# Patient Record
Sex: Female | Born: 1960
Health system: Southern US, Community
[De-identification: ages and names within clinical notes are randomized; demographics above are authoritative.]

## PROBLEM LIST (undated history)

## (undated) DIAGNOSIS — J45909 Unspecified asthma, uncomplicated: Secondary | ICD-10-CM

## (undated) DIAGNOSIS — D696 Thrombocytopenia, unspecified: Secondary | ICD-10-CM

## (undated) DIAGNOSIS — K922 Gastrointestinal hemorrhage, unspecified: Secondary | ICD-10-CM

## (undated) DIAGNOSIS — I2699 Other pulmonary embolism without acute cor pulmonale: Secondary | ICD-10-CM

## (undated) DIAGNOSIS — K219 Gastro-esophageal reflux disease without esophagitis: Secondary | ICD-10-CM

## (undated) DIAGNOSIS — F419 Anxiety disorder, unspecified: Secondary | ICD-10-CM

## (undated) DIAGNOSIS — E785 Hyperlipidemia, unspecified: Secondary | ICD-10-CM

## (undated) DIAGNOSIS — I82409 Acute embolism and thrombosis of unspecified deep veins of unspecified lower extremity: Secondary | ICD-10-CM

## (undated) DIAGNOSIS — A0472 Enterocolitis due to Clostridium difficile, not specified as recurrent: Secondary | ICD-10-CM

## (undated) DIAGNOSIS — M1990 Unspecified osteoarthritis, unspecified site: Secondary | ICD-10-CM

## (undated) DIAGNOSIS — M545 Low back pain, unspecified: Secondary | ICD-10-CM

## (undated) DIAGNOSIS — G629 Polyneuropathy, unspecified: Secondary | ICD-10-CM

## (undated) DIAGNOSIS — K5090 Crohn's disease, unspecified, without complications: Secondary | ICD-10-CM

## (undated) DIAGNOSIS — H409 Unspecified glaucoma: Secondary | ICD-10-CM

## (undated) DIAGNOSIS — Z720 Tobacco use: Secondary | ICD-10-CM

## (undated) DIAGNOSIS — E039 Hypothyroidism, unspecified: Secondary | ICD-10-CM

## (undated) DIAGNOSIS — I10 Essential (primary) hypertension: Secondary | ICD-10-CM

## (undated) DIAGNOSIS — E119 Type 2 diabetes mellitus without complications: Secondary | ICD-10-CM

## (undated) DIAGNOSIS — R7989 Other specified abnormal findings of blood chemistry: Secondary | ICD-10-CM

## (undated) DIAGNOSIS — G8929 Other chronic pain: Secondary | ICD-10-CM

## (undated) DIAGNOSIS — K859 Acute pancreatitis without necrosis or infection, unspecified: Secondary | ICD-10-CM

---

## 2000-10-10 ENCOUNTER — Encounter: Payer: Self-pay | Admitting: Family Medicine

## 2000-10-10 ENCOUNTER — Ambulatory Visit (HOSPITAL_COMMUNITY): Admission: RE | Admit: 2000-10-10 | Discharge: 2000-10-10 | Payer: Self-pay | Admitting: Family Medicine

## 2000-10-11 ENCOUNTER — Inpatient Hospital Stay (HOSPITAL_COMMUNITY): Admission: EM | Admit: 2000-10-11 | Discharge: 2000-10-12 | Payer: Self-pay | Admitting: Emergency Medicine

## 2000-10-12 ENCOUNTER — Encounter: Payer: Self-pay | Admitting: *Deleted

## 2002-01-15 ENCOUNTER — Ambulatory Visit (HOSPITAL_COMMUNITY): Admission: RE | Admit: 2002-01-15 | Discharge: 2002-01-15 | Payer: Self-pay | Admitting: Family Medicine

## 2002-09-01 ENCOUNTER — Encounter: Payer: Self-pay | Admitting: *Deleted

## 2002-09-01 ENCOUNTER — Emergency Department (HOSPITAL_COMMUNITY): Admission: EM | Admit: 2002-09-01 | Discharge: 2002-09-01 | Payer: Self-pay | Admitting: *Deleted

## 2002-11-09 ENCOUNTER — Encounter: Payer: Self-pay | Admitting: Emergency Medicine

## 2002-11-10 ENCOUNTER — Encounter: Payer: Self-pay | Admitting: Emergency Medicine

## 2002-11-10 ENCOUNTER — Inpatient Hospital Stay (HOSPITAL_COMMUNITY): Admission: EM | Admit: 2002-11-10 | Discharge: 2002-11-14 | Payer: Self-pay | Admitting: Emergency Medicine

## 2002-11-12 ENCOUNTER — Encounter: Payer: Self-pay | Admitting: *Deleted

## 2002-11-12 ENCOUNTER — Encounter (INDEPENDENT_AMBULATORY_CARE_PROVIDER_SITE_OTHER): Payer: Self-pay | Admitting: Specialist

## 2002-12-21 ENCOUNTER — Encounter: Payer: Self-pay | Admitting: Emergency Medicine

## 2002-12-22 ENCOUNTER — Encounter: Payer: Self-pay | Admitting: *Deleted

## 2002-12-22 ENCOUNTER — Inpatient Hospital Stay (HOSPITAL_COMMUNITY): Admission: EM | Admit: 2002-12-22 | Discharge: 2003-01-02 | Payer: Self-pay | Admitting: Emergency Medicine

## 2002-12-24 ENCOUNTER — Encounter: Payer: Self-pay | Admitting: *Deleted

## 2003-01-18 ENCOUNTER — Ambulatory Visit (HOSPITAL_COMMUNITY): Admission: RE | Admit: 2003-01-18 | Discharge: 2003-01-18 | Payer: Self-pay | Admitting: Family Medicine

## 2003-02-03 ENCOUNTER — Encounter: Admission: RE | Admit: 2003-02-03 | Discharge: 2003-02-03 | Payer: Self-pay | Admitting: Family Medicine

## 2003-02-03 ENCOUNTER — Encounter: Payer: Self-pay | Admitting: Family Medicine

## 2003-03-26 ENCOUNTER — Encounter: Payer: Self-pay | Admitting: Cardiology

## 2003-03-26 ENCOUNTER — Ambulatory Visit (HOSPITAL_COMMUNITY): Admission: RE | Admit: 2003-03-26 | Discharge: 2003-03-26 | Payer: Self-pay | Admitting: Internal Medicine

## 2003-08-18 ENCOUNTER — Other Ambulatory Visit: Admission: RE | Admit: 2003-08-18 | Discharge: 2003-08-18 | Payer: Self-pay | Admitting: Family Medicine

## 2003-09-10 ENCOUNTER — Encounter: Admission: RE | Admit: 2003-09-10 | Discharge: 2003-09-10 | Payer: Self-pay | Admitting: Family Medicine

## 2003-09-30 ENCOUNTER — Inpatient Hospital Stay (HOSPITAL_COMMUNITY): Admission: EM | Admit: 2003-09-30 | Discharge: 2003-10-05 | Payer: Self-pay | Admitting: Emergency Medicine

## 2003-10-26 ENCOUNTER — Inpatient Hospital Stay (HOSPITAL_COMMUNITY): Admission: EM | Admit: 2003-10-26 | Discharge: 2003-11-02 | Payer: Self-pay | Admitting: *Deleted

## 2003-11-22 ENCOUNTER — Emergency Department (HOSPITAL_COMMUNITY): Admission: EM | Admit: 2003-11-22 | Discharge: 2003-11-23 | Payer: Self-pay | Admitting: Emergency Medicine

## 2003-12-21 ENCOUNTER — Encounter: Admission: RE | Admit: 2003-12-21 | Discharge: 2004-03-20 | Payer: Self-pay | Admitting: *Deleted

## 2004-05-24 ENCOUNTER — Inpatient Hospital Stay (HOSPITAL_COMMUNITY): Admission: EM | Admit: 2004-05-24 | Discharge: 2004-06-07 | Payer: Self-pay | Admitting: Emergency Medicine

## 2004-05-29 ENCOUNTER — Encounter (INDEPENDENT_AMBULATORY_CARE_PROVIDER_SITE_OTHER): Payer: Self-pay | Admitting: Specialist

## 2004-06-16 ENCOUNTER — Ambulatory Visit: Payer: Self-pay | Admitting: Nurse Practitioner

## 2004-10-24 ENCOUNTER — Ambulatory Visit: Payer: Self-pay | Admitting: Nurse Practitioner

## 2005-01-24 ENCOUNTER — Ambulatory Visit: Payer: Self-pay | Admitting: Nurse Practitioner

## 2005-01-29 ENCOUNTER — Other Ambulatory Visit: Admission: RE | Admit: 2005-01-29 | Discharge: 2005-01-29 | Payer: Self-pay | Admitting: Family Medicine

## 2005-01-29 ENCOUNTER — Ambulatory Visit: Payer: Self-pay | Admitting: Nurse Practitioner

## 2005-02-19 ENCOUNTER — Ambulatory Visit (HOSPITAL_COMMUNITY): Admission: RE | Admit: 2005-02-19 | Discharge: 2005-02-19 | Payer: Self-pay | Admitting: Family Medicine

## 2005-03-28 ENCOUNTER — Ambulatory Visit: Payer: Self-pay | Admitting: Nurse Practitioner

## 2005-07-04 ENCOUNTER — Encounter: Admission: RE | Admit: 2005-07-04 | Discharge: 2005-07-04 | Payer: Self-pay | Admitting: Gastroenterology

## 2005-07-26 ENCOUNTER — Encounter: Admission: RE | Admit: 2005-07-26 | Discharge: 2005-07-26 | Payer: Self-pay | Admitting: Gastroenterology

## 2005-10-31 ENCOUNTER — Ambulatory Visit: Payer: Self-pay | Admitting: Nurse Practitioner

## 2005-12-14 ENCOUNTER — Ambulatory Visit: Payer: Self-pay | Admitting: Nurse Practitioner

## 2005-12-17 ENCOUNTER — Ambulatory Visit: Payer: Self-pay | Admitting: Nurse Practitioner

## 2005-12-21 ENCOUNTER — Ambulatory Visit: Payer: Self-pay | Admitting: Nurse Practitioner

## 2006-02-27 ENCOUNTER — Ambulatory Visit: Payer: Self-pay | Admitting: Internal Medicine

## 2006-03-05 ENCOUNTER — Ambulatory Visit: Payer: Self-pay | Admitting: Nurse Practitioner

## 2006-03-07 ENCOUNTER — Ambulatory Visit: Payer: Self-pay | Admitting: Nurse Practitioner

## 2006-04-16 ENCOUNTER — Ambulatory Visit: Payer: Self-pay | Admitting: Nurse Practitioner

## 2006-04-16 ENCOUNTER — Emergency Department (HOSPITAL_COMMUNITY): Admission: EM | Admit: 2006-04-16 | Discharge: 2006-04-17 | Payer: Self-pay | Admitting: Emergency Medicine

## 2006-11-25 ENCOUNTER — Ambulatory Visit: Payer: Self-pay | Admitting: Nurse Practitioner

## 2007-02-21 ENCOUNTER — Encounter (INDEPENDENT_AMBULATORY_CARE_PROVIDER_SITE_OTHER): Payer: Self-pay | Admitting: Nurse Practitioner

## 2007-02-21 ENCOUNTER — Ambulatory Visit: Payer: Self-pay | Admitting: Internal Medicine

## 2007-02-21 ENCOUNTER — Other Ambulatory Visit: Admission: RE | Admit: 2007-02-21 | Discharge: 2007-02-21 | Payer: Self-pay | Admitting: Internal Medicine

## 2007-04-12 ENCOUNTER — Inpatient Hospital Stay (HOSPITAL_COMMUNITY): Admission: EM | Admit: 2007-04-12 | Discharge: 2007-04-16 | Payer: Self-pay | Admitting: Emergency Medicine

## 2007-04-17 ENCOUNTER — Ambulatory Visit: Payer: Self-pay | Admitting: Family Medicine

## 2007-04-18 ENCOUNTER — Ambulatory Visit: Payer: Self-pay | Admitting: Internal Medicine

## 2007-04-18 ENCOUNTER — Ambulatory Visit (HOSPITAL_COMMUNITY): Admission: RE | Admit: 2007-04-18 | Discharge: 2007-04-18 | Payer: Self-pay | Admitting: Internal Medicine

## 2007-05-12 DIAGNOSIS — F32A Depression, unspecified: Secondary | ICD-10-CM | POA: Insufficient documentation

## 2007-05-12 DIAGNOSIS — K7460 Unspecified cirrhosis of liver: Secondary | ICD-10-CM | POA: Insufficient documentation

## 2007-05-12 DIAGNOSIS — J309 Allergic rhinitis, unspecified: Secondary | ICD-10-CM | POA: Insufficient documentation

## 2007-05-12 DIAGNOSIS — F329 Major depressive disorder, single episode, unspecified: Secondary | ICD-10-CM | POA: Insufficient documentation

## 2007-05-12 DIAGNOSIS — D509 Iron deficiency anemia, unspecified: Secondary | ICD-10-CM

## 2007-05-12 DIAGNOSIS — J45909 Unspecified asthma, uncomplicated: Secondary | ICD-10-CM | POA: Insufficient documentation

## 2007-05-12 DIAGNOSIS — G629 Polyneuropathy, unspecified: Secondary | ICD-10-CM

## 2007-05-12 DIAGNOSIS — I10 Essential (primary) hypertension: Secondary | ICD-10-CM | POA: Insufficient documentation

## 2007-05-12 DIAGNOSIS — Z8719 Personal history of other diseases of the digestive system: Secondary | ICD-10-CM | POA: Insufficient documentation

## 2007-05-12 DIAGNOSIS — E118 Type 2 diabetes mellitus with unspecified complications: Secondary | ICD-10-CM | POA: Insufficient documentation

## 2007-05-12 DIAGNOSIS — F411 Generalized anxiety disorder: Secondary | ICD-10-CM | POA: Insufficient documentation

## 2007-06-17 ENCOUNTER — Encounter (INDEPENDENT_AMBULATORY_CARE_PROVIDER_SITE_OTHER): Payer: Self-pay | Admitting: Nurse Practitioner

## 2007-06-17 ENCOUNTER — Ambulatory Visit: Payer: Self-pay | Admitting: Internal Medicine

## 2007-06-19 ENCOUNTER — Ambulatory Visit: Payer: Self-pay | Admitting: Internal Medicine

## 2008-01-26 ENCOUNTER — Encounter (INDEPENDENT_AMBULATORY_CARE_PROVIDER_SITE_OTHER): Payer: Self-pay | Admitting: Nurse Practitioner

## 2008-01-26 ENCOUNTER — Ambulatory Visit: Payer: Self-pay | Admitting: Internal Medicine

## 2008-01-26 LAB — CONVERTED CEMR LAB
AST: 18 units/L (ref 0–37)
BUN: 16 mg/dL (ref 6–23)
Basophils Absolute: 0 10*3/uL (ref 0.0–0.1)
Calcium: 9.6 mg/dL (ref 8.4–10.5)
Creatinine, Ser: 0.82 mg/dL (ref 0.40–1.20)
Eosinophils Absolute: 0.4 10*3/uL (ref 0.0–0.7)
HCT: 38.3 % (ref 36.0–46.0)
HDL: 51 mg/dL (ref >39)
Hemoglobin: 12.4 g/dL (ref 12.0–15.0)
Lymphocytes Relative: 31 % (ref 12–46)
MCHC: 32.4 g/dL (ref 30.0–36.0)
MCV: 92.3 fL (ref 78.0–100.0)
Monocytes Absolute: 0.7 10*3/uL (ref 0.1–1.0)
Neutrophils Relative %: 61 % (ref 43–77)
Platelets: 332 10*3/uL (ref 150–400)
RBC: 4.15 M/uL (ref 3.87–5.11)
RDW: 14.5 % (ref 11.5–15.5)
Sodium: 142 meq/L (ref 135–145)
Total Bilirubin: 0.4 mg/dL (ref 0.3–1.2)
Total CHOL/HDL Ratio: 3.1
Triglycerides: 115 mg/dL (ref <150)
VLDL: 23 mg/dL (ref 0–40)

## 2008-08-11 ENCOUNTER — Ambulatory Visit (HOSPITAL_COMMUNITY): Admission: RE | Admit: 2008-08-11 | Discharge: 2008-08-11 | Payer: Self-pay | Admitting: Interventional Cardiology

## 2008-12-17 ENCOUNTER — Inpatient Hospital Stay (HOSPITAL_COMMUNITY): Admission: AD | Admit: 2008-12-17 | Discharge: 2008-12-22 | Payer: Self-pay | Admitting: Gastroenterology

## 2009-03-26 ENCOUNTER — Inpatient Hospital Stay (HOSPITAL_COMMUNITY): Admission: EM | Admit: 2009-03-26 | Discharge: 2009-03-31 | Payer: Self-pay | Admitting: Emergency Medicine

## 2009-05-02 ENCOUNTER — Encounter (INDEPENDENT_AMBULATORY_CARE_PROVIDER_SITE_OTHER): Payer: Self-pay | Admitting: Internal Medicine

## 2009-05-02 ENCOUNTER — Ambulatory Visit: Payer: Self-pay | Admitting: Family Medicine

## 2009-05-02 LAB — CONVERTED CEMR LAB
Albumin: 3.9 g/dL (ref 3.5–5.2)
Alkaline Phosphatase: 92 units/L (ref 39–117)
BUN: 10 mg/dL (ref 6–23)
CO2: 24 meq/L (ref 19–32)
Calcium: 9.2 mg/dL (ref 8.4–10.5)
Sodium: 139 meq/L (ref 135–145)
Total Bilirubin: 0.4 mg/dL (ref 0.3–1.2)

## 2009-10-13 ENCOUNTER — Ambulatory Visit: Payer: Self-pay | Admitting: Internal Medicine

## 2009-10-18 ENCOUNTER — Emergency Department (HOSPITAL_COMMUNITY): Admission: EM | Admit: 2009-10-18 | Discharge: 2009-10-18 | Payer: Self-pay | Admitting: Emergency Medicine

## 2010-01-23 ENCOUNTER — Ambulatory Visit: Payer: Self-pay | Admitting: Internal Medicine

## 2010-08-02 ENCOUNTER — Encounter (INDEPENDENT_AMBULATORY_CARE_PROVIDER_SITE_OTHER): Payer: Self-pay | Admitting: Internal Medicine

## 2010-08-02 LAB — CONVERTED CEMR LAB
Basophils Absolute: 0.1 10*3/uL (ref 0.0–0.1)
Basophils Relative: 0 % (ref 0–1)
Eosinophils Absolute: 0.2 10*3/uL (ref 0.0–0.7)
Eosinophils Relative: 2 % (ref 0–5)
Lymphs Abs: 4.1 10*3/uL — ABNORMAL HIGH (ref 0.7–4.0)
MCHC: 31.5 g/dL (ref 30.0–36.0)
Monocytes Absolute: 0.7 10*3/uL (ref 0.1–1.0)
Neutro Abs: 8.7 10*3/uL — ABNORMAL HIGH (ref 1.7–7.7)
Platelets: 384 10*3/uL (ref 150–400)
RDW: 16 % — ABNORMAL HIGH (ref 11.5–15.5)
Sed Rate: 14 mm/hr (ref 0–22)
WBC: 13.7 10*3/uL — ABNORMAL HIGH (ref 4.0–10.5)

## 2010-10-22 ENCOUNTER — Encounter: Payer: Self-pay | Admitting: Otolaryngology

## 2010-10-22 ENCOUNTER — Encounter: Payer: Self-pay | Admitting: Internal Medicine

## 2010-10-22 ENCOUNTER — Encounter: Payer: Self-pay | Admitting: Orthopedic Surgery

## 2010-11-04 ENCOUNTER — Encounter: Payer: Self-pay | Admitting: Gastroenterology

## 2010-11-11 ENCOUNTER — Emergency Department (HOSPITAL_COMMUNITY)
Admission: EM | Admit: 2010-11-11 | Discharge: 2010-11-12 | Disposition: A | Discharge status: Home / Self Care | Payer: Medicaid Other | Attending: Emergency Medicine | Admitting: Emergency Medicine

## 2010-11-11 ENCOUNTER — Emergency Department (HOSPITAL_COMMUNITY): Payer: Medicaid Other

## 2010-11-11 DIAGNOSIS — G8929 Other chronic pain: Secondary | ICD-10-CM | POA: Insufficient documentation

## 2010-11-11 DIAGNOSIS — Y92009 Unspecified place in unspecified non-institutional (private) residence as the place of occurrence of the external cause: Secondary | ICD-10-CM | POA: Insufficient documentation

## 2010-11-11 DIAGNOSIS — W19XXXA Unspecified fall, initial encounter: Secondary | ICD-10-CM | POA: Insufficient documentation

## 2010-11-11 DIAGNOSIS — S20229A Contusion of unspecified back wall of thorax, initial encounter: Secondary | ICD-10-CM | POA: Insufficient documentation

## 2010-11-11 DIAGNOSIS — I10 Essential (primary) hypertension: Secondary | ICD-10-CM | POA: Insufficient documentation

## 2010-11-11 DIAGNOSIS — E119 Type 2 diabetes mellitus without complications: Secondary | ICD-10-CM | POA: Insufficient documentation

## 2010-11-11 DIAGNOSIS — Z79899 Other long term (current) drug therapy: Secondary | ICD-10-CM | POA: Insufficient documentation

## 2010-11-11 DIAGNOSIS — F17200 Nicotine dependence, unspecified, uncomplicated: Secondary | ICD-10-CM | POA: Insufficient documentation

## 2010-11-11 LAB — CBC
HCT: 45.6 % (ref 36.0–46.0)
Hemoglobin: 15.8 g/dL — ABNORMAL HIGH (ref 12.0–15.0)
MCV: 87.9 fL (ref 78.0–100.0)
RBC: 5.19 MIL/uL — ABNORMAL HIGH (ref 3.87–5.11)
WBC: 14.8 10*3/uL — ABNORMAL HIGH (ref 4.0–10.5)

## 2010-11-11 LAB — DIFFERENTIAL
Basophils Absolute: 0.1 10*3/uL (ref 0.0–0.1)
Lymphocytes Relative: 23 % (ref 12–46)
Lymphs Abs: 3.4 10*3/uL (ref 0.7–4.0)
Neutro Abs: 9.8 10*3/uL — ABNORMAL HIGH (ref 1.7–7.7)
Neutrophils Relative %: 66 % (ref 43–77)

## 2010-11-11 LAB — BASIC METABOLIC PANEL
CO2: 23 mEq/L (ref 19–32)
GFR calc Af Amer: 34 mL/min — ABNORMAL LOW (ref >60)
Glucose, Bld: 162 mg/dL — ABNORMAL HIGH (ref 70–99)
Potassium: 3.7 mEq/L (ref 3.5–5.1)
Sodium: 137 mEq/L (ref 135–145)

## 2010-12-17 LAB — DIFFERENTIAL
Basophils Absolute: 0 10*3/uL (ref 0.0–0.1)
Eosinophils Relative: 0 % (ref 0–5)
Lymphocytes Relative: 10 % — ABNORMAL LOW (ref 12–46)
Lymphs Abs: 1.4 10*3/uL (ref 0.7–4.0)
Monocytes Absolute: 0.3 10*3/uL (ref 0.1–1.0)
Monocytes Relative: 2 % — ABNORMAL LOW (ref 3–12)
Neutro Abs: 12.5 10*3/uL — ABNORMAL HIGH (ref 1.7–7.7)

## 2010-12-17 LAB — URINALYSIS, ROUTINE W REFLEX MICROSCOPIC
Hgb urine dipstick: NEGATIVE
Nitrite: NEGATIVE
Specific Gravity, Urine: 1.021 (ref 1.005–1.030)
Urobilinogen, UA: 0.2 mg/dL (ref 0.0–1.0)
pH: 5.5 (ref 5.0–8.0)

## 2010-12-17 LAB — POCT CARDIAC MARKERS
CKMB, poc: 4 ng/mL (ref 1.0–8.0)
CKMB, poc: 5.6 ng/mL (ref 1.0–8.0)
Myoglobin, poc: 319 ng/mL (ref 12–200)
Myoglobin, poc: 432 ng/mL (ref 12–200)
Troponin i, poc: <0.05 ng/mL (ref 0.00–0.09)

## 2010-12-17 LAB — CBC
HCT: 42.8 % (ref 36.0–46.0)
MCV: 91 fL (ref 78.0–100.0)
Platelets: 295 10*3/uL (ref 150–400)
RDW: 14.4 % (ref 11.5–15.5)
WBC: 14.2 10*3/uL — ABNORMAL HIGH (ref 4.0–10.5)

## 2010-12-17 LAB — URINE MICROSCOPIC-ADD ON

## 2010-12-17 LAB — COMPREHENSIVE METABOLIC PANEL
AST: 27 U/L (ref 0–37)
Albumin: 4.7 g/dL (ref 3.5–5.2)
BUN: 24 mg/dL — ABNORMAL HIGH (ref 6–23)
Calcium: 9.6 mg/dL (ref 8.4–10.5)
Chloride: 97 mEq/L (ref 96–112)
Creatinine, Ser: 1.11 mg/dL (ref 0.4–1.2)
GFR calc Af Amer: >60 mL/min (ref >60)
Total Protein: 8.9 g/dL — ABNORMAL HIGH (ref 6.0–8.3)

## 2011-01-07 LAB — GLUCOSE, CAPILLARY: Glucose-Capillary: 108 mg/dL — ABNORMAL HIGH (ref 70–99)

## 2011-01-08 LAB — CBC
HCT: 30.5 % — ABNORMAL LOW (ref 36.0–46.0)
HCT: 33.2 % — ABNORMAL LOW (ref 36.0–46.0)
HCT: 33.3 % — ABNORMAL LOW (ref 36.0–46.0)
HCT: 40.2 % (ref 36.0–46.0)
Hemoglobin: 10.9 g/dL — ABNORMAL LOW (ref 12.0–15.0)
Hemoglobin: 11.1 g/dL — ABNORMAL LOW (ref 12.0–15.0)
MCHC: 33.2 g/dL (ref 30.0–36.0)
MCV: 90.8 fL (ref 78.0–100.0)
MCV: 91.7 fL (ref 78.0–100.0)
Platelets: 269 10*3/uL (ref 150–400)
Platelets: 334 10*3/uL (ref 150–400)
RBC: 3.33 MIL/uL — ABNORMAL LOW (ref 3.87–5.11)
RBC: 3.64 MIL/uL — ABNORMAL LOW (ref 3.87–5.11)
RBC: 3.66 MIL/uL — ABNORMAL LOW (ref 3.87–5.11)
RBC: 4.38 MIL/uL (ref 3.87–5.11)
WBC: 12.4 10*3/uL — ABNORMAL HIGH (ref 4.0–10.5)
WBC: 7.7 10*3/uL (ref 4.0–10.5)
WBC: 9.3 10*3/uL (ref 4.0–10.5)

## 2011-01-08 LAB — URINALYSIS, ROUTINE W REFLEX MICROSCOPIC
Glucose, UA: NEGATIVE mg/dL
Ketones, ur: 15 mg/dL — AB
pH: 5 (ref 5.0–8.0)

## 2011-01-08 LAB — GLUCOSE, CAPILLARY
Glucose-Capillary: 111 mg/dL — ABNORMAL HIGH (ref 70–99)
Glucose-Capillary: 115 mg/dL — ABNORMAL HIGH (ref 70–99)
Glucose-Capillary: 125 mg/dL — ABNORMAL HIGH (ref 70–99)
Glucose-Capillary: 157 mg/dL — ABNORMAL HIGH (ref 70–99)
Glucose-Capillary: 210 mg/dL — ABNORMAL HIGH (ref 70–99)
Glucose-Capillary: 69 mg/dL — ABNORMAL LOW (ref 70–99)
Glucose-Capillary: 76 mg/dL (ref 70–99)
Glucose-Capillary: 83 mg/dL (ref 70–99)
Glucose-Capillary: 96 mg/dL (ref 70–99)

## 2011-01-08 LAB — DIFFERENTIAL
Basophils Relative: 0 % (ref 0–1)
Eosinophils Absolute: 0.2 10*3/uL (ref 0.0–0.7)
Eosinophils Relative: 1 % (ref 0–5)
Lymphocytes Relative: 24 % (ref 12–46)
Lymphs Abs: 1.7 10*3/uL (ref 0.7–4.0)
Lymphs Abs: 2.9 10*3/uL (ref 0.7–4.0)
Monocytes Relative: 3 % (ref 3–12)
Neutrophils Relative %: 69 % (ref 43–77)

## 2011-01-08 LAB — BASIC METABOLIC PANEL
BUN: 16 mg/dL (ref 6–23)
Chloride: 116 mEq/L — ABNORMAL HIGH (ref 96–112)
Chloride: 120 mEq/L — ABNORMAL HIGH (ref 96–112)
GFR calc Af Amer: 51 mL/min — ABNORMAL LOW (ref >60)
Potassium: 4.4 mEq/L (ref 3.5–5.1)
Potassium: 4.8 mEq/L (ref 3.5–5.1)
Sodium: 143 mEq/L (ref 135–145)

## 2011-01-08 LAB — HEMOGLOBIN A1C
Hgb A1c MFr Bld: 5.5 % (ref 4.6–6.1)
Mean Plasma Glucose: 111 mg/dL

## 2011-01-08 LAB — URINE CULTURE

## 2011-01-08 LAB — POCT I-STAT, CHEM 8
BUN: 45 mg/dL — ABNORMAL HIGH (ref 6–23)
Chloride: 110 mEq/L (ref 96–112)
Creatinine, Ser: 2.9 mg/dL — ABNORMAL HIGH (ref 0.4–1.2)
Potassium: 5 mEq/L (ref 3.5–5.1)
Sodium: 138 mEq/L (ref 135–145)

## 2011-01-08 LAB — TSH: TSH: 0.518 u[IU]/mL (ref 0.350–4.500)

## 2011-01-08 LAB — LIPID PANEL
Cholesterol: 180 mg/dL (ref 0–200)
Total CHOL/HDL Ratio: 6 RATIO

## 2011-01-08 LAB — COMPREHENSIVE METABOLIC PANEL
ALT: 35 U/L (ref 0–35)
Alkaline Phosphatase: 76 U/L (ref 39–117)
BUN: 29 mg/dL — ABNORMAL HIGH (ref 6–23)
CO2: 17 mEq/L — ABNORMAL LOW (ref 19–32)
Calcium: 8.5 mg/dL (ref 8.4–10.5)
GFR calc non Af Amer: 26 mL/min — ABNORMAL LOW (ref >60)
Glucose, Bld: 92 mg/dL (ref 70–99)
Sodium: 144 mEq/L (ref 135–145)

## 2011-01-08 LAB — URINE MICROSCOPIC-ADD ON

## 2011-01-11 LAB — DIFFERENTIAL
Basophils Relative: 1 % (ref 0–1)
Lymphs Abs: 4.6 10*3/uL — ABNORMAL HIGH (ref 0.7–4.0)
Monocytes Absolute: 0.5 10*3/uL (ref 0.1–1.0)
Monocytes Relative: 4 % (ref 3–12)
Neutro Abs: 6.6 10*3/uL (ref 1.7–7.7)

## 2011-01-11 LAB — GLUCOSE, CAPILLARY
Glucose-Capillary: 100 mg/dL — ABNORMAL HIGH (ref 70–99)
Glucose-Capillary: 106 mg/dL — ABNORMAL HIGH (ref 70–99)
Glucose-Capillary: 107 mg/dL — ABNORMAL HIGH (ref 70–99)
Glucose-Capillary: 107 mg/dL — ABNORMAL HIGH (ref 70–99)
Glucose-Capillary: 108 mg/dL — ABNORMAL HIGH (ref 70–99)
Glucose-Capillary: 112 mg/dL — ABNORMAL HIGH (ref 70–99)
Glucose-Capillary: 115 mg/dL — ABNORMAL HIGH (ref 70–99)
Glucose-Capillary: 115 mg/dL — ABNORMAL HIGH (ref 70–99)
Glucose-Capillary: 117 mg/dL — ABNORMAL HIGH (ref 70–99)
Glucose-Capillary: 120 mg/dL — ABNORMAL HIGH (ref 70–99)
Glucose-Capillary: 127 mg/dL — ABNORMAL HIGH (ref 70–99)
Glucose-Capillary: 141 mg/dL — ABNORMAL HIGH (ref 70–99)
Glucose-Capillary: 185 mg/dL — ABNORMAL HIGH (ref 70–99)
Glucose-Capillary: 66 mg/dL — ABNORMAL LOW (ref 70–99)
Glucose-Capillary: 78 mg/dL (ref 70–99)

## 2011-01-11 LAB — COMPREHENSIVE METABOLIC PANEL
AST: 36 U/L (ref 0–37)
BUN: 6 mg/dL (ref 6–23)
CO2: 24 mEq/L (ref 19–32)
Calcium: 7.8 mg/dL — ABNORMAL LOW (ref 8.4–10.5)
Chloride: 110 mEq/L (ref 96–112)
Creatinine, Ser: 0.85 mg/dL (ref 0.4–1.2)
GFR calc Af Amer: >60 mL/min (ref >60)
GFR calc non Af Amer: >60 mL/min (ref >60)
Glucose, Bld: 57 mg/dL — ABNORMAL LOW (ref 70–99)
Total Bilirubin: 0.4 mg/dL (ref 0.3–1.2)

## 2011-01-11 LAB — CBC
HCT: 36.5 % (ref 36.0–46.0)
Hemoglobin: 13.9 g/dL (ref 12.0–15.0)
MCHC: 33.1 g/dL (ref 30.0–36.0)
MCHC: 33.9 g/dL (ref 30.0–36.0)
MCV: 88.4 fL (ref 78.0–100.0)
MCV: 90.5 fL (ref 78.0–100.0)
Platelets: 201 10*3/uL (ref 150–400)
RBC: 4.03 MIL/uL (ref 3.87–5.11)
RBC: 4.62 MIL/uL (ref 3.87–5.11)
WBC: 11.9 10*3/uL — ABNORMAL HIGH (ref 4.0–10.5)
WBC: 12.3 10*3/uL — ABNORMAL HIGH (ref 4.0–10.5)

## 2011-01-11 LAB — HEMOGLOBIN A1C: Hgb A1c MFr Bld: 5.6 % (ref 4.6–6.1)

## 2011-01-11 LAB — BASIC METABOLIC PANEL
BUN: 10 mg/dL (ref 6–23)
CO2: 24 mEq/L (ref 19–32)
Chloride: 114 mEq/L — ABNORMAL HIGH (ref 96–112)
Creatinine, Ser: 0.74 mg/dL (ref 0.4–1.2)
Potassium: 4.1 mEq/L (ref 3.5–5.1)

## 2011-02-13 NOTE — Op Note (Signed)
NAME:  [name], [name]                ACCOUNT NO.:  [id number]   MEDICAL RECORD NO.:  [id number]          PATIENT TYPE:  INP   LOCATION:  5529                         FACILITY:  [facility]   PHYSICIAN:  [name], M.D.   DATE OF BIRTH:  Sep 13, 1961   DATE OF PROCEDURE:  04/15/2007  DATE OF DISCHARGE:                               OPERATIVE REPORT   PROBLEM:  1. Resolved hematochezia.  2. Normocytic anemia.  3. 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   HISTORY:  Ms. [name] is a 50 year old female born November 03, 1960.  Ms. [name] was admitted to the hospital to evaluate and treat  hematochezia abdominal cramps, chills, and symptomatic normocytic anemia  (hemoglobin 5.7 grams).   In February 2004 proctocolonoscopy to the cecum revealed universal  proctocolitis with probable Crohn's disease.  January 2005 rectal  abscess was drained surgically.   Ms. [name] hemoglobin rose to 11 grams after 4 units of packed red blood  cell transfusions.  Ms. [name] was placed on Cipro and Flagyl in addition  to her chronic Azulfidine.  Admission CT scan of the abdomen and pelvis  without contrast was unremarkable.   Ms. [name] has gastroparesis by August 2005 gastric emptying study (59%  retention at 2 hours) associated with diabetes mellitus.   MEDICATION ALLERGIES:  PENICILLIN and IV CONTRAST.   PAST MEDICAL HISTORY:  1. Alcoholic pancreatitis and hepatitis.  2. Hypertension.  3. Cesarean section.  4. Surgery to remove uterine fibroid.  5. Allergic rhinitis.  6. November 10, 2002 normal esophagogastroduodenoscopy.  7. November 12, 2002 proctocolonoscopy to the cecum reveals universal      proctocolitis; probable Crohn's disease.  8. December 28, 2002 flexible proctosigmoidoscopy to 70 cm reveals      resolving proctocolitis, peripheral neuropathy depression.  9. January 2005 surgical drainage of rectal abscess.  10.Diabetes mellitus diagnosed January 2005.  11.Gastroparesis by August 2005 gastric emptying study  12.May 29, 2004 flexible proctosigmoidoscopy revealed perirectal      fistulas; ulcers and pseudopolyps were present in the sigmoid colon      and descending colon.  13.MRCP January 2005 normal.   OUTPATIENT MEDICATIONS:  Vicodin, Xanax, Protonix, amitriptyline,  Amaryl, Phenergan, Desyrel, Zestril, Neurontin, Azulfidine, potassium  chloride, MiraLax, metformin, Crestor, aspirin, and multivitamin.   FAMILY HISTORY:  Positive for diabetes mellitus.   HABITS:  Ms. [name] quit consuming alcohol in 2005.  She smokes 1/2 pack  of cigarettes per day.   ENDOSCOPIST:  [name], M.D.   PREMEDICATION:  Fentanyl 100 mcg.  Versed 10 mg.   PROCEDURE:  After obtaining informed consent Ms. [name] was placed in the  left lateral decubitus position.  I administered intravenous fentanyl  and intravenous Versed to achieve conscious sedation for the procedure.  The patient's blood pressure, oxygen saturation, and cardiac rhythm were  monitored throughout the procedure and documented in the medical record.   Anal inspection and digital rectal exam were normal.  The Pentax  pediatric colonoscope was introduced into the rectum; and with some  degree of difficulty due to colonic loop formation eventually advanced  to the cecum.  A normal-appearing appendiceal orifice was identified.  Colonic preparation for the exam today was quite poor.   Endoscopic appearance of the rectum and colon with the endoscope  advanced to the cecum reveals inactive Crohn's proctocolitis.  There are  a few scattered aphthous type ulcers.  There are many pseudopolyps  throughout the colon.  The mucosa is intact; and is nonfriable.   ASSESSMENT:  Inactive Crohn's proctocolitis.   RECOMMENDATIONS:  1. Ms. [name] should discontinue smoking and stop aspirin due to its      deleterious effects on exacerbating Crohn's disease.  2. Discontinue Cipro and Flagyl.  3.  Start regular diet.           ______________________________  [name], M.D.     MJ/MEDQ  D:  04/15/2007  T:  04/16/2007  Job:  [id number]

## 2011-02-13 NOTE — H&P (Signed)
NAME:  [name], [name]                ACCOUNT NO.:  [id number]   MEDICAL RECORD NO.:  [id number]          PATIENT TYPE:  INP   LOCATION:  6732                         FACILITY:  [facility]   PHYSICIAN:  Oswald Hillock, MD        DATE OF BIRTH:  1961-04-22   DATE OF ADMISSION:  03/26/2009  DATE OF DISCHARGE:                              HISTORY & PHYSICAL   CHIEF COMPLAINT:  Generalized weakness/decreased appetite.   HISTORY OF PRESENT ILLNESS:  The patient is a 50 year old African  American female with known history of diabetes mellitus, depression,  hypertension, and Crohn proctocolitis who presents to the emergency room  with a 2-day history of decreased p.o. intake and nausea but no  vomiting.  She denies any abdominal pain.  No chest pain or shortness of  breath, palpitations, dizziness, loss of consciousness, or any focal  weakness of any part of the body, no bleeding PR, no black tarry stools.  The patient was admitted last in March 2010 with persistent nausea,  vomiting lasting 5 days at that time.   EMERGENCY ROOM COURSE:  Initial eval in the ER revealed the patient was  in acute renal failure with a BUN of 45 and creatinine of 2.9.  Significantly, her potassium was 5 and EKG did not show any changes.   Past medical history is significant for:  1. Diabetes mellitus.  2. Gastroparesis.  3. Universal Crohn proctocolitis, diagnosed by colonoscopy on November 12, 2002.  4. Inactive Crohn colitis by colonoscopy on April 15, 2007.  5. Depression.  6. Allergic rhinitis.  7. Peripheral neuropathy.   PAST SURGICAL HISTORY:  1. History of upper as well as lower GI endoscopies.  2. History of MRI/MR cholangiopancreatograph in 2005.  3. Cesarean section.  4. Uterine fibroid removal.   CURRENT MEDICATIONS:  Based on the discharge summary dated December 21, 2008, she is currently on:  1. Neurontin 800 mg 3 times a day.  2. Crestor 10 mg daily.  3. Protonix 40 mg daily.  4. Trazodone  25/250 at bedtime.  5. Xanax 1 mg q.8 h. P.r.n.  6. Multivitamin daily.  7. Vicodin 10/650 one tablet q.6 h. P.r.n.  8. Flexeril 10 mg 3 times a day.  9. Loratadine 10 mg p.r.n.  10.Oxycodone with Tylenol q.6 p.r.n.   ALLERGIES:  PENICILLIN and IV CONTRAST.   SOCIAL HISTORY:  The patient denies any history of recent alcohol use.  Has had history of smoking.  No history of IV drug use.  Lives with  family.   FAMILY HISTORY:  No history of hypertension, diabetes mellitus, or Crohn  disease in the family.   REVIEW OF SYSTEMS:  An extensive review of systems was done.  All  systems are negative except for the positives as mentioned in the  history of present illness.  The patient denies any history of recent  weight loss.   PHYSICAL EXAMINATION:  VITAL SIGNS:  On admission, pulse 104, blood  pressure 91/68, respiratory rate of 20, temperature 96.6, O2 sats are  98% on room  air.  GENERAL:  The patient is frail, pale looking, in no acute distress,  alert and oriented to time, place, and person.  She has significant  proptosis, however, no lid lag noted.  HEENT:  No scleral icterus.  No pallor.  Ears negative.  Poor dental  hygiene.  NECK:  Supple.  No lymphadenopathy.  No JVD.  No carotid bruits.  CHEST:  Breath sounds heard bilaterally.  Good air entry.  No added  sounds.  CVS:  S1 and S2 plus regular, tachycardia.  No gallop, rub, or murmur.  ABDOMEN:  Soft, nontender, nondistended.  Bowel sounds are present.  No  abdominal bruit.  No hepatosplenomegaly.  EXTREMITIES:  No cyanosis, clubbing, or edema.  NEUROLOGIC:  Cranial nerves II through XII are grossly intact.  No focal  motor or sensory deficits noted on gross examination.   LABORATORY DATA:  WBC count 12.4, hemoglobin 12.9, hematocrit 40.2,  platelet count of 334.  Sodium 138, potassium 5.0, chloride is 110, BUN  45, creatinine 2.9, calcium of 1.23.   EKG showed normal sinus rhythm, normal PR interval, normal QRS  duration.  Poor R-wave progression in the anterior leads. Narrowing of the QRS  complex.  No tall T-wave is noted.   IMPRESSION AND PLAN:  This is a case of a 50 year old [place] American  female with a history of multiple comorbidities who presents with acute  renal failure.  1. Acute renal failure, likely prerenal.  The patient has been started      on intravenous fluids.  We will check a spot urinary sodium,      monitor her labs closely and her potassium is on the high end of      normal.  The patient does have a history of diabetes as well as      hypertension, and if there is no improvement in her renal function      or any worsening, we will need to consult Nephrology for further      workup.  2. History of gastroparesis.  The patient has not had any significant      vomiting per history.  She continues to be nauseous.  We will use      Zofran on a p.r.n. basis for now.  3. History of neuropathy.  The patient is on 800 mg of Neurontin 3      times a day.  We will decrease the dose based on her renal      function.  4. Diabetes mellitus.  We will discontinue her oral hypoglycemic, put      her on sliding scale insulin coverage with regular insulin sliding      scale.  5. Check hemoglobin A1c.  6. Hyperlipidemia.  Hold Crestor for now.  Check fasting lipid panel      and follow up.  7. History of depression.  We will continue her current medications.  8. Deep venous thrombosis/gastrointestinal prophylaxis.  Protonix and      subcu heparin.      Oswald Hillock, MD  Electronically Signed     BA/MEDQ  D:  03/26/2009  T:  03/26/2009  Job:  [id number]   cc:   Gracelyn Nurse, M.D.
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
## 2011-02-13 NOTE — Discharge Summary (Signed)
NAME:  [name], [name]                ACCOUNT NO.:  [id number]   MEDICAL RECORD NO.:  [id number]          PATIENT TYPE:  INP   LOCATION:  6732                         FACILITY:  [facility]   PHYSICIAN:  Ruthy Dick, MD    DATE OF BIRTH:  Apr 20, 1961   DATE OF ADMISSION:  03/26/2009  DATE OF DISCHARGE:  03/31/2009                               DISCHARGE SUMMARY   REASON FOR ADMISSION:  Generalized weakness, decreased appetite, nausea  and some vomiting.  Also acute renal failure.   FINAL DISCHARGE DIAGNOSES:  1. Acute renal failure, resolved.  2. Nausea and vomiting of unknown etiology, but gastroparesis      suspected.  3. Depression.  The patient has no suicidal tendencies or homicidal      tendencies.  Attempt to have her psych consult was not successful,      but then again the patient since then did not warrant psychiatry      input necessarily.  She can certainly follow with her primary care      physician since there is no suicidal or major depression symptoms      at this time.  4. Diabetes mellitus type 2.  5. Constipation.  6. History of Crohn's proctocolitis, which was noted to be in active,      April 15, 2007.  7. Allergic rhinitis.  8. Peripheral neuropathy.  9. Dyslipidemia.  10.Hypertension.  11.Glaucoma.   BRIEF HISTORY OF PRESENT ILLNESS AND HOSPITAL COURSE:  This is a 50-year-  old African American lady with history of diabetes, depression,  hypertension, and Crohn's proctocolitis came to the emergency with a 2D  history of decreased p.o. intake, nausea, and has little vomiting.  She  was evaluated in the emergency room and a BUN and creatinine came out to  be 45 and 2.9, respectively.  Because of this, she was admitted and IV  dehydration was applied.  She was started on IV hydration.  She did well  on this regimen and her renal function returned back to normal.  As  noted, the patient said that she was depressed, but no suicidal or  homicidal tendencies  elicited.  Today, she talked well in a very good  mood, says that she is very happy that she is about to go home,  otherwise we would recommend a primary care physician to reevaluate her  as far as her depression is concerned during her visit to him.   PRIMARY CARE DOCTOR:  [name], MD, we recommend that the patient  call Dr. Henriette Combs office and make an appointment in 1 week or less.   REVIEW OF SYSTEMS:  VITAL SIGNS:  Today, her vitals are temperature  98.2, pulse 103, respirations 20, blood pressure 157/100, and saturating  100% on room air.  CHEST:  Clear to auscultation bilaterally.  ABDOMEN:  Soft and nontender.  EXTREMITIES:  No clubbing.  No cyanosis.  No edema.  CARDIOVASCULAR:  First and second heart sounds only.  CENTRAL NERVOUS  SYSTEM:  Nonfocal.   Time used for discharge planning is greater than 30 minutes.   DISCHARGE MEDICATIONS:  She is to go home on the following medications:  1. Neurontin 800 mg p.o. t.i.d.  2. Amaryl 2 mg p.o. daily.  3. Flexeril 10 mg t.i.d. p.r.n. for spasms.  4. Xanax 1 mg p.o. t.i.d.  5. Loratadine 10 mg p.o. daily.  6. Trazodone 50 mg p.o. daily.  7. Protonix 40 mg p.o. b.i.d.  8. Crestor 10 mg p.o. daily.  9. Lisinopril 5 mg p.o. daily.  10.Phenergan 25 mg b.i.d. p.r.n. for nausea.  11.Alphagan 0.1% one drop in each eyes t.i.d.   The patient is going to go home with her brother, who she is calling at  this point to come and get her.  The patient says she is going to spent  some time with him prior to locating another accommodation for herself.  This patient would be discharged after she has been given 0.1 mg of  clonidine and her blood pressure rechecked.  She will then continue  taking lisinopril at home as previously prescribed.      Ruthy Dick, MD  Electronically Signed     Ruthy Dick, MD  Electronically Signed    GU/MEDQ  D:  03/31/2009  T:  04/01/2009  Job:  [id number]   cc:   [name], M.D.

## 2011-02-13 NOTE — Discharge Summary (Signed)
NAME:  [name], [name]                ACCOUNT NO.:  [id number]   MEDICAL RECORD NO.:  [id number]          PATIENT TYPE:  INP   LOCATION:  5529                         FACILITY:  [facility]   PHYSICIAN:  Hillery Aldo, M.D.   DATE OF BIRTH:  August 09, 1961   DATE OF ADMISSION:  04/12/2007  DATE OF DISCHARGE:  04/16/2007                               DISCHARGE SUMMARY   PRIMARY CARE PHYSICIAN:  Dr. Emeline Darling with [facility].   GASTROENTEROLOGIST:  Dr. Laural Benes.   DISCHARGE DIAGNOSES:  1. Self-limited hematochezia.  2. History of Crohn disease/proctocolitis, inactive currently.  3. Chronic anemia.  4. Diabetes mellitus.  5. Pyrexia, resolved.  6. Tobacco abuse.  7. Neuropathy with chronic pain.  8. Allergic rhinitis.  9. Hypertension.  10.History of alcoholic gastritis/pancreatitis.  11.History of depression.  12.Gastroparesis.   DISCHARGE MEDICATIONS:  1. Protonix 40 mg b.i.d.  2. Amaryl 2 mg daily.  3. Metformin 1000 mg q.a.m., 500 mg q.p.m.  4. Trazodone 50 mg q.h.s.  5. Xanax 1 mg up to four times daily p.r.n.  6. Amitriptyline 50 mg q.h.s.  7. Phenergan 25 mg q.4 h p.r.n. nausea.  8. Sulfasalazine 1000 mg b.i.d.  9. Neurontin 800 mg q.a.m., 300 mg q. 2 p.m., 800 mg q.h.s.  10.MiraLax 17 grams daily.  11.Crestor or 10 mg daily.  12.Multivitamin, iron daily.  13.Zestril 5 mg daily.  14.Vicodin 5/325 one tablet up to three times daily p.r.n.   CONSULTATIONS:  Dr. Laural Benes of Gastroenterology.   BRIEF ADMISSION HISTORY OF PRESENT ILLNESS:  The patient is a 50-year-  old female with past medical history of proctocolitis, thought to be  Crohn's disease.  She developed some lower abdominal pain and  hematochezia and presented to the emergency department for evaluation.  For full details of the HPI, please see the dictated report done by [facility].   PROCEDURES AND DIAGNOSTIC STUDIES:  1. Chest x-ray on April 12, 2007 showed no active disease.  2. CT scan of the abdomen and pelvis on April 13, 2007, showed no      active or acute findings in the abdomen or pelvis.  3. Chest x-ray on April 13, 2007, status post placement of peripherally      inserted central catheter showed suboptimal positioning with the      PICC-line extending up into the right jugular vein.  There was also      mild left base atelectasis.  Repeat chest x-ray on April 13, 2007,      after repositioning of the PICC, showed the PICC-line tip at the      SVC/right atrial junction.  4. Colonoscopy on April 15, 2007, showed inactive Crohn's colitis with      no bleeding.   DISCHARGE LABORATORY VALUES:  White blood cell count 13.1, hemoglobin  10.3, hematocrit 30.9, platelets 219.  Sodium was 141, potassium 3.5,  chloride 112, bicarb 23, BUN 3, creatinine 0.66, glucose 87.   HOSPITAL COURSE:  #1 - ACUTE LOWER GI BLEED: The patient's  hemoglobin/hematocrit remained stable throughout the course of her  hospitalization.  She did not require  any transfusion of packed red  blood cells.  It was felt that this could represent a flare of her  Crohn's colitis and gastroenterologist was consulted and evaluated the  patient with colonoscopy with findings as noted above.  The patient's  proctocolitis was essentially inactive.  It is likely that she had an  episode of hematochezia, possibly due to hemorrhoids stemming from  constipation.  The patient has a history of underlying chronic anemia.   #2 - ACUTE ON CHRONIC ANEMIA: The patient likely has chronic blood loss  from her Crohn's disease.  She did not require any transfusion while in  the hospital.  She is instructed to take a multivitamin with iron on  discharge.   #3 - PYREXIA IN A PATIENT WITH A HISTORY OF CROHN'S PROCTOCOLITIS:  The  patient was put empirically on Cipro and Flagyl.  She defervesced.  Sulfasalazine was restarted under the direction of the  gastroenterologist.  Given the relative inactivity of proctocolitis on  colonoscopy, Cipro and Flagyl were  discontinued.  The patient remains  afebrile off antibiotics.   #4 - DIABETES: The patient was maintained on Lantus sliding scale  insulin in house with sugars that were well controlled.  She will be  discharged on her usual outpatient regimen.   #5 - TOBACCO ABUSE: The patient was counseled on the importance of  cessation.  A nicotine patch was provided for her while in the hospital.   #6 - CHRONIC PAIN/NEUROPATHY: The patient's pain was managed with a  combination of p.r.n. narcotics, Toradol/Motrin and her usual dose of  Neurontin.   DISPOSITION:  The patient is medically stable for discharge home and  should follow up with her primary care physician and with Dr. Laural Benes in  1-2 weeks.      Hillery Aldo, M.D.  Electronically Signed     CR/MEDQ  D:  04/16/2007  T:  04/17/2007  Job:  [id number]   cc:   [facility] Dr. Caesar Bookman, M.D.

## 2011-02-13 NOTE — H&P (Signed)
NAME:  [name], [name]                ACCOUNT NO.:  [id number]   MEDICAL RECORD NO.:  [id number]          PATIENT TYPE:  INP   LOCATION:                               FACILITY:  [facility]   PHYSICIAN:  [name], M.D.   DATE OF BIRTH:  May 12, 1961   DATE OF ADMISSION:  12/17/2008  DATE OF DISCHARGE:                              HISTORY & PHYSICAL   ADMISSION DIAGNOSES:  1. Persistent nausea and vomiting for 5 days.  2. Volume contraction with hypotension.  3. 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   HISTORY:  Ms. [name] is a 50 year old female born on November 03, 1960.  Ms. [name] presents to my office with a 5-day history of nausea  and vomiting with any solid food or liquid intake which has been going  on for the past 5 days.  She is experiencing some mild epigastric  discomfort.  She reports no gastrointestinal bleeding or diarrhea.   The patient was diagnosed with universal Crohn proctocolitis by  colonoscopy on November 12, 2002.  On April 15, 2007, colonoscopy  revealed inactive Crohn colitis.   The patient has chronic type 2 diabetes mellitus and takes metformin.  She has mild gastroparesis by nuclear medicine gastric emptying study  (59% gastric retention at 2 hours).   CHRONIC MEDICATIONS:  1. Neurontin 800 mg 3 times a day.  2. Vicodin 5/500 mg 1 tablet every 6 hours as needed.  3. Metformin XR 500 mg twice a day with meals.  4. Silvadene 500 mg 4 times daily.  5. Crestor 10 mg daily.  6. Lisinopril 2.5 mg daily.  7. Claritin 10 mg daily.  8. Protonix 40 mg daily.  9. Trazodone 25 mg at bedtime.  10.Xanax 1 mg as directed.  11.Multivitamins as directed.   PAST MEDICAL HISTORY:  1. 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  2. Inactive Crohn colitis by colonoscopy on April 15, 2007.  3. Type 2 diabetes mellitus.  4. Mild gastroparesis.  5. Depression.  6. History of alcoholic pancreatitis with hepatitis.  7. Hypertension.  8. Allergic rhinitis.  9. Normal esophagogastroduodenoscopy on November 12, 2002.  10.Peripheral neuropathy.  11.Normal MRCP in 2005.  12.Cesarean section.  13.Uterine fibroid removal.   FAMILY HISTORY:  Positive for diabetes mellitus.   SOCIAL HISTORY:  The patient continues to smoke cigarettes, but quit  consuming alcohol in 2005.   MEDICATION ALLERGIES:  PENICILLIN and INTRAVENOUS CONTRAST.   Weight 110 pounds, temperature 97.2, blood pressure 80/60 with rapid  regular pulse.  Nonicteric sclera.  Oropharynx normal.  Lungs clear to  auscultation.  Cardiac exam reveals a regular tachycardia at  approximately 110 beats per minute, no audible murmurs.  Abdomen is soft  and flat.  Bowel sounds are active.   ASSESSMENT:  1. Nausea, vomiting, and volume contraction.  2. Chronic Crohn proctocolitis.  3. Type 2 diabetes mellitus.  4. Mild gastroparesis.           ______________________________  [name], M.D.     MJ/MEDQ  D:  12/17/2008  T:  12/18/2008  Job:  [id number]   cc:   Georgann Housekeeper, MD

## 2011-02-13 NOTE — Consult Note (Signed)
NAME:  [name], [name]                ACCOUNT NO.:  [id number]   MEDICAL RECORD NO.:  [id number]          PATIENT TYPE:  INP   LOCATION:  5529                         FACILITY:  [facility]   PHYSICIAN:  [name], M.D.    DATE OF BIRTH:  1961-07-11   DATE OF CONSULTATION:  04/12/2007  DATE OF DISCHARGE:                                 CONSULTATION   HISTORY:  The patient is familiar to my partner, [name], who he  has followed for non-specific versus Crohn's colitis, who had been doing  fairly well.  Supposedly had blood work in May or June at [facility].  No mention of anemia, but had significant GI bleeding yesterday.  Called  me earlier today and was instructed to go to the emergency room.  She  has had some loose bowels today, but no signs of bleeding, but just felt  weak and dizzy.  Here I the emergency room she was found to have a  hemoglobin of 5, and was admitted based on her multiple other medical  problems by the [facility].  We are following in consultation.  She has had multiple colonoscopies and flex sigs by Dr. Laural [name] in the  past which showed multiple colonic ulcers including in the rectum.  She  has had prior rectal abscess drained by Dr. Carolynne [name], as well as a negative  endoscopy a few years ago.  She said she was fine until yesterday when  the bleeding started, but has had some increased constipation, been  using heavier laxatives, and does take an aspirin a day, but does not  use any extra nonsteroidals.  She has taken some Tylenol for her pain.   PAST MEDICAL HISTORY:  Pertinent for the colitis as mentioned above, as  well as type 2 diabetes with neuropathy and gastroparesis.  As well as  chronic back pain, history of pancreatitis, probably from alcohol which  she quit 3 years ago.  She continues to smoke.  She also has  hypertension, increased cholesterol, hiatal hernia, and GERD.   MEDICINES AT HOME:  Include Vicodin, Protonix, Amaryl, metformin,  Transderm, Xanax, amitriptyline, Phenergan, Zestril, multivitamins,  aspirin, Crestor, MiraLax, Neurontin, and sulfasalazine which she has  been on roughly 1000 four times a day.   ALLERGIES:  PENICILLIN AND IV DYE.   FAMILY HISTORY:  Negative for any obvious colitis or GI problems.   REVIEW OF SYSTEMS:  Pertinent for feeling better with the blood she has  gotten.  She denies any other complaints.   PHYSICAL EXAMINATION:  GENERAL:  No acute distress.  Laying comfortable  in the bed.  ABDOMEN:  Soft, nontender.  VITAL SIGNS:  She did have a temperature of 101.  Blood pressure with  hydration has come up nicely.   LABORATORY DATA:  Pertinent for a white count of 15.3, with a hemoglobin  of 5.7, normal MCV and platelet count.  BUN of 20, creatinine of 0.9.  Liver test normal.   ASSESSMENT:  1. Multiple medical problems.  2. History of Crohn's colitis versus non-specific colitis.  3. Yesterday a lower gastrointestinal bleed,  with downward hemoglobin      of 5.  Currently stable.   PLAN:  Agree with a CT secondary to the fever and the increased white  count, as well as Cipro and Flagyl, clear liquids only, and Protonix.  Holding aspirin.  Dr. Laural [name] knows her well and hopefully will see her  on Monday, but I will check on her tomorrow.  Would consider endoscopy  or colonoscopy if signs of further bleeding.  Otherwise, will leave  further workup to Dr. Laural [name].  She has some records on E-chart, but  will find out from him whether he has scoped her in our endoscopy center  across the street, and could obtain those records if need be.  She is  not sure if she has ever been over there.  Will follow with you.           ______________________________  [name], M.D.     MEM/MEDQ  D:  04/12/2007  T:  04/13/2007  Job:  [id number]   cc:   [facility]  [name], M.D.  [name]

## 2011-02-13 NOTE — Discharge Summary (Signed)
NAME:  [name], [name]                ACCOUNT NO.:  [id number]   MEDICAL RECORD NO.:  [id number]          PATIENT TYPE:  INP   LOCATION:  5020                         FACILITY:  [facility]   PHYSICIAN:  [name], M.D.   DATE OF BIRTH:  [birth year]   DATE OF ADMISSION:  12/17/2008  DATE OF DISCHARGE:  12/22/2008                               DISCHARGE SUMMARY   DISCHARGE DIAGNOSES:  1. Resolved nausea and vomiting of undetermined etiology.  2. Universal Crohn's proctocolitis diagnosed by colonoscopy on      November 12, 2002.  3. Inactive Crohn's colitis by colonoscopy on April 15, 2007.  4. Type 2 diabetes mellitus.  5. Mild gastroparesis.  6. Depression.  7. History of alcohol-induced pancreatitis and hepatitis.  8. Hypertension.  9. Allergic rhinitis.  10.Normal esophagogastroduodenoscopy on November 12, 2002.  11.Diabetic peripheral neuropathy.  12.Normal magnetic resonance cholangiopancreatography in 2005.  13.Cesarean section.  14.Uterine fibroid removed.  15.Allergies to PENICILLIN and INTRAVENOUS CONTRAST.   DISCHARGE MEDICATIONS:  1. Neurontin 800 mg 3 times daily.  2. Crestor 10 mg daily.  3. Protonix 40 mg daily.  4. Trazodone 25-50 mg at bedtime.  5. Xanax 1 mg q.8 h. p.r.n.  6. Multivitamin daily.  7. Vicodin 10/660 mg 1 tablet every 6 hours p.r.n.  8. Flexeril 10 mg 3 times daily.  9. Loratadine 10 mg daily p.r.n.  10.Oxycodone with Tylenol 10/650 mg 1 tablet q.6 h. p.r.n.   DISCONTINUED MEDICATIONS:  Azulfidine, metformin, lisinopril.   OFFICE FOLLOWUP:  I have instructed [name] to check her fasting blood  sugar each morning at home over the next 2 weeks.  I will see her in the  office in approximately 2 weeks to go over her fasting blood sugar  values and also to recheck her blood pressure off antihypertensive  medication.   HOSPITAL COURSE:  [name] is a 50 year old female born on  07-May-1961.  The patient was admitted to [facility] on  December 17, 2008, after a 5-day history of persistent nausea and vomiting,  unassociated with gastrointestinal bleeding or diarrhea.  Her admitting  blood sugar was 57, and her admitting blood pressure was 80/60.   The patient's admitting acute abdominal x-ray series, CBC, sedimentation  rate, complete metabolic profile, and lipase were normal.  On admission,  her antihypertensive therapy, metformin, Amaryl, and Azulfidine were  discontinued.  With intravenous saline volume expansion, her nausea and  vomiting resolved to the point she was consuming a ADA diet.  Prior to  discharge, her upper GI x-ray series was unremarkable.   The patient is discharged in stable medical condition.  She will follow  up with me in the office in approximately 2 weeks.           ______________________________  [name], M.D.     MJ/MEDQ  D:  12/21/2008  T:  12/22/2008  Job:  [id number]

## 2011-02-16 NOTE — Discharge Summary (Signed)
[place]  Patient:    [name], [name]                       MRN: [id number] Adm. Date:  [id number] Disc. Date: [id number] Attending:  Myles Rosenthal Dictator:   Janyce Llanos, M.D.                           Discharge Summary  DISCHARGE DIAGNOSES: 1. Acute hepatitis likely alcoholic in nature. 2. Nausea and vomiting. 3. Hypertension. 4. Tobacco abuse. 5. Normocytic anemia.  DISCHARGE MEDICATIONS: 1. Phenergan 25 mg p.o. q.6h. p.r.n. nausea. 2. Elavil 50 mg p.o. q.h.s. p.r.n. insomnia. 3. Lotensin as previously prescribed. 4. Zyrtec 10 mg p.o. q.d.  PROCEDURES:  None.  CONSULTATIONS:  None.  HISTORY OF PRESENT ILLNESS:  Patient is a 50 year old African-American female with no significant past medical history who presented to [facility] on the day of admission with 10 days of nausea and vomiting.  She has no history of abdominal pain or diarrhea, although she did have one loose bowel movement on the day prior to admission which she attributes to her potassium replacement by [facility].  She is currently unable to keep food or liquids down and very graphically describes how solids and liquids feel like they get stuck in her epigastrium.  Patient thinks that the vomiting started secondary to her sinus congestion which has since resolved and now she just is continuing with her vomiting.  PAST MEDICAL HISTORY:  Hypertension and allergic rhinitis and also status post C-section in 1996.  MEDICATIONS:  Lotensin and Zyrtec, and she has a history of Depo-Provera which she stopped in September 2001.  ALLERGIES:  PENICILLIN and IV CONTRAST which gives her a reaction of burning inside.  FAMILY HISTORY:  Diabetes in her mother, father, brothers, and sisters. Mother passed away in her 70s.  SOCIAL HISTORY:  She lives with her ailing father and 58-year-old son.  She works as a Research officer, trade union.  She admits to one-half pack per day  of tobacco x 15 years and a six pack of alcohol per weekend.  No illicit drug use.  REVIEW OF SYSTEMS:  Noncontributory.  PHYSICAL EXAMINATION:  VITAL SIGNS:  Her temperature is 98.6.  Her heart rate while supine is 64 with a BP of 108/78 and upon standing heart rate is 96 with a BP of 108/84. Respirations are 20.  She is 100% saturated on room air.  GENERAL:  She is a well-developed, well-nourished, African-American female in no acute distress.  HEENT:  Mucous membranes are slightly dry.  Oropharynx is clear without exudate.  Sclerae are anicteric bilaterally.  CARDIOVASCULAR:  Regular rate and rhythm without murmurs, rubs, or gallops.  NECK:  There is no JVD.  LUNGS:  Respirations are clear to auscultation bilaterally.  GASTROINTESTINAL:  Positive bowel sounds, soft, nontender, nondistended, no hepatosplenomegaly, no mass.  RECTAL:  Brown heme-negative stool.  EXTREMITIES:  Without edema; however, there is slight bilateral finger clubbing.  NEUROLOGIC:  She is awake and oriented x 3, nonfocal.  ADMISSION LABORATORIES:  Her sodium was 141, potassium is 2.7, chloride is 107, bicarb 23, BUN 3, creatinine 0.6, glucose 89, calcium 7.7.  White count was 10.7 with an ANC of 5.8, hemoglobin was 11.1, and platelets were 297, MCV was 95.2.  She had abdominal ultrasound on admission revealed moderate increase in liver size with sludge in the gallbladder and an 8.2  mm common bile duct which is slightly enlarged.  Chest x-ray is negative for any active disease.  HOSPITAL COURSE: #1 - NAUSEA AND VOMITING WITH HEPATITIS:  Patient was admitted for IV hydration and potassium replacement.  She did well in the hospital with quick resolution of her nausea and vomiting.  She was quickly able to be advanced to clear liquids which then was advanced to a regular diet.  On admission, her liver function tests were significant for an AST of 149 and an ALT of 96 with a Total bilirubin of  1.4, alkaline phosphatase of 101, Total protein of 5.6. We obtained her outpatient records from [facility] and she had a negative viral hepatitis panel on October 07, 2000 which included negative hepatitis A, B, C.  Since her symptoms resolved so quickly in-house, we have a tentative diagnosis of possibly acute alcoholic hepatitis since her symptoms started on New Years Day versus passed biliary stone with a residual slightly increased common bile duct diameter.  We offered her counseling on her alcohol use and she continues to claim that she only drinks about one six pack on the weekends and does not feel like this is a significant problem for her.  Regardless, upon discharge, we instructed her to avoid alcohol for several weeks as this could exacerbate her current condition.  #2 - HISTORY OF DYSPHAGIA:  We did check a barium swallow prior to discharge and this was completely negative for any kind of mass or obstruction.  We will just have to follow these symptoms as an outpatient.  DISPOSITION:  To home with follow up at [facility].DD:  10/17/00 TD:  10/18/00 Job: [id number]

## 2011-02-16 NOTE — Op Note (Signed)
   NAME:  [name], DUCLOS                          ACCOUNT NO.:  [id number]   MEDICAL RECORD NO.:  [id number]                   PATIENT TYPE:  INP   LOCATION:  5014                                 FACILITY:  [facility]   PHYSICIAN:  [name], M.D.                DATE OF BIRTH:  [birth year]   DATE OF PROCEDURE:  11/12/2002  DATE OF DISCHARGE:                                 OPERATIVE REPORT   PROCEDURE PERFORMED:  Colonoscopy.   ENDOSCOPIST:  Charolett Bumpers, M.D.   INDICATIONS FOR PROCEDURE:  The patient is a 50 year old female admitted to  the hospital to evaluate generalized weakness, macrocytic anemia and guaiac  positive stool.  Her esophagogastroduodenoscopy performed November 10, 2002  was normal.   PREMEDICATION:  Demerol 50 mg, Versed 7.5 mg.   DESCRIPTION OF PROCEDURE:  After obtaining informed consent, the patient was  placed in the left lateral decubitus position.  I administered intravenous  Demerol and intravenous Versed to achieve conscious sedation for the  procedure.  The patient's blood pressure, oxygen saturations and cardiac  rhythm were monitored throughout the procedure and documented in the medical  record.   The pediatric Olympus video colonoscope was introduced into the rectum and  advanced to the cecum.  Colonic preparation for the exam today was  excellent.  Ms. Gharibian has universal proctocolitis consistent with Crohn's  colitis.  She has friable red colonic mucosa associated with deep ulcers  adjacent to normal-appearing mucosa.  There are no neoplastic appearing  polyps.  I was unable to examine the distal ileum with the colonoscope.  Multiple biopsies were taken along the length of her colon.   ASSESSMENT:  Universal proctocolitis consistent with Crohn's proctocolitis.  Colonic biopsies pending.   RECOMMENDATIONS:  Start oral Cipro and Flagyl.  Schedule a small bowel  follow-through x-ray series to rule out ileitis.  Began  multi-vitamin.                                                  [name], M.D.    MJ/MEDQ  D:  11/12/2002  T:  11/12/2002  Job:  [id number]   cc:   Ermalene Searing. Leander Rams, M.D.  [address] [place] [address]  Fax: [phone]

## 2011-02-16 NOTE — Discharge Summary (Signed)
NAME:  [name], [name]                          ACCOUNT NO.:  [id number]   MEDICAL RECORD NO.:  [id number]                   PATIENT TYPE:  INP   LOCATION:  5511                                 FACILITY:  [facility]   PHYSICIAN:  Isidor Holts, M.D.               DATE OF BIRTH:  Jul 21, 1961   DATE OF ADMISSION:  05/23/2004  DATE OF DISCHARGE:  06/07/2004                                 DISCHARGE SUMMARY   GASTROENTEROLOGIST:  Dr. [name].   PRIMARY CARE PHYSICIAN:  Dr. Duke Salvia at [facility].   PRIMARY DIAGNOSES:  1.  Acute pancreatitis.  2.  Exacerbation of Crohn's colitis.   SECONDARY DIAGNOSES:  1.  Type 2 diabetes mellitus.  2.  Diabetic gastroparesis.  3.  Diabetic neuropathy.  4.  Electrolyte disorder, i.e., hypokalemia, hypomagnesemia and      hypocalcemia.  5.  Anemia.  6.  Malnutrition.  7.  Deconditioning.   DISCHARGE MEDICATIONS:  1.  Neurontin 800 mg p.o. t.i.d.  2.  Protonix 40 mg p.o. b.i.d.  3.  Mesalamine (Asacol) 750 mg p.o. t.i.d.  4.  Magnesium oxide 400 mg p.o. t.i.d.; recommend that reduced to b.i.d.      should diarrhea supervene.  5.  Lantus insulin 10 units subcutaneously nightly.  6.  Flagyl 250 mg p.o. b.i.d. for 5 weeks, then 250 mg p.o. daily for 6      weeks.   PROCEDURES:  1.  Chest x-ray, May 23, 2004:  No acute disease.  2.  Abdominal ultrasound scan, May 23, 2004:  This showed intra-      /extrahepatic bile duct dilatation, query granulomatous changes in the      spleen.  3.  Gastric emptying study, May 24, 2004, demonstrated severe gastric      emptying consistent with gastroparesis, i.e., original activity 65% at 1      hour and 69% at 2 hours.  4.  Abdominal CT scan, May 25, 2004:  This showed diffuse colonic edema      consistent with colitis.  5.  Flexible sigmoidoscopy, May 29, 2004:  This showed internal and      external hemorrhoids, anal inflammation, fistula and perirectal disease;      also,  pseudopolyps, shallow ulcers in proximal colon and descending      colon.   CONSULTANTS:  1.  James L. Randa Evens, M.D., GI.  2.  [name], M.D., GI.   ADMITTING HISTORY:  Admitting history as per H&P, however, briefly, this is  a 50 year old African American female who was seen in the emergency  department on May 23, 2004 with __________  nausea, abdominal pain,  vomiting, poor appetite and weight loss of at least 16 pounds.  She has a  known history of diabetes mellitus with peripheral neuropathy and  gastroparesis, and a prior history of pancreatitis.  Initial laboratory  investigations showed negative urinalysis; sodium was 131, potassium  3.5,  chloride 103, CO2 14, BUN 36, creatinine 1.4, glucose 176, AST 11, ALT less  than 8, albumin 2.8, alkaline phosphatase 159, calcium 9.9, lipase 117; WBC  25, hemoglobin 13, platelets 665,000, MCV 83.  She was admitted for further  evaluation, workup and treatment.   CLINICAL COURSE:  PROBLEM #1 - ACUTE PANCREATITIS AS EVIDENCED BY ABDOMINAL  PAIN, __________ NAUSEA AGAINST A BACKGROUND OF DIABETES MELLITUS AND  POSSIBLE GASTROPARESIS:  Amylase on admission was 117.  She was kept n.p.o.,  treated with IV fluids, IV Reglan and proton pump inhibitors.  Symptoms  subsided.  On May 25, 2004, lipase was 51.  The patient was again able to  tolerate oral intake and that was gradually broadened.   PROBLEM #2 - GASTROPARESIS:  See #1 above, confirmed by gastric transit  study, responded to IV Reglan, which was subsequently discontinued on  June 02, 2004 without relapse of symptoms.   PROBLEM #3 - INFLAMMATORY BOWEL DISEASE:  Flare-up was clinically evidenced  by elevated WBC, diffuse abdominal pain, blood in the stools, intestinal  hurry.  This responded to IV fluids, intravenous ciprofloxacin, Flagyl and  also Asacol.  The patient was seen by gastroenterology; management was  continued per gastroenterology's recommendations.   Ciprofloxacin was  subsequently discontinued by GI on June 01, 2004; Flagyl, however, was  continued and per GI recommendations, at least a 12-week course of treatment  is recommended.   PROBLEM #4 - ANEMIA LIKELY SECONDARY TO INFLAMMATORY BOWEL DISEASE:  Hemoglobin was 7.6 on May 25, 2004.  Patient was managed with transfusion  of a total of 3 units PRBCs and as of June 05, 2004, hemoglobin was  stable at 11.3.   PROBLEM #5 - ELECTROLYTE DISORDER:  Electrolyte disorder, i.e., hypokalemia  which responded gradually to IV supplementation; also, hypomagnesemia  thought secondary to diarrhea, vomiting, malnutrition.  We repleted  initially with intravenous magnesium and then subsequently, the patient was  commenced on magnesium oxide p.o.  She was discharged on magnesium  supplementation.  Serum calcium hovered around 7 mmol/L, however, albumin  was found to be low at 17, indicating that corrected calcium was within  normal limits.   PROBLEM #6 - DIABETES MELLITUS:  Controlled with scheduled Lantus injections  and also sliding-scale insulin during hospital stay.   PROBLEM #7 - MALNUTRITION:  Patient had nutritional consultation while on  admission and appropriate recommendations made, which she will continue to  follow on an outpatient basis.   PROBLEM #8 - DECONDITIONING:  Evaluated by PT/OT as inpatient, physical  therapy initiated, however, patient's is to continue.   DISPOSITION:  The patient was discharged home on June 07, 2004.  She is  to continue with outpatient physical therapy for occupational therapy.  She  will also require a home health aide.  She is to follow up with her PCP, Dr.  Duke Salvia, at [facility] in 1 week and with Dr. [name] in  gastroenterology in 2 weeks.                                                Isidor Holts, M.D.   CO/MEDQ  D:  06/11/2004  T:  06/12/2004  Job:  [id number]   cc:   [name], M.D.  [address]  [place]  [place] [address]  Fax: [phone]  [facility] Duke Salvia M.D.

## 2011-02-16 NOTE — Consult Note (Signed)
NAME:  [name], [name]                          ACCOUNT NO.:  [id number]   MEDICAL RECORD NO.:  [id number]                   PATIENT TYPE:  INP   LOCATION:  0465                                 FACILITY:  [facility]   PHYSICIAN:  James L. Malon Kindle., M.D.          DATE OF BIRTH:  1961/06/07   DATE OF CONSULTATION:  12/24/2002  DATE OF DISCHARGE:                                   CONSULTATION   REPORT TITLE:  GASTROENTEROLOGY CONSULTATION   REASON FOR CONSULTATION:  Failure to thrive.   HISTORY OF PRESENT ILLNESS:  This is a 50 year old black female who was  admitted to [facility] for generalized weakness with macrocytic  anemia, guaiac-positive stools, no nausea, vomiting, or abdominal pain.  She  was evaluated by Dr. [name].  This evaluation included a colonoscopy  done due to her diarrhea and rectal bleeding.  The colonoscopy showed  pancolitis from the rectum to the cecum.  The terminal ileum was not  entered.  Biopsy showed colitis with chronic feature plasma cells, and  lymphocytes could be either Crohn's versus self-limited infectious colitis.  The patient was treated with Cipro and Flagyl and discharged.   Other procedures done while she was here include an  esophagogastroduodenoscopy which was normal, and a small bowel series that  was normal, no evidence of Crohn's, lymphoma, etc.  The patient was  discharged home on Cipro and Flagyl with the feeling this might have been  either Crohn's or infectious colitis with plans to follow up with Dr.  Laural Benes as an outpatient.  She apparently did not do that.  She took the  Cipro and Flagyl for a while and she is not clear if she finished it.  She  did not see Dr. Laural Benes, but subsequently came back to [facility] and was  seen there, and she was readmitted with failure to thrive.   The patient notes that she is unable to eat, she is anorexic, the smell of  food makes her sick.  She has about three bowel movements a  day and does not  see any gross blood.  She was examined in the emergency room by the ER  doctor on admission and this revealed heme-negative stools.  In addition to  the above studies of the endoscopy, colonoscopy, and small bowel series, the  patient also had a CT scan of the abdomen and pelvis showing pancolitis.  She was admitted here with the diagnosis of failure to thrive, and currently  denies any abdominal pain or nausea.  She simply does not feel like eating.   LABORATORY DATA:  Her labs here have been remarkable for hemoglobin 7.5 on  admission despite her heme-negative stool, MCV 103.8, white count slightly  elevated at 11, with 87% neutrophils.  There were Pappenheimer bodies,  polychromatic cells, Howell-Jolly bodies, target cells on the smear.  Other  pertinent labs revealed an AST slightly elevated as  well as a slightly  elevated alkaline phosphatase but, otherwise, normal liver function tests.  Albumin was low at 2.1.  HIV was obtained and was nonreactive.  Urinalysis  was negative.   CURRENT MEDICATIONS:  1. Phenergan.  2. Lisinopril.  3. Vioxx.  4. Amitriptyline.  5. Excedrin.   ALLERGIES:  IVP DYE.    PAST MEDICAL HISTORY:  1. 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  2. History of acute hepatitis, felt to be alcoholic.  Workup, otherwise,     negative in 2002.  This included a negative serology for hepatitis A, B,     and C.  At that time, she was noted to be a very heavy drinker.  3. History of tobacco abuse.  4. Hypertension.  5. History of cesarean section and removal of uterine fibroid.   FAMILY HISTORY:  Negative for cancer.   SOCIAL HISTORY:  The patient has a history of alcohol use, but denies  drinking at the current time.  She lives with her father and son.   PHYSICAL EXAMINATION:  GENERAL:  A thin cachetic appearing black female.  HEENT:  Eyes are nonicteric.  HEART:   Regular rate and rhythm without murmurs or gallops.  LUNGS:  Clear.  ABDOMEN:  Soft and totally nontender with normal bowel sounds.  The liver is  palpable at the costal margin.  RECTAL:  Not repeated.  Stool was heme negative.   ASSESSMENT:  1. Failure to thrive, questionable cause.  It appears this is more due to     anorexia then primary gastrointestinal disease.  She simply does not want     to eat.  At this point, she has had a very extensive workup with an     endoscopy, colonoscopy, small bowel series, as well as a CT scan, all of     which failed to show anything other then colitis which probably was     infectious.  In addition, the human immunodeficiency virus is negative.     Maybe this is all related to alcohol use.  2. History of pancolitis.  It appears to have resolved at the current time,     although the patient does have 2-3 loose stools a day.  3. History of alcoholic hepatitis.   PLAN:  Would do a calorie count, try the patient on a regular diet.  We will  go ahead and obtain a stool sample to  look for white cells and enteric  pathogens.                                               James L. Malon Kindle., M.D.    Waldron Session  D:  12/24/2002  T:  12/24/2002  Job:  [id number]   cc:   Raeford Razor, M.D.  [address]  [place]  [place] [address]  Fax: [phone]

## 2011-02-16 NOTE — Consult Note (Signed)
NAME:  [name], [name]                          ACCOUNT NO.:  [id number]   MEDICAL RECORD NO.:  [id number]                   PATIENT TYPE:  INP   LOCATION:  3741                                 FACILITY:  [facility]   PHYSICIAN:  Ollen Gross. Vernell Morgans, M.D.              DATE OF BIRTH:  [birth year]   DATE OF CONSULTATION:  10/27/2003  DATE OF DISCHARGE:                                   CONSULTATION   REFERRING PHYSICIAN:  Dr. Hettie Holstein.   REASON FOR CONSULTATION:  [name] is a 50 year old black female who was  admitted yesterday to the medical service with apparent episode of diabetic  ketoacidosis.  She was also complaining of some diffuse abdominal pain/leg  pain.  She noted a fever of 102 and elevated white count.  As part of her  workup, a CT scan was done that showed a small cystic fluid collection near  her rectum.  The patient denies any discharge from her rectum at all.  She  has not had any rectal pain and she is starting to feel better as her sugar  is becoming more under control.  She otherwise denies chest pain, shortness  of breath, dysuria; she has had some diarrhea the past couple of days.  The  rest of her review of systems is unremarkable.  She is really not  forthcoming with any information and does not stay on the subject of her  interview.   PAST MEDICAL HISTORY:  Her past medical history is significant for:  1. Diabetes.  2. Hypertension.  3. Peripheral neuropathy.  4. Alcohol abuse.  5. Crohn's disease.  6. Hypertension.   PAST SURGICAL HISTORY:  Past surgical history is unknown.   MEDICATIONS:  Medications include trazodone, iron, amitriptyline, Neurontin,  lisinopril, Lasix, Prevacid, Ultram, Zyrtec, prednisone, Imodium and Asacol.   ALLERGIES:  Allergies are to IV DYE and PENICILLIN.   SOCIAL HISTORY:  She has a history of alcohol abuse, denies any tobacco use.   FAMILY HISTORY:  Family history is significant for diabetes in her parents.   PHYSICAL  EXAMINATION:  VITAL SIGNS:  On physical exam, her temperature is  99.6, blood pressure is 120/90, pulse of 85, O2 saturation 99%.  GENERAL:  In general, she is a well-developed, well-nourished black female  in no acute distress.  SKIN:  Her skin is warm and dry with no jaundice.  EYES:  Her extraocular muscles are intact.  Pupils equal, round and reactive  to light.  Sclerae nonicteric.  LUNGS:  Lungs are clear bilaterally with no use of accessory respiratory  muscles.  HEART:  Heart has a regular rate and rhythm with an impulse in the left  chest.  ABDOMEN:  Abdomen is soft with only minimal tenderness, no guarding or  peritoneal signs, no palpable mass or hepatosplenomegaly.  EXTREMITIES:  No cyanosis, clubbing or edema.  PSYCHOLOGICAL:  She is alert and  oriented x3 with no state of anxiety or  depression, although she is not forthcoming with any information.  RECTAL:  She deferred any rectal exam except for just visual inspection and  on visual inspection, she has an external hemorrhoid.   LABORATORY DATA:  On review of her white count, her white count today is 16,  down from 27 yesterday.  Her blood sugar is now down to 200, down from 989  yesterday.   Her CT scan was reviewed with the radiologist and showed a very small fluid  pocket next to her rectum.   ASSESSMENT AND PLAN:  This is a 50 year old black female with diabetic  ketoacidosis, an external hemorrhoid and possibly a rectal abscess.  Since  she completely refused rectal exam, there is really not much we have to  offer her.  She understands the consequences of her actions.  We will  continue to follow her along with you and would recommend continued  treatment of her diabetes and coverage with broad-spectrum antibiotics.                                               Ollen Gross. Vernell Morgans, M.D.    PST/MEDQ  D:  10/27/2003  T:  10/28/2003  Job:  [id number]

## 2011-02-16 NOTE — Op Note (Signed)
NAME:  [name], [name]                          ACCOUNT NO.:  [id number]   MEDICAL RECORD NO.:  [id number]                   PATIENT TYPE:  INP   LOCATION:  5735                                 FACILITY:  [facility]   PHYSICIAN:  Ollen Gross. Vernell Morgans, M.D.              DATE OF BIRTH:  1961-07-25   DATE OF PROCEDURE:  10/29/2003  DATE OF DISCHARGE:  11/02/2003                                 OPERATIVE REPORT   PREOPERATIVE DIAGNOSIS:  Rectal abscess.   POSTOPERATIVE DIAGNOSIS:  Rectal abscess.   PROCEDURE:  Incision and drainage of rectal abscess.   SURGEON:  Ollen Gross. Carolynne Edouard, M.D.   ANESTHESIA:  General endotracheal anesthesia.   PROCEDURE:  After informed consent was obtained, the patient was brought to  the operating room and placed in the supine position on the operating table.  After induction of general anesthesia, the patient was placed in the  lithotomy position.  On digital exam, a palpable, fluctuant area posteriorly  could be felt.  There was no external signs of infection or palpable  fluctuance of induration.  An anoscope was placed within the rectum.  A 19  gauge needle and syringe were used to aspirate the area that could be  palpated and pus was able to be identified from this area.  The fluid was  sent for culture.  A hemostat was placed through the opening left by the 18  gauge needle and the cavity was opened bluntly with the hemostat.  The  cavity was small and contained and did not seem to track anywhere.  The  cavity was completely opened and drained bluntly with the hemostat.  Some  1/4 inch Nugauze was then placed in the cavity and a long string left coming  out of the rectum.  Sterile dressings were applied with plans to remove the  Nugauze the following day.  The patient tolerated the procedure well.  At  the end of the case, all needle, sponge, and instrument counts were correct.  The patient was awakened and taken to the recovery room in stable condition.                                          Ollen Gross. Vernell Morgans, M.D.    PST/MEDQ  D:  11/12/2003  T:  11/12/2003  Job:  [id number]

## 2011-02-16 NOTE — H&P (Signed)
NAME:  [name], [name]                          ACCOUNT NO.:  [id number]   MEDICAL RECORD NO.:  [id number]                   PATIENT TYPE:  INP   LOCATION:  1823                                 FACILITY:  [facility]   PHYSICIAN:  Vania Rea, M.D.              DATE OF BIRTH:  08-03-1961   DATE OF ADMISSION:  09/29/2003  DATE OF DISCHARGE:                                HISTORY & PHYSICAL   CHIEF COMPLAINT:  Diarrhea for six weeks, getting worse.   HISTORY OF PRESENT ILLNESS:  This is a 50 year old African-American lady  with a history of pancolitis, discharged from [facility] in April of this  year after evaluation and treatment.  Has been in good health since then.  The patient has a baseline stool frequency of three times per day, but has  been having increased episodes of watery loose stools since mid-November.  Stools are darkened, she thinks, because of supplemental iron, but there is  no blood.  Stool frequency is too numerous to count, but the patient has  been getting progressively weaker and dizzy, and has lost 8 pounds over the  past month.  She denies fever, nausea, or vomiting.  She denies cough,  shortness of breath, or chest pain.  The patient does suffer with painful  peripheral neuropathy, for which she takes Neurontin and Ultracet, but  denies the use of nonsteroidal anti-inflammatory drugs.   On previous admissions, the patient has benign repeatedly described as  having a history of alcohol abuse, and also a history of alcoholic  hepatitis, but the patient vigorously denies alcohol abuse.  It is unclear  exactly how much alcohol she uses.  She says she last took two drinks about  three months ago, and before that maybe six months ago at some unspecified  time.  Her heaviest use, she says, was four beers per weekend.  No recent  history of ETOH use.  She smokes half a pack of cigarettes per day.  Although her records indicate a history of Crohn's colitis, the patient  says  Crohn's colitis has been ruled out.   PAST MEDICAL HISTORY:  1. History of pancolitis, Crohn's colitis versus resolving Crohn's colitis     versus nonspecific pancolitis.  2. History of alcohol abuse.  No signs of withdrawal on admission.  3. Recurrent episodes of anemia secondary to GI loss for history of     alcoholic hepatitis.  4. Depression.  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  6. Tobacco abuse.  7. History of medical noncompliance.  8. History of hypertension.  9. History of neuropathy of undiagnosed etiology, presumably alcohol.   MEDICATIONS:  1. Elavil 25 mg at bedtime.  2. Prevacid 30 mg daily.  3. Lasix 20 mg daily when she has leg edema.  4. Neurontin 200 mg daily.  5. Lisinopril 10 mg daily.  6. Zyrtec 10 mg daily p.r.n.  7. Trazodone 50 mg daily.  8. Phenergan 25 mg p.r.n.  9. Tramadol 50 mg q.4-6h. p.r.n.  10.      K-Dur 20 mEq daily.  11.      Iron sulfate daily.   ALLERGIES:  1. PENICILLIN causes hives.  2. IV DYES cause heat throughout her whole body which lasts for months.   SOCIAL AND FAMILY HISTORY:  She lives with her father and her son.  She  smokes half a pack of tobacco per day.  She describes her alcohol use as  only occasional.  Last use of at a birthday party three months ago.  She  denies illicit drug use.  She has a strong family history of heart disease.  Her father had gallstones removed in 1965.  Her father is 16 years old, and  has high blood pressure, diabetes, hypertension, and had a four vessel CABG.  Her mother died at age 58.  She also had high blood pressure, diabetes, and  four vessel CABG.  She has one brother and sister.  They both have high  blood pressure, diabetes, and hypertension.   REVIEW OF SYSTEMS:  Significant only for severe painful neuropathy, and  hemorrhoids since the onset of this episode of diarrhea.   PHYSICAL EXAMINATION:  GENERAL:  This is a middle-aged African-American lady  lying on a stretcher.   She looks weak and dehydrated.  I am told that she  looks much better since she has received a liter of fluids.  Her history  sometimes get confused when discussing her alcohol intake.  VITAL SIGNS:  Temperature 98.1, currently 100.6 when admitted.  Her pulse  144 when first seen in the ER; now 123.  Her blood pressure was between  67/52 earlier, and now 103/48.  Respirations were 17 earlier, now 22.  HEENT:  She is pale, she is dehydrated, she is anicteric.  There is no  lymphadenopathy.  There is no jugular venous distention.  CHEST:  Clear to auscultation bilaterally.  CARDIOVASCULAR:  She is tachycardic.  There is no murmur.  ABDOMEN:  Soft and nontender.  EXTREMITIES:  No edema, and she is very tender to touch in her legs.  Unable  to assess her pulses.  RECTAL:  A couple of skin tags.  No visible hemorrhoids.  Stool - watery  dark stool.   LABORATORY DATA:  Hemoglobin 11.9, hematocrit 35.7, white count 30.4, MCV  87, and RDW 13.5.  There are toxic granulations in her white cells.  Sodium  is 133, potassium 2.8, chloride 103, CO2 16, BUN 8, creatinine 1.1.  Her  calcium is 9.2, and her glucose is 91.  No radiological studies have been  done as yet.   ASSESSMENT:  Chronic diarrhea in a middle-aged lady with a history of  pancreatitis who is hypokalemic and dehydrated from the chronic  gastrointestinal loss.  Leukocytosis cannot be associated with steroids, and  she has no history of recent steroid use.   PLAN:  Will rehydrate.  Will replace her potassium.  Will check a magnesium.  Will start on Flagyl, do stool for white blood cells, but still will also  start Cipro at the same time.  She will most likely need GI reassessment in  the morning.  Will keep her on a liquid diet after midnight.  Vania Rea, M.D.   LC/MEDQ  D:  09/30/2003  T:  09/30/2003  Job:  [id number]   cc:   Dr. Emeline Darling, [facility]   [name], M.D.   [address]  [place]  [place] [address]  Fax: [phone]

## 2011-02-16 NOTE — H&P (Signed)
NAME:  CHRISTEEN, LAI                          ACCOUNT NO.:  [id number]   MEDICAL RECORD NO.:  [id number]                   PATIENT TYPE:  OBV   LOCATION:  1827                                 FACILITY:  [facility]   PHYSICIAN:  Hettie Holstein, D.O.                 DATE OF BIRTH:  1961/05/22   DATE OF ADMISSION:  05/23/2004  DATE OF DISCHARGE:                                HISTORY & PHYSICAL   PHYSICIANS:  Primary care physician:  [facility].  Neurologist:  Pramod P. Pearlean Brownie, MD  Gastroenterologist:  Graylin Shiver, M.D.   CHIEF COMPLAINT:  Weight loss, nausea, abdominal and back pain.   HISTORY OF PRESENT ILLNESS:  This is a 50 year old African-American female,  poor historian, who is diabetic and has a prior history of pancreatitis and  alcohol abuse.  She states that she stopped earlier this year drinking  alcohol.  She was at her neurologist's office in evaluation of painful lower  extremity peripheral neuropathy, thought to be secondary to diabetes, who  was suggested to be evaluated in the emergency department after she reported  approximately 15-pound weight loss to her neurologist.   In the emergency department, she did report intractable nausea with a 15-  pound weight loss, poor appetite, and some stomach burning and discomfort.  She states that she has mid epigastric discomfort radiating to her back;  therefore, she has not been eating.  Most of her complaints are related to  her intense pain in her leg.   She does have a history of alcohol abuse, though she states she quit in  January 2005, and she states that she has been compliant with her  medications.  She states her blood sugars have been well controlled, mainly  because her diet has been quite poor.   She has a history of perirectal abscess that was managed by Dr. Carolynne Edouard, and  this has been followed up in the clinic approximately three weeks ago  without problems.  She does see Dr. Laural Benes, of gastroenterology, as  well.  She had a prior diagnosis of Crohn's colitis.  It was mentioned to her that  he felt that this may be a more nonspecific colitis.   PAST MEDICAL HISTORY:  1. Hospitalization earlier this year for perirectal abscess and     hyperglycemic event. She was on Lantus; however, this has been     discontinued.  2. History of pancreatitis.  3. Tobacco abuse.  4. History of alcohol abuse and stopped.  5. History of peripheral neuropathy.  6. IV DYE allergy.  7. She retains her gallbladder.  8. She is menopausal since this past year.   FAMILY HISTORY:  Noncontributory.   SOCIAL HISTORY:  The patient currently lives with her father.  She is not  working now.  She is quite weak.  She remains mostly quite sedentary.  She  has about one-half pack  per day smoking; she has been smoking for many  years.  She quit alcohol in January 2005 follow hospitalization at that  time.  She states that her appetite has been quite poor.   MEDICATIONS:  The patient is a quite poor historian, especially at this  time.  She has received some medications in the emergency department.  She  reports that she takes Lotensin, she believes 10 mg daily. Neurontin 300 mg  t.i.d.  Metformin, she said she quit taking.  1. Lantus, she quit as well as Amaryl, iron sulfate 325 mg p.o. t.i.d.  She     takes Prevacid 30 mg b.i.d.  She was on Ultram and Asacol for Crohn's and     predinsone; however, she quit at the direction of her primary     gastroenterologist.   ALLERGIES:  IV DYE as well as PENICILLIN.   PHYSICAL EXAMINATION:  VITAL SIGNS:  She was tachycardic with heart rate in  120s to 130s, respirations 18, blood pressure 111/82, O2 saturation on room  air 91%, temperature 97.2.  GENERAL:  She is a quite wasted-appearing, 50 year old African-American  female with a flat affect, quite full response and I am not certain if this  is her baseline.  I have seen her in the past, and she was a bit more  conversant and  aware.  HEENT:  She does reveal some bilateral temporal wasting.  NECK:  Supple and nontender.  CHEST:  Clear to auscultation bilaterally.  HEART:  Tachycardic, no appreciable murmur.  ABDOMEN:  Soft.  There is no rigidity, no rebound.  GENITOURINARY:  No costovertebral angle tenderness or suprapubic tenderness.  EXTREMITIES:  No edema.  There are quite exquisitely tender.  SKIN:  Not edematous, warm and dry.   LABORATORY AND X-RAY DATA:  UA was negative.  Sodium 131, potassium 3.5,  chloride 103, CO2 14, BUN 36, creatinine 1.4, glucose 176.  AST 11, ALT less  than 8, albumin 2.8, alkaline phosphatase 159, calcium 9.9, lipase 117.  WBC  25, hemoglobin 13, platelet count 665, MCV 83.   IMPRESSION:  1. Weight loss.  2. Intractable nausea.  3. Metabolic acidosis.  4. Leukocytosis.  5. Elevated lipase and suspect possible pancreatitis.  6. Hyponatremia.  7. Renal insufficiency.  8. Tachycardia, suspect reaction to volume depletion.  9. Peripheral neuropathy.  10.      Blunted affect.  11.      Granulomas noted in spleen.  12.      Hiatal hernia and gastroesophageal reflux disease.   PLAN:  1. Will go ahead and admit Mrs. Eichorn for IV hydration.  2. Will follow her course clinically.  3. She may have a component of gastroparesis.  I will check her gastric     emptying evaluation.  Will place her on antiemetics, place her on slowly     advanced diet, and consult nutrition.  4. In regard to leukocytosis, will check a chest x-ray to check for     infectious cause of her leukocytosis and perhaps may see suggestion of a     sarcoidosis, and may pursue further evaluation in that regard.  5. She does have underlying splenic granuloma.  This is differential.  Will     place a PPD.  6. We will follow her lipase and follow her CBGs while she is in hospital     and check her hemoglobin A1C.  If these studies are unrevealing, she may    need to undergo repeat  gastroenterology evaluation for  weight loss if     this is not explained by gastroparesis.  7. In any event, we will continue her other medications as well except for     Lotensin as she is probably volume depleted at this time.  8. Will repeat her electrolytes.                                                Hettie Holstein, D.O.    ESS/MEDQ  D:  05/23/2004  T:  05/23/2004  Job:  [id number]   cc:   [facility]   Pramod P. Pearlean Brownie, MD  Fax: [phone]   Graylin Shiver, M.D.  [address]  [address]  [place], [place] [address]  Fax: [phone]   [name], M.D.  [address]  [place]  [place] [address]  Fax: [phone]

## 2011-02-16 NOTE — Op Note (Signed)
NAME:  [name], [name]                          ACCOUNT NO.:  [id number]   MEDICAL RECORD NO.:  [id number]                   PATIENT TYPE:  INP   LOCATION:  5014                                 FACILITY:  [facility]   PHYSICIAN:  [name], M.D.                DATE OF BIRTH:  29-Nov-1960   DATE OF PROCEDURE:  11/10/2002  DATE OF DISCHARGE:                                 OPERATIVE REPORT   INDICATIONS FOR PROCEDURE:  The patient is a 50 year old female born 07/19/1961.  The patient was admitted to [facility] yesterday to evaluate  generalized weakness associated with a macrocytic anemia and guaiac-  positive, (malonic-appearing), stool.  The patient denies vomiting,  abdominal pain, hematemesis or hematochezia.  She does take nonsteroidal  anti-inflammatory medication.   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  Her screen for viral hepatitis A, B,  and C was negative.  Her abdominal ultrasound and abdominal CT scan was  consistent with fatty liver infiltration.   ALLERGIES:  Penicillin and intravenous contrast dye.   HOSPITAL MEDICATIONS:  1. Elavil.  2. Lotensin.  3. Protonix.  4. K-Dur.  5. Percocet.  6. Deseryl.   PAST MEDICAL HISTORY:  In January of 2002, [place] to  treat alcohol-induced hepatitis associated with guaiac-negative stool.  January of 2002, abdominal ultrasound revealed an enlarged liver,  gallbladder sludge, 8.2 mm common bile duct and mildly atrophic pancreas.  January of 2002, barium esophagram normal.  December of 2003, abdominal  ultrasound revealed mild hepatomegaly with a 4.7 mm common bile duct.  December of 2003, CT scan of the abdomen and pelvis was consistent with  fatty infiltration of the liver and probable uterine fibroid.  Positive  alcohol and tobacco use.  History of hypertension, uterine fibroid removed  surgically, cesarean section, allergic  rhinitis.   FAMILY HISTORY:  Positive for diabetes mellitus.   LABORATORY DATA:  CBC revealed a macrocytic anemia, 8.4 g.  Prothrombin time  and INR normal.  Complete metabolic profile normal except for a serum  potassium of 2.9, alkaline phosphatase of 256 and serum albumin 2.1.   PHYSICAL EXAMINATION:  GENERAL: The patient is alert, lying comfortably in  her bed, receiving a packed red blood cell transfusion.  HEENT: ? Proptosis.  Nonicteric sclerae.  LUNGS: Clear to auscultation.  CARDIAC: A regular rhythm without audible clips or murmurs.  ABDOMEN: Soft, flat and nontender.  No detectible ascites.  SKIN: Warm and dry.   ENDOSCOPIST:  [name], M.D.   PREMEDICATION:  Versed 5 mg, Demerol 50 mg.   ENDOSCOPE:  Olympus gastroscope.   DESCRIPTION OF PROCEDURE:  After obtaining informed consent, the patient was  placed in the left lateral decubitus position.  I administered intravenous  Demerol and intravenous Versed to achieve conscious sedation for the  procedure.  The patient's blood pressure,oxygen  saturation and cardiac  rhythm were monitored throughout the procedure and documented in the medical  record.   The Olympus gastroscope was passed, was introduced into the oropharynx and  advanced into the esophagus without difficulty.  The hypopharynx, larynx and  vocal cords appeared normal.   Esophagoscopy:  The proximal, mid and lower segments of the esophagus appear  completely normal.  I do not detect any bleeding from the esophagus or  inflammation of the esophagus by endoscopic exam.  There are no visible  esophageal varices.   Gastroscopy:  Retroflexed view of the gastric cardia and fundus was normal.  I do not detect any gastric varices.  The gastric body, antrum and pylorus  appeared completely normal.  There is no bleeding from the stomach and there  are no signs of peptic ulcer disease or portal hypertensive gastropathy.   Duodenoscopy:  The duodenal bulb,  mid duodenum and distal duodenum appeared  normal.   ASSESSMENT:  Normal esophagogastroduodenoscopy.   RECOMMENDATIONS:  Regular diet today.  Clear liquids and colonic lavage prep  11/11/02.  Colonoscopy 11/12/02.   Her thyroid function testing are pending.  I would also check her serum B12  and red blood cell and folate level given her macrocytic anemia.                                                [name], M.D.    MJ/MEDQ  D:  11/10/2002  T:  11/10/2002  Job:  [id number]   cc:   Ermalene Searing. Leander Rams, M.D.  [address] [place] [address]  Fax: [phone]

## 2011-02-16 NOTE — Consult Note (Signed)
NAME:  [name], [name]                          ACCOUNT NO.:  [id number]   MEDICAL RECORD NO.:  [id number]                   PATIENT TYPE:  INP   LOCATION:  5511                                 FACILITY:  [facility]   PHYSICIAN:  James L. Malon Kindle., M.D.          DATE OF BIRTH:  Dec 09, 1960   DATE OF CONSULTATION:  05/27/2004  DATE OF DISCHARGE:                                   CONSULTATION   REASON FOR CONSULTATION:  Nausea, vomiting, abdominal, and back pain.   HISTORY OF PRESENT ILLNESS:  A 50 year old [place] American female who is  quite a poor historian.  She is a diabetic.  Has a history of alcohol abuse  and pancreatitis.  She is seeing Dr. [name] for questionable history  of Crohn's disease.  Dr. [name] performed a colonoscopy on her back  in 2004 showing focal active colitis which was not clearly Crohn's disease  and could have been infectious.  At this point, I could not find any study  in the computer then because he has had documented Crohn's disease.  He  apparently has seen her in the office for colitis since then but she is  unable to tell me exactly what was done.  She also has marked gastroparesis  with gastric emptying showing 59% of activity in the stomach in two hours  with normal being less than 50%.  This is presumably due to her diabetes.  She has had intractable nausea with weight loss, poor appetite, abdominal  pain, epigastric pain going to the back, etc.  She reports that she quit  drinking in January and this has been compliant.   The patient on this admission has had a CT scan of the abdomen performed  showing thickened colon, possibly colitis in the prominent bowel duct going  to the ampulla which has been present before.  No gallstones were noted.  The patient's liver tests have been elevated in the past but currently her  liver tests are all normal other than albumin of 1.6.  Bilirubin, alkaline  phosphate, etc. are all normal.  Amylase  was slightly up at 137.  The  pancreas on CT scan was noted to be atrophic.  The patient denies any  diarrhea.  She says she has had normal bowel movements prior to this  admission.  She is being treated for leukocytosis with Cipro and Flagyl and  needs IV fluids.  She does have a temperature of 101 and down now to have a  PICC line in.  The patient is complaining of epigastric pain.   LABORATORY DATA:  Other pertinent labs remarkable for white count of 15 (it  was down from 25.3 on admission).  We were asked to see her.   CURRENT MEDICATIONS:  Reglan, Neurontin, insulin, Protonix, Lopressor,  Cipro, Flagyl, Zofran, oxycodone, Ultram, and Tylenol.   ALLERGIES:  The patient is allergic to IV DYE and PENICILLIN according  to  her records.   PAST MEDICAL HISTORY:  1. She has had a perirectal abscess earlier this year.  2. She has had a history of pancreatitis related to drinking but reportedly     has stopped as of this year.  3. She also has a history of peripheral neuropathy apparently related to     drinking.  4. She has a history of colitis.  At this point and time, it does not appear     to be classic Crohn's disease.   SOCIAL HISTORY:  She lives with her father.  Unable to work.  Lives a  sedentary life.  Continues to smoke.  Reported to have quit drinking in  January.   PHYSICAL EXAMINATION:  VITAL SIGNS:  Temperature maximum 101.  Vital signs  are unremarkable.  GENERAL:  The patient is an alert African American female.  Gives a very  poor history.  Sitting in a chair outside of radiology.  Has rambling  speech.  HEENT:  Sclerae nonicteric.  HEART:  Normal.  LUNGS:  Normal.  ABDOMEN:  Soft with mild epigastric tenderness.  Good bowel sounds.   ASSESSMENT:  1. Nausea, vomiting, epigastric pain questionably due to pancreatitis:     There is no other obvious cause on CT.  I think by treating this     empirically with several days of bowel rest would be appropriate.  2.  History of colitis.  3. Slightly thickened colon on CT.  4. History of alcohol abuse currently not drinking.   RECOMMENDATIONS:  Would continue to treat conservatively as you are with IV  fluids via the PICC line and antibiotics.  Follow her enzymes.  Depending on  her clinical course, we may ended up needing a colonoscopy.  Dr. [name] will decide that on Monday.                                               James L. Malon Kindle., M.D.    Waldron Session  D:  05/27/2004  T:  05/28/2004  Job:  [id number]   cc:   [name], M.D.  [address]  [place]  [place] [address]  Fax: [phone]   [facility]

## 2011-02-16 NOTE — Op Note (Signed)
NAME:  [name], [name]                          ACCOUNT NO.:  [id number]   MEDICAL RECORD NO.:  [id number]                   PATIENT TYPE:  INP   LOCATION:  5511                                 FACILITY:  [facility]   PHYSICIAN:  Petra Kuba, M.D.                 DATE OF BIRTH:  02-19-1961   DATE OF PROCEDURE:  05/29/2004  DATE OF DISCHARGE:                                 OPERATIVE REPORT   PROCEDURE:  Flexible sigmoidoscopy with biopsy.   INDICATIONS:  The patient with a history of Crohn's disease, abnormal CT  scan want to re-evaluate.  Consent was signed after the risks and benefits,  methods and options were discussed multiple times in the past and with me  prior to any premedicines given.   MEDICINES USED:  Demerol 80, Versed 9.   PROCEDURE:  Rectal inspection pertinent for external hemorrhoids.  Digital  exam was negative.  Video pediatric adjustable colonoscope was inserted,  easily advanced to 60 cm.  At that point, the scope began to loop.  The  patient experienced pain.  We elected to withdraw.  This was an unprepped  exam but there was liquid stool that could easily be washed and suctioned.  In the parts of the descending and proximal sigmoid seen, there were obvious  anthus ulcers, regular ulcerations and some pseudopolyps where multiple cold  biopsies were obtained.  The distal sigmoid and rectum was normal.  Anal  rectal pull through and retroflexion confirmed some obvious perirectal  disease and a small fistula was seen as well as some hemorrhoids.  We did  not biopsy this area.  The scope was straightened and re-advanced a short  ways up the sigmoid.  The air was suctioned, the scope removed.  The patient  tolerated the procedure well.  There was no obvious immediate complication.   ENDOSCOPIC DIAGNOSES:  1. Internal and external hemorrhoids with anal inflammation, fistulas and     perirectal probable Crohn's disease.  2. Normal rectum otherwise and distal sigmoid.  3. Multiple shallow ulcerations in the pseudopolyps in the proximal sigmoid     and descending, status post biopsy.  4. Otherwise exam unprepped with 60 cm.   PLAN:  Await pathology, restart Asacol, slowly advance diet and Dr. Randa Evens  to see tomorrow.                                               Petra Kuba, M.D.    MEM/MEDQ  D:  05/29/2004  T:  05/29/2004  Job:  [id number]   cc:   Fayrene Fearing L. Malon Kindle., M.D.  [address]  [place]  [place] [address]  Fax: [phone]   [name], M.D.  [address]  [address]  [place]  [place] [address]  Fax: [phone]   Hettie Holstein, D.O.  Fax: [phone]   [facility] Carleene Mains, M.D.

## 2011-02-16 NOTE — Op Note (Signed)
   NAME:  [name], [name]                          ACCOUNT NO.:  [id number]   MEDICAL RECORD NO.:  [id number]                   PATIENT TYPE:  INP   LOCATION:  0465                                 FACILITY:  [facility]   PHYSICIAN:  [name], M.D.                DATE OF BIRTH:  25-Mar-1961   DATE OF PROCEDURE:  12/28/2002  DATE OF DISCHARGE:                                 OPERATIVE REPORT   PROCEDURE:  Flexible proctosigmoidoscopy.   INDICATIONS:  The patient is a 50 year old female born 06/23/1961.  The patient  was hospitalized February 2004 with Hemoccult-positive stool.  November 10, 2002, esophagogastroduodenoscopy was normal.  November 11, 2002,  proctocolonoscopy to the cecum revealed generalized proctocolitis  endoscopically consistent with Crohn's disease.  The patient was treated  with Cipro and Flagyl.   The patient is readmitted with anemia and Hemoccult-positive stool.   DESCRIPTION OF PROCEDURE:  The patient was placed in the left lateral  decubitus position.  I administered intravenous Demerol 50 mg and  intravenous Versed 5 mg to achieve conscious sedation for the procedure.  Anal inspection was normal.  Digital rectal exam was normal.  The Olympus  pediatric colonoscope was introduced into the rectum and advanced to  approximately 70 cm from the anal verge.   Rectal mucosa was normal.  There are scattered aphthous-type ulcers with  patchy mucosal erythema throughout the left colon but endoscopically the  colitis appears much less intense compared with her colonoscopy 11/11/02.   ASSESSMENT:  Question resolving Crohn's colitis.   RECOMMENDATIONS:  Prednisone 40 mg each morning for two weeks, 35 mg each  morning for a week, 30 mg each morning for a week, 25 mg each morning for a  week, 20 mg each morning for a week, 15 mg each morning for a week, and  finally 10 mg each morning for a week, then discontinue prednisone.     [name], M.D.    MJ/MEDQ  D:  12/28/2002  T:  12/28/2002  Job:  [id number]   cc:   Ermalene Searing. Leander Rams, M.D.  [address], [place] [address]  Fax: [phone]

## 2011-02-16 NOTE — Consult Note (Signed)
NAME:  [name], [name]                          ACCOUNT NO.:  [id number]   MEDICAL RECORD NO.:  [id number]                   PATIENT TYPE:  INP   LOCATION:  5041                                 FACILITY:  [facility]   PHYSICIAN:  Graylin Shiver, M.D.                DATE OF BIRTH:  Jan 17, 1961   DATE OF CONSULTATION:  09/30/2003  DATE OF DISCHARGE:                                   CONSULTATION   REASON FOR CONSULTATION:  The patient is a 50 year old black female with a  history of pancolitis diagnosed by Dr. [name] in February 2004.  The  patient was admitted to the hospital now with diarrhea which had been  worsening over the past six weeks.  She had seen a little blood in the stool  early on when the diarrhea started but no recently.  The patient states that  she was not taking any specific medications for her colitis.  A prior  colonoscopy done in February 2004 by Dr. [name] showed aphthous  ulcers and it was suggestive of a pan universal colitis.  It was felt  endoscopically that this was Crohn's colitis.  Biopsies did not show  granulomas.  A small bowel series was done back in February 2004 which did  not show any evidence of ileitis.  A repeat flexible sigmoidoscopy done by  Dr. Laural Benes in March 2004 showed that there were aphthous ulcers but the  appearance was improved.   PAST MEDICAL HISTORY:  1. Colitis.  2. History of alcohol abuse.  3. 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  4. Depression.  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  6. Tobacco abuse.  7. Past medical noncompliance.  8. History of hypertension.  9. History of neuropathy.   MEDICATIONS PRIOR TO ADMISSION:  Elavil, Prevacid, Lasix, Neurontin,  lisinopril, Zyrtec, trazodone, Phenergan, Tramadol, K-Dur, iron sulfate.   ALLERGIES:  1. PENICILLIN.  2. IV DYES.   SOCIAL HISTORY:  Smokes half a pack of cigarettes a day.  States that she  only drinks occasional alcohol.   REVIEW OF SYSTEMS:   No complaints of chest pain, shortness of breath, cough,  or sputum production.   PHYSICAL EXAMINATION:  VITAL SIGNS:  Temperature 97.8, pulse 119,  respirations 20, blood pressure 115/62.  She does not appear in any acute  distress.  HEENT:  She is nonicteric.  NECK:  Supple.  HEART:  Regular rhythm.  No murmurs.  LUNGS:  Clear.  ABDOMEN:  Bowel sounds normal.  Soft, nontender, no hepatosplenomegaly.  EXTREMITIES:  No edema.   White blood cell count is elevated at 30.8, hemoglobin 11, hematocrit 33.1.   IMPRESSION:  Probable Crohn's colitis with reactivation of symptoms.  The  patient was not on any therapy prior to admission for her colitis.   PLAN:  The patient was started on Solu-Medrol.  This will be continued.  I  will begin Asacol  two pills t.i.d.  Stool will be checked for enteric  pathogens and Clostridium difficile toxin.  She will be followed clinically.  We will consider a repeat flexible sigmoidoscopy.                                               Graylin Shiver, M.D.    Germain Osgood  D:  09/30/2003  T:  09/30/2003  Job:  [id number]   cc:   [name], M.D.  [address]  [place]  [place] [address]  Fax: [phone]   Renato Battles, M.D.  Fax: [phone]

## 2011-02-16 NOTE — Discharge Summary (Signed)
NAME:  [name], [name]                          ACCOUNT NO.:  [id number]   MEDICAL RECORD NO.:  [id number]                   PATIENT TYPE:  INP   LOCATION:  5014                                 FACILITY:  [facility]   PHYSICIAN:  Deirdre Peer. Polite, M.D.              DATE OF BIRTH:  Jan 01, 1961   DATE OF ADMISSION:  11/09/2002  DATE OF DISCHARGE:  11/14/2002                                 DISCHARGE SUMMARY   DISCHARGE DIAGNOSES:  1. Crohn's colitis.  2. Anemia secondary to #1.  3. Malnourishment.  4. Hypertension.  5. History of alcohol abuse.   DISCHARGE MEDICATIONS:  1. Flagyl 250 three times a day for 10 days.  2. Ciprofloxacin 500 mg twice a day for 10 days.  3. Multivitamin daily.  4. Lotensin 20 mg daily.  5. Elavil 25 mg at bedtime.  6. Percocet 5/325 one to two tablets every six hours as needed for pain.   CONSULTATIONS:  Dr. [name], gastroenterology.   PROCEDURES:  1. A colonoscopy November 12, 2002 revealed universal proctocolitis with     red, friable mucosa with deep ulcers.  Biopsies revealed focal active     mucosal colitis.  2. Esophagogastroduodenoscopy November 10, 2002:  Normal.  3. A small bowel study November 12, 2002:  No obstruction, no definite     evidence of small bowel Crohn's.  4. Abdominal flat plain November 12, 2002:  Appears to be some persistent     dilation of distal small bowel loops, definite evidence of inflammatory     bowel disease.  5. A chest x-ray November 09, 2002:  No active cardiopulmonary disease.  6. CT of the abdomen revealed pancolitis, markedly fatty liver and diffuse     subcutaneous edema.  7. CT of the pelvis November 10, 2002 revealed pancolitis.   LABORATORY DATA:  Sodium 143, potassium 4, chloride 112, CO2 19, BUN 7,  creatinine 1.1.  Total bilirubin 1.5, alkaline phosphatase 280, ALT 36, AST  29, total protein 5.3, albumin 2.1, calcium 7.5.  TSH 3.559.  Urinalysis was  negative.  WBC 12, RBC 4.06, hemoglobin 13.7,  hematocrit 40.4, MCV 99.4,  platelets 229.   DISPOSITION:  The patient will be discharged home.   CONDITION ON DISCHARGE:  Stable.   HISTORY OF PRESENT ILLNESS:  A 50 year old female who presented to [facility] with complaints of weakness for several weeks.  The patient stated that  she was in her usual state of health until a few weeks ago when she started  having weakness.  She also noticed black tarry stools for a few days.  She  reported occasional nausea and vomiting, no abdominal pain.  Admission  examination revealed heme-positive stools with hemoglobin of 8.4.  Potassium  was low at 2.9, alkaline phosphatase was elevated at 256.  The patient was  admitted for further evaluation and treatment.   HOSPITAL COURSE:  Problem  1:  CROHN'S COLITIS WITH ANEMIA:  The patient was  admitted, typed and crossed, and transfused with 2 units of packed RBC's.  A  GI consult was obtained.  The patient was seen by Dr. [name].  CT of  the abdomen revealed pancolitis.  CT of the abdomen and pelvis revealed  pancolitis.  The patient underwent an EGD on November 10, 2002 which was  normal.  He proceeded to do a colonoscopy on November 12, 2002 with  universal proctocolitis noted.  Biopsies were sent with results noted as  above.  Small bowel study was done on February 12 with no small bowel  Crohn's noted.  The patient was placed on Cipro and Flagyl.  Her hemoglobin  remained stable.  Discharge hemoglobin is 13.7.  The patient had no nausea,  vomiting, or diarrhea or constipation.  She is able to tolerate a regular  diet, although her appetite is decreased.  She is being discharged on an  additional 10 days of Flagyl and Cipro and is to follow up with Dr. [name] on an outpatient basis.   Problem 2:  MALNOURISHMENT:  The patient has been encouraged to increase her  p.o. intake, instructed to take a multivitamin daily.   Problem 3:  HYPERTENSION:  Controlled throughout her  hospitalization.  The  patient is being discharged on her previous outpatient medications.   Problem 4:  ALCOHOL ABUSE:  The patient has been advised to abstain from  alcohol, at least while on Flagyl.  She has been educated on a possible  disulfiram reaction between alcohol and Flagyl.   DISCHARGE INSTRUCTIONS AND FOLLOWUP:  The patient is to follow up with Dr.  [name] in approximately 1-2 weeks and to follow up with her primary  care physician, Dr. Duke Salvia at [facility], in 1-2 weeks.     [name] Swazil[place], NP                      Deirdre Peer. Polite, M.D.    SJ/MEDQ  D:  11/14/2002  T:  11/14/2002  Job:  [id number]   cc:   Duke Salvia, M.D.  [facility]

## 2011-02-16 NOTE — Discharge Summary (Signed)
NAME:  [name], [name]                          ACCOUNT NO.:  [id number]   MEDICAL RECORD NO.:  [id number]                   PATIENT TYPE:  INP   LOCATION:  5041                                 FACILITY:  [facility]   PHYSICIAN:  Ara [name]. Tammi Klippel, M.[name].                DATE OF BIRTH:  12-27-1960   DATE OF ADMISSION:  09/29/2003  DATE OF DISCHARGE:  10/03/2003                                 DISCHARGE SUMMARY   PRIMARY CARE PHYSICIAN:  Dr. Emeline Darling at [facility].   CONSULTING PHYSICIAN:  Drs. Santogade and [facility].   FINAL DIAGNOSES:  1. Crohn's colitis.  2. Hypokalemia now resolved.  3. History of alcohol abuse, no signs of withdrawal.  4. Anemia.  5. Depression.  6. Tobacco abuse.  7. Hypertension.  8. 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  10.      Status post cesarean section in 1996.  11.      History of uterine fibroid removal.   FINAL PROCEDURES:  1. Abdomen ultrasound performed October 04, 2003 showing stable chronic     problems with extrahepatic biliary system.  Gallbladder is contracted and     suboptimally visualized, interval improvement in diffusely increased     echogenicity compatible with improving fatty infiltration; no acute     findings demonstrated.  2. Acute abdominal series performed September 20, 2003 showing questionable     left colon wall thickening, no evidence of bowel obstruction or     pneumoperitoneum, no evidence of acute cardiopulmonary disease.   PERTINENT LABS AND OTHER TEST RESULTS:  On the day of discharge sodium is  141, potassium 4.7, chloride 10, CO2 24, BUN 4, creatinine 0.8, glucose 324,  calcium 8.1, albumin 2.3.  Hemoglobin A1C 6.6.  TSH 0.949, lipase 15.  White  blood cell count is 21,100, H&H 10.3/31 with a platelet count of 660,000.  Stool fungus culture is showing no yeast or fungal element seen.  Fecal  white blood cells showing a few predominantly polymorphic nuclear  leukocytes.   SUMMARY OF HOSPITAL COURSE BY SYSTEMS:  The patient is a pleasant 50-year-  old African-American female with past medical history as listed above who  was admitted on 09/29/2003 for six weeks of diarrhea, dehydration and  hypokalemia.  1. Neuro-psyche:  There were no episodes of psychosis or mania or withdrawal     during admission though she does have listed on her medical history of     having a questionable personality disorder, there were no difficulties     during admission.   1. Pulmonary:  No active issues.   1. Cardiovascular:  The patient has a history of hypertension and had been     on Lisinopril, however, due to her low blood pressures this was held and     it can be restarted as an outpatient.  2. Renal:  There  were no active issues.  3. GI:  The patient was admitted with fluid rehydrated and started on Solu-     Medrol for control of her Crohn's colitis.  She was seen by Dr. Luther Parody     and by Dr. Laural Benes who had some difficulty in relating to the patient     that she did have Crohn's disease.  The patient was kept on Solu-Medrol     and then switched over to p.o. Prednisone and will be sent home with a     long taper.  She was also started on Asacol which she tolerated quite     well.  The patient had instructions to follow up with GI on a p.r.n.     basis and at the discretion of her primary care physician.   1. Fluid, electrolytes, nutrition:  The patient was admitted and volume     resuscitated.  Her potassium was replaced by IV and p.o. routes and her     magnesium was 6 on the final day of admission.  Her diet was slowly     advanced to a regular diet and there were no other active issues during     this admission.   1. Infectious disease.  The patient was initially started on ciprofloxacin     for her acute Crohn's, however she will be sent home on an ultimately 12-     week course of Metronidazole.  During admission she did have one or two      low-grade temperatures, however this was felt to be secondary to her     inflammatory bowel disease as she did not exhibit any other signs of     infection.   1. Hematology/oncology:  The patient had a marked leukocytosis which is felt     to be secondary to steroids.  She does have anemia of chronic disease     which has been chronic and presumed to be secondary to alcohol use.  She     also had a reactive thrombocytosis.   1. Endocrine:  The patient does have a mild steroid induced hyperglycemia.     Once the patient has been weaned off her steroids this can be addressed     further as an outpatient.  Her TSH was normal and her A1C was mildly elevated.   1. Prophylaxis:  The patient was kept on a PPI for GI prophylaxis and also     was also  full p.o.  She was also completely ambulatory for deep venous thrombosis  prophylaxis.   DISPOSITION:  The patient is being discharged home in good condition.   DISCHARGE MEDICATIONS:  1. Amitriptyline 25 mg p.o. q.h.s.  2. Gabapentin 300 mg p.o. q.h.s.  3. Asacol 400 mg three tablets p.o. q.a.m., three tablets p.o. q.p.m.  4. Metronidazole 250 mg one tablet p.o. b.i.[name]. x6 weeks then one tablet p.o.     daily x6 weeks.  5. Prednisone 10 mg two tablets p.o. t.i.[name]. times one week then 4 tablets     p.o. q.a.m. x2 weeks, then 3-1/2 tablets p.o. q.a.m. times one week, then     3 tablets p.o. q.a.m. times one week, then 2-1/2 tablets p.o. q.a.m.     times one week, then 2 tablets p.o. q.a.m. times one week then 1-1/2     tablets p.o. q.a.m. times one week, then one tablet p.o. q.a.m. times one     week and then stop.  6. Lisinopril 10  mg p.o. daily.  7. Diphenoxylate/Atropine two tabs p.o. q.i.[name]. p.r.n. diarrhea.   DISCHARGE INSTRUCTIONS:  1. The patient is to take her medications as prescribed and completely.  2. She is to return if she feels worse.  3. She is to follow up with Dr. with Dr. Emeline Darling at [facility] in     approximately two  weeks. 4. She is to refrain from tobacco and alcohol.  5. She is to follow up with Dr. Laural Benes at the discretion of Dr. Emeline Darling.                                                Ara [name]. Tammi Klippel, M.[name].    ADM/MEDQ  [name]:  10/05/2003  T:  10/05/2003  Job:  [id number]   cc:   Emeline Darling, M.[name].  [facility]

## 2011-02-16 NOTE — Discharge Summary (Signed)
NAME:  [name], [name]                          ACCOUNT NO.:  [id number]   MEDICAL RECORD NO.:  [id number]                   PATIENT TYPE:  INP   LOCATION:  0465                                 FACILITY:  [facility]   PHYSICIAN:  Deirdre Peer. Polite, M.D.              DATE OF BIRTH:  25-Oct-1960   DATE OF ADMISSION:  12/21/2002  DATE OF DISCHARGE:  01/02/2003                                 DISCHARGE SUMMARY   DISCHARGE DIAGNOSES:  1. Pancolitis improved at time of discharge for outpatient followup with     [name], M.D.  2. History of alcohol abuse, no signs of withdrawal.  3. Critical anemia secondary to gastrointestinal loss.  Discharge hemoglobin     of 10.6.  4. Alcoholic hepatitis resolved.  5. Malnutrition improved, status post total nutrient admixture from March     26th to April 2nd.  6. Depression/questionable personality disorder, outpatient treatment     followup.  The patient seen and evaluated by Dr. Jeanie [name] and deemed     competent to make appropriate decisions.  7. Tobacco abuse, recommend abstinence.  8. Medical noncompliance.   DISCHARGE MEDICATIONS:  1. Prednisone 10 mg two tabs daily for one week then one tab daily for one     week.  2. Protonix 40 mg one daily.  3. Multivitamin one daily.  4. Ferrous sulfate 325 mg one every 8 hours.  5. Neurontin 300 mg one daily.  6. Megace 10 mL daily.   DISPOSITION:  The patient is discharged in fair condition.  She is  discharged to home.  She refused nursing home placement.  Also refused home  health followup.  The patient is asked to follow up with [name],  M.D. of [facility] on an outpatient basis and is also asked to follow up with  [facility] for further medical treatment.   CONSULTANTS:  1. Antonietta Breach, M.D. from psychiatry.  2. [name], M.D., [facility].   STUDIES:  The patient had several CBCs with a nadir of 7, discharge  hemoglobin of 10.3 status post transfusion.  BMET within normal  limits.  HIV  negative.   She had an EGD on March 29th which revealed scattered aphthosis-type ulcers,  patchy erythema, normal rectum.   HISTORY OF PRESENT ILLNESS:  A 50 year old black female, history of  pancolitis, hypertension, alcohol abuse presented to the ED for complaints  of weakness, shortness of breath, thought to have critical anemia of 7.5.  Of note, the patient recently had been admitted through [facility]  where she was admitted for critical anemia at that time and found to have  pancolitis.  Since discharge the patient denied a loss of blood per rectum  but did admit to occasional NSAID use.  Admission was deemed necessary for  further evaluation and treatment.   PAST MEDICAL HISTORY:  As stated above.   MEDICATIONS ON ADMISSION:  From prior discharge summary include Phenergan,  lisinopril, Vioxx, amitriptyline.   SOCIAL HISTORY:  Positive for alcohol, tobacco.  No drugs.   PAST SURGICAL HISTORY:  Negative.  The patient does have history of C-  section approximately 7 years ago, fibroids removed in 1985, and describes  oophorectomy in 1985 as well.   ALLERGIES:  The patient describes an ALLERGY to IV DYE.   PHYSICAL EXAMINATION ON ADMISSION:  GENERAL:  The patient was cachetic in  appearance.  VITAL SIGNS:  Afebrile, hemodynamically stable, saturation of 96%.  HEENT:  The patient had pale sclerae, dry oral mucosa.  NECK:  No thyromegaly.  CHEST:  Clear.  CARDIOVASCULAR:  Regular.  ABDOMEN:  Scaphoid.  EXTREMITIES:  No edema.  BREASTS:  Fibrocystic changes.  RECTAL:  Deferred, per ED heme-negative.  LYMPHATICS:  Right axillary there was a submucosal soft mass, appears to be  mobile at 1.5 x 1.5 cm which the patient states has been there greater than  20 years.   HOSPITAL COURSE:  Problem 1.  CRITICAL ANEMIA:  The patient was admitted to  a medicine floor bed for evaluation and treatment of critical anemia.  She  received packed red blood cells, GI  consult was obtained.  Of note, the  patient has had a GI consultation in the past in which she has had abnormal  EGD, pancolitis via colo, and a normal small-bowel follow-through.  During  this hospitalization, the patient did have a repeat colonoscopy as stated in  the study section.  Treatment recommended for the patient's colitis was  prednisone by [name], M.D. and course to taper over six weeks.  However, with the patient's significant noncompliance did not feel it safe  to give the patient this much prednisone to be discharged on as she will  have a curtailed dosing of this medicine.  She has been in the hospital for  approximately two weeks, discharge to home will be 20 mg daily for a week  and then 10 mg daily for a week.  During this hospitalization her course has  been one of slow progress with essential __________ .  The patient was  started on TNA which was ultimately discontinued as she tolerated p.o.  intake.  Because of her high risk and history of noncompliance psychiatry  was asked to see the patient as well and deemed the patient competent to  make decisions despite not making the correct decisions.  It has also been  offered to the patient to pursue followup in a nursing home setting which  she refused.  After that [facility] were offered and the patient  refused.  There were no major complications during her hospitalization other  than the malnutrition.  The patient appears for noncompliance for leaving  the floor to go smoke.  The patient has mild leukocytosis at this time and  it is not felt to be an infectious etiology but to be secondary to steroids  and expected to resolve.  This will be followed up on an outpatient basis at  [facility] if the patient follows up.  At this time, the patient is  medically stable for discharge to home.  She is expected to follow up at  [facility].  She has refused home health services as of April 2nd.   If she desires, this will be provided at her request.   Problem 2.  NEUROPATHY:  The patient throughout this hospitalization  describes sensation of numbness  in her lower extremities.  B12, folate were  within normal limits.  On further questioning the patient states that it has  been a prolonged problem and as to her extended alcoholism it is presumed  that her neuropathy is secondary to alcohol use.  The patient has been  started on Neurontin and going to be asked to continue this.  She is going  to be followed up on an outpatient basis.   Problem 3.  LEUKOCYTOSIS:  As stated, during this hospitalization the  patient had mild elevation in WBC, white count of approximately 15,000.  There are no signs of infection and as stated this is presumed to be  secondary to steroids.  This is going to be followed up on an outpatient  basis.   Problem 4.  MALNUTRITION:  The patient essentially does not describe any  symptoms of nausea, vomiting, or diarrhea or abdominal pain and it is not  felt that her symptoms are related to malabsorption.  Felt her symptoms are  primarily a component of failure to thrive, depression, and continued  alcoholism.  The patient will be discharged with Megace and this is expected  to help stimulate her appetite.  During this hospitalization, appetite  increased as TNA was decreased.  The patient has several chronic medical  problems which are stable.  The patient was continued on her current  outpatient medications.                                               Deirdre Peer. Polite, M.D.    RDP/MEDQ  D:  01/02/2003  T:  01/03/2003  Job:  [id number]   cc:   [name], M.D.  [address]  [place]  [place] [address]  Fax: [phone]   Dr. Emeline Darling, [facility]

## 2011-02-16 NOTE — Discharge Summary (Signed)
NAME:  [name], [name]                          ACCOUNT NO.:  [id number]   MEDICAL RECORD NO.:  [id number]                   PATIENT TYPE:  INP   LOCATION:  5735                                 FACILITY:  [facility]   PHYSICIAN:  Hettie Holstein, D.O.                 DATE OF BIRTH:  02-Mar-1961   DATE OF ADMISSION:  10/26/2003  DATE OF DISCHARGE:  11/02/2003                                 DISCHARGE SUMMARY   PRIMARY CARE PHYSICIAN:  [facility].   ADMISSION DIAGNOSES:  1. Hyperglycemia.  2. Leukocytosis.  3. New-onset diabetes mellitus.  4. Abdominal pain.   DISCHARGE DIAGNOSES:  1. Hyperglycemia.  2. Leukocytosis.  3. New-onset diabetes mellitus.  4. Abdominal pain.  5. Rectal abscess, status post incision and drainage per Dr. Ollen Gross. [name].   MEDICATIONS ON DISCHARGE:  As per Dr. Danae Chen:  1. One week of Flagyl and Cipro p.o.  2. Lantus 15 units subcu nightly.  3. Elavil 125 mg nightly.  4. Amaryl 15 mg twice daily.  5. Metformin 2 tablets in the morning and 1 tablet in the evening with     meals.  6. Neurontin 300 mg b.i.d.  7. Aspirin 81 mg daily.   DIET:  Diet as tolerated, ADA, 1600 calorie recommended.   FOLLOWUP:  The patient was instructed to follow up with Dr. Carolynne [name] in 2 weeks  and she was provided with a phone number.   HISTORY OF PRESENTING ILLNESS:  This is a 50 year old female with a past  medical history of Crohn's colitis, history of alcohol abuse and history of  medical noncompliance, also peripheral neuropathy, who presented with some  vague abdominal discomfort radiating to her back.  She was quite a difficult  historian upon admission.  She was noted to be hyperglycemic in the  emergency department without prior history of diabetes.  She had an elevated  white count and was hyperglycemic with a blood glucose of 989.   HOSPITAL COURSE:  She was admitted for IV hydration and glycemic control, as  well as further evaluation of her abdominal pain.   She underwent CT  evaluation and it was noted that she had a possible rectal abscess and a  cystic-type structure; this was not seen on prior scans in February 2004.  Blood glucoses were better controlled and she was taken off her Glucomander.  She was evaluated by Dr. Carolynne [name].  Her affect became improved and the patient  was much more communicative.  She was then placed on IV antibiotics with  resolution of her leukocytosis, better glycemic control as well as decreased  temperatures.  Her hospital course was otherwise uneventful with the  exception of some joint pains without radiographic evidence of acute  findings.  Her course was one of improvement and discharge home was  recommended from a surgical standpoint and she was discharged by Dr.  Ulyess Mort with followup  instructions.                                                Hettie Holstein, D.O.    ESS/MEDQ  D:  11/29/2003  T:  11/30/2003  Job:  [id number]   cc:   Dala Dock

## 2011-07-16 ENCOUNTER — Other Ambulatory Visit: Payer: Self-pay | Admitting: Internal Medicine

## 2011-07-16 LAB — CBC
Hemoglobin: 10.3 — ABNORMAL LOW
MCHC: 33.2
MCHC: 33.5
Platelets: 198
RBC: 3.41 — ABNORMAL LOW
RDW: 15 — ABNORMAL HIGH
WBC: 13.1 — ABNORMAL HIGH

## 2011-07-16 LAB — BASIC METABOLIC PANEL
BUN: 3 — ABNORMAL LOW
CO2: 23
Calcium: 7.2 — ABNORMAL LOW
Creatinine, Ser: 0.66
GFR calc non Af Amer: >60
Glucose, Bld: 87

## 2011-07-17 LAB — URINE MICROSCOPIC-ADD ON

## 2011-07-17 LAB — URINALYSIS, ROUTINE W REFLEX MICROSCOPIC
Bilirubin Urine: NEGATIVE
Glucose, UA: NEGATIVE
Ketones, ur: NEGATIVE
Protein, ur: NEGATIVE

## 2011-07-17 LAB — CROSSMATCH: Antibody Screen: NEGATIVE

## 2011-07-17 LAB — BASIC METABOLIC PANEL
BUN: 15
CO2: 22
Calcium: 7.4 — ABNORMAL LOW
Chloride: 112
Creatinine, Ser: 0.72
GFR calc Af Amer: >60
Glucose, Bld: 88
Potassium: 5.1

## 2011-07-17 LAB — I-STAT 8, (EC8 V) (CONVERTED LAB)
BUN: 20
Chloride: 112
Glucose, Bld: 104 — ABNORMAL HIGH
HCT: 17 — ABNORMAL LOW
Hemoglobin: 5.8 — CL
Operator id: 196461
Potassium: 5
Sodium: 140

## 2011-07-17 LAB — HEMOGLOBIN AND HEMATOCRIT, BLOOD
HCT: 32.8 — ABNORMAL LOW
HCT: 34.2 — ABNORMAL LOW
Hemoglobin: 11 — ABNORMAL LOW

## 2011-07-17 LAB — COMPREHENSIVE METABOLIC PANEL
ALT: 9
Albumin: 2.4 — ABNORMAL LOW
Alkaline Phosphatase: 53
Potassium: 4.9
Sodium: 138
Total Protein: 4.7 — ABNORMAL LOW

## 2011-07-17 LAB — CBC
Hemoglobin: 5.7 — CL
MCHC: 34.2
MCV: 89.9
Platelets: 188
RBC: 1.87 — ABNORMAL LOW
RBC: 3.69 — ABNORMAL LOW
WBC: 15.3 — ABNORMAL HIGH

## 2011-07-17 LAB — DIFFERENTIAL
Eosinophils Absolute: 0.2
Lymphs Abs: 5.3 — ABNORMAL HIGH
Monocytes Absolute: 0.5
Monocytes Relative: 3
Neutro Abs: 9.2 — ABNORMAL HIGH
Neutrophils Relative %: 60

## 2011-07-17 LAB — CULTURE, BLOOD (ROUTINE X 2)

## 2011-07-17 LAB — APTT: aPTT: 27

## 2011-07-17 LAB — PROTIME-INR: INR: 1

## 2011-07-17 LAB — OCCULT BLOOD X 1 CARD TO LAB, STOOL: Fecal Occult Bld: POSITIVE

## 2011-07-29 ENCOUNTER — Inpatient Hospital Stay (HOSPITAL_COMMUNITY)
Admission: EM | Admit: 2011-07-29 | Discharge: 2011-08-03 | DRG: 438 | Disposition: A | Discharge status: Home Health | Payer: Medicare Other | Attending: Internal Medicine | Admitting: Internal Medicine

## 2011-07-29 ENCOUNTER — Emergency Department (HOSPITAL_COMMUNITY): Payer: Medicare Other

## 2011-07-29 DIAGNOSIS — K5010 Crohn's disease of large intestine without complications: Secondary | ICD-10-CM | POA: Diagnosis present

## 2011-07-29 DIAGNOSIS — F17200 Nicotine dependence, unspecified, uncomplicated: Secondary | ICD-10-CM | POA: Diagnosis present

## 2011-07-29 DIAGNOSIS — E876 Hypokalemia: Secondary | ICD-10-CM | POA: Diagnosis present

## 2011-07-29 DIAGNOSIS — Z79899 Other long term (current) drug therapy: Secondary | ICD-10-CM

## 2011-07-29 DIAGNOSIS — I959 Hypotension, unspecified: Secondary | ICD-10-CM | POA: Diagnosis present

## 2011-07-29 DIAGNOSIS — E43 Unspecified severe protein-calorie malnutrition: Secondary | ICD-10-CM | POA: Diagnosis present

## 2011-07-29 DIAGNOSIS — R197 Diarrhea, unspecified: Secondary | ICD-10-CM | POA: Diagnosis present

## 2011-07-29 DIAGNOSIS — Z681 Body mass index (BMI) 19 or less, adult: Secondary | ICD-10-CM

## 2011-07-29 DIAGNOSIS — E872 Acidosis, unspecified: Secondary | ICD-10-CM | POA: Diagnosis present

## 2011-07-29 DIAGNOSIS — H409 Unspecified glaucoma: Secondary | ICD-10-CM | POA: Diagnosis present

## 2011-07-29 DIAGNOSIS — Z880 Allergy status to penicillin: Secondary | ICD-10-CM

## 2011-07-29 DIAGNOSIS — K859 Acute pancreatitis without necrosis or infection, unspecified: Principal | ICD-10-CM | POA: Diagnosis present

## 2011-07-29 DIAGNOSIS — I10 Essential (primary) hypertension: Secondary | ICD-10-CM | POA: Diagnosis present

## 2011-07-29 DIAGNOSIS — E785 Hyperlipidemia, unspecified: Secondary | ICD-10-CM | POA: Diagnosis present

## 2011-07-29 DIAGNOSIS — D72829 Elevated white blood cell count, unspecified: Secondary | ICD-10-CM | POA: Diagnosis present

## 2011-07-29 DIAGNOSIS — R64 Cachexia: Secondary | ICD-10-CM | POA: Diagnosis present

## 2011-07-29 DIAGNOSIS — E1142 Type 2 diabetes mellitus with diabetic polyneuropathy: Secondary | ICD-10-CM | POA: Diagnosis present

## 2011-07-29 DIAGNOSIS — E1149 Type 2 diabetes mellitus with other diabetic neurological complication: Secondary | ICD-10-CM | POA: Diagnosis present

## 2011-07-29 DIAGNOSIS — D638 Anemia in other chronic diseases classified elsewhere: Secondary | ICD-10-CM | POA: Diagnosis present

## 2011-07-29 DIAGNOSIS — F341 Dysthymic disorder: Secondary | ICD-10-CM | POA: Diagnosis present

## 2011-07-29 DIAGNOSIS — F1010 Alcohol abuse, uncomplicated: Secondary | ICD-10-CM | POA: Diagnosis present

## 2011-07-29 LAB — URINALYSIS, ROUTINE W REFLEX MICROSCOPIC
Nitrite: NEGATIVE
Specific Gravity, Urine: 1.024 (ref 1.005–1.030)
Urobilinogen, UA: 0.2 mg/dL (ref 0.0–1.0)
pH: 6 (ref 5.0–8.0)

## 2011-07-29 LAB — CBC
Hemoglobin: 12.5 g/dL (ref 12.0–15.0)
Platelets: 301 10*3/uL (ref 150–400)
RBC: 4.05 MIL/uL (ref 3.87–5.11)
WBC: 15.8 10*3/uL — ABNORMAL HIGH (ref 4.0–10.5)

## 2011-07-29 LAB — APTT: aPTT: 32 seconds (ref 24–37)

## 2011-07-29 LAB — COMPREHENSIVE METABOLIC PANEL
AST: 21 U/L (ref 0–37)
BUN: 9 mg/dL (ref 6–23)
CO2: 11 mEq/L — ABNORMAL LOW (ref 19–32)
Calcium: 8.8 mg/dL (ref 8.4–10.5)
Creatinine, Ser: 0.91 mg/dL (ref 0.50–1.10)
GFR calc Af Amer: 83 mL/min — ABNORMAL LOW (ref >90)
GFR calc non Af Amer: 71 mL/min — ABNORMAL LOW (ref >90)
Glucose, Bld: 81 mg/dL (ref 70–99)

## 2011-07-29 LAB — LIPASE, BLOOD: Lipase: 200 U/L — ABNORMAL HIGH (ref 11–59)

## 2011-07-29 LAB — DIFFERENTIAL
Basophils Absolute: 0 10*3/uL (ref 0.0–0.1)
Basophils Relative: 0 % (ref 0–1)
Eosinophils Absolute: 0 10*3/uL (ref 0.0–0.7)
Neutro Abs: 12.2 10*3/uL — ABNORMAL HIGH (ref 1.7–7.7)
Neutrophils Relative %: 77 % (ref 43–77)

## 2011-07-29 LAB — URINE MICROSCOPIC-ADD ON

## 2011-07-29 LAB — POCT I-STAT TROPONIN I: Troponin i, poc: 0.03 ng/mL (ref 0.00–0.08)

## 2011-07-29 LAB — LACTIC ACID, PLASMA: Lactic Acid, Venous: 1 mmol/L (ref 0.5–2.2)

## 2011-07-30 ENCOUNTER — Emergency Department (HOSPITAL_COMMUNITY): Payer: Medicare Other

## 2011-07-30 ENCOUNTER — Inpatient Hospital Stay (HOSPITAL_COMMUNITY): Payer: Medicare Other

## 2011-07-30 DIAGNOSIS — E872 Acidosis: Secondary | ICD-10-CM

## 2011-07-30 DIAGNOSIS — A419 Sepsis, unspecified organism: Secondary | ICD-10-CM

## 2011-07-30 LAB — COMPREHENSIVE METABOLIC PANEL
ALT: 14 U/L (ref 0–35)
CO2: 15 mEq/L — ABNORMAL LOW (ref 19–32)
Calcium: 7.4 mg/dL — ABNORMAL LOW (ref 8.4–10.5)
Chloride: 110 mEq/L (ref 96–112)
Creatinine, Ser: 0.71 mg/dL (ref 0.50–1.10)
GFR calc Af Amer: >90 mL/min (ref >90)
GFR calc non Af Amer: >90 mL/min (ref >90)
Glucose, Bld: 115 mg/dL — ABNORMAL HIGH (ref 70–99)
Total Bilirubin: 0.3 mg/dL (ref 0.3–1.2)

## 2011-07-30 LAB — PROTIME-INR
INR: 1.02 (ref 0.00–1.49)
Prothrombin Time: 13.6 seconds (ref 11.6–15.2)

## 2011-07-30 LAB — CARDIAC PANEL(CRET KIN+CKTOT+MB+TROPI)
CK, MB: 7 ng/mL (ref 0.3–4.0)
Relative Index: 4 — ABNORMAL HIGH (ref 0.0–2.5)
Total CK: 168 U/L (ref 7–177)
Troponin I: <0.3 ng/mL (ref <0.30)

## 2011-07-30 LAB — URINALYSIS, MICROSCOPIC ONLY
Ketones, ur: 15 mg/dL — AB
Nitrite: NEGATIVE
Urobilinogen, UA: 0.2 mg/dL (ref 0.0–1.0)
pH: 6 (ref 5.0–8.0)

## 2011-07-30 LAB — CBC
HCT: 33 % — ABNORMAL LOW (ref 36.0–46.0)
Hemoglobin: 11.9 g/dL — ABNORMAL LOW (ref 12.0–15.0)
MCH: 31.2 pg (ref 26.0–34.0)
MCV: 86.6 fL (ref 78.0–100.0)
RBC: 3.81 MIL/uL — ABNORMAL LOW (ref 3.87–5.11)

## 2011-07-30 LAB — VITAMIN B12: Vitamin B-12: 1940 pg/mL — ABNORMAL HIGH (ref 211–911)

## 2011-07-30 LAB — PHOSPHORUS
Phosphorus: 1.9 mg/dL — ABNORMAL LOW (ref 2.3–4.6)
Phosphorus: 1.7 mg/dL — ABNORMAL LOW (ref 2.3–4.6)

## 2011-07-30 LAB — BASIC METABOLIC PANEL
BUN: 5 mg/dL — ABNORMAL LOW (ref 6–23)
CO2: 14 mEq/L — ABNORMAL LOW (ref 19–32)
Calcium: 7.4 mg/dL — ABNORMAL LOW (ref 8.4–10.5)
Chloride: 112 mEq/L (ref 96–112)
GFR calc non Af Amer: >90 mL/min (ref >90)
Glucose, Bld: 102 mg/dL — ABNORMAL HIGH (ref 70–99)
Potassium: 3.1 mEq/L — ABNORMAL LOW (ref 3.5–5.1)
Potassium: 3.7 mEq/L (ref 3.5–5.1)
Sodium: 140 mEq/L (ref 135–145)

## 2011-07-30 LAB — DIFFERENTIAL
Eosinophils Absolute: 0.1 10*3/uL (ref 0.0–0.7)
Lymphocytes Relative: 20 % (ref 12–46)
Lymphs Abs: 3.4 10*3/uL (ref 0.7–4.0)
Monocytes Relative: 6 % (ref 3–12)
Neutro Abs: 12.7 10*3/uL — ABNORMAL HIGH (ref 1.7–7.7)
Neutrophils Relative %: 73 % (ref 43–77)

## 2011-07-30 LAB — POCT I-STAT 3, ART BLOOD GAS (G3+)
Bicarbonate: 10.1 mEq/L — ABNORMAL LOW (ref 20.0–24.0)
O2 Saturation: 98 %
TCO2: 11 mmol/L (ref 0–100)
pCO2 arterial: 22.2 mmHg — ABNORMAL LOW (ref 35.0–45.0)
pH, Arterial: 7.267 — ABNORMAL LOW (ref 7.350–7.400)

## 2011-07-30 LAB — GLUCOSE, CAPILLARY: Glucose-Capillary: 103 mg/dL — ABNORMAL HIGH (ref 70–99)

## 2011-07-30 LAB — MAGNESIUM
Magnesium: 2 mg/dL (ref 1.5–2.5)
Magnesium: 2 mg/dL (ref 1.5–2.5)

## 2011-07-30 LAB — HEPATIC FUNCTION PANEL
ALT: 14 U/L (ref 0–35)
AST: 17 U/L (ref 0–37)
Albumin: 2.5 g/dL — ABNORMAL LOW (ref 3.5–5.2)
Alkaline Phosphatase: 91 U/L (ref 39–117)
Total Bilirubin: 0.3 mg/dL (ref 0.3–1.2)
Total Protein: 5.2 g/dL — ABNORMAL LOW (ref 6.0–8.3)

## 2011-07-30 LAB — ETHANOL: Alcohol, Ethyl (B): <11 mg/dL (ref 0–11)

## 2011-07-30 LAB — OSMOLALITY: Osmolality: 288 mOsm/kg (ref 275–300)

## 2011-07-30 LAB — NA AND K (SODIUM & POTASSIUM), RAND UR: Potassium Urine: 9 mEq/L

## 2011-07-30 LAB — FERRITIN: Ferritin: 179 ng/mL (ref 10–291)

## 2011-07-30 LAB — FOLATE: Folate: 10.4 ng/mL

## 2011-07-31 ENCOUNTER — Other Ambulatory Visit: Payer: Self-pay | Admitting: Internal Medicine

## 2011-07-31 ENCOUNTER — Inpatient Hospital Stay (HOSPITAL_COMMUNITY): Payer: Medicare Other

## 2011-07-31 DIAGNOSIS — E872 Acidosis: Secondary | ICD-10-CM

## 2011-07-31 DIAGNOSIS — A419 Sepsis, unspecified organism: Secondary | ICD-10-CM

## 2011-07-31 DIAGNOSIS — R4182 Altered mental status, unspecified: Secondary | ICD-10-CM

## 2011-07-31 LAB — COMPREHENSIVE METABOLIC PANEL
ALT: 14 U/L (ref 0–35)
AST: 18 U/L (ref 0–37)
Calcium: 7 mg/dL — ABNORMAL LOW (ref 8.4–10.5)
Sodium: 140 mEq/L (ref 135–145)
Total Protein: 5 g/dL — ABNORMAL LOW (ref 6.0–8.3)

## 2011-07-31 LAB — URINE DRUGS OF ABUSE SCREEN W ALC, ROUTINE (REF LAB)
Amphetamine Screen, Ur: NEGATIVE
Barbiturate Quant, Ur: NEGATIVE
Cocaine Metabolites: NEGATIVE
Creatinine,U: 45.6 mg/dL
Ethyl Alcohol: <10 mg/dL (ref <10)
Methadone: NEGATIVE

## 2011-07-31 LAB — DIFFERENTIAL
Basophils Absolute: 0 10*3/uL (ref 0.0–0.1)
Basophils Relative: 0 % (ref 0–1)
Neutro Abs: 10.8 10*3/uL — ABNORMAL HIGH (ref 1.7–7.7)
Neutrophils Relative %: 78 % — ABNORMAL HIGH (ref 43–77)

## 2011-07-31 LAB — GLUCOSE, CAPILLARY
Glucose-Capillary: 187 mg/dL — ABNORMAL HIGH (ref 70–99)
Glucose-Capillary: 126 mg/dL — ABNORMAL HIGH (ref 70–99)
Glucose-Capillary: 103 mg/dL — ABNORMAL HIGH (ref 70–99)
Glucose-Capillary: 163 mg/dL — ABNORMAL HIGH (ref 70–99)

## 2011-07-31 LAB — CHLORIDE, URINE, RANDOM: Chloride Urine: 33 mEq/L

## 2011-07-31 LAB — AMYLASE: Amylase: 141 U/L — ABNORMAL HIGH (ref 0–105)

## 2011-07-31 LAB — TSH: TSH: 1.134 u[IU]/mL (ref 0.350–4.500)

## 2011-07-31 LAB — CARDIAC PANEL(CRET KIN+CKTOT+MB+TROPI)
CK, MB: 7 ng/mL (ref 0.3–4.0)
Relative Index: 3.9 — ABNORMAL HIGH (ref 0.0–2.5)
Troponin I: <0.3 ng/mL (ref <0.30)

## 2011-07-31 LAB — TRANSFERRIN: Transferrin: 103 mg/dL — ABNORMAL LOW (ref 200–360)

## 2011-07-31 LAB — CBC
Hemoglobin: 11.5 g/dL — ABNORMAL LOW (ref 12.0–15.0)
MCHC: 35.1 g/dL (ref 30.0–36.0)
RBC: 3.78 MIL/uL — ABNORMAL LOW (ref 3.87–5.11)

## 2011-07-31 LAB — T4, FREE: Free T4: 0.86 ng/dL (ref 0.80–1.80)

## 2011-07-31 LAB — PHOSPHORUS: Phosphorus: 2.3 mg/dL (ref 2.3–4.6)

## 2011-08-01 LAB — DIFFERENTIAL
Basophils Absolute: 0.1 10*3/uL (ref 0.0–0.1)
Basophils Relative: 0 % (ref 0–1)
Eosinophils Absolute: 0.2 10*3/uL (ref 0.0–0.7)
Eosinophils Relative: 2 % (ref 0–5)
Monocytes Absolute: 0.7 10*3/uL (ref 0.1–1.0)
Monocytes Relative: 6 % (ref 3–12)
Neutro Abs: 7.3 10*3/uL (ref 1.7–7.7)

## 2011-08-01 LAB — COMPREHENSIVE METABOLIC PANEL
ALT: 17 U/L (ref 0–35)
AST: 59 U/L — ABNORMAL HIGH (ref 0–37)
Alkaline Phosphatase: 86 U/L (ref 39–117)
CO2: 16 mEq/L — ABNORMAL LOW (ref 19–32)
Calcium: 7.1 mg/dL — ABNORMAL LOW (ref 8.4–10.5)
Potassium: 5 mEq/L (ref 3.5–5.1)
Sodium: 139 mEq/L (ref 135–145)
Total Protein: 4.5 g/dL — ABNORMAL LOW (ref 6.0–8.3)

## 2011-08-01 LAB — GLUCOSE, CAPILLARY
Glucose-Capillary: 114 mg/dL — ABNORMAL HIGH (ref 70–99)
Glucose-Capillary: 124 mg/dL — ABNORMAL HIGH (ref 70–99)

## 2011-08-01 LAB — CBC
MCH: 30.2 pg (ref 26.0–34.0)
MCHC: 34 g/dL (ref 30.0–36.0)
Platelets: 187 10*3/uL (ref 150–400)
RDW: 15.6 % — ABNORMAL HIGH (ref 11.5–15.5)

## 2011-08-01 LAB — PHOSPHORUS: Phosphorus: 1.2 mg/dL — ABNORMAL LOW (ref 2.3–4.6)

## 2011-08-02 LAB — GLUCOSE, CAPILLARY: Glucose-Capillary: 137 mg/dL — ABNORMAL HIGH (ref 70–99)

## 2011-08-02 LAB — COMPREHENSIVE METABOLIC PANEL
ALT: 33 U/L (ref 0–35)
BUN: 3 mg/dL — ABNORMAL LOW (ref 6–23)
CO2: 18 mEq/L — ABNORMAL LOW (ref 19–32)
Calcium: 7.6 mg/dL — ABNORMAL LOW (ref 8.4–10.5)
Creatinine, Ser: 0.55 mg/dL (ref 0.50–1.10)
GFR calc Af Amer: >90 mL/min (ref >90)
GFR calc non Af Amer: >90 mL/min (ref >90)
Glucose, Bld: 118 mg/dL — ABNORMAL HIGH (ref 70–99)
Sodium: 137 mEq/L (ref 135–145)

## 2011-08-02 LAB — CBC
HCT: 29.4 % — ABNORMAL LOW (ref 36.0–46.0)
Hemoglobin: 10.4 g/dL — ABNORMAL LOW (ref 12.0–15.0)
MCH: 30.3 pg (ref 26.0–34.0)
MCV: 85.7 fL (ref 78.0–100.0)
RBC: 3.43 MIL/uL — ABNORMAL LOW (ref 3.87–5.11)

## 2011-08-02 LAB — DIFFERENTIAL
Lymphocytes Relative: 31 % (ref 12–46)
Lymphs Abs: 3.9 10*3/uL (ref 0.7–4.0)
Monocytes Relative: 5 % (ref 3–12)
Neutro Abs: 7.8 10*3/uL — ABNORMAL HIGH (ref 1.7–7.7)
Neutrophils Relative %: 62 % (ref 43–77)

## 2011-08-02 LAB — VANCOMYCIN, TROUGH: Vancomycin Tr: 21.9 ug/mL — ABNORMAL HIGH (ref 10.0–20.0)

## 2011-08-02 NOTE — Consult Note (Signed)
NAMEGAILENE, [name]                ACCOUNT NO.:  [id number]  MEDICAL RECORD NO.:  [id number]  LOCATION:  3303                         FACILITY:  [facility]  PHYSICIAN:  Willis Modena, MD     DATE OF BIRTH:  1961/07/29  DATE OF CONSULTATION:  07/30/2011 DATE OF DISCHARGE:                                CONSULTATION   REASON FOR CONSULTATION:  Abdominal pain, biliary dilatation, elevated lipase.  CONSULTING PHYSICIAN:  Ripudeep Rai, MD  CHIEF COMPLAINT:  Abdominal pain.  HISTORY OF PRESENT ILLNESS:  [name] is a 50 year old female with history of diabetes, Crohn's disease, and prior alcohol related pancreatitis.  The patient presented yesterday with epigastric pain, nausea, and vomiting.  The patient is quite somnolent and history is difficult to obtain.  She tells me she has been having some epigastric pain for a while associated with some nausea, vomiting.  She tells me the pain can radiate to her back.  Further history is fairly challenging.  PAST MEDICAL HISTORY/PAST SURGICAL HISTORY/HOME MEDICATIONS/ALLERGIES/FAMILY HISTORY/SOCIAL HISTORY/REVIEW OF SYSTEMS: All from dictated note from Dr. Kaylyn Layer dated July 30, 2011.  I have reviewed and I agree.  PHYSICAL EXAMINATION:  VITAL SIGNS:  Blood pressure is 134/98, heart rate 107, temperature 98.1. GENERAL:  [name] is somnolent, but arousable.  History is difficult to obtain. NEURO:  She is alert and oriented but again unable to give good history. Diffusely nonfocal without lateralizing signs. HEENT:  Dry mucous membranes.  No oropharyngeal lesions.  Eyes:  Sclerae anicteric.  Conjunctivae are pink. LUNGS:  Clear. HEART:  Tachycardic but regular. ABDOMEN:  Soft.  She does have some epigastric and right upper quadrant tenderness.  Bowel sounds normoactive.  No liver or splenic enlargement. No bulging flanks to suggest ascites. LYMPHATICS:  No palpable axillary, submandibular, supraclavicular adenopathy. SKIN:  No rash or  ecchymoses. EXTREMITIES:  No peripheral cyanosis, clubbing, edema.  LABORATORY STUDIES:  Hemoglobin is 11.9, white count 17.3, platelet count is 274,000, INR 1.02.  Sodium 140, potassium 2.5, chloride 110, bicarb 15, BUN 6, creatinine 0.7.  Liver tests show a low total protein and albumin, normal bilirubin, alk phos, AST, and ALT.  Lactic acid normal.  Lipase modestly elevated at 200.  Acetaminophen and salicylate was normal.  CT abdomen and pelvis, I personally reviewed, dated July 30, 2011 without contrast shows bile duct prominence of 17 mm, some mild periportal edema as well, nonobstructing left kidney stone, mildly distended stomach.  IMPRESSION:  [name] is a 50 year old female presenting with abdominal pain, nausea, vomiting.  History is quite challenging.  I am not convinced she has pancreatitis based on her modestly elevated lipase and nonspecific CT scan.  Her biliary dilatation has been chronic and I doubt this represents an acute obstructive source as her liver tests are normal.  PLAN: 1. Agree with supportive management with IV fluids. 2. Given her pain at this point, I will keep her n.p.o. today and     consider advancing her to clears tomorrow. 3. She had an MRI and MRCP in 2005 for abdominal pain and biliary     dilatation, as she presently has.  Unless her LFTs become abnormal  or her symptoms persist, I would not repeat another one at this     time. 4. We will obtain a right upper quadrant ultrasound to reassess her     gallbladder. 5. If symptoms progress or persist and all of the above studies are     negative, we would consider an upper GI series to rule out ulcer     disease, gastroparesis, or gastric outlet obstruction which all     seemed very unlikely.  Thanks again for allowing me to participate in [name] care.     Willis Modena, MD   ______________________________ Willis Modena, MD    WO/MEDQ  D:  07/30/2011  T:  07/31/2011  Job:   [id number]

## 2011-08-03 LAB — CBC
MCH: 30.4 pg (ref 26.0–34.0)
MCV: 86.4 fL (ref 78.0–100.0)
Platelets: 192 10*3/uL (ref 150–400)
RDW: 15.3 % (ref 11.5–15.5)

## 2011-08-03 LAB — BENZODIAZEPINE, QUANTITATIVE, URINE
Alprazolam (GC/LC/MS), ur confirm: NEGATIVE NG/ML
Flurazepam GC/MS Conf: NEGATIVE NG/ML
Lorazepam UR QT: NEGATIVE NG/ML
Nordiazepam GC/MS Conf: NEGATIVE NG/ML
Oxazepam GC/MS Conf: NEGATIVE NG/ML
Temazepam GC/MS Conf: NEGATIVE NG/ML

## 2011-08-03 LAB — COMPREHENSIVE METABOLIC PANEL
ALT: 35 U/L (ref 0–35)
AST: 98 U/L — ABNORMAL HIGH (ref 0–37)
CO2: 18 mEq/L — ABNORMAL LOW (ref 19–32)
Chloride: 112 mEq/L (ref 96–112)
Creatinine, Ser: 0.55 mg/dL (ref 0.50–1.10)
GFR calc non Af Amer: >90 mL/min (ref >90)
Sodium: 138 mEq/L (ref 135–145)
Total Bilirubin: 0.2 mg/dL — ABNORMAL LOW (ref 0.3–1.2)

## 2011-08-03 LAB — OPIATE, QUANTITATIVE, URINE: Morphine, Confirm: 640 NG/ML — ABNORMAL HIGH

## 2011-08-03 LAB — DIFFERENTIAL
Eosinophils Absolute: 0.3 10*3/uL (ref 0.0–0.7)
Eosinophils Relative: 2 % (ref 0–5)
Lymphs Abs: 2.6 10*3/uL (ref 0.7–4.0)
Monocytes Absolute: 0.5 10*3/uL (ref 0.1–1.0)
Monocytes Relative: 5 % (ref 3–12)

## 2011-08-03 NOTE — Discharge Summary (Signed)
[name], [name]                ACCOUNT NO.:  [id number]  MEDICAL RECORD NO.:  [id number]  LOCATION:  5504                         FACILITY:  [facility]  PHYSICIAN:  Marcellus Scott, MD     DATE OF BIRTH:  1961-05-22  DATE OF ADMISSION:  07/29/2011 DATE OF DISCHARGE:  08/03/2011                        DISCHARGE SUMMARY - REFERRING   PRIMARY CARE PHYSICIAN:  At [facility].  GASTROENTEROLOGIST:  [name], MD, [facility].  DISCHARGE DIAGNOSES: 1. Acute pancreatitis of unclear etiology. 2. Metabolic acidosis, anion gap and non-anion gap. Improved. 3. Hypokalemia, repleted. 4. Hypophosphatemia, repleted. 5. Diarrhea, improved. 6. Severe protein-calorie malnutrition. 7. Type 2 diabetes mellitus with peripheral neuropathy. 8. Hypotension, resolved. 9. History of gastroparesis and Crohn's proctocolitis. 10.History of alcohol abuse. 11.History of depression and anxiety. 12.History of dyslipidemia. 13.Glaucoma.  DISCHARGE MEDICATIONS:1. Nutritional supplement liquid 240 mL p.o. t.i.d. 2. Sodium bicarbonate 650 mg p.o. t.i.d. 3. Amitriptyline 50 mg p.o. at bedtime. 4. Promethazine 25 mg p.o. b.i.d. p.r.n. nausea. 5. Trazodone 50 mg p.o. at bedtime. 6. Azopt 1%, 1 drop in both eyes daily. 7. Gabapentin 300 mg p.o. t.i.d. 8. Hydrocodone/acetaminophen, 10/660 mg, 1 tablet p.o. q.6 hourly     p.r.n. pain. 9. Indomethacin SR 75 mg p.o. b.i.d. p.r.n. pain. 10.Meclizine 25 mg p.o. t.i.d. p.r.n. vertigo. 11.Methocarbamol 750 mg p.o. b.i.d. p.r.n. muscle spasms. 12.Omeprazole 20 mg p.o. b.i.d. 13.Restasis 1 drop in both eyes daily. 14.Simvastatin 40 mg p.o. daily. 15.Sulfasalazine 1000 mg p.o. b.i.d.  DISCONTINUED MEDICATIONS: 1. Amaryl. 2. Lisinopril.  PROCEDURES:  PICC line placement and removal.  CONSULTATIONS: 1. Nephrology, Dr. Zetta Bills. 2. Pulmonary Critical Care, Dr. Lavella Hammock. 3. Gastroenterology, Dr. Willis Modena.  IMAGING: 1. Ultrasound of the  abdomen, July 31, 2011.  Impression:     a.     Increased caliber of the common bile duct with mild      intrahepatic biliary dilatation.  Similar to MRCP from today.     b.     Mild atrophy and echogenic kidney suggesting chronic medical      renal disease. 2. MRCP, July 31, 2011.  Impression:  Evaluation is constrained by     the patient's inability to complete the exam.  Mild pancreatic     inflammatory changes, compatible with known pancreatitis.     Extrahepatic biliary dilatation measuring up to 13 mm.  Common bile     duct appears to taper normally at the level of the ampulla.  No     filling defect is seen.  ERCP was suggested for further evaluation. 3. CT of the abdomen without contrast, July 30, 2011, impression:     a.     Abnormal dilatation of the common bile duct, potentially      with mild periportal edema.  Distal common bile duct obstruction      is not excluded.  Ultrasound, MRCP, or ERCP was suggested.     b.     Nonobstructive left nephrolithiasis.     c.     Mildly distended stomach.     d.     Reduced size of the hypodense lesion in the dome of the  liver compared to 2008. 4. Chest x-ray, July 29, 2011:  No active cardiopulmonary disease.  LABORATORY DATA:  CBC today hemoglobin 9.6, hematocrit 27.3, white blood cell 11.2, platelets 192.  Phosphorus 4.1, magnesium 1.8.  Comprehensive metabolic panel significant for AST 98, total protein 4.7, albumin 2.2, bicarbonate 18, glucose 143.  The anion gap is 8.  Blood cultures from one of two from July 30, 2011 was no growth to date.  Lipase on August 01, 2011 was 48.  Blood cultures x2 from July 29, 2011 are no growth to date x2.  Urine drug screen is positive for opiates and benzodiazepines.  Cardiac enzymes only significant for CK 181, CK-MB 7, troponin negative.  Urine culture negative.  Free T4 0.86.  TSH 1.134. Ferritin 179, serum folate 10.4, B12 1940, iron 56.  Methanol level was negative.   Ethylene glycol negative.  Urine osmolarity 257.  Blood alcohol level on July 30, 2011 was less than 11.  Venous lactate 0.8, serum osmolarity 288.  Procalcitonin 22.3.  Peripheral smear showed target cells, Howell-Jolly bodies and Pappenheimer bodies.  Haptoglobin is 78.  Absolute reticulocyte 46, LDH 210, serum acetaminophen level less than 15.  Admitting lipase 200.  Bicarb of 11.  DIET:  Regular diet.  ACTIVITY:  Increase activity gradually and with assistance.  TODAY'S COMPLAINT:  The patient denies complaints and is eager to go home.  She has complained about pain to the nurses but not to the MD. She had one episode of loose stools this morning.  PHYSICAL EXAMINATION:  GENERAL:  The patient is in no obvious distress. VITAL SIGNS:  Temperature 98.2 degrees Fahrenheit, pulse 108 per minute, respirations 20 per minute, blood pressure 123/82 mmHg, saturating at 99% on room air.  CBGs range mostly in the 90s to low 100 with an occasional number in the low 200s. RESPIRATORY SYSTEM:  Clear.  No increased work of breathing. CARDIOVASCULAR SYSTEM:  First and second heart sounds heard and regular. No JVD or murmurs. ABDOMEN:  Nondistended, soft, and normal bowel sounds heard. CENTRAL NERVOUS SYSTEM:  The patient is awake, alert, oriented x3 with no focal neurological deficits. EXTREMITIES:  With grade 5/5 power.  HOSPITAL COURSE:  Ms.  [name] is a 50 year old African American female patient with history of type 2 diabetes mellitus with peripheral neuropathy, Crohn's disease, prior history of alcohol-induced pancreatitis and hepatitis, hypertension, gastroparesis, depression and anxiety, glaucoma who presented to the emergency room with dizziness, epigastric abdominal pain, nausea and vomiting.  She was found to have an elevated lipase and was admitted with a presumptive diagnosis of pancreatitis. 1. Acute pancreatitis.  The patient was admitted to the hospital.  The     etiology of  the pancreatitis was not entirely clear.  The patient     indicates that she quit alcohol consumption years ago.     Gastroenterology evaluated her given her abnormal common bile duct     dilatation on imaging.  Dr. Dulce Sellar opined that her biliary     dilatation has been chronic and doubt this represents an acute     obstructive source as her liver tests were normal.  With     conservative measures, she did well.  Her diet was advanced which     she has tolerated.  There is some question of this pancreatitis     still being related to alcohol abuse.  Gastroenterology recommends     outpatient followup for endoscopic ultrasound to evaluate     pancreatitis and  the common bile duct dilatation.  The patient has     been provided with contact details for the gastroenterologist's     office to call for an appointment. 2. Metabolic acidosis which had both anion gap and non-anion gap     components.  Screening for ethylene glycol and methanol and ethanol     were negative.  This could have been due to starvation plus or     minus alcohol abuse.  This improved and this dictator discussed     with Dr. Zetta Bills who recommended changing her to oral sodium     bicarbonate.  Her basic metabolic panel can be followed up as an     outpatient. 3. Hypokalemia, repleted and corrected. 4. Hypophosphatemia, corrected and repleted. 5. Altered mental status, possibly secondary to acute illness,     resolved. 6. Leukocytosis.  Possibly a stress reaction.  Sepsis workup was     negative.  Briefly, she was on broad-spectrum antibiotics which     have been discontinued and she continues to be afebrile. 7. Type 2 diabetes mellitus.  The patient's blood sugars have been     fairly controlled without any hypoglycemic agents.  We will     discontinue her oral hypoglycemics for risk of becoming     hypoglycemic.  The patient is advised to monitor her blood sugars     closely and may consider restarting medications  at lower dose if     blood sugars start trending upwards. 8. Hypotension.  On low-dose lisinopril 5 mg, the patient had blood     pressures in the 80s and 90s and hence her lisinopril was     discontinued and her blood pressures have improved. 9. Severe protein-calorie malnutrition.  Dietary supplements and     encourage p.o. intake and avoid alcohol if she is doing that. 10.Anemia, possibly of chronic disease and phlebotomies.  Recommend     outpatient followup with repeat CBCs.  DISPOSITION:  Physical Therapy and Occupational Therapy saw her. Occupational Therapy thought that she was unsteady and recommended a skilled nursing facility.  The patient, however, vehemently refuses or declines a skilled nursing facility placement and indicates that she wants to go home.  Her teenage son lives with her and she says that between an aide and him, she will be able to manage at home.  The patient is discharged home in stable condition.  FOLLOWUP RECOMMENDATIONS: 1. With Dr. Willis Modena.  The patient is to call for an     appointment. 2. With Dr. Philipp Deputy at [facility].  The patient has a     followup appointment for September 04, 2011 at 10:30 a.m.  The case manager is trying to get an earlier appointment, and the patient needs to be seen with blood tests including CBC & CMET.]  Case Manager has arranged for home health services to draw CBC & CMET on Nov 16th and the results will be sent to Dr Philipp Deputy.  TIME SPENT:  Time taken in coordinating this discharge was 60 minutes.     Marcellus Scott, MD     AH/MEDQ  D:  08/03/2011  T:  08/03/2011  Job:  [id number]  cc:   Tresa Endo L. Philipp Deputy, M.D.  Electronically Signed by Marcellus Scott MD on 08/03/2011 05:43:07 PM

## 2011-08-04 NOTE — H&P (Signed)
NAME:  [name], [name]                ACCOUNT NO.:  [id number]  MEDICAL RECORD NO.:  [id number]  LOCATION:  [facility]                         FACILITY:  [facility]  PHYSICIAN:  Carlota Raspberry, MD         DATE OF BIRTH:  November 14, 1960  DATE OF ADMISSION:  07/29/2011 DATE OF DISCHARGE:                             HISTORY & PHYSICAL   PRIMARY CARE PHYSICIAN:  Unclear, but believed to be [facility].  [name], M.D. in Gastroenterology has also previously seen this patient.  CHIEF COMPLAINT:  Dizziness, burning, epigastric pain.  HISTORY OF PRESENT ILLNESS:  This is a 50 year old female with a history of diabetes and Crohn disease, prior alcohol-induced pancreatitis and hepatitis who presents with abdominal pain and nausea and vomiting of unclear etiology and is found to have an elevated lipase and a significant metabolic acidosis, and new anemia.  The patient is an extremely poor and vague historian, and it is unclear to me at present whether this is due to her personality and her being difficult or due to her being acutely ill.  Nevertheless per discussion with the ED and per my conversation with the patient, it appears that she has been having some dizziness when she leans over and "head on fire," also with some neck pain and then burning epigastric pain.  She does state that the burning epigastric pain has been going on for few years and that the back and neck pain have also been bothering her for quite a while.  When asked what brought her to the hospital today, she stares off and closes her eyes and ignores me.  She does, however, endorse that she has been having some vomiting and some diarrhea and extremely poor p.o. intake and possibly a small amount of fever. However, as above her history is extremely difficult to obtain as she basically kind of ignores me.  In the emergency room per discussion with the ED, she may have been a bit hypotensive to the 80s and 90s per EMS, but by the  time she got here she was 108/75 with a pulse of 112, respirations 18, and a temp of 97.8. Her workup in the ED has revealed a lipase of 200, which is up from prior.  She is also hypokalemic at 2.6, and found to have a bicarb of 11.  Her white blood cell count is elevated, but she appears to have leukocytosis going back many years at least in the values we have recorded.  She is also newly anemic.  She was given Zofran, morphine, 1 g of magnesium, 10 mEq of KCl x2, and 2 L of normal saline and is currently getting her third.  On review of her chemistry panel, I requested that they get an ABG which was done and shows a pH of 7.26, pCO2 of 22, and bicarbonate of 10 with an O2 of 122.  Therefore, she is being admitted to triad hospitalist for evaluation of new anemia, pancreatitis, leukocytosis and metabolic acidosis and hypokalemia.  PAST MEDICAL HISTORY: 1. Type 2 diabetes, complicated by peripheral neuropathy. 2. History of gastroparesis. 3. 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 4. Constipation. 5. History of alcohol-induced pancreatitis and hepatitis. 6. Depression. 7. Allergic rhinitis. 8. Dyslipidemia. 9. Hypertension. 10.Glaucoma.  PAST SURGICAL HISTORY: 1. History of upper as well as lower GI endoscopies. 2. History of MRI, MRCP in 2005. 3. Caesarian section. 4. Uterine fibroid removal.  MEDICATION LIST:  Provided by the patient in a hand written piece of paper and that includes; 1. Indomethacin ER 75 mg twice a day. 2. Promethazine 25 mg q.8 h. 3. Meclizine 25 mg q.8 h. 4. Gabapentin 300 mg t.i.d. 5. Loratadine 10 mg daily. 6. Amitriptyline 50 mg HS. 7. Sulfasalazine 500 mg b.i.d. 8. Lisinopril 5 mg once a day. 9. Simvastatin 40 mg daily. 10.Omeprazole DR 20 mg twice a day. 11.Methocarbamol 750 mg 1 twice a day. 12.Alprazolam 1 mg t.i.d.  ALLERGIES:  Listed are to  PENICILLIN and IV CONTRAST.  SOCIAL HISTORY:  Really unable to be fully obtained.  She is able to state she lives at home with her son and denies that she has been drinking alcohol.  She does per prior E-chart have a history of smoking and no history of IV drug use.  FAMILY HISTORY:  Per E-chart, no history of hypertension, diabetes or Crohn disease in her family.  PHYSICAL EXAMINATION:  VITAL SIGNS:  Blood pressure is 117/85; pulse is 119; respirations 13 and 100%. GENERAL:  She is extremely thin and almost cachectic and frail appearing despite her age in the 110s.  She appears quite lethargic and is quite unpleasant to talk to and I am not sure because she is quite ill or because she is just that lethargic, but she is argumentative and does not answer my questions.  Nevertheless, her speech is clear and her thoughts are linear.  She does not appear in cardiopulmonary distress, but does appear moderately ill. HEENT:  Her pupils are equal, round, reactive to light.  Extraocular muscles are intact.  Her sclerae are clear.  Her mouth is a bit dry appearing, but not overly so.  There are no oropharyngeal lesions noted. LUNGS:  She is able to sit forward for me and her lungs are clear to auscultation bilaterally.  No wheezes, crackles, rales, or rhonchi are appreciated. HEART:  Tachycardic, but it is regular with no murmurs or gallops appreciated or other adventitious lung sounds. ABDOMEN:  Scaphoid, it is not obese, it is soft, nonperitoneal, but she does have facial grimacing when I palpate basically all of her quadrants.  She does have bowel sounds. EXTREMITIES:  Bilateral coolness of her bilateral hands.  There is no cyanosis or any clubbing, but her hands are kind of a bit cold as are her feet.  Her bilateral radial pulses are a bit difficult to palpate. She does not have any bilateral lower extremity edema and in fact her extremities appear very cachectic. NEUROLOGIC:  She is  lethargic, but answering questions appropriately, but not very directed.  She is moving her extremities spontaneously. Neuro exam is a bit limited though.  LABORATORY WORK:  White blood cell count is 15.8 with a normal differential.  She does have leukocytosis noted in E-chart going back many years during her admissions, hematocrit is 35.4 with an MCV of 87, this is lower than prior of 45.6, last noted in February 2012, platelets are 301.  INR is 0.91.  Chemistry panel; sodium is 142, potassium 2.6, chloride 104, bicarb 11, renal function is 9 and 0.91, glucose is 81. Renal function is within  her prior baseline.  LFTs are normal and her albumin is 3.2, calcium is 8.8, lactate 1.0.  Lipase is 200, it was last 13 on 10/20/2009.  Troponin 0.03.  UA shows moderate bilirubin, 15 ketones, 30 protein, small leuk esterase, negative nitrite, and 3-6 WBCs.  Urine culture and blood culture x2 are pending.  DIAGNOSTIC DATA:  Chest x-ray shows no active cardiopulmonary disease.  EKG initially done in the emergency room is almost uninterpretable and repeat one is better tracing.  It shows sinus tachycardia to 111 beats per minute with a normal axis, normal P-waves, and normal QRS duration with an early R-wave progression.  There are no Q-waves.  There are no frank ST-segment deviations, but she does have T-wave inversions in all inferior leads and anterolateral from V2 to V6.  Overall compared to prior, the T-wave inversions appear more prominent.  IMPRESSION:  This is a 50 year old female with a history of diabetes, Crohn disease, gastroparesis, prior alcohol-induced pancreatitis and hepatitis who now presents with unclear symptomatology, but possibly including vomiting and diarrhea, and epigastric pain, and is found to have severe metabolic acidosis, pancreatitis, hypokalemia, new anemia, and EKG changes. 1. Metabolic acidosis.  Her bicarbonate is 10 with an appropriate     respiratory  compensation by winters formula.  She does appear to     have a delta gap by my calculations with an anion gap of 27 and a     bicarbonate of 13, which suggests the possible concurrent metabolic     alkalosis.  Regarding the etiology, she is not in diabetic     ketoacidosis.  Her lactic acid is normal and she is not in renal     failure.  I have spoken with Critical Care who have agreed to see     the patient and help think this acidosis through.  Initial thoughts     are that she may be producing a D-lactic acidosis from some     bacterial translocation in her gut, or having an RTA, or have     severe bicarbonate loss from diarrhea. 2. Therefore, overnight we will treat her with broad-spectrum     antibiotics, trend her lactate, possibly try to get a D-lactic acid     if we can, get a procalcitonin and continue IV fluids.  We will get     a serum os and Tylenol and salsalate levels.  We will get cardiac     enzymes although I highly doubt she is in cardiogenic shock.  I     would also consider renal consult to help see if she is on RTA.  To     this effort, we will get urine sodium, potassium, creatinine and     urine osmolality.  We will also get urine toxicology.  Finally, we     will get CT of her abdomen and pelvis with no contrast given her     allergy to this to see if she has any active abdominal process,     which would be causing a septic type picture. 3. Pancreatitis.  We will keep her n.p.o., give her IV fluids and do     pain and nausea control.  We will scan her abdomen as above.  She     denies being a drinker and I am not quite sure the etiology of     this.  We originally considered a right upper quadrant ultrasound,     but this is not available  at this hour. 4. Hypokalemia.  We will continue to aggressively replete her and will     get a magnesium level as well. 5. Anemia, this happens to be a new finding through this year.  We     will get iron studies, folate, B12,  retic count, LDH, haptoglobin     and peripheral smear.  We will Hemoccult her stools. 6. Leukocytosis.  She appears to have elevated white blood cell     counts, going back for numerous years, however, does have values     that have normalized.  At this point, we will get a peripheral     smear and follow up blood cultures and urine cultures.  We will     cover her empirically for now given her acute illness.  Her chest x-     ray is clear and her UA does not show signs of infection. 7. EKG changes.  She has diffuse inferior and anterolateral T-wave     changes, but her troponin is negative x1.  I suspect this is likely     due to her profound metabolic abnormalities and we will address the     underlying causes for now and trend her EKGs. 8. Fluid, electrolytes and nutrition.  We will keep her n.p.o. for now     and give her continuous 125 mL/h of normal saline with 20 mEq's of     potassium chloride. 9. Prophylaxis, subcutaneous heparin unless she is ambulatory t.i.d.,     Zofran, Colace, senna, Tylenol and oxycodone for pain. 10.IV access.  This has been an issue in the emergency room and she at     present has a small bore peripheral IV that was difficult to     obtain.  She has also been noted to be a difficult venous stick.     Therefore, I have ordered for her to get a PICC in the morning as I     expect she will be in the hospital for a while. 11.Code status.  This was not even able to be addressed.  I will     presume full at this point. 12.The patient will be admitted to the step-down unit under triad team     7.          ______________________________ Carlota Raspberry, MD     EB/MEDQ  D:  07/30/2011  T:  07/30/2011  Job:  [id number]  Electronically Signed by Carlota Raspberry MD on 08/04/2011 04:18:45 AM

## 2011-08-05 LAB — CULTURE, BLOOD (ROUTINE X 2)
Culture  Setup Time: 201210290254
Culture: NO GROWTH

## 2011-08-06 LAB — CULTURE, BLOOD (SINGLE)
Culture  Setup Time: 201210300227
Culture: NO GROWTH

## 2011-08-13 NOTE — Consult Note (Signed)
NAMESRUTI, [name]                ACCOUNT NO.:  [id number]  MEDICAL RECORD NO.:  [id number]  LOCATION:  3303                         FACILITY:  [facility]  PHYSICIAN:  Zetta Bills, MD          DATE OF BIRTH:  [birth year]  DATE OF CONSULTATION:  07/30/2011 DATE OF DISCHARGE:                                CONSULTATION   Nephrology is consulted by Dr. Thad Ranger of the triad hospitalist service for the evaluation and management of a profound metabolic acidosis with hypokalemia.  HISTORY OF PRESENTING ILLNESS:  [name] is a 50 year old, African American woman with past medical history significant for diabetes for the last 7 or so years, complicated by peripheral neuropathy, no retinopathy, and Crohn'[name] disease.  She also has a prior history of alcohol-induced pancreatitis and hepatitis.  She presented to the emergency room yesterday with dizziness and burning epigastric pain with an elevated lipase of 200.  She was deemed to clinically be in pancreatitis and started on an NPO status with intravenous fluids.  Of significant note, was severe hypokalemia of 2.6, as well as metabolic acidosis with a pH of 7.26 and a bicarbonate level of 11. [name] denies any preceding nausea vomiting or diarrhea, however, obtaining history from her is difficult due to waning and waxing mental status.  She denies any over-the-counter medicine use except for Tylenol that she takes about 4 g a day.  Denies any unusual aspirin use or any traditional/herbal medications.  She denies any alcohol use and reports that she is still an active smoker.  Denies any illicit drug abuse, including inhalational abuse.  She denies any history of SLE or Sjogren syndrome.  Denies history of nephrolithiasis and does not have a history of kidney failure.  She reports that it has been about 48 hours since she had a decent meal and basically been trying to stay on water/clear liquid.  Several labs have been drawn since her  arrival here, and this include a urinalysis with a pH of 6.0, trace ketones.  Serum ketones are pending. Serum osmolality of 288.  Salicylate level less than 2.0.  Tylenol level undetectable (less than 15) and lactic acid level within normal limits of 1.0.  PAST MEDICAL HISTORY: 1. Hypertension. 2. Type 2 diabetes mellitus for the last 7-8 years. 3. History of gastroparesis .  Crohn proctocolitis, diagnosed by     colonoscopy in 2004, and clinical followup in 2008 indicates     inactive disease. 4. History of alcohol associated pancreatitis/hepatitis. 5. Depression/anxiety. 6. Dyslipidemia. 7. Glaucoma. 8. Allergic rhinitis.  MEDICATIONS:  Prior to admission to the hospital: 1. Indomethacin ER 75 mg p.o. b.i.d. 2. Meclizine 25 mg p.o. t.i.d. 3. Gabapentin 300 mg p.o. t.i.d. 4. Amitriptyline 50 mg at bedtime. 5. Sulfasalazine 500 mg p.o. b.i.d. 6. Lisinopril 5 mg p.o. daily. 7. Simvastatin 40 mg daily. 8. Omeprazole 20 mg b.i.d. 9. Robaxin 750 mg b.i.d. 10.Xanax 1 mg t.i.d. 11.Promethazine 25 mg q.8 hours p.r.n. 12.Loratadine 10 mg daily. ALLERGIES:  PENICILLINS gives a skin rash to IV contrast unknown reaction.  SOCIAL HISTORY:  Unable to clearly obtain due to the patient'[name] waxing and waning mental  status.  History states that she lives at home with her son and denies any recent alcohol use.  Ongoing smoker.  FAMILY HISTORY:  Denies any history of lupus, kidney disease, or any family members with early death.  REVIEW OF SYSTEMS:  Attempted, however, very difficult to obtain due to the patient'[name] waxing and waning mental status.  She responds to close questioning, however, not to open ended questions.  PHYSICAL EXAM:  VITAL SIGNS:  Temperature 97.7 degrees Fahrenheit, pulse ranging 104-116 , blood pressure 122 to 134 over 91 to 98, respiratory rate of 17, oxygen saturation 98% on room air. GENERAL EVALUATION:  Thin, near cachectic frail appearing middle-aged woman.  She  appears much older than her stated age. HEAD, NECK, and ENT SYSTEMS:  The head is normocephalic and atraumatic. Bitemporal wasting is noted.  Pupils are equal and reactive to light. Extraocular muscle movements are normal.  Funduscopy not done. NECK:  Supple without any obvious JVD or goiter.  No lymphadenopathy. No bruit.  No goiter is palpable. CARDIOVASCULAR SYSTEM:  Pulse is regular, tachycardic.  Heart sounds S1 and S2 are normal without any obvious murmurs, rubs, or gallops. RESPIRATORY EVALUATION:  Both lung fields are clear to auscultation.  No rales, retractions, or rhonchi. ABDOMEN:  Soft, obese, tender over the epigastric as well as left upper quadrant areas. EXTREMITIES:  No edema is palpable over either lower extremity. NEUROLOGICAL:  Nonfocal exam.  She is oriented to time, person, and place when she stays long enough to answer questions.  LABS:  Sodium 140, potassium 2.5 bicarbonate 15, BUN 6, creatinine 0.7, glucose 115, calcium 7.4, magnesium 2.0, phosphorus 1.9, albumin 2.5, AST 16, ALT 14, LDH 210, total bilirubin 0.3, alkaline phosphatase 91. Hemoglobin 11.9, hematocrit 33.2, white cell count 17,300, platelet count 274,000.  Tylenol level less than 15.  Salicylate less than 2. Serum osmolality 288.  RADIOLOGICAL IMAGING:  CT scan of the abdomen was done without any contrast, reveals abnormal dilatation of the CBD with potential mild periportal edema.  Distal CBD obstruction could not be ruled out.  She has discrepant renal size to the left kidney measuring 10.4 cm and the right kidney measuring 8.9 cm without any hydronephrosis or proximal hydroureter.  No pathological retroperitoneal adenopathy was seen and a 2-mm left nonobstructive mid renal calculus was noted.  ASSESSMENT AND PLAN: 1. Metabolic acidosis.  This has both a anion gap and a non-anion gap     component.  She is currently ongoing comprehensive evaluation of an     anion gap metabolic acidosis and I  agree with checking for     pyroglutamic acidosis (Oxoproline) as it is high on the     differential list with her relatively malnourished status as well     as ongoing Tylenol use.  Her lactic acid level, salicylate level     are so far negative.  I also agree with screening for ethylene     glycol, ethanol, and methanol although the latter are less likely     given the overall clinical picture.  Serum ketone levels are     pending and possibly attributable to starvation plus or minus     ethanol.  Unifying diagnosis may be organic inhalant abuse which     she very adamantly denies at this time.  No recent exposure noted     to toxic fumes accidentally either.  With her non-anion gap     component, it appears that she has a proximal renal  tubular     acidosis given hypophosphatemia, hypokalemia, and hypoalbuminemia.     This might be a presentation of an coronary syndrome from ongoing     sulfasalazine use.  I will check a urine anion gap to corroborate     with this.  I agree with intravenous fluids at this time in the     form of normal saline as we await labs.  Agree with discontinuation     of NSAIDs. 2. Pancreatitis.  Appears idiopathic at this time, although previously     secondary to ethanol.  She is currently NPO and undergoing    intravenous fluid resuscitation.  Await GI evaluation of this     abnormal CBD dilatation that was noted.  No pancreatic cyst or     lesion was noted on CT scan of the abdomen and pelvis.  She is     currently on ciprofloxacin, Flagyl, and vancomycin. 3. Hypokalemia.  This is in spite of ACE inhibitor use.  She denies     any gastrointestinal losses and we will check a urine potassium and     creatinine ratio.  This will be to assess the renal response to     hypokalemia and to ensure that she does not have a renal wasting     syndrome.     Zetta Bills, MD     JP/MEDQ  D:  07/30/2011  T:  07/30/2011  Job:  [id number]  cc:   [facility]  Electronically Signed by Zetta Bills MD on 08/13/2011 02:57:01 PM

## 2011-11-20 DIAGNOSIS — H4011X0 Primary open-angle glaucoma, stage unspecified: Secondary | ICD-10-CM | POA: Diagnosis not present

## 2011-11-20 DIAGNOSIS — H2510 Age-related nuclear cataract, unspecified eye: Secondary | ICD-10-CM | POA: Diagnosis not present

## 2011-11-20 DIAGNOSIS — H409 Unspecified glaucoma: Secondary | ICD-10-CM | POA: Diagnosis not present

## 2011-11-20 DIAGNOSIS — H02409 Unspecified ptosis of unspecified eyelid: Secondary | ICD-10-CM | POA: Diagnosis not present

## 2011-12-31 DIAGNOSIS — H409 Unspecified glaucoma: Secondary | ICD-10-CM | POA: Diagnosis not present

## 2011-12-31 DIAGNOSIS — H4011X0 Primary open-angle glaucoma, stage unspecified: Secondary | ICD-10-CM | POA: Diagnosis not present

## 2012-01-03 DIAGNOSIS — M1710 Unilateral primary osteoarthritis, unspecified knee: Secondary | ICD-10-CM | POA: Diagnosis not present

## 2012-01-03 DIAGNOSIS — M25559 Pain in unspecified hip: Secondary | ICD-10-CM | POA: Diagnosis not present

## 2012-01-29 DIAGNOSIS — H02409 Unspecified ptosis of unspecified eyelid: Secondary | ICD-10-CM | POA: Diagnosis not present

## 2012-01-29 DIAGNOSIS — H4011X0 Primary open-angle glaucoma, stage unspecified: Secondary | ICD-10-CM | POA: Diagnosis not present

## 2012-01-29 DIAGNOSIS — H2510 Age-related nuclear cataract, unspecified eye: Secondary | ICD-10-CM | POA: Diagnosis not present

## 2012-01-29 DIAGNOSIS — H409 Unspecified glaucoma: Secondary | ICD-10-CM | POA: Diagnosis not present

## 2012-03-05 DIAGNOSIS — R69 Illness, unspecified: Secondary | ICD-10-CM | POA: Diagnosis not present

## 2012-03-05 DIAGNOSIS — E119 Type 2 diabetes mellitus without complications: Secondary | ICD-10-CM | POA: Diagnosis not present

## 2012-03-05 DIAGNOSIS — F411 Generalized anxiety disorder: Secondary | ICD-10-CM | POA: Diagnosis not present

## 2012-03-05 DIAGNOSIS — E538 Deficiency of other specified B group vitamins: Secondary | ICD-10-CM | POA: Diagnosis not present

## 2012-05-29 DIAGNOSIS — M1710 Unilateral primary osteoarthritis, unspecified knee: Secondary | ICD-10-CM | POA: Diagnosis not present

## 2012-05-29 DIAGNOSIS — M25559 Pain in unspecified hip: Secondary | ICD-10-CM | POA: Diagnosis not present

## 2012-07-17 DIAGNOSIS — M1710 Unilateral primary osteoarthritis, unspecified knee: Secondary | ICD-10-CM | POA: Diagnosis not present

## 2012-08-08 DIAGNOSIS — M545 Low back pain: Secondary | ICD-10-CM | POA: Diagnosis not present

## 2012-08-08 DIAGNOSIS — R52 Pain, unspecified: Secondary | ICD-10-CM | POA: Diagnosis not present

## 2012-10-11 ENCOUNTER — Inpatient Hospital Stay (HOSPITAL_COMMUNITY)
Admission: EM | Admit: 2012-10-11 | Discharge: 2012-10-24 | DRG: 870 | Disposition: A | Discharge status: Skilled Nursing Facility | Payer: Medicare Other | Attending: Internal Medicine | Admitting: Internal Medicine

## 2012-10-11 ENCOUNTER — Encounter (HOSPITAL_COMMUNITY): Payer: Self-pay | Admitting: Emergency Medicine

## 2012-10-11 ENCOUNTER — Emergency Department (HOSPITAL_COMMUNITY): Payer: Medicare Other

## 2012-10-11 DIAGNOSIS — H409 Unspecified glaucoma: Secondary | ICD-10-CM | POA: Diagnosis present

## 2012-10-11 DIAGNOSIS — E876 Hypokalemia: Secondary | ICD-10-CM

## 2012-10-11 DIAGNOSIS — R6520 Severe sepsis without septic shock: Secondary | ICD-10-CM | POA: Diagnosis not present

## 2012-10-11 DIAGNOSIS — E119 Type 2 diabetes mellitus without complications: Secondary | ICD-10-CM | POA: Diagnosis not present

## 2012-10-11 DIAGNOSIS — G609 Hereditary and idiopathic neuropathy, unspecified: Secondary | ICD-10-CM

## 2012-10-11 DIAGNOSIS — D72829 Elevated white blood cell count, unspecified: Secondary | ICD-10-CM | POA: Diagnosis present

## 2012-10-11 DIAGNOSIS — D509 Iron deficiency anemia, unspecified: Secondary | ICD-10-CM | POA: Diagnosis present

## 2012-10-11 DIAGNOSIS — E872 Acidosis, unspecified: Secondary | ICD-10-CM

## 2012-10-11 DIAGNOSIS — D649 Anemia, unspecified: Secondary | ICD-10-CM | POA: Diagnosis not present

## 2012-10-11 DIAGNOSIS — Z880 Allergy status to penicillin: Secondary | ICD-10-CM | POA: Diagnosis not present

## 2012-10-11 DIAGNOSIS — J9600 Acute respiratory failure, unspecified whether with hypoxia or hypercapnia: Secondary | ICD-10-CM

## 2012-10-11 DIAGNOSIS — I10 Essential (primary) hypertension: Secondary | ICD-10-CM | POA: Diagnosis present

## 2012-10-11 DIAGNOSIS — J101 Influenza due to other identified influenza virus with other respiratory manifestations: Secondary | ICD-10-CM | POA: Diagnosis not present

## 2012-10-11 DIAGNOSIS — E8809 Other disorders of plasma-protein metabolism, not elsewhere classified: Secondary | ICD-10-CM | POA: Diagnosis present

## 2012-10-11 DIAGNOSIS — R791 Abnormal coagulation profile: Secondary | ICD-10-CM

## 2012-10-11 DIAGNOSIS — J9690 Respiratory failure, unspecified, unspecified whether with hypoxia or hypercapnia: Secondary | ICD-10-CM

## 2012-10-11 DIAGNOSIS — F329 Major depressive disorder, single episode, unspecified: Secondary | ICD-10-CM | POA: Diagnosis present

## 2012-10-11 DIAGNOSIS — K861 Other chronic pancreatitis: Secondary | ICD-10-CM | POA: Diagnosis present

## 2012-10-11 DIAGNOSIS — F411 Generalized anxiety disorder: Secondary | ICD-10-CM | POA: Diagnosis not present

## 2012-10-11 DIAGNOSIS — J80 Acute respiratory distress syndrome: Secondary | ICD-10-CM

## 2012-10-11 DIAGNOSIS — R1031 Right lower quadrant pain: Secondary | ICD-10-CM

## 2012-10-11 DIAGNOSIS — E870 Hyperosmolality and hypernatremia: Secondary | ICD-10-CM

## 2012-10-11 DIAGNOSIS — R5381 Other malaise: Secondary | ICD-10-CM | POA: Diagnosis not present

## 2012-10-11 DIAGNOSIS — J309 Allergic rhinitis, unspecified: Secondary | ICD-10-CM

## 2012-10-11 DIAGNOSIS — G9340 Encephalopathy, unspecified: Secondary | ICD-10-CM

## 2012-10-11 DIAGNOSIS — J1100 Influenza due to unidentified influenza virus with unspecified type of pneumonia: Secondary | ICD-10-CM | POA: Diagnosis not present

## 2012-10-11 DIAGNOSIS — J1000 Influenza due to other identified influenza virus with unspecified type of pneumonia: Secondary | ICD-10-CM | POA: Diagnosis present

## 2012-10-11 DIAGNOSIS — J45909 Unspecified asthma, uncomplicated: Secondary | ICD-10-CM

## 2012-10-11 DIAGNOSIS — Z9911 Dependence on respirator [ventilator] status: Secondary | ICD-10-CM | POA: Diagnosis not present

## 2012-10-11 DIAGNOSIS — E1149 Type 2 diabetes mellitus with other diabetic neurological complication: Secondary | ICD-10-CM | POA: Diagnosis present

## 2012-10-11 DIAGNOSIS — R197 Diarrhea, unspecified: Secondary | ICD-10-CM | POA: Diagnosis not present

## 2012-10-11 DIAGNOSIS — R110 Nausea: Secondary | ICD-10-CM | POA: Diagnosis not present

## 2012-10-11 DIAGNOSIS — B1920 Unspecified viral hepatitis C without hepatic coma: Secondary | ICD-10-CM | POA: Diagnosis present

## 2012-10-11 DIAGNOSIS — Z452 Encounter for adjustment and management of vascular access device: Secondary | ICD-10-CM | POA: Diagnosis not present

## 2012-10-11 DIAGNOSIS — K7460 Unspecified cirrhosis of liver: Secondary | ICD-10-CM | POA: Diagnosis not present

## 2012-10-11 DIAGNOSIS — G9341 Metabolic encephalopathy: Secondary | ICD-10-CM | POA: Diagnosis present

## 2012-10-11 DIAGNOSIS — R079 Chest pain, unspecified: Secondary | ICD-10-CM | POA: Diagnosis not present

## 2012-10-11 DIAGNOSIS — M6281 Muscle weakness (generalized): Secondary | ICD-10-CM | POA: Diagnosis not present

## 2012-10-11 DIAGNOSIS — Z87891 Personal history of nicotine dependence: Secondary | ICD-10-CM | POA: Diagnosis not present

## 2012-10-11 DIAGNOSIS — J189 Pneumonia, unspecified organism: Secondary | ICD-10-CM

## 2012-10-11 DIAGNOSIS — Z8719 Personal history of other diseases of the digestive system: Secondary | ICD-10-CM

## 2012-10-11 DIAGNOSIS — F3289 Other specified depressive episodes: Secondary | ICD-10-CM | POA: Diagnosis present

## 2012-10-11 DIAGNOSIS — E1142 Type 2 diabetes mellitus with diabetic polyneuropathy: Secondary | ICD-10-CM | POA: Diagnosis present

## 2012-10-11 DIAGNOSIS — R404 Transient alteration of awareness: Secondary | ICD-10-CM | POA: Diagnosis not present

## 2012-10-11 DIAGNOSIS — A4159 Other Gram-negative sepsis: Secondary | ICD-10-CM | POA: Diagnosis not present

## 2012-10-11 DIAGNOSIS — J129 Viral pneumonia, unspecified: Secondary | ICD-10-CM | POA: Diagnosis not present

## 2012-10-11 DIAGNOSIS — N390 Urinary tract infection, site not specified: Secondary | ICD-10-CM | POA: Diagnosis present

## 2012-10-11 DIAGNOSIS — E874 Mixed disorder of acid-base balance: Secondary | ICD-10-CM | POA: Diagnosis not present

## 2012-10-11 DIAGNOSIS — A419 Sepsis, unspecified organism: Secondary | ICD-10-CM | POA: Diagnosis not present

## 2012-10-11 DIAGNOSIS — G894 Chronic pain syndrome: Secondary | ICD-10-CM | POA: Diagnosis present

## 2012-10-11 DIAGNOSIS — R141 Gas pain: Secondary | ICD-10-CM | POA: Diagnosis not present

## 2012-10-11 DIAGNOSIS — D696 Thrombocytopenia, unspecified: Secondary | ICD-10-CM

## 2012-10-11 DIAGNOSIS — M25559 Pain in unspecified hip: Secondary | ICD-10-CM | POA: Diagnosis not present

## 2012-10-11 DIAGNOSIS — J9589 Other postprocedural complications and disorders of respiratory system, not elsewhere classified: Secondary | ICD-10-CM | POA: Diagnosis not present

## 2012-10-11 DIAGNOSIS — J984 Other disorders of lung: Secondary | ICD-10-CM | POA: Diagnosis not present

## 2012-10-11 DIAGNOSIS — R4182 Altered mental status, unspecified: Secondary | ICD-10-CM | POA: Diagnosis not present

## 2012-10-11 DIAGNOSIS — R0602 Shortness of breath: Secondary | ICD-10-CM | POA: Diagnosis not present

## 2012-10-11 DIAGNOSIS — N179 Acute kidney failure, unspecified: Secondary | ICD-10-CM | POA: Diagnosis not present

## 2012-10-11 DIAGNOSIS — R1032 Left lower quadrant pain: Secondary | ICD-10-CM | POA: Diagnosis present

## 2012-10-11 DIAGNOSIS — R489 Unspecified symbolic dysfunctions: Secondary | ICD-10-CM | POA: Diagnosis not present

## 2012-10-11 DIAGNOSIS — E118 Type 2 diabetes mellitus with unspecified complications: Secondary | ICD-10-CM | POA: Diagnosis present

## 2012-10-11 DIAGNOSIS — K5090 Crohn's disease, unspecified, without complications: Secondary | ICD-10-CM

## 2012-10-11 DIAGNOSIS — Z79899 Other long term (current) drug therapy: Secondary | ICD-10-CM

## 2012-10-11 DIAGNOSIS — J9819 Other pulmonary collapse: Secondary | ICD-10-CM | POA: Diagnosis not present

## 2012-10-11 DIAGNOSIS — R279 Unspecified lack of coordination: Secondary | ICD-10-CM | POA: Diagnosis not present

## 2012-10-11 DIAGNOSIS — R142 Eructation: Secondary | ICD-10-CM | POA: Diagnosis not present

## 2012-10-11 DIAGNOSIS — Z4682 Encounter for fitting and adjustment of non-vascular catheter: Secondary | ICD-10-CM | POA: Diagnosis not present

## 2012-10-11 DIAGNOSIS — E1169 Type 2 diabetes mellitus with other specified complication: Secondary | ICD-10-CM | POA: Diagnosis present

## 2012-10-11 DIAGNOSIS — R262 Difficulty in walking, not elsewhere classified: Secondary | ICD-10-CM | POA: Diagnosis not present

## 2012-10-11 HISTORY — DX: Polyneuropathy, unspecified: G62.9

## 2012-10-11 HISTORY — DX: Essential (primary) hypertension: I10

## 2012-10-11 HISTORY — DX: Type 2 diabetes mellitus without complications: E11.9

## 2012-10-11 HISTORY — DX: Acute pancreatitis without necrosis or infection, unspecified: K85.90

## 2012-10-11 HISTORY — DX: Unspecified glaucoma: H40.9

## 2012-10-11 LAB — CBC
HCT: 39.7 % (ref 36.0–46.0)
Hemoglobin: 13.8 g/dL (ref 12.0–15.0)
MCH: 29.9 pg (ref 26.0–34.0)
MCHC: 34.8 g/dL (ref 30.0–36.0)
MCV: 86.1 fL (ref 78.0–100.0)
Platelets: 312 10*3/uL (ref 150–400)
RBC: 4.61 MIL/uL (ref 3.87–5.11)
RDW: 14.7 % (ref 11.5–15.5)
WBC: 16.7 10*3/uL — ABNORMAL HIGH (ref 4.0–10.5)

## 2012-10-11 LAB — RAPID URINE DRUG SCREEN, HOSP PERFORMED
Amphetamines: NOT DETECTED
Barbiturates: NOT DETECTED
Benzodiazepines: NOT DETECTED
Cocaine: NOT DETECTED
Opiates: POSITIVE — AB
Tetrahydrocannabinol: NOT DETECTED

## 2012-10-11 LAB — COMPREHENSIVE METABOLIC PANEL
ALT: 35 U/L (ref 0–35)
AST: 76 U/L — ABNORMAL HIGH (ref 0–37)
Albumin: 2.8 g/dL — ABNORMAL LOW (ref 3.5–5.2)
Alkaline Phosphatase: 167 U/L — ABNORMAL HIGH (ref 39–117)
BUN: 27 mg/dL — ABNORMAL HIGH (ref 6–23)
CO2: 10 mEq/L — CL (ref 19–32)
Calcium: 8.3 mg/dL — ABNORMAL LOW (ref 8.4–10.5)
Chloride: 104 mEq/L (ref 96–112)
Creatinine, Ser: 2.16 mg/dL — ABNORMAL HIGH (ref 0.50–1.10)
GFR calc Af Amer: 29 mL/min — ABNORMAL LOW (ref >90)
GFR calc non Af Amer: 25 mL/min — ABNORMAL LOW (ref >90)
Glucose, Bld: 61 mg/dL — ABNORMAL LOW (ref 70–99)
Potassium: 3 mEq/L — ABNORMAL LOW (ref 3.5–5.1)
Sodium: 136 mEq/L (ref 135–145)
Total Bilirubin: 0.2 mg/dL — ABNORMAL LOW (ref 0.3–1.2)
Total Protein: 7.2 g/dL (ref 6.0–8.3)

## 2012-10-11 LAB — URINALYSIS, ROUTINE W REFLEX MICROSCOPIC
Glucose, UA: NEGATIVE mg/dL
Ketones, ur: NEGATIVE mg/dL
Nitrite: NEGATIVE
Protein, ur: 30 mg/dL — AB
Specific Gravity, Urine: 1.022 (ref 1.005–1.030)
Urobilinogen, UA: 0.2 mg/dL (ref 0.0–1.0)
pH: 5 (ref 5.0–8.0)

## 2012-10-11 LAB — URINE MICROSCOPIC-ADD ON

## 2012-10-11 LAB — ETHANOL: Alcohol, Ethyl (B): <11 mg/dL (ref 0–11)

## 2012-10-11 LAB — LACTIC ACID, PLASMA: Lactic Acid, Venous: 1.5 mmol/L (ref 0.5–2.2)

## 2012-10-11 LAB — LIPASE, BLOOD: Lipase: 11 U/L (ref 11–59)

## 2012-10-11 MED ORDER — ALBUTEROL SULFATE (5 MG/ML) 0.5% IN NEBU: 2.5000 mg | INHALATION_SOLUTION | RESPIRATORY_TRACT | Status: AC | PRN

## 2012-10-11 MED ORDER — OSELTAMIVIR PHOSPHATE 75 MG PO CAPS
75.0000 mg | ORAL_CAPSULE | Freq: Two times a day (BID) | ORAL | Status: AC
  Administered 2012-10-12 – 2012-10-16 (×10): 75 mg via ORAL
  Filled 2012-10-11 (×10): qty 1

## 2012-10-11 MED ORDER — SODIUM CHLORIDE 0.9 % IV SOLN
INTRAVENOUS | Status: AC
  Administered 2012-10-12: via INTRAVENOUS

## 2012-10-11 MED ORDER — ACETAMINOPHEN 500 MG PO TABS
1000.0000 mg | ORAL_TABLET | Freq: Once | ORAL | Status: AC
  Administered 2012-10-11: 1000 mg via ORAL
  Filled 2012-10-11: qty 2

## 2012-10-11 MED ORDER — HYDROMORPHONE HCL PF 1 MG/ML IJ SOLN
1.0000 mg | Freq: Once | INTRAMUSCULAR | Status: AC
  Administered 2012-10-11: 1 mg via INTRAVENOUS
  Filled 2012-10-11: qty 1

## 2012-10-11 MED ORDER — ONDANSETRON HCL 4 MG/2ML IJ SOLN
4.0000 mg | Freq: Once | INTRAMUSCULAR | Status: AC
  Administered 2012-10-11: 4 mg via INTRAVENOUS
  Filled 2012-10-11: qty 2

## 2012-10-11 MED ORDER — DEXTROSE 5 % IV SOLN
1.0000 g | Freq: Three times a day (TID) | INTRAVENOUS | Status: DC
  Administered 2012-10-11 – 2012-10-13 (×5): 1 g via INTRAVENOUS
  Filled 2012-10-11 (×7): qty 1

## 2012-10-11 MED ORDER — ONDANSETRON HCL 4 MG/2ML IJ SOLN: 4.0000 mg | Freq: Four times a day (QID) | INTRAMUSCULAR | Status: DC | PRN

## 2012-10-11 MED ORDER — ENOXAPARIN SODIUM 40 MG/0.4ML ~~LOC~~ SOLN
40.0000 mg | Freq: Every day | SUBCUTANEOUS | Status: DC
  Filled 2012-10-11 (×2): qty 0.4

## 2012-10-11 MED ORDER — HYDROMORPHONE HCL PF 1 MG/ML IJ SOLN
1.0000 mg | INTRAMUSCULAR | Status: DC | PRN
  Administered 2012-10-12 (×4): 1 mg via INTRAVENOUS
  Filled 2012-10-11 (×4): qty 1

## 2012-10-11 MED ORDER — SODIUM CHLORIDE 0.9 % IV BOLUS (SEPSIS)
1000.0000 mL | Freq: Once | INTRAVENOUS | Status: AC
  Administered 2012-10-11: 1000 mL via INTRAVENOUS

## 2012-10-11 MED ORDER — LEVOFLOXACIN IN D5W 750 MG/150ML IV SOLN
750.0000 mg | Freq: Once | INTRAVENOUS | Status: AC
  Administered 2012-10-11: 750 mg via INTRAVENOUS
  Filled 2012-10-11: qty 150

## 2012-10-11 MED ORDER — MORPHINE SULFATE 4 MG/ML IJ SOLN: 4.0000 mg | Freq: Once | INTRAMUSCULAR | Status: DC

## 2012-10-11 MED ORDER — SULFASALAZINE 500 MG PO TABS
1000.0000 mg | ORAL_TABLET | Freq: Two times a day (BID) | ORAL | Status: DC
  Administered 2012-10-12 – 2012-10-18 (×12): 1000 mg via ORAL
  Filled 2012-10-11 (×15): qty 2

## 2012-10-11 MED ORDER — VANCOMYCIN HCL IN DEXTROSE 1-5 GM/200ML-% IV SOLN
1000.0000 mg | Freq: Once | INTRAVENOUS | Status: AC
  Administered 2012-10-11: 1000 mg via INTRAVENOUS
  Filled 2012-10-11: qty 200

## 2012-10-11 MED ORDER — ALPRAZOLAM 1 MG PO TABS
1.0000 mg | ORAL_TABLET | Freq: Three times a day (TID) | ORAL | Status: DC
  Administered 2012-10-12 – 2012-10-13 (×6): 1 mg via ORAL
  Filled 2012-10-11 (×7): qty 1

## 2012-10-11 MED ORDER — SODIUM CHLORIDE 0.9 % IV SOLN
INTRAVENOUS | Status: DC
  Administered 2012-10-12 – 2012-10-16 (×4): via INTRAVENOUS

## 2012-10-11 MED ORDER — DEXTROSE 5 % IV SOLN
500.0000 mg | INTRAVENOUS | Status: DC
  Administered 2012-10-12 – 2012-10-17 (×7): 500 mg via INTRAVENOUS
  Filled 2012-10-11 (×8): qty 500

## 2012-10-11 MED ORDER — SODIUM CHLORIDE 0.9 % IV BOLUS (SEPSIS)
2000.0000 mL | Freq: Once | INTRAVENOUS | Status: AC
  Administered 2012-10-11: 2000 mL via INTRAVENOUS

## 2012-10-11 MED ORDER — ONDANSETRON HCL 4 MG/2ML IJ SOLN: 4.0000 mg | Freq: Three times a day (TID) | INTRAMUSCULAR | Status: AC | PRN

## 2012-10-11 MED ORDER — BRINZOLAMIDE 1 % OP SUSP
1.0000 [drp] | Freq: Every day | OPHTHALMIC | Status: DC
  Administered 2012-10-12 – 2012-10-24 (×13): 1 [drp] via OPHTHALMIC
  Filled 2012-10-11 (×2): qty 10

## 2012-10-11 MED ORDER — CYCLOSPORINE 0.05 % OP EMUL
1.0000 [drp] | Freq: Two times a day (BID) | OPHTHALMIC | Status: DC
  Administered 2012-10-12 – 2012-10-24 (×23): 1 [drp] via OPHTHALMIC
  Filled 2012-10-11 (×27): qty 1

## 2012-10-11 MED ORDER — POTASSIUM CHLORIDE CRYS ER 20 MEQ PO TBCR
60.0000 meq | EXTENDED_RELEASE_TABLET | Freq: Once | ORAL | Status: AC
  Administered 2012-10-11: 60 meq via ORAL
  Filled 2012-10-11: qty 3

## 2012-10-11 MED ORDER — ONDANSETRON HCL 4 MG PO TABS: 4.0000 mg | ORAL_TABLET | Freq: Four times a day (QID) | ORAL | Status: DC | PRN

## 2012-10-11 NOTE — H&P (Signed)
[facility] AD[facility] History and Physical  [name] ZOX:[id number] DOB: 1961-08-12 DOA: 10/11/2012    Chief Complaint: Cough and shortness of breath   HPI: [name] is a 52 y.o. female with past medical history most significant for diabetes, hypertension, hepatitis C, chronic pain and history of pancreatitis who presented to [facility] long ER with chief complaints of shortness of breath patient said that she has been sick for last 3 weeks, started coughing about 3 weeks ago which was productive and she was bringing up white to yellow sputum. Patient also has been having fevers on and off since last 3 weeks. Fevers has been associated with chills. Symptoms have progressively been getting worse to a point where she could not breathe and decided to come to the ER. Patient was seen by her primary care physician who as part the patient did not give her any treatment. Patient was found to be tachycardic, tachypneic, fevers of 101 in ER. Patient has a white count up to 16,000 and chest x-ray which is suggestive of right upper lobe pneumonia. Patient will be admitted to step down unit.   Review of Systems: The patient denies anorexia, fever, weight loss,, vision loss, decreased hearing, hoarseness, syncope, dyspnea on exertion, peripheral edema, balance deficits, hemoptysis, abdominal pain, melena, hematochezia, severe indigestion/heartburn, hematuria, incontinence, genital sores, muscle weakness, suspicious skin lesions, transient blindness, difficulty walking, depression, unusual weight change, abnormal bleeding, enlarged lymph nodes, angioedema, and breast masses.  + chest pain,  + SOB  Past Medical History  Diagnosis Date  . Glaucoma   . Diabetes mellitus without complication   . Hypertension   . Hepatitis C   . Pancreatitis   . Neuropathy    History reviewed. No pertinent past surgical history. Social History:  reports that she has quit smoking. She does  not have any smokeless tobacco history on file. She reports that she drinks alcohol. She reports that she does not use illicit drugs.   Allergies  Allergen Reactions  . Iohexol Other (See Comments)    "severe burning"  . Penicillins Hives    History reviewed. No pertinent family history.   Prior to Admission medications   Medication Sig Start Date End Date Taking? Authorizing Provider  ALPRAZolam Prudy Feeler) 1 MG tablet Take 1 mg by mouth 3 (three) times daily.   Yes Historical Provider, MD  amitriptyline (ELAVIL) 50 MG tablet Take 50 mg by mouth daily.     Yes Historical Provider, MD  brinzolamide (AZOPT) 1 % ophthalmic suspension 1 drop daily.     Yes Historical Provider, MD  cycloSPORINE (RESTASIS) 0.05 % ophthalmic emulsion 1 drop 2 (two) times daily.     Yes Historical Provider, MD  gabapentin (NEURONTIN) 300 MG capsule Take 300 mg by mouth 3 (three) times daily.     Yes Historical Provider, MD  glimepiride (AMARYL) 2 MG tablet Take 2 mg by mouth daily.     Yes Historical Provider, MD  HYDROcodone-acetaminophen (LORCET) 10-650 MG per tablet Take 1 tablet by mouth every 6 (six) hours as needed.     Yes Historical Provider, MD  HYDROmorphone (DILAUDID) 4 MG tablet Take 4-8 mg by mouth every 4 (four) hours as needed. pain   Yes Historical Provider, MD  indomethacin (INDOCIN SR) 75 MG CR capsule Take 75 mg by mouth 2 (two) times daily.     Yes Historical Provider, MD  lisinopril (PRINIVIL,ZESTRIL) 5 MG tablet Take 5 mg by mouth daily.  Yes Historical Provider, MD  Loratadine (CLARITIN) 10 MG CAPS Take 10 mg by mouth daily.   Yes Historical Provider, MD  meclizine (ANTIVERT) 25 MG tablet Take 25 mg by mouth 3 (three) times daily as needed.     Yes Historical Provider, MD  methocarbamol (ROBAXIN) 750 MG tablet Take 750 mg by mouth 2 (two) times daily as needed. For muscle spasms     Yes Historical Provider, MD  omeprazole (PRILOSEC) 20 MG capsule Take 20 mg by mouth daily.     Yes  Historical Provider, MD  polyethylene glycol (MIRALAX / GLYCOLAX) packet Take 17 g by mouth daily.   Yes Historical Provider, MD  promethazine (PHENERGAN) 25 MG tablet Take 50 mg by mouth 2 (two) times daily as needed. For nausea    Yes Historical Provider, MD  simvastatin (ZOCOR) 40 MG tablet Take 40 mg by mouth daily.     Yes Historical Provider, MD  sulfaSALAzine (AZULFIDINE) 500 MG tablet Take 1,000 mg by mouth 2 (two) times daily.     Yes Historical Provider, MD  traZODone (DESYREL) 50 MG tablet Take 50 mg by mouth daily.     Yes Historical Provider, MD   Physical Exam: Filed Vitals:   10/11/12 1700 10/11/12 1800 10/11/12 1912 10/11/12 2012  BP: 107/80 105/75  97/65  Pulse: 74 131  128  Temp:  99.2 F (37.3 C) 101.1 F (38.4 C)   TempSrc:  Oral Rectal   Resp: 18 21  18   Height: 5\' 6"  (1.676 m)     Weight: 100 lb (45.36 kg)     SpO2: 96% 93%  90%     General:  Alert, lethargic, no acute distress, warm to touch   Eyes: PERRLA, EOMI  ENT: dry mucous membrane  Neck: supple, no lymphadenopathy  Cardiovascular: Tachycardic, regular rhythm, no murmur, rubs or gallop  Respiratory: tachypneic, Clear to auscultation bilaterally.   Abdomen: soft,  no organomegaly, bowel sounds normal, no tenderness and nondistended   Skin: warm and dry  Musculoskeletal: Moving all four extremities.   Psychiatric: Normal mood and affect.   Neurologic: AXOx3 , grossly normal.   Labs on Admission:  Basic Metabolic Panel:  Lab 10/11/12 1610  NA 136  K 3.0*  CL 104  CO2 10*  GLUCOSE 61*  BUN 27*  CREATININE 2.16*  CALCIUM 8.3*  MG --  PHOS --   Liver Function Tests:  Lab 10/11/12 1752  AST 76*  ALT 35  ALKPHOS 167*  BILITOT 0.2*  PROT 7.2  ALBUMIN 2.8*    Lab 10/11/12 1752  LIPASE 11  AMYLASE --   CBC:  Lab 10/11/12 1752  WBC 16.7*  NEUTROABS --  HGB 13.8  HCT 39.7  MCV 86.1  PLT 312    Radiological Exams on Admission: Dg Chest Port 1 View  10/11/2012   *RADIOLOGY REPORT*  Clinical Data: 52 year old female with shortness of breath cough and chest pain.  PORTABLE CHEST - 1 VIEW  Comparison: 07/29/2011 and earlier.  Findings: Portable semi upright AP view 1901 hours.  No asymmetric right upper lobe opacity is faint and nodular.  Appears to abut the right minor fissure.  Stable lung volumes.  Cardiac size and mediastinal contours are within normal limits.  Visualized tracheal air column is within normal limits.  No pneumothorax or effusion.  IMPRESSION: No asymmetric right upper lobe opacity, nonspecific but favor developing right upper lobe pneumonia.   Original Report Authenticated By: Erskine Speed, M.D.  EKG: sinus tachycardia, HR- 130 bpm, no acute ST- T wave changes.   Assessment/Plan Principal Problem:  *Sepsis Active Problems:  DIABETES MELLITUS, TYPE II  ANEMIA-IRON DEFICIENCY  ANXIETY  DEPRESSION  ASTHMA  Community acquired pneumonia   Sepsis: likely 2/2 CAP .Patient presents with weakness and fatigue for last couple of weeks. He reports right sided pleuritic chest pain and SOB. He was noted to be febrile with Tmax of 101.1 in the ED.  tachycardic and tachypneic. His WBC is elevated to 16.7. CXR is suspicious for right upper lobe pneumonia. His UA is positive for leucocytes and patient is asymptomatic for urinary complaints. The patient is allergic to penicillin and hence, start her on aztreonam for coverage of Pseudomonas, although patient is more likely fits into the picture of community-acquired pneumonia am covering her for MRSA and Pseudomonas at this time given that she is sepsis and crumble easily. We can switch antibiotics in the morning once patient is stable. - Admit to step down unit -Check strep, legionella urinary antigen -F/U blood, sputum and urine culture -IVF @ 125 an hour -Continue Aztreonam. Add azithromycin. Vancomycin -Flu panel -Start Tamiflu -Consult to [facility] for monitoring (talked to Vanetta Mulders MD)  Anion  gap metabolic acidosis: 2/2 sepsis. See #1. Her lactate is normal.   Hypokalemia: likely 2/2 vomiting. Check Mg. Replete  AKI;  Likely pre- renal in etiology 2/2 dehydration from emesis. Hold lisinopril. IVF Check BMET in AM  DM: Last AIC is 5.5 from 06/10. Recheck Hb AIC.  SSI  Depression/Anxiety: Stable. Denies SI/ thoughts.  Continue home meds.   DVT: Lovenox  Code Status: Full (must indicate code status--if unknown or must be presumed, indicate so) Family Communication: Patient and family at the bedside were updated on the plan of care. Disposition Plan: pending until further improvement of his symptoms. Anticipated d/c likely in 3-4 days  Time spent: 70 minutes  Airen Dales [facility]  If 7PM-7AM, please contact night-coverage [email or web address] Password [facility] 10/11/2012, 10:08 PM

## 2012-10-11 NOTE — ED Notes (Signed)
ZOX:[id number]<AV> Expected date:10/11/12<BR> Expected time: 4:54 PM<BR> Means of arrival:Ambulance<BR> Comments:<BR> Gen weakness x 3 weeks

## 2012-10-11 NOTE — ED Notes (Signed)
Pt placed on 2L O2 [place]  For osygen sats of 91%

## 2012-10-11 NOTE — ED Provider Notes (Signed)
History    52yf with multiple complaints. Poor historian. Vague and somewhat tangential. Unclear if may have psychiatric illness/personality disorder? Pt says feels generally tired. Generally worsening over past couple weeks. Occasional cough and SOB. Epigastric and R sided CP. Worse when coughing. NBNB emesis. Not sure if abdominal pain or vomiting started first. No sick contacts. Chills.  No intervention prior to arrival.   CSN: [id number]  Arrival date & time 10/11/12  1659   First MD Initiated Contact with Patient 10/11/12 1724      No chief complaint on file.   (Consider location/radiation/quality/duration/timing/severity/associated sxs/prior treatment) HPI  History reviewed. No pertinent past medical history.  History reviewed. No pertinent past surgical history.  History reviewed. No pertinent family history.  History  Substance Use Topics  . Smoking status: Not on file  . Smokeless tobacco: Not on file  . Alcohol Use: Not on file    OB History    Grav Para Term Preterm Abortions TAB SAB Ect Mult Living                  Review of Systems  All systems reviewed and negative, other than as noted in HPI.   Allergies  Iohexol and Penicillins  Home Medications   Current Outpatient Rx  Name  Route  Sig  Dispense  Refill  . ALPRAZOLAM 1 MG PO TABS   Oral   Take 1 mg by mouth 3 (three) times daily.         Marland Kitchen AMITRIPTYLINE HCL 50 MG PO TABS   Oral   Take 50 mg by mouth daily.           Marland Kitchen BRINZOLAMIDE 1 % OP SUSP      1 drop daily.           . CYCLOSPORINE 0.05 % OP EMUL      1 drop 2 (two) times daily.           Marland Kitchen GABAPENTIN 300 MG PO CAPS   Oral   Take 300 mg by mouth 3 (three) times daily.           Marland Kitchen GLIMEPIRIDE 2 MG PO TABS   Oral   Take 2 mg by mouth daily.           Marland Kitchen HYDROCODONE-ACETAMINOPHEN 10-650 MG PO TABS   Oral   Take 1 tablet by mouth every 6 (six) hours as needed.           Marland Kitchen HYDROMORPHONE HCL 4 MG PO TABS   Oral   Take 4-8 mg by mouth every 4 (four) hours as needed. pain         . INDOMETHACIN ER 75 MG PO CPCR   Oral   Take 75 mg by mouth 2 (two) times daily.           Marland Kitchen LISINOPRIL 5 MG PO TABS   Oral   Take 5 mg by mouth daily.           Marland Kitchen LORATADINE 10 MG PO CAPS   Oral   Take 10 mg by mouth daily.         Marland Kitchen MECLIZINE HCL 25 MG PO TABS   Oral   Take 25 mg by mouth 3 (three) times daily as needed.           . METHOCARBAMOL 750 MG PO TABS   Oral   Take 750 mg by mouth 2 (two) times daily as needed. For muscle spasms           .  OMEPRAZOLE 20 MG PO CPDR   Oral   Take 20 mg by mouth daily.           Marland Kitchen POLYETHYLENE GLYCOL 3350 PO PACK   Oral   Take 17 g by mouth daily.         Marland Kitchen PROMETHAZINE HCL 25 MG PO TABS   Oral   Take 50 mg by mouth 2 (two) times daily as needed. For nausea          . SIMVASTATIN 40 MG PO TABS   Oral   Take 40 mg by mouth daily.           . SULFASALAZINE 500 MG PO TABS   Oral   Take 1,000 mg by mouth 2 (two) times daily.           . TRAZODONE HCL 50 MG PO TABS   Oral   Take 50 mg by mouth daily.             BP 107/80  Pulse 74  Resp 18  Ht 5\' 6"  (1.676 m)  Wt 100 lb (45.36 kg)  BMI 16.14 kg/m2  SpO2 96%  Physical Exam  Nursing note and vitals reviewed. Constitutional: She appears well-developed and well-nourished. No distress.  HENT:  Head: Normocephalic and atraumatic.  Mouth/Throat: Oropharynx is clear and moist.  Eyes: Conjunctivae normal are normal. Right eye exhibits no discharge. Left eye exhibits no discharge.  Neck: Neck supple.  Cardiovascular: Regular rhythm and normal heart sounds.  Exam reveals no gallop and no friction rub.   No murmur heard.      Tachycardic with regular rhythm  Pulmonary/Chest: Effort normal and breath sounds normal.       Mild tachypnea  Abdominal: Soft. She exhibits no distension. There is no tenderness.  Musculoskeletal: She exhibits no edema and no tenderness.       Lower  extremities symmetric as compared to each other. No calf tenderness. Negative Homan's. No palpable cords.    Neurological: She is alert.  Skin: Skin is warm and dry. She is not diaphoretic.  Psychiatric: Her behavior is normal. Thought content normal.    ED Course  Procedures (including critical care time)  CRITICAL CARE Performed by: Raeford Razor   Total critical care time: 30 minutes  Critical care time was exclusive of separately billable procedures and treating other patients.  Critical care was necessary to treat or prevent imminent or life-threatening deterioration.  Critical care was time spent personally by me on the following activities: development of treatment plan with patient and/or surrogate as well as nursing, discussions with consultants, evaluation of patient's response to treatment, examination of patient, obtaining history from patient or surrogate, ordering and performing treatments and interventions, ordering and review of laboratory studies, ordering and review of radiographic studies, pulse oximetry and re-evaluation of patient's condition.   Labs Reviewed  URINE RAPID DRUG SCREEN (HOSP PERFORMED) - Abnormal; Notable for the following:    Opiates POSITIVE (*)     All other components within normal limits  COMPREHENSIVE METABOLIC PANEL - Abnormal; Notable for the following:    Potassium 3.0 (*)     CO2 10 (*)     Glucose, Bld 61 (*)     BUN 27 (*)     Creatinine, Ser 2.16 (*)     Calcium 8.3 (*)     Albumin 2.8 (*)     AST 76 (*)     Alkaline Phosphatase 167 (*)  Total Bilirubin 0.2 (*)     GFR calc non Af Amer 25 (*)     GFR calc Af Amer 29 (*)     All other components within normal limits  CBC - Abnormal; Notable for the following:    WBC 16.7 (*)     All other components within normal limits  URINALYSIS, ROUTINE W REFLEX MICROSCOPIC - Abnormal; Notable for the following:    APPearance CLOUDY (*)     Hgb urine dipstick SMALL (*)      Bilirubin Urine SMALL (*)     Protein, ur 30 (*)     Leukocytes, UA MODERATE (*)     All other components within normal limits  URINE MICROSCOPIC-ADD ON - Abnormal; Notable for the following:    Squamous Epithelial / LPF FEW (*)     Casts GRANULAR CAST (*)     All other components within normal limits  ETHANOL  LIPASE, BLOOD  LACTIC ACID, PLASMA  CULTURE, BLOOD (ROUTINE X 2)  CULTURE, BLOOD (ROUTINE X 2)  URINE CULTURE   Dg Chest Port 1 View  10/11/2012  *RADIOLOGY REPORT*  Clinical Data: 52 year old female with shortness of breath cough and chest pain.  PORTABLE CHEST - 1 VIEW  Comparison: 07/29/2011 and earlier.  Findings: Portable semi upright AP view 1901 hours.  No asymmetric right upper lobe opacity is faint and nodular.  Appears to abut the right minor fissure.  Stable lung volumes.  Cardiac size and mediastinal contours are within normal limits.  Visualized tracheal air column is within normal limits.  No pneumothorax or effusion.  IMPRESSION: No asymmetric right upper lobe opacity, nonspecific but favor developing right upper lobe pneumonia.   Original Report Authenticated By: Erskine Speed, M.D.     EKG:  Rhythm: Vent. rate 135 BPM PR interval 152 ms QRS duration 68 ms QT/QTc 248/372 ms ST segments: NS ST changes   1. Severe sepsis   2. CAP (community acquired pneumonia)   3. Metabolic acidosis   4. ARF (acute renal failure)   5. Hypokalemia    MDM  52yf with multiple complaints. Difficult historian, but legitimately ill. Febirle and sinus tachycardia. Severe sepsis with metabolic acidosis and ARF. CXR with R sided opacity which likely pneumonia given R sided CP and increased cough. Levaquin initially ordered for CAP, but given severity of illness broadened tx with vanc and aztreonam given PCN allergy. No recent hospitalizations.         Raeford Razor, MD 10/11/12 2055

## 2012-10-11 NOTE — ED Notes (Signed)
To return call for report from stepdown, when nurse arrives

## 2012-10-11 NOTE — ED Notes (Signed)
Portable to X-ray

## 2012-10-12 ENCOUNTER — Inpatient Hospital Stay (HOSPITAL_COMMUNITY): Payer: Medicare Other

## 2012-10-12 DIAGNOSIS — J101 Influenza due to other identified influenza virus with other respiratory manifestations: Secondary | ICD-10-CM | POA: Diagnosis present

## 2012-10-12 DIAGNOSIS — J45909 Unspecified asthma, uncomplicated: Secondary | ICD-10-CM

## 2012-10-12 DIAGNOSIS — A419 Sepsis, unspecified organism: Secondary | ICD-10-CM

## 2012-10-12 DIAGNOSIS — R1032 Left lower quadrant pain: Secondary | ICD-10-CM

## 2012-10-12 DIAGNOSIS — R1031 Right lower quadrant pain: Secondary | ICD-10-CM | POA: Diagnosis present

## 2012-10-12 DIAGNOSIS — F411 Generalized anxiety disorder: Secondary | ICD-10-CM

## 2012-10-12 DIAGNOSIS — E119 Type 2 diabetes mellitus without complications: Secondary | ICD-10-CM

## 2012-10-12 LAB — CBC
MCV: 87.4 fL (ref 78.0–100.0)
Platelets: 222 10*3/uL (ref 150–400)
RBC: 4.22 MIL/uL (ref 3.87–5.11)
RDW: 14.9 % (ref 11.5–15.5)
WBC: 14.4 10*3/uL — ABNORMAL HIGH (ref 4.0–10.5)

## 2012-10-12 LAB — COMPREHENSIVE METABOLIC PANEL
Albumin: 2.1 g/dL — ABNORMAL LOW (ref 3.5–5.2)
BUN: 22 mg/dL (ref 6–23)
Chloride: 113 mEq/L — ABNORMAL HIGH (ref 96–112)
Creatinine, Ser: 1.77 mg/dL — ABNORMAL HIGH (ref 0.50–1.10)
GFR calc Af Amer: 37 mL/min — ABNORMAL LOW (ref >90)
Glucose, Bld: 88 mg/dL (ref 70–99)
Total Bilirubin: 0.1 mg/dL — ABNORMAL LOW (ref 0.3–1.2)

## 2012-10-12 LAB — MRSA PCR SCREENING: MRSA by PCR: NEGATIVE

## 2012-10-12 LAB — URINE CULTURE: Colony Count: 95000

## 2012-10-12 LAB — LEGIONELLA ANTIGEN, URINE: Legionella Antigen, Urine: NEGATIVE

## 2012-10-12 LAB — EXPECTORATED SPUTUM ASSESSMENT W GRAM STAIN, RFLX TO RESP C: Special Requests: NORMAL

## 2012-10-12 LAB — HEMOGLOBIN A1C
Hgb A1c MFr Bld: 5.5 % (ref <5.7)
Mean Plasma Glucose: 111 mg/dL (ref <117)

## 2012-10-12 LAB — LACTIC ACID, PLASMA: Lactic Acid, Venous: 1 mmol/L (ref 0.5–2.2)

## 2012-10-12 LAB — GLUCOSE, CAPILLARY
Glucose-Capillary: 75 mg/dL (ref 70–99)
Glucose-Capillary: 71 mg/dL (ref 70–99)

## 2012-10-12 LAB — INFLUENZA PANEL BY PCR (TYPE A & B): Influenza B By PCR: NEGATIVE

## 2012-10-12 MED ORDER — METOPROLOL TARTRATE 1 MG/ML IV SOLN
5.0000 mg | Freq: Once | INTRAVENOUS | Status: AC
  Administered 2012-10-12: 5 mg via INTRAVENOUS
  Filled 2012-10-12: qty 5

## 2012-10-12 MED ORDER — SODIUM CHLORIDE 0.9 % IV BOLUS (SEPSIS)
500.0000 mL | Freq: Once | INTRAVENOUS | Status: AC
  Administered 2012-10-12: 500 mL via INTRAVENOUS

## 2012-10-12 MED ORDER — ENOXAPARIN SODIUM 30 MG/0.3ML ~~LOC~~ SOLN
30.0000 mg | Freq: Every day | SUBCUTANEOUS | Status: DC
  Administered 2012-10-12 – 2012-10-15 (×4): 30 mg via SUBCUTANEOUS
  Filled 2012-10-12 (×6): qty 0.3

## 2012-10-12 MED ORDER — VANCOMYCIN HCL 500 MG IV SOLR
500.0000 mg | INTRAVENOUS | Status: DC
  Administered 2012-10-13: 500 mg via INTRAVENOUS
  Filled 2012-10-12: qty 500

## 2012-10-12 MED ORDER — SODIUM CHLORIDE 0.9 % IV BOLUS (SEPSIS): 1000.0000 mL | Freq: Once | INTRAVENOUS | Status: DC

## 2012-10-12 MED ORDER — BIOTENE DRY MOUTH MT LIQD
15.0000 mL | Freq: Two times a day (BID) | OROMUCOSAL | Status: DC
  Administered 2012-10-12 – 2012-10-15 (×8): 15 mL via OROMUCOSAL

## 2012-10-12 MED ORDER — ACETAMINOPHEN 325 MG PO TABS
650.0000 mg | ORAL_TABLET | Freq: Four times a day (QID) | ORAL | Status: DC | PRN
  Administered 2012-10-12 – 2012-10-13 (×3): 650 mg via ORAL
  Filled 2012-10-12 (×4): qty 2

## 2012-10-12 MED ORDER — INSULIN ASPART 100 UNIT/ML ~~LOC~~ SOLN
0.0000 [IU] | Freq: Three times a day (TID) | SUBCUTANEOUS | Status: DC
  Administered 2012-10-14: 3 [IU] via SUBCUTANEOUS
  Administered 2012-10-14: 2 [IU] via SUBCUTANEOUS

## 2012-10-12 NOTE — Progress Notes (Signed)
eLink Physician-Brief Progress Note Patient Name: [name] DOB: Oct 02, 1960 MRN: [id number]  Date of Service  10/12/2012   HPI/Events of Note  Although MAP is reasonable at 72 with BP of 89/68 patient remains tachycardia with no fever currently.  HR if 126. Has received a total of 3.5 liters of fluid in both ED and ICU.  Is sleeping currently and had received one dose of dilaudid 1 mg at 12 MN.  Sats are 91% with RR of 21    eICU Interventions  Plan: Additional 500 cc bolus of NS for BP/HR support.   Intervention Category Intermediate Interventions: Hypotension - evaluation and management;Arrhythmia - evaluation and management  DETERDING,ELIZABETH 10/12/2012, 2:52 AM

## 2012-10-12 NOTE — Progress Notes (Signed)
ANTIBIOTIC CONSULT NOTE - INITIAL  Pharmacy Consult for vancomycin/aztreonam Indication: pneumonia  Allergies  Allergen Reactions  . Iohexol Other (See Comments)    "severe burning"  . Penicillins Hives    Patient Measurements: Height: 5\' 6"  (167.6 cm) Weight: 92 lb 6 oz (41.9 kg) IBW/kg (Calculated) : 59.3  Adjusted Body Weight:   Vital Signs: Temp: 98.5 F (36.9 C) (01/12 0000) Temp src: Oral (01/12 0000) BP: 97/65 mmHg (01/11 2012) Pulse Rate: 128  (01/11 2012) Intake/Output from previous day: 01/11 0701 - 01/12 0700 In: 120 [P.O.:120] Out: -  Intake/Output from this shift:    Labs:  Basename 10/11/12 1752  WBC 16.7*  HGB 13.8  PLT 312  LABCREA --  CREATININE 2.16*   Estimated Creatinine Clearance: 19.9 ml/min (by C-G formula based on Cr of 2.16). No results found for this basename: VANCOTROUGH:2,VANCOPEAK:2,VANCORANDOM:2,GENTTROUGH:2,GENTPEAK:2,GENTRANDOM:2,TOBRATROUGH:2,TOBRAPEAK:2,TOBRARND:2,AMIKACINPEAK:2,AMIKACINTROU:2,AMIKACIN:2, in the last 72 hours   Microbiology: Recent Results (from the past 720 hour(s))  MRSA PCR SCREENING     Status: Normal   Collection Time   10/11/12 11:27 PM      Component Value Range Status Comment   MRSA by PCR NEGATIVE  NEGATIVE Final     Medical History: Past Medical History  Diagnosis Date  . Glaucoma   . Diabetes mellitus without complication   . Hypertension   . Hepatitis C   . Pancreatitis   . Neuropathy     Medications:  Anti-infectives     Start     Dose/Rate Route Frequency Ordered Stop   10/13/12 2000   vancomycin (VANCOCIN) 500 mg in sodium chloride 0.9 % 100 mL IVPB        500 mg 100 mL/hr over 60 Minutes Intravenous Every 48 hours 10/12/12 0128     10/11/12 2300   azithromycin (ZITHROMAX) 500 mg in dextrose 5 % 250 mL IVPB        500 mg 250 mL/hr over 60 Minutes Intravenous Every 24 hours 10/11/12 2257     10/11/12 2300   oseltamivir (TAMIFLU) capsule 75 mg        75 mg Oral 2 times daily  10/11/12 2257 10/16/12 2159   10/11/12 2200   aztreonam (AZACTAM) 1 g in dextrose 5 % 50 mL IVPB        1 g 100 mL/hr over 30 Minutes Intravenous 3 times per day 10/11/12 1959     10/11/12 2000   vancomycin (VANCOCIN) IVPB 1000 mg/200 mL premix        1,000 mg 200 mL/hr over 60 Minutes Intravenous  Once 10/11/12 1959 10/11/12 2117   10/11/12 1945   levofloxacin (LEVAQUIN) IVPB 750 mg        750 mg 100 mL/hr over 90 Minutes Intravenous  Once 10/11/12 1935 10/11/12 2207         Assessment: Patient with PNA.  First dose of antibiotics already given.  Patient with poor renal function.  Goal of Therapy:  Vancomycin trough level 15-20 mcg/ml  Plan:  Measure antibiotic drug levels at steady state Follow up culture results Vancomycin 500mg  iv q48hr,   Darlina Guys, Lynkin Saini Crowford 10/12/2012,1:31 AM

## 2012-10-12 NOTE — Consult Note (Signed)
PULMONARY  / CRITICAL CARE MEDICINE  Name: [name] MRN: [id number] DOB: Oct 24, 1960    LOS: 1  REFERRING MD :  Cena Benton  CHIEF COMPLAINT:  Dyspnea, tachycardia  BRIEF PATIENT DESCRIPTION: 52 yo HTN, DM, Hep C. Admitted 1/11 with dyspnea, cough, fever. CXR with RUL PNA. Progressive dyspnea, tachycardia, pulmonary infiltrates. CCM consulted 1/12.   LINES / TUBES:  CULTURES: Influenza A 1/11 >> positive H1N1 1/11 >> positive Blood 1/11 >>  resp 1/11 >>  Urine 1/11 >>   ANTIBIOTICS: Aztreonam 1/11 >>  azithro 1/11 >>  vanco 1/11 >>   SIGNIFICANT EVENTS:    LEVEL OF CARE:  SDU PRIMARY SERVICE:  [facility]:  PCCM CODE STATUS: Full  DIET:  Full diet DVT Px:  Enoxaparin (refuses), SCD's GI Px:  N/a   HISTORY OF PRESENT ILLNESS:  52 yo HTN, DM, Hep C. Admitted 1/11 with dyspnea, cough, fever. CXR with RUL PNA. Progressive dyspnea, tachycardia, pulmonary infiltrates. CCM consulted 1/12.   PAST MEDICAL HISTORY :  Past Medical History  Diagnosis Date  . Glaucoma   . Diabetes mellitus without complication   . Hypertension   . Hepatitis C   . Pancreatitis   . Neuropathy    History reviewed. No pertinent past surgical history. Prior to Admission medications   Medication Sig Start Date End Date Taking? Authorizing Provider  ALPRAZolam Prudy Feeler) 1 MG tablet Take 1 mg by mouth 3 (three) times daily.   Yes Historical Provider, MD  amitriptyline (ELAVIL) 50 MG tablet Take 50 mg by mouth daily.     Yes Historical Provider, MD  brinzolamide (AZOPT) 1 % ophthalmic suspension 1 drop daily.     Yes Historical Provider, MD  cycloSPORINE (RESTASIS) 0.05 % ophthalmic emulsion 1 drop 2 (two) times daily.     Yes Historical Provider, MD  gabapentin (NEURONTIN) 300 MG capsule Take 300 mg by mouth 3 (three) times daily.     Yes Historical Provider, MD  glimepiride (AMARYL) 2 MG tablet Take 2 mg by mouth daily.     Yes Historical Provider, MD  HYDROcodone-acetaminophen (LORCET) 10-650  MG per tablet Take 1 tablet by mouth every 6 (six) hours as needed.     Yes Historical Provider, MD  HYDROmorphone (DILAUDID) 4 MG tablet Take 4-8 mg by mouth every 4 (four) hours as needed. pain   Yes Historical Provider, MD  indomethacin (INDOCIN SR) 75 MG CR capsule Take 75 mg by mouth 2 (two) times daily.     Yes Historical Provider, MD  lisinopril (PRINIVIL,ZESTRIL) 5 MG tablet Take 5 mg by mouth daily.     Yes Historical Provider, MD  Loratadine (CLARITIN) 10 MG CAPS Take 10 mg by mouth daily.   Yes Historical Provider, MD  meclizine (ANTIVERT) 25 MG tablet Take 25 mg by mouth 3 (three) times daily as needed.     Yes Historical Provider, MD  methocarbamol (ROBAXIN) 750 MG tablet Take 750 mg by mouth 2 (two) times daily as needed. For muscle spasms     Yes Historical Provider, MD  omeprazole (PRILOSEC) 20 MG capsule Take 20 mg by mouth daily.     Yes Historical Provider, MD  polyethylene glycol (MIRALAX / GLYCOLAX) packet Take 17 g by mouth daily.   Yes Historical Provider, MD  promethazine (PHENERGAN) 25 MG tablet Take 50 mg by mouth 2 (two) times daily as needed. For nausea    Yes Historical Provider, MD  simvastatin (ZOCOR) 40 MG tablet Take 40 mg by mouth  daily.     Yes Historical Provider, MD  sulfaSALAzine (AZULFIDINE) 500 MG tablet Take 1,000 mg by mouth 2 (two) times daily.     Yes Historical Provider, MD  traZODone (DESYREL) 50 MG tablet Take 50 mg by mouth daily.     Yes Historical Provider, MD   Allergies  Allergen Reactions  . Iohexol Other (See Comments)    "severe burning"  . Penicillins Hives    FAMILY HISTORY:  History reviewed. No pertinent family history. SOCIAL HISTORY:  reports that she has quit smoking. She does not have any smokeless tobacco history on file. She reports that she drinks alcohol. She reports that she does not use illicit drugs.  REVIEW OF SYSTEMS:  Dyspnea, cough, nausea, anxiety. Chronic hip and low back pain  INTERVAL HISTORY:   VITAL  SIGNS: Temp:  [97.4 F (36.3 C)-101.1 F (38.4 C)] 99.2 F (37.3 C) (01/12 0800) Pulse Rate:  [50-144] 127  (01/12 1200) Resp:  [17-32] 32  (01/12 1200) BP: (78-111)/(55-82) 89/59 mmHg (01/12 1200) SpO2:  [89 %-97 %] 94 % (01/12 1200) Weight:  [41.9 kg (92 lb 6 oz)-45.36 kg (100 lb)] 41.9 kg (92 lb 6 oz) (01/12 0000) HEMODYNAMICS:   VENTILATOR SETTINGS:   INTAKE / OUTPUT: Intake/Output      01/11 0701 - 01/12 0700 01/12 0701 - 01/13 0700   P.O. 120    I.V. (mL/kg) 650 (15.5) 275 (6.6)   IV Piggyback 1350 500   Total Intake(mL/kg) 2120 (50.6) 775 (18.5)   Urine (mL/kg/hr) 775 (0.8) 475 (2)   Total Output 775 475   Net +1345 +300        Urine Occurrence 2 x 1 x   Stool Occurrence 1 x      PHYSICAL EXAMINATION: General:  Ill appearing, NAD,   Neuro:  Slow to respond but appropriate, moves all ext,  HEENT:  Proptosis B, OP moist Cardiovascular: Tachy, regular, no M Lungs:  Coarse rhonchi B, diffuse crackles Abdomen:  Tender to palp in lower quadrants  Musculoskeletal:  No edema Skin:  No rash   LABS: Cbc  Lab 10/12/12 0352 10/11/12 1752  WBC 14.4* --  HGB 12.6 13.8  HCT 36.9 39.7  PLT 222 312    Chemistry   Lab 10/12/12 0352 10/11/12 1752  NA 138 136  K 4.0 3.0*  CL 113* 104  CO2 11* 10*  BUN 22 27*  CREATININE 1.77* 2.16*  CALCIUM 6.7* 8.3*  MG -- --  PHOS -- --  GLUCOSE 88 61*    Liver fxn  Lab 10/12/12 0352 10/11/12 1752  AST 64* 76*  ALT 27 35  ALKPHOS 124* 167*  BILITOT 0.1* 0.2*  PROT 5.4* 7.2  ALBUMIN 2.1* 2.8*   coags No results found for this basename: APTT:3,INR:3 in the last 168 hours Sepsis markers  Lab 10/11/12 1934  LATICACIDVEN 1.5  PROCALCITON --   Cardiac markers No results found for this basename: CKTOTAL:3,CKMB:3,TROPONINI:3 in the last 168 hours BNP No results found for this basename: PROBNP:3 in the last 168 hours ABG No results found for this basename: PHART:3,PCO2ART:3,PO2ART:3,HCO3:3,TCO2:3 in the last 168  hours  CBG trend  Lab 10/12/12 0834  GLUCAP 75    IMAGING:  ECG:  DIAGNOSES: Principal Problem:  *Sepsis Active Problems:  DIABETES MELLITUS, TYPE II  ANXIETY  DEPRESSION  ASTHMA  Community acquired pneumonia  Bilateral lower abdominal discomfort   ASSESSMENT / PLAN:  PULMONARY  ASSESSMENT: Acute resp failure due to H1N1 pneumonitis vs  ARDS RUL CAP  PLAN:   - agree w abx, tamiflu - discussed severity of disease with the patient. She wants to defer ETT/MV as long as possible. Will use BiPAP if she can tolerate but suspect she will require intubation - BD's as ordered - no real room to initiate diuresis given hemodynamics  CARDIOVASCULAR  ASSESSMENT:  Severe sepsis, at risk for septic shock, currently hemodynamically stable.  Sinus tachycardia PLAN:  - if she drops BP will place CVC and initiate EGDT; she has already received some volume resuscitation earlier in the hospitalization - consider gentle volume given sinus tachycardia; could contribute to pulm edema  RENAL  ASSESSMENT:   Acute renal failure, due to sepsis Metabolic acidosis, lactate reassuring 1/11 PLAN:   - volume  - follow S Cr and UOP - recheck lactate  GASTROINTESTINAL  ASSESSMENT:  abd pain, nausea, likely due to viral process PLAN:   - antiemetics - consider KUB or CT scan sbd if she decompensates  HEMATOLOGIC  ASSESSMENT:   PLAN:  - DVT prophylaxis  INFECTIOUS  ASSESSMENT:   RUL CAP on presentation CXR Influenza H1N1 positive Severe Sepsis  PLAN:   - abx coverage for severe CAP, see flowsheet - tamiflu x 10 days+  - follow hemodynamics for evolving septic shock  ENDOCRINE  ASSESSMENT:  DM    PLAN:   - SSI protocol, convert to ICU protocol if she is intubated  NEUROLOGIC  ASSESSMENT:   Mild delirium; very subtle word-finding difficulty, ? Meds, ? Due to critical illness PLAN:   - careful with sedating meds, narcotics  CLINICAL SUMMARY: 52 yo HTN, DM, Hep C.  Admitted 1/11 with dyspnea, cough, fever. CXR with RUL PNA. Progressive dyspnea, tachycardia, pulmonary infiltrates. CCM consulted 1/12. Will likely require ETT/MV. Patient requests a trial of BiPAP to try to avoid.   I have personally obtained a history, examined the patient, evaluated laboratory and imaging results, formulated the assessment and plan and placed orders. CRITICAL CARE: The patient is critically ill with multiple organ systems failure and requires high complexity decision making for assessment and support, frequent evaluation and titration of therapies, application of advanced monitoring technologies and extensive interpretation of multiple databases. Critical Care Time devoted to patient care services described in this note is 60 minutes.    Levy Pupa, MD, PhD 10/12/2012, 2:04 PM [facility] [phone] or if no answer [phone]

## 2012-10-12 NOTE — Progress Notes (Addendum)
[facility] PROGRESS NOTE  [name]:[id number] DOB: 06/21/1961 DOA: 10/11/2012 PCP: No primary provider on file.  Assessment/Plan: 1. Sepsis:  - Continue current antibiotic regimen - agree with covering with tamiflu while influenza panel is pending - Administer 500cc normal saline fluid bolus given tachycardia and elevated BUN/Creatinine ratio, then will plan on continuing normal saline infusion at Addendum at 75 cc/hr - Patient still acute and as such given tachycardia and soft blood pressures would continue to monitor in stepdown.  Should condition worsen would consider transferring to ICU for further evaluation and recommendations.  2. CAP - Patient is currently on IV antibiotic broad spectrum coverage with azithromycin, aztreonam, and vancomycin.  Will continue this regimen at this point - follow temperature and WBC levels.  WBC trending down  3. Asthma - continue supplemental oxygen as needed - Nebulizers  4. Anxiety - continue with alprazolam at home dose as we would want to avoid withdrawal symtpoms  5. DM - currently on sliding scale insuline  - continue to monitor CBG's  6. BL abdominal discomfort - Likely from UTI at this juncture - patient with supra pubic discomfort and cloudy urine with moderate leukocytes and rare bacteria   Code Status: presumed full code at this juncture Family Communication: No family at bedside Disposition Plan: Pending further improvement in clinical condition. Pt will remain in step down at this juncture.   Consultants:  Elink consult reportedly placed by admitting physician to Vanetta Mulders MD  Procedures:  none  Antibiotics:  Please see above  HPI/Subjective: Patient mentions that she has had some abdominal discomfort but otherwise has no new complaints.  Has had coughing but feels better.   Objective: Filed Vitals:   10/12/12 0430 10/12/12 0530 10/12/12 0600 10/12/12 0700  BP: 92/67 91/71 95/64    Pulse: 131 133  136 140  Temp:      TempSrc:      Resp: 20 23 27  32  Height:      Weight:      SpO2: 95% 97% 92% 92%    Intake/Output Summary (Last 24 hours) at 10/12/12 0806 Last data filed at 10/12/12 0700  Gross per 24 hour  Intake   2120 ml  Output    775 ml  Net   1345 ml   Filed Weights   10/11/12 1700 10/12/12 0000  Weight: 45.36 kg (100 lb) 41.9 kg (92 lb 6 oz)    Exam:   General:  Pt laying supine in bed, awake and alert  Cardiovascular: S1 and S2 wnl, no murmurs  Respiratory: diffuse coarse breath sounds, breath sounds BL, mild expiratory wheeze  Abdomen: soft, + suprapubic tenderness, ND  Data Reviewed: Basic Metabolic Panel:  Lab 10/12/12 8119 10/11/12 1752  NA 138 136  K 4.0 3.0*  CL 113* 104  CO2 11* 10*  GLUCOSE 88 61*  BUN 22 27*  CREATININE 1.77* 2.16*  CALCIUM 6.7* 8.3*  MG -- --  PHOS -- --   Liver Function Tests:  Lab 10/12/12 0352 10/11/12 1752  AST 64* 76*  ALT 27 35  ALKPHOS 124* 167*  BILITOT 0.1* 0.2*  PROT 5.4* 7.2  ALBUMIN 2.1* 2.8*    Lab 10/11/12 1752  LIPASE 11  AMYLASE --   No results found for this basename: AMMONIA:5 in the last 168 hours CBC:  Lab 10/12/12 0352 10/11/12 1752  WBC 14.4* 16.7*  NEUTROABS -- --  HGB 12.6 13.8  HCT 36.9 39.7  MCV 87.4 86.1  PLT  222 312   Cardiac Enzymes: No results found for this basename: CKTOTAL:5,CKMB:5,CKMBINDEX:5,TROPONINI:5 in the last 168 hours BNP (last 3 results) No results found for this basename: PROBNP:3 in the last 8760 hours CBG: No results found for this basename: GLUCAP:5 in the last 168 hours  Recent Results (from the past 240 hour(s))  MRSA PCR SCREENING     Status: Normal   Collection Time   10/11/12 11:27 PM      Component Value Range Status Comment   MRSA by PCR NEGATIVE  NEGATIVE Final      Studies: Dg Chest Port 1 View  10/11/2012  *RADIOLOGY REPORT*  Clinical Data: 52 year old female with shortness of breath cough and chest pain.  PORTABLE CHEST - 1 VIEW   Comparison: 07/29/2011 and earlier.  Findings: Portable semi upright AP view 1901 hours.  No asymmetric right upper lobe opacity is faint and nodular.  Appears to abut the right minor fissure.  Stable lung volumes.  Cardiac size and mediastinal contours are within normal limits.  Visualized tracheal air column is within normal limits.  No pneumothorax or effusion.  IMPRESSION: No asymmetric right upper lobe opacity, nonspecific but favor developing right upper lobe pneumonia.   Original Report Authenticated By: Erskine Speed, M.D.     Scheduled Meds:   . sodium chloride   Intravenous STAT  . ALPRAZolam  1 mg Oral TID  . antiseptic oral rinse  15 mL Mouth Rinse BID  . azithromycin (ZITHROMAX) 500 MG IVPB  500 mg Intravenous Q24H  . aztreonam  1 g Intravenous Q8H  . brinzolamide  1 drop Both Eyes Daily  . cycloSPORINE  1 drop Both Eyes BID  . enoxaparin (LOVENOX) injection  40 mg Subcutaneous QHS  . insulin aspart  0-9 Units Subcutaneous TID WC  . oseltamivir  75 mg Oral BID  . sodium chloride  1,000 mL Intravenous Once  . sulfaSALAzine  1,000 mg Oral BID  . vancomycin  500 mg Intravenous Q48H   Continuous Infusions:   . sodium chloride 125 mL/hr at 10/12/12 0119    Principal Problem:  *Sepsis Active Problems:  DIABETES MELLITUS, TYPE II  ANEMIA-IRON DEFICIENCY  ANXIETY  DEPRESSION  ASTHMA  Community acquired pneumonia    Time spent: > 35 minutes    [name]  [facility] Pager [phone]. If 8PM-8AM, please contact night-coverage at [email or web address], password [facility] 10/12/2012, 8:06 AM  LOS: 1 day

## 2012-10-13 ENCOUNTER — Inpatient Hospital Stay (HOSPITAL_COMMUNITY): Payer: Medicare Other

## 2012-10-13 DIAGNOSIS — I10 Essential (primary) hypertension: Secondary | ICD-10-CM

## 2012-10-13 DIAGNOSIS — J80 Acute respiratory distress syndrome: Secondary | ICD-10-CM | POA: Diagnosis present

## 2012-10-13 DIAGNOSIS — K5090 Crohn's disease, unspecified, without complications: Secondary | ICD-10-CM

## 2012-10-13 DIAGNOSIS — J9600 Acute respiratory failure, unspecified whether with hypoxia or hypercapnia: Secondary | ICD-10-CM | POA: Diagnosis present

## 2012-10-13 DIAGNOSIS — J309 Allergic rhinitis, unspecified: Secondary | ICD-10-CM

## 2012-10-13 LAB — COMPREHENSIVE METABOLIC PANEL
ALT: 26 U/L (ref 0–35)
Alkaline Phosphatase: 169 U/L — ABNORMAL HIGH (ref 39–117)
BUN: 22 mg/dL (ref 6–23)
CO2: 10 mEq/L — CL (ref 19–32)
Chloride: 117 mEq/L — ABNORMAL HIGH (ref 96–112)
GFR calc Af Amer: 41 mL/min — ABNORMAL LOW (ref >90)
Glucose, Bld: 100 mg/dL — ABNORMAL HIGH (ref 70–99)
Potassium: 2.9 mEq/L — ABNORMAL LOW (ref 3.5–5.1)
Sodium: 146 mEq/L — ABNORMAL HIGH (ref 135–145)
Total Bilirubin: 0.4 mg/dL (ref 0.3–1.2)
Total Protein: 6.1 g/dL (ref 6.0–8.3)

## 2012-10-13 LAB — CBC
HCT: 38.4 % (ref 36.0–46.0)
Hemoglobin: 14.2 g/dL (ref 12.0–15.0)
MCHC: 37 g/dL — ABNORMAL HIGH (ref 30.0–36.0)
RBC: 4.69 MIL/uL (ref 3.87–5.11)
WBC: 24.8 10*3/uL — ABNORMAL HIGH (ref 4.0–10.5)

## 2012-10-13 LAB — GLUCOSE, CAPILLARY
Glucose-Capillary: 101 mg/dL — ABNORMAL HIGH (ref 70–99)
Glucose-Capillary: 107 mg/dL — ABNORMAL HIGH (ref 70–99)
Glucose-Capillary: 86 mg/dL (ref 70–99)
Glucose-Capillary: 60 mg/dL — ABNORMAL LOW (ref 70–99)

## 2012-10-13 LAB — MAGNESIUM: Magnesium: 1.1 mg/dL — ABNORMAL LOW (ref 1.5–2.5)

## 2012-10-13 MED ORDER — SODIUM BICARBONATE 8.4 % IV SOLN
INTRAVENOUS | Status: DC
  Administered 2012-10-14: via INTRAVENOUS
  Filled 2012-10-13 (×2): qty 100

## 2012-10-13 MED ORDER — SODIUM CHLORIDE 0.9 % IV BOLUS (SEPSIS)
500.0000 mL | Freq: Once | INTRAVENOUS | Status: AC
  Administered 2012-10-13: 500 mL via INTRAVENOUS

## 2012-10-13 MED ORDER — POTASSIUM CHLORIDE CRYS ER 20 MEQ PO TBCR
40.0000 meq | EXTENDED_RELEASE_TABLET | Freq: Once | ORAL | Status: AC
  Administered 2012-10-13: 40 meq via ORAL
  Filled 2012-10-13: qty 2

## 2012-10-13 MED ORDER — POTASSIUM CHLORIDE 10 MEQ/100ML IV SOLN
10.0000 meq | INTRAVENOUS | Status: AC
  Administered 2012-10-13: 10 meq via INTRAVENOUS

## 2012-10-13 MED ORDER — TRAMADOL HCL 50 MG PO TABS
25.0000 mg | ORAL_TABLET | Freq: Four times a day (QID) | ORAL | Status: DC | PRN
  Administered 2012-10-13 (×2): 25 mg via ORAL
  Filled 2012-10-13 (×3): qty 1

## 2012-10-13 MED ORDER — MAGNESIUM SULFATE 40 MG/ML IJ SOLN
2.0000 g | Freq: Once | INTRAMUSCULAR | Status: AC
  Administered 2012-10-14: 2 g via INTRAVENOUS
  Filled 2012-10-13: qty 50

## 2012-10-13 NOTE — Progress Notes (Signed)
[facility] PROGRESS NOTE  [name]:[id number] DOB: [birth year] DOA: 10/11/2012 PCP: No primary provider on file.  Assessment/Plan: 1. Sepsis:  - Continue current antibiotic regimen - agree with covering with tamiflu while influenza panel is pending - Patient required Normal saline fluid boluses overnight.  Blood pressure improved today.  Will d/c dilaudid as this can further decrease patient's blood pressure. - Patient still acute and as such given tachycardia and soft blood pressures would continue to monitor in stepdown.   - Given hypotension and current clinical condition case discussed with PCCM and would like to thank them for their involvement in this case.   2. CAP - Patient is currently on IV antibiotic broad spectrum coverage with azithromycin and vancomycin.  Have discontinued aztreonam and likely d/c vancomycin and azithromycin soon as patient most likely has respiratory distress due to influenza A. - follow temperature and WBC levels.  WBC trending down - Patient is influenza A positive and currently on tamiflu as indicated in pulmonology's notes would continue for 10 days total. Started 10/11/12 - Strep and Legionella urine antigen negative.    3. Asthma - continue supplemental oxygen as needed - Nebulizers  4. Anxiety - continue with alprazolam at home dose as we would want to avoid withdrawal symtpoms  5. DM - currently on sliding scale insuline  - continue to monitor CBG's  6. BL abdominal discomfort - Likely from UTI at this juncture - patient with supra pubic discomfort and cloudy urine with moderate leukocytes and rare bacteria and urine culture reporting multiple morphotypes none predominant.  Therefore will continue to monitor and continue current antibiotic regimen.   Code Status: presumed full code at this juncture Family Communication: No family at bedside Disposition Plan: Pending further improvement in clinical condition. Pt will remain in  step down at this juncture.   Consultants:  Elink consult reportedly placed by admitting physician to Vanetta Mulders MD  Procedures:  none  Antibiotics:  Please see above  HPI/Subjective: Patient has no new complaints states that she feels better today.  No acute issues reported overnight.  Objective: Filed Vitals:   10/13/12 0200 10/13/12 0400 10/13/12 0600 10/13/12 0700  BP: 102/76 120/90 117/79 125/89  Pulse: 118 128 130 129  Temp: 98.1 F (36.7 C) 98.6 F (37 C) 99.3 F (37.4 C) 99.5 F (37.5 C)  TempSrc:      Resp: 26 35 32 30  Height:      Weight:      SpO2: 97% 96% 99% 97%    Intake/Output Summary (Last 24 hours) at 10/13/12 0801 Last data filed at 10/13/12 0600  Gross per 24 hour  Intake   2650 ml  Output   2830 ml  Net   -180 ml   Filed Weights   10/11/12 1700 10/12/12 0000  Weight: 45.36 kg (100 lb) 41.9 kg (92 lb 6 oz)    Exam:   General:  Pt laying supine in bed, awake and alert  Cardiovascular: S1 and S2 wnl, no murmurs  Respiratory: diffuse coarse breath sounds, breath sounds BL  Abdomen: soft, + suprapubic tenderness, ND  Data Reviewed: Basic Metabolic Panel:  Lab 10/12/12 9562 10/11/12 1752  NA 138 136  K 4.0 3.0*  CL 113* 104  CO2 11* 10*  GLUCOSE 88 61*  BUN 22 27*  CREATININE 1.77* 2.16*  CALCIUM 6.7* 8.3*  MG -- --  PHOS -- --   Liver Function Tests:  Lab 10/12/12 0352 10/11/12 1752  AST  64* 76*  ALT 27 35  ALKPHOS 124* 167*  BILITOT 0.1* 0.2*  PROT 5.4* 7.2  ALBUMIN 2.1* 2.8*    Lab 10/11/12 1752  LIPASE 11  AMYLASE --   No results found for this basename: AMMONIA:5 in the last 168 hours CBC:  Lab 10/12/12 0352 10/11/12 1752  WBC 14.4* 16.7*  NEUTROABS -- --  HGB 12.6 13.8  HCT 36.9 39.7  MCV 87.4 86.1  PLT 222 312   Cardiac Enzymes: No results found for this basename: CKTOTAL:5,CKMB:5,CKMBINDEX:5,TROPONINI:5 in the last 168 hours BNP (last 3 results) No results found for this basename: PROBNP:3 in  the last 8760 hours CBG:  Lab 10/12/12 2155 10/12/12 1635 10/12/12 1248 10/12/12 0834  GLUCAP 101* 82 71 75    Recent Results (from the past 240 hour(s))  CULTURE, BLOOD (ROUTINE X 2)     Status: Normal (Preliminary result)   Collection Time   10/11/12  7:34 PM      Component Value Range Status Comment   Specimen Description BLOOD LEFT ARM  5 ML IN [facility] BOTTLE   Final    Special Requests NONE   Final    Culture  Setup Time 10/11/2012 23:00   Final    Culture     Final    Value:        BLOOD CULTURE RECEIVED NO GROWTH TO DATE CULTURE WILL BE HELD FOR 5 DAYS BEFORE ISSUING A FINAL NEGATIVE REPORT   Report Status PENDING   Incomplete   CULTURE, BLOOD (ROUTINE X 2)     Status: Normal (Preliminary result)   Collection Time   10/11/12  7:45 PM      Component Value Range Status Comment   Specimen Description BLOOD LEFT HAND  3 ML IN AEROBIC ONLY   Final    Special Requests NONE   Final    Culture  Setup Time 10/11/2012 22:59   Final    Culture     Final    Value:        BLOOD CULTURE RECEIVED NO GROWTH TO DATE CULTURE WILL BE HELD FOR 5 DAYS BEFORE ISSUING A FINAL NEGATIVE REPORT   Report Status PENDING   Incomplete   URINE CULTURE     Status: Normal   Collection Time   10/11/12  8:01 PM      Component Value Range Status Comment   Specimen Description URINE, CLEAN CATCH   Final    Special Requests NONE   Final    Culture  Setup Time 10/11/2012 23:03   Final    Colony Count 95,000 COLONIES/ML   Final    Culture     Final    Value: Multiple bacterial morphotypes present, none predominant. Suggest appropriate recollection if clinically indicated.   Report Status 10/12/2012 FINAL   Final   MRSA PCR SCREENING     Status: Normal   Collection Time   10/11/12 11:27 PM      Component Value Range Status Comment   MRSA by PCR NEGATIVE  NEGATIVE Final   CULTURE, EXPECTORATED SPUTUM-ASSESSMENT     Status: Normal   Collection Time   10/12/12  4:04 AM      Component Value Range Status Comment    Specimen Description SPUTUM   Final    Special Requests Normal   Final    Sputum evaluation     Final    Value: THIS SPECIMEN IS ACCEPTABLE. RESPIRATORY CULTURE REPORT TO FOLLOW.   Report Status 10/12/2012 FINAL  Final      Studies: Dg Chest 2 View  10/12/2012  *RADIOLOGY REPORT*  Clinical Data: Shortness of breath, weakness, diabetes, pneumonia  CHEST - 2 VIEW  Comparison: 10/11/2012  Findings: Extensive new bilateral airspace process versus edema. Stable heart size.  No effusion or pneumothorax.  Trachea is midline.  Airspace disease is more pronounced in the upper lobes.  IMPRESSION: Worsening diffuse bilateral airspace process compared to yesterday could represent acute edema versus rapid progression of pneumonia.   Original Report Authenticated By: Judie Petit. Miles Costain, M.D.    Dg Chest Port 1 View  10/11/2012  *RADIOLOGY REPORT*  Clinical Data: 52 year old female with shortness of breath cough and chest pain.  PORTABLE CHEST - 1 VIEW  Comparison: 07/29/2011 and earlier.  Findings: Portable semi upright AP view 1901 hours.  No asymmetric right upper lobe opacity is faint and nodular.  Appears to abut the right minor fissure.  Stable lung volumes.  Cardiac size and mediastinal contours are within normal limits.  Visualized tracheal air column is within normal limits.  No pneumothorax or effusion.  IMPRESSION: No asymmetric right upper lobe opacity, nonspecific but favor developing right upper lobe pneumonia.   Original Report Authenticated By: Erskine Speed, M.D.     Scheduled Meds:    . ALPRAZolam  1 mg Oral TID  . antiseptic oral rinse  15 mL Mouth Rinse BID  . azithromycin (ZITHROMAX) 500 MG IVPB  500 mg Intravenous Q24H  . aztreonam  1 g Intravenous Q8H  . brinzolamide  1 drop Both Eyes Daily  . cycloSPORINE  1 drop Both Eyes BID  . enoxaparin (LOVENOX) injection  30 mg Subcutaneous QHS  . insulin aspart  0-9 Units Subcutaneous TID WC  . oseltamivir  75 mg Oral BID  . sulfaSALAzine  1,000 mg  Oral BID  . vancomycin  500 mg Intravenous Q48H   Continuous Infusions:    . sodium chloride 75 mL/hr at 10/12/12 1630    Principal Problem:  *Sepsis Active Problems:  DIABETES MELLITUS, TYPE II  ANXIETY  DEPRESSION  ASTHMA  Community acquired pneumonia  Bilateral lower abdominal discomfort  H1N1 influenza    Time spent: > 35 minutes    Penny Pia  [facility] Pager [phone]. If 8PM-8AM, please contact night-coverage at [email or web address], password [facility] 10/13/2012, 8:01 AM  LOS: 2 days

## 2012-10-13 NOTE — Progress Notes (Signed)
PULMONARY  / CRITICAL CARE MEDICINE  Name: [name] MRN: [id number] DOB: June 02, 1961    LOS: 2  REFERRING MD :  Cena Benton  CHIEF COMPLAINT:  Dyspnea, tachycardia  BRIEF PATIENT DESCRIPTION: 52 yo HTN, DM, Hep C. Admitted 1/11 with dyspnea, cough, fever. CXR with RUL PNA. Progressive dyspnea, tachycardia, pulmonary infiltrates. CCM consulted 1/12.   LINES / TUBES:  CULTURES: Influenza A 1/11 >> positive H1N1 1/11 >> positive Blood 1/11 >>  resp 1/11 >>  Urine 1/11 >>   ANTIBIOTICS: Aztreonam 1/11 >>  azithro 1/11 >>  vanco 1/11 >>  SIGNIFICANT EVENTS:   LEVEL OF CARE:  SDU PRIMARY SERVICE:  [facility]:  PCCM CODE STATUS: Full  DIET:  Full diet DVT Px:  Enoxaparin (refuses), SCD's GI Px:  N/a   HISTORY OF PRESENT ILLNESS:  53 yo HTN, DM, Hep C. Admitted 1/11 with dyspnea, cough, fever. CXR with RUL PNA. Progressive dyspnea, tachycardia, pulmonary infiltrates. CCM consulted 1/12.   INTERVAL HISTORY:  Pt states her breathing may be better today 1/13, BP has stabilized but she continues to have severe sinus tachycardia to 140's  VITAL SIGNS: Temp:  [98.1 F (36.7 C)-100.6 F (38.1 C)] 99.9 F (37.7 C) (01/13 1000) Pulse Rate:  [32-148] 140  (01/13 1000) Resp:  [24-43] 43  (01/13 1000) BP: (78-135)/(55-91) 135/88 mmHg (01/13 1000) SpO2:  [91 %-99 %] 95 % (01/13 1000) HEMODYNAMICS:   VENTILATOR SETTINGS:   INTAKE / OUTPUT: Intake/Output      01/12 0701 - 01/13 0700 01/13 0701 - 01/14 0700   P.O. 100    I.V. (mL/kg) 1950 (46.5) 225 (5.4)   IV Piggyback 900    Total Intake(mL/kg) 2950 (70.4) 225 (5.4)   Urine (mL/kg/hr) 2830 (2.8) 135   Total Output 2830 135   Net +120 +90        Urine Occurrence 6 x 1 x   Stool Occurrence 2 x      PHYSICAL EXAMINATION: General:  Ill appearing, NAD,   Neuro:  moves all ext, more responsive 1/13 HEENT:  Proptosis B, OP moist Cardiovascular: Tachy, regular, no M Lungs:  Coarse rhonchi B, diffuse crackles Abdomen:   Tender to palp in lower quadrants  Musculoskeletal:  No edema Skin:  No rash   LABS: Cbc  Lab 10/12/12 0352 10/11/12 1752  WBC 14.4* --  HGB 12.6 13.8  HCT 36.9 39.7  PLT 222 312    Chemistry   Lab 10/12/12 0352 10/11/12 1752  NA 138 136  K 4.0 3.0*  CL 113* 104  CO2 11* 10*  BUN 22 27*  CREATININE 1.77* 2.16*  CALCIUM 6.7* 8.3*  MG -- --  PHOS -- --  GLUCOSE 88 61*    Liver fxn  Lab 10/12/12 0352 10/11/12 1752  AST 64* 76*  ALT 27 35  ALKPHOS 124* 167*  BILITOT 0.1* 0.2*  PROT 5.4* 7.2  ALBUMIN 2.1* 2.8*   coags No results found for this basename: APTT:3,INR:3 in the last 168 hours Sepsis markers  Lab 10/13/12 0345 10/12/12 1439 10/12/12 1437 10/11/12 1934  LATICACIDVEN -- 1.0 -- 1.5  PROCALCITON 4.83 -- 5.67 --   Cardiac markers No results found for this basename: CKTOTAL:3,CKMB:3,TROPONINI:3 in the last 168 hours BNP No results found for this basename: PROBNP:3 in the last 168 hours ABG No results found for this basename: PHART:3,PCO2ART:3,PO2ART:3,HCO3:3,TCO2:3 in the last 168 hours  CBG trend  Lab 10/13/12 0811 10/12/12 2155 10/12/12 1635 10/12/12 1248 10/12/12 0834  GLUCAP  107* 101* 82 71 75    IMAGING:  ECG:  DIAGNOSES: Principal Problem:  *Sepsis Active Problems:  DIABETES MELLITUS, TYPE II  ANXIETY  DEPRESSION  ASTHMA  Community acquired pneumonia  Bilateral lower abdominal discomfort  H1N1 influenza   ASSESSMENT / PLAN:  PULMONARY  ASSESSMENT: Acute resp failure due to H1N1 pneumonitis vs ARDS RUL CAP  PLAN:   - agree w  Tamiflu, for at least 10 days - consider d/c vanco, continue aztreonam + azithro - discussed severity of disease with the patient on 1/12. She wants to defer ETT/MV as long as possible. Will use BiPAP if she can tolerate. She has rebounded clinically on 1/13, may be able to avoid ETT - BD's as ordered - no real room to initiate diuresis given hemodynamics  CARDIOVASCULAR  ASSESSMENT:  Severe  sepsis, at risk for septic shock, currently hemodynamically stable except for sinus tachy Sinus tachycardia > likely due to her overall critical illness, but she also has hx narcotics use so consider withdrawal  PLAN:  - if she drops BP will place CVC and initiate EGDT; she has already received some volume resuscitation earlier in the hospitalization - hold off on volume for now given pulmonary infiltrates and probable ARDS  RENAL  ASSESSMENT:   Acute renal failure, due to sepsis, improving Metabolic acidosis, lactate reassuring 1/11 and 1/12 PLAN:   - follow S Cr and UOP  GASTROINTESTINAL  ASSESSMENT:  abd pain, nausea, likely due to viral process PLAN:   - antiemetics - consider KUB or CT scan abd if she decompensates  HEMATOLOGIC  ASSESSMENT:   PLAN:  - DVT prophylaxis  INFECTIOUS  ASSESSMENT:   RUL CAP on presentation CXR Influenza H1N1 positive Severe Sepsis  PLAN:   - abx coverage for severe CAP, see flowsheet; as above would consider d/c vanco in absence evidence for community acquired MRSA - tamiflu x 10 days+  - follow hemodynamics for evolving septic shock  ENDOCRINE  ASSESSMENT:  DM    PLAN:   - SSI protocol, convert to ICU protocol if she is intubated  NEUROLOGIC  ASSESSMENT:   Mild delirium; improved 1/13, suspect due to critical illness + narcotics PLAN:   - careful with sedating meds, narcotics  CLINICAL SUMMARY: 52 yo HTN, DM, Hep C. Admitted 1/11 with dyspnea, cough, fever. CXR with RUL PNA. Progressive dyspnea, tachycardia, pulmonary infiltrates > ARDS + Flu pneumonitis. CCM consulted 1/12. Still at high risk for ETT/MV. Patient requests a trial of BiPAP to try to avoid.   I have personally obtained a history, examined the patient, evaluated laboratory and imaging results, formulated the assessment and plan and placed orders.  CRITICAL CARE: The patient is critically ill with multiple organ systems failure and requires high complexity decision  making for assessment and support, frequent evaluation and titration of therapies, application of advanced monitoring technologies and extensive interpretation of multiple databases. Critical Care Time devoted to patient care services described in this note is 30 minutes.    Levy Pupa, MD, PhD 10/13/2012, 10:27 AM [facility] [phone] or if no answer [phone]

## 2012-10-13 NOTE — Progress Notes (Signed)
INITIAL NUTRITION ASSESSMENT  DOCUMENTATION CODES Per approved criteria  -Underweight   INTERVENTION: 1.  Modify diet; recommend diet liberalization to Regular.   2.  Supplements; Ensure Complete once daily.  NUTRITION DIAGNOSIS: Inadequate oral intake related to shortness of breath, weakness as evidenced by pt refusing meals.   Monitor:  1.  Food/Beverage; intake sufficient to meet >/=90% estimated needs.  Reason for Assessment: BMI  52 y.o. female  Admitting Dx: Sepsis  ASSESSMENT: Pt with h/o HTN, DM, and Hep C admitted with dyspnea, tachycardia.  Pt found to have H1N1 pneumonitis vs ARDS.  Currently on nasal cannual/bipap, but remains high risk for intubation. Note probable ARDS. Pt resting at time of visit, does not awaken to voice.  RD notes pt refused last meal. Wt appears overall stable and consistent with usual wt of 94 lbs.  Pt may be chronically underweight.  Nutrition assessment ongoing.    CBGs controlled. Lab Results  Component Value Date   HGBA1C 5.5 10/11/2012     Height: Ht Readings from Last 1 Encounters:  10/12/12 5\' 6"  (1.676 m)    Weight: Wt Readings from Last 1 Encounters:  10/12/12 92 lb 6 oz (41.9 kg)    Ideal Body Weight: 130 lbs  % Ideal Body Weight: 70%  Wt Readings from Last 10 Encounters:  10/12/12 92 lb 6 oz (41.9 kg)  07/30/11 94 lb 15.9 oz (43.09 kg)    Usual Body Weight: 94 lbs  % Usual Body Weight: 97%  BMI:  Body mass index is 14.91 kg/(m^2).  Estimated Nutritional Needs: Kcal: 1180-1350 Protein: 50-58g Fluid: >1.2 L/day  Skin: intact  Diet Order: Cardiac  EDUCATION NEEDS: -Education not appropriate at this time   Intake/Output Summary (Last 24 hours) at 10/13/12 1052 Last data filed at 10/13/12 1000  Gross per 24 hour  Intake   2150 ml  Output   2615 ml  Net   -465 ml    Last BM: 1/11  Labs:   Lab 10/12/12 0352 10/11/12 1752  NA 138 136  K 4.0 3.0*  CL 113* 104  CO2 11* 10*  BUN 22 27*    CREATININE 1.77* 2.16*  CALCIUM 6.7* 8.3*  MG -- --  PHOS -- --  GLUCOSE 88 61*    CBG (last 3)   Basename 10/13/12 0811 10/12/12 2155 10/12/12 1635  GLUCAP 107* 101* 82    Scheduled Meds:   . ALPRAZolam  1 mg Oral TID  . antiseptic oral rinse  15 mL Mouth Rinse BID  . azithromycin (ZITHROMAX) 500 MG IVPB  500 mg Intravenous Q24H  . brinzolamide  1 drop Both Eyes Daily  . cycloSPORINE  1 drop Both Eyes BID  . enoxaparin (LOVENOX) injection  30 mg Subcutaneous QHS  . insulin aspart  0-9 Units Subcutaneous TID WC  . oseltamivir  75 mg Oral BID  . sulfaSALAzine  1,000 mg Oral BID  . vancomycin  500 mg Intravenous Q48H    Continuous Infusions:   . sodium chloride 75 mL/hr at 10/12/12 1630    Past Medical History  Diagnosis Date  . Glaucoma   . Diabetes mellitus without complication   . Hypertension   . Hepatitis C   . Pancreatitis   . Neuropathy     History reviewed. No pertinent past surgical history.  Loyce Dys, MS RD LDN Clinical Inpatient Dietitian Pager: [phone] Weekend/After hours pager: [phone]

## 2012-10-13 NOTE — Progress Notes (Signed)
Hypokalemia, Hypomagnesemia and acidosis; the patient with Crohn's disease, diarrhea   K, Mg replaced  Started low dose HCO3

## 2012-10-13 NOTE — Progress Notes (Signed)
[id number]/Cathaleen Korol, RN, BSN, CCM: CHART REVIEWED AND UPDATED.  Next chart review due on [id number]. NO DISCHARGE NEEDS PRESENT AT THIS TIME. CASE MANAGEMENT [phone] 

## 2012-10-14 ENCOUNTER — Inpatient Hospital Stay (HOSPITAL_COMMUNITY): Payer: Medicare Other

## 2012-10-14 DIAGNOSIS — E872 Acidosis, unspecified: Secondary | ICD-10-CM

## 2012-10-14 LAB — BLOOD GAS, ARTERIAL
Acid-base deficit: 4.2 mmol/L — ABNORMAL HIGH (ref 0.0–2.0)
Bicarbonate: 20.2 mEq/L (ref 20.0–24.0)
Drawn by: 244901
Drawn by: 331471
Drawn by: 331471
Drawn by: 331471
FIO2: 0.5 %
MECHVT: 470 mL
O2 Saturation: 94.7 %
PEEP: 5 cmH2O
PEEP: 0.8 cmH2O
RATE: 14 resp/min
RATE: 24 resp/min
TCO2: 12.9 mmol/L (ref 0–100)
TCO2: 17.9 mmol/L (ref 0–100)
pCO2 arterial: 32.4 mmHg — ABNORMAL LOW (ref 35.0–45.0)
pCO2 arterial: 35.8 mmHg (ref 35.0–45.0)
pCO2 arterial: 33.3 mmHg — ABNORMAL LOW (ref 35.0–45.0)
pH, Arterial: 7.252 — ABNORMAL LOW (ref 7.350–7.450)
pH, Arterial: 7.254 — ABNORMAL LOW (ref 7.350–7.450)
pH, Arterial: 7.344 — ABNORMAL LOW (ref 7.350–7.450)
pO2, Arterial: 81.2 mmHg (ref 80.0–100.0)
pO2, Arterial: 99.3 mmHg (ref 80.0–100.0)

## 2012-10-14 LAB — CBC
HCT: 28.8 % — ABNORMAL LOW (ref 36.0–46.0)
Hemoglobin: 12.2 g/dL (ref 12.0–15.0)
MCH: 30 pg (ref 26.0–34.0)
MCV: 81.1 fL (ref 78.0–100.0)
Platelets: 167 10*3/uL (ref 150–400)
Platelets: 126 10*3/uL — ABNORMAL LOW (ref 150–400)
RBC: 4.06 MIL/uL (ref 3.87–5.11)
RBC: 3.55 MIL/uL — ABNORMAL LOW (ref 3.87–5.11)
WBC: 26.4 10*3/uL — ABNORMAL HIGH (ref 4.0–10.5)
WBC: 22.2 10*3/uL — ABNORMAL HIGH (ref 4.0–10.5)

## 2012-10-14 LAB — COMPREHENSIVE METABOLIC PANEL
ALT: 22 U/L (ref 0–35)
AST: 68 U/L — ABNORMAL HIGH (ref 0–37)
AST: 46 U/L — ABNORMAL HIGH (ref 0–37)
Alkaline Phosphatase: 112 U/L (ref 39–117)
BUN: 18 mg/dL (ref 6–23)
CO2: 14 mEq/L — ABNORMAL LOW (ref 19–32)
CO2: 22 mEq/L (ref 19–32)
Calcium: 7.2 mg/dL — ABNORMAL LOW (ref 8.4–10.5)
Chloride: 116 mEq/L — ABNORMAL HIGH (ref 96–112)
Chloride: 118 mEq/L — ABNORMAL HIGH (ref 96–112)
Creatinine, Ser: 1.42 mg/dL — ABNORMAL HIGH (ref 0.50–1.10)
Creatinine, Ser: 1.2 mg/dL — ABNORMAL HIGH (ref 0.50–1.10)
GFR calc Af Amer: 48 mL/min — ABNORMAL LOW (ref >90)
GFR calc non Af Amer: 41 mL/min — ABNORMAL LOW (ref >90)
GFR calc non Af Amer: 51 mL/min — ABNORMAL LOW (ref >90)
Potassium: 2.8 mEq/L — ABNORMAL LOW (ref 3.5–5.1)
Total Bilirubin: 0.3 mg/dL (ref 0.3–1.2)
Total Bilirubin: 0.4 mg/dL (ref 0.3–1.2)

## 2012-10-14 LAB — BASIC METABOLIC PANEL
BUN: 14 mg/dL (ref 6–23)
Calcium: 6.3 mg/dL — CL (ref 8.4–10.5)
Creatinine, Ser: 1.18 mg/dL — ABNORMAL HIGH (ref 0.50–1.10)
GFR calc Af Amer: 60 mL/min — ABNORMAL LOW (ref >90)
GFR calc non Af Amer: 52 mL/min — ABNORMAL LOW (ref >90)
Glucose, Bld: 179 mg/dL — ABNORMAL HIGH (ref 70–99)

## 2012-10-14 LAB — GLUCOSE, CAPILLARY
Glucose-Capillary: 134 mg/dL — ABNORMAL HIGH (ref 70–99)
Glucose-Capillary: 224 mg/dL — ABNORMAL HIGH (ref 70–99)
Glucose-Capillary: 22 mg/dL — CL (ref 70–99)
Glucose-Capillary: 70 mg/dL (ref 70–99)

## 2012-10-14 LAB — PROCALCITONIN: Procalcitonin: 3.6 ng/mL

## 2012-10-14 LAB — CULTURE, RESPIRATORY W GRAM STAIN: Culture: NORMAL

## 2012-10-14 LAB — CLOSTRIDIUM DIFFICILE BY PCR: Toxigenic C. Difficile by PCR: NEGATIVE

## 2012-10-14 LAB — TROPONIN I: Troponin I: <0.3 ng/mL (ref <0.30)

## 2012-10-14 MED ORDER — POTASSIUM CHLORIDE 10 MEQ/50ML IV SOLN
10.0000 meq | INTRAVENOUS | Status: AC
  Administered 2012-10-14 (×4): 10 meq via INTRAVENOUS
  Filled 2012-10-14 (×2): qty 50
  Filled 2012-10-14: qty 100

## 2012-10-14 MED ORDER — SODIUM CHLORIDE 0.9 % IV BOLUS (SEPSIS)
1000.0000 mL | Freq: Once | INTRAVENOUS | Status: AC
  Administered 2012-10-14: 1000 mL via INTRAVENOUS

## 2012-10-14 MED ORDER — SODIUM BICARBONATE 8.4 % IV SOLN
INTRAVENOUS | Status: DC
  Administered 2012-10-14 (×2): via INTRAVENOUS
  Filled 2012-10-14 (×5): qty 150

## 2012-10-14 MED ORDER — DEXTROSE 50 % IV SOLN
1.0000 | Freq: Once | INTRAVENOUS | Status: AC
  Administered 2012-10-14: 50 mL via INTRAVENOUS

## 2012-10-14 MED ORDER — FENTANYL BOLUS VIA INFUSION
25.0000 ug | Freq: Four times a day (QID) | INTRAVENOUS | Status: DC | PRN
  Filled 2012-10-14: qty 100

## 2012-10-14 MED ORDER — SODIUM BICARBONATE 8.4 % IV SOLN
INTRAVENOUS | Status: AC
  Filled 2012-10-14: qty 50

## 2012-10-14 MED ORDER — MIDAZOLAM HCL 5 MG/ML IJ SOLN
2.0000 mg | INTRAMUSCULAR | Status: DC | PRN
  Administered 2012-10-14: 1 mg via INTRAVENOUS

## 2012-10-14 MED ORDER — DEXTROSE 50 % IV SOLN
INTRAVENOUS | Status: AC
  Administered 2012-10-14: 50 mL via INTRAVENOUS
  Filled 2012-10-14: qty 100

## 2012-10-14 MED ORDER — ETOMIDATE 2 MG/ML IV SOLN
INTRAVENOUS | Status: AC
  Administered 2012-10-14: 20 mg
  Filled 2012-10-14: qty 20

## 2012-10-14 MED ORDER — DEXTROSE 50 % IV SOLN: 50.0000 mL | Freq: Once | INTRAVENOUS | Status: AC | PRN

## 2012-10-14 MED ORDER — MIDAZOLAM BOLUS VIA INFUSION
1.0000 mg | INTRAVENOUS | Status: DC | PRN
  Administered 2012-10-16: 2 mg via INTRAVENOUS
  Filled 2012-10-14: qty 2

## 2012-10-14 MED ORDER — PANTOPRAZOLE SODIUM 40 MG IV SOLR
40.0000 mg | INTRAVENOUS | Status: DC
  Administered 2012-10-14 – 2012-10-19 (×6): 40 mg via INTRAVENOUS
  Filled 2012-10-14 (×8): qty 40

## 2012-10-14 MED ORDER — OXEPA PO LIQD
1000.0000 mL | ORAL | Status: DC
  Administered 2012-10-14 – 2012-10-16 (×3): 1000 mL
  Filled 2012-10-14 (×6): qty 1000

## 2012-10-14 MED ORDER — JEVITY 1.2 CAL PO LIQD: 1000.0000 mL | ORAL | Status: DC

## 2012-10-14 MED ORDER — MAGNESIUM SULFATE 40 MG/ML IJ SOLN
2.0000 g | Freq: Once | INTRAMUSCULAR | Status: AC
  Administered 2012-10-14: 2 g via INTRAVENOUS
  Filled 2012-10-14: qty 50

## 2012-10-14 MED ORDER — FENTANYL CITRATE 0.05 MG/ML IJ SOLN
50.0000 ug | INTRAMUSCULAR | Status: DC | PRN
  Administered 2012-10-14: 100 ug via INTRAVENOUS

## 2012-10-14 MED ORDER — FENTANYL CITRATE 0.05 MG/ML IJ SOLN
INTRAMUSCULAR | Status: AC
  Administered 2012-10-14: 100 ug via INTRAVENOUS
  Filled 2012-10-14: qty 4

## 2012-10-14 MED ORDER — SUCCINYLCHOLINE CHLORIDE 20 MG/ML IJ SOLN
INTRAMUSCULAR | Status: AC
  Filled 2012-10-14: qty 1

## 2012-10-14 MED ORDER — DEXMEDETOMIDINE HCL IN NACL 200 MCG/50ML IV SOLN
0.2000 ug/kg/h | INTRAVENOUS | Status: DC
  Administered 2012-10-14: 0.7 ug/kg/h via INTRAVENOUS
  Administered 2012-10-14: 0.2 ug/kg/h via INTRAVENOUS
  Filled 2012-10-14 (×2): qty 50

## 2012-10-14 MED ORDER — METRONIDAZOLE IN NACL 5-0.79 MG/ML-% IV SOLN
500.0000 mg | Freq: Three times a day (TID) | INTRAVENOUS | Status: DC
  Filled 2012-10-14: qty 100

## 2012-10-14 MED ORDER — SODIUM CHLORIDE 0.9 % IV SOLN
1.0000 mg/h | INTRAVENOUS | Status: DC
  Administered 2012-10-14 – 2012-10-15 (×2): 2 mg/h via INTRAVENOUS
  Administered 2012-10-17 (×2): 3 mg/h via INTRAVENOUS
  Filled 2012-10-14 (×5): qty 10

## 2012-10-14 MED ORDER — ROCURONIUM BROMIDE 50 MG/5ML IV SOLN
INTRAVENOUS | Status: AC
  Administered 2012-10-14: 50 mg
  Filled 2012-10-14: qty 2

## 2012-10-14 MED ORDER — LIDOCAINE HCL (CARDIAC) 20 MG/ML IV SOLN
INTRAVENOUS | Status: AC
  Filled 2012-10-14: qty 5

## 2012-10-14 MED ORDER — MIDAZOLAM HCL 5 MG/ML IJ SOLN
INTRAMUSCULAR | Status: AC
  Administered 2012-10-14: 1 mg via INTRAVENOUS
  Filled 2012-10-14: qty 2

## 2012-10-14 MED ORDER — POTASSIUM CHLORIDE 10 MEQ/100ML IV SOLN
INTRAVENOUS | Status: AC
  Administered 2012-10-14: 10 meq
  Filled 2012-10-14: qty 100

## 2012-10-14 MED ORDER — SODIUM CHLORIDE 0.9 % IV SOLN
25.0000 ug/h | INTRAVENOUS | Status: DC
  Administered 2012-10-14: 50 ug/h via INTRAVENOUS
  Administered 2012-10-17: 125 ug/h via INTRAVENOUS
  Filled 2012-10-14 (×4): qty 50

## 2012-10-14 MED ORDER — DEXTROSE 5 % IV SOLN
1.0000 g | Freq: Three times a day (TID) | INTRAVENOUS | Status: DC
  Administered 2012-10-14 – 2012-10-18 (×12): 1 g via INTRAVENOUS
  Filled 2012-10-14 (×14): qty 1

## 2012-10-14 MED ORDER — SODIUM BICARBONATE 8.4 % IV SOLN
50.0000 meq | Freq: Once | INTRAVENOUS | Status: AC
  Administered 2012-10-14: 50 meq via INTRAVENOUS
  Filled 2012-10-14: qty 50

## 2012-10-14 MED ORDER — CHLORHEXIDINE GLUCONATE 0.12 % MT SOLN
OROMUCOSAL | Status: AC
  Filled 2012-10-14: qty 15

## 2012-10-14 NOTE — Progress Notes (Signed)
PULMONARY  / CRITICAL CARE MEDICINE  Name: [name] MRN: [id number] DOB: 1961-02-08    LOS: 3  REFERRING MD :  Cena Benton  CHIEF COMPLAINT:  Dyspnea, tachycardia  BRIEF PATIENT DESCRIPTION: 52 yo HTN, DM, Hep C. Admitted 1/11 with dyspnea, cough, fever. CXR with RUL PNA. Progressive dyspnea, tachycardia, pulmonary infiltrates. CCM consulted 1/12. Intubated for ARDS 1/14  LINES / TUBES: 1-14 et>> 1-14 cvl>> CULTURES: Influenza A 1/11 >> positive H1N1 1/11 >> positive Blood 1/11 >>  resp 1/11 >> normal flora Urine 1/11 >> negative  ANTIBIOTICS: Aztreonam 1/11 >> 1/13. Restarted 1/14>> azithro 1/11 >>  vanco 1/11 >>1/14  SIGNIFICANT EVENTS:  1-14 intubated LEVEL OF CARE:  SDU PRIMARY SERVICE:  [facility]:  PCCM CODE STATUS: Full  DIET:  Full diet DVT Px:  Enoxaparin (refuses), SCD's GI Px:  N/a   HISTORY OF PRESENT ILLNESS:  52 yo HTN, DM, Hep C. Admitted 1/11 with dyspnea, cough, fever. CXR with RUL PNA. Progressive dyspnea, tachycardia, pulmonary infiltrates. CCM consulted 1/12.   INTERVAL HISTORY:  1/14 in distress, poorly responsive. Required bicarb for progressive mixed acidosis overnight  VITAL SIGNS: Temp:  [93.2 F (34 C)-100 F (37.8 C)] 93.2 F (34 C) (01/14 0700) Pulse Rate:  [42-144] 135  (01/14 0700) Resp:  [27-43] 35  (01/14 0700) BP: (107-136)/(77-97) 117/86 mmHg (01/14 0700) SpO2:  [68 %-100 %] 90 % (01/14 0700) HEMODYNAMICS:   VENTILATOR SETTINGS:   INTAKE / OUTPUT: Intake/Output      01/13 0701 - 01/14 0700 01/14 0701 - 01/15 0700   P.O.     I.V. (mL/kg) 1341.5 (32)    IV Piggyback 750    Total Intake(mL/kg) 2091.5 (49.9)    Urine (mL/kg/hr) 2700 (2.7)    Stool 6    Total Output 2706    Net -614.5         Urine Occurrence 1 x      PHYSICAL EXAMINATION: General:  Ill appearing, in resp distress  Neuro:  moves all ext, arouses to stimuli HEENT:  , OP moist Cardiovascular: Tachy 140's, regular, no M Lungs:  Coarse rhonchi B,  diffuse crackles Abdomen:  Tender to palp in lower quadrants  Musculoskeletal:  No edema Skin:  No rash   LABS: Cbc  Lab 10/14/12 0305 10/13/12 2040 10/12/12 0352  WBC 26.4* -- --  HGB 12.2 14.2 12.6  HCT 33.7* 38.4 36.9  PLT 167 169 222    Chemistry   Lab 10/14/12 0305 10/13/12 2040 10/12/12 0352  NA 145 146* 138  K 3.6 2.9* 4.0  CL 116* 117* 113*  CO2 14* 10* 11*  BUN 18 22 22   CREATININE 1.42* 1.62* 1.77*  CALCIUM 7.2* 8.2* 6.7*  MG -- 1.1* --  PHOS -- -- --  GLUCOSE 228* 100* 88    Liver fxn  Lab 10/14/12 0305 10/13/12 2040 10/12/12 0352  AST 68* 70* 64*  ALT 24 26 27   ALKPHOS 123* 169* 124*  BILITOT 0.3 0.4 0.1*  PROT 5.4* 6.1 5.4*  ALBUMIN 1.7* 1.9* 2.1*   coags No results found for this basename: APTT:3,INR:3 in the last 168 hours Sepsis markers  Lab 10/14/12 0305 10/13/12 0345 10/12/12 1439 10/12/12 1437 10/11/12 1934  LATICACIDVEN -- -- 1.0 -- 1.5  PROCALCITON 3.60 4.83 -- 5.67 --   Cardiac markers  Lab 10/14/12 0305 10/13/12 2040  CKTOTAL -- --  CKMB -- --  TROPONINI <0.30 <0.30   BNP No results found for this basename: PROBNP:3 in  the last 168 hours ABG  Lab 10/14/12 0056  PHART 7.252*  PCO2ART 32.4*  PO2ART 53.6*  HCO3 13.7*  TCO2 12.9    CBG trend  Lab 10/14/12 0749 10/13/12 2229 10/13/12 2007 10/13/12 1755 10/13/12 1141  GLUCAP 224* 134* 84 60* 86    IMAGING: Dg Chest 2 View  10/12/2012  *RADIOLOGY REPORT*  Clinical Data: Shortness of breath, weakness, diabetes, pneumonia  CHEST - 2 VIEW  Comparison: 10/11/2012  Findings: Extensive new bilateral airspace process versus edema. Stable heart size.  No effusion or pneumothorax.  Trachea is midline.  Airspace disease is more pronounced in the upper lobes.  IMPRESSION: Worsening diffuse bilateral airspace process compared to yesterday could represent acute edema versus rapid progression of pneumonia.   Original Report Authenticated By: Judie Petit. Miles Costain, M.D.    Dg Chest Port 1  View  10/14/2012  *RADIOLOGY REPORT*  Clinical Data: Shortness of breath, confusion.  PORTABLE CHEST - 1 VIEW  Comparison:   the previous day's study  Findings: Moderate diffuse alveolar opacities throughout both lungs, largely stable.  Heart size upper limits normal.  No definite effusion.  Regional bones unremarkable.  IMPRESSION:  Little change in bilateral infiltrates or edema.   Original Report Authenticated By: D. Andria Rhein, MD    Dg Chest Port 1 View  10/13/2012  *RADIOLOGY REPORT*  Clinical Data: Follow up infiltrates  PORTABLE CHEST - 1 VIEW  Comparison: 10/12/2012  Findings: Severe diffuse bilateral airspace disease is present. This shows mild progression bilaterally.  No effusion.  Heart size is normal.  IMPRESSION: Mild progression of diffuse bilateral airspace disease .   Original Report Authenticated By: Janeece Riggers, M.D.     ECG:  DIAGNOSES: Principal Problem:  *Sepsis Active Problems:  DIABETES MELLITUS, TYPE II  ANXIETY  DEPRESSION  ASTHMA  Community acquired pneumonia  Bilateral lower abdominal discomfort  H1N1 influenza  Acute respiratory failure  ARDS (adult respiratory distress syndrome)  Crohn's disease   ASSESSMENT / PLAN:  PULMONARY  ASSESSMENT: Acute resp failure due to H1N1 pneumonitis vs ARDS RUL CAP  PLAN:   - agree w  Tamiflu, for at least 10 days - d/c vanco, continue aztreonam + azithro - discussed severity of disease with the patient on 1/12. Wanted to defer ETT/MV as long as possible but clearly will require 1/14. Will initiate ARDS protocol 1/14 - BD's as ordered - no real room to initiate diuresis given hemodynamics, will attempt to push CVP down if BP and renal fxn will allow  CARDIOVASCULAR  ASSESSMENT:  Severe sepsis, at risk for septic shock, currently hemodynamically stable except for sinus tachy Sinus tachycardia > likely due to her overall critical illness, but she also has hx narcotics use so consider withdrawal  PLAN:  -  place CVC 1/14 - hold off on volume for now given pulmonary infiltrates and probable ARDS  RENAL  ASSESSMENT:   Acute renal failure, due to sepsis, improving slowly Metabolic acidosis, lactate reassuring 1/11 and 1/12, likely due to renal failure Lab Results  Component Value Date   CREATININE 1.42* 10/14/2012   CREATININE 1.62* 10/13/2012   CREATININE 1.77* 10/12/2012    Intake/Output Summary (Last 24 hours) at 10/14/12 0812 Last data filed at 10/14/12 0700  Gross per 24 hour  Intake 2016.48 ml  Output   2631 ml  Net -614.52 ml   PLAN:   - follow BMP q12h for now, and UOP - d/c bicarb 1/14 and follow ABG  GASTROINTESTINAL  ASSESSMENT:  abd pain,  nausea, likely due to viral process PLAN:   - antiemetics - consider KUB or CT scan abd if she decompensates  HEMATOLOGIC  ASSESSMENT:   PLAN:  - DVT prophylaxis  INFECTIOUS  ASSESSMENT:   RUL CAP on presentation CXR Influenza H1N1 positive Severe Sepsis  PLAN:   - abx coverage for severe CAP, see flowsheet; as above, d/c vanco in absence evidence for community acquired MRSA - tamiflu x 10 days+  - follow hemodynamics for possible evolving septic shock  ENDOCRINE  ASSESSMENT:  DM    PLAN:   - SSI protocol, convert to ICU protocol if she is intubated  NEUROLOGIC  ASSESSMENT:   Mild delirium; improved 1/13, suspect due to critical illness + narcotics. Worse on 1/14. Agitated and confused or obtunded due to overall medical decline PLAN:   - careful with sedating meds, narcotics  CLINICAL SUMMARY: 52 yo HTN, DM, Hep C. Admitted 1/11 with dyspnea, cough, fever. CXR with RUL PNA. Progressive dyspnea, tachycardia, pulmonary infiltrates > ARDS + Flu pneumonitis. CCM consulted 1/12. 1/14 in overt resp distress requiring intubation.  Brett Canales Minor ACNP [facility] P[facility] Pager [phone] till 3 pm If no answer page [phone] 10/14/2012, 8:10 AM  CC time 60 minutes  Levy Pupa, MD, PhD 10/14/2012, 10:13 AM  [facility]ulmonary  [facility] Critical Care [phone] or if no answer [phone]

## 2012-10-14 NOTE — Progress Notes (Signed)
Blood tests reviewed   Cont current therapy. Repeat BMP in am

## 2012-10-14 NOTE — Progress Notes (Signed)
0740: First contact with patient, pt sat at 83 on Non-rebreather mask, appeared lethargic, and obtunded. Docia Barrier and Dr. Delton Coombes made awared. Will intubate this patient.   1610: Health care providers at bedside preparing for intubation [phone]: pt successfully intubated 0853: Central Line placed.

## 2012-10-14 NOTE — Progress Notes (Signed)
Son called, no answer, message left, no return call so far.

## 2012-10-14 NOTE — Progress Notes (Signed)
[facility] PROGRESS NOTE  [name] ZOX:[id number] DOB: 09/03/1961 DOA: 10/11/2012 PCP: No primary provider on file.  Assessment/Plan: 1. Sepsis:  - Pt on IV antibiotics Vanc, azithromycin, and aztreonam.  Given pulm's recommendations will change antibiotic regimen to aztreonam and azithromycin. - Patient on tamiflu given that she was influenza A/H1N1 positive - Patient required Normal saline fluid boluses overnight.  Blood pressure improved today.   - Patient still acute and as such given tachycardia and soft blood pressures would continue to monitor in stepdown.   - Patient with respiratory distress ?Etiology ARDs vs Fluid overload. Given hemodynamics (tachycardia and soft blood pressures) unable to diurese patient.  Agree with intubation given  - Reportedly patient has been having diarrhea.  C diff pcr negative reportedly. - Pulmonology on board and following.  2. Metabolic Acidosis - Patient has metabolic acidosis with respiratory compensation.  Normal Anion Gap.   - Given recent history of diarrhea this is most likely cause of bicarb - C diff pending - agree with intubation given current clinical scenario. - Appreciate Pulmonology's input at this juncture.  3. CAP - Patient is currently on IV antibiotic broad spectrum coverage with azithromycin and aztreonam.  Have discontinued vancomycin.  influenza A/ H1N1+. - follow temperature and WBC levels.  - Patient is influenza A positive and currently on tamiflu as indicated in pulmonology's notes would continue for atleast 10 days total. Started 10/11/12 - Strep and Legionella urine antigen negative.   - Currently in respiratory distress ?ARDs vs fluid overload, agree with intubation.  Respiratory therapy in room getting patient prepped for intubation.  4. Asthma - continue supplemental oxygen as needed - Nebulizers  5. Anxiety - While patient unable to take po medication due to intubation we may have to switch patient to IV  benzodiazepine.  6. DM - currently on sliding scale insuline  - continue to monitor CBG's  7. BL abdominal discomfort - Likely from UTI at this juncture - patient with supra pubic discomfort and cloudy urine with moderate leukocytes and rare bacteria and urine culture reporting multiple morphotypes none predominant.  Therefore will continue to monitor and continue current antibiotic regimen. - Patient reportedly having bouts of diarrhea. C diff results just called in and negative.  Code Status: presumed full code at this juncture Family Communication: No family at bedside Disposition Plan: Pending further improvement in clinical condition. Pt will remain in step down at this juncture.   Consultants:  Elink consult reportedly placed by admitting physician to Vanetta Mulders MD  Procedures:  none  Antibiotics:  Please see above  HPI/Subjective: Patient with increased work of breathing.  Does not respond to questions but opens eye and tries to gaze in my direction.  Objective: Filed Vitals:   10/14/12 0300 10/14/12 0500 10/14/12 0600 10/14/12 0700  BP: 123/97 121/82 121/83 117/86  Pulse: 141 136 141 135  Temp: 98.8 F (37.1 C) 99.7 F (37.6 C) 99.7 F (37.6 C) 93.2 F (34 C)  TempSrc:      Resp: 31 29 31  35  Height:      Weight:      SpO2: 91% 91% 87% 90%    Intake/Output Summary (Last 24 hours) at 10/14/12 0818 Last data filed at 10/14/12 0700  Gross per 24 hour  Intake 2016.48 ml  Output   2631 ml  Net -614.52 ml   Filed Weights   10/11/12 1700 10/12/12 0000  Weight: 45.36 kg (100 lb) 41.9 kg (92 lb 6 oz)  Exam:   General:  Pt laying supine in bed, does not interact with examiner.  Cardiovascular: S1 and S2 wnl, no murmurs  Respiratory: diffuse coarse breath sounds, breath sounds BL, increased work of breathing  Abdomen: soft, + suprapubic tenderness, ND  Data Reviewed: Basic Metabolic Panel:  Lab 10/14/12 4782 10/13/12 2040 10/12/12 0352 10/11/12  1752  NA 145 146* 138 136  K 3.6 2.9* 4.0 3.0*  CL 116* 117* 113* 104  CO2 14* 10* 11* 10*  GLUCOSE 228* 100* 88 61*  BUN 18 22 22  27*  CREATININE 1.42* 1.62* 1.77* 2.16*  CALCIUM 7.2* 8.2* 6.7* 8.3*  MG -- 1.1* -- --  PHOS -- -- -- --   Liver Function Tests:  Lab 10/14/12 0305 10/13/12 2040 10/12/12 0352 10/11/12 1752  AST 68* 70* 64* 76*  ALT 24 26 27  35  ALKPHOS 123* 169* 124* 167*  BILITOT 0.3 0.4 0.1* 0.2*  PROT 5.4* 6.1 5.4* 7.2  ALBUMIN 1.7* 1.9* 2.1* 2.8*    Lab 10/11/12 1752  LIPASE 11  AMYLASE --   No results found for this basename: AMMONIA:5 in the last 168 hours CBC:  Lab 10/14/12 0305 10/13/12 2040 10/12/12 0352 10/11/12 1752  WBC 26.4* 24.8* 14.4* 16.7*  NEUTROABS -- -- -- --  HGB 12.2 14.2 12.6 13.8  HCT 33.7* 38.4 36.9 39.7  MCV 83.0 81.9 87.4 86.1  PLT 167 169 222 312   Cardiac Enzymes:  Lab 10/14/12 0305 10/13/12 2040  CKTOTAL -- --  CKMB -- --  CKMBINDEX -- --  TROPONINI <0.30 <0.30   BNP (last 3 results) No results found for this basename: PROBNP:3 in the last 8760 hours CBG:  Lab 10/14/12 0749 10/13/12 2229 10/13/12 2007 10/13/12 1755 10/13/12 1141  GLUCAP 224* 134* 84 60* 86    Recent Results (from the past 240 hour(s))  CULTURE, BLOOD (ROUTINE X 2)     Status: Normal (Preliminary result)   Collection Time   10/11/12  7:34 PM      Component Value Range Status Comment   Specimen Description BLOOD LEFT ARM  5 ML IN [facility] BOTTLE   Final    Special Requests NONE   Final    Culture  Setup Time 10/11/2012 23:00   Final    Culture     Final    Value:        BLOOD CULTURE RECEIVED NO GROWTH TO DATE CULTURE WILL BE HELD FOR 5 DAYS BEFORE ISSUING A FINAL NEGATIVE REPORT   Report Status PENDING   Incomplete   CULTURE, BLOOD (ROUTINE X 2)     Status: Normal (Preliminary result)   Collection Time   10/11/12  7:45 PM      Component Value Range Status Comment   Specimen Description BLOOD LEFT HAND  3 ML IN AEROBIC ONLY   Final    Special  Requests NONE   Final    Culture  Setup Time 10/11/2012 22:59   Final    Culture     Final    Value:        BLOOD CULTURE RECEIVED NO GROWTH TO DATE CULTURE WILL BE HELD FOR 5 DAYS BEFORE ISSUING A FINAL NEGATIVE REPORT   Report Status PENDING   Incomplete   URINE CULTURE     Status: Normal   Collection Time   10/11/12  8:01 PM      Component Value Range Status Comment   Specimen Description URINE, CLEAN CATCH   Final  Special Requests NONE   Final    Culture  Setup Time 10/11/2012 23:03   Final    Colony Count 95,000 COLONIES/ML   Final    Culture     Final    Value: Multiple bacterial morphotypes present, none predominant. Suggest appropriate recollection if clinically indicated.   Report Status 10/12/2012 FINAL   Final   MRSA PCR SCREENING     Status: Normal   Collection Time   10/11/12 11:27 PM      Component Value Range Status Comment   MRSA by PCR NEGATIVE  NEGATIVE Final   CULTURE, EXPECTORATED SPUTUM-ASSESSMENT     Status: Normal   Collection Time   10/12/12  4:04 AM      Component Value Range Status Comment   Specimen Description SPUTUM   Final    Special Requests Normal   Final    Sputum evaluation     Final    Value: THIS SPECIMEN IS ACCEPTABLE. RESPIRATORY CULTURE REPORT TO FOLLOW.   Report Status 10/12/2012 FINAL   Final   CULTURE, RESPIRATORY     Status: Normal   Collection Time   10/12/12  4:04 AM      Component Value Range Status Comment   Specimen Description SPUTUM   Final    Special Requests NONE   Final    Gram Stain     Final    Value: FEW WBC PRESENT,BOTH PMN AND MONONUCLEAR     FEW SQUAMOUS EPITHELIAL CELLS PRESENT     RARE GRAM POSITIVE COCCI     IN PAIRS   Culture NORMAL OROPHARYNGEAL FLORA   Final    Report Status 10/14/2012 FINAL   Final      Studies: Dg Chest 2 View  10/12/2012  *RADIOLOGY REPORT*  Clinical Data: Shortness of breath, weakness, diabetes, pneumonia  CHEST - 2 VIEW  Comparison: 10/11/2012  Findings: Extensive new bilateral  airspace process versus edema. Stable heart size.  No effusion or pneumothorax.  Trachea is midline.  Airspace disease is more pronounced in the upper lobes.  IMPRESSION: Worsening diffuse bilateral airspace process compared to yesterday could represent acute edema versus rapid progression of pneumonia.   Original Report Authenticated By: Judie Petit. Miles Costain, M.D.    Dg Chest Port 1 View  10/14/2012  *RADIOLOGY REPORT*  Clinical Data: Shortness of breath, confusion.  PORTABLE CHEST - 1 VIEW  Comparison:   the previous day's study  Findings: Moderate diffuse alveolar opacities throughout both lungs, largely stable.  Heart size upper limits normal.  No definite effusion.  Regional bones unremarkable.  IMPRESSION:  Little change in bilateral infiltrates or edema.   Original Report Authenticated By: D. Andria Rhein, MD    Dg Chest Port 1 View  10/13/2012  *RADIOLOGY REPORT*  Clinical Data: Follow up infiltrates  PORTABLE CHEST - 1 VIEW  Comparison: 10/12/2012  Findings: Severe diffuse bilateral airspace disease is present. This shows mild progression bilaterally.  No effusion.  Heart size is normal.  IMPRESSION: Mild progression of diffuse bilateral airspace disease .   Original Report Authenticated By: Janeece Riggers, M.D.     Scheduled Meds:    . ALPRAZolam  1 mg Oral TID  . antiseptic oral rinse  15 mL Mouth Rinse BID  . azithromycin (ZITHROMAX) 500 MG IVPB  500 mg Intravenous Q24H  . brinzolamide  1 drop Both Eyes Daily  . chlorhexidine      . cycloSPORINE  1 drop Both Eyes BID  . enoxaparin (LOVENOX) injection  30 mg Subcutaneous QHS  . etomidate      . fentaNYL      . insulin aspart  0-9 Units Subcutaneous TID WC  . lidocaine (cardiac) 100 mg/11ml      . midazolam      . oseltamivir  75 mg Oral BID  . rocuronium      . succinylcholine      . sulfaSALAzine  1,000 mg Oral BID   Continuous Infusions:    . sodium chloride 20 mL/hr at 10/13/12 1058  . dexmedetomidine 0.7 mcg/kg/hr (10/14/12 4098)  .   sodium bicarbonate infusion 1000 mL 200 mL/hr at 10/14/12 1191    Principal Problem:  *Sepsis Active Problems:  DIABETES MELLITUS, TYPE II  ANXIETY  DEPRESSION  ASTHMA  Community acquired pneumonia  Bilateral lower abdominal discomfort  H1N1 influenza  Acute respiratory failure  ARDS (adult respiratory distress syndrome)  Crohn's disease    Time spent: > 35 minutes    Penny Pia  [facility] Pager [phone]. If 8PM-8AM, please contact night-coverage at [email or web address], password [facility] 10/14/2012, 8:18 AM  LOS: 3 days

## 2012-10-14 NOTE — Progress Notes (Addendum)
eLink Physician-Brief Progress Note Patient Name: [name] DOB: [birth year] MRN: [id number]  Date of Service  10/14/2012   HPI/Events of Note   RN calling due to agitation and confusion. On exam RASs +2 to +3. Not paradoxical breathing. RN reports continued diarrhea. Labs show nongap metabolic acidosis  HR 140. BP MAP 91  eICU Interventions  Stat abgI ncrease bicarb infusion rate 2amp at 50 -> 100/h Bicarb 1amp push 1L bolus precedex  Intubate if above fails   Addendum 2:05 AM Had bedside MD evaluate patient. He feels patient improved with above interventions. ADvised continued camera care   Intervention Category Intermediate Interventions: Deliriumn - evaluation and management;Arrhythmia  And Acid base- evaluation and management  [name] 10/14/2012, 12:56 AM

## 2012-10-14 NOTE — Progress Notes (Signed)
Two RT'S tried to obtain aline. RT'S unsuccessful so Brett Canales Minor NP placed a femoral aline. Pt had no adverse reactions to therapy.

## 2012-10-14 NOTE — Procedures (Signed)
Arterial Catheter Insertion Procedure Note MARGARUITE TOP [id number] 1960-10-13  Procedure: Insertion of Arterial Catheter  Indications: Blood pressure monitoring and Frequent blood sampling  Procedure Details Consent: Risks of procedure as well as the alternatives and risks of each were explained to the (patient/caregiver).  Consent for procedure obtained. Time Out: Verified patient identification, verified procedure, site/side was marked, verified correct patient position, special equipment/implants available, medications/allergies/relevent history reviewed, required imaging and test results available.  Performed  Maximum sterile technique was used including antiseptics, cap, gloves, gown, hand hygiene, mask and sheet. Skin prep: Chlorhexidine; local anesthetic administered 20 gauge catheter was inserted into right femoral artery using the Seldinger technique.  Evaluation Blood flow good; BP tracing good. Complications: No apparent complications.   Brett Canales Minor ACNP [facility] Pager [phone] till 3 pm If no answer page [phone] 10/14/2012, 11:01 AM   Levy Pupa, MD, PhD 10/14/2012, 11:04 AM [facility] [phone] or if no answer [phone]

## 2012-10-14 NOTE — Progress Notes (Addendum)
ANTIBIOTIC CONSULT NOTE  Pharmacy Consult for vancomycin/aztreonam Indication: pneumonia  Allergies  Allergen Reactions  . Iohexol Other (See Comments)    "severe burning"  . Penicillins Hives    Patient Measurements: Height: 5\' 6"  (167.6 cm) Weight: 92 lb 6 oz (41.9 kg) IBW/kg (Calculated) : 59.3  Adjusted Body Weight:   Vital Signs: Temp: 93.2 F (34 C) (01/14 0700) BP: 117/86 mmHg (01/14 0700) Pulse Rate: 135  (01/14 0700) Intake/Output from previous day: 01/13 0701 - 01/14 0700 In: 2091.5 [I.V.:1341.5; IV Piggyback:750] Out: 2706 [Urine:2700; Stool:6] Intake/Output from this shift:    Labs:  [facility] 10/14/12 0305 10/13/12 2040 10/12/12 0352  WBC 26.4* 24.8* 14.4*  HGB 12.2 14.2 12.6  PLT 167 169 222  LABCREA -- -- --  CREATININE 1.42* 1.62* 1.77*   Estimated Creatinine Clearance: 30.3 ml/min (by C-G formula based on Cr of 1.42). No results found for this basename: VANCOTROUGH:2,VANCOPEAK:2,VANCORANDOM:2,GENTTROUGH:2,GENTPEAK:2,GENTRANDOM:2,TOBRATROUGH:2,TOBRAPEAK:2,TOBRARND:2,AMIKACINPEAK:2,AMIKACINTROU:2,AMIKACIN:2, in the last 72 hours   Microbiology: Recent Results (from the past 720 hour(s))  CULTURE, BLOOD (ROUTINE X 2)     Status: Normal (Preliminary result)   Collection Time   10/11/12  7:34 PM      Component Value Range Status Comment   Specimen Description BLOOD LEFT ARM  5 ML IN [facility] BOTTLE   Final    Special Requests NONE   Final    Culture  Setup Time 10/11/2012 23:00   Final    Culture     Final    Value:        BLOOD CULTURE RECEIVED NO GROWTH TO DATE CULTURE WILL BE HELD FOR 5 DAYS BEFORE ISSUING A FINAL NEGATIVE REPORT   Report Status PENDING   Incomplete   CULTURE, BLOOD (ROUTINE X 2)     Status: Normal (Preliminary result)   Collection Time   10/11/12  7:45 PM      Component Value Range Status Comment   Specimen Description BLOOD LEFT HAND  3 ML IN AEROBIC ONLY   Final    Special Requests NONE   Final    Culture  Setup Time 10/11/2012  22:59   Final    Culture     Final    Value:        BLOOD CULTURE RECEIVED NO GROWTH TO DATE CULTURE WILL BE HELD FOR 5 DAYS BEFORE ISSUING A FINAL NEGATIVE REPORT   Report Status PENDING   Incomplete   URINE CULTURE     Status: Normal   Collection Time   10/11/12  8:01 PM      Component Value Range Status Comment   Specimen Description URINE, CLEAN CATCH   Final    Special Requests NONE   Final    Culture  Setup Time 10/11/2012 23:03   Final    Colony Count 95,000 COLONIES/ML   Final    Culture     Final    Value: Multiple bacterial morphotypes present, none predominant. Suggest appropriate recollection if clinically indicated.   Report Status 10/12/2012 FINAL   Final   MRSA PCR SCREENING     Status: Normal   Collection Time   10/11/12 11:27 PM      Component Value Range Status Comment   MRSA by PCR NEGATIVE  NEGATIVE Final   CULTURE, EXPECTORATED SPUTUM-ASSESSMENT     Status: Normal   Collection Time   10/12/12  4:04 AM      Component Value Range Status Comment   Specimen Description SPUTUM   Final    Special  Requests Normal   Final    Sputum evaluation     Final    Value: THIS SPECIMEN IS ACCEPTABLE. RESPIRATORY CULTURE REPORT TO FOLLOW.   Report Status 10/12/2012 FINAL   Final   CULTURE, RESPIRATORY     Status: Normal   Collection Time   10/12/12  4:04 AM      Component Value Range Status Comment   Specimen Description SPUTUM   Final    Special Requests NONE   Final    Gram Stain     Final    Value: FEW WBC PRESENT,BOTH PMN AND MONONUCLEAR     FEW SQUAMOUS EPITHELIAL CELLS PRESENT     RARE GRAM POSITIVE COCCI     IN PAIRS   Culture NORMAL OROPHARYNGEAL FLORA   Final    Report Status 10/14/2012 FINAL   Final   CLOSTRIDIUM DIFFICILE BY PCR     Status: Normal   Collection Time   10/14/12  2:41 AM      Component Value Range Status Comment   C difficile by pcr NEGATIVE  NEGATIVE Final     Medical History: Past Medical History  Diagnosis Date  . Glaucoma   . Diabetes  mellitus without complication   . Hypertension   . Hepatitis C   . Pancreatitis   . Neuropathy     Medications:  Anti-infectives     Start     Dose/Rate Route Frequency Ordered Stop   10/14/12 1000   aztreonam (AZACTAM) 1 g in dextrose 5 % 50 mL IVPB        1 g 100 mL/hr over 30 Minutes Intravenous 3 times per day 10/14/12 0823     10/13/12 2000   vancomycin (VANCOCIN) 500 mg in sodium chloride 0.9 % 100 mL IVPB  Status:  Discontinued        500 mg 100 mL/hr over 60 Minutes Intravenous Every 48 hours 10/12/12 0128 10/14/12 0758   10/11/12 2300   azithromycin (ZITHROMAX) 500 mg in dextrose 5 % 250 mL IVPB        500 mg 250 mL/hr over 60 Minutes Intravenous Every 24 hours 10/11/12 2257     10/11/12 2300   oseltamivir (TAMIFLU) capsule 75 mg        75 mg Oral 2 times daily 10/11/12 2257 10/16/12 2159   10/11/12 2200   aztreonam (AZACTAM) 1 g in dextrose 5 % 50 mL IVPB  Status:  Discontinued        1 g 100 mL/hr over 30 Minutes Intravenous 3 times per day 10/11/12 1959 10/13/12 0806   10/11/12 2000   vancomycin (VANCOCIN) IVPB 1000 mg/200 mL premix        1,000 mg 200 mL/hr over 60 Minutes Intravenous  Once 10/11/12 1959 10/11/12 2117   10/11/12 1945   levofloxacin (LEVAQUIN) IVPB 750 mg        750 mg 100 mL/hr over 90 Minutes Intravenous  Once 10/11/12 1935 10/11/12 2207         Assessment: 51 YOF PNA, 3 wk hx of productive cough, intermittent fever. Hx of DM, Crohns, HTN, HepC, pancreatitis.  1/11 >> vanc >> 1/14 1/11 >> aztreo >> 1/13, resumed 1/14 >> 1/11>> Azithromycin >> 1/11>>Tamiflu (x 5days)>> 1/14 >> metronidazole  Tmax: 99.7 WBCs:16.7-> 26.4 Renal: SCr 1.42, Normalized CrCl= 52 PCT: 5.67 -> 4.83  1/11 blood: NGTD 1/11 urine: 95K multiple morph 1/12 sputum: Normal flora 1/11 influenza panel: positive  Goal of Therapy:  Vancomycin trough level  15-20 mcg/ml  Plan:   Orders to stop vancomycin, start flagyl, resume aztreonam 1gm IV q8h and continue  azithromycin/tamiflu.    Alternatively, could use levofloxacin in place of aztreonam and azithromycin to cover atypical and gram negatives (cont flagyl). Dannielle Huh 10/14/2012,8:23 AM

## 2012-10-14 NOTE — Progress Notes (Signed)
Rt retracted airway tube from 23@lip  to 21@lip  per MD order. Chest x-ray was obtained.

## 2012-10-14 NOTE — Progress Notes (Signed)
Hypokalemia and hypomagnesemia   K and Mg replaced  

## 2012-10-14 NOTE — Procedures (Signed)
Intubation Procedure Note [name] [id number] 12-22-1960  Procedure: Intubation Indications: Respiratory insufficiency  Procedure Details Consent: Risks of procedure as well as the alternatives and risks of each were explained to the (patient/caregiver).  Consent for procedure obtained. Time Out: Verified patient identification, verified procedure, site/side was marked, verified correct patient position, special equipment/implants available, medications/allergies/relevent history reviewed, required imaging and test results available.  Performed  3 Medications:  Fentanyl 100 mcg Etomidate 20 mg Versed 1 mg NMB 50 mg   Evaluation Hemodynamic Status: BP stable throughout; O2 sats: stable throughout Patient's Current Condition: stable Complications: No apparent complications Patient did tolerate procedure well. Chest X-ray ordered to verify placement.  CXR: pending.   Brett Canales Minor ACNP [facility] Pager [phone] till 3 pm If no answer page [phone] 10/14/2012, 9:01 AM   Levy Pupa, MD, PhD 10/14/2012, 10:19 AM [facility] [phone] or if no answer [phone]

## 2012-10-14 NOTE — Progress Notes (Signed)
NUTRITION FOLLOW UP  Intervention:   1.  Enteral nutrition; initiate Oxepa @ 10 mL/hr continuous.  Advance by 10 mL q 6 hrs to 40 mL/hr goal to provide 1440 kcal, 60g protein, and 753 mL free water.  Nutrition Dx:   Inadequate oral intake, ongoing  Monitor:  1. Food/Beverage; intake sufficient to meet >/=90% estimated needs.  No longer appropriate, pt intubated 2.  Enteral nutrition; initiation with tolerance.  Pt meeting >/=90% estimated needs. 3.  Wt/wt change; monitor trends.   Assessment:   Pt with h/o HTN, DM, and Hep C admitted with dyspnea, tachycardia. Pt found to have H1N1 pneumonitis vs ARDS. Patient is currently intubated on ventilator support.  MV: 11.6 L/min Temp:Temp (24hrs), Avg:98.8 F (37.1 C), Min:93.2 F (34 C), Max:100 F (37.8 C)  Discussed with RN, NP has ordered TFs for pt.   Height: Ht Readings from Last 1 Encounters:  10/12/12 5\' 6"  (1.676 m)    Weight Status:   Wt Readings from Last 1 Encounters:  10/12/12 92 lb 6 oz (41.9 kg)    Re-estimated needs:  Kcal: 1445 Protein: 54-62 Fluid: ~1.5 L/day  Skin: Stage 2  Diet Order:     Intake/Output Summary (Last 24 hours) at 10/14/12 1128 Last data filed at 10/14/12 1000  Gross per 24 hour  Intake 1906.48 ml  Output   3071 ml  Net -1164.52 ml    Last BM: 1/14  Labs:   Lab 10/14/12 0305 10/13/12 2040 10/12/12 0352  NA 145 146* 138  K 3.6 2.9* 4.0  CL 116* 117* 113*  CO2 14* 10* 11*  BUN 18 22 22   CREATININE 1.42* 1.62* 1.77*  CALCIUM 7.2* 8.2* 6.7*  MG -- 1.1* --  PHOS -- -- --  GLUCOSE 228* 100* 88    CBG (last 3)   Basename 10/14/12 0749 10/13/12 2229 10/13/12 2007  GLUCAP 224* 134* 84    Scheduled Meds:   . antiseptic oral rinse  15 mL Mouth Rinse BID  . azithromycin (ZITHROMAX) 500 MG IVPB  500 mg Intravenous Q24H  . aztreonam  1 g Intravenous Q8H  . brinzolamide  1 drop Both Eyes Daily  . chlorhexidine      . cycloSPORINE  1 drop Both Eyes BID  . enoxaparin  (LOVENOX) injection  30 mg Subcutaneous QHS  . feeding supplement (JEVITY 1.2 CAL)  1,000 mL Per Tube Q24H  . insulin aspart  0-9 Units Subcutaneous TID WC  . lidocaine (cardiac) 100 mg/43ml      . oseltamivir  75 mg Oral BID  . succinylcholine      . sulfaSALAzine  1,000 mg Oral BID    Continuous Infusions:   . sodium chloride 20 mL/hr at 10/13/12 1058  . fentaNYL infusion INTRAVENOUS    . midazolam (VERSED) infusion      Loyce Dys, MS RD LDN Clinical Inpatient Dietitian Pager: [phone] Weekend/After hours pager: [phone]

## 2012-10-14 NOTE — Progress Notes (Signed)
eLink Physician-Brief Progress Note Patient Name: [name] DOB: [birth year] MRN: [id number]  Date of Service  10/14/2012   HPI/Events of Note  HR 145 sinus  eICU Interventions  1L fluid bolus   Intervention Category Intermediate Interventions: Other:  Arwin Bisceglia 10/14/2012, 4:05 AM

## 2012-10-14 NOTE — Procedures (Signed)
Central Venous Catheter Insertion Procedure Note [name] [id number] [birth year]  Procedure: Insertion of Central Venous Catheter Indications: Assessment of intravascular volume, Drug and/or fluid administration and Frequent blood sampling  Procedure Details Consent: Risks of procedure as well as the alternatives and risks of each were explained to the (patient/caregiver).  Consent for procedure obtained. Time Out: Verified patient identification, verified procedure, site/side was marked, verified correct patient position, special equipment/implants available, medications/allergies/relevent history reviewed, required imaging and test results available.  Performed  Maximum sterile technique was used including antiseptics, cap, gloves, gown, hand hygiene, mask and sheet. Skin prep: Chlorhexidine; local anesthetic administered A antimicrobial bonded/coated triple lumen catheter was placed in the left internal jugular vein using the Seldinger technique. Ultrasound guidance used.yes Catheter placed to 20 cm. Blood aspirated via all 3 ports and then flushed x 3. Line sutured x 2 and dressing applied.  Evaluation Blood flow good Complications: No apparent complications Patient did tolerate procedure well. Chest X-ray ordered to verify placement.  CXR: pending.  Brett Canales Minor ACNP [facility] Pager [phone] till 3 pm If no answer page [phone] 10/14/2012, 8:59 AM   Levy Pupa, MD, PhD 10/14/2012, 10:19 AM [facility] [phone] or if no answer [phone]

## 2012-10-14 NOTE — Progress Notes (Signed)
SUP ordered  

## 2012-10-15 ENCOUNTER — Inpatient Hospital Stay (HOSPITAL_COMMUNITY): Payer: Medicare Other

## 2012-10-15 DIAGNOSIS — E876 Hypokalemia: Secondary | ICD-10-CM

## 2012-10-15 DIAGNOSIS — J9600 Acute respiratory failure, unspecified whether with hypoxia or hypercapnia: Secondary | ICD-10-CM

## 2012-10-15 DIAGNOSIS — J101 Influenza due to other identified influenza virus with other respiratory manifestations: Secondary | ICD-10-CM

## 2012-10-15 DIAGNOSIS — J189 Pneumonia, unspecified organism: Secondary | ICD-10-CM

## 2012-10-15 DIAGNOSIS — J9589 Other postprocedural complications and disorders of respiratory system, not elsewhere classified: Secondary | ICD-10-CM

## 2012-10-15 DIAGNOSIS — D649 Anemia, unspecified: Secondary | ICD-10-CM

## 2012-10-15 DIAGNOSIS — R791 Abnormal coagulation profile: Secondary | ICD-10-CM

## 2012-10-15 DIAGNOSIS — R6520 Severe sepsis without septic shock: Secondary | ICD-10-CM

## 2012-10-15 DIAGNOSIS — E870 Hyperosmolality and hypernatremia: Secondary | ICD-10-CM

## 2012-10-15 DIAGNOSIS — D696 Thrombocytopenia, unspecified: Secondary | ICD-10-CM

## 2012-10-15 DIAGNOSIS — K7460 Unspecified cirrhosis of liver: Secondary | ICD-10-CM

## 2012-10-15 DIAGNOSIS — A419 Sepsis, unspecified organism: Principal | ICD-10-CM

## 2012-10-15 DIAGNOSIS — D72829 Elevated white blood cell count, unspecified: Secondary | ICD-10-CM

## 2012-10-15 DIAGNOSIS — J9690 Respiratory failure, unspecified, unspecified whether with hypoxia or hypercapnia: Secondary | ICD-10-CM

## 2012-10-15 LAB — CBC
HCT: 24.5 % — ABNORMAL LOW (ref 36.0–46.0)
MCH: 29.8 pg (ref 26.0–34.0)
MCHC: 36.7 g/dL — ABNORMAL HIGH (ref 30.0–36.0)
MCV: 81.1 fL (ref 78.0–100.0)
Platelets: 101 10*3/uL — ABNORMAL LOW (ref 150–400)
RDW: 14.9 % (ref 11.5–15.5)
WBC: 21.6 10*3/uL — ABNORMAL HIGH (ref 4.0–10.5)

## 2012-10-15 LAB — CALCIUM, IONIZED: Calcium, Ion: 1.03 mmol/L — ABNORMAL LOW (ref 1.12–1.32)

## 2012-10-15 LAB — BASIC METABOLIC PANEL
BUN: 12 mg/dL (ref 6–23)
Calcium: 6.4 mg/dL — CL (ref 8.4–10.5)
Creatinine, Ser: 1.15 mg/dL — ABNORMAL HIGH (ref 0.50–1.10)
GFR calc Af Amer: 62 mL/min — ABNORMAL LOW (ref >90)
GFR calc non Af Amer: 53 mL/min — ABNORMAL LOW (ref >90)

## 2012-10-15 LAB — GLUCOSE, CAPILLARY
Glucose-Capillary: 107 mg/dL — ABNORMAL HIGH (ref 70–99)
Glucose-Capillary: 147 mg/dL — ABNORMAL HIGH (ref 70–99)
Glucose-Capillary: 153 mg/dL — ABNORMAL HIGH (ref 70–99)
Glucose-Capillary: 160 mg/dL — ABNORMAL HIGH (ref 70–99)

## 2012-10-15 LAB — RENAL FUNCTION PANEL
CO2: 16 mEq/L — ABNORMAL LOW (ref 19–32)
Chloride: 120 mEq/L — ABNORMAL HIGH (ref 96–112)
GFR calc Af Amer: 58 mL/min — ABNORMAL LOW (ref >90)
GFR calc non Af Amer: 50 mL/min — ABNORMAL LOW (ref >90)
Glucose, Bld: 195 mg/dL — ABNORMAL HIGH (ref 70–99)
Potassium: 4.8 mEq/L (ref 3.5–5.1)
Sodium: 147 mEq/L — ABNORMAL HIGH (ref 135–145)

## 2012-10-15 LAB — DIC (DISSEMINATED INTRAVASCULAR COAGULATION)PANEL
INR: 1.62 — ABNORMAL HIGH (ref 0.00–1.49)
Prothrombin Time: 18.7 seconds — ABNORMAL HIGH (ref 11.6–15.2)
aPTT: 50 seconds — ABNORMAL HIGH (ref 24–37)

## 2012-10-15 LAB — MAGNESIUM: Magnesium: 2.1 mg/dL (ref 1.5–2.5)

## 2012-10-15 LAB — PHOSPHORUS: Phosphorus: 1.1 mg/dL — ABNORMAL LOW (ref 2.3–4.6)

## 2012-10-15 MED ORDER — POTASSIUM PHOSPHATE DIBASIC 3 MMOLE/ML IV SOLN
24.0000 mmol | Freq: Once | INTRAVENOUS | Status: AC
  Administered 2012-10-15: 24 mmol via INTRAVENOUS
  Filled 2012-10-15: qty 8

## 2012-10-15 MED ORDER — POTASSIUM CHLORIDE 20 MEQ/15ML (10%) PO LIQD
40.0000 meq | Freq: Once | ORAL | Status: AC
  Administered 2012-10-15: 40 meq
  Filled 2012-10-15: qty 30

## 2012-10-15 MED ORDER — SODIUM CHLORIDE 0.9 % IV BOLUS (SEPSIS)
2000.0000 mL | Freq: Once | INTRAVENOUS | Status: AC
  Administered 2012-10-15: 2000 mL via INTRAVENOUS

## 2012-10-15 MED ORDER — MAGNESIUM SULFATE 40 MG/ML IJ SOLN
2.0000 g | Freq: Once | INTRAMUSCULAR | Status: AC
  Administered 2012-10-15: 2 g via INTRAVENOUS
  Filled 2012-10-15: qty 50

## 2012-10-15 MED ORDER — CHLORHEXIDINE GLUCONATE 0.12 % MT SOLN
15.0000 mL | Freq: Two times a day (BID) | OROMUCOSAL | Status: DC
  Administered 2012-10-15 – 2012-10-19 (×8): 15 mL via OROMUCOSAL
  Filled 2012-10-15 (×7): qty 15

## 2012-10-15 MED ORDER — BIOTENE DRY MOUTH MT LIQD
15.0000 mL | Freq: Four times a day (QID) | OROMUCOSAL | Status: DC
  Administered 2012-10-16 – 2012-10-22 (×19): 15 mL via OROMUCOSAL

## 2012-10-15 MED ORDER — INSULIN ASPART 100 UNIT/ML ~~LOC~~ SOLN: 2.0000 [IU] | SUBCUTANEOUS | Status: DC

## 2012-10-15 MED ORDER — MAGNESIUM SULFATE 50 % IJ SOLN: 2.0000 g | Freq: Once | INTRAVENOUS | Status: DC

## 2012-10-15 MED ORDER — NOREPINEPHRINE BITARTRATE 1 MG/ML IJ SOLN
2.0000 ug/min | INTRAMUSCULAR | Status: DC
  Administered 2012-10-15: 10 ug/min via INTRAVENOUS
  Filled 2012-10-15 (×2): qty 4

## 2012-10-15 MED ORDER — INSULIN ASPART 100 UNIT/ML ~~LOC~~ SOLN
0.0000 [IU] | SUBCUTANEOUS | Status: DC
  Administered 2012-10-15: 1 [IU] via SUBCUTANEOUS
  Administered 2012-10-15: 2 [IU] via SUBCUTANEOUS
  Administered 2012-10-15: 1 [IU] via SUBCUTANEOUS
  Administered 2012-10-16: 2 [IU] via SUBCUTANEOUS

## 2012-10-15 NOTE — Progress Notes (Signed)
[facility] PROGRESS NOTE  LURLEAN KERNEN ZOX:[id number] DOB: 06-09-1961 DOA: 10/11/2012 PCP: No primary provider on file.  Assessment/Plan: 1. Septic shock, currently fluid responsive.  CVP was 6 overnight prior to boluses, then 12 after 2L bolus.  MAPS rose slightly.  Likely due to flu and possible ARDS -  Continue azithromycin, and aztreonam and tamiflu   -  Patient still acute and as such given tachycardia and soft blood pressures would continue to monitor in stepdown.   -  Appreciate PCCM assistance  CAP/flu and asthma with acute hypoxic respiratory failure, intubated 1/14.  ETT in good position on AM CXR.   -  Ventilator and sedation management per PCCM -  Continue azithromycin and aztreonam and tamiflu.   -  Strep and Legionella urine antigen negative.    Hypernatremia, likely due to poor oral intake of free water. -  Recheck BMP post electrolyte repletion and consider slow infusion of D5W vs. OGT boluses of free water Hypokalemia, likely due to acute illness and inadequate maintenance potassium in IVF -  Repleted per PCCM this AM Hypocalcemia, corrects to near normal with low albumin levels - will add ionized calcium to next set of labs Hypomagnesemia, likely due to acute illness and inadequate maintenance supplementation -  IV replacement per PCCM Hypophosphotemia, likely due to acute illness and inadequate maintenance supplementatino -  IV and PO replacement [place] metabolic Acidosis, likely due to diarrhea vs. RTA in the setting of hypotension.  Lactic acid only mildly elevated yesterday - C diff neg  Leukocytosis, due to pneumonia and sepsis -  Stable vs. Trending down slightly Normocytic anemia, trending down slightly.  May be acute marrow suppression from sepsis.  No evidence of bleeding -  Transfuse for hgb < 7 Thrombocytopenia, trending down slowly.  Most likely due to marrow suppression and acute illness/pneumonia -  DIC panel  Elevated INR -  DIC panel -   Consider vitamin K  Anxiety, sedation per PCCM - fentanyl and versed gtt  6. DM, fingersticks stable to low.   - decrease sliding scale - continue to monitor CBG's  7. BL abdominal discomfort, resolved.  Patient comfortable during exam -   May have been due to diarrhea  Diarrhea:   - C diff negative. -  Stool culture pending  DIET:  Feeding supplement at 106ml/h continuous ACCESS:  Left IJ TLC, right femoral a-line IVF:  None PROPH:  lovenox  Code Status: presumed full code at this juncture Family Communication: No family at bedside Disposition Plan: Pending further improvement in clinical condition.  Requires ICU level of care.   Consultants: PCCM  Procedures:  Intubation 1/14  Antibiotics:  Vanc  Aztreonam  Azithromycin  Tamiflu 10/11/12  HPI/Subjective: Patient comfortable on ventilator.  Moves extremities to voice.   Objective: Filed Vitals:   10/15/12 0400 10/15/12 0426 10/15/12 0500 10/15/12 0600  BP: 97/71  89/60 87/62  Pulse: 131 136 132 132  Temp: 100.2 F (37.9 C) 100.4 F (38 C) 100.8 F (38.2 C) 100.8 F (38.2 C)  TempSrc:      Resp: 31 34 32 32  Height:      Weight:      SpO2: 90% 91% 94% 95%    Intake/Output Summary (Last 24 hours) at 10/15/12 0710 Last data filed at 10/15/12 0525  Gross per 24 hour  Intake 3432.87 ml  Output   2261 ml  Net 1171.87 ml   Filed Weights   10/11/12 1700 10/12/12 0000 10/15/12 0000  Weight: 45.36 kg (100 lb) 41.9 kg (92 lb 6 oz) 42 kg (92 lb 9.5 oz)    Exam:   General:  Intubated and sedated.    HEENT:  ETT and OGT in place  Neck:  Left IJ TLC   Cardiovascular:  Tachycardic, regular rhythm, S1 and S2 wnl, no murmurs  Respiratory: diffuse coarse breath sounds, comfortable on ventilator with no increased WOB  Abdomen: soft, ND, NT, no organomegaly  MSK:  Trace extremity edema diffusely  Data Reviewed: Basic Metabolic Panel:  Lab 10/15/12 0981 10/14/12 1805 10/14/12 1630 10/14/12 0305  10/13/12 2040  NA 149* 148* 151* 145 146*  K 2.8* 2.4* 2.8* 3.6 2.9*  CL 122* 117* 118* 116* 117*  CO2 19 21 22  14* 10*  GLUCOSE 104* 179* 22* 228* 100*  BUN 12 14 15 18 22   CREATININE 1.15* 1.18* 1.20* 1.42* 1.62*  CALCIUM 6.4* 6.3* 6.8* 7.2* 8.2*  MG 1.6 -- 1.2* -- 1.1*  PHOS 1.1* -- -- -- --   Liver Function Tests:  Lab 10/14/12 1630 10/14/12 0305 10/13/12 2040 10/12/12 0352 10/11/12 1752  AST 46* 68* 70* 64* 76*  ALT 22 24 26 27  35  ALKPHOS 112 123* 169* 124* 167*  BILITOT 0.4 0.3 0.4 0.1* 0.2*  PROT 4.7* 5.4* 6.1 5.4* 7.2  ALBUMIN 1.5* 1.7* 1.9* 2.1* 2.8*    Lab 10/11/12 1752  LIPASE 11  AMYLASE --   No results found for this basename: AMMONIA:5 in the last 168 hours CBC:  Lab 10/15/12 0500 10/14/12 1630 10/14/12 0305 10/13/12 2040 10/12/12 0352  WBC 21.6* 22.2* 26.4* 24.8* 14.4*  NEUTROABS -- -- -- -- --  HGB 9.0* 10.4* 12.2 14.2 12.6  HCT 24.5* 28.8* 33.7* 38.4 36.9  MCV 81.1 81.1 83.0 81.9 87.4  PLT 101* 126* 167 169 222   Cardiac Enzymes:  Lab 10/14/12 0914 10/14/12 0305 10/13/12 2040  CKTOTAL -- -- --  CKMB -- -- --  CKMBINDEX -- -- --  TROPONINI <0.30 <0.30 <0.30   BNP (last 3 results) No results found for this basename: PROBNP:3 in the last 8760 hours CBG:  Lab 10/14/12 2138 10/14/12 1809 10/14/12 1739 10/14/12 1738 10/14/12 1211  GLUCAP 70 139* 22* 19* 159*    Recent Results (from the past 240 hour(s))  CULTURE, BLOOD (ROUTINE X 2)     Status: Normal (Preliminary result)   Collection Time   10/11/12  7:34 PM      Component Value Range Status Comment   Specimen Description BLOOD LEFT ARM  5 ML IN [facility] BOTTLE   Final    Special Requests NONE   Final    Culture  Setup Time 10/11/2012 23:00   Final    Culture     Final    Value:        BLOOD CULTURE RECEIVED NO GROWTH TO DATE CULTURE WILL BE HELD FOR 5 DAYS BEFORE ISSUING A FINAL NEGATIVE REPORT   Report Status PENDING   Incomplete   CULTURE, BLOOD (ROUTINE X 2)     Status: Normal (Preliminary  result)   Collection Time   10/11/12  7:45 PM      Component Value Range Status Comment   Specimen Description BLOOD LEFT HAND  3 ML IN AEROBIC ONLY   Final    Special Requests NONE   Final    Culture  Setup Time 10/11/2012 22:59   Final    Culture     Final    Value:  BLOOD CULTURE RECEIVED NO GROWTH TO DATE CULTURE WILL BE HELD FOR 5 DAYS BEFORE ISSUING A FINAL NEGATIVE REPORT   Report Status PENDING   Incomplete   URINE CULTURE     Status: Normal   Collection Time   10/11/12  8:01 PM      Component Value Range Status Comment   Specimen Description URINE, CLEAN CATCH   Final    Special Requests NONE   Final    Culture  Setup Time 10/11/2012 23:03   Final    Colony Count 95,000 COLONIES/ML   Final    Culture     Final    Value: Multiple bacterial morphotypes present, none predominant. Suggest appropriate recollection if clinically indicated.   Report Status 10/12/2012 FINAL   Final   MRSA PCR SCREENING     Status: Normal   Collection Time   10/11/12 11:27 PM      Component Value Range Status Comment   MRSA by PCR NEGATIVE  NEGATIVE Final   CULTURE, EXPECTORATED SPUTUM-ASSESSMENT     Status: Normal   Collection Time   10/12/12  4:04 AM      Component Value Range Status Comment   Specimen Description SPUTUM   Final    Special Requests Normal   Final    Sputum evaluation     Final    Value: THIS SPECIMEN IS ACCEPTABLE. RESPIRATORY CULTURE REPORT TO FOLLOW.   Report Status 10/12/2012 FINAL   Final   CULTURE, RESPIRATORY     Status: Normal   Collection Time   10/12/12  4:04 AM      Component Value Range Status Comment   Specimen Description SPUTUM   Final    Special Requests NONE   Final    Gram Stain     Final    Value: FEW WBC PRESENT,BOTH PMN AND MONONUCLEAR     FEW SQUAMOUS EPITHELIAL CELLS PRESENT     RARE GRAM POSITIVE COCCI     IN PAIRS   Culture NORMAL OROPHARYNGEAL FLORA   Final    Report Status 10/14/2012 FINAL   Final   CLOSTRIDIUM DIFFICILE BY PCR     Status:  Normal   Collection Time   10/14/12  2:41 AM      Component Value Range Status Comment   C difficile by pcr NEGATIVE  NEGATIVE Final      Studies: Dg Chest 1 View  10/14/2012  *RADIOLOGY REPORT*  Clinical Data: ETT, central line  CHEST - 1 VIEW  Comparison: 10/14/2012 at 0118 hours  Findings: Endotracheal tube terminates 5 mm above the carina and preferentially to dates the right mainstem bronchus.  Withdrawal approximately 2-3 cm is suggested.  Left IJ venous catheter terminates at the cavoatrial junction. Enteric tube courses into the stomach.  Stable diffuse coarse interstitial/airspace opacities. No pleural effusion or pneumothorax.  The heart is normal in size.  IMPRESSION: Endotracheal tube terminates 5 mm above the carina.  Withdrawal approximately 2-3 cm is suggested.  Left IJ venous catheter terminates at the cavoatrial junction. No pneumothorax.  Stable diffuse coarse interstitial/airspace opacities, possibly reflecting multifocal pneumonia or interstitial edema.   Original Report Authenticated By: Charline Bills, M.D.    Dg Chest Port 1 View  10/14/2012  *RADIOLOGY REPORT*  Clinical Data: Intubation  PORTABLE CHEST - 1 VIEW  Comparison: 10/14/2012  Findings: Endotracheal tube 3.7 cm above the carina.  Left IJ central line in the SVC RA junction.  NG tube in the stomach. Interval improvement in  the diffuse mixed interstitial and airspace process.  The cardiac borders and hemidiaphragms are better visualize.  No enlarging effusion or pneumothorax.  Monitor leads overlie the chest.  IMPRESSION: Endotracheal tube 3.7 cm above the carina.  Improving diffuse airspace process versus edema.   Original Report Authenticated By: Judie Petit. Miles Costain, M.D.    Dg Chest Port 1 View  10/14/2012  *RADIOLOGY REPORT*  Clinical Data: Shortness of breath, confusion.  PORTABLE CHEST - 1 VIEW  Comparison:   the previous day's study  Findings: Moderate diffuse alveolar opacities throughout both lungs, largely stable.  Heart  size upper limits normal.  No definite effusion.  Regional bones unremarkable.  IMPRESSION:  Little change in bilateral infiltrates or edema.   Original Report Authenticated By: D. Andria Rhein, MD    Dg Chest Port 1 View  10/13/2012  *RADIOLOGY REPORT*  Clinical Data: Follow up infiltrates  PORTABLE CHEST - 1 VIEW  Comparison: 10/12/2012  Findings: Severe diffuse bilateral airspace disease is present. This shows mild progression bilaterally.  No effusion.  Heart size is normal.  IMPRESSION: Mild progression of diffuse bilateral airspace disease .   Original Report Authenticated By: Janeece Riggers, M.D.    Dg Abd Portable 2v  10/14/2012  *RADIOLOGY REPORT*  Clinical Data: Abdominal distention  PORTABLE ABDOMEN - 2 VIEW  Comparison: 07/30/2011  Findings: Scattered air and stool throughout the bowel.  NG tube in the stomach.  Monitor leads overlie the chest.  No definite free air on the decubitus view.  IMPRESSION: Negative for obstruction or free air.   Original Report Authenticated By: Judie Petit. Shick, M.D.     Scheduled Meds:    . antiseptic oral rinse  15 mL Mouth Rinse BID  . azithromycin (ZITHROMAX) 500 MG IVPB  500 mg Intravenous Q24H  . aztreonam  1 g Intravenous Q8H  . brinzolamide  1 drop Both Eyes Daily  . cycloSPORINE  1 drop Both Eyes BID  . enoxaparin (LOVENOX) injection  30 mg Subcutaneous QHS  . insulin aspart  2-6 Units Subcutaneous Q4H  . magnesium sulfate 1 - 4 g bolus IVPB  2 g Intravenous Once  . oseltamivir  75 mg Oral BID  . pantoprazole (PROTONIX) IV  40 mg Intravenous Q24H  . potassium chloride  40 mEq Per Tube Once  . potassium phosphate IVPB (mmol)  24 mmol Intravenous Once  . sulfaSALAzine  1,000 mg Oral BID   Continuous Infusions:    . sodium chloride 20 mL/hr at 10/13/12 1058  . feeding supplement (OXEPA) 1,000 mL (10/15/12 0100)  . fentaNYL infusion INTRAVENOUS 50 mcg/hr (10/14/12 1626)  . midazolam (VERSED) infusion 2 mg/hr (10/14/12 1626)    Principal Problem:   *Sepsis Active Problems:  DIABETES MELLITUS, TYPE II  ANXIETY  DEPRESSION  ASTHMA  Community acquired pneumonia  Bilateral lower abdominal discomfort  H1N1 influenza  Acute respiratory failure  ARDS (adult respiratory distress syndrome)  Crohn's disease  Metabolic acidosis    Time spent: > 35 minutes    Lakaisha Danish  [facility] Pager [phone]. If 7PM-7AM, please contact night-coverage at [email or web address], password [facility], [place] 10/15/2012, 7:10 AM  LOS: 4 days

## 2012-10-15 NOTE — Progress Notes (Signed)
PULMONARY  / CRITICAL CARE MEDICINE  Name: [name] MRN: [id number] DOB: 09-Aug-1961    LOS: 4  REFERRING MD :  Cena Benton  CHIEF COMPLAINT:  Dyspnea, tachycardia  LINES / TUBES: 1-14 et>> 1-14 cvl>> 1-14  Fem a line>>  CULTURES: Influenza A 1/11 >> positive H1N1 1/11 >> positive Blood 1/11 >>  resp 1/11 >> normal flora Urine 1/11 >> negative 1-15 bc x 2>> 1-15 uc>> 1-15 sputum>> ANTIBIOTICS: Aztreonam 1/11 >> 1/13. Restarted 1/14>> azithro 1/11 >>  vanco 1/11 >>1/14  SIGNIFICANT EVENTS:  1-14 intubated LEVEL OF CARE:  SDU PRIMARY SERVICE:  [facility]:  PCCM CODE STATUS: Full  DIET:  Full diet DVT Px:  SCD's GI Px:  N/a   HISTORY OF PRESENT ILLNESS:  52 yo HTN, DM, Hep C. Admitted 1/11 with dyspnea, cough, fever. CXR with RUL PNA. Progressive dyspnea, tachycardia, pulmonary infiltrates. CCM consulted 1/12. ARDS  INTERVAL HISTORY:  1/14 intubated Remains sinus tachy but better w sedation FiO2 to 0.40, PEEP to 8 on ARDS protocol  VITAL SIGNS: Temp:  [99.7 F (37.6 C)-101.5 F (38.6 C)] 101.5 F (38.6 C) (01/15 0900) Pulse Rate:  [48-151] 135  (01/15 0900) Resp:  [14-37] 37  (01/15 0900) BP: (75-133)/(52-98) 75/55 mmHg (01/15 0900) SpO2:  [85 %-100 %] 94 % (01/15 0900) Arterial Line BP: (88-158)/(52-106) 93/58 mmHg (01/15 0900) FiO2 (%):  [50 %] 50 % (01/15 0900) Weight:  [42 kg (92 lb 9.5 oz)] 42 kg (92 lb 9.5 oz) (01/15 0000) HEMODYNAMICS: CVP:  [6 mmHg-23 mmHg] 14 mmHg VENTILATOR SETTINGS: Vent Mode:  [-] PRVC FiO2 (%):  [50 %] 50 % Set Rate:  [24 bmp-31 bmp] 31 bmp Vt Set:  [360 mL-470 mL] 360 mL PEEP:  [8 cmH20] 8 cmH20 Plateau Pressure:  [22 cmH20-27 cmH20] 22 cmH20 INTAKE / OUTPUT: Intake/Output      01/14 0701 - 01/15 0700 01/15 0701 - 01/16 0700   I.V. (mL/kg) 786.9 (18.7) 62 (1.5)   NG/GT 310 70   IV Piggyback 2535 85   Total Intake(mL/kg) 3631.9 (86.5) 217 (5.2)   Urine (mL/kg/hr) 2260 (2.2) 350   Stool 1    Total Output 2261  350   Net +1370.9 -133        Stool Occurrence 2 x      PHYSICAL EXAMINATION: General:  Ill appearing, on vent  Neuro:  moves all ext, arouses to stimuli HEENT:  , OP moist Cardiovascular: Tachy 120-140's, regular, no M Lungs:  Coarse rhonchi B, diffuse crackles Abdomen:  +bs , on low flow tube feeds  Musculoskeletal:  No edema Skin:  No rash   LABS: Cbc  Lab 10/15/12 0812 10/15/12 0500 10/14/12 1630 10/14/12 0305  WBC -- 21.6* -- --  HGB -- 9.0* 10.4* 12.2  HCT -- 24.5* 28.8* 33.7*  PLT 107* 101* 126* --    Chemistry   Lab 10/15/12 0500 10/14/12 1805 10/14/12 1630 10/13/12 2040  NA 149* 148* 151* --  K 2.8* 2.4* 2.8* --  CL 122* 117* 118* --  CO2 19 21 22  --  BUN 12 14 15  --  CREATININE 1.15* 1.18* 1.20* --  CALCIUM 6.4* 6.3* 6.8* --  MG 1.6 -- 1.2* 1.1*  PHOS 1.1* -- -- --  GLUCOSE 104* 179* 22* --    Liver fxn  Lab 10/14/12 1630 10/14/12 0305 10/13/12 2040  AST 46* 68* 70*  ALT 22 24 26   ALKPHOS 112 123* 169*  BILITOT 0.4 0.3 0.4  PROT 4.7*  5.4* 6.1  ALBUMIN 1.5* 1.7* 1.9*   coags  Lab 10/15/12 0812 10/15/12 0500  APTT 50* --  INR 1.62* 1.55*   Sepsis markers  Lab 10/14/12 1805 10/14/12 0305 10/13/12 0345 10/12/12 1439 10/12/12 1437 10/11/12 1934  LATICACIDVEN 2.5* -- -- 1.0 -- 1.5  PROCALCITON -- 3.60 4.83 -- 5.67 --   Cardiac markers  Lab 10/14/12 0914 10/14/12 0305 10/13/12 2040  CKTOTAL -- -- --  CKMB -- -- --  TROPONINI <0.30 <0.30 <0.30   BNP No results found for this basename: PROBNP:3 in the last 168 hours ABG  Lab 10/14/12 1352 10/14/12 1225 10/14/12 1100  PHART 7.386 7.344* 7.254*  PCO2ART 33.3* 35.8 47.2*  PO2ART 99.3 68.9* 81.2  HCO3 19.6* 18.9* 20.2  TCO2 17.9 17.5 18.9    CBG trend  Lab 10/15/12 0746 10/14/12 2138 10/14/12 1809 10/14/12 1739 10/14/12 1738  GLUCAP 147* 70 139* 22* 19*    IMAGING: Dg Chest 1 View  10/14/2012  *RADIOLOGY REPORT*  Clinical Data: ETT, central line  CHEST - 1 VIEW  Comparison:  10/14/2012 at 0118 hours  Findings: Endotracheal tube terminates 5 mm above the carina and preferentially to dates the right mainstem bronchus.  Withdrawal approximately 2-3 cm is suggested.  Left IJ venous catheter terminates at the cavoatrial junction. Enteric tube courses into the stomach.  Stable diffuse coarse interstitial/airspace opacities. No pleural effusion or pneumothorax.  The heart is normal in size.  IMPRESSION: Endotracheal tube terminates 5 mm above the carina.  Withdrawal approximately 2-3 cm is suggested.  Left IJ venous catheter terminates at the cavoatrial junction. No pneumothorax.  Stable diffuse coarse interstitial/airspace opacities, possibly reflecting multifocal pneumonia or interstitial edema.   Original Report Authenticated By: Charline Bills, M.D.    Dg Chest Port 1 View  10/15/2012  *RADIOLOGY REPORT*  Clinical Data: Endotracheal tube  PORTABLE CHEST - 1 VIEW  Comparison: Chest radiograph 10/14/2012  Findings: Endotracheal tube, NG tube, and left central venous line are unchanged.  Stable cardiac silhouette.  There is diffuse fine air space disease which is stable compared to prior with no improvement.  No pneumothorax  IMPRESSION:  1.  Stable support apparatus. 2.  Extensive diffuse bilateral air space disease is not improved.   Original Report Authenticated By: Genevive Bi, M.D.    Dg Chest Port 1 View  10/14/2012  *RADIOLOGY REPORT*  Clinical Data: Intubation  PORTABLE CHEST - 1 VIEW  Comparison: 10/14/2012  Findings: Endotracheal tube 3.7 cm above the carina.  Left IJ central line in the SVC RA junction.  NG tube in the stomach. Interval improvement in the diffuse mixed interstitial and airspace process.  The cardiac borders and hemidiaphragms are better visualize.  No enlarging effusion or pneumothorax.  Monitor leads overlie the chest.  IMPRESSION: Endotracheal tube 3.7 cm above the carina.  Improving diffuse airspace process versus edema.   Original Report  Authenticated By: Judie Petit. Miles Costain, M.D.    Dg Chest Port 1 View  10/14/2012  *RADIOLOGY REPORT*  Clinical Data: Shortness of breath, confusion.  PORTABLE CHEST - 1 VIEW  Comparison:   the previous day's study  Findings: Moderate diffuse alveolar opacities throughout both lungs, largely stable.  Heart size upper limits normal.  No definite effusion.  Regional bones unremarkable.  IMPRESSION:  Little change in bilateral infiltrates or edema.   Original Report Authenticated By: D. Andria Rhein, MD    Dg Chest Port 1 View  10/13/2012  *RADIOLOGY REPORT*  Clinical Data: Follow up infiltrates  PORTABLE CHEST - 1 VIEW  Comparison: 10/12/2012  Findings: Severe diffuse bilateral airspace disease is present. This shows mild progression bilaterally.  No effusion.  Heart size is normal.  IMPRESSION: Mild progression of diffuse bilateral airspace disease .   Original Report Authenticated By: Janeece Riggers, M.D.    Dg Abd Portable 2v  10/14/2012  *RADIOLOGY REPORT*  Clinical Data: Abdominal distention  PORTABLE ABDOMEN - 2 VIEW  Comparison: 07/30/2011  Findings: Scattered air and stool throughout the bowel.  NG tube in the stomach.  Monitor leads overlie the chest.  No definite free air on the decubitus view.  IMPRESSION: Negative for obstruction or free air.   Original Report Authenticated By: Judie Petit. Shick, M.D.     ECG:  DIAGNOSES: Principal Problem:  *Sepsis Active Problems:  DIABETES MELLITUS, TYPE II  ANXIETY  DEPRESSION  ASTHMA  Community acquired pneumonia  Bilateral lower abdominal discomfort  H1N1 influenza  Acute respiratory failure  ARDS (adult respiratory distress syndrome)  Crohn's disease  Metabolic acidosis  Hypernatremia  Hypokalemia  Hypomagnesemia  Hypophosphatemia  Leukocytosis  Normocytic anemia  Thrombocytopenia  Elevated INR  Respiratory failure   ASSESSMENT / PLAN:  PULMONARY  ASSESSMENT: Acute resp failure due to H1N1 pneumonitis vs ARDS RUL CAP  PLAN:   - Tamiflu, for at  least 10 days - d/c'd vanco 1/14, continue aztreonam + azithro - ARDS protocol 1/14 - BD's as ordered  CARDIOVASCULAR  ASSESSMENT:  Severe sepsis, at risk for septic shock, currently hemodynamically stable except for sinus tachy Sinus tachycardia > likely due to her overall critical illness, but she also has hx narcotics use so consider withdrawal  PLAN:  - try to minimize volume resuscitation for now given ARDS, goal CVP 5-7  RENAL  ASSESSMENT:   Acute renal failure, due to sepsis, improving slowly Metabolic acidosis Lab Results  Component Value Date   CREATININE 1.15* 10/15/2012   CREATININE 1.18* 10/14/2012   CREATININE 1.20* 10/14/2012    Intake/Output Summary (Last 24 hours) at 10/15/12 1012 Last data filed at 10/15/12 0900  Gross per 24 hour  Intake 3788.87 ml  Output   2111 ml  Net 1677.87 ml   PLAN:   - follow BMP q12h for now, and UOP - d/c'd bicarb 1/14 and follow ABG  GASTROINTESTINAL  ASSESSMENT:  abd pain, nausea, likely due to viral process PLAN:   - KUB neg 1/14  HEMATOLOGIC  ASSESSMENT:   PLAN:  - DVT prophylaxis  INFECTIOUS  ASSESSMENT:   RUL CAP on presentation CXR Influenza H1N1 positive Severe Sepsis PLAN:   - abx coverage for severe CAP, see flowsheet; as above, d/c'd vanco in absence evidence for community acquired MRSA - tamiflu x 10 days+  - follow hemodynamics for possible evolving septic shock (remains sinus tachy) - 1/15 reculture for fever spike 101  ENDOCRINE  ASSESSMENT:  DM    PLAN:   - SSI protocol  NEUROLOGIC  ASSESSMENT:   Mild delirium; improved 1/13, suspect due to critical illness + narcotics. Worse on 1/14. Agitated and confused or obtunded due to overall medical decline. Currently intubated and sedated. PLAN:   - continuous sedation protocol  CLINICAL SUMMARY: 52 yo HTN, DM, Hep C. Admitted 1/11 with dyspnea, cough, fever. CXR with RUL PNA. Progressive dyspnea, tachycardia, pulmonary infiltrates > ARDS + Flu  pneumonitis. CCM consulted 1/12. 1/14 ETT for ARDS.   Brett Canales Minor ACNP [facility] Pager [phone] till 3 pm If no answer page [phone] 10/15/2012, 10:12 AM  45  minutes CC time  Levy Pupa, MD, PhD 10/15/2012, 11:53 AM [facility] [phone] or if no answer [phone]

## 2012-10-15 NOTE — Progress Notes (Signed)
eLink Physician-Brief Progress Note Patient Name: [name] DOB: 06/15/1961 MRN: [id number]  Date of Service  10/15/2012   HPI/Events of Note  HR 128, MAP 54, sbp 79, CVP 6  - sugars in range but patient on vent   eICU Interventions  Plan  - cvp q4h check ; goal > 10, give 2L bolus now  - change to ICU hyperglycemia protoco   Intervention Category Major Interventions: Hypovolemia - evaluation and treatment with fluids;Hypotension - evaluation and management Intermediate Interventions: Communication with other healthcare providers and/or family  [name] 10/15/2012, 12:21 AM

## 2012-10-15 NOTE — Plan of Care (Signed)
Problem: Phase I Progression Outcomes Goal: Hemodynamically stable Outcome: Not Progressing Levophed drip started for MAP  less than 65.

## 2012-10-15 NOTE — Progress Notes (Signed)
eLink Physician-Brief Progress Note Patient Name: [name] DOB: 04-Feb-1961 MRN: [id number]  Date of Service  10/15/2012   HPI/Events of Note   Lab 10/15/12 0500 10/14/12 1805 10/14/12 1630 10/14/12 0305 10/13/12 2040  NA 149* 148* 151* 145 146*  K 2.8* 2.4* -- -- --  CL 122* 117* 118* 116* 117*  CO2 19 21 22  14* 10*  GLUCOSE 104* 179* 22* 228* 100*  BUN 12 14 15 18 22   CREATININE 1.15* 1.18* 1.20* 1.42* 1.62*  CALCIUM 6.4* 6.3* 6.8* 7.2* 8.2*  MG 1.6 -- 1.2* -- 1.1*  PHOS 1.1* -- -- -- --   Low K Low Phos Low MAg Low calcium but albumin ver low   eICU Interventions  kcl 40 po + 4o po repeat K phos 24 mmol Mag 2gm   Intervention Category Major Interventions: Electrolyte abnormality - evaluation and management  Eeva Schlosser 10/15/2012, 5:50 AM

## 2012-10-16 ENCOUNTER — Inpatient Hospital Stay (HOSPITAL_COMMUNITY): Payer: Medicare Other

## 2012-10-16 DIAGNOSIS — N179 Acute kidney failure, unspecified: Secondary | ICD-10-CM

## 2012-10-16 LAB — PROTIME-INR
INR: 1.42 (ref 0.00–1.49)
Prothrombin Time: 17 seconds — ABNORMAL HIGH (ref 11.6–15.2)

## 2012-10-16 LAB — GLUCOSE, CAPILLARY
Glucose-Capillary: 116 mg/dL — ABNORMAL HIGH (ref 70–99)
Glucose-Capillary: 139 mg/dL — ABNORMAL HIGH (ref 70–99)
Glucose-Capillary: 141 mg/dL — ABNORMAL HIGH (ref 70–99)
Glucose-Capillary: 220 mg/dL — ABNORMAL HIGH (ref 70–99)
Glucose-Capillary: 88 mg/dL (ref 70–99)

## 2012-10-16 LAB — CBC
HCT: 24.9 % — ABNORMAL LOW (ref 36.0–46.0)
Hemoglobin: 9 g/dL — ABNORMAL LOW (ref 12.0–15.0)
MCH: 29.1 pg (ref 26.0–34.0)
MCHC: 36.1 g/dL — ABNORMAL HIGH (ref 30.0–36.0)
RDW: 15.1 % (ref 11.5–15.5)

## 2012-10-16 LAB — URINE CULTURE
Colony Count: NO GROWTH
Culture: NO GROWTH

## 2012-10-16 LAB — RENAL FUNCTION PANEL
Albumin: 1.4 g/dL — ABNORMAL LOW (ref 3.5–5.2)
BUN: 13 mg/dL (ref 6–23)
Chloride: 119 mEq/L — ABNORMAL HIGH (ref 96–112)
GFR calc non Af Amer: 48 mL/min — ABNORMAL LOW (ref >90)
Phosphorus: 1.8 mg/dL — ABNORMAL LOW (ref 2.3–4.6)
Potassium: 3.6 mEq/L (ref 3.5–5.1)
Sodium: 147 mEq/L — ABNORMAL HIGH (ref 135–145)

## 2012-10-16 LAB — MAGNESIUM: Magnesium: 1.8 mg/dL (ref 1.5–2.5)

## 2012-10-16 MED ORDER — ACETAMINOPHEN 160 MG/5ML PO SOLN
650.0000 mg | Freq: Four times a day (QID) | ORAL | Status: DC | PRN
  Administered 2012-10-16: 650 mg
  Filled 2012-10-16: qty 20.3

## 2012-10-16 MED ORDER — ADULT MULTIVITAMIN LIQUID CH
5.0000 mL | Freq: Every day | ORAL | Status: DC
  Administered 2012-10-16 – 2012-10-17 (×2): 5 mL via ORAL
  Filled 2012-10-16 (×3): qty 5

## 2012-10-16 MED ORDER — SODIUM CHLORIDE 0.9 % IV BOLUS (SEPSIS)
500.0000 mL | Freq: Once | INTRAVENOUS | Status: AC
Start: 2012-10-16 — End: 2012-10-16
  Administered 2012-10-16: 500 mL via INTRAVENOUS

## 2012-10-16 MED ORDER — OSELTAMIVIR PHOSPHATE 75 MG PO CAPS
75.0000 mg | ORAL_CAPSULE | Freq: Two times a day (BID) | ORAL | Status: AC
  Administered 2012-10-16 – 2012-10-21 (×10): 75 mg via ORAL
  Filled 2012-10-16 (×11): qty 1

## 2012-10-16 MED ORDER — PHENYLEPHRINE HCL 10 MG/ML IJ SOLN
30.0000 ug/min | INTRAVENOUS | Status: DC
  Administered 2012-10-16 (×2): 30 ug/min via INTRAVENOUS
  Administered 2012-10-17 – 2012-10-18 (×2): 10 ug/min via INTRAVENOUS
  Filled 2012-10-16 (×5): qty 1

## 2012-10-16 NOTE — Clinical Social Work Note (Signed)
CSW attempted to contact family for Pt without success. Pt's son's number went to vm as did home, work and her nephew's numbers. CSW left messages for family to contact [facility] and left ICU number. If family visits or calls, please attempt to get correct numbers.   Doreen Salvage, LCSW ICU/Stepdown Clinical Social Worker [facility] Cell [phone] Hours 8am-1200pm M-F

## 2012-10-16 NOTE — Progress Notes (Signed)
NUTRITION FOLLOW UP  Intervention:   1. Enteral nutrition; Continue Oxepa at 40 mL/hr to provide 1440 kcal, 60g protein, 753 mL free water. 2.  Nutrition-related medication; add liquid MVI daily.  Nutrition Dx:   Inadequate oral intake, ongoing  Monitor:   1. Enteral nutrition; initiation with tolerance. Pt meeting >/=90% estimated needs. Met, ongoing. 2. Wt/wt change; monitor trends. Ongoing. 3.  Gastrointestinal; monitor stool output.   Assessment:   Pt admitted with dyspnea and tachycardia, found to have H1N1 penumonititis vs. ARDS.  Pt remains intubated. Pt recently started TFs of Oxepa and has reached goal of 40 mL/hr. Residuals: 30 mL at last check per RN. RN reports diarrhea overnight.  Pt with diarrhea prior to starting TFs- 6 episodes (1/13). Patient is currently intubated on ventilator support.  MV: 13.7 L/min Temp:Temp (24hrs), Avg:99 F (37.2 C), Min:93.2 F (34 C), Max:100.8 F (38.2 C)   Height: Ht Readings from Last 1 Encounters:  10/12/12 5\' 6"  (1.676 m)    Weight Status:   Wt Readings from Last 1 Encounters:  10/15/12 92 lb 9.5 oz (42 kg)    Re-estimated needs:  Kcal: 1590 Protein: 54-62g Fluid: ~1.5 L/day  Skin: Stage 2 to buttocks  Diet Order:     Intake/Output Summary (Last 24 hours) at 10/16/12 1108 Last data filed at 10/16/12 1000  Gross per 24 hour  Intake 2639.84 ml  Output   1995 ml  Net 644.84 ml    Last BM: 1/16   Labs:   Lab 10/16/12 0500 10/15/12 1245 10/15/12 0500  NA 147* 147* 149*  K 3.6 4.8 2.8*  CL 119* 120* 122*  CO2 18* 16* 19  BUN 13 12 12   CREATININE 1.26* 1.22* 1.15*  CALCIUM 7.4* 6.7* 6.4*  MG 1.8 2.1 1.6  PHOS 1.8* 2.7 1.1*  GLUCOSE 90 195* 104*    CBG (last 3)   Basename 10/16/12 0749 10/16/12 0510 10/15/12 2016  GLUCAP 103* 88 113*    Scheduled Meds:   . antiseptic oral rinse  15 mL Mouth Rinse QID  . azithromycin (ZITHROMAX) 500 MG IVPB  500 mg Intravenous Q24H  . aztreonam  1 g  Intravenous Q8H  . brinzolamide  1 drop Both Eyes Daily  . chlorhexidine  15 mL Mouth Rinse BID  . cycloSPORINE  1 drop Both Eyes BID  . enoxaparin (LOVENOX) injection  30 mg Subcutaneous QHS  . insulin aspart  0-5 Units Subcutaneous Q4H  . oseltamivir  75 mg Oral BID  . oseltamivir  75 mg Oral BID  . pantoprazole (PROTONIX) IV  40 mg Intravenous Q24H  . sulfaSALAzine  1,000 mg Oral BID    Continuous Infusions:   . sodium chloride 20 mL/hr at 10/16/12 0846  . feeding supplement (OXEPA) 1,000 mL (10/15/12 0848)  . fentaNYL infusion INTRAVENOUS 12.5 mcg/hr (10/16/12 1000)  . midazolam (VERSED) infusion 0.5 mg/hr (10/16/12 1000)  . norepinephrine (LEVOPHED) Adult infusion 2 mcg/min (10/15/12 2300)    Loyce Dys, MS RD LDN Clinical Inpatient Dietitian Pager: [phone] Weekend/After hours pager: [phone]

## 2012-10-16 NOTE — Progress Notes (Signed)
PULMONARY  / CRITICAL CARE MEDICINE  Name: [name] MRN: [id number] DOB: 08-Aug-1961    LOS: 5  REFERRING MD :  Cena Benton  CHIEF COMPLAINT:  Dyspnea, tachycardia  LINES / TUBES: 1-14 et>> 1-14 cvl>> 1-14  Fem a line>>  CULTURES: Influenza A 1/11 >> positive H1N1 1/11 >> positive Blood 1/11 >>  resp 1/11 >> normal flora Urine 1/11 >> negative 1-15 bc x 2>> 1-15 uc>> 1-15 sputum>> ANTIBIOTICS: Aztreonam 1/11 >> 1/13. Restarted 1/14>> azithro 1/11 >>  vanco 1/11 >>1/14  SIGNIFICANT EVENTS:  1-14 intubated LEVEL OF CARE:  SDU PRIMARY SERVICE:  [facility] CONSULTANTS:  PCCM CODE STATUS: Full  DIET:  Full diet DVT Px:  SCD's GI Px:  N/a   HISTORY OF PRESENT ILLNESS:  52 yo HTN, DM, Hep C. Admitted 1/11 with dyspnea, cough, fever. CXR with RUL PNA. Progressive dyspnea, tachycardia, pulmonary infiltrates. CCM consulted 1/12. ARDS  INTERVAL HISTORY:  Remains sinus tachy but better w sedation FiO2 to 0.40, PEEP to 5 on ARDS protocol Low dose norepi started 1/15-16  VITAL SIGNS: Temp:  [93.2 F (34 C)-101.5 F (38.6 C)] 100.6 F (38.1 C) (01/16 0800) Pulse Rate:  [103-135] 112  (01/16 0800) Resp:  [27-38] 29  (01/16 0800) BP: (69-126)/(48-85) 76/54 mmHg (01/16 0600) SpO2:  [84 %-97 %] 97 % (01/16 0800) Arterial Line BP: (79-145)/(48-87) 102/61 mmHg (01/16 0800) FiO2 (%):  [40 %-50 %] 40 % (01/16 0715) HEMODYNAMICS: CVP:  [9 mmHg-12 mmHg] 9 mmHg VENTILATOR SETTINGS: Vent Mode:  [-] PRVC FiO2 (%):  [40 %-50 %] 40 % Set Rate:  [31 bmp] 31 bmp Vt Set:  [360 mL] 360 mL PEEP:  [5 cmH20] 5 cmH20 Plateau Pressure:  [21 cmH20-23 cmH20] 23 cmH20 INTAKE / OUTPUT: Intake/Output      01/15 0701 - 01/16 0700 01/16 0701 - 01/17 0700   I.V. (mL/kg) 1271.5 (30.3) 51 (1.2)   NG/GT 910 40   IV Piggyback 825    Total Intake(mL/kg) 3006.5 (71.6) 91 (2.2)   Urine (mL/kg/hr) 2295 (2.3) 225   Stool 0    Total Output 2295 225   Net +711.5 -134        Stool Occurrence 2 x       PHYSICAL EXAMINATION: General:  Ill appearing, on vent  Neuro:  moves all ext, RASS -3 to -2 HEENT:  OP moist Cardiovascular: Tachy 120-140's, regular, no M Lungs:  Coarse rhonchi B, diffuse crackles Abdomen:  +bs , on low flow tube feeds  Musculoskeletal:  No edema Skin:  No rash  LABS: Cbc  Lab 10/16/12 0500 10/15/12 0812 10/15/12 0500 10/14/12 1630  WBC 15.2* -- -- --  HGB 9.0* -- 9.0* 10.4*  HCT 24.9* -- 24.5* 28.8*  PLT 76* 107* 101* --    Chemistry   Lab 10/16/12 0500 10/15/12 1245 10/15/12 0500  NA 147* 147* 149*  K 3.6 4.8 2.8*  CL 119* 120* 122*  CO2 18* 16* 19  BUN 13 12 12   CREATININE 1.26* 1.22* 1.15*  CALCIUM 7.4* 6.7* 6.4*  MG 1.8 2.1 1.6  PHOS 1.8* 2.7 1.1*  GLUCOSE 90 195* 104*    Liver fxn  Lab 10/16/12 0500 10/15/12 1245 10/14/12 1630 10/14/12 0305 10/13/12 2040  AST -- -- 46* 68* 70*  ALT -- -- 22 24 26   ALKPHOS -- -- 112 123* 169*  BILITOT -- -- 0.4 0.3 0.4  PROT -- -- 4.7* 5.4* 6.1  ALBUMIN 1.4* 1.3* 1.5* -- --   coags  Lab 10/16/12 0500 10/15/12 0812 10/15/12 0500  APTT -- 50* --  INR 1.42 1.62* 1.55*   Sepsis markers  Lab 10/14/12 1805 10/14/12 0305 10/13/12 0345 10/12/12 1439 10/12/12 1437 10/11/12 1934  LATICACIDVEN 2.5* -- -- 1.0 -- 1.5  PROCALCITON -- 3.60 4.83 -- 5.67 --   Cardiac markers  Lab 10/14/12 0914 10/14/12 0305 10/13/12 2040  CKTOTAL -- -- --  CKMB -- -- --  TROPONINI <0.30 <0.30 <0.30   BNP No results found for this basename: PROBNP:3 in the last 168 hours ABG  Lab 10/14/12 1352 10/14/12 1225 10/14/12 1100  PHART 7.386 7.344* 7.254*  PCO2ART 33.3* 35.8 47.2*  PO2ART 99.3 68.9* 81.2  HCO3 19.6* 18.9* 20.2  TCO2 17.9 17.5 18.9    CBG trend  Lab 10/16/12 0749 10/16/12 0510 10/15/12 2016 10/15/12 1633 10/15/12 1134  GLUCAP 103* 88 113* 160* 153*    IMAGING: Dg Chest 1 View  10/14/2012  *RADIOLOGY REPORT*  Clinical Data: ETT, central line  CHEST - 1 VIEW  Comparison: 10/14/2012 at 0118 hours   Findings: Endotracheal tube terminates 5 mm above the carina and preferentially to dates the right mainstem bronchus.  Withdrawal approximately 2-3 cm is suggested.  Left IJ venous catheter terminates at the cavoatrial junction. Enteric tube courses into the stomach.  Stable diffuse coarse interstitial/airspace opacities. No pleural effusion or pneumothorax.  The heart is normal in size.  IMPRESSION: Endotracheal tube terminates 5 mm above the carina.  Withdrawal approximately 2-3 cm is suggested.  Left IJ venous catheter terminates at the cavoatrial junction. No pneumothorax.  Stable diffuse coarse interstitial/airspace opacities, possibly reflecting multifocal pneumonia or interstitial edema.   Original Report Authenticated By: Charline Bills, M.D.    Dg Chest Port 1 View  10/16/2012  *RADIOLOGY REPORT*  Clinical Data: Endotracheal tube.  PORTABLE CHEST - 1 VIEW  Comparison: 10/15/2012.  Findings: The endotracheal tube tip is 27 mm from the carina.  Left IJ central line and enteric tube remain present.  Diffuse bilateral airspace disease is present, without interval change compared yesterday.  Cardiopericardial silhouette and mediastinal contours are also unchanged.  IMPRESSION: No interval change.  Stable support apparatus.   Original Report Authenticated By: Andreas Newport, M.D.    Dg Chest Port 1 View  10/15/2012  *RADIOLOGY REPORT*  Clinical Data: Endotracheal tube  PORTABLE CHEST - 1 VIEW  Comparison: Chest radiograph 10/14/2012  Findings: Endotracheal tube, NG tube, and left central venous line are unchanged.  Stable cardiac silhouette.  There is diffuse fine air space disease which is stable compared to prior with no improvement.  No pneumothorax  IMPRESSION:  1.  Stable support apparatus. 2.  Extensive diffuse bilateral air space disease is not improved.   Original Report Authenticated By: Genevive Bi, M.D.    Dg Chest Port 1 View  10/14/2012  *RADIOLOGY REPORT*  Clinical Data: Intubation   PORTABLE CHEST - 1 VIEW  Comparison: 10/14/2012  Findings: Endotracheal tube 3.7 cm above the carina.  Left IJ central line in the SVC RA junction.  NG tube in the stomach. Interval improvement in the diffuse mixed interstitial and airspace process.  The cardiac borders and hemidiaphragms are better visualize.  No enlarging effusion or pneumothorax.  Monitor leads overlie the chest.  IMPRESSION: Endotracheal tube 3.7 cm above the carina.  Improving diffuse airspace process versus edema.   Original Report Authenticated By: Judie Petit. Miles Costain, M.D.    Dg Abd Portable 2v  10/14/2012  *RADIOLOGY REPORT*  Clinical Data: Abdominal distention  PORTABLE ABDOMEN - 2 VIEW  Comparison: 07/30/2011  Findings: Scattered air and stool throughout the bowel.  NG tube in the stomach.  Monitor leads overlie the chest.  No definite free air on the decubitus view.  IMPRESSION: Negative for obstruction or free air.   Original Report Authenticated By: Judie Petit. Shick, M.D.     ECG:  DIAGNOSES: Principal Problem:  *Sepsis Active Problems:  DIABETES MELLITUS, TYPE II  ANXIETY  DEPRESSION  ASTHMA  Community acquired pneumonia  Bilateral lower abdominal discomfort  H1N1 influenza  Acute respiratory failure  ARDS (adult respiratory distress syndrome)  Crohn's disease  Metabolic acidosis  Hypernatremia  Hypokalemia  Hypomagnesemia  Hypophosphatemia  Leukocytosis  Normocytic anemia  Thrombocytopenia  Elevated INR  Respiratory failure   ASSESSMENT / PLAN:  PULMONARY  ASSESSMENT: Acute resp failure due to H1N1 pneumonitis vs ARDS RUL CAP  PLAN:   - Tamiflu, for at least 10 days - d/c'd vanco 1/14, continue aztreonam + azithro for 8-10 days - ARDS protocol 1/14 - BD's as ordered  CARDIOVASCULAR  ASSESSMENT:  Severe sepsis,septic shock, on low dose norepi Sinus tachycardia > likely due to her overall critical illness, but she also has hx narcotics use so consider withdrawal  PLAN:  - try to minimize volume  resuscitation for now given ARDS -pressors as needed -push cvp to 10  RENAL  ASSESSMENT:   Acute renal failure, due to sepsis, stable Metabolic acidosis Lab Results  Component Value Date   CREATININE 1.26* 10/16/2012   CREATININE 1.22* 10/15/2012   CREATININE 1.15* 10/15/2012    Intake/Output Summary (Last 24 hours) at 10/16/12 0859 Last data filed at 10/16/12 0800  Gross per 24 hour  Intake 2952.04 ml  Output   2170 ml  Net 782.04 ml   PLAN:   - follow BMP and UOP - d/c'd bicarb 1/14 and follow ABG  GASTROINTESTINAL  ASSESSMENT:  abd pain, nausea, likely due to viral process PLAN:   - KUB neg 1/14  HEMATOLOGIC  ASSESSMENT:   PLAN:  - DVT prophylaxis  INFECTIOUS  ASSESSMENT:   RUL CAP on presentation CXR Influenza H1N1 positive Severe Sepsis PLAN:   - abx coverage for severe CAP, see flowsheet; as above, d/c'd vanco in absence evidence for community acquired MRSA - tamiflu x 10 days+  - 1/15 recultured for fever spike 101  ENDOCRINE  ASSESSMENT:  DM    PLAN:   - SSI protocol  NEUROLOGIC  ASSESSMENT:   Mild delirium; improved 1/13, suspect due to critical illness + narcotics. Worse on 1/14. Agitated and confused or obtunded due to overall medical decline. Currently intubated and sedated. PLAN:   - continuous sedation protocol  CLINICAL SUMMARY: 52 yo HTN, DM, Hep C. Admitted 1/11 with dyspnea, cough, fever. CXR with RUL PNA. Progressive dyspnea, tachycardia, pulmonary infiltrates > ARDS + Flu pneumonitis. CCM consulted 1/12. 1/14 ETT for ARDS.   Brett Canales Minor ACNP [facility] Pager [phone] till 3 pm If no answer page [phone] 10/16/2012, 8:59 AM  30 minutes CC time  Levy Pupa, MD, PhD 10/16/2012, 11:35 AM [facility] [phone] or if no answer [phone]

## 2012-10-16 NOTE — Progress Notes (Signed)
Pt's HR increased as high as 150 bpm around 2315, RR in 40s, over set vent rate of 30. Pt had temp of 102.2 at 2100, that was treated with 650mg  of tylenol. Temp still elevated. Sedation increased. Pola Corn MD aware. Will continue to monitor.

## 2012-10-17 ENCOUNTER — Inpatient Hospital Stay (HOSPITAL_COMMUNITY): Payer: Medicare Other

## 2012-10-17 LAB — CULTURE, BLOOD (ROUTINE X 2)
Culture: NO GROWTH
Culture: NO GROWTH

## 2012-10-17 LAB — CBC
HCT: 24.4 % — ABNORMAL LOW (ref 36.0–46.0)
Hemoglobin: 8.8 g/dL — ABNORMAL LOW (ref 12.0–15.0)
MCV: 81.9 fL (ref 78.0–100.0)
WBC: 11.8 10*3/uL — ABNORMAL HIGH (ref 4.0–10.5)

## 2012-10-17 LAB — GLUCOSE, CAPILLARY
Glucose-Capillary: 113 mg/dL — ABNORMAL HIGH (ref 70–99)
Glucose-Capillary: 144 mg/dL — ABNORMAL HIGH (ref 70–99)
Glucose-Capillary: 81 mg/dL (ref 70–99)
Glucose-Capillary: 73 mg/dL (ref 70–99)

## 2012-10-17 LAB — RENAL FUNCTION PANEL
Albumin: 1.2 g/dL — ABNORMAL LOW (ref 3.5–5.2)
BUN: 14 mg/dL (ref 6–23)
CO2: 19 mEq/L (ref 19–32)
Calcium: 7.4 mg/dL — ABNORMAL LOW (ref 8.4–10.5)
Chloride: 117 mEq/L — ABNORMAL HIGH (ref 96–112)
Creatinine, Ser: 1.28 mg/dL — ABNORMAL HIGH (ref 0.50–1.10)

## 2012-10-17 LAB — BASIC METABOLIC PANEL
BUN: 14 mg/dL (ref 6–23)
Chloride: 117 mEq/L — ABNORMAL HIGH (ref 96–112)
Creatinine, Ser: 1.3 mg/dL — ABNORMAL HIGH (ref 0.50–1.10)
Glucose, Bld: 113 mg/dL — ABNORMAL HIGH (ref 70–99)
Potassium: 3.4 mEq/L — ABNORMAL LOW (ref 3.5–5.1)

## 2012-10-17 LAB — PROTIME-INR: INR: 1.48 (ref 0.00–1.49)

## 2012-10-17 LAB — CULTURE, RESPIRATORY W GRAM STAIN

## 2012-10-17 MED ORDER — VITAMIN B-1 100 MG PO TABS
100.0000 mg | ORAL_TABLET | Freq: Every day | ORAL | Status: DC
  Administered 2012-10-17 – 2012-10-19 (×3): 100 mg via ORAL
  Filled 2012-10-17 (×3): qty 1

## 2012-10-17 MED ORDER — FOLIC ACID 1 MG PO TABS
1.0000 mg | ORAL_TABLET | Freq: Every day | ORAL | Status: DC
  Administered 2012-10-17 – 2012-10-19 (×3): 1 mg via ORAL
  Filled 2012-10-17 (×3): qty 1

## 2012-10-17 MED ORDER — POTASSIUM PHOSPHATE DIBASIC 3 MMOLE/ML IV SOLN
30.0000 mmol | Freq: Once | INTRAVENOUS | Status: AC
  Administered 2012-10-17: 30 mmol via INTRAVENOUS
  Filled 2012-10-17: qty 10

## 2012-10-17 MED ORDER — CLONAZEPAM 0.5 MG PO TABS
0.5000 mg | ORAL_TABLET | Freq: Two times a day (BID) | ORAL | Status: DC
  Filled 2012-10-17: qty 1

## 2012-10-17 MED ORDER — SODIUM CHLORIDE 0.9 % IV BOLUS (SEPSIS)
750.0000 mL | Freq: Once | INTRAVENOUS | Status: AC
  Administered 2012-10-17: 750 mL via INTRAVENOUS

## 2012-10-17 MED ORDER — QUETIAPINE FUMARATE 25 MG PO TABS
25.0000 mg | ORAL_TABLET | Freq: Two times a day (BID) | ORAL | Status: DC
  Administered 2012-10-17 – 2012-10-18 (×2): 25 mg via ORAL
  Filled 2012-10-17 (×4): qty 1

## 2012-10-17 NOTE — Progress Notes (Signed)
The staff called, unable to identify the OG tube position based on auscultation over the gastric area. Also noticed bubbles at the nose;   KUB for placement verification.

## 2012-10-17 NOTE — Progress Notes (Signed)
Patient MAP <65 after bolus and pressors turned off.  Second bolus of NS started per order.  Dr. Delford Field notified at bedside.  Will finish 2nd bolus and restart pressors for MAP <65.  Additionally, patient dripping substance from nostrils that appears to be tube feeding.  Placement confirmed by 2 RN and morning xray.  Instructed to hold tube feeds at this time.  Will continue to monitor.

## 2012-10-17 NOTE — Progress Notes (Signed)
eLink Physician-Brief Progress Note Patient Name: [name] DOB: 1961/01/26 MRN: [id number]  Date of Service  10/17/2012   HPI/Events of Note  Hypokalemia and hypophosphatemia  eICU Interventions  Potassium and Phos replaced   Intervention Category Intermediate Interventions: Electrolyte abnormality - evaluation and management  DETERDING,ELIZABETH 10/17/2012, 5:46 AM

## 2012-10-17 NOTE — Progress Notes (Signed)
[id number]/Rhonda Davis, RN, BSN, CCM:  CHART REVIEWED AND UPDATED.  Next chart review due on [id number]. NO DISCHARGE NEEDS PRESENT AT THIS TIME. CASE MANAGEMENT [phone] 

## 2012-10-17 NOTE — Progress Notes (Addendum)
ANTIBIOTIC CONSULT NOTE - FOLLOW UP  Pharmacy Consult for Aztreonam Indication: pneumonia  Allergies  Allergen Reactions  . Iohexol Other (See Comments)    "severe burning"  . Penicillins Hives   Labs:  Basename 10/17/12 0335 10/16/12 0500 10/15/12 1245 10/15/12 0812 10/15/12 0500  WBC 11.8* 15.2* -- -- 21.6*  HGB 8.8* 9.0* -- -- 9.0*  PLT 74* 76* -- 107* --  LABCREA -- -- -- -- --  CREATININE 1.28*1.30* 1.26* 1.22* -- --    Assessment:  51 yof admitted 1/11 with dyspnea, cough, fever. CXR with RUL PNA.  Patient received Vanc from 1/11 to 1/14.  Today patient is on Day #7 Aztreonam (missed 1/13 doses), Azithromycin for pneumonia and Day # 7 Tamiflu for +influenza/H1N1.  Patient with fever 1/15 and was re-cultured.  Cultures thus far are as below   1/11 blood x 2 >> NGTD 1/11 urine >> 95K multiple organisms 1/11 mrsa pcr >> negative 1/12 sputum >> normal flora 1/11 influenza panel >> positive, +H1N1 1/14 c.diff pcr >> neg 1/15 blood x 2 >> NGTD  1/15 urine >> NGF 1/15 sputum >> few candida albicans   Tmax 102.5 yesterday, afebrile since this AM.  WBC improving to 11.8K, Scr 1.3 for CrCl 47 ml/min. Procalcitonin improving to 3.6.  Spoke with Dr. Delton Coombes regarding candida in sputum - will not add additional coverage for now - will monitor   Plan:   Continue Aztreonam 1gm IV q8h as ordered  Pharmacy will f/u  Thanks  Geoffry Paradise, PharmD, BCPS Pager: [phone] 11:34 AM Pharmacy #: [id number]

## 2012-10-17 NOTE — Progress Notes (Signed)
PULMONARY  / CRITICAL CARE MEDICINE  Name: [name] MRN: [id number] DOB: 1961-01-28    LOS: 6  REFERRING MD :  Cena Benton  CHIEF COMPLAINT:  Dyspnea, tachycardia  LINES / TUBES: 1-14 et>> 1-14 cvl>> 1-14  Fem a line>>  CULTURES: Influenza A 1/11 >> positive H1N1 1/11 >> positive Blood 1/11 >>  resp 1/11 >> normal flora Urine 1/11 >> negative 1-15 bc x 2>> 1-15 uc>>neg 1-15 sputum>> ANTIBIOTICS: Aztreonam 1/11 >> 1/13. Restarted 1/14>> azithro 1/11 >>  vanco 1/11 >>1/14  SIGNIFICANT EVENTS:  1-14 intubated LEVEL OF CARE:  SDU PRIMARY SERVICE:  [facility]:  PCCM CODE STATUS: Full  DIET:  Full diet DVT Px:  SCD's GI Px:  N/a   HISTORY OF PRESENT ILLNESS:  52 yo HTN, DM, Hep C. Admitted 1/11 with dyspnea, cough, fever. CXR with RUL PNA. Progressive dyspnea, tachycardia, pulmonary infiltrates. CCM consulted 1/12. ARDS, septic shock  INTERVAL HISTORY:  Remains sinus tachy but better w sedation Pressors being weaned  VITAL SIGNS: Temp:  [97.2 F (36.2 C)-102.6 F (39.2 C)] 97.3 F (36.3 C) (01/17 0900) Pulse Rate:  [95-160] 104  (01/17 0900) Resp:  [28-46] 30  (01/17 0900) BP: (79-122)/(56-95) 88/63 mmHg (01/17 0900) SpO2:  [91 %-97 %] 93 % (01/17 0900) Arterial Line BP: (101-135)/(57-86) 108/66 mmHg (01/17 0900) FiO2 (%):  [30 %-40 %] 30 % (01/17 0800) Weight:  [62 kg (136 lb 11 oz)] 62 kg (136 lb 11 oz) (01/17 0500) HEMODYNAMICS: CVP:  [6 mmHg-12 mmHg] 11 mmHg VENTILATOR SETTINGS: Vent Mode:  [-] PRVC FiO2 (%):  [30 %-40 %] 30 % Set Rate:  [31 bmp] 31 bmp Vt Set:  [360 mL] 360 mL PEEP:  [5 cmH20] 5 cmH20 Plateau Pressure:  [20 cmH20-27 cmH20] 27 cmH20 INTAKE / OUTPUT: Intake/Output      01/16 0701 - 01/17 0700 01/17 0701 - 01/18 0700   I.V. (mL/kg) 1904.2 (30.7) 135 (2.2)   NG/GT 1050 180   IV Piggyback 435 170   Total Intake(mL/kg) 3389.2 (54.7) 485 (7.8)   Urine (mL/kg/hr) 1935 (1.3) 175   Total Output 1935 175   Net +1454.2 +310        Stool Occurrence 1 x      PHYSICAL EXAMINATION: General:  Ill appearing, on vent  Neuro:  moves all ext, RASS -3 to -2 HEENT:  OP moist Cardiovascular: Tachy 120-140's, regular, no M Lungs:  Coarse rhonchi B, diffuse crackles Abdomen:  +bs , on low flow tube feeds  Musculoskeletal:  No edema Skin:  No rash  LABS: Cbc  Lab 10/17/12 0335 10/16/12 0500 10/15/12 0812 10/15/12 0500  WBC 11.8* -- -- --  HGB 8.8* 9.0* -- 9.0*  HCT 24.4* 24.9* -- 24.5*  PLT 74* 76* 107* --    Chemistry   Lab 10/17/12 0335 10/16/12 0500 10/15/12 1245  NA 145145 147* 147*  K 3.2*3.4* 3.6 4.8  CL 117*117* 119* 120*  CO2 1919 18* 16*  BUN 1414 13 12  CREATININE 1.28*1.30* 1.26* 1.22*  CALCIUM 7.4*7.5* 7.4* 6.7*  MG 1.6 1.8 2.1  PHOS 2.0*1.9* 1.8* 2.7  GLUCOSE 109*113* 90 195*    Liver fxn  Lab 10/17/12 0335 10/16/12 0500 10/15/12 1245 10/14/12 1630 10/14/12 0305 10/13/12 2040  AST -- -- -- 46* 68* 70*  ALT -- -- -- 22 24 26   ALKPHOS -- -- -- 112 123* 169*  BILITOT -- -- -- 0.4 0.3 0.4  PROT -- -- -- 4.7* 5.4* 6.1  ALBUMIN  1.2* 1.4* 1.3* -- -- --   coags  Lab 10/17/12 0335 10/16/12 0500 10/15/12 0812  APTT -- -- 50*  INR 1.48 1.42 1.62*   Sepsis markers  Lab 10/14/12 1805 10/14/12 0305 10/13/12 0345 10/12/12 1439 10/12/12 1437 10/11/12 1934  LATICACIDVEN 2.5* -- -- 1.0 -- 1.5  PROCALCITON -- 3.60 4.83 -- 5.67 --   Cardiac markers  Lab 10/14/12 0914 10/14/12 0305 10/13/12 2040  CKTOTAL -- -- --  CKMB -- -- --  TROPONINI <0.30 <0.30 <0.30   BNP No results found for this basename: PROBNP:3 in the last 168 hours ABG  Lab 10/14/12 1352 10/14/12 1225 10/14/12 1100  PHART 7.386 7.344* 7.254*  PCO2ART 33.3* 35.8 47.2*  PO2ART 99.3 68.9* 81.2  HCO3 19.6* 18.9* 20.2  TCO2 17.9 17.5 18.9    CBG trend  Lab 10/17/12 0750 10/17/12 0312 10/16/12 2333 10/16/12 1945 10/16/12 1509  GLUCAP 144* 113* 220* 141* 139*    IMAGING: Dg Chest Port 1 View  10/17/2012   *RADIOLOGY REPORT*  Clinical Data: Airspace disease.  Ventilator patient.  PORTABLE CHEST - 1 VIEW  Comparison: 10/16/2012  Findings: Endotracheal tube tip measures 441 cm above the carina. Enteric tube tip is not visualized but is below left hemidiaphragm consistent with location in the distal stomach.  Left central venous catheter with tip overlying the mid SVC region.  Shallow inspiration.  Normal heart size.  A suggestion of pulmonary vascular congestion.  Diffuse interstitial pattern in the lungs could represent edema, ARDS, or preexisting fibrosis.  No pneumothorax.  Overall stable appearance since previous study.  IMPRESSION: No significant change since yesterday.  Appliances remain in satisfactory apparent location.  Diffuse interstitial disease with possible pulmonary vascular congestion.  Normal heart size.   Original Report Authenticated By: Burman Nieves, M.D.    Dg Chest Port 1 View  10/16/2012  *RADIOLOGY REPORT*  Clinical Data: Endotracheal tube.  PORTABLE CHEST - 1 VIEW  Comparison: 10/15/2012.  Findings: The endotracheal tube tip is 27 mm from the carina.  Left IJ central line and enteric tube remain present.  Diffuse bilateral airspace disease is present, without interval change compared yesterday.  Cardiopericardial silhouette and mediastinal contours are also unchanged.  IMPRESSION: No interval change.  Stable support apparatus.   Original Report Authenticated By: Andreas Newport, M.D.     ECG:  DIAGNOSES: Principal Problem:  *Sepsis Active Problems:  DIABETES MELLITUS, TYPE II  ANXIETY  DEPRESSION  ASTHMA  Community acquired pneumonia  Bilateral lower abdominal discomfort  H1N1 influenza  Acute respiratory failure  ARDS (adult respiratory distress syndrome)  Crohn's disease  Metabolic acidosis  Hypernatremia  Hypokalemia  Hypomagnesemia  Hypophosphatemia  Leukocytosis  Normocytic anemia  Thrombocytopenia  Elevated INR  Respiratory failure   ASSESSMENT /  PLAN:  PULMONARY  ASSESSMENT: Acute resp failure due to H1N1 pneumonitis vs ARDS RUL CAP  PLAN:   - Tamiflu, for at least 10 days - d/c'd vanco 1/14, continue aztreonam + azithro for 8-10 days - ARDS protocol 1/14 - BD's as ordered  CARDIOVASCULAR  ASSESSMENT:  Severe sepsis,septic shock, on low dose norepi Sinus tachycardia > likely due to her overall critical illness, but she also has hx narcotics use so consider withdrawal  PLAN:  - try to minimize volume resuscitation for now given ARDS -pressors as needed -push cvp to 10  RENAL  ASSESSMENT:   Acute renal failure, due to sepsis, stable Metabolic acidosis Lab Results  Component Value Date   CREATININE 1.28* 10/17/2012  CREATININE 1.30* 10/17/2012   CREATININE 1.26* 10/16/2012   CREATININE 1.22* 10/15/2012    Intake/Output Summary (Last 24 hours) at 10/17/12 0958 Last data filed at 10/17/12 0900  Gross per 24 hour  Intake 3699.74 ml  Output   1885 ml  Net 1814.74 ml   PLAN:   - follow BMP and UOP - d/c'd bicarb 1/14 and follow ABG - replete lytes as needed  GASTROINTESTINAL  ASSESSMENT:  abd pain, nausea, likely due to viral process PLAN:   - KUB neg 1/14  HEMATOLOGIC  ASSESSMENT:   PLAN:  - DVT prophylaxis  INFECTIOUS  ASSESSMENT:   RUL CAP on presentation CXR Influenza H1N1 positive Severe Sepsis PLAN:   - abx coverage for severe CAP, see flowsheet; as above, d/c'd vanco in absence evidence for community acquired MRSA - tamiflu x 10 days+  - 1/15 recultured for fever spike 101  ENDOCRINE  ASSESSMENT:  DM    PLAN:   - SSI protocol  NEUROLOGIC  ASSESSMENT:   Delirium / confusion on presentation, ? Toxic metabolic + hepatic Sedation currently for MV PLAN:   - continuous sedation protocol, decrease as resp status will allow -1/17 add low dose Seroquel per tube  CLINICAL SUMMARY: 52 yo HTN, DM, Hep C. Admitted 1/11 with dyspnea, cough, fever. CXR with RUL PNA. Progressive dyspnea,  tachycardia, pulmonary infiltrates > ARDS + Flu pneumonitis. CCM consulted 1/12. 1/14 ETT for ARDS. Septic shock.   Brett Canales Minor ACNP [facility] Pager [phone] till 3 pm If no answer page [phone] 10/17/2012, 9:58 AM  CC time 30 minutes  Levy Pupa, MD, PhD 10/17/2012, 11:05 AM [facility] [phone] or if no answer [phone]

## 2012-10-18 ENCOUNTER — Inpatient Hospital Stay (HOSPITAL_COMMUNITY): Payer: Medicare Other

## 2012-10-18 LAB — PROTIME-INR
INR: 1.33 (ref 0.00–1.49)
Prothrombin Time: 16.2 seconds — ABNORMAL HIGH (ref 11.6–15.2)

## 2012-10-18 LAB — CBC
HCT: 23 % — ABNORMAL LOW (ref 36.0–46.0)
Hemoglobin: 8 g/dL — ABNORMAL LOW (ref 12.0–15.0)
MCHC: 34.8 g/dL (ref 30.0–36.0)
WBC: 7.8 10*3/uL (ref 4.0–10.5)

## 2012-10-18 LAB — RENAL FUNCTION PANEL
Albumin: 1.2 g/dL — ABNORMAL LOW (ref 3.5–5.2)
BUN: 13 mg/dL (ref 6–23)
CO2: 19 mEq/L (ref 19–32)
Chloride: 111 mEq/L (ref 96–112)
Creatinine, Ser: 1.05 mg/dL (ref 0.50–1.10)
Glucose, Bld: 71 mg/dL (ref 70–99)
Potassium: 4.1 mEq/L (ref 3.5–5.1)

## 2012-10-18 LAB — STOOL CULTURE: Special Requests: NORMAL

## 2012-10-18 LAB — GLUCOSE, CAPILLARY
Glucose-Capillary: 121 mg/dL — ABNORMAL HIGH (ref 70–99)
Glucose-Capillary: 89 mg/dL (ref 70–99)

## 2012-10-18 LAB — MAGNESIUM: Magnesium: 1.3 mg/dL — ABNORMAL LOW (ref 1.5–2.5)

## 2012-10-18 LAB — PHOSPHORUS: Phosphorus: 3.4 mg/dL (ref 2.3–4.6)

## 2012-10-18 MED ORDER — FENTANYL CITRATE 0.05 MG/ML IJ SOLN
25.0000 ug | INTRAMUSCULAR | Status: DC | PRN
  Administered 2012-10-18: 100 ug via INTRAVENOUS
  Filled 2012-10-18: qty 2

## 2012-10-18 MED ORDER — CLONAZEPAM 0.1 MG/ML ORAL SUSPENSION
1.0000 mg | Freq: Two times a day (BID) | ORAL | Status: DC
  Filled 2012-10-18 (×2): qty 10

## 2012-10-18 MED ORDER — DEXTROSE 50 % IV SOLN: 25.0000 mL | Freq: Once | INTRAVENOUS | Status: AC | PRN

## 2012-10-18 MED ORDER — MIDAZOLAM HCL 5 MG/ML IJ SOLN
2.0000 mg | INTRAMUSCULAR | Status: DC | PRN
  Administered 2012-10-18 – 2012-10-19 (×3): 2 mg via INTRAVENOUS
  Filled 2012-10-18 (×3): qty 1

## 2012-10-18 MED ORDER — SODIUM CHLORIDE 0.9 % IV SOLN
25.0000 ug/h | INTRAVENOUS | Status: DC
  Administered 2012-10-18: 25 ug/h via INTRAVENOUS
  Filled 2012-10-18: qty 50

## 2012-10-18 MED ORDER — FENTANYL BOLUS VIA INFUSION
25.0000 ug | Freq: Four times a day (QID) | INTRAVENOUS | Status: DC | PRN
  Administered 2012-10-18: 25 ug via INTRAVENOUS
  Filled 2012-10-18: qty 100

## 2012-10-18 MED ORDER — DEXTROSE-NACL 5-0.45 % IV SOLN
INTRAVENOUS | Status: DC
  Administered 2012-10-18 – 2012-10-19 (×2): via INTRAVENOUS

## 2012-10-18 MED ORDER — DEXTROSE 50 % IV SOLN
1.0000 | Freq: Once | INTRAVENOUS | Status: AC
  Administered 2012-10-18: 50 mL via INTRAVENOUS
  Filled 2012-10-18: qty 50

## 2012-10-18 MED ORDER — CLONAZEPAM 1 MG PO TABS
1.0000 mg | ORAL_TABLET | Freq: Two times a day (BID) | ORAL | Status: DC
  Administered 2012-10-18 – 2012-10-19 (×3): 1 mg via ORAL
  Filled 2012-10-18 (×3): qty 1

## 2012-10-18 NOTE — Progress Notes (Signed)
Patient agitated, pulling at vent circuit with mittens on, attempting to get OOB and roll over on right side.  HR 150-160s, RR 40s.  Next Fentanyl dose not due for 1 hour.  Patient had large BM, cleaned up and linens changed.  Will continue to monitor.

## 2012-10-18 NOTE — Progress Notes (Signed)
Hypoglycemia;   Initiate D5.

## 2012-10-18 NOTE — Progress Notes (Signed)
Patient continuously attempting to get OOB.  Unable to follow commands at this time.  HR sustaining 150-160s.  Most recent BP 133/90.  ELink MD notified.  Will continue to monitor.

## 2012-10-18 NOTE — Progress Notes (Signed)
Cannot tolerate intermittent sedation; climbing out of bed and agitated; Has history of chronic back pain  Initiate Fentanyl drip and Versed intermitent

## 2012-10-18 NOTE — Progress Notes (Signed)
PULMONARY  / CRITICAL CARE MEDICINE  Name: [name] MRN: [id number] DOB: Oct 02, 1960    LOS: 7  REFERRING MD :  Cena Benton  CHIEF COMPLAINT:  Dyspnea, tachycardia  LINES / TUBES: 1-14 et >>  1-14 cvl >>  1-14  Fem a line >>   CULTURES: MRSA PCR 1/11 >> NEG H1N1 1/11 >> positive resp 1/11 >> normal flora Urine 1/11 >> negative C diff 1/14 >> NEG Stool cx 1/14 >> abundant yeast, reduced normal flora uc 1/15 >> neg Resp 1/15 >> Few candida bc x 2 1/15 >>  ANTIBIOTICS: Aztreonam 1/11 >> 1/19 azithro 1/11 >> 1/19 vanco 1/11 >>1/14 Oseltamivir 1/16 >>   INTERVAL HISTORY:  Intermittent agitation. Tried off continuous sedation 1/19 but didn't tolerate  VITAL SIGNS: Temp:  [98.2 F (36.8 C)-99.5 F (37.5 C)] 99.3 F (37.4 C) (01/18 1600) Pulse Rate:  [91-159] 108  (01/18 1600) Resp:  [21-34] 21  (01/18 1600) BP: (83-133)/(54-90) 91/67 mmHg (01/18 1600) SpO2:  [96 %-99 %] 99 % (01/18 1600) Arterial Line BP: (93-116)/(50-68) 99/56 mmHg (01/18 0000) FiO2 (%):  [30 %] 30 % (01/18 1600) Weight:  [52.1 kg (114 lb 13.8 oz)] 52.1 kg (114 lb 13.8 oz) (01/18 0500) HEMODYNAMICS: CVP:  [12 mmHg] 12 mmHg VENTILATOR SETTINGS: Vent Mode:  [-] PRVC FiO2 (%):  [30 %] 30 % Set Rate:  [14 bmp-31 bmp] 14 bmp Vt Set:  [360 mL-400 mL] 400 mL PEEP:  [5 cmH20] 5 cmH20 Plateau Pressure:  [7 cmH20-21 cmH20] 12 cmH20 INTAKE / OUTPUT: Intake/Output      01/17 0701 - 01/18 0700 01/18 0701 - 01/19 0700   I.V. (mL/kg) 1592.8 (30.6) 631 (12.1)   NG/GT 480    IV Piggyback 750 60   Total Intake(mL/kg) 2822.8 (54.2) 691 (13.3)   Urine (mL/kg/hr) 1875 (1.5) 650 (1.2)   Emesis/NG output 0 250   Total Output 1875 900   Net +947.8 -209          PHYSICAL EXAMINATION: General:  Ill appearing, on vent  Neuro:  moves all ext, RASS -3 to -2 HEENT: WNL Cardiovascular: Tachy, regular, no M Lungs:  Coarse rhonchi B, diffuse crackles Abdomen:  +bs, soft  Musculoskeletal:  No  edema   LABS: Cbc  Lab 10/18/12 0500 10/17/12 0335 10/16/12 0500  WBC 7.8 -- --  HGB 8.0* 8.8* 9.0*  HCT 23.0* 24.4* 24.9*  PLT 71* 74* 76*    Chemistry   Lab 10/18/12 0500 10/17/12 0335 10/16/12 0500  NA 139 145145 147*  K 4.1 3.2*3.4* 3.6  CL 111 117*117* 119*  CO2 19 1919 18*  BUN 13 1414 13  CREATININE 1.05 1.28*1.30* 1.26*  CALCIUM 7.5* 7.4*7.5* 7.4*  MG 1.3* 1.6 1.8  PHOS 3.43.4 2.0*1.9* 1.8*  GLUCOSE 71 109*113* 90    Liver fxn  Lab 10/18/12 0500 10/17/12 0335 10/16/12 0500 10/14/12 1630 10/14/12 0305 10/13/12 2040  AST -- -- -- 46* 68* 70*  ALT -- -- -- 22 24 26   ALKPHOS -- -- -- 112 123* 169*  BILITOT -- -- -- 0.4 0.3 0.4  PROT -- -- -- 4.7* 5.4* 6.1  ALBUMIN 1.2* 1.2* 1.4* -- -- --   coags  Lab 10/18/12 0500 10/17/12 0335 10/16/12 0500 10/15/12 0812  APTT -- -- -- 50*  INR 1.33 1.48 1.42 --   Sepsis markers  Lab 10/14/12 1805 10/14/12 0305 10/13/12 0345 10/12/12 1439 10/12/12 1437 10/11/12 1934  LATICACIDVEN 2.5* -- -- 1.0 -- 1.5  PROCALCITON --  3.60 4.83 -- 5.67 --   Cardiac markers  Lab 10/14/12 0914 10/14/12 0305 10/13/12 2040  CKTOTAL -- -- --  CKMB -- -- --  TROPONINI <0.30 <0.30 <0.30   BNP No results found for this basename: PROBNP:3 in the last 168 hours ABG  Lab 10/14/12 1352 10/14/12 1225 10/14/12 1100  PHART 7.386 7.344* 7.254*  PCO2ART 33.3* 35.8 47.2*  PO2ART 99.3 68.9* 81.2  HCO3 19.6* 18.9* 20.2  TCO2 17.9 17.5 18.9    CBG trend  Lab 10/18/12 1604 10/18/12 0812 10/18/12 0417 10/18/12 0012 10/17/12 2006  GLUCAP 63* 72 89 121* 73    CXR: interstitial prominence bilaterally  ECG:  DIAGNOSES: Principal Problem:  *Sepsis Active Problems:  DIABETES MELLITUS, TYPE II  ANXIETY  DEPRESSION  ASTHMA  Community acquired pneumonia  Bilateral lower abdominal discomfort  H1N1 influenza  Acute respiratory failure  ARDS (adult respiratory distress syndrome)  Crohn's disease  Metabolic acidosis   Hypernatremia  Hypokalemia  Hypomagnesemia  Hypophosphatemia  Leukocytosis  Normocytic anemia  Thrombocytopenia  Elevated INR  Respiratory failure   ASSESSMENT / PLAN:  PULMONARY  ASSESSMENT: Acute resp failure due to H1N1 pneumonitis vs ARDS RUL CAP  PLAN:   - Tamiflu, for at least 10 days - D/C antibacterials - Vent changes made - Cont BD's as ordered  CARDIOVASCULAR  ASSESSMENT:  Severe sepsis,septic shock, on low dose norepi Sinus tachycardia > likely due to her overall critical illness, but she also has hx narcotics use so consider withdrawal  PLAN:  - Wean pressors as tolerated for MAP goal 65 mmHg  RENAL  ASSESSMENT:   Acute renal failure, due to sepsis, stable Metabolic acidosis, resolved PLAN:   - follow BMP and UOP - replete lytes as needed  GASTROINTESTINAL  ASSESSMENT: Cirrhosis  Hypoalbuminemia PLAN:   - Cont TFs   HEMATOLOGIC  ASSESSMENT:  ICU acquired anemia  No evidence of acute blood loss PLAN:  - Monitor CBC intermittently and transfuse RBCs for Hgb < 7.0 or acute blood loss   INFECTIOUS  ASSESSMENT:   RUL CAP on presentation CXR Influenza H1N1 positive Severe Sepsis PLAN:   - D/C antibacterials - Cont oseltamivir for 10 days   ENDOCRINE  ASSESSMENT:  DM     Episodic hypoglycemia PLAN:   - D/C SSI, resume for CBG > 180 - Cont CBGs   NEUROLOGIC  ASSESSMENT:   ICU acquired Delirium PLAN:   - Cont fentanyl gtt and PRN midaz  CCM X 40 mins  Billy Fischer, MD ; [facility] service Mobile [phone].  After 5:30 PM or weekends, call [phone]

## 2012-10-19 ENCOUNTER — Inpatient Hospital Stay (HOSPITAL_COMMUNITY): Payer: Medicare Other

## 2012-10-19 LAB — BASIC METABOLIC PANEL
BUN: 11 mg/dL (ref 6–23)
CO2: 21 mEq/L (ref 19–32)
Chloride: 110 mEq/L (ref 96–112)
Creatinine, Ser: 0.89 mg/dL (ref 0.50–1.10)
GFR calc Af Amer: 84 mL/min — ABNORMAL LOW (ref >90)
Potassium: 3.3 mEq/L — ABNORMAL LOW (ref 3.5–5.1)

## 2012-10-19 LAB — GLUCOSE, CAPILLARY
Glucose-Capillary: 103 mg/dL — ABNORMAL HIGH (ref 70–99)
Glucose-Capillary: 164 mg/dL — ABNORMAL HIGH (ref 70–99)
Glucose-Capillary: 92 mg/dL (ref 70–99)

## 2012-10-19 LAB — CBC
HCT: 23.9 % — ABNORMAL LOW (ref 36.0–46.0)
Hemoglobin: 7.9 g/dL — ABNORMAL LOW (ref 12.0–15.0)
MCV: 87.9 fL (ref 78.0–100.0)
RDW: 15.7 % — ABNORMAL HIGH (ref 11.5–15.5)
WBC: 8.7 10*3/uL (ref 4.0–10.5)

## 2012-10-19 MED ORDER — THIAMINE HCL 100 MG/ML IJ SOLN
100.0000 mg | Freq: Every day | INTRAMUSCULAR | Status: DC
  Administered 2012-10-20 – 2012-10-21 (×2): 100 mg via INTRAVENOUS
  Filled 2012-10-19 (×2): qty 1

## 2012-10-19 MED ORDER — HALOPERIDOL LACTATE 5 MG/ML IJ SOLN: 1.0000 mg | Freq: Four times a day (QID) | INTRAMUSCULAR | Status: DC | PRN

## 2012-10-19 MED ORDER — FOLIC ACID 5 MG/ML IJ SOLN
1.0000 mg | Freq: Every day | INTRAMUSCULAR | Status: DC
  Administered 2012-10-20 – 2012-10-21 (×2): 1 mg via INTRAVENOUS
  Filled 2012-10-19 (×3): qty 0.2

## 2012-10-19 MED ORDER — KCL IN DEXTROSE-NACL 40-5-0.45 MEQ/L-%-% IV SOLN
INTRAVENOUS | Status: DC
  Administered 2012-10-19: 1000 mL via INTRAVENOUS
  Filled 2012-10-19 (×3): qty 1000

## 2012-10-19 MED ORDER — FENTANYL CITRATE 0.05 MG/ML IJ SOLN: 25.0000 ug | INTRAMUSCULAR | Status: DC | PRN

## 2012-10-19 MED ORDER — LORAZEPAM 2 MG/ML IJ SOLN
0.5000 mg | INTRAMUSCULAR | Status: DC | PRN
  Administered 2012-10-19: 1 mg via INTRAVENOUS
  Filled 2012-10-19: qty 1

## 2012-10-19 NOTE — Progress Notes (Signed)
PULMONARY  / CRITICAL CARE MEDICINE  Name: [name] MRN: [id number] DOB: 1961/04/25    LOS: 8  REFERRING MD :  Cena Benton  CHIEF COMPLAINT:  Dyspnea, tachycardia  LINES / TUBES: 1-14 et >> 1/19 1-14 cvl >>  1-14  Fem a line >> out  CULTURES: MRSA PCR 1/11 >> NEG H1N1 1/11 >> positive resp 1/11 >> normal flora Urine 1/11 >> negative C diff 1/14 >> NEG Stool cx 1/14 >> abundant yeast, reduced normal flora uc 1/15 >> neg Resp 1/15 >> Few candida bc x 2 1/15 >>   ANTIBIOTICS: Aztreonam 1/11 >> 1/19 azithro 1/11 >> 1/19 vanco 1/11 >>1/14 Oseltamivir 1/16 >> 1/19  INTERVAL HISTORY:  RASS -1 to -2. Passed SBT. Not F/C. Extubated and tolerating but remains lethargic. Spontaneous cough  VITAL SIGNS: Temp:  [98.4 F (36.9 C)-99.3 F (37.4 C)] 98.4 F (36.9 C) (01/19 1000) Pulse Rate:  [97-122] 109  (01/19 1419) Resp:  [16-25] 25  (01/19 1419) BP: (91-137)/(60-87) 120/79 mmHg (01/19 1419) SpO2:  [96 %-100 %] 97 % (01/19 1419) FiO2 (%):  [30 %] 30 % (01/19 0907) Weight:  [48.2 kg (106 lb 4.2 oz)] 48.2 kg (106 lb 4.2 oz) (01/19 0500) HEMODYNAMICS: CVP:  [6 mmHg-11 mmHg] 8 mmHg VENTILATOR SETTINGS: Vent Mode:  [-] CPAP;PSV FiO2 (%):  [30 %] 30 % Set Rate:  [14 bmp] 14 bmp Vt Set:  [400 mL] 400 mL PEEP:  [5 cmH20] 5 cmH20 Pressure Support:  [10 cmH20] 10 cmH20 Plateau Pressure:  [12 cmH20-18 cmH20] 18 cmH20 INTAKE / OUTPUT: Intake/Output      01/18 0701 - 01/19 0700 01/19 0701 - 01/20 0700   I.V. (mL/kg) 2103.5 (43.6) 410 (8.5)   NG/GT     IV Piggyback 60    Total Intake(mL/kg) 2163.5 (44.9) 410 (8.5)   Urine (mL/kg/hr) 2025 (1.8) 950 (2.3)   Emesis/NG output 450    Total Output 2475 950   Net -311.5 -540        Stool Occurrence 1 x      PHYSICAL EXAMINATION: General: RASS -2. Not F/C. No distress  Neuro:  moves all ext HEENT: WNL Cardiovascular: Tachy, regular, no M Lungs: few rales Abdomen:  +bs, soft  Musculoskeletal:  No edema   LABS: Cbc  Lab  10/19/12 0630 10/18/12 0500 10/17/12 0335  WBC 8.7 -- --  HGB 7.9* 8.0* 8.8*  HCT 23.9* 23.0* 24.4*  PLT 107* 71* 74*    Chemistry   Lab 10/19/12 0630 10/18/12 0500 10/17/12 0335 10/16/12 0500  NA 141 139 145145 --  K 3.3* 4.1 3.2*3.4* --  CL 110 111 117*117* --  CO2 21 19 1919 --  BUN 11 13 1414 --  CREATININE 0.89 1.05 1.28*1.30* --  CALCIUM 7.6* 7.5* 7.4*7.5* --  MG -- 1.3* 1.6 1.8  PHOS -- 3.43.4 2.0*1.9* 1.8*  GLUCOSE 94 71 109*113* --    Liver fxn  Lab 10/18/12 0500 10/17/12 0335 10/16/12 0500 10/14/12 1630 10/14/12 0305 10/13/12 2040  AST -- -- -- 46* 68* 70*  ALT -- -- -- 22 24 26   ALKPHOS -- -- -- 112 123* 169*  BILITOT -- -- -- 0.4 0.3 0.4  PROT -- -- -- 4.7* 5.4* 6.1  ALBUMIN 1.2* 1.2* 1.4* -- -- --   coags  Lab 10/18/12 0500 10/17/12 0335 10/16/12 0500 10/15/12 0812  APTT -- -- -- 50*  INR 1.33 1.48 1.42 --   Sepsis markers  Lab 10/19/12 0630 10/14/12 1805 10/14/12 0305  10/13/12 0345  LATICACIDVEN -- 2.5* -- --  PROCALCITON 1.45 -- 3.60 4.83   Cardiac markers  Lab 10/14/12 0914 10/14/12 0305 10/13/12 2040  CKTOTAL -- -- --  CKMB -- -- --  TROPONINI <0.30 <0.30 <0.30   BNP No results found for this basename: PROBNP:3 in the last 168 hours ABG  Lab 10/14/12 1352 10/14/12 1225 10/14/12 1100  PHART 7.386 7.344* 7.254*  PCO2ART 33.3* 35.8 47.2*  PO2ART 99.3 68.9* 81.2  HCO3 19.6* 18.9* 20.2  TCO2 17.9 17.5 18.9    CBG trend  Lab 10/19/12 0511 10/19/12 0018 10/18/12 2016 10/18/12 1736 10/18/12 1604  GLUCAP 108* 103* 92 164* 63*    CXR: NSC interstitial prominence bilaterally  ECG:  DIAGNOSES: Principal Problem:  *Sepsis Active Problems:  DIABETES MELLITUS, TYPE II  ANXIETY  DEPRESSION  ASTHMA  Community acquired pneumonia  Bilateral lower abdominal discomfort  H1N1 influenza  Acute respiratory failure  ARDS (adult respiratory distress syndrome)  Crohn's disease  Metabolic acidosis  Hypernatremia  Hypokalemia   Hypomagnesemia  Hypophosphatemia  Leukocytosis  Normocytic anemia  Thrombocytopenia  Elevated INR  Respiratory failure   ASSESSMENT / PLAN:  PULMONARY  ASSESSMENT: Acute resp failure due to H1N1 pneumonitis vs ARDS RUL CAP  PLAN:   - D/C Tamiflu - Monitor off all abx - post extubation care in ICU  CARDIOVASCULAR  ASSESSMENT:  Severe sepsis,septic shock - resolved Sinus tachycardia, resolved Off pressors 10/18/12 PLAN:  - Monitor  RENAL  ASSESSMENT:   Acute renal failure, resolved Metabolic acidosis, resolved PLAN:   - follow BMP and UOP - replete lytes as needed  GASTROINTESTINAL  ASSESSMENT: Cirrhosis  Hypoalbuminemia PLAN:   - NPO after extubation  HEMATOLOGIC  ASSESSMENT:  ICU acquired anemia  No evidence of acute blood loss PLAN:  - Monitor CBC intermittently and transfuse RBCs for Hgb < 7.0 or acute blood loss   INFECTIOUS  ASSESSMENT:   RUL CAP on presentation CXR Influenza H1N1 positive Severe Sepsis PLAN:   - monitor of antimicrobials   ENDOCRINE  ASSESSMENT:  DM     Episodic hypoglycemia PLAN:   - D/C SSI, resume for CBG > 180 - Cont CBGs   NEUROLOGIC  ASSESSMENT:   ICU acquired Delirium PLAN:   - D/C all sedatives  CCM X 30 mins  Billy Fischer, MD ; [facility] service Mobile [phone].  After 5:30 PM or weekends, call [phone]

## 2012-10-19 NOTE — Progress Notes (Signed)
Patient nods head yes and not.  Encouraged to speak but has not spoken.  Patient clamped lips together and would not allow mouth care at 1600. Now resting quietly.  Dr. Sung Amabile notified by am RN, that patient has not yet spoken.  Will continue to monitor.

## 2012-10-19 NOTE — Progress Notes (Signed)
Pt extubated to 2L at 1025.  Rhonchus BBS, hr 127, sats 97%, rr 20, bp 118/85.  No Stridor, no distress, RT will cont to monitor.

## 2012-10-20 LAB — BASIC METABOLIC PANEL
CO2: 23 mEq/L (ref 19–32)
Chloride: 112 mEq/L (ref 96–112)
Creatinine, Ser: 0.67 mg/dL (ref 0.50–1.10)
Sodium: 145 mEq/L (ref 135–145)

## 2012-10-20 LAB — GLUCOSE, CAPILLARY: Glucose-Capillary: 86 mg/dL (ref 70–99)

## 2012-10-20 LAB — CBC
HCT: 23.9 % — ABNORMAL LOW (ref 36.0–46.0)
MCV: 89.2 fL (ref 78.0–100.0)
RBC: 2.68 MIL/uL — ABNORMAL LOW (ref 3.87–5.11)
WBC: 11.1 10*3/uL — ABNORMAL HIGH (ref 4.0–10.5)

## 2012-10-20 LAB — PROCALCITONIN: Procalcitonin: 0.48 ng/mL

## 2012-10-20 MED ORDER — HYDROMORPHONE HCL 2 MG PO TABS
1.0000 mg | ORAL_TABLET | ORAL | Status: DC | PRN
  Administered 2012-10-20 – 2012-10-24 (×17): 1 mg via ORAL
  Filled 2012-10-20 (×17): qty 1

## 2012-10-20 MED ORDER — POTASSIUM CHLORIDE 10 MEQ/50ML IV SOLN
10.0000 meq | INTRAVENOUS | Status: AC
  Administered 2012-10-20 (×4): 10 meq via INTRAVENOUS
  Filled 2012-10-20 (×4): qty 50

## 2012-10-20 NOTE — Progress Notes (Signed)
[id number]/Rhonda Davis, RN, BSN, CCM:  CHART REVIEWED AND UPDATED.  Next chart review due on [id number]. NO DISCHARGE NEEDS PRESENT AT THIS TIME. CASE MANAGEMENT [phone] 

## 2012-10-20 NOTE — Progress Notes (Signed)
Nursing bedside swallow evaluation, pt had no problems with ice chips, small sips of water, apple sauce or ice cream. Was also able to take PO medication as well.   Verbalized that she wanted more ice cream then ask for dilaudid.

## 2012-10-20 NOTE — Progress Notes (Signed)
Severe diarrhea. C diff neg 1/14. Also passed RN bedside swallow eval and is asking for chocolate ice cream  Rectal tube Rounding team to decide whether repeat C diff is indicated Advance diet as tolerated

## 2012-10-20 NOTE — Progress Notes (Signed)
PULMONARY  / CRITICAL CARE MEDICINE  Name: [name] MRN: [id number] DOB: Jul 02, 1961    LOS: 9  REFERRING MD :  Cena Benton  CHIEF COMPLAINT:  Dyspnea, tachycardia  LINES / TUBES: 1-14 et >> 1/19 1-14 cvl >>  1-14  Fem a line >> out  CULTURES: MRSA PCR 1/11 >> NEG H1N1 1/11 >> positive resp 1/11 >> normal flora Urine 1/11 >> negative C diff 1/14 >> NEG Stool cx 1/14 >> abundant yeast, reduced normal flora uc 1/15 >> neg Resp 1/15 >> Few candida bc x 2 1/15 >>   ANTIBIOTICS: Aztreonam 1/11 >> 1/19 azithro 1/11 >> 1/19 vanco 1/11 >>1/14 Oseltamivir 1/16 >> 1/20  INTERVAL HISTORY:  RASS -1 to -2. Passed SBT. Not F/C. Extubated and tolerating but remains lethargic. Spontaneous cough  VITAL SIGNS: Temp:  [98.4 F (36.9 C)-100.9 F (38.3 C)] 100.8 F (38.2 C) (01/20 0700) Pulse Rate:  [31-122] 107  (01/20 0700) Resp:  [17-29] 24  (01/20 0700) BP: (105-140)/(69-98) 140/94 mmHg (01/20 0800) SpO2:  [95 %-100 %] 95 % (01/20 0700) FiO2 (%):  [30 %] 30 % (01/19 0907) Room air  HEMODYNAMICS: CVP:  [8 mmHg] 8 mmHg VENTILATOR SETTINGS: Vent Mode:  [-] CPAP;PSV FiO2 (%):  [30 %] 30 % PEEP:  [5 cmH20] 5 cmH20 Pressure Support:  [10 cmH20] 10 cmH20 INTAKE / OUTPUT: Intake/Output      01/19 0701 - 01/20 0700 01/20 0701 - 01/21 0700   I.V. (mL/kg) 1590 (33)    IV Piggyback 60    Total Intake(mL/kg) 1650 (34.2)    Urine (mL/kg/hr) 2925 (2.5)    Emesis/NG output     Stool 4    Total Output 2929    Net -1279           PHYSICAL EXAMINATION: General: RASS -2. Not F/C. No distress  Neuro:  moves all ext HEENT: WNL Cardiovascular: Tachy, regular, no M Lungs: fine crackles in bases.  Abdomen:  +bs, soft  Musculoskeletal:  No edema   LABS: Cbc  Lab 10/20/12 0400 10/19/12 0630 10/18/12 0500  WBC 11.1* -- --  HGB 8.1* 7.9* 8.0*  HCT 23.9* 23.9* 23.0*  PLT 168 107* 71*    Chemistry   Lab 10/20/12 0400 10/19/12 0630 10/18/12 0500 10/17/12 0335 10/16/12 0500  NA  145 141 139 -- --  K 3.1* 3.3* 4.1 -- --  CL 112 110 111 -- --  CO2 23 21 19  -- --  BUN 7 11 13  -- --  CREATININE 0.67 0.89 1.05 -- --  CALCIUM 7.2* 7.6* 7.5* -- --  MG -- -- 1.3* 1.6 1.8  PHOS -- -- 3.43.4 2.0*1.9* 1.8*  GLUCOSE 107* 94 71 -- --    Sepsis markers  Lab 10/20/12 0400 10/19/12 0630 10/14/12 1805 10/14/12 0305  LATICACIDVEN -- -- 2.5* --  PROCALCITON 0.48 1.45 -- 3.60    Lab 10/20/12 0751 10/20/12 0002 10/19/12 1628 10/19/12 0511 10/19/12 0018  GLUCAP 86 111* 97 108* 103*    CXR: NSC interstitial prominence bilaterally  ECG:  DIAGNOSES: Principal Problem:  *Sepsis Active Problems:  DIABETES MELLITUS, TYPE II  ANXIETY  DEPRESSION  ASTHMA  Community acquired pneumonia  Bilateral lower abdominal discomfort  H1N1 influenza  Acute respiratory failure  ARDS (adult respiratory distress syndrome)  Crohn's disease  Metabolic acidosis  Hypernatremia  Hypokalemia  Hypomagnesemia  Hypophosphatemia  Leukocytosis  Normocytic anemia  Thrombocytopenia  Elevated INR  Respiratory failure   ASSESSMENT / PLAN:   H1N1 pneumonitis  vs ARDS, Clinically and  radiographically much improved.  RUL CAP s/p rx  PLAN:   - Monitor off all abx -complete 5d tamiflu  - post extubation care in ICU  Cirrhosis Hypoalbuminemia Chronic pancreatitis  PLAN:   -advance diet as tol -resume conservative analgesia regimen   acquired anemia No evidence of acute blood loss PLAN:  - Monitor CBC intermittently and transfuse RBCs for Hgb < 7.0 or acute blood loss   DM    Episodic hypoglycemia PLAN:   - D/C'd SSI, resume for CBG > 180 - Cont CBGs  ICU acquired Delirium She is still not at baseline but slowly improving Deconditioning s/p critical illness.  PLAN:   -supportive care -PT/OT consult.   Resolved problem list Acute resp failure  Severe sepsis,septic shock - resolved Sinus tachycardia, resolved Acute renal failure, resolved Metabolic acidosis,  resolved    STAFF NOTE: I, Dr Lavinia Sharps have personally reviewed patient's available data, including medical history, events of note, physical examination and test results as part of my evaluation. I have discussed with resident/NP and other care providers such as pharmacist, RN and RRT.  In addition,  I personally evaluated patient and elicited key findings of acute reso failure and sepsis resolved. She is deconditioned and has pain needs. Will move to floor under [facility]  [facility] per NP/medical resident whose note is outlined above and that I agree with     Dr. Kalman Shan, M.D., [facility].C.P Pulmonary and Critical Care Medicine Staff Physician [facility]  Pulmonary and Critical Care Pager: [phone], If no answer or between  15:00h - 7:00h: call [phone]  10/20/2012 5:26 PM

## 2012-10-21 ENCOUNTER — Inpatient Hospital Stay (HOSPITAL_COMMUNITY): Payer: Medicare Other

## 2012-10-21 DIAGNOSIS — G9340 Encephalopathy, unspecified: Secondary | ICD-10-CM | POA: Diagnosis present

## 2012-10-21 LAB — CULTURE, BLOOD (ROUTINE X 2): Culture: NO GROWTH

## 2012-10-21 LAB — GLUCOSE, CAPILLARY
Glucose-Capillary: 101 mg/dL — ABNORMAL HIGH (ref 70–99)
Glucose-Capillary: 85 mg/dL (ref 70–99)

## 2012-10-21 MED ORDER — VITAMIN B-1 100 MG PO TABS
100.0000 mg | ORAL_TABLET | Freq: Every day | ORAL | Status: DC
  Administered 2012-10-22 – 2012-10-24 (×3): 100 mg via ORAL
  Filled 2012-10-21 (×3): qty 1

## 2012-10-21 MED ORDER — POTASSIUM CHLORIDE CRYS ER 20 MEQ PO TBCR
20.0000 meq | EXTENDED_RELEASE_TABLET | Freq: Two times a day (BID) | ORAL | Status: DC
  Administered 2012-10-21 – 2012-10-23 (×6): 20 meq via ORAL
  Filled 2012-10-21 (×8): qty 1

## 2012-10-21 MED ORDER — FOLIC ACID 1 MG PO TABS
1.0000 mg | ORAL_TABLET | Freq: Every day | ORAL | Status: DC
  Administered 2012-10-22 – 2012-10-24 (×3): 1 mg via ORAL
  Filled 2012-10-21 (×3): qty 1

## 2012-10-21 MED ORDER — ENSURE COMPLETE PO LIQD
237.0000 mL | Freq: Two times a day (BID) | ORAL | Status: DC
  Administered 2012-10-22: 237 mL via ORAL

## 2012-10-21 NOTE — Progress Notes (Signed)
[facility] PROGRESS NOTE  [name] WUJ:[id number] DOB: 1961-03-10 DOA: 10/11/2012 PCP: Jeri Cos, MD  Brief narrative: [name] is a 52 year old woman with past medical history of diabetes, hypertension, hepatitis C, chronic pain syndrome and pancreatitis who was admitted on 10/11/2012 with severe sepsis secondary to H1N1 pneumonitis versus ARDS, ventilatory dependent respiratory failure (intubated 10/14/2012---> 10/19/2012), under the care of the critical care team since 10/12/2012, transferred back to the hospitalist service on 10/21/2012.  Assessment/Plan: Principal Problem:  *Severe sepsis with septic shock, status post ventilatory dependent respiratory failure  Respiratory cultures grew normal flora. Influenza studies for H1N1 were positive (status post 5 days of therapy with Tamiflu). She has also completed a course of aztreonam, and azithromycin, and vancomycin for healthcare associated pneumonia. Blood cultures have been negative to date.  Intubated 10/14/2012-10/19/2012 for acute respiratory failure secondary to influenza associated ARDS. Active Problems: Encephalopathy  The patient's mental status appears to be impaired. She is very slow to respond. She has a history of chronic opiate dependency. We'll get a CT scan of the patient's head.  Diarrhea  Has rectal tube in place. C. difficile was negative 10/14/2012. Would repeat given recent broad-spectrum antibiotic use.  DIABETES MELLITUS, TYPE II  CBGs 86-111. Not currently on any therapy. Hemoglobin A1c 5.5 on 10/11/2012, so unclear if the patient truly has a diagnosis of diabetes. Hemoglobin A1c range 5.4-5.6 consistently since 04/2007.  Home dose of Amaryl remains on hold.  ANXIETY / DEPRESSION  On Elavil, and Xanax at home. These medications are currently on hold.  ASTHMA  Monitor for bronchospasm.  Community acquired versus healthcare associated pneumonia  Completed a course of therapy with aztreonam, 8  azithromycin, and vancomycin.  Bilateral lower abdominal discomfort / history of Crohn's disease  Monitor. Treat symptomatically.  H1N1 influenza  Status post Tamiflu x5 days. To be completed today.  Acute respiratory failure / ARDS (adult respiratory distress syndrome)  Intubated 10/14/2012 until 10/19/2012.  Metabolic acidosis  Secondary to sepsis. Resolved.  Hypernatremia  Likely from dehydration. Resolved.  Hypokalemia  Monitor and replace as needed.  Hypomagnesemia  Monitor and replace as needed.  Hypophosphatemia  Resolved.  Leukocytosis  Secondary to infection. White blood cell count trending down over time.  Normocytic anemia  Likely anemia of acute illness. No current indication for transfusion.  Thrombocytopenia  Likely secondary to sepsis. No evidence of acute bleeding problems.  Elevated INR  Likely secondary to sepsis. No evidence of acute bleeding problems  Code Status: Full. Family Communication: No family at bedside. Disposition Plan: Suspect will need SNF.   Medical Consultants:  PCCM  Other Consultants:  Physical therapy  Occupational therapy  Anti-infectives:  Aztreonam 1/11 >> 1/19   Azithro 1/11 >> 1/19   Vanco 1/11 >>1/14   Oseltamivir 1/16 >> 1/20  HPI/Subjective: [name] is extremely slow to respond to my questions. She has been drug-seeking according to nursing staff, often asking for Dilaudid-HP by name. She stares off into space. Her baseline level of functioning is unclear. Denies dyspnea. Has a rectal tube inserted for diarrhea. Appetite is fair.  Objective: Filed Vitals:   10/20/12 1400 10/20/12 1619 10/20/12 2239 10/21/12 0500  BP:  131/96 121/81 139/94  Pulse: 109 125 111 117  Temp: 100.8 F (38.2 C) 98.5 F (36.9 C) 99.2 F (37.3 C) 98.1 F (36.7 C)  TempSrc:  Oral Oral Oral  Resp: 28 20 18 18   Height:  5\' 3"  (1.6 m)    Weight:  46.7 kg (102  lb 15.3 oz)    SpO2: 100% 100% 98% 100%    Intake/Output  Summary (Last 24 hours) at 10/21/12 1204 Last data filed at 10/21/12 0500  Gross per 24 hour  Intake    130 ml  Output   3200 ml  Net  -3070 ml    Exam: Gen:  Slow to respond. Cardiovascular:  Tachycardic Respiratory:  Lungs diminished Gastrointestinal:  Abdomen soft, NT/ND, + BS Extremities:  No C/E/C  Data Reviewed: Basic Metabolic Panel:  Lab 10/20/12 1610 10/19/12 0630 10/18/12 0500 10/17/12 0335 10/16/12 0500 10/15/12 1245 10/15/12 0500  NA 145 141 139 145145 147* -- --  K 3.1* 3.3* -- -- -- -- --  CL 112 110 111 117*117* 119* -- --  CO2 23 21 19  1919 18* -- --  GLUCOSE 107* 94 71 109*113* 90 -- --  BUN 7 11 13  1414 13 -- --  CREATININE 0.67 0.89 1.05 1.28*1.30* 1.26* -- --  CALCIUM 7.2* 7.6* 7.5* 7.4*7.5* 7.4* -- --  MG -- -- 1.3* 1.6 1.8 2.1 1.6  PHOS -- -- 3.43.4 2.0*1.9* 1.8* 2.7 1.1*   GFR Estimated Creatinine Clearance: 60 ml/min (by C-G formula based on Cr of 0.67). Liver Function Tests:  Lab 10/18/12 0500 10/17/12 0335 10/16/12 0500 10/15/12 1245 10/14/12 1630  AST -- -- -- -- 46*  ALT -- -- -- -- 22  ALKPHOS -- -- -- -- 112  BILITOT -- -- -- -- 0.4  PROT -- -- -- -- 4.7*  ALBUMIN 1.2* 1.2* 1.4* 1.3* 1.5*   Coagulation profile  Lab 10/18/12 0500 10/17/12 0335 10/16/12 0500 10/15/12 0812 10/15/12 0500  INR 1.33 1.48 1.42 1.62* 1.55*  PROTIME -- -- -- -- --    CBC:  Lab 10/20/12 0400 10/19/12 0630 10/18/12 0500 10/17/12 0335 10/16/12 0500  WBC 11.1* 8.7 7.8 11.8* 15.2*  NEUTROABS -- -- -- -- --  HGB 8.1* 7.9* 8.0* 8.8* 9.0*  HCT 23.9* 23.9* 23.0* 24.4* 24.9*  MCV 89.2 87.9 82.7 81.9 80.6  PLT 168 107* 71* 74* 76*   CBG:  Lab 10/21/12 0815 10/20/12 2342 10/20/12 0751 10/20/12 0002 10/19/12 1628  GLUCAP 101* 98 86 111* 97   Microbiology Recent Results (from the past 240 hour(s))  CULTURE, BLOOD (ROUTINE X 2)     Status: Normal   Collection Time   10/11/12  7:34 PM      Component Value Range Status Comment   Specimen Description  BLOOD LEFT ARM  5 ML IN [facility] BOTTLE   Final    Special Requests NONE   Final    Culture  Setup Time 10/11/2012 23:00   Final    Culture NO GROWTH 5 DAYS   Final    Report Status 10/17/2012 FINAL   Final   CULTURE, BLOOD (ROUTINE X 2)     Status: Normal   Collection Time   10/11/12  7:45 PM      Component Value Range Status Comment   Specimen Description BLOOD LEFT HAND  3 ML IN AEROBIC ONLY   Final    Special Requests NONE   Final    Culture  Setup Time 10/11/2012 22:59   Final    Culture NO GROWTH 5 DAYS   Final    Report Status 10/17/2012 FINAL   Final   URINE CULTURE     Status: Normal   Collection Time   10/11/12  8:01 PM      Component Value Range Status Comment   Specimen Description  URINE, CLEAN CATCH   Final    Special Requests NONE   Final    Culture  Setup Time 10/11/2012 23:03   Final    Colony Count 95,000 COLONIES/ML   Final    Culture     Final    Value: Multiple bacterial morphotypes present, none predominant. Suggest appropriate recollection if clinically indicated.   Report Status 10/12/2012 FINAL   Final   MRSA PCR SCREENING     Status: Normal   Collection Time   10/11/12 11:27 PM      Component Value Range Status Comment   MRSA by PCR NEGATIVE  NEGATIVE Final   CULTURE, EXPECTORATED SPUTUM-ASSESSMENT     Status: Normal   Collection Time   10/12/12  4:04 AM      Component Value Range Status Comment   Specimen Description SPUTUM   Final    Special Requests Normal   Final    Sputum evaluation     Final    Value: THIS SPECIMEN IS ACCEPTABLE. RESPIRATORY CULTURE REPORT TO FOLLOW.   Report Status 10/12/2012 FINAL   Final   CULTURE, RESPIRATORY     Status: Normal   Collection Time   10/12/12  4:04 AM      Component Value Range Status Comment   Specimen Description SPUTUM   Final    Special Requests NONE   Final    Gram Stain     Final    Value: FEW WBC PRESENT,BOTH PMN AND MONONUCLEAR     FEW SQUAMOUS EPITHELIAL CELLS PRESENT     RARE GRAM POSITIVE COCCI     IN  PAIRS   Culture NORMAL OROPHARYNGEAL FLORA   Final    Report Status 10/14/2012 FINAL   Final   CLOSTRIDIUM DIFFICILE BY PCR     Status: Normal   Collection Time   10/14/12  2:41 AM      Component Value Range Status Comment   C difficile by pcr NEGATIVE  NEGATIVE Final   STOOL CULTURE     Status: Normal   Collection Time   10/14/12  6:31 PM      Component Value Range Status Comment   Specimen Description STOOL   Final    Special Requests Normal   Final    Culture     Final    Value: ABUNDANT YEAST     NO SALMONELLA, SHIGELLA, CAMPYLOBACTER, YERSINIA, OR E.COLI 0157:H7 ISOLATED     Note: REDUCED NORMAL FLORA PRESENT   Report Status 10/18/2012 FINAL   Final   URINE CULTURE     Status: Normal   Collection Time   10/15/12  9:05 AM      Component Value Range Status Comment   Specimen Description URINE, CATHETERIZED   Final    Special Requests NONE   Final    Culture  Setup Time 10/15/2012 17:34   Final    Colony Count NO GROWTH   Final    Culture NO GROWTH   Final    Report Status 10/16/2012 FINAL   Final   CULTURE, BLOOD (ROUTINE X 2)     Status: Normal   Collection Time   10/15/12  9:55 AM      Component Value Range Status Comment   Specimen Description BLOOD LEFT HAND   Final    Special Requests BOTTLES DRAWN AEROBIC AND ANAEROBIC 10CC   Final    Culture  Setup Time 10/15/2012 16:06   Final    Culture NO GROWTH 5  DAYS   Final    Report Status 10/21/2012 FINAL   Final   CULTURE, BLOOD (ROUTINE X 2)     Status: Normal   Collection Time   10/15/12 10:05 AM      Component Value Range Status Comment   Specimen Description BLOOD RIGHT HAND   Final    Special Requests BOTTLES DRAWN AEROBIC AND ANAEROBIC [facility]   Final    Culture  Setup Time 10/15/2012 16:06   Final    Culture NO GROWTH 5 DAYS   Final    Report Status 10/21/2012 FINAL   Final   CULTURE, RESPIRATORY     Status: Normal   Collection Time   10/15/12 11:25 AM      Component Value Range Status Comment   Specimen Description  TRACHEAL ASPIRATE   Final    Special Requests NONE   Final    Gram Stain     Final    Value: RARE WBC PRESENT,BOTH PMN AND MONONUCLEAR     RARE SQUAMOUS EPITHELIAL CELLS PRESENT     NO ORGANISMS SEEN   Culture FEW CANDIDA ALBICANS   Final    Report Status 10/17/2012 FINAL   Final   CLOSTRIDIUM DIFFICILE BY PCR     Status: Normal   Collection Time   10/21/12  8:50 AM      Component Value Range Status Comment   C difficile by pcr NEGATIVE  NEGATIVE Final      Procedures and Diagnostic Studies: Dg Chest 1 View  10/14/2012  *RADIOLOGY REPORT*  Clinical Data: ETT, central line  CHEST - 1 VIEW  Comparison: 10/14/2012 at 0118 hours  Findings: Endotracheal tube terminates 5 mm above the carina and preferentially to dates the right mainstem bronchus.  Withdrawal approximately 2-3 cm is suggested.  Left IJ venous catheter terminates at the cavoatrial junction. Enteric tube courses into the stomach.  Stable diffuse coarse interstitial/airspace opacities. No pleural effusion or pneumothorax.  The heart is normal in size.  IMPRESSION: Endotracheal tube terminates 5 mm above the carina.  Withdrawal approximately 2-3 cm is suggested.  Left IJ venous catheter terminates at the cavoatrial junction. No pneumothorax.  Stable diffuse coarse interstitial/airspace opacities, possibly reflecting multifocal pneumonia or interstitial edema.   Original Report Authenticated By: Charline Bills, M.D.    Dg Chest 2 View  10/12/2012  *RADIOLOGY REPORT*  Clinical Data: Shortness of breath, weakness, diabetes, pneumonia  CHEST - 2 VIEW  Comparison: 10/11/2012  Findings: Extensive new bilateral airspace process versus edema. Stable heart size.  No effusion or pneumothorax.  Trachea is midline.  Airspace disease is more pronounced in the upper lobes.  IMPRESSION: Worsening diffuse bilateral airspace process compared to yesterday could represent acute edema versus rapid progression of pneumonia.   Original Report Authenticated  By: Judie Petit. Miles Costain, M.D.    Dg Chest Port 1 View  10/19/2012  *RADIOLOGY REPORT*  Clinical Data: Follow-up respiratory failure.  Endotracheal tube placement.  PORTABLE CHEST - 1 VIEW  Comparison: 10/18/2012  Findings: Endotracheal tube placed with tip about 4 cm above the carina.  Left central venous catheter with tip over the mid SVC region.  Shallow inspiration. Normal heart size and pulmonary vascularity.  Mild perihilar interstitial changes which could represent fibrosis or edema.  Peribronchial thickening. Basilar atelectasis is improving since the previous study.  IMPRESSION: Appliances are unchanged in position.  Diffuse interstitial changes in the lungs may represent fibrosis or edema.  Improving basilar atelectasis since previously.   Original Report Authenticated  By: Burman Nieves, M.D.    Dg Chest Port 1 View  10/18/2012  *RADIOLOGY REPORT*  Clinical Data: Evaluate endotracheal tube, central line, and chest disease.  PORTABLE CHEST - 1 VIEW  Comparison: 10/17/2012  Findings: Endotracheal tube tip measures 4.3 cm above the carina. Left central venous catheter tip over the mid SVC region.  Normal heart size and pulmonary vascularity. Diffuse interstitial changes and bilateral basilar infiltration appears stable.  No pneumothorax.  No significant change since yesterday.  IMPRESSION: Stable appearance of the chest since yesterday.   Original Report Authenticated By: Burman Nieves, M.D.    Dg Chest Port 1 View  10/17/2012  *RADIOLOGY REPORT*  Clinical Data: Airspace disease.  Ventilator patient.  PORTABLE CHEST - 1 VIEW  Comparison: 10/16/2012  Findings: Endotracheal tube tip measures 441 cm above the carina. Enteric tube tip is not visualized but is below left hemidiaphragm consistent with location in the distal stomach.  Left central venous catheter with tip overlying the mid SVC region.  Shallow inspiration.  Normal heart size.  A suggestion of pulmonary vascular congestion.  Diffuse interstitial  pattern in the lungs could represent edema, ARDS, or preexisting fibrosis.  No pneumothorax.  Overall stable appearance since previous study.  IMPRESSION: No significant change since yesterday.  Appliances remain in satisfactory apparent location.  Diffuse interstitial disease with possible pulmonary vascular congestion.  Normal heart size.   Original Report Authenticated By: Burman Nieves, M.D.    Dg Chest Port 1 View  10/16/2012  *RADIOLOGY REPORT*  Clinical Data: Endotracheal tube.  PORTABLE CHEST - 1 VIEW  Comparison: 10/15/2012.  Findings: The endotracheal tube tip is 27 mm from the carina.  Left IJ central line and enteric tube remain present.  Diffuse bilateral airspace disease is present, without interval change compared yesterday.  Cardiopericardial silhouette and mediastinal contours are also unchanged.  IMPRESSION: No interval change.  Stable support apparatus.   Original Report Authenticated By: Andreas Newport, M.D.    Dg Chest Port 1 View  10/15/2012  *RADIOLOGY REPORT*  Clinical Data: Endotracheal tube  PORTABLE CHEST - 1 VIEW  Comparison: Chest radiograph 10/14/2012  Findings: Endotracheal tube, NG tube, and left central venous line are unchanged.  Stable cardiac silhouette.  There is diffuse fine air space disease which is stable compared to prior with no improvement.  No pneumothorax  IMPRESSION:  1.  Stable support apparatus. 2.  Extensive diffuse bilateral air space disease is not improved.   Original Report Authenticated By: Genevive Bi, M.D.    Dg Chest Port 1 View  10/14/2012  *RADIOLOGY REPORT*  Clinical Data: Intubation  PORTABLE CHEST - 1 VIEW  Comparison: 10/14/2012  Findings: Endotracheal tube 3.7 cm above the carina.  Left IJ central line in the SVC RA junction.  NG tube in the stomach. Interval improvement in the diffuse mixed interstitial and airspace process.  The cardiac borders and hemidiaphragms are better visualize.  No enlarging effusion or pneumothorax.  Monitor  leads overlie the chest.  IMPRESSION: Endotracheal tube 3.7 cm above the carina.  Improving diffuse airspace process versus edema.   Original Report Authenticated By: Judie Petit. Miles Costain, M.D.    Dg Chest Port 1 View  10/14/2012  *RADIOLOGY REPORT*  Clinical Data: Shortness of breath, confusion.  PORTABLE CHEST - 1 VIEW  Comparison:   the previous day's study  Findings: Moderate diffuse alveolar opacities throughout both lungs, largely stable.  Heart size upper limits normal.  No definite effusion.  Regional bones unremarkable.  IMPRESSION:  Little  change in bilateral infiltrates or edema.   Original Report Authenticated By: D. Andria Rhein, MD    Dg Chest Port 1 View  10/13/2012  *RADIOLOGY REPORT*  Clinical Data: Follow up infiltrates  PORTABLE CHEST - 1 VIEW  Comparison: 10/12/2012  Findings: Severe diffuse bilateral airspace disease is present. This shows mild progression bilaterally.  No effusion.  Heart size is normal.  IMPRESSION: Mild progression of diffuse bilateral airspace disease .   Original Report Authenticated By: Janeece Riggers, M.D.    Dg Chest Port 1 View  10/11/2012  *RADIOLOGY REPORT*  Clinical Data: 52 year old female with shortness of breath cough and chest pain.  PORTABLE CHEST - 1 VIEW  Comparison: 07/29/2011 and earlier.  Findings: Portable semi upright AP view 1901 hours.  No asymmetric right upper lobe opacity is faint and nodular.  Appears to abut the right minor fissure.  Stable lung volumes.  Cardiac size and mediastinal contours are within normal limits.  Visualized tracheal air column is within normal limits.  No pneumothorax or effusion.  IMPRESSION: No asymmetric right upper lobe opacity, nonspecific but favor developing right upper lobe pneumonia.   Original Report Authenticated By: Erskine Speed, M.D.    Dg Abd Portable 1v  10/18/2012  *RADIOLOGY REPORT*  Clinical Data: 52 year old female - OG tube placement.  PORTABLE ABDOMEN - 1 VIEW  Comparison: 10/14/2012 radiograph  Findings: An  OG tube is identified with tip overlying the mid stomach. Mild gaseous distention of the colon is noted. No distended small bowel loops are present.  IMPRESSION: OG tube with tip overlying the mid stomach.   Original Report Authenticated By: Harmon Pier, M.D.    Dg Abd Portable 2v  10/14/2012  *RADIOLOGY REPORT*  Clinical Data: Abdominal distention  PORTABLE ABDOMEN - 2 VIEW  Comparison: 07/30/2011  Findings: Scattered air and stool throughout the bowel.  NG tube in the stomach.  Monitor leads overlie the chest.  No definite free air on the decubitus view.  IMPRESSION: Negative for obstruction or free air.   Original Report Authenticated By: Judie Petit. Shick, M.D.     Scheduled Meds:    . antiseptic oral rinse  15 mL Mouth Rinse QID  . brinzolamide  1 drop Both Eyes Daily  . cycloSPORINE  1 drop Both Eyes BID  . folic acid  1 mg Intravenous Daily  . potassium chloride  20 mEq Oral BID  . thiamine  100 mg Intravenous Daily   Continuous Infusions:   Time spent: 35 minutes.   LOS: 10 days   Karely Hurtado  [facility] Pager [phone].  If 8PM-8AM, please contact night-coverage at [email or web address], password [facility] 10/21/2012, 12:04 PM

## 2012-10-21 NOTE — Progress Notes (Signed)
NUTRITION FOLLOW UP  Intervention:   - Recommend Florastor to help with diarrhea - Ensure Complete BID - Will continue to monitor   Nutrition Dx:   Inadequate oral intake - ongoing  Goal:   1. Enteral nutrition; initiation with tolerance. Pt meeting >/=90% estimated needs - not met, pt not on TF 2. Wt/wt change; monitor trends. - met, pt's weight up 2 pounds since admission, however this may be fluid weight 3. Gastrointestinal; monitor stool output - rectal tube output total yesterday.   New goal: Pt to consume >50% of meals/supplements  Monitor:   Weights, labs, intake, stool output  Assessment:   Pt extubated 1/19, TF discontinued with removal of OGT. Pt slow to answer questions, unable to tell me how she's been eating. No meal intake charted, nursing unsure of intake. Only 1 episode of diarrhea charted so far today, improved from 2 episodes charted yesterday. Pt with rectal tube.   Height: Ht Readings from Last 1 Encounters:  10/20/12 5\' 3"  (1.6 m)    Weight Status:   Wt Readings from Last 1 Encounters:  10/20/12 102 lb 15.3 oz (46.7 kg)    Re-estimated needs:  Kcal: 1250-1500 Protein: 60-75g Fluid: 1.2-1.5L/day  Skin: Stage 2 medial pressure ulcer, moderate pitting generalized and facial edema per RN charting  Diet Order: Carb Control   Intake/Output Summary (Last 24 hours) at 10/21/12 1741 Last data filed at 10/21/12 0500  Gross per 24 hour  Intake      0 ml  Output   1600 ml  Net  -1600 ml    Last BM: 1/21, diarrhea   Labs:   Lab 10/20/12 0400 10/19/12 0630 10/18/12 0500 10/17/12 0335 10/16/12 0500  NA 145 141 139 -- --  K 3.1* 3.3* 4.1 -- --  CL 112 110 111 -- --  CO2 23 21 19  -- --  BUN 7 11 13  -- --  CREATININE 0.67 0.89 1.05 -- --  CALCIUM 7.2* 7.6* 7.5* -- --  MG -- -- 1.3* 1.6 1.8  PHOS -- -- 3.43.4 2.0*1.9* 1.8*  GLUCOSE 107* 94 71 -- --    CBG (last 3)   Basename 10/21/12 1622 10/21/12 0815 10/20/12 2342  GLUCAP 85 101*  98    Scheduled Meds:   . antiseptic oral rinse  15 mL Mouth Rinse QID  . brinzolamide  1 drop Both Eyes Daily  . cycloSPORINE  1 drop Both Eyes BID  . folic acid  1 mg Oral Daily  . potassium chloride  20 mEq Oral BID  . thiamine  100 mg Oral Daily    Continuous Infusions:   Levon Hedger MS, RD, LDN [phone] Pager [phone] After Hours Pager

## 2012-10-21 NOTE — Evaluation (Signed)
Physical Therapy Evaluation Patient Details Name: [name] MRN: [id number] DOB: [birth year] Today's Date: 10/21/2012 Time: 0865-7846 PT Time Calculation (min): 26 min  PT Assessment / Plan / Recommendation Clinical Impression  52 yo female with multiple medical conditions admitted on 10/11/12 with pna.  Pt on vent support until 10/19/12. Pt is deconditioned with multiple tubes and is having some cognitive impairment as well.  She will need continued PT at d/c likely at [facility]      PT Assessment  Patient needs continued PT services    Follow Up Recommendations  SNF    Does the patient have the potential to tolerate intense rehabilitation      Barriers to Discharge        Equipment Recommendations  Rolling walker with 5" wheels    Recommendations for Other Services OT consult   Frequency Min 3X/week    Precautions / Restrictions Precautions Precautions: Fall   Pertinent Vitals/Pain Pt c/o back pain      Mobility  Bed Mobility Bed Mobility: Supine to Sit Supine to Sit: 3: Mod assist Details for Bed Mobility Assistance: pt unable to follow directions for rolling Transfers Transfers: Sit to Stand;Stand to Sit Sit to Stand: 3: Mod assist Stand to Sit: 3: Mod assist Details for Transfer Assistance: pt needs assist to lift hips off bed Ambulation/Gait Ambulation/Gait Assistance: 3: Mod assist Ambulation Distance (Feet): 2 Feet Assistive device: Rolling walker Ambulation/Gait Assistance Details: pt is able to take a few steps around to chair Gait Pattern: Step-to pattern Gait velocity: decreased General Gait Details: pt pushin up on arms for stability Stairs: No Wheelchair Mobility Wheelchair Mobility: No    Shoulder Instructions     Exercises     PT Diagnosis: Difficulty walking;Generalized weakness;Altered mental status  PT Problem List: Decreased strength;Decreased activity tolerance;Decreased balance;Decreased cognition;Decreased safety awareness PT Treatment  Interventions: DME instruction;Gait training;Functional mobility training;Therapeutic activities;Therapeutic exercise;Balance training;Cognitive remediation;Patient/family education   PT Goals Acute Rehab PT Goals PT Goal Formulation: Patient unable to participate in goal setting Time For Goal Achievement: 11/04/12 Potential to Achieve Goals: Good Pt will Roll Supine to Right Side: with supervision PT Goal: Rolling Supine to Right Side - Progress: Goal set today Pt will Roll Supine to Left Side: with supervision PT Goal: Rolling Supine to Left Side - Progress: Goal set today Pt will go Supine/Side to Sit: with supervision PT Goal: Supine/Side to Sit - Progress: Goal set today Pt will go Sit to Supine/Side: with supervision PT Goal: Sit to Supine/Side - Progress: Goal set today Pt will go Sit to Stand: with supervision PT Goal: Sit to Stand - Progress: Goal set today Pt will go Stand to Sit: with supervision PT Goal: Stand to Sit - Progress: Goal set today Pt will Ambulate: >150 feet;with supervision;with least restrictive assistive device PT Goal: Ambulate - Progress: Goal set today  Visit Information  Last PT Received On: 10/21/12    Subjective Data  Subjective: "[name]" Patient Stated Goal: none stated   Prior Functioning  Home Living Additional Comments: pt unable to answer questions re: prior function Communication Communication: No difficulties    Cognition  Overall Cognitive Status: Impaired Area of Impairment: Attention Arousal/Alertness: Awake/alert Orientation Level: Disoriented to;Person;Time;Situation Current Attention Level: Focused Attention - Other Comments: pt is unable to sustain attention.  Pt can only follow one level commands    Extremity/Trunk Assessment Right Lower Extremity Assessment RLE ROM/Strength/Tone: Deficits RLE ROM/Strength/Tone Deficits: pt very thin with diffuse muscle atrophy Left Lower Extremity Assessment  LLE ROM/Strength/Tone:  Deficits LLE ROM/Strength/Tone Deficits: pt very thin with diffuse muscle atrophy Trunk Assessment Trunk Assessment: Other exceptions Trunk Exceptions: pt with foley catheter and rectal tube   Balance Balance Balance Assessed: Yes Static Sitting Balance Static Sitting - Balance Support: No upper extremity supported;Feet supported Static Sitting - Level of Assistance: 5: Stand by assistance Static Standing Balance Static Standing - Balance Support: During functional activity;Bilateral upper extremity supported Static Standing - Level of Assistance: 5: Stand by assistance  End of Session    GP     Teresa K. [employer], [place] [phone] 10/21/2012, 12:30 PM

## 2012-10-21 NOTE — Clinical Social Work Note (Signed)
CSW had been following in ICU as medical staff had some issues locating family while Pt on the vent. We were able to locate Pt's nephew and her 54yo son. ICU CSW signing off. Please re-consult CSW if Pt has other needs.   Doreen Salvage, LCSW ICU/Stepdown Clinical Social Worker [facility] Cell [phone] Hours 8am-1200pm M-F

## 2012-10-22 LAB — MAGNESIUM: Magnesium: 1 mg/dL — ABNORMAL LOW (ref 1.5–2.5)

## 2012-10-22 LAB — CBC
Hemoglobin: 8.2 g/dL — ABNORMAL LOW (ref 12.0–15.0)
MCH: 29.5 pg (ref 26.0–34.0)
MCHC: 32.3 g/dL (ref 30.0–36.0)
MCV: 91.4 fL (ref 78.0–100.0)
RBC: 2.78 MIL/uL — ABNORMAL LOW (ref 3.87–5.11)

## 2012-10-22 LAB — BASIC METABOLIC PANEL
BUN: 6 mg/dL (ref 6–23)
CO2: 23 mEq/L (ref 19–32)
GFR calc non Af Amer: >90 mL/min (ref >90)
Glucose, Bld: 94 mg/dL (ref 70–99)

## 2012-10-22 LAB — GLUCOSE, CAPILLARY: Glucose-Capillary: 101 mg/dL — ABNORMAL HIGH (ref 70–99)

## 2012-10-22 MED ORDER — SACCHAROMYCES BOULARDII 250 MG PO CAPS
250.0000 mg | ORAL_CAPSULE | Freq: Two times a day (BID) | ORAL | Status: DC
  Administered 2012-10-22 – 2012-10-24 (×5): 250 mg via ORAL
  Filled 2012-10-22 (×6): qty 1

## 2012-10-22 MED ORDER — MAGNESIUM SULFATE 40 MG/ML IJ SOLN
4.0000 g | Freq: Once | INTRAMUSCULAR | Status: AC
  Administered 2012-10-22: 4 g via INTRAVENOUS
  Filled 2012-10-22: qty 100

## 2012-10-22 NOTE — Progress Notes (Signed)
[facility] PROGRESS NOTE  LOVELL ROE ZOX:[id number] DOB: 1961-01-08 DOA: 10/11/2012 PCP: Jeri Cos, MD  Brief narrative: [name] is a 52 year old woman with past medical history of diabetes, hypertension, hepatitis C, chronic pain syndrome and pancreatitis who was admitted on 10/11/2012 with severe sepsis secondary to H1N1 pneumonitis versus ARDS, ventilatory dependent respiratory failure (intubated 10/14/2012---> 10/19/2012), under the care of the critical care team since 10/12/2012, transferred back to the hospitalist service on 10/21/2012.  Assessment/Plan: Principal Problem:  *Severe sepsis with septic shock, status post ventilatory dependent respiratory failure  Respiratory cultures grew normal flora. Influenza studies for H1N1 were positive (status post 5 days of therapy with Tamiflu). She has also completed a course of aztreonam, and azithromycin, and vancomycin for healthcare associated pneumonia. Blood cultures have been negative to date.  Intubated 10/14/2012-10/19/2012 for acute respiratory failure secondary to influenza associated ARDS. Active Problems: Encephalopathy  The patient's mental status appears to be impaired. She is very slow to respond. She has a history of chronic opiate dependency. CT scan of the head done 10/21/2012 which showed global atrophy but no evidence of anoxic brain injury or stroke. Mental status appears to be improved today.  Diarrhea  Has rectal tube in place. C. difficile was negative 10/14/2012 and again 10/21/2012.   Start Florastor.  DIABETES MELLITUS, TYPE II  CBGs 81-101. Not currently on any therapy. Hemoglobin A1c 5.5 on 10/11/2012, so unclear if the patient truly has a diagnosis of diabetes. Hemoglobin A1c range 5.4-5.6 consistently since 04/2007.  Home dose of Amaryl remains on hold.  ANXIETY / DEPRESSION  On Elavil, and Xanax at home. These medications are currently on hold.  ASTHMA  Monitor for bronchospasm.  Community  acquired versus healthcare associated pneumonia  Completed a course of therapy with aztreonam, azithromycin, and vancomycin.  Bilateral lower abdominal discomfort / history of Crohn's disease  Monitor. Treat symptomatically.  H1N1 influenza  Status post Tamiflu x5 days.   Acute respiratory failure / ARDS (adult respiratory distress syndrome)  Intubated 10/14/2012 until 10/19/2012.  Metabolic acidosis  Secondary to sepsis. Resolved.  Hypernatremia  Likely from dehydration. Resolved.  Hypokalemia  Monitor and replace as needed.  Hypomagnesemia  Monitor and replace as needed.  Hypophosphatemia  Resolved.  Leukocytosis  Secondary to infection. White blood cell count trending down over time.  Normocytic anemia  Likely anemia of acute illness. No current indication for transfusion.  Thrombocytopenia  Likely secondary to sepsis. No evidence of acute bleeding problems.  Elevated INR  Likely secondary to sepsis. No evidence of acute bleeding problems  Code Status: Full. Family Communication: No family at bedside. Disposition Plan: Suspect will need SNF.   Medical Consultants:  PCCM  Other Consultants:  Physical therapy: Skilled nursing home recommended.  Occupational therapy: Skilled nursing home recommended.  Dietitian: Ensure supplements..  Anti-infectives:  Aztreonam 1/11 >> 1/19   Azithro 1/11 >> 1/19   Vanco 1/11 >>1/14   Oseltamivir 1/16 >> 1/20  HPI/Subjective: [name] is a bit quicker to respond to my questions today, although she still appears to have some impairment in her concentration. Has a rectal tube inserted for diarrhea. Appetite is fair. She does complain of back pain and continues to request for Dilaudid-HP for pain control.  Objective: Filed Vitals:   10/21/12 0500 10/21/12 2105 10/22/12 0608 10/22/12 1320  BP: 139/94 120/85 121/88 107/77  Pulse: 117 107 109 120  Temp: 98.1 F (36.7 C) 99.2 F (37.3 C) 99.2 F (37.3 C) 98.7 F  (37.1 C)  TempSrc: Oral Oral Oral Oral  Resp: 18 20 20 20   Height:      Weight:      SpO2: 100% 99% 98% 98%    Intake/Output Summary (Last 24 hours) at 10/22/12 1335 Last data filed at 10/22/12 1321  Gross per 24 hour  Intake    480 ml  Output    725 ml  Net   -245 ml    Exam: Gen:  Slow to respond. Cardiovascular:  Tachycardic Respiratory:  Lungs diminished Gastrointestinal:  Abdomen soft, NT/ND, + BS Extremities:  No C/E/C  Data Reviewed: Basic Metabolic Panel:  Lab 10/22/12 1610 10/20/12 0400 10/19/12 0630 10/18/12 0500 10/17/12 0335 10/16/12 0500  NA 139 145 141 139 145145 --  K 3.8 3.1* -- -- -- --  CL 104 112 110 111 117*117* --  CO2 23 23 21 19  1919 --  GLUCOSE 94 107* 94 71 109*113* --  BUN 6 7 11 13  1414 --  CREATININE 0.71 0.67 0.89 1.05 1.28*1.30* --  CALCIUM 7.5* 7.2* 7.6* 7.5* 7.4*7.5* --  MG 1.0* -- -- 1.3* 1.6 1.8  PHOS -- -- -- 3.43.4 2.0*1.9* 1.8*   GFR Estimated Creatinine Clearance: 60 ml/min (by C-G formula based on Cr of 0.71). Liver Function Tests:  Lab 10/18/12 0500 10/17/12 0335 10/16/12 0500  AST -- -- --  ALT -- -- --  ALKPHOS -- -- --  BILITOT -- -- --  PROT -- -- --  ALBUMIN 1.2* 1.2* 1.4*   Coagulation profile  Lab 10/18/12 0500 10/17/12 0335 10/16/12 0500  INR 1.33 1.48 1.42  PROTIME -- -- --    CBC:  Lab 10/22/12 0437 10/20/12 0400 10/19/12 0630 10/18/12 0500 10/17/12 0335  WBC 15.6* 11.1* 8.7 7.8 11.8*  NEUTROABS -- -- -- -- --  HGB 8.2* 8.1* 7.9* 8.0* 8.8*  HCT 25.4* 23.9* 23.9* 23.0* 24.4*  MCV 91.4 89.2 87.9 82.7 81.9  PLT 339 168 107* 71* 74*   CBG:  Lab 10/22/12 0745 10/22/12 0146 10/21/12 1622 10/21/12 0815 10/20/12 2342  GLUCAP 81 101* 85 101* 98   Microbiology Recent Results (from the past 240 hour(s))  CLOSTRIDIUM DIFFICILE BY PCR     Status: Normal   Collection Time   10/14/12  2:41 AM      Component Value Range Status Comment   C difficile by pcr NEGATIVE  NEGATIVE Final   STOOL CULTURE      Status: Normal   Collection Time   10/14/12  6:31 PM      Component Value Range Status Comment   Specimen Description STOOL   Final    Special Requests Normal   Final    Culture     Final    Value: ABUNDANT YEAST     NO SALMONELLA, SHIGELLA, CAMPYLOBACTER, YERSINIA, OR E.COLI 0157:H7 ISOLATED     Note: REDUCED NORMAL FLORA PRESENT   Report Status 10/18/2012 FINAL   Final   URINE CULTURE     Status: Normal   Collection Time   10/15/12  9:05 AM      Component Value Range Status Comment   Specimen Description URINE, CATHETERIZED   Final    Special Requests NONE   Final    Culture  Setup Time 10/15/2012 17:34   Final    Colony Count NO GROWTH   Final    Culture NO GROWTH   Final    Report Status 10/16/2012 FINAL   Final   CULTURE, BLOOD (ROUTINE X 2)  Status: Normal   Collection Time   10/15/12  9:55 AM      Component Value Range Status Comment   Specimen Description BLOOD LEFT HAND   Final    Special Requests BOTTLES DRAWN AEROBIC AND ANAEROBIC 10CC   Final    Culture  Setup Time 10/15/2012 16:06   Final    Culture NO GROWTH 5 DAYS   Final    Report Status 10/21/2012 FINAL   Final   CULTURE, BLOOD (ROUTINE X 2)     Status: Normal   Collection Time   10/15/12 10:05 AM      Component Value Range Status Comment   Specimen Description BLOOD RIGHT HAND   Final    Special Requests BOTTLES DRAWN AEROBIC AND ANAEROBIC [facility]   Final    Culture  Setup Time 10/15/2012 16:06   Final    Culture NO GROWTH 5 DAYS   Final    Report Status 10/21/2012 FINAL   Final   CULTURE, RESPIRATORY     Status: Normal   Collection Time   10/15/12 11:25 AM      Component Value Range Status Comment   Specimen Description TRACHEAL ASPIRATE   Final    Special Requests NONE   Final    Gram Stain     Final    Value: RARE WBC PRESENT,BOTH PMN AND MONONUCLEAR     RARE SQUAMOUS EPITHELIAL CELLS PRESENT     NO ORGANISMS SEEN   Culture FEW CANDIDA ALBICANS   Final    Report Status 10/17/2012 FINAL   Final     CLOSTRIDIUM DIFFICILE BY PCR     Status: Normal   Collection Time   10/21/12  8:50 AM      Component Value Range Status Comment   C difficile by pcr NEGATIVE  NEGATIVE Final      Procedures and Diagnostic Studies: Dg Chest 1 View  10/14/2012  *RADIOLOGY REPORT*  Clinical Data: ETT, central line  CHEST - 1 VIEW  Comparison: 10/14/2012 at 0118 hours  Findings: Endotracheal tube terminates 5 mm above the carina and preferentially to dates the right mainstem bronchus.  Withdrawal approximately 2-3 cm is suggested.  Left IJ venous catheter terminates at the cavoatrial junction. Enteric tube courses into the stomach.  Stable diffuse coarse interstitial/airspace opacities. No pleural effusion or pneumothorax.  The heart is normal in size.  IMPRESSION: Endotracheal tube terminates 5 mm above the carina.  Withdrawal approximately 2-3 cm is suggested.  Left IJ venous catheter terminates at the cavoatrial junction. No pneumothorax.  Stable diffuse coarse interstitial/airspace opacities, possibly reflecting multifocal pneumonia or interstitial edema.   Original Report Authenticated By: Charline Bills, M.D.    Dg Chest 2 View  10/12/2012  *RADIOLOGY REPORT*  Clinical Data: Shortness of breath, weakness, diabetes, pneumonia  CHEST - 2 VIEW  Comparison: 10/11/2012  Findings: Extensive new bilateral airspace process versus edema. Stable heart size.  No effusion or pneumothorax.  Trachea is midline.  Airspace disease is more pronounced in the upper lobes.  IMPRESSION: Worsening diffuse bilateral airspace process compared to yesterday could represent acute edema versus rapid progression of pneumonia.   Original Report Authenticated By: Judie Petit. Miles Costain, M.D.    Dg Chest Port 1 View  10/19/2012  *RADIOLOGY REPORT*  Clinical Data: Follow-up respiratory failure.  Endotracheal tube placement.  PORTABLE CHEST - 1 VIEW  Comparison: 10/18/2012  Findings: Endotracheal tube placed with tip about 4 cm above the carina.  Left central  venous catheter with tip  over the mid SVC region.  Shallow inspiration. Normal heart size and pulmonary vascularity.  Mild perihilar interstitial changes which could represent fibrosis or edema.  Peribronchial thickening. Basilar atelectasis is improving since the previous study.  IMPRESSION: Appliances are unchanged in position.  Diffuse interstitial changes in the lungs may represent fibrosis or edema.  Improving basilar atelectasis since previously.   Original Report Authenticated By: Burman Nieves, M.D.    Dg Chest Port 1 View  10/18/2012  *RADIOLOGY REPORT*  Clinical Data: Evaluate endotracheal tube, central line, and chest disease.  PORTABLE CHEST - 1 VIEW  Comparison: 10/17/2012  Findings: Endotracheal tube tip measures 4.3 cm above the carina. Left central venous catheter tip over the mid SVC region.  Normal heart size and pulmonary vascularity. Diffuse interstitial changes and bilateral basilar infiltration appears stable.  No pneumothorax.  No significant change since yesterday.  IMPRESSION: Stable appearance of the chest since yesterday.   Original Report Authenticated By: Burman Nieves, M.D.    Dg Chest Port 1 View  10/17/2012  *RADIOLOGY REPORT*  Clinical Data: Airspace disease.  Ventilator patient.  PORTABLE CHEST - 1 VIEW  Comparison: 10/16/2012  Findings: Endotracheal tube tip measures 441 cm above the carina. Enteric tube tip is not visualized but is below left hemidiaphragm consistent with location in the distal stomach.  Left central venous catheter with tip overlying the mid SVC region.  Shallow inspiration.  Normal heart size.  A suggestion of pulmonary vascular congestion.  Diffuse interstitial pattern in the lungs could represent edema, ARDS, or preexisting fibrosis.  No pneumothorax.  Overall stable appearance since previous study.  IMPRESSION: No significant change since yesterday.  Appliances remain in satisfactory apparent location.  Diffuse interstitial disease with possible  pulmonary vascular congestion.  Normal heart size.   Original Report Authenticated By: Burman Nieves, M.D.    Dg Chest Port 1 View  10/16/2012  *RADIOLOGY REPORT*  Clinical Data: Endotracheal tube.  PORTABLE CHEST - 1 VIEW  Comparison: 10/15/2012.  Findings: The endotracheal tube tip is 27 mm from the carina.  Left IJ central line and enteric tube remain present.  Diffuse bilateral airspace disease is present, without interval change compared yesterday.  Cardiopericardial silhouette and mediastinal contours are also unchanged.  IMPRESSION: No interval change.  Stable support apparatus.   Original Report Authenticated By: Andreas Newport, M.D.    Dg Chest Port 1 View  10/15/2012  *RADIOLOGY REPORT*  Clinical Data: Endotracheal tube  PORTABLE CHEST - 1 VIEW  Comparison: Chest radiograph 10/14/2012  Findings: Endotracheal tube, NG tube, and left central venous line are unchanged.  Stable cardiac silhouette.  There is diffuse fine air space disease which is stable compared to prior with no improvement.  No pneumothorax  IMPRESSION:  1.  Stable support apparatus. 2.  Extensive diffuse bilateral air space disease is not improved.   Original Report Authenticated By: Genevive Bi, M.D.    Dg Chest Port 1 View  10/14/2012  *RADIOLOGY REPORT*  Clinical Data: Intubation  PORTABLE CHEST - 1 VIEW  Comparison: 10/14/2012  Findings: Endotracheal tube 3.7 cm above the carina.  Left IJ central line in the SVC RA junction.  NG tube in the stomach. Interval improvement in the diffuse mixed interstitial and airspace process.  The cardiac borders and hemidiaphragms are better visualize.  No enlarging effusion or pneumothorax.  Monitor leads overlie the chest.  IMPRESSION: Endotracheal tube 3.7 cm above the carina.  Improving diffuse airspace process versus edema.   Original Report Authenticated By: Judie Petit.  Miles Costain, M.D.    Dg Chest Port 1 View  10/14/2012  *RADIOLOGY REPORT*  Clinical Data: Shortness of breath, confusion.   PORTABLE CHEST - 1 VIEW  Comparison:   the previous day's study  Findings: Moderate diffuse alveolar opacities throughout both lungs, largely stable.  Heart size upper limits normal.  No definite effusion.  Regional bones unremarkable.  IMPRESSION:  Little change in bilateral infiltrates or edema.   Original Report Authenticated By: D. Andria Rhein, MD    Dg Chest Port 1 View  10/13/2012  *RADIOLOGY REPORT*  Clinical Data: Follow up infiltrates  PORTABLE CHEST - 1 VIEW  Comparison: 10/12/2012  Findings: Severe diffuse bilateral airspace disease is present. This shows mild progression bilaterally.  No effusion.  Heart size is normal.  IMPRESSION: Mild progression of diffuse bilateral airspace disease .   Original Report Authenticated By: Janeece Riggers, M.D.    Dg Chest Port 1 View  10/11/2012  *RADIOLOGY REPORT*  Clinical Data: 52 year old female with shortness of breath cough and chest pain.  PORTABLE CHEST - 1 VIEW  Comparison: 07/29/2011 and earlier.  Findings: Portable semi upright AP view 1901 hours.  No asymmetric right upper lobe opacity is faint and nodular.  Appears to abut the right minor fissure.  Stable lung volumes.  Cardiac size and mediastinal contours are within normal limits.  Visualized tracheal air column is within normal limits.  No pneumothorax or effusion.  IMPRESSION: No asymmetric right upper lobe opacity, nonspecific but favor developing right upper lobe pneumonia.   Original Report Authenticated By: Erskine Speed, M.D.    Dg Abd Portable 1v  10/18/2012  *RADIOLOGY REPORT*  Clinical Data: 52 year old female - OG tube placement.  PORTABLE ABDOMEN - 1 VIEW  Comparison: 10/14/2012 radiograph  Findings: An OG tube is identified with tip overlying the mid stomach. Mild gaseous distention of the colon is noted. No distended small bowel loops are present.  IMPRESSION: OG tube with tip overlying the mid stomach.   Original Report Authenticated By: Harmon Pier, M.D.    Dg Abd Portable  2v  10/14/2012  *RADIOLOGY REPORT*  Clinical Data: Abdominal distention  PORTABLE ABDOMEN - 2 VIEW  Comparison: 07/30/2011  Findings: Scattered air and stool throughout the bowel.  NG tube in the stomach.  Monitor leads overlie the chest.  No definite free air on the decubitus view.  IMPRESSION: Negative for obstruction or free air.   Original Report Authenticated By: Judie Petit. Miles Costain, M.D.    Ct Head Wo Contrast  10/21/2012  *RADIOLOGY REPORT*  Clinical Data: Weakness and altered mental status.  Rule out anoxic brain injury.  History of intubation for respiratory failure.  CT HEAD WITHOUT CONTRAST  Technique:  Contiguous axial images were obtained from the base of the skull through the vertex without contrast.  Comparison: None.  Findings: No intracranial hemorrhage.  Global atrophy advance for patient's age as are the presence of vascular calcifications.  No hydrocephalus.  Mild white matter type changes may reflect changes of small vessel disease without CT evidence of large acute infarct or significant anoxic injury.  No intracranial mass lesion detected on this unenhanced exam.  IMPRESSION: Global atrophy advance for patient's age as are the presence of vascular calcifications.  Mild white matter type changes may reflect changes of small vessel disease without CT evidence of large acute infarct or significant anoxic injury.   Original Report Authenticated By: Lacy Duverney, M.D.    Scheduled Meds:    . antiseptic oral rinse  15  mL Mouth Rinse QID  . brinzolamide  1 drop Both Eyes Daily  . cycloSPORINE  1 drop Both Eyes BID  . feeding supplement  237 mL Oral BID BM  . folic acid  1 mg Oral Daily  . magnesium sulfate 1 - 4 g bolus IVPB  4 g Intravenous Once  . potassium chloride  20 mEq Oral BID  . saccharomyces boulardii  250 mg Oral BID  . thiamine  100 mg Oral Daily   Continuous Infusions:   Time spent: 25 minutes.   LOS: 11 days   Jeovany Huitron  [facility] Pager [phone].  If  8PM-8AM, please contact night-coverage at [email or web address], password [facility] 10/22/2012, 1:35 PM

## 2012-10-22 NOTE — Progress Notes (Signed)
Clinical Social Work Department BRIEF PSYCHOSOCIAL ASSESSMENT 10/22/2012  Patient:  [name], [name]     Account Number:  [id number]     Admit date:  10/11/2012  Clinical Social Worker:  Jacelyn Grip  Date/Time:  10/22/2012 02:00 PM  Referred by:  Physician  Date Referred:  10/22/2012 Referred for  SNF Placement   Other Referral:   Interview type:  Family Other interview type:    PSYCHOSOCIAL DATA Living Status:  FAMILY Admitted from facility:   Level of care:   Primary support name:  [name]/nephew Primary support relationship to patient:  FAMILY Degree of support available:   moderate    CURRENT CONCERNS Current Concerns  Post-Acute Placement   Other Concerns:    SOCIAL WORK ASSESSMENT / PLAN CSW received referral for New SNF placement.    Per MD, patient's mental status appears to be impaired. She is very slow to respond. CSW contacted pt nephew, [name] Fellers via telephone re: disposition.    Pt nephew reports that he, pt, and pt son lived in an apartment together prior to admission. CSW discussed recommendation for SNF placement and pt nephew agreeable to SNF search in [place].    CSW completed FL2 and initiated SNF search to [facility]. CSW to follow up with pt nephew in regard to bed offers. CSW to facilitate pt discharge needs when pt medically ready for discharge.   Assessment/plan status:  Psychosocial Support/Ongoing Assessment of Needs Other assessment/ plan:   discharge planning   Information/referral to community resources:   [facility] list left at bedside for when pt nephew visits    PATIENT'S/FAMILY'S RESPONSE TO PLAN OF CARE: Pt oriented to person only. Pt nephew agreeable to SNF search.         Jacklynn Lewis, MSW, LCSWA  Clinical Social Work [phone]

## 2012-10-22 NOTE — Progress Notes (Addendum)
Clinical Social Work Department CLINICAL SOCIAL WORK PLACEMENT NOTE 10/22/2012  Patient:  ASHA, GRUMBINE  Account Number:  [id number] Admit date:  10/11/2012  Clinical Social Worker:  Jacelyn Grip  Date/time:  10/22/2012 03:00 PM  Clinical Social Work is seeking post-discharge placement for this patient at the following level of care:   SKILLED NURSING   (*CSW will update this form in Epic as items are completed)   10/22/2012  Patient/family provided with [facility] Department of Clinical Social Work's list of facilities offering this level of care within the geographic area requested by the patient (or if unable, by the patient's family).  10/22/2012  Patient/family informed of their freedom to choose among providers that offer the needed level of care, that participate in Medicare, Medicaid or managed care program needed by the patient, have an available bed and are willing to accept the patient.  10/22/2012  Patient/family informed of [facility]' ownership interest in [facility], as well as of the fact that they are under no obligation to receive care at this facility.  PASARR submitted to EDS on 10/22/2012 PASARR number received from EDS on   FL2 transmitted to all facilities in geographic area requested by pt/family on  10/22/2012 FL2 transmitted to all facilities within larger geographic area on   Patient informed that his/her managed care company has contracts with or will negotiate with  certain facilities, including the following:     Patient/family informed of bed offers received: 10/23/2012   Patient chooses bed at [facility] Physician recommends and patient chooses bed at    Patient to be transferred to  on [facility] on 10/24/2012  Patient to be transferred to facility by ambulance Sharin Mons)  The following physician request were entered in Epic:   Additional Comments:  Pt deemed by psychiatry to lack capacity. Pt  son and pt nephew chose [facility] for rehab.   Jacklynn Lewis, MSW, LCSWA  Clinical Social Work [phone]

## 2012-10-22 NOTE — Evaluation (Signed)
Occupational Therapy Evaluation Patient Details Name: [name] MRN: [id number] DOB: 1961-08-25 Today's Date: 10/22/2012 Time: 7829-5621 OT Time Calculation (min): 24 min  OT Assessment / Plan / Recommendation Clinical Impression  This 52 year old female was admitted with sepsis.  CT scan showed global atrophy.  She has PMH of dm, htn, hep c, chronic pain and pancreatitis.  Pt is on droplet precautions and has a cathether and rectal pouch. Pt is currently overall mod A for adls with mod to max  vcs needed.  She is appropriate for skilled OT with min A level goals in acute    OT Assessment  Patient needs continued OT Services    Follow Up Recommendations  SNF    Barriers to Discharge      Equipment Recommendations  3 in 1 bedside comode    Recommendations for Other Services    Frequency  Min 2X/week    Precautions / Restrictions Precautions Precautions: Fall Restrictions Weight Bearing Restrictions: No   Pertinent Vitals/Pain C/o back pain:  Not rated.  Repositioned and requested pain meds.  Dizzy when first sitting and first standing.  Seated BP after transfer 122/81    ADL  Grooming: Simulated;Minimal assistance Where Assessed - Grooming: Unsupported sitting Upper Body Bathing: Simulated;Minimal assistance Where Assessed - Upper Body Bathing: Unsupported sitting Lower Body Bathing: Simulated;Moderate assistance Where Assessed - Lower Body Bathing: Supported sit to stand Upper Body Dressing: Performed;Moderate assistance Where Assessed - Upper Body Dressing: Supine, head of bed up Lower Body Dressing: Simulated;Moderate assistance Where Assessed - Lower Body Dressing: Supported sit to stand Toilet Transfer: Mining engineer Method: Surveyor, minerals:  (bed to chair) Toileting - Clothing Manipulation and Hygiene: Simulated;Moderate assistance Where Assessed - Toileting Clothing Manipulation and Hygiene: Sit to stand from  3-in-1 or toilet Equipment Used: Rolling walker Transfers/Ambulation Related to ADLs: Pt performed spt to chair.  She reported dizziness when first sitting up and when first standing.  Lost balance posteriorly in standing.  BP after sitting in chair was 122/81 ADL Comments: Pt able to doff socks and blow nose. Needs mod to max vcs to continue tasks as she gets very distracted.    OT Diagnosis: Cognitive deficits;Generalized weakness  OT Problem List: Decreased strength;Impaired balance (sitting and/or standing);Decreased cognition;Decreased safety awareness;Pain OT Treatment Interventions: Self-care/ADL training;DME and/or AE instruction;Therapeutic activities;Cognitive remediation/compensation;Patient/family education;Balance training   OT Goals Acute Rehab OT Goals OT Goal Formulation: Patient unable to participate in goal setting Time For Goal Achievement: 11/05/12 Potential to Achieve Goals: Fair ADL Goals Pt Will Perform Grooming: Sitting, edge of bed;Unsupported;with set-up;with supervision (with no more than 1 vc per task:  all tasks quiet environmen) ADL Goal: Grooming - Progress: Goal set today Pt Will Perform Upper Body Bathing: with set-up;with supervision;Sitting, edge of bed (no more than 2 vcs) ADL Goal: Upper Body Bathing - Progress: Goal set today Pt Will Perform Lower Body Bathing: with min assist;Sit to stand from chair (min cues) ADL Goal: Lower Body Bathing - Progress: Goal set today Pt Will Perform Upper Body Dressing: with set-up;with supervision;Sitting, bed;Unsupported (no more than 1 vc) ADL Goal: Upper Body Dressing - Progress: Goal set today Pt Will Perform Lower Body Dressing: with min assist;Sit to stand from chair (min cues) ADL Goal: Lower Body Dressing - Progress: Goal set today Pt Will Transfer to Toilet: with supervision;Ambulation;3-in-1 (min cues) ADL Goal: Toilet Transfer - Progress: Goal set today Pt Will Perform Toileting - Hygiene: with  supervision;Sit to stand  from 3-in-1/toilet (min cues) ADL Goal: Toileting - Hygiene - Progress: Goal set today  Visit Information  Last OT Received On: 10/22/12 Assistance Needed: +1    Subjective Data  Subjective: I need to call my baby Patient Stated Goal: none stated   Prior Functioning     Home Living Additional Comments: states she has a 69 year old son.  Unable to verify with nursing Prior Function Comments: unknown Communication Communication: No difficulties         Vision/Perception     Cognition  Overall Cognitive Status: Impaired Area of Impairment: Attention Arousal/Alertness: Awake/alert Orientation Level: Disoriented to;Time;Situation Behavior During Session:  (variable affect:  labile--redirected when crying) Current Attention Level: Focused Attention - Other Comments: pt is unable to sustain attention.  Pt can only follow one step commands with mod to max cues   Extremity/Trunk Assessment Right Upper Extremity Assessment RUE ROM/Strength/Tone: [facility] for tasks assessed (lifted bil to 90; would not lift beyond this. Did not pursue as everything takes additional time.   Left Upper Extremity Assessment LUE ROM/Strength/Tone: WFL for tasks assessed     Mobility Bed Mobility Left Sidelying to Sit: 3: Mod assist Details for Bed Mobility Assistance: mod for rolling to R & L Transfers Sit to Stand: 4: Min assist;From elevated surface;From bed;With upper extremity assist     Shoulder Instructions     Exercise     Balance Static Sitting Balance Static Sitting - Balance Support: No upper extremity supported;Feet supported Static Sitting - Level of Assistance: 5: Stand by assistance Static Standing Balance Static Standing - Balance Support: Bilateral upper extremity supported Static Standing - Level of Assistance: 4: Min assist   End of Session OT - End of Session Activity Tolerance: Patient tolerated treatment well Patient left: in chair;with call  bell/phone within reach Nurse Communication:  (up in chair)  GO     Gentle Hoge 10/22/2012, 10:55 AM Marica Otter, OTR/L [phone] 10/22/2012

## 2012-10-23 DIAGNOSIS — F329 Major depressive disorder, single episode, unspecified: Secondary | ICD-10-CM

## 2012-10-23 LAB — GLUCOSE, CAPILLARY
Glucose-Capillary: 103 mg/dL — ABNORMAL HIGH (ref 70–99)
Glucose-Capillary: 88 mg/dL (ref 70–99)
Glucose-Capillary: 116 mg/dL — ABNORMAL HIGH (ref 70–99)
Glucose-Capillary: 117 mg/dL — ABNORMAL HIGH (ref 70–99)

## 2012-10-23 MED ORDER — HYDROMORPHONE HCL PF 1 MG/ML IJ SOLN: 1.0000 mg | Freq: Once | INTRAMUSCULAR | Status: DC

## 2012-10-23 MED ORDER — HYDROMORPHONE HCL 2 MG PO TABS
1.0000 mg | ORAL_TABLET | Freq: Once | ORAL | Status: AC
  Administered 2012-10-23: 1 mg via ORAL
  Filled 2012-10-23: qty 1

## 2012-10-23 NOTE — Progress Notes (Signed)
Physical Therapy Treatment Patient Details Name: [name] MRN: [id number] DOB: [birth year] Today's Date: 10/23/2012 Time: 8295-6213 PT Time Calculation (min): 30 min  PT Assessment / Plan / Recommendation Comments on Treatment Session  Pt demonstrated cognitive limitation to participation with PT.  She also demonstrated good sitting balance, transfers, and gait with +1 assist.  Feel she will benefit from continued PT at home for strengthening as she may cooperate better in her own environment.    Follow Up Recommendations  Home health PT     Does the patient have the potential to tolerate intense rehabilitation     Barriers to Discharge        Equipment Recommendations  None recommended by PT;Other (comment) (son reports pt has a RW at home)    Recommendations for Other Services    Frequency Min 3X/week   Plan Discharge plan needs to be updated;Frequency remains appropriate    Precautions / Restrictions Precautions Precautions: Fall Precaution Comments: due to decreased activity with hospitalization Restrictions Weight Bearing Restrictions: No   Pertinent Vitals/Pain No c/o pain    Mobility  Bed Mobility Details for Bed Mobility Assistance: pt sitting on EOB independently upon arrival. She would not lie down. Transfers Transfers: Stand to Sit;Sit to Stand Sit to Stand: 4: Min assist;Other (comment) (son held patient's hand) Stand to Sit: 5: Supervision Ambulation/Gait Ambulation/Gait Assistance: 4: Min assist Ambulation Distance (Feet): 30 Feet Assistive device: 1 person hand held assist;Rolling walker Ambulation/Gait Assistance Details: pt holding on to son's hand and pushing RW that has foley and rectal tube collection bags attached.  Gait Pattern: Within Functional Limits Gait velocity: decreased General Gait Details: gait slow and limited by deconditioning, but pt did not show evidence of balance loss.  She walked from bed to stand at sink and back to bed with  son Stairs: No Wheelchair Mobility Wheelchair Mobility: No    Exercises     PT Diagnosis:    PT Problem List:   PT Treatment Interventions:     PT Goals Acute Rehab PT Goals PT Goal Formulation: Patient unable to participate in goal setting Time For Goal Achievement: 11/04/12 Potential to Achieve Goals: Good Pt will Roll Supine to Right Side: with supervision Pt will Roll Supine to Left Side: with supervision Pt will go Supine/Side to Sit: with supervision Pt will go Sit to Supine/Side: with supervision Pt will go Sit to Stand: with supervision PT Goal: Sit to Stand - Progress: Progressing toward goal Pt will go Stand to Sit: with supervision PT Goal: Stand to Sit - Progress: Progressing toward goal Pt will Ambulate: >150 feet;with supervision;with least restrictive assistive device PT Goal: Ambulate - Progress: Progressing toward goal  Visit Information  Last PT Received On: 10/23/12 Assistance Needed: +1    Subjective Data  Subjective: "I'm going home---- TODAY!" Patient Stated Goal: pt wants to go home. Family in room and state that they will be there 24/7 between them   Cognition  Overall Cognitive Status: Impaired Area of Impairment: Following commands;Memory;Awareness of deficits;Problem solving;Safety/judgement Arousal/Alertness: Awake/alert Orientation Level: Disoriented to;Place;Time;Situation;Other (comment) (pt happy that her family is visiting) Behavior During Session: Other (comment) (pt would not follow commands or cooperate with PT ) Attention - Other Comments: prolonged time with patient and family to work on d/c planning and moblity.  Pt would not follow commands from PT but did respond positively to her son  Cognition - Other Comments: son reports pt had delayed responses previously, but not like this.  Balance  Balance Balance Assessed: Yes Static Sitting Balance Static Sitting - Balance Support: No upper extremity supported Static Sitting -  Level of Assistance: 7: Independent Static Sitting - Comment/# of Minutes: > 30 minutes with no balance loss Static Standing Balance Static Standing - Balance Support: Bilateral upper extremity supported Static Standing - Level of Assistance: 6: Modified independent (Device/Increase time)  End of Session PT - End of Session Activity Tolerance: Other (comment) (pt did not want to cooperate with PT today) Patient left: in bed;Other (comment);with family/visitor present (sitting on EOB) Nurse Communication: Other (comment) (pt behavior and d/c plan)   GP  Bayard Hugger. Manson Passey, [place] [phone] 10/23/2012, 10:27 AM

## 2012-10-23 NOTE — Consult Note (Signed)
Patient Identification:  SAMAIA IWATA Date of Evaluation:  10/23/2012 Reason for Consult: Capacity   Referring Provider:  Dr. Darnelle Catalan  History of Present Illness:Brentley Whipkey is re-admitted for respiratory symptoms of productive cough, malaise for  2 weeks.  She is diagnosed with H1N1 and Influeza A.  She  Is admitted.  Son and nephew state preference for SNF; patient declines stating she wants to return home.    Past Psychiatric History:Pt has received benzodiazepine and tricyclic antidepressant  She has hepatitis C with can contribute to altered mental status  Past Medical History:     Past Medical History  Diagnosis Date  . Glaucoma   . Diabetes mellitus without complication   . Hypertension   . Hepatitis C   . Pancreatitis   . Neuropathy       History reviewed. No pertinent past surgical history.  Allergies:  Allergies  Allergen Reactions  . Iohexol Other (See Comments)    "severe burning"  . Penicillins Hives    Current Medications:  Prior to Admission medications   Medication Sig Start Date End Date Taking? Authorizing Provider  ALPRAZolam Prudy Feeler) 1 MG tablet Take 1 mg by mouth 3 (three) times daily.   Yes Historical Provider, MD  amitriptyline (ELAVIL) 50 MG tablet Take 50 mg by mouth daily.     Yes Historical Provider, MD  brinzolamide (AZOPT) 1 % ophthalmic suspension 1 drop daily.     Yes Historical Provider, MD  cycloSPORINE (RESTASIS) 0.05 % ophthalmic emulsion 1 drop 2 (two) times daily.     Yes Historical Provider, MD  gabapentin (NEURONTIN) 300 MG capsule Take 300 mg by mouth 3 (three) times daily.     Yes Historical Provider, MD  glimepiride (AMARYL) 2 MG tablet Take 2 mg by mouth daily.     Yes Historical Provider, MD  HYDROcodone-acetaminophen (LORCET) 10-650 MG per tablet Take 1 tablet by mouth every 6 (six) hours as needed.     Yes Historical Provider, MD  HYDROmorphone (DILAUDID) 4 MG tablet Take 4-8 mg by mouth every 4 (four) hours as needed. pain   Yes  Historical Provider, MD  indomethacin (INDOCIN SR) 75 MG CR capsule Take 75 mg by mouth 2 (two) times daily.     Yes Historical Provider, MD  lisinopril (PRINIVIL,ZESTRIL) 5 MG tablet Take 5 mg by mouth daily.     Yes Historical Provider, MD  Loratadine (CLARITIN) 10 MG CAPS Take 10 mg by mouth daily.   Yes Historical Provider, MD  meclizine (ANTIVERT) 25 MG tablet Take 25 mg by mouth 3 (three) times daily as needed.     Yes Historical Provider, MD  methocarbamol (ROBAXIN) 750 MG tablet Take 750 mg by mouth 2 (two) times daily as needed. For muscle spasms     Yes Historical Provider, MD  omeprazole (PRILOSEC) 20 MG capsule Take 20 mg by mouth daily.     Yes Historical Provider, MD  polyethylene glycol (MIRALAX / GLYCOLAX) packet Take 17 g by mouth daily.   Yes Historical Provider, MD  promethazine (PHENERGAN) 25 MG tablet Take 50 mg by mouth 2 (two) times daily as needed. For nausea    Yes Historical Provider, MD  simvastatin (ZOCOR) 40 MG tablet Take 40 mg by mouth daily.     Yes Historical Provider, MD  sulfaSALAzine (AZULFIDINE) 500 MG tablet Take 1,000 mg by mouth 2 (two) times daily.     Yes Historical Provider, MD  traZODone (DESYREL) 50 MG tablet Take 50 mg by  mouth daily.     Yes Historical Provider, MD    Social History:    reports that she has quit smoking. She does not have any smokeless tobacco history on file. She reports that she drinks alcohol. She reports that she does not use illicit drugs.   Family History:    History reviewed. No pertinent family history.  Mental Status Examination/Evaluation: Objective:  Appearance: Guarded and extremely thin, undenourished  Eye Contact::  turns away from MD  Speech:  mumbles and incomplete setence responses; if at all  Volume:  Decreased; at times mute  Mood:  withdrawn  Affect:  Depressed, Flat and Inappropriate  Thought Process:  unable to oberve; does not chose to express thoughts or responses  Orientation:  Other:  Pt Date is  '23rd', no other commen; mo answer to series of questions.   Thought Content:  withdrawn  Suicidal Thoughts:  No  Homicidal Thoughts:  No  Judgement:  Impaired  Insight:  Lacking   DIAGNOSIS:   AXIS I   Depression related to other medical conditions  AXIS II  Deferred  AXIS III See medical notes.  AXIS IV economic problems, other psychosocial or environmental problems, problems related to social environment, problems with access to health care services and complex health problems Tox screen + for opiates  AXIS V 41-50 serious symptoms   Assessment/Plan: To Discussed with Dr. Darnelle Catalan Pt is lying in bed; acknowledges visit and reason for evaluation.  She turns away from MD and shifts position several times.  She does not state her birthday.  She only says date is '[birth year]rd' She does not answer any other questions.  She occasionally makes tangential remarks, relevant to her own thoughts or situations.  She does not say anything that sounds psychotic or delusional.  She simply declines to engage in conversation.     She says she has not been sleeping..  She admits she has 'pneumonia'. Evaluation is discontinued due to lack of patient participation.  Pt is either unwilling or lacks capacity to understand significance of participating in mental status exam.  She is encouraged several times but posture and lack of response does not change.  RECOMMENDATION:  1.  Unable to evaluate capacity unless, by son's corroboration, this is pt's baseline - in which case, she does lack capacity.  2.  No suggested change in medications at this time.  3.  Will follow pt.  Mickeal Skinner MD 10/23/2012 8:35 PM

## 2012-10-23 NOTE — Progress Notes (Signed)
Clinical Social Worker followed up with pt and pt nephew and pt son at bedside in regard to rehab at [facility]. Pt more alert today than yesterday and stating that she wants to return home and does not want SNF. Pt nephew and pt son report that between the two of them someone will be able to provide 24 hour assist. Clinical Social Worker notified MD who ordered psych evaluation for capacity. Clinical Social Worker to continue to follow and assist as appropriate.  Jacklynn Lewis, MSW, LCSWA  Clinical Social Work [phone]

## 2012-10-23 NOTE — Progress Notes (Signed)
[facility] PROGRESS NOTE  [name] ZOX:[id number] DOB: Jul 11, 1961 DOA: 10/11/2012 PCP: Jeri Cos, MD  Brief narrative: [name] is a 52 year old woman with past medical history of diabetes, hypertension, hepatitis C, chronic pain syndrome and pancreatitis who was admitted on 10/11/2012 with severe sepsis secondary to H1N1 pneumonitis versus ARDS, ventilatory dependent respiratory failure (intubated 10/14/2012---> 10/19/2012), under the care of the critical care team since 10/12/2012, transferred back to the hospitalist service on 10/21/2012.  Assessment/Plan: Principal Problem:  *Severe sepsis with septic shock, status post ventilatory dependent respiratory failure  Respiratory cultures grew normal flora. Influenza studies for H1N1 were positive (status post 5 days of therapy with Tamiflu). She has also completed a course of aztreonam, and azithromycin, and vancomycin for healthcare associated pneumonia. Blood cultures have been negative to date.  Intubated 10/14/2012-10/19/2012 for acute respiratory failure secondary to influenza associated ARDS. Respiratory status stable post extubation. Active Problems: Encephalopathy  The patient's mental status appears to be impaired. She is very slow to respond. She has a history of chronic opiate dependency. CT scan of the head done 10/21/2012 which showed global atrophy but no evidence of anoxic brain injury or stroke. Her family reports that her mental status is worse than she normally is at baseline, although she does have some underlying chronic mental health issues.  Refusing skilled nursing home placement. Threatening to leave AGAINST MEDICAL ADVICE. We have requested a psychiatry evaluation for evaluation of underlying psychiatric disease and for capacity to determine her discharge environment. This has been fully discussed with her family.  Diarrhea  Has rectal tube in place, which we will attempt to discontinue today. C. difficile  was negative 10/14/2012 and again 10/21/2012.   Continue Florastor.  DIABETES MELLITUS, TYPE II  CBGs 81-101. Not currently on any therapy. Hemoglobin A1c 5.5 on 10/11/2012, so unclear if the patient truly has a diagnosis of diabetes. Hemoglobin A1c range 5.4-5.6 consistently since 04/2007.  Home dose of Amaryl remains on hold.  ANXIETY / DEPRESSION  On Elavil, and Xanax at home. These medications are currently on hold.  ASTHMA  Monitor for bronchospasm.  Community acquired versus healthcare associated pneumonia  Completed a course of therapy with aztreonam, azithromycin, and vancomycin.  Bilateral lower abdominal discomfort / history of Crohn's disease  Monitor. Treat symptomatically.  H1N1 influenza  Status post Tamiflu x5 days.   Acute respiratory failure / ARDS (adult respiratory distress syndrome)  Intubated 10/14/2012 until 10/19/2012.  Metabolic acidosis  Secondary to sepsis. Resolved.  Hypernatremia  Likely from dehydration. Resolved.  Hypokalemia  Monitor and replace as needed.  Hypomagnesemia  Monitor and replace as needed.  Hypophosphatemia  Resolved.  Leukocytosis  Secondary to infection. White blood cell count trending down over time.  Normocytic anemia  Likely anemia of acute illness. No current indication for transfusion.  Thrombocytopenia  Likely secondary to sepsis. No evidence of acute bleeding problems.  Elevated INR  Likely secondary to sepsis. No evidence of acute bleeding problems  Code Status: Full. Family Communication: Diffuse updated at bedside. Disposition Plan: Suspect will need SNF.   Medical Consultants:  PCCM  Psychiatry  Other Consultants:  Physical therapy: Skilled nursing home recommended.  Occupational therapy: Skilled nursing home recommended.  Dietitian: Ensure supplements..  Anti-infectives:  Aztreonam 1/11 >> 1/19   Azithro 1/11 >> 1/19   Vanco 1/11 >>1/14   Oseltamivir 1/16 >>  1/20  HPI/Subjective: [name] remains belligerent, refusing to follow commands at times. She is insistent that she is going to leave the  hospital today despite being told that she is not medically stable enough to leave the hospital and despite skilled nursing home placement for rehabilitation recommendations.  Objective: Filed Vitals:   10/22/12 0608 10/22/12 1320 10/22/12 2219 10/23/12 0533  BP: 121/88 107/77 131/97 109/78  Pulse: 109 120 110 103  Temp: 99.2 F (37.3 C) 98.7 F (37.1 C) 98.9 F (37.2 C) 99.9 F (37.7 C)  TempSrc: Oral Oral Oral Oral  Resp: 20 20 16 20   Height:      Weight:      SpO2: 98% 98% 100% 95%    Intake/Output Summary (Last 24 hours) at 10/23/12 0955 Last data filed at 10/23/12 0512  Gross per 24 hour  Intake    360 ml  Output   2050 ml  Net  -1690 ml    Exam: Gen:  Slow to respond. Cardiovascular:  Tachycardic Respiratory:  Lungs diminished Gastrointestinal:  Abdomen soft, NT/ND, + BS Extremities:  No C/E/C  Data Reviewed: Basic Metabolic Panel:  Lab 10/22/12 1610 10/20/12 0400 10/19/12 0630 10/18/12 0500 10/17/12 0335  NA 139 145 141 139 145145  K 3.8 3.1* -- -- --  CL 104 112 110 111 117*117*  CO2 23 23 21 19  1919  GLUCOSE 94 107* 94 71 109*113*  BUN 6 7 11 13  1414  CREATININE 0.71 0.67 0.89 1.05 1.28*1.30*  CALCIUM 7.5* 7.2* 7.6* 7.5* 7.4*7.5*  MG 1.0* -- -- 1.3* 1.6  PHOS -- -- -- 3.43.4 2.0*1.9*   GFR Estimated Creatinine Clearance: 60 ml/min (by C-G formula based on Cr of 0.71). Liver Function Tests:  Lab 10/18/12 0500 10/17/12 0335  AST -- --  ALT -- --  ALKPHOS -- --  BILITOT -- --  PROT -- --  ALBUMIN 1.2* 1.2*   Coagulation profile  Lab 10/18/12 0500 10/17/12 0335  INR 1.33 1.48  PROTIME -- --    CBC:  Lab 10/22/12 0437 10/20/12 0400 10/19/12 0630 10/18/12 0500 10/17/12 0335  WBC 15.6* 11.1* 8.7 7.8 11.8*  NEUTROABS -- -- -- -- --  HGB 8.2* 8.1* 7.9* 8.0* 8.8*  HCT 25.4* 23.9* 23.9* 23.0*  24.4*  MCV 91.4 89.2 87.9 82.7 81.9  PLT 339 168 107* 71* 74*   CBG:  Lab 10/23/12 0714 10/22/12 2335 10/22/12 1601 10/22/12 0745 10/22/12 0146  GLUCAP 88 103* 91 81 101*   Microbiology Recent Results (from the past 240 hour(s))  CLOSTRIDIUM DIFFICILE BY PCR     Status: Normal   Collection Time   10/14/12  2:41 AM      Component Value Range Status Comment   C difficile by pcr NEGATIVE  NEGATIVE Final   STOOL CULTURE     Status: Normal   Collection Time   10/14/12  6:31 PM      Component Value Range Status Comment   Specimen Description STOOL   Final    Special Requests Normal   Final    Culture     Final    Value: ABUNDANT YEAST     NO SALMONELLA, SHIGELLA, CAMPYLOBACTER, YERSINIA, OR E.COLI 0157:H7 ISOLATED     Note: REDUCED NORMAL FLORA PRESENT   Report Status 10/18/2012 FINAL   Final   URINE CULTURE     Status: Normal   Collection Time   10/15/12  9:05 AM      Component Value Range Status Comment   Specimen Description URINE, CATHETERIZED   Final    Special Requests NONE   Final    Culture  Setup  Time 10/15/2012 17:34   Final    Colony Count NO GROWTH   Final    Culture NO GROWTH   Final    Report Status 10/16/2012 FINAL   Final   CULTURE, BLOOD (ROUTINE X 2)     Status: Normal   Collection Time   10/15/12  9:55 AM      Component Value Range Status Comment   Specimen Description BLOOD LEFT HAND   Final    Special Requests BOTTLES DRAWN AEROBIC AND ANAEROBIC 10CC   Final    Culture  Setup Time 10/15/2012 16:06   Final    Culture NO GROWTH 5 DAYS   Final    Report Status 10/21/2012 FINAL   Final   CULTURE, BLOOD (ROUTINE X 2)     Status: Normal   Collection Time   10/15/12 10:05 AM      Component Value Range Status Comment   Specimen Description BLOOD RIGHT HAND   Final    Special Requests BOTTLES DRAWN AEROBIC AND ANAEROBIC [facility]   Final    Culture  Setup Time 10/15/2012 16:06   Final    Culture NO GROWTH 5 DAYS   Final    Report Status 10/21/2012 FINAL   Final    CULTURE, RESPIRATORY     Status: Normal   Collection Time   10/15/12 11:25 AM      Component Value Range Status Comment   Specimen Description TRACHEAL ASPIRATE   Final    Special Requests NONE   Final    Gram Stain     Final    Value: RARE WBC PRESENT,BOTH PMN AND MONONUCLEAR     RARE SQUAMOUS EPITHELIAL CELLS PRESENT     NO ORGANISMS SEEN   Culture FEW CANDIDA ALBICANS   Final    Report Status 10/17/2012 FINAL   Final   CLOSTRIDIUM DIFFICILE BY PCR     Status: Normal   Collection Time   10/21/12  8:50 AM      Component Value Range Status Comment   C difficile by pcr NEGATIVE  NEGATIVE Final      Procedures and Diagnostic Studies: Dg Chest 1 View  10/14/2012  *RADIOLOGY REPORT*  Clinical Data: ETT, central line  CHEST - 1 VIEW  Comparison: 10/14/2012 at 0118 hours  Findings: Endotracheal tube terminates 5 mm above the carina and preferentially to dates the right mainstem bronchus.  Withdrawal approximately 2-3 cm is suggested.  Left IJ venous catheter terminates at the cavoatrial junction. Enteric tube courses into the stomach.  Stable diffuse coarse interstitial/airspace opacities. No pleural effusion or pneumothorax.  The heart is normal in size.  IMPRESSION: Endotracheal tube terminates 5 mm above the carina.  Withdrawal approximately 2-3 cm is suggested.  Left IJ venous catheter terminates at the cavoatrial junction. No pneumothorax.  Stable diffuse coarse interstitial/airspace opacities, possibly reflecting multifocal pneumonia or interstitial edema.   Original Report Authenticated By: Charline Bills, M.D.    Dg Chest 2 View  10/12/2012  *RADIOLOGY REPORT*  Clinical Data: Shortness of breath, weakness, diabetes, pneumonia  CHEST - 2 VIEW  Comparison: 10/11/2012  Findings: Extensive new bilateral airspace process versus edema. Stable heart size.  No effusion or pneumothorax.  Trachea is midline.  Airspace disease is more pronounced in the upper lobes.  IMPRESSION: Worsening diffuse  bilateral airspace process compared to yesterday could represent acute edema versus rapid progression of pneumonia.   Original Report Authenticated By: [name] Petit. Miles Costain, M.D.    Dg Chest Port 1  View  10/19/2012  *RADIOLOGY REPORT*  Clinical Data: Follow-up respiratory failure.  Endotracheal tube placement.  PORTABLE CHEST - 1 VIEW  Comparison: 10/18/2012  Findings: Endotracheal tube placed with tip about 4 cm above the carina.  Left central venous catheter with tip over the mid SVC region.  Shallow inspiration. Normal heart size and pulmonary vascularity.  Mild perihilar interstitial changes which could represent fibrosis or edema.  Peribronchial thickening. Basilar atelectasis is improving since the previous study.  IMPRESSION: Appliances are unchanged in position.  Diffuse interstitial changes in the lungs may represent fibrosis or edema.  Improving basilar atelectasis since previously.   Original Report Authenticated By: Burman Nieves, M.D.    Dg Chest Port 1 View  10/18/2012  *RADIOLOGY REPORT*  Clinical Data: Evaluate endotracheal tube, central line, and chest disease.  PORTABLE CHEST - 1 VIEW  Comparison: 10/17/2012  Findings: Endotracheal tube tip measures 4.3 cm above the carina. Left central venous catheter tip over the mid SVC region.  Normal heart size and pulmonary vascularity. Diffuse interstitial changes and bilateral basilar infiltration appears stable.  No pneumothorax.  No significant change since yesterday.  IMPRESSION: Stable appearance of the chest since yesterday.   Original Report Authenticated By: Burman Nieves, M.D.    Dg Chest Port 1 View  10/17/2012  *RADIOLOGY REPORT*  Clinical Data: Airspace disease.  Ventilator patient.  PORTABLE CHEST - 1 VIEW  Comparison: 10/16/2012  Findings: Endotracheal tube tip measures 441 cm above the carina. Enteric tube tip is not visualized but is below left hemidiaphragm consistent with location in the distal stomach.  Left central venous catheter with  tip overlying the mid SVC region.  Shallow inspiration.  Normal heart size.  A suggestion of pulmonary vascular congestion.  Diffuse interstitial pattern in the lungs could represent edema, ARDS, or preexisting fibrosis.  No pneumothorax.  Overall stable appearance since previous study.  IMPRESSION: No significant change since yesterday.  Appliances remain in satisfactory apparent location.  Diffuse interstitial disease with possible pulmonary vascular congestion.  Normal heart size.   Original Report Authenticated By: Burman Nieves, M.D.    Dg Chest Port 1 View  10/16/2012  *RADIOLOGY REPORT*  Clinical Data: Endotracheal tube.  PORTABLE CHEST - 1 VIEW  Comparison: 10/15/2012.  Findings: The endotracheal tube tip is 27 mm from the carina.  Left IJ central line and enteric tube remain present.  Diffuse bilateral airspace disease is present, without interval change compared yesterday.  Cardiopericardial silhouette and mediastinal contours are also unchanged.  IMPRESSION: No interval change.  Stable support apparatus.   Original Report Authenticated By: Andreas Newport, M.D.    Dg Chest Port 1 View  10/15/2012  *RADIOLOGY REPORT*  Clinical Data: Endotracheal tube  PORTABLE CHEST - 1 VIEW  Comparison: Chest radiograph 10/14/2012  Findings: Endotracheal tube, NG tube, and left central venous line are unchanged.  Stable cardiac silhouette.  There is diffuse fine air space disease which is stable compared to prior with no improvement.  No pneumothorax  IMPRESSION:  1.  Stable support apparatus. 2.  Extensive diffuse bilateral air space disease is not improved.   Original Report Authenticated By: Genevive Bi, M.D.    Dg Chest Port 1 View  10/14/2012  *RADIOLOGY REPORT*  Clinical Data: Intubation  PORTABLE CHEST - 1 VIEW  Comparison: 10/14/2012  Findings: Endotracheal tube 3.7 cm above the carina.  Left IJ central line in the SVC RA junction.  NG tube in the stomach. Interval improvement in the diffuse mixed  interstitial and  airspace process.  The cardiac borders and hemidiaphragms are better visualize.  No enlarging effusion or pneumothorax.  Monitor leads overlie the chest.  IMPRESSION: Endotracheal tube 3.7 cm above the carina.  Improving diffuse airspace process versus edema.   Original Report Authenticated By: [name] Petit. Miles Costain, M.D.    Dg Chest Port 1 View  10/14/2012  *RADIOLOGY REPORT*  Clinical Data: Shortness of breath, confusion.  PORTABLE CHEST - 1 VIEW  Comparison:   the previous day's study  Findings: Moderate diffuse alveolar opacities throughout both lungs, largely stable.  Heart size upper limits normal.  No definite effusion.  Regional bones unremarkable.  IMPRESSION:  Little change in bilateral infiltrates or edema.   Original Report Authenticated By: D. Andria Rhein, MD    Dg Chest Port 1 View  10/13/2012  *RADIOLOGY REPORT*  Clinical Data: Follow up infiltrates  PORTABLE CHEST - 1 VIEW  Comparison: 10/12/2012  Findings: Severe diffuse bilateral airspace disease is present. This shows mild progression bilaterally.  No effusion.  Heart size is normal.  IMPRESSION: Mild progression of diffuse bilateral airspace disease .   Original Report Authenticated By: Janeece Riggers, M.D.    Dg Chest Port 1 View  10/11/2012  *RADIOLOGY REPORT*  Clinical Data: 52 year old female with shortness of breath cough and chest pain.  PORTABLE CHEST - 1 VIEW  Comparison: 07/29/2011 and earlier.  Findings: Portable semi upright AP view 1901 hours.  No asymmetric right upper lobe opacity is faint and nodular.  Appears to abut the right minor fissure.  Stable lung volumes.  Cardiac size and mediastinal contours are within normal limits.  Visualized tracheal air column is within normal limits.  No pneumothorax or effusion.  IMPRESSION: No asymmetric right upper lobe opacity, nonspecific but favor developing right upper lobe pneumonia.   Original Report Authenticated By: Erskine Speed, M.D.    Dg Abd Portable 1v  10/18/2012   *RADIOLOGY REPORT*  Clinical Data: 52 year old female - OG tube placement.  PORTABLE ABDOMEN - 1 VIEW  Comparison: 10/14/2012 radiograph  Findings: An OG tube is identified with tip overlying the mid stomach. Mild gaseous distention of the colon is noted. No distended small bowel loops are present.  IMPRESSION: OG tube with tip overlying the mid stomach.   Original Report Authenticated By: Harmon Pier, M.D.    Dg Abd Portable 2v  10/14/2012  *RADIOLOGY REPORT*  Clinical Data: Abdominal distention  PORTABLE ABDOMEN - 2 VIEW  Comparison: 07/30/2011  Findings: Scattered air and stool throughout the bowel.  NG tube in the stomach.  Monitor leads overlie the chest.  No definite free air on the decubitus view.  IMPRESSION: Negative for obstruction or free air.   Original Report Authenticated By: [name] Petit. Miles Costain, M.D.    Ct Head Wo Contrast  10/21/2012  *RADIOLOGY REPORT*  Clinical Data: Weakness and altered mental status.  Rule out anoxic brain injury.  History of intubation for respiratory failure.  CT HEAD WITHOUT CONTRAST  Technique:  Contiguous axial images were obtained from the base of the skull through the vertex without contrast.  Comparison: None.  Findings: No intracranial hemorrhage.  Global atrophy advance for patient's age as are the presence of vascular calcifications.  No hydrocephalus.  Mild white matter type changes may reflect changes of small vessel disease without CT evidence of large acute infarct or significant anoxic injury.  No intracranial mass lesion detected on this unenhanced exam.  IMPRESSION: Global atrophy advance for patient's age as are the presence of vascular calcifications.  Mild  white matter type changes may reflect changes of small vessel disease without CT evidence of large acute infarct or significant anoxic injury.   Original Report Authenticated By: Lacy Duverney, M.D.    Scheduled Meds:    . antiseptic oral rinse  15 mL Mouth Rinse QID  . brinzolamide  1 drop Both Eyes Daily   . cycloSPORINE  1 drop Both Eyes BID  . feeding supplement  237 mL Oral BID BM  . folic acid  1 mg Oral Daily  . potassium chloride  20 mEq Oral BID  . saccharomyces boulardii  250 mg Oral BID  . thiamine  100 mg Oral Daily   Continuous Infusions:   Time spent: 25 minutes.   LOS: 12 days   Puja Caffey  [facility] Pager [phone].  If 8PM-8AM, please contact night-coverage at [email or web address], password [facility] 10/23/2012, 9:55 AM

## 2012-10-24 DIAGNOSIS — R489 Unspecified symbolic dysfunctions: Secondary | ICD-10-CM | POA: Diagnosis not present

## 2012-10-24 DIAGNOSIS — R6520 Severe sepsis without septic shock: Secondary | ICD-10-CM | POA: Diagnosis not present

## 2012-10-24 DIAGNOSIS — J984 Other disorders of lung: Secondary | ICD-10-CM | POA: Diagnosis not present

## 2012-10-24 DIAGNOSIS — R279 Unspecified lack of coordination: Secondary | ICD-10-CM | POA: Diagnosis not present

## 2012-10-24 DIAGNOSIS — E119 Type 2 diabetes mellitus without complications: Secondary | ICD-10-CM | POA: Diagnosis not present

## 2012-10-24 DIAGNOSIS — A4159 Other Gram-negative sepsis: Secondary | ICD-10-CM | POA: Diagnosis not present

## 2012-10-24 DIAGNOSIS — M6281 Muscle weakness (generalized): Secondary | ICD-10-CM | POA: Diagnosis not present

## 2012-10-24 DIAGNOSIS — R262 Difficulty in walking, not elsewhere classified: Secondary | ICD-10-CM | POA: Diagnosis not present

## 2012-10-24 DIAGNOSIS — J1100 Influenza due to unidentified influenza virus with unspecified type of pneumonia: Secondary | ICD-10-CM | POA: Diagnosis not present

## 2012-10-24 DIAGNOSIS — J189 Pneumonia, unspecified organism: Secondary | ICD-10-CM | POA: Diagnosis not present

## 2012-10-24 DIAGNOSIS — J9600 Acute respiratory failure, unspecified whether with hypoxia or hypercapnia: Secondary | ICD-10-CM | POA: Diagnosis not present

## 2012-10-24 DIAGNOSIS — E874 Mixed disorder of acid-base balance: Secondary | ICD-10-CM | POA: Diagnosis not present

## 2012-10-24 DIAGNOSIS — D649 Anemia, unspecified: Secondary | ICD-10-CM | POA: Diagnosis not present

## 2012-10-24 DIAGNOSIS — A419 Sepsis, unspecified organism: Secondary | ICD-10-CM | POA: Diagnosis not present

## 2012-10-24 DIAGNOSIS — N179 Acute kidney failure, unspecified: Secondary | ICD-10-CM | POA: Diagnosis not present

## 2012-10-24 DIAGNOSIS — J129 Viral pneumonia, unspecified: Secondary | ICD-10-CM | POA: Diagnosis not present

## 2012-10-24 DIAGNOSIS — J9589 Other postprocedural complications and disorders of respiratory system, not elsewhere classified: Secondary | ICD-10-CM | POA: Diagnosis not present

## 2012-10-24 DIAGNOSIS — G9340 Encephalopathy, unspecified: Secondary | ICD-10-CM | POA: Diagnosis not present

## 2012-10-24 LAB — BASIC METABOLIC PANEL
Calcium: 8.2 mg/dL — ABNORMAL LOW (ref 8.4–10.5)
Creatinine, Ser: 0.75 mg/dL (ref 0.50–1.10)
GFR calc Af Amer: >90 mL/min (ref >90)
GFR calc non Af Amer: >90 mL/min (ref >90)
Sodium: 137 mEq/L (ref 135–145)

## 2012-10-24 LAB — MAGNESIUM: Magnesium: 1.9 mg/dL (ref 1.5–2.5)

## 2012-10-24 LAB — CBC
MCV: 93 fL (ref 78.0–100.0)
Platelets: 580 10*3/uL — ABNORMAL HIGH (ref 150–400)
RBC: 3.15 MIL/uL — ABNORMAL LOW (ref 3.87–5.11)
RDW: 17.9 % — ABNORMAL HIGH (ref 11.5–15.5)
WBC: 11.1 10*3/uL — ABNORMAL HIGH (ref 4.0–10.5)

## 2012-10-24 LAB — GLUCOSE, CAPILLARY
Glucose-Capillary: 71 mg/dL (ref 70–99)
Glucose-Capillary: 87 mg/dL (ref 70–99)
Glucose-Capillary: 93 mg/dL (ref 70–99)

## 2012-10-24 MED ORDER — METHOCARBAMOL 750 MG PO TABS: 750.0000 mg | ORAL_TABLET | Freq: Two times a day (BID) | ORAL | Status: DC | PRN

## 2012-10-24 MED ORDER — AMITRIPTYLINE HCL 50 MG PO TABS: 50.0000 mg | ORAL_TABLET | Freq: Every day | ORAL | Status: DC

## 2012-10-24 MED ORDER — SODIUM POLYSTYRENE SULFONATE 15 GM/60ML PO SUSP
15.0000 g | Freq: Once | ORAL | Status: AC
  Administered 2012-10-24: 15 g via ORAL
  Filled 2012-10-24: qty 60

## 2012-10-24 MED ORDER — SODIUM POLYSTYRENE SULFONATE 15 GM/60ML PO SUSP
45.0000 g | ORAL | Status: AC
  Administered 2012-10-24: 45 g via ORAL
  Filled 2012-10-24: qty 180

## 2012-10-24 MED ORDER — FOLIC ACID 1 MG PO TABS: 1.0000 mg | ORAL_TABLET | Freq: Every day | ORAL | Status: DC

## 2012-10-24 MED ORDER — GABAPENTIN 300 MG PO CAPS: 300.0000 mg | ORAL_CAPSULE | Freq: Three times a day (TID) | ORAL | Status: DC

## 2012-10-24 MED ORDER — HYDROMORPHONE HCL 4 MG PO TABS: 4.0000 mg | ORAL_TABLET | ORAL | Status: DC | PRN

## 2012-10-24 MED ORDER — THIAMINE HCL 100 MG PO TABS: 100.0000 mg | ORAL_TABLET | Freq: Every day | ORAL | Status: DC

## 2012-10-24 MED ORDER — HYDROCODONE-ACETAMINOPHEN 10-650 MG PO TABS: 1.0000 | ORAL_TABLET | Freq: Four times a day (QID) | ORAL | Status: DC | PRN

## 2012-10-24 MED ORDER — ALPRAZOLAM 1 MG PO TABS: 1.0000 mg | ORAL_TABLET | Freq: Three times a day (TID) | ORAL | Status: DC

## 2012-10-24 MED ORDER — FUROSEMIDE 40 MG PO TABS
40.0000 mg | ORAL_TABLET | Freq: Once | ORAL | Status: AC
  Administered 2012-10-24: 40 mg via ORAL
  Filled 2012-10-24: qty 1

## 2012-10-24 NOTE — Progress Notes (Signed)
Occupational Therapy Treatment Patient Details Name: KAYLENN CIVIL MRN: [id number] DOB: [birth year] Today's Date: 10/24/2012 Time: 8469-6295 OT Time Calculation (min): 32 min  OT Assessment / Plan / Recommendation Comments on Treatment Session Pt a little clearer cognitively but still with deficits in most areas.  She was aggravated with family when I got there but was agreeable to working with OT with max encouragement.  I could not engage her in functional activities however.      Follow Up Recommendations  SNF    Barriers to Discharge       Equipment Recommendations  3 in 1 bedside comode    Recommendations for Other Services    Frequency Min 1X/week   Plan Discharge plan remains appropriate    Precautions / Restrictions Precautions Precautions: Fall Restrictions Weight Bearing Restrictions: No   Pertinent Vitals/Pain No c/o pain    ADL  Transfers/Ambulation Related to ADLs: Pt still with distractability.  Family in room and pt arguing with them.  Sat up at eob with supervision, bed rails and extra time.   ADL Comments: Sat eob x 10 minutes but would not engage in functional activities:  did not like our toothpaste, did not want to do other adls.  Pt was able to tell me that she used biotene earlier.      OT Diagnosis:    OT Problem List:   OT Treatment Interventions:     OT Goals ADL Goals Pt Will Perform Grooming: Sitting, edge of bed;Unsupported;with set-up;with supervision ADL Goal: Grooming - Progress: Not progressing (due to decreased engagement)  Visit Information  Last OT Received On: 10/24/12 Assistance Needed: +1    Subjective Data      Prior Functioning       Cognition  Overall Cognitive Status: Impaired Area of Impairment: Memory;Safety/judgement;Problem solving;Attention (decreased initiation:  some of it her will) Arousal/Alertness: Awake/alert Orientation Level: Disoriented to;Time;Situation Behavior During Session: Agitated (upset with family  ) Current Attention Level: Focused    Mobility  Shoulder Instructions Bed Mobility Supine to Sit: 5: Supervision;With rails Sit to Supine: 5: Supervision;With rail       Exercises      Balance Static Sitting Balance Static Sitting - Balance Support: No upper extremity supported Static Sitting - Level of Assistance: 7: Independent   End of Session OT - End of Session Patient left: in bed;with call bell/phone within reach;with family/visitor present  GO     Ladarien Beeks 10/24/2012, 2:57 PM Marica Otter, OTR/L [phone] 10/24/2012

## 2012-10-24 NOTE — Progress Notes (Signed)
Updated Sherry at [facility] of potassium results, and asked that they recheck potassium in 2-3 days.  Patient will be transported via ambulance.  Philomena Doheny RN

## 2012-10-24 NOTE — Discharge Summary (Addendum)
Physician Discharge Summary  [name]:[id number] DOB: 1961/05/21 DOA: 10/11/2012  PCP: Jeri Cos, MD  Admit date: 10/11/2012 Discharge date: 10/24/2012  Recommendations for Outpatient Follow-up:  1. Followup with PCP in one to 2 weeks or sooner if symptoms worsen 2. Please note that we have recheck potassium after patient was given Kayexalate prior to discharge with follow up potassium 5.5.  Please recheck potassium tomorrow and if still high give additional kayexalate 45 gm.  Discharge Diagnoses:  Principal Problem:  *Sepsis Active Problems:  DIABETES MELLITUS, TYPE II  ANXIETY  DEPRESSION  ASTHMA  Community acquired pneumonia  Bilateral lower abdominal discomfort  H1N1 influenza  Acute respiratory failure  ARDS (adult respiratory distress syndrome)  Crohn's disease  Metabolic acidosis  Hypernatremia  Hypokalemia  Hypomagnesemia  Hypophosphatemia  Leukocytosis  Normocytic anemia  Thrombocytopenia  Elevated INR  Respiratory failure  Encephalopathy   Discharge Condition: Medically stable for discharge to skilled nursing facility today  Diet recommendation: As tolerated  History of present illness:  [name] is a 52 year old woman with past medical history of diabetes, hypertension, hepatitis C, chronic pain syndrome and pancreatitis who was admitted on 10/11/2012 with severe sepsis secondary to H1N1 pneumonitis versus ARDS, ventilatory dependent respiratory failure (intubated 10/14/2012---> 10/19/2012), under the care of the critical care team since 10/12/2012, transferred back to the hospitalist service on 10/21/2012.   Assessment/Plan:   Principal Problem:  *Severe sepsis with septic shock, status post ventilatory dependent respiratory failure  Respiratory cultures grew normal flora. Influenza studies for H1N1 were positive (status post 5 days of therapy with Tamiflu). She has also completed a course of aztreonam, and azithromycin, and vancomycin for  healthcare associated pneumonia. Blood cultures have been negative to date.  Intubated 10/14/2012-10/19/2012 for acute respiratory failure secondary to influenza associated ARDS. Respiratory status stable post extubation.  Active Problems:  Encephalopathy  The patient's mental status appears to be impaired. She is very slow to respond. She has a history of chronic opiate dependency. CT scan of the head done 10/21/2012 which showed global atrophy but no evidence of anoxic brain injury or stroke. Her family reports that her mental status is worse than she normally is at baseline, although she does have some underlying chronic mental health issues.  Psychiatry consult requested for capacity however is his patient does not participate in conversation this is hard to determine however at this time it is safe to say that patient has no capacity to make decisions regarding her medical treatment. Diarrhea  Rectal tube discontinued. C. difficile was negative 10/14/2012 and again 10/21/2012.  Continue Florastor. DIABETES MELLITUS, TYPE II  Hemoglobin A1c 5.5 10/11/2012. We will continue Amaryl for now. ANXIETY / DEPRESSION  Will continue medications per home care regimen since patient will go to skilled nursing facility where she will have 24-hour supervision ASTHMA  Monitor for bronchospasm. Community acquired versus healthcare associated pneumonia  Completed a course of therapy with aztreonam, azithromycin, and vancomycin. Bilateral lower abdominal discomfort / history of Crohn's disease  Monitor. Treat symptomatically. H1N1 influenza  Status post Tamiflu x 5 days.  Acute respiratory failure / ARDS (adult respiratory distress syndrome)  Intubated 10/14/2012 until 10/19/2012. Metabolic acidosis  Secondary to sepsis. Resolved. Hypernatremia  Likely from dehydration. Resolved. Hypokalemia  Monitor and replace as needed. That developed hyperkalemia with potassium of 6. Patient given Kayexalate with  followup potassium which did not significantly go down, 5.9. However i was notified that the patient refused to take Kayexalate for which reason her potassium  status same. We have finally manage to give her Kayexalate in addition to Lasix 40 mg by mouth. Potassium level noted above. Hypomagnesemia  Monitor and replace as needed. Resolved Hypophosphatemia  Resolved. Leukocytosis  Secondary to infection. White blood cell count trending down, 11.4 today Normocytic anemia  Likely anemia of acute illness. No current indication for transfusion. Hemoglobin stable at 9.4 Thrombocytopenia  Likely secondary to sepsis. No evidence of acute bleeding problems. Elevated INR  Likely secondary to sepsis. No evidence of acute bleeding problems   Code Status: Full.  Family Communication: Diffuse updated at bedside.  Disposition Plan: SNF today   Medical Consultants:  PCCM  Psychiatry Other Consultants:  Physical therapy: Skilled nursing home recommended.  Occupational therapy: Skilled nursing home recommended.  Dietitian: Ensure supplements.. Anti-infectives:  Aztreonam 1/11 >> 1/19  Azithro 1/11 >> 1/19  Vanco 1/11 >>1/14  Oseltamivir 1/16 >> 1/20  Discharge Exam: Filed Vitals:   10/24/12 0421  BP: 93/57  Pulse: 108  Temp: 99 F (37.2 C)  Resp: 16   Filed Vitals:   10/23/12 0533 10/23/12 1319 10/23/12 2108 10/24/12 0421  BP: 109/78 110/77 112/83 93/57  Pulse: 103 104 94 108  Temp: 99.9 F (37.7 C) 98.2 F (36.8 C) 98.2 F (36.8 C) 99 F (37.2 C)  TempSrc: Oral Oral Oral Oral  Resp: 20 20 16 16   Height:      Weight:      SpO2: 95% 100% 99% 99%    General: Pt is not in acute distress Cardiovascular: Regular rate and rhythm, S1/S2 +, no murmurs, no rubs, no gallops Respiratory: Clear to auscultation bilaterally, no wheezing, no crackles, no rhonchi Abdominal: Soft, non tender, non distended, bowel sounds +, no guarding Extremities: no edema, no cyanosis, pulses palpable  bilaterally DP and PT Neuro: Grossly nonfocal  Discharge Instructions  Discharge Orders    Future Orders Please Complete By Expires   Diet - low sodium heart healthy      Increase activity slowly      Call MD for:  persistant nausea and vomiting      Call MD for:  severe uncontrolled pain      Call MD for:  difficulty breathing, headache or visual disturbances      Call MD for:  persistant dizziness or light-headedness          Medication List     As of 10/24/2012 12:37 PM    TAKE these medications         ALPRAZolam 1 MG tablet   Commonly known as: XANAX   Take 1 tablet (1 mg total) by mouth 3 (three) times daily.      amitriptyline 50 MG tablet   Commonly known as: ELAVIL   Take 1 tablet (50 mg total) by mouth daily.      brinzolamide 1 % ophthalmic suspension   Commonly known as: AZOPT   1 drop daily.      CLARITIN 10 MG Caps   Generic drug: Loratadine   Take 10 mg by mouth daily.      cycloSPORINE 0.05 % ophthalmic emulsion   Commonly known as: RESTASIS   1 drop 2 (two) times daily.      folic acid 1 MG tablet   Commonly known as: FOLVITE   Take 1 tablet (1 mg total) by mouth daily.      gabapentin 300 MG capsule   Commonly known as: NEURONTIN   Take 1 capsule (300 mg total) by mouth 3 (three)  times daily.      glimepiride 2 MG tablet   Commonly known as: AMARYL   Take 2 mg by mouth daily.      HYDROcodone-acetaminophen 10-650 MG per tablet   Commonly known as: LORCET   Take 1 tablet by mouth every 6 (six) hours as needed.      HYDROmorphone 4 MG tablet   Commonly known as: DILAUDID   Take 1-2 tablets (4-8 mg total) by mouth every 4 (four) hours as needed. pain      indomethacin 75 MG CR capsule   Commonly known as: INDOCIN SR   Take 75 mg by mouth 2 (two) times daily.      lisinopril 5 MG tablet   Commonly known as: PRINIVIL,ZESTRIL   Take 5 mg by mouth daily.      meclizine 25 MG tablet   Commonly known as: ANTIVERT   Take 25 mg by mouth 3  (three) times daily as needed.      methocarbamol 750 MG tablet   Commonly known as: ROBAXIN   Take 1 tablet (750 mg total) by mouth 2 (two) times daily as needed. For muscle spasms      omeprazole 20 MG capsule   Commonly known as: PRILOSEC   Take 20 mg by mouth daily.      polyethylene glycol packet   Commonly known as: MIRALAX / GLYCOLAX   Take 17 g by mouth daily.      promethazine 25 MG tablet   Commonly known as: PHENERGAN   Take 50 mg by mouth 2 (two) times daily as needed. For nausea      simvastatin 40 MG tablet   Commonly known as: ZOCOR   Take 40 mg by mouth daily.      sulfaSALAzine 500 MG tablet   Commonly known as: AZULFIDINE   Take 1,000 mg by mouth 2 (two) times daily.      thiamine 100 MG tablet   Take 1 tablet (100 mg total) by mouth daily.      traZODone 50 MG tablet   Commonly known as: DESYREL   Take 50 mg by mouth daily.           Follow-up Information    Follow up with Jeri Cos, MD. In 2 weeks.   Contact information:   [address] [place] [address] [phone]           The results of significant diagnostics from this hospitalization (including imaging, microbiology, ancillary and laboratory) are listed below for reference.    Significant Diagnostic Studies: Dg Chest 1 View  10/14/2012  *RADIOLOGY REPORT*  Clinical Data: ETT, central line  CHEST - 1 VIEW  Comparison: 10/14/2012 at 0118 hours  Findings: Endotracheal tube terminates 5 mm above the carina and preferentially to dates the right mainstem bronchus.  Withdrawal approximately 2-3 cm is suggested.  Left IJ venous catheter terminates at the cavoatrial junction. Enteric tube courses into the stomach.  Stable diffuse coarse interstitial/airspace opacities. No pleural effusion or pneumothorax.  The heart is normal in size.  IMPRESSION: Endotracheal tube terminates 5 mm above the carina.  Withdrawal approximately 2-3 cm is suggested.  Left IJ  venous catheter terminates at the cavoatrial junction. No pneumothorax.  Stable diffuse coarse interstitial/airspace opacities, possibly reflecting multifocal pneumonia or interstitial edema.   Original Report Authenticated By: Charline Bills, M.D.    Dg Chest 2 View  10/12/2012  *RADIOLOGY REPORT*  Clinical Data: Shortness  of breath, weakness, diabetes, pneumonia  CHEST - 2 VIEW  Comparison: 10/11/2012  Findings: Extensive new bilateral airspace process versus edema. Stable heart size.  No effusion or pneumothorax.  Trachea is midline.  Airspace disease is more pronounced in the upper lobes.  IMPRESSION: Worsening diffuse bilateral airspace process compared to yesterday could represent acute edema versus rapid progression of pneumonia.   Original Report Authenticated By: Judie Petit. Miles Costain, M.D.    Ct Head Wo Contrast  10/21/2012  *RADIOLOGY REPORT*  Clinical Data: Weakness and altered mental status.  Rule out anoxic brain injury.  History of intubation for respiratory failure.  CT HEAD WITHOUT CONTRAST  Technique:  Contiguous axial images were obtained from the base of the skull through the vertex without contrast.  Comparison: None.  Findings: No intracranial hemorrhage.  Global atrophy advance for patient's age as are the presence of vascular calcifications.  No hydrocephalus.  Mild white matter type changes may reflect changes of small vessel disease without CT evidence of large acute infarct or significant anoxic injury.  No intracranial mass lesion detected on this unenhanced exam.  IMPRESSION: Global atrophy advance for patient's age as are the presence of vascular calcifications.  Mild white matter type changes may reflect changes of small vessel disease without CT evidence of large acute infarct or significant anoxic injury.   Original Report Authenticated By: Lacy Duverney, M.D.    Dg Chest Port 1 View  10/19/2012  *RADIOLOGY REPORT*  Clinical Data: Follow-up respiratory failure.  Endotracheal tube  placement.  PORTABLE CHEST - 1 VIEW  Comparison: 10/18/2012  Findings: Endotracheal tube placed with tip about 4 cm above the carina.  Left central venous catheter with tip over the mid SVC region.  Shallow inspiration. Normal heart size and pulmonary vascularity.  Mild perihilar interstitial changes which could represent fibrosis or edema.  Peribronchial thickening. Basilar atelectasis is improving since the previous study.  IMPRESSION: Appliances are unchanged in position.  Diffuse interstitial changes in the lungs may represent fibrosis or edema.  Improving basilar atelectasis since previously.   Original Report Authenticated By: Burman Nieves, M.D.    Dg Chest Port 1 View  10/18/2012  *RADIOLOGY REPORT*  Clinical Data: Evaluate endotracheal tube, central line, and chest disease.  PORTABLE CHEST - 1 VIEW  Comparison: 10/17/2012  Findings: Endotracheal tube tip measures 4.3 cm above the carina. Left central venous catheter tip over the mid SVC region.  Normal heart size and pulmonary vascularity. Diffuse interstitial changes and bilateral basilar infiltration appears stable.  No pneumothorax.  No significant change since yesterday.  IMPRESSION: Stable appearance of the chest since yesterday.   Original Report Authenticated By: Burman Nieves, M.D.    Dg Chest Port 1 View  10/17/2012  *RADIOLOGY REPORT*  Clinical Data: Airspace disease.  Ventilator patient.  PORTABLE CHEST - 1 VIEW  Comparison: 10/16/2012  Findings: Endotracheal tube tip measures 441 cm above the carina. Enteric tube tip is not visualized but is below left hemidiaphragm consistent with location in the distal stomach.  Left central venous catheter with tip overlying the mid SVC region.  Shallow inspiration.  Normal heart size.  A suggestion of pulmonary vascular congestion.  Diffuse interstitial pattern in the lungs could represent edema, ARDS, or preexisting fibrosis.  No pneumothorax.  Overall stable appearance since previous study.   IMPRESSION: No significant change since yesterday.  Appliances remain in satisfactory apparent location.  Diffuse interstitial disease with possible pulmonary vascular congestion.  Normal heart size.   Original Report Authenticated By: Chrissie Noa  Andria Meuse, M.D.    Dg Chest Port 1 View  10/16/2012  *RADIOLOGY REPORT*  Clinical Data: Endotracheal tube.  PORTABLE CHEST - 1 VIEW  Comparison: 10/15/2012.  Findings: The endotracheal tube tip is 27 mm from the carina.  Left IJ central line and enteric tube remain present.  Diffuse bilateral airspace disease is present, without interval change compared yesterday.  Cardiopericardial silhouette and mediastinal contours are also unchanged.  IMPRESSION: No interval change.  Stable support apparatus.   Original Report Authenticated By: Andreas Newport, M.D.    Dg Chest Port 1 View  10/15/2012  *RADIOLOGY REPORT*  Clinical Data: Endotracheal tube  PORTABLE CHEST - 1 VIEW  Comparison: Chest radiograph 10/14/2012  Findings: Endotracheal tube, NG tube, and left central venous line are unchanged.  Stable cardiac silhouette.  There is diffuse fine air space disease which is stable compared to prior with no improvement.  No pneumothorax  IMPRESSION:  1.  Stable support apparatus. 2.  Extensive diffuse bilateral air space disease is not improved.   Original Report Authenticated By: Genevive Bi, M.D.    Dg Chest Port 1 View  10/14/2012  *RADIOLOGY REPORT*  Clinical Data: Intubation  PORTABLE CHEST - 1 VIEW  Comparison: 10/14/2012  Findings: Endotracheal tube 3.7 cm above the carina.  Left IJ central line in the SVC RA junction.  NG tube in the stomach. Interval improvement in the diffuse mixed interstitial and airspace process.  The cardiac borders and hemidiaphragms are better visualize.  No enlarging effusion or pneumothorax.  Monitor leads overlie the chest.  IMPRESSION: Endotracheal tube 3.7 cm above the carina.  Improving diffuse airspace process versus edema.   Original  Report Authenticated By: Judie Petit. Miles Costain, M.D.    Dg Chest Port 1 View  10/14/2012  *RADIOLOGY REPORT*  Clinical Data: Shortness of breath, confusion.  PORTABLE CHEST - 1 VIEW  Comparison:   the previous day's study  Findings: Moderate diffuse alveolar opacities throughout both lungs, largely stable.  Heart size upper limits normal.  No definite effusion.  Regional bones unremarkable.  IMPRESSION:  Little change in bilateral infiltrates or edema.   Original Report Authenticated By: D. Andria Rhein, MD    Dg Chest Port 1 View  10/13/2012  *RADIOLOGY REPORT*  Clinical Data: Follow up infiltrates  PORTABLE CHEST - 1 VIEW  Comparison: 10/12/2012  Findings: Severe diffuse bilateral airspace disease is present. This shows mild progression bilaterally.  No effusion.  Heart size is normal.  IMPRESSION: Mild progression of diffuse bilateral airspace disease .   Original Report Authenticated By: Janeece Riggers, M.D.    Dg Chest Port 1 View  10/11/2012  *RADIOLOGY REPORT*  Clinical Data: 52 year old female with shortness of breath cough and chest pain.  PORTABLE CHEST - 1 VIEW  Comparison: 07/29/2011 and earlier.  Findings: Portable semi upright AP view 1901 hours.  No asymmetric right upper lobe opacity is faint and nodular.  Appears to abut the right minor fissure.  Stable lung volumes.  Cardiac size and mediastinal contours are within normal limits.  Visualized tracheal air column is within normal limits.  No pneumothorax or effusion.  IMPRESSION: No asymmetric right upper lobe opacity, nonspecific but favor developing right upper lobe pneumonia.   Original Report Authenticated By: Erskine Speed, M.D.    Dg Abd Portable 1v  10/18/2012  *RADIOLOGY REPORT*  Clinical Data: 52 year old female - OG tube placement.  PORTABLE ABDOMEN - 1 VIEW  Comparison: 10/14/2012 radiograph  Findings: An OG tube is identified with tip overlying  the mid stomach. Mild gaseous distention of the colon is noted. No distended small bowel loops are  present.  IMPRESSION: OG tube with tip overlying the mid stomach.   Original Report Authenticated By: Harmon Pier, M.D.    Dg Abd Portable 2v  10/14/2012  *RADIOLOGY REPORT*  Clinical Data: Abdominal distention  PORTABLE ABDOMEN - 2 VIEW  Comparison: 07/30/2011  Findings: Scattered air and stool throughout the bowel.  NG tube in the stomach.  Monitor leads overlie the chest.  No definite free air on the decubitus view.  IMPRESSION: Negative for obstruction or free air.   Original Report Authenticated By: Judie Petit. Miles Costain, M.D.     Microbiology: Recent Results (from the past 240 hour(s))  STOOL CULTURE     Status: Normal   Collection Time   10/14/12  6:31 PM      Component Value Range Status Comment   Specimen Description STOOL   Final    Special Requests Normal   Final    Culture     Final    Value: ABUNDANT YEAST     NO SALMONELLA, SHIGELLA, CAMPYLOBACTER, YERSINIA, OR E.COLI 0157:H7 ISOLATED     Note: REDUCED NORMAL FLORA PRESENT   Report Status 10/18/2012 FINAL   Final   URINE CULTURE     Status: Normal   Collection Time   10/15/12  9:05 AM      Component Value Range Status Comment   Specimen Description URINE, CATHETERIZED   Final    Special Requests NONE   Final    Culture  Setup Time 10/15/2012 17:34   Final    Colony Count NO GROWTH   Final    Culture NO GROWTH   Final    Report Status 10/16/2012 FINAL   Final   CULTURE, BLOOD (ROUTINE X 2)     Status: Normal   Collection Time   10/15/12  9:55 AM      Component Value Range Status Comment   Specimen Description BLOOD LEFT HAND   Final    Special Requests BOTTLES DRAWN AEROBIC AND ANAEROBIC 10CC   Final    Culture  Setup Time 10/15/2012 16:06   Final    Culture NO GROWTH 5 DAYS   Final    Report Status 10/21/2012 FINAL   Final   CULTURE, BLOOD (ROUTINE X 2)     Status: Normal   Collection Time   10/15/12 10:05 AM      Component Value Range Status Comment   Specimen Description BLOOD RIGHT HAND   Final    Special Requests BOTTLES  DRAWN AEROBIC AND ANAEROBIC [facility]   Final    Culture  Setup Time 10/15/2012 16:06   Final    Culture NO GROWTH 5 DAYS   Final    Report Status 10/21/2012 FINAL   Final   CULTURE, RESPIRATORY     Status: Normal   Collection Time   10/15/12 11:25 AM      Component Value Range Status Comment   Specimen Description TRACHEAL ASPIRATE   Final    Special Requests NONE   Final    Gram Stain     Final    Value: RARE WBC PRESENT,BOTH PMN AND MONONUCLEAR     RARE SQUAMOUS EPITHELIAL CELLS PRESENT     NO ORGANISMS SEEN   Culture FEW CANDIDA ALBICANS   Final    Report Status 10/17/2012 FINAL   Final   CLOSTRIDIUM DIFFICILE BY PCR     Status: Normal  Collection Time   10/21/12  8:50 AM      Component Value Range Status Comment   C difficile by pcr NEGATIVE  NEGATIVE Final      Labs: Basic Metabolic Panel:  Lab 10/24/12 1610 10/22/12 0437 10/20/12 0400 10/19/12 0630 10/18/12 0500  NA 137 139 145 141 139  K 6.0* 3.8 3.1* 3.3* 4.1  CL 106 104 112 110 111  CO2 22 23 23 21 19   GLUCOSE 91 94 107* 94 71  BUN 7 6 7 11 13   CREATININE 0.75 0.71 0.67 0.89 1.05  CALCIUM 8.2* 7.5* 7.2* 7.6* 7.5*  MG 1.9 1.0* -- -- 1.3*   Liver Function Tests:  Lab 10/18/12 0500  AST --  ALT --  ALKPHOS --  BILITOT --  PROT --  ALBUMIN 1.2*   CBC:  Lab 10/24/12 0347 10/22/12 0437 10/20/12 0400 10/19/12 0630 10/18/12 0500  WBC 11.1* 15.6* 11.1* 8.7 7.8  HGB 9.4* 8.2* 8.1* 7.9* 8.0*  HCT 29.3* 25.4* 23.9* 23.9* 23.0*  MCV 93.0 91.4 89.2 87.9 82.7  PLT 580* 339 168 107* 71*   CBG:  Lab 10/24/12 0732 10/24/12 0050 10/23/12 1644 10/23/12 1238 10/23/12 0714  GLUCAP 71 87 117* 116* 88    Time coordinating discharge: Over 30 minutes  Signed:  Manson Passey, MD  [facility] 10/24/2012, 12:37 PM  Pager #: [phone]

## 2012-10-24 NOTE — Progress Notes (Signed)
Report called to [place] at [facility].  Will call results of potassium and update [facility] with the results.  Philomena Doheny RN

## 2012-10-24 NOTE — Progress Notes (Signed)
Follow up potassium 5.5. Patient can be discharged to SNF with changes made to discharge summary to recheck potassium and give kayexalate as needed it to correct potassium level further.  Manson Passey [facility] [phone]

## 2012-10-24 NOTE — Progress Notes (Signed)
Per psychiatry note, pt lacks capacity to make her own decisions right now. Clinical Social Worker contacted pt nephew, [name] via telephone to discuss. Discussed that due to pt lacking capacity, decision would be pt son and pt nephew about discharge plan, home vs SNF. Pt nephew plans to discuss with pt son and call this Clinical Social Worker back with decision. Clinical Social Worker to facilitate pt discharge needs today as pt is medically ready for discharge.  Jacklynn Lewis, MSW, LCSWA  Clinical Social Work [phone]

## 2012-10-24 NOTE — Progress Notes (Signed)
Clinical Social Worker spoke with pt son and pt nephew in hallway outside of pt room in regard to disposition plan. Pt nephew and pt son prefer SNF and interested in [facility] and [place]. Clinical Social Worker contacted facility and confirmed bed availability for today. Pt nephew and pt son plan to go to facility to tour and sign admission paperwork and notify this Clinical Social Worker with confirmation that this is complete. Clinical Social Worker to facilitate pt discharge needs this afternoon.  Jacklynn Lewis, MSW, LCSWA  Clinical Social Work [phone]

## 2012-10-24 NOTE — Progress Notes (Signed)
Clinical Social Worker received notification from pt family that pt family toured facility and agreeable to pt discharge to [facility]. Per MD, pt potassium has to be rechecked after patient given Kayexalate prior to discharge. Clinical Social Worker notified [place]. Clinical Social Worker to continue to follow and await word from RN about pt potassium.  Jacklynn Lewis, MSW, LCSWA  Clinical Social Work [phone]

## 2012-10-24 NOTE — Progress Notes (Signed)
Clinical Social Worker received notification from MD that pt potassium rechecked and pt stable for discharge to [facility]. Clinical Social Worker notified facility who confirmed that they could accept pt. Clinical Social Worker notified pt family. Clinical Social Worker arranged non-emergency ambulance transport. No further social work needs identified at this time. Clinical Social Worker signing off.   Jacklynn Lewis, MSW, LCSWA  Clinical Social Work [phone]

## 2012-10-28 DIAGNOSIS — J189 Pneumonia, unspecified organism: Secondary | ICD-10-CM | POA: Diagnosis not present

## 2012-10-28 DIAGNOSIS — J9600 Acute respiratory failure, unspecified whether with hypoxia or hypercapnia: Secondary | ICD-10-CM | POA: Diagnosis not present

## 2012-11-20 ENCOUNTER — Ambulatory Visit
Admission: RE | Admit: 2012-11-20 | Discharge: 2012-11-20 | Disposition: A | Discharge status: Home / Self Care | Payer: Medicare Other | Source: Ambulatory Visit | Attending: Nephrology | Admitting: Nephrology

## 2012-11-20 ENCOUNTER — Other Ambulatory Visit: Payer: Self-pay | Admitting: Nephrology

## 2012-11-20 DIAGNOSIS — J189 Pneumonia, unspecified organism: Secondary | ICD-10-CM | POA: Diagnosis not present

## 2012-11-20 DIAGNOSIS — Z09 Encounter for follow-up examination after completed treatment for conditions other than malignant neoplasm: Secondary | ICD-10-CM

## 2012-12-29 DIAGNOSIS — E119 Type 2 diabetes mellitus without complications: Secondary | ICD-10-CM | POA: Diagnosis not present

## 2012-12-29 DIAGNOSIS — G894 Chronic pain syndrome: Secondary | ICD-10-CM | POA: Diagnosis not present

## 2012-12-29 DIAGNOSIS — F411 Generalized anxiety disorder: Secondary | ICD-10-CM | POA: Diagnosis not present

## 2012-12-29 DIAGNOSIS — F17200 Nicotine dependence, unspecified, uncomplicated: Secondary | ICD-10-CM | POA: Diagnosis not present

## 2012-12-29 DIAGNOSIS — I10 Essential (primary) hypertension: Secondary | ICD-10-CM | POA: Diagnosis not present

## 2012-12-29 DIAGNOSIS — R51 Headache: Secondary | ICD-10-CM | POA: Diagnosis not present

## 2012-12-29 DIAGNOSIS — Z79899 Other long term (current) drug therapy: Secondary | ICD-10-CM | POA: Diagnosis not present

## 2012-12-29 DIAGNOSIS — E559 Vitamin D deficiency, unspecified: Secondary | ICD-10-CM | POA: Diagnosis not present

## 2012-12-29 DIAGNOSIS — T7840XA Allergy, unspecified, initial encounter: Secondary | ICD-10-CM | POA: Diagnosis not present

## 2012-12-29 DIAGNOSIS — R000 Tachycardia, unspecified: Secondary | ICD-10-CM | POA: Diagnosis not present

## 2013-01-13 DIAGNOSIS — G894 Chronic pain syndrome: Secondary | ICD-10-CM | POA: Diagnosis not present

## 2013-01-13 DIAGNOSIS — I10 Essential (primary) hypertension: Secondary | ICD-10-CM | POA: Diagnosis not present

## 2013-01-13 DIAGNOSIS — R51 Headache: Secondary | ICD-10-CM | POA: Diagnosis not present

## 2013-01-13 DIAGNOSIS — F411 Generalized anxiety disorder: Secondary | ICD-10-CM | POA: Diagnosis not present

## 2013-01-13 DIAGNOSIS — E119 Type 2 diabetes mellitus without complications: Secondary | ICD-10-CM | POA: Diagnosis not present

## 2013-01-13 DIAGNOSIS — E559 Vitamin D deficiency, unspecified: Secondary | ICD-10-CM | POA: Diagnosis not present

## 2013-01-13 DIAGNOSIS — F17200 Nicotine dependence, unspecified, uncomplicated: Secondary | ICD-10-CM | POA: Diagnosis not present

## 2013-01-13 DIAGNOSIS — T7840XA Allergy, unspecified, initial encounter: Secondary | ICD-10-CM | POA: Diagnosis not present

## 2013-01-28 DIAGNOSIS — M1710 Unilateral primary osteoarthritis, unspecified knee: Secondary | ICD-10-CM | POA: Diagnosis not present

## 2013-03-17 DIAGNOSIS — G894 Chronic pain syndrome: Secondary | ICD-10-CM | POA: Diagnosis not present

## 2013-03-17 DIAGNOSIS — E559 Vitamin D deficiency, unspecified: Secondary | ICD-10-CM | POA: Diagnosis not present

## 2013-03-17 DIAGNOSIS — I10 Essential (primary) hypertension: Secondary | ICD-10-CM | POA: Diagnosis not present

## 2013-03-17 DIAGNOSIS — R51 Headache: Secondary | ICD-10-CM | POA: Diagnosis not present

## 2013-03-17 DIAGNOSIS — T7840XA Allergy, unspecified, initial encounter: Secondary | ICD-10-CM | POA: Diagnosis not present

## 2013-03-17 DIAGNOSIS — R42 Dizziness and giddiness: Secondary | ICD-10-CM | POA: Diagnosis not present

## 2013-03-17 DIAGNOSIS — F17200 Nicotine dependence, unspecified, uncomplicated: Secondary | ICD-10-CM | POA: Diagnosis not present

## 2013-03-17 DIAGNOSIS — E119 Type 2 diabetes mellitus without complications: Secondary | ICD-10-CM | POA: Diagnosis not present

## 2013-03-17 DIAGNOSIS — F411 Generalized anxiety disorder: Secondary | ICD-10-CM | POA: Diagnosis not present

## 2013-03-17 DIAGNOSIS — K5090 Crohn's disease, unspecified, without complications: Secondary | ICD-10-CM | POA: Diagnosis not present

## 2013-04-01 DIAGNOSIS — M545 Low back pain: Secondary | ICD-10-CM | POA: Diagnosis not present

## 2013-06-25 DIAGNOSIS — R05 Cough: Secondary | ICD-10-CM | POA: Diagnosis not present

## 2013-06-25 DIAGNOSIS — E559 Vitamin D deficiency, unspecified: Secondary | ICD-10-CM | POA: Diagnosis not present

## 2013-06-25 DIAGNOSIS — K5090 Crohn's disease, unspecified, without complications: Secondary | ICD-10-CM | POA: Diagnosis not present

## 2013-06-25 DIAGNOSIS — R42 Dizziness and giddiness: Secondary | ICD-10-CM | POA: Diagnosis not present

## 2013-06-25 DIAGNOSIS — I10 Essential (primary) hypertension: Secondary | ICD-10-CM | POA: Diagnosis not present

## 2013-06-25 DIAGNOSIS — F17200 Nicotine dependence, unspecified, uncomplicated: Secondary | ICD-10-CM | POA: Diagnosis not present

## 2013-06-25 DIAGNOSIS — E119 Type 2 diabetes mellitus without complications: Secondary | ICD-10-CM | POA: Diagnosis not present

## 2013-06-25 DIAGNOSIS — J40 Bronchitis, not specified as acute or chronic: Secondary | ICD-10-CM | POA: Diagnosis not present

## 2013-07-02 DIAGNOSIS — M25519 Pain in unspecified shoulder: Secondary | ICD-10-CM | POA: Diagnosis not present

## 2013-07-02 DIAGNOSIS — M47812 Spondylosis without myelopathy or radiculopathy, cervical region: Secondary | ICD-10-CM | POA: Diagnosis not present

## 2013-07-09 DIAGNOSIS — J40 Bronchitis, not specified as acute or chronic: Secondary | ICD-10-CM | POA: Diagnosis not present

## 2013-07-09 DIAGNOSIS — R000 Tachycardia, unspecified: Secondary | ICD-10-CM | POA: Diagnosis not present

## 2013-07-09 DIAGNOSIS — F411 Generalized anxiety disorder: Secondary | ICD-10-CM | POA: Diagnosis not present

## 2013-07-09 DIAGNOSIS — G894 Chronic pain syndrome: Secondary | ICD-10-CM | POA: Diagnosis not present

## 2013-07-09 DIAGNOSIS — B029 Zoster without complications: Secondary | ICD-10-CM | POA: Diagnosis not present

## 2013-07-09 DIAGNOSIS — E875 Hyperkalemia: Secondary | ICD-10-CM | POA: Diagnosis not present

## 2013-07-09 DIAGNOSIS — I10 Essential (primary) hypertension: Secondary | ICD-10-CM | POA: Diagnosis not present

## 2013-07-09 DIAGNOSIS — E1142 Type 2 diabetes mellitus with diabetic polyneuropathy: Secondary | ICD-10-CM | POA: Diagnosis not present

## 2013-07-09 DIAGNOSIS — E119 Type 2 diabetes mellitus without complications: Secondary | ICD-10-CM | POA: Diagnosis not present

## 2013-08-17 DIAGNOSIS — M545 Low back pain: Secondary | ICD-10-CM | POA: Diagnosis not present

## 2013-08-24 DIAGNOSIS — J40 Bronchitis, not specified as acute or chronic: Secondary | ICD-10-CM | POA: Diagnosis not present

## 2013-08-24 DIAGNOSIS — B029 Zoster without complications: Secondary | ICD-10-CM | POA: Diagnosis not present

## 2013-08-24 DIAGNOSIS — F411 Generalized anxiety disorder: Secondary | ICD-10-CM | POA: Diagnosis not present

## 2013-08-24 DIAGNOSIS — R634 Abnormal weight loss: Secondary | ICD-10-CM | POA: Diagnosis not present

## 2013-08-24 DIAGNOSIS — I10 Essential (primary) hypertension: Secondary | ICD-10-CM | POA: Diagnosis not present

## 2013-08-24 DIAGNOSIS — G894 Chronic pain syndrome: Secondary | ICD-10-CM | POA: Diagnosis not present

## 2013-08-24 DIAGNOSIS — E119 Type 2 diabetes mellitus without complications: Secondary | ICD-10-CM | POA: Diagnosis not present

## 2013-08-24 DIAGNOSIS — R000 Tachycardia, unspecified: Secondary | ICD-10-CM | POA: Diagnosis not present

## 2013-09-21 DIAGNOSIS — R634 Abnormal weight loss: Secondary | ICD-10-CM | POA: Diagnosis not present

## 2013-09-21 DIAGNOSIS — J40 Bronchitis, not specified as acute or chronic: Secondary | ICD-10-CM | POA: Diagnosis not present

## 2013-09-21 DIAGNOSIS — F411 Generalized anxiety disorder: Secondary | ICD-10-CM | POA: Diagnosis not present

## 2013-09-21 DIAGNOSIS — E559 Vitamin D deficiency, unspecified: Secondary | ICD-10-CM | POA: Diagnosis not present

## 2013-09-21 DIAGNOSIS — F17200 Nicotine dependence, unspecified, uncomplicated: Secondary | ICD-10-CM | POA: Diagnosis not present

## 2013-09-21 DIAGNOSIS — I10 Essential (primary) hypertension: Secondary | ICD-10-CM | POA: Diagnosis not present

## 2013-09-21 DIAGNOSIS — R000 Tachycardia, unspecified: Secondary | ICD-10-CM | POA: Diagnosis not present

## 2013-09-21 DIAGNOSIS — E1142 Type 2 diabetes mellitus with diabetic polyneuropathy: Secondary | ICD-10-CM | POA: Diagnosis not present

## 2013-09-21 DIAGNOSIS — B029 Zoster without complications: Secondary | ICD-10-CM | POA: Diagnosis not present

## 2013-09-21 DIAGNOSIS — E119 Type 2 diabetes mellitus without complications: Secondary | ICD-10-CM | POA: Diagnosis not present

## 2013-09-21 DIAGNOSIS — K5090 Crohn's disease, unspecified, without complications: Secondary | ICD-10-CM | POA: Diagnosis not present

## 2013-09-21 DIAGNOSIS — M545 Low back pain: Secondary | ICD-10-CM | POA: Diagnosis not present

## 2013-09-21 DIAGNOSIS — G894 Chronic pain syndrome: Secondary | ICD-10-CM | POA: Diagnosis not present

## 2013-10-05 ENCOUNTER — Emergency Department (HOSPITAL_COMMUNITY)
Admission: EM | Admit: 2013-10-05 | Discharge: 2013-10-05 | Disposition: A | Discharge status: Home / Self Care | Payer: Medicare Other | Attending: Emergency Medicine | Admitting: Emergency Medicine

## 2013-10-05 ENCOUNTER — Encounter (HOSPITAL_COMMUNITY): Payer: Self-pay | Admitting: Emergency Medicine

## 2013-10-05 ENCOUNTER — Emergency Department (HOSPITAL_COMMUNITY): Payer: Medicare Other

## 2013-10-05 DIAGNOSIS — R110 Nausea: Secondary | ICD-10-CM

## 2013-10-05 DIAGNOSIS — Z87891 Personal history of nicotine dependence: Secondary | ICD-10-CM | POA: Insufficient documentation

## 2013-10-05 DIAGNOSIS — R197 Diarrhea, unspecified: Secondary | ICD-10-CM | POA: Diagnosis not present

## 2013-10-05 DIAGNOSIS — E119 Type 2 diabetes mellitus without complications: Secondary | ICD-10-CM | POA: Diagnosis not present

## 2013-10-05 DIAGNOSIS — R112 Nausea with vomiting, unspecified: Secondary | ICD-10-CM | POA: Insufficient documentation

## 2013-10-05 DIAGNOSIS — H409 Unspecified glaucoma: Secondary | ICD-10-CM | POA: Diagnosis not present

## 2013-10-05 DIAGNOSIS — Z79899 Other long term (current) drug therapy: Secondary | ICD-10-CM | POA: Insufficient documentation

## 2013-10-05 DIAGNOSIS — Z8719 Personal history of other diseases of the digestive system: Secondary | ICD-10-CM | POA: Insufficient documentation

## 2013-10-05 DIAGNOSIS — Z880 Allergy status to penicillin: Secondary | ICD-10-CM | POA: Insufficient documentation

## 2013-10-05 DIAGNOSIS — K824 Cholesterolosis of gallbladder: Secondary | ICD-10-CM | POA: Diagnosis not present

## 2013-10-05 DIAGNOSIS — I10 Essential (primary) hypertension: Secondary | ICD-10-CM | POA: Insufficient documentation

## 2013-10-05 DIAGNOSIS — Z8619 Personal history of other infectious and parasitic diseases: Secondary | ICD-10-CM | POA: Diagnosis not present

## 2013-10-05 DIAGNOSIS — R1013 Epigastric pain: Secondary | ICD-10-CM | POA: Insufficient documentation

## 2013-10-05 DIAGNOSIS — R1110 Vomiting, unspecified: Secondary | ICD-10-CM

## 2013-10-05 DIAGNOSIS — G589 Mononeuropathy, unspecified: Secondary | ICD-10-CM | POA: Diagnosis not present

## 2013-10-05 LAB — CBC WITH DIFFERENTIAL/PLATELET
Basophils Absolute: 0.1 10*3/uL (ref 0.0–0.1)
Basophils Relative: 0 % (ref 0–1)
EOS ABS: 0.1 10*3/uL (ref 0.0–0.7)
EOS PCT: 1 % (ref 0–5)
HEMATOCRIT: 47.9 % — AB (ref 36.0–46.0)
HEMOGLOBIN: 16.1 g/dL — AB (ref 12.0–15.0)
LYMPHS ABS: 3 10*3/uL (ref 0.7–4.0)
LYMPHS PCT: 23 % (ref 12–46)
MCH: 30.6 pg (ref 26.0–34.0)
MCHC: 33.6 g/dL (ref 30.0–36.0)
MCV: 91.1 fL (ref 78.0–100.0)
MONO ABS: 0.8 10*3/uL (ref 0.1–1.0)
MONOS PCT: 6 % (ref 3–12)
Neutro Abs: 8.7 10*3/uL — ABNORMAL HIGH (ref 1.7–7.7)
Neutrophils Relative %: 69 % (ref 43–77)
PLATELETS: 349 10*3/uL (ref 150–400)
RBC: 5.26 MIL/uL — AB (ref 3.87–5.11)
RDW: 16.5 % — ABNORMAL HIGH (ref 11.5–15.5)
WBC: 12.7 10*3/uL — AB (ref 4.0–10.5)

## 2013-10-05 LAB — URINALYSIS, ROUTINE W REFLEX MICROSCOPIC
Glucose, UA: NEGATIVE mg/dL
HGB URINE DIPSTICK: NEGATIVE
Ketones, ur: 15 mg/dL — AB
Leukocytes, UA: NEGATIVE
NITRITE: NEGATIVE
PROTEIN: 100 mg/dL — AB
Specific Gravity, Urine: 1.029 (ref 1.005–1.030)
UROBILINOGEN UA: 0.2 mg/dL (ref 0.0–1.0)
pH: 5.5 (ref 5.0–8.0)

## 2013-10-05 LAB — COMPREHENSIVE METABOLIC PANEL
ALT: 7 U/L (ref 0–35)
AST: 12 U/L (ref 0–37)
Albumin: 4 g/dL (ref 3.5–5.2)
Alkaline Phosphatase: 97 U/L (ref 39–117)
BUN: 13 mg/dL (ref 6–23)
CALCIUM: 10.1 mg/dL (ref 8.4–10.5)
CO2: 14 meq/L — AB (ref 19–32)
Chloride: 101 mEq/L (ref 96–112)
Creatinine, Ser: 0.92 mg/dL (ref 0.50–1.10)
GFR, EST AFRICAN AMERICAN: 80 mL/min — AB (ref >90)
GFR, EST NON AFRICAN AMERICAN: 69 mL/min — AB (ref >90)
GLUCOSE: 105 mg/dL — AB (ref 70–99)
Potassium: 4 mEq/L (ref 3.7–5.3)
SODIUM: 138 meq/L (ref 137–147)
TOTAL PROTEIN: 8.5 g/dL — AB (ref 6.0–8.3)
Total Bilirubin: <0.2 mg/dL — ABNORMAL LOW (ref 0.3–1.2)

## 2013-10-05 LAB — URINE MICROSCOPIC-ADD ON

## 2013-10-05 LAB — LIPASE, BLOOD: LIPASE: 27 U/L (ref 11–59)

## 2013-10-05 LAB — CG4 I-STAT (LACTIC ACID): LACTIC ACID, VENOUS: 0.91 mmol/L (ref 0.5–2.2)

## 2013-10-05 LAB — LACTIC ACID, PLASMA: LACTIC ACID, VENOUS: 0.9 mmol/L (ref 0.5–2.2)

## 2013-10-05 MED ORDER — HYDROMORPHONE HCL PF 1 MG/ML IJ SOLN: 1.0000 mg | Freq: Once | INTRAMUSCULAR | Status: DC

## 2013-10-05 MED ORDER — HYDROMORPHONE HCL PF 1 MG/ML IJ SOLN
1.0000 mg | Freq: Once | INTRAMUSCULAR | Status: AC
Start: 2013-10-05 — End: 2013-10-05
  Administered 2013-10-05: 1 mg via INTRAVENOUS
  Filled 2013-10-05: qty 1

## 2013-10-05 MED ORDER — ONDANSETRON HCL 4 MG/2ML IJ SOLN
4.0000 mg | Freq: Once | INTRAMUSCULAR | Status: AC
  Administered 2013-10-05: 4 mg via INTRAVENOUS
  Filled 2013-10-05: qty 2

## 2013-10-05 MED ORDER — SODIUM CHLORIDE 0.9 % IV BOLUS (SEPSIS)
1000.0000 mL | Freq: Once | INTRAVENOUS | Status: AC
  Administered 2013-10-05: 1000 mL via INTRAVENOUS

## 2013-10-05 MED ORDER — ONDANSETRON HCL 4 MG PO TABS: 4.0000 mg | ORAL_TABLET | Freq: Four times a day (QID) | ORAL | Status: DC

## 2013-10-05 MED ORDER — MORPHINE SULFATE 4 MG/ML IJ SOLN
4.0000 mg | Freq: Once | INTRAMUSCULAR | Status: AC
Start: 2013-10-05 — End: 2013-10-05
  Administered 2013-10-05: 4 mg via INTRAVENOUS
  Filled 2013-10-05: qty 1

## 2013-10-05 NOTE — ED Provider Notes (Signed)
CSN: [id number]     Arrival date & time 10/05/13  1037 History   First MD Initiated Contact with Patient 10/05/13 1049     Chief Complaint  Patient presents with  . Emesis  . Nausea  . Abdominal Pain   (Consider location/radiation/quality/duration/timing/severity/associated sxs/prior Treatment) HPI Comments: Patient presents emergency department with chief complaint of nausea and vomiting and abdominal pain x4 days. She states that she has been unable to be anything. She denies any fevers, or chills. Denies any bloody or bilious vomit. She denies any associated diarrhea or constipation. Also denies any hematuria or dysuria. She denies any sick contacts. She reports that she has epigastric abdominal pain. She states the pain is approximately a 9/10. Are no aggravating or alleviating factors. She states that she does not drink alcohol. Other health problems or diabetes, hypertension, hep C, and pancreatitis.  The history is provided by the patient. No language interpreter was used.    Past Medical History  Diagnosis Date  . Glaucoma   . Diabetes mellitus without complication   . Hypertension   . Hepatitis C   . Pancreatitis   . Neuropathy    History reviewed. No pertinent past surgical history. History reviewed. No pertinent family history. History  Substance Use Topics  . Smoking status: Former Games developermoker  . Smokeless tobacco: Not on file  . Alcohol Use: Yes   OB History   Grav Para Term Preterm Abortions TAB SAB Ect Mult Living                 Review of Systems  All other systems reviewed and are negative.    Allergies  Iohexol and Penicillins  Home Medications   Current Outpatient Rx  Name  Route  Sig  Dispense  Refill  . alprazolam (XANAX) 2 MG tablet   Oral   Take 2 mg by mouth 3 (three) times daily as needed for anxiety.         Marland Kitchen. amitriptyline (ELAVIL) 50 MG tablet   Oral   Take 1 tablet (50 mg total) by mouth daily.   30 tablet   0   . brinzolamide  (AZOPT) 1 % ophthalmic suspension   Both Eyes   Place 1 drop into both eyes 3 (three) times daily.          . carvedilol (COREG) 12.5 MG tablet   Oral   Take 12.5 mg by mouth 2 (two) times daily with a meal.         . cetirizine (ZYRTEC) 10 MG tablet   Oral   Take 10 mg by mouth every morning.         . cycloSPORINE (RESTASIS) 0.05 % ophthalmic emulsion   Both Eyes   Place 1 drop into both eyes 2 (two) times daily.          . folic acid (FOLVITE) 1 MG tablet   Oral   Take 1 tablet (1 mg total) by mouth daily.   30 tablet   0   . gabapentin (NEURONTIN) 300 MG capsule   Oral   Take 1,200-1,500 mg by mouth See admin instructions. 5 capsules (1500mg ) every morning and night, 4 capsules (1200mg ) every mid-day         . glimepiride (AMARYL) 2 MG tablet   Oral   Take 2 mg by mouth daily as needed (diet).          Marland Kitchen. HYDROcodone-acetaminophen (NORCO) 10-325 MG per tablet   Oral   Take  1 tablet by mouth every 6 (six) hours as needed for moderate pain.         Marland Kitchen HYDROmorphone (DILAUDID) 8 MG tablet   Oral   Take 8 mg by mouth 2 (two) times daily as needed for severe pain.         Marland Kitchen lisinopril (PRINIVIL,ZESTRIL) 5 MG tablet   Oral   Take 5 mg by mouth at bedtime.          . meclizine (ANTIVERT) 25 MG tablet   Oral   Take 25 mg by mouth 3 (three) times daily as needed.           . methocarbamol (ROBAXIN) 750 MG tablet   Oral   Take 1 tablet (750 mg total) by mouth 2 (two) times daily as needed. For muscle spasms   30 tablet   0   . OVER THE COUNTER MEDICATION   Oral   Take 1 tablet by mouth 3 (three) times daily. Sinus med         . pantoprazole (PROTONIX) 20 MG tablet   Oral   Take 40 mg by mouth 2 (two) times daily.         . polyethylene glycol (MIRALAX / GLYCOLAX) packet   Oral   Take 17 g by mouth daily.         . promethazine (PHENERGAN) 25 MG tablet   Oral   Take 25 mg by mouth 2 (two) times daily as needed. For nausea          . simvastatin (ZOCOR) 40 MG tablet   Oral   Take 40 mg by mouth daily.           Marland Kitchen sulfaSALAzine (AZULFIDINE) 500 MG tablet   Oral   Take 1,000 mg by mouth 2 (two) times daily.           Marland Kitchen thiamine 100 MG tablet   Oral   Take 1 tablet (100 mg total) by mouth daily.   30 tablet   0   . tiotropium (SPIRIVA) 18 MCG inhalation capsule   Inhalation   Place 18 mcg into inhaler and inhale daily.         . traZODone (DESYREL) 50 MG tablet   Oral   Take 50 mg by mouth daily.            BP 108/70  Pulse 119  Temp(Src) 98.6 F (37 C) (Oral)  Resp 20  Ht 5' 3.5" (1.613 m)  Wt 92 lb (41.731 kg)  BMI 16.04 kg/m2  SpO2 97% Physical Exam  Nursing note and vitals reviewed. Constitutional: She is oriented to person, place, and time. She appears well-developed and well-nourished.  HENT:  Head: Normocephalic and atraumatic.  Eyes: Conjunctivae and EOM are normal. Pupils are equal, round, and reactive to light.  Neck: Normal range of motion. Neck supple.  Cardiovascular: Normal rate and regular rhythm.  Exam reveals no gallop and no friction rub.   No murmur heard. Pulmonary/Chest: Effort normal and breath sounds normal. No respiratory distress. She has no wheezes. She has no rales. She exhibits no tenderness.  Abdominal: Soft. Bowel sounds are normal. She exhibits no distension and no mass. There is tenderness. There is no rebound and no guarding.  Abdomen is very tender in the epigastrium, but no Murphy sign, no pain at McBurney's point, no evidence of acute abdomen  Musculoskeletal: Normal range of motion. She exhibits no edema and no tenderness.  Neurological: She is alert  and oriented to person, place, and time.  Skin: Skin is warm and dry.  Psychiatric: She has a normal mood and affect. Her behavior is normal. Judgment and thought content normal.    ED Course  Procedures (including critical care time) Results for orders placed during the hospital encounter of 10/05/13   CBC WITH DIFFERENTIAL      Result Value Range   WBC 12.7 (*) 4.0 - 10.5 K/uL   RBC 5.26 (*) 3.87 - 5.11 MIL/uL   Hemoglobin 16.1 (*) 12.0 - 15.0 g/dL   HCT 65.7 (*) 84.6 - 96.2 %   MCV 91.1  78.0 - 100.0 fL   MCH 30.6  26.0 - 34.0 pg   MCHC 33.6  30.0 - 36.0 g/dL   RDW 95.2 (*) 84.1 - 32.4 %   Platelets 349  150 - 400 K/uL   Neutrophils Relative % 69  43 - 77 %   Neutro Abs 8.7 (*) 1.7 - 7.7 K/uL   Lymphocytes Relative 23  12 - 46 %   Lymphs Abs 3.0  0.7 - 4.0 K/uL   Monocytes Relative 6  3 - 12 %   Monocytes Absolute 0.8  0.1 - 1.0 K/uL   Eosinophils Relative 1  0 - 5 %   Eosinophils Absolute 0.1  0.0 - 0.7 K/uL   Basophils Relative 0  0 - 1 %   Basophils Absolute 0.1  0.0 - 0.1 K/uL  COMPREHENSIVE METABOLIC PANEL      Result Value Range   Sodium 138  137 - 147 mEq/L   Potassium 4.0  3.7 - 5.3 mEq/L   Chloride 101  96 - 112 mEq/L   CO2 14 (*) 19 - 32 mEq/L   Glucose, Bld 105 (*) 70 - 99 mg/dL   BUN 13  6 - 23 mg/dL   Creatinine, Ser 4.01  0.50 - 1.10 mg/dL   Calcium 02.7  8.4 - 25.3 mg/dL   Total Protein 8.5 (*) 6.0 - 8.3 g/dL   Albumin 4.0  3.5 - 5.2 g/dL   AST 12  0 - 37 U/L   ALT 7  0 - 35 U/L   Alkaline Phosphatase 97  39 - 117 U/L   Total Bilirubin <0.2 (*) 0.3 - 1.2 mg/dL   GFR calc non Af Amer 69 (*) >90 mL/min   GFR calc Af Amer 80 (*) >90 mL/min  LIPASE, BLOOD      Result Value Range   Lipase 27  11 - 59 U/L  URINALYSIS, ROUTINE W REFLEX MICROSCOPIC      Result Value Range   Color, Urine YELLOW  YELLOW   APPearance CLOUDY (*) CLEAR   Specific Gravity, Urine 1.029  1.005 - 1.030   pH 5.5  5.0 - 8.0   Glucose, UA NEGATIVE  NEGATIVE mg/dL   Hgb urine dipstick NEGATIVE  NEGATIVE   Bilirubin Urine MODERATE (*) NEGATIVE   Ketones, ur 15 (*) NEGATIVE mg/dL   Protein, ur 664 (*) NEGATIVE mg/dL   Urobilinogen, UA 0.2  0.0 - 1.0 mg/dL   Nitrite NEGATIVE  NEGATIVE   Leukocytes, UA NEGATIVE  NEGATIVE  URINE MICROSCOPIC-ADD ON      Result Value Range    Squamous Epithelial / LPF FEW (*) RARE   WBC, UA 0-2  <3 WBC/hpf   Bacteria, UA RARE  RARE   Casts HYALINE CASTS (*) NEGATIVE   Urine-Other RARE YEAST     US Abdomen Complete  10/05/2013   CLINICAL  DATA:  Abdomen pain  EXAM: ULTRASOUND ABDOMEN COMPLETE  COMPARISON:  07/31/2011  FINDINGS: Gallbladder:  No Murphy's sign or stone.  4 mm polyp identified.  Common bile duct:  Diameter: 9 mmas compared to 11mm previously  Liver:  No focal lesion identified. Within normal limits in parenchymal echogenicity.  IVC:  No abnormality visualized.  Pancreas:  Grossly normal but not seen well  Spleen:  Size and appearance within normal limits.  Right Kidney:  Length: 8.4 cm. No evidence of cyst or mass. Moderately increased echogenicity.  Left Kidney:  Length: 8.4 cm. Similar appearance to the right kidney with increased echogenicity. Echogenicity within normal limits. No mass or hydronephrosis visualized.  Abdominal aorta:  No aneurysm visualized.  Other findings:  None.  IMPRESSION: 1. 4 mm gallbladder polyp, which was not detected on the prior study. This is statistically likely benign. 2. Chronic common bile duct dilitation 3. Medical renal disease 1.   Electronically Signed   By: Esperanza Heir M.D.   On: 10/05/2013 13:35      EKG Interpretation   None       MDM   1. Nausea   2. Vomiting      Patient with abdominal pain with n/v for 4 days.  Anion gap is 23.  Glucose is 105.  Doubt DKA, will check lactate.  US abdomen showed a polyp on gallbladder.  Discussed the patient with Dr. Effie Shy.  Dr. Effie Shy tells me that the gap is likely dehydration related.  He recommends repeat blood work in 1 week.  Lactate is normal.   Patient states that she is feeling better.  She is tolerating PO.  DC to home with PCP follow-up.  Roxy Horseman, PA-C 10/05/13 1623

## 2013-10-05 NOTE — ED Notes (Addendum)
Pt to department via EMS from home- pt reports she has had n/v and abd pain for the past 4 days. Reports that she has been unable to eat anything. Denies any fever, or being around any others that have been sick. Bp-114/84 Hr-100 Cbg-115

## 2013-10-05 NOTE — ED Notes (Signed)
Ambulated pt in the hallway with no problems.

## 2013-10-05 NOTE — Discharge Instructions (Signed)
Please followup with your regular doctor, and have your blood work rechecked in one week. Nausea and Vomiting Nausea is a sick feeling that often comes before throwing up (vomiting). Vomiting is a reflex where stomach contents come out of your mouth. Vomiting can cause severe loss of body fluids (dehydration). Children and elderly adults can become dehydrated quickly, especially if they also have diarrhea. Nausea and vomiting are symptoms of a condition or disease. It is important to find the cause of your symptoms. CAUSES   Direct irritation of the stomach lining. This irritation can result from increased acid production (gastroesophageal reflux disease), infection, food poisoning, taking certain medicines (such as nonsteroidal anti-inflammatory drugs), alcohol use, or tobacco use.  Signals from the brain.These signals could be caused by a headache, heat exposure, an inner ear disturbance, increased pressure in the brain from injury, infection, a tumor, or a concussion, pain, emotional stimulus, or metabolic problems.  An obstruction in the gastrointestinal tract (bowel obstruction).  Illnesses such as diabetes, hepatitis, gallbladder problems, appendicitis, kidney problems, cancer, sepsis, atypical symptoms of a heart attack, or eating disorders.  Medical treatments such as chemotherapy and radiation.  Receiving medicine that makes you sleep (general anesthetic) during surgery. DIAGNOSIS Your caregiver may ask for tests to be done if the problems do not improve after a few days. Tests may also be done if symptoms are severe or if the reason for the nausea and vomiting is not clear. Tests may include:  Urine tests.  Blood tests.  Stool tests.  Cultures (to look for evidence of infection).  X-rays or other imaging studies. Test results can help your caregiver make decisions about treatment or the need for additional tests. TREATMENT You need to stay well hydrated. Drink frequently but  in small amounts.You may wish to drink water, sports drinks, clear broth, or eat frozen ice pops or gelatin dessert to help stay hydrated.When you eat, eating slowly may help prevent nausea.There are also some antinausea medicines that may help prevent nausea. HOME CARE INSTRUCTIONS   Take all medicine as directed by your caregiver.  If you do not have an appetite, do not force yourself to eat. However, you must continue to drink fluids.  If you have an appetite, eat a normal diet unless your caregiver tells you differently.  Eat a variety of complex carbohydrates (rice, wheat, potatoes, bread), lean meats, yogurt, fruits, and vegetables.  Avoid high-fat foods because they are more difficult to digest.  Drink enough water and fluids to keep your urine clear or pale yellow.  If you are dehydrated, ask your caregiver for specific rehydration instructions. Signs of dehydration may include:  Severe thirst.  Dry lips and mouth.  Dizziness.  Dark urine.  Decreasing urine frequency and amount.  Confusion.  Rapid breathing or pulse. SEEK IMMEDIATE MEDICAL CARE IF:   You have blood or brown flecks (like coffee grounds) in your vomit.  You have black or bloody stools.  You have a severe headache or stiff neck.  You are confused.  You have severe abdominal pain.  You have chest pain or trouble breathing.  You do not urinate at least once every 8 hours.  You develop cold or clammy skin.  You continue to vomit for longer than 24 to 48 hours.  You have a fever. MAKE SURE YOU:   Understand these instructions.  Will watch your condition.  Will get help right away if you are not doing well or get worse. Document Released: 09/17/2005 Document Revised:  12/10/2011 Document Reviewed: 02/14/2011 Exit[employer] Patient Information 2014 [place].

## 2013-10-05 NOTE — ED Provider Notes (Signed)
Medical screening examination/treatment/procedure(s) were performed by non-physician practitioner and as supervising physician I was immediately available for consultation/collaboration.  Flint MelterElliott L Tyrel Lex, MD 10/05/13 [phone]

## 2013-10-05 NOTE — ED Notes (Addendum)
Pt tolerated soda and ice well at this time. Asking for another soda. Updated on plan of care. Remains on cardiac monitor. Skin warm and dry.

## 2013-10-29 DIAGNOSIS — E1142 Type 2 diabetes mellitus with diabetic polyneuropathy: Secondary | ICD-10-CM | POA: Diagnosis not present

## 2013-10-29 DIAGNOSIS — F17200 Nicotine dependence, unspecified, uncomplicated: Secondary | ICD-10-CM | POA: Diagnosis not present

## 2013-10-29 DIAGNOSIS — R634 Abnormal weight loss: Secondary | ICD-10-CM | POA: Diagnosis not present

## 2013-10-29 DIAGNOSIS — G894 Chronic pain syndrome: Secondary | ICD-10-CM | POA: Diagnosis not present

## 2013-10-29 DIAGNOSIS — F411 Generalized anxiety disorder: Secondary | ICD-10-CM | POA: Diagnosis not present

## 2013-10-29 DIAGNOSIS — E119 Type 2 diabetes mellitus without complications: Secondary | ICD-10-CM | POA: Diagnosis not present

## 2013-10-29 DIAGNOSIS — K5090 Crohn's disease, unspecified, without complications: Secondary | ICD-10-CM | POA: Diagnosis not present

## 2013-10-29 DIAGNOSIS — I10 Essential (primary) hypertension: Secondary | ICD-10-CM | POA: Diagnosis not present

## 2013-11-19 DIAGNOSIS — M545 Low back pain, unspecified: Secondary | ICD-10-CM | POA: Diagnosis not present

## 2013-12-29 ENCOUNTER — Other Ambulatory Visit: Payer: Self-pay

## 2013-12-29 DIAGNOSIS — Z1231 Encounter for screening mammogram for malignant neoplasm of breast: Secondary | ICD-10-CM

## 2014-01-12 ENCOUNTER — Ambulatory Visit: Payer: Medicare Other

## 2014-02-23 DIAGNOSIS — G894 Chronic pain syndrome: Secondary | ICD-10-CM | POA: Diagnosis not present

## 2014-02-23 DIAGNOSIS — E1142 Type 2 diabetes mellitus with diabetic polyneuropathy: Secondary | ICD-10-CM | POA: Diagnosis not present

## 2014-02-23 DIAGNOSIS — I10 Essential (primary) hypertension: Secondary | ICD-10-CM | POA: Diagnosis not present

## 2014-02-23 DIAGNOSIS — E559 Vitamin D deficiency, unspecified: Secondary | ICD-10-CM | POA: Diagnosis not present

## 2014-02-23 DIAGNOSIS — F341 Dysthymic disorder: Secondary | ICD-10-CM | POA: Diagnosis not present

## 2014-02-23 DIAGNOSIS — F17200 Nicotine dependence, unspecified, uncomplicated: Secondary | ICD-10-CM | POA: Diagnosis not present

## 2014-02-23 DIAGNOSIS — K5090 Crohn's disease, unspecified, without complications: Secondary | ICD-10-CM | POA: Diagnosis not present

## 2014-02-23 DIAGNOSIS — E119 Type 2 diabetes mellitus without complications: Secondary | ICD-10-CM | POA: Diagnosis not present

## 2014-02-24 DIAGNOSIS — S7000XA Contusion of unspecified hip, initial encounter: Secondary | ICD-10-CM | POA: Diagnosis not present

## 2014-03-01 DIAGNOSIS — R5381 Other malaise: Secondary | ICD-10-CM | POA: Diagnosis not present

## 2014-03-01 DIAGNOSIS — F341 Dysthymic disorder: Secondary | ICD-10-CM | POA: Diagnosis not present

## 2014-03-01 DIAGNOSIS — IMO0001 Reserved for inherently not codable concepts without codable children: Secondary | ICD-10-CM | POA: Diagnosis not present

## 2014-03-01 DIAGNOSIS — I10 Essential (primary) hypertension: Secondary | ICD-10-CM | POA: Diagnosis not present

## 2014-03-01 DIAGNOSIS — W19XXXA Unspecified fall, initial encounter: Secondary | ICD-10-CM | POA: Diagnosis not present

## 2014-03-01 DIAGNOSIS — E119 Type 2 diabetes mellitus without complications: Secondary | ICD-10-CM | POA: Diagnosis not present

## 2014-03-02 DIAGNOSIS — R5381 Other malaise: Secondary | ICD-10-CM | POA: Diagnosis not present

## 2014-03-02 DIAGNOSIS — I10 Essential (primary) hypertension: Secondary | ICD-10-CM | POA: Diagnosis not present

## 2014-03-02 DIAGNOSIS — F341 Dysthymic disorder: Secondary | ICD-10-CM | POA: Diagnosis not present

## 2014-03-02 DIAGNOSIS — W19XXXA Unspecified fall, initial encounter: Secondary | ICD-10-CM | POA: Diagnosis not present

## 2014-03-02 DIAGNOSIS — IMO0001 Reserved for inherently not codable concepts without codable children: Secondary | ICD-10-CM | POA: Diagnosis not present

## 2014-03-02 DIAGNOSIS — E119 Type 2 diabetes mellitus without complications: Secondary | ICD-10-CM | POA: Diagnosis not present

## 2014-03-04 DIAGNOSIS — I10 Essential (primary) hypertension: Secondary | ICD-10-CM | POA: Diagnosis not present

## 2014-03-04 DIAGNOSIS — W19XXXA Unspecified fall, initial encounter: Secondary | ICD-10-CM | POA: Diagnosis not present

## 2014-03-04 DIAGNOSIS — IMO0001 Reserved for inherently not codable concepts without codable children: Secondary | ICD-10-CM | POA: Diagnosis not present

## 2014-03-04 DIAGNOSIS — F341 Dysthymic disorder: Secondary | ICD-10-CM | POA: Diagnosis not present

## 2014-03-04 DIAGNOSIS — R5381 Other malaise: Secondary | ICD-10-CM | POA: Diagnosis not present

## 2014-03-04 DIAGNOSIS — E119 Type 2 diabetes mellitus without complications: Secondary | ICD-10-CM | POA: Diagnosis not present

## 2014-03-08 DIAGNOSIS — IMO0001 Reserved for inherently not codable concepts without codable children: Secondary | ICD-10-CM | POA: Diagnosis not present

## 2014-03-08 DIAGNOSIS — E119 Type 2 diabetes mellitus without complications: Secondary | ICD-10-CM | POA: Diagnosis not present

## 2014-03-08 DIAGNOSIS — R5381 Other malaise: Secondary | ICD-10-CM | POA: Diagnosis not present

## 2014-03-08 DIAGNOSIS — W19XXXA Unspecified fall, initial encounter: Secondary | ICD-10-CM | POA: Diagnosis not present

## 2014-03-08 DIAGNOSIS — F341 Dysthymic disorder: Secondary | ICD-10-CM | POA: Diagnosis not present

## 2014-03-08 DIAGNOSIS — I10 Essential (primary) hypertension: Secondary | ICD-10-CM | POA: Diagnosis not present

## 2014-03-09 DIAGNOSIS — E119 Type 2 diabetes mellitus without complications: Secondary | ICD-10-CM | POA: Diagnosis not present

## 2014-03-09 DIAGNOSIS — W19XXXA Unspecified fall, initial encounter: Secondary | ICD-10-CM | POA: Diagnosis not present

## 2014-03-09 DIAGNOSIS — IMO0001 Reserved for inherently not codable concepts without codable children: Secondary | ICD-10-CM | POA: Diagnosis not present

## 2014-03-09 DIAGNOSIS — R5381 Other malaise: Secondary | ICD-10-CM | POA: Diagnosis not present

## 2014-03-09 DIAGNOSIS — F341 Dysthymic disorder: Secondary | ICD-10-CM | POA: Diagnosis not present

## 2014-03-09 DIAGNOSIS — I10 Essential (primary) hypertension: Secondary | ICD-10-CM | POA: Diagnosis not present

## 2014-03-10 DIAGNOSIS — IMO0001 Reserved for inherently not codable concepts without codable children: Secondary | ICD-10-CM | POA: Diagnosis not present

## 2014-03-10 DIAGNOSIS — E119 Type 2 diabetes mellitus without complications: Secondary | ICD-10-CM | POA: Diagnosis not present

## 2014-03-10 DIAGNOSIS — F341 Dysthymic disorder: Secondary | ICD-10-CM | POA: Diagnosis not present

## 2014-03-10 DIAGNOSIS — W19XXXA Unspecified fall, initial encounter: Secondary | ICD-10-CM | POA: Diagnosis not present

## 2014-03-10 DIAGNOSIS — I10 Essential (primary) hypertension: Secondary | ICD-10-CM | POA: Diagnosis not present

## 2014-03-10 DIAGNOSIS — R5381 Other malaise: Secondary | ICD-10-CM | POA: Diagnosis not present

## 2014-03-11 DIAGNOSIS — E119 Type 2 diabetes mellitus without complications: Secondary | ICD-10-CM | POA: Diagnosis not present

## 2014-03-11 DIAGNOSIS — W19XXXA Unspecified fall, initial encounter: Secondary | ICD-10-CM | POA: Diagnosis not present

## 2014-03-11 DIAGNOSIS — R5381 Other malaise: Secondary | ICD-10-CM | POA: Diagnosis not present

## 2014-03-11 DIAGNOSIS — IMO0001 Reserved for inherently not codable concepts without codable children: Secondary | ICD-10-CM | POA: Diagnosis not present

## 2014-03-11 DIAGNOSIS — I10 Essential (primary) hypertension: Secondary | ICD-10-CM | POA: Diagnosis not present

## 2014-03-11 DIAGNOSIS — F341 Dysthymic disorder: Secondary | ICD-10-CM | POA: Diagnosis not present

## 2014-03-16 DIAGNOSIS — F341 Dysthymic disorder: Secondary | ICD-10-CM | POA: Diagnosis not present

## 2014-03-16 DIAGNOSIS — IMO0001 Reserved for inherently not codable concepts without codable children: Secondary | ICD-10-CM | POA: Diagnosis not present

## 2014-03-16 DIAGNOSIS — W19XXXA Unspecified fall, initial encounter: Secondary | ICD-10-CM | POA: Diagnosis not present

## 2014-03-16 DIAGNOSIS — E119 Type 2 diabetes mellitus without complications: Secondary | ICD-10-CM | POA: Diagnosis not present

## 2014-03-16 DIAGNOSIS — I10 Essential (primary) hypertension: Secondary | ICD-10-CM | POA: Diagnosis not present

## 2014-03-16 DIAGNOSIS — R5381 Other malaise: Secondary | ICD-10-CM | POA: Diagnosis not present

## 2014-03-18 DIAGNOSIS — F341 Dysthymic disorder: Secondary | ICD-10-CM | POA: Diagnosis not present

## 2014-03-18 DIAGNOSIS — I10 Essential (primary) hypertension: Secondary | ICD-10-CM | POA: Diagnosis not present

## 2014-03-18 DIAGNOSIS — IMO0001 Reserved for inherently not codable concepts without codable children: Secondary | ICD-10-CM | POA: Diagnosis not present

## 2014-03-18 DIAGNOSIS — W19XXXA Unspecified fall, initial encounter: Secondary | ICD-10-CM | POA: Diagnosis not present

## 2014-03-18 DIAGNOSIS — E119 Type 2 diabetes mellitus without complications: Secondary | ICD-10-CM | POA: Diagnosis not present

## 2014-03-18 DIAGNOSIS — R5381 Other malaise: Secondary | ICD-10-CM | POA: Diagnosis not present

## 2014-03-23 DIAGNOSIS — F341 Dysthymic disorder: Secondary | ICD-10-CM | POA: Diagnosis not present

## 2014-03-23 DIAGNOSIS — E119 Type 2 diabetes mellitus without complications: Secondary | ICD-10-CM | POA: Diagnosis not present

## 2014-03-23 DIAGNOSIS — I10 Essential (primary) hypertension: Secondary | ICD-10-CM | POA: Diagnosis not present

## 2014-03-23 DIAGNOSIS — W19XXXA Unspecified fall, initial encounter: Secondary | ICD-10-CM | POA: Diagnosis not present

## 2014-03-23 DIAGNOSIS — IMO0001 Reserved for inherently not codable concepts without codable children: Secondary | ICD-10-CM | POA: Diagnosis not present

## 2014-03-23 DIAGNOSIS — R5381 Other malaise: Secondary | ICD-10-CM | POA: Diagnosis not present

## 2014-03-24 DIAGNOSIS — IMO0001 Reserved for inherently not codable concepts without codable children: Secondary | ICD-10-CM | POA: Diagnosis not present

## 2014-03-24 DIAGNOSIS — W19XXXA Unspecified fall, initial encounter: Secondary | ICD-10-CM | POA: Diagnosis not present

## 2014-03-24 DIAGNOSIS — I10 Essential (primary) hypertension: Secondary | ICD-10-CM | POA: Diagnosis not present

## 2014-03-24 DIAGNOSIS — E119 Type 2 diabetes mellitus without complications: Secondary | ICD-10-CM | POA: Diagnosis not present

## 2014-03-24 DIAGNOSIS — R5381 Other malaise: Secondary | ICD-10-CM | POA: Diagnosis not present

## 2014-03-24 DIAGNOSIS — F341 Dysthymic disorder: Secondary | ICD-10-CM | POA: Diagnosis not present

## 2014-03-25 DIAGNOSIS — IMO0001 Reserved for inherently not codable concepts without codable children: Secondary | ICD-10-CM | POA: Diagnosis not present

## 2014-03-25 DIAGNOSIS — F341 Dysthymic disorder: Secondary | ICD-10-CM | POA: Diagnosis not present

## 2014-03-25 DIAGNOSIS — W19XXXA Unspecified fall, initial encounter: Secondary | ICD-10-CM | POA: Diagnosis not present

## 2014-03-25 DIAGNOSIS — E119 Type 2 diabetes mellitus without complications: Secondary | ICD-10-CM | POA: Diagnosis not present

## 2014-03-25 DIAGNOSIS — I10 Essential (primary) hypertension: Secondary | ICD-10-CM | POA: Diagnosis not present

## 2014-03-25 DIAGNOSIS — R5381 Other malaise: Secondary | ICD-10-CM | POA: Diagnosis not present

## 2014-03-30 DIAGNOSIS — F341 Dysthymic disorder: Secondary | ICD-10-CM | POA: Diagnosis not present

## 2014-03-30 DIAGNOSIS — W19XXXA Unspecified fall, initial encounter: Secondary | ICD-10-CM | POA: Diagnosis not present

## 2014-03-30 DIAGNOSIS — IMO0001 Reserved for inherently not codable concepts without codable children: Secondary | ICD-10-CM | POA: Diagnosis not present

## 2014-03-30 DIAGNOSIS — I10 Essential (primary) hypertension: Secondary | ICD-10-CM | POA: Diagnosis not present

## 2014-03-30 DIAGNOSIS — E119 Type 2 diabetes mellitus without complications: Secondary | ICD-10-CM | POA: Diagnosis not present

## 2014-03-30 DIAGNOSIS — R5381 Other malaise: Secondary | ICD-10-CM | POA: Diagnosis not present

## 2014-04-01 DIAGNOSIS — W19XXXA Unspecified fall, initial encounter: Secondary | ICD-10-CM | POA: Diagnosis not present

## 2014-04-01 DIAGNOSIS — F341 Dysthymic disorder: Secondary | ICD-10-CM | POA: Diagnosis not present

## 2014-04-01 DIAGNOSIS — IMO0001 Reserved for inherently not codable concepts without codable children: Secondary | ICD-10-CM | POA: Diagnosis not present

## 2014-04-01 DIAGNOSIS — E119 Type 2 diabetes mellitus without complications: Secondary | ICD-10-CM | POA: Diagnosis not present

## 2014-04-01 DIAGNOSIS — I10 Essential (primary) hypertension: Secondary | ICD-10-CM | POA: Diagnosis not present

## 2014-04-01 DIAGNOSIS — R5381 Other malaise: Secondary | ICD-10-CM | POA: Diagnosis not present

## 2014-04-06 DIAGNOSIS — W19XXXA Unspecified fall, initial encounter: Secondary | ICD-10-CM | POA: Diagnosis not present

## 2014-04-06 DIAGNOSIS — R5381 Other malaise: Secondary | ICD-10-CM | POA: Diagnosis not present

## 2014-04-06 DIAGNOSIS — F341 Dysthymic disorder: Secondary | ICD-10-CM | POA: Diagnosis not present

## 2014-04-06 DIAGNOSIS — E119 Type 2 diabetes mellitus without complications: Secondary | ICD-10-CM | POA: Diagnosis not present

## 2014-04-06 DIAGNOSIS — I10 Essential (primary) hypertension: Secondary | ICD-10-CM | POA: Diagnosis not present

## 2014-04-06 DIAGNOSIS — IMO0001 Reserved for inherently not codable concepts without codable children: Secondary | ICD-10-CM | POA: Diagnosis not present

## 2014-04-08 DIAGNOSIS — IMO0001 Reserved for inherently not codable concepts without codable children: Secondary | ICD-10-CM | POA: Diagnosis not present

## 2014-04-08 DIAGNOSIS — I10 Essential (primary) hypertension: Secondary | ICD-10-CM | POA: Diagnosis not present

## 2014-04-08 DIAGNOSIS — W19XXXA Unspecified fall, initial encounter: Secondary | ICD-10-CM | POA: Diagnosis not present

## 2014-04-08 DIAGNOSIS — E119 Type 2 diabetes mellitus without complications: Secondary | ICD-10-CM | POA: Diagnosis not present

## 2014-04-08 DIAGNOSIS — R5381 Other malaise: Secondary | ICD-10-CM | POA: Diagnosis not present

## 2014-04-08 DIAGNOSIS — F341 Dysthymic disorder: Secondary | ICD-10-CM | POA: Diagnosis not present

## 2014-04-20 DIAGNOSIS — E559 Vitamin D deficiency, unspecified: Secondary | ICD-10-CM | POA: Diagnosis not present

## 2014-04-20 DIAGNOSIS — E1142 Type 2 diabetes mellitus with diabetic polyneuropathy: Secondary | ICD-10-CM | POA: Diagnosis not present

## 2014-04-20 DIAGNOSIS — E119 Type 2 diabetes mellitus without complications: Secondary | ICD-10-CM | POA: Diagnosis not present

## 2014-04-20 DIAGNOSIS — F341 Dysthymic disorder: Secondary | ICD-10-CM | POA: Diagnosis not present

## 2014-04-20 DIAGNOSIS — Z79899 Other long term (current) drug therapy: Secondary | ICD-10-CM | POA: Diagnosis not present

## 2014-04-20 DIAGNOSIS — K5090 Crohn's disease, unspecified, without complications: Secondary | ICD-10-CM | POA: Diagnosis not present

## 2014-04-20 DIAGNOSIS — F17200 Nicotine dependence, unspecified, uncomplicated: Secondary | ICD-10-CM | POA: Diagnosis not present

## 2014-04-20 DIAGNOSIS — I10 Essential (primary) hypertension: Secondary | ICD-10-CM | POA: Diagnosis not present

## 2014-04-20 DIAGNOSIS — G894 Chronic pain syndrome: Secondary | ICD-10-CM | POA: Diagnosis not present

## 2014-04-20 DIAGNOSIS — Z5181 Encounter for therapeutic drug level monitoring: Secondary | ICD-10-CM | POA: Diagnosis not present

## 2014-04-20 DIAGNOSIS — R634 Abnormal weight loss: Secondary | ICD-10-CM | POA: Diagnosis not present

## 2014-04-28 DIAGNOSIS — M5030 Other cervical disc degeneration, unspecified cervical region: Secondary | ICD-10-CM | POA: Diagnosis not present

## 2014-04-28 DIAGNOSIS — M47817 Spondylosis without myelopathy or radiculopathy, lumbosacral region: Secondary | ICD-10-CM | POA: Diagnosis not present

## 2014-05-07 ENCOUNTER — Emergency Department (HOSPITAL_COMMUNITY): Payer: Medicare Other

## 2014-05-07 ENCOUNTER — Encounter (HOSPITAL_COMMUNITY): Payer: Self-pay | Admitting: Emergency Medicine

## 2014-05-07 ENCOUNTER — Inpatient Hospital Stay (HOSPITAL_COMMUNITY)
Admission: EM | Admit: 2014-05-07 | Discharge: 2014-05-14 | DRG: 175 | Disposition: A | Discharge status: Rehabilitation Facility | Payer: Medicare Other | Attending: Internal Medicine | Admitting: Internal Medicine

## 2014-05-07 DIAGNOSIS — F19939 Other psychoactive substance use, unspecified with withdrawal, unspecified: Secondary | ICD-10-CM | POA: Diagnosis present

## 2014-05-07 DIAGNOSIS — E1149 Type 2 diabetes mellitus with other diabetic neurological complication: Secondary | ICD-10-CM | POA: Diagnosis present

## 2014-05-07 DIAGNOSIS — R5381 Other malaise: Secondary | ICD-10-CM | POA: Diagnosis not present

## 2014-05-07 DIAGNOSIS — F1920 Other psychoactive substance dependence, uncomplicated: Secondary | ICD-10-CM | POA: Diagnosis present

## 2014-05-07 DIAGNOSIS — Z880 Allergy status to penicillin: Secondary | ICD-10-CM | POA: Diagnosis not present

## 2014-05-07 DIAGNOSIS — R6510 Systemic inflammatory response syndrome (SIRS) of non-infectious origin without acute organ dysfunction: Secondary | ICD-10-CM | POA: Diagnosis not present

## 2014-05-07 DIAGNOSIS — K5090 Crohn's disease, unspecified, without complications: Secondary | ICD-10-CM | POA: Diagnosis present

## 2014-05-07 DIAGNOSIS — R6520 Severe sepsis without septic shock: Secondary | ICD-10-CM

## 2014-05-07 DIAGNOSIS — S0993XA Unspecified injury of face, initial encounter: Secondary | ICD-10-CM | POA: Diagnosis not present

## 2014-05-07 DIAGNOSIS — Z5189 Encounter for other specified aftercare: Secondary | ICD-10-CM | POA: Diagnosis not present

## 2014-05-07 DIAGNOSIS — H409 Unspecified glaucoma: Secondary | ICD-10-CM | POA: Diagnosis present

## 2014-05-07 DIAGNOSIS — J9600 Acute respiratory failure, unspecified whether with hypoxia or hypercapnia: Secondary | ICD-10-CM | POA: Diagnosis present

## 2014-05-07 DIAGNOSIS — Z681 Body mass index (BMI) 19 or less, adult: Secondary | ICD-10-CM | POA: Diagnosis not present

## 2014-05-07 DIAGNOSIS — A088 Other specified intestinal infections: Secondary | ICD-10-CM | POA: Diagnosis present

## 2014-05-07 DIAGNOSIS — N179 Acute kidney failure, unspecified: Secondary | ICD-10-CM | POA: Diagnosis present

## 2014-05-07 DIAGNOSIS — I10 Essential (primary) hypertension: Secondary | ICD-10-CM | POA: Diagnosis present

## 2014-05-07 DIAGNOSIS — I951 Orthostatic hypotension: Secondary | ICD-10-CM | POA: Diagnosis present

## 2014-05-07 DIAGNOSIS — J9601 Acute respiratory failure with hypoxia: Secondary | ICD-10-CM

## 2014-05-07 DIAGNOSIS — B1920 Unspecified viral hepatitis C without hepatic coma: Secondary | ICD-10-CM | POA: Diagnosis present

## 2014-05-07 DIAGNOSIS — F1010 Alcohol abuse, uncomplicated: Secondary | ICD-10-CM | POA: Diagnosis present

## 2014-05-07 DIAGNOSIS — F17200 Nicotine dependence, unspecified, uncomplicated: Secondary | ICD-10-CM | POA: Diagnosis present

## 2014-05-07 DIAGNOSIS — R627 Adult failure to thrive: Secondary | ICD-10-CM | POA: Diagnosis present

## 2014-05-07 DIAGNOSIS — D72829 Elevated white blood cell count, unspecified: Secondary | ICD-10-CM | POA: Diagnosis not present

## 2014-05-07 DIAGNOSIS — F411 Generalized anxiety disorder: Secondary | ICD-10-CM | POA: Diagnosis not present

## 2014-05-07 DIAGNOSIS — J45909 Unspecified asthma, uncomplicated: Secondary | ICD-10-CM | POA: Diagnosis present

## 2014-05-07 DIAGNOSIS — K7460 Unspecified cirrhosis of liver: Secondary | ICD-10-CM

## 2014-05-07 DIAGNOSIS — F329 Major depressive disorder, single episode, unspecified: Secondary | ICD-10-CM | POA: Diagnosis not present

## 2014-05-07 DIAGNOSIS — Z888 Allergy status to other drugs, medicaments and biological substances status: Secondary | ICD-10-CM

## 2014-05-07 DIAGNOSIS — K861 Other chronic pancreatitis: Secondary | ICD-10-CM | POA: Diagnosis present

## 2014-05-07 DIAGNOSIS — R112 Nausea with vomiting, unspecified: Secondary | ICD-10-CM

## 2014-05-07 DIAGNOSIS — R0602 Shortness of breath: Secondary | ICD-10-CM | POA: Diagnosis not present

## 2014-05-07 DIAGNOSIS — E43 Unspecified severe protein-calorie malnutrition: Secondary | ICD-10-CM | POA: Diagnosis present

## 2014-05-07 DIAGNOSIS — E860 Dehydration: Secondary | ICD-10-CM | POA: Diagnosis present

## 2014-05-07 DIAGNOSIS — E119 Type 2 diabetes mellitus without complications: Secondary | ICD-10-CM

## 2014-05-07 DIAGNOSIS — E872 Acidosis, unspecified: Secondary | ICD-10-CM | POA: Diagnosis present

## 2014-05-07 DIAGNOSIS — E1142 Type 2 diabetes mellitus with diabetic polyneuropathy: Secondary | ICD-10-CM | POA: Diagnosis present

## 2014-05-07 DIAGNOSIS — S298XXA Other specified injuries of thorax, initial encounter: Secondary | ICD-10-CM | POA: Diagnosis not present

## 2014-05-07 DIAGNOSIS — F3289 Other specified depressive episodes: Secondary | ICD-10-CM | POA: Diagnosis not present

## 2014-05-07 DIAGNOSIS — R197 Diarrhea, unspecified: Secondary | ICD-10-CM

## 2014-05-07 DIAGNOSIS — I2699 Other pulmonary embolism without acute cor pulmonale: Secondary | ICD-10-CM | POA: Diagnosis not present

## 2014-05-07 DIAGNOSIS — M542 Cervicalgia: Secondary | ICD-10-CM | POA: Diagnosis not present

## 2014-05-07 DIAGNOSIS — Z720 Tobacco use: Secondary | ICD-10-CM | POA: Diagnosis present

## 2014-05-07 DIAGNOSIS — E118 Type 2 diabetes mellitus with unspecified complications: Secondary | ICD-10-CM | POA: Diagnosis present

## 2014-05-07 DIAGNOSIS — R55 Syncope and collapse: Secondary | ICD-10-CM

## 2014-05-07 DIAGNOSIS — Z8719 Personal history of other diseases of the digestive system: Secondary | ICD-10-CM

## 2014-05-07 DIAGNOSIS — J9819 Other pulmonary collapse: Secondary | ICD-10-CM | POA: Diagnosis not present

## 2014-05-07 DIAGNOSIS — Z91041 Radiographic dye allergy status: Secondary | ICD-10-CM | POA: Diagnosis not present

## 2014-05-07 DIAGNOSIS — K8689 Other specified diseases of pancreas: Secondary | ICD-10-CM | POA: Diagnosis not present

## 2014-05-07 DIAGNOSIS — A419 Sepsis, unspecified organism: Secondary | ICD-10-CM | POA: Diagnosis not present

## 2014-05-07 DIAGNOSIS — K50919 Crohn's disease, unspecified, with unspecified complications: Secondary | ICD-10-CM

## 2014-05-07 DIAGNOSIS — R51 Headache: Secondary | ICD-10-CM | POA: Diagnosis not present

## 2014-05-07 HISTORY — DX: Crohn's disease, unspecified, without complications: K50.90

## 2014-05-07 HISTORY — DX: Unspecified asthma, uncomplicated: J45.909

## 2014-05-07 LAB — CBC WITH DIFFERENTIAL/PLATELET
Basophils Absolute: 0 10*3/uL (ref 0.0–0.1)
Basophils Relative: 0 % (ref 0–1)
Eosinophils Absolute: 0 10*3/uL (ref 0.0–0.7)
Eosinophils Relative: 0 % (ref 0–5)
HCT: 36.9 % (ref 36.0–46.0)
HEMOGLOBIN: 12.3 g/dL (ref 12.0–15.0)
LYMPHS ABS: 2.4 10*3/uL (ref 0.7–4.0)
Lymphocytes Relative: 14 % (ref 12–46)
MCH: 30.3 pg (ref 26.0–34.0)
MCHC: 33.3 g/dL (ref 30.0–36.0)
MCV: 90.9 fL (ref 78.0–100.0)
Monocytes Absolute: 1.1 10*3/uL — ABNORMAL HIGH (ref 0.1–1.0)
Monocytes Relative: 6 % (ref 3–12)
NEUTROS ABS: 13.9 10*3/uL — AB (ref 1.7–7.7)
NEUTROS PCT: 80 % — AB (ref 43–77)
Platelets: 348 10*3/uL (ref 150–400)
RBC: 4.06 MIL/uL (ref 3.87–5.11)
RDW: 16.9 % — ABNORMAL HIGH (ref 11.5–15.5)
WBC: 17.5 10*3/uL — ABNORMAL HIGH (ref 4.0–10.5)

## 2014-05-07 LAB — COMPREHENSIVE METABOLIC PANEL
ALBUMIN: 2.5 g/dL — AB (ref 3.5–5.2)
ALK PHOS: 219 U/L — AB (ref 39–117)
ALT: 31 U/L (ref 0–35)
AST: 32 U/L (ref 0–37)
Anion gap: 19 — ABNORMAL HIGH (ref 5–15)
BUN: 15 mg/dL (ref 6–23)
CHLORIDE: 96 meq/L (ref 96–112)
CO2: 21 mEq/L (ref 19–32)
Calcium: 8.1 mg/dL — ABNORMAL LOW (ref 8.4–10.5)
Creatinine, Ser: 1.34 mg/dL — ABNORMAL HIGH (ref 0.50–1.10)
GFR calc non Af Amer: 44 mL/min — ABNORMAL LOW (ref >90)
GFR, EST AFRICAN AMERICAN: 51 mL/min — AB (ref >90)
GLUCOSE: 135 mg/dL — AB (ref 70–99)
POTASSIUM: 4.5 meq/L (ref 3.7–5.3)
Sodium: 136 mEq/L — ABNORMAL LOW (ref 137–147)
Total Bilirubin: 0.4 mg/dL (ref 0.3–1.2)
Total Protein: 6 g/dL (ref 6.0–8.3)

## 2014-05-07 LAB — URINALYSIS, ROUTINE W REFLEX MICROSCOPIC
Glucose, UA: NEGATIVE mg/dL
Hgb urine dipstick: NEGATIVE
Ketones, ur: NEGATIVE mg/dL
Leukocytes, UA: NEGATIVE
Nitrite: NEGATIVE
Protein, ur: 30 mg/dL — AB
SPECIFIC GRAVITY, URINE: 1.022 (ref 1.005–1.030)
UROBILINOGEN UA: 0.2 mg/dL (ref 0.0–1.0)
pH: 5 (ref 5.0–8.0)

## 2014-05-07 LAB — URINE MICROSCOPIC-ADD ON

## 2014-05-07 LAB — LIPASE, BLOOD: Lipase: 11 U/L (ref 11–59)

## 2014-05-07 LAB — I-STAT CG4 LACTIC ACID, ED: LACTIC ACID, VENOUS: 4.51 mmol/L — AB (ref 0.5–2.2)

## 2014-05-07 LAB — I-STAT TROPONIN, ED: Troponin i, poc: 0.01 ng/mL (ref 0.00–0.08)

## 2014-05-07 LAB — D-DIMER, QUANTITATIVE (NOT AT ARMC): D DIMER QUANT: 1.93 ug{FEU}/mL — AB (ref 0.00–0.48)

## 2014-05-07 MED ORDER — DEXTROSE 5 % IV SOLN
1.0000 g | INTRAVENOUS | Status: DC
  Filled 2014-05-07 (×2): qty 1

## 2014-05-07 MED ORDER — ENOXAPARIN SODIUM 40 MG/0.4ML ~~LOC~~ SOLN
1.0000 mg/kg | SUBCUTANEOUS | Status: AC
  Administered 2014-05-08: 40 mg via SUBCUTANEOUS
  Filled 2014-05-07: qty 0.4

## 2014-05-07 MED ORDER — ONDANSETRON HCL 4 MG/2ML IJ SOLN
4.0000 mg | Freq: Once | INTRAMUSCULAR | Status: AC
  Administered 2014-05-07: 4 mg via INTRAVENOUS
  Filled 2014-05-07: qty 2

## 2014-05-07 MED ORDER — VANCOMYCIN HCL IN DEXTROSE 1-5 GM/200ML-% IV SOLN: 1000.0000 mg | Freq: Once | INTRAVENOUS | Status: DC

## 2014-05-07 MED ORDER — DIPHENHYDRAMINE HCL 50 MG/ML IJ SOLN
25.0000 mg | Freq: Once | INTRAMUSCULAR | Status: AC
  Administered 2014-05-07: 25 mg via INTRAVENOUS
  Filled 2014-05-07: qty 1

## 2014-05-07 MED ORDER — SODIUM CHLORIDE 0.9 % IV BOLUS (SEPSIS)
1000.0000 mL | Freq: Once | INTRAVENOUS | Status: AC
  Administered 2014-05-08: 1000 mL via INTRAVENOUS

## 2014-05-07 MED ORDER — SODIUM CHLORIDE 0.9 % IV BOLUS (SEPSIS)
1000.0000 mL | Freq: Once | INTRAVENOUS | Status: AC
  Administered 2014-05-07: 1000 mL via INTRAVENOUS

## 2014-05-07 MED ORDER — METOCLOPRAMIDE HCL 5 MG/ML IJ SOLN
10.0000 mg | Freq: Once | INTRAMUSCULAR | Status: AC
  Administered 2014-05-07: 10 mg via INTRAVENOUS
  Filled 2014-05-07: qty 2

## 2014-05-07 MED ORDER — HYDROMORPHONE HCL PF 1 MG/ML IJ SOLN
1.0000 mg | Freq: Once | INTRAMUSCULAR | Status: AC
  Administered 2014-05-07: 1 mg via INTRAVENOUS
  Filled 2014-05-07: qty 1

## 2014-05-07 MED ORDER — SODIUM CHLORIDE 0.9 % IV BOLUS (SEPSIS): 500.0000 mL | Freq: Once | INTRAVENOUS | Status: DC

## 2014-05-07 NOTE — Progress Notes (Addendum)
ANTIBIOTIC CONSULT NOTE - INITIAL  Pharmacy Consult for Cefepime Indication: Bacteremia, r/o  Allergies  Allergen Reactions  . Iohexol Other (See Comments)    "severe burning"  . Penicillins Hives    Patient Measurements: Height: 5\' 6"  (167.6 cm) Weight: 83 lb (37.649 kg) IBW/kg (Calculated) : 59.3  Vital Signs: Temp: 97.7 F (36.5 C) (08/07 2007) Temp src: Oral (08/07 2007) BP: 80/63 mmHg (08/07 2241) Pulse Rate: 73 (08/07 2241) Intake/Output from previous day:   Intake/Output from this shift:    Labs:  Recent Labs  05/07/14 2102  WBC 17.5*  HGB 12.3  PLT 348  CREATININE 1.34*   Estimated Creatinine Clearance: 28.2 ml/min (by C-G formula based on Cr of 1.34).  Medical History: Past Medical History  Diagnosis Date  . Glaucoma   . Diabetes mellitus without complication   . Hypertension   . Hepatitis C   . Pancreatitis   . Neuropathy    Assessment: 53 y/o F here with shortness of breath, leukocytosis present, elevated Scr, other labs as above.   Plan:  -Cefepime 1g IV q24h, increase dose if renal function improves -Trend WBC, temp, renal function   Abran DukeLedford, Kasidi Shanker 05/07/2014,11:09 PM  ===================== Changing from cefepime to Levaquin -Levaquin 750 mg IV Theresa Mulliganq48h JLedford, PharmD

## 2014-05-07 NOTE — ED Provider Notes (Addendum)
CSN: [id number]     Arrival date & time 05/07/14  1953 History   First MD Initiated Contact with Patient 05/07/14 2029     Chief Complaint  Patient presents with  . Shortness of Breath  . Neck Pain     (Consider location/radiation/quality/duration/timing/severity/associated sxs/prior Treatment) The history is provided by the patient.  [name] is a 53 y.o. female hx of DM, hepatitis C, pancreatitis here with abdominal pain, syncope, shortness of breath. Chronic abdominal pain, associated with vomiting and diarrhea. For the last several days, she has been having several episodes of syncope. Today, was walking to the bathroom, and suddenly felt dizzy and passed out and hit her head. She then felt short of breath as well. EMS was called, noticed that she was wheezing so gave her albuterol en route. She is a smoker.Has chronic neck pain.    Past Medical History  Diagnosis Date  . Glaucoma   . Diabetes mellitus without complication   . Hypertension   . Hepatitis C   . Pancreatitis   . Neuropathy    Past Surgical History  Procedure Laterality Date  . Cesarean section     No family history on file. History  Substance Use Topics  . Smoking status: Current Every Day Smoker  . Smokeless tobacco: Not on file  . Alcohol Use: Yes   OB History   Grav Para Term Preterm Abortions TAB SAB Ect Mult Living                 Review of Systems  Respiratory: Positive for shortness of breath.   Musculoskeletal: Positive for neck pain.  Neurological: Positive for syncope.  All other systems reviewed and are negative.     Allergies  Iohexol and Penicillins  Home Medications   Prior to Admission medications   Medication Sig Start Date End Date Taking? Authorizing Provider  alprazolam Prudy Feeler) 2 MG tablet Take 2 mg by mouth 3 (three) times daily as needed for anxiety.   Yes Historical Provider, MD  amitriptyline (ELAVIL) 50 MG tablet Take 1 tablet (50 mg total) by mouth daily. 10/24/12   Yes Alison Murray, MD  cetirizine (ZYRTEC) 10 MG tablet Take 10 mg by mouth every morning.   Yes Historical Provider, MD  folic acid (FOLVITE) 1 MG tablet Take 1 tablet (1 mg total) by mouth daily. 10/24/12  Yes Alison Murray, MD  gabapentin (NEURONTIN) 300 MG capsule Take 1,200-1,500 mg by mouth 3 (three) times daily.    Yes Historical Provider, MD  glimepiride (AMARYL) 2 MG tablet Take 2 mg by mouth daily as needed (diet).    Yes Historical Provider, MD  HYDROcodone-acetaminophen (NORCO) 10-325 MG per tablet Take 1 tablet by mouth every 6 (six) hours as needed for moderate pain.   Yes Historical Provider, MD  HYDROmorphone (DILAUDID) 8 MG tablet Take 8 mg by mouth 2 (two) times daily as needed for severe pain.   Yes Historical Provider, MD  lisinopril (PRINIVIL,ZESTRIL) 5 MG tablet Take 5 mg by mouth at bedtime.    Yes Historical Provider, MD  meclizine (ANTIVERT) 25 MG tablet Take 25 mg by mouth 3 (three) times daily as needed for dizziness.    Yes Historical Provider, MD  methocarbamol (ROBAXIN) 750 MG tablet Take 1 tablet (750 mg total) by mouth 2 (two) times daily as needed. For muscle spasms 10/24/12  Yes Alison Murray, MD  ondansetron (ZOFRAN) 4 MG tablet Take 4 mg by mouth every 8 (eight)  hours as needed for nausea. 10/05/13  Yes Roxy Horseman, PA-C  pantoprazole (PROTONIX) 20 MG tablet Take 40 mg by mouth 2 (two) times daily.   Yes Historical Provider, MD  polyethylene glycol (MIRALAX / GLYCOLAX) packet Take 17 g by mouth daily.   Yes Historical Provider, MD  promethazine (PHENERGAN) 25 MG tablet Take 25 mg by mouth 2 (two) times daily as needed. For nausea   Yes Historical Provider, MD  simvastatin (ZOCOR) 40 MG tablet Take 40 mg by mouth at bedtime.    Yes Historical Provider, MD  sulfaSALAzine (AZULFIDINE) 500 MG tablet Take 1,000 mg by mouth daily.    Yes Historical Provider, MD  thiamine 100 MG tablet Take 1 tablet (100 mg total) by mouth daily. 10/24/12  Yes Alison Murray, MD   tiotropium (SPIRIVA) 18 MCG inhalation capsule Place 18 mcg into inhaler and inhale daily.   Yes Historical Provider, MD  traZODone (DESYREL) 50 MG tablet Take 50 mg by mouth daily.     Yes Historical Provider, MD  venlafaxine (EFFEXOR) 25 MG tablet Take 25 mg by mouth 2 (two) times daily.   Yes Historical Provider, MD   BP 95/73  Pulse 110  Temp(Src) 97.7 F (36.5 C) (Oral)  Resp 18  Ht 5\' 6"  (1.676 m)  Wt 83 lb (37.649 kg)  BMI 13.40 kg/m2  SpO2 99% Physical Exam  Nursing note and vitals reviewed. Constitutional: She is oriented to person, place, and time.  Chronically ill, uncomfortable   HENT:  Head: Normocephalic and atraumatic.  Mouth/Throat: Oropharynx is clear and moist.  Eyes: Conjunctivae and EOM are normal. Pupils are equal, round, and reactive to light.  Neck: Normal range of motion. Neck supple.  Cardiovascular: Regular rhythm and normal heart sounds.   Slightly tachy   Pulmonary/Chest:  Dec air movement throughout. ? Crackles. Minimal wheezing.   Abdominal: Soft.  + diffuse lower abdominal tenderness, no rebound   Musculoskeletal: Normal range of motion. She exhibits no edema and no tenderness.  Neurological: She is alert and oriented to person, place, and time. No cranial nerve deficit. Coordination normal.  Skin: Skin is warm and dry.  Psychiatric: She has a normal mood and affect. Her behavior is normal. Judgment and thought content normal.    ED Course  Procedures (including critical care time)  CRITICAL CARE Performed by: Silverio Lay, DAVID   Total critical care time: 30 min   Critical care time was exclusive of separately billable procedures and treating other patients.  Critical care was necessary to treat or prevent imminent or life-threatening deterioration.  Critical care was time spent personally by me on the following activities: development of treatment plan with patient and/or surrogate as well as nursing, discussions with consultants, evaluation  of patient's response to treatment, examination of patient, obtaining history from patient or surrogate, ordering and performing treatments and interventions, ordering and review of laboratory studies, ordering and review of radiographic studies, pulse oximetry and re-evaluation of patient's condition.   Labs Review Labs Reviewed  CBC WITH DIFFERENTIAL - Abnormal; Notable for the following:    WBC 17.5 (*)    RDW 16.9 (*)    Neutrophils Relative % 80 (*)    Neutro Abs 13.9 (*)    Monocytes Absolute 1.1 (*)    All other components within normal limits  COMPREHENSIVE METABOLIC PANEL - Abnormal; Notable for the following:    Sodium 136 (*)    Glucose, Bld 135 (*)    Creatinine, Ser 1.34 (*)  Calcium 8.1 (*)    Albumin 2.5 (*)    Alkaline Phosphatase 219 (*)    GFR calc non Af Amer 44 (*)    GFR calc Af Amer 51 (*)    Anion gap 19 (*)    All other components within normal limits  D-DIMER, QUANTITATIVE - Abnormal; Notable for the following:    D-Dimer, Quant 1.93 (*)    All other components within normal limits  URINALYSIS, ROUTINE W REFLEX MICROSCOPIC - Abnormal; Notable for the following:    Color, Urine AMBER (*)    APPearance CLOUDY (*)    Bilirubin Urine MODERATE (*)    Protein, ur 30 (*)    All other components within normal limits  URINE MICROSCOPIC-ADD ON - Abnormal; Notable for the following:    Bacteria, UA FEW (*)    Casts GRANULAR CAST (*)    All other components within normal limits  I-STAT CG4 LACTIC ACID, ED - Abnormal; Notable for the following:    Lactic Acid, Venous 4.51 (*)    All other components within normal limits  CULTURE, BLOOD (ROUTINE X 2)  CULTURE, BLOOD (ROUTINE X 2)  URINE CULTURE  LIPASE, BLOOD  I-STAT TROPOININ, ED    Imaging Review Ct Abdomen Pelvis Wo Contrast  05/07/2014   CLINICAL DATA:  Diffuse abdominal pain with nausea vomiting and diarrhea  EXAM: CT ABDOMEN AND PELVIS WITHOUT CONTRAST  TECHNIQUE: Multidetector CT imaging of the  abdomen and pelvis was performed following the standard protocol without IV contrast.  COMPARISON:  07/30/2011 CT and 10/05/2013 ultrasound  FINDINGS: Visualized portions of the lung bases are clear.  Stable 2.5 cm low-attenuation lesion anterior right dome of the liver. 5 mm low-attenuation round lesion posterior right lobe of liver image number 22, not visible on the prior study. Gallbladder is mildly prominent, but stable in appearance. Common bile duct is significantly distended to about 1.5 cm, which is a stable finding.  Spleen is normal. Pancreatic duct is prominent. It measures up to about 6 mm, which is more prominent when compared to the prior study. The pancreas is otherwise normal except for moderate diffuse atrophy.  Adrenal glands are normal. There are a few punctate calcifications in both kidneys suggesting tiny nonobstructing calculi.  There is mild to moderate calcification of the abdominal aorta. There is no aortic dilatation identified. Bladder is relatively decompressed. Reproductive organs not clearly identified. No ascites.  There is a nonobstructive bowel gas pattern. Small and large bowel appear normal. Similar to prior study, there is some degree of gastric distention. Appendix is normal.  There are no acute musculoskeletal findings.  IMPRESSION: 1. Stable low-attenuation lesion in the dome of the liver, likely benign 2. Stable dilatation of the common bile duct. 3. Interval increase in the dilatation of the pancreatic duct. Patient with previous history of pancreatitis. Consider MRCP or ERCP to further evaluate, as obstructing lesion causing pancreatic ductal dilatation is not excluded.   Electronically Signed   By: Esperanza Heir M.D.   On: 05/07/2014 23:51   Dg Chest 2 View  05/07/2014   CLINICAL DATA:  Multiple falls.  Concern for chest injury.  EXAM: CHEST  2 VIEW  COMPARISON:  Chest radiograph from 11/20/2012  FINDINGS: The lungs are well-aerated and clear. There is no evidence of  focal opacification, pleural effusion or pneumothorax.  The heart is normal in size; the mediastinal contour is within normal limits. No acute osseous abnormalities are seen.  IMPRESSION: No acute cardiopulmonary process seen.  No displaced rib fractures identified.   Electronically Signed   By: Roanna RaiderJeffery  Chang M.D.   On: 05/07/2014 22:17   Dg Cervical Spine Complete  05/07/2014   CLINICAL DATA:  Multiple falls.  Neck pain.  EXAM: CERVICAL SPINE  4+ VIEWS  COMPARISON:  MRI of the cervical spine performed 04/29/2008  FINDINGS: There is no evidence of fracture or subluxation. Vertebral bodies demonstrate normal height and alignment. Intervertebral disc spaces are preserved. Prevertebral soft tissues are within normal limits. The provided odontoid view demonstrates no significant abnormality.  The visualized lung apices are clear.  IMPRESSION: No evidence of fracture or subluxation along the cervical spine.   Electronically Signed   By: Roanna RaiderJeffery  Chang M.D.   On: 05/07/2014 22:19   Ct Head Wo Contrast  05/07/2014   CLINICAL DATA:  Pain at the top of the head, radiating down the neck. Nausea, vomiting and diarrhea.  EXAM: CT HEAD WITHOUT CONTRAST  TECHNIQUE: Contiguous axial images were obtained from the base of the skull through the vertex without intravenous contrast.  COMPARISON:  CT of the head performed 10/21/2012  FINDINGS: There is no evidence of acute infarction, mass lesion, or intra- or extra-axial hemorrhage on CT.  Prominence of the sulci suggests mild cortical volume loss. Mild cerebellar atrophy is noted. Mild periventricular white matter change likely reflects small vessel ischemic microangiopathy.  The brainstem and fourth ventricle are within normal limits. The basal ganglia are unremarkable in appearance. The cerebral hemispheres demonstrate grossly normal gray-white differentiation. No mass effect or midline shift is seen.  There is no evidence of fracture; visualized osseous structures are  unremarkable in appearance. The orbits are within normal limits. The paranasal sinuses and mastoid air cells are well-aerated. No significant soft tissue abnormalities are seen.  IMPRESSION: 1. No evidence of traumatic intracranial injury or fracture. 2. Mild cortical volume loss and scattered small vessel ischemic microangiopathy.   Electronically Signed   By: Roanna RaiderJeffery  Chang M.D.   On: 05/07/2014 23:44     EKG Interpretation   Date/Time:  Friday May 07 2014 20:07:44 EDT Ventricular Rate:  119 PR Interval:  125 QRS Duration: 60 QT Interval:  298 QTC Calculation: 419 R Axis:   57 Text Interpretation:  Sinus tachycardia Right atrial enlargement  Nonspecific T abnormalities, lateral leads No significant change since  last tracing Confirmed by YAO  MD, DAVID ([id number]) on 05/07/2014 8:43:02 PM      MDM   Final diagnoses:  Severe sepsis  Syncope, unspecified syncope type    [name] is a 53 y.o. female here with syncope, SOB, ab pain. She is tachy and hypotensive. Consider ACS vs PE vs sepsis. Will also need to r/o intra abdominal process. Will get sepsis workup. She is allergic to IV dye so will get d-dimer. Will get CT ab/pel.   11:10 PM  D-dimer positive. But patient also septic with WBC 17, lactate 4.5. CXR clear. UA pending. Given 2 L NS, BP now mid 90s but has good mental status. Will give vanc/cefepime empirically. Elevated D-dimer likely from underlying sepsis. Will need to admit to stepdown.   11:56 PM Talked to hospitalist. Cultures ordered. CT showed no acute pathology. Will give lovenox empirically given d-dimer elevated. Will admit to stepdown.       Richardean Canalavid H Yao, MD 05/07/14 [id number]  Richardean Canalavid H Yao, MD 05/07/14 [id number]  Richardean Canalavid H Yao, MD 05/07/14 [phone]

## 2014-05-07 NOTE — ED Notes (Signed)
Dr. Yao at bedside. 

## 2014-05-07 NOTE — ED Notes (Signed)
Phlebotomy at bedside.

## 2014-05-07 NOTE — ED Notes (Signed)
i-stat CG4+ result shown to Dr. Silverio LayYao

## 2014-05-07 NOTE — ED Notes (Signed)
MD at bedside. 

## 2014-05-07 NOTE — H&P (Addendum)
[facility] History and Physical  [name] UEA:[id number] DOB: [birth year] DOA: 05/07/2014   PCP: Jackie Plum, MD   Chief Complaint: syncope  HPI: [name] is a 53 y.o. female with DM, HTN, Crohn's disease who smoke presents for and episode of syncope today and reports that she has been passing out frequently for 3 wks. She has been lightheaded while walking. She does not have seizure like activity when she passed out.   She is found to be hypotensive and tachycardic with Lactic acidosis and leukocytosis. She has been vomiting and having loose stools for months but her symptoms have been worse over the past couple of weeks. No blood noted in vomitus or in stool. She has lost a significant amount of weight but unable to quantify. She admits to a fevers as high as 102 - last fever was probably on Wednesday. She was short of breath today as well and admits to a dry cough. No dysuria or rash. She was admitted this past winter with sepsis suspected to be from influenza.   NOTE: PMH per EPIC notes Hep C and pancreatitis but patient denies this  General: + anorexia,  + weight loss, + fever of 102 a few days ago.  Cardiac: Denies chest pain, syncope, palpitations, pedal edema  Respiratory: Denies shortness of breath, wheezing, + dry cough  GI: Denies severe indigestion/heartburn- other GI history above GU: Denies hematuria, incontinence, dysuria  Musculoskeletal: Denies arthritis  Skin: Denies suspicious skin lesions Neurologic: Denies focal weakness or numbness, change in vision  Past Medical History  Diagnosis Date  . Glaucoma   . Diabetes mellitus without complication   . Hypertension   . Neuropathy   Crohn's disease  Past Surgical History  Procedure Laterality Date  . Cesarean section      Social History: smoker- started in her 19s- trying to quit- down to 5 cig/ a day, ETOH occasionally Lives at home with family Occasionally need help with ADLs  Allergies   Allergen Reactions  . Iohexol Other (See Comments)    "severe burning"  . Penicillins Hives    No family history on file.    Prior to Admission medications   Medication Sig Start Date End Date Taking? Authorizing Provider  alprazolam Prudy Feeler) 2 MG tablet Take 2 mg by mouth 3 (three) times daily as needed for anxiety.   Yes Historical Provider, MD  amitriptyline (ELAVIL) 50 MG tablet Take 1 tablet (50 mg total) by mouth daily. 10/24/12  Yes Alison Murray, MD  cetirizine (ZYRTEC) 10 MG tablet Take 10 mg by mouth every morning.   Yes Historical Provider, MD  folic acid (FOLVITE) 1 MG tablet Take 1 tablet (1 mg total) by mouth daily. 10/24/12  Yes Alison Murray, MD  gabapentin (NEURONTIN) 300 MG capsule Take 1,200-1,500 mg by mouth 3 (three) times daily.    Yes Historical Provider, MD  glimepiride (AMARYL) 2 MG tablet Take 2 mg by mouth daily as needed (diet).    Yes Historical Provider, MD  HYDROcodone-acetaminophen (NORCO) 10-325 MG per tablet Take 1 tablet by mouth every 6 (six) hours as needed for moderate pain.   Yes Historical Provider, MD  HYDROmorphone (DILAUDID) 8 MG tablet Take 8 mg by mouth 2 (two) times daily as needed for severe pain.   Yes Historical Provider, MD  lisinopril (PRINIVIL,ZESTRIL) 5 MG tablet Take 5 mg by mouth at bedtime.    Yes Historical Provider, MD  meclizine (ANTIVERT) 25 MG tablet Take 25 mg  by mouth 3 (three) times daily as needed for dizziness.    Yes Historical Provider, MD  methocarbamol (ROBAXIN) 750 MG tablet Take 1 tablet (750 mg total) by mouth 2 (two) times daily as needed. For muscle spasms 10/24/12  Yes Alison Murray, MD  ondansetron (ZOFRAN) 4 MG tablet Take 4 mg by mouth every 8 (eight) hours as needed for nausea. 10/05/13  Yes Roxy Horseman, PA-C  pantoprazole (PROTONIX) 20 MG tablet Take 40 mg by mouth 2 (two) times daily.   Yes Historical Provider, MD  polyethylene glycol (MIRALAX / GLYCOLAX) packet Take 17 g by mouth daily.   Yes Historical  Provider, MD  promethazine (PHENERGAN) 25 MG tablet Take 25 mg by mouth 2 (two) times daily as needed. For nausea   Yes Historical Provider, MD  simvastatin (ZOCOR) 40 MG tablet Take 40 mg by mouth at bedtime.    Yes Historical Provider, MD  sulfaSALAzine (AZULFIDINE) 500 MG tablet Take 1,000 mg by mouth daily.    Yes Historical Provider, MD  thiamine 100 MG tablet Take 1 tablet (100 mg total) by mouth daily. 10/24/12  Yes Alison Murray, MD  tiotropium (SPIRIVA) 18 MCG inhalation capsule Place 18 mcg into inhaler and inhale daily.   Yes Historical Provider, MD  traZODone (DESYREL) 50 MG tablet Take 50 mg by mouth daily.     Yes Historical Provider, MD  venlafaxine (EFFEXOR) 25 MG tablet Take 25 mg by mouth 2 (two) times daily.   Yes Historical Provider, MD     Physical Exam: Filed Vitals:   05/07/14 2241 05/07/14 2300 05/07/14 2306 05/07/14 2345  BP: 80/63 95/73  85/72  Pulse: 73  110   Temp:      TempSrc:      Resp: 18 24 18 26   Height:      Weight:      SpO2: 100%  99%      General: AAO x 3, thin, frail looking female with no acute distress HEENT: Normocephalic and Atraumatic, Mucous membranes pink                PERRLA; EOM intact; No scleral icterus,                 Nares: Patent, Oropharynx: Clear, Fair Dentition                 Neck: FROM, no cervical lymphadenopathy, thyromegaly, carotid bruit or JVD;  Breasts: deferred CHEST WALL: No tenderness  CHEST: very poor air entry with decreased breath sounds, no tachypnea -some upper airway wheeze HEART: Regular rate and rhythm- tachycardic with HR 130-140s; no murmurs rubs or gallops  BACK: No kyphosis or scoliosis; no CVA tenderness  ABDOMEN: Positive Bowel Sounds, soft, - diffusely tender; no masses, no organomegaly and no rebound tenderness Rectal Exam: deferred EXTREMITIES: No cyanosis, clubbing, or edema Genitalia: not examined  SKIN:  no rash or ulceration  CNS: Alert and Oriented x 4, Nonfocal exam, CN 2-12  intact  Labs on Admission:  Basic Metabolic Panel:  Recent Labs Lab 05/07/14 2102  NA 136*  K 4.5  CL 96  CO2 21  GLUCOSE 135*  BUN 15  CREATININE 1.34*  CALCIUM 8.1*   Liver Function Tests:  Recent Labs Lab 05/07/14 2102  AST 32  ALT 31  ALKPHOS 219*  BILITOT 0.4  PROT 6.0  ALBUMIN 2.5*    Recent Labs Lab 05/07/14 2102  LIPASE 11   No results found for this basename: AMMONIA,  in the last 168 hours CBC:  Recent Labs Lab 05/07/14 2102  WBC 17.5*  NEUTROABS 13.9*  HGB 12.3  HCT 36.9  MCV 90.9  PLT 348   Cardiac Enzymes: No results found for this basename: CKTOTAL, CKMB, CKMBINDEX, TROPONINI,  in the last 168 hours  BNP (last 3 results) No results found for this basename: PROBNP,  in the last 8760 hours CBG: No results found for this basename: GLUCAP,  in the last 168 hours  Radiological Exams on Admission: Ct Abdomen Pelvis Wo Contrast  05/07/2014   CLINICAL DATA:  Diffuse abdominal pain with nausea vomiting and diarrhea  EXAM: CT ABDOMEN AND PELVIS WITHOUT CONTRAST  TECHNIQUE: Multidetector CT imaging of the abdomen and pelvis was performed following the standard protocol without IV contrast.  COMPARISON:  07/30/2011 CT and 10/05/2013 ultrasound  FINDINGS: Visualized portions of the lung bases are clear.  Stable 2.5 cm low-attenuation lesion anterior right dome of the liver. 5 mm low-attenuation round lesion posterior right lobe of liver image number 22, not visible on the prior study. Gallbladder is mildly prominent, but stable in appearance. Common bile duct is significantly distended to about 1.5 cm, which is a stable finding.  Spleen is normal. Pancreatic duct is prominent. It measures up to about 6 mm, which is more prominent when compared to the prior study. The pancreas is otherwise normal except for moderate diffuse atrophy.  Adrenal glands are normal. There are a few punctate calcifications in both kidneys suggesting tiny nonobstructing calculi.   There is mild to moderate calcification of the abdominal aorta. There is no aortic dilatation identified. Bladder is relatively decompressed. Reproductive organs not clearly identified. No ascites.  There is a nonobstructive bowel gas pattern. Small and large bowel appear normal. Similar to prior study, there is some degree of gastric distention. Appendix is normal.  There are no acute musculoskeletal findings.  IMPRESSION: 1. Stable low-attenuation lesion in the dome of the liver, likely benign 2. Stable dilatation of the common bile duct. 3. Interval increase in the dilatation of the pancreatic duct. Patient with previous history of pancreatitis. Consider MRCP or ERCP to further evaluate, as obstructing lesion causing pancreatic ductal dilatation is not excluded.   Electronically Signed   By: Esperanza Heiraymond  Rubner M.D.   On: 05/07/2014 23:51   Dg Chest 2 View  05/07/2014   CLINICAL DATA:  Multiple falls.  Concern for chest injury.  EXAM: CHEST  2 VIEW  COMPARISON:  Chest radiograph from 11/20/2012  FINDINGS: The lungs are well-aerated and clear. There is no evidence of focal opacification, pleural effusion or pneumothorax.  The heart is normal in size; the mediastinal contour is within normal limits. No acute osseous abnormalities are seen.  IMPRESSION: No acute cardiopulmonary process seen. No displaced rib fractures identified.   Electronically Signed   By: Roanna RaiderJeffery  Chang M.D.   On: 05/07/2014 22:17   Dg Cervical Spine Complete  05/07/2014   CLINICAL DATA:  Multiple falls.  Neck pain.  EXAM: CERVICAL SPINE  4+ VIEWS  COMPARISON:  MRI of the cervical spine performed 04/29/2008  FINDINGS: There is no evidence of fracture or subluxation. Vertebral bodies demonstrate normal height and alignment. Intervertebral disc spaces are preserved. Prevertebral soft tissues are within normal limits. The provided odontoid view demonstrates no significant abnormality.  The visualized lung apices are clear.  IMPRESSION: No evidence  of fracture or subluxation along the cervical spine.   Electronically Signed   By: Roanna RaiderJeffery  Chang M.D.   On:  05/07/2014 22:19   Ct Head Wo Contrast  05/07/2014   CLINICAL DATA:  Pain at the top of the head, radiating down the neck. Nausea, vomiting and diarrhea.  EXAM: CT HEAD WITHOUT CONTRAST  TECHNIQUE: Contiguous axial images were obtained from the base of the skull through the vertex without intravenous contrast.  COMPARISON:  CT of the head performed 10/21/2012  FINDINGS: There is no evidence of acute infarction, mass lesion, or intra- or extra-axial hemorrhage on CT.  Prominence of the sulci suggests mild cortical volume loss. Mild cerebellar atrophy is noted. Mild periventricular white matter change likely reflects small vessel ischemic microangiopathy.  The brainstem and fourth ventricle are within normal limits. The basal ganglia are unremarkable in appearance. The cerebral hemispheres demonstrate grossly normal gray-white differentiation. No mass effect or midline shift is seen.  There is no evidence of fracture; visualized osseous structures are unremarkable in appearance. The orbits are within normal limits. The paranasal sinuses and mastoid air cells are well-aerated. No significant soft tissue abnormalities are seen.  IMPRESSION: 1. No evidence of traumatic intracranial injury or fracture. 2. Mild cortical volume loss and scattered small vessel ischemic microangiopathy.   Electronically Signed   By: Roanna Raider M.D.   On: 05/07/2014 23:44    EKG: Independently reviewed. Sinus tach at 119  Assessment/Plan Principal Problem:   SIRS (systemic inflammatory response syndrome) - admit to SDU - leukocytosis, hypotension, tachycardia- etiology undetermined - CXR clear but may have underlying pneumonia which is not visible due to extereme dehydration-  due to her vomiting, she may have aspiration pneumonia - f/u blood cultures- UA negative - CT abd does not reveal any abdominal source of  infection - check stool studies for c dif and other pathogens - ER has ordered Vanc and Cefepime- I will change this to Flagyl and Levaquin which will broadly cover GI pathogens, CAP and aspiration pneumonia -  I have no suspicion of MRSA as this point but may need to add Vanc if symptoms persist  Active Problems: Syncope - suspect orthostatic hypotension- CT head negative-     Acute respiratory failure - hypoxia- etiology undetermined- possibly pneumonia?  -have given a dose of full dose Lovenox now-  would obtain a CT chest in AM as hypoxia with hypotension, syncope and tachycardia may be related to a PE - will need 6 hr prep with steroids and benadryl  ARF - hydrating aggressively- will give a 3rd liter of NS as a bolus and then 125 cc/hr.  - follow renal function after CT with IV contrast  Vomiting and diarrhea - check A1c - need to r/o gastroparesis in relation to DM -Call GI in AM- may need endoscopic exam -has chronic CBD dilatation and new dilatation of pancreatic duct on CT- also noted to have an atrophic pancreas  - may need MRCP    DIABETES MELLITUS, TYPE II with neuropathy -low dose insulin sliding scale- A1c    ASTHMA - she is no longer taking Spiriva (she states it causes vomiting??) - PRN Albuterol nebs    Crohn's disease - cont Sulfasalazine    Consulted: none  Code Status: Full code Family Communication: none  DVT Prophylaxis:Heparin  Time spent: >45  Calvert Cantor, MD [facility]  If 7PM-7AM, please contact night-coverage [email or web address] 05/08/2014, 12:39 AM

## 2014-05-07 NOTE — ED Notes (Signed)
Dr. Silverio Lay wants blood cultures drawn prior to starting antibiotics. Phlebotomy at bedside now.

## 2014-05-07 NOTE — ED Notes (Signed)
Patient presents to ED via [facility] from home. Patient c/o feeling "short of breath." Upon EMS arrival patient had "wheezes" throughout all fields. EMS administered an albuterol treatment and patient stated that it made her "feel sick to her stomach." Patient also c/o increased neck pain-denies any new injury/trauma. VSS. Pt is A&Ox4. No acute distress noted at this time.

## 2014-05-08 ENCOUNTER — Encounter (HOSPITAL_COMMUNITY): Payer: Self-pay | Admitting: Internal Medicine

## 2014-05-08 ENCOUNTER — Other Ambulatory Visit (HOSPITAL_COMMUNITY): Payer: Medicare Other

## 2014-05-08 ENCOUNTER — Inpatient Hospital Stay (HOSPITAL_COMMUNITY): Payer: Medicare Other

## 2014-05-08 DIAGNOSIS — R6520 Severe sepsis without septic shock: Secondary | ICD-10-CM

## 2014-05-08 DIAGNOSIS — R6510 Systemic inflammatory response syndrome (SIRS) of non-infectious origin without acute organ dysfunction: Secondary | ICD-10-CM | POA: Diagnosis present

## 2014-05-08 DIAGNOSIS — F411 Generalized anxiety disorder: Secondary | ICD-10-CM

## 2014-05-08 DIAGNOSIS — I10 Essential (primary) hypertension: Secondary | ICD-10-CM

## 2014-05-08 DIAGNOSIS — Z8719 Personal history of other diseases of the digestive system: Secondary | ICD-10-CM

## 2014-05-08 DIAGNOSIS — A419 Sepsis, unspecified organism: Secondary | ICD-10-CM

## 2014-05-08 DIAGNOSIS — R112 Nausea with vomiting, unspecified: Secondary | ICD-10-CM

## 2014-05-08 DIAGNOSIS — E119 Type 2 diabetes mellitus without complications: Secondary | ICD-10-CM

## 2014-05-08 DIAGNOSIS — R197 Diarrhea, unspecified: Secondary | ICD-10-CM

## 2014-05-08 DIAGNOSIS — Z720 Tobacco use: Secondary | ICD-10-CM | POA: Diagnosis present

## 2014-05-08 DIAGNOSIS — K5090 Crohn's disease, unspecified, without complications: Secondary | ICD-10-CM

## 2014-05-08 LAB — CBC
HCT: 31.2 % — ABNORMAL LOW (ref 36.0–46.0)
Hemoglobin: 10.3 g/dL — ABNORMAL LOW (ref 12.0–15.0)
MCH: 29.8 pg (ref 26.0–34.0)
MCHC: 33 g/dL (ref 30.0–36.0)
MCV: 90.2 fL (ref 78.0–100.0)
Platelets: 292 10*3/uL (ref 150–400)
RBC: 3.46 MIL/uL — AB (ref 3.87–5.11)
RDW: 17.3 % — ABNORMAL HIGH (ref 11.5–15.5)
WBC: 14.5 10*3/uL — AB (ref 4.0–10.5)

## 2014-05-08 LAB — COMPREHENSIVE METABOLIC PANEL
ALK PHOS: 183 U/L — AB (ref 39–117)
ALT: 23 U/L (ref 0–35)
AST: 27 U/L (ref 0–37)
Albumin: 2 g/dL — ABNORMAL LOW (ref 3.5–5.2)
Anion gap: 18 — ABNORMAL HIGH (ref 5–15)
BILIRUBIN TOTAL: 0.3 mg/dL (ref 0.3–1.2)
BUN: 16 mg/dL (ref 6–23)
CHLORIDE: 102 meq/L (ref 96–112)
CO2: 18 meq/L — AB (ref 19–32)
Calcium: 6.7 mg/dL — ABNORMAL LOW (ref 8.4–10.5)
Creatinine, Ser: 1.25 mg/dL — ABNORMAL HIGH (ref 0.50–1.10)
GFR calc Af Amer: 55 mL/min — ABNORMAL LOW (ref >90)
GFR, EST NON AFRICAN AMERICAN: 48 mL/min — AB (ref >90)
Glucose, Bld: 133 mg/dL — ABNORMAL HIGH (ref 70–99)
Potassium: 4.2 mEq/L (ref 3.7–5.3)
SODIUM: 138 meq/L (ref 137–147)
Total Protein: 4.9 g/dL — ABNORMAL LOW (ref 6.0–8.3)

## 2014-05-08 LAB — GLUCOSE, CAPILLARY
Glucose-Capillary: 161 mg/dL — ABNORMAL HIGH (ref 70–99)
Glucose-Capillary: 138 mg/dL — ABNORMAL HIGH (ref 70–99)
Glucose-Capillary: 113 mg/dL — ABNORMAL HIGH (ref 70–99)
Glucose-Capillary: 88 mg/dL (ref 70–99)
Glucose-Capillary: 85 mg/dL (ref 70–99)

## 2014-05-08 LAB — CLOSTRIDIUM DIFFICILE BY PCR: CDIFFPCR: NEGATIVE

## 2014-05-08 LAB — MRSA PCR SCREENING: MRSA by PCR: NEGATIVE

## 2014-05-08 LAB — HEMOGLOBIN A1C
HEMOGLOBIN A1C: 4.9 % (ref <5.7)
MEAN PLASMA GLUCOSE: 94 mg/dL (ref <117)

## 2014-05-08 MED ORDER — ALBUTEROL SULFATE (2.5 MG/3ML) 0.083% IN NEBU: 2.5000 mg | INHALATION_SOLUTION | RESPIRATORY_TRACT | Status: DC | PRN

## 2014-05-08 MED ORDER — GABAPENTIN 400 MG PO CAPS: 1200.0000 mg | ORAL_CAPSULE | Freq: Three times a day (TID) | ORAL | Status: DC

## 2014-05-08 MED ORDER — ONDANSETRON HCL 4 MG/2ML IJ SOLN
4.0000 mg | Freq: Four times a day (QID) | INTRAMUSCULAR | Status: DC | PRN
  Administered 2014-05-08: 4 mg via INTRAVENOUS
  Filled 2014-05-08 (×4): qty 2

## 2014-05-08 MED ORDER — TRAZODONE HCL 50 MG PO TABS
50.0000 mg | ORAL_TABLET | Freq: Every day | ORAL | Status: DC
  Administered 2014-05-08 – 2014-05-13 (×7): 50 mg via ORAL
  Filled 2014-05-08 (×8): qty 1

## 2014-05-08 MED ORDER — ALUM & MAG HYDROXIDE-SIMETH 200-200-20 MG/5ML PO SUSP: 30.0000 mL | Freq: Four times a day (QID) | ORAL | Status: DC | PRN

## 2014-05-08 MED ORDER — SULFASALAZINE 500 MG PO TABS
1000.0000 mg | ORAL_TABLET | Freq: Every day | ORAL | Status: DC
  Administered 2014-05-08 – 2014-05-14 (×7): 1000 mg via ORAL
  Filled 2014-05-08 (×9): qty 2

## 2014-05-08 MED ORDER — DIPHENHYDRAMINE HCL 50 MG/ML IJ SOLN
50.0000 mg | Freq: Once | INTRAMUSCULAR | Status: DC
  Filled 2014-05-08: qty 1

## 2014-05-08 MED ORDER — GABAPENTIN 400 MG PO CAPS
1500.0000 mg | ORAL_CAPSULE | Freq: Two times a day (BID) | ORAL | Status: DC
  Administered 2014-05-08 – 2014-05-13 (×11): 1500 mg via ORAL
  Filled 2014-05-08 (×15): qty 1

## 2014-05-08 MED ORDER — LEVOFLOXACIN IN D5W 750 MG/150ML IV SOLN
750.0000 mg | INTRAVENOUS | Status: DC
  Administered 2014-05-08 – 2014-05-10 (×2): 750 mg via INTRAVENOUS
  Filled 2014-05-08 (×2): qty 150

## 2014-05-08 MED ORDER — ENOXAPARIN SODIUM 40 MG/0.4ML ~~LOC~~ SOLN
40.0000 mg | Freq: Two times a day (BID) | SUBCUTANEOUS | Status: DC
  Administered 2014-05-08 – 2014-05-11 (×7): 40 mg via SUBCUTANEOUS
  Filled 2014-05-08 (×12): qty 0.4

## 2014-05-08 MED ORDER — ACETAMINOPHEN 650 MG RE SUPP: 650.0000 mg | Freq: Four times a day (QID) | RECTAL | Status: DC | PRN

## 2014-05-08 MED ORDER — ONDANSETRON HCL 4 MG PO TABS
4.0000 mg | ORAL_TABLET | Freq: Four times a day (QID) | ORAL | Status: DC | PRN
  Administered 2014-05-08: 4 mg via ORAL
  Filled 2014-05-08: qty 1

## 2014-05-08 MED ORDER — METHYLPREDNISOLONE SODIUM SUCC 125 MG IJ SOLR
125.0000 mg | Freq: Once | INTRAMUSCULAR | Status: AC
  Administered 2014-05-08: 125 mg via INTRAVENOUS
  Filled 2014-05-08: qty 2

## 2014-05-08 MED ORDER — AMITRIPTYLINE HCL 50 MG PO TABS
50.0000 mg | ORAL_TABLET | Freq: Every evening | ORAL | Status: DC | PRN
  Filled 2014-05-08: qty 1

## 2014-05-08 MED ORDER — SODIUM CHLORIDE 0.9 % IV BOLUS (SEPSIS)
1000.0000 mL | Freq: Once | INTRAVENOUS | Status: AC
  Administered 2014-05-08: 1000 mL via INTRAVENOUS

## 2014-05-08 MED ORDER — DIPHENHYDRAMINE HCL 50 MG/ML IJ SOLN
50.0000 mg | Freq: Once | INTRAMUSCULAR | Status: AC
  Administered 2014-05-08: 50 mg via INTRAVENOUS
  Filled 2014-05-08: qty 1

## 2014-05-08 MED ORDER — ALPRAZOLAM 0.5 MG PO TABS
2.0000 mg | ORAL_TABLET | Freq: Three times a day (TID) | ORAL | Status: DC | PRN
  Administered 2014-05-09 – 2014-05-13 (×10): 2 mg via ORAL
  Filled 2014-05-08 (×11): qty 4

## 2014-05-08 MED ORDER — VENLAFAXINE HCL 25 MG PO TABS
25.0000 mg | ORAL_TABLET | Freq: Two times a day (BID) | ORAL | Status: DC
  Administered 2014-05-08 – 2014-05-14 (×12): 25 mg via ORAL
  Filled 2014-05-08 (×18): qty 1

## 2014-05-08 MED ORDER — SODIUM CHLORIDE 0.9 % IV BOLUS (SEPSIS): 1000.0000 mL | INTRAVENOUS | Status: DC | PRN

## 2014-05-08 MED ORDER — HEPARIN SODIUM (PORCINE) 5000 UNIT/ML IJ SOLN
5000.0000 [IU] | Freq: Three times a day (TID) | INTRAMUSCULAR | Status: DC
  Administered 2014-05-08: 5000 [IU] via SUBCUTANEOUS
  Filled 2014-05-08 (×3): qty 1

## 2014-05-08 MED ORDER — DIPHENHYDRAMINE HCL 50 MG PO CAPS
50.0000 mg | ORAL_CAPSULE | Freq: Once | ORAL | Status: AC
  Administered 2014-05-08: 50 mg via ORAL
  Filled 2014-05-08: qty 1

## 2014-05-08 MED ORDER — METRONIDAZOLE IN NACL 5-0.79 MG/ML-% IV SOLN
500.0000 mg | Freq: Three times a day (TID) | INTRAVENOUS | Status: DC
  Administered 2014-05-08 – 2014-05-09 (×4): 500 mg via INTRAVENOUS
  Filled 2014-05-08 (×6): qty 100

## 2014-05-08 MED ORDER — CETYLPYRIDINIUM CHLORIDE 0.05 % MT LIQD
7.0000 mL | Freq: Two times a day (BID) | OROMUCOSAL | Status: DC
  Administered 2014-05-08 – 2014-05-14 (×11): 7 mL via OROMUCOSAL

## 2014-05-08 MED ORDER — FOLIC ACID 1 MG PO TABS
1.0000 mg | ORAL_TABLET | Freq: Every day | ORAL | Status: DC
  Administered 2014-05-08 – 2014-05-14 (×7): 1 mg via ORAL
  Filled 2014-05-08 (×7): qty 1

## 2014-05-08 MED ORDER — SODIUM CHLORIDE 0.9 % IV SOLN: INTRAVENOUS | Status: DC

## 2014-05-08 MED ORDER — LORATADINE 10 MG PO TABS
10.0000 mg | ORAL_TABLET | Freq: Every day | ORAL | Status: DC
  Administered 2014-05-08 – 2014-05-14 (×7): 10 mg via ORAL
  Filled 2014-05-08 (×7): qty 1

## 2014-05-08 MED ORDER — IOHEXOL 350 MG/ML SOLN
80.0000 mL | Freq: Once | INTRAVENOUS | Status: AC | PRN
  Administered 2014-05-08: 80 mL via INTRAVENOUS

## 2014-05-08 MED ORDER — HYDROCODONE-ACETAMINOPHEN 10-325 MG PO TABS
1.0000 | ORAL_TABLET | Freq: Four times a day (QID) | ORAL | Status: DC | PRN
  Administered 2014-05-09 – 2014-05-14 (×9): 1 via ORAL
  Filled 2014-05-08 (×10): qty 1

## 2014-05-08 MED ORDER — HYDROMORPHONE HCL 2 MG PO TABS
8.0000 mg | ORAL_TABLET | Freq: Two times a day (BID) | ORAL | Status: DC | PRN
  Administered 2014-05-08 – 2014-05-14 (×11): 8 mg via ORAL
  Filled 2014-05-08 (×14): qty 4

## 2014-05-08 MED ORDER — SODIUM CHLORIDE 0.9 % IV SOLN
INTRAVENOUS | Status: DC
  Administered 2014-05-08 (×2): via INTRAVENOUS
  Administered 2014-05-09: 1000 mL via INTRAVENOUS
  Administered 2014-05-09 – 2014-05-10 (×3): via INTRAVENOUS
  Administered 2014-05-10 – 2014-05-11 (×2): 1000 mL via INTRAVENOUS
  Administered 2014-05-12: 21:00:00 via INTRAVENOUS
  Administered 2014-05-13: 1000 mL via INTRAVENOUS

## 2014-05-08 MED ORDER — PROMETHAZINE HCL 25 MG/ML IJ SOLN
12.5000 mg | Freq: Four times a day (QID) | INTRAMUSCULAR | Status: DC | PRN
  Filled 2014-05-08: qty 1

## 2014-05-08 MED ORDER — ACETAMINOPHEN 325 MG PO TABS: 650.0000 mg | ORAL_TABLET | Freq: Four times a day (QID) | ORAL | Status: DC | PRN

## 2014-05-08 MED ORDER — INSULIN ASPART 100 UNIT/ML ~~LOC~~ SOLN
0.0000 [IU] | Freq: Every day | SUBCUTANEOUS | Status: DC
  Administered 2014-05-08: 3 [IU] via SUBCUTANEOUS

## 2014-05-08 MED ORDER — PANTOPRAZOLE SODIUM 40 MG IV SOLR
40.0000 mg | Freq: Every day | INTRAVENOUS | Status: DC
  Administered 2014-05-08 – 2014-05-12 (×6): 40 mg via INTRAVENOUS
  Filled 2014-05-08 (×7): qty 40

## 2014-05-08 MED ORDER — GABAPENTIN 600 MG PO TABS
1200.0000 mg | ORAL_TABLET | ORAL | Status: DC
  Administered 2014-05-08 – 2014-05-13 (×7): 1200 mg via ORAL
  Filled 2014-05-08 (×6): qty 2

## 2014-05-08 MED ORDER — ONDANSETRON HCL 4 MG/2ML IJ SOLN
4.0000 mg | Freq: Four times a day (QID) | INTRAMUSCULAR | Status: DC
  Administered 2014-05-08 – 2014-05-14 (×23): 4 mg via INTRAVENOUS
  Filled 2014-05-08 (×20): qty 2

## 2014-05-08 MED ORDER — INSULIN ASPART 100 UNIT/ML ~~LOC~~ SOLN
0.0000 [IU] | Freq: Three times a day (TID) | SUBCUTANEOUS | Status: DC
  Administered 2014-05-09: 1 [IU] via SUBCUTANEOUS
  Administered 2014-05-10: 2 [IU] via SUBCUTANEOUS

## 2014-05-08 MED ORDER — SODIUM CHLORIDE 0.9 % IJ SOLN
3.0000 mL | Freq: Two times a day (BID) | INTRAMUSCULAR | Status: DC
  Administered 2014-05-08 – 2014-05-14 (×11): 3 mL via INTRAVENOUS

## 2014-05-08 NOTE — Progress Notes (Signed)
Dr Maryelizabeth Rowan, Radiologist, informed RN of pt CT results of acute bilateral PE. A text message was sent by RN  to M. Burnadette Peter, midlevel [facility] hospitalist to inform of results and further orders. Will monitor

## 2014-05-08 NOTE — Progress Notes (Signed)
ANTICOAGULATION CONSULT NOTE - Initial Consult  Pharmacy Consult for Lovenox Indication: pulmonary embolus  Allergies  Allergen Reactions  . Iohexol Other (See Comments)    "severe burning" Patient has received Contrast in 2005 with 13 hour pre-medication, and had no reaction at that time  . Penicillins Hives    Patient Measurements: Height: 5\' 6"  (167.6 cm) Weight: 81 lb 12.7 oz (37.1 kg) IBW/kg (Calculated) : 59.3  Vital Signs: Temp: 98.7 F (37.1 C) (08/08 2011) Temp src: Oral (08/08 2011) BP: 150/135 mmHg (08/08 2011) Pulse Rate: 107 (08/08 2011)  Labs:  Recent Labs  05/07/14 2102 05/08/14 0406  HGB 12.3 10.3*  HCT 36.9 31.2*  PLT 348 292  CREATININE 1.34* 1.25*    Estimated Creatinine Clearance: 29.8 ml/min (by C-G formula based on Cr of 1.25).   Medical History: Past Medical History  Diagnosis Date  . Glaucoma   . Diabetes mellitus without complication   . Hypertension   . Neuropathy   . Crohn disease   . Asthma     Assessment: 53 year old female wit a history of pancreatitis and chronic alcohol abuse admitted with vomiting and diarrhea.  Found to have a PE on chest CT.  Very small lady with some renal insufficiency.  Pharmacy asked to begin Lovenox.  Goal of Therapy:  Anti-Xa level 0.6-1 units/ml 4hrs after LMWH dose given Monitor platelets by anticoagulation protocol: Yes   Plan:  1) Lovenox 40 mg sq Q 12 hours (treatment dose for her) 2) Follow up CBC and long-term plans for anti-coag  Thank you. Okey Regal, PharmD [phone]  Elwin Sleight 05/08/2014,10:07 PM

## 2014-05-08 NOTE — Progress Notes (Signed)
0600- Notified Erin, patient's nurse this morning, that patient will need a new peripheral IV #20g or larger in the forearm(above the wrist) for CTA.  Patient received Solu-medrol 125 mg @ 0155, she will need 50 mg Benadryl @ 0655 and new IV access for 0800 CT scan. (Time sensitive due to premedication for allergy)

## 2014-05-08 NOTE — Consult Note (Signed)
Consult performed for Dr. Loreta Ave and Dr. Elnoria Howard with [facility] ([facility] covering for weekend only) Patient seen, examined, and I agree with the above documentation, including the assessment and plan. Wait stool studies Hx of pancreatitis and chronic ETOH abuse putting her at risk for chronic pancreatitis.  Would recommend MRI pancreas + MRCP at some point before discharge to exclude lesion which could lead to PD dilation Empiric abx for now, no evidence of active Crohn's disease

## 2014-05-08 NOTE — Progress Notes (Signed)
At 0400 pt. BP in the 80s/60s with MAP of 65. Paged MD received order for bolus. After bolus pt. BP still in the 80s/60s. Pt. A&Ox4 and arousable. Paged MD again no new orders given.

## 2014-05-08 NOTE — Progress Notes (Addendum)
[facility] PROGRESS NOTE  [name] SEG:[id number] DOB: 1961/04/23 DOA: 05/07/2014 PCP: Benito Mccreedy, MD  Assessment/Plan: Vomiting and diarrhea  -await stool studies for c dif and other pathogens  -crohns related vs other infection  -I have consulted GI for further recs  new dilatation of pancreatic duct/ Chronic dilatation/Elevated alk Phos  -has chronic CBD dilatation and new dilatation of pancreatic duct on CT- also noted to have an atrophic pancreas -  ?MRCP vs ERCP -GI consulted for further recs SIRS (systemic inflammatory response syndrome)  - leukocytosis, hypotension, tachycardia- etiology undetermined  - CXR clear , follow and repeat CXR in am - f/u blood cultures- UA negative  - await stool studies for c dif and other pathogens  - continue current flagyl and levaquin pending studies  -could possibly be due to Crohns flare though CT abd neg -I have consulted GI for further recs. Syncope  - suspect orthostatic hypotension- CT head negative -check orthostatic vitals Acute respiratory failure  -hypoxia- etiology undetermined- possibly pneumonia?  -on full dose Lovenox for now- await CT chest angio to eval for PE - will need 6 hr prep with steroids and benadryl  ARF  -Improving with hydration -follow renal function after CT with IV contrast  DIABETES MELLITUS, TYPE II with neuropathy  -low dose insulin sliding scale- A1c  ASTHMA  - she is no longer taking Spiriva (she states it causes vomiting??)  - continue PRN Albuterol nebs  Crohn's disease  -cont Sulfasalazine  -GI consulted   Code Status: Full Family Communication: None at the bedside Disposition Plan: To home when medically   Consultants:  GI-awaiting eval  Procedures:  None  Antibiotics:  Levaquin started 8/7   Plan she'll started 8/7  HPI/Subjective: Complaining of nausea but no further vomiting, no diarrhea reported today so far  Objective: Filed Vitals:   05/08/14 1349   BP: 99/67  Pulse: 111  Temp: 99.4 F (37.4 C)  Resp: 21    Intake/Output Summary (Last 24 hours) at 05/08/14 1457 Last data filed at 05/08/14 1300  Gross per 24 hour  Intake 2764.58 ml  Output    300 ml  Net 2464.58 ml   Filed Weights   05/07/14 2002 05/08/14 0115  Weight: 37.649 kg (83 lb) 37.1 kg (81 lb 12.7 oz)    Exam:  General: alert & oriented x 3 In NAD Cardiovascular: RRR, nl S1 s2 Respiratory: CTAB Abdomen: soft +BS mild diffuse tenderness/ND, no masses palpable Extremities: No cyanosis and no edema    Data Reviewed: Basic Metabolic Panel:  Recent Labs Lab 05/07/14 2102 05/08/14 0406  NA 136* 138  K 4.5 4.2  CL 96 102  CO2 21 18*  GLUCOSE 135* 133*  BUN 15 16  CREATININE 1.34* 1.25*  CALCIUM 8.1* 6.7*   Liver Function Tests:  Recent Labs Lab 05/07/14 2102 05/08/14 0406  AST 32 27  ALT 31 23  ALKPHOS 219* 183*  BILITOT 0.4 0.3  PROT 6.0 4.9*  ALBUMIN 2.5* 2.0*    Recent Labs Lab 05/07/14 2102  LIPASE 11   No results found for this basename: AMMONIA,  in the last 168 hours CBC:  Recent Labs Lab 05/07/14 2102 05/08/14 0406  WBC 17.5* 14.5*  NEUTROABS 13.9*  --   HGB 12.3 10.3*  HCT 36.9 31.2*  MCV 90.9 90.2  PLT 348 292   Cardiac Enzymes: No results found for this basename: CKTOTAL, CKMB, CKMBINDEX, TROPONINI,  in the last 168 hours BNP (last 3 results) No  results found for this basename: PROBNP,  in the last 8760 hours CBG:  Recent Labs Lab 05/08/14 0147 05/08/14 0800 05/08/14 1229  GLUCAP 161* 138* 113*    Recent Results (from the past 240 hour(s))  MRSA PCR SCREENING     Status: None   Collection Time    05/08/14  1:06 AM      Result Value Ref Range Status   MRSA by PCR NEGATIVE  NEGATIVE Final   Comment:            The GeneXpert MRSA Assay (FDA     approved for NASAL specimens     only), is one component of a     comprehensive MRSA colonization     surveillance program. It is not     intended to  diagnose MRSA     infection nor to guide or     monitor treatment for     MRSA infections.     Studies: Ct Abdomen Pelvis Wo Contrast  05/07/2014   CLINICAL DATA:  Diffuse abdominal pain with nausea vomiting and diarrhea  EXAM: CT ABDOMEN AND PELVIS WITHOUT CONTRAST  TECHNIQUE: Multidetector CT imaging of the abdomen and pelvis was performed following the standard protocol without IV contrast.  COMPARISON:  07/30/2011 CT and 10/05/2013 ultrasound  FINDINGS: Visualized portions of the lung bases are clear.  Stable 2.5 cm low-attenuation lesion anterior right dome of the liver. 5 mm low-attenuation round lesion posterior right lobe of liver image number 22, not visible on the prior study. Gallbladder is mildly prominent, but stable in appearance. Common bile duct is significantly distended to about 1.5 cm, which is a stable finding.  Spleen is normal. Pancreatic duct is prominent. It measures up to about 6 mm, which is more prominent when compared to the prior study. The pancreas is otherwise normal except for moderate diffuse atrophy.  Adrenal glands are normal. There are a few punctate calcifications in both kidneys suggesting tiny nonobstructing calculi.  There is mild to moderate calcification of the abdominal aorta. There is no aortic dilatation identified. Bladder is relatively decompressed. Reproductive organs not clearly identified. No ascites.  There is a nonobstructive bowel gas pattern. Small and large bowel appear normal. Similar to prior study, there is some degree of gastric distention. Appendix is normal.  There are no acute musculoskeletal findings.  IMPRESSION: 1. Stable low-attenuation lesion in the dome of the liver, likely benign 2. Stable dilatation of the common bile duct. 3. Interval increase in the dilatation of the pancreatic duct. Patient with previous history of pancreatitis. Consider MRCP or ERCP to further evaluate, as obstructing lesion causing pancreatic ductal dilatation is not  excluded.   Electronically Signed   By: Skipper Cliche M.D.   On: 05/07/2014 23:51   Dg Chest 2 View  05/07/2014   CLINICAL DATA:  Multiple falls.  Concern for chest injury.  EXAM: CHEST  2 VIEW  COMPARISON:  Chest radiograph from 11/20/2012  FINDINGS: The lungs are well-aerated and clear. There is no evidence of focal opacification, pleural effusion or pneumothorax.  The heart is normal in size; the mediastinal contour is within normal limits. No acute osseous abnormalities are seen.  IMPRESSION: No acute cardiopulmonary process seen. No displaced rib fractures identified.   Electronically Signed   By: Garald Balding M.D.   On: 05/07/2014 22:17   Dg Cervical Spine Complete  05/07/2014   CLINICAL DATA:  Multiple falls.  Neck pain.  EXAM: CERVICAL SPINE  4+ VIEWS  COMPARISON:  MRI of the cervical spine performed 04/29/2008  FINDINGS: There is no evidence of fracture or subluxation. Vertebral bodies demonstrate normal height and alignment. Intervertebral disc spaces are preserved. Prevertebral soft tissues are within normal limits. The provided odontoid view demonstrates no significant abnormality.  The visualized lung apices are clear.  IMPRESSION: No evidence of fracture or subluxation along the cervical spine.   Electronically Signed   By: Garald Balding M.D.   On: 05/07/2014 22:19   Ct Head Wo Contrast  05/07/2014   CLINICAL DATA:  Pain at the top of the head, radiating down the neck. Nausea, vomiting and diarrhea.  EXAM: CT HEAD WITHOUT CONTRAST  TECHNIQUE: Contiguous axial images were obtained from the base of the skull through the vertex without intravenous contrast.  COMPARISON:  CT of the head performed 10/21/2012  FINDINGS: There is no evidence of acute infarction, mass lesion, or intra- or extra-axial hemorrhage on CT.  Prominence of the sulci suggests mild cortical volume loss. Mild cerebellar atrophy is noted. Mild periventricular white matter change likely reflects small vessel ischemic  microangiopathy.  The brainstem and fourth ventricle are within normal limits. The basal ganglia are unremarkable in appearance. The cerebral hemispheres demonstrate grossly normal gray-white differentiation. No mass effect or midline shift is seen.  There is no evidence of fracture; visualized osseous structures are unremarkable in appearance. The orbits are within normal limits. The paranasal sinuses and mastoid air cells are well-aerated. No significant soft tissue abnormalities are seen.  IMPRESSION: 1. No evidence of traumatic intracranial injury or fracture. 2. Mild cortical volume loss and scattered small vessel ischemic microangiopathy.   Electronically Signed   By: Garald Balding M.D.   On: 05/07/2014 23:44    Scheduled Meds: . antiseptic oral rinse  7 mL Mouth Rinse BID  . folic acid  1 mg Oral Daily  . gabapentin  1,500 mg Oral BID   And  . gabapentin  1,200 mg Oral Q24H  . heparin  5,000 Units Subcutaneous 3 times per day  . insulin aspart  0-5 Units Subcutaneous QHS  . insulin aspart  0-9 Units Subcutaneous TID WC  . levofloxacin (LEVAQUIN) IV  750 mg Intravenous Q48H  . loratadine  10 mg Oral Daily  . metronidazole  500 mg Intravenous Q8H  . ondansetron (ZOFRAN) IV  4 mg Intravenous Q6H  . pantoprazole (PROTONIX) IV  40 mg Intravenous QHS  . sodium chloride  3 mL Intravenous Q12H  . sulfaSALAzine  1,000 mg Oral Q breakfast  . traZODone  50 mg Oral QHS  . venlafaxine  25 mg Oral BID WC   Continuous Infusions: . sodium chloride 125 mL/hr at 05/08/14 0153    Principal Problem:   SIRS (systemic inflammatory response syndrome) Active Problems:   DIABETES MELLITUS, TYPE II   ASTHMA   Acute respiratory failure   Crohn's disease   Nicotine abuse    Time spent:35    [facility] Pager [phone]. If 7PM-7AM, please contact night-coverage at [email or web address], password [facility] 05/08/2014, 2:57 PM  LOS: 1 day

## 2014-05-08 NOTE — Progress Notes (Signed)
Patient arrived to CT for CTA, Peripheral IV is infiltrated- Paged IV team- They will meet patient on the floor to attempt PIV access for CT

## 2014-05-08 NOTE — Consult Note (Signed)
Referring Provider: [facility] Physician:  Jackie Plum, MD Primary Gastroenterologist:  Dr.Johnson remote- none current  Reason for Consultation:  Nausea/vomiting, diarrhea, abnormal Ct with dilated pancreatic duct  HPI: [name] is a 53 y.o. female with multiple medical problems who was admitted through the emergency room last evening after a syncopal episode at home. According to the patient she had been sick at home over the past 5 days with nausea vomiting and diarrhea having about 5-6 loose bowel movements per day which were nonbloody. She was having difficulty keeping down by mouth this and complained of increased weakness. She also felt some shortness of breath yesterday. She says she has passed out a couple of times this week. She had was unaware of any fever or chills at home. She is a difficult and somewhat scattered historian today, she does have history of chronic pain which in part may be chronic abdominal pain she also has neuropathy. She generally has Vicodin to take at home but also has a lot of at home and says she has been taking that allotted over the past 2 weeks. She cannot recall when she last took an antibiotic. She has prior history of EtOH abuse but denies any current EtOH use. She continued to take her blood pressure medicines while she was ill when having vomiting. She has a diagnosis of Crohn's disease which was proctocolitis by Dr. Henriette Combs prior reports which was documented in 2004, she had reevaluation in 2008 and there was no evidence of active disease. She is maintained on sulfasalazine. Patient also has history of adult onset diabetes mellitus, hepatitis C, prior EtOH-induced pancreatitis, prior admission with sepsis secondary to pneumonia, smoker, and anxiety and depression for which she is on several medications. Patient felt to have surgery/sepsis on admission source unclear and was hypotensive on arrival with blood pressure in the 80s. Chest  x-ray is negative, CT of the head was negative, and CT of the abdomen and pelvis without IV contrast showed a common bile duct of 1.5 cm which is stable dilated pancreatic duct of 6 mm slightly more prominent in pancreatic atrophy there were no acute intra-abdominal findings. D-dimer was elevated and CT and GI angio. is pending. WBC was elevated at 17.5 down 14.5 today LFTs normal with the exception of a total bili of 0.3 and alkaline phosphatase of 183, lipase 11 troponin unremarkable. Interestingly patient has not had any nausea vomiting or diarrhea since admission but says that we would give her something to eat she will have diarrhea. She seems somewhat scattered and jittery which is concerning for withdrawal a and a and and and chew 80 and and and and will and and in her not sure 2 inches and ago sxs.   Past Medical History  Diagnosis Date  . Glaucoma   . Diabetes mellitus without complication   . Hypertension   . Neuropathy   . Crohn disease   . Asthma     Past Surgical History  Procedure Laterality Date  . Cesarean section      Prior to Admission medications   Medication Sig Start Date End Date Taking? Authorizing Provider  alprazolam Prudy Feeler) 2 MG tablet Take 2 mg by mouth 3 (three) times daily as needed for anxiety.   Yes Historical Provider, MD  amitriptyline (ELAVIL) 50 MG tablet Take 1 tablet (50 mg total) by mouth daily. 10/24/12  Yes Alison Murray, MD  cetirizine (ZYRTEC) 10 MG tablet Take 10 mg by mouth every morning.  Yes Historical Provider, MD  folic acid (FOLVITE) 1 MG tablet Take 1 tablet (1 mg total) by mouth daily. 10/24/12  Yes Alison Murray, MD  gabapentin (NEURONTIN) 300 MG capsule Take 1,200-1,500 mg by mouth 3 (three) times daily.    Yes Historical Provider, MD  glimepiride (AMARYL) 2 MG tablet Take 2 mg by mouth daily as needed (diet).    Yes Historical Provider, MD  HYDROcodone-acetaminophen (NORCO) 10-325 MG per tablet Take 1 tablet by mouth every 6 (six)  hours as needed for moderate pain.   Yes Historical Provider, MD  HYDROmorphone (DILAUDID) 8 MG tablet Take 8 mg by mouth 2 (two) times daily as needed for severe pain.   Yes Historical Provider, MD  lisinopril (PRINIVIL,ZESTRIL) 5 MG tablet Take 5 mg by mouth at bedtime.    Yes Historical Provider, MD  meclizine (ANTIVERT) 25 MG tablet Take 25 mg by mouth 3 (three) times daily as needed for dizziness.    Yes Historical Provider, MD  methocarbamol (ROBAXIN) 750 MG tablet Take 1 tablet (750 mg total) by mouth 2 (two) times daily as needed. For muscle spasms 10/24/12  Yes Alison Murray, MD  ondansetron (ZOFRAN) 4 MG tablet Take 4 mg by mouth every 8 (eight) hours as needed for nausea. 10/05/13  Yes Roxy Horseman, PA-C  pantoprazole (PROTONIX) 20 MG tablet Take 40 mg by mouth 2 (two) times daily.   Yes Historical Provider, MD  polyethylene glycol (MIRALAX / GLYCOLAX) packet Take 17 g by mouth daily.   Yes Historical Provider, MD  promethazine (PHENERGAN) 25 MG tablet Take 25 mg by mouth 2 (two) times daily as needed. For nausea   Yes Historical Provider, MD  simvastatin (ZOCOR) 40 MG tablet Take 40 mg by mouth at bedtime.    Yes Historical Provider, MD  sulfaSALAzine (AZULFIDINE) 500 MG tablet Take 1,000 mg by mouth daily.    Yes Historical Provider, MD  thiamine 100 MG tablet Take 1 tablet (100 mg total) by mouth daily. 10/24/12  Yes Alison Murray, MD  tiotropium (SPIRIVA) 18 MCG inhalation capsule Place 18 mcg into inhaler and inhale daily.   Yes Historical Provider, MD  traZODone (DESYREL) 50 MG tablet Take 50 mg by mouth daily.     Yes Historical Provider, MD  venlafaxine (EFFEXOR) 25 MG tablet Take 25 mg by mouth 2 (two) times daily.   Yes Historical Provider, MD    Current Facility-Administered Medications  Medication Dose Route Frequency Provider Last Rate Last Dose  . 0.9 %  sodium chloride infusion   Intravenous Continuous Calvert Cantor, MD 125 mL/hr at 05/08/14 0153    . acetaminophen  (TYLENOL) tablet 650 mg  650 mg Oral Q6H PRN Calvert Cantor, MD       Or  . acetaminophen (TYLENOL) suppository 650 mg  650 mg Rectal Q6H PRN Calvert Cantor, MD      . albuterol (PROVENTIL) (2.5 MG/3ML) 0.083% nebulizer solution 2.5 mg  2.5 mg Nebulization Q4H PRN Calvert Cantor, MD      . ALPRAZolam Prudy Feeler) tablet 2 mg  2 mg Oral TID PRN Calvert Cantor, MD      . alum & mag hydroxide-simeth (MAALOX/MYLANTA) 200-200-20 MG/5ML suspension 30 mL  30 mL Oral Q6H PRN Calvert Cantor, MD      . amitriptyline (ELAVIL) tablet 50 mg  50 mg Oral QHS PRN Calvert Cantor, MD      . antiseptic oral rinse (CPC / CETYLPYRIDINIUM CHLORIDE 0.05%) solution 7 mL  7 mL  Mouth Rinse BID Calvert Cantor, MD      . folic acid (FOLVITE) tablet 1 mg  1 mg Oral Daily Calvert Cantor, MD   1 mg at 05/08/14 1047  . gabapentin (NEURONTIN) capsule 1,500 mg  1,500 mg Oral BID Calvert Cantor, MD   1,500 mg at 05/08/14 1047   And  . gabapentin (NEURONTIN) tablet 1,200 mg  1,200 mg Oral Q24H Saima Rizwan, MD      . heparin injection 5,000 Units  5,000 Units Subcutaneous 3 times per day Calvert Cantor, MD      . HYDROcodone-acetaminophen (NORCO) 10-325 MG per tablet 1 tablet  1 tablet Oral Q6H PRN Calvert Cantor, MD      . HYDROmorphone (DILAUDID) tablet 8 mg  8 mg Oral BID PRN Calvert Cantor, MD      . insulin aspart (novoLOG) injection 0-5 Units  0-5 Units Subcutaneous QHS Saima Rizwan, MD      . insulin aspart (novoLOG) injection 0-9 Units  0-9 Units Subcutaneous TID WC Saima Rizwan, MD      . levofloxacin (LEVAQUIN) IVPB 750 mg  750 mg Intravenous Q48H Abran Duke, RPH   750 mg at 05/08/14 0330  . loratadine (CLARITIN) tablet 10 mg  10 mg Oral Daily Calvert Cantor, MD   10 mg at 05/08/14 1047  . metroNIDAZOLE (FLAGYL) IVPB 500 mg  500 mg Intravenous Q8H Calvert Cantor, MD   500 mg at 05/08/14 0153  . ondansetron (ZOFRAN) tablet 4 mg  4 mg Oral Q6H PRN Calvert Cantor, MD       Or  . ondansetron (ZOFRAN) injection 4 mg  4 mg Intravenous Q6H PRN Calvert Cantor,  MD   4 mg at 05/08/14 1100  . ondansetron (ZOFRAN) injection 4 mg  4 mg Intravenous Q6H Calvert Cantor, MD   4 mg at 05/08/14 0636  . pantoprazole (PROTONIX) injection 40 mg  40 mg Intravenous QHS Calvert Cantor, MD   40 mg at 05/08/14 0153  . promethazine (PHENERGAN) injection 12.5 mg  12.5 mg Intravenous Q6H PRN Saima Rizwan, MD      . sodium chloride 0.9 % bolus 1,000 mL  1,000 mL Intravenous PRN Calvert Cantor, MD      . sodium chloride 0.9 % injection 3 mL  3 mL Intravenous Q12H Saima Rizwan, MD      . sulfaSALAzine (AZULFIDINE) tablet 1,000 mg  1,000 mg Oral Q breakfast Calvert Cantor, MD   1,000 mg at 05/08/14 1047  . traZODone (DESYREL) tablet 50 mg  50 mg Oral QHS Calvert Cantor, MD   50 mg at 05/08/14 0154  . venlafaxine (EFFEXOR) tablet 25 mg  25 mg Oral BID WC Calvert Cantor, MD   25 mg at 05/08/14 1047    Allergies as of 05/07/2014 - Review Complete 05/07/2014  Allergen Reaction Noted  . Iohexol Other (See Comments) 11/01/2003  . Penicillins Hives 05/12/2007    No family history on file.  History   Social History  . Marital Status: Divorced    Spouse Name: N/A    Number of Children: N/A  . Years of Education: N/A   Occupational History  . Not on file.   Social History Main Topics  . Smoking status: Current Every Day Smoker  . Smokeless tobacco: Not on file  . Alcohol Use: Yes  . Drug Use: No  . Sexual Activity: Not on file   Other Topics Concern  . Not on file   Social History Narrative  . No narrative  on file    Review of Systems: Pertinent positive and negative review of systems were noted in the above HPI section.  All other review of systems was otherwise negative.n.  Physical Exam: Vital signs in last 24 hours: Temp:  [97.3 F (36.3 C)-99.4 F (37.4 C)] 99.4 F (37.4 C) (08/08 1349) Pulse Rate:  [33-119] 111 (08/08 1349) Resp:  [11-26] 21 (08/08 1349) BP: (80-144)/(60-101) 99/67 mmHg (08/08 1349) SpO2:  [92 %-100 %] 100 % (08/08 1349) Weight:  [81 lb  12.7 oz (37.1 kg)-83 lb (37.649 kg)] 81 lb 12.7 oz (37.1 kg) (08/08 0115)   General:   Alert,  Well-developed, thin chronically ill appearing AA female  pleasant and cooperative in NAD, jittery Head:  Normocephalic and atraumatic. Eyes:  Sclera clear, no icterus.   Conjunctiva pink. Ears:  Normal auditory acuity. Nose:  No deformity, discharge,  or lesions. Mouth:  No deformity or lesions.   Neck:  Supple; no masses or thyromegaly. Lungs:  Clear throughout to auscultation.   No wheezes, crackles, or rhonchi. Heart: tachy Regular rate and rhythm; no murmurs, clicks, rubs,  or gallops. Abdomen:  Soft,nontender, BS active,nonpalp mass or hsm.   Rectal:  Deferred  Msk:  Symmetrical without gross deformities. . Pulses:  Normal pulses noted. Extremities:  Without clubbing or edema. Neurologic:  Alert and  oriented x3  grossly normal neurologically.-jittery, twitching Skin:  Intact without significant lesions or rashes.. Psych:  Alert and cooperative. Anxious,difficulty with train of thought Intake/Output from previous day: 08/07 0701 - 08/08 0700 In: 2764.6 [I.V.:514.6; IV Piggyback:2250] Out: -  Intake/Output this shift: Total I/O In: -  Out: 300 [Urine:300]  Lab Results:  Recent Labs  05/07/14 2102 05/08/14 0406  WBC 17.5* 14.5*  HGB 12.3 10.3*  HCT 36.9 31.2*  PLT 348 292   BMET  Recent Labs  05/07/14 2102 05/08/14 0406  NA 136* 138  K 4.5 4.2  CL 96 102  CO2 21 18*  GLUCOSE 135* 133*  BUN 15 16  CREATININE 1.34* 1.25*  CALCIUM 8.1* 6.7*   LFT  Recent Labs  05/08/14 0406  PROT 4.9*  ALBUMIN 2.0*  AST 27  ALT 23  ALKPHOS 183*  BILITOT 0.3   PT/INR No results found for this basename: LABPROT, INR,  in the last 72 hours Hepatitis Panel No results found for this basename: HEPBSAG, HCVAB, HEPAIGM, HEPBIGM,  in the last 72 hours   IMPRESSION:  #[id number]  53 year old AA female admitted with weakness and syncope in setting of a five-day history of nausea vomiting  and diarrhea. He patient hypotensive on admission with leukocytosis and therefore concerns for underlying sepsis. She has history of Crohn's was is been in active for many years and no evidence of CT for active Crohn's. She may have an infectious gastroenteritis, rule out C. difficile etc. #2 polypharmacy with chronic narcotic use-concerned that she may be having withdrawal symptoms currently #3 atrophic pancreas and dilated common bile duct in patient with prior history of EtOH-induced pancreatitis. Suspect she has an underlying chronic pancreatitis. May eventually need MRCP/MRI #4 adult onset diabetes mellitus #5 asthma #6 chronic anxiety and depression    PLAN: #1 advance to full liquid diet #2 Obtain GI pathogen panel #3 continue Levaquin and metronidazole for now #4 narcotic withdrawal management per [facility] #5 MRI/MRCP does not need to be done today but would obtain prior to discharge.   Kelvyn Schunk  05/08/2014, 2:37 PM

## 2014-05-09 ENCOUNTER — Inpatient Hospital Stay (HOSPITAL_COMMUNITY): Payer: Medicare Other

## 2014-05-09 DIAGNOSIS — J9600 Acute respiratory failure, unspecified whether with hypoxia or hypercapnia: Secondary | ICD-10-CM

## 2014-05-09 DIAGNOSIS — I2699 Other pulmonary embolism without acute cor pulmonale: Principal | ICD-10-CM

## 2014-05-09 DIAGNOSIS — R55 Syncope and collapse: Secondary | ICD-10-CM

## 2014-05-09 LAB — URINE CULTURE
CULTURE: NO GROWTH
Colony Count: NO GROWTH

## 2014-05-09 LAB — BASIC METABOLIC PANEL
Anion gap: 13 (ref 5–15)
BUN: 9 mg/dL (ref 6–23)
CALCIUM: 5.2 mg/dL — AB (ref 8.4–10.5)
CHLORIDE: 109 meq/L (ref 96–112)
CO2: 16 meq/L — AB (ref 19–32)
Creatinine, Ser: 0.73 mg/dL (ref 0.50–1.10)
GFR calc Af Amer: >90 mL/min (ref >90)
GFR calc non Af Amer: >90 mL/min (ref >90)
GLUCOSE: 78 mg/dL (ref 70–99)
Potassium: 3.7 mEq/L (ref 3.7–5.3)
SODIUM: 138 meq/L (ref 137–147)

## 2014-05-09 LAB — CBC
HEMATOCRIT: 27.9 % — AB (ref 36.0–46.0)
Hemoglobin: 9 g/dL — ABNORMAL LOW (ref 12.0–15.0)
MCH: 30.1 pg (ref 26.0–34.0)
MCHC: 32.3 g/dL (ref 30.0–36.0)
MCV: 93.3 fL (ref 78.0–100.0)
Platelets: 306 10*3/uL (ref 150–400)
RBC: 2.99 MIL/uL — ABNORMAL LOW (ref 3.87–5.11)
RDW: 17.8 % — AB (ref 11.5–15.5)
WBC: 15.6 10*3/uL — ABNORMAL HIGH (ref 4.0–10.5)

## 2014-05-09 LAB — GLUCOSE, CAPILLARY
GLUCOSE-CAPILLARY: 57 mg/dL — AB (ref 70–99)
GLUCOSE-CAPILLARY: 131 mg/dL — AB (ref 70–99)
GLUCOSE-CAPILLARY: 114 mg/dL — AB (ref 70–99)
GLUCOSE-CAPILLARY: 151 mg/dL — AB (ref 70–99)
Glucose-Capillary: 118 mg/dL — ABNORMAL HIGH (ref 70–99)
Glucose-Capillary: 96 mg/dL (ref 70–99)

## 2014-05-09 MED ORDER — ENSURE PUDDING PO PUDG
1.0000 | Freq: Three times a day (TID) | ORAL | Status: DC
  Administered 2014-05-09 – 2014-05-14 (×10): 1 via ORAL

## 2014-05-09 MED ORDER — SODIUM CHLORIDE 0.9 % IV BOLUS (SEPSIS)
1000.0000 mL | Freq: Once | INTRAVENOUS | Status: AC
  Administered 2014-05-09: 1000 mL via INTRAVENOUS

## 2014-05-09 NOTE — Progress Notes (Signed)
Progress Note for Dr. Mann/Dr. Elnoria HowardHung   Subjective  Pt feels better today Hungry One loose BM, but diarrhea better No n/v Dx with b/l PEs on lovenox Diet advanced C diff negative   Objective  Vital signs in last 24 hours: Temp:  [97.8 F (36.6 C)-99.4 F (37.4 C)] 97.8 F (36.6 C) (08/09 1201) Pulse Rate:  [33-114] 114 (08/09 1201) Resp:  [12-21] 17 (08/09 1201) BP: (76-150)/(57-135) 124/84 mmHg (08/09 1201) SpO2:  [85 %-100 %] 90 % (08/09 1201) Weight:  [88 lb 1.6 oz (39.962 kg)] 88 lb 1.6 oz (39.962 kg) (08/09 0527)   Gen: awake, alert, NAD HEENT: anicteric, op clear CV: mild tachy, regular, no mrg Pulm: CTA b/l Abd: soft, thin NT/ND, +BS throughout Ext: no c/c/e Neuro: nonfocal   Intake/Output from previous day: 08/08 0701 - 08/09 0700 In: 125 [I.V.:125] Out: 600 [Urine:600] Intake/Output this shift: Total I/O In: 588 [P.O.:360; I.V.:128; IV Piggyback:100] Out: 500 [Urine:500]  Lab Results:  Recent Labs  05/07/14 2102 05/08/14 0406 05/09/14 0339  WBC 17.5* 14.5* 15.6*  HGB 12.3 10.3* 9.0*  HCT 36.9 31.2* 27.9*  PLT 348 292 306   BMET  Recent Labs  05/07/14 2102 05/08/14 0406 05/09/14 0700  NA 136* 138 138  K 4.5 4.2 3.7  CL 96 102 109  CO2 21 18* 16*  GLUCOSE 135* 133* 78  BUN 15 16 9   CREATININE 1.34* 1.25* 0.73  CALCIUM 8.1* 6.7* 5.2*   LFT  Recent Labs  05/08/14 0406  PROT 4.9*  ALBUMIN 2.0*  AST 27  ALT 23  ALKPHOS 183*  BILITOT 0.3    Studies/Results: Ct Abdomen Pelvis Wo Contrast  05/07/2014   CLINICAL DATA:  Diffuse abdominal pain with nausea vomiting and diarrhea  EXAM: CT ABDOMEN AND PELVIS WITHOUT CONTRAST  TECHNIQUE: Multidetector CT imaging of the abdomen and pelvis was performed following the standard protocol without IV contrast.  COMPARISON:  07/30/2011 CT and 10/05/2013 ultrasound  FINDINGS: Visualized portions of the lung bases are clear.  Stable 2.5 cm low-attenuation lesion anterior right dome of the liver. 5  mm low-attenuation round lesion posterior right lobe of liver image number 22, not visible on the prior study. Gallbladder is mildly prominent, but stable in appearance. Common bile duct is significantly distended to about 1.5 cm, which is a stable finding.  Spleen is normal. Pancreatic duct is prominent. It measures up to about 6 mm, which is more prominent when compared to the prior study. The pancreas is otherwise normal except for moderate diffuse atrophy.  Adrenal glands are normal. There are a few punctate calcifications in both kidneys suggesting tiny nonobstructing calculi.  There is mild to moderate calcification of the abdominal aorta. There is no aortic dilatation identified. Bladder is relatively decompressed. Reproductive organs not clearly identified. No ascites.  There is a nonobstructive bowel gas pattern. Small and large bowel appear normal. Similar to prior study, there is some degree of gastric distention. Appendix is normal.  There are no acute musculoskeletal findings.  IMPRESSION: 1. Stable low-attenuation lesion in the dome of the liver, likely benign 2. Stable dilatation of the common bile duct. 3. Interval increase in the dilatation of the pancreatic duct. Patient with previous history of pancreatitis. Consider MRCP or ERCP to further evaluate, as obstructing lesion causing pancreatic ductal dilatation is not excluded.   Electronically Signed   By: Esperanza Heiraymond  Rubner M.D.   On: 05/07/2014 23:51   Dg Chest 2 View  05/09/2014  CLINICAL DATA:  53 year old female with pulmonary emboli. Respiratory difficulty.  EXAM: CHEST  2 VIEW  COMPARISON:  05/07/2014 chest radiograph.  FINDINGS: This is a low volume film with mild bibasilar atelectasis.  Cardiomediastinal silhouette is unremarkable.  There is no evidence of focal airspace disease, pulmonary edema, suspicious pulmonary nodule/mass, pleural effusion, or pneumothorax. No acute bony abnormalities are identified.  IMPRESSION: Low volume film with  mild bibasilar atelectasis.   Electronically Signed   By: Laveda Abbe M.D.   On: 05/09/2014 08:29   Dg Chest 2 View  05/07/2014   CLINICAL DATA:  Multiple falls.  Concern for chest injury.  EXAM: CHEST  2 VIEW  COMPARISON:  Chest radiograph from 11/20/2012  FINDINGS: The lungs are well-aerated and clear. There is no evidence of focal opacification, pleural effusion or pneumothorax.  The heart is normal in size; the mediastinal contour is within normal limits. No acute osseous abnormalities are seen.  IMPRESSION: No acute cardiopulmonary process seen. No displaced rib fractures identified.   Electronically Signed   By: Roanna Raider M.D.   On: 05/07/2014 22:17   Dg Cervical Spine Complete  05/07/2014   CLINICAL DATA:  Multiple falls.  Neck pain.  EXAM: CERVICAL SPINE  4+ VIEWS  COMPARISON:  MRI of the cervical spine performed 04/29/2008  FINDINGS: There is no evidence of fracture or subluxation. Vertebral bodies demonstrate normal height and alignment. Intervertebral disc spaces are preserved. Prevertebral soft tissues are within normal limits. The provided odontoid view demonstrates no significant abnormality.  The visualized lung apices are clear.  IMPRESSION: No evidence of fracture or subluxation along the cervical spine.   Electronically Signed   By: Roanna Raider M.D.   On: 05/07/2014 22:19   Ct Head Wo Contrast  05/07/2014   CLINICAL DATA:  Pain at the top of the head, radiating down the neck. Nausea, vomiting and diarrhea.  EXAM: CT HEAD WITHOUT CONTRAST  TECHNIQUE: Contiguous axial images were obtained from the base of the skull through the vertex without intravenous contrast.  COMPARISON:  CT of the head performed 10/21/2012  FINDINGS: There is no evidence of acute infarction, mass lesion, or intra- or extra-axial hemorrhage on CT.  Prominence of the sulci suggests mild cortical volume loss. Mild cerebellar atrophy is noted. Mild periventricular white matter change likely reflects small vessel ischemic  microangiopathy.  The brainstem and fourth ventricle are within normal limits. The basal ganglia are unremarkable in appearance. The cerebral hemispheres demonstrate grossly normal gray-white differentiation. No mass effect or midline shift is seen.  There is no evidence of fracture; visualized osseous structures are unremarkable in appearance. The orbits are within normal limits. The paranasal sinuses and mastoid air cells are well-aerated. No significant soft tissue abnormalities are seen.  IMPRESSION: 1. No evidence of traumatic intracranial injury or fracture. 2. Mild cortical volume loss and scattered small vessel ischemic microangiopathy.   Electronically Signed   By: Roanna Raider M.D.   On: 05/07/2014 23:44   Ct Angio Chest Pe W/cm &/or Wo Cm  05/08/2014   CLINICAL DATA:  Shortness of breath  EXAM: CT ANGIOGRAPHY CHEST WITH CONTRAST  TECHNIQUE: Multidetector CT imaging of the chest was performed using the standard protocol during bolus administration of intravenous contrast. Multiplanar CT image reconstructions and MIPs were obtained to evaluate the vascular anatomy.  CONTRAST:  80mL OMNIPAQUE IOHEXOL 350 MG/ML SOLN  COMPARISON:  Chest radiograph 05/07/2014: CT scan abdomen pelvis 05/07/2014  FINDINGS: There are bilateral filling defects in the pulmonary  arterial system. There is a small volume of thrombus with in the left lower lobe pulmonary arterial system involving a few tertiary branches. Thrombosis is not occlusive. On the right, there are also several filling defects and lower lobe pulmonary arterial branches which are not occlusive.  There is mild bilateral lower lobe atelectasis. There is no significant pleural or pericardial effusion. There is no significant hilar or mediastinal adenopathy. There is calcification of the left anterior descending coronary artery.  Esophagus is distended and filled with fluid. No acute musculoskeletal findings involving the thorax. Low-attenuation lesion right dome  of the liver as described on 05/07/2014 CT abdomen.  Review of the MIP images confirms the above findings.  IMPRESSION: Acute nonobstructive bilateral pulmonary emboli. Critical Value/emergent results were called by telephone at the time of interpretation on 05/08/2014 at 10:00 pm Vinnie Langton, the patient's nurse, who verbally acknowledged these results and indicated she would relay this information to the patient's referring physician.   Electronically Signed   By: Esperanza Heir M.D.   On: 05/08/2014 22:00      Assessment & Plan  53 year old female admitted with weakness and syncope, 5 days of nausea vomiting or diarrhea, found to have bilateral pulmonary emboli now on therapeutic Lovenox  1. N/V/diarrhea -- largely improved. C. difficile negative. Followup GI pathogen panel. Patient is now hungry and wanting to eat regular diet. No endoscopic GI procedures planned  2.  History of Crohn's disease -- no evidence of active Crohn's disease by imaging. Not felt to be a current active issue.  3.  Bilateral PE -- on therapeutic Lovenox, further evaluation per primary team  4.  Atrophic pancreas/dilated PD -- recommend MRI/MRCP, which can be done before discharge or even as an outpatient, but I would recommend followup imaging to better define/explain this subtle abnormality  GI will sign off for now, please call GI (Dr. Lance Coon. [address]) with any further questions/issues  Principal Problem:   SIRS (systemic inflammatory response syndrome) Active Problems:   DIABETES MELLITUS, TYPE II   ASTHMA   Acute respiratory failure   Crohn's disease   Nicotine abuse     LOS: 2 days   Forever Arechiga M  05/09/2014, 1:09 PM

## 2014-05-09 NOTE — Progress Notes (Signed)
[facility] PROGRESS NOTE  [name] AYO:[id number] DOB: [birth year] DOA: 05/07/2014 PCP: Benito Mccreedy, MD  Assessment/Plan: Vomiting and diarrhea  -c dif neg, GI pathogen panel still pending  -per GI, no evidence of active crohns related - Clinically improved, advance diet as Tol to regular  new dilatation of pancreatic duct/ Chronic dilatation/Elevated alk Phos  -has chronic CBD dilatation and new dilatation of pancreatic duct on CT- also noted to have an atrophic pancreas - -GI recommending MRI/MRCP prior to d/c, follow  SIRS (systemic inflammatory response syndrome)  - leukocytosis, hypotension, tachycardia- etiology undetermined  - CXR clear , follow and repeat CXR in am - f/u blood cultures- UA negative  - c dif neg and per GI as above no evidence of active crohns -leukocytosis, likely d/t steroids( received prior to PE), follow PE -CT angio with bil PEs, continue therapeutic lovenox -discussed oral anticoagulation options and she specifically wants Eliquis -will keep on LOVENOX for now as pt still tachy and hypotensive, follow and transition when clinically appropriate   Syncope  - suspect orthostatic hypotension- CT head negative -hypotensive this am 8/9, will bolus and follow - likely d/t PE, treatment as above>>follow  Acute respiratory failure  -hypoxia- likely d/t PE -on full dose Lovenox for now- as above  ARF  -resolved with hydration -follow renal function after CT with IV contrast   DIABETES MELLITUS, TYPE II with neuropathy  -low dose insulin sliding scale- A1c nl at 4.9   ASTHMA  - she is no longer taking Spiriva (she states it causes vomiting??)  - continue PRN Albuterol nebs  Crohn's disease  -cont Sulfasalazine  -GI consulted   Code Status: Full Family Communication: None at the bedside Disposition Plan: To home when medically   Consultants:  GI-awaiting eval  Procedures:  None  Antibiotics:  Levaquin started 8/7    Plan she'll started 8/7  HPI/Subjective: States no further nausea or vomiting. Requesting a regular diet. No diarrhea reported today  Objective: Filed Vitals:   05/09/14 0900  BP: 95/68  Pulse: 33  Temp:   Resp: 12    Intake/Output Summary (Last 24 hours) at 05/09/14 1017 Last data filed at 05/09/14 0929  Gross per 24 hour  Intake    353 ml  Output    600 ml  Net   -247 ml   Filed Weights   05/07/14 2002 05/08/14 0115 05/09/14 0527  Weight: 37.649 kg (83 lb) 37.1 kg (81 lb 12.7 oz) 39.962 kg (88 lb 1.6 oz)    Exam:  General: alert & oriented x 3 In NAD Cardiovascular: RRR, nl S1 s2 Respiratory: CTAB Abdomen: soft +BS NT/ND, no masses palpable Extremities: No cyanosis and no edema    Data Reviewed: Basic Metabolic Panel:  Recent Labs Lab 05/07/14 2102 05/08/14 0406 05/09/14 0700  NA 136* 138 138  K 4.5 4.2 3.7  CL 96 102 109  CO2 21 18* 16*  GLUCOSE 135* 133* 78  BUN 15 16 9   CREATININE 1.34* 1.25* 0.73  CALCIUM 8.1* 6.7* 5.2*   Liver Function Tests:  Recent Labs Lab 05/07/14 2102 05/08/14 0406  AST 32 27  ALT 31 23  ALKPHOS 219* 183*  BILITOT 0.4 0.3  PROT 6.0 4.9*  ALBUMIN 2.5* 2.0*    Recent Labs Lab 05/07/14 2102  LIPASE 11   No results found for this basename: AMMONIA,  in the last 168 hours CBC:  Recent Labs Lab 05/07/14 2102 05/08/14 0406 05/09/14 0339  WBC 17.5* 14.5* 15.6*  NEUTROABS 13.9*  --   --   HGB 12.3 10.3* 9.0*  HCT 36.9 31.2* 27.9*  MCV 90.9 90.2 93.3  PLT 348 292 306   Cardiac Enzymes: No results found for this basename: CKTOTAL, CKMB, CKMBINDEX, TROPONINI,  in the last 168 hours BNP (last 3 results) No results found for this basename: PROBNP,  in the last 8760 hours CBG:  Recent Labs Lab 05/08/14 1710 05/08/14 2202 05/09/14 0027 05/09/14 0827 05/09/14 1008  GLUCAP 88 85 96 57* 118*    Recent Results (from the past 240 hour(s))  URINE CULTURE     Status: None   Collection Time    05/07/14  11:13 PM      Result Value Ref Range Status   Specimen Description URINE, CATHETERIZED   Final   Special Requests NONE   Final   Culture  Setup Time     Final   Value: 05/08/2014 00:46     Performed at [facility]     Final   Value: NO GROWTH     Performed at [facility]   Culture     Final   Value: NO GROWTH     Performed at [facility]   Report Status 05/09/2014 FINAL   Final  MRSA PCR SCREENING     Status: None   Collection Time    05/08/14  1:06 AM      Result Value Ref Range Status   MRSA by PCR NEGATIVE  NEGATIVE Final   Comment:            The GeneXpert MRSA Assay (FDA     approved for NASAL specimens     only), is one component of a     comprehensive MRSA colonization     surveillance program. It is not     intended to diagnose MRSA     infection nor to guide or     monitor treatment for     MRSA infections.  CLOSTRIDIUM DIFFICILE BY PCR     Status: None   Collection Time    05/08/14  4:19 PM      Result Value Ref Range Status   C difficile by pcr NEGATIVE  NEGATIVE Final     Studies: Ct Abdomen Pelvis Wo Contrast  05/07/2014   CLINICAL DATA:  Diffuse abdominal pain with nausea vomiting and diarrhea  EXAM: CT ABDOMEN AND PELVIS WITHOUT CONTRAST  TECHNIQUE: Multidetector CT imaging of the abdomen and pelvis was performed following the standard protocol without IV contrast.  COMPARISON:  07/30/2011 CT and 10/05/2013 ultrasound  FINDINGS: Visualized portions of the lung bases are clear.  Stable 2.5 cm low-attenuation lesion anterior right dome of the liver. 5 mm low-attenuation round lesion posterior right lobe of liver image number 22, not visible on the prior study. Gallbladder is mildly prominent, but stable in appearance. Common bile duct is significantly distended to about 1.5 cm, which is a stable finding.  Spleen is normal. Pancreatic duct is prominent. It measures up to about 6 mm, which is more prominent when compared to the  prior study. The pancreas is otherwise normal except for moderate diffuse atrophy.  Adrenal glands are normal. There are a few punctate calcifications in both kidneys suggesting tiny nonobstructing calculi.  There is mild to moderate calcification of the abdominal aorta. There is no aortic dilatation identified. Bladder is relatively decompressed. Reproductive organs not clearly identified. No ascites.  There is a nonobstructive bowel  gas pattern. Small and large bowel appear normal. Similar to prior study, there is some degree of gastric distention. Appendix is normal.  There are no acute musculoskeletal findings.  IMPRESSION: 1. Stable low-attenuation lesion in the dome of the liver, likely benign 2. Stable dilatation of the common bile duct. 3. Interval increase in the dilatation of the pancreatic duct. Patient with previous history of pancreatitis. Consider MRCP or ERCP to further evaluate, as obstructing lesion causing pancreatic ductal dilatation is not excluded.   Electronically Signed   By: Skipper Cliche M.D.   On: 05/07/2014 23:51   Dg Chest 2 View  05/09/2014   CLINICAL DATA:  53 year old female with pulmonary emboli. Respiratory difficulty.  EXAM: CHEST  2 VIEW  COMPARISON:  05/07/2014 chest radiograph.  FINDINGS: This is a low volume film with mild bibasilar atelectasis.  Cardiomediastinal silhouette is unremarkable.  There is no evidence of focal airspace disease, pulmonary edema, suspicious pulmonary nodule/mass, pleural effusion, or pneumothorax. No acute bony abnormalities are identified.  IMPRESSION: Low volume film with mild bibasilar atelectasis.   Electronically Signed   By: Hassan Rowan M.D.   On: 05/09/2014 08:29   Dg Chest 2 View  05/07/2014   CLINICAL DATA:  Multiple falls.  Concern for chest injury.  EXAM: CHEST  2 VIEW  COMPARISON:  Chest radiograph from 11/20/2012  FINDINGS: The lungs are well-aerated and clear. There is no evidence of focal opacification, pleural effusion or  pneumothorax.  The heart is normal in size; the mediastinal contour is within normal limits. No acute osseous abnormalities are seen.  IMPRESSION: No acute cardiopulmonary process seen. No displaced rib fractures identified.   Electronically Signed   By: Garald Balding M.D.   On: 05/07/2014 22:17   Dg Cervical Spine Complete  05/07/2014   CLINICAL DATA:  Multiple falls.  Neck pain.  EXAM: CERVICAL SPINE  4+ VIEWS  COMPARISON:  MRI of the cervical spine performed 04/29/2008  FINDINGS: There is no evidence of fracture or subluxation. Vertebral bodies demonstrate normal height and alignment. Intervertebral disc spaces are preserved. Prevertebral soft tissues are within normal limits. The provided odontoid view demonstrates no significant abnormality.  The visualized lung apices are clear.  IMPRESSION: No evidence of fracture or subluxation along the cervical spine.   Electronically Signed   By: Garald Balding M.D.   On: 05/07/2014 22:19   Ct Head Wo Contrast  05/07/2014   CLINICAL DATA:  Pain at the top of the head, radiating down the neck. Nausea, vomiting and diarrhea.  EXAM: CT HEAD WITHOUT CONTRAST  TECHNIQUE: Contiguous axial images were obtained from the base of the skull through the vertex without intravenous contrast.  COMPARISON:  CT of the head performed 10/21/2012  FINDINGS: There is no evidence of acute infarction, mass lesion, or intra- or extra-axial hemorrhage on CT.  Prominence of the sulci suggests mild cortical volume loss. Mild cerebellar atrophy is noted. Mild periventricular white matter change likely reflects small vessel ischemic microangiopathy.  The brainstem and fourth ventricle are within normal limits. The basal ganglia are unremarkable in appearance. The cerebral hemispheres demonstrate grossly normal gray-white differentiation. No mass effect or midline shift is seen.  There is no evidence of fracture; visualized osseous structures are unremarkable in appearance. The orbits are within  normal limits. The paranasal sinuses and mastoid air cells are well-aerated. No significant soft tissue abnormalities are seen.  IMPRESSION: 1. No evidence of traumatic intracranial injury or fracture. 2. Mild cortical volume loss and scattered  small vessel ischemic microangiopathy.   Electronically Signed   By: Garald Balding M.D.   On: 05/07/2014 23:44   Ct Angio Chest Pe W/cm &/or Wo Cm  05/08/2014   CLINICAL DATA:  Shortness of breath  EXAM: CT ANGIOGRAPHY CHEST WITH CONTRAST  TECHNIQUE: Multidetector CT imaging of the chest was performed using the standard protocol during bolus administration of intravenous contrast. Multiplanar CT image reconstructions and MIPs were obtained to evaluate the vascular anatomy.  CONTRAST:  65m OMNIPAQUE IOHEXOL 350 MG/ML SOLN  COMPARISON:  Chest radiograph 05/07/2014: CT scan abdomen pelvis 05/07/2014  FINDINGS: There are bilateral filling defects in the pulmonary arterial system. There is a small volume of thrombus with in the left lower lobe pulmonary arterial system involving a few tertiary branches. Thrombosis is not occlusive. On the right, there are also several filling defects and lower lobe pulmonary arterial branches which are not occlusive.  There is mild bilateral lower lobe atelectasis. There is no significant pleural or pericardial effusion. There is no significant hilar or mediastinal adenopathy. There is calcification of the left anterior descending coronary artery.  Esophagus is distended and filled with fluid. No acute musculoskeletal findings involving the thorax. Low-attenuation lesion right dome of the liver as described on 05/07/2014 CT abdomen.  Review of the MIP images confirms the above findings.  IMPRESSION: Acute nonobstructive bilateral pulmonary emboli. Critical Value/emergent results were called by telephone at the time of interpretation on 05/08/2014 at 10:00 pm GElzie Rings the patient's nurse, who verbally acknowledged these results and indicated she  would relay this information to the patient's referring physician.   Electronically Signed   By: RSkipper ClicheM.D.   On: 05/08/2014 22:00    Scheduled Meds: . antiseptic oral rinse  7 mL Mouth Rinse BID  . enoxaparin (LOVENOX) injection  40 mg Subcutaneous Q12H  . folic acid  1 mg Oral Daily  . gabapentin  1,500 mg Oral BID   And  . gabapentin  1,200 mg Oral Q24H  . insulin aspart  0-5 Units Subcutaneous QHS  . insulin aspart  0-9 Units Subcutaneous TID WC  . levofloxacin (LEVAQUIN) IV  750 mg Intravenous Q48H  . loratadine  10 mg Oral Daily  . metronidazole  500 mg Intravenous Q8H  . ondansetron (ZOFRAN) IV  4 mg Intravenous Q6H  . pantoprazole (PROTONIX) IV  40 mg Intravenous QHS  . sodium chloride  1,000 mL Intravenous Once  . sodium chloride  3 mL Intravenous Q12H  . sulfaSALAzine  1,000 mg Oral Q breakfast  . traZODone  50 mg Oral QHS  . venlafaxine  25 mg Oral BID WC   Continuous Infusions: . sodium chloride 125 mL/hr at 05/09/14 03009   Principal Problem:   SIRS (systemic inflammatory response syndrome) Active Problems:   DIABETES MELLITUS, TYPE II   ASTHMA   Acute respiratory failure   Crohn's disease   Nicotine abuse    Time spent:35    [facility] Pager [phone] If 7PM-7AM, please contact night-coverage at [email or web address], password [email or web address]8/06/2014, 10:17 AM  LOS: 2 days

## 2014-05-09 NOTE — Progress Notes (Signed)
Patient's CBG was 57 mg/dl.  Rechecked after breakfast 118 mg/dl

## 2014-05-09 NOTE — Progress Notes (Signed)
CRITICAL VALUE ALERT  Critical value received:  Calcium 5.2  Date of notification:  05/09/2014   Time of notification:  10:53 AM   Critical value read back:yes  Nurse who received alert: Gerald Dexterata Sahmya Arai, RN  MD notified (1st page):  Dr. Suanne MarkerViyuoh  Time of first page: 10:53 AM   MD notified (2nd page):  Time of second page:  Responding MD:    Time MD responded:

## 2014-05-09 NOTE — Progress Notes (Signed)
CRITICAL VALUE ALERT  Critical value received:  CBG 57 mg/dl  Date of notification:  05/09/2014   Time of notification:  10:55 AM   Critical value read back:yes  Nurse who received alert:  Gerald Dexter, RN  MD notified (1st page):  Dr. Suanne Marker  Time of first page:  10:56 AM   MD notified (2nd page):  Time of second page:  Responding MD:    Time MD responded:

## 2014-05-09 NOTE — Progress Notes (Signed)
INITIAL NUTRITION ASSESSMENT  DOCUMENTATION CODES Per approved criteria  -Severe malnutrition in the context of chronic illness   INTERVENTION: 1.  General healthful diet; encourage intake of foods and beverages as able.  RD to follow and assess for nutritional adequacy. MD in with patient to discuss diet advancement to solid foods.  2.  Supplements; Ensure Pudding po BID, each supplement provides 170 kcal and 4 grams of protein  NUTRITION DIAGNOSIS: Inadequate oral intake   Monitor:  1.  Food/Beverage; pt meeting >/=90% estimated needs with tolerance. 2.  Wt/wt change; monitor trends  Reason for Assessment: MST  53 y.o. female  Admitting Dx: SIRS (systemic inflammatory response syndrome)  ASSESSMENT: Pt admitted with episodes of syncope at home.  Pt stated she has been passing out frequently at home.  Pt also has PMHx significant for chronic pancreatitis, EtOH abuse, vomiting, and loose stools for months with recent worsening.  Pt found to have bilateral PEs. Pt with recent wt loss.  Review of chart shows weight hx of 95-100 lbs, now down to 88 lbs.  Pt lives at home with family.   Pt is requesting solid food.  Pt states that she has a very "finicky" stomach and appetite.  She can only eat when "my brain and stomach say it is ok."  Pt reports that she takes Phenegran at home to help with nausea and without it, she cannot tolerate food.  Pt's dietary recall is variable and she admits there are days when she cannot eat without vomiting.  When she does have "good days" pt states she will eat chips and dip (as one day's calorie intake) or have fried fish take-out small platter which lasts her 3 days.   RD will attempt to follow up with pt as interview was not completed due to MD needing to assess pt.   Pt meets criteria for severe MALNUTRITION in the context of chronic illness as evidenced by PO intake insufficient to meet 75% of estimated needs and recent wt loss of 10.5% in 3-6  months.  Height: Ht Readings from Last 1 Encounters:  05/08/14 5\' 6"  (1.676 m)    Weight: Wt Readings from Last 1 Encounters:  05/09/14 88 lb 1.6 oz (39.962 kg)    Ideal Body Weight: 130 lbs  % Ideal Body Weight: 67%  Wt Readings from Last 10 Encounters:  05/09/14 88 lb 1.6 oz (39.962 kg)  10/05/13 92 lb (41.731 kg)  10/20/12 102 lb 15.3 oz (46.7 kg)  07/30/11 94 lb 15.9 oz (43.09 kg)    Usual Body Weight: 100 lbs  % Usual Body Weight: 88%  BMI:  Body mass index is 14.23 kg/(m^2).  Estimated Nutritional Needs: Kcal: 1680-1800 Protein: 80-88g Fluid: >1.8 L/day  Skin: no issues noted  Diet Order: Full Liquid  EDUCATION NEEDS: -No education needs identified at this time   Intake/Output Summary (Last 24 hours) at 05/09/14 1000 Last data filed at 05/09/14 0929  Gross per 24 hour  Intake    353 ml  Output    600 ml  Net   -247 ml    Last BM: PTA   Labs:   Recent Labs Lab 05/07/14 2102 05/08/14 0406 05/09/14 0700  NA 136* 138 138  K 4.5 4.2 3.7  CL 96 102 109  CO2 21 18* 16*  BUN 15 16 9   CREATININE 1.34* 1.25* 0.73  CALCIUM 8.1* 6.7* 5.2*  GLUCOSE 135* 133* 78    CBG (last 3)   Recent Labs  05/08/14 2202 05/09/14 0027 05/09/14 0827  GLUCAP 85 96 57*    Scheduled Meds: . antiseptic oral rinse  7 mL Mouth Rinse BID  . enoxaparin (LOVENOX) injection  40 mg Subcutaneous Q12H  . folic acid  1 mg Oral Daily  . gabapentin  1,500 mg Oral BID   And  . gabapentin  1,200 mg Oral Q24H  . insulin aspart  0-5 Units Subcutaneous QHS  . insulin aspart  0-9 Units Subcutaneous TID WC  . levofloxacin (LEVAQUIN) IV  750 mg Intravenous Q48H  . loratadine  10 mg Oral Daily  . metronidazole  500 mg Intravenous Q8H  . ondansetron (ZOFRAN) IV  4 mg Intravenous Q6H  . pantoprazole (PROTONIX) IV  40 mg Intravenous QHS  . sodium chloride  3 mL Intravenous Q12H  . sulfaSALAzine  1,000 mg Oral Q breakfast  . traZODone  50 mg Oral QHS  . venlafaxine  25 mg  Oral BID WC    Continuous Infusions: . sodium chloride 125 mL/hr at 05/09/14 6606    Past Medical History  Diagnosis Date  . Glaucoma   . Diabetes mellitus without complication   . Hypertension   . Neuropathy   . Crohn disease   . Asthma     Past Surgical History  Procedure Laterality Date  . Cesarean section      Loyce Dys, MS RD LDN Clinical Inpatient Dietitian Weekend/After hours pager: [phone]

## 2014-05-10 DIAGNOSIS — F329 Major depressive disorder, single episode, unspecified: Secondary | ICD-10-CM

## 2014-05-10 DIAGNOSIS — F3289 Other specified depressive episodes: Secondary | ICD-10-CM

## 2014-05-10 LAB — GI PATHOGEN PANEL BY PCR, STOOL
C difficile toxin A/B: NEGATIVE
Campylobacter by PCR: NEGATIVE
Cryptosporidium by PCR: NEGATIVE
E coli (ETEC) LT/ST: NEGATIVE
E coli (STEC): NEGATIVE
E coli 0157 by PCR: NEGATIVE
G LAMBLIA BY PCR: NEGATIVE
Norovirus GI/GII: NEGATIVE
Rotavirus A by PCR: NEGATIVE
SALMONELLA BY PCR: NEGATIVE
SHIGELLA BY PCR: NEGATIVE

## 2014-05-10 LAB — GLUCOSE, CAPILLARY
GLUCOSE-CAPILLARY: 75 mg/dL (ref 70–99)
GLUCOSE-CAPILLARY: 60 mg/dL — AB (ref 70–99)
Glucose-Capillary: 76 mg/dL (ref 70–99)
Glucose-Capillary: 152 mg/dL — ABNORMAL HIGH (ref 70–99)

## 2014-05-10 LAB — BASIC METABOLIC PANEL
Anion gap: 12 (ref 5–15)
BUN: 5 mg/dL — ABNORMAL LOW (ref 6–23)
CO2: 15 meq/L — AB (ref 19–32)
CREATININE: 0.68 mg/dL (ref 0.50–1.10)
Calcium: 5.2 mg/dL — CL (ref 8.4–10.5)
Chloride: 117 mEq/L — ABNORMAL HIGH (ref 96–112)
GFR calc Af Amer: >90 mL/min (ref >90)
GFR calc non Af Amer: >90 mL/min (ref >90)
GLUCOSE: 72 mg/dL (ref 70–99)
Potassium: 3.7 mEq/L (ref 3.7–5.3)
SODIUM: 144 meq/L (ref 137–147)

## 2014-05-10 MED ORDER — SODIUM CHLORIDE 0.9 % IV BOLUS (SEPSIS)
1000.0000 mL | Freq: Once | INTRAVENOUS | Status: AC
  Administered 2014-05-10: 1000 mL via INTRAVENOUS

## 2014-05-10 MED ORDER — LOPERAMIDE HCL 2 MG PO CAPS
4.0000 mg | ORAL_CAPSULE | Freq: Once | ORAL | Status: AC
  Administered 2014-05-10: 4 mg via ORAL
  Filled 2014-05-10: qty 2

## 2014-05-10 NOTE — Clinical Documentation Improvement (Signed)
  Please review Register Dietician Initial Assessment dated 05/09/14 at 10:00 AM by Loyce Dys  INITIAL NUTRITION ASSESSMENT  DOCUMENTATION CODES  Per approved criteria   -Severe malnutrition in the context of chronic illness   INTERVENTION:  1. General healthful diet; encourage intake of foods and beverages as able. RD to follow and assess for nutritional adequacy. MD in with patient to discuss diet advancement to solid foods.  2. Supplements; Ensure Pudding po BID, each supplement provides 170 kcal and 4 grams of protein  NUTRITION DIAGNOSIS:  Inadequate oral intake  Monitor:  1. Food/Beverage; pt meeting >/=90% estimated needs with tolerance.  2. Wt/wt change; monitor trends Pt meets criteria for severe MALNUTRITION in the context of chronic illness as evidenced by PO intake insufficient to meet 75% of estimated needs and recent wt loss of 10.5% in 3-6 months.  BMI: Body mass index is 14.23 kg/(m^2).  Possible Clinical Conditions:   - Severe Protein Calorie Malnutrition   - Other Acuity and Type of Malnutrition   - Unable to Clinically Determine    Thank You, Jerral Ralph ,RN BSN CCDS Certified Clinical Documentation Specialist:  [phone] [facility]- Health Information Management

## 2014-05-10 NOTE — Progress Notes (Signed)
[facility] PROGRESS NOTE  [name] MRN:[id number] DOB: 07/08/1961 DOA: 05/07/2014 PCP: OSEI-BONSU,GEORGE, MD  Assessment/Plan: Vomiting and diarrhea  -c dif neg, GI pathogen panel still pending  -per GI, no evidence of active crohns  - Clinically improved, tolerating advanced diet  new dilatation of pancreatic duct/ Chronic dilatation/Elevated alk Phos  -has chronic CBD dilatation and new dilatation of pancreatic duct on CT- also noted to have an atrophic pancreas - -GI recommending MRI/MRCP prior to d/c, follow  SIRS (systemic inflammatory response syndrome)  - leukocytosis, hypotension, tachycardia- etiology undetermined  - CXR clear , follow and repeat CXR in am - f/u blood cultures- UA negative  - c dif neg and per GI as above no evidence of active crohns -leukocytosis, likely d/t steroids( received prior to PE), follow -Patient still with hypotension, likely due to PE see as discussed below PE -CT angio with bil PEs, continue therapeutic lovenox -discussed oral anticoagulation options and she specifically wants Eliquis -will keep on LOVENOX for now as pt still tachy and hypotensive, follow and transition when clinically appropriate   Syncope  - suspect orthostatic hypotension- CT head negative -Still hypotensive, will again bolus and follow - likely d/t PE, continue therapeutic dose Lovenox for now and follow  Acute respiratory failure  -hypoxia- likely d/t PE -on full dose Lovenox for now- as above  ARF  -resolved with hydration   History of DIABETES MELLITUS, TYPE II with neuropathy  -low dose insulin sliding scale- A1c nl at 4.9   ASTHMA  - she is no longer taking Spiriva (she states it causes vomiting??)  - continue PRN Albuterol nebs  Crohn's disease  -cont Sulfasalazine  -GI consulted   Code Status: Full Family Communication: None at the bedside Disposition Plan: To home when medically   Consultants:  GI-awaiting  eval  Procedures:  None  Antibiotics:  Levaquin started 8/7   Flagyl started 8/7>>8/9  HPI/Subjective: tolerating by mouth well, States breathing better. Reports BM x2-the one this a.m. was more formed. Objective: Filed Vitals:   05/10/14 0816  BP: 95/77  Pulse: 109  Temp: 97.6 F (36.4 C)  Resp: 23    Intake/Output Summary (Last 24 hours) at 05/10/14 0907 Last data filed at 05/10/14 0800  Gross per 24 hour  Intake   2588 ml  Output    800 ml  Net   1788 ml   Filed Weights   05/07/14 2002 05/08/14 0115 05/09/14 0527  Weight: 37.649 kg (83 lb) 37.1 kg (81 lb 12.7 oz) 39.962 kg (88 lb 1.6 oz)    Exam:  General: alert & oriented x 3 In NAD Cardiovascular: RRR, nl S1 s2 Respiratory: CTAB Abdomen: soft +BS NT/ND, no masses palpable Extremities: No cyanosis and no edema    Data Reviewed: Basic Metabolic Panel:  Recent Labs Lab 05/07/14 2102 05/08/14 0406 05/09/14 0700  NA 136* 138 138  K 4.5 4.2 3.7  CL 96 102 109  CO2 21 18* 16*  GLUCOSE 135* 133* 78  BUN 15 16 9  CREATININE 1.34* 1.25* 0.73  CALCIUM 8.1* 6.7* 5.2*   Liver Function Tests:  Recent Labs Lab 05/07/14 2102 05/08/14 0406  AST 32 27  ALT 31 23  ALKPHOS 219* 183*  BILITOT 0.4 0.3  PROT 6.0 4.9*  ALBUMIN 2.5* 2.0*    Recent Labs Lab 05/07/14 2102  LIPASE 11   No results found for this basename: AMMONIA,  in the last 168 hours CBC:  Recent Labs Lab 05/07/14 2102   05/08/14 0406 05/09/14 0339  WBC 17.5* 14.5* 15.6*  NEUTROABS 13.9*  --   --   HGB 12.3 10.3* 9.0*  HCT 36.9 31.2* 27.9*  MCV 90.9 90.2 93.3  PLT 348 292 306   Cardiac Enzymes: No results found for this basename: CKTOTAL, CKMB, CKMBINDEX, TROPONINI,  in the last 168 hours BNP (last 3 results) No results found for this basename: PROBNP,  in the last 8760 hours CBG:  Recent Labs Lab 05/09/14 1008 05/09/14 1207 05/09/14 1617 05/09/14 2215 05/10/14 0818  GLUCAP 118* 131* 114* 151* 76    Recent  Results (from the past 240 hour(s))  URINE CULTURE     Status: None   Collection Time    05/07/14 11:13 PM      Result Value Ref Range Status   Specimen Description URINE, CATHETERIZED   Final   Special Requests NONE   Final   Culture  Setup Time     Final   Value: 05/08/2014 00:46     Performed at [facility]   Colony Count     Final   Value: NO GROWTH     Performed at [facility]   Culture     Final   Value: NO GROWTH     Performed at [facility]   Report Status 05/09/2014 FINAL   Final  CULTURE, BLOOD (ROUTINE X 2)     Status: None   Collection Time    05/08/14 12:25 AM      Result Value Ref Range Status   Specimen Description BLOOD RIGHT FOREARM   Final   Special Requests BOTTLES DRAWN AEROBIC AND ANAEROBIC 5CC EACH   Final   Culture  Setup Time     Final   Value: 05/08/2014 11:23     Performed at [facility]   Culture     Final   Value:        BLOOD CULTURE RECEIVED NO GROWTH TO DATE CULTURE WILL BE HELD FOR 5 DAYS BEFORE ISSUING A FINAL NEGATIVE REPORT     Performed at [facility]   Report Status PENDING   Incomplete  CULTURE, BLOOD (ROUTINE X 2)     Status: None   Collection Time    05/08/14 12:35 AM      Result Value Ref Range Status   Specimen Description BLOOD LEFT FOREARM   Final   Special Requests BOTTLES DRAWN AEROBIC ONLY 3CC   Final   Culture  Setup Time     Final   Value: 05/08/2014 11:24     Performed at [facility]   Culture     Final   Value:        BLOOD CULTURE RECEIVED NO GROWTH TO DATE CULTURE WILL BE HELD FOR 5 DAYS BEFORE ISSUING A FINAL NEGATIVE REPORT     Performed at [facility]   Report Status PENDING   Incomplete  MRSA PCR SCREENING     Status: None   Collection Time    05/08/14  1:06 AM      Result Value Ref Range Status   MRSA by PCR NEGATIVE  NEGATIVE Final   Comment:            The GeneXpert MRSA Assay (FDA     approved for NASAL specimens     only), is one component of  a     comprehensive MRSA colonization     surveillance program. It is not       intended to diagnose MRSA     infection nor to guide or     monitor treatment for     MRSA infections.  CLOSTRIDIUM DIFFICILE BY PCR     Status: None   Collection Time    05/08/14  4:19 PM      Result Value Ref Range Status   C difficile by pcr NEGATIVE  NEGATIVE Final     Studies: Dg Chest 2 View  05/09/2014   CLINICAL DATA:  53 year old female with pulmonary emboli. Respiratory difficulty.  EXAM: CHEST  2 VIEW  COMPARISON:  05/07/2014 chest radiograph.  FINDINGS: This is a low volume film with mild bibasilar atelectasis.  Cardiomediastinal silhouette is unremarkable.  There is no evidence of focal airspace disease, pulmonary edema, suspicious pulmonary nodule/mass, pleural effusion, or pneumothorax. No acute bony abnormalities are identified.  IMPRESSION: Low volume film with mild bibasilar atelectasis.   Electronically Signed   By: Hassan Rowan M.D.   On: 05/09/2014 08:29   Ct Angio Chest Pe W/cm &/or Wo Cm  05/08/2014   CLINICAL DATA:  Shortness of breath  EXAM: CT ANGIOGRAPHY CHEST WITH CONTRAST  TECHNIQUE: Multidetector CT imaging of the chest was performed using the standard protocol during bolus administration of intravenous contrast. Multiplanar CT image reconstructions and MIPs were obtained to evaluate the vascular anatomy.  CONTRAST:  30m OMNIPAQUE IOHEXOL 350 MG/ML SOLN  COMPARISON:  Chest radiograph 05/07/2014: CT scan abdomen pelvis 05/07/2014  FINDINGS: There are bilateral filling defects in the pulmonary arterial system. There is a small volume of thrombus with in the left lower lobe pulmonary arterial system involving a few tertiary branches. Thrombosis is not occlusive. On the right, there are also several filling defects and lower lobe pulmonary arterial branches which are not occlusive.  There is mild bilateral lower lobe atelectasis. There is no significant pleural or pericardial effusion. There is no  significant hilar or mediastinal adenopathy. There is calcification of the left anterior descending coronary artery.  Esophagus is distended and filled with fluid. No acute musculoskeletal findings involving the thorax. Low-attenuation lesion right dome of the liver as described on 05/07/2014 CT abdomen.  Review of the MIP images confirms the above findings.  IMPRESSION: Acute nonobstructive bilateral pulmonary emboli. Critical Value/emergent results were called by telephone at the time of interpretation on 05/08/2014 at 10:00 pm GElzie Rings the patient's nurse, who verbally acknowledged these results and indicated she would relay this information to the patient's referring physician.   Electronically Signed   By: RSkipper ClicheM.D.   On: 05/08/2014 22:00    Scheduled Meds: . antiseptic oral rinse  7 mL Mouth Rinse BID  . enoxaparin (LOVENOX) injection  40 mg Subcutaneous Q12H  . feeding supplement (ENSURE)  1 Container Oral TID BM  . folic acid  1 mg Oral Daily  . gabapentin  1,500 mg Oral BID   And  . gabapentin  1,200 mg Oral Q24H  . insulin aspart  0-5 Units Subcutaneous QHS  . insulin aspart  0-9 Units Subcutaneous TID WC  . levofloxacin (LEVAQUIN) IV  750 mg Intravenous Q48H  . loratadine  10 mg Oral Daily  . ondansetron (ZOFRAN) IV  4 mg Intravenous Q6H  . pantoprazole (PROTONIX) IV  40 mg Intravenous QHS  . sodium chloride  1,000 mL Intravenous Once  . sodium chloride  3 mL Intravenous Q12H  . sulfaSALAzine  1,000 mg Oral Q breakfast  . traZODone  50 mg Oral QHS  . venlafaxine  25  mg Oral BID WC   Continuous Infusions: . sodium chloride 125 mL/hr at 05/10/14 0402    Principal Problem:   SIRS (systemic inflammatory response syndrome) Active Problems:   DIABETES MELLITUS, TYPE II   ASTHMA   Acute respiratory failure   Crohn's disease   Nicotine abuse    Time spent:25    [place] [facility] Pager [phone]. If 7PM-7AM, please contact night-coverage at  [email or web address], password [facility] 05/10/2014, 9:07 AM  LOS: 3 days

## 2014-05-10 NOTE — Progress Notes (Addendum)
ANTICOAGULATION & ANTIBIOTIC CONSULT NOTE - Follow Up Consult  Pharmacy Consult for Lovenox; Levaquin Indication: pulmonary embolus and r/o pneumonia/GI source  Allergies  Allergen Reactions  . Iohexol Other (See Comments)    "severe burning" Patient has received Contrast in 2005 with 13 hour pre-medication, and had no reaction at that time  . Penicillins Hives   Patient Measurements: Height: 5\' 6"  (167.6 cm) Weight: 88 lb 1.6 oz (39.962 kg) IBW/kg (Calculated) : 59.3 Vital Signs: Temp: 97.9 F (36.6 C) (08/10 0026) Temp src: Axillary (08/10 0026) BP: 107/79 mmHg (08/10 0449) Pulse Rate: 110 (08/10 0800) Labs:  Recent Labs  05/07/14 2102 05/08/14 0406 05/09/14 0339 05/09/14 0700  HGB 12.3 10.3* 9.0*  --   HCT 36.9 31.2* 27.9*  --   PLT 348 292 306  --   CREATININE 1.34* 1.25*  --  0.73    Estimated Creatinine Clearance: 50.2 ml/min (by C-G formula based on Cr of 0.73).  Medications:  Lovenox 40mg  SQ every 12 hours Levaquin 750mg  IV every 48 hours  Assessment: 53 YOF on day # 4 of antibiotics (currently Levaquin alone) for r/o aspiration pneumonia in the setting of nausea/vomiting/diarrhea. Patient's GI symptoms are now greatly improved and tolerating a regular diet. WBC trended up on 8/9 at 15.6- no new labs today. Cultures remain negative. CrCl ~ 50 based on labs from 8/9.  Patient is also on therapeutic Lovenox for pulmonary embolus. H/H were down some on 8/9 but platelets were within normal limits. No repeat CBC today. No signs or symptoms of bleeding.  Goal of Therapy:  Anti-Xa level 0.6-1 units/ml 4hrs after LMWH dose given Monitor platelets by anticoagulation protocol: Yes Clinical resolution of infection  Plan:  1. Continue Levaquin 750 mg IV every 48 hours. 2. Monitor GI symptoms and change to oral when tolerating well. 3. Follow-up duration of therapy. 4. Continue Lovenox 40mg  (1mg /kg) SQ every 12 hours. 5. Monitor CBC and for signs/symptoms of  bleeding.  6. Follow-up long term anticoagulation plan: Note patient interested in Eliquis (no dosage adjustment for PE however patients with SCr >2.5 or CrCl <25 were excluded from the trials).   Link Snuffer, PharmD, BCPS Clinical Pharmacist [phone] 05/10/2014,8:17 AM  Adjusting Levaquin for improved CrCl to q24h regimen.  11:35 AM. 05/11/2014

## 2014-05-10 NOTE — Progress Notes (Signed)
Lab called to inform primary RN, that the GI pathogen panel, was mixed in the wrong solution when previously sent and needed to be re-ordered and sent again for correct results. Ordered placed and will informed oncoming staff.

## 2014-05-11 ENCOUNTER — Inpatient Hospital Stay (HOSPITAL_COMMUNITY): Payer: Medicare Other

## 2014-05-11 DIAGNOSIS — E872 Acidosis, unspecified: Secondary | ICD-10-CM

## 2014-05-11 LAB — BASIC METABOLIC PANEL
ANION GAP: 14 (ref 5–15)
BUN: 5 mg/dL — ABNORMAL LOW (ref 6–23)
CALCIUM: 5.4 mg/dL — AB (ref 8.4–10.5)
CO2: 13 meq/L — AB (ref 19–32)
Chloride: 115 mEq/L — ABNORMAL HIGH (ref 96–112)
Creatinine, Ser: 0.62 mg/dL (ref 0.50–1.10)
GFR calc non Af Amer: >90 mL/min (ref >90)
Glucose, Bld: 68 mg/dL — ABNORMAL LOW (ref 70–99)
Potassium: 3.2 mEq/L — ABNORMAL LOW (ref 3.7–5.3)
SODIUM: 142 meq/L (ref 137–147)

## 2014-05-11 LAB — CBC
HEMATOCRIT: 30.4 % — AB (ref 36.0–46.0)
Hemoglobin: 9.4 g/dL — ABNORMAL LOW (ref 12.0–15.0)
MCH: 28.9 pg (ref 26.0–34.0)
MCHC: 30.9 g/dL (ref 30.0–36.0)
MCV: 93.5 fL (ref 78.0–100.0)
Platelets: 280 10*3/uL (ref 150–400)
RBC: 3.25 MIL/uL — ABNORMAL LOW (ref 3.87–5.11)
RDW: 17.1 % — AB (ref 11.5–15.5)
WBC: 11.9 10*3/uL — AB (ref 4.0–10.5)

## 2014-05-11 LAB — LACTIC ACID, PLASMA: Lactic Acid, Venous: 2.4 mmol/L — ABNORMAL HIGH (ref 0.5–2.2)

## 2014-05-11 LAB — GLUCOSE, CAPILLARY
GLUCOSE-CAPILLARY: 77 mg/dL (ref 70–99)
Glucose-Capillary: 92 mg/dL (ref 70–99)
Glucose-Capillary: 92 mg/dL (ref 70–99)
Glucose-Capillary: 93 mg/dL (ref 70–99)

## 2014-05-11 LAB — PHOSPHORUS: Phosphorus: 1.7 mg/dL — ABNORMAL LOW (ref 2.3–4.6)

## 2014-05-11 MED ORDER — LOPERAMIDE HCL 2 MG PO CAPS
2.0000 mg | ORAL_CAPSULE | ORAL | Status: DC | PRN
  Administered 2014-05-12: 2 mg via ORAL
  Filled 2014-05-11 (×2): qty 1

## 2014-05-11 MED ORDER — LEVOFLOXACIN IN D5W 750 MG/150ML IV SOLN
750.0000 mg | INTRAVENOUS | Status: DC
  Administered 2014-05-11: 750 mg via INTRAVENOUS
  Filled 2014-05-11 (×3): qty 150

## 2014-05-11 MED ORDER — LOPERAMIDE HCL 2 MG PO CAPS
4.0000 mg | ORAL_CAPSULE | Freq: Once | ORAL | Status: AC
  Administered 2014-05-11: 4 mg via ORAL
  Filled 2014-05-11: qty 2

## 2014-05-11 MED ORDER — SODIUM CHLORIDE 0.9 % IV SOLN
2.0000 g | Freq: Once | INTRAVENOUS | Status: AC
  Administered 2014-05-11: 2 g via INTRAVENOUS
  Filled 2014-05-11: qty 20

## 2014-05-11 NOTE — Progress Notes (Signed)
CRITICAL VALUE ALERT  Critical value received:   Date of notification:  05/11/2014     Time of notification:  9:17 AM   Critical value read back:Yes.    Nurse who received alert:  Cresenciano LickMikaela Daksha Koone  MD notified (1st page):  Viyuoh MD   Time of first page:  9:18 AM  Responding MD:  Donna BernardViyouh MD notified verbally   Time MD responded:  9:19 AM

## 2014-05-11 NOTE — Progress Notes (Signed)
Inpatient Diabetes Program Recommendations  AACE/ADA: New Consensus Statement on Inpatient Glycemic Control (2013)  Target Ranges:  Prepandial:   less than 140 mg/dL      Peak postprandial:   less than 180 mg/dL (1-2 hours)      Critically ill patients:  140 - 180 mg/dL   Reason for Assessment: Hypoglycemia  Diabetes history: Type 2 Outpatient Diabetes medications: Amaryl 2 mg qd Current orders for Inpatient glycemic control: Novolog sensitive correction tid and HS scale  Results for [name], [name] (MRN [id number]) as of 05/11/2014 10:20  Ref. Range 05/10/2014 08:18 05/10/2014 11:45 05/10/2014 16:47 05/10/2014 21:31 05/11/2014 08:09  Glucose-Capillary Latest Range: 70-99 mg/dL 76 75 291 (H) 60 (L) 77    Note:  A1C was 4.9.  Accuracy could be affected by low hgb/hct of 9.4/30.4.  Received 2 units Novolog yesterday evening at supper with CBG of 60 mg/dl by HS.  Request MD consider the following:  Stop Novolog correction for now but continue CBG's ac and HS  May want to consider reducing home dose of Amaryl for discharge with follow-up at PCP ASAP Thank you.  Golden Emile S. Elsie Lincoln, RN, CNS, CDE Inpatient Diabetes Program, team pager [phone]

## 2014-05-11 NOTE — Progress Notes (Addendum)
[facility] PROGRESS NOTE  [name] ZOX:[id number] DOB: [birth year] DOA: 05/07/2014 PCP: Benito Mccreedy, MD Brief narrative is a 52 y.o. female with DM, HTN, Crohn's disease who smoke presents for and episode of syncope today and reports that she has been passing out frequently for 3 wks. She has been lightheaded while walking. She does not have seizure like activity when she passed out.  She is found to be hypotensive and tachycardic with Lactic acidosis and leukocytosis. She has been vomiting and having loose stools for months but her symptoms have been worse over the past couple of weeks. No blood noted in vomitus or in stool. She has lost a significant amount of weight but unable to quantify. She admitted to fevers, shortness of breath. she was admitted for further evaluation and management.  Assessment/Plan: Vomiting and diarrhea  -c dif neg, GI pathogen panel negative -per GI, no evidence of active crohns  -She continues to have diarrhea and no further vomiting -Will start on when necessary Imodium -If persisting recommended reconsulting GI-Dr. Mann/Hung-she was seen by Dr. Hilarie Fredrickson on call for Dr Mann/Hung over the weekend  new dilatation of pancreatic duct/ Chronic dilatation/Elevated alk Phos  -has chronic CBD dilatation and new dilatation of pancreatic duct on CT- also noted to have an atrophic pancreas - -will order MRI/MRCP today 8/11>> followup results and further manage accordingly  SIRS (systemic inflammatory response syndrome)  - leukocytosis,- unclear source  - CXR clear , follow and repeat CXR in am - c dif and GI pathogen panel neg and per GI as above no evidence of active crohns -leukocytosis, likely worsened by steroids( received prior to PE), improving on empiric Levaquin - blood cultures to date with no growth, UA negative  -Patient still with hypotension, likely due to PE and continued diarrhea see as discussed  -Continue hydration with IV fluids and  treating PE as discussed below  PE -CT angio with bil PEs, continue therapeutic lovenox -discussed oral anticoagulation options and she specifically wants Eliquis -will keep on LOVENOX for now as pt still tachy and hypotensive, follow and transition to oral anticoagulant when clinically appropriate   Syncope  - suspect orthostatic hypotension- CT head negative -Still hypotensive, will again bolus and follow - likely d/t PE, continue therapeutic dose Lovenox for now and follow  Acute respiratory failure  -hypoxia- likely d/t PE -on full dose Lovenox for now- as above  ARF  -resolved with hydration   History of DIABETES MELLITUS, TYPE II with neuropathy  -low dose insulin sliding scale- A1c nl at 4.9   ASTHMA  - she is no longer taking Spiriva (she states causes vomiting??)  - continue PRN Albuterol nebs  Crohn's disease  -cont Sulfasalazine  -GI consulted>> no evidence of active crohns Severe Protein Calorie Malnutrition -nutritional supplements, follow  Hypocalcemia -will check phos, parathyroid hormone, vitamin D. 25-hydroxy -Her corrected calcium is 6.84, will replace follow recheck  Metabolic acidosis, anion gap of 14 -Worsening even with Creatinine normalized, actually hypoglycemic a.m. -Will recheck lactic acid level -MRI/MRCP of the abdomen of abdomen the and follow  Code Status: Full Family Communication: None at the bedside Disposition Plan: To home when medically   Consultants:  GI  Procedures:  None  Antibiotics:  Levaquin started 8/7   Flagyl started 8/7>>8/9  HPI/Subjective: Complaining of some abdominal discomfort and this a.m. and continues to have diarrhea. She denies nausea or vomiting and wants to continue on current diet Objective: Filed Vitals:   05/11/14 0802  BP:  109/78  Pulse: 98  Temp: 98.8 F (37.1 C)  Resp:     Intake/Output Summary (Last 24 hours) at 05/11/14 0907 Last data filed at 05/11/14 0902  Gross per 24 hour   Intake   1847 ml  Output   1350 ml  Net    497 ml   Filed Weights   05/08/14 0115 05/09/14 0527 05/11/14 0451  Weight: 37.1 kg (81 lb 12.7 oz) 39.962 kg (88 lb 1.6 oz) 39.8 kg (87 lb 11.9 oz)    Exam:  General: alert & oriented x 3 In NAD Cardiovascular: RRR, nl S1 s2 Respiratory: CTAB Abdomen: soft +BS NT/ND, no masses palpable Extremities: No cyanosis and no edema    Data Reviewed: Basic Metabolic Panel:  Recent Labs Lab 05/07/14 2102 05/08/14 0406 05/09/14 0700 05/10/14 0930 05/11/14 0331  NA 136* 138 138 144 142  K 4.5 4.2 3.7 3.7 3.2*  CL 96 102 109 117* 115*  CO2 21 18* 16* 15* 13*  GLUCOSE 135* 133* 78 72 68*  BUN _0 5* 5*  CREATININE 1.34* 1.25* 0.73 0.68 0.62  CALCIUM 8.1* 6.7* 5.2* 5.2* 5.4*   Liver Function Tests:  Recent Labs Lab 05/07/14 2102 05/08/14 0406  AST 32 27  ALT 31 23  ALKPHOS 219* 183*  BILITOT 0.4 0.3  PROT 6.0 4.9*  ALBUMIN 2.5* 2.0*    Recent Labs Lab 05/07/14 2102  LIPASE 11   No results found for this basename: AMMONIA,  in the last 168 hours CBC:  Recent Labs Lab 05/07/14 2102 05/08/14 0406 05/09/14 0339 05/11/14 0331  WBC 17.5* 14.5* 15.6* 11.9*  NEUTROABS 13.9*  --   --   --   HGB 12.3 10.3* 9.0* 9.4*  HCT 36.9 31.2* 27.9* 30.4*  MCV 90.9 90.2 93.3 93.5  PLT 348 292 306 280   Cardiac Enzymes: No results found for this basename: CKTOTAL, CKMB, CKMBINDEX, TROPONINI,  in the last 168 hours BNP (last 3 results) No results found for this basename: PROBNP,  in the last 8760 hours CBG:  Recent Labs Lab 05/10/14 0818 05/10/14 1145 05/10/14 1647 05/10/14 2131 05/11/14 0809  GLUCAP 76 75 152* 60* 77    Recent Results (from the past 240 hour(s))  URINE CULTURE     Status: None   Collection Time    05/07/14 11:13 PM      Result Value Ref Range Status   Specimen Description URINE, CATHETERIZED   Final   Special Requests NONE   Final   Culture  Setup Time     Final   Value: 05/08/2014 00:46      Performed at [facility]     Final   Value: NO GROWTH     Performed at [facility]   Culture     Final   Value: NO GROWTH     Performed at [facility]   Report Status 05/09/2014 FINAL   Final  CULTURE, BLOOD (ROUTINE X 2)     Status: None   Collection Time    05/08/14 12:25 AM      Result Value Ref Range Status   Specimen Description BLOOD RIGHT FOREARM   Final   Special Requests BOTTLES DRAWN AEROBIC AND ANAEROBIC [facility] EACH   Final   Culture  Setup Time     Final   Value: 05/08/2014 11:23     Performed at [facility]     Final  Value:        BLOOD CULTURE RECEIVED NO GROWTH TO DATE CULTURE WILL BE HELD FOR 5 DAYS BEFORE ISSUING A FINAL NEGATIVE REPORT     Performed at [facility]   Report Status PENDING   Incomplete  CULTURE, BLOOD (ROUTINE X 2)     Status: None   Collection Time    05/08/14 12:35 AM      Result Value Ref Range Status   Specimen Description BLOOD LEFT FOREARM   Final   Special Requests BOTTLES DRAWN AEROBIC ONLY 3CC   Final   Culture  Setup Time     Final   Value: 05/08/2014 11:24     Performed at [facility]   Culture     Final   Value:        BLOOD CULTURE RECEIVED NO GROWTH TO DATE CULTURE WILL BE HELD FOR 5 DAYS BEFORE ISSUING A FINAL NEGATIVE REPORT     Performed at [facility]   Report Status PENDING   Incomplete  MRSA PCR SCREENING     Status: None   Collection Time    05/08/14  1:06 AM      Result Value Ref Range Status   MRSA by PCR NEGATIVE  NEGATIVE Final   Comment:            The GeneXpert MRSA Assay (FDA     approved for NASAL specimens     only), is one component of a     comprehensive MRSA colonization     surveillance program. It is not     intended to diagnose MRSA     infection nor to guide or     monitor treatment for     MRSA infections.  CLOSTRIDIUM DIFFICILE BY PCR     Status: None   Collection Time    05/08/14  4:19 PM      Result Value  Ref Range Status   C difficile by pcr NEGATIVE  NEGATIVE Final  STOOL CULTURE     Status: None   Collection Time    05/08/14  4:19 PM      Result Value Ref Range Status   Specimen Description STOOL   Final   Special Requests NONE   Final   Culture     Final   Value: NO SUSPICIOUS COLONIES, CONTINUING TO HOLD     Note: REDUCED NORMAL FLORA PRESENT     Performed at [facility]   Report Status PENDING   Incomplete     Studies: No results found.  Scheduled Meds: . antiseptic oral rinse  7 mL Mouth Rinse BID  . enoxaparin (LOVENOX) injection  40 mg Subcutaneous Q12H  . feeding supplement (ENSURE)  1 Container Oral TID BM  . folic acid  1 mg Oral Daily  . gabapentin  1,500 mg Oral BID   And  . gabapentin  1,200 mg Oral Q24H  . insulin aspart  0-5 Units Subcutaneous QHS  . insulin aspart  0-9 Units Subcutaneous TID WC  . levofloxacin (LEVAQUIN) IV  750 mg Intravenous Q48H  . loratadine  10 mg Oral Daily  . ondansetron (ZOFRAN) IV  4 mg Intravenous Q6H  . pantoprazole (PROTONIX) IV  40 mg Intravenous QHS  . sodium chloride  3 mL Intravenous Q12H  . sulfaSALAzine  1,000 mg Oral Q breakfast  . traZODone  50 mg Oral QHS  . venlafaxine  25 mg Oral BID WC   Continuous Infusions: .  sodium chloride 1,000 mL (05/10/14 1720)    Principal Problem:   SIRS (systemic inflammatory response syndrome) Active Problems:   DIABETES MELLITUS, TYPE II   ASTHMA   Acute respiratory failure   Crohn's disease   Nicotine abuse    Time spent:35    [facility] Pager [phone]. If 7PM-7AM, please contact night-coverage at [email or web address], password [facility] 05/11/2014, 9:07 AM  LOS: 4 days

## 2014-05-11 NOTE — Progress Notes (Signed)
Pt was brought down to MRI for MRCP.  Pt saw that it was going to be going into a tunnel and she refused, saying she was very claustrophobic and if we didn't want to see her freak out we better not put her in there.  Floor nurse was called, said the only meds she had to give was PO ativan.  For an MRCP it is important not to have fluid in the upper digestive tract in order to see the bile ducts effectively.  RN was unable to call for any type of IV medication, so patient was taken back up to the floor.

## 2014-05-11 NOTE — Progress Notes (Signed)
MEDICATION RELATED CONSULT NOTE - INITIAL   Pharmacy Consult for Calcium replacement Indication: Hypocalcemia  Allergies  Allergen Reactions  . Iohexol Other (See Comments)    "severe burning" Patient has received Contrast in 2005 with 13 hour pre-medication, and had no reaction at that time  . Penicillins Hives    Patient Measurements: Height: 5\' 6"  (167.6 cm) Weight: 87 lb 11.9 oz (39.8 kg) IBW/kg (Calculated) : 59.3  Vital Signs: Temp: 98.2 F (36.8 C) (08/11 1657) Temp src: Oral (08/11 1657) BP: 99/69 mmHg (08/11 1824) Pulse Rate: 96 (08/11 1824) Intake/Output from previous day: 08/10 0701 - 08/11 0700 In: 2344 [P.O.:838; I.V.:1506] Out: 800 [Urine:800] Intake/Output from this shift:    Labs:  Recent Labs  05/09/14 0339 05/09/14 0700 05/10/14 0930 05/11/14 0331  WBC 15.6*  --   --  11.9*  HGB 9.0*  --   --  9.4*  HCT 27.9*  --   --  30.4*  PLT 306  --   --  280  CREATININE  --  0.73 0.68 0.62   Estimated Creatinine Clearance: 49.9 ml/min (by C-G formula based on Cr of 0.62).   Microbiology: Recent Results (from the past 720 hour(s))  URINE CULTURE     Status: None   Collection Time    05/07/14 11:13 PM      Result Value Ref Range Status   Specimen Description URINE, CATHETERIZED   Final   Special Requests NONE   Final   Culture  Setup Time     Final   Value: 05/08/2014 00:46     Performed at [facility]   Colony Count     Final   Value: NO GROWTH     Performed at [facility]   Culture     Final   Value: NO GROWTH     Performed at [facility]   Report Status 05/09/2014 FINAL   Final  CULTURE, BLOOD (ROUTINE X 2)     Status: None   Collection Time    05/08/14 12:25 AM      Result Value Ref Range Status   Specimen Description BLOOD RIGHT FOREARM   Final   Special Requests BOTTLES DRAWN AEROBIC AND ANAEROBIC 5CC EACH   Final   Culture  Setup Time     Final   Value: 05/08/2014 11:23     Performed at [facility]   Culture     Final   Value:        BLOOD CULTURE RECEIVED NO GROWTH TO DATE CULTURE WILL BE HELD FOR 5 DAYS BEFORE ISSUING A FINAL NEGATIVE REPORT     Performed at [facility]   Report Status PENDING   Incomplete  CULTURE, BLOOD (ROUTINE X 2)     Status: None   Collection Time    05/08/14 12:35 AM      Result Value Ref Range Status   Specimen Description BLOOD LEFT FOREARM   Final   Special Requests BOTTLES DRAWN AEROBIC ONLY 3CC   Final   Culture  Setup Time     Final   Value: 05/08/2014 11:24     Performed at [facility]   Culture     Final   Value:        BLOOD CULTURE RECEIVED NO GROWTH TO DATE CULTURE WILL BE HELD FOR 5 DAYS BEFORE ISSUING A FINAL NEGATIVE REPORT     Performed at [facility]   Report Status PENDING  Incomplete  MRSA PCR SCREENING     Status: None   Collection Time    05/08/14  1:06 AM      Result Value Ref Range Status   MRSA by PCR NEGATIVE  NEGATIVE Final   Comment:            The GeneXpert MRSA Assay (FDA     approved for NASAL specimens     only), is one component of a     comprehensive MRSA colonization     surveillance program. It is not     intended to diagnose MRSA     infection nor to guide or     monitor treatment for     MRSA infections.  CLOSTRIDIUM DIFFICILE BY PCR     Status: None   Collection Time    05/08/14  4:19 PM      Result Value Ref Range Status   C difficile by pcr NEGATIVE  NEGATIVE Final  STOOL CULTURE     Status: None   Collection Time    05/08/14  4:19 PM      Result Value Ref Range Status   Specimen Description STOOL   Final   Special Requests NONE   Final   Culture     Final   Value: NO SUSPICIOUS COLONIES, CONTINUING TO HOLD     Note: REDUCED NORMAL FLORA PRESENT     [facility]   Report Status PENDING   Incomplete    Medical History: Past Medical History  Diagnosis Date  . Glaucoma   . Diabetes mellitus without complication   . Hypertension   .  Neuropathy   . Crohn disease   . Asthma     Medications:  Prescriptions prior to admission  Medication Sig Dispense Refill  . alprazolam (XANAX) 2 MG tablet Take 2 mg by mouth 3 (three) times daily as needed for anxiety.      Marland Kitchen. amitriptyline (ELAVIL) 50 MG tablet Take 1 tablet (50 mg total) by mouth daily.  30 tablet  0  . cetirizine (ZYRTEC) 10 MG tablet Take 10 mg by mouth every morning.      . folic acid (FOLVITE) 1 MG tablet Take 1 tablet (1 mg total) by mouth daily.  30 tablet  0  . gabapentin (NEURONTIN) 300 MG capsule Take 1,200-1,500 mg by mouth 3 (three) times daily.       Marland Kitchen. glimepiride (AMARYL) 2 MG tablet Take 2 mg by mouth daily as needed (diet).       Marland Kitchen. HYDROcodone-acetaminophen (NORCO) 10-325 MG per tablet Take 1 tablet by mouth every 6 (six) hours as needed for moderate pain.      Marland Kitchen. HYDROmorphone (DILAUDID) 8 MG tablet Take 8 mg by mouth 2 (two) times daily as needed for severe pain.      Marland Kitchen. lisinopril (PRINIVIL,ZESTRIL) 5 MG tablet Take 5 mg by mouth at bedtime.       . meclizine (ANTIVERT) 25 MG tablet Take 25 mg by mouth 3 (three) times daily as needed for dizziness.       . methocarbamol (ROBAXIN) 750 MG tablet Take 1 tablet (750 mg total) by mouth 2 (two) times daily as needed. For muscle spasms  30 tablet  0  . ondansetron (ZOFRAN) 4 MG tablet Take 4 mg by mouth every 8 (eight) hours as needed for nausea.      . pantoprazole (PROTONIX) 20 MG tablet Take 40 mg by mouth 2 (two) times daily.      .Marland Kitchen  polyethylene glycol (MIRALAX / GLYCOLAX) packet Take 17 g by mouth daily.      . promethazine (PHENERGAN) 25 MG tablet Take 25 mg by mouth 2 (two) times daily as needed. For nausea      . simvastatin (ZOCOR) 40 MG tablet Take 40 mg by mouth at bedtime.       . sulfaSALAzine (AZULFIDINE) 500 MG tablet Take 1,000 mg by mouth daily.       Marland Kitchen thiamine 100 MG tablet Take 1 tablet (100 mg total) by mouth daily.  30 tablet  0  . tiotropium (SPIRIVA) 18 MCG inhalation capsule Place 18 mcg  into inhaler and inhale daily.      . traZODone (DESYREL) 50 MG tablet Take 50 mg by mouth daily.        Marland Kitchen venlafaxine (EFFEXOR) 25 MG tablet Take 25 mg by mouth 2 (two) times daily.        Assessment: 53 yo F admitted 05/07/2014 with frequent syncope, vomiting and loose stools.  Known to pharmacy for lovenox and antibiotic dosing.  Pharmacy consulted to replace calcium and follow.  Renal/FEN: SCr trending down 0.62 (no reported PMH of dysfunction); CoCa low at ~7 (assuming stable albumin). NS @ 125. K low at 3.2  Goal of Therapy:  Corrected Ca > 7.5  Plan:  Calcium Gluconate 2 g IV x 1  Follow up Ca, Albumin Phos, K & Mg in am with BMET.  Thank you for allowing pharmacy to be a part of this patients care team.  Lovenia Kim Pharm.D., BCPS, AQ-Cardiology Clinical Pharmacist 05/11/2014 7:23 PM Pager: [phone] Phone: [phone]

## 2014-05-12 DIAGNOSIS — K7460 Unspecified cirrhosis of liver: Secondary | ICD-10-CM

## 2014-05-12 LAB — COMPREHENSIVE METABOLIC PANEL
ALBUMIN: 1.5 g/dL — AB (ref 3.5–5.2)
ALK PHOS: 107 U/L (ref 39–117)
ALT: 19 U/L (ref 0–35)
ANION GAP: 13 (ref 5–15)
AST: 30 U/L (ref 0–37)
BUN: 5 mg/dL — AB (ref 6–23)
CHLORIDE: 118 meq/L — AB (ref 96–112)
CO2: 15 mEq/L — ABNORMAL LOW (ref 19–32)
Calcium: 6 mg/dL — CL (ref 8.4–10.5)
Creatinine, Ser: 0.6 mg/dL (ref 0.50–1.10)
GFR calc Af Amer: >90 mL/min (ref >90)
GFR calc non Af Amer: >90 mL/min (ref >90)
Glucose, Bld: 95 mg/dL (ref 70–99)
POTASSIUM: 3.1 meq/L — AB (ref 3.7–5.3)
SODIUM: 146 meq/L (ref 137–147)
Total Protein: 4.1 g/dL — ABNORMAL LOW (ref 6.0–8.3)

## 2014-05-12 LAB — GLUCOSE, CAPILLARY
GLUCOSE-CAPILLARY: 113 mg/dL — AB (ref 70–99)
GLUCOSE-CAPILLARY: 121 mg/dL — AB (ref 70–99)
Glucose-Capillary: 76 mg/dL (ref 70–99)
Glucose-Capillary: 137 mg/dL — ABNORMAL HIGH (ref 70–99)

## 2014-05-12 LAB — STOOL CULTURE

## 2014-05-12 LAB — VITAMIN D 25 HYDROXY (VIT D DEFICIENCY, FRACTURES): Vit D, 25-Hydroxy: 41 ng/mL (ref 30–89)

## 2014-05-12 MED ORDER — APIXABAN 5 MG PO TABS
5.0000 mg | ORAL_TABLET | Freq: Two times a day (BID) | ORAL | Status: DC
  Filled 2014-05-12 (×2): qty 1

## 2014-05-12 MED ORDER — POTASSIUM CHLORIDE CRYS ER 20 MEQ PO TBCR
40.0000 meq | EXTENDED_RELEASE_TABLET | Freq: Once | ORAL | Status: AC
  Administered 2014-05-12: 40 meq via ORAL
  Filled 2014-05-12: qty 2

## 2014-05-12 MED ORDER — LORAZEPAM 2 MG/ML IJ SOLN
1.0000 mg | Freq: Once | INTRAMUSCULAR | Status: AC
  Administered 2014-05-12: 1 mg via INTRAVENOUS
  Filled 2014-05-12: qty 1

## 2014-05-12 MED ORDER — APIXABAN 5 MG PO TABS
10.0000 mg | ORAL_TABLET | Freq: Two times a day (BID) | ORAL | Status: DC
  Administered 2014-05-12 – 2014-05-14 (×5): 10 mg via ORAL
  Filled 2014-05-12 (×6): qty 2

## 2014-05-12 MED ORDER — LEVOFLOXACIN IN D5W 750 MG/150ML IV SOLN: 750.0000 mg | INTRAVENOUS | Status: DC

## 2014-05-12 MED ORDER — APIXABAN 5 MG PO TABS: 5.0000 mg | ORAL_TABLET | Freq: Two times a day (BID) | ORAL | Status: DC

## 2014-05-12 NOTE — Progress Notes (Addendum)
MEDICATION RELATED CONSULT NOTE - Follow Up  Pharmacy Consult for Calcium replacement; Apixaban, Levofloxacin.  Indication: Hypocalcemia; PE, r/o PNA  Allergies  Allergen Reactions  . Iohexol Other (See Comments)    "severe burning" Patient has received Contrast in 2005 with 13 hour pre-medication, and had no reaction at that time  . Penicillins Hives    Patient Measurements: Height: 5\' 6"  (167.6 cm) Weight: 85 lb 15.7 oz (39 kg) IBW/kg (Calculated) : 59.3  Vital Signs: Temp: 98 F (36.7 C) (08/12 0430) Temp src: Oral (08/12 0430) BP: 99/69 mmHg (08/12 0600) Pulse Rate: 98 (08/12 0600) Intake/Output from previous day: 08/11 0701 - 08/12 0700 In: 1618 [P.O.:240; I.V.:1378] Out: 550 [Urine:550] Intake/Output from this shift:    Labs:  Recent Labs  05/10/14 0930 05/11/14 0331 05/11/14 2208 05/12/14 0354  WBC  --  11.9*  --   --   HGB  --  9.4*  --   --   HCT  --  30.4*  --   --   PLT  --  280  --   --   CREATININE 0.68 0.62  --  0.60  PHOS  --   --  1.7*  --   ALBUMIN  --   --   --  1.5*  PROT  --   --   --  4.1*  AST  --   --   --  30  ALT  --   --   --  19  ALKPHOS  --   --   --  107  BILITOT  --   --   --  <0.2*   Estimated Creatinine Clearance: 48.9 ml/min (by C-G formula based on Cr of 0.6).   Microbiology: Recent Results (from the past 720 hour(s))  URINE CULTURE     Status: None   Collection Time    05/07/14 11:13 PM      Result Value Ref Range Status   Specimen Description URINE, CATHETERIZED   Final   Special Requests NONE   Final   Culture  Setup Time     Final   Value: 05/08/2014 00:46     Performed at [facility]   Colony Count     Final   Value: NO GROWTH     Performed at [facility]   Culture     Final   Value: NO GROWTH     Performed at [facility]   Report Status 05/09/2014 FINAL   Final  CULTURE, BLOOD (ROUTINE X 2)     Status: None   Collection Time    05/08/14 12:25 AM      Result Value Ref Range  Status   Specimen Description BLOOD RIGHT FOREARM   Final   Special Requests BOTTLES DRAWN AEROBIC AND ANAEROBIC 5CC EACH   Final   Culture  Setup Time     Final   Value: 05/08/2014 11:23     Performed at [facility]   Culture     Final   Value:        BLOOD CULTURE RECEIVED NO GROWTH TO DATE CULTURE WILL BE HELD FOR 5 DAYS BEFORE ISSUING A FINAL NEGATIVE REPORT     Performed at [facility]   Report Status PENDING   Incomplete  CULTURE, BLOOD (ROUTINE X 2)     Status: None   Collection Time    05/08/14 12:35 AM      Result Value Ref Range Status  Specimen Description BLOOD LEFT FOREARM   Final   Special Requests BOTTLES DRAWN AEROBIC ONLY 3CC   Final   Culture  Setup Time     Final   Value: 05/08/2014 11:24     Performed at [facility]   Culture     Final   Value:        BLOOD CULTURE RECEIVED NO GROWTH TO DATE CULTURE WILL BE HELD FOR 5 DAYS BEFORE ISSUING A FINAL NEGATIVE REPORT     Performed at [facility]   Report Status PENDING   Incomplete  MRSA PCR SCREENING     Status: None   Collection Time    05/08/14  1:06 AM      Result Value Ref Range Status   MRSA by PCR NEGATIVE  NEGATIVE Final   Comment:            The GeneXpert MRSA Assay (FDA     approved for NASAL specimens     only), is one component of a     comprehensive MRSA colonization     surveillance program. It is not     intended to diagnose MRSA     infection nor to guide or     monitor treatment for     MRSA infections.  CLOSTRIDIUM DIFFICILE BY PCR     Status: None   Collection Time    05/08/14  4:19 PM      Result Value Ref Range Status   C difficile by pcr NEGATIVE  NEGATIVE Final  STOOL CULTURE     Status: None   Collection Time    05/08/14  4:19 PM      Result Value Ref Range Status   Specimen Description STOOL   Final   Special Requests NONE   Final   Culture     Final   Value: NO SUSPICIOUS COLONIES, CONTINUING TO HOLD     Note: REDUCED NORMAL FLORA PRESENT      Performed at [facility]   Report Status PENDING   Incomplete    Medical History: Past Medical History  Diagnosis Date  . Glaucoma   . Diabetes mellitus without complication   . Hypertension   . Neuropathy   . Crohn disease   . Asthma     Medications:  Prescriptions prior to admission  Medication Sig Dispense Refill  . alprazolam (XANAX) 2 MG tablet Take 2 mg by mouth 3 (three) times daily as needed for anxiety.      Marland Kitchen amitriptyline (ELAVIL) 50 MG tablet Take 1 tablet (50 mg total) by mouth daily.  30 tablet  0  . cetirizine (ZYRTEC) 10 MG tablet Take 10 mg by mouth every morning.      . folic acid (FOLVITE) 1 MG tablet Take 1 tablet (1 mg total) by mouth daily.  30 tablet  0  . gabapentin (NEURONTIN) 300 MG capsule Take 1,200-1,500 mg by mouth 3 (three) times daily.       Marland Kitchen glimepiride (AMARYL) 2 MG tablet Take 2 mg by mouth daily as needed (diet).       Marland Kitchen HYDROcodone-acetaminophen (NORCO) 10-325 MG per tablet Take 1 tablet by mouth every 6 (six) hours as needed for moderate pain.      Marland Kitchen HYDROmorphone (DILAUDID) 8 MG tablet Take 8 mg by mouth 2 (two) times daily as needed for severe pain.      Marland Kitchen lisinopril (PRINIVIL,ZESTRIL) 5 MG tablet Take 5 mg by mouth at bedtime.       Marland Kitchen  meclizine (ANTIVERT) 25 MG tablet Take 25 mg by mouth 3 (three) times daily as needed for dizziness.       . methocarbamol (ROBAXIN) 750 MG tablet Take 1 tablet (750 mg total) by mouth 2 (two) times daily as needed. For muscle spasms  30 tablet  0  . ondansetron (ZOFRAN) 4 MG tablet Take 4 mg by mouth every 8 (eight) hours as needed for nausea.      . pantoprazole (PROTONIX) 20 MG tablet Take 40 mg by mouth 2 (two) times daily.      . polyethylene glycol (MIRALAX / GLYCOLAX) packet Take 17 g by mouth daily.      . promethazine (PHENERGAN) 25 MG tablet Take 25 mg by mouth 2 (two) times daily as needed. For nausea      . simvastatin (ZOCOR) 40 MG tablet Take 40 mg by mouth at bedtime.       .  sulfaSALAzine (AZULFIDINE) 500 MG tablet Take 1,000 mg by mouth daily.       Marland Kitchen thiamine 100 MG tablet Take 1 tablet (100 mg total) by mouth daily.  30 tablet  0  . tiotropium (SPIRIVA) 18 MCG inhalation capsule Place 18 mcg into inhaler and inhale daily.      . traZODone (DESYREL) 50 MG tablet Take 50 mg by mouth daily.        Marland Kitchen venlafaxine (EFFEXOR) 25 MG tablet Take 25 mg by mouth 2 (two) times daily.        Assessment: 53 yo F admitted 05/07/2014 with frequent syncope, vomiting and loose stools found to have PE.  Pharmacy consulted to replace calcium and follow, as well as lovenox and levofloxacin  PMH: glaucoma, DMII, HTN, neuropathy, Crohn's disease, asthma  Renal/FEN: SCr trending down 0.60 (no reported PMH of dysfunction); CoCa now ~8 after 2g CaGluconate 8/11 pm, Albumin now 1.5. Vit D & PTH levels in process.  NS @ 125> dec to 75 ml/hr. K remains low> oral replacement ordered.  Phos = 1.7   AC: PE on chest CT -- started Lovenox on 8/8 (1mg /kg q12). Monitor weight as trending down.   ID: SIRS/?CAP/aspiration PNA with frequent vomiting at home Afeb, WBC trending down, day #6 abx for  CXR clear but may have underlying PNA not visible d/t extreme dehydration (last CXR 8/9)  Cefepime 8/7>>8/8 LVQ 8/8>> Flagyl 8/8>>8/9  8/7 UCx>>NEG 8/8 BCx2>> ngtd 8/8 Cdiff>>NEG 8/8 Stool >> ngtd  CV: BP soft, HR up to 133 (ST/NSR)-- no meds currently  Endo: CBG 60-77 low on SSI   GI/Nutrition: Crohns, Sulfasalazine, PPI, Carb modf. N/V/D improved- no active Crohn's seen. Atrophic pancreas- possible MIR/MRCP prior to discharge. GI pathogen panel negative.   Neuro: Gabapentin restarted (very high dose - MD sticky pending), trazodone, Effexor, prn xanax. Pain score = 3  Pulm: 100% 3L [place]  Heme/Onc: H/H up some (9.4), plt wnl and stable, no reported s/s bleeding  PTA Med Issues: amitriptyline, folic acid, lisinopril, glimepiride, thiamine  Best Practices: Lovenox treatment, SCDs on,  PPI  Goal of Therapy:  Corrected Ca > 7.5 4h Heparin level 0.6-1.1 Renal adjustment of antibiotics.   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 - Note High gabapentin dose (4200 mg/day) > max recommended, consider reducing dose    Thank you for allowing pharmacy to be a part of this patients care team.  Lovenia Kim Pharm.D., BCPS, AQ-Cardiology Clinical Pharmacist 05/12/2014 8:13 AM Pager: [phone] Phone: [phone]   Addendum:  CrCl ~49 ml/min due to low wt primarily. Will change Levaquin to 750mg  IV q48h.  Christoper Fabian, PharmD, BCPS Clinical pharmacist, pager [phone] 05/12/2014 2:39 PM

## 2014-05-12 NOTE — Care Management Note (Addendum)
    Page 1 of 1   05/14/2014     4:02:03 PM CARE MANAGEMENT NOTE 05/14/2014  Patient:  [name],[name]   Account Number:  [id number]  Date Initiated:  05/10/2014  Documentation initiated by:  MAYO,HENRIETTA  Subjective/Objective Assessment:   dx SIRS; lives with son    PCP  Amada KingfisherGeorge Osei Bonsu     Action/Plan:   Anticipated DC Date:  05/14/2014   Anticipated DC Plan:  IP REHAB FACILITY      DC Planning Services  CM consult  Medication Assistance      Choice offered to / List presented to:             Status of service:  Completed, signed off Medicare Important Message given?  YES (If response is "NO", the following Medicare IM given date fields will be blank) Date Medicare IM given:  05/10/2014 Medicare IM given by:  MAYO,HENRIETTA Date Additional Medicare IM given:   Additional Medicare IM given by:    Discharge Disposition:  IP REHAB FACILITY  Per UR Regulation:  Reviewed for med. necessity/level of care/duration of stay  If discussed at Long Length of Stay Meetings, dates discussed:    Comments:  ContactBonna Gains:  McIlton,[name] Son [phone]  05/14/14 1217 Letha Capeeborah Chrislynn Mosely RN BSN [phone] per physial therapy recs CIR vs SNF.  CIR MD recs CIR. Antonieta PertLiason is going to speak with patient , awaiting to hear back from liason.  Patient will most likely go to CIR today.  05/12/14 1350 Letha Capeeborah Khaleef Ruby RN, BSN [phone] previous NCM has given patient her 30 day trial eliquis savings card and this is a preferred med for medicaid.  05/12/14 1024 Henrietta Mayo RN MSN BSN CCM Received referral to determine copay for Eliquis.  Pt has MCD with $1.20 copay for meds, Eliquis is on MCD preferred list.  Provided pt with 30-day free trial card.

## 2014-05-12 NOTE — Progress Notes (Signed)
[facility] PROGRESS NOTE  [name] EGB:[id number] DOB: July 24, 1961 DOA: 05/07/2014 PCP: Benito Mccreedy, MD Brief narrative is a 53 y.o. female with DM, HTN, Crohn's disease who smoke presented following an episode of syncope today and reports that she has been passing out frequently for 3 wks. She has been lightheaded while walking. She does not have seizure like activity when she passed out.  She is found to be hypotensive and tachycardic with Lactic acidosis and leukocytosis. She has been vomiting and having loose stools for months but her symptoms have been worse over the past couple of weeks. No blood noted in vomitus or in stool. She has lost a significant amount of weight but unable to quantify. She admitted to fevers, shortness of breath. she was admitted for further evaluation and management.  Assessment/Plan: Vomiting and diarrhea  -c dif neg, GI pathogen panel negative -per GI, no evidence of active crohns  -improved, continue PRN Immodium -GI signed off, recommended FU with Dr.Hung   new dilatation of pancreatic duct/ Chronic dilatation/Elevated alk Phos  -has chronic CBD dilatation and new dilatation of pancreatic duct on CT- also noted to have an atrophic pancreas - -MRI/MRCP attempted and could not be done 8/11, will retry, if unsuccessful defer to Dr.Hung  SIRS (systemic inflammatory response syndrome)  - leukocytosis,- unclear source  - CXR clear , will DC levaquin since no clear indication for this - c dif and GI pathogen panel neg and per GI as above no evidence of active crohns -leukocytosis, likely worsened by steroids ( received prior to CTA to r/o PE)  improving  - blood cultures to date with no growth, UA negative  -cut down IVF  PE -CT angio with bil PEs, Day 3 of therapeutic lovenox -discussed oral anticoagulation options and she specifically wants Eliquis -transition to Eliquis    Syncope  - suspect orthostatic hypotension- CT head negative -BP  still soft, but per pt baseline BP low  ARF  -resolved with hydration  History of DIABETES MELLITUS, TYPE II with neuropathy  -Stop Insulin- A1c nl at 4.9   ASTHMA  - she is no longer taking Spiriva (she states causes vomiting??)  - continue PRN Albuterol nebs   Crohn's disease  -cont Sulfasalazine  -GI consulted>> no evidence of active crohns  Severe Protein Calorie Malnutrition -nutritional supplements, follow  Hypocalcemia -phos 1.7, parathyroid hormone, vitamin D. 25-hydroxy normal -Her corrected calcium is 6.84, will replace follow recheck  Metabolic acidosis, anion gap of 14 -Worsening even with Creatinine normalized,  -lactic 2.4  Pt/OT, ambulate  Code Status: Full Family Communication: None at the bedside Disposition Plan: To home when medically   Consultants:  GI  Procedures:  None  Antibiotics:  Levaquin started 8/7   Flagyl started 8/7>>8/9  HPI/Subjective: Feels better, no complaints  Objective: Filed Vitals:   05/12/14 1448  BP: 105/71  Pulse: 126  Temp: 98 F (36.7 C)  Resp: 18    Intake/Output Summary (Last 24 hours) at 05/12/14 1535 Last data filed at 05/12/14 0900  Gross per 24 hour  Intake 2218.33 ml  Output      0 ml  Net 2218.33 ml   Filed Weights   05/09/14 0527 05/11/14 0451 05/12/14 0430  Weight: 39.962 kg (88 lb 1.6 oz) 39.8 kg (87 lb 11.9 oz) 39 kg (85 lb 15.7 oz)    Exam:  General: alert & oriented x 3 In NAD Cardiovascular: RRR, nl S1 s2 Respiratory: CTAB Abdomen: soft +BS NT/ND, no masses  palpable Extremities: No cyanosis and no edema    Data Reviewed: Basic Metabolic Panel:  Recent Labs Lab 05/08/14 0406 05/09/14 0700 05/10/14 0930 05/11/14 0331 05/11/14 2208 05/12/14 0354  NA 138 138 144 142  --  146  K 4.2 3.7 3.7 3.2*  --  3.1*  CL 102 109 117* 115*  --  118*  CO2 18* 16* 15* 13*  --  15*  GLUCOSE 133* 78 72 68*  --  95  BUN 16 9 5* 5*  --  5*  CREATININE 1.25* 0.73 0.68 0.62  --  0.60   CALCIUM 6.7* 5.2* 5.2* 5.4*  --  6.0*  PHOS  --   --   --   --  1.7*  --    Liver Function Tests:  Recent Labs Lab 05/07/14 2102 05/08/14 0406 05/12/14 0354  AST 32 27 30  ALT 31 23 19   ALKPHOS 219* 183* 107  BILITOT 0.4 0.3 <0.2*  PROT 6.0 4.9* 4.1*  ALBUMIN 2.5* 2.0* 1.5*    Recent Labs Lab 05/07/14 2102  LIPASE 11   No results found for this basename: AMMONIA,  in the last 168 hours CBC:  Recent Labs Lab 05/07/14 2102 05/08/14 0406 05/09/14 0339 05/11/14 0331  WBC 17.5* 14.5* 15.6* 11.9*  NEUTROABS 13.9*  --   --   --   HGB 12.3 10.3* 9.0* 9.4*  HCT 36.9 31.2* 27.9* 30.4*  MCV 90.9 90.2 93.3 93.5  PLT 348 292 306 280   Cardiac Enzymes: No results found for this basename: CKTOTAL, CKMB, CKMBINDEX, TROPONINI,  in the last 168 hours BNP (last 3 results) No results found for this basename: PROBNP,  in the last 8760 hours CBG:  Recent Labs Lab 05/11/14 1242 05/11/14 1700 05/11/14 2256 05/12/14 0809 05/12/14 1245  GLUCAP 92 92 93 76 113*    Recent Results (from the past 240 hour(s))  URINE CULTURE     Status: None   Collection Time    05/07/14 11:13 PM      Result Value Ref Range Status   Specimen Description URINE, CATHETERIZED   Final   Special Requests NONE   Final   Culture  Setup Time     Final   Value: 05/08/2014 00:46     Performed at [facility] Count     Final   Value: NO GROWTH     Performed at [facility]   Culture     Final   Value: NO GROWTH     Performed at [facility]   Report Status 05/09/2014 FINAL   Final  CULTURE, BLOOD (ROUTINE X 2)     Status: None   Collection Time    05/08/14 12:25 AM      Result Value Ref Range Status   Specimen Description BLOOD RIGHT FOREARM   Final   Special Requests BOTTLES DRAWN AEROBIC AND ANAEROBIC 5CC EACH   Final   Culture  Setup Time     Final   Value: 05/08/2014 11:23     Performed at [facility]   Culture     Final   Value:        BLOOD  CULTURE RECEIVED NO GROWTH TO DATE CULTURE WILL BE HELD FOR 5 DAYS BEFORE ISSUING A FINAL NEGATIVE REPORT     Performed at [facility]   Report Status PENDING   Incomplete  CULTURE, BLOOD (ROUTINE X 2)     Status: None  Collection Time    05/08/14 12:35 AM      Result Value Ref Range Status   Specimen Description BLOOD LEFT FOREARM   Final   Special Requests BOTTLES DRAWN AEROBIC ONLY 3CC   Final   Culture  Setup Time     Final   Value: 05/08/2014 11:24     Performed at [facility]   Culture     Final   Value:        BLOOD CULTURE RECEIVED NO GROWTH TO DATE CULTURE WILL BE HELD FOR 5 DAYS BEFORE ISSUING A FINAL NEGATIVE REPORT     Performed at [facility]   Report Status PENDING   Incomplete  MRSA PCR SCREENING     Status: None   Collection Time    05/08/14  1:06 AM      Result Value Ref Range Status   MRSA by PCR NEGATIVE  NEGATIVE Final   Comment:            The GeneXpert MRSA Assay (FDA     approved for NASAL specimens     only), is one component of a     comprehensive MRSA colonization     surveillance program. It is not     intended to diagnose MRSA     infection nor to guide or     monitor treatment for     MRSA infections.  CLOSTRIDIUM DIFFICILE BY PCR     Status: None   Collection Time    05/08/14  4:19 PM      Result Value Ref Range Status   C difficile by pcr NEGATIVE  NEGATIVE Final  STOOL CULTURE     Status: None   Collection Time    05/08/14  4:19 PM      Result Value Ref Range Status   Specimen Description STOOL   Final   Special Requests NONE   Final   Culture     Final   Value: NO SALMONELLA, SHIGELLA, CAMPYLOBACTER, YERSINIA, OR E.COLI 0157:H7 ISOLATED     Note: REDUCED NORMAL FLORA PRESENT     Performed at [facility]   Report Status 05/12/2014 FINAL   Final     Studies: No results found.  Scheduled Meds: . antiseptic oral rinse  7 mL Mouth Rinse BID  . apixaban  10 mg Oral BID   Followed by  . [START ON  05/19/2014] apixaban  5 mg Oral BID  . feeding supplement (ENSURE)  1 Container Oral TID BM  . folic acid  1 mg Oral Daily  . gabapentin  1,500 mg Oral BID   And  . gabapentin  1,200 mg Oral Q24H  . [START ON 05/13/2014] levofloxacin (LEVAQUIN) IV  750 mg Intravenous Q48H  . loratadine  10 mg Oral Daily  . ondansetron (ZOFRAN) IV  4 mg Intravenous Q6H  . pantoprazole (PROTONIX) IV  40 mg Intravenous QHS  . sodium chloride  3 mL Intravenous Q12H  . sulfaSALAzine  1,000 mg Oral Q breakfast  . traZODone  50 mg Oral QHS  . venlafaxine  25 mg Oral BID WC   Continuous Infusions: . sodium chloride 75 mL/hr at 05/12/14 5945    Principal Problem:   SIRS (systemic inflammatory response syndrome) Active Problems:   DIABETES MELLITUS, TYPE II   ASTHMA   Acute respiratory failure   Crohn's disease   Nicotine abuse    Time spent:35    [facility]  [facility] Pager [phone].  If 7PM-7AM, please contact night-coverage at [email or web address], password [facility] 05/12/2014, 3:35 PM  LOS: 5 days

## 2014-05-13 ENCOUNTER — Inpatient Hospital Stay (HOSPITAL_COMMUNITY): Payer: Medicare Other

## 2014-05-13 LAB — BASIC METABOLIC PANEL
Anion gap: 14 (ref 5–15)
BUN: 3 mg/dL — AB (ref 6–23)
CALCIUM: 6 mg/dL — AB (ref 8.4–10.5)
CO2: 15 mEq/L — ABNORMAL LOW (ref 19–32)
Chloride: 117 mEq/L — ABNORMAL HIGH (ref 96–112)
Creatinine, Ser: 0.59 mg/dL (ref 0.50–1.10)
GFR calc Af Amer: >90 mL/min (ref >90)
Glucose, Bld: 96 mg/dL (ref 70–99)
POTASSIUM: 3.6 meq/L — AB (ref 3.7–5.3)
Sodium: 146 mEq/L (ref 137–147)

## 2014-05-13 LAB — GLUCOSE, CAPILLARY
GLUCOSE-CAPILLARY: 131 mg/dL — AB (ref 70–99)
Glucose-Capillary: 117 mg/dL — ABNORMAL HIGH (ref 70–99)
Glucose-Capillary: 180 mg/dL — ABNORMAL HIGH (ref 70–99)
Glucose-Capillary: 151 mg/dL — ABNORMAL HIGH (ref 70–99)

## 2014-05-13 LAB — CBC
HEMATOCRIT: 29 % — AB (ref 36.0–46.0)
Hemoglobin: 9.4 g/dL — ABNORMAL LOW (ref 12.0–15.0)
MCH: 30.2 pg (ref 26.0–34.0)
MCHC: 32.4 g/dL (ref 30.0–36.0)
MCV: 93.2 fL (ref 78.0–100.0)
Platelets: 322 10*3/uL (ref 150–400)
RBC: 3.11 MIL/uL — ABNORMAL LOW (ref 3.87–5.11)
RDW: 17.2 % — AB (ref 11.5–15.5)
WBC: 15.5 10*3/uL — ABNORMAL HIGH (ref 4.0–10.5)

## 2014-05-13 LAB — PARATHYROID HORMONE, INTACT (NO CA): PTH: 26.7 pg/mL (ref 14.0–72.0)

## 2014-05-13 LAB — TSH: TSH: 7.11 u[IU]/mL — AB (ref 0.350–4.500)

## 2014-05-13 MED ORDER — POTASSIUM PHOSPHATES 15 MMOLE/5ML IV SOLN
10.0000 mmol | Freq: Once | INTRAVENOUS | Status: AC
  Administered 2014-05-13: 10 mmol via INTRAVENOUS
  Filled 2014-05-13: qty 3.33

## 2014-05-13 MED ORDER — SODIUM CHLORIDE 0.9 % IV SOLN
2.0000 g | Freq: Once | INTRAVENOUS | Status: AC
  Administered 2014-05-13: 2 g via INTRAVENOUS
  Filled 2014-05-13: qty 20

## 2014-05-13 MED ORDER — GABAPENTIN 600 MG PO TABS
600.0000 mg | ORAL_TABLET | ORAL | Status: DC
  Administered 2014-05-14: 600 mg via ORAL
  Filled 2014-05-13: qty 1

## 2014-05-13 MED ORDER — GABAPENTIN 400 MG PO CAPS
400.0000 mg | ORAL_CAPSULE | Freq: Two times a day (BID) | ORAL | Status: DC
  Administered 2014-05-13 – 2014-05-14 (×2): 400 mg via ORAL
  Filled 2014-05-13 (×4): qty 1

## 2014-05-13 MED ORDER — LORAZEPAM 2 MG/ML IJ SOLN: 1.0000 mg | Freq: Once | INTRAMUSCULAR | Status: DC

## 2014-05-13 MED ORDER — PANTOPRAZOLE SODIUM 40 MG PO TBEC
40.0000 mg | DELAYED_RELEASE_TABLET | Freq: Every day | ORAL | Status: DC
  Administered 2014-05-13 – 2014-05-14 (×2): 40 mg via ORAL
  Filled 2014-05-13 (×2): qty 1

## 2014-05-13 MED ORDER — CALCIUM CARBONATE ANTACID 500 MG PO CHEW
2.0000 | CHEWABLE_TABLET | Freq: Three times a day (TID) | ORAL | Status: DC
  Administered 2014-05-13 – 2014-05-14 (×3): 400 mg via ORAL
  Filled 2014-05-13 (×5): qty 2

## 2014-05-13 NOTE — Progress Notes (Signed)
Rehab Admissions Coordinator Note:  Patient was screened by Clois Dupes for appropriateness for an Inpatient Acute Rehab Consult per Pt recommendation.  At this time, we are recommending Inpatient Rehab consult. I will contact Dr. Jomarie Longs for order.  Clois Dupes 05/13/2014, 5:40 PM  I can be reached at [phone].

## 2014-05-13 NOTE — Evaluation (Signed)
Physical Therapy Evaluation Patient Details Name: [name] MRN: [id number] DOB: Feb 20, 1961 Today's Date: 05/13/2014   History of Present Illness  Patient is a 53 y/o female admitted for syncope. Pt reports that she has been passing out frequently for 3 wks. Pt has been vomiting and having loose stools for months but her symptoms have been worse over the past couple of weeks. Diagnosed with SIRS and acute B PEs. Abnormal Ct with dilated pancreatic duct. PMH significant for HTN, DM, neuropathy, Crohn's disease and glaucoma.    Clinical Impression  Patient presents with functional limitations due to deficits listed in PT problem list (see below). Pt with generalized weakness and truncal/BUEs/BLEs ataxia/shaking noted in no particular pattern throughout evaluation, increased when sitting EOB and with standing. Pt requires assist with all mobility at this time due to safety concerns. Gait TBA next session as tolerated with 2 person assist. Pt very motivated to participate in therapy. Pt reports being ambulatory in June and part of July after working with HHPT however recently has been falling at home resulting in being bed bound. Pt not safe to be home alone as she requires assist with all care and mobility. Pt would benefit from CIR to improve safe mobility/strength, maximize independence and ease burden of care so pt can return to PLOF. If denied from CIR, recommend ST SNF.     Follow Up Recommendations CIR;SNF;Supervision/Assistance - 24 hour    Equipment Recommendations  None recommended by PT    Recommendations for Other Services OT consult     Precautions / Restrictions Precautions Precautions: Fall Precaution Comments: Back precautions secondary to severe back and neck pain with mobility. Restrictions Weight Bearing Restrictions: No      Mobility  Bed Mobility Overal bed mobility: Needs Assistance Bed Mobility: Rolling;Sidelying to Sit;Sit to Sidelying Rolling: Modified  independent (Device/Increase time) Sidelying to sit: Mod assist;HOB elevated     Sit to sidelying: Min assist;HOB elevated General bed mobility comments: Rolling to R/L x3 with rail for support; Sidelying to sit x2 with UEs manually mobilizing BLEs to EOB, assist with trunk and scooting bottom to EOB. Sit to sidelying x2 with pt manually lifting BLEs into bed. VC for technique. Assist with BLEs on second transfer to bed.   Transfers Overall transfer level: Needs assistance Equipment used: Rolling walker (2 wheeled) Transfers: Sit to/from [employer] Sit to Stand: Mod assist   Squat pivot transfers: Min assist     General transfer comment: Stood from EOB x1 with therapist stabilizing B feet from sliding forward, VC for hand placement and assist with anterior translation/hip extension. Increased tremoring BUEs/LEs and trunk upon standing Mod A for balance. SPT bed <->BSC with VC for hand placement/technique,  Ambulation/Gait             General Gait Details: TBA.  Stairs            Wheelchair Mobility    Modified Rankin (Stroke Patients Only)       Balance Overall balance assessment: Needs assistance Sitting-balance support: Bilateral upper extremity supported Sitting balance-Leahy Scale: Poor Sitting balance - Comments: Requires BUEs support sitting EOB to maintain balance, occasional LOB posteriorly and to the left with dynamic activities when not supported. Increased tremoring/shaking (ataxia?) noted EOB in trunk and BUEs/LEs. Able to reduce tremoring with approximation inconsistently. Min guard assist -Min A for support EOB.     Standing balance-Leahy Scale: Poor Standing balance comment: Able to stand with BUEs supported on RW and therapist stabilizing  patient with Mod A secondary to increased ataxia of trunk, BUEs, BLEs.                             Pertinent Vitals/Pain Pain Assessment: 0-10 Pain Score:  (Pain not rated on scale  ) Pain Location: back Pain Descriptors / Indicators: Sharp;Sore;Discomfort;Grimacing;Restless Pain Intervention(s): Limited activity within patient's tolerance;Patient requesting pain meds-RN notified;Monitored during session;Repositioned    Home Living Family/patient expects to be discharged to:: Private residence Living Arrangements: Children (53 y/o female.) Available Help at Discharge: Family;Available PRN/intermittently;Personal care attendant Type of Home: Apartment Home Access: Stairs to enter Entrance Stairs-Rails: Right;Left Entrance Stairs-Number of Steps: 2 flights of steps with landing. Home Layout: One level Home Equipment: Walker - 2 wheels;Cane - single point;Bedside commode Additional Comments: Has 74 y/o son who is not home much. Pt reports have "an assistant" who comes in for 4 hrs/day.    Prior Function Level of Independence: Needs assistance   Gait / Transfers Assistance Needed: Pt reports being nonambulatory for ~1 month. Finished HHPT in June and since then has had a recent decline in mobility. Increased falls when ambulating with RW.  ADL's / Homemaking Assistance Needed: Pt reports using diapers recently. Assistance helps with ADLs as requested. Pt does not like to ask for help. Pt reports she has not been eating recently.        Hand Dominance        Extremity/Trunk Assessment   Upper Extremity Assessment: RUE deficits/detail;LUE deficits/detail RUE Deficits / Details: Tremoring (ataxia?) noted throughout evaluation sporatically with no specific pattern - at rest and with intention or activity. Shoulder elevation decreased AROM - with pin rolling of digits noted upon elevation of UE. Grip functional.     LUE Deficits / Details: Tremoring (ataxia?) noted throughout evaluation sporatically with no specific pattern - at rest and with intention or activity. Shoulder elevation decreased AROM - with pin rolling of digits noted upon elevation of UE. Grip  functional. Able to feed self without tremoring noted in bed however increased tremoring noted LUE sitting EOB during drinking from a cup.   Lower Extremity Assessment: RLE deficits/detail;LLE deficits/detail;Generalized weakness RLE Deficits / Details: Sustained clonus of BLEs at rest with BLEs in PF position sporatically (LLE worse than RLE). Able to reduce shaking/tremoring (ataxia?) with approximation.        Communication   Communication: No difficulties  Cognition Arousal/Alertness: Awake/alert Behavior During Therapy: WFL for tasks assessed/performed Overall Cognitive Status: Within Functional Limits for tasks assessed                      General Comments General comments (skin integrity, edema, etc.): Multiple bruises present BUEs and scrapes/bruises along BLEs.    Exercises        Assessment/Plan    PT Assessment Patient needs continued PT services  PT Diagnosis Abnormality of gait;Generalized weakness   PT Problem List Decreased strength;Decreased coordination;Decreased range of motion;Decreased activity tolerance;Decreased balance;Decreased safety awareness;Decreased mobility;Decreased knowledge of precautions;Decreased skin integrity  PT Treatment Interventions DME instruction;Balance training;Gait training;Neuromuscular re-education;Patient/family education;Functional mobility training;Therapeutic activities;Therapeutic exercise;Stair training;Wheelchair mobility training   PT Goals (Current goals can be found in the Care Plan section) Acute Rehab PT Goals Patient Stated Goal: to be able to get up and walk and do for myself again like before PT Goal Formulation: With patient Time For Goal Achievement: 05/27/14 Potential to Achieve Goals: Fair    Frequency Min 4X/week  Barriers to discharge Decreased caregiver support Pt is usually home alone. 55 y/o son, "has his own life" per patient.    Co-evaluation               End of Session Equipment  Utilized During Treatment: Gait belt Activity Tolerance: Patient tolerated treatment well Patient left: in bed;with call bell/phone within reach;with bed alarm set;with nursing/sitter in room Nurse Communication: Mobility status;Precautions         Time: [id number] PT Time Calculation (min): 57 min   Charges:   PT Evaluation $Initial PT Evaluation Tier I: 1 Procedure PT Treatments $Therapeutic Activity: 23-37 mins $Neuromuscular Re-education: 8-22 mins   PT G CodesAlvie Heidelberg:          Folan, Najae Filsaime A 05/13/2014, 4:12 PM Alvie HeidelbergShauna Folan, PT, DPT [phone]

## 2014-05-13 NOTE — Progress Notes (Signed)
MEDICATION RELATED CONSULT NOTE - Follow Up  Pharmacy Consult for Calcium replacement Indication: Hypocalcemia  Allergies  Allergen Reactions  . Iohexol Other (See Comments)    "severe burning" Patient has received Contrast in 2005 with 13 hour pre-medication, and had no reaction at that time  . Penicillins Hives    Patient Measurements: Height: 5\' 6"  (167.6 cm) Weight: 85 lb 15.7 oz (39 kg) IBW/kg (Calculated) : 59.3  Vital Signs: Temp: 98 F (36.7 C) (08/13 0551) Temp src: Oral (08/13 0551) BP: 111/79 mmHg (08/13 0551) Pulse Rate: 102 (08/13 0551) Intake/Output from previous day: 08/12 0701 - 08/13 0700 In: 1843.3 [I.V.:1843.3] Out: 900 [Urine:900] Intake/Output from this shift:    Labs:  Recent Labs  05/11/14 0331 05/11/14 2208 05/12/14 0354 05/13/14 0900  WBC 11.9*  --   --  15.5*  HGB 9.4*  --   --  9.4*  HCT 30.4*  --   --  29.0*  PLT 280  --   --  322  CREATININE 0.62  --  0.60 0.59  PHOS  --  1.7*  --   --   ALBUMIN  --   --  1.5*  --   PROT  --   --  4.1*  --   AST  --   --  30  --   ALT  --   --  19  --   ALKPHOS  --   --  107  --   BILITOT  --   --  <0.2*  --    Estimated Creatinine Clearance: 48.9 ml/min (by C-G formula based on Cr of 0.59).   Assessment: 53 yo F admitted 05/07/2014 with frequent syncope, vomiting and loose stools found to have PE. Pt with consistently low calcium even after correction for low albumin. Pharmacy consulted to replace calcium. Ca 6 which corrects to ~8 for alb = 1.5. S/p 2gm Cagluc given 8/11 p.m. Noted vit D level wnl. PTH level in process. No Mg level checked yet.  Noted phos level low at 1.7 on 8/11. Not replaced yet.  Goal of Therapy:  Corrected Ca > 8.4  Plan: 1) Calcium gluconate 2gm IV today 2) Will f/u Ca and Mg level 3) Consider KPhos x1 for phosphorus replacement.  Thank you for allowing pharmacy to be a part of this patients care team.  Christoper Fabian, PharmD, BCPS Clinical pharmacist, pager  [phone] 05/13/2014 11:11 AM

## 2014-05-13 NOTE — Progress Notes (Signed)
CRITICAL VALUE ALERT  Critical value received:  Ca=6.0   Date of notification:  05/13/2014  Time of notification:  10:50  Critical value read back:Yes.    Nurse who received alert:  Lurena Nida  MD notified (1st page):  Mitchel Honour  Time of first page:  11:00  MD notified (2nd page):  Time of second page:  Responding MD:   New order  Time MD responded:  11:05

## 2014-05-13 NOTE — Progress Notes (Addendum)
[facility] PROGRESS NOTE  [name] JJK:[id number] DOB: 01/31/1961 DOA: 05/07/2014 PCP: Benito Mccreedy, MD Brief narrative is a 53 y.o. female with DM, HTN, Crohn's disease who smoke presented following an episode of syncope today and reports that she has been passing out frequently for 3 wks. She has been lightheaded while walking. She does not have seizure like activity when she passed out.  She is found to be hypotensive and tachycardic with Lactic acidosis and leukocytosis. She has been vomiting and having loose stools for months but her symptoms have been worse over the past couple of weeks. No blood noted in vomitus or in stool. She has lost a significant amount of weight but unable to quantify. She admitted to fevers, shortness of breath. she was admitted for further evaluation and management.  Assessment/Plan: Nausea / Vomiting and diarrhea  -likely viral gastroenteritis,  -suspect a component of some chronic diarrhea due to chronic pancreatitis -c dif neg, GI pathogen panel negative -per GI, no evidence of active crohns  -improved, continue PRN Imodium, tolerating diet -GI signed off, recommended FU with Dr.Hung   new dilatation of pancreatic duct/ Chronic dilatation/Elevated alk Phos  -has chronic CBD dilatation and new dilatation of pancreatic duct on CT- also noted to have an atrophic pancreas - -MRI/MRCP attempted and she declined this on 8/11, 8/13,will defer to Dr.Hung  SIRS (systemic inflammatory response syndrome)  - source likely 1, improved - CXR clear , stopped levaquin since no clear indication for this - c dif and GI pathogen panel neg and per GI as above no evidence of active crohns -leukocytosis, likely worsened by steroids ( received prior to CTA to r/o PE)  improving  - blood cultures to date with no growth, UA negative   Acute PE -CT angio with bil PEs, s/p 3 days of therapeutic lovenox -discussed oral anticoagulation options and she specifically  wants Eliquis -transitioned to Eliquis 8/12   Syncope  - suspect orthostatic hypotension- CT head negative -BP still soft, but stable per pt baseline BP low  ARF  -resolved with hydration  History of DIABETES MELLITUS, TYPE II with neuropathy  -Stop Insulin- A1c nl at 4.9   ASTHMA  - she is no longer taking Spiriva (she states causes vomiting??)  - continue PRN Albuterol nebs   Crohn's disease  -cont Sulfasalazine  -GI consulted>> no evidence of active crohns  Severe Protein Calorie Malnutrition -nutritional supplements, follow  Hypocalcemia -phos 1.7, parathyroid hormone, vitamin D. 25-hydroxy normal - corrected calcium is higher, replace Phos -Replace IV and PO  Metabolic acidosis,  -due to 1,3 -improving now  Pt/OT, ambulate  Code Status: Full Family Communication: None at the bedside Disposition Plan: CIR vs ST-rehab   Consultants:  GI  Procedures:  None  Antibiotics:  Levaquin started 8/7   Flagyl started 8/7>>8/9  HPI/Subjective: Feels better, no complaints  Objective: Filed Vitals:   05/13/14 0551  BP: 111/79  Pulse: 102  Temp: 98 F (36.7 C)  Resp: 16    Intake/Output Summary (Last 24 hours) at 05/13/14 1402 Last data filed at 05/13/14 0400  Gross per 24 hour  Intake      0 ml  Output    900 ml  Net   -900 ml   Filed Weights   05/09/14 0527 05/11/14 0451 05/12/14 0430  Weight: 39.962 kg (88 lb 1.6 oz) 39.8 kg (87 lb 11.9 oz) 39 kg (85 lb 15.7 oz)    Exam:  General: alert & oriented x 3,  no distress, chronically ill appearing Cardiovascular: RRR, nl S1 s2 Respiratory: CTAB Abdomen: soft +BS NT/ND, no masses palpable Extremities: No cyanosis and no edema    Data Reviewed: Basic Metabolic Panel:  Recent Labs Lab 05/09/14 0700 05/10/14 0930 05/11/14 0331 05/11/14 2208 05/12/14 0354 05/13/14 0900  NA 138 144 142  --  146 146  K 3.7 3.7 3.2*  --  3.1* 3.6*  CL 109 117* 115*  --  118* 117*  CO2 16* 15* 13*  --  15*  15*  GLUCOSE 78 72 68*  --  95 96  BUN 9 5* 5*  --  5* 3*  CREATININE 0.73 0.68 0.62  --  0.60 0.59  CALCIUM 5.2* 5.2* 5.4*  --  6.0* 6.0*  PHOS  --   --   --  1.7*  --   --    Liver Function Tests:  Recent Labs Lab 05/07/14 2102 05/08/14 0406 05/12/14 0354  AST 32 27 30  ALT _0 ALKPHOS 219* 183* 107  BILITOT 0.4 0.3 <0.2*  PROT 6.0 4.9* 4.1*  ALBUMIN 2.5* 2.0* 1.5*    Recent Labs Lab 05/07/14 2102  LIPASE 11   No results found for this basename: AMMONIA,  in the last 168 hours CBC:  Recent Labs Lab 05/07/14 2102 05/08/14 0406 05/09/14 0339 05/11/14 0331 05/13/14 0900  WBC 17.5* 14.5* 15.6* 11.9* 15.5*  NEUTROABS 13.9*  --   --   --   --   HGB 12.3 10.3* 9.0* 9.4* 9.4*  HCT 36.9 31.2* 27.9* 30.4* 29.0*  MCV 90.9 90.2 93.3 93.5 93.2  PLT 348 292 306 280 322   Cardiac Enzymes: No results found for this basename: CKTOTAL, CKMB, CKMBINDEX, TROPONINI,  in the last 168 hours BNP (last 3 results) No results found for this basename: PROBNP,  in the last 8760 hours CBG:  Recent Labs Lab 05/12/14 1245 05/12/14 1842 05/12/14 2109 05/13/14 0746 05/13/14 1131  GLUCAP 113* 137* 121* 117* 180*    Recent Results (from the past 240 hour(s))  URINE CULTURE     Status: None   Collection Time    05/07/14 11:13 PM      Result Value Ref Range Status   Specimen Description URINE, CATHETERIZED   Final   Special Requests NONE   Final   Culture  Setup Time     Final   Value: 05/08/2014 00:46     Performed at [facility]     Final   Value: NO GROWTH     Performed at [facility]   Culture     Final   Value: NO GROWTH     Performed at [facility]   Report Status 05/09/2014 FINAL   Final  CULTURE, BLOOD (ROUTINE X 2)     Status: None   Collection Time    05/08/14 12:25 AM      Result Value Ref Range Status   Specimen Description BLOOD RIGHT FOREARM   Final   Special Requests BOTTLES DRAWN AEROBIC AND ANAEROBIC 5CC  EACH   Final   Culture  Setup Time     Final   Value: 05/08/2014 11:23     Performed at [facility]   Culture     Final   Value:        BLOOD CULTURE RECEIVED NO GROWTH TO DATE CULTURE WILL BE HELD FOR 5 DAYS BEFORE ISSUING A FINAL NEGATIVE REPORT  Performed at [facility]   Report Status PENDING   Incomplete  CULTURE, BLOOD (ROUTINE X 2)     Status: None   Collection Time    05/08/14 12:35 AM      Result Value Ref Range Status   Specimen Description BLOOD LEFT FOREARM   Final   Special Requests BOTTLES DRAWN AEROBIC ONLY 3CC   Final   Culture  Setup Time     Final   Value: 05/08/2014 11:24     Performed at [facility]   Culture     Final   Value:        BLOOD CULTURE RECEIVED NO GROWTH TO DATE CULTURE WILL BE HELD FOR 5 DAYS BEFORE ISSUING A FINAL NEGATIVE REPORT     Performed at [facility]   Report Status PENDING   Incomplete  MRSA PCR SCREENING     Status: None   Collection Time    05/08/14  1:06 AM      Result Value Ref Range Status   MRSA by PCR NEGATIVE  NEGATIVE Final   Comment:            The GeneXpert MRSA Assay (FDA     approved for NASAL specimens     only), is one component of a     comprehensive MRSA colonization     surveillance program. It is not     intended to diagnose MRSA     infection nor to guide or     monitor treatment for     MRSA infections.  CLOSTRIDIUM DIFFICILE BY PCR     Status: None   Collection Time    05/08/14  4:19 PM      Result Value Ref Range Status   C difficile by pcr NEGATIVE  NEGATIVE Final  STOOL CULTURE     Status: None   Collection Time    05/08/14  4:19 PM      Result Value Ref Range Status   Specimen Description STOOL   Final   Special Requests NONE   Final   Culture     Final   Value: NO SALMONELLA, SHIGELLA, CAMPYLOBACTER, YERSINIA, OR E.COLI 0157:H7 ISOLATED     Note: REDUCED NORMAL FLORA PRESENT     Performed at [facility]   Report Status 05/12/2014 FINAL   Final      Studies: No results found.  Scheduled Meds: . antiseptic oral rinse  7 mL Mouth Rinse BID  . apixaban  10 mg Oral BID   Followed by  . [START ON 05/19/2014] apixaban  5 mg Oral BID  . feeding supplement (ENSURE)  1 Container Oral TID BM  . folic acid  1 mg Oral Daily  . gabapentin  1,500 mg Oral BID   And  . gabapentin  1,200 mg Oral Q24H  . loratadine  10 mg Oral Daily  . LORazepam  1 mg Intravenous Once  . ondansetron (ZOFRAN) IV  4 mg Intravenous Q6H  . pantoprazole  40 mg Oral Daily  . sodium chloride  3 mL Intravenous Q12H  . sulfaSALAzine  1,000 mg Oral Q breakfast  . traZODone  50 mg Oral QHS  . venlafaxine  25 mg Oral BID WC   Continuous Infusions: . sodium chloride 1,000 mL (05/13/14 1028)    Principal Problem:   SIRS (systemic inflammatory response syndrome) Active Problems:   DIABETES MELLITUS, TYPE II   ASTHMA   Acute respiratory failure   Crohn's  disease   Nicotine abuse    Time spent:71mn    Keelia Graybill  [facility] Pager [phone] 7PM-7AM, please contact night-coverage at [email or web address], password [email or web address] [facility]8/13/2015, 2:02 PM  LOS: 6 days

## 2014-05-13 NOTE — Progress Notes (Signed)
NUTRITION FOLLOW UP  Intervention:   1. General healthful diet; encourage intake of foods and beverages as able. RD to follow and assess for nutritional adequacy. MD in with patient to discuss diet advancement to solid foods.  2. Supplements; Ensure Pudding po BID, each supplement provides 170 kcal and 4 grams of protein  Nutrition Dx:   Inadequate oral intake; improving  Goal:   Pt to meet >/= 90% of their estimated nutrition needs; improving  Monitor:   1. Food/Beverage; pt meeting >/=90% estimated needs with tolerance.  2. Wt/wt change; monitor trends  Assessment:   53 y.o. female with DM, HTN, Crohn's disease who smoke presents for and episode of syncope today and reports that she has been passing out frequently for 3 wks. She has been lightheaded while walking. She does not have seizure like activity when she passed out.   Pt found to have bilateral PEs.  Pt with recent wt loss. Review of chart shows weight hx of 95-100 lbs. Pt lives at home with family.   8/9: Pt states that she has a very "finicky" stomach and appetite. She can only eat when "my brain and stomach say it is ok." Pt reports that she takes Phenegran at home to help with nausea and without it, she cannot tolerate food. Pt's dietary recall is variable and she admits there are days when she cannot eat without vomiting. When she does have "good days" pt states she will eat chips and dip (as one day's calorie intake) or have fried fish take-out small platter which lasts her 3 days.  Pt meets criteria for severe MALNUTRITION in the context of chronic illness as evidenced by PO intake insufficient to meet 75% of estimated needs and recent wt loss of 10.5% in 3-6 months.  8/13: - Pt reports that her appetite and po intake has improved over the past several days. She is excited that her weight is up from admission weight of 83 lbs. During RD visit pt was getting ready to eat lunch. She says that she does like Ensure Pudding,  and she is eating it.    Height: Ht Readings from Last 1 Encounters:  05/08/14 5\' 6"  (1.676 m)    Weight Status:   Wt Readings from Last 1 Encounters:  05/12/14 85 lb 15.7 oz (39 kg)    Re-estimated needs:  Kcal: 1650-1800 Protein: 80-90 g Fluid: 1.8 L/day  Skin: Intact  Diet Order: General   Intake/Output Summary (Last 24 hours) at 05/13/14 1333 Last data filed at 05/13/14 0400  Gross per 24 hour  Intake      0 ml  Output    900 ml  Net   -900 ml    Last BM: 8/13   Labs:   Recent Labs Lab 05/11/14 0331 05/11/14 2208 05/12/14 0354 05/13/14 0900  NA 142  --  146 146  K 3.2*  --  3.1* 3.6*  CL 115*  --  118* 117*  CO2 13*  --  15* 15*  BUN 5*  --  5* 3*  CREATININE 0.62  --  0.60 0.59  CALCIUM 5.4*  --  6.0* 6.0*  PHOS  --  1.7*  --   --   GLUCOSE 68*  --  95 96    CBG (last 3)   Recent Labs  05/12/14 2109 05/13/14 0746 05/13/14 1131  GLUCAP 121* 117* 180*    Scheduled Meds: . antiseptic oral rinse  7 mL Mouth Rinse BID  . apixaban  10 mg Oral BID   Followed by  . [START ON 05/19/2014] apixaban  5 mg Oral BID  . feeding supplement (ENSURE)  1 Container Oral TID BM  . folic acid  1 mg Oral Daily  . gabapentin  1,500 mg Oral BID   And  . gabapentin  1,200 mg Oral Q24H  . loratadine  10 mg Oral Daily  . LORazepam  1 mg Intravenous Once  . ondansetron (ZOFRAN) IV  4 mg Intravenous Q6H  . pantoprazole  40 mg Oral Daily  . sodium chloride  3 mL Intravenous Q12H  . sulfaSALAzine  1,000 mg Oral Q breakfast  . traZODone  50 mg Oral QHS  . venlafaxine  25 mg Oral BID WC    Continuous Infusions: . sodium chloride 1,000 mL (05/13/14 1028)    Ebbie LatusHaley Hawkins RD, LDN

## 2014-05-13 NOTE — Progress Notes (Signed)
PT Cancellation Note  Patient Details Name: [name] MRN: [id number] DOB: [birth year]   Cancelled Treatment:    Reason Eval/Treat Not Completed: Patient declined, no reason specified Pt declined participating in therapy this AM due to having a visitor from out of town. Will follow up later in PM if time or on 8/14.   Alvie Heidelberg A 05/13/2014, 12:40 PM Alvie Heidelberg, PT, DPT [phone]

## 2014-05-13 NOTE — Progress Notes (Signed)
Patient was encouraged to go to MRI for MRCP but she refused; she said she is claustrophobic and she will not go into that tunnel. MD informed.

## 2014-05-14 ENCOUNTER — Inpatient Hospital Stay (HOSPITAL_COMMUNITY)
Admission: RE | Admit: 2014-05-14 | Discharge: 2014-05-21 | DRG: 945 | Disposition: A | Discharge status: Home Health | Payer: Medicare Other | Source: Intra-hospital | Attending: Physical Medicine & Rehabilitation | Admitting: Physical Medicine & Rehabilitation

## 2014-05-14 DIAGNOSIS — R1032 Left lower quadrant pain: Secondary | ICD-10-CM

## 2014-05-14 DIAGNOSIS — E1169 Type 2 diabetes mellitus with other specified complication: Secondary | ICD-10-CM | POA: Diagnosis present

## 2014-05-14 DIAGNOSIS — D649 Anemia, unspecified: Secondary | ICD-10-CM | POA: Diagnosis present

## 2014-05-14 DIAGNOSIS — I10 Essential (primary) hypertension: Secondary | ICD-10-CM | POA: Diagnosis present

## 2014-05-14 DIAGNOSIS — R32 Unspecified urinary incontinence: Secondary | ICD-10-CM | POA: Diagnosis present

## 2014-05-14 DIAGNOSIS — R5381 Other malaise: Secondary | ICD-10-CM

## 2014-05-14 DIAGNOSIS — K319 Disease of stomach and duodenum, unspecified: Secondary | ICD-10-CM | POA: Diagnosis present

## 2014-05-14 DIAGNOSIS — G609 Hereditary and idiopathic neuropathy, unspecified: Secondary | ICD-10-CM | POA: Diagnosis present

## 2014-05-14 DIAGNOSIS — K7460 Unspecified cirrhosis of liver: Secondary | ICD-10-CM | POA: Diagnosis not present

## 2014-05-14 DIAGNOSIS — M545 Low back pain, unspecified: Secondary | ICD-10-CM | POA: Diagnosis present

## 2014-05-14 DIAGNOSIS — G9340 Encephalopathy, unspecified: Secondary | ICD-10-CM

## 2014-05-14 DIAGNOSIS — R195 Other fecal abnormalities: Secondary | ICD-10-CM | POA: Diagnosis present

## 2014-05-14 DIAGNOSIS — R1031 Right lower quadrant pain: Secondary | ICD-10-CM | POA: Diagnosis present

## 2014-05-14 DIAGNOSIS — K2990 Gastroduodenitis, unspecified, without bleeding: Secondary | ICD-10-CM | POA: Diagnosis present

## 2014-05-14 DIAGNOSIS — K5090 Crohn's disease, unspecified, without complications: Secondary | ICD-10-CM | POA: Diagnosis present

## 2014-05-14 DIAGNOSIS — E119 Type 2 diabetes mellitus without complications: Secondary | ICD-10-CM | POA: Diagnosis not present

## 2014-05-14 DIAGNOSIS — G8929 Other chronic pain: Secondary | ICD-10-CM | POA: Diagnosis present

## 2014-05-14 DIAGNOSIS — F411 Generalized anxiety disorder: Secondary | ICD-10-CM | POA: Diagnosis present

## 2014-05-14 DIAGNOSIS — R109 Unspecified abdominal pain: Secondary | ICD-10-CM | POA: Diagnosis present

## 2014-05-14 DIAGNOSIS — Z79899 Other long term (current) drug therapy: Secondary | ICD-10-CM | POA: Diagnosis not present

## 2014-05-14 DIAGNOSIS — G629 Polyneuropathy, unspecified: Secondary | ICD-10-CM | POA: Diagnosis present

## 2014-05-14 DIAGNOSIS — E860 Dehydration: Secondary | ICD-10-CM | POA: Diagnosis present

## 2014-05-14 DIAGNOSIS — Z8719 Personal history of other diseases of the digestive system: Secondary | ICD-10-CM

## 2014-05-14 DIAGNOSIS — E43 Unspecified severe protein-calorie malnutrition: Secondary | ICD-10-CM | POA: Diagnosis present

## 2014-05-14 DIAGNOSIS — Z880 Allergy status to penicillin: Secondary | ICD-10-CM

## 2014-05-14 DIAGNOSIS — G589 Mononeuropathy, unspecified: Secondary | ICD-10-CM | POA: Diagnosis present

## 2014-05-14 DIAGNOSIS — F17200 Nicotine dependence, unspecified, uncomplicated: Secondary | ICD-10-CM | POA: Diagnosis present

## 2014-05-14 DIAGNOSIS — B1920 Unspecified viral hepatitis C without hepatic coma: Secondary | ICD-10-CM | POA: Diagnosis present

## 2014-05-14 DIAGNOSIS — Z86711 Personal history of pulmonary embolism: Secondary | ICD-10-CM | POA: Diagnosis present

## 2014-05-14 DIAGNOSIS — I951 Orthostatic hypotension: Secondary | ICD-10-CM | POA: Diagnosis present

## 2014-05-14 DIAGNOSIS — K859 Acute pancreatitis without necrosis or infection, unspecified: Secondary | ICD-10-CM | POA: Diagnosis present

## 2014-05-14 DIAGNOSIS — E876 Hypokalemia: Secondary | ICD-10-CM | POA: Diagnosis present

## 2014-05-14 DIAGNOSIS — R6510 Systemic inflammatory response syndrome (SIRS) of non-infectious origin without acute organ dysfunction: Secondary | ICD-10-CM | POA: Diagnosis present

## 2014-05-14 DIAGNOSIS — D500 Iron deficiency anemia secondary to blood loss (chronic): Secondary | ICD-10-CM | POA: Diagnosis not present

## 2014-05-14 DIAGNOSIS — G4700 Insomnia, unspecified: Secondary | ICD-10-CM | POA: Diagnosis present

## 2014-05-14 DIAGNOSIS — I2699 Other pulmonary embolism without acute cor pulmonale: Secondary | ICD-10-CM | POA: Diagnosis present

## 2014-05-14 DIAGNOSIS — F3289 Other specified depressive episodes: Secondary | ICD-10-CM | POA: Diagnosis present

## 2014-05-14 DIAGNOSIS — Z5189 Encounter for other specified aftercare: Principal | ICD-10-CM

## 2014-05-14 DIAGNOSIS — K50918 Crohn's disease, unspecified, with other complication: Secondary | ICD-10-CM

## 2014-05-14 DIAGNOSIS — F329 Major depressive disorder, single episode, unspecified: Secondary | ICD-10-CM | POA: Diagnosis present

## 2014-05-14 DIAGNOSIS — K2970 Gastritis, unspecified, without bleeding: Secondary | ICD-10-CM | POA: Diagnosis present

## 2014-05-14 DIAGNOSIS — Z888 Allergy status to other drugs, medicaments and biological substances status: Secondary | ICD-10-CM | POA: Diagnosis not present

## 2014-05-14 DIAGNOSIS — R630 Anorexia: Secondary | ICD-10-CM | POA: Diagnosis present

## 2014-05-14 DIAGNOSIS — E46 Unspecified protein-calorie malnutrition: Secondary | ICD-10-CM | POA: Diagnosis present

## 2014-05-14 DIAGNOSIS — K861 Other chronic pancreatitis: Secondary | ICD-10-CM | POA: Diagnosis present

## 2014-05-14 DIAGNOSIS — K921 Melena: Secondary | ICD-10-CM | POA: Diagnosis not present

## 2014-05-14 DIAGNOSIS — D62 Acute posthemorrhagic anemia: Secondary | ICD-10-CM | POA: Diagnosis not present

## 2014-05-14 LAB — CULTURE, BLOOD (ROUTINE X 2)
Culture: NO GROWTH
Culture: NO GROWTH

## 2014-05-14 LAB — CBC
HEMATOCRIT: 27.4 % — AB (ref 36.0–46.0)
Hemoglobin: 8.8 g/dL — ABNORMAL LOW (ref 12.0–15.0)
MCH: 30.9 pg (ref 26.0–34.0)
MCHC: 32.1 g/dL (ref 30.0–36.0)
MCV: 96.1 fL (ref 78.0–100.0)
Platelets: 298 10*3/uL (ref 150–400)
RBC: 2.85 MIL/uL — ABNORMAL LOW (ref 3.87–5.11)
RDW: 17.6 % — AB (ref 11.5–15.5)
WBC: 15.4 10*3/uL — ABNORMAL HIGH (ref 4.0–10.5)

## 2014-05-14 LAB — COMPREHENSIVE METABOLIC PANEL
ALBUMIN: 1.5 g/dL — AB (ref 3.5–5.2)
ALT: 16 U/L (ref 0–35)
ANION GAP: 10 (ref 5–15)
AST: 21 U/L (ref 0–37)
Alkaline Phosphatase: 104 U/L (ref 39–117)
BUN: 4 mg/dL — AB (ref 6–23)
CO2: 18 mEq/L — ABNORMAL LOW (ref 19–32)
CREATININE: 0.6 mg/dL (ref 0.50–1.10)
Calcium: 6.8 mg/dL — ABNORMAL LOW (ref 8.4–10.5)
Chloride: 119 mEq/L — ABNORMAL HIGH (ref 96–112)
GFR calc Af Amer: >90 mL/min (ref >90)
GFR calc non Af Amer: >90 mL/min (ref >90)
Glucose, Bld: 75 mg/dL (ref 70–99)
Potassium: 3.6 mEq/L — ABNORMAL LOW (ref 3.7–5.3)
Sodium: 147 mEq/L (ref 137–147)
TOTAL PROTEIN: 4.2 g/dL — AB (ref 6.0–8.3)
Total Bilirubin: <0.2 mg/dL — ABNORMAL LOW (ref 0.3–1.2)

## 2014-05-14 LAB — GLUCOSE, CAPILLARY
Glucose-Capillary: 69 mg/dL — ABNORMAL LOW (ref 70–99)
Glucose-Capillary: 103 mg/dL — ABNORMAL HIGH (ref 70–99)
Glucose-Capillary: 132 mg/dL — ABNORMAL HIGH (ref 70–99)

## 2014-05-14 LAB — MAGNESIUM: MAGNESIUM: 0.6 mg/dL — AB (ref 1.5–2.5)

## 2014-05-14 MED ORDER — FOLIC ACID 1 MG PO TABS
1.0000 mg | ORAL_TABLET | Freq: Every day | ORAL | Status: DC
  Administered 2014-05-15 – 2014-05-20 (×6): 1 mg via ORAL
  Filled 2014-05-14 (×9): qty 1

## 2014-05-14 MED ORDER — APIXABAN 5 MG PO TABS
10.0000 mg | ORAL_TABLET | Freq: Two times a day (BID) | ORAL | Status: DC
Start: 2014-05-14 — End: 2014-07-26

## 2014-05-14 MED ORDER — LORATADINE 10 MG PO TABS
10.0000 mg | ORAL_TABLET | Freq: Every day | ORAL | Status: DC
  Administered 2014-05-15 – 2014-05-21 (×7): 10 mg via ORAL
  Filled 2014-05-14 (×9): qty 1

## 2014-05-14 MED ORDER — APIXABAN 5 MG PO TABS: 5.0000 mg | ORAL_TABLET | Freq: Two times a day (BID) | ORAL | Status: DC

## 2014-05-14 MED ORDER — ONDANSETRON HCL 4 MG/2ML IJ SOLN
4.0000 mg | Freq: Four times a day (QID) | INTRAMUSCULAR | Status: DC | PRN
  Administered 2014-05-14 – 2014-05-17 (×5): 4 mg via INTRAVENOUS
  Filled 2014-05-14 (×6): qty 2

## 2014-05-14 MED ORDER — APIXABAN 5 MG PO TABS
5.0000 mg | ORAL_TABLET | Freq: Two times a day (BID) | ORAL | Status: DC
  Administered 2014-05-19 – 2014-05-21 (×5): 5 mg via ORAL
  Filled 2014-05-14 (×8): qty 1

## 2014-05-14 MED ORDER — GABAPENTIN 600 MG PO TABS
600.0000 mg | ORAL_TABLET | ORAL | Status: DC
  Administered 2014-05-15 – 2014-05-21 (×7): 600 mg via ORAL
  Filled 2014-05-14 (×7): qty 1

## 2014-05-14 MED ORDER — APIXABAN 5 MG PO TABS: 10.0000 mg | ORAL_TABLET | Freq: Two times a day (BID) | ORAL | Status: DC

## 2014-05-14 MED ORDER — HYDROMORPHONE HCL 2 MG PO TABS: 8.0000 mg | ORAL_TABLET | Freq: Two times a day (BID) | ORAL | Status: DC | PRN

## 2014-05-14 MED ORDER — CALCIUM CARBONATE ANTACID 500 MG PO CHEW
2.0000 | CHEWABLE_TABLET | Freq: Three times a day (TID) | ORAL | Status: DC
  Administered 2014-05-14 – 2014-05-21 (×21): 400 mg via ORAL
  Filled 2014-05-14 (×24): qty 2

## 2014-05-14 MED ORDER — APIXABAN 5 MG PO TABS
10.0000 mg | ORAL_TABLET | Freq: Two times a day (BID) | ORAL | Status: AC
  Administered 2014-05-14 – 2014-05-18 (×9): 10 mg via ORAL
  Filled 2014-05-14 (×9): qty 2

## 2014-05-14 MED ORDER — ONDANSETRON HCL 4 MG PO TABS
4.0000 mg | ORAL_TABLET | Freq: Four times a day (QID) | ORAL | Status: DC | PRN
  Administered 2014-05-18 – 2014-05-19 (×2): 4 mg via ORAL
  Filled 2014-05-14 (×2): qty 1

## 2014-05-14 MED ORDER — ENSURE PUDDING PO PUDG
1.0000 | Freq: Three times a day (TID) | ORAL | Status: DC
  Administered 2014-05-14 – 2014-05-21 (×18): 1 via ORAL

## 2014-05-14 MED ORDER — ALPRAZOLAM 0.5 MG PO TABS
1.0000 mg | ORAL_TABLET | Freq: Three times a day (TID) | ORAL | Status: DC | PRN
  Administered 2014-05-14 – 2014-05-15 (×3): 2 mg via ORAL
  Administered 2014-05-15: 1 mg via ORAL
  Administered 2014-05-16 – 2014-05-17 (×3): 2 mg via ORAL
  Filled 2014-05-14 (×2): qty 4
  Filled 2014-05-14: qty 2
  Filled 2014-05-14 (×5): qty 4

## 2014-05-14 MED ORDER — BISACODYL 10 MG RE SUPP
10.0000 mg | Freq: Every day | RECTAL | Status: DC | PRN
Start: 2014-05-14 — End: 2014-05-21

## 2014-05-14 MED ORDER — LOPERAMIDE HCL 2 MG PO CAPS: 2.0000 mg | ORAL_CAPSULE | Freq: Three times a day (TID) | ORAL | Status: DC | PRN

## 2014-05-14 MED ORDER — SULFASALAZINE 500 MG PO TABS
1000.0000 mg | ORAL_TABLET | Freq: Every day | ORAL | Status: DC
  Administered 2014-05-15 – 2014-05-21 (×7): 1000 mg via ORAL
  Filled 2014-05-14 (×9): qty 2

## 2014-05-14 MED ORDER — ALUM & MAG HYDROXIDE-SIMETH 200-200-20 MG/5ML PO SUSP
30.0000 mL | Freq: Four times a day (QID) | ORAL | Status: DC | PRN
Start: 2014-05-14 — End: 2014-05-14

## 2014-05-14 MED ORDER — LOPERAMIDE HCL 2 MG PO CAPS
2.0000 mg | ORAL_CAPSULE | ORAL | Status: DC | PRN
  Filled 2014-05-14: qty 1

## 2014-05-14 MED ORDER — SENNOSIDES-DOCUSATE SODIUM 8.6-50 MG PO TABS
1.0000 | ORAL_TABLET | Freq: Every evening | ORAL | Status: DC | PRN
Start: 2014-05-14 — End: 2014-05-21
  Administered 2014-05-17: 1 via ORAL

## 2014-05-14 MED ORDER — GABAPENTIN 400 MG PO CAPS: 400.0000 mg | ORAL_CAPSULE | Freq: Three times a day (TID) | ORAL | Status: DC

## 2014-05-14 MED ORDER — MAGNESIUM SULFATE 40 MG/ML IJ SOLN
2.0000 g | Freq: Once | INTRAMUSCULAR | Status: AC
  Administered 2014-05-14: 2 g via INTRAVENOUS
  Filled 2014-05-14: qty 50

## 2014-05-14 MED ORDER — GABAPENTIN 400 MG PO CAPS
400.0000 mg | ORAL_CAPSULE | Freq: Two times a day (BID) | ORAL | Status: DC
  Administered 2014-05-14 – 2014-05-21 (×14): 400 mg via ORAL
  Filled 2014-05-14 (×17): qty 1

## 2014-05-14 MED ORDER — AMITRIPTYLINE HCL 50 MG PO TABS: 50.0000 mg | ORAL_TABLET | Freq: Every evening | ORAL | Status: DC | PRN

## 2014-05-14 MED ORDER — PANTOPRAZOLE SODIUM 40 MG PO TBEC
40.0000 mg | DELAYED_RELEASE_TABLET | Freq: Every day | ORAL | Status: DC
  Administered 2014-05-15 – 2014-05-21 (×7): 40 mg via ORAL
  Filled 2014-05-14 (×6): qty 1

## 2014-05-14 MED ORDER — HYDROCODONE-ACETAMINOPHEN 10-325 MG PO TABS
1.0000 | ORAL_TABLET | Freq: Four times a day (QID) | ORAL | Status: DC | PRN
  Administered 2014-05-14 – 2014-05-17 (×5): 1 via ORAL
  Filled 2014-05-14 (×5): qty 1

## 2014-05-14 MED ORDER — ALUM & MAG HYDROXIDE-SIMETH 200-200-20 MG/5ML PO SUSP: 30.0000 mL | ORAL | Status: DC | PRN

## 2014-05-14 MED ORDER — CETYLPYRIDINIUM CHLORIDE 0.05 % MT LIQD
7.0000 mL | Freq: Two times a day (BID) | OROMUCOSAL | Status: DC
  Administered 2014-05-14 – 2014-05-20 (×10): 7 mL via OROMUCOSAL

## 2014-05-14 MED ORDER — GUAIFENESIN-DM 100-10 MG/5ML PO SYRP: 5.0000 mL | ORAL_SOLUTION | Freq: Four times a day (QID) | ORAL | Status: DC | PRN

## 2014-05-14 MED ORDER — MAGNESIUM OXIDE 400 (241.3 MG) MG PO TABS
400.0000 mg | ORAL_TABLET | Freq: Two times a day (BID) | ORAL | Status: DC
  Administered 2014-05-15 – 2014-05-20 (×12): 400 mg via ORAL
  Filled 2014-05-14 (×16): qty 1

## 2014-05-14 MED ORDER — PANTOPRAZOLE SODIUM 20 MG PO TBEC: 40.0000 mg | DELAYED_RELEASE_TABLET | Freq: Every day | ORAL | Status: DC

## 2014-05-14 MED ORDER — TRAZODONE HCL 50 MG PO TABS
50.0000 mg | ORAL_TABLET | Freq: Every day | ORAL | Status: DC
  Administered 2014-05-14 – 2014-05-20 (×7): 50 mg via ORAL
  Filled 2014-05-14 (×6): qty 1

## 2014-05-14 MED ORDER — POTASSIUM CHLORIDE CRYS ER 10 MEQ PO TBCR
10.0000 meq | EXTENDED_RELEASE_TABLET | Freq: Two times a day (BID) | ORAL | Status: DC
  Administered 2014-05-14 – 2014-05-17 (×6): 10 meq via ORAL
  Filled 2014-05-14 (×8): qty 1

## 2014-05-14 MED ORDER — VENLAFAXINE HCL 25 MG PO TABS
25.0000 mg | ORAL_TABLET | Freq: Two times a day (BID) | ORAL | Status: DC
  Administered 2014-05-14 – 2014-05-21 (×15): 25 mg via ORAL
  Filled 2014-05-14 (×17): qty 1

## 2014-05-14 MED ORDER — FLEET ENEMA 7-19 GM/118ML RE ENEM: 1.0000 | ENEMA | Freq: Once | RECTAL | Status: AC | PRN

## 2014-05-14 NOTE — PMR Pre-admission (Signed)
PMR Admission Coordinator Pre-Admission Assessment  Patient: [name] is an 53 y.o., female MRN: [id number] DOB: 29-Apr-1961 Height: 5\' 6"  (167.6 cm) Weight: 39 kg (85 lb 15.7 oz)              Insurance Information HMO:     PPO:      PCP:      IPA:      80/20: yes     OTHER: no HMO PRIMARY: medicare a and b      Policy#: [id number]      Subscriber: pt Benefits:  Phone #: online [email or web address]     Name: 05/14/14 Eff. Date: 03/02/11     Deduct: $1260      Out of Pocket Max: none      Life Max: none CIR: 100%      SNF: 20 full days Outpatient: 80%     Co-Pay: 20% Home Health: 100%      Co-Pay: none DME: 80%     Co-Pay: 20% Providers: pt choice  SECONDARY: Medicaid [place] Access      Policy#: [id number]      Subscriber: pt  Medicaid Application Date:       Case Manager:  Disability Application Date:       Case Worker:   Emergency Contact Information Contact Information   Name Relation Home Work Mobile   [place] Son [phone]     Marquita Palms [phone]  [phone]     Current Medical History  Patient Admitting Diagnosis:Deconditioning secondary to systemic inflammatory response Syndrome, in a patient with chronic malnutrition  History of Present Illness: [name] is a 53 y.o. 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   She was admitted on 05/07/14 for work up and found to be hypotensive, tachycardic with elevated WBC and lactic acidosis. She was dehydrated and hypoxic and started on IV antibiotics for SIRS. BC X 2 and stools negative. CT head negative for acute changes. CT chest with acute bilateral PE and esophagus distended and fluid filled and lovenox added for treatment and patient wants eliquis for treatment. She was placed on liquid diet and Dr. Rhea Belton consulted  for input. He recommended MRI pancreas as well as MRCP due to h/o chronic pancreatitis. Patient refused MRI study X 2 despite sedation and GI has signed off.  She has had improvement of GI symptoms and tolerated slow advancement of diet. SIRS resolved--unclear source. Syncope likely due to orthostatic hypotension and dehydration. Electrolyte abnormality treated and insulin discontinued due to hypoglycemic episodes--Hgb A1c-4.9.  Patient with anorexia and significant amount of weight loss in the past few months. She reports that she has been getting sick past 2 months with nausea due to multiple medication changes and d/c of antiemetic.  Denies alcohol use since 2004, several urine alcohol toxicology performed over last several years have been negative. Urine toxicology has been positive for opiates in 2012 in 2014    Past Medical History  Past Medical History  Diagnosis Date  . Glaucoma   . Diabetes mellitus without complication   . Hypertension   . Neuropathy   . Crohn disease   . Asthma     Family History  family history is not on file.  Prior Rehab/Hospitalizations: went to [facility] 10/2012 for 3 days. Checked herself out.   Current Medications  Current facility-administered medications:acetaminophen (TYLENOL) suppository 650 mg, 650 mg, Rectal, Q6H PRN, Calvert Cantor, MD;  acetaminophen (TYLENOL) tablet 650 mg, 650 mg, Oral, Q6H PRN, Calvert Cantor, MD;  albuterol (PROVENTIL) (2.5 MG/3ML) 0.083% nebulizer solution 2.5 mg, 2.5 mg, Nebulization, Q4H PRN, Calvert Cantor, MD;  ALPRAZolam Prudy Feeler) tablet 2 mg, 2 mg, Oral, TID PRN, Calvert Cantor, MD, 2 mg at 05/13/14 2333 alum & mag hydroxide-simeth (MAALOX/MYLANTA) 200-200-20 MG/5ML suspension 30 mL, 30 mL, Oral, Q6H PRN, Calvert Cantor, MD;  amitriptyline (ELAVIL) tablet 50 mg, 50 mg, Oral, QHS PRN, Calvert Cantor, MD;  antiseptic oral rinse (CPC / CETYLPYRIDINIUM CHLORIDE 0.05%) solution 7 mL, 7 mL, Mouth Rinse, BID, Calvert Cantor, MD, 7 mL at  05/14/14 0957;  apixaban (ELIQUIS) tablet 10 mg, 10 mg, Oral, BID, Zannie Cove, MD, 10 mg at 05/14/14 0955 [START ON 05/19/2014] apixaban (ELIQUIS) tablet 5 mg, 5 mg, Oral, BID, Zannie Cove, MD;  calcium carbonate (TUMS - dosed in mg elemental calcium) chewable tablet 400 mg of elemental calcium, 2 tablet, Oral, TID, Zannie Cove, MD, 400 mg of elemental calcium at 05/14/14 0956;  feeding supplement (ENSURE) (ENSURE) pudding 1 Container, 1 Container, Oral, TID BM, Ashley Jacobs, RD, 1 Container at 05/14/14 0454 folic acid (FOLVITE) tablet 1 mg, 1 mg, Oral, Daily, Calvert Cantor, MD, 1 mg at 05/14/14 0956;  gabapentin (NEURONTIN) capsule 400 mg, 400 mg, Oral, BID, Zannie Cove, MD, 400 mg at 05/14/14 0981;  gabapentin (NEURONTIN) tablet 600 mg, 600 mg, Oral, Q24H, Zannie Cove, MD;  HYDROcodone-acetaminophen (NORCO) 10-325 MG per tablet 1 tablet, 1 tablet, Oral, Q6H PRN, Calvert Cantor, MD, 1 tablet at 05/13/14 2045 HYDROmorphone (DILAUDID) tablet 8 mg, 8 mg, Oral, BID PRN, Calvert Cantor, MD, 8 mg at 05/14/14 0849;  loperamide (IMODIUM) capsule 2 mg, 2 mg, Oral, PRN, Kela Millin, MD, 2 mg at 05/12/14 0434;  loratadine (CLARITIN) tablet 10 mg, 10 mg, Oral, Daily, Calvert Cantor, MD, 10 mg at 05/14/14 0956;  LORazepam (ATIVAN) injection 1 mg, 1 mg, Intravenous, Once, Zannie Cove, MD magnesium sulfate IVPB 2 g 50 mL, 2 g, Intravenous, Once, Zannie Cove, MD, 2 g at 05/14/14 1316;  ondansetron (ZOFRAN) injection 4 mg, 4 mg, Intravenous, Q6H PRN, Calvert Cantor, MD, 4 mg at 05/08/14 1100;  ondansetron (ZOFRAN) injection 4 mg, 4 mg, Intravenous, Q6H, Saima Rizwan, MD, 4 mg at 05/14/14 1213;  ondansetron (ZOFRAN) tablet 4 mg, 4 mg, Oral, Q6H PRN, Calvert Cantor, MD, 4 mg at 05/08/14 1726 pantoprazole (PROTONIX) EC tablet 40 mg, 40 mg, Oral, Daily, Hilario Quarry Amend, RPH, 40 mg at 05/14/14 0955;  promethazine (PHENERGAN) injection 12.5 mg, 12.5 mg, Intravenous, Q6H PRN, Calvert Cantor, MD;  sodium chloride  0.9 % bolus 1,000 mL, 1,000 mL, Intravenous, PRN, Calvert Cantor, MD;  sodium chloride 0.9 % injection 3 mL, 3 mL, Intravenous, Q12H, Saima Rizwan, MD, 3 mL at 05/14/14 0957 sulfaSALAzine (AZULFIDINE) tablet 1,000 mg, 1,000 mg, Oral, Q breakfast, Calvert Cantor, MD, 1,000 mg at 05/14/14 1914;  traZODone (DESYREL) tablet 50 mg, 50 mg, Oral, QHS, Saima Rizwan, MD, 50 mg at 05/13/14 2317;  venlafaxine [facility]) tablet 25 mg, 25 mg, Oral, BID WC, Calvert Cantor, MD, 25 mg at 05/14/14 7829  Patients Current Diet: General. Pt with significant weight loss over last few years. She minimizes it. Reportedly eats one meal a day.  Precautions / Restrictions Precautions Precautions: Fall Precaution Comments: Back precautions secondary to severe back and neck pain with mobility. Restrictions Weight Bearing Restrictions: No   Prior  Activity Level Household: very limited over last 3 months. Uses cane , walker or furniture walks up until 3 to 4 weeks ago. Son carries her up two flights of stairs into their apartment. Sponge bathes herself with PCS aide assist. Her bed is very high bed and she uses stool for entry into bed.   Home Assistive Devices / Equipment Home Assistive Devices/Equipment: Bedside commode/3-in-1;Cane (specify quad or straight);CBG Meter;Other (Comment) (cbg meter has been broken for one month) Home Equipment: Walker - 2 wheels;Cane - single point;Bedside commode CBG monitor is broken  Prior Functional Level Prior Function Level of Independence: Needs assistance Gait / Transfers Assistance Needed: Pt reports being nonambulatory for ~1 month. Finished HHPT in June and since then has had a recent decline in mobility. Increased falls when ambulating with RW. ADL's / Homemaking Assistance Needed: Pt reports using diapers recently. Assistance helps with ADLs as requested. Pt does not like to ask for help. Pt reports she has not been eating recently. Comments: bedbound for 3 to 4 weeks, high bed with  stool to get up . furniture walke to bathroom when able. Gets her PCS aide to put everything in reach   Current Functional Level Cognition  Overall Cognitive Status: Within Functional Limits for tasks assessed Orientation Level: Oriented X4    Extremity Assessment (includes Sensation/Coordination)          ADLs       Mobility  Overal bed mobility: Needs Assistance Bed Mobility: Rolling;Sidelying to Sit;Sit to Sidelying Rolling: Modified independent (Device/Increase time) Sidelying to sit: Mod assist;HOB elevated Sit to sidelying: Min assist;HOB elevated General bed mobility comments: Rolling to R/L x3 with rail for support; Sidelying to sit x2 with UEs manually mobilizing BLEs to EOB, assist with trunk and scooting bottom to EOB. Sit to sidelying x2 with pt manually lifting BLEs into bed. VC for technique. Assist with BLEs on second transfer to bed.     Transfers  Overall transfer level: Needs assistance Equipment used: Rolling walker (2 wheeled) Transfers: Sit to/from Visteon CorporationStand;Squat Pivot Transfers Sit to Stand: Mod assist Squat pivot transfers: Min assist General transfer comment: Stood from EOB x1 with therapist stabilizing B feet from sliding forward, VC for hand placement and assist with anterior translation/hip extension. Increased tremoring BUEs/LEs and trunk upon standing Mod A for balance. SPT bed <->BSC with VC for hand placement/technique,    Ambulation / Gait / Stairs / Wheelchair Mobility  Ambulation/Gait General Gait Details: TBA.    Posture / Balance Dynamic Sitting Balance Sitting balance - Comments: Requires BUEs support sitting EOB to maintain balance, occasional LOB posteriorly and to the left with dynamic activities when not supported. Increased tremoring/shaking (ataxia?) noted EOB in trunk and BUEs/LEs. Able to reduce tremoring with approximation inconsistently. Min guard assist -Min A for support EOB.    Special needs/care consideration Continuous Drip IV KVO  for receiving IV meds Bowel mgmt: using diapers pta Bladder mgmt:using diapers pta. Do not know if this is due to decrease in functional mobility or incontinent Diabetic mgmt yes. Meter broken for one month   Previous Home Environment Living Arrangements: Children;Other (Comment) 63(19 yo son live with pt)  Lives With: Son Available Help at Discharge: Other (Comment);Family (son and PCS aide, not 24/7 assist) Type of Home: Apartment Home Layout: One level Home Access: Stairs to enter Entrance Stairs-Rails: Right;Left Entrance Stairs-Number of Steps: 2 flights of steps with landing. Bathroom Shower/Tub: Associate ProfessorTub/shower unit Bathroom Toilet: Standard Bathroom Accessibility: Yes How Accessible: Accessible via walker Home  Care Services: Yes Type of Home Care Services: Homehealth aide Additional Comments:  (son 14 yo unemployed, but not home much.PCS aide 3 hrs per d)  Discharge Living Setting Plans for Discharge Living Setting: Apartment Type of Home at Discharge: Apartment Discharge Home Layout: One level Discharge Home Access: Stairs to enter Entrance Stairs-Rails: Right;Left;Can reach both Entrance Stairs-Number of Steps: 2 flights with a landing Discharge Bathroom Shower/Tub: Tub/shower unit Discharge Bathroom Toilet: Standard Discharge Bathroom Accessibility: Yes How Accessible: Accessible via walker Does the patient have any problems obtaining your medications?: No  Social/Family/Support Systems Patient Roles: Parent Contact Information: [employer], 40 yo son Anticipated Caregiver: son and PCS aide Anticipated Industrial/product designer Information: see above Ability/Limitations of Caregiver: son unemployed but in and out Caregiver Availability: Intermittent Discharge Plan Discussed with Primary Caregiver: Yes Is Caregiver In Agreement with Plan?: Yes Does Caregiver/Family have Issues with Lodging/Transportation while Pt is in Rehab?: No  Goals/Additional Needs Patient/Family  Goal for Rehab: MOd I short distances household, mod I to supervision with ADLS Expected length of stay: ELOS 7 to 10 days Equipment Needs: home CBG meter broken for one month Special Service Needs: Pt requesting assist to obtain new PCP. Pt has been on [facility] waiting list for four years with [facility] agency, [place].Bonita Quin PCS aide for 3 months. Before it was her nephew who lives next door, but they parted ways as her caregiver. Son does not have transportation, so d/c home transportation will be needed Pt/Family Agrees to Admission and willing to participate: Yes Program Orientation Provided & Reviewed with Pt/Caregiver Including Roles  & Responsibilities: Yes  Decrease burden of Care through IP rehab admission: n/a  Possible need for SNF placement upon discharge:no  Patient Condition: This patient's condition remains as documented in the consult dated 05/14/14, in which the Rehabilitation Physician determined and documented that the patient's condition is appropriate for intensive rehabilitative care in an inpatient rehabilitation facility. Will admit to inpatient rehab today.  Preadmission Screen Completed By:  Clois Dupes, 05/14/2014 2:00 PM ______________________________________________________________________   Discussed status with Dr. Wynn Banker on 05/14/14 at  1359 and received telephone approval for admission today.  Admission Coordinator:  Clois Dupes, time 0712 Date 05/14/14.

## 2014-05-14 NOTE — Progress Notes (Signed)
Physical Medicine and Rehabilitation Consult   Reason for Consult: Deconditioning in patient with SIRS and FTT Referring Physician: Dr. Jomarie Longs     HPI: [name] is a 53 y.o. 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 She was admitted on 05/07/14 for work up and found to be hypotensive, tachycardic with elevated WBC and lactic acidosis. She was dehydrated and hypoxic and started on IV antibiotics for SIRS. BC X 2 and stools negative. CT head negative for acute changes. CT chest with acute bilateral PE and esophagus distended and fluid filled and lovenox added for treatment and patient wants eliquis for treatment. She was placed on liquid diet and  Dr. Rhea Belton consulted for input. He recommended MRI pancreas as well as MRCP due to h/o chronic pancreatitis. Patient refused MRI study X 2  despite sedation and GI has signed off.      She has had improvement of GI symptoms and tolerated slow advancement of diet. SIRS resolved--unclear source. Syncope likely due to orthostatic hypotension and dehydration. Electrolyte abnormality treated and insulin discontinued due to hypoglycemic episodes--Hgb A1c-4.9. PT evaluation done yesterday and patient significantly deconditioned with tremors as well as ataxia with attempts at sitting at EOB.    Patient with anorexia and significant amount of weight loss in the past few months. She reports that she has been getting sick past 2 months with nausea due to multiple medication changes and d/c of antiemetic. Stays in the room most of the day and sponge bathes few days a week.  Has an aide 4 hrs/5 days and 45 mins on the weekend to help with housework,  Meal prep and ADLs. She could transfer independently and ambulate short distances to BR on good days.    Patient states  she has been told that she has diabetic neuropathy as well as arthritis. Denies alcohol use since 2004, several urine alcohol toxicology performed over last several years have been negative. Urine toxicology has been positive for opiates in 2012 in 2014 Review of Systems  Constitutional: Positive for weight loss and malaise/fatigue.  HENT: Negative for hearing loss.   Respiratory: Negative for shortness of breath.   Cardiovascular: Negative for chest pain and palpitations.  Gastrointestinal: Positive for abdominal pain. Negative for nausea and vomiting.  Musculoskeletal: Positive for back pain, myalgias and neck pain.  Neurological: Positive for weakness. Negative for headaches.  Psychiatric/Behavioral: The patient is nervous/anxious.      Past Medical History   Diagnosis  Date   .  Glaucoma     .  Diabetes mellitus without complication     .  Hypertension     .  Neuropathy     .  Crohn disease     .  Asthma      Past Surgical History   Procedure  Laterality  Date   .  Cesarean section        No family history on file.   Social History:  Lives with family. Disabled since 2004 due to back issues. Used to work as a Lawyer. Independent prior to current illness.  She reports that she has been smoking.  She does not have any smokeless tobacco history on file. She reports that she drinks alcohol. She reports that she does not use illicit drugs.      Allergies  Allergen  Reactions   .  Iohexol  Other (See Comments)       "severe burning" Patient has received Contrast in 2005 with 13 hour pre-medication, and had no reaction at that time   .  Penicillins  Hives       Medications Prior to Admission   Medication  Sig  Dispense  Refill   .  alprazolam (XANAX) 2 MG tablet  Take 2 mg by mouth 3 (three) times daily as needed for anxiety.         Marland Kitchen  amitriptyline (ELAVIL) 50 MG tablet  Take 1 tablet (50 mg total) by mouth daily.   30 tablet   0   .  cetirizine (ZYRTEC) 10 MG tablet  Take 10  mg by mouth every morning.         .  folic acid (FOLVITE) 1 MG tablet  Take 1 tablet (1 mg total) by mouth daily.   30 tablet   0   .  gabapentin (NEURONTIN) 300 MG capsule  Take 1,200-1,500 mg by mouth 3 (three) times daily.          Marland Kitchen  glimepiride (AMARYL) 2 MG tablet  Take 2 mg by mouth daily as needed (diet).          Marland Kitchen  HYDROcodone-acetaminophen (NORCO) 10-325 MG per tablet  Take 1 tablet by mouth every 6 (six) hours as needed for moderate pain.         Marland Kitchen  HYDROmorphone (DILAUDID) 8 MG tablet  Take 8 mg by mouth 2 (two) times daily as needed for severe pain.         Marland Kitchen  lisinopril (PRINIVIL,ZESTRIL) 5 MG tablet  Take 5 mg by mouth at bedtime.          .  meclizine (ANTIVERT) 25 MG tablet  Take 25 mg by mouth 3 (three) times daily as needed for dizziness.          .  methocarbamol (ROBAXIN) 750 MG tablet  Take 1 tablet (750 mg total) by mouth 2 (two) times daily as needed. For muscle spasms   30 tablet   0   .  ondansetron (ZOFRAN) 4 MG tablet  Take 4 mg by mouth every 8 (eight) hours as needed for nausea.         .  pantoprazole (PROTONIX) 20 MG tablet  Take 40 mg by mouth 2 (two) times daily.         .  polyethylene glycol (MIRALAX / GLYCOLAX) packet  Take 17 g by mouth daily.         .  promethazine (PHENERGAN) 25 MG tablet  Take 25 mg by mouth 2 (two) times daily as needed. For nausea         .  simvastatin (ZOCOR) 40 MG tablet  Take 40 mg by mouth at bedtime.          .  sulfaSALAzine (AZULFIDINE) 500 MG tablet  Take 1,000 mg by mouth daily.          Marland Kitchen  thiamine 100 MG tablet  Take 1 tablet (100 mg total) by mouth daily.   30 tablet   0   .  tiotropium (SPIRIVA) 18 MCG inhalation capsule  Place 18 mcg into inhaler and inhale daily.         .  traZODone (DESYREL) 50 MG tablet  Take 50 mg by mouth daily.           Marland Kitchen  venlafaxine (EFFEXOR) 25 MG tablet  Take 25 mg by mouth 2 (two) times daily.            Home: Home Living Family/patient expects to be discharged to:: Private  residence Living Arrangements: Children (55 y/o female.) Available Help at Discharge: Family;Available PRN/intermittently;Personal care attendant Type of Home: Apartment Home Access: Stairs to enter Entrance Stairs-Number of Steps: 2 flights of steps with landing. Entrance Stairs-Rails: Right;Left Home Layout: One level Home Equipment: Walker - 2 wheels;Cane - single point;Bedside commode Additional Comments: Has 53 y/o son who is not home much. Pt reports have "an assistant" who comes in for 4 hrs/day.   Functional History: Prior Function Level of Independence: Needs assistance Gait / Transfers Assistance Needed: Pt reports being nonambulatory for ~1 month. Finished HHPT in June and since then has had a recent decline in mobility. Increased falls when ambulating with RW. ADL's / Homemaking Assistance Needed: Pt reports using diapers recently. Assistance helps with ADLs as requested. Pt does not like to ask for help. Pt reports she has not been eating recently. Functional Status:   Mobility: Bed Mobility Overal bed mobility: Needs Assistance Bed Mobility: Rolling;Sidelying to Sit;Sit to Sidelying Rolling: Modified independent (Device/Increase time) Sidelying to sit: Mod assist;HOB elevated Sit to sidelying: Min assist;HOB elevated General bed mobility comments: Rolling to R/L x3 with rail for support; Sidelying to sit x2 with UEs manually mobilizing BLEs to EOB, assist with trunk and scooting bottom to EOB. Sit to sidelying x2 with pt manually lifting BLEs into bed. VC for technique. Assist with BLEs on second transfer to bed.  Transfers Overall transfer level: Needs assistance Equipment used: Rolling walker (2 wheeled) Transfers: Sit to/from [employer] Sit to Stand: Mod assist Squat pivot transfers: Min assist General transfer comment: Stood from EOB x1 with therapist stabilizing B feet from sliding forward, VC for hand placement and assist with anterior translation/hip  extension. Increased tremoring BUEs/LEs and trunk upon standing Mod A for balance. SPT bed <->BSC with VC for hand placement/technique, Ambulation/Gait General Gait Details: TBA.   ADL:   Cognition: Cognition Overall Cognitive Status: Within Functional Limits for tasks assessed Orientation Level: Oriented X4 Cognition Arousal/Alertness: Awake/alert Behavior During Therapy: WFL for tasks assessed/performed Overall Cognitive Status: Within Functional Limits for tasks assessed   Blood pressure 92/68, pulse 120, temperature 98 F (36.7 C), temperature source Oral, resp. rate 20, height 5\' 6"  (1.676 m), weight 39 kg (85 lb 15.7 oz), SpO2 96.00%. Physical Exam  Nursing note and vitals reviewed. Constitutional: She is oriented to person, place, and time. She appears well-developed.  HENT:   Head: Normocephalic and atraumatic.  Eyes: Conjunctivae are normal. Pupils are equal, round, and reactive to light.  Neck: Normal range of motion. Neck supple.  Cardiovascular: Normal rate and regular rhythm.    No murmur heard. Respiratory: Effort normal and breath sounds normal. No respiratory distress. She has no wheezes.  GI: Soft. Bowel sounds are normal. She exhibits no distension. There is no tenderness.  Musculoskeletal: She exhibits no edema.  Significant muscle wasting distally with diffuse weakness. Bilateral foot drop with tight heel cords.   Neurological: She is alert and oriented to person, place, and time.  Speech clear. Verbose and needs redirection due to perseveration. Able to follow commands without difficulty.   Skin: Skin is warm and dry.  Psychiatric: She has a normal mood and affect. Her speech is normal and behavior is normal.  motor strength is 4 minus/5 bilateral deltoid, bicep, tricep,  grip 3 minus bilateral hip flexor knee extensor ankle dorsiflexor plantar flexor Sensation reduced to the elbow right hand to the wrist left hand to pinprick Sensation reduced to pinprick  to the knees bilaterally    Results for orders placed during the hospital encounter of 05/07/14 (from the past 24 hour(s))   TSH     Status: Abnormal     Collection Time      05/13/14  9:00 AM       Result  Value  Ref Range     TSH  7.110 (*)  0.350 - 4.500 uIU/mL   BASIC METABOLIC PANEL     Status: Abnormal     Collection Time      05/13/14  9:00 AM       Result  Value  Ref Range     Sodium  146   137 - 147 mEq/L     Potassium  3.6 (*)  3.7 - 5.3 mEq/L     Chloride  117 (*)  96 - 112 mEq/L     CO2  15 (*)  19 - 32 mEq/L     Glucose, Bld  96   70 - 99 mg/dL     BUN  3 (*)  6 - 23 mg/dL     Creatinine, Ser  4.09   0.50 - 1.10 mg/dL     Calcium  6.0 (*)  8.4 - 10.5 mg/dL     GFR calc non Af Amer  >90   >90 mL/min     GFR calc Af Amer  >90   >90 mL/min     Anion gap  14   5 - 15   CBC     Status: Abnormal     Collection Time      05/13/14  9:00 AM       Result  Value  Ref Range     WBC  15.5 (*)  4.0 - 10.5 K/uL     RBC  3.11 (*)  3.87 - 5.11 MIL/uL     Hemoglobin  9.4 (*)  12.0 - 15.0 g/dL     HCT  81.1 (*)  91.4 - 46.0 %     MCV  93.2   78.0 - 100.0 fL     MCH  30.2   26.0 - 34.0 pg     MCHC  32.4   30.0 - 36.0 g/dL     RDW  78.2 (*)  95.6 - 15.5 %     Platelets  322   150 - 400 K/uL   GLUCOSE, CAPILLARY     Status: Abnormal     Collection Time      05/13/14 11:31 AM       Result  Value  Ref Range     Glucose-Capillary  180 (*)  70 - 99 mg/dL   GLUCOSE, CAPILLARY     Status: Abnormal     Collection Time      05/13/14  5:00 PM       Result  Value  Ref Range     Glucose-Capillary  131 (*)  70 - 99 mg/dL   GLUCOSE, CAPILLARY     Status: Abnormal     Collection Time      05/13/14  9:36 PM       Result  Value  Ref Range     Glucose-Capillary  151 (*)  70 - 99 mg/dL     Comment 1  Notify RN  Comment 2  Documented in Chart      MAGNESIUM     Status: Abnormal     Collection Time      05/14/14  5:21 AM       Result  Value  Ref Range     Magnesium  0.6 (*)  1.5 -  2.5 mg/dL   CBC     Status: Abnormal     Collection Time      05/14/14  5:21 AM       Result  Value  Ref Range     WBC  15.4 (*)  4.0 - 10.5 K/uL     RBC  2.85 (*)  3.87 - 5.11 MIL/uL     Hemoglobin  8.8 (*)  12.0 - 15.0 g/dL     HCT  36.6 (*)  44.0 - 46.0 %     MCV  96.1   78.0 - 100.0 fL     MCH  30.9   26.0 - 34.0 pg     MCHC  32.1   30.0 - 36.0 g/dL     RDW  34.7 (*)  42.5 - 15.5 %     Platelets  298   150 - 400 K/uL   COMPREHENSIVE METABOLIC PANEL     Status: Abnormal     Collection Time      05/14/14  5:21 AM       Result  Value  Ref Range     Sodium  147   137 - 147 mEq/L     Potassium  3.6 (*)  3.7 - 5.3 mEq/L     Chloride  119 (*)  96 - 112 mEq/L     CO2  18 (*)  19 - 32 mEq/L     Glucose, Bld  75   70 - 99 mg/dL     BUN  4 (*)  6 - 23 mg/dL     Creatinine, Ser  9.56   0.50 - 1.10 mg/dL     Calcium  6.8 (*)  8.4 - 10.5 mg/dL     Total Protein  4.2 (*)  6.0 - 8.3 g/dL     Albumin  1.5 (*)  3.5 - 5.2 g/dL     AST  21   0 - 37 U/L     ALT  16   0 - 35 U/L     Alkaline Phosphatase  104   39 - 117 U/L     Total Bilirubin  <0.2 (*)  0.3 - 1.2 mg/dL     GFR calc non Af Amer  >90   >90 mL/min     GFR calc Af Amer  >90   >90 mL/min     Anion gap  10   5 - 15   GLUCOSE, CAPILLARY     Status: Abnormal     Collection Time      05/14/14  7:55 AM       Result  Value  Ref Range     Glucose-Capillary  69 (*)  70 - 99 mg/dL    No results found.   Assessment/Plan: Diagnosis: Deconditioning secondary to systemic inflammatory response Syndrome, in a patient with chronic malnutrition Does the need for close, 24 hr/day medical supervision in concert with the patient's rehab needs make it unreasonable for this patient to be served in a less intensive setting? Yes Co-Morbidities requiring supervision/potential complications: Tachycardia, diabetes, peripheral neuropathy diabetic verses alcoholrelated, history of pancreatitis, history of cirrhosis  of the liver Due to bladder  management, bowel management, safety, skin/wound care, disease management, medication administration, pain management and patient education, does the patient require 24 hr/day rehab nursing? Yes Does the patient require coordinated care of a physician, rehab nurse, PT (1-2 hrs/day, 5 days/week) and OT (1-2 hrs/day, 5 days/week) to address physical and functional deficits in the context of the above medical diagnosis(es)? Yes Addressing deficits in the following areas: balance, endurance, locomotion, strength, transferring, bowel/bladder control, bathing, dressing, feeding, grooming and toileting Can the patient actively participate in an intensive therapy program of at least 3 hrs of therapy per day at least 5 days per week? Yes The potential for patient to make measurable gains while on inpatient rehab is good Anticipated functional outcomes upon discharge from inpatient rehab are modified independent and Short distances household  with PT, modified independent, supervision and ADLs with OT, n/a with SLP. Estimated rehab length of stay to reach the above functional goals is: 7-10 days Does the patient have adequate social supports to accommodate these discharge functional goals? Yes and Potentially Anticipated D/C setting: Home Anticipated post D/C treatments: HH therapy Overall Rehab/Functional Prognosis: good   RECOMMENDATIONS: This patient's condition is appropriate for continued rehabilitative care in the following setting: CIR Patient has agreed to participate in recommended program. Yes Note that insurance prior authorization may be required for reimbursement for recommended care.   Comment:  avoid narcotic analgesics as well as benzodiazepines given history of alcohol abuse       05/14/2014    Revision History...     Date/Time User Action   05/14/2014 10:58 AM Erick ColaceAndrew E Kirsteins, MD Sign   05/14/2014 10:54 AM Erick ColaceAndrew E Kirsteins, MD Share   05/14/2014 10:51 AM Erick ColaceAndrew E  Kirsteins, MD Share   05/14/2014 10:38 AM Jacquelynn CreePamela S Love, PA-C Share  View Details Report   Routing History...     Date/Time From To Method   05/14/2014 10:58 AM Erick ColaceAndrew E Kirsteins, MD Erick ColaceAndrew E Kirsteins, MD In Basket   05/14/2014 10:58 AM Erick ColaceAndrew E Kirsteins, MD Jackie PlumGeorge Osei-Bonsu, MD Fax

## 2014-05-14 NOTE — Progress Notes (Signed)
I met with pt and her PCS aide, Vaughan Basta, at bedside. We discussed a possible inpt rehab admission prior to her return home. She is in agreement to admit. I will contact Dr. Broadus John and arrange for admission today. RN CM is aware. [phone]

## 2014-05-14 NOTE — Progress Notes (Signed)
MEDICATION RELATED CONSULT NOTE - Follow Up  Pharmacy Consult for Calcium replacement Indication: Hypocalcemia  Allergies  Allergen Reactions  . Iohexol Other (See Comments)    "severe burning" Patient has received Contrast in 2005 with 13 hour pre-medication, and had no reaction at that time  . Penicillins Hives    Patient Measurements: Height: 5\' 6"  (167.6 cm) Weight: 85 lb 15.7 oz (39 kg) IBW/kg (Calculated) : 59.3  Vital Signs: Temp: 98 F (36.7 C) (08/14 0539) Temp src: Oral (08/14 0539) BP: 92/68 mmHg (08/14 0539) Pulse Rate: 120 (08/14 0539) Intake/Output from previous day: 08/13 0701 - 08/14 0700 In: 480 [P.O.:480] Out: -  Intake/Output from this shift:    Labs:  Recent Labs  05/11/14 2208 05/12/14 0354 05/13/14 0900 05/14/14 0521  WBC  --   --  15.5* 15.4*  HGB  --   --  9.4* 8.8*  HCT  --   --  29.0* 27.4*  PLT  --   --  322 298  CREATININE  --  0.60 0.59 0.60  MG  --   --   --  0.6*  PHOS 1.7*  --   --   --   ALBUMIN  --  1.5*  --  1.5*  PROT  --  4.1*  --  4.2*  AST  --  30  --  21  ALT  --  19  --  16  ALKPHOS  --  107  --  104  BILITOT  --  <0.2*  --  <0.2*   Estimated Creatinine Clearance: 48.9 ml/min (by C-G formula based on Cr of 0.6).   Assessment: 53 yo F admitted 05/07/2014 with frequent syncope, vomiting and loose stools found to have PE. Pt with consistently low calcium even after correction for low albumin. Pharmacy consulted to replace calcium. Ca 6 which corrects to 8.8 for low alb = 1.5. S/p 2gm Cagluc given 8/11 and 8/13. Noted vit D level and PTH wnl. Mg 0.6 - MD replaced with 2gm IV bolus.  Goal of Therapy:  Corrected Ca > 8.4  Plan: 1) Will f/u repeat Ca level in a few days after Mg repleted 2) Recheck Mg level in a.m. - likely will need more supplementation  Thank you for allowing pharmacy to be a part of this patients care team.  Christoper Fabianaron Niaya Hickok, PharmD, BCPS Clinical pharmacist, pager [phone] 05/14/2014 12:14  PM

## 2014-05-14 NOTE — H&P (Addendum)
Physical Medicine and Rehabilitation Admission H&P  Chief Complaint   Patient presents with   .  Deconditioning due to SIRS   .  Chronic   HPI: [name] is a 53 y.o. 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 She was admitted on 05/07/14 for work up and found to be hypotensive, tachycardic with elevated WBC and lactic acidosis. She was dehydrated and hypoxic and started on IV antibiotics for SIRS. BC X 2 and stools negative. CT head negative for acute changes. CT chest with acute bilateral PE and esophagus distended and fluid filled and lovenox added for treatment and patient wants eliquis for treatment. She was placed on liquid diet and Dr. Hilarie Fredrickson consulted for input. He recommended MRI pancreas as well as MRCP due to h/o chronic pancreatitis. Patient refused MRI study X 2 despite sedation and GI has signed off.  She has had improvement of GI symptoms and tolerated slow advancement of diet. SIRS resolved--unclear source. Syncope likely due to orthostatic hypotension and dehydration. Electrolyte abnormality treated and insulin discontinued due to hypoglycemic episodes--Hgb A1c-4.9. PT evaluation done yesterday and patient significantly deconditioned with tremors as well as ataxia with attempts at sitting at EOB. Patient has anorexia and significant amount of weight loss in the past few months due to d/c of nausea medication as well as multiple medication changes--d/c anxiolytic and addition of BP meds. Denies alcohol use since 2004, several urine alcohol toxicology performed over last several years have been negative. Urine toxicology has been positive for opiates in 2012 and in 2014 . Stays in the room most of the day and sponge bathes few days a week. Has an aide 4 hrs/5 days and 45 mins on the weekend to  help with housework, Meal prep and ADLs. She could transfer independently and ambulate short distances to BR on good days.    patient complaining of severe low back pain. She states her orthopedist Dr Lynann Bologna has been prescribed and pain medication. We called the office to confirm. Last prescription 120 tablets of hydrocodone 10/325 prescribed on 7/29. Patient admitted to the hospital 9 days later. Should have almost 3 weeks  Left Was also given a prescription for dialaudid at 8 mg tid prn #30 as a one time RX Last MRI lumbar was 2009   Review of Systems  Constitutional: Positive for weight loss and malaise/fatigue.  HENT: Negative for hearing loss.  Eyes: Negative for blurred vision and double vision.  Respiratory: Negative for cough and shortness of breath.  Cardiovascular: Negative for chest pain and leg swelling.  Gastrointestinal: Positive for abdominal pain. Negative for heartburn, nausea and vomiting.  Musculoskeletal: Positive for back pain, falls (recent falls due to weakenss), joint pain, myalgias (complaining of pain/soreness left hemibody) and neck pain.  Neurological: Positive for weakness. Negative for dizziness, speech change and headaches.  Psychiatric/Behavioral: The patient is nervous/anxious and has insomnia (chronic).   Past Medical History   Diagnosis  Date   .  Glaucoma    .  Diabetes mellitus without complication    .  Hypertension    .  Neuropathy    .  Crohn disease    .  Asthma     Past Surgical History   Procedure  Laterality  Date   .  Cesarean section      No family  history on file.  Social History: Lives with family. Disabled since 2004 due to back issues. Used to work as a [employer]arry manager. Independent prior to current illness. She reports that she has been smoking. She does not have any smokeless tobacco history on file. She reports that she drinks alcohol. She reports that she does not use illicit drugs  Allergies   Allergen  Reactions   .  Iohexol  Other (See  Comments)     "severe burning"  Patient has received Contrast in 2005 with 13 hour pre-medication, and had no reaction at that time   .  Penicillins  Hives    Medications Prior to Admission   Medication  Sig  Dispense  Refill   .  alprazolam (XANAX) 2 MG tablet  Take 2 mg by mouth 3 (three) times daily as needed for anxiety.     Marland Kitchen  amitriptyline (ELAVIL) 50 MG tablet  Take 1 tablet (50 mg total) by mouth daily.  30 tablet  0   .  cetirizine (ZYRTEC) 10 MG tablet  Take 10 mg by mouth every morning.     .  folic acid (FOLVITE) 1 MG tablet  Take 1 tablet (1 mg total) by mouth daily.  30 tablet  0   .  gabapentin (NEURONTIN) 300 MG capsule  Take 1,200-1,500 mg by mouth 3 (three) times daily.     Marland Kitchen  glimepiride (AMARYL) 2 MG tablet  Take 2 mg by mouth daily as needed (diet).     Marland Kitchen  HYDROcodone-acetaminophen (NORCO) 10-325 MG per tablet  Take 1 tablet by mouth every 6 (six) hours as needed for moderate pain.     Marland Kitchen  HYDROmorphone (DILAUDID) 8 MG tablet  Take 8 mg by mouth 2 (two) times daily as needed for severe pain.     Marland Kitchen  lisinopril (PRINIVIL,ZESTRIL) 5 MG tablet  Take 5 mg by mouth at bedtime.     .  meclizine (ANTIVERT) 25 MG tablet  Take 25 mg by mouth 3 (three) times daily as needed for dizziness.     .  methocarbamol (ROBAXIN) 750 MG tablet  Take 1 tablet (750 mg total) by mouth 2 (two) times daily as needed. For muscle spasms  30 tablet  0   .  ondansetron (ZOFRAN) 4 MG tablet  Take 4 mg by mouth every 8 (eight) hours as needed for nausea.     .  pantoprazole (PROTONIX) 20 MG tablet  Take 40 mg by mouth 2 (two) times daily.     .  polyethylene glycol (MIRALAX / GLYCOLAX) packet  Take 17 g by mouth daily.     .  promethazine (PHENERGAN) 25 MG tablet  Take 25 mg by mouth 2 (two) times daily as needed. For nausea     .  simvastatin (ZOCOR) 40 MG tablet  Take 40 mg by mouth at bedtime.     .  sulfaSALAzine (AZULFIDINE) 500 MG tablet  Take 1,000 mg by mouth daily.     Marland Kitchen  thiamine 100 MG tablet   Take 1 tablet (100 mg total) by mouth daily.  30 tablet  0   .  tiotropium (SPIRIVA) 18 MCG inhalation capsule  Place 18 mcg into inhaler and inhale daily.     .  traZODone (DESYREL) 50 MG tablet  Take 50 mg by mouth daily.     Marland Kitchen  venlafaxine (EFFEXOR) 25 MG tablet  Take 25 mg by mouth 2 (two) times daily.  Home:  Home Living  Family/patient expects to be discharged to:: Private residence  Living Arrangements: Children (53 y/o female.)  Available Help at Discharge: Family;Available PRN/intermittently;Personal care attendant  Type of Home: Apartment  Home Access: Stairs to enter  Entrance Stairs-Number of Steps: 2 flights of steps with landing.  Entrance Stairs-Rails: Right;Left  Home Layout: One level  Home Equipment: Walker - 2 wheels;Cane - single point;Bedside commode  Additional Comments: Has 48 y/o son who is not home much. Pt reports have "an assistant" who comes in for 4 hrs/day.  Functional History:  Prior Function  Level of Independence: Needs assistance  Gait / Transfers Assistance Needed: Pt reports being nonambulatory for ~1 month. Finished HHPT in June and since then has had a recent decline in mobility. Increased falls when ambulating with RW.  ADL's / Homemaking Assistance Needed: Pt reports using diapers recently. Assistance helps with ADLs as requested. Pt does not like to ask for help. Pt reports she has not been eating recently.  Functional Status:  Mobility:  Bed Mobility  Overal bed mobility: Needs Assistance  Bed Mobility: Rolling;Sidelying to Sit;Sit to Sidelying  Rolling: Modified independent (Device/Increase time)  Sidelying to sit: Mod assist;HOB elevated  Sit to sidelying: Min assist;HOB elevated  General bed mobility comments: Rolling to R/L x3 with rail for support; Sidelying to sit x2 with UEs manually mobilizing BLEs to EOB, assist with trunk and scooting bottom to EOB. Sit to sidelying x2 with pt manually lifting BLEs into bed. VC for technique. Assist  with BLEs on second transfer to bed.  Transfers  Overall transfer level: Needs assistance  Equipment used: Rolling walker (2 wheeled)  Transfers: Sit to/from [employer]  Sit to Stand: Mod assist  Squat pivot transfers: Min assist  General transfer comment: Stood from EOB x1 with therapist stabilizing B feet from sliding forward, VC for hand placement and assist with anterior translation/hip extension. Increased tremoring BUEs/LEs and trunk upon standing Mod A for balance. SPT bed <->BSC with VC for hand placement/technique,  Ambulation/Gait  General Gait Details: TBA.   ADL:   Cognition:  Cognition  Overall Cognitive Status: Within Functional Limits for tasks assessed  Orientation Level: Oriented X4  Cognition  Arousal/Alertness: Awake/alert  Behavior During Therapy: WFL for tasks assessed/performed  Overall Cognitive Status: Within Functional Limits for tasks assessed  Physical Exam:  Blood pressure 92/68, pulse 120, temperature 98 F (36.7 C), temperature source Oral, resp. rate 20, height 5' 6"  (1.676 m), weight 39 kg (85 lb 15.7 oz), SpO2 96.00%.  Physical Exam  Nursing note and vitals reviewed.  Constitutional: She is oriented to person, place, and time. She appears well-developed.  HENT:  Head: Normocephalic and atraumatic.  Eyes: Conjunctivae are normal. Pupils are equal, round, and reactive to light.  Neck: Normal range of motion.  Cardiovascular: Normal rate and regular rhythm.  Respiratory: Effort normal and breath sounds normal. No respiratory distress. She has no wheezes.  GI: Soft. Bowel sounds are normal. She exhibits no distension. There is tenderness (Nonspecific tenderness. ).  Musculoskeletal: She exhibits no edema.  Significant muscle wasting BLE/BUE distally with bilateral foot drop and tight heel cords.  Neurological: She is alert and oriented to person, place, and time.  Speech clear. Able to follow commands without difficulty. Perseverative  and needs redirection occasionally.  Motor strength is 4 minus/5 bilateral deltoid, bicep, tricep, grip 3 minus bilateral hip flexor knee extensor ankle dorsiflexor plantar flexor. Sensation reduced to the elbow right hand to the  wrist left hand to pinprick. Sensation reduced to pinprick to the knees bilaterally  Skin: Skin is warm and dry.  Psychiatric: She has a normal mood and affect. Her speech is normal and behavior is normal. Thought content normal.   Results for orders placed during the hospital encounter of 05/07/14 (from the past 48 hour(s))   GLUCOSE, CAPILLARY Status: Abnormal    Collection Time    05/12/14 6:42 PM   Result  Value  Ref Range    Glucose-Capillary  137 (*)  70 - 99 mg/dL   GLUCOSE, CAPILLARY Status: Abnormal    Collection Time    05/12/14 9:09 PM   Result  Value  Ref Range    Glucose-Capillary  121 (*)  70 - 99 mg/dL    Comment 1  Documented in Chart     Comment 2  Notify RN    GLUCOSE, CAPILLARY Status: Abnormal    Collection Time    05/13/14 7:46 AM   Result  Value  Ref Range    Glucose-Capillary  117 (*)  70 - 99 mg/dL   TSH Status: Abnormal    Collection Time    05/13/14 9:00 AM   Result  Value  Ref Range    TSH  7.110 (*)  0.350 - 4.500 uIU/mL   BASIC METABOLIC PANEL Status: Abnormal    Collection Time    05/13/14 9:00 AM   Result  Value  Ref Range    Sodium  146  137 - 147 mEq/L    Potassium  3.6 (*)  3.7 - 5.3 mEq/L    Chloride  117 (*)  96 - 112 mEq/L    CO2  15 (*)  19 - 32 mEq/L    Glucose, Bld  96  70 - 99 mg/dL    BUN  3 (*)  6 - 23 mg/dL    Creatinine, Ser  0.59  0.50 - 1.10 mg/dL    Calcium  6.0 (*)  8.4 - 10.5 mg/dL    Comment:  CRITICAL RESULT CALLED TO, READ BACK BY AND VERIFIED WITH:     GRETTA SCALCO,RN AT 1043 05/13/14 BY K BARR    GFR calc non Af Amer  >90  >90 mL/min    GFR calc Af Amer  >90  >90 mL/min    Comment:  (NOTE)     The eGFR has been calculated using the CKD EPI equation.     This calculation has not been  validated in all clinical situations.     eGFR's persistently <90 mL/min signify possible Chronic Kidney     Disease.    Anion gap  14  5 - 15   CBC Status: Abnormal    Collection Time    05/13/14 9:00 AM   Result  Value  Ref Range    WBC  15.5 (*)  4.0 - 10.5 K/uL    RBC  3.11 (*)  3.87 - 5.11 MIL/uL    Hemoglobin  9.4 (*)  12.0 - 15.0 g/dL    HCT  29.0 (*)  36.0 - 46.0 %    MCV  93.2  78.0 - 100.0 fL    MCH  30.2  26.0 - 34.0 pg    MCHC  32.4  30.0 - 36.0 g/dL    RDW  17.2 (*)  11.5 - 15.5 %    Platelets  322  150 - 400 K/uL   GLUCOSE, CAPILLARY Status: Abnormal    Collection Time  05/13/14 11:31 AM   Result  Value  Ref Range    Glucose-Capillary  180 (*)  70 - 99 mg/dL   GLUCOSE, CAPILLARY Status: Abnormal    Collection Time    05/13/14 5:00 PM   Result  Value  Ref Range    Glucose-Capillary  131 (*)  70 - 99 mg/dL   GLUCOSE, CAPILLARY Status: Abnormal    Collection Time    05/13/14 9:36 PM   Result  Value  Ref Range    Glucose-Capillary  151 (*)  70 - 99 mg/dL    Comment 1  Notify RN     Comment 2  Documented in Chart    MAGNESIUM Status: Abnormal    Collection Time    05/14/14 5:21 AM   Result  Value  Ref Range    Magnesium  0.6 (*)  1.5 - 2.5 mg/dL    Comment:  CRITICAL RESULT CALLED TO, READ BACK BY AND VERIFIED WITH:     WOOLEYHAN,J RN @ [phone] 05/14/14 LEONARD,A   CBC Status: Abnormal    Collection Time    05/14/14 5:21 AM   Result  Value  Ref Range    WBC  15.4 (*)  4.0 - 10.5 K/uL    RBC  2.85 (*)  3.87 - 5.11 MIL/uL    Hemoglobin  8.8 (*)  12.0 - 15.0 g/dL    HCT  27.4 (*)  36.0 - 46.0 %    MCV  96.1  78.0 - 100.0 fL    MCH  30.9  26.0 - 34.0 pg    MCHC  32.1  30.0 - 36.0 g/dL    RDW  17.6 (*)  11.5 - 15.5 %    Platelets  298  150 - 400 K/uL   COMPREHENSIVE METABOLIC PANEL Status: Abnormal    Collection Time    05/14/14 5:21 AM   Result  Value  Ref Range    Sodium  147  137 - 147 mEq/L    Potassium  3.6 (*)  3.7 - 5.3 mEq/L    Chloride  119 (*)   96 - 112 mEq/L    CO2  18 (*)  19 - 32 mEq/L    Glucose, Bld  75  70 - 99 mg/dL    BUN  4 (*)  6 - 23 mg/dL    Creatinine, Ser  0.60  0.50 - 1.10 mg/dL    Calcium  6.8 (*)  8.4 - 10.5 mg/dL    Total Protein  4.2 (*)  6.0 - 8.3 g/dL    Albumin  1.5 (*)  3.5 - 5.2 g/dL    AST  21  0 - 37 U/L    ALT  16  0 - 35 U/L    Alkaline Phosphatase  104  39 - 117 U/L    Total Bilirubin  <0.2 (*)  0.3 - 1.2 mg/dL    GFR calc non Af Amer  >90  >90 mL/min    GFR calc Af Amer  >90  >90 mL/min    Comment:  (NOTE)     The eGFR has been calculated using the CKD EPI equation.     This calculation has not been validated in all clinical situations.     eGFR's persistently <90 mL/min signify possible Chronic Kidney     Disease.    Anion gap  10  5 - 15   GLUCOSE, CAPILLARY Status: Abnormal    Collection Time  05/14/14 7:55 AM   Result  Value  Ref Range    Glucose-Capillary  69 (*)  70 - 99 mg/dL   GLUCOSE, CAPILLARY Status: Abnormal    Collection Time    05/14/14 8:30 AM   Result  Value  Ref Range    Glucose-Capillary  103 (*)  70 - 99 mg/dL   GLUCOSE, CAPILLARY Status: Abnormal    Collection Time    05/14/14 11:44 AM   Result  Value  Ref Range    Glucose-Capillary  132 (*)  70 - 99 mg/dL    No results found.  Medical Problem List and Plan:  1. Functional deficits secondary to Deconditioning due to SIRS and chronic malnutrition.  2. Bilateral PE /Anticoagulation: Pharmaceutical: Other (comment)-Eliquis (Has trial card/On medicaid preferred List)  3. Chronic back pain/Neuropathy/Pain Management: Reduce dilaudid to 53m every 8 hours prn with hydrocodone prn 10/325 Q6h (her usual Med). Given history of alcohol is some concerns for medication overuse. Given complaints of severe back pain, recent infection and last MRI greater than 5 years ago repeat lumbar MRI 4. Mood: Team to provide ego support. LCSW to follow for evaluation and support.  5. Neuropsych: This patient is capable of making decisions  on Her own behalf.  6. Skin/Wound Care: Maintain adequate hydration and nutritional status to prevent breakdown. Continue ensure pudding.  7. Malnutrition: with low magnesium level, low phosphorous, low protein, low albumin. Corrected calcium--8.4. treated with IV mag today--recheck levels in am. Will add oral supplement magnesium and recheck on Monday.  8. Leucocytosis: Likely reactive. Did get short course steroids. Will continue to follow for signs of infection.  9. Chronic pancreatitis with abdominal pain: Appetite improving and feels current medication regimen effective in controlling symptoms.  10 DM type 2: Well controlled--Hgb A1c-4.6. Off medications due to hypoglycemia and poor intake. Will monitor with ac/hs checks and resume medication if indicated.  11. H/o depression/anxiety: continue Effexor bid as well as trazodone at bedtime for chronic insomnia. Change xanax to klonopin. Recheck TSH Monday.  Post Admission Physician Evaluation:  1. Functional deficits secondary to deconditioning following SIRS/Bilateral PE. 2. Patient is admitted to receive collaborative, interdisciplinary care between the physiatrist, rehab nursing staff, and therapy team. 3. Patient's level of medical complexity and substantial therapy needs in context of that medical necessity cannot be provided at a lesser intensity of care such as a SNF. 4. Patient has experienced substantial functional loss from his/her baseline which was documented above under the "Functional History" and "Functional Status" headings. Judging by the patient's diagnosis, physical exam, and functional history, the patient has potential for functional progress which will result in measurable gains while on inpatient rehab. These gains will be of substantial and practical use upon discharge in facilitating mobility and self-care at the household level. 5. Physiatrist will provide 24 hour management of medical needs as well as oversight of the therapy  plan/treatment and provide guidance as appropriate regarding the interaction of the two. 6. 24 hour rehab nursing will assist with bladder management, bowel management, safety, skin/wound care, disease management, medication administration, pain management and patient education and help integrate therapy concepts, techniques,education, etc. 7. PT will assess and treat for/with: pre gait, gait training, endurance , safety, equipment, neuromuscular re education. Goals are: Mod I. 8. OT will assess and treat for/with: ADLs, Cognitive perceptual skills, Neuromuscular re education, safety, endurance, equipment. Goals are: Mod I. 9. SLP will assess and treat for/with: NA. Goals are: NA. 10. Case Management and Social  Worker will assess and treat for psychological issues and discharge planning. 11. Team conference will be held weekly to assess progress toward goals and to determine barriers to discharge. 12. Patient will receive at least 3 hours of therapy per day at least 5 days per week. 13. ELOS: 7-10days  14. Prognosis: good Charlett Blake M.D. [facility] FAAPM&R (Sports Med, Neuromuscular Med) Diplomate [place] Board of Electrodiagnostic Med   05/14/2014

## 2014-05-14 NOTE — Discharge Summary (Signed)
Physician Discharge Summary  [name] ZOX:[id number] DOB: 08/11/1961 DOA: 05/07/2014  PCP: [name] Mccreedy, MD  Admit date: 05/07/2014 Discharge date: 05/14/2014  Time spent: 45 minutes  Recommendations for Outpatient Follow-up:  1. PCP in 1 week 2. Transition from Apixaban 12m bid to 517mBID on 8/19 3. Dr.Patrick Hung in 2-3weeks, need MRCP to eval pancreatic ductal dilation  Discharge Diagnoses:  Principal Problem:  Nausea/Vomiting/Diarrhea  Gastroenteritis  Orthostatic hypotension  AKI   DIABETES MELLITUS, TYPE II   ASTHMA   Crohn's disease   Nicotine abuse   Severe malnutrition   Hypocalcemia   Hypomagnesemia  Discharge Condition: stable  Diet recommendation: regular  Filed Weights   05/09/14 0527 05/11/14 0451 05/12/14 0430  Weight: 39.962 kg (88 lb 1.6 oz) 39.8 kg (87 lb 11.9 oz) 39 kg (85 lb 15.7 oz)    History of present illness:  is a [age].o. female with DM, HTN, Crohn's disease who smoke presented following an episode of syncope today and reports that she has been passing out frequently for 3 wks. She has been lightheaded while walking. She does not have seizure like activity when she passed out.  She is found to be hypotensive and tachycardic with Lactic acidosis and leukocytosis. She has been vomiting and having loose stools for months but her symptoms have been worse over the past couple of weeks. No blood noted in vomitus or in stool. She has lost a significant amount of weight but unable to quantify   Hospital Course:   Nausea / Vomiting and diarrhea  -improved  -likely viral gastroenteritis,  -suspect a component of some chronic diarrhea due to chronic pancreatitis  -c dif neg, GI pathogen panel negative  -per GI, no evidence of active crohns  -improved, continue PRN Imodium, tolerating diet  -GI signed off, recommended FU with Dr.Hung   Interval increase dilatation of pancreatic duct/ Chronic dilatation/Elevated alk Phos  -has chronic CBD  dilatation and new dilatation of pancreatic duct on CT- also noted to have an atrophic pancreas -  -MRI/MRCP attempted and she declined this on 8/11, 8/13,will defer to Dr.Hung   SIRS (systemic inflammatory response syndrome)  - source likely 1, improved  - CXR clear , stopped levaquin since no clear indication for this  - c dif and GI pathogen panel neg and per GI as above no evidence of active crohns  -leukocytosis, likely worsened by steroids ( received prior to CTA to r/o PE) improving  - blood cultures to date with no growth, UA negative   Acute PE  -CT angio with bil PEs noted this admission, s/p 3 days of therapeutic lovenox  -discussed oral anticoagulation options and she specifically wants Eliquis  -transitioned to Eliquis 8/12   Syncope  - suspect orthostatic hypotension- CT head negative  -BP still soft, but stable per pt baseline BP low   ARF  -resolved with hydration   History of DIABETES MELLITUS, TYPE II with neuropathy  -Stop Insulin- A1c nl at 4.9, no need to check CBGs  ASTHMA  - she is no longer taking Spiriva (she states causes vomiting??)  - continue PRN Albuterol nebs   Crohn's disease  -cont Sulfasalazine  -GI consulted>> no evidence of active crohns   Severe Protein Calorie Malnutrition  -nutritional supplements  Hypocalcemia  -phos 1.7, parathyroid hormone, vitamin D. 25-hydroxy normal  - corrected calcium is higher, replace Phos  -Replaced IV and PO, improving now   Hypomagnesemia -given Mag sulphate 2gm IV x2  Metabolic acidosis,  -  due to 1,3  -improving now   Consultations:  GI  Discharge Exam: Filed Vitals:   05/14/14 0539  BP: 92/68  Pulse: 120  Temp: 98 F (36.7 C)  Resp: 20    General: AAOx3 Cardiovascular: S1S2/RRR Respiratory: CTAB  Discharge Instructions You were cared for by a hospitalist during your hospital stay. If you have any questions about your discharge medications or the care you received while you were  in the hospital after you are discharged, you can call the unit and asked to speak with the hospitalist on call if the hospitalist that took care of you is not available. Once you are discharged, your primary care physician will handle any further medical issues. Please note that NO REFILLS for any discharge medications will be authorized once you are discharged, as it is imperative that you return to your primary care physician (or establish a relationship with a primary care physician if you do not have one) for your aftercare needs so that they can reassess your need for medications and monitor your lab values.      Discharge Instructions   Diet - low sodium heart healthy    Complete by:  As directed      Increase activity slowly    Complete by:  As directed             Medication List    STOP taking these medications       glimepiride 2 MG tablet  Commonly known as:  AMARYL     lisinopril 5 MG tablet  Commonly known as:  PRINIVIL,ZESTRIL     meclizine 25 MG tablet  Commonly known as:  ANTIVERT     methocarbamol 750 MG tablet  Commonly known as:  ROBAXIN     polyethylene glycol packet  Commonly known as:  MIRALAX / GLYCOLAX     promethazine 25 MG tablet  Commonly known as:  PHENERGAN      TAKE these medications       alprazolam 2 MG tablet  Commonly known as:  XANAX  Take 2 mg by mouth 3 (three) times daily as needed for anxiety.     amitriptyline 50 MG tablet  Commonly known as:  ELAVIL  Take 1 tablet (50 mg total) by mouth at bedtime as needed for sleep.     apixaban 5 MG Tabs tablet  Commonly known as:  ELIQUIS  Take 2 tablets (10 mg total) by mouth 2 (two) times daily. From 8/14 till 8/18     apixaban 5 MG Tabs tablet  Commonly known as:  ELIQUIS  Take 1 tablet (5 mg total) by mouth 2 (two) times daily.  Start taking on:  05/19/2014     cetirizine 10 MG tablet  Commonly known as:  ZYRTEC  Take 10 mg by mouth every morning.     folic acid 1 MG tablet   Commonly known as:  FOLVITE  Take 1 tablet (1 mg total) by mouth daily.     gabapentin 400 MG capsule  Commonly known as:  NEURONTIN  Take 1 capsule (400 mg total) by mouth 3 (three) times daily.     HYDROcodone-acetaminophen 10-325 MG per tablet  Commonly known as:  NORCO  Take 1 tablet by mouth every 6 (six) hours as needed for moderate pain.     HYDROmorphone 8 MG tablet  Commonly known as:  DILAUDID  Take 8 mg by mouth 2 (two) times daily as needed for severe pain.  loperamide 2 MG capsule  Commonly known as:  IMODIUM  Take 1 capsule (2 mg total) by mouth 3 (three) times daily as needed for diarrhea or loose stools.     ondansetron 4 MG tablet  Commonly known as:  ZOFRAN  Take 4 mg by mouth every 8 (eight) hours as needed for nausea.     pantoprazole 20 MG tablet  Commonly known as:  PROTONIX  Take 2 tablets (40 mg total) by mouth daily.     simvastatin 40 MG tablet  Commonly known as:  ZOCOR  Take 40 mg by mouth at bedtime.     sulfaSALAzine 500 MG tablet  Commonly known as:  AZULFIDINE  Take 1,000 mg by mouth daily.     thiamine 100 MG tablet  Take 1 tablet (100 mg total) by mouth daily.     tiotropium 18 MCG inhalation capsule  Commonly known as:  SPIRIVA  Place 18 mcg into inhaler and inhale daily.     traZODone 50 MG tablet  Commonly known as:  DESYREL  Take 50 mg by mouth daily.     venlafaxine 25 MG tablet  Commonly known as:  EFFEXOR  Take 25 mg by mouth 2 (two) times daily.       Allergies  Allergen Reactions  . Iohexol Other (See Comments)    "severe burning" Patient has received Contrast in 2005 with 13 hour pre-medication, and had no reaction at that time  . Penicillins Hives      The results of significant diagnostics from this hospitalization (including imaging, microbiology, ancillary and laboratory) are listed below for reference.    Significant Diagnostic Studies: Ct Abdomen Pelvis Wo Contrast  05/07/2014   CLINICAL DATA:   Diffuse abdominal pain with nausea vomiting and diarrhea  EXAM: CT ABDOMEN AND PELVIS WITHOUT CONTRAST  TECHNIQUE: Multidetector CT imaging of the abdomen and pelvis was performed following the standard protocol without IV contrast.  COMPARISON:  07/30/2011 CT and 10/05/2013 ultrasound  FINDINGS: Visualized portions of the lung bases are clear.  Stable 2.5 cm low-attenuation lesion anterior right dome of the liver. 5 mm low-attenuation round lesion posterior right lobe of liver image number 22, not visible on the prior study. Gallbladder is mildly prominent, but stable in appearance. Common bile duct is significantly distended to about 1.5 cm, which is a stable finding.  Spleen is normal. Pancreatic duct is prominent. It measures up to about 6 mm, which is more prominent when compared to the prior study. The pancreas is otherwise normal except for moderate diffuse atrophy.  Adrenal glands are normal. There are a few punctate calcifications in both kidneys suggesting tiny nonobstructing calculi.  There is mild to moderate calcification of the abdominal aorta. There is no aortic dilatation identified. Bladder is relatively decompressed. Reproductive organs not clearly identified. No ascites.  There is a nonobstructive bowel gas pattern. Small and large bowel appear normal. Similar to prior study, there is some degree of gastric distention. Appendix is normal.  There are no acute musculoskeletal findings.  IMPRESSION: 1. Stable low-attenuation lesion in the dome of the liver, likely benign 2. Stable dilatation of the common bile duct. 3. Interval increase in the dilatation of the pancreatic duct. Patient with previous history of pancreatitis. Consider MRCP or ERCP to further evaluate, as obstructing lesion causing pancreatic ductal dilatation is not excluded.   Electronically Signed   By: Skipper Cliche M.D.   On: 05/07/2014 23:51   Dg Chest 2 View  05/09/2014  CLINICAL DATA:  53 year old female with pulmonary  emboli. Respiratory difficulty.  EXAM: CHEST  2 VIEW  COMPARISON:  05/07/2014 chest radiograph.  FINDINGS: This is a low volume film with mild bibasilar atelectasis.  Cardiomediastinal silhouette is unremarkable.  There is no evidence of focal airspace disease, pulmonary edema, suspicious pulmonary nodule/mass, pleural effusion, or pneumothorax. No acute bony abnormalities are identified.  IMPRESSION: Low volume film with mild bibasilar atelectasis.   Electronically Signed   By: Hassan Rowan M.D.   On: 05/09/2014 08:29   Dg Chest 2 View  05/07/2014   CLINICAL DATA:  Multiple falls.  Concern for chest injury.  EXAM: CHEST  2 VIEW  COMPARISON:  Chest radiograph from 11/20/2012  FINDINGS: The lungs are well-aerated and clear. There is no evidence of focal opacification, pleural effusion or pneumothorax.  The heart is normal in size; the mediastinal contour is within normal limits. No acute osseous abnormalities are seen.  IMPRESSION: No acute cardiopulmonary process seen. No displaced rib fractures identified.   Electronically Signed   By: Garald Balding M.D.   On: 05/07/2014 22:17   Dg Cervical Spine Complete  05/07/2014   CLINICAL DATA:  Multiple falls.  Neck pain.  EXAM: CERVICAL SPINE  4+ VIEWS  COMPARISON:  MRI of the cervical spine performed 04/29/2008  FINDINGS: There is no evidence of fracture or subluxation. Vertebral bodies demonstrate normal height and alignment. Intervertebral disc spaces are preserved. Prevertebral soft tissues are within normal limits. The provided odontoid view demonstrates no significant abnormality.  The visualized lung apices are clear.  IMPRESSION: No evidence of fracture or subluxation along the cervical spine.   Electronically Signed   By: Garald Balding M.D.   On: 05/07/2014 22:19   Ct Head Wo Contrast  05/07/2014   CLINICAL DATA:  Pain at the top of the head, radiating down the neck. Nausea, vomiting and diarrhea.  EXAM: CT HEAD WITHOUT CONTRAST  TECHNIQUE: Contiguous axial  images were obtained from the base of the skull through the vertex without intravenous contrast.  COMPARISON:  CT of the head performed 10/21/2012  FINDINGS: There is no evidence of acute infarction, mass lesion, or intra- or extra-axial hemorrhage on CT.  Prominence of the sulci suggests mild cortical volume loss. Mild cerebellar atrophy is noted. Mild periventricular white matter change likely reflects small vessel ischemic microangiopathy.  The brainstem and fourth ventricle are within normal limits. The basal ganglia are unremarkable in appearance. The cerebral hemispheres demonstrate grossly normal gray-white differentiation. No mass effect or midline shift is seen.  There is no evidence of fracture; visualized osseous structures are unremarkable in appearance. The orbits are within normal limits. The paranasal sinuses and mastoid air cells are well-aerated. No significant soft tissue abnormalities are seen.  IMPRESSION: 1. No evidence of traumatic intracranial injury or fracture. 2. Mild cortical volume loss and scattered small vessel ischemic microangiopathy.   Electronically Signed   By: Garald Balding M.D.   On: 05/07/2014 23:44   Ct Angio Chest Pe W/cm &/or Wo Cm  05/08/2014   CLINICAL DATA:  Shortness of breath  EXAM: CT ANGIOGRAPHY CHEST WITH CONTRAST  TECHNIQUE: Multidetector CT imaging of the chest was performed using the standard protocol during bolus administration of intravenous contrast. Multiplanar CT image reconstructions and MIPs were obtained to evaluate the vascular anatomy.  CONTRAST:  58m OMNIPAQUE IOHEXOL 350 MG/ML SOLN  COMPARISON:  Chest radiograph 05/07/2014: CT scan abdomen pelvis 05/07/2014  FINDINGS: There are bilateral filling defects in the pulmonary  arterial system. There is a small volume of thrombus with in the left lower lobe pulmonary arterial system involving a few tertiary branches. Thrombosis is not occlusive. On the right, there are also several filling defects and lower  lobe pulmonary arterial branches which are not occlusive.  There is mild bilateral lower lobe atelectasis. There is no significant pleural or pericardial effusion. There is no significant hilar or mediastinal adenopathy. There is calcification of the left anterior descending coronary artery.  Esophagus is distended and filled with fluid. No acute musculoskeletal findings involving the thorax. Low-attenuation lesion right dome of the liver as described on 05/07/2014 CT abdomen.  Review of the MIP images confirms the above findings.  IMPRESSION: Acute nonobstructive bilateral pulmonary emboli. Critical Value/emergent results were called by telephone at the time of interpretation on 05/08/2014 at 10:00 pm Elzie Rings, the patient's nurse, who verbally acknowledged these results and indicated she would relay this information to the patient's referring physician.   Electronically Signed   By: Skipper Cliche M.D.   On: 05/08/2014 22:00    Microbiology: Recent Results (from the past 240 hour(s))  URINE CULTURE     Status: None   Collection Time    05/07/14 11:13 PM      Result Value Ref Range Status   Specimen Description URINE, CATHETERIZED   Final   Special Requests NONE   Final   Culture  Setup Time     Final   Value: 05/08/2014 00:46     Performed at [facility]     Final   Value: NO GROWTH     Performed at [facility]   Culture     Final   Value: NO GROWTH     Performed at [facility]   Report Status 05/09/2014 FINAL   Final  CULTURE, BLOOD (ROUTINE X 2)     Status: None   Collection Time    05/08/14 12:25 AM      Result Value Ref Range Status   Specimen Description BLOOD RIGHT FOREARM   Final   Special Requests BOTTLES DRAWN AEROBIC AND ANAEROBIC [facility] EACH   Final   Culture  Setup Time     Final   Value: 05/08/2014 11:23     Performed at [facility]   Culture     Final   Value: NO GROWTH 5 DAYS     Performed at [facility]    Report Status 05/14/2014 FINAL   Final  CULTURE, BLOOD (ROUTINE X 2)     Status: None   Collection Time    05/08/14 12:35 AM      Result Value Ref Range Status   Specimen Description BLOOD LEFT FOREARM   Final   Special Requests BOTTLES DRAWN AEROBIC ONLY 3CC   Final   Culture  Setup Time     Final   Value: 05/08/2014 11:24     Performed at [facility]   Culture     Final   Value: NO GROWTH 5 DAYS     Performed at [facility]   Report Status 05/14/2014 FINAL   Final  MRSA PCR SCREENING     Status: None   Collection Time    05/08/14  1:06 AM      Result Value Ref Range Status   MRSA by PCR NEGATIVE  NEGATIVE Final   Comment:            The GeneXpert MRSA  Assay (FDA     approved for NASAL specimens     only), is one component of a     comprehensive MRSA colonization     surveillance program. It is not     intended to diagnose MRSA     infection nor to guide or     monitor treatment for     MRSA infections.  CLOSTRIDIUM DIFFICILE BY PCR     Status: None   Collection Time    05/08/14  4:19 PM      Result Value Ref Range Status   C difficile by pcr NEGATIVE  NEGATIVE Final  STOOL CULTURE     Status: None   Collection Time    05/08/14  4:19 PM      Result Value Ref Range Status   Specimen Description STOOL   Final   Special Requests NONE   Final   Culture     Final   Value: NO SALMONELLA, SHIGELLA, CAMPYLOBACTER, YERSINIA, OR E.COLI 0157:H7 ISOLATED     Note: REDUCED NORMAL FLORA PRESENT     Performed at [facility]   Report Status 05/12/2014 FINAL   Final     Labs: Basic Metabolic Panel:  Recent Labs Lab 05/10/14 0930 05/11/14 0331 05/11/14 2208 05/12/14 0354 05/13/14 0900 05/14/14 0521  NA 144 142  --  146 146 147  K 3.7 3.2*  --  3.1* 3.6* 3.6*  CL 117* 115*  --  118* 117* 119*  CO2 15* 13*  --  15* 15* 18*  GLUCOSE 72 68*  --  95 96 75  BUN 5* 5*  --  5* 3* 4*  CREATININE 0.68 0.62  --  0.60 0.59 0.60  CALCIUM 5.2* 5.4*  --   6.0* 6.0* 6.8*  MG  --   --   --   --   --  0.6*  PHOS  --   --  1.7*  --   --   --    Liver Function Tests:  Recent Labs Lab 05/07/14 2102 05/08/14 0406 05/12/14 0354 05/14/14 0521  AST 32 _0 ALT _1 ALKPHOS 219* 183* 107 104  BILITOT 0.4 0.3 <0.2* <0.2*  PROT 6.0 4.9* 4.1* 4.2*  ALBUMIN 2.5* 2.0* 1.5* 1.5*    Recent Labs Lab 05/07/14 2102  LIPASE 11   No results found for this basename: AMMONIA,  in the last 168 hours CBC:  Recent Labs Lab 05/07/14 2102 05/08/14 0406 05/09/14 0339 05/11/14 0331 05/13/14 0900 05/14/14 0521  WBC 17.5* 14.5* 15.6* 11.9* 15.5* 15.4*  NEUTROABS 13.9*  --   --   --   --   --   HGB 12.3 10.3* 9.0* 9.4* 9.4* 8.8*  HCT 36.9 31.2* 27.9* 30.4* 29.0* 27.4*  MCV 90.9 90.2 93.3 93.5 93.2 96.1  PLT 348 292 306 280 322 298   Cardiac Enzymes: No results found for this basename: CKTOTAL, CKMB, CKMBINDEX, TROPONINI,  in the last 168 hours BNP: BNP (last 3 results) No results found for this basename: PROBNP,  in the last 8760 hours CBG:  Recent Labs Lab 05/13/14 1700 05/13/14 2136 05/14/14 0755 05/14/14 0830 05/14/14 1144  GLUCAP 131* 151* 69* 103* 132*       Signed:  Uchenna Rappaport  [facility] 05/14/2014, 1:21 PM

## 2014-05-14 NOTE — Consult Note (Signed)
Physical Medicine and Rehabilitation Consult  Reason for Consult: Deconditioning in patient with SIRS and FTT Referring Physician: Dr. Jomarie LongsJoseph   HPI: [name] is a 53 y.o. 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 She was admitted on 05/07/14 for work up and found to be hypotensive, tachycardic with elevated WBC and lactic acidosis. She was dehydrated and hypoxic and started on IV antibiotics for SIRS. BC X 2 and stools negative. CT head negative for acute changes. CT chest with acute bilateral PE and esophagus distended and fluid filled and lovenox added for treatment and patient wants eliquis for treatment. She was placed on liquid diet and  Dr. Rhea BeltonPyrtle consulted for input. He recommended MRI pancreas as well as MRCP due to h/o chronic pancreatitis. Patient refused MRI study X 2  despite sedation and GI has signed off.     She has had improvement of GI symptoms and tolerated slow advancement of diet. SIRS resolved--unclear source. Syncope likely due to orthostatic hypotension and dehydration. Electrolyte abnormality treated and insulin discontinued due to hypoglycemic episodes--Hgb A1c-4.9. PT evaluation done yesterday and patient significantly deconditioned with tremors as well as ataxia with attempts at sitting at EOB.   Patient with anorexia and significant amount of weight loss in the past few months. She reports that she has been getting sick past 2 months with nausea due to multiple medication changes and d/c of antiemetic. Stays in the room most of the day and sponge bathes few days a week.  Has an aide 4 hrs/5 days and 45 mins on the weekend to help with housework,  Meal prep and ADLs. She could transfer independently and ambulate short distances to BR on good days.   Patient states  she has been told that she has diabetic neuropathy as well as arthritis. Denies alcohol use since 2004, several urine alcohol toxicology performed over last several years have been negative. Urine toxicology has been positive for opiates in 2012 in 2014 Review of Systems  Constitutional: Positive for weight loss and malaise/fatigue.  HENT: Negative for hearing loss.   Respiratory: Negative for shortness of breath.   Cardiovascular: Negative for chest pain and palpitations.  Gastrointestinal: Positive for abdominal pain. Negative for nausea and vomiting.  Musculoskeletal: Positive for back pain, myalgias and neck pain.  Neurological: Positive for weakness. Negative for headaches.  Psychiatric/Behavioral: The patient is nervous/anxious.     Past Medical History  Diagnosis Date  . Glaucoma   . Diabetes mellitus without complication   . Hypertension   . Neuropathy   . Crohn disease   . Asthma    Past Surgical History  Procedure Laterality Date  . Cesarean section     No family history on file.  Social History:  Lives with family. Disabled since 2004 due to back issues. Used to work as a Lawyer[employer]NA. Independent prior to current illness.  She reports that she has been smoking.  She does not have any smokeless tobacco history on file. She reports that she drinks alcohol. She reports that she does not use illicit drugs.   Allergies  Allergen Reactions  . Iohexol Other (See Comments)    "severe burning" Patient has received Contrast in 2005 with 13 hour pre-medication, and had no reaction at that time  . Penicillins Hives  Medications Prior to Admission  Medication Sig Dispense Refill  . alprazolam (XANAX) 2 MG tablet Take 2 mg by mouth 3 (three) times daily as needed for anxiety.      Marland Kitchen amitriptyline (ELAVIL) 50 MG tablet Take 1 tablet (50 mg total) by mouth daily.  30 tablet  0  . cetirizine (ZYRTEC) 10 MG tablet Take 10 mg by mouth every morning.      . folic acid (FOLVITE) 1 MG  tablet Take 1 tablet (1 mg total) by mouth daily.  30 tablet  0  . gabapentin (NEURONTIN) 300 MG capsule Take 1,200-1,500 mg by mouth 3 (three) times daily.       Marland Kitchen glimepiride (AMARYL) 2 MG tablet Take 2 mg by mouth daily as needed (diet).       Marland Kitchen HYDROcodone-acetaminophen (NORCO) 10-325 MG per tablet Take 1 tablet by mouth every 6 (six) hours as needed for moderate pain.      Marland Kitchen HYDROmorphone (DILAUDID) 8 MG tablet Take 8 mg by mouth 2 (two) times daily as needed for severe pain.      Marland Kitchen lisinopril (PRINIVIL,ZESTRIL) 5 MG tablet Take 5 mg by mouth at bedtime.       . meclizine (ANTIVERT) 25 MG tablet Take 25 mg by mouth 3 (three) times daily as needed for dizziness.       . methocarbamol (ROBAXIN) 750 MG tablet Take 1 tablet (750 mg total) by mouth 2 (two) times daily as needed. For muscle spasms  30 tablet  0  . ondansetron (ZOFRAN) 4 MG tablet Take 4 mg by mouth every 8 (eight) hours as needed for nausea.      . pantoprazole (PROTONIX) 20 MG tablet Take 40 mg by mouth 2 (two) times daily.      . polyethylene glycol (MIRALAX / GLYCOLAX) packet Take 17 g by mouth daily.      . promethazine (PHENERGAN) 25 MG tablet Take 25 mg by mouth 2 (two) times daily as needed. For nausea      . simvastatin (ZOCOR) 40 MG tablet Take 40 mg by mouth at bedtime.       . sulfaSALAzine (AZULFIDINE) 500 MG tablet Take 1,000 mg by mouth daily.       Marland Kitchen thiamine 100 MG tablet Take 1 tablet (100 mg total) by mouth daily.  30 tablet  0  . tiotropium (SPIRIVA) 18 MCG inhalation capsule Place 18 mcg into inhaler and inhale daily.      . traZODone (DESYREL) 50 MG tablet Take 50 mg by mouth daily.        Marland Kitchen venlafaxine (EFFEXOR) 25 MG tablet Take 25 mg by mouth 2 (two) times daily.        Home: Home Living Family/patient expects to be discharged to:: Private residence Living Arrangements: Children (53 y/o female.) Available Help at Discharge: Family;Available PRN/intermittently;Personal care attendant Type of Home:  Apartment Home Access: Stairs to enter Entrance Stairs-Number of Steps: 2 flights of steps with landing. Entrance Stairs-Rails: Right;Left Home Layout: One level Home Equipment: Walker - 2 wheels;Cane - single point;Bedside commode Additional Comments: Has 64 y/o son who is not home much. Pt reports have "an assistant" who comes in for 4 hrs/day.  Functional History: Prior Function Level of Independence: Needs assistance Gait / Transfers Assistance Needed: Pt reports being nonambulatory for ~1 month. Finished HHPT in June and since then has had a recent decline in mobility. Increased falls when ambulating with RW. ADL's / Homemaking Assistance Needed: Pt reports using  diapers recently. Assistance helps with ADLs as requested. Pt does not like to ask for help. Pt reports she has not been eating recently. Functional Status:  Mobility: Bed Mobility Overal bed mobility: Needs Assistance Bed Mobility: Rolling;Sidelying to Sit;Sit to Sidelying Rolling: Modified independent (Device/Increase time) Sidelying to sit: Mod assist;HOB elevated Sit to sidelying: Min assist;HOB elevated General bed mobility comments: Rolling to R/L x3 with rail for support; Sidelying to sit x2 with UEs manually mobilizing BLEs to EOB, assist with trunk and scooting bottom to EOB. Sit to sidelying x2 with pt manually lifting BLEs into bed. VC for technique. Assist with BLEs on second transfer to bed.  Transfers Overall transfer level: Needs assistance Equipment used: Rolling walker (2 wheeled) Transfers: Sit to/from [employer] Sit to Stand: Mod assist Squat pivot transfers: Min assist General transfer comment: Stood from EOB x1 with therapist stabilizing B feet from sliding forward, VC for hand placement and assist with anterior translation/hip extension. Increased tremoring BUEs/LEs and trunk upon standing Mod A for balance. SPT bed <->BSC with VC for hand placement/technique, Ambulation/Gait General  Gait Details: TBA.    ADL:    Cognition: Cognition Overall Cognitive Status: Within Functional Limits for tasks assessed Orientation Level: Oriented X4 Cognition Arousal/Alertness: Awake/alert Behavior During Therapy: WFL for tasks assessed/performed Overall Cognitive Status: Within Functional Limits for tasks assessed  Blood pressure 92/68, pulse 120, temperature 98 F (36.7 C), temperature source Oral, resp. rate 20, height 5\' 6"  (1.676 m), weight 39 kg (85 lb 15.7 oz), SpO2 96.00%. Physical Exam  Nursing note and vitals reviewed. Constitutional: She is oriented to person, place, and time. She appears well-developed.  HENT:  Head: Normocephalic and atraumatic.  Eyes: Conjunctivae are normal. Pupils are equal, round, and reactive to light.  Neck: Normal range of motion. Neck supple.  Cardiovascular: Normal rate and regular rhythm.   No murmur heard. Respiratory: Effort normal and breath sounds normal. No respiratory distress. She has no wheezes.  GI: Soft. Bowel sounds are normal. She exhibits no distension. There is no tenderness.  Musculoskeletal: She exhibits no edema.  Significant muscle wasting distally with diffuse weakness. Bilateral foot drop with tight heel cords.   Neurological: She is alert and oriented to person, place, and time.  Speech clear. Verbose and needs redirection due to perseveration. Able to follow commands without difficulty.   Skin: Skin is warm and dry.  Psychiatric: She has a normal mood and affect. Her speech is normal and behavior is normal.   motor strength is 4 minus/5 bilateral deltoid, bicep, tricep, grip 3 minus bilateral hip flexor knee extensor ankle dorsiflexor plantar flexor Sensation reduced to the elbow right hand to the wrist left hand to pinprick Sensation reduced to pinprick to the knees bilaterally  Results for orders placed during the hospital encounter of 05/07/14 (from the past 24 hour(s))  TSH     Status: Abnormal   Collection  Time    05/13/14  9:00 AM      Result Value Ref Range   TSH 7.110 (*) 0.350 - 4.500 uIU/mL  BASIC METABOLIC PANEL     Status: Abnormal   Collection Time    05/13/14  9:00 AM      Result Value Ref Range   Sodium 146  137 - 147 mEq/L   Potassium 3.6 (*) 3.7 - 5.3 mEq/L   Chloride 117 (*) 96 - 112 mEq/L   CO2 15 (*) 19 - 32 mEq/L   Glucose, Bld 96  70 -  99 mg/dL   BUN 3 (*) 6 - 23 mg/dL   Creatinine, Ser 9.44  0.50 - 1.10 mg/dL   Calcium 6.0 (*) 8.4 - 10.5 mg/dL   GFR calc non Af Amer >90  >90 mL/min   GFR calc Af Amer >90  >90 mL/min   Anion gap 14  5 - 15  CBC     Status: Abnormal   Collection Time    05/13/14  9:00 AM      Result Value Ref Range   WBC 15.5 (*) 4.0 - 10.5 K/uL   RBC 3.11 (*) 3.87 - 5.11 MIL/uL   Hemoglobin 9.4 (*) 12.0 - 15.0 g/dL   HCT 96.7 (*) 59.1 - 63.8 %   MCV 93.2  78.0 - 100.0 fL   MCH 30.2  26.0 - 34.0 pg   MCHC 32.4  30.0 - 36.0 g/dL   RDW 46.6 (*) 59.9 - 35.7 %   Platelets 322  150 - 400 K/uL  GLUCOSE, CAPILLARY     Status: Abnormal   Collection Time    05/13/14 11:31 AM      Result Value Ref Range   Glucose-Capillary 180 (*) 70 - 99 mg/dL  GLUCOSE, CAPILLARY     Status: Abnormal   Collection Time    05/13/14  5:00 PM      Result Value Ref Range   Glucose-Capillary 131 (*) 70 - 99 mg/dL  GLUCOSE, CAPILLARY     Status: Abnormal   Collection Time    05/13/14  9:36 PM      Result Value Ref Range   Glucose-Capillary 151 (*) 70 - 99 mg/dL   Comment 1 Notify RN     Comment 2 Documented in Chart    MAGNESIUM     Status: Abnormal   Collection Time    05/14/14  5:21 AM      Result Value Ref Range   Magnesium 0.6 (*) 1.5 - 2.5 mg/dL  CBC     Status: Abnormal   Collection Time    05/14/14  5:21 AM      Result Value Ref Range   WBC 15.4 (*) 4.0 - 10.5 K/uL   RBC 2.85 (*) 3.87 - 5.11 MIL/uL   Hemoglobin 8.8 (*) 12.0 - 15.0 g/dL   HCT 01.7 (*) 79.3 - 90.3 %   MCV 96.1  78.0 - 100.0 fL   MCH 30.9  26.0 - 34.0 pg   MCHC 32.1  30.0 - 36.0 g/dL     RDW 00.9 (*) 23.3 - 15.5 %   Platelets 298  150 - 400 K/uL  COMPREHENSIVE METABOLIC PANEL     Status: Abnormal   Collection Time    05/14/14  5:21 AM      Result Value Ref Range   Sodium 147  137 - 147 mEq/L   Potassium 3.6 (*) 3.7 - 5.3 mEq/L   Chloride 119 (*) 96 - 112 mEq/L   CO2 18 (*) 19 - 32 mEq/L   Glucose, Bld 75  70 - 99 mg/dL   BUN 4 (*) 6 - 23 mg/dL   Creatinine, Ser 0.07  0.50 - 1.10 mg/dL   Calcium 6.8 (*) 8.4 - 10.5 mg/dL   Total Protein 4.2 (*) 6.0 - 8.3 g/dL   Albumin 1.5 (*) 3.5 - 5.2 g/dL   AST 21  0 - 37 U/L   ALT 16  0 - 35 U/L   Alkaline Phosphatase 104  39 - 117 U/L  Total Bilirubin <0.2 (*) 0.3 - 1.2 mg/dL   GFR calc non Af Amer >90  >90 mL/min   GFR calc Af Amer >90  >90 mL/min   Anion gap 10  5 - 15  GLUCOSE, CAPILLARY     Status: Abnormal   Collection Time    05/14/14  7:55 AM      Result Value Ref Range   Glucose-Capillary 69 (*) 70 - 99 mg/dL   No results found.  Assessment/Plan: Diagnosis: Deconditioning secondary to systemic inflammatory response Syndrome, in a patient with chronic malnutrition 1. Does the need for close, 24 hr/day medical supervision in concert with the patient's rehab needs make it unreasonable for this patient to be served in a less intensive setting? Yes 2. Co-Morbidities requiring supervision/potential complications: Tachycardia, diabetes, peripheral neuropathy diabetic verses alcoholrelated, history of pancreatitis, history of cirrhosis of the liver 3. Due to bladder management, bowel management, safety, skin/wound care, disease management, medication administration, pain management and patient education, does the patient require 24 hr/day rehab nursing? Yes 4. Does the patient require coordinated care of a physician, rehab nurse, PT (1-2 hrs/day, 5 days/week) and OT (1-2 hrs/day, 5 days/week) to address physical and functional deficits in the context of the above medical diagnosis(es)? Yes Addressing deficits in the  following areas: balance, endurance, locomotion, strength, transferring, bowel/bladder control, bathing, dressing, feeding, grooming and toileting 5. Can the patient actively participate in an intensive therapy program of at least 3 hrs of therapy per day at least 5 days per week? Yes 6. The potential for patient to make measurable gains while on inpatient rehab is good 7. Anticipated functional outcomes upon discharge from inpatient rehab are modified independent and Short distances household  with PT, modified independent, supervision and ADLs with OT, n/a with SLP. 8. Estimated rehab length of stay to reach the above functional goals is: 7-10 days 9. Does the patient have adequate social supports to accommodate these discharge functional goals? Yes and Potentially 10. Anticipated D/C setting: Home 11. Anticipated post D/C treatments: HH therapy 12. Overall Rehab/Functional Prognosis: good  RECOMMENDATIONS: This patient's condition is appropriate for continued rehabilitative care in the following setting: CIR Patient has agreed to participate in recommended program. Yes Note that insurance prior authorization may be required for reimbursement for recommended care.  Comment:  avoid narcotic analgesics as well as benzodiazepines given history of alcohol abuse    05/14/2014

## 2014-05-14 NOTE — Progress Notes (Signed)
Dr. Eilleen Kempf office contacted for input on pain regimen--- On 04/28/14 was Rx Vicodin 10/325 mg one every 6 hours prn # 120 pills. Also given one time Rx--Dilaudid 8 mg # 30 pills as rescue to be used prn (last time 2014)

## 2014-05-14 NOTE — Progress Notes (Signed)
ANTICOAGULATION CONSULT NOTE - Initial Consult  Pharmacy Consult for Apixaban Indication: pulmonary embolus  Allergies  Allergen Reactions  . Iohexol Other (See Comments)    "severe burning" Patient has received Contrast in 2005 with 13 hour pre-medication, and had no reaction at that time  . Penicillins Hives    Patient Measurements: Height: 5\' 5"  (165.1 cm) Weight: 109 lb 9.1 oz (49.7 kg) IBW/kg (Calculated) : 57 Heparin Dosing Weight:   Vital Signs: Temp: 99.4 F (37.4 C) (08/14 1620) Temp src: Oral (08/14 1620) BP: 136/81 mmHg (08/14 1620) Pulse Rate: 113 (08/14 1620)  Labs:  Recent Labs  05/12/14 0354 05/13/14 0900 05/14/14 0521  HGB  --  9.4* 8.8*  HCT  --  29.0* 27.4*  PLT  --  322 298  CREATININE 0.60 0.59 0.60    Estimated Creatinine Clearance: 62.3 ml/min (by C-G formula based on Cr of 0.6).   Medical History: Past Medical History  Diagnosis Date  . Glaucoma   . Diabetes mellitus without complication   . Hypertension   . Neuropathy   . Crohn disease   . Asthma     Medications:  Scheduled:  . antiseptic oral rinse  7 mL Mouth Rinse BID  . apixaban  10 mg Oral BID   Followed by  . [START ON 05/19/2014] apixaban  5 mg Oral BID  . calcium carbonate  2 tablet Oral TID  . feeding supplement (ENSURE)  1 Container Oral TID BM  . [START ON 05/15/2014] folic acid  1 mg Oral Daily  . gabapentin  400 mg Oral BID   And  . [START ON 05/15/2014] gabapentin  600 mg Oral Q24H  . [START ON 05/15/2014] loratadine  10 mg Oral Daily  . [START ON 05/15/2014] magnesium oxide  400 mg Oral BID  . [START ON 05/15/2014] pantoprazole  40 mg Oral Daily  . potassium chloride  10 mEq Oral BID  . [START ON 05/15/2014] sulfaSALAzine  1,000 mg Oral Q breakfast  . traZODone  50 mg Oral QHS  . venlafaxine  25 mg Oral BID WC    Assessment: 53yo female with (+)PE, continuing Apixaban on transfer to Rehab unit. Hg 8.8 this AM, has been drifting down over last several days but  no bleeding noted.  Pltc wnl.  Pharmacy was also consulted to manage Calcium on floor; she had received Ca 2g on 8/11 & 8/13, and Magnesium was also replaced with a 2gm bolus today.  She is receiving MgO bid.  Goal of Therapy:  Treatment of PE and Corrected Ca > 8.4 Monitor platelets by anticoagulation protocol: Yes   Plan:  1-  Continue Apixaban 10mg  bid, then decrease to 5mg  bid on 8/19 2-  Monitor s/s of bleeding 3-  F/U Calcium and Mg levels in AM  Marisue Humble, PharmD Clinical Pharmacist [facility]

## 2014-05-14 NOTE — Progress Notes (Signed)
Patient was transferred to Rehab Unit [phone]  Report was given to nurse Elijah Birk, RN. Patient will be transported to the new unit via wheelchair.

## 2014-05-14 NOTE — Progress Notes (Signed)
[facility] PROGRESS NOTE  [name] SJG:[id number] DOB: 04/23/1961 DOA: 05/07/2014 PCP: Benito Mccreedy, MD Brief narrative is a 53 y.o. female with DM, HTN, Crohn's disease who smoke presented following an episode of syncope today and reports that she has been passing out frequently for 3 wks. She has been lightheaded while walking. She does not have seizure like activity when she passed out.  She is found to be hypotensive and tachycardic with Lactic acidosis and leukocytosis. She has been vomiting and having loose stools for months but her symptoms have been worse over the past couple of weeks. No blood noted in vomitus or in stool. She has lost a significant amount of weight but unable to quantify. She admitted to fevers, shortness of breath. she was admitted for further evaluation and management.  Assessment/Plan: Nausea / Vomiting and diarrhea  -improved -likely viral gastroenteritis,  -suspect a component of some chronic diarrhea due to chronic pancreatitis -c dif neg, GI pathogen panel negative -per GI, no evidence of active crohns  -improved, continue PRN Imodium, tolerating diet -GI signed off, recommended FU with Dr.Hung   new dilatation of pancreatic duct/ Chronic dilatation/Elevated alk Phos  -has chronic CBD dilatation and new dilatation of pancreatic duct on CT- also noted to have an atrophic pancreas - -MRI/MRCP attempted and she declined this on 8/11, 8/13,will defer to Dr.Hung  SIRS (systemic inflammatory response syndrome)  - source likely 1, improved - CXR clear , stopped levaquin since no clear indication for this - c dif and GI pathogen panel neg and per GI as above no evidence of active crohns -leukocytosis, likely worsened by steroids ( received prior to CTA to r/o PE)  improving  - blood cultures to date with no growth, UA negative   Acute PE -CT angio with bil PEs, s/p 3 days of therapeutic lovenox -discussed oral anticoagulation options and she  specifically wants Eliquis -transitioned to Eliquis 8/12   Syncope  - suspect orthostatic hypotension- CT head negative -BP still soft, but stable per pt baseline BP low  ARF  -resolved with hydration  History of DIABETES MELLITUS, TYPE II with neuropathy  -Stop Insulin- A1c nl at 4.9   ASTHMA  - she is no longer taking Spiriva (she states causes vomiting??)  - continue PRN Albuterol nebs   Crohn's disease  -cont Sulfasalazine  -GI consulted>> no evidence of active crohns  Severe Protein Calorie Malnutrition -nutritional supplements, follow  Hypocalcemia -phos 1.7, parathyroid hormone, vitamin D. 25-hydroxy normal - corrected calcium is higher, replace Phos -Replaced IV and PO, improving now  Metabolic acidosis,  -due to 1,3 -improving now  Pt/OT, ambulate  Code Status: Full Family Communication: None at the bedside Disposition Plan: CIR vs ST-rehab    Consultants:  GI  Procedures:  None  Antibiotics:  Levaquin started 8/7   Flagyl started 8/7>>8/9  HPI/Subjective: Feels better, no complaints, diarrhea improving with immodiurm, tolerating diet, wants to go to CIR  Objective: Filed Vitals:   05/14/14 0539  BP: 92/68  Pulse: 120  Temp: 98 F (36.7 C)  Resp: 20    Intake/Output Summary (Last 24 hours) at 05/14/14 1042 Last data filed at 05/14/14 0300  Gross per 24 hour  Intake    480 ml  Output      0 ml  Net    480 ml   Filed Weights   05/09/14 0527 05/11/14 0451 05/12/14 0430  Weight: 39.962 kg (88 lb 1.6 oz) 39.8 kg (87 lb 11.9 oz) 39  kg (85 lb 15.7 oz)    Exam:  General: alert & oriented x 3, no distress, chronically ill appearing Cardiovascular: RRR, nl S1 s2 Respiratory: CTAB Abdomen: soft +BS NT/ND, no masses palpable Extremities: No cyanosis and no edema    Data Reviewed: Basic Metabolic Panel:  Recent Labs Lab 05/10/14 0930 05/11/14 0331 05/11/14 2208 05/12/14 0354 05/13/14 0900 05/14/14 0521  NA 144 142  --  146  146 147  K 3.7 3.2*  --  3.1* 3.6* 3.6*  CL 117* 115*  --  118* 117* 119*  CO2 15* 13*  --  15* 15* 18*  GLUCOSE 72 68*  --  95 96 75  BUN 5* 5*  --  5* 3* 4*  CREATININE 0.68 0.62  --  0.60 0.59 0.60  CALCIUM 5.2* 5.4*  --  6.0* 6.0* 6.8*  MG  --   --   --   --   --  0.6*  PHOS  --   --  1.7*  --   --   --    Liver Function Tests:  Recent Labs Lab 05/07/14 2102 05/08/14 0406 05/12/14 0354 05/14/14 0521  AST 32 27 30 21   ALT 31 23 19 16   ALKPHOS 219* 183* 107 104  BILITOT 0.4 0.3 <0.2* <0.2*  PROT 6.0 4.9* 4.1* 4.2*  ALBUMIN 2.5* 2.0* 1.5* 1.5*    Recent Labs Lab 05/07/14 2102  LIPASE 11   No results found for this basename: AMMONIA,  in the last 168 hours CBC:  Recent Labs Lab 05/07/14 2102 05/08/14 0406 05/09/14 0339 05/11/14 0331 05/13/14 0900 05/14/14 0521  WBC 17.5* 14.5* 15.6* 11.9* 15.5* 15.4*  NEUTROABS 13.9*  --   --   --   --   --   HGB 12.3 10.3* 9.0* 9.4* 9.4* 8.8*  HCT 36.9 31.2* 27.9* 30.4* 29.0* 27.4*  MCV 90.9 90.2 93.3 93.5 93.2 96.1  PLT 348 292 306 280 322 298   Cardiac Enzymes: No results found for this basename: CKTOTAL, CKMB, CKMBINDEX, TROPONINI,  in the last 168 hours BNP (last 3 results) No results found for this basename: PROBNP,  in the last 8760 hours CBG:  Recent Labs Lab 05/13/14 1131 05/13/14 1700 05/13/14 2136 05/14/14 0755 05/14/14 0830  GLUCAP 180* 131* 151* 69* 103*    Recent Results (from the past 240 hour(s))  URINE CULTURE     Status: None   Collection Time    05/07/14 11:13 PM      Result Value Ref Range Status   Specimen Description URINE, CATHETERIZED   Final   Special Requests NONE   Final   Culture  Setup Time     Final   Value: 05/08/2014 00:46     Performed at [facility] Count     Final   Value: NO GROWTH     Performed at [facility]   Culture     Final   Value: NO GROWTH     Performed at [facility]   Report Status 05/09/2014 FINAL   Final  CULTURE,  BLOOD (ROUTINE X 2)     Status: None   Collection Time    05/08/14 12:25 AM      Result Value Ref Range Status   Specimen Description BLOOD RIGHT FOREARM   Final   Special Requests BOTTLES DRAWN AEROBIC AND ANAEROBIC [facility] EACH   Final   Culture  Setup Time     Final  Value: 05/08/2014 11:23     Performed at [facility]   Culture     Final   Value: NO GROWTH 5 DAYS     Performed at [facility]   Report Status 05/14/2014 FINAL   Final  CULTURE, BLOOD (ROUTINE X 2)     Status: None   Collection Time    05/08/14 12:35 AM      Result Value Ref Range Status   Specimen Description BLOOD LEFT FOREARM   Final   Special Requests BOTTLES DRAWN AEROBIC ONLY 3CC   Final   Culture  Setup Time     Final   Value: 05/08/2014 11:24     Performed at [facility]   Culture     Final   Value: NO GROWTH 5 DAYS     Performed at [facility]   Report Status 05/14/2014 FINAL   Final  MRSA PCR SCREENING     Status: None   Collection Time    05/08/14  1:06 AM      Result Value Ref Range Status   MRSA by PCR NEGATIVE  NEGATIVE Final   Comment:            The GeneXpert MRSA Assay (FDA     approved for NASAL specimens     only), is one component of a     comprehensive MRSA colonization     surveillance program. It is not     intended to diagnose MRSA     infection nor to guide or     monitor treatment for     MRSA infections.  CLOSTRIDIUM DIFFICILE BY PCR     Status: None   Collection Time    05/08/14  4:19 PM      Result Value Ref Range Status   C difficile by pcr NEGATIVE  NEGATIVE Final  STOOL CULTURE     Status: None   Collection Time    05/08/14  4:19 PM      Result Value Ref Range Status   Specimen Description STOOL   Final   Special Requests NONE   Final   Culture     Final   Value: NO SALMONELLA, SHIGELLA, CAMPYLOBACTER, YERSINIA, OR E.COLI 0157:H7 ISOLATED     Note: REDUCED NORMAL FLORA PRESENT     Performed at [facility]   Report Status  05/12/2014 FINAL   Final     Studies: No results found.  Scheduled Meds: . antiseptic oral rinse  7 mL Mouth Rinse BID  . apixaban  10 mg Oral BID   Followed by  . [START ON 05/19/2014] apixaban  5 mg Oral BID  . calcium carbonate  2 tablet Oral TID  . feeding supplement (ENSURE)  1 Container Oral TID BM  . folic acid  1 mg Oral Daily  . gabapentin  400 mg Oral BID   And  . gabapentin  600 mg Oral Q24H  . loratadine  10 mg Oral Daily  . LORazepam  1 mg Intravenous Once  . ondansetron (ZOFRAN) IV  4 mg Intravenous Q6H  . pantoprazole  40 mg Oral Daily  . sodium chloride  3 mL Intravenous Q12H  . sulfaSALAzine  1,000 mg Oral Q breakfast  . traZODone  50 mg Oral QHS  . venlafaxine  25 mg Oral BID WC   Continuous Infusions:    Principal Problem:   SIRS (systemic inflammatory response syndrome) Active Problems:   DIABETES MELLITUS, TYPE  II   ASTHMA   Acute respiratory failure   Crohn's disease   Nicotine abuse    Time spent:3mn    Sophiana Milanese  [facility] Pager [phone] 7PM-7AM, please contact night-coverage at [email or web address], password [email or web address]8/14/2015, 10:42 AM  LOS: 7 days

## 2014-05-14 NOTE — Progress Notes (Signed)
Patient ID: [name], female   DOB: December 12, 1960, 53 y.o.   MRN: [id number] Pt is admitted to room 4W07 from 5 west.  Admission vital are stable

## 2014-05-14 NOTE — Progress Notes (Signed)
PMR Admission Coordinator Pre-Admission Assessment   Patient: [name] is an 53 y.o., female MRN: [id number] DOB: [birth year] Height: 5\' 6"  (167.6 cm) Weight: 39 kg (85 lb 15.7 oz)                                                                                                                                                  Insurance Information HMO:     PPO:      PCP:      IPA:      80/20: yes     OTHER: no HMO PRIMARY: medicare a and b      Policy#: [id number]      Subscriber: pt Benefits:  Phone #: online palmetto     Name: 05/14/14 Eff. Date: 03/02/11     Deduct: $1260      Out of Pocket Max: none      Life Max: none CIR: 100%      SNF: 20 full days Outpatient: 80%     Co-Pay: 20% Home Health: 100%      Co-Pay: none DME: 80%     Co-Pay: 20% Providers: pt choice  SECONDARY: Medicaid [place] [facility]      Policy#: [id number]      Subscriber: pt   Medicaid Application Date:       Case Manager:  Disability Application Date:       Case Worker:    Emergency Contact Information Contact Information     Name  Relation  Home  Work  Mobile     [place]  Son  [phone]         Marquita Palms  [phone]    [phone]       Current Medical History  Patient Admitting Diagnosis:Deconditioning secondary to systemic inflammatory response Syndrome, in a patient with chronic malnutrition   History of Present Illness: [name] is a 53 y.o. 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    She was admitted on 05/07/14 for work up and found to be hypotensive, tachycardic with elevated WBC and lactic acidosis. She was dehydrated and hypoxic and started on IV antibiotics for SIRS. BC X 2 and stools negative. CT head negative for acute changes. CT chest with acute bilateral PE and  esophagus distended and fluid filled and lovenox added for treatment and patient wants eliquis for treatment. She was placed on liquid diet and Dr. Rhea Belton consulted for input. He recommended MRI pancreas as well as MRCP due to h/o chronic pancreatitis. Patient refused MRI study X 2 despite sedation and GI has signed off.   She has had improvement of GI symptoms and tolerated slow advancement of diet. SIRS resolved--unclear  source. Syncope likely due to orthostatic hypotension and dehydration. Electrolyte abnormality treated and insulin discontinued due to hypoglycemic episodes--Hgb A1c-4.9.   Patient with anorexia and significant amount of weight loss in the past few months. She reports that she has been getting sick past 2 months with nausea due to multiple medication changes and d/c of antiemetic.   Denies alcohol use since 2004, several urine alcohol toxicology performed over last several years have been negative. Urine toxicology has been positive for opiates in 2012 in 2014      Past Medical History  Past Medical History   Diagnosis  Date   .  Glaucoma     .  Diabetes mellitus without complication     .  Hypertension     .  Neuropathy     .  Crohn disease     .  Asthma        Family History  family history is not on file.   Prior Rehab/Hospitalizations: went to [facility] 10/2012 for 3 days. Checked herself out.         Current Medications  Current facility-administered medications:acetaminophen (TYLENOL) suppository 650 mg, 650 mg, Rectal, Q6H PRN, [name] Cantor, MD;  acetaminophen (TYLENOL) tablet 650 mg, 650 mg, Oral, Q6H PRN, [name] Cantor, MD;  albuterol (PROVENTIL) (2.5 MG/3ML) 0.083% nebulizer solution 2.5 mg, 2.5 mg, Nebulization, Q4H PRN, [name] Cantor, MD;  ALPRAZolam Prudy Feeler) tablet 2 mg, 2 mg, Oral, TID PRN, [name] Cantor, MD, 2 mg at 05/13/14 2333 alum & mag hydroxide-simeth (MAALOX/MYLANTA) 200-200-20 MG/5ML suspension 30 mL, 30 mL, Oral, Q6H PRN, [name] Cantor, MD;   amitriptyline (ELAVIL) tablet 50 mg, 50 mg, Oral, QHS PRN, [name] Cantor, MD;  antiseptic oral rinse (CPC / CETYLPYRIDINIUM CHLORIDE 0.05%) solution 7 mL, 7 mL, Mouth Rinse, BID, [name] Cantor, MD, 7 mL at 05/14/14 0957;  apixaban (ELIQUIS) tablet 10 mg, 10 mg, Oral, BID, [name] Cove, MD, 10 mg at 05/14/14 0955 [START ON 05/19/2014] apixaban (ELIQUIS) tablet 5 mg, 5 mg, Oral, BID, [name] Cove, MD;  calcium carbonate (TUMS - dosed in mg elemental calcium) chewable tablet 400 mg of elemental calcium, 2 tablet, Oral, TID, [name] Cove, MD, 400 mg of elemental calcium at 05/14/14 0956;  feeding supplement (ENSURE) (ENSURE) pudding 1 Container, 1 Container, Oral, TID BM, [name], RD, 1 Container at 05/14/14 1610 folic acid (FOLVITE) tablet 1 mg, 1 mg, Oral, Daily, [name] Cantor, MD, 1 mg at 05/14/14 0956;  gabapentin (NEURONTIN) capsule 400 mg, 400 mg, Oral, BID, [name] Cove, MD, 400 mg at 05/14/14 9604;  gabapentin (NEURONTIN) tablet 600 mg, 600 mg, Oral, Q24H, [name] Cove, MD;  HYDROcodone-acetaminophen (NORCO) 10-325 MG per tablet 1 tablet, 1 tablet, Oral, Q6H PRN, [name] Cantor, MD, 1 tablet at 05/13/14 2045 HYDROmorphone (DILAUDID) tablet 8 mg, 8 mg, Oral, BID PRN, [name] Cantor, MD, 8 mg at 05/14/14 0849;  loperamide (IMODIUM) capsule 2 mg, 2 mg, Oral, PRN, [name] Millin, MD, 2 mg at 05/12/14 0434;  loratadine (CLARITIN) tablet 10 mg, 10 mg, Oral, Daily, [name] Cantor, MD, 10 mg at 05/14/14 0956;  LORazepam (ATIVAN) injection 1 mg, 1 mg, Intravenous, Once, [name] Cove, MD magnesium sulfate IVPB 2 g 50 mL, 2 g, Intravenous, Once, [name] Cove, MD, 2 g at 05/14/14 1316;  ondansetron (ZOFRAN) injection 4 mg, 4 mg, Intravenous, Q6H PRN, [name] Cantor, MD, 4 mg at 05/08/14 1100;  ondansetron (ZOFRAN) injection 4 mg, 4 mg, Intravenous, Q6H, [name] Rizwan, MD, 4 mg at 05/14/14 1213;  ondansetron (ZOFRAN)  tablet 4 mg, 4 mg, Oral, Q6H PRN, [name] Cantor, MD, 4 mg at 05/08/14 1726 pantoprazole  (PROTONIX) EC tablet 40 mg, 40 mg, Oral, Daily, [name], RPH, 40 mg at 05/14/14 0955;  promethazine (PHENERGAN) injection 12.5 mg, 12.5 mg, Intravenous, Q6H PRN, [name] Cantor, MD;  sodium chloride 0.9 % bolus 1,000 mL, 1,000 mL, Intravenous, PRN, [name] Cantor, MD;  sodium chloride 0.9 % injection 3 mL, 3 mL, Intravenous, Q12H, [name] Rizwan, MD, 3 mL at 05/14/14 0957 sulfaSALAzine (AZULFIDINE) tablet 1,000 mg, 1,000 mg, Oral, Q breakfast, [name] Cantor, MD, 1,000 mg at 05/14/14 1610;  traZODone (DESYREL) tablet 50 mg, 50 mg, Oral, QHS, [name] Rizwan, MD, 50 mg at 05/13/14 2317;  venlafaxine [facility]) tablet 25 mg, 25 mg, Oral, BID WC, [name] Cantor, MD, 25 mg at 05/14/14 9604   Patients Current Diet: General. Pt with significant weight loss over last few years. She minimizes it. Reportedly eats one meal a day.   Precautions / Restrictions Precautions Precautions: Fall Precaution Comments: Back precautions secondary to severe back and neck pain with mobility. Restrictions Weight Bearing Restrictions: No    Prior Activity Level Household: very limited over last 3 months. Uses cane , walker or furniture walks up until 3 to 4 weeks ago. Son carries her up two flights of stairs into their apartment. Sponge bathes herself with PCS aide assist. Her bed is very high bed and she uses stool for entry into bed.    Home Assistive Devices / Equipment Home Assistive Devices/Equipment: Bedside commode/3-in-1;Cane (specify quad or straight);CBG Meter;Other (Comment) (cbg meter has been broken for one month) Home Equipment: Walker - 2 wheels;Cane - single point;Bedside commode CBG monitor is broken   Prior Functional Level Prior Function Level of Independence: Needs assistance Gait / Transfers Assistance Needed: Pt reports being nonambulatory for ~1 month. Finished HHPT in June and since then has had a recent decline in mobility. Increased falls when ambulating with RW. ADL's / Homemaking  Assistance Needed: Pt reports using diapers recently. Assistance helps with ADLs as requested. Pt does not like to ask for help. Pt reports she has not been eating recently. Comments: bedbound for 3 to 4 weeks, high bed with stool to get up . furniture walke to bathroom when able. Gets her PCS aide to put everything in reach    Current Functional Level Cognition   Overall Cognitive Status: Within Functional Limits for tasks assessed Orientation Level: Oriented X4     Extremity Assessment (includes Sensation/Coordination)          ADLs        Mobility   Overal bed mobility: Needs Assistance Bed Mobility: Rolling;Sidelying to Sit;Sit to Sidelying Rolling: Modified independent (Device/Increase time) Sidelying to sit: Mod assist;HOB elevated Sit to sidelying: Min assist;HOB elevated General bed mobility comments: Rolling to R/L x3 with rail for support; Sidelying to sit x2 with UEs manually mobilizing BLEs to EOB, assist with trunk and scooting bottom to EOB. Sit to sidelying x2 with pt manually lifting BLEs into bed. VC for technique. Assist with BLEs on second transfer to bed.      Transfers   Overall transfer level: Needs assistance Equipment used: Rolling walker (2 wheeled) Transfers: Sit to/from [employer] Sit to Stand: Mod assist Squat pivot transfers: Min assist General transfer comment: Stood from EOB x1 with therapist stabilizing B feet from sliding forward, VC for hand placement and assist with anterior translation/hip extension. Increased tremoring BUEs/LEs and trunk upon standing Mod A for balance.  SPT bed <->BSC with VC for hand placement/technique,      Ambulation / Gait / Stairs / Wheelchair Mobility   Ambulation/Gait General Gait Details: TBA.      Posture / Balance  Dynamic Sitting Balance Sitting balance - Comments: Requires BUEs support sitting EOB to maintain balance, occasional LOB posteriorly and to the left with dynamic activities when not  supported. Increased tremoring/shaking (ataxia?) noted EOB in trunk and BUEs/LEs. Able to reduce tremoring with approximation inconsistently. Min guard assist -Min A for support EOB.      Special needs/care consideration  Continuous Drip IV KVO for receiving IV meds Bowel mgmt: using diapers pta Bladder mgmt:using diapers pta. Do not know if this is due to decrease in functional mobility or incontinent Diabetic mgmt yes. Meter broken for one month      Previous Home Environment Living Arrangements: Children;Other (Comment) 32(19 yo son live with pt)  Lives With: Son Available Help at Discharge: Other (Comment);Family (son and PCS aide, not 24/7 assist) Type of Home: Apartment Home Layout: One level Home Access: Stairs to enter Entrance Stairs-Rails: Right;Left Entrance Stairs-Number of Steps: 2 flights of steps with landing. Bathroom Shower/Tub: Associate ProfessorTub/shower unit Bathroom Toilet: Standard Bathroom Accessibility: Yes How Accessible: Accessible via walker Home Care Services: Yes Type of Home Care Services: Homehealth aide Additional Comments:  (son 55 yo unemployed, but not home much.PCS aide 3 hrs per d)   Discharge Living Setting Plans for Discharge Living Setting: Apartment Type of Home at Discharge: Apartment Discharge Home Layout: One level Discharge Home Access: Stairs to enter Entrance Stairs-Rails: Right;Left;Can reach both Entrance Stairs-Number of Steps: 2 flights with a landing Discharge Bathroom Shower/Tub: Tub/shower unit Discharge Bathroom Toilet: Standard Discharge Bathroom Accessibility: Yes How Accessible: Accessible via walker Does the patient have any problems obtaining your medications?: No   Social/Family/Support Systems Patient Roles: Parent Contact Information: BoeingMylik Mcliton, 55 yo son Anticipated Caregiver: son and PCS aide Anticipated Industrial/product designerCaregiver's Contact Information: see above Ability/Limitations of Caregiver: son unemployed but in and out Caregiver  Availability: Intermittent Discharge Plan Discussed with Primary Caregiver: Yes Is Caregiver In Agreement with Plan?: Yes Does Caregiver/Family have Issues with Lodging/Transportation while Pt is in Rehab?: No   Goals/Additional Needs Patient/Family Goal for Rehab: MOd I short distances household, mod I to supervision with ADLS Expected length of stay: ELOS 7 to 10 days Equipment Needs: home CBG meter broken for one month Special Service Needs: Pt requesting assist to obtain new PCP. [facility]Bonita [facility]. Linda PCS aide for 3 months. Before it was her nephew who lives next door, but they parted ways as her caregiver. Son does not have transportation, so d/c home transportation will be needed Pt/Family Agrees to Admission and willing to participate: Yes Program Orientation Provided & Reviewed with Pt/Caregiver Including Roles  & Responsibilities: Yes   Decrease burden of Care through IP rehab admission: n/a   Possible need for SNF placement upon discharge:no   Patient Condition: This patient's condition remains as documented in the consult dated 05/14/14, in which the Rehabilitation Physician determined and documented that the patient's condition is appropriate for intensive rehabilitative care in an inpatient rehabilitation facility. Will admit to inpatient rehab today.   Preadmission Screen Completed By:  [name] DupesBoyette, [name], 05/14/2014 2:00 PM ______________________________________________________________________    Discussed status with Dr. Wynn BankerKirsteins on 05/14/14 at  1359 and received telephone approval for admission today.   Admission Coordinator:  [name] DupesBoyette, [name], time  1359 Date 05/14/14.          Cosigned by: Erick Colace, MD    [05/14/2014 2:02 PM]  Revision History...     Date/Time User Action   05/14/2014 2:02 PM Erick Colace, MD Cosign   05/14/2014 2:01 PM [name] Dupes, RN Sign    05/14/2014 2:00 PM [name] Dupes, RN Sign  View Details Report

## 2014-05-15 ENCOUNTER — Inpatient Hospital Stay (HOSPITAL_COMMUNITY): Payer: Medicare Other | Admitting: Physical Therapy

## 2014-05-15 ENCOUNTER — Inpatient Hospital Stay (HOSPITAL_COMMUNITY): Payer: Medicare Other | Admitting: Occupational Therapy

## 2014-05-15 DIAGNOSIS — G9340 Encephalopathy, unspecified: Secondary | ICD-10-CM

## 2014-05-15 DIAGNOSIS — R5381 Other malaise: Secondary | ICD-10-CM

## 2014-05-15 DIAGNOSIS — K5090 Crohn's disease, unspecified, without complications: Secondary | ICD-10-CM

## 2014-05-15 DIAGNOSIS — F411 Generalized anxiety disorder: Secondary | ICD-10-CM

## 2014-05-15 LAB — MAGNESIUM: Magnesium: 1.4 mg/dL — ABNORMAL LOW (ref 1.5–2.5)

## 2014-05-15 MED ORDER — HYDROMORPHONE HCL 2 MG PO TABS
4.0000 mg | ORAL_TABLET | Freq: Three times a day (TID) | ORAL | Status: DC | PRN
  Administered 2014-05-15 – 2014-05-17 (×5): 4 mg via ORAL
  Filled 2014-05-15 (×5): qty 2

## 2014-05-15 NOTE — Discharge Instructions (Addendum)
Inpatient Rehab Discharge Instructions  [name] Discharge date and time: 05/21/14   Activities/Precautions/ Functional Status: Activity: activity as tolerated Diet: diabetic diet Wound Care: none needed  Functional status:  ___ No restrictions     ___ Walk up steps independently ___ 24/7 supervision/assistance   ___ Walk up steps with assistance ___ Intermittent supervision/assistance  ___ Bathe/dress independently _X__ Walk with walker    ___ Bathe/dress with assistance ___ Walk Independently    _X__ Shower independently ___ Walk with assistance    ___ Shower with assistance _X__ No alcohol     ___ Return to work/school ________  Special Instructions: 1. Do not use Lisinopril or Amaryl. Follow current medication list and follow up with your MD for further changes.   2. Continue to use lasix 2-3 times a week as at home.     COMMUNITY REFERRALS UPON DISCHARGE:    Home Health:   PT, OT, RN, SW    Agency:[facility]  Phone:[phone] Date of last service:05/21/2014  Medical Equipment/Items Ordered:TUB BENCH & BEDSIDE COMMODE  Agency/Supplier:[facility]    [phone] Other:AWARE TO CHANGE PCP ON MEDICAID CARD BEFORE CAN SWITCH-PT TO FOLLOW UP WITH    My questions have been answered and I understand these instructions. I will adhere to these goals and the provided educational materials after my discharge from the hospital.  Patient/Caregiver Signature _______________________________ Date __________  Clinician Signature _______________________________________ Date __________  Please bring this form and your medication list with you to all your follow-up doctor's appointments.  Information on my medicine - ELIQUIS (apixaban)  This medication education was reviewed with me or my healthcare representative as part of my discharge preparation.  The pharmacist that spoke with me during my hospital stay was:  Bajbus, Lauren, RPH  Why was Eliquis prescribed  for you? Eliquis was prescribed to treat blood clots that may have been found in the veins of your legs (deep vein thrombosis) or in your lungs (pulmonary embolism) and to reduce the risk of them occurring again.  What do You need to know about Eliquis ? The starting dose is 10 mg (two 5 mg tablets) taken TWICE daily for the FIRST SEVEN (7) DAYS, then on 05/19/2014  the dose is reduced to ONE 5 mg tablet taken TWICE daily.  Eliquis may be taken with or without food.   Try to take the dose about the same time in the morning and in the evening. If you have difficulty swallowing the tablet whole please discuss with your pharmacist how to take the medication safely.  Take Eliquis exactly as prescribed and DO NOT stop taking Eliquis without talking to the doctor who prescribed the medication.  Stopping may increase your risk of developing a new blood clot.  Refill your prescription before you run out.  After discharge, you should have regular check-up appointments with your healthcare provider that is prescribing your Eliquis.    What do you do if you miss a dose? If a dose of ELIQUIS is not taken at the scheduled time, take it as soon as possible on the same day and twice-daily administration should be resumed. The dose should not be doubled to make up for a missed dose.  Important Safety Information A possible side effect of Eliquis is bleeding. You should call your healthcare provider right away if you experience any of the following:   Bleeding from an injury or your nose that does not stop.   Unusual colored urine (red  or dark brown) or unusual colored stools (red or black).   Unusual bruising for unknown reasons.   A serious fall or if you hit your head (even if there is no bleeding).  Some medicines may interact with Eliquis and might increase your risk of bleeding or clotting while on Eliquis. To help avoid this, consult your healthcare provider or pharmacist prior to using any new  prescription or non-prescription medications, including herbals, vitamins, non-steroidal anti-inflammatory drugs (NSAIDs) and supplements.  This website has more information on Eliquis (apixaban): http://[email or web address].com/eliquis/home

## 2014-05-15 NOTE — Evaluation (Signed)
Physical Therapy Assessment and Plan  Patient Details  Name: [name] MRN: [id number] Date of Birth: [birth year]  PT Diagnosis: Difficulty walking and Muscle weakness Rehab Potential: Good ELOS: 5 days   Today's Date: 05/15/2014 Time: 4098-1191 Time Calculation (min): 63 min  Problem List:  Patient Active Problem List   Diagnosis Date Noted  . Physical deconditioning 05/14/2014  . SIRS (systemic inflammatory response syndrome) 05/08/2014  . Nicotine abuse 05/08/2014  . Encephalopathy 10/21/2012  . Hypernatremia 10/15/2012  . Hypokalemia 10/15/2012  . Hypomagnesemia 10/15/2012  . Hypophosphatemia 10/15/2012  . Leukocytosis 10/15/2012  . Normocytic anemia 10/15/2012  . Thrombocytopenia 10/15/2012  . Elevated INR 10/15/2012  . Respiratory failure 10/15/2012  . Metabolic acidosis 47/82/9562  . Acute respiratory failure 10/13/2012  . ARDS (adult respiratory distress syndrome) 10/13/2012  . Crohn's disease 10/13/2012  . Bilateral lower abdominal discomfort 10/12/2012  . H1N1 influenza 10/12/2012  . Sepsis 10/11/2012  . Community acquired pneumonia 10/11/2012  . DIABETES MELLITUS, TYPE II 05/12/2007  . ANXIETY 05/12/2007  . DEPRESSION 05/12/2007  . PERIPHERAL NEUROPATHY 05/12/2007  . HYPERTENSION 05/12/2007  . ALLERGIC RHINITIS 05/12/2007  . ASTHMA 05/12/2007  . CIRRHOSIS 05/12/2007  . PANCREATITIS, HX OF 05/12/2007  . GASTROINTESTINAL HEMORRHAGE, HX OF 05/12/2007    Past Medical History:  Past Medical History  Diagnosis Date  . Glaucoma   . Diabetes mellitus without complication   . Hypertension   . Neuropathy   . Crohn disease   . Asthma    Past Surgical History:  Past Surgical History  Procedure Laterality Date  . Cesarean section      Assessment & Plan Clinical Impression: Patient is a 53 y.o. 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 She was admitted on 05/07/14 for work up and found to be hypotensive, tachycardic with elevated WBC and lactic acidosis. She was dehydrated and hypoxic and started on IV antibiotics for SIRS. BC X 2 and stools negative. CT head negative for acute changes. CT chest with acute bilateral PE and esophagus distended and fluid filled and lovenox added for treatment and patient wants eliquis for treatment. She was placed on liquid diet and Dr. Hilarie Fredrickson consulted for input. He recommended MRI pancreas as well as MRCP due to h/o chronic pancreatitis. Patient refused MRI study X 2 despite sedation and GI has signed off.  She has had improvement of GI symptoms and tolerated slow advancement of diet. SIRS resolved--unclear source. Syncope likely due to orthostatic hypotension and dehydration. Electrolyte abnormality treated and insulin discontinued due to hypoglycemic episodes--Hgb A1c-4.9. PT evaluation done yesterday and patient significantly deconditioned with tremors as well as ataxia with attempts at sitting at EOB. Patient has anorexia and significant amount of weight loss in the past few months due to d/c of nausea medication as well as multiple medication changes--d/c anxiolytic and addition of BP meds. Denies alcohol use since 2004, several urine alcohol toxicology performed over last several years have been negative. Urine toxicology has been positive for opiates in 2012 and in 2014 . Stays in the room most of the day and sponge bathes few days a week. Has an aide 4 hrs/5 days and 45 mins on the weekend to help with housework, Meal prep and ADLs. She could transfer independently and ambulate short distances to BR on good days.   Patient transferred  to CIR on 05/14/2014 .   Patient currently requires min with mobility secondary to muscle weakness and decreased cardiorespiratoy endurance.  Prior to hospitalization, patient was modified  independent  with mobility and lived with Son in a [place] home.  Home access is 6 steps with L rail to landing, 6 steps with R rail to apartmentStairs to enter.  Patient will benefit from skilled PT intervention to maximize safe functional mobility, minimize fall risk and decrease caregiver burden for planned discharge home with intermittent assist.  Anticipate patient will benefit from follow up [facility] at discharge.  PT - End of Session Activity Tolerance: Tolerates 30+ min activity with multiple rests Endurance Deficit: Yes PT Assessment Rehab Potential: Good Barriers to Discharge: Decreased caregiver support;Inaccessible home environment PT Patient demonstrates impairments in the following area(s): Balance;Endurance PT Transfers Functional Problem(s): Bed Mobility;Bed to Chair;Car PT Locomotion Functional Problem(s): Ambulation;Stairs PT Plan PT Intensity: Minimum of 1-2 x/day ,45 to 90 minutes PT Frequency: 5 out of 7 days PT Duration Estimated Length of Stay: 5 days PT Treatment/Interventions: Ambulation/gait training;Balance/vestibular training;Discharge planning;DME/adaptive equipment instruction;Functional mobility training;Neuromuscular re-education;Therapeutic Exercise;Therapeutic Activities;Stair training;UE/LE Strength taining/ROM;UE/LE Coordination activities;Wheelchair propulsion/positioning PT Transfers Anticipated Outcome(s): mod I for transfers PT Locomotion Anticipated Outcome(s): mod I for ambulation, S for stairs PT Recommendation Follow Up Recommendations: Home health PT Patient destination: Home Equipment Recommended: To be determined  Skilled Therapeutic Intervention PT evaluation completed and treatment initiated. Pt educated on PT POC, pt verbalized understanding. Pt performed multiple sit to stand transfers with min guard to min A.   PT Evaluation Precautions/Restrictions Precautions Precautions: Fall Precaution Comments: history of back pain   Restrictions Weight Bearing Restrictions: No General Chart Reviewed: Yes Vital Signs  Pain Pt c/o mod to severe back pain, medicated prior to evaluation.   Home Living/Prior Functioning Home Living Available Help at Discharge: Other (Comment);Family Type of Home: Apartment Home Access: Stairs to enter [employer] of Steps: 6 steps with L rail to landing, 6 steps with R rail to apartment Entrance Stairs-Rails: Right;Left Home Layout: One level  Lives With: Son Prior Function Level of Independence: Independent with gait;Independent with transfers Comments:  (pt has a walker although not using at home, furnitture walker, hx of falls) Vision/Perception  WFLs Cognition Overall Cognitive Status: Within Functional Limits for tasks assessed Arousal/Alertness: Awake/alert Orientation Level: Oriented X4 Safety/Judgment: Appears intact Sensation Sensation Light Touch: Impaired by gross assessment Additional Comments: impaired B LEs to light touch Motor  Motor Motor - Skilled Clinical Observations: generalized weakness  Mobility Bed Mobility Bed Mobility: Supine to Sit;Sit to Supine Supine to Sit: HOB flat;Other (comment);4: Min assist (no rails) Sit to Supine: 5: Supervision;HOB flat;Other (comment) (no rails) Transfers Transfers: Yes Sit to Stand: 4: Min assist Stand to Sit: 4: Min assist Stand Pivot Transfers: 4: Min assist Locomotion  Ambulation Ambulation: Yes Ambulation/Gait Assistance: 4: Min assist Ambulation Distance (Feet): 10 Feet Assistive device: 1 person hand held assist Stairs / Additional Locomotion Stairs: Yes Stairs Assistance: 3: Mod assist Stair Management Technique: Two rails Number of Stairs: 4 Wheelchair Mobility Wheelchair Mobility: Yes Wheelchair Assistance: 5: Supervision Wheelchair Propulsion: Both upper extremities Distance: 125  Trunk/Postural Assessment  Cervical Assessment Cervical Assessment:  (forward flexed head  posture) Thoracic Assessment Thoracic Assessment:  (kyphotic posture) Postural Control Postural Control: Within Functional Limits  Balance Balance Balance Assessed: Yes Static Sitting Balance Static Sitting - Balance Support: No upper extremity supported Static Sitting - Level of Assistance: 6: Modified independent (Device/Increase time) Dynamic Sitting Balance Dynamic Sitting -  Balance Support: No upper extremity supported;Feet supported Dynamic Sitting - Level of Assistance: 5: Stand by assistance Static Standing Balance Static Standing - Balance Support: During functional activity Static Standing - Level of Assistance: 4: Min assist Dynamic Standing Balance Dynamic Standing - Level of Assistance: 4: Min assist Extremity Assessment  B UEs as per OT evaluation.    RLE Assessment RLE Assessment: Exceptions to [facility] RLE PROM (degrees) Overall PROM Right Lower Extremity: Within functional limits for tasks assessed RLE Strength RLE Overall Strength: Deficits RLE Overall Strength Comments: grossly assessed 2+/5 to 3-/5 LLE Assessment LLE Assessment: Exceptions to [facility] LLE PROM (degrees) Overall PROM Left Lower Extremity: Within functional limits for tasks assessed LLE Strength LLE Overall Strength: Deficits LLE Overall Strength Comments: grossly 2+/5 to 3-/5  FIM:  FIM - Bed/Chair Transfer Bed/Chair Transfer: 5: Sit > Supine: Supervision (verbal cues/safety issues);4: Chair or W/C > Bed: Min A (steadying Pt. > 75%);4: Bed > Chair or W/C: Min A (steadying Pt. > 75%);4: Supine > Sit: Min A (steadying Pt. > 75%/lift 1 leg) FIM - Locomotion: Wheelchair Distance: 125 Locomotion: Wheelchair: 2: Travels 50 - 149 ft with supervision, cueing or coaxing FIM - Locomotion: Ambulation Locomotion: Ambulation Assistive Devices: Other (comment) (hand hold assist) Ambulation/Gait Assistance: 4: Min assist Locomotion: Ambulation: 1: Travels less than 50 ft with minimal assistance (Pt.>75%) FIM  - Locomotion: Stairs Locomotion: Scientist, physiological: Hand rail - 2 Locomotion: Stairs: 1: Up and Down < 4 stairs with moderate assistance (Pt: 50 - 74%)   Refer to Care Plan for Long Term Goals  Recommendations for other services: None  Discharge Criteria: Patient will be discharged from PT if patient refuses treatment 3 consecutive times without medical reason, if treatment goals not met, if there is a change in medical status, if patient makes no progress towards goals or if patient is discharged from hospital.  The above assessment, treatment plan, treatment alternatives and goals were discussed and mutually agreed upon: by patient  Dub Amis 05/15/2014, 2:12 PM

## 2014-05-15 NOTE — Evaluation (Signed)
Occupational Therapy Assessment and Plan  Patient Details  Name: [name] MRN: [id number] Date of Birth: 12/05/1960  OT Diagnosis: muscle weakness (generalized) Rehab Potential: Rehab Potential: Good ELOS: 5 days   Today's Date: 05/15/2014 Time: 0800-0900 Time Calculation (min): 60 min  Problem List:  Patient Active Problem List   Diagnosis Date Noted  . Physical deconditioning 05/14/2014  . SIRS (systemic inflammatory response syndrome) 05/08/2014  . Nicotine abuse 05/08/2014  . Encephalopathy 10/21/2012  . Hypernatremia 10/15/2012  . Hypokalemia 10/15/2012  . Hypomagnesemia 10/15/2012  . Hypophosphatemia 10/15/2012  . Leukocytosis 10/15/2012  . Normocytic anemia 10/15/2012  . Thrombocytopenia 10/15/2012  . Elevated INR 10/15/2012  . Respiratory failure 10/15/2012  . Metabolic acidosis 40/98/1191  . Acute respiratory failure 10/13/2012  . ARDS (adult respiratory distress syndrome) 10/13/2012  . Crohn's disease 10/13/2012  . Bilateral lower abdominal discomfort 10/12/2012  . H1N1 influenza 10/12/2012  . Sepsis 10/11/2012  . Community acquired pneumonia 10/11/2012  . DIABETES MELLITUS, TYPE II 05/12/2007  . ANXIETY 05/12/2007  . DEPRESSION 05/12/2007  . PERIPHERAL NEUROPATHY 05/12/2007  . HYPERTENSION 05/12/2007  . ALLERGIC RHINITIS 05/12/2007  . ASTHMA 05/12/2007  . CIRRHOSIS 05/12/2007  . PANCREATITIS, HX OF 05/12/2007  . GASTROINTESTINAL HEMORRHAGE, HX OF 05/12/2007    Past Medical History:  Past Medical History  Diagnosis Date  . Glaucoma   . Diabetes mellitus without complication   . Hypertension   . Neuropathy   . Crohn disease   . Asthma    Past Surgical History:  Past Surgical History  Procedure Laterality Date  . Cesarean section      Assessment & Plan Clinical Impression: Patient is a 53 y.o. 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 She was admitted on 05/07/14 for work up and found to be hypotensive, tachycardic with elevated WBC and lactic acidosis. She was dehydrated and hypoxic and started on IV antibiotics for SIRS. BC X 2 and stools negative. CT head negative for acute changes. CT chest with acute bilateral PE and esophagus distended and fluid filled and lovenox added for treatment and patient wants eliquis for treatment. She was placed on liquid diet and Dr. Hilarie Fredrickson consulted for input. He recommended MRI pancreas as well as MRCP due to h/o chronic pancreatitis. Patient refused MRI study X 2 despite sedation and GI has signed off.  She has had improvement of GI symptoms and tolerated slow advancement of diet. SIRS resolved--unclear source. Syncope likely due to orthostatic hypotension and dehydration. Electrolyte abnormality treated and insulin discontinued due to hypoglycemic episodes--Hgb A1c-4.9. PT evaluation done yesterday and patient significantly deconditioned with tremors as well as ataxia with attempts at sitting at EOB. Patient has anorexia and significant amount of weight loss in the past few months due to d/c of nausea medication as well as multiple medication changes--d/c anxiolytic and addition of BP meds. Denies alcohol use since 2004, several urine alcohol toxicology performed over last several years have been negative. Urine toxicology has been positive for opiates in 2012 and in 2014 . Stays in the room most of the day and sponge bathes few days a week. Has an aide 4 hrs/5 days and 45 mins on the weekend to help with housework, Meal prep and ADLs. She could transfer independently and ambulate short distances to BR on good days.  Patient transferred to CIR on 05/14/2014 .    Patient currently requires min with basic self-care skills and basic mobility secondary to muscle weakness, decreased cardiorespiratoy endurance  and decreased standing balance and decreased balance strategies.  Prior to hospitalization, patient could complete ADL with mod I to mod A depending on level of fatigue .  Patient will benefit from skilled intervention to decrease level of assist with basic self-care skills and increase independence with basic self-care skills prior to discharge home with care partner.  Anticipate patient will require intermittent assist for IADLs and no further OT follow recommended.  OT - End of Session Activity Tolerance: Tolerates 30+ min activity with multiple rests OT Assessment Rehab Potential: Good OT Patient demonstrates impairments in the following area(s): Balance;Endurance;Motor OT Basic ADL's Functional Problem(s): Grooming;Bathing;Dressing;Toileting OT Transfers Functional Problem(s): Toilet;Tub/Shower OT Additional Impairment(s): None OT Plan OT Intensity: Minimum of 1-2 x/day, 45 to 90 minutes OT Frequency: 5 out of 7 days OT Duration/Estimated Length of Stay: 5 days OT Treatment/Interventions: Balance/vestibular training;Community reintegration;Discharge planning;Self Care/advanced ADL retraining;Therapeutic Activities;Functional mobility training;Patient/family education;DME/adaptive equipment instruction;UE/LE Strength taining/ROM OT Self Feeding Anticipated Outcome(s): no goal OT Basic Self-Care Anticipated Outcome(s): mod I  OT Toileting Anticipated Outcome(s): mod I  OT Bathroom Transfers Anticipated Outcome(s): mod I  OT Recommendation Patient destination: Home Follow Up Recommendations: None Equipment Recommended: None recommended by OT   Skilled Therapeutic Intervention Ot eval initated with OT goals, purpose and role discussed. Pt in bed eating breakfast when arrived. Pt excited to begin therapy day.  Focus on self care tasks including, bed mobility, dynamic sitting balance EOB, stand pivot transfer bed to [facility] with min A, toileting in standing with steady A, short distance  functional ambulation in the room without an AD with min A, grooming at sink level and bathed only periarea due to reporting bath earlier this am. Pt reports she is much better today and is able to complete task she has not been able to perform in a long while. REports sponge bathing at home and will continue this so no shower goal written. Pt ambulated with RW (for longer distance) from her room to the elevators without requiring rest break and with steady A.  Pt often with pauses to shift weight in standing demonstrating dynamic standing as she told stories. Pt left in recliner in room with NT.   OT Evaluation Precautions/Restrictions  Precautions Precautions: Fall Precaution Comments: history of back pain  Restrictions Weight Bearing Restrictions: No General Chart Reviewed: Yes Family/Caregiver Present: No    Pain Pain Assessment Pain Assessment: No/denies pain in session but reports history of pain and requires her meds.  Home Living/Prior Functioning Home Living Family/patient expects to be discharged to:: Private residence Living Arrangements: Children;Other (Comment) Available Help at Discharge: Other (Comment);Family (has an aide/ assisstant 3 hrs a day) Type of Home: Apartment Home Access: Stairs to enter [employer] of Steps: 2 flights of steps with landing. Entrance Stairs-Rails: Right;Left Home Layout: One level  Lives With: Son ADL ADL ADL Comments: see fim Vision/Perception  Vision- History Baseline Vision/History: Wears glasses Wears Glasses: Reading only Patient Visual Report: No change from baseline Vision- Assessment Vision Assessment?: No apparent visual deficits  Cognition Overall Cognitive Status: Within Functional Limits for tasks assessed Arousal/Alertness: Awake/alert Orientation Level: Oriented X4 Safety/Judgment: Appears intact Sensation Sensation Light Touch: Appears Intact Proprioception: Appears Intact Coordination Gross Motor  Movements are Fluid and Coordinated: Yes Fine Motor Movements are Fluid and Coordinated: Yes Motor  Motor Motor - Skilled  Clinical Observations: generalized weakness Mobility  Bed Mobility Bed Mobility: Supine to Sit;Sitting - Scoot to Edge of Bed Supine to Sit: 4: Min assist Sitting - Scoot to [employer] of Bed: 4: Min assist Transfers Transfers: Sit to Stand;Stand to Sit Sit to Stand: 4: Min assist Stand to Sit: 4: Min assist  Trunk/Postural Assessment  Cervical Assessment Cervical Assessment:  (forward flexion) Thoracic Assessment Thoracic Assessment:  (slightly kyphotic posture) Lumbar Assessment Lumbar Assessment:  (posterior pelvic tilt) Postural Control Postural Control: Within Functional Limits  Balance Balance Balance Assessed: Yes Static Sitting Balance Static Sitting - Balance Support: No upper extremity supported Static Sitting - Level of Assistance: 6: Modified independent (Device/Increase time) Dynamic Sitting Balance Dynamic Sitting - Balance Support: No upper extremity supported;During functional activity Dynamic Sitting - Level of Assistance: 5: Stand by assistance Dynamic Sitting - Balance Activities: Forward lean/weight shifting;Reaching for objects Sitting balance - Comments: supervision with donning socks and reaching for items on bedside table (outside of BOS) Static Standing Balance Static Standing - Balance Support: During functional activity Static Standing - Level of Assistance: 4: Min assist Dynamic Standing Balance Dynamic Standing - Level of Assistance: 4: Min assist Dynamic Standing - Balance Activities: Lateral lean/weight shifting;Forward lean/weight shifting;Reaching for weighted objects;Reaching across midline;Reaching for objects Extremity/Trunk Assessment RUE Assessment RUE Assessment: Within Functional Limits LUE Assessment LUE Assessment: Within Functional Limits  FIM:  FIM - Eating Eating Activity: 7: Complete independence:no  helper FIM - Grooming Grooming Steps: Wash, rinse, dry face;Wash, rinse, dry hands;Oral care, brush teeth, clean dentures Grooming: 5: Set-up assist to obtain items FIM - Bathing Bathing Steps Patient Completed: Front perineal area;Buttocks;Abdomen;Right upper leg;Left upper leg FIM - Upper Body Dressing/Undressing Upper body dressing/undressing: 0: Wears gown/pajamas-no public clothing FIM - Lower Body Dressing/Undressing Lower body dressing/undressing: 0: Wears gown/pajamas-no public clothing FIM - Toileting Toileting steps completed by patient: Adjust clothing prior to toileting;Performs perineal hygiene;Adjust clothing after toileting Toileting: 4: Steadying assist FIM - Bed/Chair Transfer Bed/Chair Transfer: 4: Supine > Sit: Min A (steadying Pt. > 75%/lift 1 leg);4: Bed > Chair or W/C: Min A (steadying Pt. > 75%) FIM - Radio producer Devices: Bedside commode Toilet Transfers: 4-From toilet/BSC: Min A (steadying Pt. > 75%);4-To toilet/BSC: Min A (steadying Pt. > 75%)   Refer to Care Plan for Long Term Goals  Recommendations for other services: Neuropsych  Discharge Criteria: Patient will be discharged from OT if patient refuses treatment 3 consecutive times without medical reason, if treatment goals not met, if there is a change in medical status, if patient makes no progress towards goals or if patient is discharged from hospital.  The above assessment, treatment plan, treatment alternatives and goals were discussed and mutually agreed upon: by patient  Nicoletta Ba 05/15/2014, 9:45 AM

## 2014-05-15 NOTE — Progress Notes (Signed)
Subjective: C/o pain. "I need my dilaudid back". Feeling OK.  Objective: Vital signs in last 24 hours: Temp:  [98.4 F (36.9 C)-99.4 F (37.4 C)] 98.4 F (36.9 C) (08/15 0541) Pulse Rate:  [96-118] 96 (08/15 0541) Resp:  [18-19] 18 (08/15 0541) BP: (94-136)/(62-95) 132/95 mmHg (08/15 0541) SpO2:  [96 %-100 %] 100 % (08/15 0541) Weight:  [109 lb 9.1 oz (49.7 kg)] 109 lb 9.1 oz (49.7 kg) (08/14 1620) Weight change:  Last BM Date: 05/14/14  Intake/Output from previous day: 08/14 0701 - 08/15 0700 In: 240 [P.O.:240] Out: 6 [Urine:3; Stool:3] Last cbgs: CBG (last 3)   Recent Labs  05/14/14 0755 05/14/14 0830 05/14/14 1144  GLUCAP 69* 103* 132*     Physical Exam General: No apparent distress   HEENT: not dry Lungs: Normal effort. Lungs clear to auscultation, no crackles or wheezes. Cardiovascular: Regular rate and rhythm, no edema Abdomen: S/NT/ND; BS(+) Musculoskeletal:  unchanged Neurological: No new neurological deficits Wounds: N/A    Skin: clear  Aging changes Mental state: Alert, oriented, cooperative    Lab Results: BMET    Component Value Date/Time   NA 147 05/14/2014 0521   K 3.6* 05/14/2014 0521   CL 119* 05/14/2014 0521   CO2 18* 05/14/2014 0521   GLUCOSE 75 05/14/2014 0521   BUN 4* 05/14/2014 0521   CREATININE 0.60 05/14/2014 0521   CALCIUM 6.8* 05/14/2014 0521   GFRNONAA >90 05/14/2014 0521   GFRAA >90 05/14/2014 0521   CBC    Component Value Date/Time   WBC 15.4* 05/14/2014 0521   RBC 2.85* 05/14/2014 0521   RBC 3.81* 07/30/2011 1045   HGB 8.8* 05/14/2014 0521   HCT 27.4* 05/14/2014 0521   PLT 298 05/14/2014 0521   MCV 96.1 05/14/2014 0521   MCH 30.9 05/14/2014 0521   MCHC 32.1 05/14/2014 0521   RDW 17.6* 05/14/2014 0521   LYMPHSABS 2.4 05/07/2014 2102   MONOABS 1.1* 05/07/2014 2102   EOSABS 0.0 05/07/2014 2102   BASOSABS 0.0 05/07/2014 2102    Studies/Results: No results found.  Medications: I have reviewed the patient's current  medications.  Assessment/Plan:  1. Functional deficits secondary to Deconditioning due to SIRS and chronic malnutrition.  2. Bilateral PE /Anticoagulation: Pharmaceutical: Other (comment)-Eliquis (Has trial card/On medicaid preferred List)  3. Chronic back pain/Neuropathy/Pain Management: Reduce dilaudid to 4mg  every 8 hours prn with hydrocodone prn 10/325 Q6h (her usual Med). Given history of alcohol is some concerns for medication overuse.  Given complaints of severe back pain, recent infection and last MRI greater than 5 years ago repeat lumbar MRI  4. Mood: Team to provide ego support. LCSW to follow for evaluation and support.  5. Neuropsych: This patient is capable of making decisions on Her own behalf.  6. Skin/Wound Care: Maintain adequate hydration and nutritional status to prevent breakdown. Continue ensure pudding.  7. Malnutrition: with low magnesium level, low phosphorous, low protein, low albumin. Corrected calcium--8.4. treated with IV mag today--recheck levels in am. Will add oral supplement magnesium and recheck on Monday.  8. Leucocytosis: Likely reactive. Did get short course steroids. Will continue to follow for signs of infection.  9. Chronic pancreatitis with abdominal pain: Appetite improving and feels current medication regimen effective in controlling symptoms.  10 DM type 2: Well controlled--Hgb A1c-4.6. Off medications due to hypoglycemia and poor intake. Will monitor with ac/hs checks and resume medication if indicated.  11. H/o depression/anxiety: continue Effexor bid as well as trazodone at bedtime for chronic insomnia. Change  xanax to klonopin. Recheck TSH Monday.      Length of stay, days: 1  Sonda Primes , MD 05/15/2014, 11:08 AM

## 2014-05-15 NOTE — Progress Notes (Signed)
Occupational Therapy Session Note  Patient Details  Name: [name] MRN: [id number] Date of Birth: [birth year]  Today's Date: 05/15/2014 Time: 3559-7416 Time Calculation (min): 60 min  Short Term Goals: Week 1:  OT Short Term Goal 1 (Week 1): STG=LTG due to short LOS  Skilled Therapeutic Interventions/Progress Updates:    1:1 Focus on functional ambulation from room to gym with one standing brake. Pt requested to use Nustep and was able to bike for 5 min with one rest break. Pt also requested to practice stairs again - pt able to climb stairs to the top and then back down the 6 inch steps with steady A.  Also then ambulated to ADL apartment to perform furniture transfers. Returned to room and performed bed mobility mod I.   Therapy Documentation Precautions:  Precautions Precautions: Fall Precaution Comments: history of back pain  Restrictions Weight Bearing Restrictions: No Pain:  no c/o pain in session  ADL: ADL ADL Comments: see fim  See FIM for current functional status  Therapy/Group: Individual Therapy  [name] [facility] 05/15/2014, 3:56 PM

## 2014-05-16 ENCOUNTER — Inpatient Hospital Stay (HOSPITAL_COMMUNITY): Payer: Medicare Other | Admitting: *Deleted

## 2014-05-16 DIAGNOSIS — K7460 Unspecified cirrhosis of liver: Secondary | ICD-10-CM

## 2014-05-16 DIAGNOSIS — E119 Type 2 diabetes mellitus without complications: Secondary | ICD-10-CM

## 2014-05-16 LAB — URINALYSIS, ROUTINE W REFLEX MICROSCOPIC
Bilirubin Urine: NEGATIVE
GLUCOSE, UA: NEGATIVE mg/dL
HGB URINE DIPSTICK: NEGATIVE
KETONES UR: NEGATIVE mg/dL
Leukocytes, UA: NEGATIVE
Nitrite: NEGATIVE
PROTEIN: NEGATIVE mg/dL
Specific Gravity, Urine: 1.012 (ref 1.005–1.030)
Urobilinogen, UA: 0.2 mg/dL (ref 0.0–1.0)
pH: 5.5 (ref 5.0–8.0)

## 2014-05-16 NOTE — Progress Notes (Signed)
Occupational Therapy Session Note  Patient Details  Name: [name] MRN: [id number] Date of Birth: Oct 07, 1960  Today's Date: 05/16/2014 Time:  -   1500-1600   (60 min)    Short Term Goals: Week 1:  OT Short Term Goal 1 (Week 1): STG=LTG due to short LOS  Skilled Therapeutic Interventions/Progress Updates:    Addressed functional transfers, bed mobility, standing balance, selective attention,, functional mobility, transfers to different surfaces.  Pt. Ambulated with RW to ADL apart with min assist.  Engaged in transfers to sofa, regular chair with arms, toilet, tub with shower seat.   Educated pt on proper body mechanics with sit to stand and hand placement.  Pt. Reported her bathtub is seated in a corner and has a half round circle for where the shower curtain goes.  Needs to be addressed further.  Pt very talkative and son present which distracted her as well.  Pt. Was minimal to SBA with all transfers plus cues for hand placement.    Therapy Documentation Precautions:  Precautions Precautions: Fall Precaution Comments: history of back pain  Restrictions Weight Bearing Restrictions: No   Pain:  none    ADL: ADL ADL Comments: see fim    See FIM for current functional status  Therapy/Group: Individual Therapy  Humberto Seals 05/16/2014, 5:55 PM

## 2014-05-16 NOTE — Progress Notes (Signed)
Subjective: C/o urinary incontinence at night; pain is better. Feeling OK.  Objective: Vital signs in last 24 hours: Temp:  [98.5 F (36.9 C)-98.8 F (37.1 C)] 98.7 F (37.1 C) (08/16 0518) Pulse Rate:  [100-105] 105 (08/15 2123) Resp:  [16-18] 18 (08/15 2123) BP: (97-102)/(68-76) 102/76 mmHg (08/15 2123) SpO2:  [99 %-100 %] 99 % (08/15 2123) Weight change:  Last BM Date: 05/15/14  Intake/Output from previous day: 08/15 0701 - 08/16 0700 In: 960 [P.O.:960] Out: -  Last cbgs: CBG (last 3)   Recent Labs  05/14/14 0755 05/14/14 0830 05/14/14 1144  GLUCAP 69* 103* 132*     Physical Exam General: No apparent distress   HEENT: not dry Lungs: Normal effort. Lungs clear to auscultation, no crackles or wheezes. Cardiovascular: Regular rate and rhythm, no edema Abdomen: S/NT/ND; BS(+) Musculoskeletal:  unchanged Neurological: No new neurological deficits Wounds: N/A    Skin: clear  Aging changes Mental state: Alert, oriented, cooperative    Lab Results: BMET    Component Value Date/Time   NA 147 05/14/2014 0521   K 3.6* 05/14/2014 0521   CL 119* 05/14/2014 0521   CO2 18* 05/14/2014 0521   GLUCOSE 75 05/14/2014 0521   BUN 4* 05/14/2014 0521   CREATININE 0.60 05/14/2014 0521   CALCIUM 6.8* 05/14/2014 0521   GFRNONAA >90 05/14/2014 0521   GFRAA >90 05/14/2014 0521   CBC    Component Value Date/Time   WBC 15.4* 05/14/2014 0521   RBC 2.85* 05/14/2014 0521   RBC 3.81* 07/30/2011 1045   HGB 8.8* 05/14/2014 0521   HCT 27.4* 05/14/2014 0521   PLT 298 05/14/2014 0521   MCV 96.1 05/14/2014 0521   MCH 30.9 05/14/2014 0521   MCHC 32.1 05/14/2014 0521   RDW 17.6* 05/14/2014 0521   LYMPHSABS 2.4 05/07/2014 2102   MONOABS 1.1* 05/07/2014 2102   EOSABS 0.0 05/07/2014 2102   BASOSABS 0.0 05/07/2014 2102    Studies/Results: No results found.  Medications: I have reviewed the patient's current medications.  Assessment/Plan:  1. Functional deficits secondary to Deconditioning due to  SIRS and chronic malnutrition.  2. Bilateral PE /Anticoagulation: Pharmaceutical: Other (comment)-Eliquis (Has trial card/On medicaid preferred List)  3. Chronic back pain/Neuropathy/Pain Management: Reduce dilaudid to 4mg  every 8 hours prn with hydrocodone prn 10/325 Q6h (her usual Med). Given history of alcohol is some concerns for medication overuse.  Given complaints of severe back pain, recent infection and last MRI greater than 5 years ago repeat lumbar MRI  4. Mood: Team to provide ego support. LCSW to follow for evaluation and support.  5. Neuropsych: This patient is capable of making decisions on Her own behalf.  6. Skin/Wound Care: Maintain adequate hydration and nutritional status to prevent breakdown. Continue ensure pudding.  7. Malnutrition: with low magnesium level, low phosphorous, low protein, low albumin. Corrected calcium--8.4. treated with IV mag today--recheck levels in am. Will add oral supplement magnesium and recheck on Monday.  8. Leucocytosis: Likely reactive. Did get short course steroids. Will continue to follow for signs of infection.  9. Chronic pancreatitis with abdominal pain: Appetite improving and feels current medication regimen effective in controlling symptoms.  10 DM type 2: Well controlled--Hgb A1c-4.6. Off medications due to hypoglycemia and poor intake. Will monitor with ac/hs checks and resume medication if indicated.  11. H/o depression/anxiety: continue Effexor bid as well as trazodone at bedtime for chronic insomnia. Change xanax to klonopin. Recheck TSH Monday.  12. Urinary incontinence at night. Check UA. Start Detrol  at hs if UA is normal     Length of stay, days: 2  Sonda PrimesAlex Sharis Keeran , MD 05/16/2014, 8:43 AM

## 2014-05-17 ENCOUNTER — Encounter (HOSPITAL_COMMUNITY): Payer: Medicare Other | Admitting: Occupational Therapy

## 2014-05-17 ENCOUNTER — Inpatient Hospital Stay (HOSPITAL_COMMUNITY): Payer: Medicare Other

## 2014-05-17 ENCOUNTER — Inpatient Hospital Stay (HOSPITAL_COMMUNITY): Payer: Medicare Other | Admitting: Occupational Therapy

## 2014-05-17 LAB — COMPREHENSIVE METABOLIC PANEL
ALT: 13 U/L (ref 0–35)
AST: 19 U/L (ref 0–37)
Albumin: 1.6 g/dL — ABNORMAL LOW (ref 3.5–5.2)
Alkaline Phosphatase: 98 U/L (ref 39–117)
Anion gap: 8 (ref 5–15)
BUN: 7 mg/dL (ref 6–23)
CALCIUM: 8.3 mg/dL — AB (ref 8.4–10.5)
CHLORIDE: 113 meq/L — AB (ref 96–112)
CO2: 25 meq/L (ref 19–32)
Creatinine, Ser: 0.62 mg/dL (ref 0.50–1.10)
GLUCOSE: 111 mg/dL — AB (ref 70–99)
Potassium: 5.7 mEq/L — ABNORMAL HIGH (ref 3.7–5.3)
SODIUM: 146 meq/L (ref 137–147)
Total Protein: 4.6 g/dL — ABNORMAL LOW (ref 6.0–8.3)

## 2014-05-17 LAB — CBC WITH DIFFERENTIAL/PLATELET
BASOS PCT: 1 % (ref 0–1)
Basophils Absolute: 0.1 10*3/uL (ref 0.0–0.1)
EOS ABS: 0.1 10*3/uL (ref 0.0–0.7)
Eosinophils Relative: 1 % (ref 0–5)
HEMATOCRIT: 25.8 % — AB (ref 36.0–46.0)
HEMOGLOBIN: 8.1 g/dL — AB (ref 12.0–15.0)
LYMPHS PCT: 30 % (ref 12–46)
Lymphs Abs: 3.8 10*3/uL (ref 0.7–4.0)
MCH: 30.1 pg (ref 26.0–34.0)
MCHC: 31.4 g/dL (ref 30.0–36.0)
MCV: 95.9 fL (ref 78.0–100.0)
Monocytes Absolute: 1.4 10*3/uL — ABNORMAL HIGH (ref 0.1–1.0)
Monocytes Relative: 11 % (ref 3–12)
NEUTROS ABS: 7.4 10*3/uL (ref 1.7–7.7)
Neutrophils Relative %: 57 % (ref 43–77)
Platelets: 344 10*3/uL (ref 150–400)
RBC: 2.69 MIL/uL — ABNORMAL LOW (ref 3.87–5.11)
RDW: 18.9 % — ABNORMAL HIGH (ref 11.5–15.5)
WBC: 12.8 10*3/uL — ABNORMAL HIGH (ref 4.0–10.5)

## 2014-05-17 LAB — OCCULT BLOOD X 1 CARD TO LAB, STOOL: Fecal Occult Bld: POSITIVE — AB

## 2014-05-17 LAB — TSH: TSH: 5.56 u[IU]/mL — AB (ref 0.350–4.500)

## 2014-05-17 LAB — MAGNESIUM: Magnesium: 1.2 mg/dL — ABNORMAL LOW (ref 1.5–2.5)

## 2014-05-17 MED ORDER — NYSTATIN 100000 UNIT/GM EX POWD
Freq: Three times a day (TID) | CUTANEOUS | Status: DC
  Administered 2014-05-17 – 2014-05-20 (×9): via TOPICAL
  Filled 2014-05-17 (×2): qty 15

## 2014-05-17 MED ORDER — FLUCONAZOLE 200 MG PO TABS
200.0000 mg | ORAL_TABLET | Freq: Once | ORAL | Status: AC
  Administered 2014-05-17: 200 mg via ORAL
  Filled 2014-05-17 (×2): qty 1

## 2014-05-17 MED ORDER — LEVOTHYROXINE SODIUM 25 MCG PO TABS
25.0000 ug | ORAL_TABLET | Freq: Every day | ORAL | Status: DC
  Administered 2014-05-18 – 2014-05-21 (×4): 25 ug via ORAL
  Filled 2014-05-17 (×6): qty 1

## 2014-05-17 MED ORDER — ALPRAZOLAM 0.5 MG PO TABS
1.0000 mg | ORAL_TABLET | Freq: Three times a day (TID) | ORAL | Status: DC | PRN
  Administered 2014-05-17 – 2014-05-21 (×6): 1 mg via ORAL
  Filled 2014-05-17 (×6): qty 2

## 2014-05-17 MED ORDER — HYDROCODONE-ACETAMINOPHEN 10-325 MG PO TABS
1.0000 | ORAL_TABLET | Freq: Three times a day (TID) | ORAL | Status: DC | PRN
  Administered 2014-05-17 – 2014-05-20 (×7): 1 via ORAL
  Filled 2014-05-17 (×7): qty 1

## 2014-05-17 NOTE — Progress Notes (Signed)
Assessment:  53yo female with (+)PE, continuing Apixaban. Hg 8.1 this AM, has been drifting down slightly over last several days but no bleeding noted. Pltc wnl.   Goal of Therapy:  Treatment of PE Monitor platelets by anticoagulation protocol: Yes   Plan:  1- Continue Apixaban 10mg  bid through 8/18, then decrease to 5mg  bid on 8/19  2- Monitor s/s of bleeding

## 2014-05-17 NOTE — Progress Notes (Signed)
Social Work Assessment and Plan Social Work Assessment and Plan  Patient Details  Name: [name] MRN: [id number] Date of Birth: [birth year]  Today's Date: 05/17/2014  Problem List:  Patient Active Problem List   Diagnosis Date Noted  . Physical deconditioning 05/14/2014  . SIRS (systemic inflammatory response syndrome) 05/08/2014  . Nicotine abuse 05/08/2014  . Encephalopathy 10/21/2012  . Hypernatremia 10/15/2012  . Hypokalemia 10/15/2012  . Hypomagnesemia 10/15/2012  . Hypophosphatemia 10/15/2012  . Leukocytosis 10/15/2012  . Normocytic anemia 10/15/2012  . Thrombocytopenia 10/15/2012  . Elevated INR 10/15/2012  . Respiratory failure 10/15/2012  . Metabolic acidosis 22/11/5425  . Acute respiratory failure 10/13/2012  . ARDS (adult respiratory distress syndrome) 10/13/2012  . Crohn's disease 10/13/2012  . Bilateral lower abdominal discomfort 10/12/2012  . H1N1 influenza 10/12/2012  . Sepsis 10/11/2012  . Community acquired pneumonia 10/11/2012  . DIABETES MELLITUS, TYPE II 05/12/2007  . ANXIETY 05/12/2007  . DEPRESSION 05/12/2007  . PERIPHERAL NEUROPATHY 05/12/2007  . HYPERTENSION 05/12/2007  . ALLERGIC RHINITIS 05/12/2007  . ASTHMA 05/12/2007  . CIRRHOSIS 05/12/2007  . PANCREATITIS, HX OF 05/12/2007  . GASTROINTESTINAL HEMORRHAGE, HX OF 05/12/2007   Past Medical History:  Past Medical History  Diagnosis Date  . Glaucoma   . Diabetes mellitus without complication   . Hypertension   . Neuropathy   . Crohn disease   . Asthma    Past Surgical History:  Past Surgical History  Procedure Laterality Date  . Cesarean section     Social History:  reports that she has been smoking.  She does not have any smokeless tobacco history on file. She reports that she drinks alcohol. She reports that she does not use illicit drugs.  Family / Support Systems Marital Status: Single Patient Roles: Parent Children: [name]-son  [phone] Other Supports: Frank-nephew   [phone] Anticipated Caregiver: Son and Physiological scientist Ability/Limitations of Caregiver: Son is not there all of the time and PCS worker 3hours-5 days a week Caregiver Availability: Other (Comment) (Son is in and out and PCS few hours per day) Family Dynamics: Very close with son, he has taken car eof her when needed.  She feels very fourtuante to have him.  She is not close with her siblings and doesn't see them much.  She relies upon her son and PCS worker to get her needs met, but wants to be able to do for herself.  Social History Preferred language: English Religion: Holiness Cultural Background: No issues Education: Surveyor, [employer] college Read: Yes Write: Yes Employment Status: Disabled Date Retired/Disabled/Unemployed: 2004 back issues Freight forwarder Issues: No issues Guardian/Conservator: None-according to MD pt is capable of making her own decisions while here.   Abuse/Neglect Physical Abuse: Denies Verbal Abuse: Denies Sexual Abuse: Denies Exploitation of patient/patient's resources: Denies Self-Neglect: Denies  Emotional Status Pt's affect, behavior adn adjustment status: Pt is motivated to regain her strength and try to become independent before she leaves here.  She does not want to burden her son and wants him to finish high school.  She feels once her medicines get right she can recover and regain her strength and some weight. Recent Psychosocial Issues: Other medical issues-needs someone to look at the whole picture not just a piece of it. Pyschiatric History: History of depression/anxiety takes meds for and feels they help her.  She has seen someone in the past but is not now, she feels this is not necessary.  She feels if can get a good medical doctor then this  will help her more than anything.  She does talk a great deal and exhibits some memory issues. Substance Abuse History: Tobacco may quit, has quit ETOH.  Awar eof resources available to her  regarding quitting smoking.  Patient / Family Perceptions, Expectations & Goals Pt/Family understanding of illness & functional limitations: Pt is able to explain her medical issues, she is trying to get her medicines back on track so she can eat and regain some of the weight she has lost along with the muscle, so she can become independent again.  She is going to try her best.  She feels her questions and concerns are begin addressed here. Premorbid pt/family roles/activities: Mother, Sister, Elenor Legato, retiree, etc Anticipated changes in roles/activities/participation: resume Pt/family expectations/goals: Pt states: " I want to take care of myself and be able to move around and not be so tired and hurting all of the time."  Her nausea has been much better since coming into the hospital.  [employer]: Other (Comment) (PCS worker-on CAP waiting list) Premorbid Home Care/DME Agencies: Other (Comment) (Had in past) Transportation available at discharge: Physiological scientist or Medicaid transport Resource referrals recommended: Support group (specify)  Discharge Planning Living Arrangements: Children Support Systems: Children;Home care staff Type of Residence: Private residence Insurance Resources: Medicare;Medicaid (specify county) ([place]) Museum/gallery curator Resources: SSD Financial Screen Referred: Previously completed Living Expenses: Education officer, community Management: Patient Does the patient have any problems obtaining your medications?: No Home Management: Physiological scientist does home Quarry manager Plans: Return home with [employer] worker and son who is in and out.  If she needs something she can trust him to get it and be there.  She feels if she can get stronger than she will be able to do more for herself, but she has always been one to do what she needs to do, just figured out a way. Social Work Anticipated Follow Up Needs: HH/OP;Other (comment)  Clinical  Impression Pleasant talkative female who is willing to work hard in therapy to regain her independence and strength.  Will try to assist in obtaining a new PCP since she has lost confidence in her current one. She is aware will need to change her medicaid card, but reports has done in the past to knows how.  Will work with pt on resources eligible for and any other needs.  She is aware it is based upon her Eating and participating in therapies.  Will be short length of stay due to high level.  Elease Hashimoto 05/17/2014, 2:57 PM

## 2014-05-17 NOTE — Progress Notes (Signed)
Patient information reviewed and entered into [facility] System by Becky Cannon Quinton, covering PPS coordinator. Information including medical coding and functional independence measure will be reviewed and updated through discharge.  Per nursing, patient was given "Data Collection Information Summary for Patients in Inpatient Rehabilitation Facilities with attached Privacy Act Statement Health Care Records" upon admission.     

## 2014-05-17 NOTE — Progress Notes (Addendum)
53 y.o. 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 She was admitted on 05/07/14 for work up and found to be hypotensive, tachycardic with elevated WBC and lactic acidosis. She was dehydrated and hypoxic and started on IV antibiotics for SIRS. BC X 2 and stools negative. CT head negative for acute changes. CT chest with acute bilateral PE and esophagus distended and fluid filled and lovenox added for treatment and patient wants eliquis for treatment. She was placed on liquid diet and Dr. Hilarie Fredrickson consulted for input. He recommended MRI pancreas as well as MRCP due to h/o chronic pancreatitis. Patient refused MRI study X 2 despite sedation and GI has signed off.  She has had improvement of GI symptoms and tolerated slow advancement of diet. SIRS resolved--unclear source.   Subjective/Complaints: C/o bilateral leg tenderness, back pain Treated by Dr Lynann Bologna for back pain, chronic narcotics, last MRI 2009 Received occ dilaudidi 30 tabs for breakthrough pain/flare ups  Review of Systems - Negative except left hip pain Objective: Vital Signs: Blood pressure 128/90, pulse 102, temperature 98.6 F (37 C), temperature source Oral, resp. rate 20, height 5' 5"  (1.651 m), weight 49.7 kg (109 lb 9.1 oz), SpO2 99.00%. No results found. Results for orders placed during the hospital encounter of 05/14/14 (from the past 72 hour(s))  MAGNESIUM     Status: Abnormal   Collection Time    05/15/14  4:52 AM      Result Value Ref Range   Magnesium 1.4 (*) 1.5 - 2.5 mg/dL  URINALYSIS, ROUTINE W REFLEX MICROSCOPIC     Status: None   Collection Time    05/16/14 12:19 PM      Result Value Ref Range   Color, Urine YELLOW  YELLOW   APPearance CLEAR  CLEAR   Specific Gravity, Urine 1.012  1.005 - 1.030   pH 5.5  5.0 - 8.0   Glucose, UA NEGATIVE  NEGATIVE mg/dL   Hgb urine dipstick NEGATIVE  NEGATIVE   Bilirubin Urine NEGATIVE  NEGATIVE   Ketones, ur NEGATIVE  NEGATIVE mg/dL   Protein, ur NEGATIVE  NEGATIVE mg/dL   Urobilinogen, UA 0.2  0.0 - 1.0 mg/dL   Nitrite NEGATIVE  NEGATIVE   Leukocytes, UA NEGATIVE  NEGATIVE   Comment: MICROSCOPIC NOT DONE ON URINES WITH NEGATIVE PROTEIN, BLOOD, LEUKOCYTES, NITRITE, OR GLUCOSE <1000 mg/dL.  TSH     Status: Abnormal   Collection Time    05/17/14  5:00 AM      Result Value Ref Range   TSH 5.560 (*) 0.350 - 4.500 uIU/mL  CBC WITH DIFFERENTIAL     Status: Abnormal   Collection Time    05/17/14  5:00 AM      Result Value Ref Range   WBC 12.8 (*) 4.0 - 10.5 K/uL   Comment: ADJUSTED FOR NUCLEATED RBC'S   RBC 2.69 (*) 3.87 - 5.11 MIL/uL   Hemoglobin 8.1 (*) 12.0 - 15.0 g/dL   HCT 25.8 (*) 36.0 - 46.0 %   MCV 95.9  78.0 - 100.0 fL   MCH 30.1  26.0 - 34.0 pg   MCHC 31.4  30.0 - 36.0 g/dL   RDW 18.9 (*) 11.5 - 15.5 %   Platelets 344  150 - 400 K/uL   Neutrophils Relative % 57  43 - 77 %   Lymphocytes Relative  30  12 - 46 %   Monocytes Relative 11  3 - 12 %   Eosinophils Relative 1  0 - 5 %   Basophils Relative 1  0 - 1 %   Neutro Abs 7.4  1.7 - 7.7 K/uL   Lymphs Abs 3.8  0.7 - 4.0 K/uL   Monocytes Absolute 1.4 (*) 0.1 - 1.0 K/uL   Eosinophils Absolute 0.1  0.0 - 0.7 K/uL   Basophils Absolute 0.1  0.0 - 0.1 K/uL   RBC Morphology POLYCHROMASIA PRESENT     Comment: RARE NRBCs   WBC Morphology MILD LEFT SHIFT (1-5% METAS, OCC MYELO, OCC BANDS)    COMPREHENSIVE METABOLIC PANEL     Status: Abnormal   Collection Time    05/17/14  5:00 AM      Result Value Ref Range   Sodium 146  137 - 147 mEq/L   Potassium 5.7 (*) 3.7 - 5.3 mEq/L   Chloride 113 (*) 96 - 112 mEq/L   CO2 25  19 - 32 mEq/L   Glucose, Bld 111 (*) 70 - 99 mg/dL   BUN 7  6 - 23 mg/dL   Creatinine, Ser 0.62  0.50 - 1.10 mg/dL   Calcium 8.3 (*) 8.4 - 10.5 mg/dL   Total Protein 4.6 (*) 6.0 - 8.3 g/dL    Albumin 1.6 (*) 3.5 - 5.2 g/dL   AST 19  0 - 37 U/L   ALT 13  0 - 35 U/L   Alkaline Phosphatase 98  39 - 117 U/L   Total Bilirubin <0.2 (*) 0.3 - 1.2 mg/dL   GFR calc non Af Amer >90  >90 mL/min   GFR calc Af Amer >90  >90 mL/min   Comment: (NOTE)     The eGFR has been calculated using the CKD EPI equation.     This calculation has not been validated in all clinical situations.     eGFR's persistently <90 mL/min signify possible Chronic Kidney     Disease.   Anion gap 8  5 - 15  MAGNESIUM     Status: Abnormal   Collection Time    05/17/14  5:00 AM      Result Value Ref Range   Magnesium 1.2 (*) 1.5 - 2.5 mg/dL     HEENT: normal Cardio: RRR Resp: CTA B/L and unlabored GI: BS positive and NT,ND Extremity:  Pulses positive and 2+ pedal edema Skin:   Intact and Wound Left lat thigh incision CDI no erythema Neuro: Alert/Oriented and Abnormal Motor 3- grip left hand otherwise 5/5 in UEs, RLE 5/5, LLE 3-/5 KE ankle , 2- HF Musc/Skel:  Other Left wrist splint Gen NAD   Assessment/Plan: 1. Functional deficits secondary to Deconditioning due to SIRS which require 3+ hours per day of interdisciplinary therapy in a comprehensive inpatient rehab setting. Physiatrist is providing close team supervision and 24 hour management of active medical problems listed below. Physiatrist and rehab team continue to assess barriers to discharge/monitor patient progress toward functional and medical goals. FIM: FIM - Bathing Bathing Steps Patient Completed: Front perineal area;Buttocks;Abdomen;Right upper leg;Left upper leg  FIM - Upper Body Dressing/Undressing Upper body dressing/undressing: 0: Wears gown/pajamas-no public clothing FIM - Lower Body Dressing/Undressing Lower body dressing/undressing: 0: Wears gown/pajamas-no public clothing  FIM - Toileting Toileting steps completed by patient: Adjust clothing prior to toileting;Performs perineal hygiene;Adjust clothing after toileting Toileting  Assistive Devices: Grab bar or rail for support Toileting: 5: Set-up assist to: Obtain supplies  FIM - Radio producer Devices: Elevated toilet seat;Grab bars;Walker Toilet Transfers: 4-To toilet/BSC: Min A (steadying Pt. > 75%);4-From toilet/BSC: Min A (steadying Pt. > 75%)  FIM - Bed/Chair Transfer Bed/Chair Transfer: 5: Sit > Supine: Supervision (verbal cues/safety issues);4: Chair or W/C > Bed: Min A (steadying Pt. > 75%);4: Bed > Chair or W/C: Min A (steadying Pt. > 75%);4: Supine > Sit: Min A (steadying Pt. > 75%/lift 1 leg)  FIM - Locomotion: Wheelchair Distance: 125 Locomotion: Wheelchair: 2: Travels 50 - 149 ft with supervision, cueing or coaxing FIM - Locomotion: Ambulation Locomotion: Ambulation Assistive Devices: Other (comment) (hand hold assist) Ambulation/Gait Assistance: 4: Min assist Locomotion: Ambulation: 1: Travels less than 50 ft with minimal assistance (Pt.>75%)  Comprehension Comprehension Mode: Auditory Comprehension: 6-Follows complex conversation/direction: With extra time/assistive device  Expression Expression Mode: Verbal Expression: 6-Expresses complex ideas: With extra time/assistive device  Social Interaction Social Interaction: 6-Interacts appropriately with others with medication or extra time (anti-anxiety, antidepressant).  Problem Solving Problem Solving: 7-Solves complex problems: Recognizes & self-corrects  Memory Memory: 7-Complete Independence: No helper  Medical Problem List and Plan:  1. Functional deficits secondary to Deconditioning due to SIRS and chronic malnutrition.  2. Bilateral PE /Anticoagulation: Pharmaceutical: Other (comment)-Eliquis (Has trial card/On medicaid preferred List)  3. Chronic back pain/Neuropathy/Pain Management: Reduce dilaudid to 59m every 8 hours prn with hydrocodone prn 10/325 Q6h (her usual Med). Given history of alcohol is some concerns for medication overuse.  Given complaints  of severe back pain, recent infection and last MRI greater than 5 years ago repeat lumbar MRI  4. Mood: Team to provide ego support. LCSW to follow for evaluation and support.  5. Neuropsych: This patient is capable of making decisions on Her own behalf.  6. Skin/Wound Care: Maintain adequate hydration and nutritional status to prevent breakdown. Continue ensure pudding.  7. Malnutrition: with low magnesium level, low phosphorous, low protein, low albumin. Corrected calcium--8.4. treated with IV mag today--recheck levels in am. Will add oral supplement magnesium and recheck on Monday.  8. Leucocytosis: Likely reactive. Did get short course steroids. Will continue to follow for signs of infection.  9. Chronic pancreatitis with abdominal pain: Appetite improving and feels current medication regimen effective in controlling symptoms.  10 DM type 2: Well controlled--Hgb A1c-4.6. Off medications due to hypoglycemia and poor intake. Will monitor with ac/hs checks and resume medication if indicated.  11. H/o depression/anxiety: continue Effexor bid as well as trazodone at bedtime for chronic insomnia. Change xanax to klonopin. Recheck TSH elevated  12.  Anemia- ? GI blood loss check guaic 13.  Hyper K will d/c kcl supplements and recheck 14.  Mild elevation TSH, diffuse body aches, edema, will add low dose synthroid LOS (Days) 3 A FACE TO FACE EVALUATION WAS PERFORMED  KIRSTEINS,ANDREW E 05/17/2014, 8:25 AM

## 2014-05-17 NOTE — IPOC Note (Signed)
Overall Plan of Care [facility]) Patient Details Name: [name] MRN: [id number] DOB: [birth year]  Admitting Diagnosis: Deconditioned   Hospital Problems: Active Problems:   Physical deconditioning     Functional Problem List: Nursing Bladder;Bowel;Medication Management;Pain;Safety;Skin Integrity  PT Balance;Endurance  OT Balance;Endurance;Motor  SLP    TR         Basic ADL's: OT Grooming;Bathing;Dressing;Toileting     Advanced  ADL's: OT       Transfers: PT Bed Mobility;Bed to Chair;Car  OT Toilet;Tub/Shower     Locomotion: PT Ambulation;Stairs     Additional Impairments: OT None  SLP        TR      Anticipated Outcomes Item Anticipated Outcome  Self Feeding no goal  Swallowing      Basic self-care  mod I   Toileting  mod I    Bathroom Transfers mod I   Bowel/Bladder  cont of bowel and bladder  Transfers  mod I for transfers  Locomotion  mod I for ambulation, S for stairs  Communication     Cognition     Pain  less than 2  Safety/Judgment  adhere to safety protocol in place   Therapy Plan: PT Intensity: Minimum of 1-2 x/day ,45 to 90 minutes PT Frequency: 5 out of 7 days PT Duration Estimated Length of Stay: 5 days OT Intensity: Minimum of 1-2 x/day, 45 to 90 minutes OT Frequency: 5 out of 7 days OT Duration/Estimated Length of Stay: 5 days         Team Interventions: Nursing Interventions Patient/Family Education;Bladder Management;Bowel Management;Disease Management/Prevention;Pain Management;Discharge Planning;Psychosocial Support  PT interventions Ambulation/gait training;Balance/vestibular training;Discharge planning;DME/adaptive equipment instruction;Functional mobility training;Neuromuscular re-education;Therapeutic Exercise;Therapeutic Activities;Stair training;UE/LE Strength taining/ROM;UE/LE Coordination activities;Wheelchair propulsion/positioning  OT Interventions Balance/vestibular training;Community reintegration;Discharge  planning;Self Care/advanced ADL retraining;Therapeutic Activities;Functional mobility training;Patient/family education;DME/adaptive equipment instruction;UE/LE Strength taining/ROM  SLP Interventions    TR Interventions    SW/CM Interventions      Team Discharge Planning: Destination: PT-Home ,OT- Home , SLP-  Projected Follow-up: PT-Home health PT, OT-  None, SLP-  Projected Equipment Needs: PT-To be determined, OT- None recommended by OT, SLP-  Equipment Details: PT- , OT-  Patient/family involved in discharge planning: PT- Patient,  OT-Patient, SLP-   MD ELOS: 7-10days    Medical Rehab Prognosis:  Good Assessment: 53 y.o. 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 She was admitted on 05/07/14 for work up and found to be hypotensive, tachycardic with elevated WBC and lactic acidosis. She was dehydrated and hypoxic and started on IV antibiotics for SIRS. BC X 2 and stools negative. CT head negative for acute changes. CT chest with acute bilateral PE and esophagus distended and fluid filled and lovenox added for treatment and patient wants eliquis for treatment. She was placed on liquid diet and Dr. Rhea Belton consulted for input. He recommended MRI pancreas as well as MRCP due to h/o chronic pancreatitis. Patient refused MRI study X 2 despite sedation and GI has signed off.    Now requiring 24/7 Rehab RN,MD, as well as CIR level PT, OT   Treatment team will focus on ADLs and mobility with goals set at Mod I   See Team Conference Notes for weekly updates to the plan of care

## 2014-05-17 NOTE — Care Management Note (Signed)
Inpatient Rehabilitation Center Individual Statement of Services  Patient Name:  [name]  Date:  05/17/2014  Welcome to the Inpatient Rehabilitation Center.  Our goal is to provide you with an individualized program based on your diagnosis and situation, designed to meet your specific needs.  With this comprehensive rehabilitation program, you will be expected to participate in at least 3 hours of rehabilitation therapies Monday-Friday, with modified therapy programming on the weekends.  Your rehabilitation program will include the following services:  Physical Therapy (PT), Occupational Therapy (OT), 24 hour per day rehabilitation nursing, Therapeutic Recreaction (TR), Neuropsychology, Case Management (Social Worker), Rehabilitation Medicine, Nutrition Services and Pharmacy Services  Weekly team conferences will be held on Wednesday to discuss your progress.  Your Social Worker will talk with you frequently to get your input and to update you on team discussions.  Team conferences with you and your family in attendance may also be held.  Expected length of stay: 5 days  Overall anticipated outcome: mod/i level  Depending on your progress and recovery, your program may change. Your Social Worker will coordinate services and will keep you informed of any changes. Your Social Worker's name and contact numbers are listed  below.  The following services may also be recommended but are not provided by the Inpatient Rehabilitation Center:   Driving Evaluations  Home Health Rehabiltiation Services  Outpatient Rehabilitation Services    Arrangements will be made to provide these services after discharge if needed.  Arrangements include referral to agencies that provide these services.  Your insurance has been verified to be:  Medicare & Medicaid Your primary doctor is:  [facility]  Pertinent information will be shared with your doctor and your insurance company.  Social Worker:  Dossie Der, SW [phone] or ([phone]  Information discussed with and copy given to patient by: Lucy Chris, 05/17/2014, 1:14 PM

## 2014-05-17 NOTE — Progress Notes (Signed)
Radiology called to ask if patient will be able to take MRI this AM.  Patient refused and states, "I am claustrophobic and I am not getting in that machine."

## 2014-05-17 NOTE — Progress Notes (Signed)
Occupational Therapy Session Note  Patient Details  Name: [name] MRN: [id number] Date of Birth: 10/18/1960  Today's Date: 05/17/2014 OT Individual Time: 2409-7353 and 1415-1445 OT Individual Time Calculation (min): 55 min and 30 min   Short Term Goals: Week 1:  OT Short Term Goal 1 (Week 1): STG=LTG due to short LOS  Skilled Therapeutic Interventions/Progress Updates:    1) Engaged in ADL retraining with focus on dynamic sitting and standing balance, functional mobility with RW, and activity tolerance.  Pt completed dressing task seated EOB with crossing BLE over opposite leg when threading pants and donning socks.  Pt required assist with donning Lt sock due to fatigue.  Ambulated >200 feet with RW and min guard with focus on safe distance from RW.  Ambulated in hospital gift shop with focus on managing walker in tight spaces, with min cues for safety with RW.  Upon return to room, pt reports need to toilet.  Completed toilet transfer and toileting with supervision with use of RW.    2) Engaged in functional mobility and activity tolerance with focus on dynamic balance.  Pt easily distracted when ambulating in hallway as reaching out to grab items from RN station and veering to speak to someone, verbal cues for attention to task to increase safety with mobility.  Engaged in balance activity with retrieving horse shoes from floor with focus on balance reactions and safety while managing RW.  Pt with c/o pain in Rt calf, premedicated.  Therapy Documentation Precautions:  Precautions Precautions: Fall Precaution Comments: history of back pain  Restrictions Weight Bearing Restrictions: No Pain: Pain Assessment Pain Score: 4  Pain Type: Chronic pain Pain Location: Back Pain Descriptors / Indicators: Aching Pain Intervention(s): Medication (See eMAR) ADL: ADL ADL Comments: see fim  See FIM for current functional status  Therapy/Group: Individual Therapy  Rosalio Loud 05/17/2014, 12:24 PM

## 2014-05-17 NOTE — Progress Notes (Signed)
Physical Therapy Session Note  Patient Details  Name: [name] MRN: [id number] Date of Birth: 03/22/1961  Today's Date: 05/17/2014 PT Individual Time: 1115-1220 PT Individual Time Calculation (min): 65 min   Short Term Goals: Week 1:  PT Short Term Goal 1 (Week 1): STGs = LTGs  Skilled Therapeutic Interventions/Progress Updates:  C/o R posterior knee pain while ambulating; unrated.  No redness, but somewhat tender; calf not tender.  Conveyed to RN and Zerita Boersaroline King, PT who will see pt later today.  Pt donned socks sitting bedside.  Toilet transfer - see FIM.  Pt has irritated skin proximal inner bil thighs; slight fresh blood on panties.  Called RN who observed skin.  W/c propulsion x 150' with supervision.    In dayroom on carpet, gait with RW x 50' to counter to tend to house plants.  Pt checked plants and used small watering can to water PRN, stepping sideways R and L with RW as needed, with min guard assist. Pt balanced while leaning forward, L and R during plant activity, without LOB. Total time standing and ambulating 25 minutes without sitting.  Gait x 150' on carpet and level tile to return to room, min guard/supervision.    Pt returned to bed for lunch.  Pt unsafe with use of Rw when approaching bed, leaving it in place as she stepped sideways with each foot to get around it.  Bed alarm set and all needs in place.    Therapy Documentation Precautions:  Precautions Precautions: Fall Precaution Comments: history of back pain  Restrictions Weight Bearing Restrictions: No   Pain: Pain Assessment Pain Score: 4  Pain Type: Chronic pain Pain Location: Back Pain Descriptors / Indicators: Aching Pain Intervention(s): Medication (See eMAR)   Locomotion : Ambulation Ambulation/Gait Assistance: 4: Min assist    See FIM for current functional status  Therapy/Group: Individual Therapy  [name] 05/17/2014, 12:46 PM

## 2014-05-17 NOTE — Progress Notes (Signed)
Physical Therapy Session Note  Patient Details  Name: [name] MRN: [id number] Date of Birth: 07/02/1961  Today's Date: 05/17/2014 PT Individual Time: 1500-1530 PT Individual Time Calculation (min): 30 min   Short Term Goals: Week 1:  PT Short Term Goal 1 (Week 1): STGs = LTGs  Skilled Therapeutic Interventions/Progress Updates:  1:1. Pt received semi-reclined in bed, ready for therapy. Focus this session on activity tolerance and therex. Pt mod(I) for t/f sup<>sit EOB w/ HOB elevated. Pt amb 180'x2 w/ RW and supervision, cued for increased pace to achieve more fluid reciprocal pattern. Pt utilized NuStep for B UE/LE strength, endurance and edema management, level 2-3x688min w/ fair tolerance. Pt reporting 10/10 pain in R knee at end of session, RN made aware. Pt left semi-reclined in bed at end of session w/ all needs in reach, pharmacist and friend in room.   Therapy Documentation Precautions:  Precautions Precautions: Fall Precaution Comments: history of back pain  Restrictions Weight Bearing Restrictions: No Pain: Pain Assessment Pain Assessment: 0-10 Pain Score: 10-Worst pain ever Pain Type: Acute pain Pain Location: Knee Pain Orientation: Posterior;Right Pain Descriptors / Indicators: Aching Pain Intervention(s): Distraction;RN made aware (Pt reported receiving pain medicine prior to start of session)  See FIM for current functional status  Therapy/Group: Individual Therapy  Denzil HughesKing, Shalynn Jorstad S 05/17/2014, 3:47 PM

## 2014-05-18 ENCOUNTER — Inpatient Hospital Stay (HOSPITAL_COMMUNITY): Payer: Medicare Other | Admitting: Physical Therapy

## 2014-05-18 ENCOUNTER — Inpatient Hospital Stay (HOSPITAL_COMMUNITY): Payer: Medicare Other | Admitting: Occupational Therapy

## 2014-05-18 ENCOUNTER — Encounter (HOSPITAL_COMMUNITY): Payer: Medicare Other | Admitting: Occupational Therapy

## 2014-05-18 ENCOUNTER — Inpatient Hospital Stay (HOSPITAL_COMMUNITY): Payer: Medicare Other

## 2014-05-18 DIAGNOSIS — R5381 Other malaise: Secondary | ICD-10-CM

## 2014-05-18 DIAGNOSIS — Z5189 Encounter for other specified aftercare: Secondary | ICD-10-CM

## 2014-05-18 DIAGNOSIS — E119 Type 2 diabetes mellitus without complications: Secondary | ICD-10-CM

## 2014-05-18 LAB — BASIC METABOLIC PANEL
Anion gap: 8 (ref 5–15)
Anion gap: 6 (ref 5–15)
BUN: 11 mg/dL (ref 6–23)
BUN: 11 mg/dL (ref 6–23)
CALCIUM: 9 mg/dL (ref 8.4–10.5)
CALCIUM: 9 mg/dL (ref 8.4–10.5)
CO2: 26 mEq/L (ref 19–32)
CO2: 28 meq/L (ref 19–32)
Chloride: 103 mEq/L (ref 96–112)
Chloride: 102 mEq/L (ref 96–112)
Creatinine, Ser: 0.58 mg/dL (ref 0.50–1.10)
Creatinine, Ser: 0.62 mg/dL (ref 0.50–1.10)
GFR calc Af Amer: >90 mL/min (ref >90)
GFR calc non Af Amer: >90 mL/min (ref >90)
GFR calc non Af Amer: >90 mL/min (ref >90)
GLUCOSE: 87 mg/dL (ref 70–99)
Glucose, Bld: 76 mg/dL (ref 70–99)
Potassium: 7.1 mEq/L (ref 3.7–5.3)
Potassium: 7.2 mEq/L (ref 3.7–5.3)
Sodium: 137 mEq/L (ref 137–147)
Sodium: 136 mEq/L — ABNORMAL LOW (ref 137–147)

## 2014-05-18 LAB — HEMOGLOBIN AND HEMATOCRIT, BLOOD
HCT: 26.3 % — ABNORMAL LOW (ref 36.0–46.0)
Hemoglobin: 8.4 g/dL — ABNORMAL LOW (ref 12.0–15.0)

## 2014-05-18 LAB — GLUCOSE, CAPILLARY: Glucose-Capillary: 87 mg/dL (ref 70–99)

## 2014-05-18 LAB — URIC ACID: Uric Acid, Serum: 3.8 mg/dL (ref 2.4–7.0)

## 2014-05-18 MED ORDER — SODIUM POLYSTYRENE SULFONATE 15 GM/60ML PO SUSP
30.0000 g | ORAL | Status: AC
  Administered 2014-05-18: 30 g via ORAL
  Filled 2014-05-18: qty 120

## 2014-05-18 MED ORDER — HYDROMORPHONE HCL 2 MG PO TABS
4.0000 mg | ORAL_TABLET | Freq: Every day | ORAL | Status: DC | PRN
  Administered 2014-05-18 (×2): 4 mg via ORAL
  Filled 2014-05-18 (×2): qty 2

## 2014-05-18 NOTE — Progress Notes (Signed)
CRITICAL VALUE ALERT  Critical value received: Potassium 7.2  Date of notification:  06/18/2014  Time of notification: 10:05  Critical value read back:Yes.    Nurse who received alert: Antonietta Breach  MD notified (1st page): 10:06  Time of first page:  10:06  MD notified (2nd page):  Time of second page:  Responding MD: Delle Reining, PA  Time MD responded:  10;10

## 2014-05-18 NOTE — Progress Notes (Signed)
Patient Critical lab value Potassium 7.1. PA on call notified. Ordered Kayexelate PO 30 g stat. And BMET stat.

## 2014-05-18 NOTE — Progress Notes (Signed)
Occupational Therapy Session Note  Patient Details  Name: [name] MRN: [id number] Date of Birth: 08/10/1961  Today's Date: 05/18/2014 OT Individual Time: [id number] and 1305-1405 OT Individual Time Calculation (min): 60 min and 60 min   Short Term Goals: Week 1:  OT Short Term Goal 1 (Week 1): STG=LTG due to short LOS  Skilled Therapeutic Interventions/Progress Updates:    1) Engaged in ADL retraining with focus on increased independence with self-care tasks and safety with mobility.  Pt completed bathing at sit > stand level in shower with supervision and use of tub bench for safety.  During toilet transfer pt left RW placed next to toilet and walked around it to sit on toilet.  Educated on proper use of RW and keeping it in front during all mobility.  Pt with improved RW safety throughout rest of session.  Assist provided with thigh high TEDS due to pt c/o pain in Rt leg, RN aware and premedicated.  2) Engaged in functional mobility and safety awareness in ADL apartment to simulate home environment.  Ambulated with RW into bathroom where pt completed tub/shower transfer with tub transfer bench without verbal cues for sequencing and safety, however requiring verbal cues for safety and proper hand placement with sit <> stand from toilet with RW.  Engaged in sidestepping through bathroom door as pt reports RW will not fit into bathroom, educated on recommendation for RW with all ambulation therefore may need to sidestep to access bathroom.  In kitchen gathered items to simulate meal prep with pt utilizing long handled spoon to move an item on top shelf to edge in order to reach, discussed other recommendations to improve safety with obtaining items.  Upon return to room pt left RW at end of bed, encouraged pt to keep RW with her at all times with mobility to increase safety to which pt reports no room next to bed at home for RW.  Pt continues to utilize bed rails and reports she plans to have  rails upon D/C.  Therapy Documentation Precautions:  Precautions Precautions: Fall Precaution Comments: history of back pain  Restrictions Weight Bearing Restrictions: No Pain: Pain Assessment Pain Assessment: 0-10 Pain Score: 10-Worst pain ever ADL: ADL ADL Comments: see fim  See FIM for current functional status  Therapy/Group: Individual Therapy  [name], Kamen Hanken 05/18/2014, 10:45 AM

## 2014-05-18 NOTE — Progress Notes (Signed)
Physical Therapy Session Note  Patient Details  Name: [name] MRN: [id number] Date of Birth: [birth year]  Today's Date: 05/18/2014 PT Individual Time: 1435-1535 PT Individual Time Calculation (min): 60 min   Short Term Goals: Week 1:  PT Short Term Goal 1 (Week 1): STGs = LTGs  Skilled Therapeutic Interventions/Progress Updates:   Pt received semi reclined in bed, agreeable to therapy. Pt donned socks with extra time and supervision in long sit position, min A to get R sock over heel due to increased RLE pain. Pt transferred supine > sit with HOB flat and no rail, cues for technique with supervision. Pt performed all sit <> stand transfers with supervision using RW. Pt requesting to use bathroom, ambulated to bathroom and to sink to wash hands with supervision. Remainder of session focused on community ambulation and activity tolerance. Pt ambulated from room to elevators using RW with supervision before requesting seated rest for energy conservation and to ride elevator in w/c. Pt continued ambulating with RW on carpeted and tiled surfaces while navigating around obstacles and narrow spaces in gift shop, supervision. Pt challenged dynamic standing balance with reaching to gather items and birthday card without UE support. Pt returned to room and left sitting EOB with daughter present.   Therapy Documentation Precautions:  Precautions Precautions: Fall Precaution Comments: history of back pain  Restrictions Weight Bearing Restrictions: No Pain: Pain Assessment Pain Assessment: 0-10 Pain Score: 5  Pain Type: Chronic pain Pain Location: Knee Pain Orientation: Right;Posterior Pain Descriptors / Indicators: Aching Pain Onset: On-going Pain Intervention(s): Repositioned;Ambulation/increased activity  See FIM for current functional status  Therapy/Group: Individual Therapy  Kerney Elbe 05/18/2014, 4:53 PM

## 2014-05-18 NOTE — Progress Notes (Signed)
Chart and note reviewed. Agree with assessment and intervention. Ian Malkin RD, LDN Inpatient Clinical Dietitian Pager: [phone] After Hours Pager: [phone]

## 2014-05-18 NOTE — Progress Notes (Signed)
Called by the lab patient Critical lab value Potassium 7.2. Delle Reining, PA notified. Order for BMET at midnight. Patient already drink the Kayaxelate 30 g.  Patient asymptomatic.

## 2014-05-18 NOTE — Progress Notes (Signed)
CRITICAL VALUE ALERT  Critical value received: Potassium 7.1  Date of notification:  05/18/2014  Time of notification: 20:31  Critical value read back:yes  Nurse who received alert:  Clista Bernhardt  MD notified (1st page):Pamela Love  Time of first page:  20:33  MD notified (2nd page):  Time of second page:  Responding MD: 20:35  Time MD responded:  20:35

## 2014-05-18 NOTE — Progress Notes (Signed)
53 y.o. 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 She was admitted on 05/07/14 for work up and found to be hypotensive, tachycardic with elevated WBC and lactic acidosis. She was dehydrated and hypoxic and started on IV antibiotics for SIRS. BC X 2 and stools negative. CT head negative for acute changes. CT chest with acute bilateral PE and esophagus distended and fluid filled and lovenox added for treatment and patient wants eliquis for treatment. She was placed on liquid diet and Dr. Hilarie Fredrickson consulted for input. He recommended MRI pancreas as well as MRCP due to h/o chronic pancreatitis. Patient refused MRI study X 2 despite sedation and GI has signed off.  She has had improvement of GI symptoms and tolerated slow advancement of diet. SIRS resolved--unclear source.   Subjective/Complaints: Complains of substantial right leg/ankle pain. Left foot very swollen (right too to lesser extent) Review of Systems -anxious Objective: Vital Signs: Blood pressure 112/78, pulse 111, temperature 98.2 F (36.8 C), temperature source Oral, resp. rate 18, height 5' 5" (1.651 m), weight 49.7 kg (109 lb 9.1 oz), SpO2 92.00%. No results found. Results for orders placed during the hospital encounter of 05/14/14 (from the past 72 hour(s))  URINALYSIS, ROUTINE W REFLEX MICROSCOPIC     Status: None   Collection Time    05/16/14 12:19 PM      Result Value Ref Range   Color, Urine YELLOW  YELLOW   APPearance CLEAR  CLEAR   Specific Gravity, Urine 1.012  1.005 - 1.030   pH 5.5  5.0 - 8.0   Glucose, UA NEGATIVE  NEGATIVE mg/dL   Hgb urine dipstick NEGATIVE  NEGATIVE   Bilirubin Urine NEGATIVE  NEGATIVE   Ketones, ur NEGATIVE  NEGATIVE mg/dL   Protein, ur NEGATIVE  NEGATIVE mg/dL   Urobilinogen, UA 0.2  0.0 - 1.0 mg/dL    Nitrite NEGATIVE  NEGATIVE   Leukocytes, UA NEGATIVE  NEGATIVE   Comment: MICROSCOPIC NOT DONE ON URINES WITH NEGATIVE PROTEIN, BLOOD, LEUKOCYTES, NITRITE, OR GLUCOSE <1000 mg/dL.  TSH     Status: Abnormal   Collection Time    05/17/14  5:00 AM      Result Value Ref Range   TSH 5.560 (*) 0.350 - 4.500 uIU/mL  CBC WITH DIFFERENTIAL     Status: Abnormal   Collection Time    05/17/14  5:00 AM      Result Value Ref Range   WBC 12.8 (*) 4.0 - 10.5 K/uL   Comment: ADJUSTED FOR NUCLEATED RBC'S   RBC 2.69 (*) 3.87 - 5.11 MIL/uL   Hemoglobin 8.1 (*) 12.0 - 15.0 g/dL   HCT 25.8 (*) 36.0 - 46.0 %   MCV 95.9  78.0 - 100.0 fL   MCH 30.1  26.0 - 34.0 pg   MCHC 31.4  30.0 - 36.0 g/dL   RDW 18.9 (*) 11.5 - 15.5 %   Platelets 344  150 - 400 K/uL   Neutrophils Relative % 57  43 - 77 %   Lymphocytes Relative 30  12 - 46 %   Monocytes Relative 11  3 - 12 %   Eosinophils Relative 1  0 - 5 %   Basophils Relative 1  0 - 1 %   Neutro Abs 7.4  1.7 - 7.7 K/uL   Lymphs Abs 3.8  0.7 -  4.0 K/uL   Monocytes Absolute 1.4 (*) 0.1 - 1.0 K/uL   Eosinophils Absolute 0.1  0.0 - 0.7 K/uL   Basophils Absolute 0.1  0.0 - 0.1 K/uL   RBC Morphology POLYCHROMASIA PRESENT     Comment: RARE NRBCs   WBC Morphology MILD LEFT SHIFT (1-5% METAS, OCC MYELO, OCC BANDS)    COMPREHENSIVE METABOLIC PANEL     Status: Abnormal   Collection Time    05/17/14  5:00 AM      Result Value Ref Range   Sodium 146  137 - 147 mEq/L   Potassium 5.7 (*) 3.7 - 5.3 mEq/L   Chloride 113 (*) 96 - 112 mEq/L   CO2 25  19 - 32 mEq/L   Glucose, Bld 111 (*) 70 - 99 mg/dL   BUN 7  6 - 23 mg/dL   Creatinine, Ser 0.62  0.50 - 1.10 mg/dL   Calcium 8.3 (*) 8.4 - 10.5 mg/dL   Total Protein 4.6 (*) 6.0 - 8.3 g/dL   Albumin 1.6 (*) 3.5 - 5.2 g/dL   AST 19  0 - 37 U/L   ALT 13  0 - 35 U/L   Alkaline Phosphatase 98  39 - 117 U/L   Total Bilirubin <0.2 (*) 0.3 - 1.2 mg/dL   GFR calc non Af Amer >90  >90 mL/min   GFR calc Af Amer >90  >90 mL/min    Comment: (NOTE)     The eGFR has been calculated using the CKD EPI equation.     This calculation has not been validated in all clinical situations.     eGFR's persistently <90 mL/min signify possible Chronic Kidney     Disease.   Anion gap 8  5 - 15  MAGNESIUM     Status: Abnormal   Collection Time    05/17/14  5:00 AM      Result Value Ref Range   Magnesium 1.2 (*) 1.5 - 2.5 mg/dL  OCCULT BLOOD X 1 CARD TO LAB, STOOL     Status: Abnormal   Collection Time    05/17/14  5:10 PM      Result Value Ref Range   Fecal Occult Bld POSITIVE (*) NEGATIVE  GLUCOSE, CAPILLARY     Status: None   Collection Time    05/18/14  6:56 AM      Result Value Ref Range   Glucose-Capillary 87  70 - 99 mg/dL     HEENT: normal Cardio: RRR Resp: CTA B/L and unlabored GI: BS positive and NT,ND Extremity:  Pulses positive and 2+ pedal edema right more than left.  Skin:   Intact and Wound Left lat thigh incision CDI no erythema Neuro: Alert/Oriented and Abnormal Motor 3- grip left hand otherwise 5/5 in UEs, RLE 5/5, LLE 3-/5 KE ankle , 2- HF Musc/Skel:  Other Left wrist splint. Right calf and particularly foot painful to palpation and gentle ROM Gen NAD   Assessment/Plan: 1. Functional deficits secondary to Deconditioning due to SIRS which require 3+ hours per day of interdisciplinary therapy in a comprehensive inpatient rehab setting. Physiatrist is providing close team supervision and 24 hour management of active medical problems listed below. Physiatrist and rehab team continue to assess barriers to discharge/monitor patient progress toward functional and medical goals. FIM: FIM - Bathing Bathing Steps Patient Completed: Abdomen;Front perineal area;Buttocks;Right upper leg;Left upper leg Bathing: 3: Mod-Patient completes 5-7 [id number] parts or 50-74%  FIM - Upper Body Dressing/Undressing Upper body dressing/undressing steps patient  completed: Thread/unthread right sleeve of pullover  shirt/dresss;Thread/unthread left sleeve of pullover shirt/dress;Put head through opening of pull over shirt/dress;Pull shirt over trunk Upper body dressing/undressing: 5: Set-up assist to: Obtain clothing/put away FIM - Lower Body Dressing/Undressing Lower body dressing/undressing steps patient completed: Thread/unthread right underwear leg;Thread/unthread left underwear leg;Pull underwear up/down;Thread/unthread right pants leg;Thread/unthread left pants leg;Pull pants up/down;Don/Doff right sock Lower body dressing/undressing: 4: Min-Patient completed 75 plus % of tasks  FIM - Toileting Toileting steps completed by patient: Adjust clothing prior to toileting;Performs perineal hygiene;Adjust clothing after toileting Toileting Assistive Devices: Grab bar or rail for support Toileting: 4: Steadying assist  FIM - Radio producer Devices: Elevated toilet seat Toilet Transfers: 4-To toilet/BSC: Min A (steadying Pt. > 75%)  FIM - Control and instrumentation engineer Devices: [facility] elevated;Walker Bed/Chair Transfer: 6: Supine > Sit: No assist;6: Sit > Supine: No assist  FIM - Locomotion: Wheelchair Distance: 125 Locomotion: Wheelchair: 0: Activity did not occur FIM - Locomotion: Ambulation Locomotion: Ambulation Assistive Devices: Administrator Ambulation/Gait Assistance: 4: Min assist Locomotion: Ambulation: 5: Travels 150 ft or more with supervision/safety issues  Comprehension Comprehension Mode: Auditory Comprehension: 6-Follows complex conversation/direction: With extra time/assistive device  Expression Expression Mode: Verbal Expression: 6-Expresses complex ideas: With extra time/assistive device  Social Interaction Social Interaction: 6-Interacts appropriately with others with medication or extra time (anti-anxiety, antidepressant).  Problem Solving Problem Solving: 5-Solves basic problems: With no assist  Memory Memory: 6-More than  reasonable amt of time  Medical Problem List and Plan:  1. Functional deficits secondary to Deconditioning due to SIRS and chronic malnutrition.  2. Bilateral PE /Anticoagulation: Pharmaceutical: Other (comment)-Eliquis (Has trial card/On medicaid preferred List)  3. Chronic back pain/Neuropathy/Pain Management:  hydrocodone prn 10/325 Q6h (her usual Med).   -will allow 18m hydromorphone daily prn for severe pain.  -Given history of alcohol is some concerns for medication overuse.  Given complaints of severe back pain, recent infection and last MRI greater than 5 years ago repeat lumbar MRI  4. Mood: Team to provide ego support. LCSW to follow for evaluation and support.  5. Neuropsych: This patient is capable of making decisions on Her own behalf.  6. Skin/Wound Care: Maintain adequate hydration and nutritional status to prevent breakdown. Continue ensure pudding.  7. Malnutrition: encouraging better intake/ labs stable  8. Leucocytosis: Likely reactive. Did get short course steroids. Will continue to follow for signs of infection.  9. Chronic pancreatitis with abdominal pain: Appetite improving and feels current medication regimen effective in controlling symptoms.  10 DM type 2: Well controlled--Hgb A1c-4.6. Off medications due to hypoglycemia and poor intake. Will monitor with ac/hs checks and resume medication if indicated.  11. H/o depression/anxiety: continue Effexor bid as well as trazodone at bedtime for chronic insomnia. Change xanax to klonopin. Recheck TSH elevated  12.  Anemia- stool OB +, re-check   -serial H/H  -GI consult---has seen [facility] during this hospitalization 13.  Hyper K will d/c kcl supplements and recheck 14.  Mild elevation TSH, diffuse body aches, edema, will add low dose synthroid 15. Edema/RLE pain----TEDS, rx neuropathic pain  -check uric acid level. LOS (Days) 4 A FACE TO FACE EVALUATION WAS PERFORMED  SWARTZ,ZACHARY T 05/18/2014, 9:07 AM

## 2014-05-18 NOTE — Progress Notes (Signed)
INITIAL NUTRITION ASSESSMENT  Pt meets criteria for SEVERE MALNUTRITION in the context of chronic illness as evidenced by energy intake </= 75% for >/= 1 month and severe muscle mass depletion.   DOCUMENTATION CODES Per approved criteria  -Severe malnutrition in the context of chronic illness -Underweight   INTERVENTION: Continue Ensure Pudding po TID, each supplement provides 170 kcal and 4 grams of protein  NUTRITION DIAGNOSIS: Malnutrition related to chronic illness as evidenced by energy intake </= 75% for >/= 1 month and severe muscle mass depletion.   Goal: Pt to meet >/= 90% of their estimated nutrition needs.  Monitor:  PO intake, weight trends, labs, I/O's  Reason for Assessment: Low BMI  53 y.o. 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  Pt reports she currently has a good appetite. Meal completion has been 50-100%. However, prior to admission pt reports she had a decreased appetite, from constant nausea and vomiting, and would eat on average one meal a day, which lasted for about 1 month. Pt reports she does not know what caused her nausea and vomiting and suspects it may be her medication. Pt reports she has lost weight with her usual body weight of 107 lbs, where she reported last weighing 2 months ago. Pt denies any nausea, stomach pains, or difficulties swallowing/chewing. Pt has been receiving Ensure pudding and likes it. Pt was offered the Ensure drink instead, but reports she favors the Ensure pudding more. Will continue with current orders.  Nutrition Focused Physical Exam:  Subcutaneous Fat:  Orbital Region: N/A Upper Arm Region: Moderate depletion Thoracic and Lumbar Region: WNL  Muscle:  Temple Region: Moderate  depletion Clavicle Bone Region: Severe depletion Clavicle and Acromion Bone Region: Severe depletion Scapular Bone Region: N/A Dorsal Hand: Moderate depletion Patellar Region: WNL Anterior Thigh Region: Moderate depletion Posterior Calf Region: Severe depletion  Edema: +2 LE   Labs: Low calcium, and total bilirubin. High potassium, and chloride.   Height: Ht Readings from Last 1 Encounters:  05/14/14 5\' 5"  (1.651 m)    Weight: Wt Readings from Last 1 Encounters:  05/14/14 109 lb 9.1 oz (49.7 kg)    Ideal Body Weight: 125 lbs  % Ideal Body Weight: 87%  Wt Readings from Last 10 Encounters:  05/14/14 109 lb 9.1 oz (49.7 kg)  05/12/14 85 lb 15.7 oz (39 kg)  10/05/13 92 lb (41.731 kg)  10/20/12 102 lb 15.3 oz (46.7 kg)  07/30/11 94 lb 15.9 oz (43.09 kg)    Usual Body Weight: 107 lbs  % Usual Body Weight: 102%  BMI:  Body mass index is 18.23 kg/(m^2). Underweight  Estimated Nutritional Needs: Kcal: 1650-1850 Protein: 80-90 grams Fluid: 1.65 L - 1.85 L/day  Skin: +2 LE edema  Diet Order: General  EDUCATION NEEDS: -No education needs identified at this time   Intake/Output Summary (Last 24 hours) at 05/18/14 1236 Last data filed at 05/18/14 0800  Gross per 24 hour  Intake   1080 ml  Output      0 ml  Net   1080 ml    Last BM: 8/17   Labs:   Recent Labs Lab 05/11/14 2208  05/13/14 0900 05/14/14 0521 05/15/14 0452 05/17/14 0500  NA  --   < > 146 147  --  146  K  --   < > 3.6* 3.6*  --  5.7*  CL  --   < > 117* 119*  --  113*  CO2  --   < > 15* 18*  --  25  BUN  --   < > 3* 4*  --  7  CREATININE  --   < > 0.59 0.60  --  0.62  CALCIUM  --   < > 6.0* 6.8*  --  8.3*  MG  --   --   --  0.6* 1.4* 1.2*  PHOS 1.7*  --   --   --   --   --   GLUCOSE  --   < > 96 75  --  111*  < > = values in this interval not displayed.  CBG (last 3)   Recent Labs  05/18/14 0656  GLUCAP 87    Scheduled Meds: . antiseptic oral rinse  7 mL Mouth Rinse BID  .  apixaban  10 mg Oral BID   Followed by  . [START ON 05/19/2014] apixaban  5 mg Oral BID  . calcium carbonate  2 tablet Oral TID  . feeding supplement (ENSURE)  1 Container Oral TID BM  . folic acid  1 mg Oral Daily  . gabapentin  400 mg Oral BID   And  . gabapentin  600 mg Oral Q24H  . levothyroxine  25 mcg Oral QAC breakfast  . loratadine  10 mg Oral Daily  . magnesium oxide  400 mg Oral BID  . nystatin   Topical TID  . pantoprazole  40 mg Oral Daily  . sulfaSALAzine  1,000 mg Oral Q breakfast  . traZODone  50 mg Oral QHS  . venlafaxine  25 mg Oral BID WC    Continuous Infusions:   Past Medical History  Diagnosis Date  . Glaucoma   . Diabetes mellitus without complication   . Hypertension   . Neuropathy   . Crohn disease   . Asthma     Past Surgical History  Procedure Laterality Date  . Cesarean section      Marijean NiemannStephanie La, MS, Provisional LDN Pager # [phone] After hours/ weekend pager # [phone]

## 2014-05-18 NOTE — Progress Notes (Signed)
Recreational Therapy Session Note  Patient Details  Name: [name] MRN: [id number] Date of Birth: [birth year] Today's Date: 05/18/2014  Order received & chart reviewed.  Due to ELOS being 5 days, no formal TR eval/treatment implemented.  Will continue to monitor. Candie Gintz 05/18/2014, 8:27 AM

## 2014-05-19 ENCOUNTER — Encounter (HOSPITAL_COMMUNITY): Payer: Medicare Other | Admitting: Occupational Therapy

## 2014-05-19 ENCOUNTER — Encounter (HOSPITAL_COMMUNITY): Payer: Self-pay | Admitting: Gastroenterology

## 2014-05-19 ENCOUNTER — Inpatient Hospital Stay (HOSPITAL_COMMUNITY): Payer: Medicare Other | Admitting: Occupational Therapy

## 2014-05-19 ENCOUNTER — Inpatient Hospital Stay (HOSPITAL_COMMUNITY): Payer: Medicare Other

## 2014-05-19 ENCOUNTER — Inpatient Hospital Stay (HOSPITAL_COMMUNITY): Payer: Medicare Other | Admitting: Physical Therapy

## 2014-05-19 LAB — BASIC METABOLIC PANEL
ANION GAP: 13 (ref 5–15)
ANION GAP: 9 (ref 5–15)
BUN: 10 mg/dL (ref 6–23)
BUN: 9 mg/dL (ref 6–23)
CALCIUM: 8.8 mg/dL (ref 8.4–10.5)
CALCIUM: 9 mg/dL (ref 8.4–10.5)
CO2: 26 meq/L (ref 19–32)
CO2: 26 meq/L (ref 19–32)
Chloride: 107 mEq/L (ref 96–112)
Chloride: 107 mEq/L (ref 96–112)
Creatinine, Ser: 0.58 mg/dL (ref 0.50–1.10)
Creatinine, Ser: 0.6 mg/dL (ref 0.50–1.10)
GFR calc Af Amer: >90 mL/min (ref >90)
Glucose, Bld: 88 mg/dL (ref 70–99)
Glucose, Bld: 92 mg/dL (ref 70–99)
Potassium: 5.6 mEq/L — ABNORMAL HIGH (ref 3.7–5.3)
Potassium: 5.8 mEq/L — ABNORMAL HIGH (ref 3.7–5.3)
SODIUM: 142 meq/L (ref 137–147)
SODIUM: 146 meq/L (ref 137–147)

## 2014-05-19 LAB — OCCULT BLOOD X 1 CARD TO LAB, STOOL: Fecal Occult Bld: POSITIVE — AB

## 2014-05-19 LAB — MAGNESIUM: Magnesium: 1.2 mg/dL — ABNORMAL LOW (ref 1.5–2.5)

## 2014-05-19 MED ORDER — SODIUM POLYSTYRENE SULFONATE 15 GM/60ML PO SUSP
30.0000 g | Freq: Once | ORAL | Status: AC
  Administered 2014-05-19: 30 g via ORAL
  Filled 2014-05-19: qty 120

## 2014-05-19 MED ORDER — HYDROMORPHONE HCL 2 MG PO TABS
4.0000 mg | ORAL_TABLET | Freq: Four times a day (QID) | ORAL | Status: DC | PRN
  Administered 2014-05-19 (×2): 4 mg via ORAL
  Filled 2014-05-19 (×2): qty 2

## 2014-05-19 NOTE — Consult Note (Signed)
Reason for Consult: Guaiac positive anemia Referring Physician: Rehabilitation team  [name] is an 53 y.o. female.  HPI: Patient known to our Dr. Laural Benes and our office computer reviewed and actually her GI symptoms are well-controlled but has a long history of multiple GI issues and recently had some nausea vomiting without blood and does have some colonic Crohn's in the past as well as some hemorrhoidal problem but she has not seen any blood lately and her diarrhea is better and she has not drank in  some time and supposedly she had a colonoscopy last year by a different physician at [facility] but I could not find that report in the computer and a recent CT was okay and her family history is pertinent for a dad with polyps but no other GI issues and she is going to need to be on blood thinners for a while and is doing well in rehabilitation and has no other complaints Past Medical History  Diagnosis Date  . Glaucoma   . Diabetes mellitus without complication   . Hypertension   . Neuropathy   . Crohn disease   . Asthma     Past Surgical History  Procedure Laterality Date  . Cesarean section      No family history on file.  Social History:  reports that she has been smoking.  She does not have any smokeless tobacco history on file. She reports that she drinks alcohol. She reports that she does not use illicit drugs.  Allergies:  Allergies  Allergen Reactions  . Iohexol Other (See Comments)    "severe burning" Patient has received Contrast in 2005 with 13 hour pre-medication, and had no reaction at that time  . Penicillins Hives    Medications: I have reviewed the patient's current medications.  Results for orders placed during the hospital encounter of 05/14/14 (from the past 48 hour(s))  OCCULT BLOOD X 1 CARD TO LAB, STOOL     Status: Abnormal   Collection Time    05/17/14  5:10 PM      Result Value Ref Range   Fecal Occult Bld POSITIVE (*) NEGATIVE  GLUCOSE,  CAPILLARY     Status: None   Collection Time    05/18/14  6:56 AM      Result Value Ref Range   Glucose-Capillary 87  70 - 99 mg/dL  HEMOGLOBIN AND HEMATOCRIT, BLOOD     Status: Abnormal   Collection Time    05/18/14 10:00 AM      Result Value Ref Range   Hemoglobin 8.4 (*) 12.0 - 15.0 g/dL   HCT 24.4 (*) 69.5 - 07.2 %  URIC ACID     Status: None   Collection Time    05/18/14 10:00 AM      Result Value Ref Range   Uric Acid, Serum 3.8  2.4 - 7.0 mg/dL  BASIC METABOLIC PANEL     Status: Abnormal   Collection Time    05/18/14  7:06 PM      Result Value Ref Range   Sodium 137  137 - 147 mEq/L   Potassium 7.1 (*) 3.7 - 5.3 mEq/L   Comment: NO VISIBLE HEMOLYSIS     CRITICAL RESULT CALLED TO, READ BACK BY AND VERIFIED WITH:     W ZONEN,RN 2025 05/18/14 WBOND     REPEATED TO VERIFY   Chloride 103  96 - 112 mEq/L   CO2 26  19 - 32 mEq/L   Glucose, Bld  87  70 - 99 mg/dL   BUN 11  6 - 23 mg/dL   Creatinine, Ser 0.58  0.50 - 1.10 mg/dL   Calcium 9.0  8.4 - 10.5 mg/dL   GFR calc non Af Amer >90  >90 mL/min   GFR calc Af Amer >90  >90 mL/min   Comment: (NOTE)     The eGFR has been calculated using the CKD EPI equation.     This calculation has not been validated in all clinical situations.     eGFR's persistently <90 mL/min signify possible Chronic Kidney     Disease.   Anion gap 8  5 - 15  BASIC METABOLIC PANEL     Status: Abnormal   Collection Time    05/18/14  9:05 PM      Result Value Ref Range   Sodium 136 (*) 137 - 147 mEq/L   Potassium 7.2 (*) 3.7 - 5.3 mEq/L   Comment: CRITICAL RESULT CALLED TO, READ BACK BY AND VERIFIED WITH:     VILLANUYEVA C,RN 05/18/14 2205 WAYK   Chloride 102  96 - 112 mEq/L   CO2 28  19 - 32 mEq/L   Glucose, Bld 76  70 - 99 mg/dL   BUN 11  6 - 23 mg/dL   Creatinine, Ser 0.62  0.50 - 1.10 mg/dL   Calcium 9.0  8.4 - 10.5 mg/dL   GFR calc non Af Amer >90  >90 mL/min   GFR calc Af Amer >90  >90 mL/min   Comment: (NOTE)     The eGFR has been  calculated using the CKD EPI equation.     This calculation has not been validated in all clinical situations.     eGFR's persistently <90 mL/min signify possible Chronic Kidney     Disease.   Anion gap 6  5 - 15  OCCULT BLOOD X 1 CARD TO LAB, STOOL     Status: Abnormal   Collection Time    05/18/14 11:41 PM      Result Value Ref Range   Fecal Occult Bld POSITIVE (*) NEGATIVE  BASIC METABOLIC PANEL     Status: Abnormal   Collection Time    05/18/14 11:58 PM      Result Value Ref Range   Sodium 142  137 - 147 mEq/L   Potassium 5.8 (*) 3.7 - 5.3 mEq/L   Comment: DELTA CHECK NOTED   Chloride 107  96 - 112 mEq/L   CO2 26  19 - 32 mEq/L   Glucose, Bld 88  70 - 99 mg/dL   BUN 10  6 - 23 mg/dL   Creatinine, Ser 0.60  0.50 - 1.10 mg/dL   Calcium 9.0  8.4 - 10.5 mg/dL   GFR calc non Af Amer >90  >90 mL/min   GFR calc Af Amer >90  >90 mL/min   Comment: (NOTE)     The eGFR has been calculated using the CKD EPI equation.     This calculation has not been validated in all clinical situations.     eGFR's persistently <90 mL/min signify possible Chronic Kidney     Disease.   Anion gap 9  5 - 15  BASIC METABOLIC PANEL     Status: Abnormal   Collection Time    05/19/14 12:00 PM      Result Value Ref Range   Sodium 146  137 - 147 mEq/L   Potassium 5.6 (*) 3.7 - 5.3 mEq/L   Chloride 107  96 - 112 mEq/L   CO2 26  19 - 32 mEq/L   Glucose, Bld 92  70 - 99 mg/dL   BUN 9  6 - 23 mg/dL   Creatinine, Ser 0.58  0.50 - 1.10 mg/dL   Calcium 8.8  8.4 - 10.5 mg/dL   GFR calc non Af Amer >90  >90 mL/min   GFR calc Af Amer >90  >90 mL/min   Comment: (NOTE)     The eGFR has been calculated using the CKD EPI equation.     This calculation has not been validated in all clinical situations.     eGFR's persistently <90 mL/min signify possible Chronic Kidney     Disease.   Anion gap 13  5 - 15  MAGNESIUM     Status: Abnormal   Collection Time    05/19/14 12:00 PM      Result Value Ref Range    Magnesium 1.2 (*) 1.5 - 2.5 mg/dL    No results found.  ROS negative except above her primary complaint is her feet the swelling and peripheral neuropathy Blood pressure 126/82, pulse 118, temperature 99.8 F (37.7 C), temperature source Oral, resp. rate 19, height _0  (1.651 m), weight 49.7 kg (109 lb 9.1 oz), SpO2 100.00%. Physical Exam no acute distress vital signs stable afebrile hospital computer reviewed including recent CAT scan and labs abdomen is soft nontender good bowel sound  Assessment/Plan: Multiple medical problems in a patient on blood thinners and multiple GI workups in the past Plan: As an outpatient we'll try to get her previous colonoscopy report from Dr. Benson Norway and in the meantime will proceed with an endoscopy just to be sure there are no significant at risk lesions and have scheduled her for tomorrow at 11 AM and the procedure was discussed with the patient with further workup and plans pending those findings  Caballo E 05/19/2014, 4:34 PM

## 2014-05-19 NOTE — Progress Notes (Signed)
Social Work Patient ID: [name], female   DOB: [birth year], 53 y.o.   MRN: [id number] Met with pt to inform of team conference goals-mod/i level and discharge 8/21.  Progress dependent upon pt and her willingness to push herself and  Get up and do.  She is ecstatic about the discharge and wants to go home.  She will talk with nephew to see if he can transport her home on Friday. Son doesn't drive and they do not have a car.  She has a walker, but does need a tub bench.  Pt aware of how to change PCP on her Medicaid card first before MD can be changed. She has done this before and will work on.  Work toward discharge on Friday.

## 2014-05-19 NOTE — Progress Notes (Signed)
Occupational Therapy Session Note  Patient Details  Name: [name] MRN: [id number] Date of Birth: 29-Jul-1961  Today's Date: 05/19/2014 OT Individual Time: 3254-9826 and 4158-3094 OT Individual Time Calculation (min): 50 min and 41 min   Short Term Goals: Week 1:  OT Short Term Goal 1 (Week 1): STG=LTG due to short LOS  Skilled Therapeutic Interventions/Progress Updates:    1) Engaged in ADL retraining with focus on safety with functional mobility and transfers, and increased independence with self-care tasks of dressing.  Pt in bed upon arrival, reporting pain in bilateral feet due to increased swelling.  Re-educated pt on diet, mobility, and use of TEDS to decrease swelling and encouraged pt to attempt to don TEDS this session.  Pt donned Lt TED, but required assist with Rt due to fatigue and pain.  Pt with urinary urgency and attempted to ambulate to toilet without assist with one stocking and other foot bare, requiring min assist for stability due to quick pace.  Educated on use of RW and unsafe to ambulate without nonskid socks or shoes.  Pt required increased time with TEDS and socks due to internally and externally distracted.  Ambulated a 2nd time to bathroom with RW with increased safety awareness and proper use of RW.  Sidestepping with RW to return to bed with supervision. Pt made up bed with supervision and no LOB during dynamic standing and reaching outside BOS.  2) Upon arrival, pt initially refused treatment session due to waiting on x-ray.  Began to discuss discharge readiness and plan for subsequent therapies prior to d/c to ensure readiness.  Encouraged use of RW and discussed questionable/unsafe decisions pt had made in AM session.  Pt reports understanding and stated, she knew therapist was there to assist.  Again described mod I and pt's need to be safe while son is away.  Pt ambulated to bathroom with RW and distant supervision, maintaining appropriate use of RW during all  mobility this session.  Pt returned to bed and requested to wait for x-ray.  Therapy Documentation Precautions:  Precautions Precautions: Fall Precaution Comments: history of back pain  Restrictions Weight Bearing Restrictions: No Pain:  Pt with c/o pain in bilateral feet, not rated.   ADL: ADL ADL Comments: see fim  See FIM for current functional status  Therapy/Group: Individual Therapy  Rosalio Loud 05/19/2014, 12:26 PM

## 2014-05-19 NOTE — Progress Notes (Signed)
53 y.o. 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 She was admitted on 05/07/14 for work up and found to be hypotensive, tachycardic with elevated WBC and lactic acidosis. She was dehydrated and hypoxic and started on IV antibiotics for SIRS. BC X 2 and stools negative. CT head negative for acute changes. CT chest with acute bilateral PE and esophagus distended and fluid filled and lovenox added for treatment and patient wants eliquis for treatment. She was placed on liquid diet and Dr. Hilarie Fredrickson consulted for input. He recommended MRI pancreas as well as MRCP due to h/o chronic pancreatitis. Patient refused MRI study X 2 despite sedation and GI has signed off.  She has had improvement of GI symptoms and tolerated slow advancement of diet. SIRS resolved--unclear source.   Subjective/Complaints: Diarrhea after kayexalate, increased foot/ankle swelling Review of Systems -anxious Objective: Vital Signs: Blood pressure 112/83, pulse 120, temperature 98.6 F (37 C), temperature source Oral, resp. rate 20, height _0  (1.651 m), weight 49.7 kg (109 lb 9.1 oz), SpO2 100.00%. No results found. Results for orders placed during the hospital encounter of 05/14/14 (from the past 72 hour(s))  URINALYSIS, ROUTINE W REFLEX MICROSCOPIC     Status: None   Collection Time    05/16/14 12:19 PM      Result Value Ref Range   Color, Urine YELLOW  YELLOW   APPearance CLEAR  CLEAR   Specific Gravity, Urine 1.012  1.005 - 1.030   pH 5.5  5.0 - 8.0   Glucose, UA NEGATIVE  NEGATIVE mg/dL   Hgb urine dipstick NEGATIVE  NEGATIVE   Bilirubin Urine NEGATIVE  NEGATIVE   Ketones, ur NEGATIVE  NEGATIVE mg/dL   Protein, ur NEGATIVE  NEGATIVE mg/dL   Urobilinogen, UA 0.2  0.0 - 1.0 mg/dL   Nitrite NEGATIVE  NEGATIVE   Leukocytes,  UA NEGATIVE  NEGATIVE   Comment: MICROSCOPIC NOT DONE ON URINES WITH NEGATIVE PROTEIN, BLOOD, LEUKOCYTES, NITRITE, OR GLUCOSE <1000 mg/dL.  TSH     Status: Abnormal   Collection Time    05/17/14  5:00 AM      Result Value Ref Range   TSH 5.560 (*) 0.350 - 4.500 uIU/mL  CBC WITH DIFFERENTIAL     Status: Abnormal   Collection Time    05/17/14  5:00 AM      Result Value Ref Range   WBC 12.8 (*) 4.0 - 10.5 K/uL   Comment: ADJUSTED FOR NUCLEATED RBC'S   RBC 2.69 (*) 3.87 - 5.11 MIL/uL   Hemoglobin 8.1 (*) 12.0 - 15.0 g/dL   HCT 25.8 (*) 36.0 - 46.0 %   MCV 95.9  78.0 - 100.0 fL   MCH 30.1  26.0 - 34.0 pg   MCHC 31.4  30.0 - 36.0 g/dL   RDW 18.9 (*) 11.5 - 15.5 %   Platelets 344  150 - 400 K/uL   Neutrophils Relative % 57  43 - 77 %   Lymphocytes Relative 30  12 - 46 %   Monocytes Relative 11  3 - 12 %   Eosinophils Relative 1  0 - 5 %   Basophils Relative 1  0 - 1 %   Neutro Abs 7.4  1.7 - 7.7 K/uL   Lymphs Abs 3.8  0.7 - 4.0 K/uL   Monocytes Absolute 1.4 (*) 0.1 -  1.0 K/uL   Eosinophils Absolute 0.1  0.0 - 0.7 K/uL   Basophils Absolute 0.1  0.0 - 0.1 K/uL   RBC Morphology POLYCHROMASIA PRESENT     Comment: RARE NRBCs   WBC Morphology MILD LEFT SHIFT (1-5% METAS, OCC MYELO, OCC BANDS)    COMPREHENSIVE METABOLIC PANEL     Status: Abnormal   Collection Time    05/17/14  5:00 AM      Result Value Ref Range   Sodium 146  137 - 147 mEq/L   Potassium 5.7 (*) 3.7 - 5.3 mEq/L   Chloride 113 (*) 96 - 112 mEq/L   CO2 25  19 - 32 mEq/L   Glucose, Bld 111 (*) 70 - 99 mg/dL   BUN 7  6 - 23 mg/dL   Creatinine, Ser 0.62  0.50 - 1.10 mg/dL   Calcium 8.3 (*) 8.4 - 10.5 mg/dL   Total Protein 4.6 (*) 6.0 - 8.3 g/dL   Albumin 1.6 (*) 3.5 - 5.2 g/dL   AST 19  0 - 37 U/L   ALT 13  0 - 35 U/L   Alkaline Phosphatase 98  39 - 117 U/L   Total Bilirubin <0.2 (*) 0.3 - 1.2 mg/dL   GFR calc non Af Amer >90  >90 mL/min   GFR calc Af Amer >90  >90 mL/min   Comment: (NOTE)     The eGFR has been  calculated using the CKD EPI equation.     This calculation has not been validated in all clinical situations.     eGFR's persistently <90 mL/min signify possible Chronic Kidney     Disease.   Anion gap 8  5 - 15  MAGNESIUM     Status: Abnormal   Collection Time    05/17/14  5:00 AM      Result Value Ref Range   Magnesium 1.2 (*) 1.5 - 2.5 mg/dL  OCCULT BLOOD X 1 CARD TO LAB, STOOL     Status: Abnormal   Collection Time    05/17/14  5:10 PM      Result Value Ref Range   Fecal Occult Bld POSITIVE (*) NEGATIVE  GLUCOSE, CAPILLARY     Status: None   Collection Time    05/18/14  6:56 AM      Result Value Ref Range   Glucose-Capillary 87  70 - 99 mg/dL  HEMOGLOBIN AND HEMATOCRIT, BLOOD     Status: Abnormal   Collection Time    05/18/14 10:00 AM      Result Value Ref Range   Hemoglobin 8.4 (*) 12.0 - 15.0 g/dL   HCT 26.3 (*) 36.0 - 46.0 %  URIC ACID     Status: None   Collection Time    05/18/14 10:00 AM      Result Value Ref Range   Uric Acid, Serum 3.8  2.4 - 7.0 mg/dL  BASIC METABOLIC PANEL     Status: Abnormal   Collection Time    05/18/14  7:06 PM      Result Value Ref Range   Sodium 137  137 - 147 mEq/L   Potassium 7.1 (*) 3.7 - 5.3 mEq/L   Comment: NO VISIBLE HEMOLYSIS     CRITICAL RESULT CALLED TO, READ BACK BY AND VERIFIED WITH:     W ZONEN,RN 2025 05/18/14 WBOND     REPEATED TO VERIFY   Chloride 103  96 - 112 mEq/L   CO2 26  19 - 32 mEq/L  Glucose, Bld 87  70 - 99 mg/dL   BUN 11  6 - 23 mg/dL   Creatinine, Ser 0.58  0.50 - 1.10 mg/dL   Calcium 9.0  8.4 - 10.5 mg/dL   GFR calc non Af Amer >90  >90 mL/min   GFR calc Af Amer >90  >90 mL/min   Comment: (NOTE)     The eGFR has been calculated using the CKD EPI equation.     This calculation has not been validated in all clinical situations.     eGFR's persistently <90 mL/min signify possible Chronic Kidney     Disease.   Anion gap 8  5 - 15  BASIC METABOLIC PANEL     Status: Abnormal   Collection Time     05/18/14  9:05 PM      Result Value Ref Range   Sodium 136 (*) 137 - 147 mEq/L   Potassium 7.2 (*) 3.7 - 5.3 mEq/L   Comment: CRITICAL RESULT CALLED TO, READ BACK BY AND VERIFIED WITH:     VILLANUYEVA C,RN 05/18/14 2205 WAYK   Chloride 102  96 - 112 mEq/L   CO2 28  19 - 32 mEq/L   Glucose, Bld 76  70 - 99 mg/dL   BUN 11  6 - 23 mg/dL   Creatinine, Ser 0.62  0.50 - 1.10 mg/dL   Calcium 9.0  8.4 - 10.5 mg/dL   GFR calc non Af Amer >90  >90 mL/min   GFR calc Af Amer >90  >90 mL/min   Comment: (NOTE)     The eGFR has been calculated using the CKD EPI equation.     This calculation has not been validated in all clinical situations.     eGFR's persistently <90 mL/min signify possible Chronic Kidney     Disease.   Anion gap 6  5 - 15  OCCULT BLOOD X 1 CARD TO LAB, STOOL     Status: Abnormal   Collection Time    05/18/14 11:41 PM      Result Value Ref Range   Fecal Occult Bld POSITIVE (*) NEGATIVE  BASIC METABOLIC PANEL     Status: Abnormal   Collection Time    05/18/14 11:58 PM      Result Value Ref Range   Sodium 142  137 - 147 mEq/L   Potassium 5.8 (*) 3.7 - 5.3 mEq/L   Comment: DELTA CHECK NOTED   Chloride 107  96 - 112 mEq/L   CO2 26  19 - 32 mEq/L   Glucose, Bld 88  70 - 99 mg/dL   BUN 10  6 - 23 mg/dL   Creatinine, Ser 0.60  0.50 - 1.10 mg/dL   Calcium 9.0  8.4 - 10.5 mg/dL   GFR calc non Af Amer >90  >90 mL/min   GFR calc Af Amer >90  >90 mL/min   Comment: (NOTE)     The eGFR has been calculated using the CKD EPI equation.     This calculation has not been validated in all clinical situations.     eGFR's persistently <90 mL/min signify possible Chronic Kidney     Disease.   Anion gap 9  5 - 15     HEENT: normal Cardio: RRR Resp: CTA B/L and unlabored GI: BS positive and NT,ND Extremity:  Pulses positive and 2+ pedal edema right more than left.  Skin:   Intact and Wound Left lat thigh incision CDI no erythema Neuro: Alert/Oriented and Abnormal Motor  3- grip left  hand otherwise 5/5 in UEs, RLE 5/5, LLE 3-/5 KE ankle , 2- HF Musc/Skel:  Other Left wrist splint. Right calf and particularly foot painful to palpation and gentle ROM Gen NAD   Assessment/Plan: 1. Functional deficits secondary to Deconditioning due to SIRS which require 3+ hours per day of interdisciplinary therapy in a comprehensive inpatient rehab setting. Physiatrist is providing close team supervision and 24 hour management of active medical problems listed below. Physiatrist and rehab team continue to assess barriers to discharge/monitor patient progress toward functional and medical goals. Team conference today please see physician documentation under team conference tab, met with team face-to-face to discuss problems,progress, and goals. Formulized individual treatment plan based on medical history, underlying problem and comorbidities. FIM: FIM - Bathing Bathing Steps Patient Completed: Abdomen;Front perineal area;Buttocks;Right upper leg;Left upper leg;Chest;Right Arm;Left Arm;Right lower leg (including foot);Left lower leg (including foot) Bathing: 5: Set-up assist to: Adjust water temp  FIM - Upper Body Dressing/Undressing Upper body dressing/undressing steps patient completed: Thread/unthread right sleeve of pullover shirt/dresss;Thread/unthread left sleeve of pullover shirt/dress;Put head through opening of pull over shirt/dress;Pull shirt over trunk Upper body dressing/undressing: 6: More than reasonable amount of time FIM - Lower Body Dressing/Undressing Lower body dressing/undressing steps patient completed: Thread/unthread right underwear leg;Thread/unthread left underwear leg;Pull underwear up/down;Thread/unthread right pants leg;Thread/unthread left pants leg;Pull pants up/down;Don/Doff right sock;Don/Doff left sock Lower body dressing/undressing: 5: Supervision: Safety issues/verbal cues  FIM - Toileting Toileting steps completed by patient: Adjust clothing prior to  toileting;Performs perineal hygiene;Adjust clothing after toileting Toileting Assistive Devices: Grab bar or rail for support Toileting: 5: Supervision: Safety issues/verbal cues  FIM - Radio producer Devices: Elevated toilet seat;Walker Toilet Transfers: 5-To toilet/BSC: Supervision (verbal cues/safety issues);5-From toilet/BSC: Supervision (verbal cues/safety issues)  FIM - Control and instrumentation engineer Devices: Copy: 5: Bed > Chair or W/C: Supervision (verbal cues/safety issues);5: Chair or W/C > Bed: Supervision (verbal cues/safety issues);5: Supine > Sit: Supervision (verbal cues/safety issues)  FIM - Locomotion: Wheelchair Distance: 100 Locomotion: Wheelchair: 2: Travels 50 - 149 ft with supervision, cueing or coaxing FIM - Locomotion: Ambulation Locomotion: Ambulation Assistive Devices: Administrator Ambulation/Gait Assistance: 5: Supervision Locomotion: Ambulation: 5: Travels 150 ft or more with supervision/safety issues  Comprehension Comprehension Mode: Auditory Comprehension: 6-Follows complex conversation/direction: With extra time/assistive device  Expression Expression Mode: Verbal Expression: 6-Expresses complex ideas: With extra time/assistive device  Social Interaction Social Interaction: 6-Interacts appropriately with others with medication or extra time (anti-anxiety, antidepressant).  Problem Solving Problem Solving: 5-Solves basic problems: With no assist  Memory Memory: 6-More than reasonable amt of time  Medical Problem List and Plan:  1. Functional deficits secondary to Deconditioning due to SIRS and chronic malnutrition.  2. Bilateral PE /Anticoagulation: Pharmaceutical: Other (comment)-Eliquis (Has trial card/On medicaid preferred List)  3. Chronic back pain/Neuropathy/Pain Management:  hydrocodone prn 10/325 Q6h (her usual Med).   -will allow 86m hydromorphone daily prn for  severe pain.  -Given history of alcohol is some concerns for medication overuse.  Given complaints of severe back pain, recent infection and last MRI greater than 5 years ago repeat lumbar MRI, no report  4. Mood: Team to provide ego support. LCSW to follow for evaluation and support.  5. Neuropsych: This patient is capable of making decisions on Her own behalf.  6. Skin/Wound Care: Maintain adequate hydration and nutritional status to prevent breakdown. Continue ensure pudding.  7. Malnutrition: encouraging better intake/ labs stable  8. Leucocytosis: Likely  reactive. Did get short course steroids. Will continue to follow for signs of infection.  9. Chronic pancreatitis with abdominal pain: Appetite improving and feels current medication regimen effective in controlling symptoms.  10 DM type 2: Well controlled--Hgb A1c-4.6. Off medications due to hypoglycemia and poor intake. Will monitor with ac/hs checks and resume medication if indicated.  11. H/o depression/anxiety: continue Effexor bid as well as trazodone at bedtime for chronic insomnia. Change xanax to klonopin. Recheck TSH elevated  12.  Anemia- stool OB +, -serial H/H  -GI consult---has seen [facility] during this hospitalization 13.  Hyper K will d/c kcl supplements and recheck 14.  Mild elevation TSH, diffuse body aches, edema, will add low dose synthroid 15. Edema/RLE pain----TEDS, rx neuropathic pain  -check uric acid level. LOS (Days) 5 A FACE TO FACE EVALUATION WAS PERFORMED  KIRSTEINS,ANDREW E 05/19/2014, 8:50 AM

## 2014-05-19 NOTE — Progress Notes (Signed)
Physical Therapy Session Note  Patient Details  Name: [name] MRN: [id number] Date of Birth: July 16, 1961  Today's Date: 05/19/2014 PT Individual Time: 1430-1528 PT Individual Time Calculation (min): 58 min   Short Term Goals: Week 1:  PT Short Term Goal 1 (Week 1): STGs = LTGs  Skilled Therapeutic Interventions/Progress Updates:   Pt received semi reclined in bed, reporting she is awaiting x-ray but agreeable to therapy. Session focused on balance and activity tolerance with functional tasks. Pt transferred supine > sit with HOB elevated and donned socks sitting EOB with supervision. Pt ambulated with RW to bathroom and sink to wash hands with RW and supervision. Pt ambulated in controlled environment 4 x 100 ft using RW and supervision for visit with therapy dog in hallway and additional trips back to/from room to retrieve phone in order to take pictures. Pt with seated rest while petting dog. Upon returning to room, pt donned gloves and sanitized room, phone, and surfaces in room, ambulating throughout room without RW and supervision. Pt requesting to use bathroom again, ambulated to toilet without AD and supervision and left sitting on commode, handoff with OT.   Therapy Documentation Precautions:  Precautions Precautions: Fall Precaution Comments: history of back pain  Restrictions Weight Bearing Restrictions: No Vital Signs: Therapy Vitals Temp: 99.8 F (37.7 C) Temp src: Oral Pulse Rate: 118 Resp: 19 BP: 126/82 mmHg Patient Position (if appropriate): Lying Oxygen Therapy SpO2: 100 % O2 Device: None (Room air) Pain: Pain Assessment Pain Assessment: 0-10 Pain Score: 5  Pain Type: Chronic pain Pain Location: Knee Pain Orientation: Right;Posterior Pain Descriptors / Indicators: Aching Pain Onset: On-going Pain Intervention(s): Emotional support;Ambulation/increased activity;Distraction  See FIM for current functional status  Therapy/Group: Individual  Therapy  Kerney Elbe 05/19/2014, 3:52 PM

## 2014-05-19 NOTE — Patient Care Conference (Signed)
Inpatient RehabilitationTeam Conference and Plan of Care Update Date: 05/19/2014   Time: 11;10 AM    Patient Name: [name]      Medical Record Number: [id number]  Date of Birth: Aug 18, 1961 Sex: Female         Room/Bed: 4W07C/4W07C-01 Payor Info: Payor: MEDICARE / Plan: MEDICARE PART A AND B / Product Type: *No Product type* /    Admitting Diagnosis: Deconditioned  GI Bleed  Admit Date/Time:  05/14/2014  3:57 PM Admission Comments: No comment available   Primary Diagnosis:  <principal problem not specified> Principal Problem: <principal problem not specified>  Patient Active Problem List   Diagnosis Date Noted  . Physical deconditioning 05/14/2014  . SIRS (systemic inflammatory response syndrome) 05/08/2014  . Nicotine abuse 05/08/2014  . Encephalopathy 10/21/2012  . Hypernatremia 10/15/2012  . Hypokalemia 10/15/2012  . Hypomagnesemia 10/15/2012  . Hypophosphatemia 10/15/2012  . Leukocytosis 10/15/2012  . Normocytic anemia 10/15/2012  . Thrombocytopenia 10/15/2012  . Elevated INR 10/15/2012  . Respiratory failure 10/15/2012  . Metabolic acidosis 10/14/2012  . Acute respiratory failure 10/13/2012  . ARDS (adult respiratory distress syndrome) 10/13/2012  . Crohn's disease 10/13/2012  . Bilateral lower abdominal discomfort 10/12/2012  . H1N1 influenza 10/12/2012  . Sepsis 10/11/2012  . Community acquired pneumonia 10/11/2012  . DIABETES MELLITUS, TYPE II 05/12/2007  . ANXIETY 05/12/2007  . DEPRESSION 05/12/2007  . PERIPHERAL NEUROPATHY 05/12/2007  . HYPERTENSION 05/12/2007  . ALLERGIC RHINITIS 05/12/2007  . ASTHMA 05/12/2007  . CIRRHOSIS 05/12/2007  . PANCREATITIS, HX OF 05/12/2007  . GASTROINTESTINAL HEMORRHAGE, HX OF 05/12/2007    Expected Discharge Date: Expected Discharge Date: 05/21/14  Team Members Present: Physician leading conference: Dr. Claudette Laws Social Worker Present: Dossie Der, LCSW Nurse Present: Other (comment) Rogelia Rohrer) PT  Present: Wanda Plump, PT OT Present: Rosalio Loud, Marye Round, OT SLP Present: Fae Pippin, SLP PPS Coordinator present : Edson Snowball, PT     Current Status/Progress Goal Weekly Team Focus  Medical   chronic pain, multifactorial  manage pain, adjust meds  edema reduction   Bowel/Bladder   Continent of bowel and bladder. LBM 05/18/2014  Remain continent t of bowel and bladder  Time toileting q 2-3 hrs.   Swallow/Nutrition/ Hydration     WFL        ADL's   supervision overall  mod I overall  activity tolerance, pain management, safety during self-care tasks and mobility   Mobility   mod I except supervision for stairs  mod I except supervision for stairs  Generalized strengthening, activity tolerance, standing balance, stairs, community ambulation   Communication     [name]        Safety/Cognition/ Behavioral Observations    No unsafe behaviors        Pain   Pain to posterior rt knee, Hydrocodone 10/325 mg, Dilaudid 4 mg  3 or less  Monitor for  any onset of pain   Skin   Edema to BLE. Excoriated to buttocks. Barrier cream applied  No new skin breakdown/infection  Assess skin q shift.      *See Care Plan and progress notes for long and short-term goals.  Barriers to Discharge: pain limits therapy     Possible Resolutions to Barriers:  see above    Discharge Planning/Teaching Needs:  HOme with son and PCS worker-does not have 24 hr care.  Very needy pt wants every piece of equipment out there.      Team Discussion:  Pain issues long standing-MD addressing.  Refuses MRI agreeable to x-ray of lumbar region. Ted hose for edema in LE's.  Changing to low sodium diet. Pt likes the attention here. Meeting mod/i level goals  Revisions to Treatment Plan:  None   Continued Need for Acute Rehabilitation Level of Care: The patient requires daily medical management by a physician with specialized training in physical medicine and rehabilitation for the following  conditions: Daily direction of a multidisciplinary physical rehabilitation program to ensure safe treatment while eliciting the highest outcome that is of practical value to the patient.: Yes Daily medical management of patient stability for increased activity during participation in an intensive rehabilitation regime.: Yes Daily analysis of laboratory values and/or radiology reports with any subsequent need for medication adjustment of medical intervention for : Other  [name], [name] 05/21/2014, 8:50 AM

## 2014-05-19 NOTE — Progress Notes (Signed)
Occupational Therapy Session Note  Patient Details  Name: MONECA SOTTILE MRN: [id number] Date of Birth: 1960-12-07  Today's Date: 05/19/2014 OT Individual Time: 0370-4888 OT Individual Time Calculation (min): 37 min   Short Term Goals: Week 1:  OT Short Term Goal 1 (Week 1): STG=LTG due to short LOS  Skilled Therapeutic Interventions/Progress Updates:  Upon entering the room, pt in bathroom with physical therapist. PT left the room for OT to continue treatment. Pt performed hygiene and clothing management with supervision. Pt ambulating without use of RW and supervision. Pt standing at sink side to wash hands with supervision for safety. Pt reporting 10/10 pain in B feet. Pt refusal to wear TED hose. B feet swelling with pitting edema present. Pt screams with pt lightly touching surface of foot. Pt agreed to allow therapist to compression wrap B feet in order to decrease current edema. OT educated pt about the importance of wearing compression garments, utilization of exercises, and elevation to decrease edema. Pt repositioned in bed with feet elevated by pillow and bed settings. RN notified. Pt supine in bed with all needed items within reach and eating foot from tray when therapist exited room.   Therapy Documentation Precautions:  Precautions Precautions: Fall Precaution Comments: history of back pain  Restrictions Weight Bearing Restrictions: No Pain: Pain Assessment Pain Assessment: 0-10 Pain Score: 10  Pain Type: Chronic pain Pain Location: Feet Pain Orientation: Right;Left Pain Descriptors / Indicators: Aching Pain Onset: On-going Pain Intervention(s): Emotional support;Ambulation/increased activity;Distraction  See FIM for current functional status  Therapy/Group: Individual Therapy  Lowella Grip 05/19/2014, 4:58 PM

## 2014-05-20 ENCOUNTER — Inpatient Hospital Stay (HOSPITAL_COMMUNITY): Payer: Medicare Other | Admitting: Occupational Therapy

## 2014-05-20 ENCOUNTER — Inpatient Hospital Stay (HOSPITAL_COMMUNITY): Payer: Medicare Other | Admitting: Physical Therapy

## 2014-05-20 ENCOUNTER — Encounter (HOSPITAL_COMMUNITY): Payer: Medicare Other | Admitting: Occupational Therapy

## 2014-05-20 DIAGNOSIS — Z5189 Encounter for other specified aftercare: Secondary | ICD-10-CM

## 2014-05-20 DIAGNOSIS — R5381 Other malaise: Secondary | ICD-10-CM

## 2014-05-20 DIAGNOSIS — E119 Type 2 diabetes mellitus without complications: Secondary | ICD-10-CM

## 2014-05-20 LAB — CBC
HCT: 24 % — ABNORMAL LOW (ref 36.0–46.0)
Hemoglobin: 7.9 g/dL — ABNORMAL LOW (ref 12.0–15.0)
MCH: 31.2 pg (ref 26.0–34.0)
MCHC: 32.9 g/dL (ref 30.0–36.0)
MCV: 94.9 fL (ref 78.0–100.0)
Platelets: 350 10*3/uL (ref 150–400)
RBC: 2.53 MIL/uL — ABNORMAL LOW (ref 3.87–5.11)
RDW: 20 % — AB (ref 11.5–15.5)
WBC: 9.6 10*3/uL (ref 4.0–10.5)

## 2014-05-20 LAB — BASIC METABOLIC PANEL
Anion gap: 14 (ref 5–15)
BUN: 8 mg/dL (ref 6–23)
CALCIUM: 8.5 mg/dL (ref 8.4–10.5)
CO2: 27 mEq/L (ref 19–32)
Chloride: 107 mEq/L (ref 96–112)
Creatinine, Ser: 0.55 mg/dL (ref 0.50–1.10)
GFR calc Af Amer: >90 mL/min (ref >90)
Glucose, Bld: 79 mg/dL (ref 70–99)
POTASSIUM: 4.1 meq/L (ref 3.7–5.3)
SODIUM: 148 meq/L — AB (ref 137–147)

## 2014-05-20 MED ORDER — FUROSEMIDE 20 MG PO TABS: 20.0000 mg | ORAL_TABLET | Freq: Every day | ORAL | Status: DC

## 2014-05-20 MED ORDER — GABAPENTIN 400 MG PO CAPS: 400.0000 mg | ORAL_CAPSULE | Freq: Two times a day (BID) | ORAL | Status: DC

## 2014-05-20 MED ORDER — LEVOTHYROXINE SODIUM 25 MCG PO TABS: 25.0000 ug | ORAL_TABLET | Freq: Every day | ORAL | Status: DC

## 2014-05-20 MED ORDER — APIXABAN 5 MG PO TABS: 5.0000 mg | ORAL_TABLET | Freq: Two times a day (BID) | ORAL | Status: DC

## 2014-05-20 MED ORDER — HYDROMORPHONE HCL 2 MG PO TABS
2.0000 mg | ORAL_TABLET | Freq: Four times a day (QID) | ORAL | Status: DC | PRN
  Administered 2014-05-20 – 2014-05-21 (×4): 2 mg via ORAL
  Filled 2014-05-20 (×4): qty 1

## 2014-05-20 MED ORDER — CALCIUM CARBONATE ANTACID 500 MG PO CHEW: 1.0000 | CHEWABLE_TABLET | Freq: Two times a day (BID) | ORAL | Status: DC

## 2014-05-20 MED ORDER — ALPRAZOLAM 0.5 MG PO TABS: 0.5000 mg | ORAL_TABLET | Freq: Two times a day (BID) | ORAL | Status: DC

## 2014-05-20 MED ORDER — FUROSEMIDE 20 MG PO TABS
20.0000 mg | ORAL_TABLET | Freq: Every day | ORAL | Status: DC
Start: 2014-05-20 — End: 2014-05-20

## 2014-05-20 MED ORDER — ONDANSETRON HCL 4 MG PO TABS: 4.0000 mg | ORAL_TABLET | Freq: Three times a day (TID) | ORAL | Status: DC | PRN

## 2014-05-20 MED ORDER — MAGNESIUM OXIDE 400 (241.3 MG) MG PO TABS: 400.0000 mg | ORAL_TABLET | Freq: Two times a day (BID) | ORAL | Status: DC

## 2014-05-20 MED ORDER — GABAPENTIN 600 MG PO TABS: 600.0000 mg | ORAL_TABLET | Freq: Every day | ORAL | Status: DC

## 2014-05-20 MED ORDER — FUROSEMIDE 20 MG PO TABS
20.0000 mg | ORAL_TABLET | Freq: Every day | ORAL | Status: DC
  Administered 2014-05-20: 20 mg via ORAL
  Filled 2014-05-20 (×2): qty 1

## 2014-05-20 NOTE — Progress Notes (Signed)
[name] SnipesSylvia S [name] 6:54 PM  Subjective: Patient doing fine without any new complaints and no signs of bleeding and we discussed her not eating after midnight for her tests tomorrow Objective: Vital signs stable afebrile no acute distress patient not examined today will be prior to her procedure labs stable unfortunately Dr. Elnoria [name] denies ever seeing her before and we could not find her previous colonoscopy  Assessment: Guaiac positive anemia in patient on blood thinners for PE  Plan: Will proceed with endoscopy tomorrow and okay with me to go home afterward if no significant finding and then we'll probably plan a outpatient colonoscopy in 3 months given her time to become stable from her PE  [facility] CenterMAGOD,[name]

## 2014-05-20 NOTE — Progress Notes (Signed)
Occupational Therapy Session Note  Patient Details  Name: MARONDA SYLER MRN: [id number] Date of Birth: 06-Apr-1961  Today's Date: 05/20/2014 OT Individual Time: 0830-0930 OT Individual Time Calculation (min): 60 min   Short Term Goals: Week 1:  OT Short Term Goal 1 (Week 1): STG=LTG due to short LOS  Skilled Therapeutic Interventions/Progress Updates:    Engaged in ADL retraining with focus on carryover of education to increase independence and safety with self-care tasks and functional mobility in home environment.  Pt utilized RW approx 60% of session, using it to ambulate to/from bathroom, but not short distances sink <> bed.  Pt reports unable to use RW in some areas of home.  Educated on use of RW as pt's pain and activity tolerance fluctuate and encouraged pt to utilize RW at all times at home, pt verbalized understanding.    Therapy Documentation Precautions:  Precautions Precautions: Fall Precaution Comments: history of back pain  Restrictions Weight Bearing Restrictions: No Pain:  Pt with c/o pain in bilateral feet when touched, premedicated ADL: ADL ADL Comments: see fim  See FIM for current functional status  Therapy/Group: Individual Therapy  Rosalio Loud 05/20/2014, 10:45 AM

## 2014-05-20 NOTE — Progress Notes (Signed)
Physical Therapy Discharge Summary  Patient Details  Name: [name] MRN: [id number] Date of Birth: 08-01-1961  Today's Date: 05/20/2014 PT Individual Time: 1105-1215 PT Individual Time Calculation (min): 70 min    Patient has met 6 of 7 long term goals due to improved activity tolerance, improved balance, improved postural control, increased functional strength, increased range of motion, and ability to compensate for deficits.  Patient to discharge at an ambulatory level Modified Independent-supervision. Patient's care partner unavailable to provide the necessary physical assistance at discharge.  Reasons goals not met: Community w/c goals not addressed due to patient at ambulatory level.   Recommendation:  Patient will benefit from ongoing skilled PT services in home health setting to continue to advance safe functional mobility, address ongoing impairments in functional strength, balance, activity tolerance, safety with RW, and minimize fall risk.  Equipment: No equipment provided  Reasons for discharge: treatment goals met and discharge from hospital  Patient/family agrees with progress made and goals achieved: Yes  Skilled Treatment Pt received asleep in bed, easily aroused. Pt verbalized need to don socks and use RW before ambulating in room, made mod I after demonstrating donning socks in long sitting, transferring supine > sit, and ambulating to/from bathroom using RW with mod I. Gait training multiple trials throughout rehab unit in controlled environment up to 150 ft and in home environment x 25 ft using RW, mod I. Pt performed furniture transfers with mod I and demonstrated bed mobility on regular bed in ADL apartment with independence. Pt negotiated up/down 12 steps using 1 rail using step-to pattern with close supervision. Pt performed car transfer with supervision for setup using RW. Berg Balance Scale administered with score of 39/56, pt educated on interpretation of score  and that patient at high risk of falls. Pt verbalized understanding. Pt at times ambulating without RW in controlled environment but continues to demonstrate mod I. Pt returned to room and CSW notified of patient request to speak with her regarding discharge. Pt left standing in room, mod I with RW, and no further questions regarding discharge from therapy.   PT Discharge Precautions/Restrictions Precautions Precautions: Fall Precaution Comments: history of back pain  Restrictions Weight Bearing Restrictions: No Pain Pain Assessment Pain Assessment: 0-10 Pain Score: 9  Pain Type: Chronic pain Pain Location: Leg Pain Orientation: Left;Anterior;Posterior;Right Pain Descriptors / Indicators: Stabbing Pain Frequency: Constant Pain Onset: On-going Patients Stated Pain Goal: 2 Pain Intervention(s): Emotional support;Repositioned;Rest Vision/Perception   No change from baseline  Cognition Orientation Level: Oriented X4 Sensation Sensation Light Touch: Impaired Detail Light Touch Impaired Details: Impaired LLE;Impaired RLE;Impaired RUE;Impaired LUE Additional Comments: Patient inconsistent historian with formal testing Coordination Gross Motor Movements are Fluid and Coordinated: No Fine Motor Movements are Fluid and Coordinated: Yes Coordination and Movement Description: patient coordination limited by swelling, weakness, and tremors Finger Nose Finger Test: [facility] Heel Shin Test: unable to perform due to weak hip flexors Motor  Motor Motor: Within Functional Limits Motor - Skilled Clinical Observations: generalized weakness, tremors  Mobility Bed Mobility Bed Mobility: Supine to Sit;Sit to Supine Supine to Sit: 7: Independent Sit to Supine: 7: Independent Transfers Transfers: Yes Sit to Stand: 6: Modified independent (Device/Increase time) Stand to Sit: 6: Modified independent (Device/Increase time) Stand Pivot Transfers: 6: Modified independent (Device/Increase  time) Locomotion  Ambulation Ambulation: Yes Ambulation/Gait Assistance: 6: Modified independent (Device/Increase time) Ambulation Distance (Feet): 150 Feet Assistive device: Rolling walker Gait Gait: Yes Gait Pattern: Impaired Gait Pattern: Wide base of support;Decreased step length -  right;Decreased step length - left;Decreased hip/knee flexion - left;Decreased hip/knee flexion - right;Decreased dorsiflexion - right;Decreased dorsiflexion - left;Trunk flexed;Lateral trunk lean to right;Lateral trunk lean to left Gait velocity: decreased High Level Ambulation High Level Ambulation: Side stepping;Backwards walking Side Stepping: mod I with RW Backwards Walking: mod I with RW Stairs / Additional Locomotion Stairs: Yes Stairs Assistance: 5: Supervision Stair Management Technique: One rail Right;Step to pattern;Alternating pattern Number of Stairs: 12 Height of Stairs: 6 Wheelchair Mobility Wheelchair Mobility: No (pt ambulatory)  Trunk/Postural Assessment  Cervical Assessment Cervical Assessment: Exceptions to [facility] (forward flexion) Thoracic Assessment Thoracic Assessment: Exceptions to [facility] (slightly kyphotic posture) Lumbar Assessment Lumbar Assessment: Exceptions to [facility] (posterior pelvic tilt) Postural Control Postural Control: Within Functional Limits  Balance Balance Balance Assessed: Yes Standardized Balance Assessment Standardized Balance Assessment: Berg Balance Test Berg Balance Test Sit to Stand: Able to stand  independently using hands Standing Unsupported: Able to stand safely 2 minutes Sitting with Back Unsupported but Feet Supported on Floor or Stool: Able to sit safely and securely 2 minutes Stand to Sit: Controls descent by using hands Transfers: Able to transfer safely, definite need of hands Standing Unsupported with Eyes Closed: Able to stand 10 seconds safely Standing Ubsupported with Feet Together: Needs help to attain position but able to stand for 30  seconds with feet together From Standing, Reach Forward with Outstretched Arm: Can reach confidently >25 cm (10") From Standing Position, Pick up Object from Floor: Able to pick up shoe safely and easily From Standing Position, Turn to Look Behind Over each Shoulder: Looks behind from both sides and weight shifts well Turn 360 Degrees: Able to turn 360 degrees safely but slowly Standing Unsupported, Alternately Place Feet on Step/Stool: Able to complete >2 steps/needs minimal assist Standing Unsupported, One Foot in Front: Needs help to step but can hold 15 seconds Standing on One Leg: Tries to lift leg/unable to hold 3 seconds but remains standing independently Total Score: 39/56 Extremity Assessment  RUE Assessment RUE Assessment: Exceptions to [facility] RUE Strength RUE Overall Strength: Deficits (generalized weakness) LUE Assessment LUE Assessment: Exceptions to [facility] LUE Strength LUE Overall Strength: Deficits (generalized weakness) RLE Assessment RLE Assessment: Exceptions to [facility] RLE PROM (degrees) Overall PROM Right Lower Extremity: Deficits RLE Overall PROM Comments: Limited by edema/pain RLE Strength RLE Overall Strength: Deficits RLE Overall Strength Comments: grossly assessed 2+/5 to 3-/5 LLE Assessment LLE Assessment: Exceptions to [facility] LLE PROM (degrees) Overall PROM Left Lower Extremity: Deficits LLE Overall PROM Comments: Limited by edema/pain LLE Strength LLE Overall Strength: Deficits LLE Overall Strength Comments: grossly 2+/5 to 3-/5  See FIM for current functional status  Laretta Alstrom 05/20/2014, 12:21 PM

## 2014-05-20 NOTE — Progress Notes (Signed)
53 y.o. 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 She was admitted on 05/07/14 for work up and found to be hypotensive, tachycardic with elevated WBC and lactic acidosis. She was dehydrated and hypoxic and started on IV antibiotics for SIRS. BC X 2 and stools negative. CT head negative for acute changes. CT chest with acute bilateral PE and esophagus distended and fluid filled and lovenox added for treatment and patient wants eliquis for treatment. She was placed on liquid diet and Dr. Hilarie Fredrickson consulted for input. He recommended MRI pancreas as well as MRCP due to h/o chronic pancreatitis. Patient refused MRI study X 2 despite sedation and GI has signed off.  She has had improvement of GI symptoms and tolerated slow advancement of diet. SIRS resolved--unclear source.   Subjective/Complaints: Scheduled for EGD, ate breakfast Review of Systems -anxious Objective: Vital Signs: Blood pressure 115/92, pulse 129, temperature 98.6 F (37 C), temperature source Oral, resp. rate 19, height 5' 5"  (1.651 m), weight 47.31 kg (104 lb 4.8 oz), SpO2 99.00%. Dg Lumbar Spine 2-3 Views  05/19/2014   CLINICAL DATA:  Low back pain with limited mobility. No known injury.  EXAM: LUMBAR SPINE - 2-3 VIEW  COMPARISON:  Abdominal pelvic CT 05/07/2014.  FINDINGS: The bones appear somewhat demineralized. There are 5 lumbar type vertebral bodies. The alignment is normal. There is no evidence acute fracture or pars defect. The lumbar disc spaces are preserved. Vascular calcifications in the pelvis appear unchanged. There is gas throughout the small bowel and colon.  IMPRESSION: No acute lumbar spine findings or significant spondylosis.   Electronically Signed   By: Camie Patience M.D.   On: 05/19/2014 20:38   Results for orders placed  during the hospital encounter of 05/14/14 (from the past 72 hour(s))  OCCULT BLOOD X 1 CARD TO LAB, STOOL     Status: Abnormal   Collection Time    05/17/14  5:10 PM      Result Value Ref Range   Fecal Occult Bld POSITIVE (*) NEGATIVE  GLUCOSE, CAPILLARY     Status: None   Collection Time    05/18/14  6:56 AM      Result Value Ref Range   Glucose-Capillary 87  70 - 99 mg/dL  HEMOGLOBIN AND HEMATOCRIT, BLOOD     Status: Abnormal   Collection Time    05/18/14 10:00 AM      Result Value Ref Range   Hemoglobin 8.4 (*) 12.0 - 15.0 g/dL   HCT 26.3 (*) 36.0 - 46.0 %  URIC ACID     Status: None   Collection Time    05/18/14 10:00 AM      Result Value Ref Range   Uric Acid, Serum 3.8  2.4 - 7.0 mg/dL  BASIC METABOLIC PANEL     Status: Abnormal   Collection Time    05/18/14  7:06 PM      Result Value Ref Range   Sodium 137  137 - 147 mEq/L   Potassium 7.1 (*) 3.7 - 5.3 mEq/L   Comment: NO VISIBLE HEMOLYSIS     CRITICAL RESULT CALLED TO, READ BACK BY AND VERIFIED WITH:     W ZONEN,RN 2025 05/18/14 WBOND     REPEATED TO VERIFY   Chloride 103  96 - 112 mEq/L   CO2 26  19 -  32 mEq/L   Glucose, Bld 87  70 - 99 mg/dL   BUN 11  6 - 23 mg/dL   Creatinine, Ser 0.58  0.50 - 1.10 mg/dL   Calcium 9.0  8.4 - 10.5 mg/dL   GFR calc non Af Amer >90  >90 mL/min   GFR calc Af Amer >90  >90 mL/min   Comment: (NOTE)     The eGFR has been calculated using the CKD EPI equation.     This calculation has not been validated in all clinical situations.     eGFR's persistently <90 mL/min signify possible Chronic Kidney     Disease.   Anion gap 8  5 - 15  BASIC METABOLIC PANEL     Status: Abnormal   Collection Time    05/18/14  9:05 PM      Result Value Ref Range   Sodium 136 (*) 137 - 147 mEq/L   Potassium 7.2 (*) 3.7 - 5.3 mEq/L   Comment: CRITICAL RESULT CALLED TO, READ BACK BY AND VERIFIED WITH:     VILLANUYEVA C,RN 05/18/14 2205 WAYK   Chloride 102  96 - 112 mEq/L   CO2 28  19 - 32 mEq/L    Glucose, Bld 76  70 - 99 mg/dL   BUN 11  6 - 23 mg/dL   Creatinine, Ser 0.62  0.50 - 1.10 mg/dL   Calcium 9.0  8.4 - 10.5 mg/dL   GFR calc non Af Amer >90  >90 mL/min   GFR calc Af Amer >90  >90 mL/min   Comment: (NOTE)     The eGFR has been calculated using the CKD EPI equation.     This calculation has not been validated in all clinical situations.     eGFR's persistently <90 mL/min signify possible Chronic Kidney     Disease.   Anion gap 6  5 - 15  OCCULT BLOOD X 1 CARD TO LAB, STOOL     Status: Abnormal   Collection Time    05/18/14 11:41 PM      Result Value Ref Range   Fecal Occult Bld POSITIVE (*) NEGATIVE  BASIC METABOLIC PANEL     Status: Abnormal   Collection Time    05/18/14 11:58 PM      Result Value Ref Range   Sodium 142  137 - 147 mEq/L   Potassium 5.8 (*) 3.7 - 5.3 mEq/L   Comment: DELTA CHECK NOTED   Chloride 107  96 - 112 mEq/L   CO2 26  19 - 32 mEq/L   Glucose, Bld 88  70 - 99 mg/dL   BUN 10  6 - 23 mg/dL   Creatinine, Ser 0.60  0.50 - 1.10 mg/dL   Calcium 9.0  8.4 - 10.5 mg/dL   GFR calc non Af Amer >90  >90 mL/min   GFR calc Af Amer >90  >90 mL/min   Comment: (NOTE)     The eGFR has been calculated using the CKD EPI equation.     This calculation has not been validated in all clinical situations.     eGFR's persistently <90 mL/min signify possible Chronic Kidney     Disease.   Anion gap 9  5 - 15  BASIC METABOLIC PANEL     Status: Abnormal   Collection Time    05/19/14 12:00 PM      Result Value Ref Range   Sodium 146  137 - 147 mEq/L   Potassium 5.6 (*) 3.7 -  5.3 mEq/L   Chloride 107  96 - 112 mEq/L   CO2 26  19 - 32 mEq/L   Glucose, Bld 92  70 - 99 mg/dL   BUN 9  6 - 23 mg/dL   Creatinine, Ser 0.58  0.50 - 1.10 mg/dL   Calcium 8.8  8.4 - 10.5 mg/dL   GFR calc non Af Amer >90  >90 mL/min   GFR calc Af Amer >90  >90 mL/min   Comment: (NOTE)     The eGFR has been calculated using the CKD EPI equation.     This calculation has not been  validated in all clinical situations.     eGFR's persistently <90 mL/min signify possible Chronic Kidney     Disease.   Anion gap 13  5 - 15  MAGNESIUM     Status: Abnormal   Collection Time    05/19/14 12:00 PM      Result Value Ref Range   Magnesium 1.2 (*) 1.5 - 2.5 mg/dL  CBC     Status: Abnormal   Collection Time    05/20/14  7:00 AM      Result Value Ref Range   WBC 9.6  4.0 - 10.5 K/uL   RBC 2.53 (*) 3.87 - 5.11 MIL/uL   Hemoglobin 7.9 (*) 12.0 - 15.0 g/dL   HCT 24.0 (*) 36.0 - 46.0 %   MCV 94.9  78.0 - 100.0 fL   MCH 31.2  26.0 - 34.0 pg   MCHC 32.9  30.0 - 36.0 g/dL   RDW 20.0 (*) 11.5 - 15.5 %   Platelets 350  150 - 400 K/uL     HEENT: normal Cardio: RRR Resp: CTA B/L and unlabored GI: BS positive and NT,ND Extremity:  Pulses positive and 2+ pedal edema right more than left.  Skin:   Intact and Wound Left lat thigh incision CDI no erythema Neuro: Alert/Oriented and Abnormal Motor 3- grip left hand otherwise 5/5 in UEs, RLE 5/5, LLE 3-/5 KE ankle , 2- HF Musc/Skel:  Other Left wrist splint. Right calf and particularly foot painful to palpation and gentle ROM Gen NAD   Assessment/Plan: 1. Functional deficits secondary to Deconditioning due to SIRS which require 3+ hours per day of interdisciplinary therapy in a comprehensive inpatient rehab setting. Physiatrist is providing close team supervision and 24 hour management of active medical problems listed below. Physiatrist and rehab team continue to assess barriers to discharge/monitor patient progress toward functional and medical goals.  FIM: FIM - Bathing Bathing Steps Patient Completed: Abdomen;Front perineal area;Buttocks;Right upper leg;Left upper leg;Chest;Right Arm;Left Arm;Right lower leg (including foot);Left lower leg (including foot) Bathing: 5: Set-up assist to: Adjust water temp  FIM - Upper Body Dressing/Undressing Upper body dressing/undressing steps patient completed: Thread/unthread right sleeve  of pullover shirt/dresss;Thread/unthread left sleeve of pullover shirt/dress;Put head through opening of pull over shirt/dress;Pull shirt over trunk Upper body dressing/undressing: 6: More than reasonable amount of time FIM - Lower Body Dressing/Undressing Lower body dressing/undressing steps patient completed: Thread/unthread right underwear leg;Thread/unthread left underwear leg;Pull underwear up/down;Thread/unthread right pants leg;Thread/unthread left pants leg;Pull pants up/down;Don/Doff right sock;Don/Doff left sock Lower body dressing/undressing: 5: Supervision: Safety issues/verbal cues  FIM - Toileting Toileting steps completed by patient: Adjust clothing prior to toileting;Performs perineal hygiene;Adjust clothing after toileting Toileting Assistive Devices: Grab bar or rail for support Toileting: 5: Supervision: Safety issues/verbal cues  FIM - Radio producer Devices: Elevated toilet seat;Walker Toilet Transfers: 4-To toilet/BSC: Min A (steadying Pt. >  75%);4-From toilet/BSC: Min A (steadying Pt. > 75%);5-To toilet/BSC: Supervision (verbal cues/safety issues);5-From toilet/BSC: Supervision (verbal cues/safety issues)  FIM - Bed/Chair Transfer Bed/Chair Transfer Assistive Devices: Copy: 5: Bed > Chair or W/C: Supervision (verbal cues/safety issues);5: Chair or W/C > Bed: Supervision (verbal cues/safety issues);5: Supine > Sit: Supervision (verbal cues/safety issues)  FIM - Locomotion: Wheelchair Distance: 100 Locomotion: Wheelchair: 0: Activity did not occur FIM - Locomotion: Ambulation Locomotion: Ambulation Assistive Devices: Walker - Rolling;Other (comment) (no AD) Ambulation/Gait Assistance: 5: Supervision Locomotion: Ambulation: 5: Travels 150 ft or more with supervision/safety issues  Comprehension Comprehension Mode: Auditory Comprehension: 6-Follows complex conversation/direction: With extra time/assistive  device  Expression Expression Mode: Verbal Expression: 6-Expresses complex ideas: With extra time/assistive device  Social Interaction Social Interaction: 6-Interacts appropriately with others with medication or extra time (anti-anxiety, antidepressant).  Problem Solving Problem Solving: 5-Solves basic problems: With no assist  Memory Memory: 6-More than reasonable amt of time  Medical Problem List and Plan:  1. Functional deficits secondary to Deconditioning due to SIRS and chronic malnutrition.  2. Bilateral PE /Anticoagulation: Pharmaceutical: Other (comment)-Eliquis (Has trial card/On medicaid preferred List)  3. Chronic back pain/Neuropathy/Pain Management:  hydrocodone prn 10/325 Q6h (her usual Med).   -wean 59m hydromorphone daily prn for severe pain.  -Given history of alcohol is some concerns for medication overuse.  Given complaints of severe back pain, recent infection and last MRI greater than 5 years ago repeat lumbar MRI,pt refused, LS spine xray ok, CT abd allowed lumbar axial views which look ok     4. Mood: Team to provide ego support. LCSW to follow for evaluation and support.  5. Neuropsych: This patient is capable of making decisions on Her own behalf.  6. Skin/Wound Care: Maintain adequate hydration and nutritional status to prevent breakdown. Continue ensure pudding.  7. Malnutrition: encouraging better intake/ labs stable  8. Leucocytosis:resolved 9. Chronic pancreatitis with abdominal pain: Appetite improving and feels current medication regimen effective in controlling symptoms.  10 DM type 2: Well controlled--Hgb A1c-4.6. Off medications due to hypoglycemia and poor intake. Will monitor with ac/hs checks and resume medication if indicated.  11. H/o depression/anxiety: continue Effexor bid as well as trazodone at bedtime for chronic insomnia. Change xanax to klonopin. Recheck TSH elevated  12.  Anemia- stool OB +, -serial H/H  -GI consult---? Reschedule EGD  prior to D/C 13.  Hyper K improved off supplements 14.  Mild elevation TSH, diffuse body aches, edema, will add low dose synthroid 15. Edema/RLE pain----this is related to alb level of 1.6, need compression , prn lasix   LOS (Days) 6 A FACE TO FACE EVALUATION WAS PERFORMED  Stephenia Vogan E 05/20/2014, 8:44 AM

## 2014-05-20 NOTE — Progress Notes (Signed)
Recreational Therapy Session Note  Patient Details  Name: MELONI THUNSTROM MRN: [id number] Date of Birth: 05/31/1961 Today's Date: 05/20/2014  LATE ENTRY from 05/19/2014  Pt participated in animal assisted activity/therapy standing & seated with supervision.  Nayelie Gionfriddo 05/20/2014, 8:16 AM

## 2014-05-20 NOTE — Progress Notes (Signed)
ANTICOAGULATION CONSULT NOTE - Follow Up Consult  Pharmacy Consult for apixaban Indication: PE  Allergies  Allergen Reactions  . Iohexol Other (See Comments)    "severe burning" Patient has received Contrast in 2005 with 13 hour pre-medication, and had no reaction at that time  . Penicillins Hives    Patient Measurements: Height: 5\' 5"  (165.1 cm) Weight: 104 lb 4.8 oz (47.31 kg) IBW/kg (Calculated) : 57 Heparin Dosing Weight:   Vital Signs: Temp: 98.6 F (37 C) (08/20 0542) Temp src: Oral (08/20 0542) BP: 115/92 mmHg (08/20 0550) Pulse Rate: 129 (08/20 0550)  Labs:  Recent Labs  05/18/14 1000  05/18/14 2105 05/18/14 2358 05/19/14 1200 05/20/14 0700  HGB 8.4*  --   --   --   --  7.9*  HCT 26.3*  --   --   --   --  24.0*  PLT  --   --   --   --   --  350  CREATININE  --   < > 0.62 0.60 0.58  --   < > = values in this interval not displayed.  Estimated Creatinine Clearance: 59.3 ml/min (by C-G formula based on Cr of 0.58).   Medications:  Scheduled:  . antiseptic oral rinse  7 mL Mouth Rinse BID  . apixaban  5 mg Oral BID  . calcium carbonate  2 tablet Oral TID  . feeding supplement (ENSURE)  1 Container Oral TID BM  . folic acid  1 mg Oral Daily  . gabapentin  400 mg Oral BID   And  . gabapentin  600 mg Oral Q24H  . levothyroxine  25 mcg Oral QAC breakfast  . loratadine  10 mg Oral Daily  . magnesium oxide  400 mg Oral BID  . nystatin   Topical TID  . pantoprazole  40 mg Oral Daily  . sulfaSALAzine  1,000 mg Oral Q breakfast  . traZODone  50 mg Oral QHS  . venlafaxine  25 mg Oral BID WC   Infusions:    Assessment: 53 yo female with PE is currently on apixaban therapy.  Hgb down a to 7.9 and plt 350 K Goal of Therapy:   Monitor platelets by anticoagulation protocol: Yes   Plan:  -Cont apixaban 5 mg po bid -CBC q72h minimum   [name], [facility] 05/20/2014,8:09 AM

## 2014-05-20 NOTE — Progress Notes (Signed)
Pt scheduled for endoscopy at 1100 this morning. Pt received early breakfast tray and started to eat breakfast before staff could take tray away- supposed to be NPO for procedure. Contacted endoscopy lab who contacted GI MD to let them know. Endoscopy rescheduled for 1100 tomorrow morning. Pt instructed to eat like normal today and have nothing by mouth after midnight tonight- signs posted and order confirmed. Will obtain consent for procedure as ordered. Mick Sell RN

## 2014-05-20 NOTE — Progress Notes (Signed)
Occupational Therapy Discharge Summary  Patient Details  Name: [name] MRN: [id number] Date of Birth: 06-Nov-1960  Patient has met 7 of 7 long term goals due to improved activity tolerance, improved balance and improved awareness.  Patient to discharge at overall Modified Independent level.  Patient's care partner is independent to provide the necessary assistance at discharge.  Due to mod I level, no formal family education completed.  Pt with hired assist 3 hrs/day.  Reasons goals not met: N/A  Recommendation:  Patient will benefit from ongoing skilled OT services in home health setting to continue to advance functional skills in the area of BADL, iADL and Reduce care partner burden.  Equipment: tub transfer bench  Reasons for discharge: treatment goals met and discharge from hospital  Patient/family agrees with progress made and goals achieved: Yes  OT Discharge Precautions/Restrictions  Precautions Precautions: Fall Precaution Comments: history of back pain  Restrictions Weight Bearing Restrictions: No General   Vital Signs Therapy Vitals Temp: 98.7 F (37.1 C) Temp src: Oral Pulse Rate: 100 Resp: 17 BP: 108/74 mmHg Patient Position (if appropriate): Lying Oxygen Therapy SpO2: 100 % O2 Device: None (Room air) Pain Pain Assessment Pain Assessment: Faces Pain Score: 5  Faces Pain Scale: Hurts a little bit Pain Type: Acute pain Pain Location: Leg Pain Orientation: Right;Left Pain Descriptors / Indicators: Aching;Sore Pain Frequency: Constant Pain Onset: On-going Patients Stated Pain Goal: 2 Pain Intervention(s): Repositioned ADL ADL ADL Comments: See FIM Vision/Perception  Vision- History Baseline Vision/History: Wears glasses Wears Glasses: Reading only Patient Visual Report: No change from baseline Vision- Assessment Vision Assessment?: No apparent visual deficits  Cognition Overall Cognitive Status: Within Functional Limits for tasks  assessed Arousal/Alertness: Awake/alert Orientation Level: Oriented X4 Sensation Sensation Light Touch: Impaired Detail Light Touch Impaired Details: Impaired LLE;Impaired RLE;Impaired RUE;Impaired LUE Proprioception: Appears Intact Additional Comments: Patient inconsistent historian with formal testing Coordination Gross Motor Movements are Fluid and Coordinated: No Fine Motor Movements are Fluid and Coordinated: Yes Coordination and Movement Description: patient coordination limited by swelling, weakness, and tremors Finger Nose Finger Test: [facility] Motor    Mobility     Trunk/Postural Assessment     Balance Balance Balance Assessed: Yes Standardized Balance Assessment Standardized Balance Assessment: Berg Balance Test Berg Balance Test Sit to Stand: Able to stand  independently using hands Standing Unsupported: Able to stand safely 2 minutes Sitting with Back Unsupported but Feet Supported on Floor or Stool: Able to sit safely and securely 2 minutes Stand to Sit: Controls descent by using hands Transfers: Able to transfer safely, definite need of hands Standing Unsupported with Eyes Closed: Able to stand 10 seconds safely Standing Ubsupported with Feet Together: Needs help to attain position but able to stand for 30 seconds with feet together From Standing, Reach Forward with Outstretched Arm: Can reach confidently >25 cm (10") From Standing Position, Pick up Object from Floor: Able to pick up shoe safely and easily From Standing Position, Turn to Look Behind Over each Shoulder: Looks behind from both sides and weight shifts well Turn 360 Degrees: Able to turn 360 degrees safely but slowly Standing Unsupported, Alternately Place Feet on Step/Stool: Able to complete >2 steps/needs minimal assist Standing Unsupported, One Foot in Front: Needs help to step but can hold 15 seconds Standing on One Leg: Tries to lift leg/unable to hold 3 seconds but remains standing  independently Total Score: 39 Extremity/Trunk Assessment RUE Assessment RUE Assessment: Exceptions to WFL (WFL ROM, however generalized weakness) LUE Assessment LUE Assessment:  Exceptions to [facility] (WFL ROM, however generalized weakness)  See FIM for current functional status  [name], [facility] 05/20/2014, 3:54 PM

## 2014-05-20 NOTE — Progress Notes (Signed)
Occupational Therapy Session Note  Patient Details  Name: [name] MRN: [id number] Date of Birth: 1961-07-22  Today's Date: 05/20/2014 OT Individual Time: 1401-1501 OT Individual Time Calculation (min): 60 min   Skilled Therapeutic Interventions/Progress Updates:    Pt began session by transitioning to sitting from supine with HOB up with independence.  She donned her gripper socks with independence sitting EOB before walking to the bathroom and performing toileting task.  She then transitioned to the sink for washing her hands and then back to the EOB.  Therapist helped apply knee length TEDs to help reduce swelling in both LEs.  She was able to tolerate but did express pain in both LEs as they were donned.  Once finished she ambulated to the ADL apartment for practice with floor transfers in case of fall.  Pt educated on the proper technique to roll from her bottom onto her knees and then attempt to bring the stronger LE (right) up into position once in tall kneeling to help push up from the floor.  Pt also instructed to use her UEs for support and assistance as well on the surface of a couch or the bed to help pull her up.  When attempting pt was able to transition from sitting to tall kneeling but needed mod facilitation to transition to RLE into position as well coming to standing position.  Feel pt needs continued practice with this at home and will need assistance if she needs to get up off of the floor.  Ambulated back to room to conclude session with one rest break after approximately 100 ft.   Therapy Documentation Precautions:  Precautions Precautions: Fall Precaution Comments: history of back pain  Restrictions Weight Bearing Restrictions: No  Vital Signs: Therapy Vitals Temp: 98.7 F (37.1 C) Temp src: Oral Pulse Rate: 100 Resp: 17 BP: 108/74 mmHg Patient Position (if appropriate): Lying Oxygen Therapy SpO2: 100 % O2 Device: None (Room air) Pain: Pain  Assessment Pain Assessment: Faces Pain Score: 5  Faces Pain Scale: Hurts a little bit Pain Type: Acute pain Pain Location: Leg Pain Orientation: Right;Left Pain Descriptors / Indicators: Aching;Sore Pain Frequency: Constant Pain Onset: On-going Patients Stated Pain Goal: 2 Pain Intervention(s): Repositioned ADL: ADL ADL Comments: see fim  See FIM for current functional status  Therapy/Group: Individual Therapy  Myrtis Maille OTR/L 05/20/2014, 3:17 PM

## 2014-05-20 NOTE — Progress Notes (Signed)
Social Work Patient ID: [name] NewerSylvia S [name], female   DOB: 06/14/1961, 53 y.o.   MRN: [id number] Pt has concerns with having test tomorrow and then being discharged home would rather go home Sat.  Will let Pam-PA and MD know of pt's Concern.  She has ben made mod/i in her room and is doing well.  Having home health come out and requested [facility], since has had then before. Work toward discharge.

## 2014-05-21 ENCOUNTER — Encounter (HOSPITAL_COMMUNITY): Payer: Self-pay | Admitting: *Deleted

## 2014-05-21 ENCOUNTER — Encounter (HOSPITAL_COMMUNITY)
Admission: RE | Disposition: A | Discharge status: Home Health | Payer: Self-pay | Source: Intra-hospital | Attending: Physical Medicine & Rehabilitation

## 2014-05-21 HISTORY — PX: ESOPHAGOGASTRODUODENOSCOPY: SHX5428

## 2014-05-21 LAB — BASIC METABOLIC PANEL
Anion gap: 10 (ref 5–15)
BUN: 10 mg/dL (ref 6–23)
CALCIUM: 8.2 mg/dL — AB (ref 8.4–10.5)
CO2: 29 mEq/L (ref 19–32)
Chloride: 106 mEq/L (ref 96–112)
Creatinine, Ser: 0.57 mg/dL (ref 0.50–1.10)
GFR calc Af Amer: >90 mL/min (ref >90)
Glucose, Bld: 81 mg/dL (ref 70–99)
POTASSIUM: 4.1 meq/L (ref 3.7–5.3)
Sodium: 145 mEq/L (ref 137–147)

## 2014-05-21 SURGERY — EGD (ESOPHAGOGASTRODUODENOSCOPY)
Anesthesia: Moderate Sedation

## 2014-05-21 MED ORDER — SODIUM CHLORIDE 0.9 % IV SOLN
INTRAVENOUS | Status: DC
  Administered 2014-05-21: 10 mL via INTRAVENOUS

## 2014-05-21 MED ORDER — ONDANSETRON HCL 4 MG PO TABS: 4.0000 mg | ORAL_TABLET | Freq: Three times a day (TID) | ORAL | Status: DC | PRN

## 2014-05-21 MED ORDER — APIXABAN 5 MG PO TABS: 5.0000 mg | ORAL_TABLET | Freq: Two times a day (BID) | ORAL | Status: DC

## 2014-05-21 MED ORDER — FENTANYL CITRATE 0.05 MG/ML IJ SOLN
INTRAMUSCULAR | Status: DC | PRN
Start: 2014-05-21 — End: 2014-05-21
  Administered 2014-05-21 (×4): 25 ug via INTRAVENOUS

## 2014-05-21 MED ORDER — MAGNESIUM OXIDE 400 (241.3 MG) MG PO TABS: 400.0000 mg | ORAL_TABLET | Freq: Two times a day (BID) | ORAL | Status: DC

## 2014-05-21 MED ORDER — APIXABAN 5 MG PO TABS: 10.0000 mg | ORAL_TABLET | Freq: Two times a day (BID) | ORAL | Status: DC

## 2014-05-21 MED ORDER — LEVOTHYROXINE SODIUM 25 MCG PO TABS: 25.0000 ug | ORAL_TABLET | Freq: Every day | ORAL | Status: DC

## 2014-05-21 MED ORDER — PANTOPRAZOLE SODIUM 20 MG PO TBEC: 40.0000 mg | DELAYED_RELEASE_TABLET | Freq: Every day | ORAL | Status: DC

## 2014-05-21 MED ORDER — CALCIUM CARBONATE ANTACID 500 MG PO CHEW: 2.0000 | CHEWABLE_TABLET | Freq: Two times a day (BID) | ORAL | Status: DC

## 2014-05-21 MED ORDER — DIPHENHYDRAMINE HCL 50 MG/ML IJ SOLN
INTRAMUSCULAR | Status: AC
  Filled 2014-05-21: qty 1

## 2014-05-21 MED ORDER — TRAZODONE HCL 50 MG PO TABS: 50.0000 mg | ORAL_TABLET | Freq: Every day | ORAL | Status: DC

## 2014-05-21 MED ORDER — MIDAZOLAM HCL 5 MG/ML IJ SOLN
INTRAMUSCULAR | Status: AC
  Filled 2014-05-21: qty 1

## 2014-05-21 MED ORDER — AMITRIPTYLINE HCL 50 MG PO TABS: 50.0000 mg | ORAL_TABLET | Freq: Every evening | ORAL | Status: DC | PRN

## 2014-05-21 MED ORDER — FENTANYL CITRATE 0.05 MG/ML IJ SOLN
INTRAMUSCULAR | Status: AC
  Filled 2014-05-21: qty 2

## 2014-05-21 MED ORDER — MIDAZOLAM HCL 10 MG/2ML IJ SOLN
INTRAMUSCULAR | Status: DC | PRN
Start: 2014-05-21 — End: 2014-05-21
  Administered 2014-05-21 (×4): 2 mg via INTRAVENOUS

## 2014-05-21 NOTE — Progress Notes (Signed)
53 y.o. 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 She was admitted on 05/07/14 for work up and found to be hypotensive, tachycardic with elevated WBC and lactic acidosis. She was dehydrated and hypoxic and started on IV antibiotics for SIRS. BC X 2 and stools negative. CT head negative for acute changes. CT chest with acute bilateral PE and esophagus distended and fluid filled and lovenox added for treatment and patient wants eliquis for treatment. She was placed on liquid diet and Dr. Hilarie Fredrickson consulted for input. He recommended MRI pancreas as well as MRCP due to h/o chronic pancreatitis. Patient refused MRI study X 2 despite sedation and GI has signed off.  She has had improvement of GI symptoms and tolerated slow advancement of diet. SIRS resolved--unclear source.   Subjective/Complaints: Scheduled for EGD, ate breakfast Review of Systems -anxious Objective: Vital Signs: Blood pressure 105/65, pulse 99, temperature 99.2 F (37.3 C), temperature source Oral, resp. rate 18, height _0  (1.651 m), weight 47.31 kg (104 lb 4.8 oz), SpO2 100.00%. Dg Lumbar Spine 2-3 Views  05/19/2014   CLINICAL DATA:  Low back pain with limited mobility. No known injury.  EXAM: LUMBAR SPINE - 2-3 VIEW  COMPARISON:  Abdominal pelvic CT 05/07/2014.  FINDINGS: The bones appear somewhat demineralized. There are 5 lumbar type vertebral bodies. The alignment is normal. There is no evidence acute fracture or pars defect. The lumbar disc spaces are preserved. Vascular calcifications in the pelvis appear unchanged. There is gas throughout the small bowel and colon.  IMPRESSION: No acute lumbar spine findings or significant spondylosis.   Electronically Signed   By: Camie Patience M.D.   On: 05/19/2014 20:38   Results for orders placed  during the hospital encounter of 05/14/14 (from the past 72 hour(s))  HEMOGLOBIN AND HEMATOCRIT, BLOOD     Status: Abnormal   Collection Time    05/18/14 10:00 AM      Result Value Ref Range   Hemoglobin 8.4 (*) 12.0 - 15.0 g/dL   HCT 26.3 (*) 36.0 - 46.0 %  URIC ACID     Status: None   Collection Time    05/18/14 10:00 AM      Result Value Ref Range   Uric Acid, Serum 3.8  2.4 - 7.0 mg/dL  BASIC METABOLIC PANEL     Status: Abnormal   Collection Time    05/18/14  7:06 PM      Result Value Ref Range   Sodium 137  137 - 147 mEq/L   Potassium 7.1 (*) 3.7 - 5.3 mEq/L   Comment: NO VISIBLE HEMOLYSIS     CRITICAL RESULT CALLED TO, READ BACK BY AND VERIFIED WITH:     Jerene Pitch 2025 05/18/14 WBOND     REPEATED TO VERIFY   Chloride 103  96 - 112 mEq/L   CO2 26  19 - 32 mEq/L   Glucose, Bld 87  70 - 99 mg/dL   BUN 11  6 - 23 mg/dL   Creatinine, Ser 0.58  0.50 - 1.10 mg/dL   Calcium 9.0  8.4 - 10.5 mg/dL   GFR calc non Af Amer >90  >90 mL/min   GFR calc Af Amer >90  >90 mL/min   Comment: (NOTE)     The eGFR has been calculated using the CKD EPI equation.  This calculation has not been validated in all clinical situations.     eGFR's persistently <90 mL/min signify possible Chronic Kidney     Disease.   Anion gap 8  5 - 15  BASIC METABOLIC PANEL     Status: Abnormal   Collection Time    05/18/14  9:05 PM      Result Value Ref Range   Sodium 136 (*) 137 - 147 mEq/L   Potassium 7.2 (*) 3.7 - 5.3 mEq/L   Comment: CRITICAL RESULT CALLED TO, READ BACK BY AND VERIFIED WITH:     VILLANUYEVA C,RN 05/18/14 2205 WAYK   Chloride 102  96 - 112 mEq/L   CO2 28  19 - 32 mEq/L   Glucose, Bld 76  70 - 99 mg/dL   BUN 11  6 - 23 mg/dL   Creatinine, Ser 0.62  0.50 - 1.10 mg/dL   Calcium 9.0  8.4 - 10.5 mg/dL   GFR calc non Af Amer >90  >90 mL/min   GFR calc Af Amer >90  >90 mL/min   Comment: (NOTE)     The eGFR has been calculated using the CKD EPI equation.     This calculation has not  been validated in all clinical situations.     eGFR's persistently <90 mL/min signify possible Chronic Kidney     Disease.   Anion gap 6  5 - 15  OCCULT BLOOD X 1 CARD TO LAB, STOOL     Status: Abnormal   Collection Time    05/18/14 11:41 PM      Result Value Ref Range   Fecal Occult Bld POSITIVE (*) NEGATIVE  BASIC METABOLIC PANEL     Status: Abnormal   Collection Time    05/18/14 11:58 PM      Result Value Ref Range   Sodium 142  137 - 147 mEq/L   Potassium 5.8 (*) 3.7 - 5.3 mEq/L   Comment: DELTA CHECK NOTED   Chloride 107  96 - 112 mEq/L   CO2 26  19 - 32 mEq/L   Glucose, Bld 88  70 - 99 mg/dL   BUN 10  6 - 23 mg/dL   Creatinine, Ser 0.60  0.50 - 1.10 mg/dL   Calcium 9.0  8.4 - 10.5 mg/dL   GFR calc non Af Amer >90  >90 mL/min   GFR calc Af Amer >90  >90 mL/min   Comment: (NOTE)     The eGFR has been calculated using the CKD EPI equation.     This calculation has not been validated in all clinical situations.     eGFR's persistently <90 mL/min signify possible Chronic Kidney     Disease.   Anion gap 9  5 - 15  BASIC METABOLIC PANEL     Status: Abnormal   Collection Time    05/19/14 12:00 PM      Result Value Ref Range   Sodium 146  137 - 147 mEq/L   Potassium 5.6 (*) 3.7 - 5.3 mEq/L   Chloride 107  96 - 112 mEq/L   CO2 26  19 - 32 mEq/L   Glucose, Bld 92  70 - 99 mg/dL   BUN 9  6 - 23 mg/dL   Creatinine, Ser 0.58  0.50 - 1.10 mg/dL   Calcium 8.8  8.4 - 10.5 mg/dL   GFR calc non Af Amer >90  >90 mL/min   GFR calc Af Amer >90  >90 mL/min   Comment: (NOTE)  The eGFR has been calculated using the CKD EPI equation.     This calculation has not been validated in all clinical situations.     eGFR's persistently <90 mL/min signify possible Chronic Kidney     Disease.   Anion gap 13  5 - 15  MAGNESIUM     Status: Abnormal   Collection Time    05/19/14 12:00 PM      Result Value Ref Range   Magnesium 1.2 (*) 1.5 - 2.5 mg/dL  CBC     Status: Abnormal   Collection  Time    05/20/14  7:00 AM      Result Value Ref Range   WBC 9.6  4.0 - 10.5 K/uL   RBC 2.53 (*) 3.87 - 5.11 MIL/uL   Hemoglobin 7.9 (*) 12.0 - 15.0 g/dL   HCT 24.0 (*) 36.0 - 46.0 %   MCV 94.9  78.0 - 100.0 fL   MCH 31.2  26.0 - 34.0 pg   MCHC 32.9  30.0 - 36.0 g/dL   RDW 20.0 (*) 11.5 - 15.5 %   Platelets 350  150 - 400 K/uL  BASIC METABOLIC PANEL     Status: Abnormal   Collection Time    05/20/14  7:00 AM      Result Value Ref Range   Sodium 148 (*) 137 - 147 mEq/L   Potassium 4.1  3.7 - 5.3 mEq/L   Chloride 107  96 - 112 mEq/L   CO2 27  19 - 32 mEq/L   Glucose, Bld 79  70 - 99 mg/dL   BUN 8  6 - 23 mg/dL   Creatinine, Ser 0.55  0.50 - 1.10 mg/dL   Calcium 8.5  8.4 - 10.5 mg/dL   GFR calc non Af Amer >90  >90 mL/min   GFR calc Af Amer >90  >90 mL/min   Comment: (NOTE)     The eGFR has been calculated using the CKD EPI equation.     This calculation has not been validated in all clinical situations.     eGFR's persistently <90 mL/min signify possible Chronic Kidney     Disease.   Anion gap 14  5 - 15  BASIC METABOLIC PANEL     Status: Abnormal   Collection Time    05/21/14  5:00 AM      Result Value Ref Range   Sodium 145  137 - 147 mEq/L   Potassium 4.1  3.7 - 5.3 mEq/L   Chloride 106  96 - 112 mEq/L   CO2 29  19 - 32 mEq/L   Glucose, Bld 81  70 - 99 mg/dL   BUN 10  6 - 23 mg/dL   Creatinine, Ser 0.57  0.50 - 1.10 mg/dL   Calcium 8.2 (*) 8.4 - 10.5 mg/dL   GFR calc non Af Amer >90  >90 mL/min   GFR calc Af Amer >90  >90 mL/min   Comment: (NOTE)     The eGFR has been calculated using the CKD EPI equation.     This calculation has not been validated in all clinical situations.     eGFR's persistently <90 mL/min signify possible Chronic Kidney     Disease.   Anion gap 10  5 - 15     HEENT: normal Cardio: RRR Resp: CTA B/L and unlabored GI: BS positive and NT,ND Extremity:  Pulses positive and 2+ pedal edema right more than left.  Skin:   Intact and Wound Left  lat thigh incision CDI no erythema Neuro: Alert/Oriented and Abnormal Motor 3- grip left hand otherwise 5/5 in UEs, RLE 5/5, LLE 3-/5 KE ankle , 2- HF Musc/Skel:  Other Left wrist splint. Right calf and particularly foot painful to palpation and gentle ROM Gen NAD   Assessment/Plan: 1. Functional deficits secondary to Deconditioning due to SIRS  Plan d/c home after EGD See d/c summary   FIM: FIM - Bathing Bathing Steps Patient Completed: Abdomen;Front perineal area;Buttocks;Right upper leg;Left upper leg;Chest;Right Arm;Left Arm;Right lower leg (including foot);Left lower leg (including foot) Bathing: 6: More than reasonable amount of time  FIM - Upper Body Dressing/Undressing Upper body dressing/undressing steps patient completed: Thread/unthread right sleeve of pullover shirt/dresss;Thread/unthread left sleeve of pullover shirt/dress;Put head through opening of pull over shirt/dress;Pull shirt over trunk Upper body dressing/undressing: 6: More than reasonable amount of time FIM - Lower Body Dressing/Undressing Lower body dressing/undressing steps patient completed: Thread/unthread right underwear leg;Thread/unthread left underwear leg;Pull underwear up/down;Thread/unthread right pants leg;Thread/unthread left pants leg;Pull pants up/down;Don/Doff right sock;Don/Doff left sock Lower body dressing/undressing: 6: More than reasonable amount of time  FIM - Toileting Toileting steps completed by patient: Adjust clothing prior to toileting;Performs perineal hygiene;Adjust clothing after toileting Toileting Assistive Devices: Grab bar or rail for support Toileting: 6: Assistive device: No helper  FIM - Radio producer Devices: Nurse, learning disability Transfers: 6-Assistive device: No helper;6-To toilet/ BSC;6-From toilet/BSC  FIM - Control and instrumentation engineer Devices: Environmental consultant;Arm rests Bed/Chair Transfer: 7: Supine > Sit: No assist;7:  Sit > Supine: No assist;6: Bed > Chair or W/C: No assist;6: Chair or W/C > Bed: No assist  FIM - Locomotion: Wheelchair Distance: 100 Locomotion: Wheelchair: 0: Activity did not occur FIM - Locomotion: Ambulation Locomotion: Ambulation Assistive Devices: Administrator Ambulation/Gait Assistance: 6: Modified independent (Device/Increase time) Locomotion: Ambulation: 6: Travels 150 ft or more with assistive device/no helper  Comprehension Comprehension Mode: Auditory Comprehension: 6-Follows complex conversation/direction: With extra time/assistive device  Expression Expression Mode: Verbal Expression: 6-Expresses complex ideas: With extra time/assistive device  Social Interaction Social Interaction: 6-Interacts appropriately with others with medication or extra time (anti-anxiety, antidepressant).  Problem Solving Problem Solving: 5-Solves basic problems: With no assist  Memory Memory: 6-More than reasonable amt of time  Medical Problem List and Plan:  1. Functional deficits secondary to Deconditioning due to SIRS and chronic malnutrition.  2. Bilateral PE /Anticoagulation: Pharmaceutical: Other (comment)-Eliquis (Has trial card/On medicaid preferred List)  3. Chronic back pain/Neuropathy/Pain Management:  hydrocodone prn 10/325 Q6h (her usual Med).   -wean 41m hydromorphone daily prn for severe pain.  -Given history of alcohol is some concerns for medication overuse.  Given complaints of severe back pain, recent infection and last MRI greater than 5 years ago repeat lumbar MRI,pt refused, LS spine xray ok, CT abd allowed lumbar axial views which look ok     4. Mood: Team to provide ego support. LCSW to follow for evaluation and support.  5. Neuropsych: This patient is capable of making decisions on Her own behalf.  6. Skin/Wound Care: Maintain adequate hydration and nutritional status to prevent breakdown. Continue ensure pudding.  7. Malnutrition: encouraging better intake/  labs stable  8. Leucocytosis:resolved 9. Chronic pancreatitis with abdominal pain: Appetite improving and feels current medication regimen effective in controlling symptoms.  10 DM type 2: Well controlled--Hgb A1c-4.6. Off medications due to hypoglycemia and poor intake. Will monitor with ac/hs checks and resume medication if indicated.  11. H/o depression/anxiety: continue Effexor bid as  well as trazodone at bedtime for chronic insomnia. Change xanax to klonopin. Recheck TSH elevated  12.  Anemia- stool OB +, -serial H/H  -GI consult--- Reschedule EGD prior to D/C 13.  Hyper K resolved 14.  Mild elevation TSH, diffuse body aches, edema, will add low dose synthroid 15. Edema/RLE pain----this is related to alb level of 1.6, need compression , prn lasix   LOS (Days) 7 A FACE TO FACE EVALUATION WAS PERFORMED  Samyra Limb E 05/21/2014, 8:39 AM

## 2014-05-21 NOTE — Progress Notes (Signed)
Social Work Patient ID: [name], female   DOB: 02/10/1961, 53 y.o.   MRN: [id number] Long conversation with pt regarding her being discharged today.  She was told she could stay, unsure by who. Pt aware MD feels she is ready for discharge and no medical concerns.  She feels overwhelmed right now.  Told her to relax take her medicines and eat the tray she is getting, main issue is getting ride home.  Her other issues will not be solved today ie, handicapped housing, assistance with electric bill, etc.  Now wants a SBQC and aware of the private pay due to insurnace covered rolling walker four years ago.  She will privately pay for this.  have added it tot he order along with the bedside commode she needs.  Pt aware to get herself a ride and make some calls regarding going home today.

## 2014-05-21 NOTE — Discharge Planning (Signed)
Patient tolerated Upper GI procedure today. Tolerating Heart Healthy diet without problems.Complains of minor pain relieved by Diulad 2mg  every 6hours. Skin intact. BLE edema improving with po Lasix 40mg . Patient refuses to wear ted hose or ace wrap. Discharge instructions discussed with patient via Marissa NestlePam Love PA. Patient indicated understanding of discharge plan.

## 2014-05-21 NOTE — Progress Notes (Signed)
[name] SnipesSylvia S [name] 10:43 AM  Subjective: Patient without any new complaints and no signs of bleeding and her endoscopy was rediscussed  Objective: Vital signs stable afebrile no acute distress no new lab exam please see pre-assessment evaluation  Assessment: Guaiac positive anemia in patient on blood thinners  Plan: Okay to proceed with endoscopy today and then probably can home per rehabilitation team and I will see her back in one month as an outpatient to probably repeat CBC and schedule outpatient elective colonoscopy in roughly 3 months since recent CT was okay  [facility] BirminghamMAGOD,Deveney Bayon E

## 2014-05-21 NOTE — Progress Notes (Signed)
Social Work Lucy Chris, LCSW Social Worker Signed  Patient Care Conference Service date: 05/19/2014 2:34 PM  Inpatient RehabilitationTeam Conference and Plan of Care Update Date: 05/19/2014   Time: 11;10 AM     Patient Name: [name]       Medical Record Number: [id number]   Date of Birth: 1960/10/10 Sex: Female         Room/Bed: 4W07C/4W07C-01 Payor Info: Payor: MEDICARE / Plan: MEDICARE PART A AND B / Product Type: *No Product type* /   Admitting Diagnosis: Deconditioned  GI Bleed   Admit Date/Time:  05/14/2014  3:57 PM Admission Comments: No comment available   Primary Diagnosis:  <principal problem not specified> Principal Problem: <principal problem not specified>    Patient Active Problem List     Diagnosis  Date Noted   .  Physical deconditioning  05/14/2014   .  SIRS (systemic inflammatory response syndrome)  05/08/2014   .  Nicotine abuse  05/08/2014   .  Encephalopathy  10/21/2012   .  Hypernatremia  10/15/2012   .  Hypokalemia  10/15/2012   .  Hypomagnesemia  10/15/2012   .  Hypophosphatemia  10/15/2012   .  Leukocytosis  10/15/2012   .  Normocytic anemia  10/15/2012   .  Thrombocytopenia  10/15/2012   .  Elevated INR  10/15/2012   .  Respiratory failure  10/15/2012   .  Metabolic acidosis  10/14/2012   .  Acute respiratory failure  10/13/2012   .  ARDS (adult respiratory distress syndrome)  10/13/2012   .  Crohn's disease  10/13/2012   .  Bilateral lower abdominal discomfort  10/12/2012   .  H1N1 influenza  10/12/2012   .  Sepsis  10/11/2012   .  Community acquired pneumonia  10/11/2012   .  DIABETES MELLITUS, TYPE II  05/12/2007   .  ANXIETY  05/12/2007   .  DEPRESSION  05/12/2007   .  PERIPHERAL NEUROPATHY  05/12/2007   .  HYPERTENSION  05/12/2007   .  ALLERGIC RHINITIS  05/12/2007   .  ASTHMA  05/12/2007   .  CIRRHOSIS  05/12/2007   .  PANCREATITIS, HX OF  05/12/2007   .  GASTROINTESTINAL HEMORRHAGE, HX OF  05/12/2007     Expected  Discharge Date: Expected Discharge Date: 05/21/14  Team Members Present: Physician leading conference: Dr. Claudette Laws Social Worker Present: Dossie Der, LCSW Nurse Present: Other (comment) Rogelia Rohrer) PT Present: Wanda Plump, PT OT Present: Rosalio Loud, Marye Round, OT SLP Present: Fae Pippin, SLP PPS Coordinator present : Edson Snowball, PT        Current Status/Progress  Goal  Weekly Team Focus   Medical     chronic pain, multifactorial  manage pain, adjust meds  edema reduction   Bowel/Bladder     Continent of bowel and bladder. LBM 05/18/2014  Remain continent t of bowel and bladder  Time toileting q 2-3 hrs.   Swallow/Nutrition/ Hydration     WFL       ADL's     supervision overall  mod I overall  activity tolerance, pain management, safety during self-care tasks and mobility   Mobility     mod I except supervision for stairs  mod I except supervision for stairs  Generalized strengthening, activity tolerance, standing balance, stairs, community ambulation   Communication     [facility]       Safety/Cognition/ Behavioral Observations    No unsafe behaviors  Pain     Pain to posterior rt knee, Hydrocodone 10/325 mg, Dilaudid 4 mg  3 or less  Monitor for  any onset of pain   Skin     Edema to BLE. Excoriated to buttocks. Barrier cream applied  No new skin breakdown/infection  Assess skin q shift.     *See Care Plan and progress notes for long and short-term goals.    Barriers to Discharge:  pain limits therapy      Possible Resolutions to Barriers:    see above      Discharge Planning/Teaching Needs:    HOme with son and PCS worker-does not have 24 hr care.  Very needy pt wants every piece of equipment out there.      Team Discussion:    Pain issues long standing-MD addressing.  Refuses MRI agreeable to x-ray of lumbar region. Ted hose for edema in LE's.  Changing to low sodium diet. Pt likes the attention here. Meeting mod/i level goals    Revisions to Treatment Plan:    None    Continued Need for Acute Rehabilitation Level of Care: The patient requires daily medical management by a physician with specialized training in physical medicine and rehabilitation for the following conditions: Daily direction of a multidisciplinary physical rehabilitation program to ensure safe treatment while eliciting the highest outcome that is of practical value to the patient.: Yes Daily medical management of patient stability for increased activity during participation in an intensive rehabilitation regime.: Yes Daily analysis of laboratory values and/or radiology reports with any subsequent need for medication adjustment of medical intervention for : Other  [name], Sharmon Cheramie G 05/21/2014, 8:50 AM          Patient ID: [name], female   DOB: 09/12/1961, 53 y.o.   MRN: [id number]

## 2014-05-21 NOTE — Progress Notes (Signed)
Social Work Discharge Note Discharge Note  The overall goal for the admission was met for:   Discharge location: Yes-HOME WITH SON WHO IS IN AND OUT  Length of Stay: Yes-7 DAYS  Discharge activity level: Yes-MOD/I LEVEL  Home/community participation: Yes  Services provided included: MD, RD, PT, OT, RN, CM, TR, Pharmacy and SW  Financial Services: Medicare and Medicaid  Follow-up services arranged: Home Health: [facility]-PT,OT,RN, DME: [facility] and Patient/Family request agency HH: PREF HAS HAD BEFORE, DME: USED BEFORE WILL ADD HHSW  TO ADDRESS PT'S HOUSING ISSUES AND ASSIST WITH ANY ELIGIBLE COMMUNITY RESOURCES.  Comments (or additional information):PT MADE GOOD PROGRESS LIKES IT HERE AND WANTS TO STAY. RESUMED PCS SERVICES, ENCOURAGED TO KEEP ACTIVE INSTEAD OF LAYING IN BED AT HOME. PT TO CHANGE HER PCP WILL NEED TO CHANGE ON MA CARD FIRST KNOWS THE PROCESS AND WILL DO ON HER OWN  Patient/Family verbalized understanding of follow-up arrangements: Yes  Individual responsible for coordination of the follow-up plan: SELF  Confirmed correct DME delivered: ,  G 05/21/2014    ,  G 

## 2014-05-21 NOTE — Op Note (Signed)
[facility] [address] [place], [address]   ENDOSCOPY PROCEDURE REPORT  PATIENT: [name], [name]  MR#: [id number] BIRTHDATE: [birth year] , 53  yrs. old GENDER: Female  ENDOSCOPIST: Vida Rigger, MD REFERRED BY:   rehabilitation  PROCEDURE DATE:  05/21/2014 PROCEDURE:   EGD, diagnostic ASA CLASS:   Class II INDICATIONS:Anemia.  episodic guaiac positivity in a patient on blood thinners  MEDICATIONS: Fentanyl 100 mcg IV and Versed 8 mg IV  TOPICAL ANESTHETIC:used  DESCRIPTION OF PROCEDURE:   After the risks benefits and alternatives of the procedure were thoroughly explained, informed consent was obtained.  The Pentax Gastroscope [id number]  endoscope was introduced through the mouth and advanced to the second portion of the duodenum , limited by Without limitations.   The instrument was slowly withdrawn as the mucosa was fully examined.there was no signs of bleeding and the patient tolerated the procedure well there was no obvious immediate complication         FINDINGS:1 questionable mild gastritis versus gastropathy 2. Otherwise within normal limits EGD including probable look at the ampulla3. No signs of bleeding or significant at risk lesions  COMPLICATIONS:none  ENDOSCOPIC IMPRESSION: above   RECOMMENDATIONS:advance diet okay to go home and followup with me in one month to recheck CBC and GI symptoms and probably schedule repeat colonoscopy using propofol in 3 months since CAT scan was okay unless we can find her most recent colonoscopy report which she keeps changing her mind on the date that that occurred   REPEAT EXAM: as needed   _______________________________ Vida Rigger, MD eSigned:  Vida Rigger, MD 05/21/2014 11:11 AM    CC:  PATIENT NAME:  [name], [name] MR#: [id number]

## 2014-05-24 ENCOUNTER — Encounter (HOSPITAL_COMMUNITY): Payer: Self-pay | Admitting: Gastroenterology

## 2014-05-24 DIAGNOSIS — Z86711 Personal history of pulmonary embolism: Secondary | ICD-10-CM | POA: Diagnosis present

## 2014-05-24 DIAGNOSIS — I2699 Other pulmonary embolism without acute cor pulmonale: Secondary | ICD-10-CM

## 2014-05-24 DIAGNOSIS — E46 Unspecified protein-calorie malnutrition: Secondary | ICD-10-CM | POA: Diagnosis not present

## 2014-05-24 DIAGNOSIS — R195 Other fecal abnormalities: Secondary | ICD-10-CM | POA: Diagnosis present

## 2014-05-24 DIAGNOSIS — K861 Other chronic pancreatitis: Secondary | ICD-10-CM | POA: Diagnosis not present

## 2014-05-24 DIAGNOSIS — A419 Sepsis, unspecified organism: Secondary | ICD-10-CM | POA: Diagnosis not present

## 2014-05-24 DIAGNOSIS — R5381 Other malaise: Secondary | ICD-10-CM | POA: Diagnosis not present

## 2014-05-24 DIAGNOSIS — Z7901 Long term (current) use of anticoagulants: Secondary | ICD-10-CM | POA: Diagnosis not present

## 2014-05-24 HISTORY — DX: Other pulmonary embolism without acute cor pulmonale: I26.99

## 2014-05-24 NOTE — Discharge Summary (Signed)
Physician Discharge Summary  Patient ID: [name] MRN: [id number] DOB/AGE: 1961/08/27 53 y.o.  Admit date: 05/14/2014 Discharge date: 05/21/14  Discharge Diagnoses:  Principal Problem:   Physical deconditioning Active Problems:   PERIPHERAL NEUROPATHY   HYPERTENSION   CIRRHOSIS   PANCREATITIS, HX OF   Bilateral lower abdominal discomfort   Hypokalemia   Hypomagnesemia   Normocytic anemia   Heme positive stool   Pulmonary embolus   Discharged Condition: Stable.   Significant Diagnostic Studies:  Labs:  Basic Metabolic Panel:  Recent Labs Lab 05/18/14 1906 05/18/14 2105 05/18/14 2358 05/19/14 1200 05/20/14 0700 05/21/14 0500  NA 137 136* 142 146 148* 145  K 7.1* 7.2* 5.8* 5.6* 4.1 4.1  CL 103 102 107 107 107 106  CO2 GLUCOSE 87 76 88 92 79 81  BUN CREATININE 0.58 0.62 0.60 0.58 0.55 0.57  CALCIUM 9.0 9.0 9.0 8.8 8.5 8.2*  MG  --   --   --  1.2*  --   --     CBC:  Recent Labs Lab 05/18/14 1000 05/20/14 0700  WBC  --  9.6  HGB 8.4* 7.9*  HCT 26.3* 24.0*  MCV  --  94.9  PLT  --  350    CBG:  Recent Labs Lab 05/18/14 0656  GLUCAP 87    Brief HPI:   [name] is a 53 y.o. 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 She treated for SIRS, dehydration and was found to have acute bilateral PE. She was started on eliquis and placed on liquid diet. Dr Rhea Belton recommended MRI/MRCP of pancreas for work up but patient refused MRI study X 2 despite sedation and GI has signed off. SIRS resolved--unclear source. Electrolyte abnormality due to malnutrition being supplemented and insulin discontinued due to hypoglycemic episodes as well as  Hgb A1c-4.9.  Therapy initiated and patient was noted to be deconditioned. CIR recommended  for follow up therapy.     Hospital Course: [name] was admitted to rehab 05/14/2014 for inpatient therapies to consist of PT, ST and OT at least three hours five days a week. Past admission physiatrist, therapy team and rehab RN have worked together to provide customized collaborative inpatient rehab. She continued to have complaints of pain requiring dilaudid in addition to hydrocodone. Her GI symptoms have resolved with improvement in po intake and she has been weaned off dilaudid by discharge. Low dose xanax was used on prn basis to help with high levels of anxiety. Her magnesium levels have been supplemented with improvement to 1.2.  Hypokalemia was supplemented with resultant hyperkalemia. Supplements were discontinued and she was treated with kayexalate. Follow up labs show resolution of hypernatremia as well as hyperkalemia. She continues to have severe protein malnutrition with albumin levels at 1.5 to 1.6 range and corrected calcium levels at 10.1.  She was started on nutritional supplements and advised to continue this past discharge.   Dr. Ewing Schlein was consulted for input due to downward trend in H/H with heme positive stools in setting of anticoagulation use. She underwent endoscopy revealing questionable mild gastritis versus gastropathy. No signs of bleeding or lesions noted. She is to follow up in office for colonoscopy in 3 months as recent CT scan was okay.  TSH levels are elevated at 5.560 and she was started on supplement with recommendations for follow up levels in a month. She has developed peripheral edema with increase in mobility and was treated with short course of diuretics. She is to continue compressive stockings for edema control past discharge. She has made good progress and is modified independent at discharge. She will continue to receive HHPT, HHOT, SW and [facility] for follow up by [facility].    Rehab course: During patient's stay in rehab weekly team conferences were  held to monitor patient's progress, set goals and discuss barriers to discharge. Patient has had improvement in activity tolerance, balance, postural control, as well as ability to compensate for deficits. She is modified independent for bathing and dressing tasks. She is modified independent for transfers and is able to ambulate 100 feet X 4 with RW in supervised setting.    Disposition: 01-Home or Self Care  Diet:  Diabetic diet.   Special Instructions: 1. Do not use Lisinopril or Amaryl. Follow current medication list and follow up with your MD for further changes.   2. Continue to use lasix 2-3 times a week as at home    Medication List    STOP taking these medications       HYDROmorphone 8 MG tablet  Commonly known as:  DILAUDID      TAKE these medications       ALPRAZolam 0.5 MG tablet  Commonly known as:  XANAX  Take 1 tablet (0.5 mg total) by mouth 2 (two) times daily.     amitriptyline 50 MG tablet  Commonly known as:  ELAVIL  Take 1 tablet (50 mg total) by mouth at bedtime as needed for sleep.     apixaban 5 MG Tabs tablet  Commonly known as:  ELIQUIS  Take 1 tablet (5 mg total) by mouth 2 (two) times daily.     calcium carbonate 500 MG chewable tablet  Commonly known as:  TUMS - dosed in mg elemental calcium  Chew 2 tablets (400 mg of elemental calcium total) by mouth 2 (two) times daily.     cetirizine 10 MG tablet  Commonly known as:  ZYRTEC  Take 10 mg by mouth every morning.     folic acid 1 MG tablet  Commonly known as:  FOLVITE  Take 1 tablet (1 mg total) by mouth daily.     gabapentin 400 MG capsule  Commonly known as:  NEURONTIN  Take 1 capsule (400 mg total) by mouth 2 (two) times daily.     gabapentin 600 MG tablet  Commonly known as:  NEURONTIN  Take 1 tablet (600 mg total) by mouth at bedtime.     HYDROcodone-acetaminophen 10-325 MG per tablet  Commonly known as:  NORCO  Take 1 tablet by mouth every 6 (six) hours as needed for moderate  pain.     levothyroxine 25 MCG tablet  Commonly known as:  SYNTHROID, LEVOTHROID  Take 1 tablet (25 mcg total) by mouth daily before breakfast.     loperamide 2 MG capsule  Commonly known as:  IMODIUM  Take 1 capsule (2 mg total) by mouth 3 (three) times daily as needed for diarrhea or loose stools.     magnesium oxide 400 (241.3 MG) MG tablet  Commonly known as:  MAG-OX  Take 1 tablet (400 mg total) by mouth 2 (two) times daily.     ondansetron 4 MG tablet  Commonly known as:  ZOFRAN  Take 1  tablet (4 mg total) by mouth every 8 (eight) hours as needed for nausea.     pantoprazole 20 MG tablet  Commonly known as:  PROTONIX  Take 2 tablets (40 mg total) by mouth daily.     simvastatin 40 MG tablet  Commonly known as:  ZOCOR  Take 40 mg by mouth at bedtime.     sulfaSALAzine 500 MG tablet  Commonly known as:  AZULFIDINE  Take 1,000 mg by mouth daily.     thiamine 100 MG tablet  Take 1 tablet (100 mg total) by mouth daily.     tiotropium 18 MCG inhalation capsule  Commonly known as:  SPIRIVA  Place 18 mcg into inhaler and inhale daily.     traZODone 50 MG tablet  Commonly known as:  DESYREL  Take 1 tablet (50 mg total) by mouth daily.     venlafaxine 25 MG tablet  Commonly known as:  EFFEXOR  Take 25 mg by mouth 2 (two) times daily.       Follow-up Information   Call Erick Colace, MD. (As needed)    Specialty:  Physical Medicine and Rehabilitation   Contact information:   [address] [place] [address] [place] [address] [phone]       Follow up with [facility], MD. Call in 2 days. (for follow up appointment in a month)    Specialty:  Gastroenterology   Contact information:   [address], [address] [place] [address] [phone]       Follow up with Jackie Plum, MD. Call in 2 days. (for post hospital follow up in 7-10 days)    Specialty:  Internal Medicine   Contact information:   [address] [place]  [address] [phone]       Signed: Jacquelynn Cree 05/24/2014, 11:37 AM

## 2014-05-26 DIAGNOSIS — R5381 Other malaise: Secondary | ICD-10-CM | POA: Diagnosis not present

## 2014-05-26 DIAGNOSIS — I2699 Other pulmonary embolism without acute cor pulmonale: Secondary | ICD-10-CM | POA: Diagnosis not present

## 2014-05-26 DIAGNOSIS — K861 Other chronic pancreatitis: Secondary | ICD-10-CM | POA: Diagnosis not present

## 2014-05-26 DIAGNOSIS — Z7901 Long term (current) use of anticoagulants: Secondary | ICD-10-CM | POA: Diagnosis not present

## 2014-05-26 DIAGNOSIS — A419 Sepsis, unspecified organism: Secondary | ICD-10-CM | POA: Diagnosis not present

## 2014-05-26 DIAGNOSIS — E46 Unspecified protein-calorie malnutrition: Secondary | ICD-10-CM | POA: Diagnosis not present

## 2014-05-27 DIAGNOSIS — Z7901 Long term (current) use of anticoagulants: Secondary | ICD-10-CM | POA: Diagnosis not present

## 2014-05-27 DIAGNOSIS — R5381 Other malaise: Secondary | ICD-10-CM | POA: Diagnosis not present

## 2014-05-27 DIAGNOSIS — E46 Unspecified protein-calorie malnutrition: Secondary | ICD-10-CM | POA: Diagnosis not present

## 2014-05-27 DIAGNOSIS — K861 Other chronic pancreatitis: Secondary | ICD-10-CM | POA: Diagnosis not present

## 2014-05-27 DIAGNOSIS — I2699 Other pulmonary embolism without acute cor pulmonale: Secondary | ICD-10-CM | POA: Diagnosis not present

## 2014-05-27 DIAGNOSIS — A419 Sepsis, unspecified organism: Secondary | ICD-10-CM | POA: Diagnosis not present

## 2014-05-31 DIAGNOSIS — K861 Other chronic pancreatitis: Secondary | ICD-10-CM | POA: Diagnosis not present

## 2014-05-31 DIAGNOSIS — Z7901 Long term (current) use of anticoagulants: Secondary | ICD-10-CM | POA: Diagnosis not present

## 2014-05-31 DIAGNOSIS — I2699 Other pulmonary embolism without acute cor pulmonale: Secondary | ICD-10-CM | POA: Diagnosis not present

## 2014-05-31 DIAGNOSIS — R5381 Other malaise: Secondary | ICD-10-CM | POA: Diagnosis not present

## 2014-05-31 DIAGNOSIS — E46 Unspecified protein-calorie malnutrition: Secondary | ICD-10-CM | POA: Diagnosis not present

## 2014-05-31 DIAGNOSIS — A419 Sepsis, unspecified organism: Secondary | ICD-10-CM | POA: Diagnosis not present

## 2014-06-01 DIAGNOSIS — Z7901 Long term (current) use of anticoagulants: Secondary | ICD-10-CM | POA: Diagnosis not present

## 2014-06-01 DIAGNOSIS — R5381 Other malaise: Secondary | ICD-10-CM | POA: Diagnosis not present

## 2014-06-01 DIAGNOSIS — A419 Sepsis, unspecified organism: Secondary | ICD-10-CM | POA: Diagnosis not present

## 2014-06-01 DIAGNOSIS — E46 Unspecified protein-calorie malnutrition: Secondary | ICD-10-CM | POA: Diagnosis not present

## 2014-06-01 DIAGNOSIS — K861 Other chronic pancreatitis: Secondary | ICD-10-CM | POA: Diagnosis not present

## 2014-06-01 DIAGNOSIS — I2699 Other pulmonary embolism without acute cor pulmonale: Secondary | ICD-10-CM | POA: Diagnosis not present

## 2014-06-02 DIAGNOSIS — Z7901 Long term (current) use of anticoagulants: Secondary | ICD-10-CM | POA: Diagnosis not present

## 2014-06-02 DIAGNOSIS — R5381 Other malaise: Secondary | ICD-10-CM | POA: Diagnosis not present

## 2014-06-02 DIAGNOSIS — I2699 Other pulmonary embolism without acute cor pulmonale: Secondary | ICD-10-CM | POA: Diagnosis not present

## 2014-06-02 DIAGNOSIS — A419 Sepsis, unspecified organism: Secondary | ICD-10-CM | POA: Diagnosis not present

## 2014-06-02 DIAGNOSIS — E46 Unspecified protein-calorie malnutrition: Secondary | ICD-10-CM | POA: Diagnosis not present

## 2014-06-02 DIAGNOSIS — K861 Other chronic pancreatitis: Secondary | ICD-10-CM | POA: Diagnosis not present

## 2014-06-04 DIAGNOSIS — E46 Unspecified protein-calorie malnutrition: Secondary | ICD-10-CM

## 2014-06-04 DIAGNOSIS — A419 Sepsis, unspecified organism: Secondary | ICD-10-CM | POA: Diagnosis not present

## 2014-06-04 DIAGNOSIS — Z7901 Long term (current) use of anticoagulants: Secondary | ICD-10-CM | POA: Diagnosis not present

## 2014-06-04 DIAGNOSIS — I2699 Other pulmonary embolism without acute cor pulmonale: Secondary | ICD-10-CM

## 2014-06-04 DIAGNOSIS — K861 Other chronic pancreatitis: Secondary | ICD-10-CM | POA: Diagnosis not present

## 2014-06-04 DIAGNOSIS — R5381 Other malaise: Secondary | ICD-10-CM

## 2014-06-08 DIAGNOSIS — E46 Unspecified protein-calorie malnutrition: Secondary | ICD-10-CM | POA: Diagnosis not present

## 2014-06-08 DIAGNOSIS — I2699 Other pulmonary embolism without acute cor pulmonale: Secondary | ICD-10-CM | POA: Diagnosis not present

## 2014-06-08 DIAGNOSIS — K861 Other chronic pancreatitis: Secondary | ICD-10-CM | POA: Diagnosis not present

## 2014-06-08 DIAGNOSIS — Z7901 Long term (current) use of anticoagulants: Secondary | ICD-10-CM | POA: Diagnosis not present

## 2014-06-08 DIAGNOSIS — R5381 Other malaise: Secondary | ICD-10-CM | POA: Diagnosis not present

## 2014-06-08 DIAGNOSIS — A419 Sepsis, unspecified organism: Secondary | ICD-10-CM | POA: Diagnosis not present

## 2014-06-09 DIAGNOSIS — Z7901 Long term (current) use of anticoagulants: Secondary | ICD-10-CM | POA: Diagnosis not present

## 2014-06-09 DIAGNOSIS — I2699 Other pulmonary embolism without acute cor pulmonale: Secondary | ICD-10-CM | POA: Diagnosis not present

## 2014-06-09 DIAGNOSIS — E46 Unspecified protein-calorie malnutrition: Secondary | ICD-10-CM | POA: Diagnosis not present

## 2014-06-09 DIAGNOSIS — A419 Sepsis, unspecified organism: Secondary | ICD-10-CM | POA: Diagnosis not present

## 2014-06-09 DIAGNOSIS — R5381 Other malaise: Secondary | ICD-10-CM | POA: Diagnosis not present

## 2014-06-09 DIAGNOSIS — K861 Other chronic pancreatitis: Secondary | ICD-10-CM | POA: Diagnosis not present

## 2014-06-10 DIAGNOSIS — I10 Essential (primary) hypertension: Secondary | ICD-10-CM | POA: Diagnosis not present

## 2014-06-10 DIAGNOSIS — E1142 Type 2 diabetes mellitus with diabetic polyneuropathy: Secondary | ICD-10-CM | POA: Diagnosis not present

## 2014-06-10 DIAGNOSIS — E119 Type 2 diabetes mellitus without complications: Secondary | ICD-10-CM | POA: Diagnosis not present

## 2014-06-10 DIAGNOSIS — F17200 Nicotine dependence, unspecified, uncomplicated: Secondary | ICD-10-CM | POA: Diagnosis not present

## 2014-06-10 DIAGNOSIS — G894 Chronic pain syndrome: Secondary | ICD-10-CM | POA: Diagnosis not present

## 2014-06-10 DIAGNOSIS — K5090 Crohn's disease, unspecified, without complications: Secondary | ICD-10-CM | POA: Diagnosis not present

## 2014-06-10 DIAGNOSIS — E559 Vitamin D deficiency, unspecified: Secondary | ICD-10-CM | POA: Diagnosis not present

## 2014-06-10 DIAGNOSIS — F341 Dysthymic disorder: Secondary | ICD-10-CM | POA: Diagnosis not present

## 2014-06-15 DIAGNOSIS — K861 Other chronic pancreatitis: Secondary | ICD-10-CM | POA: Diagnosis not present

## 2014-06-15 DIAGNOSIS — I2699 Other pulmonary embolism without acute cor pulmonale: Secondary | ICD-10-CM | POA: Diagnosis not present

## 2014-06-15 DIAGNOSIS — A419 Sepsis, unspecified organism: Secondary | ICD-10-CM | POA: Diagnosis not present

## 2014-06-15 DIAGNOSIS — R5381 Other malaise: Secondary | ICD-10-CM | POA: Diagnosis not present

## 2014-06-15 DIAGNOSIS — Z7901 Long term (current) use of anticoagulants: Secondary | ICD-10-CM | POA: Diagnosis not present

## 2014-06-15 DIAGNOSIS — E46 Unspecified protein-calorie malnutrition: Secondary | ICD-10-CM | POA: Diagnosis not present

## 2014-06-21 DIAGNOSIS — K861 Other chronic pancreatitis: Secondary | ICD-10-CM | POA: Diagnosis not present

## 2014-06-21 DIAGNOSIS — R5381 Other malaise: Secondary | ICD-10-CM | POA: Diagnosis not present

## 2014-06-21 DIAGNOSIS — Z7901 Long term (current) use of anticoagulants: Secondary | ICD-10-CM | POA: Diagnosis not present

## 2014-06-21 DIAGNOSIS — A419 Sepsis, unspecified organism: Secondary | ICD-10-CM | POA: Diagnosis not present

## 2014-06-21 DIAGNOSIS — E46 Unspecified protein-calorie malnutrition: Secondary | ICD-10-CM | POA: Diagnosis not present

## 2014-06-21 DIAGNOSIS — I2699 Other pulmonary embolism without acute cor pulmonale: Secondary | ICD-10-CM | POA: Diagnosis not present

## 2014-06-24 DIAGNOSIS — R5381 Other malaise: Secondary | ICD-10-CM | POA: Diagnosis not present

## 2014-06-24 DIAGNOSIS — Z7901 Long term (current) use of anticoagulants: Secondary | ICD-10-CM | POA: Diagnosis not present

## 2014-06-24 DIAGNOSIS — K861 Other chronic pancreatitis: Secondary | ICD-10-CM | POA: Diagnosis not present

## 2014-06-24 DIAGNOSIS — E46 Unspecified protein-calorie malnutrition: Secondary | ICD-10-CM | POA: Diagnosis not present

## 2014-06-24 DIAGNOSIS — A419 Sepsis, unspecified organism: Secondary | ICD-10-CM | POA: Diagnosis not present

## 2014-06-24 DIAGNOSIS — I2699 Other pulmonary embolism without acute cor pulmonale: Secondary | ICD-10-CM | POA: Diagnosis not present

## 2014-06-28 DIAGNOSIS — D500 Iron deficiency anemia secondary to blood loss (chronic): Secondary | ICD-10-CM | POA: Diagnosis not present

## 2014-06-28 DIAGNOSIS — K921 Melena: Secondary | ICD-10-CM | POA: Diagnosis not present

## 2014-06-28 DIAGNOSIS — K5090 Crohn's disease, unspecified, without complications: Secondary | ICD-10-CM | POA: Diagnosis not present

## 2014-06-28 DIAGNOSIS — A09 Infectious gastroenteritis and colitis, unspecified: Secondary | ICD-10-CM | POA: Diagnosis not present

## 2014-06-28 DIAGNOSIS — R112 Nausea with vomiting, unspecified: Secondary | ICD-10-CM | POA: Diagnosis not present

## 2014-06-29 DIAGNOSIS — Z7901 Long term (current) use of anticoagulants: Secondary | ICD-10-CM | POA: Diagnosis not present

## 2014-06-29 DIAGNOSIS — K861 Other chronic pancreatitis: Secondary | ICD-10-CM | POA: Diagnosis not present

## 2014-06-29 DIAGNOSIS — I2699 Other pulmonary embolism without acute cor pulmonale: Secondary | ICD-10-CM | POA: Diagnosis not present

## 2014-06-29 DIAGNOSIS — E46 Unspecified protein-calorie malnutrition: Secondary | ICD-10-CM | POA: Diagnosis not present

## 2014-06-29 DIAGNOSIS — R5381 Other malaise: Secondary | ICD-10-CM | POA: Diagnosis not present

## 2014-06-29 DIAGNOSIS — A419 Sepsis, unspecified organism: Secondary | ICD-10-CM | POA: Diagnosis not present

## 2014-07-01 DIAGNOSIS — E46 Unspecified protein-calorie malnutrition: Secondary | ICD-10-CM | POA: Diagnosis not present

## 2014-07-01 DIAGNOSIS — A419 Sepsis, unspecified organism: Secondary | ICD-10-CM | POA: Diagnosis not present

## 2014-07-01 DIAGNOSIS — R5381 Other malaise: Secondary | ICD-10-CM | POA: Diagnosis not present

## 2014-07-01 DIAGNOSIS — K861 Other chronic pancreatitis: Secondary | ICD-10-CM | POA: Diagnosis not present

## 2014-07-01 DIAGNOSIS — I2699 Other pulmonary embolism without acute cor pulmonale: Secondary | ICD-10-CM | POA: Diagnosis not present

## 2014-07-01 DIAGNOSIS — Z7901 Long term (current) use of anticoagulants: Secondary | ICD-10-CM | POA: Diagnosis not present

## 2014-07-05 ENCOUNTER — Telehealth: Payer: Self-pay | Admitting: *Deleted

## 2014-07-05 NOTE — Telephone Encounter (Signed)
Trey Paula PT from [facility] called to extend PT visits 2wk3.  Verbal approval given.

## 2014-07-06 DIAGNOSIS — I2699 Other pulmonary embolism without acute cor pulmonale: Secondary | ICD-10-CM | POA: Diagnosis not present

## 2014-07-06 DIAGNOSIS — E46 Unspecified protein-calorie malnutrition: Secondary | ICD-10-CM | POA: Diagnosis not present

## 2014-07-06 DIAGNOSIS — A419 Sepsis, unspecified organism: Secondary | ICD-10-CM | POA: Diagnosis not present

## 2014-07-06 DIAGNOSIS — K861 Other chronic pancreatitis: Secondary | ICD-10-CM | POA: Diagnosis not present

## 2014-07-06 DIAGNOSIS — Z7901 Long term (current) use of anticoagulants: Secondary | ICD-10-CM | POA: Diagnosis not present

## 2014-07-06 DIAGNOSIS — R5381 Other malaise: Secondary | ICD-10-CM | POA: Diagnosis not present

## 2014-07-13 DIAGNOSIS — I2699 Other pulmonary embolism without acute cor pulmonale: Secondary | ICD-10-CM | POA: Diagnosis not present

## 2014-07-13 DIAGNOSIS — A419 Sepsis, unspecified organism: Secondary | ICD-10-CM | POA: Diagnosis not present

## 2014-07-13 DIAGNOSIS — R5381 Other malaise: Secondary | ICD-10-CM | POA: Diagnosis not present

## 2014-07-13 DIAGNOSIS — E46 Unspecified protein-calorie malnutrition: Secondary | ICD-10-CM | POA: Diagnosis not present

## 2014-07-13 DIAGNOSIS — Z7901 Long term (current) use of anticoagulants: Secondary | ICD-10-CM | POA: Diagnosis not present

## 2014-07-13 DIAGNOSIS — K861 Other chronic pancreatitis: Secondary | ICD-10-CM | POA: Diagnosis not present

## 2014-07-16 ENCOUNTER — Other Ambulatory Visit: Payer: Self-pay

## 2014-07-19 DIAGNOSIS — I2699 Other pulmonary embolism without acute cor pulmonale: Secondary | ICD-10-CM | POA: Diagnosis not present

## 2014-07-19 DIAGNOSIS — R5381 Other malaise: Secondary | ICD-10-CM | POA: Diagnosis not present

## 2014-07-19 DIAGNOSIS — K861 Other chronic pancreatitis: Secondary | ICD-10-CM | POA: Diagnosis not present

## 2014-07-19 DIAGNOSIS — A419 Sepsis, unspecified organism: Secondary | ICD-10-CM | POA: Diagnosis not present

## 2014-07-19 DIAGNOSIS — Z7901 Long term (current) use of anticoagulants: Secondary | ICD-10-CM | POA: Diagnosis not present

## 2014-07-19 DIAGNOSIS — E46 Unspecified protein-calorie malnutrition: Secondary | ICD-10-CM | POA: Diagnosis not present

## 2014-07-20 DIAGNOSIS — I2699 Other pulmonary embolism without acute cor pulmonale: Secondary | ICD-10-CM | POA: Diagnosis not present

## 2014-07-20 DIAGNOSIS — A419 Sepsis, unspecified organism: Secondary | ICD-10-CM | POA: Diagnosis not present

## 2014-07-20 DIAGNOSIS — E46 Unspecified protein-calorie malnutrition: Secondary | ICD-10-CM | POA: Diagnosis not present

## 2014-07-20 DIAGNOSIS — K861 Other chronic pancreatitis: Secondary | ICD-10-CM | POA: Diagnosis not present

## 2014-07-20 DIAGNOSIS — Z7901 Long term (current) use of anticoagulants: Secondary | ICD-10-CM | POA: Diagnosis not present

## 2014-07-20 DIAGNOSIS — R5381 Other malaise: Secondary | ICD-10-CM | POA: Diagnosis not present

## 2014-07-22 DIAGNOSIS — R5381 Other malaise: Secondary | ICD-10-CM | POA: Diagnosis not present

## 2014-07-22 DIAGNOSIS — E46 Unspecified protein-calorie malnutrition: Secondary | ICD-10-CM | POA: Diagnosis not present

## 2014-07-22 DIAGNOSIS — A419 Sepsis, unspecified organism: Secondary | ICD-10-CM | POA: Diagnosis not present

## 2014-07-22 DIAGNOSIS — K861 Other chronic pancreatitis: Secondary | ICD-10-CM | POA: Diagnosis not present

## 2014-07-22 DIAGNOSIS — Z7901 Long term (current) use of anticoagulants: Secondary | ICD-10-CM | POA: Diagnosis not present

## 2014-07-22 DIAGNOSIS — I2699 Other pulmonary embolism without acute cor pulmonale: Secondary | ICD-10-CM | POA: Diagnosis not present

## 2014-07-23 DIAGNOSIS — Z7189 Other specified counseling: Secondary | ICD-10-CM | POA: Diagnosis not present

## 2014-07-23 DIAGNOSIS — E039 Hypothyroidism, unspecified: Secondary | ICD-10-CM | POA: Diagnosis not present

## 2014-07-23 DIAGNOSIS — R7989 Other specified abnormal findings of blood chemistry: Secondary | ICD-10-CM | POA: Diagnosis not present

## 2014-07-23 DIAGNOSIS — G4700 Insomnia, unspecified: Secondary | ICD-10-CM | POA: Diagnosis not present

## 2014-07-23 DIAGNOSIS — R634 Abnormal weight loss: Secondary | ICD-10-CM | POA: Diagnosis not present

## 2014-07-23 DIAGNOSIS — I10 Essential (primary) hypertension: Secondary | ICD-10-CM | POA: Diagnosis not present

## 2014-07-23 DIAGNOSIS — E1140 Type 2 diabetes mellitus with diabetic neuropathy, unspecified: Secondary | ICD-10-CM | POA: Diagnosis not present

## 2014-07-23 DIAGNOSIS — G894 Chronic pain syndrome: Secondary | ICD-10-CM | POA: Diagnosis not present

## 2014-07-23 DIAGNOSIS — E876 Hypokalemia: Secondary | ICD-10-CM | POA: Diagnosis not present

## 2014-07-23 DIAGNOSIS — D649 Anemia, unspecified: Secondary | ICD-10-CM | POA: Diagnosis not present

## 2014-07-23 DIAGNOSIS — G252 Other specified forms of tremor: Secondary | ICD-10-CM | POA: Diagnosis not present

## 2014-07-23 DIAGNOSIS — E1142 Type 2 diabetes mellitus with diabetic polyneuropathy: Secondary | ICD-10-CM | POA: Diagnosis not present

## 2014-07-23 DIAGNOSIS — E785 Hyperlipidemia, unspecified: Secondary | ICD-10-CM | POA: Diagnosis not present

## 2014-07-23 DIAGNOSIS — K5090 Crohn's disease, unspecified, without complications: Secondary | ICD-10-CM | POA: Diagnosis not present

## 2014-07-26 ENCOUNTER — Emergency Department (HOSPITAL_COMMUNITY): Payer: Medicare Other

## 2014-07-26 ENCOUNTER — Inpatient Hospital Stay (HOSPITAL_COMMUNITY): Payer: Medicare Other

## 2014-07-26 ENCOUNTER — Inpatient Hospital Stay (HOSPITAL_COMMUNITY)
Admission: EM | Admit: 2014-07-26 | Discharge: 2014-08-02 | DRG: 871 | Disposition: A | Discharge status: Home Health | Payer: Medicare Other | Attending: Internal Medicine | Admitting: Internal Medicine

## 2014-07-26 ENCOUNTER — Encounter (HOSPITAL_COMMUNITY): Payer: Self-pay | Admitting: Emergency Medicine

## 2014-07-26 DIAGNOSIS — D649 Anemia, unspecified: Secondary | ICD-10-CM | POA: Diagnosis present

## 2014-07-26 DIAGNOSIS — R0600 Dyspnea, unspecified: Secondary | ICD-10-CM | POA: Diagnosis not present

## 2014-07-26 DIAGNOSIS — F329 Major depressive disorder, single episode, unspecified: Secondary | ICD-10-CM | POA: Diagnosis present

## 2014-07-26 DIAGNOSIS — R112 Nausea with vomiting, unspecified: Secondary | ICD-10-CM | POA: Diagnosis present

## 2014-07-26 DIAGNOSIS — Z79899 Other long term (current) drug therapy: Secondary | ICD-10-CM

## 2014-07-26 DIAGNOSIS — B1920 Unspecified viral hepatitis C without hepatic coma: Secondary | ICD-10-CM | POA: Diagnosis present

## 2014-07-26 DIAGNOSIS — I2699 Other pulmonary embolism without acute cor pulmonale: Secondary | ICD-10-CM | POA: Diagnosis not present

## 2014-07-26 DIAGNOSIS — R1031 Right lower quadrant pain: Secondary | ICD-10-CM | POA: Diagnosis not present

## 2014-07-26 DIAGNOSIS — R64 Cachexia: Secondary | ICD-10-CM | POA: Diagnosis present

## 2014-07-26 DIAGNOSIS — Z720 Tobacco use: Secondary | ICD-10-CM | POA: Diagnosis present

## 2014-07-26 DIAGNOSIS — K5090 Crohn's disease, unspecified, without complications: Secondary | ICD-10-CM | POA: Diagnosis present

## 2014-07-26 DIAGNOSIS — G250 Essential tremor: Secondary | ICD-10-CM | POA: Diagnosis present

## 2014-07-26 DIAGNOSIS — J189 Pneumonia, unspecified organism: Secondary | ICD-10-CM | POA: Diagnosis not present

## 2014-07-26 DIAGNOSIS — Z7901 Long term (current) use of anticoagulants: Secondary | ICD-10-CM | POA: Diagnosis not present

## 2014-07-26 DIAGNOSIS — R197 Diarrhea, unspecified: Secondary | ICD-10-CM | POA: Diagnosis not present

## 2014-07-26 DIAGNOSIS — K869 Disease of pancreas, unspecified: Secondary | ICD-10-CM

## 2014-07-26 DIAGNOSIS — R079 Chest pain, unspecified: Secondary | ICD-10-CM | POA: Diagnosis not present

## 2014-07-26 DIAGNOSIS — R0789 Other chest pain: Secondary | ICD-10-CM | POA: Diagnosis not present

## 2014-07-26 DIAGNOSIS — R51 Headache: Secondary | ICD-10-CM | POA: Diagnosis not present

## 2014-07-26 DIAGNOSIS — R933 Abnormal findings on diagnostic imaging of other parts of digestive tract: Secondary | ICD-10-CM | POA: Diagnosis not present

## 2014-07-26 DIAGNOSIS — E876 Hypokalemia: Secondary | ICD-10-CM | POA: Diagnosis not present

## 2014-07-26 DIAGNOSIS — R0602 Shortness of breath: Secondary | ICD-10-CM | POA: Diagnosis not present

## 2014-07-26 DIAGNOSIS — K838 Other specified diseases of biliary tract: Secondary | ICD-10-CM | POA: Diagnosis not present

## 2014-07-26 DIAGNOSIS — R069 Unspecified abnormalities of breathing: Secondary | ICD-10-CM | POA: Diagnosis not present

## 2014-07-26 DIAGNOSIS — Z9181 History of falling: Secondary | ICD-10-CM | POA: Diagnosis not present

## 2014-07-26 DIAGNOSIS — F17210 Nicotine dependence, cigarettes, uncomplicated: Secondary | ICD-10-CM | POA: Diagnosis present

## 2014-07-26 DIAGNOSIS — I959 Hypotension, unspecified: Secondary | ICD-10-CM | POA: Diagnosis present

## 2014-07-26 DIAGNOSIS — R1032 Left lower quadrant pain: Secondary | ICD-10-CM

## 2014-07-26 DIAGNOSIS — K8689 Other specified diseases of pancreas: Secondary | ICD-10-CM

## 2014-07-26 DIAGNOSIS — R627 Adult failure to thrive: Secondary | ICD-10-CM | POA: Diagnosis not present

## 2014-07-26 DIAGNOSIS — J9600 Acute respiratory failure, unspecified whether with hypoxia or hypercapnia: Secondary | ICD-10-CM | POA: Diagnosis not present

## 2014-07-26 DIAGNOSIS — W19XXXA Unspecified fall, initial encounter: Secondary | ICD-10-CM

## 2014-07-26 DIAGNOSIS — R071 Chest pain on breathing: Secondary | ICD-10-CM | POA: Diagnosis not present

## 2014-07-26 DIAGNOSIS — S0990XA Unspecified injury of head, initial encounter: Secondary | ICD-10-CM

## 2014-07-26 DIAGNOSIS — D509 Iron deficiency anemia, unspecified: Secondary | ICD-10-CM | POA: Diagnosis present

## 2014-07-26 DIAGNOSIS — J45901 Unspecified asthma with (acute) exacerbation: Secondary | ICD-10-CM

## 2014-07-26 DIAGNOSIS — Z86711 Personal history of pulmonary embolism: Secondary | ICD-10-CM | POA: Diagnosis present

## 2014-07-26 DIAGNOSIS — I10 Essential (primary) hypertension: Secondary | ICD-10-CM | POA: Diagnosis present

## 2014-07-26 DIAGNOSIS — E119 Type 2 diabetes mellitus without complications: Secondary | ICD-10-CM | POA: Diagnosis present

## 2014-07-26 DIAGNOSIS — R5381 Other malaise: Secondary | ICD-10-CM | POA: Diagnosis present

## 2014-07-26 DIAGNOSIS — Z681 Body mass index (BMI) 19 or less, adult: Secondary | ICD-10-CM | POA: Diagnosis not present

## 2014-07-26 DIAGNOSIS — E43 Unspecified severe protein-calorie malnutrition: Secondary | ICD-10-CM | POA: Diagnosis present

## 2014-07-26 DIAGNOSIS — J209 Acute bronchitis, unspecified: Secondary | ICD-10-CM | POA: Diagnosis present

## 2014-07-26 DIAGNOSIS — R945 Abnormal results of liver function studies: Secondary | ICD-10-CM | POA: Diagnosis not present

## 2014-07-26 DIAGNOSIS — K868 Other specified diseases of pancreas: Secondary | ICD-10-CM | POA: Diagnosis not present

## 2014-07-26 DIAGNOSIS — F419 Anxiety disorder, unspecified: Secondary | ICD-10-CM | POA: Diagnosis present

## 2014-07-26 DIAGNOSIS — A419 Sepsis, unspecified organism: Secondary | ICD-10-CM

## 2014-07-26 DIAGNOSIS — Z8679 Personal history of other diseases of the circulatory system: Secondary | ICD-10-CM | POA: Diagnosis not present

## 2014-07-26 DIAGNOSIS — G629 Polyneuropathy, unspecified: Secondary | ICD-10-CM | POA: Diagnosis present

## 2014-07-26 DIAGNOSIS — R6510 Systemic inflammatory response syndrome (SIRS) of non-infectious origin without acute organ dysfunction: Secondary | ICD-10-CM | POA: Diagnosis present

## 2014-07-26 DIAGNOSIS — E860 Dehydration: Secondary | ICD-10-CM | POA: Diagnosis not present

## 2014-07-26 DIAGNOSIS — R109 Unspecified abdominal pain: Secondary | ICD-10-CM | POA: Diagnosis not present

## 2014-07-26 DIAGNOSIS — Z8719 Personal history of other diseases of the digestive system: Secondary | ICD-10-CM | POA: Diagnosis not present

## 2014-07-26 DIAGNOSIS — R740 Nonspecific elevation of levels of transaminase and lactic acid dehydrogenase [LDH]: Secondary | ICD-10-CM | POA: Diagnosis not present

## 2014-07-26 DIAGNOSIS — K7689 Other specified diseases of liver: Secondary | ICD-10-CM | POA: Diagnosis not present

## 2014-07-26 DIAGNOSIS — R1084 Generalized abdominal pain: Secondary | ICD-10-CM | POA: Diagnosis not present

## 2014-07-26 HISTORY — DX: Other pulmonary embolism without acute cor pulmonale: I26.99

## 2014-07-26 LAB — COMPREHENSIVE METABOLIC PANEL
ALT: 24 U/L (ref 0–35)
AST: 32 U/L (ref 0–37)
Albumin: 2.2 g/dL — ABNORMAL LOW (ref 3.5–5.2)
Alkaline Phosphatase: 155 U/L — ABNORMAL HIGH (ref 39–117)
Anion gap: 15 (ref 5–15)
BUN: 10 mg/dL (ref 6–23)
CALCIUM: 7.4 mg/dL — AB (ref 8.4–10.5)
CO2: 20 meq/L (ref 19–32)
Chloride: 106 mEq/L (ref 96–112)
Creatinine, Ser: 0.8 mg/dL (ref 0.50–1.10)
GFR, EST NON AFRICAN AMERICAN: 81 mL/min — AB (ref >90)
GLUCOSE: 144 mg/dL — AB (ref 70–99)
Potassium: 3.7 mEq/L (ref 3.7–5.3)
Sodium: 141 mEq/L (ref 137–147)
Total Bilirubin: <0.2 mg/dL — ABNORMAL LOW (ref 0.3–1.2)
Total Protein: 5.3 g/dL — ABNORMAL LOW (ref 6.0–8.3)

## 2014-07-26 LAB — PREGNANCY, URINE: PREG TEST UR: NEGATIVE

## 2014-07-26 LAB — CBC
HCT: 26.7 % — ABNORMAL LOW (ref 36.0–46.0)
Hemoglobin: 8.7 g/dL — ABNORMAL LOW (ref 12.0–15.0)
MCH: 28.4 pg (ref 26.0–34.0)
MCHC: 32.6 g/dL (ref 30.0–36.0)
MCV: 87.3 fL (ref 78.0–100.0)
Platelets: 384 10*3/uL (ref 150–400)
RBC: 3.06 MIL/uL — AB (ref 3.87–5.11)
RDW: 14.9 % (ref 11.5–15.5)
WBC: 8.8 10*3/uL (ref 4.0–10.5)

## 2014-07-26 LAB — URINALYSIS, ROUTINE W REFLEX MICROSCOPIC
BILIRUBIN URINE: NEGATIVE
GLUCOSE, UA: NEGATIVE mg/dL
Hgb urine dipstick: NEGATIVE
KETONES UR: 15 mg/dL — AB
Leukocytes, UA: NEGATIVE
Nitrite: NEGATIVE
PH: 6 (ref 5.0–8.0)
Protein, ur: NEGATIVE mg/dL
Specific Gravity, Urine: 1.013 (ref 1.005–1.030)
Urobilinogen, UA: 0.2 mg/dL (ref 0.0–1.0)

## 2014-07-26 LAB — INFLUENZA PANEL BY PCR (TYPE A & B)
H1N1 flu by pcr: NOT DETECTED
INFLBPCR: NEGATIVE
Influenza A By PCR: NEGATIVE

## 2014-07-26 LAB — GLUCOSE, CAPILLARY
GLUCOSE-CAPILLARY: 103 mg/dL — AB (ref 70–99)
Glucose-Capillary: 81 mg/dL (ref 70–99)

## 2014-07-26 LAB — I-STAT CG4 LACTIC ACID, ED: Lactic Acid, Venous: 3.97 mmol/L — ABNORMAL HIGH (ref 0.5–2.2)

## 2014-07-26 LAB — PRO B NATRIURETIC PEPTIDE: Pro B Natriuretic peptide (BNP): 440.7 pg/mL — ABNORMAL HIGH (ref 0–125)

## 2014-07-26 LAB — I-STAT TROPONIN, ED: TROPONIN I, POC: 0.01 ng/mL (ref 0.00–0.08)

## 2014-07-26 LAB — TROPONIN I: Troponin I: <0.3 ng/mL (ref <0.30)

## 2014-07-26 LAB — CLOSTRIDIUM DIFFICILE BY PCR: Toxigenic C. Difficile by PCR: NEGATIVE

## 2014-07-26 MED ORDER — LEVOFLOXACIN IN D5W 750 MG/150ML IV SOLN
750.0000 mg | INTRAVENOUS | Status: DC
  Filled 2014-07-26: qty 150

## 2014-07-26 MED ORDER — SODIUM CHLORIDE 0.9 % IJ SOLN
3.0000 mL | Freq: Two times a day (BID) | INTRAMUSCULAR | Status: DC
  Administered 2014-07-27 – 2014-07-28 (×2): 3 mL via INTRAVENOUS
  Administered 2014-07-28: 22:00:00 via INTRAVENOUS
  Administered 2014-07-30 – 2014-07-31 (×3): 3 mL via INTRAVENOUS

## 2014-07-26 MED ORDER — POTASSIUM CHLORIDE IN NACL 40-0.9 MEQ/L-% IV SOLN
INTRAVENOUS | Status: AC
  Administered 2014-07-26: 125 mL/h via INTRAVENOUS
  Filled 2014-07-26 (×3): qty 1000

## 2014-07-26 MED ORDER — DEXTROSE 5 % IV SOLN
2.0000 g | Freq: Once | INTRAVENOUS | Status: AC
  Administered 2014-07-26: 2 g via INTRAVENOUS
  Filled 2014-07-26: qty 2

## 2014-07-26 MED ORDER — LEVOTHYROXINE SODIUM 25 MCG PO TABS
25.0000 ug | ORAL_TABLET | Freq: Every day | ORAL | Status: DC
  Administered 2014-07-27 – 2014-08-02 (×7): 25 ug via ORAL
  Filled 2014-07-26 (×10): qty 1

## 2014-07-26 MED ORDER — INSULIN ASPART 100 UNIT/ML ~~LOC~~ SOLN: 0.0000 [IU] | Freq: Three times a day (TID) | SUBCUTANEOUS | Status: DC

## 2014-07-26 MED ORDER — CALCIUM CARBONATE ANTACID 500 MG PO CHEW
2.0000 | CHEWABLE_TABLET | Freq: Two times a day (BID) | ORAL | Status: DC
  Administered 2014-07-26 – 2014-08-02 (×12): 400 mg via ORAL
  Filled 2014-07-26 (×15): qty 2

## 2014-07-26 MED ORDER — ACETAMINOPHEN 650 MG RE SUPP
650.0000 mg | Freq: Four times a day (QID) | RECTAL | Status: DC | PRN
Start: 2014-07-26 — End: 2014-08-02

## 2014-07-26 MED ORDER — ALBUTEROL SULFATE (2.5 MG/3ML) 0.083% IN NEBU: 2.5000 mg | INHALATION_SOLUTION | RESPIRATORY_TRACT | Status: DC | PRN

## 2014-07-26 MED ORDER — SUCRALFATE 1 G PO TABS
1.0000 g | ORAL_TABLET | Freq: Two times a day (BID) | ORAL | Status: DC
  Administered 2014-07-26 – 2014-08-02 (×12): 1 g via ORAL
  Filled 2014-07-26 (×19): qty 1

## 2014-07-26 MED ORDER — LEVOFLOXACIN IN D5W 750 MG/150ML IV SOLN
750.0000 mg | Freq: Once | INTRAVENOUS | Status: AC
  Administered 2014-07-26: 750 mg via INTRAVENOUS
  Filled 2014-07-26: qty 150

## 2014-07-26 MED ORDER — FENTANYL CITRATE 0.05 MG/ML IJ SOLN
50.0000 ug | Freq: Once | INTRAMUSCULAR | Status: AC
  Administered 2014-07-26: 50 ug via INTRAVENOUS
  Filled 2014-07-26: qty 2

## 2014-07-26 MED ORDER — VITAMIN B-1 100 MG PO TABS
100.0000 mg | ORAL_TABLET | Freq: Every day | ORAL | Status: DC
  Administered 2014-07-27 – 2014-08-02 (×6): 100 mg via ORAL
  Filled 2014-07-26 (×8): qty 1

## 2014-07-26 MED ORDER — ONDANSETRON HCL 4 MG/2ML IJ SOLN
4.0000 mg | Freq: Four times a day (QID) | INTRAMUSCULAR | Status: DC | PRN
  Administered 2014-07-27: 4 mg via INTRAVENOUS
  Filled 2014-07-26: qty 2

## 2014-07-26 MED ORDER — BOOST / RESOURCE BREEZE PO LIQD
1.0000 | Freq: Three times a day (TID) | ORAL | Status: DC
  Administered 2014-07-26 – 2014-08-02 (×17): 1 via ORAL
  Filled 2014-07-26 (×2): qty 1

## 2014-07-26 MED ORDER — PANTOPRAZOLE SODIUM 40 MG PO TBEC
40.0000 mg | DELAYED_RELEASE_TABLET | Freq: Every day | ORAL | Status: DC
  Administered 2014-07-27 – 2014-08-02 (×6): 40 mg via ORAL
  Filled 2014-07-26 (×8): qty 1

## 2014-07-26 MED ORDER — SULFASALAZINE 500 MG PO TABS
1000.0000 mg | ORAL_TABLET | Freq: Every day | ORAL | Status: DC
  Administered 2014-07-26 – 2014-08-01 (×5): 1000 mg via ORAL
  Filled 2014-07-26 (×10): qty 2

## 2014-07-26 MED ORDER — SULFASALAZINE 500 MG PO TABS
1000.0000 mg | ORAL_TABLET | Freq: Every day | ORAL | Status: DC
  Filled 2014-07-26: qty 2

## 2014-07-26 MED ORDER — APIXABAN 5 MG PO TABS
5.0000 mg | ORAL_TABLET | Freq: Two times a day (BID) | ORAL | Status: DC
  Administered 2014-07-26 – 2014-08-02 (×13): 5 mg via ORAL
  Filled 2014-07-26 (×17): qty 1

## 2014-07-26 MED ORDER — SODIUM CHLORIDE 0.9 % IV BOLUS (SEPSIS): 500.0000 mL | INTRAVENOUS | Status: DC

## 2014-07-26 MED ORDER — DEXTROSE 5 % IV SOLN
1.0000 g | Freq: Three times a day (TID) | INTRAVENOUS | Status: DC
  Administered 2014-07-26 – 2014-07-30 (×11): 1 g via INTRAVENOUS
  Filled 2014-07-26 (×15): qty 1

## 2014-07-26 MED ORDER — SODIUM CHLORIDE 0.9 % IV BOLUS (SEPSIS)
1000.0000 mL | Freq: Once | INTRAVENOUS | Status: AC
  Administered 2014-07-26: 1000 mL via INTRAVENOUS

## 2014-07-26 MED ORDER — ACETAMINOPHEN 325 MG PO TABS
650.0000 mg | ORAL_TABLET | Freq: Four times a day (QID) | ORAL | Status: DC | PRN
  Administered 2014-08-02: 650 mg via ORAL
  Filled 2014-07-26: qty 2

## 2014-07-26 MED ORDER — GABAPENTIN 400 MG PO CAPS
800.0000 mg | ORAL_CAPSULE | Freq: Three times a day (TID) | ORAL | Status: DC
  Administered 2014-07-26 – 2014-08-02 (×19): 800 mg via ORAL
  Filled 2014-07-26 (×26): qty 2

## 2014-07-26 MED ORDER — ROSUVASTATIN CALCIUM 20 MG PO TABS
20.0000 mg | ORAL_TABLET | Freq: Every day | ORAL | Status: DC
  Administered 2014-07-26 – 2014-08-01 (×7): 20 mg via ORAL
  Filled 2014-07-26 (×9): qty 1

## 2014-07-26 MED ORDER — VENLAFAXINE HCL 25 MG PO TABS
25.0000 mg | ORAL_TABLET | Freq: Two times a day (BID) | ORAL | Status: DC
  Administered 2014-07-26 – 2014-08-02 (×12): 25 mg via ORAL
  Filled 2014-07-26 (×15): qty 1

## 2014-07-26 MED ORDER — FOLIC ACID 1 MG PO TABS
1.0000 mg | ORAL_TABLET | Freq: Every day | ORAL | Status: DC
  Administered 2014-07-27 – 2014-08-02 (×6): 1 mg via ORAL
  Filled 2014-07-26 (×8): qty 1

## 2014-07-26 MED ORDER — ALPRAZOLAM 0.5 MG PO TABS
2.0000 mg | ORAL_TABLET | Freq: Three times a day (TID) | ORAL | Status: DC | PRN
  Administered 2014-07-26 – 2014-07-27 (×3): 2 mg via ORAL
  Filled 2014-07-26 (×3): qty 4

## 2014-07-26 MED ORDER — GABAPENTIN 800 MG PO TABS
800.0000 mg | ORAL_TABLET | Freq: Three times a day (TID) | ORAL | Status: DC
  Filled 2014-07-26 (×2): qty 1

## 2014-07-26 MED ORDER — VANCOMYCIN HCL 500 MG IV SOLR
500.0000 mg | Freq: Two times a day (BID) | INTRAVENOUS | Status: DC
  Administered 2014-07-27: 500 mg via INTRAVENOUS
  Filled 2014-07-26 (×2): qty 500

## 2014-07-26 MED ORDER — APIXABAN 5 MG PO TABS
5.0000 mg | ORAL_TABLET | Freq: Two times a day (BID) | ORAL | Status: DC
  Filled 2014-07-26: qty 1

## 2014-07-26 MED ORDER — ONDANSETRON HCL 4 MG PO TABS
4.0000 mg | ORAL_TABLET | Freq: Four times a day (QID) | ORAL | Status: DC | PRN
Start: 2014-07-26 — End: 2014-08-02
  Administered 2014-08-01: 4 mg via ORAL
  Filled 2014-07-26: qty 1

## 2014-07-26 MED ORDER — HYDROMORPHONE HCL 2 MG PO TABS
4.0000 mg | ORAL_TABLET | ORAL | Status: DC | PRN
Start: 2014-07-26 — End: 2014-07-30
  Administered 2014-07-26 – 2014-07-30 (×9): 4 mg via ORAL
  Filled 2014-07-26 (×11): qty 2

## 2014-07-26 MED ORDER — VANCOMYCIN HCL IN DEXTROSE 1-5 GM/200ML-% IV SOLN
1000.0000 mg | Freq: Once | INTRAVENOUS | Status: AC
  Administered 2014-07-26: 1000 mg via INTRAVENOUS
  Filled 2014-07-26: qty 200

## 2014-07-26 MED ORDER — LORATADINE 10 MG PO TABS
10.0000 mg | ORAL_TABLET | Freq: Every day | ORAL | Status: DC
  Administered 2014-07-27 – 2014-08-02 (×6): 10 mg via ORAL
  Filled 2014-07-26 (×8): qty 1

## 2014-07-26 NOTE — Progress Notes (Signed)
INITIAL NUTRITION ASSESSMENT  DOCUMENTATION CODES Per approved criteria  -Severe malnutrition in the context of chronic illness -Underweight   INTERVENTION:  Please obtain current weight Resource Breeze (Peach flavor) po TID, each supplement provides 250 kcal and 9 grams of protein RD to follow for nutrition care plan  NUTRITION DIAGNOSIS: Increased nutrient needs related to malnutrition, repletion as evidenced by estimated nutrition needs  Goal: Pt to meet >/= 90% of their estimated nutrition needs   Monitor:  PO & supplemental intake, weight, labs, I/O's  Reason for Assessment: Consult, Malnutrition Screening Tool Report  53 y.o. female  Admitting Dx: SIRS (systemic inflammatory response syndrome)  ASSESSMENT: 53 y.o. female with history of DM 2, HTN, Crohn's disease, hepatitis C, alcohol induced chronic pancreatitis, chronic abdominal pain, anxiety/depression, self-reported essential tremors, severe protein calorie malnutrition, hospitalized between 05/07/14-05/14/14 for SIRS, nausea, vomiting and diarrhea, acute bilateral PE, syncope & AKI followed by CIR admission 05/14/14-05/21/14 for deconditioning.    Presented to ED with complaints of cough, dyspnea, chest pain, nausea, vomiting and diarrhea.  Patient known to Clinical Nutrition during previous hospital admissions; dx of severe malnutrition ongoing; reports her appetite has been poor for the past 8 months; usually only consumes 1 meal per day; + nausea & vomiting; does not care for Ensure or Boost as it causes her gas; has had Resource Breeze in the past and would like during his hospitalization; RD to order.  Nutrition Focused Physical Exam:   Subcutaneous Fat:  Orbital Region: N/A  Upper Arm Region: moderate depletion  Thoracic and Lumbar Region: N/A  Muscle:  Temple Region: moderate depletion  Clavicle Bone Region: severe depletion  Clavicle and Acromion Bone Region: severe depletion  Scapular Bone Region: N/A   Dorsal Hand: moderate depletion  Patellar Region: N/A Anterior Thigh Region: moderate depletion  Posterior Calf Region: severe depletion  Edema: none  Patient continues to meet criteria for severe malnutrition in the context of chronic illness as evidenced by < 75% intake of estimated energy requirement for > 1 month, severe muscle loss and severe subcutaneous fat loss.  Height: Ht Readings from Last 1 Encounters:  05/14/14 5\' 5"  (1.651 m)    Weight: Wt Readings from Last 1 Encounters:  05/19/14 104 lb 4.8 oz (47.31 kg)    Ideal Body Weight: 125 lb  % Ideal Body Weight: 83%  Wt Readings from Last 10 Encounters:  05/19/14 104 lb 4.8 oz (47.31 kg)  05/19/14 104 lb 4.8 oz (47.31 kg)  05/12/14 85 lb 15.7 oz (39 kg)  10/05/13 92 lb (41.731 kg)  10/20/12 102 lb 15.3 oz (46.7 kg)  07/30/11 94 lb 15.9 oz (43.09 kg)    Usual Body Weight: 85 lb  % Usual Body Weight: 122%  BMI:  17.2 kg/m2 (based on 05/19/14 weight)  Estimated Nutritional Needs: Kcal: 1600-1800 Protein: 80-90 gm Fluid: 1.6-1.8 L  Skin: Intact  Diet Order: General  EDUCATION NEEDS: -No education needs identified at this time  Labs:   Recent Labs Lab 07/26/14 0945  NA 141  K 3.7  CL 106  CO2 20  BUN 10  CREATININE 0.80  CALCIUM 7.4*  GLUCOSE 144*    Scheduled Meds: . apixaban  5 mg Oral BID  . aztreonam  1 g Intravenous 3 times per day  . calcium carbonate  2 tablet Oral BID  . folic acid  1 mg Oral Daily  . gabapentin  800 mg Oral TID  . insulin aspart  0-9 Units Subcutaneous TID WC  .  levofloxacin (LEVAQUIN) IV  750 mg Intravenous Once  . [START ON 07/27/2014] levofloxacin (LEVAQUIN) IV  750 mg Intravenous Q24H  . levothyroxine  25 mcg Oral QAC breakfast  . loratadine  10 mg Oral Daily  . pantoprazole  40 mg Oral Daily  . rosuvastatin  20 mg Oral q1800  . sodium chloride  3 mL Intravenous Q12H  . sucralfate  1 g Oral BID WC  . sulfaSALAzine  1,000 mg Oral QPC breakfast  .  thiamine  100 mg Oral Daily  . [START ON 07/27/2014] vancomycin  500 mg Intravenous Q12H  . venlafaxine  25 mg Oral BID    Continuous Infusions: . 0.9 % NaCl with KCl 40 mEq / L      Past Medical History  Diagnosis Date  . Glaucoma   . Diabetes mellitus without complication   . Hypertension   . Neuropathy   . Crohn disease   . Asthma     Past Surgical History  Procedure Laterality Date  . Cesarean section    . Esophagogastroduodenoscopy N/A 05/21/2014    Procedure: ESOPHAGOGASTRODUODENOSCOPY (EGD);  Surgeon: Petra Kuba, MD;  Location: [facility] ENDOSCOPY;  Service: Endoscopy;  Laterality: N/A;    Maureen Chatters, RD, LDN Pager #: [phone] After-Hours Pager #: [phone]

## 2014-07-26 NOTE — ED Notes (Signed)
Per EMS, pt having SOB x 2 days and decreased lung sounds. Pt speaking in short sentences. Given 2 nebs with some relief. Pt hx of tremors and anxiety. sts left rib pain and back pain. Decreased ambulation for a few days.

## 2014-07-26 NOTE — ED Notes (Signed)
Carb modified lunch tray ordered 

## 2014-07-26 NOTE — ED Notes (Signed)
Patient transported to CT 

## 2014-07-26 NOTE — ED Notes (Addendum)
Pt refuses blood draw for second set of blood cultures & Istat CG4

## 2014-07-26 NOTE — Progress Notes (Signed)
Received patient from ED.  Patient oriented to room and unit.  Vitals obtained; telemetry applied.  Will continue to monitor.

## 2014-07-26 NOTE — ED Notes (Signed)
RN Toniann Fail assisted I/O cath

## 2014-07-26 NOTE — Progress Notes (Signed)
Utilization Review Completed.Joleene Burnham T10/26/2015  

## 2014-07-26 NOTE — ED Notes (Signed)
Consulting physician at bedside.

## 2014-07-26 NOTE — ED Provider Notes (Signed)
CSN: [id number]     Arrival date & time 07/26/14  0935 History   First MD Initiated Contact with Patient 07/26/14 [phone]     Chief Complaint  Patient presents with  . Respiratory Distress     (Consider location/radiation/quality/duration/timing/severity/associated sxs/prior Treatment) HPI Comments: Patient presents with respiratory distress. She has a history of diabetes mellitus hypertension, Crohn's disease and pulmonary embolus that was diagnosed in August 2015. She is currently on Eliquis. She states that she called the ambulance this morning because she's had increased shortness of breath and coughing. The coughing and going on for the last 2-3 days. Occasionally it's productive of yellow sputum. She also reports intermittent nausea vomiting and diarrhea going on since August. The last vomiting was yesterday. Last episode of diarrhea was yesterday. She reports she had a fever 2 days ago. She has soreness in her left rib cage. She states she previously had a prior rib fracture to this area and her son was lifting her up a few days ago and she feels like she injured it again. She also states about a week ago she had a fall where she developed a bruise to her left leg and she has intermittent headaches because she said she hit her head at that time as well. She has chronic neck and back pain. She was previously getting pain medicine from an orthopedist however she hasn't had any pain medicine since September. She has ongoing pain to her neck and her back was unchanged from her baseline. She was admitted in August for diarrhea and sepsis. That is when she was diagnosed with a bilateral pulmonary emboli and started on Eliquis.  Pt is very difficult to obtain a hx from. Patient is given 2 nebulizer treatments by EMS. She states she feels a little bit better after that.   Past Medical History  Diagnosis Date  . Glaucoma   . Diabetes mellitus without complication   . Hypertension   . Neuropathy   .  Crohn disease   . Asthma    Past Surgical History  Procedure Laterality Date  . Cesarean section    . Esophagogastroduodenoscopy N/A 05/21/2014    Procedure: ESOPHAGOGASTRODUODENOSCOPY (EGD);  Surgeon: Petra KubaMarc E Magod, MD;  Location[facility] END[facility]COPY;  Service: Endoscopy;  Laterality: N/A;   History reviewed. No pertinent family history. History  Substance Use Topics  . Smoking status: Current Every Day Smoker  . Smokeless tobacco: Not on file  . Alcohol Use: Yes   OB History   Grav Para Term Preterm Abortions TAB SAB Ect Mult Living                 Review of Systems  Constitutional: Positive for appetite change. Negative for fever, chills, diaphoresis and fatigue.  HENT: Negative for congestion, rhinorrhea and sneezing.   Eyes: Negative.   Respiratory: Positive for cough, shortness of breath and wheezing. Negative for chest tightness.   Cardiovascular: Positive for chest pain (left rib pain). Negative for leg swelling.  Gastrointestinal: Positive for nausea, vomiting and diarrhea. Negative for abdominal pain and blood in stool.  Genitourinary: Negative for frequency, hematuria, flank pain and difficulty urinating.  Musculoskeletal: Negative for arthralgias and back pain.  Skin: Negative for rash.  Neurological: Positive for tremors (states that she has had a tremor for years). Negative for dizziness, speech difficulty, weakness, numbness and headaches.      Allergies  Iohexol; Penicillins; and Spiriva handihaler  Home Medications   Prior to Admission medications   Medication Sig  Start Date End Date Taking? Authorizing Provider  alprazolam Prudy Feeler) 2 MG tablet Take 2 mg by mouth 3 (three) times daily as needed for sleep or anxiety.   Yes Historical Provider, MD  amitriptyline (ELAVIL) 50 MG tablet Take 50 mg by mouth at bedtime as needed for sleep.   Yes Historical Provider, MD  apixaban (ELIQUIS) 5 MG TABS tablet Take 5 mg by mouth 2 (two) times daily.   Yes Historical Provider,  MD  calcium carbonate (TUMS - DOSED IN MG ELEMENTAL CALCIUM) 500 MG chewable tablet Chew 2 tablets by mouth 2 (two) times daily.   Yes Historical Provider, MD  cetirizine (ZYRTEC) 10 MG tablet Take 10 mg by mouth every morning.   Yes Historical Provider, MD  folic acid (FOLVITE) 1 MG tablet Take 1 mg by mouth daily.   Yes Historical Provider, MD  gabapentin (NEURONTIN) 800 MG tablet Take 800 mg by mouth 3 (three) times daily.   Yes Historical Provider, MD  HYDROcodone-acetaminophen (NORCO) 10-325 MG per tablet Take 1 tablet by mouth every 6 (six) hours as needed for moderate pain.   Yes Historical Provider, MD  HYDROmorphone (DILAUDID) 4 MG tablet Take 4 mg by mouth every 4 (four) hours as needed for severe pain.   Yes Historical Provider, MD  levothyroxine (SYNTHROID, LEVOTHROID) 25 MCG tablet Take 25 mcg by mouth daily before breakfast.   Yes Historical Provider, MD  meclizine (ANTIVERT) 25 MG tablet Take 25 mg by mouth 3 (three) times daily as needed for dizziness.   Yes Historical Provider, MD  ondansetron (ZOFRAN) 4 MG tablet Take 4 mg by mouth every 8 (eight) hours as needed for nausea or vomiting.   Yes Historical Provider, MD  pantoprazole (PROTONIX) 20 MG tablet Take 40 mg by mouth daily.   Yes Historical Provider, MD  rosuvastatin (CRESTOR) 20 MG tablet Take 20 mg by mouth daily.   Yes Historical Provider, MD  sucralfate (CARAFATE) 1 G tablet Take 1 g by mouth 2 (two) times daily.   Yes Historical Provider, MD  sulfaSALAzine (AZULFIDINE) 500 MG tablet Take 1,000 mg by mouth daily.    Yes Historical Provider, MD  thiamine (VITAMIN B-1) 100 MG tablet Take 100 mg by mouth daily.   Yes Historical Provider, MD  traZODone (DESYREL) 50 MG tablet Take 50 mg by mouth at bedtime as needed for sleep.   Yes Historical Provider, MD  venlafaxine (EFFEXOR) 25 MG tablet Take 25 mg by mouth 2 (two) times daily.   Yes Historical Provider, MD   BP 100/69  Pulse 100  Temp(Src) 100.1 F (37.8 C) (Rectal)   Resp 17  SpO2 100% Physical Exam  Constitutional: She is oriented to person, place, and time. She appears well-developed and well-nourished. She appears distressed.  Anxious, tremors to all extremities, tachypneic  HENT:  Head: Normocephalic and atraumatic.  Dry mucus membranes  Eyes: Pupils are equal, round, and reactive to light.  Neck: Normal range of motion. Neck supple.  Cardiovascular: Normal rate, regular rhythm and normal heart sounds.   Pulmonary/Chest: Effort normal and breath sounds normal. No respiratory distress. She has no wheezes. She has no rales. She exhibits tenderness (diffuse TTP across left chest wall, no bruising or crepitus).  Abdominal: Soft. Bowel sounds are normal. There is tenderness (TTP across upper abd, but pt says that the pain is coming from her left ribs ). There is no rebound and no guarding.  Musculoskeletal: Normal range of motion. She exhibits no edema.  No pain  on palpation or ROM of extremities  Lymphadenopathy:    She has no cervical adenopathy.  Neurological: She is alert and oriented to person, place, and time.  Skin: Skin is warm and dry. No rash noted.  Psychiatric: She has a normal mood and affect.    ED Course  Procedures (including critical care time) Labs Review Labs Reviewed  CBC - Abnormal; Notable for the following:    RBC 3.06 (*)    Hemoglobin 8.7 (*)    HCT 26.7 (*)    All other components within normal limits  COMPREHENSIVE METABOLIC PANEL - Abnormal; Notable for the following:    Glucose, Bld 144 (*)    Calcium 7.4 (*)    Total Protein 5.3 (*)    Albumin 2.2 (*)    Alkaline Phosphatase 155 (*)    Total Bilirubin <0.2 (*)    GFR calc non Af Amer 81 (*)    All other components within normal limits  I-STAT CG4 LACTIC ACID, ED - Abnormal; Notable for the following:    Lactic Acid, Venous 3.97 (*)    All other components within normal limits  CULTURE, BLOOD (ROUTINE X 2)  CULTURE, BLOOD (ROUTINE X 2)  URINE CULTURE   TROPONIN I  PRO B NATRIURETIC PEPTIDE  URINALYSIS, ROUTINE W REFLEX MICROSCOPIC  PREGNANCY, URINE  I-STAT TROPOININ, ED  I-STAT CG4 LACTIC ACID, ED    Imaging Review Ct Head Wo Contrast  07/26/2014   CLINICAL DATA:  Head injury.  Headache.  EXAM: CT HEAD WITHOUT CONTRAST  TECHNIQUE: Contiguous axial images were obtained from the base of the skull through the vertex without intravenous contrast.  COMPARISON:  CT head 05/07/2014  FINDINGS: Ventricle size is normal. Negative for acute hemorrhage. Negative for infarct or mass.  Atherosclerotic calcification is present, advanced for age. Suboccipital craniectomy for posterior fossa decompression. Calvarium otherwise intact.  IMPRESSION: No acute abnormality.  Atherosclerotic disease.   Electronically Signed   By: Marlan Palau M.D.   On: 07/26/2014 10:59   Dg Chest Port 1 View  07/26/2014   CLINICAL DATA:  Short of breath  EXAM: PORTABLE CHEST - 1 VIEW  COMPARISON:  05/09/2014  FINDINGS: The heart size and mediastinal contours are within normal limits. Both lungs are clear. The visualized skeletal structures are unremarkable.  IMPRESSION: No active disease.   Electronically Signed   By: Marlan Palau M.D.   On: 07/26/2014 10:18     EKG Interpretation   Date/Time:  Monday July 26 2014 09:46:50 EDT Ventricular Rate:  145 PR Interval:  100 QRS Duration: 65 QT Interval:  290 QTC Calculation: 450 R Axis:   61 Text Interpretation:  Sinus tachycardia Multiform ventricular premature  complexes Consider right atrial enlargement Nonspecific T abnrm,  anterolateral leads similar to prior EKG Confirmed by Lashawn Bromwell  MD, Kendy Haston  ([id number]) on 07/26/2014 10:04:17 AM      MDM   Final diagnoses:  Head injury  Asthma exacerbation  Dehydration    Patient with a history of diabetes, reactive airway disease, including disease presents with shortness of breath with associated vomiting and diarrhea. She seems to have improved after the nebulizer  treatments that were given by EMS. She was still tachycardic and very anxious on arrival. At this point she is calm down quite a bit and her heart rate has come down to 110s. Her blood pressure was low on arrival but his headache improved after IV fluids. Her lactic acid is up. She was treated presumptively with  broad-spectrum antibiotics for possible sepsis. She is alert oriented and looking much improved at this point. She is requesting pain medication. Initially she told me that she doesn't take pain medication at home however now she states that she takes Dilaudid regularly at home. Her blood pressures within soft tissues given a dose of fentanyl in the ED. I will contact the hospitalist service for admission.    Rolan Bucco, MD 07/26/14 [phone]

## 2014-07-26 NOTE — H&P (Addendum)
History and Physical  [name]:[id number] DOB: 1961/02/09 DOA: 07/26/2014  Referring physician: Dr. Rolan Bucco, EDP PCP: No primary provider on file. States that her PCP is with [facility]- ? Dr. Renie Ora Outpatient Specialists:  1. GI: Dr. Ewing Schlein  Chief Complaint: Cough, dyspnea, chest pain, nausea, vomiting and diarrhea.  HPI: [name] is a 53 y.o. female with history of DM 2, HTN, Crohn's disease, hepatitis C, alcohol induced chronic pancreatitis, chronic abdominal pain, anxiety/depression, self-reported essential tremors, severe protein calorie malnutrition, hospitalized between 05/07/14-05/14/14 for SIRS, nausea, vomiting and diarrhea, Acute bilateral PE, syncope & AKI followed by CIR admission 05/14/14-05/21/14 for deconditioning. She underwent EGD which showed questionable mild gastritis versus gastropathy and was recommended colonoscopy in 3 months. She presented to the ED today with above complaints. Patient is not a good historian. She states that she has been having intermittent left sided chest pain underneath the breast since third week of September with radiation to left side of back. This is made worse by chest wall movements, deep inspiration and when her son picks her up to move her from second floor to first floor off her house. This pain seemed to get worse on Friday when her son was moving her. She also gives history of intermittent cough with cream corn colored sputum. She gives similar duration history of intermittent dyspnea but seemed to get worse this morning with associated wheezing and temperature of 99.28F. She gives history of chronic nausea, vomiting and diarrhea of 8 months duration which she states are intermittent. Emesis is nonbloody and may have it some days and may not have it for days in a stretch. Appetite continues to be poor. She has lost a lot but an undetermined amount of weight. She states that the diarrhea has again recently gotten  worse. She denies blood in stools. She gives history of chronic neck and low back pain. She states that she sees a pain M.D. but is not clear whether she is taking her pain medications. Patient claims that she is compliant with all her medications including anticoagulants. She states that she has had history of falls and the most recent was a week ago where she fell and hurt herself in many places but without bleeding and denies loss of consciousness. In the ED, patient was found to be tachycardic in the 140s, hypotensive 88/71, temperature 100.1, hemoglobin 8.7, troponin 1 negative, proBNP 441, chest x-ray and CT head without acute abnormality, urine pregnancy test negative, UA negative for features of UTI and lactate of 3.97. Patient's blood pressure and tachycardia responded well to IV fluid bolus. Currently she states that she feels better. Hospitalist admission requested.   Review of Systems: All systems reviewed and apart from history of presenting illness, are negative.  Past Medical History  Diagnosis Date  . Glaucoma   . Diabetes mellitus without complication   . Hypertension   . Neuropathy   . Crohn disease   . Asthma    Past Surgical History  Procedure Laterality Date  . Cesarean section    . Esophagogastroduodenoscopy N/A 05/21/2014    Procedure: ESOPHAGOGASTRODUODENOSCOPY (EGD);  Surgeon: Petra Kuba, MD;  Location: [facility] ENDOSCOPY;  Service: Endoscopy;  Laterality: N/A;   Social History:  reports that she has been smoking.  She does not have any smokeless tobacco history on file. She reports that she does not drink alcohol or use illicit drugs. Divorced. Lives with her son. States that she has a walker  and a cane at home. Apparently her son has been lifting her up helping her move around the house especially from second level to first level of house. States that she smokes 5 cigarettes per day but denies alcohol and drug abuse.  Allergies  Allergen Reactions  . Iohexol Other (See  Comments)    "severe burning" Patient has received Contrast in 2005 with 13 hour pre-medication, and had no reaction at that time  . Penicillins Hives  . Spiriva Handihaler [Tiotropium Bromide Monohydrate] Nausea And Vomiting    History reviewed. No pertinent family history. denies family history.  Prior to Admission medications   Medication Sig Start Date End Date Taking? Authorizing Provider  alprazolam Prudy Feeler) 2 MG tablet Take 2 mg by mouth 3 (three) times daily as needed for sleep or anxiety.   Yes Historical Provider, MD  amitriptyline (ELAVIL) 50 MG tablet Take 50 mg by mouth at bedtime as needed for sleep.   Yes Historical Provider, MD  apixaban (ELIQUIS) 5 MG TABS tablet Take 5 mg by mouth 2 (two) times daily.   Yes Historical Provider, MD  calcium carbonate (TUMS - DOSED IN MG ELEMENTAL CALCIUM) 500 MG chewable tablet Chew 2 tablets by mouth 2 (two) times daily.   Yes Historical Provider, MD  cetirizine (ZYRTEC) 10 MG tablet Take 10 mg by mouth every morning.   Yes Historical Provider, MD  folic acid (FOLVITE) 1 MG tablet Take 1 mg by mouth daily.   Yes Historical Provider, MD  gabapentin (NEURONTIN) 800 MG tablet Take 800 mg by mouth 3 (three) times daily.   Yes Historical Provider, MD  HYDROcodone-acetaminophen (NORCO) 10-325 MG per tablet Take 1 tablet by mouth every 6 (six) hours as needed for moderate pain.   Yes Historical Provider, MD  HYDROmorphone (DILAUDID) 4 MG tablet Take 4 mg by mouth every 4 (four) hours as needed for severe pain.   Yes Historical Provider, MD  levothyroxine (SYNTHROID, LEVOTHROID) 25 MCG tablet Take 25 mcg by mouth daily before breakfast.   Yes Historical Provider, MD  meclizine (ANTIVERT) 25 MG tablet Take 25 mg by mouth 3 (three) times daily as needed for dizziness.   Yes Historical Provider, MD  ondansetron (ZOFRAN) 4 MG tablet Take 4 mg by mouth every 8 (eight) hours as needed for nausea or vomiting.   Yes Historical Provider, MD  pantoprazole  (PROTONIX) 20 MG tablet Take 40 mg by mouth daily.   Yes Historical Provider, MD  rosuvastatin (CRESTOR) 20 MG tablet Take 20 mg by mouth daily.   Yes Historical Provider, MD  sucralfate (CARAFATE) 1 G tablet Take 1 g by mouth 2 (two) times daily.   Yes Historical Provider, MD  sulfaSALAzine (AZULFIDINE) 500 MG tablet Take 1,000 mg by mouth daily.    Yes Historical Provider, MD  thiamine (VITAMIN B-1) 100 MG tablet Take 100 mg by mouth daily.   Yes Historical Provider, MD  traZODone (DESYREL) 50 MG tablet Take 50 mg by mouth at bedtime as needed for sleep.   Yes Historical Provider, MD  venlafaxine (EFFEXOR) 25 MG tablet Take 25 mg by mouth 2 (two) times daily.   Yes Historical Provider, MD   Physical Exam: Filed Vitals:   07/26/14 1115 07/26/14 1209 07/26/14 1215 07/26/14 1230  BP: 100/69 106/71 112/82 100/62  Pulse: 100 96  113  Temp:      TempSrc:      Resp: 17 12 11 21   SpO2: 100% 100%  87%   Patient was  examined with a female nurse chaperone in the room.  General exam: Small built and cachectic, chronically ill-looking frail female patient sitting up comfortably propped in bed.  Head, eyes and ENT: Nontraumatic and normocephalic. Pupils equally reacting to light and accommodation. Oral mucosa dry.  Neck: Supple. No JVD, carotid bruit or thyromegaly.  Lymphatics: No lymphadenopathy.  Respiratory system: Clear to auscultation. No increased work of breathing. Reproducible chest wall tenderness underneath the left breast, left infra-axillary region without superficial bruising or deformities.  Cardiovascular system: S1 and S2 heard, mild regular tachycardia. No JVD, murmurs, gallops, clicks or pedal edema.  Gastrointestinal system: Abdomen is nondistended, soft and nontender. Normal bowel sounds heard. No organomegaly or masses appreciated.  Central nervous system: Alert and oriented. No focal neurological deficits.  Extremities: Symmetric 5 x 5 power. Peripheral pulses  symmetrically felt. Intermittent tremors of all extremities.  Skin: No rashes or acute findings.  Musculoskeletal system: Negative exam.  Psychiatry: Pleasant and cooperative.   Labs on Admission:  Basic Metabolic Panel:  Recent Labs Lab 07/26/14 0945  NA 141  K 3.7  CL 106  CO2 20  GLUCOSE 144*  BUN 10  CREATININE 0.80  CALCIUM 7.4*   Liver Function Tests:  Recent Labs Lab 07/26/14 0945  AST 32  ALT 24  ALKPHOS 155*  BILITOT <0.2*  PROT 5.3*  ALBUMIN 2.2*   No results found for this basename: LIPASE, AMYLASE,  in the last 168 hours No results found for this basename: AMMONIA,  in the last 168 hours CBC:  Recent Labs Lab 07/26/14 0945  WBC 8.8  HGB 8.7*  HCT 26.7*  MCV 87.3  PLT 384   Cardiac Enzymes:  Recent Labs Lab 07/26/14 0945  TROPONINI <0.30    BNP (last 3 results)  Recent Labs  07/26/14 1138  PROBNP 440.7*   CBG: No results found for this basename: GLUCAP,  in the last 168 hours  Radiological Exams on Admission: Ct Head Wo Contrast  07/26/2014   CLINICAL DATA:  Head injury.  Headache.  EXAM: CT HEAD WITHOUT CONTRAST  TECHNIQUE: Contiguous axial images were obtained from the base of the skull through the vertex without intravenous contrast.  COMPARISON:  CT head 05/07/2014  FINDINGS: Ventricle size is normal. Negative for acute hemorrhage. Negative for infarct or mass.  Atherosclerotic calcification is present, advanced for age. Suboccipital craniectomy for posterior fossa decompression. Calvarium otherwise intact.  IMPRESSION: No acute abnormality.  Atherosclerotic disease.   Electronically Signed   By: Marlan Palau M.D.   On: 07/26/2014 10:59   Dg Chest Port 1 View  07/26/2014   CLINICAL DATA:  Short of breath  EXAM: PORTABLE CHEST - 1 VIEW  COMPARISON:  05/09/2014  FINDINGS: The heart size and mediastinal contours are within normal limits. Both lungs are clear. The visualized skeletal structures are unremarkable.  IMPRESSION: No  active disease.   Electronically Signed   By: Marlan Palau M.D.   On: 07/26/2014 10:18    EKG: Independently reviewed. Sinus tachycardia at 145 bpm, occasional PVCs and no acute findings.  Assessment/Plan Principal Problem:   SIRS (systemic inflammatory response syndrome) Active Problems:   Normocytic anemia   Nicotine abuse   Physical deconditioning   Pulmonary embolus   Shortness of breath   Dehydration   Hypotension   Nausea vomiting and diarrhea   Chest pain, musculoskeletal   1. SIRS: Unclear etiology. Chest x-ray without acute findings. UA negative. DD-developing pneumonia given history of productive cough, dyspnea and low-grade  fever versus GI related/acute GE complicating chronic vomiting and diarrhea versus other etiology. Admit to telemetry. Follow blood culture results. Continue empiric broad-spectrum IV vancomycin, aztreonam and levofloxacin. FU CXR post hydration in am. Check Influenza panel PCR. 2. Severe dehydration: Aggressive IV fluid hydration and monitor. 3. Hypotension and sinus tachycardia: Secondary to problem #1 and 2. Management as above. Improving. 4. Nausea, vomiting & diarrhea/? acute on chronic: Check stool for C. difficile PCR and GI pathogen panel PCR. By mouth diet as tolerated, when necessary antiemetics, contact isolation until C. difficile is ruled out. Her symptoms may be secondary to chronic pancreatic insufficiency. Less likely to be Crohn's exacerbation-hold off steroids at this time. 5. Dyspnea:? Secondary to acute bronchitis versus developing pneumonia versus asthma/COPD. Dyspnea seems to have resolved. No acute findings on exam or chest x-ray. Does have history of PE but already on anticoagulation. Continue oxygen, when necessary bronchodilators, anticoagulants and monitor. 6. Recent history of bilateral PE: Continue Eliquis. 7. Left-sided chest pain: Seems musculoskeletal in nature. Will get dedicated rib films to rule out fractures. Pain  control. 8. History of hepatitis C: Does not seem to be on any treatment. Outpatient follow-up. 9. DM 2: SSI. For now regular diet due to poor PO intake. 10. Severe protein calorie malnutrition: Nutrition consultation. 11. Chronic pain: Judicious use of opioids. 12. History of anxiety and depression: 13. History of essential tremors. 14. History of falls: PT and OT evaluation. May have to discuss with patient & family to consider safest discharge disposition especially given current ongoing treatment with anticoagulants.? SNF at least short-term for rehabilitation and completion of anticoagulation.     Code Status: Full  Family Communication: None at bedside  Disposition Plan: To be determined   Time spent: 70 minutes  HONGALGI,ANAND, MD, FACP, FHM. [facility] Pager [phone]  If 7PM-7AM, please contact night-coverage [email or web address] Password [email or web address] 07/26/2014, 1:34 PM

## 2014-07-26 NOTE — Progress Notes (Signed)
ANTIBIOTIC CONSULT NOTE - INITIAL  Pharmacy Consult for levaquin, aztreonam, vancomycin Indication: rule out sepsis  Allergies  Allergen Reactions  . Iohexol Other (See Comments)    "severe burning" Patient has received Contrast in 2005 with 13 hour pre-medication, and had no reaction at that time  . Penicillins Hives  . Spiriva Handihaler [Tiotropium Bromide Monohydrate] Nausea And Vomiting    Patient Measurements:    Weight: 47 kg 05/19/14  Vital Signs: Temp: 100.1 F (37.8 C) (10/26 0953) Temp Source: Rectal (10/26 0953) BP: 100/69 mmHg (10/26 1115) Pulse Rate: 100 (10/26 1115) Intake/Output from previous day:   Intake/Output from this shift:    Labs:  Recent Labs  07/26/14 0945  WBC 8.8  HGB 8.7*  PLT 384  CREATININE 0.80   The CrCl is unknown because both a height and weight (above a minimum accepted value) are required for this calculation. No results found for this basename: VANCOTROUGH, VANCOPEAK, VANCORANDOM, GENTTROUGH, GENTPEAK, GENTRANDOM, TOBRATROUGH, TOBRAPEAK, TOBRARND, AMIKACINPEAK, AMIKACINTROU, AMIKACIN,  in the last 72 hours   Microbiology: No results found for this or any previous visit (from the past 720 hour(s)).  Medical History: Past Medical History  Diagnosis Date  . Glaucoma   . Diabetes mellitus without complication   . Hypertension   . Neuropathy   . Crohn disease   . Asthma    Assessment: Pharmacy consulted to dose levaquin, aztreonam and vancomycin for r/o sepsis in this 53 yo F.  Pt in ED with respiratory distress.  PMH: DM, HTN, Crohn's dz, PE diagnosed in 8/15.   Pt with increased SOB and coughing, intermittent N/V and diarrhea since August.  On Eliquis for PE.   Wt in August = 47 kg.  WBC 8.8, creat 0.8. Creat cl ~ 58 ml/min.  Temp 100.1.  Lactic acid elevated at 3.97.   levaquin 10/26>> Aztreonam 10/26>> vanc 10/26>>  10/26 BCx2>> 10/26 Ucx>>  Goal of Therapy:  Vancomycin trough level 15-20 mcg/ml  Plan:  1.  levaquin 750 mg IV x 1, give first, then 750 IV q24 2. Aztreonam 2 mg IV x 1, give 2nd, then 1 gm IV q8h 3. Vancomycin 1 gm IV x 1, give 3rd, then 500 mg IV q12h 4. F/u renal fxn, wbc, temp, culture data 5. Steady-state trough as needed  Herby Abraham, Pharm.D. [phone] 07/26/2014 11:32 AM

## 2014-07-27 ENCOUNTER — Encounter (HOSPITAL_COMMUNITY): Payer: Self-pay | Admitting: Internal Medicine

## 2014-07-27 ENCOUNTER — Inpatient Hospital Stay (HOSPITAL_COMMUNITY): Payer: Medicare Other

## 2014-07-27 DIAGNOSIS — I2699 Other pulmonary embolism without acute cor pulmonale: Secondary | ICD-10-CM

## 2014-07-27 DIAGNOSIS — I959 Hypotension, unspecified: Secondary | ICD-10-CM

## 2014-07-27 LAB — COMPREHENSIVE METABOLIC PANEL
ALBUMIN: 1.6 g/dL — AB (ref 3.5–5.2)
ALT: 17 U/L (ref 0–35)
ANION GAP: 11 (ref 5–15)
AST: 20 U/L (ref 0–37)
Alkaline Phosphatase: 105 U/L (ref 39–117)
BUN: 9 mg/dL (ref 6–23)
CO2: 16 mEq/L — ABNORMAL LOW (ref 19–32)
Calcium: 6.3 mg/dL — CL (ref 8.4–10.5)
Chloride: 115 mEq/L — ABNORMAL HIGH (ref 96–112)
Creatinine, Ser: 0.78 mg/dL (ref 0.50–1.10)
GFR calc non Af Amer: >90 mL/min (ref >90)
GLUCOSE: 117 mg/dL — AB (ref 70–99)
Potassium: 4.3 mEq/L (ref 3.7–5.3)
Sodium: 142 mEq/L (ref 137–147)
TOTAL PROTEIN: 4 g/dL — AB (ref 6.0–8.3)
Total Bilirubin: <0.2 mg/dL — ABNORMAL LOW (ref 0.3–1.2)

## 2014-07-27 LAB — CBC
HEMATOCRIT: 20.2 % — AB (ref 36.0–46.0)
HEMOGLOBIN: 6.5 g/dL — AB (ref 12.0–15.0)
MCH: 29.1 pg (ref 26.0–34.0)
MCHC: 32.2 g/dL (ref 30.0–36.0)
MCV: 90.6 fL (ref 78.0–100.0)
Platelets: 285 10*3/uL (ref 150–400)
RBC: 2.23 MIL/uL — ABNORMAL LOW (ref 3.87–5.11)
RDW: 14.9 % (ref 11.5–15.5)
WBC: 6.4 10*3/uL (ref 4.0–10.5)

## 2014-07-27 LAB — GI PATHOGEN PANEL BY PCR, STOOL
C difficile toxin A/B: NEGATIVE
Campylobacter by PCR: NEGATIVE
Cryptosporidium by PCR: NEGATIVE
E COLI (ETEC) LT/ST: NEGATIVE
E COLI 0157 BY PCR: NEGATIVE
E coli (STEC): NEGATIVE
G lamblia by PCR: NEGATIVE
Norovirus GI/GII: NEGATIVE
Rotavirus A by PCR: NEGATIVE
SALMONELLA BY PCR: NEGATIVE
Shigella by PCR: NEGATIVE

## 2014-07-27 LAB — URINE CULTURE
Colony Count: NO GROWTH
Culture: NO GROWTH

## 2014-07-27 LAB — GLUCOSE, CAPILLARY
Glucose-Capillary: 80 mg/dL (ref 70–99)
Glucose-Capillary: 130 mg/dL — ABNORMAL HIGH (ref 70–99)
Glucose-Capillary: 109 mg/dL — ABNORMAL HIGH (ref 70–99)
Glucose-Capillary: 151 mg/dL — ABNORMAL HIGH (ref 70–99)

## 2014-07-27 LAB — IRON AND TIBC
Iron: 17 ug/dL — ABNORMAL LOW (ref 42–135)
SATURATION RATIOS: 8 % — AB (ref 20–55)
TIBC: 204 ug/dL — ABNORMAL LOW (ref 250–470)
UIBC: 187 ug/dL (ref 125–400)

## 2014-07-27 LAB — FERRITIN: Ferritin: 20 ng/mL (ref 10–291)

## 2014-07-27 LAB — PREPARE RBC (CROSSMATCH)

## 2014-07-27 LAB — LACTIC ACID, PLASMA: Lactic Acid, Venous: 2.6 mmol/L — ABNORMAL HIGH (ref 0.5–2.2)

## 2014-07-27 MED ORDER — SODIUM CHLORIDE 0.9 % IV BOLUS (SEPSIS)
500.0000 mL | Freq: Once | INTRAVENOUS | Status: AC
  Administered 2014-07-27: 500 mL via INTRAVENOUS

## 2014-07-27 MED ORDER — ALPRAZOLAM 0.5 MG PO TABS
1.0000 mg | ORAL_TABLET | Freq: Three times a day (TID) | ORAL | Status: DC | PRN
  Administered 2014-07-27 – 2014-07-28 (×3): 1 mg via ORAL
  Filled 2014-07-27 (×3): qty 2

## 2014-07-27 MED ORDER — TRAZODONE 25 MG HALF TABLET
25.0000 mg | ORAL_TABLET | Freq: Every evening | ORAL | Status: DC | PRN
  Filled 2014-07-27: qty 1

## 2014-07-27 MED ORDER — LEVOFLOXACIN IN D5W 750 MG/150ML IV SOLN
750.0000 mg | INTRAVENOUS | Status: DC
  Administered 2014-07-28: 750 mg via INTRAVENOUS
  Filled 2014-07-27: qty 150

## 2014-07-27 MED ORDER — VANCOMYCIN HCL IN DEXTROSE 750-5 MG/150ML-% IV SOLN
750.0000 mg | INTRAVENOUS | Status: DC
  Administered 2014-07-27: 750 mg via INTRAVENOUS
  Filled 2014-07-27 (×2): qty 150

## 2014-07-27 MED ORDER — SODIUM CHLORIDE 0.9 % IV SOLN: Freq: Once | INTRAVENOUS | Status: DC
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
## 2014-07-27 NOTE — Progress Notes (Signed)
ANTIBIOTIC CONSULT NOTE - FOLLOW UP  Pharmacy Consult for Vancomycin, Levofloxacin Indication: R/O sepsis  Allergies  Allergen Reactions  . Iohexol Other (See Comments)    "severe burning" Patient has received Contrast in 2005 with 13 hour pre-medication, and had no reaction at that time  . Penicillins Hives  . Spiriva Handihaler [Tiotropium Bromide Monohydrate] Nausea And Vomiting    Patient Measurements: Height: 5' 4.96" (165 cm) Weight: 83 lb 11.2 oz (37.966 kg) IBW/kg (Calculated) : 56.91 Adjusted Body Weight:   Vital Signs: Temp: 97.9 F (36.6 C) (10/27 0406) Temp Source: Oral (10/27 0406) BP: 87/62 mmHg (10/27 0406) Pulse Rate: 99 (10/27 0406) Intake/Output from previous day: 10/26 0701 - 10/27 0700 In: 240 [P.O.:240] Out: -  Intake/Output from this shift:    Labs:  Recent Labs  07/26/14 0945 07/27/14 0500  WBC 8.8 6.4  HGB 8.7* 6.5*  PLT 384 285  CREATININE 0.80 0.78   Estimated Creatinine Clearance: 47.7 ml/min (by C-G formula based on Cr of 0.78). No results found for this basename: VANCOTROUGH, Leodis BinetVANCOPEAK, VANCORANDOM, GENTTROUGH, GENTPEAK, GENTRANDOM, TOBRATROUGH, TOBRAPEAK, TOBRARND, AMIKACINPEAK, AMIKACINTROU, AMIKACIN,  in the last 72 hours   Microbiology: Recent Results (from the past 720 hour(s))  CULTURE, BLOOD (ROUTINE X 2)     Status: None   Collection Time    07/26/14 11:45 AM      Result Value Ref Range Status   Specimen Description BLOOD LEFT HAND   Final   Special Requests BOTTLES DRAWN AEROBIC AND ANAEROBIC 5MLS   Final   Culture  Setup Time     Final   Value: 07/26/2014 18:15     [facility]   Culture     Final   Value:        BLOOD CULTURE RECEIVED NO GROWTH TO DATE CULTURE WILL BE HELD FOR 5 DAYS BEFORE ISSUING A FINAL NEGATIVE REPORT     [facility]formed [facility]   Report Status PENDING   Incomplete  CULTURE, BLOOD (ROUTINE X 2)     Status: None   Collection Time    07/26/14  4:42 PM      Result  Value Ref Range Status   Specimen Description BLOOD LEFT ANTECUBITAL   Final   Special Requests BOTTLES DRAWN AEROBIC AND ANAEROBIC 10CC   Final   Culture  Setup Time     Final   Value: 07/26/2014 21:12     [facility]formed at [facility]   Culture     Final   Value:        BLOOD CULTURE RECEIVED NO GROWTH TO DATE CULTURE WILL BE HELD FOR 5 DAYS BEFORE ISSUING A FINAL NEGATIVE REPORT     [facility]formed at [facility]   Report Status PENDING   Incomplete  CLOSTRIDIUM DIFFICILE BY PCR     Status: None   Collection Time    07/26/14  4:42 PM      Result Value Ref Range Status   C difficile by pcr NEGATIVE  NEGATIVE Final    Anti-infectives   Start     Dose/Rate Route Frequency Ordered Stop   07/27/14 1200  levofloxacin (LEVAQUIN) IVPB 750 mg     750 mg 100 mL/hr over 90 Minutes Intravenous Every 24 hours 07/26/14 1125     07/27/14 0600  vancomycin (VANCOCIN) 500 mg in sodium chloride 0.9 % 100 mL IVPB     500 mg 100 mL/hr over 60 Minutes Intravenous Every 12 hours 07/26/14  1129     07/26/14 2200  aztreonam (AZACTAM) 1 g in dextrose 5 % 50 mL IVPB     1 g 100 mL/hr over 30 Minutes Intravenous 3 times per day 07/26/14 1126     07/26/14 1130  levofloxacin (LEVAQUIN) IVPB 750 mg     750 mg 100 mL/hr over 90 Minutes Intravenous  Once 07/26/14 1115 07/26/14 1444   07/26/14 1130  aztreonam (AZACTAM) 2 g in dextrose 5 % 50 mL IVPB     2 g 100 mL/hr over 30 Minutes Intravenous  Once 07/26/14 1115 07/26/14 1216   07/26/14 1130  vancomycin (VANCOCIN) IVPB 1000 mg/200 mL premix     1,000 mg 200 mL/hr over 60 Minutes Intravenous  Once 07/26/14 1115 07/26/14 1313      Assessment: 53yo female on Aztreonam, Levofloxacin, and Vancomycin for R/O sepsis.  Weight is down ~10kg since August admission, Cr Cl is ~27ml/min.  Will adjust Levofloxacin and Vancomycin doses.  Blood cx and Urine cx are NTD.  CDiff (-).  Pt with recent fall pta, on multiple medications at home for "PRN sleep".  CT of  head is negative for bleed and Apixaban 5mg  bid for 8/15 PE continued.  Dose is appropriate.  Goal of Therapy:  Vancomycin trough level 15-20 mcg/ml  Plan:  Change levofloxacin to 750mg  IV q48 Change Vancomycin to 750mg  IV q24 F/U cultures and trend clinical status Vancomycin trough when appropriate. Discuss duplicate medications for sleep with MD  Marisue Humble, PharmD Clinical Pharmacist [facility]

## 2014-07-27 NOTE — Consult Note (Signed)
[name] Reason for [name]: nausea and vomiting and diarrhea Referring Physician: [facility] hospitalist. PCP: unclear. Primary G.I.: patient seen by unassigned position most recently by Dr. Orpah [name] is an 53 y.o. female.  HPI: patient was hospitalized several months ago. She had been sick at home and nausea vomiting and diarrhea. She has chronic pain has a history of alcohol abuse in the past apparently causing chronic pancreatitis. She also carries a diagnosis of Crohn's disease seen in the past by Dr. Wynetta [name] and he apparently perform colonoscopy in 2008 with no active Crohn's disease. When she was admitted in August she had a a CT of the abdomen showing dilated common bile duct 1.5 cm. She has a history of polypharmacy chronic narcotic use diabetes anxiety and depression etc. Their notes in the chart the patient has been seen by Dr. [name], [place], and [facility]. Apparently she was to follow-up with Dr. Watt Climes after her last admission. During the last admission she did have EGD by Dr. Watt Climes showing only some gastritis. Ultrasounds have revealed chronic dilation of the CBD going back some time as well as a gallbladder polyp. CT scan during last admission showed increasing dilatation of the pancreatic duct and MRCP was recommended but apparently has not been done. She has had a history of previous pulmonary emboli. She was readmitted with nausea vomiting and diarrhea abdominal pain appeared to be unchanged poor appetite and weight loss. She's had issues with falling etc. Her labs on the submission shows severe malnutrition, iron deficiency, and anemia with a hemoglobin a 6.5 which may have been due to rehydration. Her hemoglobin was 9.4 several months ago. She saw Dr. Watt Climes in the office recently and is unable to tell me anything about that office visit. She still is having profound diarrhea. Culture and G.I. pathogen panel C. difficile are all pending at this time. The  patient is quite confused and I'm able to give me any history other than to say she's having diarrhea weakness nausea and difficulty eating. Apparently, she blames her difficulty eating on the woman who is helping prepare food. She says it does not taste good.  Past Medical History  Diagnosis Date  . Glaucoma   . Diabetes mellitus without complication   . Hypertension   . Neuropathy   . Crohn disease   . Asthma   . Pulmonary embolus 05/24/2014    Past Surgical History  Procedure Laterality Date  . Cesarean section    . Esophagogastroduodenoscopy N/A 05/21/2014    Procedure: ESOPHAGOGASTRODUODENOSCOPY (EGD);  Surgeon: Jeryl Columbia, MD;  Location[facility] ENDOSCOPY;  Service: Endoscopy;  Laterality: N/A;    History reviewed. No pertinent family history.  Social History:  reports that she has been smoking.  She has never used smokeless tobacco. She reports that she does not drink alcohol or use illicit drugs.  Allergies:  Allergies  Allergen Reactions  . Iohexol Other (See Comments)    "severe burning" Patient has received Contrast in 2005 with 13 hour pre-medication, and had no reaction at that time  . Penicillins Hives  . Spiriva Handihaler [Tiotropium Bromide Monohydrate] Nausea And Vomiting    Medications; Prior to Admission medications   Medication Sig Start Date End Date Taking? Authorizing Provider  alprazolam Duanne Moron) 2 MG tablet Take 2 mg by mouth 3 (three) times daily as needed for sleep or anxiety.   Yes Historical Provider, MD  amitriptyline (ELAVIL) 50 MG tablet Take 50 mg by mouth at bedtime as needed  for sleep.   Yes Historical Provider, MD  apixaban (ELIQUIS) 5 MG TABS tablet Take 5 mg by mouth 2 (two) times daily.   Yes Historical Provider, MD  calcium carbonate (TUMS - DOSED IN MG ELEMENTAL CALCIUM) 500 MG chewable tablet Chew 2 tablets by mouth 2 (two) times daily.   Yes Historical Provider, MD  cetirizine (ZYRTEC) 10 MG tablet Take 10 mg by mouth every morning.   Yes  Historical Provider, MD  folic acid (FOLVITE) 1 MG tablet Take 1 mg by mouth daily.   Yes Historical Provider, MD  gabapentin (NEURONTIN) 800 MG tablet Take 800 mg by mouth 3 (three) times daily.   Yes Historical Provider, MD  HYDROcodone-acetaminophen (NORCO) 10-325 MG per tablet Take 1 tablet by mouth every 6 (six) hours as needed for moderate pain.   Yes Historical Provider, MD  HYDROmorphone (DILAUDID) 4 MG tablet Take 4 mg by mouth every 4 (four) hours as needed for severe pain.   Yes Historical Provider, MD  levothyroxine (SYNTHROID, LEVOTHROID) 25 MCG tablet Take 25 mcg by mouth daily before breakfast.   Yes Historical Provider, MD  meclizine (ANTIVERT) 25 MG tablet Take 25 mg by mouth 3 (three) times daily as needed for dizziness.   Yes Historical Provider, MD  ondansetron (ZOFRAN) 4 MG tablet Take 4 mg by mouth every 8 (eight) hours as needed for nausea or vomiting.   Yes Historical Provider, MD  pantoprazole (PROTONIX) 20 MG tablet Take 40 mg by mouth daily.   Yes Historical Provider, MD  rosuvastatin (CRESTOR) 20 MG tablet Take 20 mg by mouth daily.   Yes Historical Provider, MD  sucralfate (CARAFATE) 1 G tablet Take 1 g by mouth 2 (two) times daily.   Yes Historical Provider, MD  sulfaSALAzine (AZULFIDINE) 500 MG tablet Take 1,000 mg by mouth daily.    Yes Historical Provider, MD  thiamine (VITAMIN B-1) 100 MG tablet Take 100 mg by mouth daily.   Yes Historical Provider, MD  traZODone (DESYREL) 50 MG tablet Take 50 mg by mouth at bedtime as needed for sleep.   Yes Historical Provider, MD  venlafaxine (EFFEXOR) 25 MG tablet Take 25 mg by mouth 2 (two) times daily.   Yes Historical Provider, MD   . sodium chloride   Intravenous Once  . apixaban  5 mg Oral BID  . aztreonam  1 g Intravenous 3 times per day  . calcium carbonate  2 tablet Oral BID  . feeding supplement (RESOURCE BREEZE)  1 Container Oral TID BM  . folic acid  1 mg Oral Daily  . gabapentin  800 mg Oral TID  . insulin  aspart  0-9 Units Subcutaneous TID WC  . [START ON 07/28/2014] levofloxacin (LEVAQUIN) IV  750 mg Intravenous Q48H  . levothyroxine  25 mcg Oral QAC breakfast  . loratadine  10 mg Oral Daily  . pantoprazole  40 mg Oral Daily  . rosuvastatin  20 mg Oral q1800  . sodium chloride  500 mL Intravenous Once  . sodium chloride  3 mL Intravenous Q12H  . sucralfate  1 g Oral BID WC  . sulfaSALAzine  1,000 mg Oral QPC breakfast  . thiamine  100 mg Oral Daily  . vancomycin  750 mg Intravenous Q24H  . venlafaxine  25 mg Oral BID   PRN Meds acetaminophen, acetaminophen, albuterol, alprazolam, HYDROmorphone, ondansetron (ZOFRAN) IV, ondansetron, traZODone Results for orders placed during the hospital encounter of 07/26/14 (from the past 48 hour(s))  CBC  Status: Abnormal   Collection Time    07/26/14  9:45 AM      Result Value Ref Range   WBC 8.8  4.0 - 10.5 K/uL   RBC 3.06 (*) 3.87 - 5.11 MIL/uL   Hemoglobin 8.7 (*) 12.0 - 15.0 g/dL   HCT 26.7 (*) 36.0 - 46.0 %   MCV 87.3  78.0 - 100.0 fL   MCH 28.4  26.0 - 34.0 pg   MCHC 32.6  30.0 - 36.0 g/dL   RDW 14.9  11.5 - 15.5 %   Platelets 384  150 - 400 K/uL  COMPREHENSIVE METABOLIC PANEL     Status: Abnormal   Collection Time    07/26/14  9:45 AM      Result Value Ref Range   Sodium 141  137 - 147 mEq/L   Potassium 3.7  3.7 - 5.3 mEq/L   Chloride 106  96 - 112 mEq/L   CO2 20  19 - 32 mEq/L   Glucose, Bld 144 (*) 70 - 99 mg/dL   BUN 10  6 - 23 mg/dL   Creatinine, Ser 0.80  0.50 - 1.10 mg/dL   Calcium 7.4 (*) 8.4 - 10.5 mg/dL   Total Protein 5.3 (*) 6.0 - 8.3 g/dL   Albumin 2.2 (*) 3.5 - 5.2 g/dL   AST 32  0 - 37 U/L   ALT 24  0 - 35 U/L   Alkaline Phosphatase 155 (*) 39 - 117 U/L   Total Bilirubin <0.2 (*) 0.3 - 1.2 mg/dL   GFR calc non Af Amer 81 (*) >90 mL/min   GFR calc Af Amer >90  >90 mL/min   Comment: (NOTE)     The eGFR has been calculated using the CKD EPI equation.     This calculation has not been validated in all  clinical situations.     eGFR's persistently <90 mL/min signify possible Chronic Kidney     Disease.   Anion gap 15  5 - 15  TROPONIN I     Status: None   Collection Time    07/26/14  9:45 AM      Result Value Ref Range   Troponin I <0.30  <0.30 ng/mL   Comment:            Due to the release kinetics of cTnI,     a negative result within the first hours     of the onset of symptoms does not rule out     myocardial infarction with certainty.     If myocardial infarction is still suspected,     repeat the test at appropriate intervals.  Randolm Idol, ED     Status: None   Collection Time    07/26/14 10:01 AM      Result Value Ref Range   Troponin i, poc 0.01  0.00 - 0.08 ng/mL   Comment 3            Comment: Due to the release kinetics of cTnI,     a negative result within the first hours     of the onset of symptoms does not rule out     myocardial infarction with certainty.     If myocardial infarction is still suspected,     repeat the test at appropriate intervals.  I-STAT CG4 LACTIC ACID, ED     Status: Abnormal   Collection Time    07/26/14 10:03 AM      Result Value Ref Range  Lactic Acid, Venous 3.97 (*) 0.5 - 2.2 mmol/L  PRO B NATRIURETIC PEPTIDE     Status: Abnormal   Collection Time    07/26/14 11:38 AM      Result Value Ref Range   Pro B Natriuretic peptide (BNP) 440.7 (*) 0 - 125 pg/mL  CULTURE, BLOOD (ROUTINE X 2)     Status: None   Collection Time    07/26/14 11:45 AM      Result Value Ref Range   Specimen Description BLOOD LEFT HAND     Special Requests BOTTLES DRAWN AEROBIC AND ANAEROBIC 5MLS     Culture  Setup Time       Value: 07/26/2014 18:15     Performed at [facility]   Culture       Value:        BLOOD CULTURE RECEIVED NO GROWTH TO DATE CULTURE WILL BE HELD FOR 5 DAYS BEFORE ISSUING A FINAL NEGATIVE REPORT     Performed at [facility]   Report Status PENDING    URINALYSIS, ROUTINE W REFLEX MICROSCOPIC     Status: Abnormal    Collection Time    07/26/14 12:02 PM      Result Value Ref Range   Color, Urine YELLOW  YELLOW   APPearance CLEAR  CLEAR   Specific Gravity, Urine 1.013  1.005 - 1.030   pH 6.0  5.0 - 8.0   Glucose, UA NEGATIVE  NEGATIVE mg/dL   Hgb urine dipstick NEGATIVE  NEGATIVE   Bilirubin Urine NEGATIVE  NEGATIVE   Ketones, ur 15 (*) NEGATIVE mg/dL   Protein, ur NEGATIVE  NEGATIVE mg/dL   Urobilinogen, UA 0.2  0.0 - 1.0 mg/dL   Nitrite NEGATIVE  NEGATIVE   Leukocytes, UA NEGATIVE  NEGATIVE   Comment: MICROSCOPIC NOT DONE ON URINES WITH NEGATIVE PROTEIN, BLOOD, LEUKOCYTES, NITRITE, OR GLUCOSE <1000 mg/dL.  PREGNANCY, URINE     Status: None   Collection Time    07/26/14 12:02 PM      Result Value Ref Range   Preg Test, Ur NEGATIVE  NEGATIVE   Comment:            THE SENSITIVITY OF THIS     METHODOLOGY IS >20 mIU/mL.  CULTURE, BLOOD (ROUTINE X 2)     Status: None   Collection Time    07/26/14  4:42 PM      Result Value Ref Range   Specimen Description BLOOD LEFT ANTECUBITAL     Special Requests BOTTLES DRAWN AEROBIC AND ANAEROBIC 10CC     Culture  Setup Time       Value: 07/26/2014 21:12     Performed at [facility]   Culture       Value:        BLOOD CULTURE RECEIVED NO GROWTH TO DATE CULTURE WILL BE HELD FOR 5 DAYS BEFORE ISSUING A FINAL NEGATIVE REPORT     Performed at [facility]   Report Status PENDING    CLOSTRIDIUM DIFFICILE BY PCR     Status: None   Collection Time    07/26/14  4:42 PM      Result Value Ref Range   C difficile by pcr NEGATIVE  NEGATIVE  INFLUENZA PANEL BY PCR (TYPE A & B, H1N1)     Status: None   Collection Time    07/26/14  4:42 PM      Result Value Ref Range   Influenza A By PCR NEGATIVE  NEGATIVE   Influenza B By PCR NEGATIVE  NEGATIVE   H1N1 flu by pcr NOT DETECTED  NOT DETECTED   Comment:            The Xpert Flu assay (FDA approved for     nasal aspirates or washes and     nasopharyngeal swab specimens), is     intended as an  aid in the diagnosis of     influenza and should not be used as     a sole basis for treatment.  GLUCOSE, CAPILLARY     Status: None   Collection Time    07/26/14  4:57 PM      Result Value Ref Range   Glucose-Capillary 81  70 - 99 mg/dL  GLUCOSE, CAPILLARY     Status: Abnormal   Collection Time    07/26/14  9:17 PM      Result Value Ref Range   Glucose-Capillary 103 (*) 70 - 99 mg/dL  COMPREHENSIVE METABOLIC PANEL     Status: Abnormal   Collection Time    07/27/14  5:00 AM      Result Value Ref Range   Sodium 142  137 - 147 mEq/L   Potassium 4.3  3.7 - 5.3 mEq/L   Chloride 115 (*) 96 - 112 mEq/L   CO2 16 (*) 19 - 32 mEq/L   Glucose, Bld 117 (*) 70 - 99 mg/dL   BUN 9  6 - 23 mg/dL   Creatinine, Ser 0.78  0.50 - 1.10 mg/dL   Calcium 6.3 (*) 8.4 - 10.5 mg/dL   Comment: CRITICAL RESULT CALLED TO, READ BACK BY AND VERIFIED WITH:     P. KUFFOUR,RN AT 0720 07/27/14 BY ZBEECH.   Total Protein 4.0 (*) 6.0 - 8.3 g/dL   Albumin 1.6 (*) 3.5 - 5.2 g/dL   AST 20  0 - 37 U/L   ALT 17  0 - 35 U/L   Alkaline Phosphatase 105  39 - 117 U/L   Total Bilirubin <0.2 (*) 0.3 - 1.2 mg/dL   GFR calc non Af Amer >90  >90 mL/min   GFR calc Af Amer >90  >90 mL/min   Comment: (NOTE)     The eGFR has been calculated using the CKD EPI equation.     This calculation has not been validated in all clinical situations.     eGFR's persistently <90 mL/min signify possible Chronic Kidney     Disease.   Anion gap 11  5 - 15  CBC     Status: Abnormal   Collection Time    07/27/14  5:00 AM      Result Value Ref Range   WBC 6.4  4.0 - 10.5 K/uL   RBC 2.23 (*) 3.87 - 5.11 MIL/uL   Hemoglobin 6.5 (*) 12.0 - 15.0 g/dL   Comment: REPEATED TO VERIFY     CRITICAL RESULT CALLED TO, READ BACK BY AND VERIFIED WITH:     TAMARA WASHINGTON,RN AT 9767 07/27/14 BY ZBEECH.   HCT 20.2 (*) 36.0 - 46.0 %   MCV 90.6  78.0 - 100.0 fL   MCH 29.1  26.0 - 34.0 pg   MCHC 32.2  30.0 - 36.0 g/dL   RDW 14.9  11.5 - 15.5 %    Platelets 285  150 - 400 K/uL   Comment: REPEATED TO VERIFY  GLUCOSE, CAPILLARY     Status: None   Collection Time    07/27/14  6:28 AM  Result Value Ref Range   Glucose-Capillary 80  70 - 99 mg/dL  PREPARE RBC (CROSSMATCH)     Status: None   Collection Time    07/27/14  7:30 AM      Result Value Ref Range   Order Confirmation ORDER PROCESSED BY BLOOD BANK    TYPE AND SCREEN     Status: None   Collection Time    07/27/14  7:30 AM      Result Value Ref Range   ABO/RH(D) B POS     Antibody Screen NEG     Sample Expiration 07/30/2014     Unit Number [id number]     Blood Component Type RED CELLS,LR     Unit division 00     Status of Unit ALLOCATED     Transfusion Status OK TO TRANSFUSE     Crossmatch Result Compatible     Unit Number [id number]     Blood Component Type RED CELLS,LR     Unit division 00     Status of Unit ISSUED     Transfusion Status OK TO TRANSFUSE     Crossmatch Result Compatible    IRON AND TIBC     Status: Abnormal   Collection Time    07/27/14  7:57 AM      Result Value Ref Range   Iron 17 (*) 42 - 135 ug/dL   TIBC 204 (*) 250 - 470 ug/dL   Saturation Ratios 8 (*) 20 - 55 %   UIBC 187  125 - 400 ug/dL   Comment: Performed at [place]     Status: None   Collection Time    07/27/14  7:57 AM      Result Value Ref Range   Ferritin 20  10 - 291 ng/mL   Comment: Performed at [place] ACID, PLASMA     Status: Abnormal   Collection Time    07/27/14  9:12 AM      Result Value Ref Range   Lactic Acid, Venous 2.6 (*) 0.5 - 2.2 mmol/L  GLUCOSE, CAPILLARY     Status: Abnormal   Collection Time    07/27/14  1:09 PM      Result Value Ref Range   Glucose-Capillary 130 (*) 70 - 99 mg/dL    X-ray Chest Pa And Lateral  07/27/2014   CLINICAL DATA:  Dyspnea, personal history of diabetes mellitus, hypertension, asthma, Crohn's disease  EXAM: CHEST  2 VIEW  COMPARISON:  Portable exam 0612 hr compared to  07/26/2014  FINDINGS: Normal heart size, mediastinal contours, and pulmonary vascularity.  Peribronchial thickening and minimal hyperinflation consistent with history of asthma.  No acute infiltrate, pleural effusion or pneumothorax.  Diffuse osseous demineralization.  IMPRESSION: Peribronchial thickening and hyperinflation consistent with history of asthma.  No acute infiltrate.   Electronically Signed   By: [name] M.D.   On: 07/27/2014 08:02   Dg Ribs Unilateral Left  07/26/2014   CLINICAL DATA:  [place] 10 days ago at home. Persistent left-sided mid rib pain and shortness of Breath.  EXAM: LEFT RIBS - 2 VIEW  COMPARISON:  Chest x-ray 07/26/2014 and chest CT 05/08/2014  FINDINGS: The cardiac silhouette, mediastinal and hilar contours are within normal limits. The left lung is clear. No pleural effusion, pleural thickening or pneumothorax.  Examination of the left ribs demonstrate state remote healed tenth rib fracture. Suspected nondisplaced anterior 6th rib fracture.  IMPRESSION: Remote healed appearing left tenth  rib fracture and suspected nondisplaced anterior sixth rib fracture.  No acute pulmonary findings.   Electronically Signed   By: [name] M.D.   On: 07/26/2014 15:34   Ct Head Wo Contrast  07/26/2014   CLINICAL DATA:  Head injury.  Headache.  EXAM: CT HEAD WITHOUT CONTRAST  TECHNIQUE: Contiguous axial images were obtained from the base of the skull through the vertex without intravenous contrast.  COMPARISON:  CT head 05/07/2014  FINDINGS: Ventricle size is normal. Negative for acute hemorrhage. Negative for infarct or mass.  Atherosclerotic calcification is present, advanced for age. Suboccipital craniectomy for posterior fossa decompression. Calvarium otherwise intact.  IMPRESSION: No acute abnormality.  Atherosclerotic disease.   Electronically Signed   By: [name] M.D.   On: 07/26/2014 10:59   Dg Chest Port 1 View  07/26/2014   CLINICAL DATA:  Short of breath  EXAM:  PORTABLE CHEST - 1 VIEW  COMPARISON:  05/09/2014  FINDINGS: The heart size and mediastinal contours are within normal limits. Both lungs are clear. The visualized skeletal structures are unremarkable.  IMPRESSION: No active disease.   Electronically Signed   By: [name] M.D.   On: 07/26/2014 10:18                Blood pressure 86/56, pulse 81, temperature 98.3 F (36.8 C), temperature source Oral, resp. rate 18, height 5' 4.96" (1.65 m), weight 37.966 kg (83 lb 11.2 oz), last menstrual period 10/01/2010, SpO2 100.00%.  Physical exam:   General--pleasantly confused African-American female who is unable to give me much in the way of history. Neck - without any lymphadenopathy or thyroid medically Heart-- regular rate and rhythm without murmurs or gallops Lungs-- clear  Abdomen--soft and nontender with good bowel sounds. Extremities - no gross edema    Assessment: 1. Nausea vomiting and diarrhea of unclear etiology. Stool workup underway. Previous studies have shown abnormalities of the pancreas and MRCP was recommended but apparently never done  2. SIRS versus sepsis. Workup underway including cultures. Broad-spectrum antibiotics started.  3. Profound anemia. There is no clear G.I. bleeding. Stool for FOB pending  4. Recent falls possibly due to dehydration.  5. History of pulmonary emboli on Eliquis  6. History of hepatitis C  7. Confusion. Cause unclear.  8. Profound malnutrition   Plan: 1. Will check the records of Dr. Watt Climes from the office. Apparently there were some plans to do a colonoscopy at some point may need to do that this admission when she is more stable. 2. She has had abnormality of the pancreas and MRI has been recommended by several G.I. doctors but has not been done for unclear reasons. Make sure we get that done on this admission and will also check fecal elastase   [name] JR,Royale Lennartz L 07/27/2014, 3:42 PM     .

## 2014-07-27 NOTE — Progress Notes (Signed)
Pt received 1unit of RBC with no allergic reaction noted; second unit RBC infusing; no allergic reaction noted or reported yet; pt remains stable and reported off to incoming RN. Arabella MerlesP. Amo Cathi Hazan RN.

## 2014-07-27 NOTE — Progress Notes (Signed)
OT Cancellation Note  Patient Details Name: [name] MRN: [id number] DOB: 1960-10-17   Cancelled Treatment:    Reason Eval/Treat Not Completed: Medical issues which prohibited therapy (Pt with hgb of 6.5) Will continue to follow.  Evern Bio 07/27/2014, 8:51 AM [phone]

## 2014-07-27 NOTE — Progress Notes (Signed)
PT Cancellation Note  Patient Details Name: [name] MRN: [id number] DOB: 12/16/1960   Cancelled Treatment:    Reason Eval/Treat Not Completed: Fatigue/lethargy limiting ability to participate;Medical issues which prohibited therapy  Patient requests PT evaluation be held until a later time due to feeling unwell and fatigued. Of note last Hgb recorded at 6.5 and BP of 86/56. Will follow up for attempt to evaluate tomorrow.   [address][name], [place] [phone]   [place], [name] 07/27/2014, 3:32 PM

## 2014-07-27 NOTE — Progress Notes (Signed)
CRITICAL VALUE ALERT  Critical value received:  hgb :6.5  Date of notification:  07/27/2014  Time of notification:  0623                    Critical value read back: yes  Nurse who received alert:  Blanchard Maneamara washington rn   MD notified (1st page):  TR[facility]  Time of first page:  0625  MD notified (2nd page):  Time of second page:  Responding MD:   Time MD responded:

## 2014-07-27 NOTE — Care Management Note (Signed)
Page 1 of 2   08/02/2014     5:23:40 PM CARE MANAGEMENT NOTE 08/02/2014  Patient:  [name], [name]   Account Number:  [id number]  Date Initiated:  07/27/2014  Documentation initiated by:  Marisol Glazer  Subjective/Objective Assessment:   Pt adm on 07/26/14 with SOB, N/V/D, anemia, and dehydration.  PTA,pt resided at home with son.     Action/Plan:   Will follow for dc needs as pt progresses.  PT unable to eval today due to medical issues.  Will follow progress.   Anticipated DC Date:  08/02/2014   Anticipated DC Plan:  HOME W HOME HEALTH SERVICES  In-house referral  Clinical Social Worker      DC Planning Services  CM consult      [facility] Choice  HOME HEALTH  Resumption Of Svcs/PTA Provider   Choice offered to / List presented to:  C-1 Patient        HH arranged  HH-2 PT  HH-1 RN  HH-3 OT  HH-4 NURSE'S AIDE      HH agency  [facility]   Status of service:  Completed, signed off Medicare Important Message given?  YES (If response is "NO", the following Medicare IM given date fields will be blank) Date Medicare IM given:  07/29/2014 Medicare IM given by:  Sylar Voong Date Additional Medicare IM given:  08/02/2014 Additional Medicare IM given by:  Sidney Ace  Discharge Disposition:  HOME [facility] SERVICES  Per UR Regulation:  Reviewed for med. necessity/level of care/duration of stay  If discussed at Long Length of Stay Meetings, dates discussed:    Comments:  08/02/14 Sidney Ace, RN, BSN [phone] Returned to room to discuss dc plans.  Friend at bedside--pt dressed.  Pt very angry with me; states that I have lied to her and told her to "get out of the hospital." I explained that dc is up to the physician, who feels she is medically stable at this time, and at no time did I tell her this.  I gave pt a list of HH agencies for [employer], and pt states she is already active with [facility] for [facility] care. She states she will call them when she needs them.   I stated that I would let them know she is leaving the hospital today.  [facility] notified of pt discharge and new orders; start of care 24-48h post dc date.  08/02/14 Sidney Ace, RN, BSN [phone] Pt medically stable for dc today, and pt cont to refuse SNF.  Pt's son is a Naval architect and is currently in [place] on a job.  Pt states she just needs "a few more days" and she will be able to go home.  Explained that pt's insurance may not pay after this date, as she has been dc, and that if she feels she cannot care for herself at home, SNF may be the best option until her son gets home.    Pt states she would like to think about while she eats lunch.  Will return after lunch to finalize dc plans.  07/30/14 Sidney Ace, RN, BSN [phone] CIR has declined admission to rehab, and pt refusing SNF. Will need HH follow up at dc.  Will need orders for [facility] follow up upon dc, with face to face documentation.  CM will arrange.  07/29/14 Sidney Ace, RN, BSN [phone] PT/OT recommending SNF vs CIR ...will consult CSW to discuss poss SNF with pt.  MD, consider rehab  consult.

## 2014-07-27 NOTE — Progress Notes (Signed)
CRITICAL VALUE ALERT  Critical value received:  Ca 6.3  Date of notification:  07/27/2014  Time of notification:  0720  Critical value read back:Yes.    Nurse who received alert:  Alphonzo Dublin RN  MD notified:  Osvaldo Shipper  Time MD notifed:  [phone]

## 2014-07-28 DIAGNOSIS — J9600 Acute respiratory failure, unspecified whether with hypoxia or hypercapnia: Secondary | ICD-10-CM

## 2014-07-28 LAB — COMPREHENSIVE METABOLIC PANEL
ALK PHOS: 109 U/L (ref 39–117)
ALT: 16 U/L (ref 0–35)
ANION GAP: 13 (ref 5–15)
AST: 22 U/L (ref 0–37)
Albumin: 1.8 g/dL — ABNORMAL LOW (ref 3.5–5.2)
BUN: 7 mg/dL (ref 6–23)
CALCIUM: 7.2 mg/dL — AB (ref 8.4–10.5)
CO2: 13 meq/L — AB (ref 19–32)
Chloride: 117 mEq/L — ABNORMAL HIGH (ref 96–112)
Creatinine, Ser: 0.66 mg/dL (ref 0.50–1.10)
GLUCOSE: 67 mg/dL — AB (ref 70–99)
Potassium: 4.1 mEq/L (ref 3.7–5.3)
SODIUM: 143 meq/L (ref 137–147)
Total Bilirubin: 0.3 mg/dL (ref 0.3–1.2)
Total Protein: 4.4 g/dL — ABNORMAL LOW (ref 6.0–8.3)

## 2014-07-28 LAB — TYPE AND SCREEN
ABO/RH(D): B POS
Antibody Screen: NEGATIVE
Unit division: 0
Unit division: 0

## 2014-07-28 LAB — HEMOGLOBIN AND HEMATOCRIT, BLOOD
HCT: 37.1 % (ref 36.0–46.0)
HEMOGLOBIN: 12 g/dL (ref 12.0–15.0)

## 2014-07-28 LAB — CBC
HEMATOCRIT: 35.7 % — AB (ref 36.0–46.0)
HEMOGLOBIN: 12 g/dL (ref 12.0–15.0)
MCH: 29.1 pg (ref 26.0–34.0)
MCHC: 33.6 g/dL (ref 30.0–36.0)
MCV: 86.4 fL (ref 78.0–100.0)
Platelets: 227 10*3/uL (ref 150–400)
RBC: 4.13 MIL/uL (ref 3.87–5.11)
RDW: 14.6 % (ref 11.5–15.5)
WBC: 8.7 10*3/uL (ref 4.0–10.5)

## 2014-07-28 LAB — GLUCOSE, CAPILLARY
GLUCOSE-CAPILLARY: 72 mg/dL (ref 70–99)
GLUCOSE-CAPILLARY: 90 mg/dL (ref 70–99)
Glucose-Capillary: 92 mg/dL (ref 70–99)

## 2014-07-28 LAB — FOLATE RBC: RBC FOLATE: 913 ng/mL — AB (ref >280)

## 2014-07-28 MED ORDER — VANCOMYCIN HCL IN DEXTROSE 1-5 GM/200ML-% IV SOLN
1000.0000 mg | INTRAVENOUS | Status: DC
  Administered 2014-07-28 – 2014-07-30 (×2): 1000 mg via INTRAVENOUS
  Filled 2014-07-28 (×3): qty 200

## 2014-07-28 MED ORDER — LORAZEPAM 2 MG/ML IJ SOLN: 0.5000 mg | Freq: Once | INTRAMUSCULAR | Status: DC

## 2014-07-28 NOTE — Progress Notes (Signed)
[facility] PROGRESS NOTE Subjective patient sleeping peacefully. She said she ate well today. MRCP is still pending. C. difficile is negative stool culture G.I. pathogen panel negative.  Objective: Vital signs in last 24 hours: Temp:  [97.2 F (36.2 C)-98.2 F (36.8 C)] 98.2 F (36.8 C) (10/28 1345) Pulse Rate:  [70-85] 75 (10/28 1345) Resp:  [18-20] 18 (10/28 1345) BP: (93-118)/(61-82) 118/80 mmHg (10/28 1345) SpO2:  [100 %] 100 % (10/28 1345) Weight:  [40.3 kg (88 lb 13.5 oz)] 40.3 kg (88 lb 13.5 oz) (10/28 0500) Last BM Date: 07/27/14  Intake/Output from previous day: 10/27 0701 - 10/28 0700 In: 1327.7 [P.O.:480; I.V.:50; Blood:797.7] Out: 3 [Urine:1; Stool:2] Intake/Output this shift: Total I/O In: 480 [P.O.:480] Out: 201 [Urine:200; Stool:1]  PE: General--patient sleeping peacefully in no distress  Lungs--clear Abdomen-- soft nontender  Lab Results:  Recent Labs  07/26/14 0945 07/27/14 0500 07/27/14 2355 07/28/14 0440  WBC 8.8 6.4  --  8.7  HGB 8.7* 6.5* 12.0 12.0  HCT 26.7* 20.2* 37.1 35.7*  PLT 384 285  --  227   BMET  Recent Labs  07/26/14 0945 07/27/14 0500 07/28/14 0440  NA 141 142 143  K 3.7 4.3 4.1  CL 106 115* 117*  CO2 20 16* 13*  CREATININE 0.80 0.78 0.66   LFT  Recent Labs  07/26/14 0945 07/27/14 0500 07/28/14 0440  PROT 5.3* 4.0* 4.4*  AST 32 20 22  ALT 24 17 16   ALKPHOS 155* 105 109  BILITOT <0.2* <0.2* 0.3   PT/INR No results found for this basename: LABPROT, INR,  in the last 72 hours PANCREAS No results found for this basename: LIPASE,  in the last 72 hours       Studies/Results: X-ray Chest Pa And Lateral  07/27/2014   CLINICAL DATA:  Dyspnea, personal history of diabetes mellitus, hypertension, asthma, Crohn's disease  EXAM: CHEST  2 VIEW  COMPARISON:  Portable exam 0612 hr compared to 07/26/2014  FINDINGS: Normal heart size, mediastinal contours, and pulmonary vascularity.  Peribronchial thickening  and minimal hyperinflation consistent with history of asthma.  No acute infiltrate, pleural effusion or pneumothorax.  Diffuse osseous demineralization.  IMPRESSION: Peribronchial thickening and hyperinflation consistent with history of asthma.  No acute infiltrate.   Electronically Signed   By: Ulyses Southward M.D.   On: 07/27/2014 08:02    Medications: I have reviewed the patient's current medications.  Assessment/Plan: 1. Malnutrition. Patient had abnormality on previous studies of the pancreas and waiting for MRCP. So far stool workup to not show any infection. Pancreatic last day still pending. She does still seem confused for unclear reasons.    [name] JR,[name] L 07/28/2014, 4:16 PM
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
## 2014-07-28 NOTE — Progress Notes (Signed)
OT Cancellation Note  Patient Details Name: [name] MRN: [id number] DOB: 09-20-1961   Cancelled Treatment:     Pt says she does not want to get up and is in pain-pt seemed very irritated. Will check back tomorrow.  Earlie Raveling OTR/L [phone] 07/28/2014, 2:22 PM

## 2014-07-28 NOTE — Progress Notes (Signed)
PT Cancellation Note  Patient Details Name: [name] NewerSylvia S Fretwell MRN: [id number] DOB: 02/23/1961   Cancelled Treatment:    Reason Eval/Treat Not Completed: Patient declined, no reason specified Patient unwilling to participate in physical therapy evaluation this AM. States "Why you so rude when I ask for a hot minute. Everybody trying to tell me what to do up here." Explained importance of working with physical therapy for care planning and to set functional goals. Pt states "You can go see someone else then, I'm busy."   Will attempt follow up for evaluation as time allows.   [address]Log[address] Secor HearneBarbour, South CarolinaPT [phone]   [place] Mount[place]bour, Abriel Hattery S 07/28/2014, 10:16 AM

## 2014-07-29 ENCOUNTER — Inpatient Hospital Stay (HOSPITAL_COMMUNITY): Payer: Medicare Other

## 2014-07-29 DIAGNOSIS — K868 Other specified diseases of pancreas: Secondary | ICD-10-CM

## 2014-07-29 DIAGNOSIS — E876 Hypokalemia: Secondary | ICD-10-CM

## 2014-07-29 LAB — CBC
HCT: 34.1 % — ABNORMAL LOW (ref 36.0–46.0)
Hemoglobin: 11.2 g/dL — ABNORMAL LOW (ref 12.0–15.0)
MCH: 28.7 pg (ref 26.0–34.0)
MCHC: 32.8 g/dL (ref 30.0–36.0)
MCV: 87.4 fL (ref 78.0–100.0)
Platelets: 226 10*3/uL (ref 150–400)
RBC: 3.9 MIL/uL (ref 3.87–5.11)
RDW: 15.1 % (ref 11.5–15.5)
WBC: 9.9 10*3/uL (ref 4.0–10.5)

## 2014-07-29 LAB — GLUCOSE, CAPILLARY
GLUCOSE-CAPILLARY: 69 mg/dL — AB (ref 70–99)
GLUCOSE-CAPILLARY: 77 mg/dL (ref 70–99)
Glucose-Capillary: 60 mg/dL — ABNORMAL LOW (ref 70–99)
Glucose-Capillary: 142 mg/dL — ABNORMAL HIGH (ref 70–99)
Glucose-Capillary: 114 mg/dL — ABNORMAL HIGH (ref 70–99)

## 2014-07-29 LAB — BASIC METABOLIC PANEL
Anion gap: 12 (ref 5–15)
BUN: 8 mg/dL (ref 6–23)
CO2: 13 meq/L — AB (ref 19–32)
Calcium: 7.2 mg/dL — ABNORMAL LOW (ref 8.4–10.5)
Chloride: 117 mEq/L — ABNORMAL HIGH (ref 96–112)
Creatinine, Ser: 0.64 mg/dL (ref 0.50–1.10)
Glucose, Bld: 126 mg/dL — ABNORMAL HIGH (ref 70–99)
Potassium: 3.4 mEq/L — ABNORMAL LOW (ref 3.7–5.3)
SODIUM: 142 meq/L (ref 137–147)

## 2014-07-29 MED ORDER — DEXTROSE 50 % IV SOLN
12.5000 g | Freq: Once | INTRAVENOUS | Status: AC
  Administered 2014-07-29: 12.5 g via INTRAVENOUS
  Filled 2014-07-29: qty 50

## 2014-07-29 MED ORDER — LEVOFLOXACIN IN D5W 750 MG/150ML IV SOLN
750.0000 mg | INTRAVENOUS | Status: DC
Start: 2014-07-29 — End: 2014-07-30
  Administered 2014-07-29 – 2014-07-30 (×2): 750 mg via INTRAVENOUS
  Filled 2014-07-29 (×2): qty 150

## 2014-07-29 MED ORDER — DEXTROSE 50 % IV SOLN
INTRAVENOUS | Status: AC
  Filled 2014-07-29: qty 50

## 2014-07-29 MED ORDER — ALPRAZOLAM 0.5 MG PO TABS
2.0000 mg | ORAL_TABLET | Freq: Three times a day (TID) | ORAL | Status: DC | PRN
  Administered 2014-07-29 – 2014-08-02 (×8): 2 mg via ORAL
  Filled 2014-07-29 (×8): qty 4

## 2014-07-29 MED ORDER — DEXTROSE-NACL 5-0.9 % IV SOLN
INTRAVENOUS | Status: DC
  Administered 2014-07-29 – 2014-07-31 (×3): via INTRAVENOUS

## 2014-07-29 MED ORDER — POTASSIUM CHLORIDE CRYS ER 20 MEQ PO TBCR
40.0000 meq | EXTENDED_RELEASE_TABLET | Freq: Once | ORAL | Status: AC
  Administered 2014-07-29: 40 meq via ORAL
  Filled 2014-07-29: qty 2

## 2014-07-29 MED ORDER — LORAZEPAM 2 MG/ML IJ SOLN
2.0000 mg | Freq: Once | INTRAMUSCULAR | Status: AC
  Administered 2014-07-29: 2 mg via INTRAVENOUS
  Filled 2014-07-29: qty 1

## 2014-07-29 MED ORDER — ALPRAZOLAM 0.5 MG PO TABS
1.0000 mg | ORAL_TABLET | Freq: Once | ORAL | Status: AC
  Administered 2014-07-29: 1 mg via ORAL
  Filled 2014-07-29: qty 2

## 2014-07-29 NOTE — Evaluation (Signed)
Occupational Therapy Evaluation Patient Details Name: [name] MRN: [id number] DOB: 03/05/1961 Today's Date: 07/29/2014    History of Present Illness Admitted with c/o cough, dyspnea, chest pain, nausea, vomiting and diarrhea.  H/o DM 2, HTN, Crohn's Disease, hepatitis C, alcohol induced chronic pancreatitis, chronic abdominal pain, anxiety/depression, essential tremors, severe protein calorie malnutrition, hospitalized 8/7-8/14/15 for SIRS (Systematic Inflammatory Response Syndrome)with same symptoms as this admission. She states that she has been having intermittent left sided chest pain underneath the breast since third week of September with radiation to left side of back. This is made worse by chest wall movements, deep inspiration and when her son picks her up to move her from second floor to first floor of her house. This pain seemed to get worse on Friday 07/23/14 when her son was moving her.    Clinical Impression   Pt admitted with above complaints-additional testing to occur this pm.. Pt currently with functional limitations due to the deficits listed below (see OT Problem List). Pt initially difficult to engage and agree to OT evaluation.  Pt moves very slow and wants to perform at here own pace, easily gets off topic and feels staff is rude if they try to "rush her" and redirect her.  She reports that her son has been carrying her up and down the stairs of her apartment for at least 1-2 months and she needed assistance with all BADL and mobility.  She has an aide "most days for about 45 min" and her son is not always at home.  Unsure if back to this 2 level apartment with intermittent assistance is best discharge plan. Difficult to get full picture due to tangential conversation and unsure if good historian. Pt will benefit from skilled OT to increase their safety and independence with ADL and functional mobility for ADL to facilitate discharge to venue listed below. SNF level of  care/rehab is reccommended secondary to unsure patient is good candidate for CIR level of therapy.  She has been to a SNF and on CIR in the past therefore she is refusing SNF and wants to go to CIR.        Follow Up Recommendations  SNF;CIR;Supervision/Assistance - 24 hour (refused SNF as a consideration)   Equipment Recommendations  None recommended by OT (patient reports that she has a shower chair and 3 in 1)    Precautions / Restrictions Precautions Precautions: Fall Restrictions Weight Bearing Restrictions: No      Mobility Bed Mobility Overal bed mobility: Needs Assistance Bed Mobility: Supine to Sit     Supine to sit: Supervision;HOB elevated     General bed mobility comments: Required extra time to complete tasks  Transfers Overall transfer level: Needs assistance Equipment used: None Transfers: Squat Pivot Transfers     Squat pivot transfers: Max assist     General transfer comment: assist to lift and lower, hand placement and sequencing    Balance Overall balance assessment: Needs assistance Sitting-balance support: Feet supported;No upper extremity supported;Bilateral upper extremity supported;Single extremity supported Sitting balance-Leahy Scale: Fair       ADL Overall ADL's : Needs assistance/impaired     Grooming: Wash/dry face;Oral care;Set up;Sitting Grooming Details (indicate cue type and reason): Required increased time      Lower Body Dressing Details (indicate cue type and reason): Donned gripper socks with set up seated EOB Toilet Transfer: Maximal assistance;BSC;Squat-pivot Toilet Transfer Details (indicate cue type and reason): required extra time Toileting- Clothing Manipulation and Hygiene: Minimal assistance;Sit to/from  stand Toileting - Clothing Manipulation Details (indicate cue type and reason): assist to manage hosp gown, required extra time   General ADL Comments: Patient often begins to talk off topic, does not like to be  interrupted for redirection, requires extra time to complete all tasks due to rib pain and headache, weakness, and decreased cognition (? if behavior due to blood sugar checked near end of session - 60)      Pertinent Vitals/Pain Pain Assessment: 0-10 Pain Score: 10-Worst pain ever Pain Location: ribs on left Pain Descriptors / Indicators: Crying;Discomfort;Shooting;Sore;Sharp Pain Intervention(s): Limited activity within patient's tolerance;Monitored during session;Patient requesting pain meds-RN notified;Repositioned (rest breaks)     Hand Dominance Right   Extremity/Trunk Assessment Upper Extremity Assessment Upper Extremity Assessment: Generalized weakness   Lower Extremity Assessment Lower Extremity Assessment: Defer to PT evaluation   Cervical / Trunk Assessment Cervical / Trunk Assessment: Kyphotic   Communication Communication Communication: No difficulties;Other (comment) (takes conversation off topic and requires max cues to  redirect.  PAtient considers redirection as rude.)   Cognition Arousal/Alertness: Awake/alert;Lethargic Behavior During Therapy: WFL for tasks assessed/performed;Agitated;Anxious Overall Cognitive Status: No family/caregiver present to determine baseline cognitive functioning (easily agitated when she perceives people are rude or things don't happen when she feels they should.)      Home Living Family/patient expects to be discharged to:: Private residence Living Arrangements: Children Available Help at Discharge: Family;Available PRN/intermittently (home alone few hours per day) Type of Home: Apartment   Bathroom Shower/Tub: Tub/shower unit;Curtain Shower/tub characteristics: Engineer, building servicesCurtain Bathroom Toilet: Standard Bathroom Accessibility: Yes How Accessible: Accessible via wheelchair Home Equipment: Walker - 2 wheels;Cane - single point;Bedside commode;Shower seat   Additional Comments: Son not employed however not always at home so patient is  home alone.  She reports she has an aide up to 7 days a week.        Prior Functioning/Environment Level of Independence: Needs assistance  Gait / Transfers Assistance Needed: Reports that her son carries her up and down the stairs for last ~1-1 1/2 months.   ADL's / Homemaking Assistance Needed: Son and aide performs all cooking and cleaning and assists patient with all, BADLs         OT Diagnosis: Generalized weakness;Cognitive deficits;Acute pain   OT Problem List: Decreased strength;Decreased activity tolerance;Impaired balance (sitting and/or standing);Decreased cognition;Pain   OT Treatment/Interventions: Self-care/ADL training;Therapeutic exercise;Energy conservation;Therapeutic activities;Cognitive remediation/compensation;Patient/family education;Balance training    OT Goals(Current goals can be found in the care plan section) Acute Rehab OT Goals Patient Stated Goal: I need to stay in the hospital so I can eat to get stronger and until my pain is better. "Im going upstairs-I'm not going to a nursing home" OT Goal Formulation: With patient Time For Goal Achievement: 08/12/14 Potential to Achieve Goals: Fair  OT Frequency: Min 2X/week   Barriers to D/C: Decreased caregiver support;Inaccessible home environment  Pt states that her son carries her up and down the stairs of their apartment.  Pt states that her son is not always home       End of Session Nurse Communication: Mobility status  Activity Tolerance: Patient limited by pain;Patient limited by fatigue;Treatment limited secondary to agitation Patient left: in bed;with nursing/sitter in room   Time: 1010-1125 OT Time Calculation (min): 75 min Charges:  OT General Charges $OT Visit: 1 Procedure OT Evaluation $Initial OT Evaluation Tier I: 1 Procedure OT Treatments $Self Care/Home Management : 38-52 mins  Annaliese Saez 07/29/2014, 1:38 PM

## 2014-07-29 NOTE — Progress Notes (Signed)
MRI called and stated that procedure was not done. Patient bite one of the staff and stated that ativan did not work. MD made aware.

## 2014-07-29 NOTE — Progress Notes (Signed)
Rehab Admissions Coordinator Note:  Patient was screened by Clois Dupes for appropriateness for an Inpatient Acute Rehab Consult per OT assessment. Pt previously admitted to inpt rehab 05/2014 and went home at Mod I level. If pt would work well with therapy on acute you could consider an inpt rehab consultation to assess if pt would be a candidate to admit.   Clois Dupes 07/29/2014, 2:59 PM  I can be reached at [phone].

## 2014-07-29 NOTE — Progress Notes (Signed)
[facility] PROGRESS NOTE Subjective patient states that she is eating well and met her diarrhea is improving. She seems to be somewhat unclear about what is going on. MRCP is still pending. C. difficile negative, G.I. pathogen panel negative  Objective: Vital signs in last 24 hours: Temp:  [98.2 F (36.8 C)-98.3 F (36.8 C)] 98.3 F (36.8 C) (10/29 0418) Pulse Rate:  [75-93] 93 (10/29 0418) Resp:  [18] 18 (10/29 0418) BP: (118-127)/(80-89) 119/89 mmHg (10/29 0418) SpO2:  [100 %] 100 % (10/29 0418) Weight:  [41.867 kg (92 lb 4.8 oz)] 41.867 kg (92 lb 4.8 oz) (10/29 0418) Last BM Date: 07/28/14  Intake/Output from previous day: 10/28 0701 - 10/29 0700 In: 483 [P.O.:480; I.V.:3] Out: 204 [Urine:201; Stool:3] Intake/Output this shift:    PE: General--alert and oriented with a rambling speech.  Abdomen-- soft and nontender  Lab Results:  Recent Labs  07/26/14 0945 07/27/14 0500 07/27/14 2355 07/28/14 0440 07/29/14 0353  WBC 8.8 6.4  --  8.7 9.9  HGB 8.7* 6.5* 12.0 12.0 11.2*  HCT 26.7* 20.2* 37.1 35.7* 34.1*  PLT 384 285  --  227 226   BMET  Recent Labs  07/26/14 0945 07/27/14 0500 07/28/14 0440 07/29/14 0353  NA 141 142 143 142  K 3.7 4.3 4.1 3.4*  CL 106 115* 117* 117*  CO2 20 16* 13* 13*  CREATININE 0.80 0.78 0.66 0.64   LFT  Recent Labs  07/26/14 0945 07/27/14 0500 07/28/14 0440  PROT 5.3* 4.0* 4.4*  AST 32 20 22  ALT 24 17 16   ALKPHOS 155* 105 109  BILITOT <0.2* <0.2* 0.3   PT/INR No results found for this basename: LABPROT, INR,  in the last 72 hours PANCREAS No results found for this basename: LIPASE,  in the last 72 hours       Studies/Results: No results found.  Medications: I have reviewed the patient's current medications.  Assessment/Plan: 1. SIRS. Etiology unclear. Patient on broad-spectrum antibiotics. Not clear if this is G.I. origin or not. 2. Pancreatic abnormality on CT. MRCP pending 3. Diarrhea. Seems to  be better for unclear reasons were cut negative so far. Due to her history of Crohn's disease she will need a colonoscopy on more stable   Talecia Sherlin JR,Jaymz Traywick L 07/29/2014, 8:57 AM

## 2014-07-29 NOTE — Progress Notes (Signed)
PT Cancellation Note  Patient Details Name: [name] MRN: [id number] DOB: 06-Jan-1961   Cancelled Treatment:    Reason Eval/Treat Not Completed: Fatigue/lethargy limiting ability to participate Patient cannot remain awake for physical therapy evaluation. Continues to drift off while attempting to speak to patient. Will follow up as time allows for PT evaluation.  [address] [place], [place] [phone]   [name] 07/29/2014, 3:48 PM
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
## 2014-07-30 ENCOUNTER — Inpatient Hospital Stay (HOSPITAL_COMMUNITY): Payer: Medicare Other

## 2014-07-30 DIAGNOSIS — R1031 Right lower quadrant pain: Secondary | ICD-10-CM

## 2014-07-30 DIAGNOSIS — R1032 Left lower quadrant pain: Secondary | ICD-10-CM

## 2014-07-30 DIAGNOSIS — R071 Chest pain on breathing: Secondary | ICD-10-CM

## 2014-07-30 LAB — BASIC METABOLIC PANEL
ANION GAP: 12 (ref 5–15)
BUN: 6 mg/dL (ref 6–23)
CHLORIDE: 120 meq/L — AB (ref 96–112)
CO2: 13 mEq/L — ABNORMAL LOW (ref 19–32)
Calcium: 6.9 mg/dL — ABNORMAL LOW (ref 8.4–10.5)
Creatinine, Ser: 0.66 mg/dL (ref 0.50–1.10)
Glucose, Bld: 182 mg/dL — ABNORMAL HIGH (ref 70–99)
POTASSIUM: 3.3 meq/L — AB (ref 3.7–5.3)
SODIUM: 145 meq/L (ref 137–147)

## 2014-07-30 LAB — STOOL CULTURE

## 2014-07-30 LAB — MAGNESIUM: Magnesium: 1 mg/dL — ABNORMAL LOW (ref 1.5–2.5)

## 2014-07-30 LAB — GLUCOSE, CAPILLARY
GLUCOSE-CAPILLARY: 99 mg/dL (ref 70–99)
Glucose-Capillary: 53 mg/dL — ABNORMAL LOW (ref 70–99)
Glucose-Capillary: 124 mg/dL — ABNORMAL HIGH (ref 70–99)
Glucose-Capillary: 87 mg/dL (ref 70–99)
Glucose-Capillary: 91 mg/dL (ref 70–99)

## 2014-07-30 MED ORDER — HYDROMORPHONE HCL 2 MG PO TABS
1.0000 mg | ORAL_TABLET | ORAL | Status: DC | PRN
  Administered 2014-07-30 – 2014-08-02 (×7): 1 mg via ORAL
  Filled 2014-07-30 (×8): qty 1

## 2014-07-30 MED ORDER — DEXTROSE 50 % IV SOLN
25.0000 mL | Freq: Once | INTRAVENOUS | Status: AC
  Administered 2014-07-30: 25 mL via INTRAVENOUS
  Filled 2014-07-30: qty 50

## 2014-07-30 MED ORDER — DEXTROSE 50 % IV SOLN
INTRAVENOUS | Status: AC
  Filled 2014-07-30: qty 50

## 2014-07-30 MED ORDER — POTASSIUM CHLORIDE CRYS ER 20 MEQ PO TBCR
40.0000 meq | EXTENDED_RELEASE_TABLET | Freq: Two times a day (BID) | ORAL | Status: AC
  Administered 2014-07-30: 40 meq via ORAL
  Filled 2014-07-30 (×2): qty 2

## 2014-07-30 MED ORDER — HALOPERIDOL LACTATE 5 MG/ML IJ SOLN
2.0000 mg | Freq: Once | INTRAMUSCULAR | Status: AC | PRN
  Filled 2014-07-30: qty 0.8

## 2014-07-30 MED ORDER — GADOBENATE DIMEGLUMINE 529 MG/ML IV SOLN
8.0000 mL | Freq: Once | INTRAVENOUS | Status: AC | PRN
  Administered 2014-07-30: 8 mL via INTRAVENOUS

## 2014-07-30 NOTE — Progress Notes (Signed)
MRCP was attempted yesterday but pt apparently bit the tech is to go back today have asked that she be given some of her PRN xanax when she goes down.

## 2014-07-30 NOTE — Clinical Social Work Note (Signed)
CSW received referral for SNF.  Case discussed with patient who said she will absolutely not go to a snf plan is to discharge home with home health, case manager aware.  CSW to sign off please re-consult if social work needs arise.  Ervin Knack. Jari Carollo, MSW, [employer] [phone]

## 2014-07-30 NOTE — Progress Notes (Signed)
ANTIBIOTIC CONSULT NOTE - FOLLOW UP  Pharmacy Consult for Vancomycin, Levofloxacin Indication: R/O sepsis  Patient Measurements: Height: 5' 4.96" (165 cm) Weight: 93 lb 3.2 oz (42.275 kg) IBW/kg (Calculated) : 56.91 Adjusted Body Weight:   Vital Signs: Temp: 97.2 F (36.2 C) (10/30 0520) Temp Source: Oral (10/30 0520) BP: 121/87 mmHg (10/30 0520) Pulse Rate: 93 (10/30 0520) Intake/Output from previous day: 10/29 0701 - 10/30 0700 In: 360 [P.O.:360] Out: 8 [Urine:4; Stool:4] Intake/Output from this shift:    Labs:  Recent Labs  07/27/14 2355 07/28/14 0440 07/29/14 0353 07/30/14 0248  WBC  --  8.7 9.9  --   HGB 12.0 12.0 11.2*  --   PLT  --  227 226  --   CREATININE  --  0.66 0.64 0.66   Estimated Creatinine Clearance: 53.1 ml/min (by C-G formula based on Cr of 0.66). No results found for this basename: VANCOTROUGH, VANCOPEAK, VANCORANDOM, GENTTROUGH, GENTPEAK, GENTRANDOM, TOBRATROUGH, TOBRAPEAK, TOBRARND, AMIKACINPEAK, AMIKACINTROU, AMIKACIN,  in the last 72 hours   Microbiology: Recent Results (from the past 720 hour(s))  CULTURE, BLOOD (ROUTINE X 2)     Status: None   Collection Time    07/26/14 11:45 AM      Result Value Ref Range Status   Specimen Description BLOOD LEFT HAND   Final   Special Requests BOTTLES DRAWN AEROBIC AND ANAEROBIC   Final   Culture  Setup Time     Final   Value: 07/26/2014 18:15     Performed at [facility]   Culture     Final   Value:        BLOOD CULTURE RECEIVED NO GROWTH TO DATE CULTURE WILL BE HELD FOR 5 DAYS BEFORE ISSUING A FINAL NEGATIVE REPORT     Performed at [facility]   Report Status PENDING   Incomplete  URINE CULTURE     Status: None   Collection Time    07/26/14 12:02 PM      Result Value Ref Range Status   Specimen Description URINE, CATHETERIZED   Final   Special Requests NONE   Final   Culture  Setup Time     Final   Value: 07/26/2014 21:04     Performed at [facility]  Count     Final   Value: NO GROWTH     Performed at [facility]   Culture     Final   Value: NO GROWTH     Performed at [facility]   Report Status 07/27/2014 FINAL   Final  CULTURE, BLOOD (ROUTINE X 2)     Status: None   Collection Time    07/26/14  4:42 PM      Result Value Ref Range Status   Specimen Description BLOOD LEFT ANTECUBITAL   Final   Special Requests BOTTLES DRAWN AEROBIC AND ANAEROBIC 10CC   Final   Culture  Setup Time     Final   Value: 07/26/2014 21:12     Performed at [facility]   Culture     Final   Value:        BLOOD CULTURE RECEIVED NO GROWTH TO DATE CULTURE WILL BE HELD FOR 5 DAYS BEFORE ISSUING A FINAL NEGATIVE REPORT     Performed at [facility]   Report Status PENDING   Incomplete  CLOSTRIDIUM DIFFICILE BY PCR     Status: None   Collection Time    07/26/14  4:42 PM      Result Value Ref Range Status   C difficile by pcr NEGATIVE  NEGATIVE Final  STOOL CULTURE     Status: None   Collection Time    07/26/14  4:42 PM      Result Value Ref Range Status   Specimen Description STOOL   Final   Special Requests NONE   Final   Culture     Final   Value: NO SALMONELLA, SHIGELLA, CAMPYLOBACTER, YERSINIA, OR E.COLI 0157:H7 ISOLATED     [facility]   Report Status 07/30/2014 FINAL   Final    Assessment: 53yo female on Aztreonam, Levofloxacin, and Vancomycin for R/O sepsis.  Cultures are no growth to date.  Discussed on progression rounds - may be able to stop antibiotics today  Goal of Therapy:  Vancomycin trough level 15-20 mcg/ml  Plan:  Change levofloxacin to 750mg  IV q48 Change Vancomycin to 750mg  IV q24 F/U cultures and trend clinical status Vancomycin trough when appropriate.  Will defer for now as antibiotics may be stopped today.  Celedonio MiyamotoJeremy Calani Gick, PharmD, BCPS Clinical Pharmacist Pager [phone]

## 2014-07-30 NOTE — Progress Notes (Signed)
PT Cancellation Note  Patient Details Name: [name] MRN: [id number] DOB: [birth year]   Cancelled Treatment:    Reason Eval/Treat Not Completed: Fatigue/lethargy limiting ability to participate; patient lethargic s/p procedure and sedating medication given.  Will attempt one more time to evaluate patient prior to discharging per policy due to 3 refusals.   WYNN,CYNDI 07/30/2014, 4:32 PM
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
## 2014-07-30 NOTE — Progress Notes (Signed)
[facility].  Formal consult deferred Pt with poor participation in therapy as well as poor tolerance.  Recommend less intensive venue such as SNF for PT/OT for deconditioning.

## 2014-07-30 NOTE — Progress Notes (Addendum)
Inpatient Diabetes Program Recommendations  AACE/ADA: New Consensus Statement on Inpatient Glycemic Control (2013)  Target Ranges:  Prepandial:   less than 140 mg/dL      Peak postprandial:   less than 180 mg/dL (1-2 hours)      Critically ill patients:  140 - 180 mg/dL   Reason for Visit: Low blood sugars:  69, 60,142,77,114,53,124  Current orders for Inpatient glycemic control:Sensitive Correction scale tid.  Recommend holding insulin orders, but continue CBGs.     Mellissa KohutSherrie Evy Lutterman RD, CDE. M.Ed. Pager [phone] Inpatient Diabetes Coordinator

## 2014-07-30 NOTE — Progress Notes (Signed)
I have notified RN CM and SW that pt is not a candidate to be admitted to inpt rehab again at this time. Please call me for any questions. [phone]

## 2014-07-31 LAB — TSH: TSH: 0.935 u[IU]/mL (ref 0.350–4.500)

## 2014-07-31 LAB — BASIC METABOLIC PANEL
ANION GAP: 13 (ref 5–15)
BUN: 4 mg/dL — AB (ref 6–23)
CHLORIDE: 122 meq/L — AB (ref 96–112)
CO2: 14 mEq/L — ABNORMAL LOW (ref 19–32)
Calcium: 7.4 mg/dL — ABNORMAL LOW (ref 8.4–10.5)
Creatinine, Ser: 0.61 mg/dL (ref 0.50–1.10)
GFR calc non Af Amer: >90 mL/min (ref >90)
Glucose, Bld: 104 mg/dL — ABNORMAL HIGH (ref 70–99)
POTASSIUM: 3.5 meq/L — AB (ref 3.7–5.3)
Sodium: 149 mEq/L — ABNORMAL HIGH (ref 137–147)

## 2014-07-31 LAB — GLUCOSE, CAPILLARY
GLUCOSE-CAPILLARY: 135 mg/dL — AB (ref 70–99)
Glucose-Capillary: 140 mg/dL — ABNORMAL HIGH (ref 70–99)
Glucose-Capillary: 130 mg/dL — ABNORMAL HIGH (ref 70–99)
Glucose-Capillary: 156 mg/dL — ABNORMAL HIGH (ref 70–99)

## 2014-07-31 MED ORDER — MAGNESIUM SULFATE 40 MG/ML IJ SOLN
2.0000 g | Freq: Once | INTRAMUSCULAR | Status: DC
  Filled 2014-07-31: qty 50

## 2014-07-31 MED ORDER — POTASSIUM CHLORIDE CRYS ER 20 MEQ PO TBCR
40.0000 meq | EXTENDED_RELEASE_TABLET | Freq: Once | ORAL | Status: DC
  Filled 2014-07-31: qty 2

## 2014-07-31 MED ORDER — MAGNESIUM OXIDE 400 (241.3 MG) MG PO TABS
200.0000 mg | ORAL_TABLET | Freq: Two times a day (BID) | ORAL | Status: DC
  Filled 2014-07-31 (×2): qty 0.5

## 2014-07-31 MED ORDER — PANCRELIPASE (LIP-PROT-AMYL) 36000-114000 UNITS PO CPEP
36000.0000 [IU] | ORAL_CAPSULE | Freq: Three times a day (TID) | ORAL | Status: DC
  Administered 2014-07-31 – 2014-08-02 (×5): 36000 [IU] via ORAL
  Filled 2014-07-31 (×8): qty 1

## 2014-07-31 NOTE — Progress Notes (Signed)
RN and nurse tech both bathed patient and changed bed linen. Pt was verbally aggressive and disrespectful to nurse throughout the shift. Pt stated that she was unhappy with RN care and will ''choke nurse''. Charge nurse made aware.

## 2014-07-31 NOTE — Evaluation (Signed)
Physical Therapy Evaluation Patient Details Name: [name] MRN: [id number] DOB: August 07, 1961 Today's Date: 07/31/2014   History of Present Illness  Admitted with c/o cough, dyspnea, chest pain, nausea, vomiting and diarrhea.  H/o DM 2, HTN, Crohn's Disease, hepatitis C, alcohol induced chronic pancreatitis, chronic abdominal pain, anxiety/depression, essential tremors, severe protein calorie malnutrition, hospitalized 8/7-8/14/15 for SIRS (Systematic Inflammatory Response Syndrome)with same symptoms as this admission. She states that she has been having intermittent left sided chest pain underneath the breast since third week of September with radiation to left side of back. This is made worse by chest wall movements, deep inspiration and when her son picks her up to move her from second floor to first floor of her house. This pain seemed to get worse on Friday 07/23/14 when her son was moving her.   Clinical Impression  Pt admitted with above. Pt currently with functional limitations due to the deficits listed below (see PT Problem List). Willing to participate in evaluation today, states she wants to go home and will not go to SNF. Required min assist - min guard for transfer. Does not have 24 hour care at home. Pt will benefit from skilled PT to increase their independence and safety with mobility to allow discharge to the venue listed below.      Follow Up Recommendations SNF (Pt refuses SNF - will benefit from HHPT)    Equipment Recommendations  None recommended by PT    Recommendations for Other Services       Precautions / Restrictions Precautions Precautions: Fall Restrictions Weight Bearing Restrictions: No      Mobility  Bed Mobility Overal bed mobility: Needs Assistance Bed Mobility: Supine to Sit     Supine to sit: Supervision     General bed mobility comments: Supervision for safety. Uses bed rail.   Transfers Overall transfer level: Needs assistance Equipment  used: Rolling walker (2 wheeled) Transfers: Sit to/from [employer] Sit to Stand: Min assist Stand pivot transfers: Min guard       General transfer comment: Min assist for boost to stand. VC for hand placement and technique. Min guard with transfer to chair for safety. Able to pivot. VC for walker placement for proximity. Fair control with descent into chair.  Ambulation/Gait                Stairs            Wheelchair Mobility    Modified Rankin (Stroke Patients Only)       Balance Overall balance assessment: Needs assistance Sitting-balance support: No upper extremity supported;Feet supported Sitting balance-Leahy Scale: Fair     Standing balance support: Bilateral upper extremity supported Standing balance-Leahy Scale: Poor                               Pertinent Vitals/Pain Pain Assessment: 0-10 Pain Score: 10-Worst pain ever Pain Location: Ribs on Rt side Pain Descriptors / Indicators: Constant Pain Intervention(s): Monitored during session;Repositioned    Home Living Family/patient expects to be discharged to:: Private residence Living Arrangements: Children Available Help at Discharge: Family;Available PRN/intermittently Type of Home: Apartment Home Access: Stairs to enter Entrance Stairs-Rails: Right;Left Entrance Stairs-Number of Steps: 6 steps with L rail to landing, 6 steps with R rail to apartment Home Layout: One level Home Equipment: Environmental consultant - 2 wheels;Cane - single point;Bedside commode;Shower seat      Prior Function Level of Independence: Needs assistance  Gait / Transfers Assistance Needed: Used walker for ambulation  ADL's / Homemaking Assistance Needed: Needed help for ADLs        Hand Dominance   Dominant Hand: Right    Extremity/Trunk Assessment   Upper Extremity Assessment: Defer to OT evaluation           Lower Extremity Assessment: Generalized weakness         Communication    Communication: No difficulties  Cognition Arousal/Alertness: Awake/alert Behavior During Therapy: WFL for tasks assessed/performed Overall Cognitive Status: No family/caregiver present to determine baseline cognitive functioning                      General Comments      Exercises        Assessment/Plan    PT Assessment Patient needs continued PT services  PT Diagnosis Difficulty walking;Generalized weakness;Acute pain   PT Problem List Decreased strength;Decreased activity tolerance;Decreased balance;Decreased mobility;Decreased knowledge of use of DME;Pain  PT Treatment Interventions DME instruction;Gait training;Stair training;Functional mobility training;Therapeutic activities;Therapeutic exercise;Balance training;Neuromuscular re-education;Patient/family education;Modalities   PT Goals (Current goals can be found in the Care Plan section) Acute Rehab PT Goals Patient Stated Goal: Go home PT Goal Formulation: With patient Time For Goal Achievement: 08/07/14 Potential to Achieve Goals: Good    Frequency Min 3X/week   Barriers to discharge Decreased caregiver support Son not available 24/7    Co-evaluation               End of Session Equipment Utilized During Treatment: Gait belt Activity Tolerance: Patient limited by pain Patient left: in chair;with call bell/phone within reach Nurse Communication: Other (comment) (Nurse tech about to enter room - informed of mobility status)         Time: [id number] PT Time Calculation (min): 16 min   Charges:   PT Evaluation $Initial PT Evaluation Tier I: 1 Procedure     PT G Codes:         Charlsie MerlesLogan Secor Justis Dupas, PT [phone]  [place]erton MountBarbour, Sherlie Boyum S 07/31/2014, 3:39 PM
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
## 2014-07-31 NOTE — Progress Notes (Signed)
Subjective: Multiple complaints (rib pain, abdominal pain, diarrhea, weakness).  Objective: Vital signs in last 24 hours: Temp:  [97.6 F (36.4 C)-97.9 F (36.6 C)] 97.9 F (36.6 C) (10/31 0432) Pulse Rate:  [89-105] 105 (10/31 0432) Resp:  [17] 17 (10/31 0432) BP: (102-132)/(56-94) 132/80 mmHg (10/31 0432) SpO2:  [97 %-100 %] 100 % (10/31 0432) Weight:  [42.275 kg (93 lb 3.2 oz)] 42.275 kg (93 lb 3.2 oz) (10/31 0432) Weight change: 0 kg (0 lb) Last BM Date: 07/30/14  PE: GEN:  Cachectic, depressed mood, flat affect, a bit histrionic at times, avoids eye contact ABD:  Generalized tenderness, soft, non focal, no peritonitis.  Lab Results: CBC    Component Value Date/Time   WBC 9.9 07/29/2014 0353   RBC 3.90 07/29/2014 0353   RBC 3.81* 07/30/2011 1045   HGB 11.2* 07/29/2014 0353   HCT 34.1* 07/29/2014 0353   PLT 226 07/29/2014 0353   MCV 87.4 07/29/2014 0353   MCH 28.7 07/29/2014 0353   MCHC 32.8 07/29/2014 0353   RDW 15.1 07/29/2014 0353   LYMPHSABS 3.8 05/17/2014 0500   MONOABS 1.4* 05/17/2014 0500   EOSABS 0.1 05/17/2014 0500   BASOSABS 0.1 05/17/2014 0500   CMP     Component Value Date/Time   NA 149* 07/31/2014 0448   K 3.5* 07/31/2014 0448   CL 122* 07/31/2014 0448   CO2 14* 07/31/2014 0448   GLUCOSE 104* 07/31/2014 0448   BUN 4* 07/31/2014 0448   CREATININE 0.61 07/31/2014 0448   CALCIUM 7.4* 07/31/2014 0448   PROT 4.4* 07/28/2014 0440   ALBUMIN 1.8* 07/28/2014 0440   AST 22 07/28/2014 0440   ALT 16 07/28/2014 0440   ALKPHOS 109 07/28/2014 0440   BILITOT 0.3 07/28/2014 0440   GFRNONAA >90 07/31/2014 0448   GFRAA >90 07/31/2014 0448   Studies/Results: MRI/MRCP:  No pancreatic mass.  No choledocholithiasis.  Biliary and pancreatic ductal dilatation unchanged since 2012.  Assessment:  1.  Elevated LFTs, now normalized.  Unclear whether the ALP 155 she previously had is of any clinical significance.  No pancreatic or biliary pathology noted on MRCP, degree  of dilatation of pancreatic and bile duct unchanged since 2012.  Narcotics can certainly increase caliber of bile/pancreatic ducts, and chronic pancreatitis can lead to similar findings as well. 2.  Abdominal pain.  Suspect strong anxiety and functional component. 3.  Diarrhea.  C. Diff and stool culture recently negative.  Suspect strong anxiety and functional component.  Carries diagnosis of chronic pancreatitis, so malabsorption from pancreatic insufficiency is a possibility. 4.  Crohn's disease per chart.  Unclear how convincing this diagnosis has been in the past.  Recent CT abdomen August 2015 showed no active Crohn's, and visualized portions of bowel on yesterday's MRI/MRCP showed no bowel inflammation. 5.  Protein calorie malnutrition. 6.  Hypokalemia.  Plan:  1.  Change diet to low fat diet. 2.  Trial of pancreatic enzymes, now and upon discharge. 3.  Patient is on magnesium bid; if this is not necessary to help with her hypokalemia, then I would consider stopping it, since it can be associated with diarrhea. 4.  Recheck her TSH.  Check CRP and fecal lactoferrin. 5.  Minimize her narcotics as clinically feasible; may lead to her somnolence, "brain fog," and can lead to worsening abdominal pain (narcotic bowel syndrome). 6.  Constellation of patient's GI symptoms seem chronic in nature; can accordingly have her follow-up with Dr. Ewing SchleinMagod as outpatient for further input.  I don't feel  any further GI tract testing (colonoscopy, etc.) needs to be done as an inpatient.  7.  Will sign-off; please call with questions; thank you for the consultation.   Freddy Jaksch 07/31/2014, 1:56 PM

## 2014-08-01 ENCOUNTER — Inpatient Hospital Stay (HOSPITAL_COMMUNITY): Payer: Medicare Other

## 2014-08-01 LAB — BASIC METABOLIC PANEL
Anion gap: 11 (ref 5–15)
BUN: 4 mg/dL — AB (ref 6–23)
CHLORIDE: 119 meq/L — AB (ref 96–112)
CO2: 18 meq/L — AB (ref 19–32)
Calcium: 7.4 mg/dL — ABNORMAL LOW (ref 8.4–10.5)
Creatinine, Ser: 0.54 mg/dL (ref 0.50–1.10)
GFR calc Af Amer: >90 mL/min (ref >90)
GLUCOSE: 97 mg/dL (ref 70–99)
POTASSIUM: 3.5 meq/L — AB (ref 3.7–5.3)
Sodium: 148 mEq/L — ABNORMAL HIGH (ref 137–147)

## 2014-08-01 LAB — CULTURE, BLOOD (ROUTINE X 2)
CULTURE: NO GROWTH
Culture: NO GROWTH

## 2014-08-01 LAB — T4, FREE: Free T4: 0.64 ng/dL — ABNORMAL LOW (ref 0.80–1.80)

## 2014-08-01 LAB — C-REACTIVE PROTEIN: CRP: <0.5 mg/dL — ABNORMAL LOW (ref <0.60)

## 2014-08-01 LAB — GLUCOSE, CAPILLARY
GLUCOSE-CAPILLARY: 130 mg/dL — AB (ref 70–99)
GLUCOSE-CAPILLARY: 140 mg/dL — AB (ref 70–99)
Glucose-Capillary: 75 mg/dL (ref 70–99)

## 2014-08-01 NOTE — Progress Notes (Signed)
Patient had been speaking with myself and with the NT for approximately 15 minutes when she stated that she had transferred herself from the commode earlier, sat on her bed, and leaned over to the right, "'bumping my head" on the foot rail.  No signs of injury noted.  MD notified.  Bed alarm placed, but patient refused, stating "I'm not crazy and I know what I'm doing!"  Reinforced need to call for staff.  Patient states she doesn't want everybody "knowing my business".
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
## 2014-08-01 NOTE — Plan of Care (Signed)
Problem: Phase II Progression Outcomes Goal: IV changed to normal saline lock Outcome: Completed/Met Date Met:  08/01/14

## 2014-08-02 LAB — GLUCOSE, CAPILLARY
GLUCOSE-CAPILLARY: 177 mg/dL — AB (ref 70–99)
Glucose-Capillary: 91 mg/dL (ref 70–99)
Glucose-Capillary: 131 mg/dL — ABNORMAL HIGH (ref 70–99)

## 2014-08-02 MED ORDER — BOOST / RESOURCE BREEZE PO LIQD: 1.0000 | Freq: Three times a day (TID) | ORAL | Status: DC

## 2014-08-02 MED ORDER — TRAZODONE 25 MG HALF TABLET: 25.0000 mg | ORAL_TABLET | Freq: Every evening | ORAL | Status: DC | PRN

## 2014-08-02 MED ORDER — PANCRELIPASE (LIP-PROT-AMYL) 36000-114000 UNITS PO CPEP: 36000.0000 [IU] | ORAL_CAPSULE | Freq: Three times a day (TID) | ORAL | Status: DC

## 2014-08-02 MED ORDER — POTASSIUM CHLORIDE CRYS ER 20 MEQ PO TBCR
40.0000 meq | EXTENDED_RELEASE_TABLET | Freq: Once | ORAL | Status: AC
  Administered 2014-08-02: 40 meq via ORAL
  Filled 2014-08-02: qty 2

## 2014-08-02 MED ORDER — VITAMIN B-1 100 MG PO TABS: 100.0000 mg | ORAL_TABLET | Freq: Every day | ORAL | Status: DC

## 2014-08-02 MED ORDER — SULFASALAZINE 500 MG PO TABS: 1000.0000 mg | ORAL_TABLET | Freq: Every day | ORAL | Status: DC

## 2014-08-02 MED ORDER — HYDROMORPHONE HCL 2 MG PO TABS: 1.0000 mg | ORAL_TABLET | Freq: Four times a day (QID) | ORAL | Status: DC | PRN

## 2014-08-02 MED ORDER — SUCRALFATE 1 G PO TABS: 1.0000 g | ORAL_TABLET | Freq: Two times a day (BID) | ORAL | Status: DC

## 2014-08-02 MED ORDER — FOLIC ACID 1 MG PO TABS: 1.0000 mg | ORAL_TABLET | Freq: Every day | ORAL | Status: DC

## 2014-08-02 MED ORDER — GABAPENTIN 800 MG PO TABS: 800.0000 mg | ORAL_TABLET | Freq: Three times a day (TID) | ORAL | Status: DC

## 2014-08-02 MED ORDER — ROSUVASTATIN CALCIUM 20 MG PO TABS: 20.0000 mg | ORAL_TABLET | Freq: Every day | ORAL | Status: DC

## 2014-08-02 MED ORDER — PANTOPRAZOLE SODIUM 20 MG PO TBEC: 40.0000 mg | DELAYED_RELEASE_TABLET | Freq: Every day | ORAL | Status: DC

## 2014-08-02 MED ORDER — VENLAFAXINE HCL 25 MG PO TABS: 25.0000 mg | ORAL_TABLET | Freq: Two times a day (BID) | ORAL | Status: DC

## 2014-08-02 MED ORDER — APIXABAN 5 MG PO TABS: 5.0000 mg | ORAL_TABLET | Freq: Two times a day (BID) | ORAL | Status: DC

## 2014-08-02 MED ORDER — FREE WATER: 200.0000 mL | Freq: Three times a day (TID) | Status: DC

## 2014-08-02 MED ORDER — ALPRAZOLAM 2 MG PO TABS: 1.0000 mg | ORAL_TABLET | Freq: Three times a day (TID) | ORAL | Status: DC | PRN

## 2014-08-02 MED ORDER — LEVOTHYROXINE SODIUM 25 MCG PO TABS: 25.0000 ug | ORAL_TABLET | Freq: Every day | ORAL | Status: DC

## 2014-08-02 NOTE — Clinical Social Work Note (Signed)
CSW received referral for SNF.  Case discussed with patient who stated she will not go to a SNF.  Patient stated she has been at one in the past, and did not like it.  Suggested that she try a different facility and patient still said no.  Plan is to discharge home.  CSW to sign off please re-consult if social work needs arise.  Ervin Knack. Latha Staunton, MSW, [employer] [phone]

## 2014-08-02 NOTE — Progress Notes (Signed)
Physical Therapy Treatment Patient Details Name: [name] MRN: [id number] DOB: 1961/09/18 Today's Date: 08/02/2014    History of Present Illness Admitted with c/o cough, dyspnea, chest pain, nausea, vomiting and diarrhea.  H/o DM 2, HTN, Crohn's Disease, hepatitis C, alcohol induced chronic pancreatitis, chronic abdominal pain, anxiety/depression, essential tremors, severe protein calorie malnutrition, hospitalized 8/7-8/14/15 for SIRS (Systematic Inflammatory Response Syndrome)with same symptoms as this admission. She states that she has been having intermittent left sided chest pain underneath the breast since third week of September with radiation to left side of back. This is made worse by chest wall movements, deep inspiration and when her son picks her up to move her from second floor to first floor of her house. This pain seemed to get worse on Friday 07/23/14 when her son was moving her.     PT Comments    Pt asleep upon entering room and after several attempts able to awaken pt though she was not happy. Pt generally irritated throughout session and not particularly agreeable to therapy. Pt did voice she needed to use the [facility] so assisted pt with this (see details below). Pt refused to sit up in the chair or work on gait/exercises. Pt demonstrating decreased safety awareness and decreased memory during time spent with the patient. Recommending SNF still or 24/7 S/A if pt continues to refuse SNF and goes home (as well as follow up HHPT).   Follow Up Recommendations  SNF     Equipment Recommendations  None recommended by PT    Recommendations for Other Services       Precautions / Restrictions Precautions Precautions: Fall Restrictions Weight Bearing Restrictions: No    Mobility  Bed Mobility Overal bed mobility: Needs Assistance Bed Mobility: Supine to Sit     Supine to sit: Supervision;HOB elevated     General bed mobility comments: extra time and encouragement needed  to participate.  Transfers Overall transfer level: Needs assistance Equipment used: None Transfers: Sit to/from Stand Sit to Stand: Min guard Stand pivot transfers: Min guard       General transfer comment: Min A for balance and safety transferring bed <-> BSC with extra time and maintaining very wide BOS and very flexed posture. Miimal cueing by therapist as pt becoming increasinigly irritated when given direction or cues  Ambulation/Gait             General Gait Details: Pt refused transferring to chair or gait    Stairs            Wheelchair Mobility    Modified Rankin (Stroke Patients Only)       Balance   Sitting-balance support: No upper extremity supported;Feet supported;Single extremity supported (during functional task to don socks on BLE) Sitting balance-Leahy Scale: Good     Standing balance support: Bilateral upper extremity supported;Single extremity supported (maintains very wide BOS) Standing balance-Leahy Scale: Poor                      Cognition Arousal/Alertness: Awake/alert Behavior During Therapy: Restless;Agitated Overall Cognitive Status: No family/caregiver present to determine baseline cognitive functioning (pt quick to become irrirated if things not done her way)       Memory: Decreased short-term memory              Exercises      General Comments        Pertinent Vitals/Pain Pain Assessment: Faces Faces Pain Scale: Hurts little more Pain Location: did not rate  or described. Just stated she hurts. Pain Intervention(s): Monitored during session;Repositioned;RN gave pain meds during session    Home Living                      Prior Function            PT Goals (current goals can now be found in the care plan section) Acute Rehab PT Goals PT Goal Formulation: With patient Time For Goal Achievement: 08/07/14 Potential to Achieve Goals: Good Progress towards PT goals: Progressing toward  goals    Frequency  Min 3X/week    PT Plan Current plan remains appropriate    Co-evaluation             End of Session   Activity Tolerance: Patient limited by lethargy;Treatment limited secondary to agitation Patient left: in bed;with nursing/sitter in room (seated EOB with nursing staff administering medication)     Time: [id number] PT Time Calculation (min): 25 min  Charges:  $Therapeutic Activity: 8-22 mins                    G Codes:      Tedd SiasGray, Jasher Barkan Brescia 08/02/2014, 12:08 PM

## 2014-08-02 NOTE — Progress Notes (Signed)
08/02/2014 4:15 PM Nursing note Discharge avs form, medications already taken today and those due this evening given and explained to patient and sister. Follow up appointments, when to call MD and rx reviewed. No iv access or tele to d/c. Questions and concerns addressed along with [facility] arrangements reviewed. D/c home with sister per orders.  Lillybeth Tal, Blanchard Kelch

## 2014-08-02 NOTE — Progress Notes (Addendum)
NUTRITION FOLLOW UP  DOCUMENTATION CODES Per approved criteria  -Severe malnutrition in the context of chronic illness -Underweight   INTERVENTION: Retail buyer) po TID, each supplement provides 250 kcal and 9 grams of protein RD to follow for nutrition care plan  NUTRITION DIAGNOSIS: Increased nutrient needs related to malnutrition, repletion as evidenced by estimated nutrition needs, ongoing  Goal: Pt to meet >/= 90% of their estimated nutrition needs, met  Monitor:  PO & supplemental intake, weight, labs, I/O's  ASSESSMENT: 53 y.o. female with history of DM 2, HTN, Crohn's disease, hepatitis C, alcohol induced chronic pancreatitis, chronic abdominal pain, anxiety/depression, self-reported essential tremors, severe protein calorie malnutrition, hospitalized between 05/07/14-05/14/14 for SIRS, nausea, vomiting and diarrhea, acute bilateral PE, syncope & AKI followed by CIR admission 05/14/14-05/21/14 for deconditioning.    Presented to ED with complaints of cough, dyspnea, chest pain, nausea, vomiting and diarrhea.  Pt continues on a Heart Healthy diet.  PO intake variable at 50-75% per flowsheet records.  GI note 10/31 reviewed.  Suspect abdominal pain & diarrhea are anxiety related.  Pt is drinking her Resource Breeze supplements.  Height: Ht Readings from Last 1 Encounters:  07/27/14 5' 4.96" (1.65 m)    Weight: Wt Readings from Last 1 Encounters:  08/02/14 95 lb 8 oz (43.319 kg)    Body mass index is 15.91 kg/(m^2).  Estimated Nutritional Needs: Kcal: 1600-1800 Protein: 80-90 gm Fluid: 1.6-1.8 L  Skin: Intact  Diet Order: Heart Healthy   Labs:   Recent Labs Lab 07/30/14 0248 07/31/14 0448 08/01/14 1345  NA 145 149* 148*  K 3.3* 3.5* 3.5*  CL 120* 122* 119*  CO2 13* 14* 18*  BUN 6 4* 4*  CREATININE 0.66 0.61 0.54  CALCIUM 6.9* 7.4* 7.4*  MG 1.0*  --   --   GLUCOSE 182* 104* 97    Scheduled Meds: . sodium chloride    Intravenous Once  . apixaban  5 mg Oral BID  . calcium carbonate  2 tablet Oral BID  . feeding supplement (RESOURCE BREEZE)  1 Container Oral TID BM  . folic acid  1 mg Oral Daily  . free water  200 mL Per Tube 3 times per day  . gabapentin  800 mg Oral TID  . levothyroxine  25 mcg Oral QAC breakfast  . lipase/protease/amylase  36,000 Units Oral TID WC  . loratadine  10 mg Oral Daily  . pantoprazole  40 mg Oral Daily  . potassium chloride  40 mEq Oral Once  . potassium chloride  40 mEq Oral Once  . rosuvastatin  20 mg Oral q1800  . sodium chloride  3 mL Intravenous Q12H  . sucralfate  1 g Oral BID WC  . sulfaSALAzine  1,000 mg Oral QPC breakfast  . thiamine  100 mg Oral Daily  . venlafaxine  25 mg Oral BID    Continuous Infusions:    Past Medical History  Diagnosis Date  . Glaucoma   . Diabetes mellitus without complication   . Hypertension   . Neuropathy   . Crohn disease   . Asthma   . Pulmonary embolus 05/24/2014    Past Surgical History  Procedure Laterality Date  . Cesarean section    . Esophagogastroduodenoscopy N/A 05/21/2014    Procedure: ESOPHAGOGASTRODUODENOSCOPY (EGD);  Surgeon: Jeryl Columbia, MD;  Location: [facility] ENDOSCOPY;  Service: Endoscopy;  Laterality: N/A;    Arthur Holms, RD, LDN Pager #: [phone] After-Hours Pager #: [phone]

## 2014-08-05 DIAGNOSIS — I10 Essential (primary) hypertension: Secondary | ICD-10-CM | POA: Diagnosis not present

## 2014-08-05 DIAGNOSIS — Z7901 Long term (current) use of anticoagulants: Secondary | ICD-10-CM | POA: Diagnosis not present

## 2014-08-05 DIAGNOSIS — I2782 Chronic pulmonary embolism: Secondary | ICD-10-CM | POA: Diagnosis not present

## 2014-08-05 DIAGNOSIS — G8929 Other chronic pain: Secondary | ICD-10-CM | POA: Diagnosis not present

## 2014-08-05 DIAGNOSIS — R6510 Systemic inflammatory response syndrome (SIRS) of non-infectious origin without acute organ dysfunction: Secondary | ICD-10-CM | POA: Diagnosis not present

## 2014-08-05 DIAGNOSIS — Z720 Tobacco use: Secondary | ICD-10-CM | POA: Diagnosis not present

## 2014-08-05 DIAGNOSIS — K5010 Crohn's disease of large intestine without complications: Secondary | ICD-10-CM | POA: Diagnosis not present

## 2014-08-05 DIAGNOSIS — E119 Type 2 diabetes mellitus without complications: Secondary | ICD-10-CM | POA: Diagnosis not present

## 2014-08-05 DIAGNOSIS — K7010 Alcoholic hepatitis without ascites: Secondary | ICD-10-CM | POA: Diagnosis not present

## 2014-08-06 LAB — PANCREATIC ELASTASE, FECAL: PANCREATIC ELASTASE-1, STL: 174 ug/g — AB

## 2014-08-09 DIAGNOSIS — I2782 Chronic pulmonary embolism: Secondary | ICD-10-CM | POA: Diagnosis not present

## 2014-08-09 DIAGNOSIS — I10 Essential (primary) hypertension: Secondary | ICD-10-CM | POA: Diagnosis not present

## 2014-08-09 DIAGNOSIS — E119 Type 2 diabetes mellitus without complications: Secondary | ICD-10-CM | POA: Diagnosis not present

## 2014-08-09 DIAGNOSIS — K5010 Crohn's disease of large intestine without complications: Secondary | ICD-10-CM | POA: Diagnosis not present

## 2014-08-09 DIAGNOSIS — R6510 Systemic inflammatory response syndrome (SIRS) of non-infectious origin without acute organ dysfunction: Secondary | ICD-10-CM | POA: Diagnosis not present

## 2014-08-09 DIAGNOSIS — K7010 Alcoholic hepatitis without ascites: Secondary | ICD-10-CM | POA: Diagnosis not present
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
## 2014-08-10 DIAGNOSIS — K5000 Crohn's disease of small intestine without complications: Secondary | ICD-10-CM | POA: Diagnosis not present

## 2014-08-10 DIAGNOSIS — D509 Iron deficiency anemia, unspecified: Secondary | ICD-10-CM | POA: Diagnosis not present

## 2014-08-10 DIAGNOSIS — K219 Gastro-esophageal reflux disease without esophagitis: Secondary | ICD-10-CM | POA: Diagnosis not present

## 2014-08-10 DIAGNOSIS — R932 Abnormal findings on diagnostic imaging of liver and biliary tract: Secondary | ICD-10-CM | POA: Diagnosis not present

## 2014-08-14 DIAGNOSIS — R6510 Systemic inflammatory response syndrome (SIRS) of non-infectious origin without acute organ dysfunction: Secondary | ICD-10-CM | POA: Diagnosis not present

## 2014-08-14 DIAGNOSIS — E119 Type 2 diabetes mellitus without complications: Secondary | ICD-10-CM | POA: Diagnosis not present

## 2014-08-14 DIAGNOSIS — I10 Essential (primary) hypertension: Secondary | ICD-10-CM | POA: Diagnosis not present

## 2014-08-14 DIAGNOSIS — K7010 Alcoholic hepatitis without ascites: Secondary | ICD-10-CM | POA: Diagnosis not present

## 2014-08-14 DIAGNOSIS — I2782 Chronic pulmonary embolism: Secondary | ICD-10-CM | POA: Diagnosis not present

## 2014-08-14 DIAGNOSIS — K5010 Crohn's disease of large intestine without complications: Secondary | ICD-10-CM | POA: Diagnosis not present

## 2014-08-17 DIAGNOSIS — R6510 Systemic inflammatory response syndrome (SIRS) of non-infectious origin without acute organ dysfunction: Secondary | ICD-10-CM | POA: Diagnosis not present

## 2014-08-17 DIAGNOSIS — E119 Type 2 diabetes mellitus without complications: Secondary | ICD-10-CM | POA: Diagnosis not present

## 2014-08-17 DIAGNOSIS — I2782 Chronic pulmonary embolism: Secondary | ICD-10-CM | POA: Diagnosis not present

## 2014-08-17 DIAGNOSIS — K5010 Crohn's disease of large intestine without complications: Secondary | ICD-10-CM | POA: Diagnosis not present

## 2014-08-17 DIAGNOSIS — I10 Essential (primary) hypertension: Secondary | ICD-10-CM | POA: Diagnosis not present

## 2014-08-17 DIAGNOSIS — K7010 Alcoholic hepatitis without ascites: Secondary | ICD-10-CM | POA: Diagnosis not present

## 2014-08-19 DIAGNOSIS — R6510 Systemic inflammatory response syndrome (SIRS) of non-infectious origin without acute organ dysfunction: Secondary | ICD-10-CM | POA: Diagnosis not present

## 2014-08-19 DIAGNOSIS — K7010 Alcoholic hepatitis without ascites: Secondary | ICD-10-CM | POA: Diagnosis not present

## 2014-08-19 DIAGNOSIS — K5010 Crohn's disease of large intestine without complications: Secondary | ICD-10-CM | POA: Diagnosis not present

## 2014-08-19 DIAGNOSIS — I2782 Chronic pulmonary embolism: Secondary | ICD-10-CM | POA: Diagnosis not present

## 2014-08-19 DIAGNOSIS — I10 Essential (primary) hypertension: Secondary | ICD-10-CM | POA: Diagnosis not present

## 2014-08-19 DIAGNOSIS — E119 Type 2 diabetes mellitus without complications: Secondary | ICD-10-CM | POA: Diagnosis not present

## 2014-08-23 DIAGNOSIS — M545 Low back pain: Secondary | ICD-10-CM | POA: Diagnosis not present

## 2014-08-24 DIAGNOSIS — I10 Essential (primary) hypertension: Secondary | ICD-10-CM | POA: Diagnosis not present

## 2014-08-24 DIAGNOSIS — R6510 Systemic inflammatory response syndrome (SIRS) of non-infectious origin without acute organ dysfunction: Secondary | ICD-10-CM | POA: Diagnosis not present

## 2014-08-24 DIAGNOSIS — K5010 Crohn's disease of large intestine without complications: Secondary | ICD-10-CM | POA: Diagnosis not present

## 2014-08-24 DIAGNOSIS — E119 Type 2 diabetes mellitus without complications: Secondary | ICD-10-CM | POA: Diagnosis not present

## 2014-08-24 DIAGNOSIS — I2782 Chronic pulmonary embolism: Secondary | ICD-10-CM | POA: Diagnosis not present

## 2014-08-24 DIAGNOSIS — K7010 Alcoholic hepatitis without ascites: Secondary | ICD-10-CM | POA: Diagnosis not present

## 2014-08-31 DIAGNOSIS — K7010 Alcoholic hepatitis without ascites: Secondary | ICD-10-CM | POA: Diagnosis not present

## 2014-08-31 DIAGNOSIS — I10 Essential (primary) hypertension: Secondary | ICD-10-CM | POA: Diagnosis not present

## 2014-08-31 DIAGNOSIS — K5010 Crohn's disease of large intestine without complications: Secondary | ICD-10-CM | POA: Diagnosis not present

## 2014-08-31 DIAGNOSIS — I2782 Chronic pulmonary embolism: Secondary | ICD-10-CM | POA: Diagnosis not present

## 2014-08-31 DIAGNOSIS — E119 Type 2 diabetes mellitus without complications: Secondary | ICD-10-CM | POA: Diagnosis not present

## 2014-08-31 DIAGNOSIS — R6510 Systemic inflammatory response syndrome (SIRS) of non-infectious origin without acute organ dysfunction: Secondary | ICD-10-CM | POA: Diagnosis not present

## 2014-09-28 DIAGNOSIS — F411 Generalized anxiety disorder: Secondary | ICD-10-CM | POA: Diagnosis not present

## 2014-09-28 DIAGNOSIS — R7989 Other specified abnormal findings of blood chemistry: Secondary | ICD-10-CM | POA: Diagnosis not present

## 2014-09-28 DIAGNOSIS — E1140 Type 2 diabetes mellitus with diabetic neuropathy, unspecified: Secondary | ICD-10-CM | POA: Diagnosis not present

## 2014-09-28 DIAGNOSIS — R05 Cough: Secondary | ICD-10-CM | POA: Diagnosis not present

## 2014-09-28 DIAGNOSIS — G4700 Insomnia, unspecified: Secondary | ICD-10-CM | POA: Diagnosis not present

## 2014-09-28 DIAGNOSIS — G2581 Restless legs syndrome: Secondary | ICD-10-CM | POA: Diagnosis not present

## 2014-09-28 DIAGNOSIS — D509 Iron deficiency anemia, unspecified: Secondary | ICD-10-CM | POA: Diagnosis not present

## 2014-09-28 DIAGNOSIS — Z09 Encounter for follow-up examination after completed treatment for conditions other than malignant neoplasm: Secondary | ICD-10-CM | POA: Diagnosis not present

## 2014-09-28 DIAGNOSIS — E785 Hyperlipidemia, unspecified: Secondary | ICD-10-CM | POA: Diagnosis not present

## 2014-09-28 DIAGNOSIS — I10 Essential (primary) hypertension: Secondary | ICD-10-CM | POA: Diagnosis not present

## 2014-09-28 DIAGNOSIS — R42 Dizziness and giddiness: Secondary | ICD-10-CM | POA: Diagnosis not present

## 2014-09-29 ENCOUNTER — Other Ambulatory Visit: Payer: Self-pay | Admitting: Physical Medicine and Rehabilitation

## 2014-10-02 DIAGNOSIS — I10 Essential (primary) hypertension: Secondary | ICD-10-CM | POA: Diagnosis not present

## 2014-10-02 DIAGNOSIS — I2782 Chronic pulmonary embolism: Secondary | ICD-10-CM | POA: Diagnosis not present

## 2014-10-02 DIAGNOSIS — E119 Type 2 diabetes mellitus without complications: Secondary | ICD-10-CM | POA: Diagnosis not present

## 2014-10-02 DIAGNOSIS — K5010 Crohn's disease of large intestine without complications: Secondary | ICD-10-CM | POA: Diagnosis not present

## 2014-10-02 DIAGNOSIS — K7010 Alcoholic hepatitis without ascites: Secondary | ICD-10-CM | POA: Diagnosis not present

## 2014-10-02 DIAGNOSIS — R6510 Systemic inflammatory response syndrome (SIRS) of non-infectious origin without acute organ dysfunction: Secondary | ICD-10-CM | POA: Diagnosis not present

## 2014-10-04 DIAGNOSIS — K7010 Alcoholic hepatitis without ascites: Secondary | ICD-10-CM | POA: Diagnosis not present

## 2014-10-04 DIAGNOSIS — Z720 Tobacco use: Secondary | ICD-10-CM | POA: Diagnosis not present

## 2014-10-04 DIAGNOSIS — K5010 Crohn's disease of large intestine without complications: Secondary | ICD-10-CM | POA: Diagnosis not present

## 2014-10-04 DIAGNOSIS — I10 Essential (primary) hypertension: Secondary | ICD-10-CM | POA: Diagnosis not present

## 2014-10-04 DIAGNOSIS — E119 Type 2 diabetes mellitus without complications: Secondary | ICD-10-CM | POA: Diagnosis not present

## 2014-10-04 DIAGNOSIS — R6510 Systemic inflammatory response syndrome (SIRS) of non-infectious origin without acute organ dysfunction: Secondary | ICD-10-CM | POA: Diagnosis not present

## 2014-10-04 DIAGNOSIS — Z7901 Long term (current) use of anticoagulants: Secondary | ICD-10-CM | POA: Diagnosis not present

## 2014-10-04 DIAGNOSIS — I2782 Chronic pulmonary embolism: Secondary | ICD-10-CM | POA: Diagnosis not present

## 2014-10-04 DIAGNOSIS — G8929 Other chronic pain: Secondary | ICD-10-CM | POA: Diagnosis not present

## 2014-10-07 DIAGNOSIS — I2782 Chronic pulmonary embolism: Secondary | ICD-10-CM | POA: Diagnosis not present

## 2014-10-07 DIAGNOSIS — I10 Essential (primary) hypertension: Secondary | ICD-10-CM | POA: Diagnosis not present

## 2014-10-07 DIAGNOSIS — K5010 Crohn's disease of large intestine without complications: Secondary | ICD-10-CM | POA: Diagnosis not present

## 2014-10-07 DIAGNOSIS — R6510 Systemic inflammatory response syndrome (SIRS) of non-infectious origin without acute organ dysfunction: Secondary | ICD-10-CM | POA: Diagnosis not present

## 2014-10-07 DIAGNOSIS — E119 Type 2 diabetes mellitus without complications: Secondary | ICD-10-CM | POA: Diagnosis not present

## 2014-10-07 DIAGNOSIS — K7010 Alcoholic hepatitis without ascites: Secondary | ICD-10-CM | POA: Diagnosis not present

## 2014-11-09 ENCOUNTER — Ambulatory Visit: Payer: Medicare Other | Admitting: Neurology

## 2014-11-10 ENCOUNTER — Telehealth: Payer: Self-pay | Admitting: Neurology

## 2014-11-10 NOTE — Telephone Encounter (Signed)
Pt no showed 11/09/14 appt w/ Dr. Everlena Cooper. Reminder call report showed that the automated reminder call was picked up by a person. No show letter + policy mailed to patient. Dr. Lonzo Cloud notified by fax / Oneita Kras,

## 2014-11-18 DIAGNOSIS — M25571 Pain in right ankle and joints of right foot: Secondary | ICD-10-CM | POA: Diagnosis not present

## 2014-11-18 DIAGNOSIS — M25551 Pain in right hip: Secondary | ICD-10-CM | POA: Diagnosis not present

## 2014-11-18 DIAGNOSIS — M25511 Pain in right shoulder: Secondary | ICD-10-CM | POA: Diagnosis not present

## 2014-11-18 DIAGNOSIS — M25561 Pain in right knee: Secondary | ICD-10-CM | POA: Diagnosis not present

## 2014-11-19 DIAGNOSIS — I2782 Chronic pulmonary embolism: Secondary | ICD-10-CM | POA: Diagnosis not present

## 2014-11-19 DIAGNOSIS — K5010 Crohn's disease of large intestine without complications: Secondary | ICD-10-CM | POA: Diagnosis not present

## 2014-11-19 DIAGNOSIS — E119 Type 2 diabetes mellitus without complications: Secondary | ICD-10-CM | POA: Diagnosis not present

## 2014-11-19 DIAGNOSIS — K7010 Alcoholic hepatitis without ascites: Secondary | ICD-10-CM | POA: Diagnosis not present

## 2014-11-19 DIAGNOSIS — I10 Essential (primary) hypertension: Secondary | ICD-10-CM | POA: Diagnosis not present

## 2014-11-19 DIAGNOSIS — R6510 Systemic inflammatory response syndrome (SIRS) of non-infectious origin without acute organ dysfunction: Secondary | ICD-10-CM | POA: Diagnosis not present

## 2014-12-02 DIAGNOSIS — M47816 Spondylosis without myelopathy or radiculopathy, lumbar region: Secondary | ICD-10-CM | POA: Diagnosis not present

## 2014-12-02 DIAGNOSIS — S93491A Sprain of other ligament of right ankle, initial encounter: Secondary | ICD-10-CM | POA: Diagnosis not present

## 2014-12-23 ENCOUNTER — Ambulatory Visit: Payer: Self-pay

## 2014-12-23 ENCOUNTER — Telehealth: Payer: Self-pay

## 2014-12-23 NOTE — Patient Outreach (Signed)
CSW called patient for third attempt to reach via Phone. In accordance with [facility] policy CSW will send a dischage letter to patient and update doctor.

## 2014-12-27 DIAGNOSIS — S92324A Nondisplaced fracture of second metatarsal bone, right foot, initial encounter for closed fracture: Secondary | ICD-10-CM | POA: Diagnosis not present

## 2014-12-27 DIAGNOSIS — S92334A Nondisplaced fracture of third metatarsal bone, right foot, initial encounter for closed fracture: Secondary | ICD-10-CM | POA: Diagnosis not present

## 2014-12-27 DIAGNOSIS — S43401A Unspecified sprain of right shoulder joint, initial encounter: Secondary | ICD-10-CM | POA: Diagnosis not present

## 2014-12-27 NOTE — Patient Outreach (Signed)
[facility]) Care Management  12/27/2014  [name] 27-Feb-1961 [id number]   Received notification from Kenney Houseman, CSW to close case due to patient failed to respond to outreach attempts from [facility].  Case closed at this point.  [name]. [facility] Care Management [facility] CM Assistant Phone: [phone] Fax: [phone]

## 2014-12-28 ENCOUNTER — Emergency Department (HOSPITAL_COMMUNITY): Payer: Medicare Other

## 2014-12-28 ENCOUNTER — Encounter (HOSPITAL_COMMUNITY): Payer: Self-pay | Admitting: Emergency Medicine

## 2014-12-28 ENCOUNTER — Inpatient Hospital Stay (HOSPITAL_COMMUNITY)
Admission: EM | Admit: 2014-12-28 | Discharge: 2015-01-03 | DRG: 385 | Disposition: A | Discharge status: Home Health | Payer: Medicare Other | Attending: Internal Medicine | Admitting: Internal Medicine

## 2014-12-28 DIAGNOSIS — E860 Dehydration: Secondary | ICD-10-CM | POA: Diagnosis present

## 2014-12-28 DIAGNOSIS — I10 Essential (primary) hypertension: Secondary | ICD-10-CM | POA: Diagnosis present

## 2014-12-28 DIAGNOSIS — R55 Syncope and collapse: Secondary | ICD-10-CM | POA: Diagnosis not present

## 2014-12-28 DIAGNOSIS — Z880 Allergy status to penicillin: Secondary | ICD-10-CM

## 2014-12-28 DIAGNOSIS — D72829 Elevated white blood cell count, unspecified: Secondary | ICD-10-CM | POA: Diagnosis present

## 2014-12-28 DIAGNOSIS — B9689 Other specified bacterial agents as the cause of diseases classified elsewhere: Secondary | ICD-10-CM | POA: Diagnosis present

## 2014-12-28 DIAGNOSIS — R195 Other fecal abnormalities: Secondary | ICD-10-CM | POA: Diagnosis present

## 2014-12-28 DIAGNOSIS — E039 Hypothyroidism, unspecified: Secondary | ICD-10-CM | POA: Diagnosis present

## 2014-12-28 DIAGNOSIS — M549 Dorsalgia, unspecified: Secondary | ICD-10-CM | POA: Diagnosis present

## 2014-12-28 DIAGNOSIS — M542 Cervicalgia: Secondary | ICD-10-CM | POA: Diagnosis present

## 2014-12-28 DIAGNOSIS — A419 Sepsis, unspecified organism: Secondary | ICD-10-CM | POA: Diagnosis not present

## 2014-12-28 DIAGNOSIS — H409 Unspecified glaucoma: Secondary | ICD-10-CM | POA: Diagnosis present

## 2014-12-28 DIAGNOSIS — Z681 Body mass index (BMI) 19 or less, adult: Secondary | ICD-10-CM | POA: Diagnosis not present

## 2014-12-28 DIAGNOSIS — Z91041 Radiographic dye allergy status: Secondary | ICD-10-CM

## 2014-12-28 DIAGNOSIS — E785 Hyperlipidemia, unspecified: Secondary | ICD-10-CM | POA: Diagnosis present

## 2014-12-28 DIAGNOSIS — Z86711 Personal history of pulmonary embolism: Secondary | ICD-10-CM | POA: Diagnosis present

## 2014-12-28 DIAGNOSIS — E118 Type 2 diabetes mellitus with unspecified complications: Secondary | ICD-10-CM

## 2014-12-28 DIAGNOSIS — F419 Anxiety disorder, unspecified: Secondary | ICD-10-CM | POA: Diagnosis not present

## 2014-12-28 DIAGNOSIS — R6510 Systemic inflammatory response syndrome (SIRS) of non-infectious origin without acute organ dysfunction: Secondary | ICD-10-CM | POA: Diagnosis not present

## 2014-12-28 DIAGNOSIS — Z7901 Long term (current) use of anticoagulants: Secondary | ICD-10-CM

## 2014-12-28 DIAGNOSIS — I509 Heart failure, unspecified: Secondary | ICD-10-CM | POA: Diagnosis not present

## 2014-12-28 DIAGNOSIS — E440 Moderate protein-calorie malnutrition: Secondary | ICD-10-CM | POA: Diagnosis present

## 2014-12-28 DIAGNOSIS — Z79899 Other long term (current) drug therapy: Secondary | ICD-10-CM | POA: Diagnosis not present

## 2014-12-28 DIAGNOSIS — F17210 Nicotine dependence, cigarettes, uncomplicated: Secondary | ICD-10-CM | POA: Diagnosis present

## 2014-12-28 DIAGNOSIS — N390 Urinary tract infection, site not specified: Secondary | ICD-10-CM | POA: Diagnosis present

## 2014-12-28 DIAGNOSIS — R531 Weakness: Secondary | ICD-10-CM | POA: Diagnosis not present

## 2014-12-28 DIAGNOSIS — K50919 Crohn's disease, unspecified, with unspecified complications: Secondary | ICD-10-CM

## 2014-12-28 DIAGNOSIS — K5090 Crohn's disease, unspecified, without complications: Principal | ICD-10-CM | POA: Diagnosis present

## 2014-12-28 DIAGNOSIS — N200 Calculus of kidney: Secondary | ICD-10-CM | POA: Diagnosis not present

## 2014-12-28 DIAGNOSIS — N179 Acute kidney failure, unspecified: Secondary | ICD-10-CM | POA: Diagnosis not present

## 2014-12-28 DIAGNOSIS — K760 Fatty (change of) liver, not elsewhere classified: Secondary | ICD-10-CM | POA: Diagnosis present

## 2014-12-28 DIAGNOSIS — K529 Noninfective gastroenteritis and colitis, unspecified: Secondary | ICD-10-CM | POA: Diagnosis not present

## 2014-12-28 DIAGNOSIS — R197 Diarrhea, unspecified: Secondary | ICD-10-CM | POA: Diagnosis not present

## 2014-12-28 DIAGNOSIS — Z720 Tobacco use: Secondary | ICD-10-CM | POA: Diagnosis present

## 2014-12-28 DIAGNOSIS — I951 Orthostatic hypotension: Secondary | ICD-10-CM | POA: Diagnosis present

## 2014-12-28 DIAGNOSIS — E1140 Type 2 diabetes mellitus with diabetic neuropathy, unspecified: Secondary | ICD-10-CM | POA: Diagnosis present

## 2014-12-28 DIAGNOSIS — I2699 Other pulmonary embolism without acute cor pulmonale: Secondary | ICD-10-CM | POA: Diagnosis present

## 2014-12-28 DIAGNOSIS — R740 Nonspecific elevation of levels of transaminase and lactic acid dehydrogenase [LDH]: Secondary | ICD-10-CM | POA: Diagnosis not present

## 2014-12-28 DIAGNOSIS — J45909 Unspecified asthma, uncomplicated: Secondary | ICD-10-CM | POA: Diagnosis present

## 2014-12-28 DIAGNOSIS — E872 Acidosis, unspecified: Secondary | ICD-10-CM | POA: Diagnosis present

## 2014-12-28 DIAGNOSIS — R7989 Other specified abnormal findings of blood chemistry: Secondary | ICD-10-CM | POA: Diagnosis present

## 2014-12-28 DIAGNOSIS — R748 Abnormal levels of other serum enzymes: Secondary | ICD-10-CM | POA: Diagnosis not present

## 2014-12-28 DIAGNOSIS — Z888 Allergy status to other drugs, medicaments and biological substances status: Secondary | ICD-10-CM | POA: Diagnosis not present

## 2014-12-28 DIAGNOSIS — I959 Hypotension, unspecified: Secondary | ICD-10-CM | POA: Diagnosis present

## 2014-12-28 DIAGNOSIS — F411 Generalized anxiety disorder: Secondary | ICD-10-CM | POA: Diagnosis present

## 2014-12-28 DIAGNOSIS — J9600 Acute respiratory failure, unspecified whether with hypoxia or hypercapnia: Secondary | ICD-10-CM | POA: Diagnosis present

## 2014-12-28 DIAGNOSIS — R778 Other specified abnormalities of plasma proteins: Secondary | ICD-10-CM

## 2014-12-28 DIAGNOSIS — E876 Hypokalemia: Secondary | ICD-10-CM | POA: Diagnosis present

## 2014-12-28 DIAGNOSIS — G8929 Other chronic pain: Secondary | ICD-10-CM | POA: Diagnosis present

## 2014-12-28 DIAGNOSIS — I248 Other forms of acute ischemic heart disease: Secondary | ICD-10-CM | POA: Diagnosis not present

## 2014-12-28 DIAGNOSIS — E43 Unspecified severe protein-calorie malnutrition: Secondary | ICD-10-CM | POA: Insufficient documentation

## 2014-12-28 DIAGNOSIS — D638 Anemia in other chronic diseases classified elsewhere: Secondary | ICD-10-CM | POA: Diagnosis present

## 2014-12-28 LAB — CBC WITH DIFFERENTIAL/PLATELET
BASOS ABS: 0 10*3/uL (ref 0.0–0.1)
Basophils Relative: 0 % (ref 0–1)
EOS PCT: 0 % (ref 0–5)
Eosinophils Absolute: 0 10*3/uL (ref 0.0–0.7)
HCT: 38.5 % (ref 36.0–46.0)
HEMOGLOBIN: 11.9 g/dL — AB (ref 12.0–15.0)
Lymphocytes Relative: 21 % (ref 12–46)
Lymphs Abs: 2.6 10*3/uL (ref 0.7–4.0)
MCH: 28.5 pg (ref 26.0–34.0)
MCHC: 30.9 g/dL (ref 30.0–36.0)
MCV: 92.1 fL (ref 78.0–100.0)
MONOS PCT: 4 % (ref 3–12)
Monocytes Absolute: 0.6 10*3/uL (ref 0.1–1.0)
Neutro Abs: 9.5 10*3/uL — ABNORMAL HIGH (ref 1.7–7.7)
Neutrophils Relative %: 75 % (ref 43–77)
Platelets: 307 10*3/uL (ref 150–400)
RBC: 4.18 MIL/uL (ref 3.87–5.11)
RDW: 16.5 % — ABNORMAL HIGH (ref 11.5–15.5)
WBC: 12.7 10*3/uL — AB (ref 4.0–10.5)

## 2014-12-28 LAB — PROTIME-INR
INR: 1.03 (ref 0.00–1.49)
PROTHROMBIN TIME: 13.6 s (ref 11.6–15.2)

## 2014-12-28 LAB — COMPREHENSIVE METABOLIC PANEL
ALBUMIN: 2.3 g/dL — AB (ref 3.5–5.2)
ALK PHOS: 159 U/L — AB (ref 39–117)
ALT: 23 U/L (ref 0–35)
ANION GAP: 22 — AB (ref 5–15)
AST: 44 U/L — ABNORMAL HIGH (ref 0–37)
BUN: 9 mg/dL (ref 6–23)
CO2: 15 mmol/L — AB (ref 19–32)
CREATININE: 1.25 mg/dL — AB (ref 0.50–1.10)
Calcium: 7.4 mg/dL — ABNORMAL LOW (ref 8.4–10.5)
Chloride: 102 mmol/L (ref 96–112)
GFR, EST AFRICAN AMERICAN: 55 mL/min — AB (ref >90)
GFR, EST NON AFRICAN AMERICAN: 47 mL/min — AB (ref >90)
Glucose, Bld: 97 mg/dL (ref 70–99)
Potassium: 3.1 mmol/L — ABNORMAL LOW (ref 3.5–5.1)
SODIUM: 139 mmol/L (ref 135–145)
TOTAL PROTEIN: 5.7 g/dL — AB (ref 6.0–8.3)
Total Bilirubin: 0.9 mg/dL (ref 0.3–1.2)

## 2014-12-28 LAB — LIPASE, BLOOD: Lipase: 18 U/L (ref 11–59)

## 2014-12-28 LAB — CBG MONITORING, ED: Glucose-Capillary: 119 mg/dL — ABNORMAL HIGH (ref 70–99)

## 2014-12-28 LAB — TROPONIN I: Troponin I: 0.13 ng/mL — ABNORMAL HIGH (ref <0.031)

## 2014-12-28 LAB — POC OCCULT BLOOD, ED: Fecal Occult Bld: POSITIVE — AB

## 2014-12-28 LAB — BRAIN NATRIURETIC PEPTIDE: B Natriuretic Peptide: 77.5 pg/mL (ref 0.0–100.0)

## 2014-12-28 MED ORDER — SODIUM CHLORIDE 0.9 % IV BOLUS (SEPSIS)
1000.0000 mL | Freq: Once | INTRAVENOUS | Status: AC
Start: 2014-12-28 — End: 2014-12-29
  Administered 2014-12-28: 1000 mL via INTRAVENOUS

## 2014-12-28 MED ORDER — SODIUM CHLORIDE 0.9 % IV BOLUS (SEPSIS)
1000.0000 mL | Freq: Once | INTRAVENOUS | Status: AC
  Administered 2014-12-28: 1000 mL via INTRAVENOUS

## 2014-12-28 NOTE — Consult Note (Addendum)
Cardiology Consultation Note  Patient ID: [name], MRN: [id number], DOB/AGE: [birth year] 54 y.o. Admit date: 12/28/2014   Date of Consult: 12/29/2014 Primary Physician: Clelia Schaumann, MD Primary Cardiologist: None  Chief Complaint: Diarrhea Reason for Consultation: Troponin elevation   HPI: Ms. [name] is a 54 year old woman with history of DM2, HTN, HLD, PE on anticoagulation (questionable medication compliance) and recurrent diarrhea thought to be due to Crohn's disease versus pancreatic insufficiency, whom we have been asked to see by Dr. Cyndie Chime and the Medicine Teaching Service due to a mild troponin elevation.  The patient states that she has felt unwell for approximately 1 week.she initially had intermittent chills and hot flashes and then subsequently developed diarrhea 5 days ago.  The following day, she noted orthostatic lightheadedness to the point of actually falling and briefly blacking out.  She has felt very faint since then, particularly in light of worsening diarrhea over the last 2 days.  Yesterday, she states that she was having watery bowel movements almost every 30 minutes.  She denies nausea and emesis.  She has had some epigastric pain, however.  She denies chest pain and shortness of breath.  She has chronic palpitations that she describes as a rapid heartbeat lasting a few seconds, as well as intermittent skipped beats.  She has not had any edema recently.  She also denies orthopnea and PND.  Her knowledge, she has never had any cardiac disease or prior workup.  She was diagnosed with pulmonary emboli in 05/2014.  She was prescribed eloquence, which she takes most days of the week, missing about 3 doses per week.  She has not had any recent bleeding.  She notes that she has experienced similar Crohn's flares in the past as well.  Past Medical History  Diagnosis Date  . Glaucoma   . Diabetes mellitus without complication   . Hypertension   . Neuropathy   .  Crohn disease   . Asthma   . Pulmonary embolus 05/24/2014      Most Recent Cardiac Studies: None.   Surgical History:  Past Surgical History  Procedure Laterality Date  . Cesarean section    . Esophagogastroduodenoscopy N/A 05/21/2014    Procedure: ESOPHAGOGASTRODUODENOSCOPY (EGD);  Surgeon: Petra Kuba, MD;  Location: [facility] ENDOSCOPY;  Service: Endoscopy;  Laterality: N/A;     Home Meds: Prior to Admission medications   Medication Sig Start Date Hend Mccarrell Date Taking? Authorizing Provider  alprazolam Prudy Feeler) 2 MG tablet Take 0.5 tablets (1 mg total) by mouth 3 (three) times daily as needed for sleep or anxiety. 08/02/14  Yes Kathlen Mody, MD  apixaban (ELIQUIS) 5 MG TABS tablet Take 1 tablet (5 mg total) by mouth 2 (two) times daily. 08/02/14  Yes Kathlen Mody, MD  feeding supplement, RESOURCE BREEZE, (RESOURCE BREEZE) LIQD Take 1 Container by mouth 3 (three) times daily between meals. 08/02/14  Yes Kathlen Mody, MD  folic acid (FOLVITE) 1 MG tablet Take 1 tablet (1 mg total) by mouth daily. 08/02/14  Yes Kathlen Mody, MD  gabapentin (NEURONTIN) 800 MG tablet Take 1 tablet (800 mg total) by mouth 3 (three) times daily. 08/02/14  Yes Kathlen Mody, MD  HYDROcodone-acetaminophen (NORCO) 10-325 MG per tablet Take 1 tablet by mouth every 6 (six) hours as needed. for pain 11/18/14  Yes Historical Provider, MD  HYDROmorphone (DILAUDID) 4 MG tablet Take 4 mg by mouth as needed for severe pain.  12/02/14  Yes Historical Provider, MD  levothyroxine (SYNTHROID, LEVOTHROID)  25 MCG tablet Take 1 tablet (25 mcg total) by mouth daily before breakfast. 08/02/14  Yes Kathlen Mody, MD  lipase/protease/amylase (CREON) 36000 UNITS CPEP capsule Take 1 capsule (36,000 Units total) by mouth 3 (three) times daily with meals. 08/02/14  Yes Kathlen Mody, MD  meclizine (ANTIVERT) 25 MG tablet Take 25 mg by mouth 3 (three) times daily as needed for dizziness.   Yes Historical Provider, MD  naproxen sodium (ANAPROX) 220 MG tablet Take  220 mg by mouth as needed (for pain).   Yes Historical Provider, MD  ondansetron (ZOFRAN) 4 MG tablet Take 4 mg by mouth every 8 (eight) hours as needed for nausea or vomiting.   Yes Historical Provider, MD  rosuvastatin (CRESTOR) 20 MG tablet Take 1 tablet (20 mg total) by mouth daily. 08/02/14  Yes Kathlen Mody, MD  sucralfate (CARAFATE) 1 G tablet Take 1 tablet (1 g total) by mouth 2 (two) times daily. 08/02/14  Yes Kathlen Mody, MD  sulfaSALAzine (AZULFIDINE) 500 MG tablet Take 2 tablets (1,000 mg total) by mouth daily. 08/02/14  Yes Kathlen Mody, MD  thiamine (VITAMIN B-1) 100 MG tablet Take 1 tablet (100 mg total) by mouth daily. 08/02/14  Yes Kathlen Mody, MD  traZODone (DESYREL) 25 mg TABS tablet Take 0.5 tablets (25 mg total) by mouth at bedtime as needed for sleep. 08/02/14  Yes Kathlen Mody, MD  venlafaxine (EFFEXOR) 25 MG tablet Take 1 tablet (25 mg total) by mouth 2 (two) times daily. 08/02/14  Yes Kathlen Mody, MD  Water For Irrigation, Sterile (FREE WATER) SOLN Place 200 mLs into feeding tube every 8 (eight) hours. 08/02/14  Yes Kathlen Mody, MD  HYDROmorphone (DILAUDID) 2 MG tablet Take 0.5 tablets (1 mg total) by mouth every 6 (six) hours as needed for moderate pain or severe pain. 08/02/14   Kathlen Mody, MD  pantoprazole (PROTONIX) 20 MG tablet Take 2 tablets (40 mg total) by mouth daily. 08/02/14   Kathlen Mody, MD    Inpatient Medications:  None currently prescribed.   Allergies:  Allergies  Allergen Reactions  . Iohexol Other (See Comments)    "severe burning" Patient has received Contrast in 2005 with 13 hour pre-medication, and had no reaction at that time  . Spiriva Handihaler [Tiotropium Bromide Monohydrate] Nausea And Vomiting  . Ativan [Lorazepam] Other (See Comments)    hallucinations  . Penicillins Hives    History   Social History  . Marital Status: Divorced    Spouse Name: N/A  . Number of Children: N/A  . Years of Education: N/A   Occupational History  .  Not on file.   Social History Main Topics  . Smoking status: Current Every Day Smoker -- 0.25 packs/day for 26 years    Types: Cigarettes  . Smokeless tobacco: Never Used  . Alcohol Use: No  . Drug Use: No  . Sexual Activity: Not on file   Other Topics Concern  . Not on file   Social History Narrative     Family History  Problem Relation Age of Onset  . Breast cancer Mother   . Heart disease Mother 52    CABG  . Diabetes Mother   . Heart disease Father 63    CABG  . Diabetes Father      Review of Systems: A 12 system review of systems was performed and was negative, except as noted in the history of present illness.  Labs:  Recent Labs  12/28/14 1905  TROPONINI 0.13*  Lab Results  Component Value Date   WBC 12.7* 12/28/2014   HGB 11.9* 12/28/2014   HCT 38.5 12/28/2014   MCV 92.1 12/28/2014   PLT 307 12/28/2014    Recent Labs Lab 12/28/14 1905  NA 139  K 3.1*  CL 102  CO2 15*  BUN 9  CREATININE 1.25*  CALCIUM 7.4*  PROT 5.7*  BILITOT 0.9  ALKPHOS 159*  ALT 23  AST 44*  GLUCOSE 97   Lab Results  Component Value Date   CHOL  03/27/2009    180        ATP III CLASSIFICATION:  <200     mg/dL   Desirable  161-096  mg/dL   Borderline High  >=045    mg/dL   High          HDL 30* 03/27/2009   LDLCALC * 03/27/2009    120        Total Cholesterol/HDL:CHD Risk Coronary Heart Disease Risk Table                     Men   Women  1/2 Average Risk   3.4   3.3  Average Risk       5.0   4.4  2 X Average Risk   9.6   7.1  3 X Average Risk  23.4   11.0        Use the calculated Patient Ratio above and the CHD Risk Table to determine the patient's CHD Risk.        ATP III CLASSIFICATION (LDL):  <100     mg/dL   Optimal  409-811  mg/dL   Near or Above                    Optimal  130-159  mg/dL   Borderline  914-782  mg/dL   High  >956     mg/dL   Very High   TRIG 213* 03/27/2009   Lab Results  Component Value Date   DDIMER 1.93* 05/07/2014     Radiology/Studies:  Ct Abdomen Pelvis Wo Contrast  12/28/2014   CLINICAL DATA:  Diarrhea.  Near syncope.  Weakness.  EXAM: CT ABDOMEN AND PELVIS WITHOUT CONTRAST  TECHNIQUE: Multidetector CT imaging of the abdomen and pelvis was performed following the standard protocol without IV contrast.  COMPARISON:  CT scan dated 05/07/2014 and MRI of the abdomen dated 07/30/2014  FINDINGS: There is new diffuse hepatic steatosis. Stable 2.1 cm cyst in the anterior aspect of the dome of the right lobe of the liver. Diffuse chronic biliary ductal dilatation, unchanged. The gallbladder is not distended.  Spleen, pancreas, adrenal glands, and kidneys are normal except for tiny calcifications in the upper pole of the right kidney and in the mid left kidney. No hydronephrosis.  There is fluid throughout the large and small bowel including prominent fluid in the rectum. There are no dilated loops of bowel. The terminal ileum and appendix appear normal. Uterus is normal. Left ovary appears normal. Right ovary is obscured by the bowel.  No ascites.  No free air.  No acute osseous abnormality.  IMPRESSION: 1. Interval development of extensive hepatic steatosis. 2. Fluid throughout the large and small bowel suggesting gastroenteritis. 3. Tiny bilateral renal calculi, stable. 4. Chronic of biliary ductal dilatation unchanged.   Electronically Signed   By: Francene Boyers M.D.   On: 12/28/2014 20:50   Ct Head Wo Contrast  12/28/2014   CLINICAL DATA:  Near syncope today while using the commode. Weakness for several days.  EXAM: CT HEAD WITHOUT CONTRAST  TECHNIQUE: Contiguous axial images were obtained from the base of the skull through the vertex without intravenous contrast.  COMPARISON:  Head CT 08/01/2014  FINDINGS: No intracranial hemorrhage, mass effect, or midline shift. No hydrocephalus. The basilar cisterns are patent. No evidence of territorial infarct. No intracranial fluid collection. Mild atrophy, similar to prior exam.  Atherosclerosis noted about the intracranial vasculature at the skull base. Calvarium is intact. Included paranasal sinuses and mastoid air cells are well aerated.  IMPRESSION: No acute intracranial abnormality.   Electronically Signed   By: Rubye Oaks M.D.   On: 12/28/2014 20:43   Dg Chest Port 1 View  12/28/2014   CLINICAL DATA:  54 year old female with 1 day history of syncope. Hypertension.  EXAM: PORTABLE CHEST - 1 VIEW  COMPARISON:  Chest x-ray 07/30/2014.  FINDINGS: Lung volumes are normal. No consolidative airspace disease. No pleural effusions. No pneumothorax. No pulmonary nodule or mass noted. Pulmonary vasculature and the cardiomediastinal silhouette are within normal limits.  IMPRESSION: No radiographic evidence of acute cardiopulmonary disease.   Electronically Signed   By: Trudie Reed M.D.   On: 12/28/2014 21:35    Wt Readings from Last 3 Encounters:  12/28/14 40.824 kg (90 lb)  08/02/14 43.319 kg (95 lb 8 oz)  05/19/14 47.31 kg (104 lb 4.8 oz)    EKG: sinus tachycardia with frequent PACs and nonspecific T-wave changes.  Occasional PVCs also noted.  Physical Exam: Blood pressure 124/78, pulse 82, temperature 99.6 F (37.6 C), temperature source Rectal, resp. rate 15, height 5\' 3"  (1.6 m), weight 40.824 kg (90 lb), last menstrual period 10/01/2010, SpO2 100 %. General: very thin, pale woman lying in bed comfortably. Head: Normocephalic, atraumatic, sclera non-icteric, no xanthomas, nares are without discharge.  Neck: Negative for carotid bruits. JVD not elevated. Lungs: Clear bilaterally to auscultation without wheezes, rales, or rhonchi. Breathing is unlabored. Heart: RRR with S1 S2. No murmurs, rubs, or gallops appreciated. Abdomen: Soft, non-tender, non-distended with normoactive bowel sounds. No hepatomegaly. No rebound/guarding. No obvious abdominal masses. Msk:  Strength and tone appear normal for age. Extremities: No clubbing or cyanosis. No edema.  Distal  pedal pulses are 2+ and equal bilaterally. Neuro: Alert and oriented X 3. No facial asymmetry. No focal deficit. Moves all extremities spontaneously. Psych:  Responds to questions appropriately with a normal affect.    Assessment and Plan: 54 year old woman with multiple cardiac risk factors including type 2 diabetes mellitus, hypertension, hyperlipidemia, and family history of coronary artery disease, whom we have been asked to see due to  A minimal elevation of troponin in the setting of recent diarrhea leading to significant dehydration.  The patient has not had any symptoms to suggest coronary insufficiency.  Troponin elevation: This is nonspecific in the setting of the patient's acute illness.  It most likely retroflexed supply-demand mismatch rather than an acute plaque rupture myocardial infarction.  We agree with supportive care at this time. - Continue volume resuscitation and evaluation/treatment of the patient's diarrhea. - Trend troponins every 6 hours until they have peaked, then stop. - Administer aspirin 325 mg x 1 followed by 81 mg daily for now.  Decision to continue will depend on bleeding risk and need for continuation of apixaban in the future. - Continue rosuvastatin 20 mg daily - Obtain transthoracic echocardiogram - Patient may need non-invasive ischemia evaluation (myocardial perfusion stress test) once she has recovered from  her acute GI illness. - Check TSH given atrial arrhythmia and history of abnormal TSH in the past.  Pulmonary embolism:  Unclear if this was provoked or unprovoked. - Continue apixaban for now, but patient is currently more than 6 months from event.  Defer need for continued anticoagulation to the primary service.  Signed, Nihal Marzella A. MD 12/29/2014, 1:35 AM

## 2014-12-28 NOTE — ED Notes (Signed)
Report to kevin, rn.  Pt care transferred. 

## 2014-12-28 NOTE — ED Notes (Signed)
Pt reports 10/10 pain, requesting pain medicine. pa sciacca notified

## 2014-12-28 NOTE — ED Notes (Signed)
Per ems, pt reports diarrhea x 2 days, near syncope today while using the commode.  Pt arrives awake, alert, oriented, pale

## 2014-12-28 NOTE — ED Provider Notes (Signed)
Complains of back pain generalized weakness and diarrhea onset 4 days ago. On exam patient is chronically ill-appearing alert Glasgow Coma Score 15   Doug Sou, MD 12/28/14 2314

## 2014-12-28 NOTE — ED Notes (Signed)
Patient continues to complain of 10/10 pain, requesting pain medicine.  PA Sciacca again notified, once again RN requesting PA discuss plan of care with patient.

## 2014-12-28 NOTE — ED Provider Notes (Signed)
CSN: [id number]     Arrival date & time 12/28/14  1813 History   First MD Initiated Contact with Patient 12/28/14 1835     Chief Complaint  Patient presents with  . Near Syncope  . Diarrhea     (Consider location/radiation/quality/duration/timing/severity/associated sxs/prior Treatment) The history is provided by the patient. No language interpreter was used.  [name] is a 54 y/o F with PMHx of glaucoma, DM, HTN, neuropathy, Crohn disease, asthma, PE presenting to the ED with diarrhea that has been x 3 days. Patient reported that the diarrhea has gotten progressively worse, reported that she had at least 10-12 episodes of watery loose stools yesterday-denied blood or mucus. Reported that she has been having stomach pain localized to the upper portion of her abdomen. Patient reported that the discomfort is tender upon palpation. Reported that she's been experiencing nausea without episodes of vomiting. Stated that she's been having on and off fever for the past week, reported that her fever yesterday was 101F. Stated that today while on her way to the bathroom she got up from her bed had sudden onset of dizziness, felt cold and clammy and syncopized  - reported that she thinks she passed out for approximately 1-2 seconds, reported that she woke up laying flat on her bed. Patient reported that she has been drinking water, but has not been eating. Patient did report that she had a fall approximately 2 weeks ago resulting neck pain, back pain, foot pain, hip pain - right shoulder fracture identified at orthopedics office. Denied vomiting, melena, hematochezia, fall, head injury, travel, sick contacts or recent antibiotics, no changes to medication, blurred vision, sudden loss of vision, chest pain, difficulty breathing, leg swelling. PCP Dr. Lonzo Cloud  Past Medical History  Diagnosis Date  . Glaucoma   . Diabetes mellitus without complication   . Hypertension   . Neuropathy   . Crohn  disease   . Asthma   . Pulmonary embolus 05/24/2014   Past Surgical History  Procedure Laterality Date  . Cesarean section    . Esophagogastroduodenoscopy N/A 05/21/2014    Procedure: ESOPHAGOGASTRODUODENOSCOPY (EGD);  Surgeon: Petra Kuba, MD;  Location: [facility] ENDOSCOPY;  Service: Endoscopy;  Laterality: N/A;   History reviewed. No pertinent family history. History  Substance Use Topics  . Smoking status: Current Every Day Smoker -- 0.25 packs/day    Types: Cigarettes  . Smokeless tobacco: Never Used  . Alcohol Use: No   OB History    No data available     Review of Systems  Constitutional: Positive for fever. Negative for chills.  Eyes: Positive for visual disturbance.  Respiratory: Positive for cough and shortness of breath. Negative for chest tightness.   Cardiovascular: Negative for chest pain.  Gastrointestinal: Positive for nausea, abdominal pain and diarrhea. Negative for vomiting, constipation, blood in stool and anal bleeding.  Musculoskeletal: Positive for myalgias.  Neurological: Positive for syncope and light-headedness. Negative for dizziness, weakness and numbness.      Allergies  Iohexol; Spiriva handihaler; Ativan; and Penicillins  Home Medications   Prior to Admission medications   Medication Sig Start Date End Date Taking? Authorizing Provider  alprazolam Prudy Feeler) 2 MG tablet Take 0.5 tablets (1 mg total) by mouth 3 (three) times daily as needed for sleep or anxiety. 08/02/14  Yes Kathlen Mody, MD  apixaban (ELIQUIS) 5 MG TABS tablet Take 1 tablet (5 mg total) by mouth 2 (two) times daily. 08/02/14  Yes Kathlen Mody, MD  feeding supplement, RESOURCE  BREEZE, (RESOURCE BREEZE) LIQD Take 1 Container by mouth 3 (three) times daily between meals. 08/02/14  Yes Kathlen Mody, MD  folic acid (FOLVITE) 1 MG tablet Take 1 tablet (1 mg total) by mouth daily. 08/02/14  Yes Kathlen Mody, MD  gabapentin (NEURONTIN) 800 MG tablet Take 1 tablet (800 mg total) by mouth 3 (three)  times daily. 08/02/14  Yes Kathlen Mody, MD  HYDROcodone-acetaminophen (NORCO) 10-325 MG per tablet Take 1 tablet by mouth every 6 (six) hours as needed. for pain 11/18/14  Yes Historical Provider, MD  HYDROmorphone (DILAUDID) 4 MG tablet Take 4 mg by mouth as needed for severe pain.  12/02/14  Yes Historical Provider, MD  levothyroxine (SYNTHROID, LEVOTHROID) 25 MCG tablet Take 1 tablet (25 mcg total) by mouth daily before breakfast. 08/02/14  Yes Kathlen Mody, MD  lipase/protease/amylase (CREON) 36000 UNITS CPEP capsule Take 1 capsule (36,000 Units total) by mouth 3 (three) times daily with meals. 08/02/14  Yes Kathlen Mody, MD  meclizine (ANTIVERT) 25 MG tablet Take 25 mg by mouth 3 (three) times daily as needed for dizziness.   Yes Historical Provider, MD  naproxen sodium (ANAPROX) 220 MG tablet Take 220 mg by mouth as needed (for pain).   Yes Historical Provider, MD  ondansetron (ZOFRAN) 4 MG tablet Take 4 mg by mouth every 8 (eight) hours as needed for nausea or vomiting.   Yes Historical Provider, MD  rosuvastatin (CRESTOR) 20 MG tablet Take 1 tablet (20 mg total) by mouth daily. 08/02/14  Yes Kathlen Mody, MD  sucralfate (CARAFATE) 1 G tablet Take 1 tablet (1 g total) by mouth 2 (two) times daily. 08/02/14  Yes Kathlen Mody, MD  sulfaSALAzine (AZULFIDINE) 500 MG tablet Take 2 tablets (1,000 mg total) by mouth daily. 08/02/14  Yes Kathlen Mody, MD  thiamine (VITAMIN B-1) 100 MG tablet Take 1 tablet (100 mg total) by mouth daily. 08/02/14  Yes Kathlen Mody, MD  traZODone (DESYREL) 25 mg TABS tablet Take 0.5 tablets (25 mg total) by mouth at bedtime as needed for sleep. 08/02/14  Yes Kathlen Mody, MD  venlafaxine (EFFEXOR) 25 MG tablet Take 1 tablet (25 mg total) by mouth 2 (two) times daily. 08/02/14  Yes Kathlen Mody, MD  Water For Irrigation, Sterile (FREE WATER) SOLN Place 200 mLs into feeding tube every 8 (eight) hours. 08/02/14  Yes Kathlen Mody, MD  HYDROmorphone (DILAUDID) 2 MG tablet Take 0.5 tablets  (1 mg total) by mouth every 6 (six) hours as needed for moderate pain or severe pain. 08/02/14   Kathlen Mody, MD  pantoprazole (PROTONIX) 20 MG tablet Take 2 tablets (40 mg total) by mouth daily. 08/02/14   Kathlen Mody, MD   BP 117/83 mmHg  Pulse 84  Temp(Src) 99.6 F (37.6 C) (Rectal)  Resp 20  Ht 5\' 3"  (1.6 m)  Wt 90 lb (40.824 kg)  BMI 15.95 kg/m2  SpO2 100%  LMP 10/01/2010 Physical Exam  Constitutional: She is oriented to person, place, and time. No distress.  Frail-appearing elderly female  HENT:  Head: Normocephalic and atraumatic.  Dry mucus membranes  Eyes: Conjunctivae and EOM are normal. Pupils are equal, round, and reactive to light. Right eye exhibits no discharge. Left eye exhibits no discharge.  Neck: Normal range of motion. Neck supple.  Cardiovascular: Regular rhythm and normal heart sounds.  Exam reveals no friction rub.   No murmur heard. Pulses:      Radial pulses are 2+ on the right side, and 2+ on the left side.  Dorsalis pedis pulses are 2+ on the right side, and 2+ on the left side.  Pulmonary/Chest: Effort normal and breath sounds normal. No respiratory distress. She has no wheezes. She has no rales.  Abdominal: Soft. Bowel sounds are normal. She exhibits no distension. There is tenderness. There is no rebound and no guarding.  Diffuse tenderness upon palpation to the abdomen   Genitourinary:  Rectal exam: Mild irritation identified around the anus - right irritated skin secondary to diarrhea. Negative bright red blood per rectum. Negative blood on glove. Strong sphincter. Exam chaperoned with nurse, Silver Huguenin.   Musculoskeletal: Normal range of motion.  Full ROM to upper and lower extremities without difficulty noted, negative ataxia noted.  Neurological: She is alert and oriented to person, place, and time. No cranial nerve deficit. She exhibits normal muscle tone. Coordination normal.  Cranial nerves III-XII grossly intact Strength 5+/5+ to upper  and lower extremities bilaterally with resistance applied, equal distribution noted Equal grip strength Negative facial droop Negative slurred speech Negative aphasia Patient follows commands well Patient responds to questions appropriately  Skin: She is not diaphoretic. There is pallor.  Psychiatric: She has a normal mood and affect. Her behavior is normal. Thought content normal.  Nursing note and vitals reviewed.   ED Course  Procedures (including critical care time)  Results for orders placed or performed during the hospital encounter of 12/28/14  CBC with Differential/Platelet  Result Value Ref Range   WBC 12.7 (H) 4.0 - 10.5 K/uL   RBC 4.18 3.87 - 5.11 MIL/uL   Hemoglobin 11.9 (L) 12.0 - 15.0 g/dL   HCT 16.1 09.6 - 04.5 %   MCV 92.1 78.0 - 100.0 fL   MCH 28.5 26.0 - 34.0 pg   MCHC 30.9 30.0 - 36.0 g/dL   RDW 40.9 (H) 81.1 - 91.4 %   Platelets 307 150 - 400 K/uL   Neutrophils Relative % 75 43 - 77 %   Neutro Abs 9.5 (H) 1.7 - 7.7 K/uL   Lymphocytes Relative 21 12 - 46 %   Lymphs Abs 2.6 0.7 - 4.0 K/uL   Monocytes Relative 4 3 - 12 %   Monocytes Absolute 0.6 0.1 - 1.0 K/uL   Eosinophils Relative 0 0 - 5 %   Eosinophils Absolute 0.0 0.0 - 0.7 K/uL   Basophils Relative 0 0 - 1 %   Basophils Absolute 0.0 0.0 - 0.1 K/uL  Comprehensive metabolic panel  Result Value Ref Range   Sodium 139 135 - 145 mmol/L   Potassium 3.1 (L) 3.5 - 5.1 mmol/L   Chloride 102 96 - 112 mmol/L   CO2 15 (L) 19 - 32 mmol/L   Glucose, Bld 97 70 - 99 mg/dL   BUN 9 6 - 23 mg/dL   Creatinine, Ser 7.82 (H) 0.50 - 1.10 mg/dL   Calcium 7.4 (L) 8.4 - 10.5 mg/dL   Total Protein 5.7 (L) 6.0 - 8.3 g/dL   Albumin 2.3 (L) 3.5 - 5.2 g/dL   AST 44 (H) 0 - 37 U/L   ALT 23 0 - 35 U/L   Alkaline Phosphatase 159 (H) 39 - 117 U/L   Total Bilirubin 0.9 0.3 - 1.2 mg/dL   GFR calc non Af Amer 47 (L) >90 mL/min   GFR calc Af Amer 55 (L) >90 mL/min   Anion gap 22 (H) 5 - 15  Troponin I  Result Value Ref Range    Troponin I 0.13 (H) <0.031 ng/mL  Brain natriuretic peptide  Result Value Ref Range   B Natriuretic Peptide 77.5 0.0 - 100.0 pg/mL  Protime-INR  Result Value Ref Range   Prothrombin Time 13.6 11.6 - 15.2 seconds   INR 1.03 0.00 - 1.49  Lipase, blood  Result Value Ref Range   Lipase 18 11 - 59 U/L  CBG monitoring, ED  Result Value Ref Range   Glucose-Capillary 119 (H) 70 - 99 mg/dL  POC occult blood, ED  Result Value Ref Range   Fecal Occult Bld POSITIVE (A) NEGATIVE    Labs Review Labs Reviewed  CBC WITH DIFFERENTIAL/PLATELET - Abnormal; Notable for the following:    WBC 12.7 (*)    Hemoglobin 11.9 (*)    RDW 16.5 (*)    Neutro Abs 9.5 (*)    All other components within normal limits  COMPREHENSIVE METABOLIC PANEL - Abnormal; Notable for the following:    Potassium 3.1 (*)    CO2 15 (*)    Creatinine, Ser 1.25 (*)    Calcium 7.4 (*)    Total Protein 5.7 (*)    Albumin 2.3 (*)    AST 44 (*)    Alkaline Phosphatase 159 (*)    GFR calc non Af Amer 47 (*)    GFR calc Af Amer 55 (*)    Anion gap 22 (*)    All other components within normal limits  TROPONIN I - Abnormal; Notable for the following:    Troponin I 0.13 (*)    All other components within normal limits  CBG MONITORING, ED - Abnormal; Notable for the following:    Glucose-Capillary 119 (*)    All other components within normal limits  POC OCCULT BLOOD, ED - Abnormal; Notable for the following:    Fecal Occult Bld POSITIVE (*)    All other components within normal limits  URINE CULTURE  CULTURE, BLOOD (ROUTINE X 2)  CULTURE, BLOOD (ROUTINE X 2)  BRAIN NATRIURETIC PEPTIDE  PROTIME-INR  LIPASE, BLOOD  URINALYSIS, ROUTINE W REFLEX MICROSCOPIC  OCCULT BLOOD X 1 CARD TO LAB, STOOL    Imaging Review Ct Abdomen Pelvis Wo Contrast  12/28/2014   CLINICAL DATA:  Diarrhea.  Near syncope.  Weakness.  EXAM: CT ABDOMEN AND PELVIS WITHOUT CONTRAST  TECHNIQUE: Multidetector CT imaging of the abdomen and pelvis was  performed following the standard protocol without IV contrast.  COMPARISON:  CT scan dated 05/07/2014 and MRI of the abdomen dated 07/30/2014  FINDINGS: There is new diffuse hepatic steatosis. Stable 2.1 cm cyst in the anterior aspect of the dome of the right lobe of the liver. Diffuse chronic biliary ductal dilatation, unchanged. The gallbladder is not distended.  Spleen, pancreas, adrenal glands, and kidneys are normal except for tiny calcifications in the upper pole of the right kidney and in the mid left kidney. No hydronephrosis.  There is fluid throughout the large and small bowel including prominent fluid in the rectum. There are no dilated loops of bowel. The terminal ileum and appendix appear normal. Uterus is normal. Left ovary appears normal. Right ovary is obscured by the bowel.  No ascites.  No free air.  No acute osseous abnormality.  IMPRESSION: 1. Interval development of extensive hepatic steatosis. 2. Fluid throughout the large and small bowel suggesting gastroenteritis. 3. Tiny bilateral renal calculi, stable. 4. Chronic of biliary ductal dilatation unchanged.   Electronically Signed   By: Francene Boyers M.D.   On: 12/28/2014 20:50   Ct Head Wo Contrast  12/28/2014   CLINICAL DATA:  Near syncope today while using the commode. Weakness for several days.  EXAM: CT HEAD WITHOUT CONTRAST  TECHNIQUE: Contiguous axial images were obtained from the base of the skull through the vertex without intravenous contrast.  COMPARISON:  Head CT 08/01/2014  FINDINGS: No intracranial hemorrhage, mass effect, or midline shift. No hydrocephalus. The basilar cisterns are patent. No evidence of territorial infarct. No intracranial fluid collection. Mild atrophy, similar to prior exam. Atherosclerosis noted about the intracranial vasculature at the skull base. Calvarium is intact. Included paranasal sinuses and mastoid air cells are well aerated.  IMPRESSION: No acute intracranial abnormality.   Electronically Signed    By: Rubye Oaks M.D.   On: 12/28/2014 20:43   Dg Chest Port 1 View  12/28/2014   CLINICAL DATA:  54 year old female with 1 day history of syncope. Hypertension.  EXAM: PORTABLE CHEST - 1 VIEW  COMPARISON:  Chest x-ray 07/30/2014.  FINDINGS: Lung volumes are normal. No consolidative airspace disease. No pleural effusions. No pneumothorax. No pulmonary nodule or mass noted. Pulmonary vasculature and the cardiomediastinal silhouette are within normal limits.  IMPRESSION: No radiographic evidence of acute cardiopulmonary disease.   Electronically Signed   By: Trudie Reed M.D.   On: 12/28/2014 21:35     EKG Interpretation None       Orthostatic VS for the past 24 hrs:  BP- Lying Pulse- Lying BP- Sitting Pulse- Sitting BP- Standing at 0 minutes Pulse- Standing at 0 minutes  12/28/14 1843 95/77 mmHg 107 (!) 88/69 mmHg 118 - 166     ED ECG REPORT   Date: 12/28/2014  Rate: 120  Rhythm: sinus tachycardia  QRS Axis: normal  Intervals: normal  ST/T Wave abnormalities: normal  Conduction Disutrbances:none  Narrative Interpretation:   Old EKG Reviewed: unchanged  I have personally reviewed the EKG tracing and agree with the computerized printout as noted.  10:18 PM Spoke with Dr. Walker Shadow, internal medicine teaching services. Discussed case, labs, imaging, vitals, ED course in great detail. Patient to be admitted. Internal medicine teaching services to come and assess patient and placed admission orders.  10:45 PM This provider spoke with Dr. Adora Fridge, Cardiology. Discussed case in great detail regarding elevated troponin. Cardiology to assess patient.   MDM   Final diagnoses:  Elevated troponin  Acute kidney injury  Gastroenteritis  Diarrhea  Crohn's disease, unspecified complication    Medications  sodium chloride 0.9 % bolus 1,000 mL (0 mLs Intravenous Stopped 12/28/14 2156)  sodium chloride 0.9 % bolus 1,000 mL (1,000 mLs Intravenous New Bag/Given 12/28/14 2156)     Filed Vitals:   12/28/14 2030 12/28/14 2100 12/28/14 2130 12/28/14 2145  BP: 130/87 112/85 96/78 117/83  Pulse: 106 116 91 84  Temp:      TempSrc:      Resp: 17 19 23 20   Height:      Weight:      SpO2: 99% 100% 99% 100%   EKG noted sinus tachycardia with a heart rate of 1 45 bpm. Troponin mildly elevated at 0.13. BNP negative elevation. CBC noted elevated white blood cell count of 12.7. Hemoglobin 11.9, hematocrit 38.5 - appears to have low Hgb, 5 months ago was 11.2. CMP noted mildly low potassium 3.1. Creatinine 1.25 - elevated when compared to 4 months ago when he was 0.54. Glucose 97, elevated anion gap of 22. Negative elevated lipase. Fecal occult positive. Portable chest x-ray no evidence of acute cardiopulmonary disease. CT head no acute intracranial abnormality. CT abdomen and pelvis without  contrast noted interval development of extensive hepatic stenosis. Fluid throughout the large and small bowel suggesting gastroenteritis. Tiny bilateral renal calculi, stable. Chronic biliary duct dilation unchanged. Negative findings of pneumonia. Urine pending. Patient presented to the ED with tachycardia that is now resolved after IV hydration is been administered, patient's initial heart rate was 145 bpm has decreased to 84 bpm. EKG noted sinus tachycardia with negative acute abnormalities-elevated troponin 0.13, high suspicion of demand ischemia. Elevated creatinine compared to 4 months ago from 0.54 to 1.25. Fecal occult was positive with negative drop in hemoglobin. CT abdomen and pelvis with contrast identified a gastroenteritis. Rule out possible Crohn's flareup secondary to patient having history of with diarrhea that is been continuous. Patient continuously hydrated in the ED setting. Discussed case in great detail with internal medicine teaching services. Patient to be admitted regarding current situation. Discussed plan for admission with patient who agrees to plan of care. Patient stable  for transfer to floor.   Raymon Mutton, PA-C 12/28/14 2246  Doug Sou, MD 12/29/14 0040

## 2014-12-28 NOTE — ED Notes (Signed)
Report from hope, rn.  Pt care assumed 

## 2014-12-28 NOTE — ED Notes (Addendum)
Pt unable to urinate at this time.  

## 2014-12-28 NOTE — H&P (Signed)
Date: 12/28/2014               Patient Name:  [name] MRN: [id number]  DOB: 08/11/1961 Age / Sex: 54 y.o., female   PCP: Clelia Schaumann, MD         Medical Service: Internal Medicine Teaching Service         Attending Physician: Dr. Levert Feinstein, MD    First Contact: Dr. Jill Alexanders  Pager: [phone]  Second Contact: Dr. Inocente Salles  Pager: [phone]       After Hours (After 5p/  First Contact Pager: [phone]  weekends / holidays): Second Contact Pager: [phone]   Chief Complaint: Diarrhea, syncope, dizziness  History of Present Illness: [name] is a 54 year old female with chronic diarrhea of unknown etiology [Crohn's disease versus pancreatic insufficiency], history of acute bilateral PE on anticoagulation, type 2 diabetes with neuropathy who presented to the ED with diarrhea, syncope, dizziness.  3 days ago, she reports going to her orthopedist who she sees for her chronic back pain, coming home, and waking up finding herself covered in diarrhea. Since then, she has nonbloody, watery stools roughly every 30 minutes. 2 days ago, she reports getting up out of bed and then "passing out" and falling back into her bed along with associated dizziness but denies hitting her head. She reports normally that she has 2-3 formed, nonbloody watery stools daily. She also reports associated nausea, headache, midepigastric/right upper quadrant abdominal pain, history of weight loss though unsure though no vomiting, recent changes to her diet, recent travel, sick contacts, shortness of breath, chest pain. She reports not missing any of her medications, including Creon and sulfasalazine, though does acknowledge occasionally missing her Eliquis for which she was put on after being found to have acute bilateral PE when she was hospitalized in August 2015. She was at home with her son and has smoked a quarter pack of cigarettes per day since the age of 54 though no recent alcohol or  illicit drug use.   Per chart review, she had a colonoscopy performed by Dr. [name] [GI] on 11/12/2002 with findings suggestive of Crohn's disease given universal proctocolitis [friable rectocolonic mucosa associated with deep ulcers adjacent normal appearing mucosa] though biopsy findings were notable for Crohn's disease versus infectious colitis. Most recently, she was hospitalized in August 2015 and late October 2015 for SIRS and thought to have dehydration secondary to diarrhea. Workup for infectious etiologies has been thus far negative. EGD 05/21/2014 with questionable mild gastritis versus gastropathy but no signs of active bleeding or lesions. MR/MRCP 07/30/2014 notable for diffuse biliary ductal dilatation and mild pancreatic ductal dilatation. Per Dr. Dulce Sellar [GI] on her most recent hospitalization in October 2015, diagnosis of Crohn's disease was questionable given no findings on most recent abnormal CT as well as MRI/MRCP, and it was suspected that this may be malabsorption secondary to pancreatic insufficiency given prior history of chronic pancreatitis from alcohol abuse. She was discharged on trial of pancreatic enzymes and low-fat diet with follow-up as outpatient reports following up with Dr. Ewing Schlein about a month ago at which time she was planned to have a repeat colonoscopy.   In the ED, she received 3 L normal saline bolus was found to be FOBT positive. Abdominal CT notable for chronic biliary ductal dilatation unchanged from prior MRI/MRCP, extensive hepatic steatosis as compared to prior imaging, fluid throughout the large and small bowel suggestive of gastroenteritis. Cardiology was consulted for elevated troponin 0.13.  Meds: Current Facility-Administered Medications  Medication Dose Route Frequency Provider Last Rate Last Dose  . potassium chloride SA (K-DUR,KLOR-CON) CR tablet 40 mEq  40 mEq Oral Once Genelle Gather, MD       Current Outpatient Prescriptions  Medication  Sig Dispense Refill  . alprazolam (XANAX) 2 MG tablet Take 0.5 tablets (1 mg total) by mouth 3 (three) times daily as needed for sleep or anxiety. 10 tablet 0  . apixaban (ELIQUIS) 5 MG TABS tablet Take 1 tablet (5 mg total) by mouth 2 (two) times daily. 60 tablet 0  . feeding supplement, RESOURCE BREEZE, (RESOURCE BREEZE) LIQD Take 1 Container by mouth 3 (three) times daily between meals.  0  . folic acid (FOLVITE) 1 MG tablet Take 1 tablet (1 mg total) by mouth daily. 30 tablet 0  . gabapentin (NEURONTIN) 800 MG tablet Take 1 tablet (800 mg total) by mouth 3 (three) times daily. 90 tablet 0  . HYDROcodone-acetaminophen (NORCO) 10-325 MG per tablet Take 1 tablet by mouth every 6 (six) hours as needed. for pain  0  . HYDROmorphone (DILAUDID) 4 MG tablet Take 4 mg by mouth as needed for severe pain.   0  . levothyroxine (SYNTHROID, LEVOTHROID) 25 MCG tablet Take 1 tablet (25 mcg total) by mouth daily before breakfast. 30 tablet 0  . lipase/protease/amylase (CREON) 36000 UNITS CPEP capsule Take 1 capsule (36,000 Units total) by mouth 3 (three) times daily with meals. 90 capsule 1  . meclizine (ANTIVERT) 25 MG tablet Take 25 mg by mouth 3 (three) times daily as needed for dizziness.    . naproxen sodium (ANAPROX) 220 MG tablet Take 220 mg by mouth as needed (for pain).    . ondansetron (ZOFRAN) 4 MG tablet Take 4 mg by mouth every 8 (eight) hours as needed for nausea or vomiting.    . rosuvastatin (CRESTOR) 20 MG tablet Take 1 tablet (20 mg total) by mouth daily. 30 tablet 0  . sucralfate (CARAFATE) 1 G tablet Take 1 tablet (1 g total) by mouth 2 (two) times daily. 60 tablet 0  . sulfaSALAzine (AZULFIDINE) 500 MG tablet Take 2 tablets (1,000 mg total) by mouth daily. 30 tablet 0  . thiamine (VITAMIN B-1) 100 MG tablet Take 1 tablet (100 mg total) by mouth daily. 30 tablet 0  . traZODone (DESYREL) 25 mg TABS tablet Take 0.5 tablets (25 mg total) by mouth at bedtime as needed for sleep. 30 tablet 0  .  venlafaxine (EFFEXOR) 25 MG tablet Take 1 tablet (25 mg total) by mouth 2 (two) times daily. 30 tablet 0  . Water For Irrigation, Sterile (FREE WATER) SOLN Place 200 mLs into feeding tube every 8 (eight) hours.    Marland Kitchen HYDROmorphone (DILAUDID) 2 MG tablet Take 0.5 tablets (1 mg total) by mouth every 6 (six) hours as needed for moderate pain or severe pain. 10 tablet 0  . pantoprazole (PROTONIX) 20 MG tablet Take 2 tablets (40 mg total) by mouth daily. 30 tablet 1    Allergies: Allergies as of 12/28/2014 - Review Complete 12/28/2014  Allergen Reaction Noted  . Iohexol Other (See Comments) 11/01/2003  . Spiriva handihaler [tiotropium bromide monohydrate] Nausea And Vomiting 07/26/2014  . Ativan [lorazepam] Other (See Comments) 12/28/2014  . Penicillins Hives 05/12/2007   Past Medical History  Diagnosis Date  . Glaucoma   . Diabetes mellitus without complication   . Hypertension   . Neuropathy   . Crohn disease   . Asthma   .  Pulmonary embolus 05/24/2014   Past Surgical History  Procedure Laterality Date  . Cesarean section    . Esophagogastroduodenoscopy N/A 05/21/2014    Procedure: ESOPHAGOGASTRODUODENOSCOPY (EGD);  Surgeon: Petra Kuba, MD;  Location: [facility];  Service: Endoscopy;  Laterality: N/A;   History reviewed. No pertinent family history. History   Social History  . Marital Status: Divorced    Spouse Name: N/A  . Number of Children: N/A  . Years of Education: N/A   Occupational History  . Not on file.   Social History Main Topics  . Smoking status: Current Every Day Smoker -- 0.25 packs/day    Types: Cigarettes  . Smokeless tobacco: Never Used  . Alcohol Use: No  . Drug Use: No  . Sexual Activity: Not on file   Other Topics Concern  . Not on file   Social History Narrative    Review of Systems: As noted in the history of present illness  Physical Exam: Blood pressure 117/83, pulse 84, temperature 99.6 F (37.6 C), temperature source Rectal, resp.  rate 20, height 5\' 3"  (1.6 m), weight 90 lb (40.824 kg), last menstrual period 10/01/2010, SpO2 100 %.  General: Thin African-American female, resting in bed, NAD HEENT: PERRL, EOMI, no scleral icterus, oropharynx clear Cardiac: Sinus tachycardia, no rubs, murmurs or gallops Pulm: clear to auscultation bilaterally though auscultation limited by patient effort, no wheezes, rales, or rhonchi Abd: Soft, normoactive bowel sounds, tenderness to palpation overlying the epigastric area and right upper quadrant area, umbilical scar from prior procedure Ext: warm and well perfused, no pedal or tibial edema Neuro: CN II-XII intact, 4 out of 5 upper and lower extremity strength limited by patient effort, 2+ grip strength, finger-to-nose intact     Lab results: Basic Metabolic Panel:  Recent Labs  58/52/77 1905  NA 139  K 3.1*  CL 102  CO2 15*  GLUCOSE 97  BUN 9  CREATININE 1.25*  CALCIUM 7.4*   Liver Function Tests:  Recent Labs  12/28/14 1905  AST 44*  ALT 23  ALKPHOS 159*  BILITOT 0.9  PROT 5.7*  ALBUMIN 2.3*    Recent Labs  12/28/14 1905  LIPASE 18   CBC:  Recent Labs  12/28/14 1905  WBC 12.7*  NEUTROABS 9.5*  HGB 11.9*  HCT 38.5  MCV 92.1  PLT 307   Cardiac Enzymes:  Recent Labs  12/28/14 1905  TROPONINI 0.13*   CBG:  Recent Labs  12/28/14 1850  GLUCAP 119*   Coagulation:  Recent Labs  12/28/14 1905  LABPROT 13.6  INR 1.03    Imaging results:  Ct Abdomen Pelvis Wo Contrast  12/28/2014   CLINICAL DATA:  Diarrhea.  Near syncope.  Weakness.  EXAM: CT ABDOMEN AND PELVIS WITHOUT CONTRAST  TECHNIQUE: Multidetector CT imaging of the abdomen and pelvis was performed following the standard protocol without IV contrast.  COMPARISON:  CT scan dated 05/07/2014 and MRI of the abdomen dated 07/30/2014  FINDINGS: There is new diffuse hepatic steatosis. Stable 2.1 cm cyst in the anterior aspect of the dome of the right lobe of the liver. Diffuse chronic  biliary ductal dilatation, unchanged. The gallbladder is not distended.  Spleen, pancreas, adrenal glands, and kidneys are normal except for tiny calcifications in the upper pole of the right kidney and in the mid left kidney. No hydronephrosis.  There is fluid throughout the large and small bowel including prominent fluid in the rectum. There are no dilated loops of bowel. The terminal ileum  and appendix appear normal. Uterus is normal. Left ovary appears normal. Right ovary is obscured by the bowel.  No ascites.  No free air.  No acute osseous abnormality.  IMPRESSION: 1. Interval development of extensive hepatic steatosis. 2. Fluid throughout the large and small bowel suggesting gastroenteritis. 3. Tiny bilateral renal calculi, stable. 4. Chronic of biliary ductal dilatation unchanged.   Electronically Signed   By: Francene Boyers M.D.   On: 12/28/2014 20:50   Ct Head Wo Contrast  12/28/2014   CLINICAL DATA:  Near syncope today while using the commode. Weakness for several days.  EXAM: CT HEAD WITHOUT CONTRAST  TECHNIQUE: Contiguous axial images were obtained from the base of the skull through the vertex without intravenous contrast.  COMPARISON:  Head CT 08/01/2014  FINDINGS: No intracranial hemorrhage, mass effect, or midline shift. No hydrocephalus. The basilar cisterns are patent. No evidence of territorial infarct. No intracranial fluid collection. Mild atrophy, similar to prior exam. Atherosclerosis noted about the intracranial vasculature at the skull base. Calvarium is intact. Included paranasal sinuses and mastoid air cells are well aerated.  IMPRESSION: No acute intracranial abnormality.   Electronically Signed   By: Rubye Oaks M.D.   On: 12/28/2014 20:43   Dg Chest Port 1 View  12/28/2014   CLINICAL DATA:  54 year old female with 1 day history of syncope. Hypertension.  EXAM: PORTABLE CHEST - 1 VIEW  COMPARISON:  Chest x-ray 07/30/2014.  FINDINGS: Lung volumes are normal. No consolidative  airspace disease. No pleural effusions. No pneumothorax. No pulmonary nodule or mass noted. Pulmonary vasculature and the cardiomediastinal silhouette are within normal limits.  IMPRESSION: No radiographic evidence of acute cardiopulmonary disease.   Electronically Signed   By: Trudie Reed M.D.   On: 12/28/2014 21:35    Other results: EKG: Reviewed and compared with 07/26/2014 Motion artifact noted throughout Rate: tachycardic, mostly normal with intermittent regularity Normal axis     Assessment & Plan by Problem:  SIRS secondary to acute diarrheal illness: She meets SIRS criteria given her tachycardia and hypotension in the ED the does not have any clear source of infection despite elevated white count. Abdominal CT findings suggestive of acute gastroenteritis to prior infectious workups have been negative. Her current presentation appears consistent with prior hospitalizations which is suggestive of a chronic process. Previous diagnosis of Crohn's disease has been questioned. Original biopsy report noted above could not be found in the electronic chart. Abnormal LFTs consistent with imaging findings of hepatic steatosis and chronic ductal dilatation. She denied any recent alcohol use the time of interview though has had a history of alcohol abuse per chart review with presumed secondary liver disease though recent imaging findings not consistent with pancreatic insufficiency. Other possible causes of her hepatic steatosis include nonalcoholic fatty liver disease and starvation. Lipase reassuring for no acute pancreatitis. FOBT positive in the ED though hemoglobin appears stable. -Check orthostatics and continue NS @ 150cc/hr -Check C. difficile PCR, GI pathogen panel, fecal lactoferrin, CRP  -Check serum ethanol, UDS -Follow-up urine culture, blood culture 2 collected in the emergency department -Repeat CBC  -Continue Zofran 4 mg every 8 hours as needed for nausea/vomiting -Continue  Protonix 40 mg -Continue sucralfate 1 g twice daily -Continue sulfasalazine 1000 mg -Continue Creon 36,000 units 3 times daily with meals -Check HIV, acute hepatitis panel -Consult GI in the morning  Orthostatic syncope: Likely setting of her acute diarrheal illness. Head CT unremarkable. Unsure if she has a component of vertigo given that she  is on Antivert 25 mg 3 times daily as needed. She did report falling a few weeks ago and hurting her knee, shoulder, neck, foot. -Continue Antivert -Orthostatics as noted above  Acute kidney injury with anion gap metabolic acidosis: Creatinine 1.3, baseline 0.5 0.6. Likely secondary to her acute diarrheal illness given bicarbonate losses with presumed increase in lactate. -Check UA for ketones, lactate -Recheck BMET -Give K-Dur 40 mEq and check Mg  Elevated troponin: She had no chest pain associated with her current presentation. Most recent echo 03/26/2003 notable for EF 55-60% without wall motion abnormalities. No known prior cardiac history. -Repeat EKG, troponin 3 -Monitor on telemetry -Follow up Cardiology recommendations  History of acute bilateral PE: At the time of that admission, she presented with acute respiratory failure and had an elevated d-dimer but was thought to be in the setting of possible sepsis. Acute nonobstructive bilateral PE noted on CTA 05/08/2014. She does report missing doses at night as she has insomnia sometimes forgets to take her medications. Unclear if provoked or unprovoked given that she reports being in a similar condition prior to that admission with no family history or personal history of blood clots. -Continue Eliquis 5 mg twice daily but consider discontinuing it  Subjective weight loss: She reports losing weight though could not report in interval time over which this isn't happening. Reviewing her reported weights over the past year, it has fluctuated between roughly 80-110 pounds, both values recorded it appears  from her same hospitalization which certainly raises doubt on the accuracy. Anorexia has been mentioned as a possible cause by past providers in addition to secondary malabsorption from pancreatic insufficiency. Corrected calcium 8.8. -Consider nutrition consult -Continue feeding supplement -Continue vitamin B1 100 mg -Continue folate -Confirm with PCP tomorrow  Chronic pain/anxiety: Per [facility] database, she last received Valium 5 mg 90 tablets on 12/13/14 from her PCP, Dilaudid 4 mg 42 tablets by Dr. Lunette Stands on 12/02/14, Vicodin 10/325 mg  60 tablets by PA Margart Sickles on 11/18/14. Other psychiatric medications that she has include trazodone 25 mg at bedtime as needed for sleep, Effexor 25 mg twice daily. -Continue Norco, Xanax, trazodone, Effexor -Hold Dilaudid   Type 2 diabetes with neuropathy: A1c 4.9, 05/08/14. She does not appear to be on any home medications. -Continue Crestor 20 mg -Continue gabapentin 800 mg 3 times daily -Confirm with PCP tomorrow  Hypothyroidism: TSH 0.935, 07/31/2014. -Check TSH -Continue Synthroid 25 g  #FEN:  -Diet: Nothing by mouth  #DVT prophylaxis: Eliquis  #CODE STATUS: FULL CODE -Defer to her son [facility] [phone]] if patients lacks decision-making capacity -Confirmed with patient on admission   Dispo: Disposition is deferred at this time, awaiting improvement of current medical problems.   The patient does have a current PCP (Ibethal Georgette Dover, MD) and does not need an [facility] hospital follow-up appointment after discharge.  The patient does not know have transportation limitations that hinder transportation to clinic appointments.  Signed: Beather Arbour, MD 12/28/2014, 10:22 PM

## 2014-12-28 NOTE — ED Notes (Signed)
Pt again reporting 10/10 pain, requesting pain medicine.  PA [place] again notified, RN requesting PA discuss plan of care with patient.

## 2014-12-29 ENCOUNTER — Other Ambulatory Visit (HOSPITAL_COMMUNITY): Payer: Self-pay

## 2014-12-29 DIAGNOSIS — R55 Syncope and collapse: Secondary | ICD-10-CM

## 2014-12-29 DIAGNOSIS — E872 Acidosis: Secondary | ICD-10-CM | POA: Diagnosis present

## 2014-12-29 DIAGNOSIS — R7989 Other specified abnormal findings of blood chemistry: Secondary | ICD-10-CM | POA: Diagnosis present

## 2014-12-29 DIAGNOSIS — E876 Hypokalemia: Secondary | ICD-10-CM | POA: Diagnosis not present

## 2014-12-29 DIAGNOSIS — K529 Noninfective gastroenteritis and colitis, unspecified: Secondary | ICD-10-CM

## 2014-12-29 DIAGNOSIS — K5090 Crohn's disease, unspecified, without complications: Secondary | ICD-10-CM | POA: Diagnosis present

## 2014-12-29 DIAGNOSIS — N179 Acute kidney failure, unspecified: Secondary | ICD-10-CM | POA: Diagnosis not present

## 2014-12-29 DIAGNOSIS — M549 Dorsalgia, unspecified: Secondary | ICD-10-CM | POA: Diagnosis present

## 2014-12-29 DIAGNOSIS — K760 Fatty (change of) liver, not elsewhere classified: Secondary | ICD-10-CM | POA: Diagnosis present

## 2014-12-29 DIAGNOSIS — R197 Diarrhea, unspecified: Secondary | ICD-10-CM | POA: Diagnosis not present

## 2014-12-29 DIAGNOSIS — E039 Hypothyroidism, unspecified: Secondary | ICD-10-CM

## 2014-12-29 DIAGNOSIS — R740 Nonspecific elevation of levels of transaminase and lactic acid dehydrogenase [LDH]: Secondary | ICD-10-CM

## 2014-12-29 DIAGNOSIS — Z7901 Long term (current) use of anticoagulants: Secondary | ICD-10-CM | POA: Diagnosis not present

## 2014-12-29 DIAGNOSIS — M542 Cervicalgia: Secondary | ICD-10-CM | POA: Diagnosis present

## 2014-12-29 DIAGNOSIS — Z86711 Personal history of pulmonary embolism: Secondary | ICD-10-CM | POA: Diagnosis not present

## 2014-12-29 DIAGNOSIS — A419 Sepsis, unspecified organism: Secondary | ICD-10-CM

## 2014-12-29 DIAGNOSIS — I248 Other forms of acute ischemic heart disease: Secondary | ICD-10-CM

## 2014-12-29 DIAGNOSIS — D638 Anemia in other chronic diseases classified elsewhere: Secondary | ICD-10-CM | POA: Diagnosis present

## 2014-12-29 DIAGNOSIS — I509 Heart failure, unspecified: Secondary | ICD-10-CM | POA: Diagnosis not present

## 2014-12-29 DIAGNOSIS — R64 Cachexia: Secondary | ICD-10-CM

## 2014-12-29 DIAGNOSIS — R6510 Systemic inflammatory response syndrome (SIRS) of non-infectious origin without acute organ dysfunction: Secondary | ICD-10-CM

## 2014-12-29 DIAGNOSIS — F411 Generalized anxiety disorder: Secondary | ICD-10-CM | POA: Diagnosis present

## 2014-12-29 DIAGNOSIS — J9600 Acute respiratory failure, unspecified whether with hypoxia or hypercapnia: Secondary | ICD-10-CM | POA: Diagnosis present

## 2014-12-29 DIAGNOSIS — R195 Other fecal abnormalities: Secondary | ICD-10-CM | POA: Diagnosis present

## 2014-12-29 DIAGNOSIS — F419 Anxiety disorder, unspecified: Secondary | ICD-10-CM

## 2014-12-29 DIAGNOSIS — Z681 Body mass index (BMI) 19 or less, adult: Secondary | ICD-10-CM | POA: Diagnosis not present

## 2014-12-29 DIAGNOSIS — B9689 Other specified bacterial agents as the cause of diseases classified elsewhere: Secondary | ICD-10-CM | POA: Diagnosis present

## 2014-12-29 DIAGNOSIS — F17210 Nicotine dependence, cigarettes, uncomplicated: Secondary | ICD-10-CM | POA: Diagnosis present

## 2014-12-29 DIAGNOSIS — E1140 Type 2 diabetes mellitus with diabetic neuropathy, unspecified: Secondary | ICD-10-CM | POA: Diagnosis present

## 2014-12-29 DIAGNOSIS — D72829 Elevated white blood cell count, unspecified: Secondary | ICD-10-CM | POA: Diagnosis present

## 2014-12-29 DIAGNOSIS — E440 Moderate protein-calorie malnutrition: Secondary | ICD-10-CM | POA: Diagnosis present

## 2014-12-29 DIAGNOSIS — I951 Orthostatic hypotension: Secondary | ICD-10-CM | POA: Diagnosis present

## 2014-12-29 DIAGNOSIS — Z888 Allergy status to other drugs, medicaments and biological substances status: Secondary | ICD-10-CM | POA: Diagnosis not present

## 2014-12-29 DIAGNOSIS — Z880 Allergy status to penicillin: Secondary | ICD-10-CM | POA: Diagnosis not present

## 2014-12-29 DIAGNOSIS — G8929 Other chronic pain: Secondary | ICD-10-CM

## 2014-12-29 DIAGNOSIS — J45909 Unspecified asthma, uncomplicated: Secondary | ICD-10-CM | POA: Diagnosis present

## 2014-12-29 DIAGNOSIS — Z91041 Radiographic dye allergy status: Secondary | ICD-10-CM | POA: Diagnosis not present

## 2014-12-29 DIAGNOSIS — H409 Unspecified glaucoma: Secondary | ICD-10-CM | POA: Diagnosis present

## 2014-12-29 DIAGNOSIS — N390 Urinary tract infection, site not specified: Secondary | ICD-10-CM | POA: Diagnosis not present

## 2014-12-29 DIAGNOSIS — E785 Hyperlipidemia, unspecified: Secondary | ICD-10-CM | POA: Diagnosis present

## 2014-12-29 DIAGNOSIS — I10 Essential (primary) hypertension: Secondary | ICD-10-CM | POA: Diagnosis present

## 2014-12-29 DIAGNOSIS — E860 Dehydration: Secondary | ICD-10-CM | POA: Diagnosis present

## 2014-12-29 DIAGNOSIS — Z79899 Other long term (current) drug therapy: Secondary | ICD-10-CM | POA: Diagnosis not present

## 2014-12-29 DIAGNOSIS — I2699 Other pulmonary embolism without acute cor pulmonale: Secondary | ICD-10-CM | POA: Diagnosis present

## 2014-12-29 LAB — CBC
HCT: 34.2 % — ABNORMAL LOW (ref 36.0–46.0)
HEMATOCRIT: 35 % — AB (ref 36.0–46.0)
HEMOGLOBIN: 10.6 g/dL — AB (ref 12.0–15.0)
Hemoglobin: 10.6 g/dL — ABNORMAL LOW (ref 12.0–15.0)
MCH: 28.3 pg (ref 26.0–34.0)
MCH: 28.1 pg (ref 26.0–34.0)
MCHC: 31 g/dL (ref 30.0–36.0)
MCHC: 30.3 g/dL (ref 30.0–36.0)
MCV: 91.4 fL (ref 78.0–100.0)
MCV: 92.8 fL (ref 78.0–100.0)
Platelets: 267 10*3/uL (ref 150–400)
Platelets: 263 10*3/uL (ref 150–400)
RBC: 3.74 MIL/uL — ABNORMAL LOW (ref 3.87–5.11)
RBC: 3.77 MIL/uL — ABNORMAL LOW (ref 3.87–5.11)
RDW: 16.5 % — ABNORMAL HIGH (ref 11.5–15.5)
RDW: 16.6 % — AB (ref 11.5–15.5)
WBC: 12.4 10*3/uL — AB (ref 4.0–10.5)
WBC: 13 10*3/uL — AB (ref 4.0–10.5)

## 2014-12-29 LAB — COMPREHENSIVE METABOLIC PANEL
ALBUMIN: 2 g/dL — AB (ref 3.5–5.2)
ALT: 21 U/L (ref 0–35)
ANION GAP: 13 (ref 5–15)
AST: 50 U/L — AB (ref 0–37)
Alkaline Phosphatase: 142 U/L — ABNORMAL HIGH (ref 39–117)
BUN: 5 mg/dL — ABNORMAL LOW (ref 6–23)
CO2: 15 mmol/L — ABNORMAL LOW (ref 19–32)
Calcium: 6.5 mg/dL — ABNORMAL LOW (ref 8.4–10.5)
Chloride: 114 mmol/L — ABNORMAL HIGH (ref 96–112)
Creatinine, Ser: 1.03 mg/dL (ref 0.50–1.10)
GFR calc non Af Amer: 60 mL/min — ABNORMAL LOW (ref >90)
GFR, EST AFRICAN AMERICAN: 69 mL/min — AB (ref >90)
GLUCOSE: 81 mg/dL (ref 70–99)
POTASSIUM: 2.7 mmol/L — AB (ref 3.5–5.1)
SODIUM: 142 mmol/L (ref 135–145)
Total Bilirubin: 1.2 mg/dL (ref 0.3–1.2)
Total Protein: 4.7 g/dL — ABNORMAL LOW (ref 6.0–8.3)

## 2014-12-29 LAB — URINALYSIS, ROUTINE W REFLEX MICROSCOPIC
BILIRUBIN URINE: NEGATIVE
Glucose, UA: NEGATIVE mg/dL
KETONES UR: 15 mg/dL — AB
Nitrite: POSITIVE — AB
PROTEIN: NEGATIVE mg/dL
Specific Gravity, Urine: 1.01 (ref 1.005–1.030)
Urobilinogen, UA: 0.2 mg/dL (ref 0.0–1.0)
pH: 6 (ref 5.0–8.0)

## 2014-12-29 LAB — TSH: TSH: 1.508 u[IU]/mL (ref 0.350–4.500)

## 2014-12-29 LAB — TROPONIN I
TROPONIN I: 0.25 ng/mL — AB (ref <0.031)
Troponin I: 0.17 ng/mL — ABNORMAL HIGH (ref <0.031)
Troponin I: 0.15 ng/mL — ABNORMAL HIGH (ref <0.031)

## 2014-12-29 LAB — RAPID URINE DRUG SCREEN, HOSP PERFORMED
AMPHETAMINES: NOT DETECTED
BARBITURATES: NOT DETECTED
Benzodiazepines: POSITIVE — AB
Cocaine: NOT DETECTED
Opiates: NOT DETECTED
Tetrahydrocannabinol: NOT DETECTED

## 2014-12-29 LAB — HEPATITIS PANEL, ACUTE
HCV Ab: NEGATIVE
HEP B S AG: NEGATIVE
Hep A IgM: NONREACTIVE
Hep B C IgM: NONREACTIVE

## 2014-12-29 LAB — URINE MICROSCOPIC-ADD ON

## 2014-12-29 LAB — C-REACTIVE PROTEIN

## 2014-12-29 LAB — I-STAT CG4 LACTIC ACID, ED: Lactic Acid, Venous: 1.14 mmol/L (ref 0.5–2.0)

## 2014-12-29 LAB — MAGNESIUM: MAGNESIUM: 1.3 mg/dL — AB (ref 1.5–2.5)

## 2014-12-29 LAB — ETHANOL: Alcohol, Ethyl (B): <5 mg/dL (ref 0–9)

## 2014-12-29 LAB — HIV ANTIBODY (ROUTINE TESTING W REFLEX): HIV Screen 4th Generation wRfx: NONREACTIVE

## 2014-12-29 LAB — CLOSTRIDIUM DIFFICILE BY PCR: CDIFFPCR: NEGATIVE

## 2014-12-29 MED ORDER — TRAZODONE 25 MG HALF TABLET
25.0000 mg | ORAL_TABLET | Freq: Every evening | ORAL | Status: DC | PRN
  Administered 2014-12-30 – 2015-01-02 (×4): 25 mg via ORAL
  Filled 2014-12-29 (×5): qty 1

## 2014-12-29 MED ORDER — ENOXAPARIN SODIUM 40 MG/0.4ML ~~LOC~~ SOLN: 40.0000 mg | SUBCUTANEOUS | Status: DC

## 2014-12-29 MED ORDER — POTASSIUM CHLORIDE CRYS ER 20 MEQ PO TBCR
40.0000 meq | EXTENDED_RELEASE_TABLET | Freq: Once | ORAL | Status: AC
  Administered 2014-12-29: 40 meq via ORAL
  Filled 2014-12-29: qty 2

## 2014-12-29 MED ORDER — VITAMIN B-1 100 MG PO TABS
100.0000 mg | ORAL_TABLET | Freq: Every day | ORAL | Status: DC
  Administered 2014-12-29 – 2015-01-03 (×6): 100 mg via ORAL
  Filled 2014-12-29 (×6): qty 1

## 2014-12-29 MED ORDER — PANTOPRAZOLE SODIUM 40 MG PO TBEC
40.0000 mg | DELAYED_RELEASE_TABLET | Freq: Every day | ORAL | Status: DC
  Administered 2014-12-31 – 2015-01-01 (×2): 40 mg via ORAL
  Filled 2014-12-29 (×5): qty 1

## 2014-12-29 MED ORDER — CIPROFLOXACIN HCL 500 MG PO TABS
500.0000 mg | ORAL_TABLET | Freq: Two times a day (BID) | ORAL | Status: DC
  Administered 2014-12-29 – 2015-01-03 (×10): 500 mg via ORAL
  Filled 2014-12-29 (×14): qty 1

## 2014-12-29 MED ORDER — APIXABAN 5 MG PO TABS
5.0000 mg | ORAL_TABLET | Freq: Two times a day (BID) | ORAL | Status: DC
  Filled 2014-12-29 (×2): qty 1

## 2014-12-29 MED ORDER — SODIUM CHLORIDE 0.9 % IJ SOLN
3.0000 mL | Freq: Two times a day (BID) | INTRAMUSCULAR | Status: DC
  Administered 2014-12-29 – 2015-01-03 (×9): 3 mL via INTRAVENOUS
  Filled 2014-12-29: qty 3

## 2014-12-29 MED ORDER — HYDROCODONE-ACETAMINOPHEN 10-325 MG PO TABS
1.0000 | ORAL_TABLET | Freq: Four times a day (QID) | ORAL | Status: DC | PRN
  Administered 2014-12-29 – 2014-12-30 (×5): 1 via ORAL
  Filled 2014-12-29 (×5): qty 1

## 2014-12-29 MED ORDER — SUCRALFATE 1 G PO TABS
1.0000 g | ORAL_TABLET | Freq: Two times a day (BID) | ORAL | Status: DC
  Administered 2014-12-29 – 2015-01-03 (×10): 1 g via ORAL
  Filled 2014-12-29 (×13): qty 1

## 2014-12-29 MED ORDER — VENLAFAXINE HCL 25 MG PO TABS
25.0000 mg | ORAL_TABLET | Freq: Two times a day (BID) | ORAL | Status: DC
  Administered 2014-12-29 – 2015-01-03 (×10): 25 mg via ORAL
  Filled 2014-12-29 (×14): qty 1

## 2014-12-29 MED ORDER — GABAPENTIN 400 MG PO CAPS
800.0000 mg | ORAL_CAPSULE | Freq: Three times a day (TID) | ORAL | Status: DC
  Administered 2014-12-29 – 2015-01-03 (×16): 800 mg via ORAL
  Filled 2014-12-29 (×19): qty 2

## 2014-12-29 MED ORDER — ENOXAPARIN SODIUM 30 MG/0.3ML ~~LOC~~ SOLN
30.0000 mg | SUBCUTANEOUS | Status: DC
  Administered 2014-12-30 – 2015-01-03 (×5): 30 mg via SUBCUTANEOUS
  Filled 2014-12-29 (×5): qty 0.3

## 2014-12-29 MED ORDER — ONDANSETRON HCL 4 MG PO TABS
4.0000 mg | ORAL_TABLET | Freq: Three times a day (TID) | ORAL | Status: DC | PRN
  Administered 2014-12-29 – 2015-01-03 (×5): 4 mg via ORAL
  Filled 2014-12-29 (×5): qty 1

## 2014-12-29 MED ORDER — MECLIZINE HCL 25 MG PO TABS
25.0000 mg | ORAL_TABLET | Freq: Three times a day (TID) | ORAL | Status: DC | PRN
  Administered 2014-12-30: 25 mg via ORAL
  Filled 2014-12-29 (×2): qty 1

## 2014-12-29 MED ORDER — LEVOTHYROXINE SODIUM 25 MCG PO TABS
25.0000 ug | ORAL_TABLET | Freq: Every day | ORAL | Status: DC
  Administered 2014-12-29 – 2015-01-03 (×6): 25 ug via ORAL
  Filled 2014-12-29 (×7): qty 1

## 2014-12-29 MED ORDER — ASPIRIN 325 MG PO TABS
325.0000 mg | ORAL_TABLET | Freq: Once | ORAL | Status: AC
  Administered 2014-12-29: 325 mg via ORAL
  Filled 2014-12-29: qty 1

## 2014-12-29 MED ORDER — ASPIRIN EC 81 MG PO TBEC
81.0000 mg | DELAYED_RELEASE_TABLET | Freq: Every day | ORAL | Status: DC
  Administered 2014-12-30 – 2015-01-03 (×5): 81 mg via ORAL
  Filled 2014-12-29 (×5): qty 1

## 2014-12-29 MED ORDER — BOOST / RESOURCE BREEZE PO LIQD
1.0000 | Freq: Three times a day (TID) | ORAL | Status: DC
  Administered 2014-12-29 – 2015-01-03 (×11): 1 via ORAL
  Filled 2014-12-29: qty 1

## 2014-12-29 MED ORDER — POTASSIUM CHLORIDE IN NACL 40-0.9 MEQ/L-% IV SOLN
INTRAVENOUS | Status: DC
  Administered 2014-12-29 – 2014-12-30 (×2): 125 mL/h via INTRAVENOUS
  Filled 2014-12-29 (×6): qty 1000

## 2014-12-29 MED ORDER — METRONIDAZOLE 500 MG PO TABS
500.0000 mg | ORAL_TABLET | Freq: Three times a day (TID) | ORAL | Status: DC
  Administered 2014-12-29 – 2015-01-03 (×12): 500 mg via ORAL
  Filled 2014-12-29 (×18): qty 1

## 2014-12-29 MED ORDER — ENOXAPARIN SODIUM 30 MG/0.3ML ~~LOC~~ SOLN
20.0000 mg | SUBCUTANEOUS | Status: DC
  Administered 2014-12-29: 20 mg via SUBCUTANEOUS
  Filled 2014-12-29: qty 0.2

## 2014-12-29 MED ORDER — POTASSIUM CHLORIDE IN NACL 20-0.9 MEQ/L-% IV SOLN
INTRAVENOUS | Status: DC
  Administered 2014-12-29: 08:00:00 via INTRAVENOUS
  Filled 2014-12-29 (×3): qty 1000

## 2014-12-29 MED ORDER — SULFASALAZINE 500 MG PO TABS
1000.0000 mg | ORAL_TABLET | Freq: Every day | ORAL | Status: DC
  Administered 2014-12-29 – 2015-01-03 (×6): 1000 mg via ORAL
  Filled 2014-12-29 (×7): qty 2

## 2014-12-29 MED ORDER — PANCRELIPASE (LIP-PROT-AMYL) 36000-114000 UNITS PO CPEP
36000.0000 [IU] | ORAL_CAPSULE | Freq: Three times a day (TID) | ORAL | Status: DC
  Administered 2014-12-29 – 2014-12-30 (×2): 36000 [IU] via ORAL
  Filled 2014-12-29 (×7): qty 1

## 2014-12-29 MED ORDER — FOLIC ACID 1 MG PO TABS
1.0000 mg | ORAL_TABLET | Freq: Every day | ORAL | Status: DC
  Administered 2014-12-29 – 2015-01-03 (×6): 1 mg via ORAL
  Filled 2014-12-29 (×6): qty 1

## 2014-12-29 MED ORDER — SODIUM CHLORIDE 0.9 % IV SOLN
INTRAVENOUS | Status: DC
  Administered 2014-12-29: 03:00:00 via INTRAVENOUS

## 2014-12-29 MED ORDER — SODIUM CHLORIDE 0.9 % IV BOLUS (SEPSIS)
1000.0000 mL | Freq: Once | INTRAVENOUS | Status: AC
  Administered 2014-12-29: 1000 mL via INTRAVENOUS

## 2014-12-29 MED ORDER — MAGNESIUM SULFATE 2 GM/50ML IV SOLN
2.0000 g | Freq: Once | INTRAVENOUS | Status: AC
  Administered 2014-12-29: 2 g via INTRAVENOUS
  Filled 2014-12-29: qty 50

## 2014-12-29 MED ORDER — ROSUVASTATIN CALCIUM 20 MG PO TABS
20.0000 mg | ORAL_TABLET | Freq: Every day | ORAL | Status: DC
  Administered 2014-12-30 – 2015-01-02 (×3): 20 mg via ORAL
  Filled 2014-12-29 (×7): qty 1

## 2014-12-29 NOTE — ED Notes (Signed)
Lunch tray ordered 

## 2014-12-29 NOTE — ED Notes (Signed)
Pt called out and reported that she was vomiting.  Pt threw up a moderate amount of clear emesis.  PRN Zofran given and pt reports relief.

## 2014-12-29 NOTE — ED Notes (Signed)
Spoke with Admitting Dr regarding patient care.

## 2014-12-29 NOTE — ED Notes (Signed)
Admitting MD at bedside.

## 2014-12-29 NOTE — Progress Notes (Signed)
Subjective:  Patient was seen and examined this morning. She reports one more small episode of diarrhea. She has mild epigastric and RUQ pain. She denies fever, chills, nausea, vomiting, chest pain, or shortness of breath. She wants to eat. She states her flares have been better controlled recently, but reports about 3-4 flares in a year since starting sulfasalazine.   Objective: Vital signs in last 24 hours: Filed Vitals:   12/29/14 0630 12/29/14 0644 12/29/14 0700 12/29/14 0906  BP: 107/75  99/74 99/70  Pulse: 88  110 108  Temp:      TempSrc:      Resp: 16  16 20   Height:      Weight:      SpO2: 100% 98% 100% 98%   Weight change:   Intake/Output Summary (Last 24 hours) at 12/29/14 1245 Last data filed at 12/29/14 1122  Gross per 24 hour  Intake   1003 ml  Output   1750 ml  Net   -747 ml   General: Vital signs reviewed.  Patient is an thin appearing female, in no acute distress and cooperative with exam.   Cardiovascular: RRR Pulmonary/Chest: Clear to auscultation bilaterally, no wheezes, rales, or rhonchi. Abdominal: Soft, tender to palpation in epigastric and RUQ region, non-distended, BS +, no masses, organomegaly, or guarding present.  Extremities: No lower extremity edema bilaterally, thin extremities. Neurological: A&O x3 Skin: Decreased skin turgor. Warm, dry and intact. No rashes or erythema. Psychiatric: Normal mood and affect. speech and behavior is normal. Cognition and memory are normal.   Lab Results: Basic Metabolic Panel:  Recent Labs Lab 12/28/14 1905 12/29/14 0225  NA 139 142  K 3.1* 2.7*  CL 102 114*  CO2 15* 15*  GLUCOSE 97 81  BUN 9 5*  CREATININE 1.25* 1.03  CALCIUM 7.4* 6.5*  MG  --  1.3*   Liver Function Tests:  Recent Labs Lab 12/28/14 1905 12/29/14 0225  AST 44* 50*  ALT 23 21  ALKPHOS 159* 142*  BILITOT 0.9 1.2  PROT 5.7* 4.7*  ALBUMIN 2.3* 2.0*    Recent Labs Lab 12/28/14 1905  LIPASE 18   CBC:  Recent Labs Lab  12/28/14 1905 12/29/14 0225 12/29/14 0755  WBC 12.7* 12.4* 13.0*  NEUTROABS 9.5*  --   --   HGB 11.9* 10.6* 10.6*  HCT 38.5 34.2* 35.0*  MCV 92.1 91.4 92.8  PLT 307 267 263   Cardiac Enzymes:  Recent Labs Lab 12/28/14 1905 12/29/14 0220 12/29/14 0755  TROPONINI 0.13* 0.25* 0.17*   CBG:  Recent Labs Lab 12/28/14 1850  GLUCAP 119*   Thyroid Function Tests:  Recent Labs Lab 12/29/14 0220  TSH 1.508   Coagulation:  Recent Labs Lab 12/28/14 1905  LABPROT 13.6  INR 1.03   Urine Drug Screen: Drugs of Abuse     Component Value Date/Time   LABOPIA NONE DETECTED 12/29/2014 0254   LABOPIA POSITIVE* 07/30/2011 1337   COCAINSCRNUR NONE DETECTED 12/29/2014 0254   COCAINSCRNUR NEGATIVE 07/30/2011 1337   LABBENZ POSITIVE* 12/29/2014 0254   LABBENZ POSITIVE* 07/30/2011 1337   AMPHETMU NONE DETECTED 12/29/2014 0254   AMPHETMU NEGATIVE 07/30/2011 1337   THCU NONE DETECTED 12/29/2014 0254   LABBARB NONE DETECTED 12/29/2014 0254    Alcohol Level:  Recent Labs Lab 12/29/14 0220  ETH <5   Urinalysis:  Recent Labs Lab 12/29/14 0254  COLORURINE YELLOW  LABSPEC 1.010  PHURINE 6.0  GLUCOSEU NEGATIVE  HGBUR MODERATE*  BILIRUBINUR NEGATIVE  KETONESUR 15*  PROTEINUR NEGATIVE  UROBILINOGEN 0.2  NITRITE POSITIVE*  LEUKOCYTESUR MODERATE*   Studies/Results: Ct Abdomen Pelvis Wo Contrast  12/28/2014   CLINICAL DATA:  Diarrhea.  Near syncope.  Weakness.  EXAM: CT ABDOMEN AND PELVIS WITHOUT CONTRAST  TECHNIQUE: Multidetector CT imaging of the abdomen and pelvis was performed following the standard protocol without IV contrast.  COMPARISON:  CT scan dated 05/07/2014 and MRI of the abdomen dated 07/30/2014  FINDINGS: There is new diffuse hepatic steatosis. Stable 2.1 cm cyst in the anterior aspect of the dome of the right lobe of the liver. Diffuse chronic biliary ductal dilatation, unchanged. The gallbladder is not distended.  Spleen, pancreas, adrenal glands, and kidneys  are normal except for tiny calcifications in the upper pole of the right kidney and in the mid left kidney. No hydronephrosis.  There is fluid throughout the large and small bowel including prominent fluid in the rectum. There are no dilated loops of bowel. The terminal ileum and appendix appear normal. Uterus is normal. Left ovary appears normal. Right ovary is obscured by the bowel.  No ascites.  No free air.  No acute osseous abnormality.  IMPRESSION: 1. Interval development of extensive hepatic steatosis. 2. Fluid throughout the large and small bowel suggesting gastroenteritis. 3. Tiny bilateral renal calculi, stable. 4. Chronic of biliary ductal dilatation unchanged.   Electronically Signed   By: Francene Boyers M.D.   On: 12/28/2014 20:50   Ct Head Wo Contrast  12/28/2014   CLINICAL DATA:  Near syncope today while using the commode. Weakness for several days.  EXAM: CT HEAD WITHOUT CONTRAST  TECHNIQUE: Contiguous axial images were obtained from the base of the skull through the vertex without intravenous contrast.  COMPARISON:  Head CT 08/01/2014  FINDINGS: No intracranial hemorrhage, mass effect, or midline shift. No hydrocephalus. The basilar cisterns are patent. No evidence of territorial infarct. No intracranial fluid collection. Mild atrophy, similar to prior exam. Atherosclerosis noted about the intracranial vasculature at the skull base. Calvarium is intact. Included paranasal sinuses and mastoid air cells are well aerated.  IMPRESSION: No acute intracranial abnormality.   Electronically Signed   By: Rubye Oaks M.D.   On: 12/28/2014 20:43   Dg Chest Port 1 View  12/28/2014   CLINICAL DATA:  54 year old female with 1 day history of syncope. Hypertension.  EXAM: PORTABLE CHEST - 1 VIEW  COMPARISON:  Chest x-ray 07/30/2014.  FINDINGS: Lung volumes are normal. No consolidative airspace disease. No pleural effusions. No pneumothorax. No pulmonary nodule or mass noted. Pulmonary vasculature and the  cardiomediastinal silhouette are within normal limits.  IMPRESSION: No radiographic evidence of acute cardiopulmonary disease.   Electronically Signed   By: Trudie Reed M.D.   On: 12/28/2014 21:35   Medications: I have reviewed the patient's current medications. Prior to Admission:  (Not in a hospital admission) Scheduled Meds: . [START ON 12/30/2014] aspirin EC  81 mg Oral Daily  . ciprofloxacin  500 mg Oral BID  . enoxaparin (LOVENOX) injection  20 mg Subcutaneous Q24H  . feeding supplement (RESOURCE BREEZE)  1 Container Oral TID BM  . folic acid  1 mg Oral Daily  . gabapentin  800 mg Oral TID  . levothyroxine  25 mcg Oral QAC breakfast  . lipase/protease/amylase  36,000 Units Oral TID WC  . metroNIDAZOLE  500 mg Oral 3 times per day  . pantoprazole  40 mg Oral Daily  . rosuvastatin  20 mg Oral Daily  . sodium chloride  3 mL  Intravenous Q12H  . sucralfate  1 g Oral BID  . sulfaSALAzine  1,000 mg Oral Daily  . thiamine  100 mg Oral Daily  . venlafaxine  25 mg Oral BID   Continuous Infusions: . 0.9 % NaCl with KCl 40 mEq / L     PRN Meds:.HYDROcodone-acetaminophen, meclizine, ondansetron, traZODone Assessment/Plan: Principal Problem:   SIRS (systemic inflammatory response syndrome) Active Problems:   DM2 (diabetes mellitus, type 2)   Anxiety state   Crohn's disease   Metabolic acidosis   Hypokalemia   Leukocytosis   Nicotine abuse   Heme positive stool   Pulmonary embolus   Dehydration   Hypotension   Hepatic steatosis  Crohn's Flare versus Gastroenteritis: Patient has only had a small episode of diarrhea since admission. Abdominal CT showed interval development of extensive hepatic steatosis, fluid throughout the large and small bowel suggesting gastroenteritis and chronic of biliary ductal dilatation unchanged. Patient has a previous diagnosis of Crohn's disease which appears questionable. She follows with Dr. Ewing Schlein with [facility]. Abnormal LFTs are consistent with  imaging findings of hepatic steatosis and chronic ductal dilatation. C. Diff negative. Acute hepatitis panel negative. CRP < 0.5. UDS positive for benzos. Ethanol level negative. UA is suspicious for infection, but patient denies any urinary symptoms.  -NS with KCl 40 mEq/Lat 125 cc/hr -Cipro/Flagyl  -GI pathogen panel pending -Fecal lactoferrin pending -Urine culture pending -Blood cultures pending -Repeat CBC/CMET tomorrow am -Regular diet -Continue Zofran 4 mg Q8H as needed for nausea/vomiting -Continue Protonix 40 mg daily -Continue sucralfate 1 g twice daily -Continue sulfasalazine 1000 mg daily -Continue Creon 36,000 units 3 times daily with meals -HIV pending -Consult to GI  Hypokalemia: 3.1>2.7 this morning. Magnesium 1.3 this morning and patient received replacement. -Repeat CMET tomorrow am -NS with KCl 40 mEq/L at 125 cc/hr   Orthostatic Syncope: Likely setting of her acute diarrheal illness. Head CT unremarkable. Orthostatic vital signs positive in setting of dehydration and diarrhea.  -Continue Antivert 25 mg TID prn  -Orthostatics as noted above -NS 125 cc/hr  AKI with AG Metabolic Acidosis: Resolving. Creatinine 1.3>1.03, baseline 0.5 0.6. Anion gap 22>13. Likely secondary to her acute diarrheal illness given bicarbonate losses. Lactic acid normal at 1.14. -Repeat BMET tomorrow am -NS with KCl 40 mEq/L at 125 cc/hr   Elevated Troponin: Troponin 0.13>0.25.0.17. Patient denies any chest pain. EKG showed no signs of ischemia. Normal sinus rhythm with PVCs. Most recent echo 03/26/2003 notable for EF 55-60% without wall motion abnormalities. No known prior cardiac history. She was given ASA 325 mg once in the ED. Cardiology believes troponins elevated in the setting in acute illness and due to supply-demand mismatch. TSH normal.  -Continue rosuvastatin 20 mg daily -TTE -ASA 81 mg daily -Monitor on telemetry -Cardiology following, appreciate recommendations  History of  Acute Bilateral PE: Acute nonobstructive bilateral PE noted on CTA 05/08/2014. Patient is on Eliquis 5 mg BID  Unclear if provoked or unprovoked given that she reports being in a similar condition prior to that admission with no family history or personal history of blood clots. -Lovenox SQ QD for DVT/PE ppx  Subjective Weight Loss: Patient eats one meal a day. Weights have fluctuated between roughly 80-110 pounds, both values recorded it appears from her same hospitalization which certainly raises doubt on the accuracy. Anorexia has been mentioned as a possible cause by past providers in addition to secondary malabsorption from pancreatic insufficiency.  -Consult to nutrition  -Continue feeding supplement -Continue vitamin B1 100 mg -  Continue folate -Regular diet -Daily weights  Chronic pain/anxiety: Per [facility] database, she last received Valium 5 mg 90 tablets on 12/13/14 from her PCP, Dilaudid 4 mg 42 tablets by Dr. Lunette Stands on 12/02/14, Vicodin 10/325 mg  60 tablets by PA Margart Sickles on 11/18/14. Other psychiatric medications that she has include trazodone 25 mg at bedtime as needed for sleep, Effexor 25 mg twice daily. -Continue Trazodone 25 mg QHS -Continue Effexor 25 mg BID -Continue Norco 10-325 mg Q6H prn -Holding Xanax and Dilaudid   Type 2 Diabetes with Neuropathy: A1c 4.9, 05/08/14. She does not appear to be on any home medications. -Continue Gabapentin 800 mg TID  Hypothyroidism: TSH normal 1.508 on 12/29/14. Patient is on Synthroid 25 mcg daily at home.  -Continue Synthroid 25 mg daily  FEN:  -Diet: Regular  DVT prophylaxis: Lovenox SQ QD  CODE STATUS: FULL CODE -Defer to her son [facility] [phone] if patients lacks decision-making capacity -Confirmed with patient on admission  Dispo: Disposition is deferred at this time, awaiting improvement of current medical problems.  Anticipated discharge in approximately 1-2 day(s).   The patient does have  a current PCP (Ibethal Georgette Dover, MD) and does not need an [facility] hospital follow-up appointment after discharge.  The patient does not have transportation limitations that hinder transportation to clinic appointments.  .Services Needed at time of discharge: Y = Yes, Blank = No PT:   OT:   RN:   Equipment:   Other:     LOS: 0 days   Jill Alexanders, DO PGY-1 Internal Medicine Resident Pager # [phone] 12/29/2014 12:45 PM

## 2014-12-29 NOTE — ED Notes (Signed)
Service response contacted and they will bring hospital bed down for pt.

## 2014-12-29 NOTE — Progress Notes (Signed)
TELEMETRY: Reviewed telemetry pt in NSR: Filed Vitals:   12/29/14 0630 12/29/14 0644 12/29/14 0700 12/29/14 0906  BP: 107/75  99/74 99/70  Pulse: 88  110 108  Temp:      TempSrc:      Resp: Height:      Weight:      SpO2: 100% 98% 100% 98%    Intake/Output Summary (Last 24 hours) at 12/29/14 1417 Last data filed at 12/29/14 1122  Gross per 24 hour  Intake   1003 ml  Output   1750 ml  Net   -747 ml   Filed Weights   12/28/14 1829  Weight: 90 lb (40.824 kg)    Subjective Denies any chest pain or SOB.  Melene Muller ON 12/30/2014] aspirin EC  81 mg Oral Daily  . ciprofloxacin  500 mg Oral BID  . enoxaparin (LOVENOX) injection  20 mg Subcutaneous Q24H  . feeding supplement (RESOURCE BREEZE)  1 Container Oral TID BM  . folic acid  1 mg Oral Daily  . gabapentin  800 mg Oral TID  . levothyroxine  25 mcg Oral QAC breakfast  . lipase/protease/amylase  36,000 Units Oral TID WC  . metroNIDAZOLE  500 mg Oral 3 times per day  . pantoprazole  40 mg Oral Daily  . rosuvastatin  20 mg Oral Daily  . sodium chloride  3 mL Intravenous Q12H  . sucralfate  1 g Oral BID  . sulfaSALAzine  1,000 mg Oral Daily  . thiamine  100 mg Oral Daily  . venlafaxine  25 mg Oral BID   . 0.9 % NaCl with KCl 40 mEq / L      LABS: Basic Metabolic Panel:  Recent Labs  16/10/96 1905 12/29/14 0225  NA 139 142  K 3.1* 2.7*  CL 102 114*  CO2 15* 15*  GLUCOSE 97 81  BUN 9 5*  CREATININE 1.25* 1.03  CALCIUM 7.4* 6.5*  MG  --  1.3*   Liver Function Tests:  Recent Labs  12/28/14 1905 12/29/14 0225  AST 44* 50*  ALT 23 21  ALKPHOS 159* 142*  BILITOT 0.9 1.2  PROT 5.7* 4.7*  ALBUMIN 2.3* 2.0*    Recent Labs  12/28/14 1905  LIPASE 18   CBC:  Recent Labs  12/28/14 1905 12/29/14 0225 12/29/14 0755  WBC 12.7* 12.4* 13.0*  NEUTROABS 9.5*  --   --   HGB 11.9* 10.6* 10.6*  HCT 38.5 34.2* 35.0*  MCV 92.1 91.4 92.8  PLT 307 267 263   Cardiac Enzymes:  Recent Labs  12/29/14 0220 12/29/14 0755 12/29/14 1253  TROPONINI 0.25* 0.17* 0.15*   BNP: No results for input(s): PROBNP in the last 72 hours. D-Dimer: No results for input(s): DDIMER in the last 72 hours. Hemoglobin A1C: No results for input(s): HGBA1C in the last 72 hours. Fasting Lipid Panel: No results for input(s): CHOL, HDL, LDLCALC, TRIG, CHOLHDL, LDLDIRECT in the last 72 hours. Thyroid Function Tests:  Recent Labs  12/29/14 0220  TSH 1.508     Radiology/Studies:  Ct Abdomen Pelvis Wo Contrast  12/28/2014   CLINICAL DATA:  Diarrhea.  Near syncope.  Weakness.  EXAM: CT ABDOMEN AND PELVIS WITHOUT CONTRAST  TECHNIQUE: Multidetector CT imaging of the abdomen and pelvis was performed following the standard protocol without IV contrast.  COMPARISON:  CT scan dated 05/07/2014 and MRI of the abdomen dated 07/30/2014  FINDINGS: There is new diffuse hepatic steatosis. Stable 2.1 cm cyst  in the anterior aspect of the dome of the right lobe of the liver. Diffuse chronic biliary ductal dilatation, unchanged. The gallbladder is not distended.  Spleen, pancreas, adrenal glands, and kidneys are normal except for tiny calcifications in the upper pole of the right kidney and in the mid left kidney. No hydronephrosis.  There is fluid throughout the large and small bowel including prominent fluid in the rectum. There are no dilated loops of bowel. The terminal ileum and appendix appear normal. Uterus is normal. Left ovary appears normal. Right ovary is obscured by the bowel.  No ascites.  No free air.  No acute osseous abnormality.  IMPRESSION: 1. Interval development of extensive hepatic steatosis. 2. Fluid throughout the large and small bowel suggesting gastroenteritis. 3. Tiny bilateral renal calculi, stable. 4. Chronic of biliary ductal dilatation unchanged.   Electronically Signed   By: Francene Boyers M.D.   On: 12/28/2014 20:50   Ct Head Wo Contrast  12/28/2014   CLINICAL DATA:  Near syncope today while  using the commode. Weakness for several days.  EXAM: CT HEAD WITHOUT CONTRAST  TECHNIQUE: Contiguous axial images were obtained from the base of the skull through the vertex without intravenous contrast.  COMPARISON:  Head CT 08/01/2014  FINDINGS: No intracranial hemorrhage, mass effect, or midline shift. No hydrocephalus. The basilar cisterns are patent. No evidence of territorial infarct. No intracranial fluid collection. Mild atrophy, similar to prior exam. Atherosclerosis noted about the intracranial vasculature at the skull base. Calvarium is intact. Included paranasal sinuses and mastoid air cells are well aerated.  IMPRESSION: No acute intracranial abnormality.   Electronically Signed   By: Rubye Oaks M.D.   On: 12/28/2014 20:43   Dg Chest Port 1 View  12/28/2014   CLINICAL DATA:  54 year old female with 1 day history of syncope. Hypertension.  EXAM: PORTABLE CHEST - 1 VIEW  COMPARISON:  Chest x-ray 07/30/2014.  FINDINGS: Lung volumes are normal. No consolidative airspace disease. No pleural effusions. No pneumothorax. No pulmonary nodule or mass noted. Pulmonary vasculature and the cardiomediastinal silhouette are within normal limits.  IMPRESSION: No radiographic evidence of acute cardiopulmonary disease.   Electronically Signed   By: Trudie Reed M.D.   On: 12/28/2014 21:35   Ecg NSR with occ. PVC. No acute change.  PHYSICAL EXAM General: Very thin BF , in no acute distress. Head: Normocephalic, atraumatic, sclera non-icteric, oropharynx is clear Neck: Negative for carotid bruits. JVD not elevated. No adenopathy Lungs: Clear bilaterally to auscultation without wheezes, rales, or rhonchi. Breathing is unlabored. Heart: RRR S1 S2 without murmurs, rubs, or gallops.  Abdomen: Soft, non-tender, non-distended with normoactive bowel sounds. No hepatomegaly. No rebound/guarding. No obvious abdominal masses. Msk:  Strength and tone appears normal for age. Extremities: No clubbing, cyanosis  or edema.  Distal pedal pulses are 2+ and equal bilaterally. Neuro: Alert and oriented X 3. Moves all extremities spontaneously. Psych:  Responds to questions appropriately with a normal affect.  ASSESSMENT AND PLAN: 1. Troponin elevation with flat curve. No acute Ecg changes and no chest pain. I suspect this is demand ischemia from her other illnesses.  Will follow up Echo. Continue ASA and statin therapy. If EF is normal would defer any ischemic work up at this time. 2. History of PE. 3. Acute diarrhea 4. HTN 5. DM type 2.   Present on Admission:  . Anxiety state . Crohn's disease . SIRS (systemic inflammatory response syndrome) . Leukocytosis . Hypokalemia . Heme positive stool . Hypotension .  Dehydration . Pulmonary embolus . Nicotine abuse . Hepatic steatosis . Metabolic acidosis . Diarrhea  Signed, Vana Arif [place], MDFACC 12/29/2014 2:17 PM

## 2014-12-29 NOTE — Consult Note (Signed)
[facility] Consult Note  Referring Provider: No ref. provider found Primary Care Physician:  Kelton Pillar, Lucienne Capers, MD Primary Gastroenterologist:  Dr.  Laurel Dimmer Complaint: Diarrhea HPI: [name] is an 54 y.o. black female  with recurrent chronic diarrhea of unknown etiology presented with worsening diarrhea near syncope and dizziness with signs of dehydration and electrolyte abnormalities. She states her stools are nonbloody and not associated with significant pain or bleeding and occasional nausea but no vomiting.  Per chart review, she had a colonoscopy performed by Dr. Earle Gell [GI] on 11/12/2002 with findings suggestive of Crohn's disease given universal proctocolitis [friable rectocolonic mucosa associated with deep ulcers adjacent normal appearing mucosa] though biopsy findings were notable for Crohn's disease versus infectious colitis. Most recently, she was hospitalized in August 2015 and late October 2015 for SIRS and thought to have dehydration secondary to diarrhea. Workup for infectious etiologies has been thus far negative. EGD 05/21/2014 with questionable mild gastritis versus gastropathy but no signs of active bleeding or lesions. MR/MRCP 07/30/2014 notable for diffuse biliary ductal dilatation and mild pancreatic ductal dilatation. Per Dr. Paulita Fujita [GI] on her most recent hospitalization in October 2015, diagnosis of Crohn's disease was questionable given no findings on most recent abnormal CT as well as MRI/MRCP, and it was suspected that this may be malabsorption secondary to pancreatic insufficiency given prior history of chronic pancreatitis from alcohol abuse. She was discharged on trial of pancreatic enzymes and low-fat diet with follow-up as outpatient reports following up with Dr. Watt Climes about a month ago at which time she was planned to have a repeat colonoscopy.    She was found to be hypokalemic with a potassium of 2.7 and also had a slightly elevated troponin  and cardiology was consulted for this. She was Hemoccult positive in emergency room with a hemoglobin of 10.6 . When she was last in Dr. Perley Jain office she was on sulfasalazine as well as pancreatic enzymes although again it is unclear how stronger diagnosis of inflammatory bowel diseases. At the time of her EGD I'm not sure whether she had duodenal biopsies to rule out celiac disease.  Past Medical History  Diagnosis Date  . Glaucoma   . Diabetes mellitus without complication   . Hypertension   . Neuropathy   . Crohn disease   . Asthma   . Pulmonary embolus 05/24/2014    Past Surgical History  Procedure Laterality Date  . Cesarean section    . Esophagogastroduodenoscopy N/A 05/21/2014    Procedure: ESOPHAGOGASTRODUODENOSCOPY (EGD);  Surgeon: Jeryl Columbia, MD;  Location: [facility] ENDOSCOPY;  Service: Endoscopy;  Laterality: N/A;     (Not in a hospital admission)  Allergies:  Allergies  Allergen Reactions  . Iohexol Other (See Comments)    "severe burning" Patient has received Contrast in 2005 with 13 hour pre-medication, and had no reaction at that time  . Spiriva Handihaler [Tiotropium Bromide Monohydrate] Nausea And Vomiting  . Ativan [Lorazepam] Other (See Comments)    hallucinations  . Penicillins Hives    Family History  Problem Relation Age of Onset  . Breast cancer Mother   . Heart disease Mother 39    CABG  . Diabetes Mother   . Heart disease Father 28    CABG  . Diabetes Father     Social History:  reports that she has been smoking Cigarettes.  She has a 6.5 pack-year smoking history. She has never used smokeless tobacco. She reports that she does not drink alcohol or use  illicit drugs.  Review of Systems: negative except some possible vertigo  Blood pressure 99/70, pulse 108, temperature 99.6 F (37.6 C), temperature source Rectal, resp. rate 20, height _0  (1.6 m), weight 40.824 kg (90 lb), last menstrual period 10/01/2010, SpO2 98 %. Head: Normocephalic,  without obvious abnormality, atraumatic Neck: no adenopathy, no carotid bruit, no JVD, supple, symmetrical, trachea midline and thyroid not enlarged, symmetric, no tenderness/mass/nodules Resp: clear to auscultation bilaterally Cardio: regular rate and rhythm,  tachycardic S1, S2 normal, no murmur, click, rub or gallop GI: Mild diffuse tenderness in the epigastrium. There is a small umbilical scar above the umbilicus  Extremities: extremities normal, atraumatic, no cyanosis or edema  Results for orders placed or performed during the hospital encounter of 12/28/14 (from the past 48 hour(s))  CBG monitoring, ED     Status: Abnormal   Collection Time: 12/28/14  6:50 PM  Result Value Ref Range   Glucose-Capillary 119 (H) 70 - 99 mg/dL  CBC with Differential/Platelet     Status: Abnormal   Collection Time: 12/28/14  7:05 PM  Result Value Ref Range   WBC 12.7 (H) 4.0 - 10.5 K/uL   RBC 4.18 3.87 - 5.11 MIL/uL   Hemoglobin 11.9 (L) 12.0 - 15.0 g/dL   HCT 38.5 36.0 - 46.0 %   MCV 92.1 78.0 - 100.0 fL   MCH 28.5 26.0 - 34.0 pg   MCHC 30.9 30.0 - 36.0 g/dL   RDW 16.5 (H) 11.5 - 15.5 %   Platelets 307 150 - 400 K/uL   Neutrophils Relative % 75 43 - 77 %   Neutro Abs 9.5 (H) 1.7 - 7.7 K/uL   Lymphocytes Relative 21 12 - 46 %   Lymphs Abs 2.6 0.7 - 4.0 K/uL   Monocytes Relative 4 3 - 12 %   Monocytes Absolute 0.6 0.1 - 1.0 K/uL   Eosinophils Relative 0 0 - 5 %   Eosinophils Absolute 0.0 0.0 - 0.7 K/uL   Basophils Relative 0 0 - 1 %   Basophils Absolute 0.0 0.0 - 0.1 K/uL  Comprehensive metabolic panel     Status: Abnormal   Collection Time: 12/28/14  7:05 PM  Result Value Ref Range   Sodium 139 135 - 145 mmol/L   Potassium 3.1 (L) 3.5 - 5.1 mmol/L   Chloride 102 96 - 112 mmol/L   CO2 15 (L) 19 - 32 mmol/L   Glucose, Bld 97 70 - 99 mg/dL   BUN 9 6 - 23 mg/dL   Creatinine, Ser 1.25 (H) 0.50 - 1.10 mg/dL   Calcium 7.4 (L) 8.4 - 10.5 mg/dL   Total Protein 5.7 (L) 6.0 - 8.3 g/dL   Albumin  2.3 (L) 3.5 - 5.2 g/dL   AST 44 (H) 0 - 37 U/L   ALT 23 0 - 35 U/L   Alkaline Phosphatase 159 (H) 39 - 117 U/L   Total Bilirubin 0.9 0.3 - 1.2 mg/dL   GFR calc non Af Amer 47 (L) >90 mL/min   GFR calc Af Amer 55 (L) >90 mL/min    Comment: (NOTE) The eGFR has been calculated using the CKD EPI equation. This calculation has not been validated in all clinical situations. eGFR's persistently <90 mL/min signify possible Chronic Kidney Disease.    Anion gap 22 (H) 5 - 15  Troponin I     Status: Abnormal   Collection Time: 12/28/14  7:05 PM  Result Value Ref Range   Troponin I 0.13 (H) <0.031  ng/mL    Comment:        PERSISTENTLY INCREASED TROPONIN VALUES IN THE RANGE OF 0.04-0.49 ng/mL CAN BE SEEN IN:       -UNSTABLE ANGINA       -CONGESTIVE HEART FAILURE       -MYOCARDITIS       -CHEST TRAUMA       -ARRYHTHMIAS       -LATE PRESENTING MYOCARDIAL INFARCTION       -COPD   CLINICAL FOLLOW-UP RECOMMENDED.   Brain natriuretic peptide     Status: None   Collection Time: 12/28/14  7:05 PM  Result Value Ref Range   B Natriuretic Peptide 77.5 0.0 - 100.0 pg/mL  Protime-INR     Status: None   Collection Time: 12/28/14  7:05 PM  Result Value Ref Range   Prothrombin Time 13.6 11.6 - 15.2 seconds   INR 1.03 0.00 - 1.49  Lipase, blood     Status: None   Collection Time: 12/28/14  7:05 PM  Result Value Ref Range   Lipase 18 11 - 59 U/L  POC occult blood, ED     Status: Abnormal   Collection Time: 12/28/14  8:33 PM  Result Value Ref Range   Fecal Occult Bld POSITIVE (A) NEGATIVE  I-Stat CG4 Lactic Acid, ED     Status: None   Collection Time: 12/29/14 12:18 AM  Result Value Ref Range   Lactic Acid, Venous 1.14 0.5 - 2.0 mmol/L  TSH     Status: None   Collection Time: 12/29/14  2:20 AM  Result Value Ref Range   TSH 1.508 0.350 - 4.500 uIU/mL  Troponin I     Status: Abnormal   Collection Time: 12/29/14  2:20 AM  Result Value Ref Range   Troponin I 0.25 (H) <0.031 ng/mL    Comment:         PERSISTENTLY INCREASED TROPONIN VALUES IN THE RANGE OF 0.04-0.49 ng/mL CAN BE SEEN IN:       -UNSTABLE ANGINA       -CONGESTIVE HEART FAILURE       -MYOCARDITIS       -CHEST TRAUMA       -ARRYHTHMIAS       -LATE PRESENTING MYOCARDIAL INFARCTION       -COPD   CLINICAL FOLLOW-UP RECOMMENDED.   Ethanol     Status: None   Collection Time: 12/29/14  2:20 AM  Result Value Ref Range   Alcohol, Ethyl (B) <5 0 - 9 mg/dL    Comment:        LOWEST DETECTABLE LIMIT FOR SERUM ALCOHOL IS 11 mg/dL FOR MEDICAL PURPOSES ONLY   CBC     Status: Abnormal   Collection Time: 12/29/14  2:25 AM  Result Value Ref Range   WBC 12.4 (H) 4.0 - 10.5 K/uL   RBC 3.74 (L) 3.87 - 5.11 MIL/uL   Hemoglobin 10.6 (L) 12.0 - 15.0 g/dL   HCT 34.2 (L) 36.0 - 46.0 %   MCV 91.4 78.0 - 100.0 fL   MCH 28.3 26.0 - 34.0 pg   MCHC 31.0 30.0 - 36.0 g/dL   RDW 16.5 (H) 11.5 - 15.5 %   Platelets 267 150 - 400 K/uL  Hepatitis panel, acute     Status: None   Collection Time: 12/29/14  2:25 AM  Result Value Ref Range   Hepatitis B Surface Ag NEGATIVE NEGATIVE   HCV Ab NEGATIVE NEGATIVE   Hep A IgM NON REACTIVE NON  REACTIVE    Comment: (NOTE) Effective August 16, 2014, Hepatitis Acute Panel (test code [id number]) will be revised to automatically reflex to the Hepatitis C Viral RNA, Quantitative, Real-Time PCR assay if the Hepatitis C antibody screening result is Reactive. This action is being taken to ensure that the CDC/USPSTF recommended HCV diagnostic algorithm with the appropriate test reflex needed for accurate interpretation is followed.    Hep B C IgM NON REACTIVE NON REACTIVE    Comment: (NOTE) High levels of Hepatitis B Core IgM antibody are detectable during the acute stage of Hepatitis B. This antibody is used to differentiate current from past HBV infection. Performed at [facility]   Comprehensive metabolic panel     Status: Abnormal   Collection Time: 12/29/14  2:25 AM  Result Value Ref  Range   Sodium 142 135 - 145 mmol/L   Potassium 2.7 (LL) 3.5 - 5.1 mmol/L    Comment: REPEATED TO VERIFY CRITICAL RESULT CALLED TO, READ BACK BY AND VERIFIED WITH: J.FERRAINOO RN [id number] 12/29/14 E.GADDY    Chloride 114 (H) 96 - 112 mmol/L   CO2 15 (L) 19 - 32 mmol/L   Glucose, Bld 81 70 - 99 mg/dL   BUN 5 (L) 6 - 23 mg/dL   Creatinine, Ser 1.03 0.50 - 1.10 mg/dL   Calcium 6.5 (L) 8.4 - 10.5 mg/dL   Total Protein 4.7 (L) 6.0 - 8.3 g/dL   Albumin 2.0 (L) 3.5 - 5.2 g/dL   AST 50 (H) 0 - 37 U/L   ALT 21 0 - 35 U/L   Alkaline Phosphatase 142 (H) 39 - 117 U/L   Total Bilirubin 1.2 0.3 - 1.2 mg/dL   GFR calc non Af Amer 60 (L) >90 mL/min   GFR calc Af Amer 69 (L) >90 mL/min    Comment: (NOTE) The eGFR has been calculated using the CKD EPI equation. This calculation has not been validated in all clinical situations. eGFR's persistently <90 mL/min signify possible Chronic Kidney Disease.    Anion gap 13 5 - 15  Magnesium     Status: Abnormal   Collection Time: 12/29/14  2:25 AM  Result Value Ref Range   Magnesium 1.3 (L) 1.5 - 2.5 mg/dL  C-reactive protein     Status: Abnormal   Collection Time: 12/29/14  2:25 AM  Result Value Ref Range   CRP <0.5 (L) <0.60 mg/dL    Comment: Performed at [facility]  Urinalysis, Routine w reflex microscopic     Status: Abnormal   Collection Time: 12/29/14  2:54 AM  Result Value Ref Range   Color, Urine YELLOW YELLOW   APPearance CLEAR CLEAR   Specific Gravity, Urine 1.010 1.005 - 1.030   pH 6.0 5.0 - 8.0   Glucose, UA NEGATIVE NEGATIVE mg/dL   Hgb urine dipstick MODERATE (A) NEGATIVE   Bilirubin Urine NEGATIVE NEGATIVE   Ketones, ur 15 (A) NEGATIVE mg/dL   Protein, ur NEGATIVE NEGATIVE mg/dL   Urobilinogen, UA 0.2 0.0 - 1.0 mg/dL   Nitrite POSITIVE (A) NEGATIVE   Leukocytes, UA MODERATE (A) NEGATIVE  Urine rapid drug screen (hosp performed)     Status: Abnormal   Collection Time: 12/29/14  2:54 AM  Result Value Ref Range   Opiates  NONE DETECTED NONE DETECTED   Cocaine NONE DETECTED NONE DETECTED   Benzodiazepines POSITIVE (A) NONE DETECTED   Amphetamines NONE DETECTED NONE DETECTED   Tetrahydrocannabinol NONE DETECTED NONE DETECTED   Barbiturates NONE DETECTED NONE DETECTED  Comment:        DRUG SCREEN FOR MEDICAL PURPOSES ONLY.  IF CONFIRMATION IS NEEDED FOR ANY PURPOSE, NOTIFY LAB WITHIN 5 DAYS.        LOWEST DETECTABLE LIMITS FOR URINE DRUG SCREEN Drug Class       Cutoff (ng/mL) Amphetamine      1000 Barbiturate      200 Benzodiazepine   119 Tricyclics       147 Opiates          300 Cocaine          300 THC              50   Urine microscopic-add on     Status: Abnormal   Collection Time: 12/29/14  2:54 AM  Result Value Ref Range   Squamous Epithelial / LPF RARE RARE   WBC, UA 7-10 <3 WBC/hpf   Bacteria, UA MANY (A) RARE  Clostridium Difficile by PCR     Status: None   Collection Time: 12/29/14  4:13 AM  Result Value Ref Range   C difficile by pcr NEGATIVE NEGATIVE  CBC     Status: Abnormal   Collection Time: 12/29/14  7:55 AM  Result Value Ref Range   WBC 13.0 (H) 4.0 - 10.5 K/uL   RBC 3.77 (L) 3.87 - 5.11 MIL/uL   Hemoglobin 10.6 (L) 12.0 - 15.0 g/dL   HCT 35.0 (L) 36.0 - 46.0 %   MCV 92.8 78.0 - 100.0 fL   MCH 28.1 26.0 - 34.0 pg   MCHC 30.3 30.0 - 36.0 g/dL   RDW 16.6 (H) 11.5 - 15.5 %   Platelets 263 150 - 400 K/uL  Troponin I     Status: Abnormal   Collection Time: 12/29/14  7:55 AM  Result Value Ref Range   Troponin I 0.17 (H) <0.031 ng/mL    Comment:        PERSISTENTLY INCREASED TROPONIN VALUES IN THE RANGE OF 0.04-0.49 ng/mL CAN BE SEEN IN:       -UNSTABLE ANGINA       -CONGESTIVE HEART FAILURE       -MYOCARDITIS       -CHEST TRAUMA       -ARRYHTHMIAS       -LATE PRESENTING MYOCARDIAL INFARCTION       -COPD   CLINICAL FOLLOW-UP RECOMMENDED.    Ct Abdomen Pelvis Wo Contrast  12/28/2014   CLINICAL DATA:  Diarrhea.  Near syncope.  Weakness.  EXAM: CT ABDOMEN AND  PELVIS WITHOUT CONTRAST  TECHNIQUE: Multidetector CT imaging of the abdomen and pelvis was performed following the standard protocol without IV contrast.  COMPARISON:  CT scan dated 05/07/2014 and MRI of the abdomen dated 07/30/2014  FINDINGS: There is new diffuse hepatic steatosis. Stable 2.1 cm cyst in the anterior aspect of the dome of the right lobe of the liver. Diffuse chronic biliary ductal dilatation, unchanged. The gallbladder is not distended.  Spleen, pancreas, adrenal glands, and kidneys are normal except for tiny calcifications in the upper pole of the right kidney and in the mid left kidney. No hydronephrosis.  There is fluid throughout the large and small bowel including prominent fluid in the rectum. There are no dilated loops of bowel. The terminal ileum and appendix appear normal. Uterus is normal. Left ovary appears normal. Right ovary is obscured by the bowel.  No ascites.  No free air.  No acute osseous abnormality.  IMPRESSION: 1. Interval development of extensive hepatic steatosis.  2. Fluid throughout the large and small bowel suggesting gastroenteritis. 3. Tiny bilateral renal calculi, stable. 4. Chronic of biliary ductal dilatation unchanged.   Electronically Signed   By: Lorriane Shire M.D.   On: 12/28/2014 20:50   Ct Head Wo Contrast  12/28/2014   CLINICAL DATA:  Near syncope today while using the commode. Weakness for several days.  EXAM: CT HEAD WITHOUT CONTRAST  TECHNIQUE: Contiguous axial images were obtained from the base of the skull through the vertex without intravenous contrast.  COMPARISON:  Head CT 08/01/2014  FINDINGS: No intracranial hemorrhage, mass effect, or midline shift. No hydrocephalus. The basilar cisterns are patent. No evidence of territorial infarct. No intracranial fluid collection. Mild atrophy, similar to prior exam. Atherosclerosis noted about the intracranial vasculature at the skull base. Calvarium is intact. Included paranasal sinuses and mastoid air cells  are well aerated.  IMPRESSION: No acute intracranial abnormality.   Electronically Signed   By: Jeb Levering M.D.   On: 12/28/2014 20:43   Dg Chest Port 1 View  12/28/2014   CLINICAL DATA:  54 year old female with 1 day history of syncope. Hypertension.  EXAM: PORTABLE CHEST - 1 VIEW  COMPARISON:  Chest x-ray 07/30/2014.  FINDINGS: Lung volumes are normal. No consolidative airspace disease. No pleural effusions. No pneumothorax. No pulmonary nodule or mass noted. Pulmonary vasculature and the cardiomediastinal silhouette are within normal limits.  IMPRESSION: No radiographic evidence of acute cardiopulmonary disease.   Electronically Signed   By: Vinnie Langton M.D.   On: 12/28/2014 21:35    Assessment: Acute on chronic diarrhea in a patient who has tentatively been diagnosed with inflammatory bowel disease also has a very likely component of pancreatic insufficiency admitted with worsening diarrhea and dehydration  Plan:  Will plan colonoscopy when medical condition optimized. Cardiology workup for elevated troponin and progress. Patient receiving potassium. We'll check C. difficile and stool for enteric pathogens as well as a tissue transglutaminase to rule out celiac disease. We'll follow with you  Terren Jandreau C 12/29/2014, 12:49 PM

## 2014-12-30 DIAGNOSIS — I509 Heart failure, unspecified: Secondary | ICD-10-CM

## 2014-12-30 DIAGNOSIS — N179 Acute kidney failure, unspecified: Secondary | ICD-10-CM | POA: Diagnosis present

## 2014-12-30 DIAGNOSIS — R7989 Other specified abnormal findings of blood chemistry: Secondary | ICD-10-CM

## 2014-12-30 DIAGNOSIS — E46 Unspecified protein-calorie malnutrition: Secondary | ICD-10-CM

## 2014-12-30 DIAGNOSIS — E43 Unspecified severe protein-calorie malnutrition: Secondary | ICD-10-CM | POA: Insufficient documentation

## 2014-12-30 DIAGNOSIS — N390 Urinary tract infection, site not specified: Secondary | ICD-10-CM

## 2014-12-30 LAB — COMPREHENSIVE METABOLIC PANEL
ALT: 32 U/L (ref 0–35)
ANION GAP: 7 (ref 5–15)
AST: 113 U/L — AB (ref 0–37)
Albumin: 1.8 g/dL — ABNORMAL LOW (ref 3.5–5.2)
Alkaline Phosphatase: 119 U/L — ABNORMAL HIGH (ref 39–117)
CALCIUM: 7.1 mg/dL — AB (ref 8.4–10.5)
CO2: 16 mmol/L — ABNORMAL LOW (ref 19–32)
Chloride: 124 mmol/L — ABNORMAL HIGH (ref 96–112)
Creatinine, Ser: 0.64 mg/dL (ref 0.50–1.10)
GFR calc non Af Amer: >90 mL/min (ref >90)
GLUCOSE: 131 mg/dL — AB (ref 70–99)
Potassium: 3.9 mmol/L (ref 3.5–5.1)
Sodium: 147 mmol/L — ABNORMAL HIGH (ref 135–145)
Total Bilirubin: 0.4 mg/dL (ref 0.3–1.2)
Total Protein: 4.4 g/dL — ABNORMAL LOW (ref 6.0–8.3)

## 2014-12-30 LAB — CBC
HEMATOCRIT: 29.9 % — AB (ref 36.0–46.0)
Hemoglobin: 9.2 g/dL — ABNORMAL LOW (ref 12.0–15.0)
MCH: 28.1 pg (ref 26.0–34.0)
MCHC: 30.8 g/dL (ref 30.0–36.0)
MCV: 91.4 fL (ref 78.0–100.0)
PLATELETS: 234 10*3/uL (ref 150–400)
RBC: 3.27 MIL/uL — AB (ref 3.87–5.11)
RDW: 16.4 % — ABNORMAL HIGH (ref 11.5–15.5)
WBC: 9.7 10*3/uL (ref 4.0–10.5)

## 2014-12-30 LAB — FECAL LACTOFERRIN, QUANT: Fecal Lactoferrin: NEGATIVE

## 2014-12-30 MED ORDER — HYDROMORPHONE HCL 2 MG PO TABS
1.0000 mg | ORAL_TABLET | Freq: Four times a day (QID) | ORAL | Status: DC | PRN
  Administered 2014-12-30 – 2014-12-31 (×4): 1 mg via ORAL
  Filled 2014-12-30 (×5): qty 1

## 2014-12-30 MED ORDER — CLONAZEPAM 1 MG PO TABS
1.0000 mg | ORAL_TABLET | Freq: Two times a day (BID) | ORAL | Status: DC | PRN
Start: 2014-12-30 — End: 2014-12-31
  Administered 2014-12-30: 1 mg via ORAL
  Filled 2014-12-30: qty 1

## 2014-12-30 MED ORDER — LORAZEPAM 1 MG PO TABS: 1.0000 mg | ORAL_TABLET | Freq: Two times a day (BID) | ORAL | Status: DC | PRN

## 2014-12-30 MED ORDER — SODIUM CHLORIDE 0.9 % IV SOLN
INTRAVENOUS | Status: DC
  Administered 2014-12-30: 16:00:00 via INTRAVENOUS

## 2014-12-30 MED ORDER — ALPRAZOLAM 0.5 MG PO TABS: 1.0000 mg | ORAL_TABLET | Freq: Two times a day (BID) | ORAL | Status: DC | PRN

## 2014-12-30 NOTE — Progress Notes (Signed)
    Subjective:  No chest pain or shortness of breath. She complains of back and neck pain.  Objective:  Vital Signs in the last 24 hours: Temp:  [97.8 F (36.6 C)-98.2 F (36.8 C)] 97.8 F (36.6 C) (03/31 0453) Pulse Rate:  [85-88] 85 (03/31 0453) Resp:  [14-22] 18 (03/31 0453) BP: (91-118)/(59-89) 118/89 mmHg (03/31 0453) SpO2:  [93 %-100 %] 93 % (03/31 0453) Weight:  [94 lb 6.4 oz (42.82 kg)] 94 lb 6.4 oz (42.82 kg) (03/31 0453)  Intake/Output from previous day: 03/30 0701 - 03/31 0700 In: 3 [I.V.:3] Out: -   Physical Exam: Pt is alert and oriented, NAD HEENT: normal Neck: JVP - normal Lungs: CTA bilaterally CV: RRR without murmur or gallop Abd: soft, NT Ext: no edema Skin: warm/dry no rash   Lab Results:  Recent Labs  12/29/14 0225 12/29/14 0755  WBC 12.4* 13.0*  HGB 10.6* 10.6*  PLT 267 263    Recent Labs  12/28/14 1905 12/29/14 0225  NA 139 142  K 3.1* 2.7*  CL 102 114*  CO2 15* 15*  GLUCOSE 97 81  BUN 9 5*  CREATININE 1.25* 1.03    Recent Labs  12/29/14 0755 12/29/14 1253  TROPONINI 0.17* 0.15*    Cardiac Studies: 2-D echocardiogram pending  Assessment/Plan:  Minimal troponin elevation with flat curve. 2-D echo pending. Symptoms are not consistent with acute coronary syndrome. I do not think she will require any further cardiac evaluation, but will await echo result.   Tonny Bollman, M.D. 12/30/2014, 10:25 AM

## 2014-12-30 NOTE — Discharge Summary (Signed)
Name: [name] MRN: [id number] DOB: May 04, 1961 54 y.o. PCP: Clelia Schaumann, MD  Date of Admission: 12/28/2014  6:13 PM Date of Discharge: 01/03/2015 Attending Physician: Earl Lagos, MD  Discharge Diagnosis:  Principal Problem:   Chronic diarrhea of unknown origin Active Problems:   DM2 (diabetes mellitus, type 2)   Anxiety state   Crohn's disease   Metabolic acidosis   Hypokalemia   Leukocytosis   Nicotine abuse   Heme positive stool   Pulmonary embolus   Dehydration   Hypotension   Hepatic steatosis   Elevated troponin   Gastroenteritis   Acute kidney injury   Malnutrition of moderate degree  Discharge Medications:   Medication List    STOP taking these medications        apixaban 5 MG Tabs tablet  Commonly known as:  ELIQUIS     free water Soln     HYDROcodone-acetaminophen 10-325 MG per tablet  Commonly known as:  NORCO     lipase/protease/amylase 40981 UNITS Cpep capsule  Commonly known as:  CREON      TAKE these medications        alprazolam 2 MG tablet  Commonly known as:  XANAX  Take 0.5 tablets (1 mg total) by mouth 3 (three) times daily as needed for sleep or anxiety.     aspirin 81 MG EC tablet  Take 1 tablet (81 mg total) by mouth daily.     feeding supplement (RESOURCE BREEZE) Liqd  Take 1 Container by mouth 3 (three) times daily between meals.     folic acid 1 MG tablet  Commonly known as:  FOLVITE  Take 1 tablet (1 mg total) by mouth daily.     gabapentin 800 MG tablet  Commonly known as:  NEURONTIN  Take 1 tablet (800 mg total) by mouth 3 (three) times daily.     HYDROmorphone 4 MG tablet  Commonly known as:  DILAUDID  Take 4 mg by mouth as needed for severe pain.     levothyroxine 25 MCG tablet  Commonly known as:  SYNTHROID, LEVOTHROID  Take 1 tablet (25 mcg total) by mouth daily before breakfast.     meclizine 25 MG tablet  Commonly known as:  ANTIVERT  Take 25 mg by mouth 3 (three) times daily as needed  for dizziness.     naproxen sodium 220 MG tablet  Commonly known as:  ANAPROX  Take 220 mg by mouth as needed (for pain).     ondansetron 4 MG tablet  Commonly known as:  ZOFRAN  Take 4 mg by mouth every 8 (eight) hours as needed for nausea or vomiting.     pantoprazole 20 MG tablet  Commonly known as:  PROTONIX  Take 2 tablets (40 mg total) by mouth daily.     potassium chloride SA 20 MEQ tablet  Commonly known as:  K-DUR,KLOR-CON  Take 2 tablets (40 mEq total) by mouth daily.  Start taking on:  01/04/2015     rosuvastatin 20 MG tablet  Commonly known as:  CRESTOR  Take 1 tablet (20 mg total) by mouth daily.     sucralfate 1 G tablet  Commonly known as:  CARAFATE  Take 1 tablet (1 g total) by mouth 2 (two) times daily.     sulfaSALAzine 500 MG tablet  Commonly known as:  AZULFIDINE  Take 2 tablets (1,000 mg total) by mouth daily.     thiamine 100 MG tablet  Commonly known as:  VITAMIN  B-1  Take 1 tablet (100 mg total) by mouth daily.     traZODone 25 mg Tabs tablet  Commonly known as:  DESYREL  Take 0.5 tablets (25 mg total) by mouth at bedtime as needed for sleep.     venlafaxine 25 MG tablet  Commonly known as:  EFFEXOR  Take 1 tablet (25 mg total) by mouth 2 (two) times daily.       Disposition and follow-up:   Ms.Gene S Vardaman was discharged from [facility] in Good condition.  At the hospital follow up visit please address:  1.  Follow up with GI for colonoscopy, need for potassium supplementation, consider referral to pain clinic for chronic pain and psychiatry for mental health issues.  2.  Labs / imaging needed at time of follow-up: BMP, Mg.  3.  Pending labs/ test needing follow-up: Blood culture, no growth to date.  Follow-up Appointments: Follow-up Information    Follow up with [facility] E, MD On 01/19/2015.   Specialty:  Gastroenterology   Why:  10:30 am for colonoscopy.   Contact information:   [address] [place] [address] [phone]       Follow up with Shamleffer, Madelin Rear, MD On 01/07/2015.   Specialty:  Internal Medicine   Why:  10:30 am   Contact information:   [address]  [address] [place] [address] [phone]       Discharge Instructions:  Thank you for allowing [place] to be involved in your healthcare while you were hospitalized at [facility].   Please note that there have been changes to your home medications.  --> PLEASE LOOK AT YOUR DISCHARGE MEDICATION LIST FOR DETAILS.   Please call your PCP if you have any questions or concerns, or any difficulty getting any of your medications.  Please return to the ER if you have worsening of your symptoms or new severe symptoms arise.  Follow up with Dr. Ewing Schlein to get a colonoscopy.  Follow up with you PCP regarding continuing to receive your pain medications.  You potassium has been low during the hospitalization, so we would like you to take Kdur 40 mEq daily to maintain a normal level. Discharge Instructions    Call MD for:  difficulty breathing, headache or visual disturbances    Complete by:  As directed      Call MD for:  extreme fatigue    Complete by:  As directed      Call MD for:  persistant dizziness or light-headedness    Complete by:  As directed      Call MD for:  persistant nausea and vomiting    Complete by:  As directed      Call MD for:  redness, tenderness, or signs of infection (pain, swelling, redness, odor or green/yellow discharge around incision site)    Complete by:  As directed      Call MD for:  severe uncontrolled pain    Complete by:  As directed      Call MD for:  temperature >100.4    Complete by:  As directed      Diet - low sodium heart healthy    Complete by:  As directed      Increase activity slowly    Complete by:  As directed            Consultations: Treatment Team:  Dorena Cookey, MD  Procedures Performed:  Ct Abdomen  Pelvis Wo  Contrast  12/28/2014   CLINICAL DATA:  Diarrhea.  Near syncope.  Weakness.  EXAM: CT ABDOMEN AND PELVIS WITHOUT CONTRAST  TECHNIQUE: Multidetector CT imaging of the abdomen and pelvis was performed following the standard protocol without IV contrast.  COMPARISON:  CT scan dated 05/07/2014 and MRI of the abdomen dated 07/30/2014  FINDINGS: There is new diffuse hepatic steatosis. Stable 2.1 cm cyst in the anterior aspect of the dome of the right lobe of the liver. Diffuse chronic biliary ductal dilatation, unchanged. The gallbladder is not distended.  Spleen, pancreas, adrenal glands, and kidneys are normal except for tiny calcifications in the upper pole of the right kidney and in the mid left kidney. No hydronephrosis.  There is fluid throughout the large and small bowel including prominent fluid in the rectum. There are no dilated loops of bowel. The terminal ileum and appendix appear normal. Uterus is normal. Left ovary appears normal. Right ovary is obscured by the bowel.  No ascites.  No free air.  No acute osseous abnormality.  IMPRESSION: 1. Interval development of extensive hepatic steatosis. 2. Fluid throughout the large and small bowel suggesting gastroenteritis. 3. Tiny bilateral renal calculi, stable. 4. Chronic of biliary ductal dilatation unchanged.   Electronically Signed   By: Francene Boyers M.D.   On: 12/28/2014 20:50   Ct Head Wo Contrast  12/28/2014   CLINICAL DATA:  Near syncope today while using the commode. Weakness for several days.  EXAM: CT HEAD WITHOUT CONTRAST  TECHNIQUE: Contiguous axial images were obtained from the base of the skull through the vertex without intravenous contrast.  COMPARISON:  Head CT 08/01/2014  FINDINGS: No intracranial hemorrhage, mass effect, or midline shift. No hydrocephalus. The basilar cisterns are patent. No evidence of territorial infarct. No intracranial fluid collection. Mild atrophy, similar to prior exam. Atherosclerosis noted about the intracranial  vasculature at the skull base. Calvarium is intact. Included paranasal sinuses and mastoid air cells are well aerated.  IMPRESSION: No acute intracranial abnormality.   Electronically Signed   By: Rubye Oaks M.D.   On: 12/28/2014 20:43   Dg Chest Port 1 View  12/28/2014   CLINICAL DATA:  54 year old female with 1 day history of syncope. Hypertension.  EXAM: PORTABLE CHEST - 1 VIEW  COMPARISON:  Chest x-ray 07/30/2014.  FINDINGS: Lung volumes are normal. No consolidative airspace disease. No pleural effusions. No pneumothorax. No pulmonary nodule or mass noted. Pulmonary vasculature and the cardiomediastinal silhouette are within normal limits.  IMPRESSION: No radiographic evidence of acute cardiopulmonary disease.   Electronically Signed   By: Trudie Reed M.D.   On: 12/28/2014 21:35    Admission HPI: Ms. Baldinger is a 54 year old female with chronic diarrhea of unknown etiology [Crohn's disease versus pancreatic insufficiency], history of acute bilateral PE on anticoagulation, type 2 diabetes with neuropathy who presented to the ED with diarrhea, syncope, dizziness.  3 days ago, she reports going to her orthopedist who she sees for her chronic back pain, coming home, and waking up finding herself covered in diarrhea. Since then, she has nonbloody, watery stools roughly every 30 minutes. 2 days ago, she reports getting up out of bed and then "passing out" and falling back into her bed along with associated dizziness but denies hitting her head. She reports normally that she has 2-3 formed, nonbloody watery stools daily. She also reports associated nausea, headache, midepigastric/right upper quadrant abdominal pain, history of weight loss though unsure though no vomiting, recent changes to her  diet, recent travel, sick contacts, shortness of breath, chest pain. She reports not missing any of her medications, including Creon and sulfasalazine, though does acknowledge occasionally missing her Eliquis for  which she was put on after being found to have acute bilateral PE when she was hospitalized in August 2015. She was at home with her son and has smoked a quarter pack of cigarettes per day since the age of 80 though no recent alcohol or illicit drug use.   Per chart review, she had a colonoscopy performed by Dr. [name] [GI] on 11/12/2002 with findings suggestive of Crohn's disease given universal proctocolitis [friable rectocolonic mucosa associated with deep ulcers adjacent normal appearing mucosa] though biopsy findings were notable for Crohn's disease versus infectious colitis. Most recently, she was hospitalized in August 2015 and late October 2015 for SIRS and thought to have dehydration secondary to diarrhea. Workup for infectious etiologies has been thus far negative. EGD 05/21/2014 with questionable mild gastritis versus gastropathy but no signs of active bleeding or lesions. MR/MRCP 07/30/2014 notable for diffuse biliary ductal dilatation and mild pancreatic ductal dilatation. Per Dr. Dulce Sellar [GI] on her most recent hospitalization in October 2015, diagnosis of Crohn's disease was questionable given no findings on most recent abnormal CT as well as MRI/MRCP, and it was suspected that this may be malabsorption secondary to pancreatic insufficiency given prior history of chronic pancreatitis from alcohol abuse. She was discharged on trial of pancreatic enzymes and low-fat diet with follow-up as outpatient reports following up with Dr. Ewing Schlein about a month ago at which time she was planned to have a repeat colonoscopy.   In the ED, she received 3 L normal saline bolus was found to be FOBT positive. Abdominal CT notable for chronic biliary ductal dilatation unchanged from prior MRI/MRCP, extensive hepatic steatosis as compared to prior imaging, fluid throughout the large and small bowel suggestive of gastroenteritis. Cardiology was consulted for elevated troponin 0.13.   Hospital Course by problem  list: Principal Problem:   Chronic diarrhea of unknown origin Active Problems:   DM2 (diabetes mellitus, type 2)   Anxiety state   Crohn's disease   Metabolic acidosis   Hypokalemia   Leukocytosis   Nicotine abuse   Heme positive stool   Pulmonary embolus   Dehydration   Hypotension   Hepatic steatosis   Elevated troponin   Gastroenteritis   Acute kidney injury   Malnutrition of moderate degree   Acute on Chronic Diarrhea: Patient presented with syncope, orthostatic hypotension, and electrolyte abnormalities in the setting of acute on chronic diarrhea. Abdominal CT showed interval development of extensive hepatic steatosis, fluid throughout the large and small bowel suggesting gastroenteritis and chronic of biliary ductal dilatation unchanged. Fecal lactoferrin negative. Patient was seen by GI who recommended checking C. Diff (negative) and stool pathogen panel (pending). They also recommended checking tissue transglutaminase to rule out Celiac Disease and performing a colonoscopy +/- EGD. Per chart review, she had a colonoscopy performed by Dr. [name] [GI] on 11/12/2002 with findings suggestive of Crohn's disease given universal proctocolitis [friable rectocolonic mucosa associated with deep ulcers adjacent normal appearing mucosa] though biopsy findings were notable for Crohn's disease versus infectious colitis. Most recently, she was hospitalized in August 2015 and late October 2015 for SIRS and thought to have dehydration secondary to diarrhea. Workup for infectious etiologies has been thus far negative. EGD 05/21/2014 with questionable mild gastritis versus gastropathy but no signs of active bleeding or lesions. MR/MRCP 07/30/2014 notable for diffuse biliary  ductal dilatation and mild pancreatic ductal dilatation. Per Dr. Dulce Sellar [GI] on her most recent hospitalization in October 2015, diagnosis of Crohn's disease was questionable given no findings on most recent abnormal CT as well as  MRI/MRCP, and it was suspected that this may be malabsorption secondary to pancreatic insufficiency given prior history of chronic pancreatitis from alcohol abuse. She was discharged on trial of pancreatic enzymes and low-fat diet with follow-up as outpatient reports following up with Dr. Ewing Schlein about a month ago at which time she was planned to have a repeat colonoscopy. On admission, patient was managed with intravenous fluids, Cipro, and Flagyl. Her pancreatic enzymes were discontinued as they did not appear to be improving her diarrhea. C. Difficile, GI pathogen panel, stool culture, and TTG were negative. Blood culture was negative at time of discharge. The patient reported improvement of her diarrhea shortly after admission. She repeatedly refused and postponed her colonoscopy due to multiple complaints, and it could not be completed during the hospitalization. After discussion with GI, she was discharged and follow up was arranged with her gastroenterologist Dr. Ewing Schlein for an outpatient colonoscopy. She completed 5 days of antibiotics, and they were discontinued at discharge. She has continued diarrhea, she may benefit from a laxative screen as the cause of her diarrhea continues to be unclear.  Hypokalemia/hypomagnesemia: Her potassium is chronically low due to her diarrhea, and she was noted to have a magnesium of 1.1. These were repleted IV, and she was discharged on K-Dur 40 mEq daily for continued supplementation at discharge. Please recheck her potassium and magnesium levels at follow-up.  Orthostatic Syncope: Likely setting of her acute diarrheal illness and dehydration. Head CT unremarkable. Orthostatic vital signs positive in setting of dehydration and diarrhea. Her diarrhea was treated as above.  AKI with AG Metabolic Acidosis: She is found to have elevated creatinine of 1.25 on admission. This was thought to be prerenal in the setting of dehydration from her diarrhea. She was given IV fluids,  and her creatinine returned to her baseline of 0.5 prior to discharge.  Elevated Troponin: Patient denied any chest pain. Troponins trended 0.13>0.25>0.17>0.15. In the setting of acute illness, likely secondary to demand ischemia. Cardiology recommended repeat an echocardiogram, which was severely limited due to patient's inability to tolerate contact with the probe. However, it did not show any wall motion abnormality concerning for ischemia.  Patient was on ASA 81 mg daily and rosuvasatin 20 mg daily, which was continued on discharge.  Positive urine culture: Patient had a positive UA and Urine Culture grew Enterobacter sensitive to ciprofloxacin. She was asymptomatic, but she received ciprofloxacin for her possible GI infection.  History of Acute Bilateral PE: Acute nonobstructive bilateral PE noted on CTA 05/08/2014. Patient was written for Eliquis 5 mg BID at home, but she was not taking this medication. It was unclear whether her dates PE was provoked or unprovoked given that she reports being in a similar condition prior to that admission with no family history or personal history of blood clots. However, it was decided to stop the Eliquis on discharge because the patient was not taking the medication anyways. She was maintained with Lovenox is subcutaneous prophylaxis for DVT/PE during the admission.  Malnutrition: Patient eats one meal a day and appears to be malnurished. Weights have fluctuated between roughly 80-110 pounds, both values recorded it appears from her same hospitalization which certainly raises doubt on the accuracy. Anorexia has been mentioned as a possible cause by past providers in addition to  secondary malabsorption from questionable pancreatic insufficiency. Nutrition was consulted and patient was managed with a feeding supplement, vitamin B1, and folate.  Chronic Pain/Anxiety: Patient has chronic pain and anxiety. Per the [facility] database, she last received Valium 5  mg 90 tablets on 12/13/14 from her PCP, Dilaudid 4 mg po TID prn 42 tablets by Dr. Lunette Stands on 12/02/14, Vicodin 10/325 mg  60 tablets by PA Margart Sickles on 11/18/14. Other psychiatric medications that she has include trazodone 25 mg at bedtime as needed for sleep, Effexor 25 mg twice daily. We continued Trazodone 25 mg QHS, Effexor 25 mg BID, and Gabapetin 800 mg TID. She was started on Klonopin 1 mg twice a day when necessary to control her anxiety along with Dilaudid 2 mg every 6 hours as needed for pain. This regimen appeared to control her pain. She reported pain in her left ankle and entire right side of the body, which is consistent with her previous chronic pain. She may benefit from being referred to a pain clinic and psychiatry as an outpatient. She was told to resume her home medications on discharge.  History of Type 2 Diabetes with Neuropathy: A1c 4.9, 05/08/14. Patient is not on any home medications. We continued Gabapentin 800 mg TID during hospitalization and on discharge.  Hypothyroidism: TSH normal 1.508 on 12/29/14. Patient is on Synthroid 25 mcg daily at home. We continued Synthroid 25 mg daily during admission and on discharge.  Discharge Vitals:   BP 107/65 mmHg  Pulse 87  Temp(Src) 98 F (36.7 C) (Oral)  Resp 18  Ht 5\' 3"  (1.6 m)  Wt 93 lb 14.7 oz (42.6 kg)  BMI 16.64 kg/m2  SpO2 100%  LMP 10/01/2010  Discharge Labs:  Results for orders placed or performed during the hospital encounter of 12/28/14 (from the past 24 hour(s))  Basic metabolic panel     Status: Abnormal   Collection Time: 01/02/15  4:20 PM  Result Value Ref Range   Sodium 140 135 - 145 mmol/L   Potassium 3.4 (L) 3.5 - 5.1 mmol/L   Chloride 111 96 - 112 mmol/L   CO2 22 19 - 32 mmol/L   Glucose, Bld 140 (H) 70 - 99 mg/dL   BUN <5 (L) 6 - 23 mg/dL   Creatinine, Ser 1.61 0.50 - 1.10 mg/dL   Calcium 7.2 (L) 8.4 - 10.5 mg/dL   GFR calc non Af Amer >90 >90 mL/min   GFR calc Af Amer >90 >90 mL/min    Anion gap 7 5 - 15  Magnesium     Status: Abnormal   Collection Time: 01/02/15  4:20 PM  Result Value Ref Range   Magnesium 2.6 (H) 1.5 - 2.5 mg/dL   Services Ordered on Discharge: Home health PT. Equipment Ordered on Discharge: Lightweight wheelchair, hospital bed.

## 2014-12-30 NOTE — Progress Notes (Signed)
Give meds to pt, scanned meds, reviewed meds with pt twice, she looked at all meds and took what she wanted. Meds out of packet she refused two white pills. Was not able to document which ones was refused. Pt refuses to take meds. Hiram Comber RN

## 2014-12-30 NOTE — Progress Notes (Signed)
PATIENT REMOVED IV AFTER STATING LAST NIGHT THAT SHE WOULD DO SO. REFUSED TO HAVE NEW IV PLACED AT THIS TIME. NS + 40 KCL ON HOLD FOR NOW.  PATIENT ALSO REFUSING MEDS EXCEPT THOSE INDICATED FOR PAIN.  PATIENT HAS REFUSED A.M. LAB DRAW AS WELL.  PATIENT'S SISTER IN TO VISIT LASTNIGHT. PER SISTER, PATIENT HAS LONG HISTORY OF NONCOMPLIANCE. HER CONTACT INFO HAS BEEN POSTED ON THE CHART, PER HER REQUEST.

## 2014-12-30 NOTE — Progress Notes (Signed)
Utilization review completed.  

## 2014-12-30 NOTE — Progress Notes (Signed)
*  PRELIMINARY RESULTS* Echocardiogram 2D Echocardiogram has been performed.  [name] 12/30/2014, 2:38 PM

## 2014-12-30 NOTE — Progress Notes (Signed)
[facility] Progress Note  Subjective: Patient lethargic today complaining of back and chest pain still with diarrhea, apparently confused last night and refusing meds other than pain medicine  Objective: Vital signs in last 24 hours: Temp:  [97.8 F (36.6 C)-98.2 F (36.8 C)] 97.8 F (36.6 C) (03/31 0453) Pulse Rate:  [85-108] 85 (03/31 0453) Resp:  [14-37] 18 (03/31 0453) BP: (91-118)/(59-89) 118/89 mmHg (03/31 0453) SpO2:  [93 %-100 %] 93 % (03/31 0453) Weight:  [42.82 kg (94 lb 6.4 oz)] 42.82 kg (94 lb 6.4 oz) (03/31 0453) Weight change: 1.996 kg (4 lb 6.4 oz)   PE: Abdomen soft slightly distended with normoactive bowel sounds  Lab Results: Results for orders placed or performed during the hospital encounter of 12/28/14 (from the past 24 hour(s))  Troponin I     Status: Abnormal   Collection Time: 12/29/14 12:53 PM  Result Value Ref Range   Troponin I 0.15 (H) <0.031 ng/mL    Studies/Results: Ct Abdomen Pelvis Wo Contrast  12/28/2014   CLINICAL DATA:  Diarrhea.  Near syncope.  Weakness.  EXAM: CT ABDOMEN AND PELVIS WITHOUT CONTRAST  TECHNIQUE: Multidetector CT imaging of the abdomen and pelvis was performed following the standard protocol without IV contrast.  COMPARISON:  CT scan dated 05/07/2014 and MRI of the abdomen dated 07/30/2014  FINDINGS: There is new diffuse hepatic steatosis. Stable 2.1 cm cyst in the anterior aspect of the dome of the right lobe of the liver. Diffuse chronic biliary ductal dilatation, unchanged. The gallbladder is not distended.  Spleen, pancreas, adrenal glands, and kidneys are normal except for tiny calcifications in the upper pole of the right kidney and in the mid left kidney. No hydronephrosis.  There is fluid throughout the large and small bowel including prominent fluid in the rectum. There are no dilated loops of bowel. The terminal ileum and appendix appear normal. Uterus is normal. Left ovary appears normal. Right ovary is obscured  by the bowel.  No ascites.  No free air.  No acute osseous abnormality.  IMPRESSION: 1. Interval development of extensive hepatic steatosis. 2. Fluid throughout the large and small bowel suggesting gastroenteritis. 3. Tiny bilateral renal calculi, stable. 4. Chronic of biliary ductal dilatation unchanged.   Electronically Signed   By: Francene Boyers M.D.   On: 12/28/2014 20:50   Ct Head Wo Contrast  12/28/2014   CLINICAL DATA:  Near syncope today while using the commode. Weakness for several days.  EXAM: CT HEAD WITHOUT CONTRAST  TECHNIQUE: Contiguous axial images were obtained from the base of the skull through the vertex without intravenous contrast.  COMPARISON:  Head CT 08/01/2014  FINDINGS: No intracranial hemorrhage, mass effect, or midline shift. No hydrocephalus. The basilar cisterns are patent. No evidence of territorial infarct. No intracranial fluid collection. Mild atrophy, similar to prior exam. Atherosclerosis noted about the intracranial vasculature at the skull base. Calvarium is intact. Included paranasal sinuses and mastoid air cells are well aerated.  IMPRESSION: No acute intracranial abnormality.   Electronically Signed   By: Rubye Oaks M.D.   On: 12/28/2014 20:43   Dg Chest Port 1 View  12/28/2014   CLINICAL DATA:  54 year old female with 1 day history of syncope. Hypertension.  EXAM: PORTABLE CHEST - 1 VIEW  COMPARISON:  Chest x-ray 07/30/2014.  FINDINGS: Lung volumes are normal. No consolidative airspace disease. No pleural effusions. No pneumothorax. No pulmonary nodule or mass noted. Pulmonary vasculature and the cardiomediastinal silhouette are within normal limits.  IMPRESSION: No  radiographic evidence of acute cardiopulmonary disease.   Electronically Signed   By: Trudie Reed M.D.   On: 12/28/2014 21:35      Assessment: Acute/chronic diarrhea Hypokalemia and dehydration Elevated troponin  Plan: Await all stool studies which are pending as well as tissue  transglutaminase Await echocardiogram and any other cardiac workup Correct hypokalemia Colonoscopy plus minus EGD  after workup complete. Patient seems confused today and I doubt would tolerate prep right now.        Suzetta Timko C 12/30/2014, 8:18 AM

## 2014-12-30 NOTE — Progress Notes (Signed)
Subjective:  Patient was seen and examined this morning. Patient complains of fatigue, weakness, dizziness. She continues to have diarrhea with a bowel movement overnight and this morning with abdominal pain.   Objective: Vital signs in last 24 hours: Filed Vitals:   12/29/14 1430 12/29/14 1500 12/29/14 2113 12/30/14 0453  BP: 113/75 108/75 91/59 118/89  Pulse:  88 88 85  Temp:  98.2 F (36.8 C) 98 F (36.7 C) 97.8 F (36.6 C)  TempSrc:  Oral Oral Oral  Resp: 14 18 18 18   Height:      Weight:    94 lb 6.4 oz (42.82 kg)  SpO2:  100% 100% 93%   Weight change: 4 lb 6.4 oz (1.996 kg)  Intake/Output Summary (Last 24 hours) at 12/30/14 1317 Last data filed at 12/30/14 1141  Gross per 24 hour  Intake      0 ml  Output    250 ml  Net   -250 ml   General: Vital signs reviewed.  Patient is a thin appearing female, in mild acute distress, tremulous on exam. Cardiovascular: RRR Pulmonary/Chest: Clear to auscultation bilaterally, no wheezes, rales, or rhonchi. Abdominal: Soft, tender diffusely, non-distended, BS +, no masses, organomegaly, or guarding present.  Extremities: No lower extremity edema bilaterally, thin extremities. Neurological: A&O x3 Skin: Decreased skin turgor.  Psychiatric: Normal mood and affect. speech and behavior is normal. Cognition and memory are normal.   Lab Results: Basic Metabolic Panel:  Recent Labs Lab 12/28/14 1905 12/29/14 0225  NA 139 142  K 3.1* 2.7*  CL 102 114*  CO2 15* 15*  GLUCOSE 97 81  BUN 9 5*  CREATININE 1.25* 1.03  CALCIUM 7.4* 6.5*  MG  --  1.3*   Liver Function Tests:  Recent Labs Lab 12/28/14 1905 12/29/14 0225  AST 44* 50*  ALT 23 21  ALKPHOS 159* 142*  BILITOT 0.9 1.2  PROT 5.7* 4.7*  ALBUMIN 2.3* 2.0*    Recent Labs Lab 12/28/14 1905  LIPASE 18   CBC:  Recent Labs Lab 12/28/14 1905 12/29/14 0225 12/29/14 0755  WBC 12.7* 12.4* 13.0*  NEUTROABS 9.5*  --   --   HGB 11.9* 10.6* 10.6*  HCT 38.5 34.2*  35.0*  MCV 92.1 91.4 92.8  PLT 307 267 263   Cardiac Enzymes:  Recent Labs Lab 12/29/14 0220 12/29/14 0755 12/29/14 1253  TROPONINI 0.25* 0.17* 0.15*   CBG:  Recent Labs Lab 12/28/14 1850  GLUCAP 119*   Thyroid Function Tests:  Recent Labs Lab 12/29/14 0220  TSH 1.508   Coagulation:  Recent Labs Lab 12/28/14 1905  LABPROT 13.6  INR 1.03   Urine Drug Screen: Drugs of Abuse     Component Value Date/Time   LABOPIA NONE DETECTED 12/29/2014 0254   LABOPIA POSITIVE* 07/30/2011 1337   COCAINSCRNUR NONE DETECTED 12/29/2014 0254   COCAINSCRNUR NEGATIVE 07/30/2011 1337   LABBENZ POSITIVE* 12/29/2014 0254   LABBENZ POSITIVE* 07/30/2011 1337   AMPHETMU NONE DETECTED 12/29/2014 0254   AMPHETMU NEGATIVE 07/30/2011 1337   THCU NONE DETECTED 12/29/2014 0254   LABBARB NONE DETECTED 12/29/2014 0254    Alcohol Level:  Recent Labs Lab 12/29/14 0220  ETH <5   Urinalysis:  Recent Labs Lab 12/29/14 0254  COLORURINE YELLOW  LABSPEC 1.010  PHURINE 6.0  GLUCOSEU NEGATIVE  HGBUR MODERATE*  BILIRUBINUR NEGATIVE  KETONESUR 15*  PROTEINUR NEGATIVE  UROBILINOGEN 0.2  NITRITE POSITIVE*  LEUKOCYTESUR MODERATE*   Studies/Results: Ct Abdomen Pelvis Wo Contrast  12/28/2014  CLINICAL DATA:  Diarrhea.  Near syncope.  Weakness.  EXAM: CT ABDOMEN AND PELVIS WITHOUT CONTRAST  TECHNIQUE: Multidetector CT imaging of the abdomen and pelvis was performed following the standard protocol without IV contrast.  COMPARISON:  CT scan dated 05/07/2014 and MRI of the abdomen dated 07/30/2014  FINDINGS: There is new diffuse hepatic steatosis. Stable 2.1 cm cyst in the anterior aspect of the dome of the right lobe of the liver. Diffuse chronic biliary ductal dilatation, unchanged. The gallbladder is not distended.  Spleen, pancreas, adrenal glands, and kidneys are normal except for tiny calcifications in the upper pole of the right kidney and in the mid left kidney. No hydronephrosis.  There is  fluid throughout the large and small bowel including prominent fluid in the rectum. There are no dilated loops of bowel. The terminal ileum and appendix appear normal. Uterus is normal. Left ovary appears normal. Right ovary is obscured by the bowel.  No ascites.  No free air.  No acute osseous abnormality.  IMPRESSION: 1. Interval development of extensive hepatic steatosis. 2. Fluid throughout the large and small bowel suggesting gastroenteritis. 3. Tiny bilateral renal calculi, stable. 4. Chronic of biliary ductal dilatation unchanged.   Electronically Signed   By: Francene Boyers M.D.   On: 12/28/2014 20:50   Ct Head Wo Contrast  12/28/2014   CLINICAL DATA:  Near syncope today while using the commode. Weakness for several days.  EXAM: CT HEAD WITHOUT CONTRAST  TECHNIQUE: Contiguous axial images were obtained from the base of the skull through the vertex without intravenous contrast.  COMPARISON:  Head CT 08/01/2014  FINDINGS: No intracranial hemorrhage, mass effect, or midline shift. No hydrocephalus. The basilar cisterns are patent. No evidence of territorial infarct. No intracranial fluid collection. Mild atrophy, similar to prior exam. Atherosclerosis noted about the intracranial vasculature at the skull base. Calvarium is intact. Included paranasal sinuses and mastoid air cells are well aerated.  IMPRESSION: No acute intracranial abnormality.   Electronically Signed   By: Rubye Oaks M.D.   On: 12/28/2014 20:43   Dg Chest Port 1 View  12/28/2014   CLINICAL DATA:  54 year old female with 1 day history of syncope. Hypertension.  EXAM: PORTABLE CHEST - 1 VIEW  COMPARISON:  Chest x-ray 07/30/2014.  FINDINGS: Lung volumes are normal. No consolidative airspace disease. No pleural effusions. No pneumothorax. No pulmonary nodule or mass noted. Pulmonary vasculature and the cardiomediastinal silhouette are within normal limits.  IMPRESSION: No radiographic evidence of acute cardiopulmonary disease.    Electronically Signed   By: Trudie Reed M.D.   On: 12/28/2014 21:35   Medications: I have reviewed the patient's current medications. Prior to Admission:  Prescriptions prior to admission  Medication Sig Dispense Refill Last Dose  . alprazolam (XANAX) 2 MG tablet Take 0.5 tablets (1 mg total) by mouth 3 (three) times daily as needed for sleep or anxiety. 10 tablet 0 Past Month at Unknown time  . apixaban (ELIQUIS) 5 MG TABS tablet Take 1 tablet (5 mg total) by mouth 2 (two) times daily. 60 tablet 0 12/27/2014 at Unknown time  . feeding supplement, RESOURCE BREEZE, (RESOURCE BREEZE) LIQD Take 1 Container by mouth 3 (three) times daily between meals.  0 12/27/2014 at Unknown time  . folic acid (FOLVITE) 1 MG tablet Take 1 tablet (1 mg total) by mouth daily. 30 tablet 0 12/27/2014 at Unknown time  . gabapentin (NEURONTIN) 800 MG tablet Take 1 tablet (800 mg total) by mouth 3 (three) times daily.  90 tablet 0 12/28/2014 at Unknown time  . HYDROcodone-acetaminophen (NORCO) 10-325 MG per tablet Take 1 tablet by mouth every 6 (six) hours as needed. for pain  0 Past Week at Unknown time  . HYDROmorphone (DILAUDID) 4 MG tablet Take 4 mg by mouth as needed for severe pain.   0 Past Week at Unknown time  . levothyroxine (SYNTHROID, LEVOTHROID) 25 MCG tablet Take 1 tablet (25 mcg total) by mouth daily before breakfast. 30 tablet 0 12/27/2014 at Unknown time  . lipase/protease/amylase (CREON) 36000 UNITS CPEP capsule Take 1 capsule (36,000 Units total) by mouth 3 (three) times daily with meals. 90 capsule 1 Past Week at Unknown time  . meclizine (ANTIVERT) 25 MG tablet Take 25 mg by mouth 3 (three) times daily as needed for dizziness.   Past Week at Unknown time  . naproxen sodium (ANAPROX) 220 MG tablet Take 220 mg by mouth as needed (for pain).   Past Week at Unknown time  . ondansetron (ZOFRAN) 4 MG tablet Take 4 mg by mouth every 8 (eight) hours as needed for nausea or vomiting.   Past Week at Unknown time    . rosuvastatin (CRESTOR) 20 MG tablet Take 1 tablet (20 mg total) by mouth daily. 30 tablet 0 12/27/2014 at Unknown time  . sucralfate (CARAFATE) 1 G tablet Take 1 tablet (1 g total) by mouth 2 (two) times daily. 60 tablet 0 12/27/2014 at Unknown time  . sulfaSALAzine (AZULFIDINE) 500 MG tablet Take 2 tablets (1,000 mg total) by mouth daily. 30 tablet 0 12/27/2014 at Unknown time  . thiamine (VITAMIN B-1) 100 MG tablet Take 1 tablet (100 mg total) by mouth daily. 30 tablet 0 12/28/2014 at Unknown time  . traZODone (DESYREL) 25 mg TABS tablet Take 0.5 tablets (25 mg total) by mouth at bedtime as needed for sleep. 30 tablet 0 Past Week at Unknown time  . venlafaxine (EFFEXOR) 25 MG tablet Take 1 tablet (25 mg total) by mouth 2 (two) times daily. 30 tablet 0 12/27/2014 at Unknown time  . Water For Irrigation, Sterile (FREE WATER) SOLN Place 200 mLs into feeding tube every 8 (eight) hours.   12/27/2014 at Unknown time  . HYDROmorphone (DILAUDID) 2 MG tablet Take 0.5 tablets (1 mg total) by mouth every 6 (six) hours as needed for moderate pain or severe pain. 10 tablet 0   . pantoprazole (PROTONIX) 20 MG tablet Take 2 tablets (40 mg total) by mouth daily. 30 tablet 1    Scheduled Meds: . aspirin EC  81 mg Oral Daily  . ciprofloxacin  500 mg Oral BID  . enoxaparin (LOVENOX) injection  30 mg Subcutaneous Q24H  . feeding supplement (RESOURCE BREEZE)  1 Container Oral TID BM  . folic acid  1 mg Oral Daily  . gabapentin  800 mg Oral TID  . levothyroxine  25 mcg Oral QAC breakfast  . metroNIDAZOLE  500 mg Oral 3 times per day  . pantoprazole  40 mg Oral Daily  . rosuvastatin  20 mg Oral Daily  . sodium chloride  3 mL Intravenous Q12H  . sucralfate  1 g Oral BID  . sulfaSALAzine  1,000 mg Oral Daily  . thiamine  100 mg Oral Daily  . venlafaxine  25 mg Oral BID   Continuous Infusions: . 0.9 % NaCl with KCl 40 mEq / L 100 mL/hr (12/30/14 1204)   PRN Meds:.clonazePAM, HYDROmorphone, meclizine,  ondansetron, traZODone Assessment/Plan: Principal Problem:   SIRS (systemic inflammatory response syndrome) Active  Problems:   DM2 (diabetes mellitus, type 2)   Anxiety state   Crohn's disease   Metabolic acidosis   Hypokalemia   Leukocytosis   Nicotine abuse   Heme positive stool   Pulmonary embolus   Dehydration   Hypotension   Hepatic steatosis   Diarrhea   Elevated troponin   Gastroenteritis   Acute kidney injury  Acute on Chronic Diarrhea: Patient has 2 episodes of non-bloody diarrhea overnight. Abdominal CT showed interval development of extensive hepatic steatosis, fluid throughout the large and small bowel suggesting gastroenteritis and chronic of biliary ductal dilatation unchanged. Fecal lactoferrin negative. Patient was seen by GI who recommended checking C. Diff (negative) and stool pathogen panel (pending). They also recommended checking tissue transglutaminase to rule out Celiac Disease and performing a colonoscopy +/- EGD. Previous coloscopies have shown signs of Crohn's disease, but more recently this has been questionable per GI notes. Patient pulled out IV overnight per nursing notes and refused lab draws, but patient is not willing to have both this morning. -GI following, appreciate recommendations -NS with KCl 40 mEq/Lat 125 cc/hr -Cipro/Flagyl  -GI pathogen panel pending -Blood cultures >> NGTD -Repeat CBC/CMET tomorrow am -Regular diet -Continue Zofran 4 mg Q8H as needed for nausea/vomiting -Continue Protonix 40 mg daily -Continue sucralfate 1 g twice daily -Continue sulfasalazine 1000 mg daily -Discontinue Creon 36,000 units 3 times daily with meals (unlikely pancreatitic insufficiency)  -Tissue transglutaminase pending -Colonoscopy +/- EGD once medically stable  UTI: Patient had a positive UA and Urine Culture grew gram-negative rods. Patient is asymptomatic and denies dysuria or increased frequency.  -On Ciprofloxacin  Hypokalemia: 3.1>2.7. Repeat  is still pending from this morning as patient refused initial blood draw and then the second attempt was insufficient quantity. Called lab to redraw. We have been replacing with IV NS with KCl.  -CMET -Repeat BMET tomorrow am -NS with KCl 40 mEq/L at 125 cc/hr   Orthostatic Syncope: Likely setting of her acute diarrheal illness. Head CT unremarkable. Orthostatic vital signs positive in setting of dehydration and diarrhea.  -Continue Antivert 25 mg TID prn  -Repeat orthostatics tomorrow am -NS with KCl 40 mEq/L at 125 cc/hr  AKI with AG Metabolic Acidosis: Resolving. Creatinine 1.25>1.03 yesterday, baseline 0.5 0.6. Anion gap 22>13. Repeat creatinine and anion gap is still pending from this morning as patient refused initial blood draw and then the second attempt was insufficient quantity. Called lab to redraw.  Likely secondary to her acute diarrheal illness given bicarbonate losses. -Repeat BMET tomorrow am -NS with KCl 40 mEq/L at 125 cc/hr   Elevated Troponin: Troponin 0.13>0.25>0.17>0.15. In the setting of acute illness, likely secondary to demand ischemia. Cardiology recommends repeat echo, if EF normal, no further cardiac work up.  -Cardiology following, appreciate recommendations -Continue rosuvastatin 20 mg daily -ASA 81 mg daily -Monitor on telemetry -TTE  History of Acute Bilateral PE: Acute nonobstructive bilateral PE noted on CTA 05/08/2014. Patient was on Eliquis 5 mg BID at home. Unclear if provoked or unprovoked given that she reports being in a similar condition prior to that admission with no family history or personal history of blood clots.  -Lovenox SQ QD for DVT/PE ppx  Malnutrition: Patient eats one meal a day. Weights have fluctuated between roughly 80-110 pounds, both values recorded it appears from her same hospitalization which certainly raises doubt on the accuracy. Anorexia has been mentioned as a possible cause by past providers in addition to secondary  malabsorption from pancreatic insufficiency.  -Consult to  nutrition  -Continue feeding supplement -Continue vitamin B1 100 mg -Continue folate -Regular diet -Daily weights  Chronic Pain/Anxiety: Per [facility] database, she last received Valium 5 mg 90 tablets on 12/13/14 from her PCP, Dilaudid 4 mg po TID prn 42 tablets by Dr. Lunette Stands on 12/02/14, Vicodin 10/325 mg  60 tablets by PA Margart Sickles on 11/18/14. Other psychiatric medications that she has include trazodone 25 mg at bedtime as needed for sleep, Effexor 25 mg twice daily. -Continue Trazodone 25 mg QHS -Continue Effexor 25 mg BID -Dilaudid 1 mg po Q6H prn  -Klonopin 1 mg BID prn  -Gabapentin 800 mg TID  FEN:  -Diet: Regular  DVT prophylaxis: Lovenox 30 mg SQ QD  CODE STATUS: FULL CODE -Defer to her son [facility] [place] [phone] if patients lacks decision-making capacity -Confirmed with patient on admission  Dispo: Disposition is deferred at this time, awaiting improvement of current medical problems.  Anticipated discharge in approximately 1-2 day(s).   The patient does have a current PCP (Ibethal Georgette Dover, MD) and does not need an [facility] hospital follow-up appointment after discharge.  The patient does not have transportation limitations that hinder transportation to clinic appointments.  .Services Needed at time of discharge: Y = Yes, Blank = No PT:   OT:   RN:   Equipment:   Other:     LOS: 1 day   Jill Alexanders, DO PGY-1 Internal Medicine Resident Pager # [phone] 12/30/2014 1:17 PM

## 2014-12-30 NOTE — Progress Notes (Signed)
INITIAL NUTRITION ASSESSMENT  DOCUMENTATION CODES Per approved criteria  -Non-severe (moderate) malnutrition in the context of acute illness or injury   Pt meets criteria for moderate MALNUTRITION in the context of acute illness as evidenced by mild fat and muscle depletion.  INTERVENTION: Continue with Resource Breeze po TID, each supplement provides 250 kcal and 9 grams of protein  NUTRITION DIAGNOSIS: Inadequate oral intake related to altered GI function as evidenced by PO <25%.   Goal: Pt will meet >90% of estimated nutritional needs  Monitor:  PO/supplement intake, labs, weight changes, I/O's  Reason for Assessment: Consult to assess needs  54 y.o. female  Admitting Dx: SIRS (systemic inflammatory response syndrome)  54 year old woman with a history of chronic diarrhea spanning at least a 12 year interval. Colonoscopy done back in February 2004 compatible with Crohn's colitis with ulcerated rectal and colonic mucosa. She was put on sulfasalazine at that time which she continues. She thinks that this has made a significant difference although she still reports 3-4 flareups a year of diarrhea. At the time of a October 2015 admission, diagnosis of Crohn's disease was questioned since there were no typical findings on a CT scan. Possibility of malabsorption secondary to pancreatic insufficiency was entertained in view of a past history of alcohol use. She has not used alcohol for a number of years. No pancreatic or biliary pathology was noted on MRCPdone at that time but she was put on a trial of pancreatic enzymes.  ASSESSMENT: Pt admitted with SIRS.  Pt was difficult to obtain hx from. She was selective about which questions she answered and did not respond to some of questions asked, even when probed or question was rephrased. Nutrition-focused physical exam not fully completed due to pt not fully following commands or requests. Pt appears to be small-framed at baseline. Pt  endorses both poor appetite and weight loss over the past two months. She reports UBW of 125#, which she last weighed 2 months ago, however, this is not consistent with documented wt records (pt wt has fluctuated between 84-104# over the past 3-4 years). Noted a 9.6% wt loss over the past 7 months.  Pt reports that her appetite is currently very poor and only ate her fruit and milk off her breakfast tray this AM. She reveals that she was drinking supplements PTA and would like to continue supplements while here. Will continue with Resource Breeze supplement for now due to GI issues.  Per MD notes, plan is for colonoscopy when medical condition optimized. Also to check c-diff and tissue transglutaminaze to r/o celiac disease.  Labs reviewed. K: 2.7, Cl: 114, CO2: 15, BUN: 5, Calcium: 6.5, Mg: 1.3, CBGS: 119.   Nutrition Focused Physical Exam:  Subcutaneous Fat:  Orbital Region: mild depletion Upper Arm Region: mild depletion Thoracic and Lumbar Region: WDL  Muscle:  Temple Region: mild depletion Clavicle Bone Region: WDL Clavicle and Acromion Bone Region: WDL Scapular Bone Region: WDL Dorsal Hand: WDL Patellar Region: did not assess Anterior Thigh Region: did not assess Posterior Calf Region: did not assess  Edema: none present  Height: Ht Readings from Last 1 Encounters:  12/28/14 5\' 3"  (1.6 m)    Weight: Wt Readings from Last 1 Encounters:  12/30/14 94 lb 6.4 oz (42.82 kg)    Ideal Body Weight: 115#  % Ideal Body Weight: 82%  Wt Readings from Last 10 Encounters:  12/30/14 94 lb 6.4 oz (42.82 kg)  08/02/14 95 lb 8 oz (43.319 kg)  05/19/14 104  lb 4.8 oz (47.31 kg)  05/12/14 85 lb 15.7 oz (39 kg)  10/05/13 92 lb (41.731 kg)  10/20/12 102 lb 15.3 oz (46.7 kg)  07/30/11 94 lb 15.9 oz (43.09 kg)    Usual Body Weight: 102#  % Usual Body Weight: 92%  BMI:  Body mass index is 16.73 kg/(m^2). Underweight  Estimated Nutritional Needs: Kcal: 1300-1500 Protein: 55-65  grams Fluid: 1.3-1.5 L  Skin: WDL  Diet Order: Diet regular Room service appropriate?: Yes; Fluid consistency:: Thin  EDUCATION NEEDS: -Education not appropriate at this time   Intake/Output Summary (Last 24 hours) at 12/30/14 0944 Last data filed at 12/29/14 1122  Gross per 24 hour  Intake      3 ml  Output      0 ml  Net      3 ml    Last BM: 12/29/14  Labs:   Recent Labs Lab 12/28/14 1905 12/29/14 0225  NA 139 142  K 3.1* 2.7*  CL 102 114*  CO2 15* 15*  BUN 9 5*  CREATININE 1.25* 1.03  CALCIUM 7.4* 6.5*  MG  --  1.3*  GLUCOSE 97 81    CBG (last 3)   Recent Labs  12/28/14 1850  GLUCAP 119*    Scheduled Meds: . aspirin EC  81 mg Oral Daily  . ciprofloxacin  500 mg Oral BID  . enoxaparin (LOVENOX) injection  30 mg Subcutaneous Q24H  . feeding supplement (RESOURCE BREEZE)  1 Container Oral TID BM  . folic acid  1 mg Oral Daily  . gabapentin  800 mg Oral TID  . levothyroxine  25 mcg Oral QAC breakfast  . lipase/protease/amylase  36,000 Units Oral TID WC  . metroNIDAZOLE  500 mg Oral 3 times per day  . pantoprazole  40 mg Oral Daily  . rosuvastatin  20 mg Oral Daily  . sodium chloride  3 mL Intravenous Q12H  . sucralfate  1 g Oral BID  . sulfaSALAzine  1,000 mg Oral Daily  . thiamine  100 mg Oral Daily  . venlafaxine  25 mg Oral BID    Continuous Infusions: . 0.9 % NaCl with KCl 40 mEq / L 125 mL/hr (12/30/14 1610)    Past Medical History  Diagnosis Date  . Glaucoma   . Diabetes mellitus without complication   . Hypertension   . Neuropathy   . Crohn disease   . Asthma   . Pulmonary embolus 05/24/2014    Past Surgical History  Procedure Laterality Date  . Cesarean section    . Esophagogastroduodenoscopy N/A 05/21/2014    Procedure: ESOPHAGOGASTRODUODENOSCOPY (EGD);  Surgeon: Petra Kuba, MD;  Location: [facility] ENDOSCOPY;  Service: Endoscopy;  Laterality: N/A;    Akshita Italiano A. Mayford Knife, RD, LDN, CDE Pager: [phone] After hours Pager:  [phone]

## 2014-12-30 NOTE — Progress Notes (Signed)
Patient ID: [name], female   DOB: Mar 17, 1961, 54 y.o.   MRN: [id number] Medicine attending: Clinical status and management plan reviewed at the bedside with resident physician Dr. Jill Alexanders. We appreciate input from gastroenterology and cardiology. The patient is anxious and somewhat belligerent this morning. She is requesting to go back on her outpatient narcotics. She is still having low-grade diarrhea. C. difficile negative. Fecal lactoferrin negative. Additional cultures pending. GI recommending checking for tissue transglutaminase to rule out adult celiac disease. Endoscopy planned when patient is more stable. Cardiology feels initial minor  elevation of troponin with flat curve not suggestive of active cardiac ischema . Echocardiogram will be done. If this is unremarkable, no further workup recommended at this time. Patient has not had any ischemic type chest pain or pressure. Troponins ordered randomly by the emergency room. Potassium replacement in progress. Today's labs pending. Patient has poor peripheral venous access and technician having difficulty obtaining a specimen. We will explore indications for her chronic narcotic pain regimen.

## 2014-12-31 LAB — GI PATHOGEN PANEL BY PCR, STOOL
C difficile toxin A/B: NOT DETECTED
CAMPYLOBACTER BY PCR: NOT DETECTED
CRYPTOSPORIDIUM BY PCR: NOT DETECTED
E COLI (ETEC) LT/ST: NOT DETECTED
E coli (STEC): NOT DETECTED
E coli 0157 by PCR: NOT DETECTED
G lamblia by PCR: NOT DETECTED
NOROVIRUS G1/G2: NOT DETECTED
ROTAVIRUS A BY PCR: NOT DETECTED
Salmonella by PCR: NOT DETECTED
Shigella by PCR: NOT DETECTED

## 2014-12-31 LAB — CBC
HEMATOCRIT: 30.4 % — AB (ref 36.0–46.0)
Hemoglobin: 9.4 g/dL — ABNORMAL LOW (ref 12.0–15.0)
MCH: 28.2 pg (ref 26.0–34.0)
MCHC: 30.9 g/dL (ref 30.0–36.0)
MCV: 91.3 fL (ref 78.0–100.0)
Platelets: 318 10*3/uL (ref 150–400)
RBC: 3.33 MIL/uL — ABNORMAL LOW (ref 3.87–5.11)
RDW: 16.8 % — AB (ref 11.5–15.5)
WBC: 11.2 10*3/uL — AB (ref 4.0–10.5)

## 2014-12-31 LAB — URINE CULTURE: Colony Count: >=100000

## 2014-12-31 LAB — BASIC METABOLIC PANEL
Anion gap: 4 — ABNORMAL LOW (ref 5–15)
BUN: <5 mg/dL — ABNORMAL LOW (ref 6–23)
CALCIUM: 7.1 mg/dL — AB (ref 8.4–10.5)
CO2: 15 mmol/L — AB (ref 19–32)
CREATININE: 0.52 mg/dL (ref 0.50–1.10)
Chloride: 125 mmol/L — ABNORMAL HIGH (ref 96–112)
GFR calc Af Amer: >90 mL/min (ref >90)
GLUCOSE: 74 mg/dL (ref 70–99)
Potassium: 3.7 mmol/L (ref 3.5–5.1)
SODIUM: 144 mmol/L (ref 135–145)

## 2014-12-31 LAB — TISSUE TRANSGLUTAMINASE, IGA: Tissue Transglutaminase Ab, IgA: <2 U/mL (ref 0–3)

## 2014-12-31 MED ORDER — LOPERAMIDE HCL 2 MG PO CAPS
4.0000 mg | ORAL_CAPSULE | ORAL | Status: DC | PRN
  Administered 2014-12-31: 4 mg via ORAL
  Filled 2014-12-31: qty 2

## 2014-12-31 MED ORDER — CLONAZEPAM 1 MG PO TABS
1.0000 mg | ORAL_TABLET | Freq: Two times a day (BID) | ORAL | Status: DC | PRN
Start: 2014-12-31 — End: 2015-01-03
  Administered 2014-12-31 – 2015-01-03 (×3): 1 mg via ORAL
  Filled 2014-12-31 (×4): qty 1

## 2014-12-31 MED ORDER — HYDROMORPHONE HCL 2 MG PO TABS
2.0000 mg | ORAL_TABLET | Freq: Four times a day (QID) | ORAL | Status: DC | PRN
  Administered 2014-12-31 – 2015-01-03 (×10): 2 mg via ORAL
  Filled 2014-12-31 (×10): qty 1

## 2014-12-31 NOTE — Care Management Note (Signed)
    Page 1 of 1   12/31/2014     4:42:05 PM CARE MANAGEMENT NOTE 12/31/2014  Patient:  [name], [name]   Account Number:  [id number]  Date Initiated:  12/31/2014  Documentation initiated by:  Donn Pierini  Subjective/Objective Assessment:   Pt admitted with SIRS     Action/Plan:   PTA Pt lived at home with son- she is active with [facility] services - PT eval pending   Anticipated DC Date:  01/03/2015   Anticipated DC Plan:  HOME/SELF CARE         Choice offered to / List presented to:             [facility] agency  [facility]   Status of service:  In process, will continue to follow Medicare Important Message given?  YES (If response is "NO", the following Medicare IM given date fields will be blank) Date Medicare IM given:  12/31/2014 Medicare IM given by:  Donn Pierini Date Additional Medicare IM given:   Additional Medicare IM given by:    Discharge Disposition:    Per UR Regulation:    If discussed at Long Length of Stay Meetings, dates discussed:    Comments:  12/31/14- 1600- Donn Pierini RN, BSN [phone] Spoke with pt at bedside- pt reports that she has [facility] at home- there are a few things that [facility] was to be working with pt on such as getting pt a BP cuff for home, bed rails, etc. will f/u with [facility] regarding these things to see where they are at in assisting pt. PT eval pending- will f/u on recommendations

## 2014-12-31 NOTE — Progress Notes (Signed)
    Subjective:  No chest pain or shortness of breath. She complains of back and neck pain.  Objective:  Vital Signs in the last 24 hours: Temp:  [98.5 F (36.9 C)-98.6 F (37 C)] 98.6 F (37 C) (04/01 0507) Pulse Rate:  [85-87] 87 (04/01 0507) Resp:  [18] 18 (04/01 0507) BP: (86-100)/(56-78) 100/78 mmHg (04/01 0507) SpO2:  [99 %] 99 % (04/01 0507) Weight:  [97 lb 3.6 oz (44.1 kg)] 97 lb 3.6 oz (44.1 kg) (04/01 0507)  Intake/Output from previous day: 03/31 0701 - 04/01 0700 In: -  Out: 451 [Urine:450; Stool:1]  Physical Exam: Pt is alert and oriented, NAD HEENT: normal Neck: JVP - normal Lungs: CTA bilaterally CV: RRR without murmur or gallop Abd: soft, NT Ext: no edema Skin: warm/dry no rash   Lab Results:  Recent Labs  12/30/14 1304 12/31/14 0418  WBC 9.7 11.2*  HGB 9.2* 9.4*  PLT 234 318    Recent Labs  12/30/14 1550 12/31/14 0418  NA 147* 144  K 3.9 3.7  CL 124* 125*  CO2 16* 15*  GLUCOSE 131* 74  BUN <5* <5*  CREATININE 0.64 0.52    Recent Labs  12/29/14 0755 12/29/14 1253  TROPONINI 0.17* 0.15*    Cardiac Studies: Echo:Study Conclusions  - Left ventricle: The cavity size was normal. There was mild concentric hypertrophy. Systolic function was normal. The estimated ejection fraction was in the range of 55% to 60%. Although no diagnostic regional wall motion abnormality was identified, this possibility cannot be completely excluded on the basis of this study. The study is not technically sufficient to allow evaluation of LV diastolic function.  Assessment/Plan:  Minimal troponin elevation with flat curve. 2-D echo shows normal LV function.  Symptoms are not consistent with acute coronary syndrome. She does not require any further cardiac evaluation and is cleared for any GI evaluation needed. Will sign off.    Peter Swaz[place], M.D. 12/31/2014, 8:41 AM

## 2014-12-31 NOTE — Progress Notes (Signed)
Subjective:  Pt states her diarrhea is somewhat better today.  She is complaining about her pain medications stating she needs it every 6 hours; but her orders are written for q6hrs.  She appears somewhat agitated and is very demanding regarding pain meds.     Objective: Vital signs in last 24 hours: Filed Vitals:   12/30/14 0453 12/30/14 2023 12/31/14 0507 12/31/14 0700  BP: 118/89 86/56 100/78 105/82  Pulse: 85 85 87 85  Temp: 97.8 F (36.6 C) 98.5 F (36.9 C) 98.6 F (37 C) 98.1 F (36.7 C)  TempSrc: Oral Oral Oral Oral  Resp: Height:      Weight: 42.82 kg (94 lb 6.4 oz)  44.1 kg (97 lb 3.6 oz)   SpO2: 93% 99% 99% 100%   Weight change: 1.281 kg (2 lb 13.2 oz)  Intake/Output Summary (Last 24 hours) at 12/31/14 1156 Last data filed at 12/31/14 0508  Gross per 24 hour  Intake      0 ml  Output    201 ml  Net   -201 ml   General: Vital signs reviewed.  Patient is a thin chronically ill appearing female, in NAD. Cardiovascular: RRR Pulmonary/Chest: CTAB, no wheezes, rales, or rhonchi. Abdominal: Soft, NT/ND, BS+, no masses, organomegaly, or guarding present.  Extremities: No LE edema bilaterally, thin extremities. Neurological: A&O x3 Skin: Decreased skin turgor.  Psychiatric: Agitated.     Lab Results: Basic Metabolic Panel:  Recent Labs Lab 12/29/14 0225 12/30/14 1550 12/31/14 0418  NA 142 147* 144  K 2.7* 3.9 3.7  CL 114* 124* 125*  CO2 15* 16* 15*  GLUCOSE 81 131* 74  BUN 5* <5* <5*  CREATININE 1.03 0.64 0.52  CALCIUM 6.5* 7.1* 7.1*  MG 1.3*  --   --    Liver Function Tests:  Recent Labs Lab 12/29/14 0225 12/30/14 1550  AST 50* 113*  ALT 21 32  ALKPHOS 142* 119*  BILITOT 1.2 0.4  PROT 4.7* 4.4*  ALBUMIN 2.0* 1.8*    Recent Labs Lab 12/28/14 1905  LIPASE 18   CBC:  Recent Labs Lab 12/28/14 1905  12/30/14 1304 12/31/14 0418  WBC 12.7*  < > 9.7 11.2*  NEUTROABS 9.5*  --   --   --   HGB 11.9*  < > 9.2* 9.4*  HCT 38.5   < > 29.9* 30.4*  MCV 92.1  < > 91.4 91.3  PLT 307  < > 234 318  < > = values in this interval not displayed. Cardiac Enzymes:  Recent Labs Lab 12/29/14 0220 12/29/14 0755 12/29/14 1253  TROPONINI 0.25* 0.17* 0.15*   CBG:  Recent Labs Lab 12/28/14 1850  GLUCAP 119*   Thyroid Function Tests:  Recent Labs Lab 12/29/14 0220  TSH 1.508   Coagulation:  Recent Labs Lab 12/28/14 1905  LABPROT 13.6  INR 1.03   Urine Drug Screen: Drugs of Abuse     Component Value Date/Time   LABOPIA NONE DETECTED 12/29/2014 0254   LABOPIA POSITIVE* 07/30/2011 1337   COCAINSCRNUR NONE DETECTED 12/29/2014 0254   COCAINSCRNUR NEGATIVE 07/30/2011 1337   LABBENZ POSITIVE* 12/29/2014 0254   LABBENZ POSITIVE* 07/30/2011 1337   AMPHETMU NONE DETECTED 12/29/2014 0254   AMPHETMU NEGATIVE 07/30/2011 1337   THCU NONE DETECTED 12/29/2014 0254   LABBARB NONE DETECTED 12/29/2014 0254    Alcohol Level:  Recent Labs Lab 12/29/14 0220  ETH <5   Urinalysis:  Recent Labs Lab 12/29/14 0254  COLORURINE  YELLOW  LABSPEC 1.010  PHURINE 6.0  GLUCOSEU NEGATIVE  HGBUR MODERATE*  BILIRUBINUR NEGATIVE  KETONESUR 15*  PROTEINUR NEGATIVE  UROBILINOGEN 0.2  NITRITE POSITIVE*  LEUKOCYTESUR MODERATE*   Studies/Results: No results found. Medications: I have reviewed the patient's current medications. Prior to Admission:  Prescriptions prior to admission  Medication Sig Dispense Refill Last Dose  . alprazolam (XANAX) 2 MG tablet Take 0.5 tablets (1 mg total) by mouth 3 (three) times daily as needed for sleep or anxiety. 10 tablet 0 Past Month at Unknown time  . apixaban (ELIQUIS) 5 MG TABS tablet Take 1 tablet (5 mg total) by mouth 2 (two) times daily. 60 tablet 0 12/27/2014 at Unknown time  . feeding supplement, RESOURCE BREEZE, (RESOURCE BREEZE) LIQD Take 1 Container by mouth 3 (three) times daily between meals.  0 12/27/2014 at Unknown time  . folic acid (FOLVITE) 1 MG tablet Take 1 tablet (1  mg total) by mouth daily. 30 tablet 0 12/27/2014 at Unknown time  . gabapentin (NEURONTIN) 800 MG tablet Take 1 tablet (800 mg total) by mouth 3 (three) times daily. 90 tablet 0 12/28/2014 at Unknown time  . HYDROcodone-acetaminophen (NORCO) 10-325 MG per tablet Take 1 tablet by mouth every 6 (six) hours as needed. for pain  0 Past Week at Unknown time  . HYDROmorphone (DILAUDID) 4 MG tablet Take 4 mg by mouth as needed for severe pain.   0 Past Week at Unknown time  . levothyroxine (SYNTHROID, LEVOTHROID) 25 MCG tablet Take 1 tablet (25 mcg total) by mouth daily before breakfast. 30 tablet 0 12/27/2014 at Unknown time  . lipase/protease/amylase (CREON) 36000 UNITS CPEP capsule Take 1 capsule (36,000 Units total) by mouth 3 (three) times daily with meals. 90 capsule 1 Past Week at Unknown time  . meclizine (ANTIVERT) 25 MG tablet Take 25 mg by mouth 3 (three) times daily as needed for dizziness.   Past Week at Unknown time  . naproxen sodium (ANAPROX) 220 MG tablet Take 220 mg by mouth as needed (for pain).   Past Week at Unknown time  . ondansetron (ZOFRAN) 4 MG tablet Take 4 mg by mouth every 8 (eight) hours as needed for nausea or vomiting.   Past Week at Unknown time  . rosuvastatin (CRESTOR) 20 MG tablet Take 1 tablet (20 mg total) by mouth daily. 30 tablet 0 12/27/2014 at Unknown time  . sucralfate (CARAFATE) 1 G tablet Take 1 tablet (1 g total) by mouth 2 (two) times daily. 60 tablet 0 12/27/2014 at Unknown time  . sulfaSALAzine (AZULFIDINE) 500 MG tablet Take 2 tablets (1,000 mg total) by mouth daily. 30 tablet 0 12/27/2014 at Unknown time  . thiamine (VITAMIN B-1) 100 MG tablet Take 1 tablet (100 mg total) by mouth daily. 30 tablet 0 12/28/2014 at Unknown time  . traZODone (DESYREL) 25 mg TABS tablet Take 0.5 tablets (25 mg total) by mouth at bedtime as needed for sleep. 30 tablet 0 Past Week at Unknown time  . venlafaxine (EFFEXOR) 25 MG tablet Take 1 tablet (25 mg total) by mouth 2 (two) times  daily. 30 tablet 0 12/27/2014 at Unknown time  . Water For Irrigation, Sterile (FREE WATER) SOLN Place 200 mLs into feeding tube every 8 (eight) hours.   12/27/2014 at Unknown time  . HYDROmorphone (DILAUDID) 2 MG tablet Take 0.5 tablets (1 mg total) by mouth every 6 (six) hours as needed for moderate pain or severe pain. 10 tablet 0   . pantoprazole (PROTONIX) 20  MG tablet Take 2 tablets (40 mg total) by mouth daily. 30 tablet 1    Scheduled Meds: . aspirin EC  81 mg Oral Daily  . ciprofloxacin  500 mg Oral BID  . enoxaparin (LOVENOX) injection  30 mg Subcutaneous Q24H  . feeding supplement (RESOURCE BREEZE)  1 Container Oral TID BM  . folic acid  1 mg Oral Daily  . gabapentin  800 mg Oral TID  . levothyroxine  25 mcg Oral QAC breakfast  . metroNIDAZOLE  500 mg Oral 3 times per day  . pantoprazole  40 mg Oral Daily  . rosuvastatin  20 mg Oral Daily  . sodium chloride  3 mL Intravenous Q12H  . sucralfate  1 g Oral BID  . sulfaSALAzine  1,000 mg Oral Daily  . thiamine  100 mg Oral Daily  . venlafaxine  25 mg Oral BID   Continuous Infusions:   PRN Meds:.clonazePAM, HYDROmorphone, meclizine, ondansetron, traZODone Assessment/Plan: Principal Problem:   Diarrhea Active Problems:   DM2 (diabetes mellitus, type 2)   Anxiety state   Crohn's disease   Metabolic acidosis   Hypokalemia   Leukocytosis   Nicotine abuse   Heme positive stool   Pulmonary embolus   Dehydration   Hypotension   Hepatic steatosis   Elevated troponin   Gastroenteritis   Acute kidney injury   Malnutrition of moderate degree  Acute on Chronic Diarrhea- Pt reports symptoms improved.  Spoke with Dr. Madilyn Fireman and he is planning on colonoscopy on Sunday with clear liquid diet tomorrow. -continue abx -clear liquid diet tomorrow -colonoscopy planned for Sunday   UTI- Pt asymptomatic; denies dysuria or increased frequency.  -continue cipro  Hypokalemia- Resolved with KCL.   AKI- Resolving, mild non-anion  gap metabolic acidosis.    Elevated Troponin- Troponin 0.13>0.25>0.17>0.15. Cardiology signed-off; no chest pain.  TTE wnl.  -Continue rosuvastatin 20 mg daily -ASA 81 mg daily -Monitor on telemetry  History of Acute Bilateral PE- Acute nonobstructive bilateral PE noted on CTA 05/08/2014. Patient was on Eliquis 5 mg BID at home. Unclear if provoked or unprovoked given that she reports being in a similar condition prior to that admission with no family history or personal history of blood clots.  -Lovenox SQ QD for DVT/PE ppx  Chronic Pain/Anxiety- Per [facility] database, she last received Valium 5 mg 90 tablets on 12/13/14 from her PCP, Dilaudid 4 mg po TID prn 42 tablets by Dr. Lunette Stands on 12/02/14, Vicodin 10/325 mg  60 tablets by PA Margart Sickles on 11/18/14. Other psychiatric medications that she has include trazodone 25 mg at bedtime as needed for sleep, Effexor 25 mg twice daily. -Continue Trazodone 25 mg QHS -Continue Effexor 25 mg BID -Dilaudid 2 mg po Q6H prn  -Klonopin 1 mg BID prn  -Gabapentin 800 mg TID  FEN- -Diet: Regular, clears tomorrow   DVT prophylaxis- Lovenox 30 mg SQ QD  CODE STATUS- FULL CODE -Defer to her son [facility] [phone] if patients lacks decision-making capacity -Confirmed with patient on admission  Dispo: Disposition is deferred at this time, awaiting improvement of current medical problems.  Anticipated discharge in approximately 1-2 day(s).   The patient does have a current PCP (Ibethal Georgette Dover, MD) and does need an [facility] hospital follow-up appointment after discharge.  Boykin Peek, MD PGY-2   LOS: 2 days  12/31/2014 11:56 AM

## 2014-12-31 NOTE — Progress Notes (Signed)
Patient seen and examined. Case d/w residents in detail on morning rounds. I agree with findings and plan as outlined in Dr. Shiela Mayer note.  Patient's diarrhea is slowly improving. She is cleared for colonoscopy from cardio and needs no further cardiac w/u. Her C diff is negative and her stool cx is negative till date. She is scheduled for a colonoscopy in 2-3 days to further evaluate her diarrhea (possible Crohn's). Start clear liquid diet in AM. C/w cipro and flagyl for now.  Patient with possible UTI with enterobacter sensitive to cipro. C/w abx  She is somewhat agitated regarding the dosing of her pain medications. We will clarify home dose prior to increasing meds.  Patient will also need PT eval and possible rehab placement.

## 2014-12-31 NOTE — Progress Notes (Signed)
[facility] Progress Note  Subjective: Still complaining of intermittent diarrhea and occasional abdominal pain but no nausea or vomiting  Objective: Vital signs in last 24 hours: Temp:  [98.5 F (36.9 C)-98.6 F (37 C)] 98.6 F (37 C) (04/01 0507) Pulse Rate:  [85-87] 87 (04/01 0507) Resp:  [18] 18 (04/01 0507) BP: (86-100)/(56-78) 100/78 mmHg (04/01 0507) SpO2:  [99 %] 99 % (04/01 0507) Weight:  [44.1 kg (97 lb 3.6 oz)] 44.1 kg (97 lb 3.6 oz) (04/01 0507) Weight change: 1.281 kg (2 lb 13.2 oz)   PE: Unchanged  Lab Results: Results for orders placed or performed during the hospital encounter of 12/28/14 (from the past 24 hour(s))  CBC     Status: Abnormal   Collection Time: 12/30/14  1:04 PM  Result Value Ref Range   WBC 9.7 4.0 - 10.5 K/uL   RBC 3.27 (L) 3.87 - 5.11 MIL/uL   Hemoglobin 9.2 (L) 12.0 - 15.0 g/dL   HCT 09.8 (L) 11.9 - 14.7 %   MCV 91.4 78.0 - 100.0 fL   MCH 28.1 26.0 - 34.0 pg   MCHC 30.8 30.0 - 36.0 g/dL   RDW 82.9 (H) 56.2 - 13.0 %   Platelets 234 150 - 400 K/uL  Comprehensive metabolic panel     Status: Abnormal   Collection Time: 12/30/14  3:50 PM  Result Value Ref Range   Sodium 147 (H) 135 - 145 mmol/L   Potassium 3.9 3.5 - 5.1 mmol/L   Chloride 124 (H) 96 - 112 mmol/L   CO2 16 (L) 19 - 32 mmol/L   Glucose, Bld 131 (H) 70 - 99 mg/dL   BUN <5 (L) 6 - 23 mg/dL   Creatinine, Ser 8.65 0.50 - 1.10 mg/dL   Calcium 7.1 (L) 8.4 - 10.5 mg/dL   Total Protein 4.4 (L) 6.0 - 8.3 g/dL   Albumin 1.8 (L) 3.5 - 5.2 g/dL   AST 784 (H) 0 - 37 U/L   ALT 32 0 - 35 U/L   Alkaline Phosphatase 119 (H) 39 - 117 U/L   Total Bilirubin 0.4 0.3 - 1.2 mg/dL   GFR calc non Af Amer >90 >90 mL/min   GFR calc Af Amer >90 >90 mL/min   Anion gap 7 5 - 15  CBC     Status: Abnormal   Collection Time: 12/31/14  4:18 AM  Result Value Ref Range   WBC 11.2 (H) 4.0 - 10.5 K/uL   RBC 3.33 (L) 3.87 - 5.11 MIL/uL   Hemoglobin 9.4 (L) 12.0 - 15.0 g/dL   HCT 69.6 (L)  29.5 - 46.0 %   MCV 91.3 78.0 - 100.0 fL   MCH 28.2 26.0 - 34.0 pg   MCHC 30.9 30.0 - 36.0 g/dL   RDW 28.4 (H) 13.2 - 44.0 %   Platelets 318 150 - 400 K/uL  Basic metabolic panel     Status: Abnormal   Collection Time: 12/31/14  4:18 AM  Result Value Ref Range   Sodium 144 135 - 145 mmol/L   Potassium 3.7 3.5 - 5.1 mmol/L   Chloride 125 (H) 96 - 112 mmol/L   CO2 15 (L) 19 - 32 mmol/L   Glucose, Bld 74 70 - 99 mg/dL   BUN <5 (L) 6 - 23 mg/dL   Creatinine, Ser 1.02 0.50 - 1.10 mg/dL   Calcium 7.1 (L) 8.4 - 10.5 mg/dL   GFR calc non Af Amer >90 >90 mL/min   GFR calc Af Amer >  90 >90 mL/min   Anion gap 4 (L) 5 - 15    Studies/Results: No results found.    Assessment: Continued diarrhea, questionable prior diagnosis of Crohn's disease, presenting with dehydration and also elevated troponins. Colonoscopy had been planned prior to admission  Plan: Await cardiology's clearance and will need colonoscopy at some time. She was sent to set this up for tomorrow stating she was still feeling too weak area and we'll put her on clear liquid diet tomorrow in anticipation of colonoscopy in the next 2 or 3 days. Also awaiting tissue transglutaminase and remaining stool cultures. C. difficile is negative.    Kadarious Dikes C 12/31/2014, 8:07 AM

## 2015-01-01 LAB — CBC WITH DIFFERENTIAL/PLATELET
Basophils Absolute: 0.1 10*3/uL (ref 0.0–0.1)
Basophils Relative: 1 % (ref 0–1)
Eosinophils Absolute: 0.2 10*3/uL (ref 0.0–0.7)
Eosinophils Relative: 1 % (ref 0–5)
HEMATOCRIT: 31 % — AB (ref 36.0–46.0)
Hemoglobin: 9.6 g/dL — ABNORMAL LOW (ref 12.0–15.0)
Lymphocytes Relative: 29 % (ref 12–46)
Lymphs Abs: 3.3 10*3/uL (ref 0.7–4.0)
MCH: 28.4 pg (ref 26.0–34.0)
MCHC: 31 g/dL (ref 30.0–36.0)
MCV: 91.7 fL (ref 78.0–100.0)
MONOS PCT: 8 % (ref 3–12)
Monocytes Absolute: 0.9 10*3/uL (ref 0.1–1.0)
NEUTROS ABS: 6.8 10*3/uL (ref 1.7–7.7)
NEUTROS PCT: 61 % (ref 43–77)
Platelets: 240 10*3/uL (ref 150–400)
RBC: 3.38 MIL/uL — ABNORMAL LOW (ref 3.87–5.11)
RDW: 16.6 % — ABNORMAL HIGH (ref 11.5–15.5)
WBC: 11.3 10*3/uL — ABNORMAL HIGH (ref 4.0–10.5)

## 2015-01-01 LAB — COMPREHENSIVE METABOLIC PANEL
ALT: 34 U/L (ref 0–35)
AST: 86 U/L — ABNORMAL HIGH (ref 0–37)
Albumin: 1.9 g/dL — ABNORMAL LOW (ref 3.5–5.2)
Alkaline Phosphatase: 114 U/L (ref 39–117)
Anion gap: 5 (ref 5–15)
BILIRUBIN TOTAL: 0.3 mg/dL (ref 0.3–1.2)
CHLORIDE: 119 mmol/L — AB (ref 96–112)
CO2: 17 mmol/L — ABNORMAL LOW (ref 19–32)
Calcium: 7.4 mg/dL — ABNORMAL LOW (ref 8.4–10.5)
Creatinine, Ser: 0.62 mg/dL (ref 0.50–1.10)
GFR calc Af Amer: >90 mL/min (ref >90)
Glucose, Bld: 94 mg/dL (ref 70–99)
POTASSIUM: 2.9 mmol/L — AB (ref 3.5–5.1)
Sodium: 141 mmol/L (ref 135–145)
Total Protein: 4.7 g/dL — ABNORMAL LOW (ref 6.0–8.3)

## 2015-01-01 MED ORDER — POTASSIUM CHLORIDE 10 MEQ/100ML IV SOLN
10.0000 meq | INTRAVENOUS | Status: AC
  Administered 2015-01-01 (×3): 10 meq via INTRAVENOUS
  Filled 2015-01-01 (×3): qty 100

## 2015-01-01 NOTE — Progress Notes (Addendum)
Subjective:    Currently, the patient reports improvement of her diarrhea and lightheadedness. She says that she had 2 loose bowel movements yesterday and one bowel movement last night. She continues to have diffuse abdominal pain, which she says is positional. She says that she will not be able to undergo colonoscopy tomorrow because she does not think she'll be able to tolerate it due to pain in her perianal area. However, she does not want to go home and have her colonoscopy as an outpatient.  Interval Events: -Vital signs stable overnight.  -On clear liquid diet for planned colonoscopy tomorrow.  -Unable to be seen by PT due to low potassium this morning.    Objective:    Vital Signs:   Temp:  [97.9 F (36.6 C)-98.5 F (36.9 C)] 98.5 F (36.9 C) (04/02 0441) Pulse Rate:  [76-98] 81 (04/02 0441) Resp:  [18] 18 (04/02 0441) BP: (102-110)/(64-78) 102/64 mmHg (04/02 0441) SpO2:  [100 %] 100 % (04/02 0441) Weight:  [106 lb 14.8 oz (48.5 kg)] 106 lb 14.8 oz (48.5 kg) (04/02 0451) Last BM Date: 01/01/15  24-hour weight change: Weight change: 9 lb 11.2 oz (4.4 kg)  Intake/Output:  No intake or output data in the 24 hours ending 01/01/15 0816    Physical Exam: General: Thin, cachectic appearing, in NAD.  Lungs:  Normal respiratory effort. Clear to auscultation BL without crackles or wheezes.  Heart: RRR. S1 and S2 normal without gallop, murmur, or rubs.  Abdomen:  BS normoactive. Soft, Nondistended. Mild diffuse tenderness to palpation.  Extremities: No pretibial edema.     Labs:  Basic Metabolic Panel:  Recent Labs Lab 12/28/14 1905 12/29/14 0225 12/30/14 1550 12/31/14 0418 01/01/15 0439  NA 139 142 147* 144 141  K 3.1* 2.7* 3.9 3.7 2.9*  CL 102 114* 124* 125* 119*  CO2 15* 15* 16* 15* 17*  GLUCOSE 97 81 131* 74 94  BUN 9 5* <5* <5* <5*  CREATININE 1.25* 1.03 0.64 0.52 0.62  CALCIUM 7.4* 6.5* 7.1* 7.1* 7.4*  MG  --  1.3*  --   --   --     Liver Function  Tests:  Recent Labs Lab 12/28/14 1905 12/29/14 0225 12/30/14 1550 01/01/15 0439  AST 44* 50* 113* 86*  ALT 23 21 32 34  ALKPHOS 159* 142* 119* 114  BILITOT 0.9 1.2 0.4 0.3  PROT 5.7* 4.7* 4.4* 4.7*  ALBUMIN 2.3* 2.0* 1.8* 1.9*    Recent Labs Lab 12/28/14 1905  LIPASE 18   CBC:  Recent Labs Lab 12/28/14 1905 12/29/14 0225 12/29/14 0755 12/30/14 1304 12/31/14 0418 01/01/15 0439  WBC 12.7* 12.4* 13.0* 9.7 11.2* 11.3*  NEUTROABS 9.5*  --   --   --   --  6.8  HGB 11.9* 10.6* 10.6* 9.2* 9.4* 9.6*  HCT 38.5 34.2* 35.0* 29.9* 30.4* 31.0*  MCV 92.1 91.4 92.8 91.4 91.3 91.7  PLT 307 267 263 234 318 240    Cardiac Enzymes:  Recent Labs Lab 12/28/14 1905 12/29/14 0220 12/29/14 0755 12/29/14 1253  TROPONINI 0.13* 0.25* 0.17* 0.15*    CBG:  Recent Labs Lab 12/28/14 1850  GLUCAP 119*    Microbiology: Results for orders placed or performed during the hospital encounter of 12/28/14  Culture, blood (routine x 2)     Status: None (Preliminary result)   Collection Time: 12/28/14 10:56 PM  Result Value Ref Range Status   Specimen Description BLOOD LEFT FOREARM  Final   Special Requests BOTTLES DRAWN  AEROBIC AND ANAEROBIC 5CC EA  Final   Culture   Final           BLOOD CULTURE RECEIVED NO GROWTH TO DATE CULTURE WILL BE HELD FOR 5 DAYS BEFORE ISSUING A FINAL NEGATIVE REPORT Performed at [facility]    Report Status PENDING  Incomplete  Culture, blood (routine x 2)     Status: None (Preliminary result)   Collection Time: 12/29/14 12:09 AM  Result Value Ref Range Status   Specimen Description BLOOD RIGHT WRIST  Final   Special Requests BOTTLES DRAWN AEROBIC ONLY 3CC  Final   Culture   Final           BLOOD CULTURE RECEIVED NO GROWTH TO DATE CULTURE WILL BE HELD FOR 5 DAYS BEFORE ISSUING A FINAL NEGATIVE REPORT Performed at [facility]    Report Status PENDING  Incomplete  Urine culture     Status: None   Collection Time: 12/29/14  2:54 AM    Result Value Ref Range Status   Specimen Description URINE, CATHETERIZED  Final   Special Requests NONE  Final   Colony Count   Final    >=100,000 COLONIES/ML Performed at [facility]    Culture   Final    ENTEROBACTER AEROGENES Performed at [facility]    Report Status 12/31/2014 FINAL  Final   Organism ID, Bacteria ENTEROBACTER AEROGENES  Final      Susceptibility   Enterobacter aerogenes - MIC*    CEFAZOLIN RESISTANT      CEFTRIAXONE <=1 SENSITIVE Sensitive     CIPROFLOXACIN <=0.25 SENSITIVE Sensitive     GENTAMICIN <=1 SENSITIVE Sensitive     LEVOFLOXACIN <=0.12 SENSITIVE Sensitive     NITROFURANTOIN 64 INTERMEDIATE Intermediate     TOBRAMYCIN <=1 SENSITIVE Sensitive     TRIMETH/SULFA <=20 SENSITIVE Sensitive     PIP/TAZO <=4 SENSITIVE Sensitive     * ENTEROBACTER AEROGENES  Clostridium Difficile by PCR     Status: None   Collection Time: 12/29/14  4:13 AM  Result Value Ref Range Status   C difficile by pcr NEGATIVE NEGATIVE Final  Stool culture     Status: None (Preliminary result)   Collection Time: 12/29/14  4:13 AM  Result Value Ref Range Status   Specimen Description STOOL  Final   Special Requests NONE  Final   Culture   Final    NO SUSPICIOUS COLONIES, CONTINUING TO HOLD Performed at [facility]    Report Status PENDING  Incomplete    Imaging: No results found.     Medications:    Infusions:    Scheduled Medications: . aspirin EC  81 mg Oral Daily  . ciprofloxacin  500 mg Oral BID  . enoxaparin (LOVENOX) injection  30 mg Subcutaneous Q24H  . feeding supplement (RESOURCE BREEZE)  1 Container Oral TID BM  . folic acid  1 mg Oral Daily  . gabapentin  800 mg Oral TID  . levothyroxine  25 mcg Oral QAC breakfast  . metroNIDAZOLE  500 mg Oral 3 times per day  . pantoprazole  40 mg Oral Daily  . potassium chloride  10 mEq Intravenous Q1 Hr x 3  . rosuvastatin  20 mg Oral Daily  . sodium chloride  3 mL Intravenous Q12H  .  sucralfate  1 g Oral BID  . sulfaSALAzine  1,000 mg Oral Daily  . thiamine  100 mg Oral Daily  . venlafaxine  25 mg Oral BID  PRN Medications: clonazePAM, HYDROmorphone, meclizine, ondansetron, traZODone   Assessment/ Plan:    Principal Problem:   Diarrhea Active Problems:   DM2 (diabetes mellitus, type 2)   Anxiety state   Crohn's disease   Metabolic acidosis   Hypokalemia   Leukocytosis   Nicotine abuse   Heme positive stool   Pulmonary embolus   Dehydration   Hypotension   Hepatic steatosis   Elevated troponin   Gastroenteritis   Acute kidney injury   Malnutrition of moderate degree  #Acute on chronic diarrhea The patient's symptoms have improved, and she is having minimal diarrhea currently. Plan was for colonoscopy tomorrow, but the patient does not feel she is ready. Physical therapy was unable to evaluate her this morning due to her low potassium. If she is unable to undergo colonoscopy tomorrow, she may be able to be discharged home with follow-up with GI as an outpatient for colonoscopy. This appears to be a chronic issue for her, and we're unlikely to find a solution during his hospitalization. GI pathogen panel negative. Tissue transglutaminase antibody negative. -Continue home sulfasalazine 1000 mg daily. -Continue Cipro and Flagyl. -Discuss with physical therapy to see if patient can be evaluated today. -Discuss possible outpatient colonoscopy with GI.  -Continue clear liquid diet in hopes that patient will be able to undergo colonoscopy tomorrow. -Follow-up final stool culture and blood cultures. -Zofran when necessary. -Continue Protonix 40 mg daily. -Continue sucralfate 1 g twice a day. -Continue meclizine 25 mg when necessary.  #Hypokalemia Due to chronic diarrhea. She is not receiving any potassium supplement over the last couple days. She reports tolerating IV potassium better than oral. -Potassium chloride 10 mEq IV 3. -Repeat BMP tomorrow  morning.  #Chronic pain and anxiety She continues to report pain despite being on a high dose of Dilaudid. Per her previous discharge summary, she was taking Dilaudid 1 mg every 6 hours. However, she reports that she takes opioids intermittently for flares of pain. It appears that she receives opioid prescriptions from multiple doctors. She is currently on a higher dose of Dilaudid than she was previously discharged on, and I do not feel that she would benefit from increasing her opioid dose. -Continue Dilaudid 2 mg by mouth every 6 hours as needed. -Continue clonazepam 1 mg by mouth twice a day when necessary. -Continue home trazodone, Effexor, and gabapentin.  #Anemia of chronic disease Low ferritin, but low TIBC in October 2015 with normal MCV currently. Hemoglobin stable. -Continue to monitor blood counts.  #Malnutrition -Continue feeding supplement. -Continue vitamin B-1 and folate.  #Positive urine culture Urine grew Enterobacter, but patient was asymptomatic at presentation. She has been receiving antibiotics for her diarrhea, which will also treat any urinary tract infection. Given the absence of symptoms will not continue any antibiotic treatment aimed at treating a urinary tract infection. -No treatment necessary.  #Acute kidney injury Resolved.  #History of bilateral PE Patient not on anti-coagulation as an outpatient, so we are treating this as if it was a provoked PE. -Lovenox for DVT prophylaxis.  #Hyperlipidemia -Continue home Crestor. -Continue home aspirin 81 mg daily.   DVT PPX - low molecular weight heparin  CODE STATUS - Full.  CONSULTS PLACED - Gastroenterology, cardiology.  DISPO - Possible discharge tomorrow pending planning for colonoscopy and resolution of hypokalemia.  The patient does have a current PCP (Shamleffer, Madelin Rear, MD) and does not need an [facility] hospital follow-up appointment after discharge.    Is the [facility] hospital follow-up appointment a  one-time only appointment? not applicable.  Does the patient have transportation limitations that hinder transportation to clinic appointments? yes   SERVICE NEEDED AT DISCHARGE - TO BE DETERMINED DURING HOSPITAL COURSE         Y = Yes, Blank = No PT:   OT:   RN:   Equipment:   Other:      Length of Stay: 3 day(s)   Signed: Donavan Foil, MD  PGY-1, Internal Medicine Resident Pager: [phone] (7AM-5PM) 01/01/2015, 8:16 AM

## 2015-01-01 NOTE — Evaluation (Signed)
Physical Therapy Evaluation Patient Details Name: TIASHA HELVIE MRN: [id number] DOB: June 29, 1961 Today's Date: 01/01/2015   History of Present Illness  54 y.o. female admitted with Acute on chronic diarrhea  Clinical Impression  Pt admitted with above complictaion. Limited assessment today due to patient participation. Patient was witnessed by PT, standing and ambulating with nurse tech immediately prior to PT evaluation. Upon our initial evaluation the patient demonstrated significant balance deficits in standing which was quite inconsistent with previous functional ability just moments prior. Muscle strength adequate enough to stand however minimal activation when strength assessed in bed. RN notified of limited evaluation and patient's change in functional ability upon assessment. VSS throughout. Mrs. Haden has limited family support at home. Will continue to follow and progress as able.        Follow Up Recommendations SNF (likely to refuse, if so, HHPT)    Equipment Recommendations  None recommended by PT    Recommendations for Other Services       Precautions / Restrictions Precautions Precautions: Fall Restrictions Weight Bearing Restrictions: No      Mobility  Bed Mobility Overal bed mobility: Needs Assistance Bed Mobility: Supine to Sit;Sit to Supine     Supine to sit: Modified independent (Device/Increase time);HOB elevated Sit to supine: Min assist   General bed mobility comments: Min assist for LE support into bed. Cues for technique however not responding to instructions, and just wanted PT to lift LEs into bed.  Transfers Overall transfer level: Needs assistance Equipment used: 2 person hand held assist Transfers: Sit to/from Stand Sit to Stand: Min assist         General transfer comment: Min assist to rise from lowest bed setting with +2 hand held assist. Cues for hand placement and to stand upright. Pt began to shake and sway heavily despite cues and  increased support. Required to sit back on bed. States she feels nauseous.  Ambulation/Gait                Stairs            Wheelchair Mobility    Modified Rankin (Stroke Patients Only)       Balance Overall balance assessment: Needs assistance;History of Falls Sitting-balance support: No upper extremity supported;Feet supported Sitting balance-Leahy Scale: Good     Standing balance support: Single extremity supported Standing balance-Leahy Scale: Poor                               Pertinent Vitals/Pain Pain Assessment: 0-10 Pain Score:  ("my back hurts" no value given) Pain Location: back Pain Descriptors / Indicators:  ("hurts") Pain Intervention(s): Limited activity within patient's tolerance;Repositioned;Monitored during session    Home Living Family/patient expects to be discharged to:: Private residence Living Arrangements: Alone Available Help at Discharge: Family;Available PRN/intermittently;Personal care attendant [facility] aide daily for several hours) Type of Home: Apartment Home Access: Stairs to enter Entrance Stairs-Rails: Right;Left Entrance Stairs-Number of Steps: 6 steps with L rail to landing, 6 steps with R rail to apartment Home Layout: One level Home Equipment: Environmental consultant - 2 wheels;Cane - single point;Bedside commode;Shower seat      Prior Function Level of Independence: Independent with assistive device(s)         Comments: using RW and cane for mobility at home     Hand Dominance   Dominant Hand: Right    Extremity/Trunk Assessment   Upper Extremity Assessment: Defer to OT evaluation  Lower Extremity Assessment: Generalized weakness         Communication   Communication: No difficulties  Cognition Arousal/Alertness: Awake/alert Behavior During Therapy: WFL for tasks assessed/performed Overall Cognitive Status: Within Functional Limits for tasks assessed                      General  Comments General comments (skin integrity, edema, etc.): Inconsistencies noted with functional abilites. Pt stood and ambulated to [facility] with nurse tech just prior to PT entering room. This was witnessed by PT. Demonstrated minimal balance deficits with this task. When PT began evaluation only a few minutes later pt demonstrated significant deficits in her balance and was unable to ambulate. VSS with SpO2 in low 90s on room air. Attempted therapeutic exercises however pt states she is too weak to move and perform bed level exercises despite being able to stand.    Exercises        Assessment/Plan    PT Assessment Patient needs continued PT services  PT Diagnosis Difficulty walking;Generalized weakness   PT Problem List Decreased strength;Decreased activity tolerance;Decreased balance;Decreased mobility;Decreased knowledge of use of DME;Pain  PT Treatment Interventions DME instruction;Gait training;Stair training;Functional mobility training;Therapeutic activities;Therapeutic exercise;Balance training;Neuromuscular re-education;Patient/family education;Modalities   PT Goals (Current goals can be found in the Care Plan section) Acute Rehab PT Goals Patient Stated Goal: Stay in the hospital PT Goal Formulation: With patient Time For Goal Achievement: 01/15/15 Potential to Achieve Goals: Fair    Frequency Min 3X/week   Barriers to discharge Decreased caregiver support lives alone    Co-evaluation               End of Session Equipment Utilized During Treatment: Gait belt Activity Tolerance: Other (comment) (Self limiting. Does not wish to ambulate or exercise) Patient left: in bed;with call bell/phone within reach;with family/visitor present Nurse Communication: Mobility status;Other (comment);Precautions (Incosistencies with functional mobility between staff)         Time: 9604-5409 PT Time Calculation (min) (ACUTE ONLY): 24 min   Charges:   PT Evaluation $Initial PT  Evaluation Tier I: 1 Procedure PT Treatments $Therapeutic Activity: 8-22 mins   PT G Codes:        [name] 01/01/2015, 1:37 PM [place], [place] [phone]

## 2015-01-01 NOTE — Progress Notes (Signed)
The patient was seen today and told me that she did not want to have a colonoscopy tomorrow. She may not even want one on Monday. She wants to eat and see how she does. We talked about this and I told her I had no problems with giving her a regular diet which she wants today. She said that Dr. Ewing Schlein was planning to do a colonoscopy at some point in the near future because it was time for her to have one. I think if she is stable and tolerates her diet she could go home and have a colonoscopy done as an outpatient.

## 2015-01-01 NOTE — Progress Notes (Signed)
PT Cancellation Note  Patient Details Name: [name] MRN: [id number] DOB: July 05, 1961   Cancelled Treatment:    Reason Eval/Treat Not Completed: Medical issues which prohibited therapy Patient's most recent lab report indicates potassium of 2.9 - Will hold formal PT evaluation at this time per departmental guidelines. Follow up when pt is medically appropriate.  [name] 01/01/2015, 8:07 AM Charlsie Merles, PT [phone]

## 2015-01-02 DIAGNOSIS — D631 Anemia in chronic kidney disease: Secondary | ICD-10-CM

## 2015-01-02 DIAGNOSIS — E785 Hyperlipidemia, unspecified: Secondary | ICD-10-CM

## 2015-01-02 LAB — BASIC METABOLIC PANEL
ANION GAP: 7 (ref 5–15)
Anion gap: 7 (ref 5–15)
BUN: <5 mg/dL — ABNORMAL LOW (ref 6–23)
CHLORIDE: 117 mmol/L — AB (ref 96–112)
CHLORIDE: 111 mmol/L (ref 96–112)
CO2: 19 mmol/L (ref 19–32)
CO2: 22 mmol/L (ref 19–32)
Calcium: 7.2 mg/dL — ABNORMAL LOW (ref 8.4–10.5)
Calcium: 7.2 mg/dL — ABNORMAL LOW (ref 8.4–10.5)
Creatinine, Ser: 0.54 mg/dL (ref 0.50–1.10)
Creatinine, Ser: 0.56 mg/dL (ref 0.50–1.10)
GFR calc Af Amer: >90 mL/min (ref >90)
GFR calc non Af Amer: >90 mL/min (ref >90)
GLUCOSE: 140 mg/dL — AB (ref 70–99)
Glucose, Bld: 72 mg/dL (ref 70–99)
Potassium: 2.8 mmol/L — ABNORMAL LOW (ref 3.5–5.1)
Potassium: 3.4 mmol/L — ABNORMAL LOW (ref 3.5–5.1)
Sodium: 143 mmol/L (ref 135–145)
Sodium: 140 mmol/L (ref 135–145)

## 2015-01-02 LAB — STOOL CULTURE

## 2015-01-02 LAB — MAGNESIUM
Magnesium: 1.1 mg/dL — ABNORMAL LOW (ref 1.5–2.5)
Magnesium: 2.6 mg/dL — ABNORMAL HIGH (ref 1.5–2.5)

## 2015-01-02 MED ORDER — POTASSIUM CHLORIDE 10 MEQ/100ML IV SOLN
10.0000 meq | INTRAVENOUS | Status: DC
  Administered 2015-01-02 (×3): 10 meq via INTRAVENOUS
  Filled 2015-01-02 (×4): qty 100

## 2015-01-02 MED ORDER — MAGNESIUM SULFATE 4 GM/100ML IV SOLN
4.0000 g | Freq: Once | INTRAVENOUS | Status: AC
  Administered 2015-01-02: 4 g via INTRAVENOUS
  Filled 2015-01-02: qty 100

## 2015-01-02 MED ORDER — POTASSIUM CHLORIDE 10 MEQ/100ML IV SOLN
10.0000 meq | INTRAVENOUS | Status: AC
  Administered 2015-01-02 (×2): 10 meq via INTRAVENOUS
  Filled 2015-01-02 (×2): qty 100

## 2015-01-02 NOTE — Progress Notes (Signed)
Patient seen and examined. Case d/w residents in detail on morning rounds. I agree with findings and plan as outlined in Dr. Francella Solian note.  Patient did complain of 1 episode of diarrhea this morning but tolerating PO intake. Patient does continue to refuse colonoscopy at current time and per GI she is ok to have this done as an outpatient. Would d/c enteric precautions given negative C diff. Would d/c cipro and flagyl on d/c. Cultures remain negative. C/w sulfasalazine and sucralfate.   Patient was evaluated by PT and was recommended SNF placement which she refuses. We discussed d/c plan with her and she became aggressive stating that she does not want to go home today and will go home tomorrow as she will need to make arrangements to go home. We offered her help to get these arrangements done and she refused saying she knows what she needs to get done and will not ho home till tomorrow.   Would supplement K and Mag today

## 2015-01-02 NOTE — Progress Notes (Signed)
Subjective:    The patient reports improvement of her diarrhea. She had no diarrhea yesterday or last night, but she had one episode this morning. She denies any dizziness, lightheadedness, or shortness of breath currently. She states adamantly that she is unable to go home today because she thinks she will not be able to complete the planning necessary to make the transition. She continues to refuse a colonoscopy currently. She refuses placement in a skilled nursing facility.  Interval Events: -Vital signs stable overnight.  -Switched to regular diet. -Seen by physical therapy who recommended a skilled nursing facility or home health PT.   Objective:    Vital Signs:   Temp:  [98.2 F (36.8 C)-98.5 F (36.9 C)] 98.5 F (36.9 C) (04/03 0427) Pulse Rate:  [79-89] 81 (04/03 0427) Resp:  [16-18] 16 (04/03 0427) BP: (103-134)/(77-93) 103/77 mmHg (04/03 0427) SpO2:  [100 %] 100 % (04/03 0427) Weight:  [94 lb 5.7 oz (42.8 kg)] 94 lb 5.7 oz (42.8 kg) (04/03 0427) Last BM Date: 01/01/15  24-hour weight change: Weight change: -12 lb 9.1 oz (-5.7 kg)  Intake/Output:   Intake/Output Summary (Last 24 hours) at 01/02/15 0952 Last data filed at 01/02/15 0553  Gross per 24 hour  Intake    120 ml  Output    900 ml  Net   -780 ml      Physical Exam: General: Thin, cachectic appearing, in NAD.  Lungs:  Normal respiratory effort. Clear to auscultation BL without crackles or wheezes.  Heart: RRR. S1 and S2 normal without gallop, murmur, or rubs.  Abdomen:  BS normoactive. Soft, Nondistended. Mild diffuse tenderness to palpation.  Extremities: No pretibial edema. Thin legs with mild pedal swelling bilaterally      Labs:  Basic Metabolic Panel:  Recent Labs Lab 12/29/14 0225 12/30/14 1550 12/31/14 0418 01/01/15 0439 01/02/15 0538  NA 142 147* 144 141 143  K 2.7* 3.9 3.7 2.9* 2.8*  CL 114* 124* 125* 119* 117*  CO2 15* 16* 15* 17* 19  GLUCOSE 81 131* 74 94 72  BUN 5* <5* <5*  <5* <5*  CREATININE 1.03 0.64 0.52 0.62 0.54  CALCIUM 6.5* 7.1* 7.1* 7.4* 7.2*  MG 1.3*  --   --   --  1.1*    Liver Function Tests:  Recent Labs Lab 12/28/14 1905 12/29/14 0225 12/30/14 1550 01/01/15 0439  AST 44* 50* 113* 86*  ALT 23 21 32 34  ALKPHOS 159* 142* 119* 114  BILITOT 0.9 1.2 0.4 0.3  PROT 5.7* 4.7* 4.4* 4.7*  ALBUMIN 2.3* 2.0* 1.8* 1.9*    Recent Labs Lab 12/28/14 1905  LIPASE 18   CBC:  Recent Labs Lab 12/28/14 1905 12/29/14 0225 12/29/14 0755 12/30/14 1304 12/31/14 0418 01/01/15 0439  WBC 12.7* 12.4* 13.0* 9.7 11.2* 11.3*  NEUTROABS 9.5*  --   --   --   --  6.8  HGB 11.9* 10.6* 10.6* 9.2* 9.4* 9.6*  HCT 38.5 34.2* 35.0* 29.9* 30.4* 31.0*  MCV 92.1 91.4 92.8 91.4 91.3 91.7  PLT 307 267 263 234 318 240    Cardiac Enzymes:  Recent Labs Lab 12/28/14 1905 12/29/14 0220 12/29/14 0755 12/29/14 1253  TROPONINI 0.13* 0.25* 0.17* 0.15*    CBG:  Recent Labs Lab 12/28/14 1850  GLUCAP 119*    Microbiology: Results for orders placed or performed during the hospital encounter of 12/28/14  Culture, blood (routine x 2)     Status: None (Preliminary result)   Collection Time:  12/28/14 10:56 PM  Result Value Ref Range Status   Specimen Description BLOOD LEFT FOREARM  Final   Special Requests BOTTLES DRAWN AEROBIC AND ANAEROBIC 5CC EA  Final   Culture   Final           BLOOD CULTURE RECEIVED NO GROWTH TO DATE CULTURE WILL BE HELD FOR 5 DAYS BEFORE ISSUING A FINAL NEGATIVE REPORT Performed at [facility]    Report Status PENDING  Incomplete  Culture, blood (routine x 2)     Status: None (Preliminary result)   Collection Time: 12/29/14 12:09 AM  Result Value Ref Range Status   Specimen Description BLOOD RIGHT WRIST  Final   Special Requests BOTTLES DRAWN AEROBIC ONLY 3CC  Final   Culture   Final           BLOOD CULTURE RECEIVED NO GROWTH TO DATE CULTURE WILL BE HELD FOR 5 DAYS BEFORE ISSUING A FINAL NEGATIVE REPORT Performed at  [facility]    Report Status PENDING  Incomplete  Urine culture     Status: None   Collection Time: 12/29/14  2:54 AM  Result Value Ref Range Status   Specimen Description URINE, CATHETERIZED  Final   Special Requests NONE  Final   Colony Count   Final    >=100,000 COLONIES/ML Performed at [facility]    Culture   Final    ENTEROBACTER AEROGENES Performed at [facility]    Report Status 12/31/2014 FINAL  Final   Organism ID, Bacteria ENTEROBACTER AEROGENES  Final      Susceptibility   Enterobacter aerogenes - MIC*    CEFAZOLIN RESISTANT      CEFTRIAXONE <=1 SENSITIVE Sensitive     CIPROFLOXACIN <=0.25 SENSITIVE Sensitive     GENTAMICIN <=1 SENSITIVE Sensitive     LEVOFLOXACIN <=0.12 SENSITIVE Sensitive     NITROFURANTOIN 64 INTERMEDIATE Intermediate     TOBRAMYCIN <=1 SENSITIVE Sensitive     TRIMETH/SULFA <=20 SENSITIVE Sensitive     PIP/TAZO <=4 SENSITIVE Sensitive     * ENTEROBACTER AEROGENES  Clostridium Difficile by PCR     Status: None   Collection Time: 12/29/14  4:13 AM  Result Value Ref Range Status   C difficile by pcr NEGATIVE NEGATIVE Final  Stool culture     Status: None (Preliminary result)   Collection Time: 12/29/14  4:13 AM  Result Value Ref Range Status   Specimen Description STOOL  Final   Special Requests NONE  Final   Culture   Final    NO SUSPICIOUS COLONIES, CONTINUING TO HOLD Performed at [facility]    Report Status PENDING  Incomplete    Imaging: No results found.     Medications:    Infusions:    Scheduled Medications: . aspirin EC  81 mg Oral Daily  . ciprofloxacin  500 mg Oral BID  . enoxaparin (LOVENOX) injection  30 mg Subcutaneous Q24H  . feeding supplement (RESOURCE BREEZE)  1 Container Oral TID BM  . folic acid  1 mg Oral Daily  . gabapentin  800 mg Oral TID  . levothyroxine  25 mcg Oral QAC breakfast  . magnesium sulfate 1 - 4 g bolus IVPB  4 g Intravenous Once  . metroNIDAZOLE  500  mg Oral 3 times per day  . pantoprazole  40 mg Oral Daily  . potassium chloride  10 mEq Intravenous Q1 Hr x 4  . rosuvastatin  20 mg Oral Daily  . sodium  chloride  3 mL Intravenous Q12H  . sucralfate  1 g Oral BID  . sulfaSALAzine  1,000 mg Oral Daily  . thiamine  100 mg Oral Daily  . venlafaxine  25 mg Oral BID    PRN Medications: clonazePAM, HYDROmorphone, meclizine, ondansetron, traZODone   Assessment/ Plan:    Principal Problem:   Diarrhea Active Problems:   DM2 (diabetes mellitus, type 2)   Anxiety state   Crohn's disease   Metabolic acidosis   Hypokalemia   Leukocytosis   Nicotine abuse   Heme positive stool   Pulmonary embolus   Dehydration   Hypotension   Hepatic steatosis   Elevated troponin   Gastroenteritis   Acute kidney injury   Malnutrition of moderate degree  #Acute on chronic diarrhea Her symptoms continue to be improved, but she does continue to have diarrhea. She refuses a colonoscopy which would be the next step to evaluate the cause of her diarrhea. She is tolerating all of her medicine orally, and she can be managed as an outpatient where she can undergo a colonoscopy at her convenience. -Stop enteric precautions. -Continue home sulfasalazine 1000 mg daily. -Continue Cipro and Flagyl, day 5. This can be discontinued at discharge. -We will schedule follow-up with GI. -Regular diet. -Follow-up final stool culture and blood cultures. -Zofran when necessary. -Continue Protonix 40 mg daily. -Continue sucralfate 1 g twice a day. -Continue meclizine 25 mg when necessary. -Home health PT.   #Hypokalemia Potassium remains low this morning with hypomagnesemia. -Magnesium sulfate 4 g IV. -Potassium chloride 10 mEq IV 4. -Repeat BMP after supplementation.  #Chronic pain and anxiety Pain controlled currently. She appears to be at her baseline, but she is quite argumentative. -Continue Dilaudid 2 mg by mouth every 6 hours as needed. -Continue  clonazepam 1 mg by mouth twice a day when necessary. -Continue home trazodone, Effexor, and gabapentin.  #Anemia of chronic disease Hemoglobin stable. -Continue to monitor blood counts.  #Malnutrition -Continue feeding supplement. -Continue vitamin B-1 and folate.  #History of bilateral PE -Lovenox for DVT prophylaxis.   #Hyperlipidemia -Continue home Crestor. -Continue home aspirin 81 mg daily.   DVT PPX - low molecular weight heparin  CODE STATUS - Full.  CONSULTS PLACED - Gastroenterology, cardiology.  DISPO - Discharge tomorrow per patient preference.  The patient does have a current PCP (Shamleffer, Madelin Rear, MD) and does not need an [facility] hospital follow-up appointment after discharge.    Is the [facility] hospital follow-up appointment a one-time only appointment? not applicable.  Does the patient have transportation limitations that hinder transportation to clinic appointments? yes   SERVICE NEEDED AT DISCHARGE - TO BE DETERMINED DURING HOSPITAL COURSE         Y = Yes, Blank = No PT: SNF (refused, so HH PT)  OT:   RN:   Equipment:   Other:      Length of Stay: 4 day(s)   Signed: Donavan Foil, MD  PGY-1, Internal Medicine Resident Pager: [phone] (7AM-5PM) 01/02/2015, 9:52 AM

## 2015-01-02 NOTE — Progress Notes (Addendum)
CSW (Clinical Child psychotherapist) visited pt room to discuss SNF and transportation needs. Pt informed CSW she will not go to a SNF and will dc home instead. Pt lives with her son. Pt son also present at bedside and in agreement to what pt wants. Pt informed CSW she has transportation issues and does not like Medicaid or [employer] transport vehicles. She informed CSW they are too bumpy and hurt her back. CSW informed pt that CSW unaware of other resources but CSW will provide pt with transportation packet to review. CSW suggested that pt reconsider Medicaid or [employer] transportation. Pt unwilling to do so at this time. Pt was accepting of transportation packet. Pt reported that medication concerns were in regards to prescription being written by MD. She stated that she has no issues getting the medications once ordered. Pt had no other social work concerns, but she did mention multiple medical concerns. CSW advised she speak with MD about these issues and she informed CSW she plans to do so.  At this time, pt has no further hospital social work needs. CSW signing off.  Joe Tanney Lajean [employer] Weekend CSW [phone]

## 2015-01-02 NOTE — Progress Notes (Signed)
The patient states that she does not want to have a colonoscopy until at least Tuesday. She said she was told that she didn't need to stay in the hospital for that, which upsets her because she says we can't just kicked her out of the hospital. If she is still in the hospital I can plan to do a colonoscopy on Tuesday. I think she would do just fine also being discharged and having a colonoscopy done by her gastroenterologist Dr. Ewing Schlein as an outpatient. She seems to be manipulative and trying to dictate the timing of her care.

## 2015-01-03 ENCOUNTER — Telehealth: Payer: Self-pay

## 2015-01-03 MED ORDER — POTASSIUM CHLORIDE CRYS ER 20 MEQ PO TBCR: 40.0000 meq | EXTENDED_RELEASE_TABLET | Freq: Every day | ORAL | Status: DC

## 2015-01-03 MED ORDER — ASPIRIN 81 MG PO TBEC: 81.0000 mg | DELAYED_RELEASE_TABLET | Freq: Every day | ORAL | Status: DC

## 2015-01-03 NOTE — Progress Notes (Signed)
Patient seen but not examined. Case d/w residents in detail. I agree with plan as documented in Dr. Francella Solian note.  As was discussed with patient yesterday she is stable for d/c home. Resident spoke with GI and it was decided patient should have the colonoscopy done as an outpatient.   Her diarrhea has improved and she has completed a 5 day course of cipro and flagyl. C/w PPI and sucralfate. Blood cultures and stool cultures are negative till date.  Patinet does complain of left ankle pain and right sided pain which is chronic and was advised to follow up with her PCP for this pain.   She is upset and accused the team of deleting notes from the computer which we assured her we didn't. She also states she is upset with the care provided and that we didn't listen to her about her pain medications and that she wanted the interval shortened between the doses of her meds. We advised her to follow up with her PCP for pain medication dosing.   She is stable to go home today with outpatient follow up with PCP and GI

## 2015-01-03 NOTE — Progress Notes (Signed)
Subjective:    She reports feeling much better this morning. She was able to tolerate a regular diet without issue yesterday. She is not had any diarrhea since yesterday morning. She does report some burning in her IV site where she is receiving IV potassium. She reports discussing a possible colonoscopy with GI, but she has made the arrangements to go home today.  When she was informed that she was going home today with plans for an outpatient colonoscopy, she became very upset.  She stated that she has pain in her left ankle along with the right side of her body.  Interval Events: -Vital signs stable overnight.  -Repeat labs yesterday afternoon showed a potassium of 3.4, magnesium of 2.6. IV potassium chloride 2 was ordered.    Objective:    Vital Signs:   Temp:  [97.7 F (36.5 C)-99.7 F (37.6 C)] 98 F (36.7 C) (04/04 0504) Pulse Rate:  [82-87] 87 (04/04 0504) Resp:  [16-18] 18 (04/04 0504) BP: (107-111)/(65-84) 107/65 mmHg (04/04 0504) SpO2:  [100 %] 100 % (04/04 0504) Weight:  [93 lb 14.7 oz (42.6 kg)] 93 lb 14.7 oz (42.6 kg) (04/04 0504) Last BM Date: 01/02/15  24-hour weight change: Weight change: -7.1 oz (-0.2 kg)  Intake/Output:   Intake/Output Summary (Last 24 hours) at 01/03/15 0824 Last data filed at 01/03/15 0524  Gross per 24 hour  Intake    120 ml  Output    150 ml  Net    -30 ml      Physical Exam: General: Thin, cachectic appearing, in NAD.  Lungs:  Normal respiratory effort. Clear to auscultation BL without crackles or wheezes.  Heart: RRR. S1 and S2 normal without gallop, murmur, or rubs.  Abdomen:  BS normoactive. Soft, Nondistended, nontender.  Extremities: No pretibial edema with very thin legs, mild foot swelling bilaterally with no abnormalities noted in left ankle, along with no other musculoskeletal findings.     Labs:  Basic Metabolic Panel:  Recent Labs Lab 12/29/14 0225 12/30/14 1550 12/31/14 0418 01/01/15 0439 01/02/15 0538  01/02/15 1620  NA 142 147* 144 141 143 140  K 2.7* 3.9 3.7 2.9* 2.8* 3.4*  CL 114* 124* 125* 119* 117* 111  CO2 15* 16* 15* 17* 19 22  GLUCOSE 81 131* 74 94 72 140*  BUN 5* <5* <5* <5* <5* <5*  CREATININE 1.03 0.64 0.52 0.62 0.54 0.56  CALCIUM 6.5* 7.1* 7.1* 7.4* 7.2* 7.2*  MG 1.3*  --   --   --  1.1* 2.6*    Liver Function Tests:  Recent Labs Lab 12/28/14 1905 12/29/14 0225 12/30/14 1550 01/01/15 0439  AST 44* 50* 113* 86*  ALT 23 21 32 34  ALKPHOS 159* 142* 119* 114  BILITOT 0.9 1.2 0.4 0.3  PROT 5.7* 4.7* 4.4* 4.7*  ALBUMIN 2.3* 2.0* 1.8* 1.9*    Recent Labs Lab 12/28/14 1905  LIPASE 18   CBC:  Recent Labs Lab 12/28/14 1905 12/29/14 0225 12/29/14 0755 12/30/14 1304 12/31/14 0418 01/01/15 0439  WBC 12.7* 12.4* 13.0* 9.7 11.2* 11.3*  NEUTROABS 9.5*  --   --   --   --  6.8  HGB 11.9* 10.6* 10.6* 9.2* 9.4* 9.6*  HCT 38.5 34.2* 35.0* 29.9* 30.4* 31.0*  MCV 92.1 91.4 92.8 91.4 91.3 91.7  PLT 307 267 263 234 318 240    Cardiac Enzymes:  Recent Labs Lab 12/28/14 1905 12/29/14 0220 12/29/14 0755 12/29/14 1253  TROPONINI 0.13* 0.25* 0.17* 0.15*  CBG:  Recent Labs Lab 12/28/14 1850  GLUCAP 119*    Microbiology: Results for orders placed or performed during the hospital encounter of 12/28/14  Culture, blood (routine x 2)     Status: None (Preliminary result)   Collection Time: 12/28/14 10:56 PM  Result Value Ref Range Status   Specimen Description BLOOD LEFT FOREARM  Final   Special Requests BOTTLES DRAWN AEROBIC AND ANAEROBIC 5CC EA  Final   Culture   Final           BLOOD CULTURE RECEIVED NO GROWTH TO DATE CULTURE WILL BE HELD FOR 5 DAYS BEFORE ISSUING A FINAL NEGATIVE REPORT Performed at [facility]    Report Status PENDING  Incomplete  Culture, blood (routine x 2)     Status: None (Preliminary result)   Collection Time: 12/29/14 12:09 AM  Result Value Ref Range Status   Specimen Description BLOOD RIGHT WRIST  Final   Special  Requests BOTTLES DRAWN AEROBIC ONLY 3CC  Final   Culture   Final           BLOOD CULTURE RECEIVED NO GROWTH TO DATE CULTURE WILL BE HELD FOR 5 DAYS BEFORE ISSUING A FINAL NEGATIVE REPORT Performed at [facility]    Report Status PENDING  Incomplete  Urine culture     Status: None   Collection Time: 12/29/14  2:54 AM  Result Value Ref Range Status   Specimen Description URINE, CATHETERIZED  Final   Special Requests NONE  Final   Colony Count   Final    >=100,000 COLONIES/ML Performed at [facility]    Culture   Final    ENTEROBACTER AEROGENES Performed at [facility]    Report Status 12/31/2014 FINAL  Final   Organism ID, Bacteria ENTEROBACTER AEROGENES  Final      Susceptibility   Enterobacter aerogenes - MIC*    CEFAZOLIN RESISTANT      CEFTRIAXONE <=1 SENSITIVE Sensitive     CIPROFLOXACIN <=0.25 SENSITIVE Sensitive     GENTAMICIN <=1 SENSITIVE Sensitive     LEVOFLOXACIN <=0.12 SENSITIVE Sensitive     NITROFURANTOIN 64 INTERMEDIATE Intermediate     TOBRAMYCIN <=1 SENSITIVE Sensitive     TRIMETH/SULFA <=20 SENSITIVE Sensitive     PIP/TAZO <=4 SENSITIVE Sensitive     * ENTEROBACTER AEROGENES  Clostridium Difficile by PCR     Status: None   Collection Time: 12/29/14  4:13 AM  Result Value Ref Range Status   C difficile by pcr NEGATIVE NEGATIVE Final  Stool culture     Status: None   Collection Time: 12/29/14  4:13 AM  Result Value Ref Range Status   Specimen Description STOOL  Final   Special Requests NONE  Final   Culture   Final    NO SALMONELLA, SHIGELLA, CAMPYLOBACTER, YERSINIA, OR E.COLI 0157:H7 ISOLATED Performed at [facility]    Report Status 01/02/2015 FINAL  Final    Imaging: No results found.     Medications:    Infusions:    Scheduled Medications: . aspirin EC  81 mg Oral Daily  . ciprofloxacin  500 mg Oral BID  . enoxaparin (LOVENOX) injection  30 mg Subcutaneous Q24H  . feeding supplement (RESOURCE BREEZE)   1 Container Oral TID BM  . folic acid  1 mg Oral Daily  . gabapentin  800 mg Oral TID  . levothyroxine  25 mcg Oral QAC breakfast  . metroNIDAZOLE  500 mg Oral 3 times per day  .  pantoprazole  40 mg Oral Daily  . [START ON 01/04/2015] potassium chloride  40 mEq Oral Daily  . rosuvastatin  20 mg Oral Daily  . sodium chloride  3 mL Intravenous Q12H  . sucralfate  1 g Oral BID  . sulfaSALAzine  1,000 mg Oral Daily  . thiamine  100 mg Oral Daily  . venlafaxine  25 mg Oral BID    PRN Medications: clonazePAM, HYDROmorphone, meclizine, ondansetron, traZODone   Assessment/ Plan:    Principal Problem:   Chronic diarrhea of unknown origin Active Problems:   DM2 (diabetes mellitus, type 2)   Anxiety state   Crohn's disease   Metabolic acidosis   Hypokalemia   Leukocytosis   Nicotine abuse   Heme positive stool   Pulmonary embolus   Dehydration   Hypotension   Hepatic steatosis   Elevated troponin   Gastroenteritis   Acute kidney injury   Malnutrition of moderate degree  #Acute on chronic diarrhea Her symptoms are significantly improved. She continues to try to manipulate her care. I doubt that she would actually undergo a colonoscopy tomorrow.  Discussed with GI who feel that it would be best for her to get a colonoscopy as an outpatient because she continues to avoid undergoing the procedure as an inpatient, and she is medically stable.  She was seen by social work yesterday, and all of her discharge concerns were addressed. No growth on final stool culture. -Continue home sulfasalazine 1000 mg daily. -Discontinue antibiotics. -Follow-up with her GI, Dr. Ewing Schlein for outpatient colonoscopy -Regular diet.  -Follow-up final blood cultures. -Zofran when necessary. -Continue Protonix 40 mg daily. -Continue sucralfate 1 g twice a day. -Continue meclizine 25 mg when necessary. -Home health PT.   #Hypokalemia Potassium much improved yesterday afternoon, received additional  supplementation overnight. -We will discharge home on Kdur 40 mEq daily.  #Chronic pain and anxiety The left ankle pain and right sided pain that she describes are chronic for her and have been noted in notes going back several years.  She says that she has a pain specialist that she follows with.  Of note, she has received opioids from multiple doctors, including Dilaudid 4 mg PO TID PRN #42 tablets by Dr. Lunette Stands who is an orthopedic doctor.  Given the lack of new symptoms or physical findings this can be worked up as an outpatient. -Continue Dilaudid 2 mg by mouth every 6 hours as needed. -Continue clonazepam 1 mg by mouth twice a day when necessary. -Continue home trazodone, Effexor, and gabapentin.  #Anemia of chronic disease Hemoglobin stable. -Continue to monitor blood counts.  #Malnutrition -Continue feeding supplement. -Continue vitamin B-1 and folate.  #History of bilateral PE -Lovenox for DVT prophylaxis.   #Hyperlipidemia -Continue home Crestor. -Continue home aspirin 81 mg daily.   DVT PPX - low molecular weight heparin  CODE STATUS - Full.  CONSULTS PLACED - Gastroenterology, cardiology.  DISPO - Discharge home today.  The patient does have a current PCP (Shamleffer, Madelin Rear, MD) and does not need an [facility] hospital follow-up appointment after discharge.    Is the [facility] hospital follow-up appointment a one-time only appointment? not applicable.  Does the patient have transportation limitations that hinder transportation to clinic appointments? yes   SERVICE NEEDED AT DISCHARGE - TO BE DETERMINED DURING HOSPITAL COURSE         Y = Yes, Blank = No PT: SNF (refused, so HH PT)  OT:   RN:   Equipment:   Other:  Length of Stay: 5 day(s)   Signed: Donavan Foil, MD  PGY-1, Internal Medicine Resident Pager: [phone] (7AM-5PM) 01/03/2015, 8:24 AM

## 2015-01-03 NOTE — Clinical Social Work Note (Signed)
CSW consulted for transportation needs.  Patient does not have any way to get home- no family transport and is unable to take a bus.  CSW provided with taxi voucher.  CSW signing off.  Merlyn Lot, LCSWA Clinical Social Worker [phone]

## 2015-01-03 NOTE — Progress Notes (Signed)
Meds given, pt states "do not want to go home, c/o left ankle hurting x 2 weeks" pt would like to have ankle checked out before discharge. Dr. Bevely Palmer aware. Hiram Comber RN

## 2015-01-03 NOTE — Progress Notes (Signed)
After talking with pt, pt is agreeable to go home if d/c'd today. Hiram Comber RN

## 2015-01-03 NOTE — Progress Notes (Addendum)
CARE MANAGEMENT NOTE 01/03/2015  Patient:  [name], [name]   Account Number:  [id number]  Date Initiated:  12/31/2014  Documentation initiated by:  Donn Pierini  Subjective/Objective Assessment:   Pt admitted with SIRS     Action/Plan:   PTA Pt lived at home with son- she is active with [facility] services - PT eval pending   Anticipated DC Date:  01/03/2015   Anticipated DC Plan:  HOME/SELF CARE      DC Planning Services  CM consult      [facility] Choice  HOME HEALTH   Choice offered to / List presented to:  C-1 Patient   DME arranged  [facility] BED      DME agency  [facility]     HH arranged  HH-2 PT      [facility] agency  [facility]  [facility]   Status of service:  Completed, signed off Medicare Important Message given?  YES (If response is "NO", the following Medicare IM given date fields will be blank) Date Medicare IM given:  12/31/2014 Medicare IM given by:  Donn Pierini Date Additional Medicare IM given:  01/03/2015 Additional Medicare IM given by:  Isidoro Donning  Discharge Disposition:  HOME [facility] SERVICES  Per UR Regulation:    If discussed at Long Length of Stay Meetings, dates discussed:    Comments:  01/03/2015 [address] accepted referral. Isidoro Donning RN CCM Case Mgmt [phone]  01/03/2015 1100 NCM spoke to pt and offered choice for [facility]. Pt states she would prefer [facility] for [facility]. States she had [facility] in the past. Requesting hospital bed and wheelchair for home. States she lives at home with son, but son works during the day. She has an Geophysicist/field seismologist that comes from [facility] for 3 hours Monday-Friday. Contacted [facility] for referral for [facility]. Waiting confirmation. Notified [facility] for DME for home. Pt states she does not want SNF rehab. Isidoro Donning RN CCM Case Mgmt phone [phone]  12/31/14- 1600- Donn Pierini RN, BSN [phone] Spoke with pt at bedside- pt reports that she has [facility] at home-  there are a few things that [facility] was to be working with pt on such as getting pt a BP cuff for home, bed rails, etc. will f/u with [facility] regarding these things to see where they are at in assisting pt. PT eval pending- will f/u on recommendations

## 2015-01-03 NOTE — Discharge Instructions (Signed)
·   Thank you for allowing [place] to be involved in your healthcare while you were hospitalized at [facility].   Please note that there have been changes to your home medications.  --> PLEASE LOOK AT YOUR DISCHARGE MEDICATION LIST FOR DETAILS.   Please call your PCP if you have any questions or concerns, or any difficulty getting any of your medications.  Please return to the ER if you have worsening of your symptoms or new severe symptoms arise.  Follow up with Dr. Ewing Schlein to get a colonoscopy.  Follow up with you PCP regarding continuing to receive your pain medications.  You potassium has been low during the hospitalization, so we would like you to take Kdur 40 mEq daily to maintain a normal level.

## 2015-01-03 NOTE — Progress Notes (Signed)
Physical Therapy Treatment Patient Details Name: [name] MRN: [id number] DOB: October 01, 1961 Today's Date: 01/03/2015    History of Present Illness 54 y.o. female admitted with Acute on chronic diarrhea    PT Comments    Pt's participation with therapy was limited to transfers to/from the [facility] for bowel movement. Pt does not appear motivated to work with PT in this setting, however is concerned with whether she will have rehab services come to her home at d/c to work with her. Pt declining SNF at this time and wishes to return home today. Pt states she will need a BSC upon return home, and that she has discussed with CSW other needs. Will continue to follow and progress as able per POC.   Follow Up Recommendations  SNF (Pt refusing SNF - will likely return home)     Equipment Recommendations  3in1 (PT)    Recommendations for Other Services       Precautions / Restrictions Precautions Precautions: Fall Restrictions Weight Bearing Restrictions: No    Mobility  Bed Mobility Overal bed mobility: Needs Assistance Bed Mobility: Supine to Sit;Sit to Supine     Supine to sit: Modified independent (Device/Increase time) Sit to supine: Min assist   General bed mobility comments: Pt provided self assist to lift her own legs into the bed. Therapist cued pt to attempt without UE support, however did not make corrective changes.   Transfers Overall transfer level: Needs assistance Equipment used: 1 person hand held assist Transfers: Sit to/from [employer] Sit to Stand: Min assist Stand pivot transfers: Min assist       General transfer comment: Assist for balance and support as pt stood and took pivotal steps around to the [facility] Pt did not require the high level of assist that pt reported she requires.   Ambulation/Gait             General Gait Details: Further ambulation declined by pt. Reports signficant fatigue from transfer to/from the [facility].     Stairs            Wheelchair Mobility    Modified Rankin (Stroke Patients Only)       Balance Overall balance assessment: Needs assistance Sitting-balance support: No upper extremity supported Sitting balance-Leahy Scale: Fair     Standing balance support: Bilateral upper extremity supported Standing balance-Leahy Scale: Poor                      Cognition Arousal/Alertness: Awake/alert Behavior During Therapy: WFL for tasks assessed/performed Overall Cognitive Status: Within Functional Limits for tasks assessed                      Exercises      General Comments        Pertinent Vitals/Pain Pain Assessment: No/denies pain    Home Living                      Prior Function            PT Goals (current goals can now be found in the care plan section) Acute Rehab PT Goals Patient Stated Goal: Home today PT Goal Formulation: With patient Time For Goal Achievement: 01/15/15 Potential to Achieve Goals: Fair Progress towards PT goals: Progressing toward goals    Frequency  Min 3X/week    PT Plan Current plan remains appropriate    Co-evaluation  End of Session Equipment Utilized During Treatment: Gait belt Activity Tolerance: Other (comment) (Self-limiting) Patient left: in bed;with call bell/phone within reach;with family/visitor present     Time: 2725-3664 PT Time Calculation (min) (ACUTE ONLY): 19 min  Charges:  $Therapeutic Activity: 8-22 mins                    G Codes:      Conni Slipper 2015-01-23, 2:23 PM   Conni Slipper, PT, DPT Acute Rehabilitation Services Pager: [phone]

## 2015-01-03 NOTE — Patient Outreach (Signed)
[facility]N received call from patient. Patient reports she is in hospital. She reports she will be going home with home health instead of SNF. She requests follow-up from nurse CM. Patient requested CSW to call CAP program to inform them of her status. Patient reports she is close to being set-up with CAP. CSW directed patient to follow-up with social worker to make sure she is assigned to her preferred home health providers. As of last contact patient was seeking CAP services. CSW will follow-up with patient again in one week to determine needs after discharge.

## 2015-01-04 LAB — CULTURE, BLOOD (ROUTINE X 2)
CULTURE: NO GROWTH
Culture: NO GROWTH

## 2015-01-07 ENCOUNTER — Other Ambulatory Visit: Payer: Self-pay

## 2015-01-07 ENCOUNTER — Telehealth: Payer: Self-pay

## 2015-01-07 DIAGNOSIS — E118 Type 2 diabetes mellitus with unspecified complications: Secondary | ICD-10-CM

## 2015-01-07 NOTE — Patient Outreach (Addendum)
Reports that she has not checked her CBG today. Yesterdays reading of 105.  Reports that she continues to have diarrhea.   Reports that she now has her hospital bed and a wheelchair. States that her hospital bed is very comfortable. [facility] CM Care Plan        Patient Outreach Telephone from 01/07/2015 in [facility]   Care Plan Problem One  Recent admission to the hospital   Care Plan for Problem One  Active   Interventions for Problem One Long Term Goal  Discussed importance of following discharge instructions, encouraged patient to call MD to arrange a followup appointment. She has agreed to do this today.   [facility] Long Term Goal (31-90 days)  Patient will be able to report no readmission to the hospital in the next 31 days.   [facility]N Long Term Goal Start Date  01/07/15   [facility] CM Short Term Goal #1 (0-30 days)  Patient will be able to report no readmission in the next 30 days.   [facility]N CM Short Term Goal #1 Start Date  01/07/15   [facility] CM Short Term Goal #2 (0-30 days)  Patient will be able to report attending hospital follow up appointment in the next 7 days.   [facility]N CM Short Term Goal #2 Start Date  01/07/15   Care Plan for Problem Two  Active      PLAN: Will outreach patient once a week for 4 weeks. [name], RN, BSN, CEN [facility] [employer] [phone]

## 2015-01-07 NOTE — Patient Outreach (Signed)
CSW was called by patient to inform CSW of discharge from hospital. She also requested assistance with transportation to next doctor's appointment. CSW asked patient about friends and family support as well as using SCAT to which patient reports that she still lives on second floor of apartment and cannot make it down without assistance. She reports her son is currently unable to drive and she does not have anyone she knows with a car. CSW assisted patient by referring patient to transportation assistance. CSW also discussed how to order medical supplies through her doctor's office. CSW made follow-up visit for next week to update needs assessment for patient.

## 2015-01-10 ENCOUNTER — Ambulatory Visit: Payer: Medicare Other

## 2015-01-10 ENCOUNTER — Telehealth: Payer: Self-pay

## 2015-01-10 NOTE — Patient Outreach (Signed)
Patient called to reschedule appointment as doctor called out sick. Patient rescheduled appointment for Friday instead. Patient reports that he son still will not be able to drive an she cannot afford to pay anyone to take her. She also reports she has not moved to a lower apartment yet so cannot use SCAT as of yet. CSW helped set-up appointment and confirmed that CSW would assist patient with finding a floor apartment to move into. CSW is still schedule to meet with patient at home tonmorrow.

## 2015-01-11 ENCOUNTER — Other Ambulatory Visit: Payer: Self-pay

## 2015-01-11 NOTE — Patient Outreach (Signed)
[facility]) Care Management  [facility] Social Work  01/11/2015  [name] 1961/09/09 [id number]  Subjective:    Objective:   Current Medications:  Current Outpatient Prescriptions  Medication Sig Dispense Refill  . alprazolam (XANAX) 2 MG tablet Take 0.5 tablets (1 mg total) by mouth 3 (three) times daily as needed for sleep or anxiety. 10 tablet 0  . aspirin EC 81 MG EC tablet Take 1 tablet (81 mg total) by mouth daily. 30 tablet 0  . feeding supplement, RESOURCE BREEZE, (RESOURCE BREEZE) LIQD Take 1 Container by mouth 3 (three) times daily between meals.  0  . folic acid (FOLVITE) 1 MG tablet Take 1 tablet (1 mg total) by mouth daily. 30 tablet 0  . gabapentin (NEURONTIN) 800 MG tablet Take 1 tablet (800 mg total) by mouth 3 (three) times daily. 90 tablet 0  . HYDROmorphone (DILAUDID) 4 MG tablet Take 4 mg by mouth as needed for severe pain.   0  . levothyroxine (SYNTHROID, LEVOTHROID) 25 MCG tablet Take 1 tablet (25 mcg total) by mouth daily before breakfast. 30 tablet 0  . meclizine (ANTIVERT) 25 MG tablet Take 25 mg by mouth 3 (three) times daily as needed for dizziness.    . naproxen sodium (ANAPROX) 220 MG tablet Take 220 mg by mouth as needed (for pain).    . ondansetron (ZOFRAN) 4 MG tablet Take 4 mg by mouth every 8 (eight) hours as needed for nausea or vomiting.    . pantoprazole (PROTONIX) 20 MG tablet Take 2 tablets (40 mg total) by mouth daily. 30 tablet 1  . potassium chloride SA (K-DUR,KLOR-CON) 20 MEQ tablet Take 2 tablets (40 mEq total) by mouth daily. 30 tablet 0  . rosuvastatin (CRESTOR) 20 MG tablet Take 1 tablet (20 mg total) by mouth daily. 30 tablet 0  . sucralfate (CARAFATE) 1 G tablet Take 1 tablet (1 g total) by mouth 2 (two) times daily. 60 tablet 0  . sulfaSALAzine (AZULFIDINE) 500 MG tablet Take 2 tablets (1,000 mg total) by mouth daily. 30 tablet 0  . thiamine (VITAMIN B-1) 100 MG tablet Take 1 tablet (100 mg total) by mouth daily. 30  tablet 0  . traZODone (DESYREL) 25 mg TABS tablet Take 0.5 tablets (25 mg total) by mouth at bedtime as needed for sleep. 30 tablet 0  . venlafaxine (EFFEXOR) 25 MG tablet Take 1 tablet (25 mg total) by mouth 2 (two) times daily. 30 tablet 0   No current facility-administered medications for this visit.    Functional Status:  In your present state of health, do you have any difficulty performing the following activities: 12/29/2014 07/26/2014  Hearing? N N  Vision? N N  Difficulty concentrating or making decisions? N N  Walking or climbing stairs? Y Y  Dressing or bathing? Y Y  Doing errands, shopping? [name]    Fall/Depression Screening:  PHQ 2/9 Scores 01/07/2015  PHQ - 2 Score 4  PHQ- 9 Score 17    Assessment: CSW met with patient at home. CSW assessed needs with medications and medical hx. CSW assisted patient in calling advanced home care to help reorder incontinence supplies. CSW also helped patient work with housing office to help move patient to a first floor apartment. CSW made plasn to follow-up with patient via telephone in one week.   Plan: CSW will follow-up with doctor's office to make sure patient receives supplies. CSW will also follow-up with apartment office to help patient transition to a  first floor apartment.

## 2015-01-14 ENCOUNTER — Other Ambulatory Visit: Payer: Self-pay

## 2015-01-14 ENCOUNTER — Other Ambulatory Visit: Payer: Medicare Other

## 2015-01-14 DIAGNOSIS — I10 Essential (primary) hypertension: Secondary | ICD-10-CM | POA: Diagnosis not present

## 2015-01-14 DIAGNOSIS — E785 Hyperlipidemia, unspecified: Secondary | ICD-10-CM | POA: Diagnosis not present

## 2015-01-14 DIAGNOSIS — E1140 Type 2 diabetes mellitus with diabetic neuropathy, unspecified: Secondary | ICD-10-CM | POA: Diagnosis not present

## 2015-01-14 DIAGNOSIS — D509 Iron deficiency anemia, unspecified: Secondary | ICD-10-CM | POA: Diagnosis not present

## 2015-01-14 DIAGNOSIS — I2782 Chronic pulmonary embolism: Secondary | ICD-10-CM | POA: Diagnosis not present

## 2015-01-14 NOTE — Patient Outreach (Signed)
Placed call to patient for transition of care. No answer. Left a message requesting a call back.

## 2015-01-18 ENCOUNTER — Ambulatory Visit: Payer: Medicare Other

## 2015-01-18 ENCOUNTER — Other Ambulatory Visit: Payer: Self-pay

## 2015-01-18 NOTE — Patient Outreach (Signed)
[facility]) Care Management  01/18/2015  [name] 1960/12/18 [id number]   CSW called patient but was unable to reach. CSW left voicemail requesting call back from patient.

## 2015-01-21 ENCOUNTER — Telehealth: Payer: Self-pay

## 2015-01-21 ENCOUNTER — Ambulatory Visit: Payer: Self-pay

## 2015-01-21 ENCOUNTER — Ambulatory Visit: Payer: Medicare Other

## 2015-01-21 ENCOUNTER — Other Ambulatory Visit: Payer: Self-pay

## 2015-01-21 NOTE — Patient Outreach (Signed)
Placed call to patient. No answer. No return call from last weeks attempt. Will continue to attempt outreach.

## 2015-01-21 NOTE — Patient Outreach (Signed)
[facility]) Care Management  01/21/2015  [name] [birth year] [id number]   CSW called but was only able to leave a voicemail message. CSW requested that patient call back for follow-up.

## 2015-01-28 ENCOUNTER — Other Ambulatory Visit: Payer: Self-pay

## 2015-01-28 ENCOUNTER — Ambulatory Visit: Payer: Medicare Other

## 2015-01-28 NOTE — Patient Outreach (Signed)
Called patient. No answer.  Discharge date of 01/03/3015.   Plan: Will attempt again. Will send outreach letter to patient.  [name], RN, BSN, CEN [facility] [employer] [phone]

## 2015-02-02 ENCOUNTER — Other Ambulatory Visit: Payer: Self-pay

## 2015-02-02 NOTE — Patient Outreach (Addendum)
Transition of care call:  Multiple attempts to reach patient unsuccessful.  Letters sent to patient  Without response.  This is was the final call to patient.  She answered and reports that she is doing terrible.  States hat she fell about 3 weeks ago and now has pain in her right hip, right foot, right knee. Reports that she has not gone to MD because she can not get down the stairs at her apartment. Reports that she should with  Nurse today at orthopedic office.  Encouraged patient that if she needs to get medical attention that should could call 911.    Patient reports that diabetes is under control at this time. ( states that MD took her off her DM meds)  Patient currently reports CBG levels 100-130. Last checked several days ago.  Patient reports that she needs help with transportation, CAP services. Patient reports that social worker was helping her but she has not heard from anyone.   Depression screening is high.  ( on meds) Nutrtitional risk screening is abnormal. Patient currently is drinking boost  For meal supplements but has had a weight loss. States that she know she needs to eat more but does not have a good appetite. Reports that she has food at home. Fall risk screening positive:  Offered suggestions.  Suggested patient might be interested in home health referral for physical therapy and patient has declined.  Discussed concern with patient about her being able to manage at home with limited support.  She currently has a caregiver from 9:30-12:30 daily. Reports her son is in and out of the home.  Patient reply that she needs more PCS hours and CAP and then she thinks she could manage.  Patient reports that she does not like her hospital bed. Encouraged patient to call DME company to inquire about other alternatives. Suggested patient call ASAP.    PLAN: Will follow up with newly assigned social worker regarding immediate concerns.  Will send this note to MD for assessment  review.  No current nursing concerns that this patient is willing to work on. Will call patient in 2 weeks to follow up on hospital  bed concern. Plan to close to nursing in 2 weeks as patient will be follow by [facility] social worker for concerns.  [name], RN, BSN, CEN [facility] [employer] [phone]

## 2015-02-10 ENCOUNTER — Other Ambulatory Visit: Payer: Self-pay | Admitting: *Deleted

## 2015-02-10 NOTE — Patient Outreach (Signed)
[facility]) Care Management  02/10/2015  MARTRICE APT [birth year] [id number]   Phone call to patient to assess for social work needs.  Patient's transportation issues partly explored.  Patient states that she has [employer]AT however, has difficulty riding the [employer] bus because the ride hurts her back and hips.  Part way into the conversation, patient requested that this social worker call her back.  This social worker will call patient back on 02/11/15.   [name] [facility] Care Management [phone]

## 2015-02-14 DIAGNOSIS — M810 Age-related osteoporosis without current pathological fracture: Secondary | ICD-10-CM | POA: Diagnosis not present

## 2015-02-14 DIAGNOSIS — E039 Hypothyroidism, unspecified: Secondary | ICD-10-CM | POA: Diagnosis not present

## 2015-02-14 DIAGNOSIS — E119 Type 2 diabetes mellitus without complications: Secondary | ICD-10-CM | POA: Diagnosis not present

## 2015-02-14 DIAGNOSIS — M1990 Unspecified osteoarthritis, unspecified site: Secondary | ICD-10-CM | POA: Diagnosis not present

## 2015-02-15 ENCOUNTER — Ambulatory Visit: Payer: Self-pay

## 2015-02-15 ENCOUNTER — Other Ambulatory Visit: Payer: Self-pay

## 2015-02-15 NOTE — Patient Outreach (Signed)
Follow up phone call to patient about hospital bed.  Placed calls x 2. No response.   Patient has successfully completed transition of care program. Will attempt follow up again. [name], RN, BSN, CEN [facility] [employer] [phone]

## 2015-02-17 ENCOUNTER — Other Ambulatory Visit: Payer: Self-pay | Admitting: *Deleted

## 2015-02-17 NOTE — Patient Outreach (Signed)
[facility]) Care Management  02/17/2015  [name] 1960-12-13 [id number]  Phone call to patient to assess for social work needs.  Patient discussed needing assistance with the following:  CAP(Community Alternative Program for disabled and Elderly Adults) services.  Reports receiving a welcome letter,  however services have not begun yet.  Per patient she does receive personal care services through [facility] daily.      Replacement bed:  She received a bed through [facility] that she no longer likes and would like another one.    Patient  requesting physical therapy: staes that after a fall approximately 3 weeks ago she is experiencing pain(  foot, knee, hip, shoulder and back).  Patient also requesting help with incontinent supplies as well.  Transportation: SCAT available per previous notes, however states that she cannot use it as she cannot leave her second story apartment without assistance.  Hadicapped accessible apartment:  Patient lives on the second floor and would like to live on the ground level.  Per patient, she does not want to move to a first floor apartment in her current residence.  Phone call made to the CAP office-spoke with Jasmine who confirmed that welcome letter was sent out, however no worker has been assigned to patient possibly due to paperwork needed from [facility]  Supervisor is out until the end of the month.   Phone call to MD made requesting order for physical therapy and incontinent supplies.  Community resources to be mailed to patient related to low income/accessible housing.  Phone number given to patient to [facility] to request hospital bed change.  RNCM informed of the above requests.

## 2015-02-23 ENCOUNTER — Other Ambulatory Visit: Payer: Self-pay

## 2015-02-23 NOTE — Patient Outreach (Signed)
Case closure for nursing call to patient:  Placed call to patient who reports that she is OK.   I inquired if patient had called advanced home health about her hospital bed and reports no. I provided patient again with phone number.   Patient with questions about CAP services. I provided patient with [name] telephone number. I encouraged patient to call social worker and advanced home health.  No nursing needs identified at this time. Patient has verbalized that she did not have any goals to work on at this time.  Plan: Will close to nursing. Patient remains active with [facility] Child psychotherapist.  Will send in basket to [employer] and [employer].   [name], RN, BSN, CEN [facility] [employer] [phone]

## 2015-02-28 ENCOUNTER — Emergency Department (HOSPITAL_COMMUNITY): Payer: Medicare Other

## 2015-02-28 ENCOUNTER — Encounter (HOSPITAL_COMMUNITY): Payer: Self-pay | Admitting: Emergency Medicine

## 2015-02-28 ENCOUNTER — Inpatient Hospital Stay (HOSPITAL_COMMUNITY)
Admission: EM | Admit: 2015-02-28 | Discharge: 2015-03-04 | DRG: 377 | Disposition: A | Discharge status: Home / Self Care | Payer: Medicare Other | Attending: Internal Medicine | Admitting: Internal Medicine

## 2015-02-28 DIAGNOSIS — E872 Acidosis, unspecified: Secondary | ICD-10-CM | POA: Diagnosis present

## 2015-02-28 DIAGNOSIS — J45909 Unspecified asthma, uncomplicated: Secondary | ICD-10-CM | POA: Diagnosis present

## 2015-02-28 DIAGNOSIS — E875 Hyperkalemia: Secondary | ICD-10-CM | POA: Diagnosis not present

## 2015-02-28 DIAGNOSIS — Z888 Allergy status to other drugs, medicaments and biological substances status: Secondary | ICD-10-CM | POA: Diagnosis not present

## 2015-02-28 DIAGNOSIS — D649 Anemia, unspecified: Secondary | ICD-10-CM | POA: Diagnosis not present

## 2015-02-28 DIAGNOSIS — R0789 Other chest pain: Secondary | ICD-10-CM | POA: Diagnosis present

## 2015-02-28 DIAGNOSIS — E860 Dehydration: Secondary | ICD-10-CM | POA: Diagnosis not present

## 2015-02-28 DIAGNOSIS — W19XXXA Unspecified fall, initial encounter: Secondary | ICD-10-CM

## 2015-02-28 DIAGNOSIS — K921 Melena: Secondary | ICD-10-CM | POA: Diagnosis not present

## 2015-02-28 DIAGNOSIS — S0990XA Unspecified injury of head, initial encounter: Secondary | ICD-10-CM | POA: Diagnosis not present

## 2015-02-28 DIAGNOSIS — E039 Hypothyroidism, unspecified: Secondary | ICD-10-CM | POA: Diagnosis not present

## 2015-02-28 DIAGNOSIS — S2239XA Fracture of one rib, unspecified side, initial encounter for closed fracture: Secondary | ICD-10-CM | POA: Diagnosis present

## 2015-02-28 DIAGNOSIS — R5381 Other malaise: Secondary | ICD-10-CM | POA: Diagnosis present

## 2015-02-28 DIAGNOSIS — G8929 Other chronic pain: Secondary | ICD-10-CM | POA: Diagnosis present

## 2015-02-28 DIAGNOSIS — E43 Unspecified severe protein-calorie malnutrition: Secondary | ICD-10-CM | POA: Diagnosis not present

## 2015-02-28 DIAGNOSIS — Z86711 Personal history of pulmonary embolism: Secondary | ICD-10-CM | POA: Diagnosis not present

## 2015-02-28 DIAGNOSIS — K5010 Crohn's disease of large intestine without complications: Secondary | ICD-10-CM | POA: Diagnosis present

## 2015-02-28 DIAGNOSIS — D62 Acute posthemorrhagic anemia: Secondary | ICD-10-CM | POA: Diagnosis not present

## 2015-02-28 DIAGNOSIS — D539 Nutritional anemia, unspecified: Secondary | ICD-10-CM | POA: Diagnosis present

## 2015-02-28 DIAGNOSIS — H409 Unspecified glaucoma: Secondary | ICD-10-CM | POA: Diagnosis present

## 2015-02-28 DIAGNOSIS — Z7982 Long term (current) use of aspirin: Secondary | ICD-10-CM

## 2015-02-28 DIAGNOSIS — Z681 Body mass index (BMI) 19 or less, adult: Secondary | ICD-10-CM

## 2015-02-28 DIAGNOSIS — R195 Other fecal abnormalities: Secondary | ICD-10-CM | POA: Diagnosis present

## 2015-02-28 DIAGNOSIS — R52 Pain, unspecified: Secondary | ICD-10-CM

## 2015-02-28 DIAGNOSIS — Z880 Allergy status to penicillin: Secondary | ICD-10-CM

## 2015-02-28 DIAGNOSIS — E785 Hyperlipidemia, unspecified: Secondary | ICD-10-CM | POA: Diagnosis present

## 2015-02-28 DIAGNOSIS — S3993XA Unspecified injury of pelvis, initial encounter: Secondary | ICD-10-CM | POA: Diagnosis not present

## 2015-02-28 DIAGNOSIS — R1013 Epigastric pain: Secondary | ICD-10-CM | POA: Diagnosis not present

## 2015-02-28 DIAGNOSIS — W010XXA Fall on same level from slipping, tripping and stumbling without subsequent striking against object, initial encounter: Secondary | ICD-10-CM | POA: Diagnosis present

## 2015-02-28 DIAGNOSIS — E1122 Type 2 diabetes mellitus with diabetic chronic kidney disease: Secondary | ICD-10-CM | POA: Diagnosis not present

## 2015-02-28 DIAGNOSIS — E870 Hyperosmolality and hypernatremia: Secondary | ICD-10-CM | POA: Diagnosis present

## 2015-02-28 DIAGNOSIS — K5090 Crohn's disease, unspecified, without complications: Secondary | ICD-10-CM | POA: Diagnosis present

## 2015-02-28 DIAGNOSIS — G629 Polyneuropathy, unspecified: Secondary | ICD-10-CM | POA: Diagnosis present

## 2015-02-28 DIAGNOSIS — F17210 Nicotine dependence, cigarettes, uncomplicated: Secondary | ICD-10-CM | POA: Diagnosis present

## 2015-02-28 DIAGNOSIS — K760 Fatty (change of) liver, not elsewhere classified: Secondary | ICD-10-CM | POA: Diagnosis not present

## 2015-02-28 DIAGNOSIS — E876 Hypokalemia: Secondary | ICD-10-CM | POA: Diagnosis present

## 2015-02-28 DIAGNOSIS — K922 Gastrointestinal hemorrhage, unspecified: Principal | ICD-10-CM | POA: Diagnosis present

## 2015-02-28 DIAGNOSIS — W19XXXS Unspecified fall, sequela: Secondary | ICD-10-CM | POA: Diagnosis not present

## 2015-02-28 DIAGNOSIS — E1165 Type 2 diabetes mellitus with hyperglycemia: Secondary | ICD-10-CM | POA: Diagnosis present

## 2015-02-28 DIAGNOSIS — K529 Noninfective gastroenteritis and colitis, unspecified: Secondary | ICD-10-CM | POA: Diagnosis present

## 2015-02-28 DIAGNOSIS — K2971 Gastritis, unspecified, with bleeding: Secondary | ICD-10-CM | POA: Diagnosis not present

## 2015-02-28 DIAGNOSIS — Z7289 Other problems related to lifestyle: Secondary | ICD-10-CM | POA: Diagnosis not present

## 2015-02-28 DIAGNOSIS — I10 Essential (primary) hypertension: Secondary | ICD-10-CM | POA: Diagnosis present

## 2015-02-28 DIAGNOSIS — Z79899 Other long term (current) drug therapy: Secondary | ICD-10-CM

## 2015-02-28 DIAGNOSIS — S3991XA Unspecified injury of abdomen, initial encounter: Secondary | ICD-10-CM | POA: Diagnosis not present

## 2015-02-28 DIAGNOSIS — S299XXA Unspecified injury of thorax, initial encounter: Secondary | ICD-10-CM | POA: Diagnosis not present

## 2015-02-28 DIAGNOSIS — E118 Type 2 diabetes mellitus with unspecified complications: Secondary | ICD-10-CM

## 2015-02-28 DIAGNOSIS — R0781 Pleurodynia: Secondary | ICD-10-CM | POA: Diagnosis not present

## 2015-02-28 LAB — COMPREHENSIVE METABOLIC PANEL
ALBUMIN: 2 g/dL — AB (ref 3.5–5.0)
ALBUMIN: 1.8 g/dL — AB (ref 3.5–5.0)
ALT: 63 U/L — ABNORMAL HIGH (ref 14–54)
ALT: 55 U/L — AB (ref 14–54)
AST: 171 U/L — ABNORMAL HIGH (ref 15–41)
AST: 143 U/L — ABNORMAL HIGH (ref 15–41)
Alkaline Phosphatase: 222 U/L — ABNORMAL HIGH (ref 38–126)
Alkaline Phosphatase: 207 U/L — ABNORMAL HIGH (ref 38–126)
Anion gap: 17 — ABNORMAL HIGH (ref 5–15)
Anion gap: 8 (ref 5–15)
BUN: 7 mg/dL (ref 6–20)
BUN: 6 mg/dL (ref 6–20)
CALCIUM: 6.5 mg/dL — AB (ref 8.9–10.3)
CHLORIDE: 107 mmol/L (ref 101–111)
CO2: 23 mmol/L (ref 22–32)
CO2: 26 mmol/L (ref 22–32)
CREATININE: 0.99 mg/dL (ref 0.44–1.00)
CREATININE: 0.92 mg/dL (ref 0.44–1.00)
Calcium: 7.4 mg/dL — ABNORMAL LOW (ref 8.9–10.3)
Chloride: 112 mmol/L — ABNORMAL HIGH (ref 101–111)
GFR calc Af Amer: >60 mL/min (ref >60)
GFR calc Af Amer: >60 mL/min (ref >60)
GFR calc non Af Amer: >60 mL/min (ref >60)
GLUCOSE: 132 mg/dL — AB (ref 65–99)
GLUCOSE: 113 mg/dL — AB (ref 65–99)
Potassium: 3.2 mmol/L — ABNORMAL LOW (ref 3.5–5.1)
Potassium: 3.2 mmol/L — ABNORMAL LOW (ref 3.5–5.1)
SODIUM: 147 mmol/L — AB (ref 135–145)
Sodium: 146 mmol/L — ABNORMAL HIGH (ref 135–145)
Total Bilirubin: 1 mg/dL (ref 0.3–1.2)
Total Bilirubin: 0.8 mg/dL (ref 0.3–1.2)
Total Protein: 5.4 g/dL — ABNORMAL LOW (ref 6.5–8.1)
Total Protein: 4.7 g/dL — ABNORMAL LOW (ref 6.5–8.1)

## 2015-02-28 LAB — GRAM STAIN

## 2015-02-28 LAB — I-STAT CHEM 8, ED
BUN: 4 mg/dL — AB (ref 6–20)
Calcium, Ion: 0.89 mmol/L — ABNORMAL LOW (ref 1.12–1.23)
Chloride: 107 mmol/L (ref 101–111)
Creatinine, Ser: 0.8 mg/dL (ref 0.44–1.00)
Glucose, Bld: 128 mg/dL — ABNORMAL HIGH (ref 65–99)
HCT: 36 % (ref 36.0–46.0)
HEMOGLOBIN: 12.2 g/dL (ref 12.0–15.0)
Potassium: 3 mmol/L — ABNORMAL LOW (ref 3.5–5.1)
Sodium: 145 mmol/L (ref 135–145)
TCO2: 19 mmol/L (ref 0–100)

## 2015-02-28 LAB — VITAMIN B12: VITAMIN B 12: 655 pg/mL (ref 180–914)

## 2015-02-28 LAB — CBC
HEMATOCRIT: 31.1 % — AB (ref 36.0–46.0)
HEMATOCRIT: 26.8 % — AB (ref 36.0–46.0)
HEMOGLOBIN: 9.7 g/dL — AB (ref 12.0–15.0)
Hemoglobin: 8.4 g/dL — ABNORMAL LOW (ref 12.0–15.0)
MCH: 27.9 pg (ref 26.0–34.0)
MCH: 28.1 pg (ref 26.0–34.0)
MCHC: 31.2 g/dL (ref 30.0–36.0)
MCHC: 31.3 g/dL (ref 30.0–36.0)
MCV: 89.4 fL (ref 78.0–100.0)
MCV: 89.6 fL (ref 78.0–100.0)
PLATELETS: 250 10*3/uL (ref 150–400)
Platelets: 222 10*3/uL (ref 150–400)
RBC: 3.48 MIL/uL — AB (ref 3.87–5.11)
RBC: 2.99 MIL/uL — AB (ref 3.87–5.11)
RDW: 19 % — AB (ref 11.5–15.5)
RDW: 19.3 % — AB (ref 11.5–15.5)
WBC: 10.5 10*3/uL (ref 4.0–10.5)
WBC: 11.5 10*3/uL — AB (ref 4.0–10.5)

## 2015-02-28 LAB — POC OCCULT BLOOD, ED: Fecal Occult Bld: POSITIVE — AB

## 2015-02-28 LAB — FOLATE: Folate: 19.8 ng/mL (ref >5.9)

## 2015-02-28 LAB — RAPID URINE DRUG SCREEN, HOSP PERFORMED
Amphetamines: NOT DETECTED
BARBITURATES: NOT DETECTED
BENZODIAZEPINES: POSITIVE — AB
COCAINE: NOT DETECTED
Opiates: POSITIVE — AB
Tetrahydrocannabinol: POSITIVE — AB

## 2015-02-28 LAB — RETICULOCYTES
RBC.: 3.1 MIL/uL — ABNORMAL LOW (ref 3.87–5.11)
RETIC CT PCT: 2.3 % (ref 0.4–3.1)
Retic Count, Absolute: 71.3 10*3/uL (ref 19.0–186.0)

## 2015-02-28 LAB — URINE MICROSCOPIC-ADD ON

## 2015-02-28 LAB — FERRITIN: Ferritin: 43 ng/mL (ref 11–307)

## 2015-02-28 LAB — IRON AND TIBC
IRON: 182 ug/dL — AB (ref 28–170)
Saturation Ratios: 84 % — ABNORMAL HIGH (ref 10.4–31.8)
TIBC: 216 ug/dL — AB (ref 250–450)
UIBC: 34 ug/dL

## 2015-02-28 LAB — URINALYSIS, ROUTINE W REFLEX MICROSCOPIC
Glucose, UA: NEGATIVE mg/dL
Ketones, ur: 15 mg/dL — AB
Nitrite: NEGATIVE
Protein, ur: 100 mg/dL — AB
Specific Gravity, Urine: 1.023 (ref 1.005–1.030)
UROBILINOGEN UA: 1 mg/dL (ref 0.0–1.0)
pH: 5.5 (ref 5.0–8.0)

## 2015-02-28 LAB — CK: Total CK: 112 U/L (ref 38–234)

## 2015-02-28 LAB — I-STAT CG4 LACTIC ACID, ED
Lactic Acid, Venous: 7.59 mmol/L (ref 0.5–2.0)
Lactic Acid, Venous: 3.69 mmol/L (ref 0.5–2.0)

## 2015-02-28 LAB — TSH: TSH: 0.659 u[IU]/mL (ref 0.350–4.500)

## 2015-02-28 LAB — I-STAT TROPONIN, ED: Troponin i, poc: 0.01 ng/mL (ref 0.00–0.08)

## 2015-02-28 LAB — PROCALCITONIN

## 2015-02-28 LAB — MRSA PCR SCREENING: MRSA BY PCR: NEGATIVE

## 2015-02-28 MED ORDER — GABAPENTIN 800 MG PO TABS
800.0000 mg | ORAL_TABLET | Freq: Three times a day (TID) | ORAL | Status: DC
  Filled 2015-02-28: qty 1

## 2015-02-28 MED ORDER — SODIUM CHLORIDE 0.9 % IV BOLUS (SEPSIS)
1000.0000 mL | Freq: Once | INTRAVENOUS | Status: AC
  Administered 2015-02-28: 1000 mL via INTRAVENOUS

## 2015-02-28 MED ORDER — LEVOTHYROXINE SODIUM 25 MCG PO TABS
25.0000 ug | ORAL_TABLET | Freq: Every day | ORAL | Status: DC
  Administered 2015-03-01 – 2015-03-04 (×4): 25 ug via ORAL
  Filled 2015-02-28 (×6): qty 1

## 2015-02-28 MED ORDER — GABAPENTIN 400 MG PO CAPS
800.0000 mg | ORAL_CAPSULE | Freq: Three times a day (TID) | ORAL | Status: DC
  Administered 2015-02-28 – 2015-03-04 (×11): 800 mg via ORAL
  Filled 2015-02-28 (×13): qty 2

## 2015-02-28 MED ORDER — PANTOPRAZOLE SODIUM 40 MG IV SOLR: 40.0000 mg | Freq: Two times a day (BID) | INTRAVENOUS | Status: DC

## 2015-02-28 MED ORDER — PANTOPRAZOLE SODIUM 40 MG IV SOLR
80.0000 mg | Freq: Once | INTRAVENOUS | Status: AC
  Administered 2015-02-28: 80 mg via INTRAVENOUS
  Filled 2015-02-28: qty 80

## 2015-02-28 MED ORDER — HYDROMORPHONE HCL 1 MG/ML IJ SOLN
1.0000 mg | INTRAMUSCULAR | Status: DC | PRN
  Administered 2015-02-28 – 2015-03-04 (×17): 1 mg via INTRAVENOUS
  Filled 2015-02-28 (×17): qty 1

## 2015-02-28 MED ORDER — MECLIZINE HCL 25 MG PO TABS
25.0000 mg | ORAL_TABLET | Freq: Three times a day (TID) | ORAL | Status: DC | PRN
  Filled 2015-02-28: qty 1

## 2015-02-28 MED ORDER — ONDANSETRON HCL 4 MG/2ML IJ SOLN
4.0000 mg | Freq: Four times a day (QID) | INTRAMUSCULAR | Status: DC | PRN
  Administered 2015-02-28 – 2015-03-04 (×3): 4 mg via INTRAVENOUS
  Filled 2015-02-28 (×3): qty 2

## 2015-02-28 MED ORDER — ONDANSETRON HCL 4 MG PO TABS: 4.0000 mg | ORAL_TABLET | Freq: Three times a day (TID) | ORAL | Status: DC | PRN

## 2015-02-28 MED ORDER — POLYETHYLENE GLYCOL 3350 17 G PO PACK
17.0000 g | PACK | Freq: Two times a day (BID) | ORAL | Status: DC
  Administered 2015-03-01 – 2015-03-04 (×4): 17 g via ORAL
  Filled 2015-02-28 (×9): qty 1

## 2015-02-28 MED ORDER — FENTANYL CITRATE (PF) 100 MCG/2ML IJ SOLN
25.0000 ug | Freq: Once | INTRAMUSCULAR | Status: AC
  Administered 2015-02-28: 25 ug via INTRAVENOUS
  Filled 2015-02-28: qty 2

## 2015-02-28 MED ORDER — ALUM & MAG HYDROXIDE-SIMETH 200-200-20 MG/5ML PO SUSP: 30.0000 mL | Freq: Four times a day (QID) | ORAL | Status: DC | PRN

## 2015-02-28 MED ORDER — MORPHINE SULFATE 2 MG/ML IJ SOLN: 1.0000 mg | INTRAMUSCULAR | Status: DC | PRN

## 2015-02-28 MED ORDER — SODIUM CHLORIDE 0.9 % IJ SOLN
3.0000 mL | Freq: Two times a day (BID) | INTRAMUSCULAR | Status: DC
  Administered 2015-03-02 – 2015-03-03 (×3): 3 mL via INTRAVENOUS

## 2015-02-28 MED ORDER — SODIUM CHLORIDE 0.9 % IV SOLN
8.0000 mg/h | INTRAVENOUS | Status: DC
  Administered 2015-02-28 – 2015-03-02 (×4): 8 mg/h via INTRAVENOUS
  Filled 2015-02-28 (×11): qty 80

## 2015-02-28 MED ORDER — POTASSIUM CHLORIDE 10 MEQ/100ML IV SOLN
10.0000 meq | INTRAVENOUS | Status: AC
  Administered 2015-02-28: 10 meq via INTRAVENOUS
  Filled 2015-02-28: qty 100

## 2015-02-28 MED ORDER — POTASSIUM CHLORIDE CRYS ER 20 MEQ PO TBCR
40.0000 meq | EXTENDED_RELEASE_TABLET | Freq: Every day | ORAL | Status: DC
  Filled 2015-02-28: qty 2

## 2015-02-28 MED ORDER — SODIUM CHLORIDE 0.9 % IV SOLN
INTRAVENOUS | Status: DC
  Administered 2015-02-28 – 2015-03-03 (×6): via INTRAVENOUS

## 2015-02-28 MED ORDER — ONDANSETRON HCL 4 MG PO TABS: 4.0000 mg | ORAL_TABLET | Freq: Four times a day (QID) | ORAL | Status: DC | PRN

## 2015-02-28 MED ORDER — HYDROMORPHONE HCL 2 MG PO TABS: 4.0000 mg | ORAL_TABLET | Freq: Four times a day (QID) | ORAL | Status: DC | PRN

## 2015-02-28 MED ORDER — FOLIC ACID 1 MG PO TABS
1.0000 mg | ORAL_TABLET | Freq: Every day | ORAL | Status: DC
  Administered 2015-03-02 – 2015-03-04 (×3): 1 mg via ORAL
  Filled 2015-02-28 (×4): qty 1

## 2015-02-28 NOTE — ED Notes (Signed)
NOTIFIED ALLISON ,RN @17 :54PM OF PATIENTS LAB RESULTS                                                                                                                                                                                                                                                                                                                                                                                                                                   CG4+LACTIC ACID.

## 2015-02-28 NOTE — Plan of Care (Signed)
RN paged asking if pt can have ice chips. Chart reviewed including GI consult note. Per GI, pt may have clear liquid diet. Diet changed, no reds.  Jimmye Norman, NP [facility]

## 2015-02-28 NOTE — ED Notes (Signed)
Pt here from home with c/o fall  sat night , pt is c/o some slight neck pain but mostly c/o right rib pain

## 2015-02-28 NOTE — ED Notes (Signed)
Phlebotomy at bedside.

## 2015-02-28 NOTE — ED Provider Notes (Signed)
CSN: [id number]     Arrival date & time 02/28/15  1158 History   First MD Initiated Contact with Patient 02/28/15 1159     Chief Complaint  Patient presents with  . Fall     (Consider location/radiation/quality/duration/timing/severity/associated sxs/prior Treatment) HPI Comments: 54 year old female past medical history significant for diabetes hypertension and Crohn's disease who comes with complaint of fall. Patient reports she had a fall 2 days ago. Reports this was a mechanical fall where she struck the right side of her chest and abdomen on a dresser. He continues to have pain in that area since that time. Describes sharp stabbing pain localized to right abdomen and right chest without radiation. It is worse with palpation and deep inspiration. Patient did strike her head with a small laceration behind the right ear. Patient does also endorse one episode of dark coffee ground emesis this morning  Patient is a 54 y.o. female presenting with fall.  Fall This is a new problem. The current episode started in the past 7 days. The problem occurs rarely. The problem has been unchanged. Associated symptoms include abdominal pain, chest pain and fatigue. Pertinent negatives include no congestion, headaches or rash. Exacerbated by: Palpation. She has tried nothing for the symptoms.    Past Medical History  Diagnosis Date  . Glaucoma   . Diabetes mellitus without complication   . Hypertension   . Neuropathy   . Crohn disease   . Asthma   . Pulmonary embolus 05/24/2014   Past Surgical History  Procedure Laterality Date  . Cesarean section    . Esophagogastroduodenoscopy N/A 05/21/2014    Procedure: ESOPHAGOGASTRODUODENOSCOPY (EGD);  Surgeon: Petra Kuba, MD;  Location: [facility] ENDOSCOPY;  Service: Endoscopy;  Laterality: N/A;   Family History  Problem Relation Age of Onset  . Breast cancer Mother   . Heart disease Mother 68    CABG  . Diabetes Mother   . Heart disease Father 75    CABG  .  Diabetes Father    History  Substance Use Topics  . Smoking status: Current Every Day Smoker -- 0.25 packs/day for 26 years    Types: Cigarettes  . Smokeless tobacco: Never Used  . Alcohol Use: No   OB History    No data available     Review of Systems  Constitutional: Positive for fatigue.  HENT: Negative for congestion.   Respiratory: Negative for shortness of breath.   Cardiovascular: Positive for chest pain.  Gastrointestinal: Positive for abdominal pain.  Musculoskeletal: Negative for back pain.  Skin: Negative for rash.  Neurological: Negative for headaches.  Psychiatric/Behavioral: Negative for confusion.  All other systems reviewed and are negative.     Allergies  Iohexol; Spiriva handihaler; Ativan; and Penicillins  Home Medications   Prior to Admission medications   Medication Sig Start Date End Date Taking? Authorizing Provider  alprazolam Prudy Feeler) 2 MG tablet Take 0.5 tablets (1 mg total) by mouth 3 (three) times daily as needed for sleep or anxiety. Patient not taking: Reported on 02/02/2015 08/02/14   Kathlen Mody, MD  aspirin EC 81 MG EC tablet Take 1 tablet (81 mg total) by mouth daily. Patient not taking: Reported on 02/02/2015 01/03/15   Adrian Blackwater Moding, MD  feeding supplement, RESOURCE BREEZE, (RESOURCE BREEZE) LIQD Take 1 Container by mouth 3 (three) times daily between meals. 08/02/14   Kathlen Mody, MD  folic acid (FOLVITE) 1 MG tablet Take 1 tablet (1 mg total) by mouth daily. 08/02/14  Kathlen Mody, MD  gabapentin (NEURONTIN) 800 MG tablet Take 1 tablet (800 mg total) by mouth 3 (three) times daily. 08/02/14   Kathlen Mody, MD  HYDROmorphone (DILAUDID) 4 MG tablet Take 4 mg by mouth as needed for severe pain.  12/02/14   Historical Provider, MD  levothyroxine (SYNTHROID, LEVOTHROID) 25 MCG tablet Take 1 tablet (25 mcg total) by mouth daily before breakfast. 08/02/14   Kathlen Mody, MD  meclizine (ANTIVERT) 25 MG tablet Take 25 mg by mouth 3 (three) times daily  as needed for dizziness.    Historical Provider, MD  naproxen sodium (ANAPROX) 220 MG tablet Take 220 mg by mouth as needed (for pain).    Historical Provider, MD  ondansetron (ZOFRAN) 4 MG tablet Take 4 mg by mouth every 8 (eight) hours as needed for nausea or vomiting.    Historical Provider, MD  pantoprazole (PROTONIX) 20 MG tablet Take 2 tablets (40 mg total) by mouth daily. 08/02/14   Kathlen Mody, MD  potassium chloride SA (K-DUR,KLOR-CON) 20 MEQ tablet Take 2 tablets (40 mEq total) by mouth daily. 01/04/15   Donavan Foil, MD  rosuvastatin (CRESTOR) 20 MG tablet Take 1 tablet (20 mg total) by mouth daily. 08/02/14   Kathlen Mody, MD  sucralfate (CARAFATE) 1 G tablet Take 1 tablet (1 g total) by mouth 2 (two) times daily. Patient not taking: Reported on 02/02/2015 08/02/14   Kathlen Mody, MD  sulfaSALAzine (AZULFIDINE) 500 MG tablet Take 2 tablets (1,000 mg total) by mouth daily. 08/02/14   Kathlen Mody, MD  thiamine (VITAMIN B-1) 100 MG tablet Take 1 tablet (100 mg total) by mouth daily. 08/02/14   Kathlen Mody, MD  traZODone (DESYREL) 25 mg TABS tablet Take 0.5 tablets (25 mg total) by mouth at bedtime as needed for sleep. 08/02/14   Kathlen Mody, MD  venlafaxine (EFFEXOR) 25 MG tablet Take 1 tablet (25 mg total) by mouth 2 (two) times daily. Patient not taking: Reported on 02/02/2015 08/02/14   Kathlen Mody, MD   BP 108/81 mmHg  Pulse 100  Temp(Src) 99.3 F (37.4 C) (Oral)  Resp 17  Wt 76 lb (34.473 kg)  SpO2 99%  LMP 10/01/2010 Physical Exam  Constitutional: She is oriented to person, place, and time. She appears well-developed.  HENT:  Head: Normocephalic.  Small lac posterior to R ear healing without gross deformity   Eyes: EOM are normal.  Neck:  No midline c spine ttp   Cardiovascular: Intact distal pulses.   Tachycardia   Pulmonary/Chest: Effort normal. No respiratory distress. She exhibits tenderness.  Abdominal: Soft. She exhibits no distension. There is tenderness.   Tenderness palpation upper quadrants mainly epigastric and right sided no lower pain  Musculoskeletal: She exhibits tenderness.  Chest wall tenderness palpation sternal and right-sided.  Neurological: She is alert and oriented to person, place, and time. No cranial nerve deficit. She exhibits normal muscle tone.  Skin: Skin is warm.  Psychiatric: She has a normal mood and affect.  Vitals reviewed.   ED Course  Procedures (including critical care time) Labs Review Labs Reviewed  CBC - Abnormal; Notable for the following:    RBC 3.48 (*)    Hemoglobin 9.7 (*)    HCT 31.1 (*)    RDW 19.0 (*)    All other components within normal limits  URINALYSIS, ROUTINE W REFLEX MICROSCOPIC (NOT AT [facility]) - Abnormal; Notable for the following:    Color, Urine ORANGE (*)    APPearance CLOUDY (*)  Hgb urine dipstick MODERATE (*)    Bilirubin Urine SMALL (*)    Ketones, ur 15 (*)    Protein, ur 100 (*)    Leukocytes, UA MODERATE (*)    All other components within normal limits  URINE MICROSCOPIC-ADD ON - Abnormal; Notable for the following:    Squamous Epithelial / LPF MANY (*)    Bacteria, UA MANY (*)    All other components within normal limits  I-STAT CG4 LACTIC ACID, ED - Abnormal; Notable for the following:    Lactic Acid, Venous 7.59 (*)    All other components within normal limits  I-STAT CHEM 8, ED - Abnormal; Notable for the following:    Potassium 3.0 (*)    BUN 4 (*)    Glucose, Bld 128 (*)    Calcium, Ion 0.89 (*)    All other components within normal limits  GRAM STAIN  COMPREHENSIVE METABOLIC PANEL  I-STAT TROPOININ, ED  TYPE AND SCREEN    Imaging Review Ct Abdomen Pelvis Wo Contrast  02/28/2015   CLINICAL DATA:  c/o fall sat night 02/26/2015 , pt is c/o some slight neck pain but mostly c/o right rib pain Limited ROM Pt very sore to bilateral arms Hx of HTN Hx of neuropathy Hx of chrohn's  EXAM: CT CHEST, ABDOMEN AND PELVIS WITHOUT CONTRAST  This exam is limited for  trauma given lack of intravenous contrast.  TECHNIQUE: Multidetector CT imaging of the chest, abdomen and pelvis was performed following the standard protocol without IV contrast.  COMPARISON:  12/28/2014  FINDINGS: CT CHEST FINDINGS  Thoracic inlet:  No masses.  No adenopathy.  Mediastinum and hila: Heart normal in size and configuration. Dense coronary artery calcifications. Great vessels normal in caliber. No mediastinal or hilar masses or evidence of adenopathy.  Lungs and pleura: 2.6 mm nodular opacity in the right upper lobe, image 9, series 3. Lungs otherwise clear. No pleural effusion. No pneumothorax.  CT ABDOMEN AND PELVIS FINDINGS  Liver: There is lesion at the dome of the liver with a increased attenuation margin, currently measuring 2.3 cm by 1.8 cm, unchanged from prior exam. No other liver abnormality.  Spleen and pancreas:  Unremarkable.  Gallbladder and biliary tree: Gallbladder unremarkable. Common bile duct is dilated to 1 cm in the pancreatic head with abrupt tapering. This is unchanged from the prior study.  Adrenal glands:  No masses.  Kidneys, ureters, bladder: Small bilateral nonobstructing intrarenal stones. No renal masses. No hydronephrosis. Ureters normal course and caliber. Bladder is mostly decompressed but otherwise unremarkable.  Uterus and adnexa:  Unremarkable.  Lymph nodes:  No adenopathy.  Ascites:  None.  GI tract: Unremarkable. No evidence of a bowel hematoma or mesenteric hematoma.  Vascular: Atherosclerotic calcifications along the aorta and its branch vessels. No aneurysm.  MUSCULOSKELETAL  No fracture.  No osteoblastic or osteolytic lesions.  IMPRESSION: 1. Study limited due to lack of contrast which particularly limits evaluation for subtle injuries to solid viscera. 2. Allowing for the above limitation, no evidence of acute injury to the chest, abdomen or pelvis. 3. 2.6 mm nodule in the right upper lobe. This is likely scarring. If the patient is at high risk for  bronchogenic carcinoma, follow-up chest CT at 1 year is recommended. If the patient is at low risk, no follow-up is needed. This recommendation follows the consensus statement: Guidelines for Management of Small Pulmonary Nodules Detected on CT Scans: A Statement from the Fleischner Society as published in Radiology 2005; 237:395-400. 4.  There are chronic findings in the chest abdomen pelvis as detailed above.   Electronically Signed   By: Amie Portland M.D.   On: 02/28/2015 13:12   Ct Head Wo Contrast  02/28/2015   CLINICAL DATA:  Status Crissy Mccreadie fall.  EXAM: CT HEAD WITHOUT CONTRAST  TECHNIQUE: Contiguous axial images were obtained from the base of the skull through the vertex without intravenous contrast.  COMPARISON:  12/28/2014  FINDINGS: There is prominence of the sulci and ventricles consistent with brain atrophy. Mild diffuse low attenuation throughout the subcortical and periventricular white matter is noted consistent with brain atrophy. There is no evidence for acute intracranial hemorrhage, cortical infarct or mass. No abnormal extra-axial fluid collections identified. The paranasal sinuses are clear. The mastoid air cells are also clear. Normal appearance of the calvarium.  IMPRESSION: 1. No acute intracranial abnormalities. 2. Chronic microvascular disease and brain atrophy.   Electronically Signed   By: Signa Kell M.D.   On: 02/28/2015 13:02   Ct Chest Wo Contrast  02/28/2015   CLINICAL DATA:  c/o fall sat night 02/26/2015 , pt is c/o some slight neck pain but mostly c/o right rib pain Limited ROM Pt very sore to bilateral arms Hx of HTN Hx of neuropathy Hx of chrohn's  EXAM: CT CHEST, ABDOMEN AND PELVIS WITHOUT CONTRAST  This exam is limited for trauma given lack of intravenous contrast.  TECHNIQUE: Multidetector CT imaging of the chest, abdomen and pelvis was performed following the standard protocol without IV contrast.  COMPARISON:  12/28/2014  FINDINGS: CT CHEST FINDINGS  Thoracic inlet:  No  masses.  No adenopathy.  Mediastinum and hila: Heart normal in size and configuration. Dense coronary artery calcifications. Great vessels normal in caliber. No mediastinal or hilar masses or evidence of adenopathy.  Lungs and pleura: 2.6 mm nodular opacity in the right upper lobe, image 9, series 3. Lungs otherwise clear. No pleural effusion. No pneumothorax.  CT ABDOMEN AND PELVIS FINDINGS  Liver: There is lesion at the dome of the liver with a increased attenuation margin, currently measuring 2.3 cm by 1.8 cm, unchanged from prior exam. No other liver abnormality.  Spleen and pancreas:  Unremarkable.  Gallbladder and biliary tree: Gallbladder unremarkable. Common bile duct is dilated to 1 cm in the pancreatic head with abrupt tapering. This is unchanged from the prior study.  Adrenal glands:  No masses.  Kidneys, ureters, bladder: Small bilateral nonobstructing intrarenal stones. No renal masses. No hydronephrosis. Ureters normal course and caliber. Bladder is mostly decompressed but otherwise unremarkable.  Uterus and adnexa:  Unremarkable.  Lymph nodes:  No adenopathy.  Ascites:  None.  GI tract: Unremarkable. No evidence of a bowel hematoma or mesenteric hematoma.  Vascular: Atherosclerotic calcifications along the aorta and its branch vessels. No aneurysm.  MUSCULOSKELETAL  No fracture.  No osteoblastic or osteolytic lesions.  IMPRESSION: 1. Study limited due to lack of contrast which particularly limits evaluation for subtle injuries to solid viscera. 2. Allowing for the above limitation, no evidence of acute injury to the chest, abdomen or pelvis. 3. 2.6 mm nodule in the right upper lobe. This is likely scarring. If the patient is at high risk for bronchogenic carcinoma, follow-up chest CT at 1 year is recommended. If the patient is at low risk, no follow-up is needed. This recommendation follows the consensus statement: Guidelines for Management of Small Pulmonary Nodules Detected on CT Scans: A Statement  from the Fleischner Society as published in Radiology 2005; 237:395-400. 4. There are  chronic findings in the chest abdomen pelvis as detailed above.   Electronically Signed   By: Amie Portland M.D.   On: 02/28/2015 13:12     EKG Interpretation   Date/Time:  Monday Feb 28 2015 12:04:05 EDT Ventricular Rate:  125 PR Interval:  118 QRS Duration: 59 QT Interval:  270 QTC Calculation: 389 R Axis:   43 Text Interpretation:  Sinus tachycardia Paired ventricular premature  complexes Consider right atrial enlargement Low voltage, precordial leads  Anteroseptal infarct, old Borderline repolarization abnormality , new  Non-specific change in ST segment in diffuse leads Confirmed by Anitra Lauth   MD, Alphonzo Lemmings ([id number]) on 02/28/2015 12:13:54 PM      MDM  54 year old female with fall from standing several days ago. On arrival patient has a small laceration to the right parietal area. This is several days old. His superficial and healing. No indication for primary closure. Neurological exam is otherwise normal. Patient doesn't tenderness palpation of the right side of the chest and sternum. Also some right upper quadrant pain. Given patient fell into dresser, and given her frail status concern for possible viscus injury and multiple rib fractures. CTs were subsequently obtained. No traumatic injuries were noted. Also of note patient was hypertensive and tachycardic on arrival. Initial lactic acid 7.6. Discussed with patient who endorsed decreased by mouth intake. She was given IV fluids with improvement in heart rate and blood pressures. Admit hospitalist for lactic acidosis.    Final diagnoses:  None        Bridgett Larsson, MD 02/28/15 1342  Gwyneth Sprout, MD 03/02/15 2150

## 2015-02-28 NOTE — ED Notes (Signed)
Report attempt x 1 

## 2015-02-28 NOTE — ED Notes (Signed)
Patient transported to CT 

## 2015-02-28 NOTE — H&P (Signed)
[facility] History and Physical                                                                                    [name], is a 54 y.o. female  MRN: [id number]   DOB - 02/23/1961  Admit Date - 02/28/2015  Outpatient Primary MD for the patient is Shamleffer, Landry Mellow, MD  Referring MD: Post (Resident)/ ER  With History of -  Past Medical History  Diagnosis Date  . Glaucoma   . Diabetes mellitus without complication   . Hypertension   . Neuropathy   . Crohn disease   . Asthma   . Pulmonary embolus 05/24/2014      Past Surgical History  Procedure Laterality Date  . Cesarean section    . Esophagogastroduodenoscopy N/A 05/21/2014    Procedure: ESOPHAGOGASTRODUODENOSCOPY (EGD);  Surgeon: Petra Kuba, MD;  Location: [facility] ENDOSCOPY;  Service: Endoscopy;  Laterality: N/A;    in for   Chief Complaint  Patient presents with  . Fall     HPI This is a 58 female, his PE on Eliquis, Crohn's disease as well as chronic diarrhea NOT due to Crohns, chronic pain w/documented drug seeking behavior and multiple prescribing MDs, severe PCM, anemia, Hypothyroidism and hepatic steatosis. Presents to ER after reported fall 2 days prior. Fell and hit right side of head and body w/o LOC. Pt decided after fall she didn't feel well so opted not to eat. Reported had coffee ground emesis yesterday but no diarrhea. Has been having sharp pains in right breast and RUQ since fall. Called EMS, when arrived to ER BP was soft (92/65) and tachycardic (143). Hgb 9.2 which is at baseline. XR and CT w/o acute traumatic injuries. Lactic acid was elevated at 7.6 and with soft BP and report of poor intake PTA, EDP gave 2 L fluids with improved BP. After arrival to ER pt began having multiple episodes of dark diarrhea that was heme positive. Later she admitted to excessive NSAID use after the fall. Pt says that she tripped trying to hurry to the BR and that was what precipitated fall. K was 3.0 (chronic  hypokalemia), BUN 4 and SCr 0.8, TNI 0.01, platelets normal, mild transaminitis slightly worse than baseline.   Review of Systems   In addition to the HPI above,  No Fever-chills, myalgias or other constitutional symptoms No changes with Vision or hearing, new weakness, tingling, numbness in any extremity, No problems swallowing food or Liquids, indigestion/reflux No Cough or Shortness of Breath, palpitations, orthopnea or DOE No Abdominal pain, N/V; no melena or hematochezia, no dark tarry stools,  No dysuria, hematuria or flank pain No new skin rashes, lesions, masses or bruises, No recent weight gain or loss No polyuria, polydypsia or polyphagia,  *A full 10 point Review of Systems was done, except as stated above, all other Review of Systems were negative.  Social History History  Substance Use Topics  . Smoking status: Current Every Day Smoker -- 0.25 packs/day for 26 years    Types: Cigarettes  . Smokeless tobacco: Never Used  . Alcohol Use: No    Resides at: Private residence  Lives  with: Alone  Ambulatory status: w/o assistive devices   Family History Family History  Problem Relation Age of Onset  . Breast cancer Mother   . Heart disease Mother 55    CABG  . Diabetes Mother   . Heart disease Father 32    CABG  . Diabetes Father      Prior to Admission medications   Medication Sig Start Date End Date Taking? Authorizing Provider  apixaban (ELIQUIS) 5 MG TABS tablet Take 5 mg by mouth 2 (two) times daily.   Yes Historical Provider, MD  feeding supplement, RESOURCE BREEZE, (RESOURCE BREEZE) LIQD Take 1 Container by mouth 3 (three) times daily between meals. 08/02/14  Yes Kathlen Mody, MD  folic acid (FOLVITE) 1 MG tablet Take 1 tablet (1 mg total) by mouth daily. 08/02/14  Yes Kathlen Mody, MD  gabapentin (NEURONTIN) 800 MG tablet Take 1 tablet (800 mg total) by mouth 3 (three) times daily. 08/02/14  Yes Kathlen Mody, MD  HYDROmorphone (DILAUDID) 4 MG tablet Take  4 mg by mouth as needed for severe pain.  12/02/14  Yes Historical Provider, MD  levothyroxine (SYNTHROID, LEVOTHROID) 25 MCG tablet Take 1 tablet (25 mcg total) by mouth daily before breakfast. 08/02/14  Yes Kathlen Mody, MD  meclizine (ANTIVERT) 25 MG tablet Take 25 mg by mouth 3 (three) times daily as needed for dizziness.   Yes Historical Provider, MD  naproxen sodium (ANAPROX) 220 MG tablet Take 220 mg by mouth as needed (for pain).   Yes Historical Provider, MD  ondansetron (ZOFRAN) 4 MG tablet Take 4 mg by mouth every 8 (eight) hours as needed for nausea or vomiting.   Yes Historical Provider, MD  pantoprazole (PROTONIX) 20 MG tablet Take 2 tablets (40 mg total) by mouth daily. 08/02/14  Yes Kathlen Mody, MD  potassium chloride SA (K-DUR,KLOR-CON) 20 MEQ tablet Take 2 tablets (40 mEq total) by mouth daily. 01/04/15  Yes Adrian Blackwater Moding, MD  rosuvastatin (CRESTOR) 20 MG tablet Take 1 tablet (20 mg total) by mouth daily. 08/02/14  Yes Kathlen Mody, MD  sulfaSALAzine (AZULFIDINE) 500 MG tablet Take 2 tablets (1,000 mg total) by mouth daily. 08/02/14  Yes Kathlen Mody, MD  thiamine (VITAMIN B-1) 100 MG tablet Take 1 tablet (100 mg total) by mouth daily. 08/02/14  Yes Kathlen Mody, MD  traZODone (DESYREL) 25 mg TABS tablet Take 0.5 tablets (25 mg total) by mouth at bedtime as needed for sleep. 08/02/14  Yes Kathlen Mody, MD  venlafaxine (EFFEXOR) 25 MG tablet Take 1 tablet (25 mg total) by mouth 2 (two) times daily. 08/02/14  Yes Kathlen Mody, MD  alprazolam Prudy Feeler) 2 MG tablet Take 0.5 tablets (1 mg total) by mouth 3 (three) times daily as needed for sleep or anxiety. Patient not taking: Reported on 02/02/2015 08/02/14   Kathlen Mody, MD  aspirin EC 81 MG EC tablet Take 1 tablet (81 mg total) by mouth daily. Patient not taking: Reported on 02/02/2015 01/03/15   Adrian Blackwater Moding, MD  sucralfate (CARAFATE) 1 G tablet Take 1 tablet (1 g total) by mouth 2 (two) times daily. Patient not taking: Reported on 02/02/2015  08/02/14   Kathlen Mody, MD    Allergies  Allergen Reactions  . Iohexol Other (See Comments)    "severe burning" Patient has received Contrast in 2005 with 13 hour pre-medication, and had no reaction at that time  . Spiriva Handihaler [Tiotropium Bromide Monohydrate] Nausea And Vomiting  . Ativan [Lorazepam] Other (See Comments)  hallucinations  . Penicillins Hives    Physical Exam  Vitals  Blood pressure 114/97, pulse 100, temperature 99.3 F (37.4 C), temperature source Oral, resp. rate 18, weight 76 lb (34.473 kg), last menstrual period 10/01/2010, SpO2 99 %.   General:  In no acute distress, appears profoundly malnourished  Psych:  Normal affect, Denies Suicidal or Homicidal ideations, Awake Alert, Oriented X 3. Speech and thought patterns are clear and appropriate, no apparent short term memory deficits  Neuro:   No focal neurological deficits, CN II through XII intact, Strength 5/5 all 4 extremities, Sensation intact all 4 extremities.  ENT:  Ears and Eyes appear Normal, Conjunctivae clear, PER. Moist oral mucosa without erythema or exudates.  Neck:  Supple, No lymphadenopathy appreciated  Respiratory:  Symmetrical chest wall movement, Good air movement bilaterally, CTAB. Room Air; tender over right anterior chest wall with palpation  Cardiac:  RRR, No Murmurs, no LE edema noted, no JVD, No carotid bruits, peripheral pulses palpable at 2+  Abdomen:  Positive bowel sounds, Soft, Non tender, Non distended,  No masses appreciated, no obvious hepatosplenomegaly  Skin:  No Cyanosis, poor Skin Turgor and distal extremities cool to touch, No Skin Rash or Bruise.  Extremities: Symmetrical without obvious trauma or injury,  no effusions.  Data Review  CBC  Recent Labs Lab 02/28/15 1220 02/28/15 1243  WBC 10.5  --   HGB 9.7* 12.2  HCT 31.1* 36.0  PLT 250  --   MCV 89.4  --   MCH 27.9  --   MCHC 31.2  --   RDW 19.0*  --     Chemistries   Recent Labs Lab  02/28/15 1220 02/28/15 1243  NA 147* 145  K 3.2* 3.0*  CL 107 107  CO2 23  --   GLUCOSE 132* 128*  BUN 7 4*  CREATININE 0.99 0.80  CALCIUM 7.4*  --   AST 171*  --   ALT 63*  --   ALKPHOS 222*  --   BILITOT 1.0  --     estimated creatinine clearance is 42.8 mL/min (by C-G formula based on Cr of 0.8).  No results for input(s): TSH, T4TOTAL, T3FREE, THYROIDAB in the last 72 hours.  Invalid input(s): FREET3  Coagulation profile No results for input(s): INR, PROTIME in the last 168 hours.  No results for input(s): DDIMER in the last 72 hours.  Cardiac Enzymes No results for input(s): CKMB, TROPONINI, MYOGLOBIN in the last 168 hours.  Invalid input(s): CK  Invalid input(s): POCBNP  Urinalysis    Component Value Date/Time   COLORURINE ORANGE* 02/28/2015 1227   APPEARANCEUR CLOUDY* 02/28/2015 1227   LABSPEC 1.023 02/28/2015 1227   PHURINE 5.5 02/28/2015 1227   GLUCOSEU NEGATIVE 02/28/2015 1227   HGBUR MODERATE* 02/28/2015 1227   BILIRUBINUR SMALL* 02/28/2015 1227   KETONESUR 15* 02/28/2015 1227   PROTEINUR 100* 02/28/2015 1227   UROBILINOGEN 1.0 02/28/2015 1227   NITRITE NEGATIVE 02/28/2015 1227   LEUKOCYTESUR MODERATE* 02/28/2015 1227    Imaging results:   Ct Abdomen Pelvis Wo Contrast  02/28/2015   CLINICAL DATA:  c/o fall sat night 02/26/2015 , pt is c/o some slight neck pain but mostly c/o right rib pain Limited ROM Pt very sore to bilateral arms Hx of HTN Hx of neuropathy Hx of chrohn's  EXAM: CT CHEST, ABDOMEN AND PELVIS WITHOUT CONTRAST  This exam is limited for trauma given lack of intravenous contrast.  TECHNIQUE: Multidetector CT imaging of the chest, abdomen and pelvis  was performed following the standard protocol without IV contrast.  COMPARISON:  12/28/2014  FINDINGS: CT CHEST FINDINGS  Thoracic inlet:  No masses.  No adenopathy.  Mediastinum and hila: Heart normal in size and configuration. Dense coronary artery calcifications. Great vessels normal in  caliber. No mediastinal or hilar masses or evidence of adenopathy.  Lungs and pleura: 2.6 mm nodular opacity in the right upper lobe, image 9, series 3. Lungs otherwise clear. No pleural effusion. No pneumothorax.  CT ABDOMEN AND PELVIS FINDINGS  Liver: There is lesion at the dome of the liver with a increased attenuation margin, currently measuring 2.3 cm by 1.8 cm, unchanged from prior exam. No other liver abnormality.  Spleen and pancreas:  Unremarkable.  Gallbladder and biliary tree: Gallbladder unremarkable. Common bile duct is dilated to 1 cm in the pancreatic head with abrupt tapering. This is unchanged from the prior study.  Adrenal glands:  No masses.  Kidneys, ureters, bladder: Small bilateral nonobstructing intrarenal stones. No renal masses. No hydronephrosis. Ureters normal course and caliber. Bladder is mostly decompressed but otherwise unremarkable.  Uterus and adnexa:  Unremarkable.  Lymph nodes:  No adenopathy.  Ascites:  None.  GI tract: Unremarkable. No evidence of a bowel hematoma or mesenteric hematoma.  Vascular: Atherosclerotic calcifications along the aorta and its branch vessels. No aneurysm.  MUSCULOSKELETAL  No fracture.  No osteoblastic or osteolytic lesions.  IMPRESSION: 1. Study limited due to lack of contrast which particularly limits evaluation for subtle injuries to solid viscera. 2. Allowing for the above limitation, no evidence of acute injury to the chest, abdomen or pelvis. 3. 2.6 mm nodule in the right upper lobe. This is likely scarring. If the patient is at high risk for bronchogenic carcinoma, follow-up chest CT at 1 year is recommended. If the patient is at low risk, no follow-up is needed. This recommendation follows the consensus statement: Guidelines for Management of Small Pulmonary Nodules Detected on CT Scans: A Statement from the Fleischner Society as published in Radiology 2005; 237:395-400. 4. There are chronic findings in the chest abdomen pelvis as detailed above.    Electronically Signed   By: Amie Portland M.D.   On: 02/28/2015 13:12   Ct Head Wo Contrast  02/28/2015   CLINICAL DATA:  Status post fall.  EXAM: CT HEAD WITHOUT CONTRAST  TECHNIQUE: Contiguous axial images were obtained from the base of the skull through the vertex without intravenous contrast.  COMPARISON:  12/28/2014  FINDINGS: There is prominence of the sulci and ventricles consistent with brain atrophy. Mild diffuse low attenuation throughout the subcortical and periventricular white matter is noted consistent with brain atrophy. There is no evidence for acute intracranial hemorrhage, cortical infarct or mass. No abnormal extra-axial fluid collections identified. The paranasal sinuses are clear. The mastoid air cells are also clear. Normal appearance of the calvarium.  IMPRESSION: 1. No acute intracranial abnormalities. 2. Chronic microvascular disease and brain atrophy.   Electronically Signed   By: Signa Kell M.D.   On: 02/28/2015 13:02   Ct Chest Wo Contrast  02/28/2015   CLINICAL DATA:  c/o fall sat night 02/26/2015 , pt is c/o some slight neck pain but mostly c/o right rib pain Limited ROM Pt very sore to bilateral arms Hx of HTN Hx of neuropathy Hx of chrohn's  EXAM: CT CHEST, ABDOMEN AND PELVIS WITHOUT CONTRAST  This exam is limited for trauma given lack of intravenous contrast.  TECHNIQUE: Multidetector CT imaging of the chest, abdomen and pelvis was performed  following the standard protocol without IV contrast.  COMPARISON:  12/28/2014  FINDINGS: CT CHEST FINDINGS  Thoracic inlet:  No masses.  No adenopathy.  Mediastinum and hila: Heart normal in size and configuration. Dense coronary artery calcifications. Great vessels normal in caliber. No mediastinal or hilar masses or evidence of adenopathy.  Lungs and pleura: 2.6 mm nodular opacity in the right upper lobe, image 9, series 3. Lungs otherwise clear. No pleural effusion. No pneumothorax.  CT ABDOMEN AND PELVIS FINDINGS  Liver: There is  lesion at the dome of the liver with a increased attenuation margin, currently measuring 2.3 cm by 1.8 cm, unchanged from prior exam. No other liver abnormality.  Spleen and pancreas:  Unremarkable.  Gallbladder and biliary tree: Gallbladder unremarkable. Common bile duct is dilated to 1 cm in the pancreatic head with abrupt tapering. This is unchanged from the prior study.  Adrenal glands:  No masses.  Kidneys, ureters, bladder: Small bilateral nonobstructing intrarenal stones. No renal masses. No hydronephrosis. Ureters normal course and caliber. Bladder is mostly decompressed but otherwise unremarkable.  Uterus and adnexa:  Unremarkable.  Lymph nodes:  No adenopathy.  Ascites:  None.  GI tract: Unremarkable. No evidence of a bowel hematoma or mesenteric hematoma.  Vascular: Atherosclerotic calcifications along the aorta and its branch vessels. No aneurysm.  MUSCULOSKELETAL  No fracture.  No osteoblastic or osteolytic lesions.  IMPRESSION: 1. Study limited due to lack of contrast which particularly limits evaluation for subtle injuries to solid viscera. 2. Allowing for the above limitation, no evidence of acute injury to the chest, abdomen or pelvis. 3. 2.6 mm nodule in the right upper lobe. This is likely scarring. If the patient is at high risk for bronchogenic carcinoma, follow-up chest CT at 1 year is recommended. If the patient is at low risk, no follow-up is needed. This recommendation follows the consensus statement: Guidelines for Management of Small Pulmonary Nodules Detected on CT Scans: A Statement from the Fleischner Society as published in Radiology 2005; 237:395-400. 4. There are chronic findings in the chest abdomen pelvis as detailed above.   Electronically Signed   By: Amie Portland M.D.   On: 02/28/2015 13:12     EKG: (Independently reviewed)   Assessment & Plan  Active Problems:   Crohn's disease/Chronic diarrhea of unknown origin/  Heme positive stool -admit to SDU -consult [facility]/  Buccini on call -NPO until eval per GI -HOLD ELIQUIS -excessive NSAIDs so tx possible PUD- begin PPI drip -cycle CBC (SEE BELOW)    Metabolic acidosis/Dehydration -no abdominal pain on exam so doubt issue such as ischemic bowel -suspect profound DH as etiology -cycle lactic acid q 3 hrs until normalized -ck CK -doubt sepsis but ck PCT    DM2  -mildly hyperglycemic -not on meds PTA -HgbA1c was 4.9 in 2015 and has never been higher than 5.5 per our records so doubt this is a dx for her    HTN  -current BP soft and was NOT on meds PTA    Hypokalemia -appears to be chronic and likely 2/2 combo chronic diarrhea and malnutrition -oral replete    Normocytic anemia -Hgb stable and at baseline of 9-9. -cycle q 12hrs (with next collection at 6pm) in setting of heme positve stool    Chest pain, musculoskeletal/Fall at home -Cont preadmit narcs but no NSAIDs -DO NOT ESCALATE NARC MEDS!! This was d/w pt at length    Chronic pain -has documented drug seeking behaviors as ell as multiple prescribing MDs  per last admit note in April    Asthma -compensated w/o wheezing    Physical deconditioning/  Severe protein-calorie malnutrition -Nutrition eval -was on protein supplement drink PTA    History of pulmonary embolus (PE) -Eliquis on hold until etiology to heme positve stool/melena can be determined    Hepatic steatosis -mild elevation in LFTs from baseline in setting of profound DH -rpt today in in am    Hypothyroidism -cont Synthroid -ck TSH      DVT Prophylaxis: SCDs  Family Communication:   No family at bedside  Code Status:  Full code  Condition:  Stable  Discharge disposition: Anticipate discharge back to home once GI evaluation complete and patient without further signs of potential active GI bleeding  Time spent in minutes : 60      ELLIS,ALLISON L. ANP on 02/28/2015 at 3:30 PM  Between 7am to 7pm - Pager - [phone]  After 7pm go to [email or web address] -  password [email or web address]  And look for the night coverage person covering me after hours  [facility]

## 2015-02-28 NOTE — Consult Note (Signed)
Referring Provider: Dr. Randol Kern Primary Care Physician:  Lonzo Cloud, Landry Mellow, MD Primary Gastroenterologist:  Dr. Ewing Schlein  Reason for Consultation:  [name]  HPI: [name] is a 54 y.o. female admitted through the emergency room after a fall, where she struck the right side of her body on a dresser. Of note, she is on chronic anticoagulation with Eliquis because of a history of a pulmonary embolism about 9 months ago.  After coming to the emergency room and having a CT of the chest, abdomen, and pelvis which did not disclose any acute injury, the patient began to have multiple black stools which were liquid in character. The stool was Hemoccult-positive. She also had reported an episode of coffee ground emesis. The patient had a somewhat low blood pressure and some borderline tachycardia.  The dark stools have to be interpreted in light of the fact that she has possibly been on iron through her primary physician. This patient seems to be somewhat confused about her medications. It is listed with her outpatient medications on our office EMR, but it is not listed amongst her outpatient medications at time of admission and the patient thinks that she had not started taking it yet.  Of note, the patient is on both aspirin and naproxen as an outpatient. Theoretically, she is taking Protonix once daily but realistically, she states she can only take it once a day or it forms a "ball in her chest."  The patient did have endoscopic evaluation last December because of intermittent heme-positive stool, and that exam was essentially negative, just some minimal gastritis.   She was seen in consultation by [place] for intractable about 2 months ago, and she was also heme positive on that admission. At that time, her hemoglobin was 10.6. When checked at her primary physician's office last month, hemoglobin was 10.9.  The patient has a curious condition of possible inflammatory bowel disease such as  Crohn's colitis, based on colonoscopy by Dr. Reece Agar on 11/12/2002 at which time multiple mucosal ulcerations were noted on a background of apparently normal mucosa. She has been maintained on sulfasalazine.  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  The patient apparently does have a history of polysubstance abuse with pain medication seeking behaviors.  She has canceled or no showed for her to most recent appointments with her primary gastroenterologist, Dr. Ewing Schlein, in March and April of this year. His plan had been to do a colonoscopy "when she got stronger."    Past Medical History  Diagnosis Date  . Glaucoma   . Diabetes mellitus without complication   . Hypertension   . Neuropathy   . Crohn disease   . Asthma   . Pulmonary embolus 05/24/2014    Past Surgical History  Procedure Laterality Date  . Cesarean section    . Esophagogastroduodenoscopy N/A 05/21/2014    Procedure: ESOPHAGOGASTRODUODENOSCOPY (EGD);  Surgeon: Petra Kuba, MD;  Location: [facility] ENDOSCOPY;  Service: Endoscopy;  Laterality: N/A;    Prior to Admission medications   Medication Sig Start Date End Date Taking? Authorizing Provider  apixaban (ELIQUIS) 5 MG TABS tablet Take 5 mg by mouth 2 (two) times daily.   Yes Historical Provider, MD  feeding supplement, RESOURCE BREEZE, (RESOURCE BREEZE) LIQD Take 1 Container by mouth 3 (three) times daily between meals. 08/02/14  Yes Kathlen Mody, MD  folic acid (FOLVITE) 1  MG tablet Take 1 tablet (1 mg total) by mouth daily. 08/02/14  Yes Kathlen Mody, MD  gabapentin (NEURONTIN) 800 MG tablet Take 1 tablet (800 mg total) by mouth 3 (three) times daily. 08/02/14  Yes Kathlen Mody, MD  HYDROmorphone (DILAUDID) 4 MG tablet Take 4 mg by mouth as needed for severe pain.  12/02/14  Yes Historical Provider, MD  levothyroxine (SYNTHROID,  LEVOTHROID) 25 MCG tablet Take 1 tablet (25 mcg total) by mouth daily before breakfast. 08/02/14  Yes Kathlen Mody, MD  meclizine (ANTIVERT) 25 MG tablet Take 25 mg by mouth 3 (three) times daily as needed for dizziness.   Yes Historical Provider, MD  naproxen sodium (ANAPROX) 220 MG tablet Take 220 mg by mouth as needed (for pain).   Yes Historical Provider, MD  ondansetron (ZOFRAN) 4 MG tablet Take 4 mg by mouth every 8 (eight) hours as needed for nausea or vomiting.   Yes Historical Provider, MD  pantoprazole (PROTONIX) 20 MG tablet Take 2 tablets (40 mg total) by mouth daily. 08/02/14  Yes Kathlen Mody, MD  potassium chloride SA (K-DUR,KLOR-CON) 20 MEQ tablet Take 2 tablets (40 mEq total) by mouth daily. 01/04/15  Yes Adrian Blackwater Moding, MD  rosuvastatin (CRESTOR) 20 MG tablet Take 1 tablet (20 mg total) by mouth daily. 08/02/14  Yes Kathlen Mody, MD  sulfaSALAzine (AZULFIDINE) 500 MG tablet Take 2 tablets (1,000 mg total) by mouth daily. 08/02/14  Yes Kathlen Mody, MD  thiamine (VITAMIN B-1) 100 MG tablet Take 1 tablet (100 mg total) by mouth daily. 08/02/14  Yes Kathlen Mody, MD  traZODone (DESYREL) 25 mg TABS tablet Take 0.5 tablets (25 mg total) by mouth at bedtime as needed for sleep. 08/02/14  Yes Kathlen Mody, MD  venlafaxine (EFFEXOR) 25 MG tablet Take 1 tablet (25 mg total) by mouth 2 (two) times daily. 08/02/14  Yes Kathlen Mody, MD  alprazolam Prudy Feeler) 2 MG tablet Take 0.5 tablets (1 mg total) by mouth 3 (three) times daily as needed for sleep or anxiety. Patient not taking: Reported on 02/02/2015 08/02/14   Kathlen Mody, MD  aspirin EC 81 MG EC tablet Take 1 tablet (81 mg total) by mouth daily. Patient not taking: Reported on 02/02/2015 01/03/15   Adrian Blackwater Moding, MD  sucralfate (CARAFATE) 1 G tablet Take 1 tablet (1 g total) by mouth 2 (two) times daily. Patient not taking: Reported on 02/02/2015 08/02/14   Kathlen Mody, MD    Current Facility-Administered Medications  Medication Dose Route Frequency  Provider Last Rate Last Dose  . 0.9 %  sodium chloride infusion   Intravenous Continuous Russella Dar, NP      . pantoprazole (PROTONIX) 80 mg in sodium chloride 0.9 % 100 mL IVPB  80 mg Intravenous Once Russella Dar, NP      . pantoprazole (PROTONIX) 80 mg in sodium chloride 0.9 % 250 mL (0.32 mg/mL) infusion  8 mg/hr Intravenous Continuous Russella Dar, NP      . potassium chloride 10 mEq in 100 mL IVPB  10 mEq Intravenous Q1 Hr x 4 Russella Dar, NP       Current Outpatient Prescriptions  Medication Sig Dispense Refill  . apixaban (ELIQUIS) 5 MG TABS tablet Take 5 mg by mouth 2 (two) times daily.    . feeding supplement, RESOURCE BREEZE, (RESOURCE BREEZE) LIQD Take 1 Container by mouth 3 (three) times daily between meals.  0  . folic acid (FOLVITE) 1 MG tablet Take 1 tablet (1  mg total) by mouth daily. 30 tablet 0  . gabapentin (NEURONTIN) 800 MG tablet Take 1 tablet (800 mg total) by mouth 3 (three) times daily. 90 tablet 0  . HYDROmorphone (DILAUDID) 4 MG tablet Take 4 mg by mouth as needed for severe pain.   0  . levothyroxine (SYNTHROID, LEVOTHROID) 25 MCG tablet Take 1 tablet (25 mcg total) by mouth daily before breakfast. 30 tablet 0  . meclizine (ANTIVERT) 25 MG tablet Take 25 mg by mouth 3 (three) times daily as needed for dizziness.    . naproxen sodium (ANAPROX) 220 MG tablet Take 220 mg by mouth as needed (for pain).    . ondansetron (ZOFRAN) 4 MG tablet Take 4 mg by mouth every 8 (eight) hours as needed for nausea or vomiting.    . pantoprazole (PROTONIX) 20 MG tablet Take 2 tablets (40 mg total) by mouth daily. 30 tablet 1  . potassium chloride SA (K-DUR,KLOR-CON) 20 MEQ tablet Take 2 tablets (40 mEq total) by mouth daily. 30 tablet 0  . rosuvastatin (CRESTOR) 20 MG tablet Take 1 tablet (20 mg total) by mouth daily. 30 tablet 0  . sulfaSALAzine (AZULFIDINE) 500 MG tablet Take 2 tablets (1,000 mg total) by mouth daily. 30 tablet 0  . thiamine (VITAMIN B-1) 100 MG tablet  Take 1 tablet (100 mg total) by mouth daily. 30 tablet 0  . traZODone (DESYREL) 25 mg TABS tablet Take 0.5 tablets (25 mg total) by mouth at bedtime as needed for sleep. 30 tablet 0  . venlafaxine (EFFEXOR) 25 MG tablet Take 1 tablet (25 mg total) by mouth 2 (two) times daily. 30 tablet 0  . alprazolam (XANAX) 2 MG tablet Take 0.5 tablets (1 mg total) by mouth 3 (three) times daily as needed for sleep or anxiety. (Patient not taking: Reported on 02/02/2015) 10 tablet 0  . aspirin EC 81 MG EC tablet Take 1 tablet (81 mg total) by mouth daily. (Patient not taking: Reported on 02/02/2015) 30 tablet 0  . sucralfate (CARAFATE) 1 G tablet Take 1 tablet (1 g total) by mouth 2 (two) times daily. (Patient not taking: Reported on 02/02/2015) 60 tablet 0    Allergies as of 02/28/2015 - Review Complete 02/28/2015  Allergen Reaction Noted  . Iohexol Other (See Comments) 11/01/2003  . Spiriva handihaler [tiotropium bromide monohydrate] Nausea And Vomiting 07/26/2014  . Ativan [lorazepam] Other (See Comments) 12/28/2014  . Penicillins Hives 05/12/2007    Family History  Problem Relation Age of Onset  . Breast cancer Mother   . Heart disease Mother 74    CABG  . Diabetes Mother   . Heart disease Father 25    CABG  . Diabetes Father     History   Social History  . Marital Status: Divorced    Spouse Name: N/A  . Number of Children: N/A  . Years of Education: N/A   Occupational History  . Not on file.   Social History Main Topics  . Smoking status: Current Every Day Smoker -- 0.25 packs/day for 26 years    Types: Cigarettes  . Smokeless tobacco: Never Used  . Alcohol Use: No  . Drug Use: No  . Sexual Activity: Not on file   Other Topics Concern  . Not on file   Social History Narrative    Review of Systems: Negative for anorexia, abdominal pain, or chronic diarrhea by her report. However, she is not a good historian. She has not noticed dark stools like this prior  to today.  Physical  Exam: Vital signs in last 24 hours: Temp:  [99.3 F (37.4 C)] 99.3 F (37.4 C) (05/30 1208) Pulse Rate:  [100-143] 129 (05/30 1700) Resp:  [17-29] 28 (05/30 1700) BP: (90-114)/(65-97) 103/76 mmHg (05/30 1700) SpO2:  [95 %-99 %] 98 % (05/30 1700) Weight:  [34.473 kg (76 lb)] 34.473 kg (76 lb) (05/30 1208)   General:  This is a pleasant, light-skinned African-American female in no acute distress. She does appear, however, chronically malnourished.  Head:  Normocephalic. Small laceration behind her ear noted by ER physician.  Eyes:  Sclera clear, no icterus.   Conjunctiva pink. Lungs: The patient does not cooperate with deep breathing. For what it is worth, she is in no respiratory distress whatsoever, and the lung sounds are clear grossly. The chest is tender to even very light palpation, in a somewhat nonphysiologic fashion. Heart: Heart sounds are distant but no murmur or arrhythmia is appreciated. Abdomen:   scaphoid, mildly tympanitic, no mass effect or tenderness  Rectal:  there is a moderate amount of liquid jet black, non-malodorous stool in the diaper. Rectal exam shows normal sphincter tone and an empty rectal ampulla with liquid black stool residue coating the finger.    Msk:   Symmetrical without gross deformities. Pulses:  Normal radial pulse is noted. Extremities:   Without clubbing, cyanosis, or edema. Neurologic:  Alert and coherent;  grossly normal neurologically. Skin:  Intact without significant lesions or rashes. Psych:   Alert and cooperative. Normal mood, but slightly distant affect Intake/Output from previous day:   Intake/Output this shift:    Lab Results:  Recent Labs  02/28/15 1220 02/28/15 1243  WBC 10.5  --   HGB 9.7* 12.2  HCT 31.1* 36.0  PLT 250  --    BMET  Recent Labs  02/28/15 1220 02/28/15 1243 02/28/15 1611  NA 147* 145 146*  K 3.2* 3.0* 3.2*  CL 107 107 112*  CO2 23  --  26  GLUCOSE 132* 128* 113*  BUN 7 4* 6  CREATININE 0.99 0.80  0.92  CALCIUM 7.4*  --  6.5*   LFT  Recent Labs  02/28/15 1611  PROT 4.7*  ALBUMIN 1.8*  AST 143*  ALT 55*  ALKPHOS 207*  BILITOT 0.8   PT/INR No results for input(s): LABPROT, INR in the last 72 hours.  Studies/Results: Ct Abdomen Pelvis Wo Contrast  02/28/2015   CLINICAL DATA:  c/o fall sat night 02/26/2015 , pt is c/o some slight neck pain but mostly c/o right rib pain Limited ROM Pt very sore to bilateral arms Hx of HTN Hx of neuropathy Hx of chrohn's  EXAM: CT CHEST, ABDOMEN AND PELVIS WITHOUT CONTRAST  This exam is limited for trauma given lack of intravenous contrast.  TECHNIQUE: Multidetector CT imaging of the chest, abdomen and pelvis was performed following the standard protocol without IV contrast.  COMPARISON:  12/28/2014  FINDINGS: CT CHEST FINDINGS  Thoracic inlet:  No masses.  No adenopathy.  Mediastinum and hila: Heart normal in size and configuration. Dense coronary artery calcifications. Great vessels normal in caliber. No mediastinal or hilar masses or evidence of adenopathy.  Lungs and pleura: 2.6 mm nodular opacity in the right upper lobe, image 9, series 3. Lungs otherwise clear. No pleural effusion. No pneumothorax.  CT ABDOMEN AND PELVIS FINDINGS  Liver: There is lesion at the dome of the liver with a increased attenuation margin, currently measuring 2.3 cm by 1.8 cm, unchanged from prior exam.  No other liver abnormality.  Spleen and pancreas:  Unremarkable.  Gallbladder and biliary tree: Gallbladder unremarkable. Common bile duct is dilated to 1 cm in the pancreatic head with abrupt tapering. This is unchanged from the prior study.  Adrenal glands:  No masses.  Kidneys, ureters, bladder: Small bilateral nonobstructing intrarenal stones. No renal masses. No hydronephrosis. Ureters normal course and caliber. Bladder is mostly decompressed but otherwise unremarkable.  Uterus and adnexa:  Unremarkable.  Lymph nodes:  No adenopathy.  Ascites:  None.  GI tract: Unremarkable. No  evidence of a bowel hematoma or mesenteric hematoma.  Vascular: Atherosclerotic calcifications along the aorta and its branch vessels. No aneurysm.  MUSCULOSKELETAL  No fracture.  No osteoblastic or osteolytic lesions.  IMPRESSION: 1. Study limited due to lack of contrast which particularly limits evaluation for subtle injuries to solid viscera. 2. Allowing for the above limitation, no evidence of acute injury to the chest, abdomen or pelvis. 3. 2.6 mm nodule in the right upper lobe. This is likely scarring. If the patient is at high risk for bronchogenic carcinoma, follow-up chest CT at 1 year is recommended. If the patient is at low risk, no follow-up is needed. This recommendation follows the consensus statement: Guidelines for Management of Small Pulmonary Nodules Detected on CT Scans: A Statement from the Fleischner Society as published in Radiology 2005; 237:395-400. 4. There are chronic findings in the chest abdomen pelvis as detailed above.   Electronically Signed   By: Amie Portland M.D.   On: 02/28/2015 13:12   Ct Head Wo Contrast  02/28/2015   CLINICAL DATA:  Status post fall.  EXAM: CT HEAD WITHOUT CONTRAST  TECHNIQUE: Contiguous axial images were obtained from the base of the skull through the vertex without intravenous contrast.  COMPARISON:  12/28/2014  FINDINGS: There is prominence of the sulci and ventricles consistent with brain atrophy. Mild diffuse low attenuation throughout the subcortical and periventricular white matter is noted consistent with brain atrophy. There is no evidence for acute intracranial hemorrhage, cortical infarct or mass. No abnormal extra-axial fluid collections identified. The paranasal sinuses are clear. The mastoid air cells are also clear. Normal appearance of the calvarium.  IMPRESSION: 1. No acute intracranial abnormalities. 2. Chronic microvascular disease and brain atrophy.   Electronically Signed   By: Signa Kell M.D.   On: 02/28/2015 13:02   Ct Chest Wo  Contrast  02/28/2015   CLINICAL DATA:  c/o fall sat night 02/26/2015 , pt is c/o some slight neck pain but mostly c/o right rib pain Limited ROM Pt very sore to bilateral arms Hx of HTN Hx of neuropathy Hx of chrohn's  EXAM: CT CHEST, ABDOMEN AND PELVIS WITHOUT CONTRAST  This exam is limited for trauma given lack of intravenous contrast.  TECHNIQUE: Multidetector CT imaging of the chest, abdomen and pelvis was performed following the standard protocol without IV contrast.  COMPARISON:  12/28/2014  FINDINGS: CT CHEST FINDINGS  Thoracic inlet:  No masses.  No adenopathy.  Mediastinum and hila: Heart normal in size and configuration. Dense coronary artery calcifications. Great vessels normal in caliber. No mediastinal or hilar masses or evidence of adenopathy.  Lungs and pleura: 2.6 mm nodular opacity in the right upper lobe, image 9, series 3. Lungs otherwise clear. No pleural effusion. No pneumothorax.  CT ABDOMEN AND PELVIS FINDINGS  Liver: There is lesion at the dome of the liver with a increased attenuation margin, currently measuring 2.3 cm by 1.8 cm, unchanged from prior exam. No other  liver abnormality.  Spleen and pancreas:  Unremarkable.  Gallbladder and biliary tree: Gallbladder unremarkable. Common bile duct is dilated to 1 cm in the pancreatic head with abrupt tapering. This is unchanged from the prior study.  Adrenal glands:  No masses.  Kidneys, ureters, bladder: Small bilateral nonobstructing intrarenal stones. No renal masses. No hydronephrosis. Ureters normal course and caliber. Bladder is mostly decompressed but otherwise unremarkable.  Uterus and adnexa:  Unremarkable.  Lymph nodes:  No adenopathy.  Ascites:  None.  GI tract: Unremarkable. No evidence of a bowel hematoma or mesenteric hematoma.  Vascular: Atherosclerotic calcifications along the aorta and its branch vessels. No aneurysm.  MUSCULOSKELETAL  No fracture.  No osteoblastic or osteolytic lesions.  IMPRESSION: 1. Study limited due to lack  of contrast which particularly limits evaluation for subtle injuries to solid viscera. 2. Allowing for the above limitation, no evidence of acute injury to the chest, abdomen or pelvis. 3. 2.6 mm nodule in the right upper lobe. This is likely scarring. If the patient is at high risk for bronchogenic carcinoma, follow-up chest CT at 1 year is recommended. If the patient is at low risk, no follow-up is needed. This recommendation follows the consensus statement: Guidelines for Management of Small Pulmonary Nodules Detected on CT Scans: A Statement from the Fleischner Society as published in Radiology 2005; 237:395-400. 4. There are chronic findings in the chest abdomen pelvis as detailed above.   Electronically Signed   By: Amie Portland M.D.   On: 02/28/2015 13:12    Impression: 1,  black, heme positive stool which appears more consistent with iron exposure than melana. The stool is not tarry in character, nor does it have the characteristic odor of melanotic, and the patient's BUN is very low, at 6. Therefore, despite her anticoagulation and exposure to aspirin and naproxen, I don't think this patient is having an upper GI bleed. Her hemoglobin is not much different from baseline. 2. Possible history of previous Crohn's colitis without recent colonoscopic evaluation 3. Elevated liver chemistries in a pattern suggestive of alcohol exposure   Plan: 1.  clear liquid diet, MiraLAX, and monitor hemoglobin.  2.  I don't feel this patient needs urgent endoscopy or colonoscopy. However, it might be reasonable to do those exams on this admission given the patient's outpatient unreliability with follow-up and logistical impediments to getting her procedures actually performed in the outpatient setting.    LOS: 0 days   Hamdi Vari V  02/28/2015, 5:11 PM   Pager [phone] If no answer or after 5 PM call [phone]

## 2015-03-01 DIAGNOSIS — N189 Chronic kidney disease, unspecified: Secondary | ICD-10-CM

## 2015-03-01 DIAGNOSIS — E872 Acidosis: Secondary | ICD-10-CM

## 2015-03-01 DIAGNOSIS — I10 Essential (primary) hypertension: Secondary | ICD-10-CM

## 2015-03-01 DIAGNOSIS — K760 Fatty (change of) liver, not elsewhere classified: Secondary | ICD-10-CM

## 2015-03-01 DIAGNOSIS — E1122 Type 2 diabetes mellitus with diabetic chronic kidney disease: Secondary | ICD-10-CM

## 2015-03-01 DIAGNOSIS — G8929 Other chronic pain: Secondary | ICD-10-CM

## 2015-03-01 DIAGNOSIS — D649 Anemia, unspecified: Secondary | ICD-10-CM

## 2015-03-01 LAB — CBC
HCT: 25.3 % — ABNORMAL LOW (ref 36.0–46.0)
HEMATOCRIT: 24.2 % — AB (ref 36.0–46.0)
HEMATOCRIT: 24.7 % — AB (ref 36.0–46.0)
HEMOGLOBIN: 7.9 g/dL — AB (ref 12.0–15.0)
HEMOGLOBIN: 7.8 g/dL — AB (ref 12.0–15.0)
Hemoglobin: 7.7 g/dL — ABNORMAL LOW (ref 12.0–15.0)
MCH: 28.3 pg (ref 26.0–34.0)
MCH: 28.5 pg (ref 26.0–34.0)
MCH: 28.1 pg (ref 26.0–34.0)
MCHC: 31.8 g/dL (ref 30.0–36.0)
MCHC: 32 g/dL (ref 30.0–36.0)
MCHC: 30.8 g/dL (ref 30.0–36.0)
MCV: 89 fL (ref 78.0–100.0)
MCV: 89.2 fL (ref 78.0–100.0)
MCV: 91 fL (ref 78.0–100.0)
PLATELETS: 202 10*3/uL (ref 150–400)
PLATELETS: 204 10*3/uL (ref 150–400)
Platelets: 210 10*3/uL (ref 150–400)
RBC: 2.72 MIL/uL — ABNORMAL LOW (ref 3.87–5.11)
RBC: 2.77 MIL/uL — ABNORMAL LOW (ref 3.87–5.11)
RBC: 2.78 MIL/uL — AB (ref 3.87–5.11)
RDW: 19.6 % — AB (ref 11.5–15.5)
RDW: 19.5 % — ABNORMAL HIGH (ref 11.5–15.5)
RDW: 19.3 % — ABNORMAL HIGH (ref 11.5–15.5)
WBC: 10.6 10*3/uL — AB (ref 4.0–10.5)
WBC: 10.4 10*3/uL (ref 4.0–10.5)
WBC: 10.6 10*3/uL — AB (ref 4.0–10.5)

## 2015-03-01 LAB — COMPREHENSIVE METABOLIC PANEL
ALT: 37 U/L (ref 14–54)
AST: 69 U/L — ABNORMAL HIGH (ref 15–41)
Albumin: 1.5 g/dL — ABNORMAL LOW (ref 3.5–5.0)
Alkaline Phosphatase: 157 U/L — ABNORMAL HIGH (ref 38–126)
Anion gap: 4 — ABNORMAL LOW (ref 5–15)
BILIRUBIN TOTAL: 0.6 mg/dL (ref 0.3–1.2)
BUN: 8 mg/dL (ref 6–20)
CHLORIDE: 120 mmol/L — AB (ref 101–111)
CO2: 22 mmol/L (ref 22–32)
CREATININE: 0.78 mg/dL (ref 0.44–1.00)
Calcium: 6 mg/dL — CL (ref 8.9–10.3)
GFR calc Af Amer: >60 mL/min (ref >60)
Glucose, Bld: 66 mg/dL (ref 65–99)
Potassium: 2.8 mmol/L — ABNORMAL LOW (ref 3.5–5.1)
Sodium: 146 mmol/L — ABNORMAL HIGH (ref 135–145)
Total Protein: 3.9 g/dL — ABNORMAL LOW (ref 6.5–8.1)

## 2015-03-01 LAB — LACTIC ACID, PLASMA: Lactic Acid, Venous: 2.4 mmol/L (ref 0.5–2.0)

## 2015-03-01 LAB — PROTIME-INR
INR: 1.34 (ref 0.00–1.49)
Prothrombin Time: 16.7 seconds — ABNORMAL HIGH (ref 11.6–15.2)

## 2015-03-01 LAB — APTT: APTT: 30 s (ref 24–37)

## 2015-03-01 MED ORDER — SODIUM CHLORIDE 0.9 % IV BOLUS (SEPSIS)
500.0000 mL | Freq: Once | INTRAVENOUS | Status: AC
  Administered 2015-03-01: 500 mL via INTRAVENOUS

## 2015-03-01 MED ORDER — PEG 3350-KCL-NA BICARB-NACL 420 G PO SOLR
4000.0000 mL | Freq: Once | ORAL | Status: AC
  Administered 2015-03-01: 4000 mL via ORAL
  Filled 2015-03-01: qty 4000

## 2015-03-01 MED ORDER — POTASSIUM CHLORIDE CRYS ER 10 MEQ PO TBCR
60.0000 meq | EXTENDED_RELEASE_TABLET | Freq: Four times a day (QID) | ORAL | Status: AC
  Administered 2015-03-01 (×3): 60 meq via ORAL
  Filled 2015-03-01: qty 3
  Filled 2015-03-01 (×2): qty 6

## 2015-03-01 MED ORDER — SODIUM CHLORIDE 0.9 % IV SOLN
INTRAVENOUS | Status: DC
  Administered 2015-03-02: 13:00:00 via INTRAVENOUS

## 2015-03-01 MED ORDER — MAGNESIUM SULFATE 2 GM/50ML IV SOLN
2.0000 g | Freq: Four times a day (QID) | INTRAVENOUS | Status: AC
  Administered 2015-03-01 (×2): 2 g via INTRAVENOUS
  Filled 2015-03-01 (×2): qty 50

## 2015-03-01 NOTE — Care Management Note (Signed)
Case Management Note  Patient Details  Name: [name] MRN: [id number] Date of Birth: 12/08/1960  Subjective/Objective:                 From home with son admitted with metabolic acidosis.  Action/Plan: Return to home when medically stable. CM  To f/u with d/c disposition.  Expected Discharge Date:                  Expected Discharge Plan:  Home w Home Health Services  In-House Referral:     Discharge planning Services  CM Consult  Post Acute Care Choice:    Choice offered to:     DME Arranged:    DME Agency:     HH Arranged:    HH Agency:     Status of Service:  In process, will continue to follow  Medicare Important Message Given:    Date Medicare IM Given:    Medicare IM give by:    Date Additional Medicare IM Given:    Additional Medicare Important Message give by:     If discussed at Long Length of Stay Meetings, dates discussed:    Additional Comments:    Fayrene Fearing (son),  [phone] . Pt states has rolling walker and cane @ home. Gae Gallop [place], RN 03/01/2015, 10:09 AM

## 2015-03-01 NOTE — Progress Notes (Signed)
Pt B/P dropped, pt remains asymptomatic. MD called and new orders received. Will implement and continue to monitor.    03/01/15 1130  Vitals  BP (!) 77/64 mmHg  MAP (mmHg) 67  BP Location Right Arm  BP Method Automatic  Patient Position (if appropriate) Lying  Pulse Rate 89  Pulse Rate Source Monitor  ECG Heart Rate (!) 106  Resp (!) 21  Oxygen Therapy  SpO2 97 %

## 2015-03-01 NOTE — Progress Notes (Signed)
CRITICAL VALUE ALERT  Critical value received:  Ca 6.0  Date of notification:  03/01/2015  Time of notification:  0720  Critical value read back:Yes.    Nurse who received alert:  Raymon Mutton RN  MD notified (1st page):  Dr. Arthor Captain  Time of first page:  0730  MD notified (2nd page):  Time of second page:  Responding MD:  Dr. Arthor Captain  Time MD responded:  [phone]

## 2015-03-01 NOTE — Progress Notes (Signed)
[facility] Progress Note  Subjective: No specific complaints this morning. Anticipating colonoscopy.  Objective: Vital signs in last 24 hours: Temp:  [98.4 F (36.9 C)-99.3 F (37.4 C)] 98.4 F (36.9 C) (05/31 0308) Pulse Rate:  [88-143] 88 (05/31 0811) Resp:  [12-30] 18 (05/31 0811) BP: (90-114)/(65-97) 92/68 mmHg (05/31 0811) SpO2:  [92 %-100 %] 99 % (05/31 0811) Weight:  [34.473 kg (76 lb)-39.7 kg (87 lb 8.4 oz)] 39.7 kg (87 lb 8.4 oz) (05/31 0304) Weight change:    PE: Abdomen soft nondistended with normoactive bowel sounds. No hepatosplenomegaly mass or guarding  Lab Results: Results for orders placed or performed during the hospital encounter of 02/28/15 (from the past 24 hour(s))  CBC     Status: Abnormal   Collection Time: 02/28/15 12:20 PM  Result Value Ref Range   WBC 10.5 4.0 - 10.5 K/uL   RBC 3.48 (L) 3.87 - 5.11 MIL/uL   Hemoglobin 9.7 (L) 12.0 - 15.0 g/dL   HCT 91.4 (L) 78.2 - 95.6 %   MCV 89.4 78.0 - 100.0 fL   MCH 27.9 26.0 - 34.0 pg   MCHC 31.2 30.0 - 36.0 g/dL   RDW 21.3 (H) 08.6 - 57.8 %   Platelets 250 150 - 400 K/uL  Comprehensive metabolic panel     Status: Abnormal   Collection Time: 02/28/15 12:20 PM  Result Value Ref Range   Sodium 147 (H) 135 - 145 mmol/L   Potassium 3.2 (L) 3.5 - 5.1 mmol/L   Chloride 107 101 - 111 mmol/L   CO2 23 22 - 32 mmol/L   Glucose, Bld 132 (H) 65 - 99 mg/dL   BUN 7 6 - 20 mg/dL   Creatinine, Ser 4.69 0.44 - 1.00 mg/dL   Calcium 7.4 (L) 8.9 - 10.3 mg/dL   Total Protein 5.4 (L) 6.5 - 8.1 g/dL   Albumin 2.0 (L) 3.5 - 5.0 g/dL   AST 629 (H) 15 - 41 U/L   ALT 63 (H) 14 - 54 U/L   Alkaline Phosphatase 222 (H) 38 - 126 U/L   Total Bilirubin 1.0 0.3 - 1.2 mg/dL   GFR calc non Af Amer >60 >60 mL/min   GFR calc Af Amer >60 >60 mL/min   Anion gap 17 (H) 5 - 15  Urinalysis, Routine w reflex microscopic     Status: Abnormal   Collection Time: 02/28/15 12:27 PM  Result Value Ref Range   Color, Urine ORANGE (A)  YELLOW   APPearance CLOUDY (A) CLEAR   Specific Gravity, Urine 1.023 1.005 - 1.030   pH 5.5 5.0 - 8.0   Glucose, UA NEGATIVE NEGATIVE mg/dL   Hgb urine dipstick MODERATE (A) NEGATIVE   Bilirubin Urine SMALL (A) NEGATIVE   Ketones, ur 15 (A) NEGATIVE mg/dL   Protein, ur 528 (A) NEGATIVE mg/dL   Urobilinogen, UA 1.0 0.0 - 1.0 mg/dL   Nitrite NEGATIVE NEGATIVE   Leukocytes, UA MODERATE (A) NEGATIVE  Gram stain     Status: None   Collection Time: 02/28/15 12:27 PM  Result Value Ref Range   Specimen Description URINE, RANDOM    Special Requests NONE    Gram Stain      WBC PRESENT, PREDOMINANTLY PMN Multiple bacterial morphotypes present, none predominant. Suggest appropriate recollection if clinically indicated. CYTOSPIN URINE    Report Status 02/28/2015 FINAL   Urine microscopic-add on     Status: Abnormal   Collection Time: 02/28/15 12:27 PM  Result Value Ref Range   Squamous  Epithelial / LPF MANY (A) RARE   WBC, UA 11-20 <3 WBC/hpf   RBC / HPF 3-6 <3 RBC/hpf   Bacteria, UA MANY (A) RARE   Urine-Other FEW YEAST   Urine rapid drug screen (hosp performed)not at [facility]     Status: Abnormal   Collection Time: 02/28/15 12:27 PM  Result Value Ref Range   Opiates POSITIVE (A) NONE DETECTED   Cocaine NONE DETECTED NONE DETECTED   Benzodiazepines POSITIVE (A) NONE DETECTED   Amphetamines NONE DETECTED NONE DETECTED   Tetrahydrocannabinol POSITIVE (A) NONE DETECTED   Barbiturates NONE DETECTED NONE DETECTED  I-Stat Troponin, ED (not at [facility])     Status: None   Collection Time: 02/28/15 12:41 PM  Result Value Ref Range   Troponin i, poc 0.01 0.00 - 0.08 ng/mL   Comment 3          I-Stat CG4 Lactic Acid, ED     Status: Abnormal   Collection Time: 02/28/15 12:43 PM  Result Value Ref Range   Lactic Acid, Venous 7.59 (HH) 0.5 - 2.0 mmol/L   Comment NOTIFIED PHYSICIAN   I-Stat Chem 8, ED     Status: Abnormal   Collection Time: 02/28/15 12:43 PM  Result Value Ref Range   Sodium 145 135  - 145 mmol/L   Potassium 3.0 (L) 3.5 - 5.1 mmol/L   Chloride 107 101 - 111 mmol/L   BUN 4 (L) 6 - 20 mg/dL   Creatinine, Ser 0.98 0.44 - 1.00 mg/dL   Glucose, Bld 119 (H) 65 - 99 mg/dL   Calcium, Ion 1.47 (L) 1.12 - 1.23 mmol/L   TCO2 19 0 - 100 mmol/L   Hemoglobin 12.2 12.0 - 15.0 g/dL   HCT 82.9 56.2 - 13.0 %  Type and screen     Status: None   Collection Time: 02/28/15  1:20 PM  Result Value Ref Range   ABO/RH(D) B POS    Antibody Screen NEG    Sample Expiration 03/03/2015   POC occult blood, ED     Status: Abnormal   Collection Time: 02/28/15  1:55 PM  Result Value Ref Range   Fecal Occult Bld POSITIVE (A) NEGATIVE  Comprehensive metabolic panel     Status: Abnormal   Collection Time: 02/28/15  4:11 PM  Result Value Ref Range   Sodium 146 (H) 135 - 145 mmol/L   Potassium 3.2 (L) 3.5 - 5.1 mmol/L   Chloride 112 (H) 101 - 111 mmol/L   CO2 26 22 - 32 mmol/L   Glucose, Bld 113 (H) 65 - 99 mg/dL   BUN 6 6 - 20 mg/dL   Creatinine, Ser 8.65 0.44 - 1.00 mg/dL   Calcium 6.5 (L) 8.9 - 10.3 mg/dL   Total Protein 4.7 (L) 6.5 - 8.1 g/dL   Albumin 1.8 (L) 3.5 - 5.0 g/dL   AST 784 (H) 15 - 41 U/L   ALT 55 (H) 14 - 54 U/L   Alkaline Phosphatase 207 (H) 38 - 126 U/L   Total Bilirubin 0.8 0.3 - 1.2 mg/dL   GFR calc non Af Amer >60 >60 mL/min   GFR calc Af Amer >60 >60 mL/min   Anion gap 8 5 - 15  Procalcitonin - Baseline     Status: None   Collection Time: 02/28/15  4:11 PM  Result Value Ref Range   Procalcitonin <0.10 ng/mL  CK     Status: None   Collection Time: 02/28/15  4:11 PM  Result Value Ref  Range   Total CK 112 38 - 234 U/L  Vitamin B12     Status: None   Collection Time: 02/28/15  4:11 PM  Result Value Ref Range   Vitamin B-12 655 180 - 914 pg/mL  Folate     Status: None   Collection Time: 02/28/15  4:11 PM  Result Value Ref Range   Folate 19.8 >5.9 ng/mL  Iron and TIBC     Status: Abnormal   Collection Time: 02/28/15  4:11 PM  Result Value Ref Range   Iron 182  (H) 28 - 170 ug/dL   TIBC 578 (L) 469 - 629 ug/dL   Saturation Ratios 84 (H) 10.4 - 31.8 %   UIBC 34 ug/dL  Ferritin     Status: None   Collection Time: 02/28/15  4:11 PM  Result Value Ref Range   Ferritin 43 11 - 307 ng/mL  Reticulocytes     Status: Abnormal   Collection Time: 02/28/15  4:11 PM  Result Value Ref Range   Retic Ct Pct 2.3 0.4 - 3.1 %   RBC. 3.10 (L) 3.87 - 5.11 MIL/uL   Retic Count, Manual 71.3 19.0 - 186.0 K/uL  TSH     Status: None   Collection Time: 02/28/15  4:11 PM  Result Value Ref Range   TSH 0.659 0.350 - 4.500 uIU/mL  I-Stat CG4 Lactic Acid, ED     Status: Abnormal   Collection Time: 02/28/15  4:36 PM  Result Value Ref Range   Lactic Acid, Venous 3.69 (HH) 0.5 - 2.0 mmol/L  MRSA PCR Screening     Status: None   Collection Time: 02/28/15  8:20 PM  Result Value Ref Range   MRSA by PCR NEGATIVE NEGATIVE  CBC     Status: Abnormal   Collection Time: 02/28/15  9:07 PM  Result Value Ref Range   WBC 11.5 (H) 4.0 - 10.5 K/uL   RBC 2.99 (L) 3.87 - 5.11 MIL/uL   Hemoglobin 8.4 (L) 12.0 - 15.0 g/dL   HCT 52.8 (L) 41.3 - 24.4 %   MCV 89.6 78.0 - 100.0 fL   MCH 28.1 26.0 - 34.0 pg   MCHC 31.3 30.0 - 36.0 g/dL   RDW 01.0 (H) 27.2 - 53.6 %   Platelets 222 150 - 400 K/uL  CBC     Status: Abnormal   Collection Time: 03/01/15  6:15 AM  Result Value Ref Range   WBC 10.6 (H) 4.0 - 10.5 K/uL   RBC 2.72 (L) 3.87 - 5.11 MIL/uL   Hemoglobin 7.7 (L) 12.0 - 15.0 g/dL   HCT 64.4 (L) 03.4 - 74.2 %   MCV 89.0 78.0 - 100.0 fL   MCH 28.3 26.0 - 34.0 pg   MCHC 31.8 30.0 - 36.0 g/dL   RDW 59.5 (H) 63.8 - 75.6 %   Platelets 202 150 - 400 K/uL  Comprehensive metabolic panel     Status: Abnormal   Collection Time: 03/01/15  6:15 AM  Result Value Ref Range   Sodium 146 (H) 135 - 145 mmol/L   Potassium 2.8 (L) 3.5 - 5.1 mmol/L   Chloride 120 (H) 101 - 111 mmol/L   CO2 22 22 - 32 mmol/L   Glucose, Bld 66 65 - 99 mg/dL   BUN 8 6 - 20 mg/dL   Creatinine, Ser 4.33 0.44 - 1.00  mg/dL   Calcium 6.0 (LL) 8.9 - 10.3 mg/dL   Total Protein 3.9 (L) 6.5 - 8.1 g/dL  Albumin 1.5 (L) 3.5 - 5.0 g/dL   AST 69 (H) 15 - 41 U/L   ALT 37 14 - 54 U/L   Alkaline Phosphatase 157 (H) 38 - 126 U/L   Total Bilirubin 0.6 0.3 - 1.2 mg/dL   GFR calc non Af Amer >60 >60 mL/min   GFR calc Af Amer >60 >60 mL/min   Anion gap 4 (L) 5 - 15  Protime-INR     Status: Abnormal   Collection Time: 03/01/15  6:15 AM  Result Value Ref Range   Prothrombin Time 16.7 (H) 11.6 - 15.2 seconds   INR 1.34 0.00 - 1.49  APTT     Status: None   Collection Time: 03/01/15  6:15 AM  Result Value Ref Range   aPTT 30 24 - 37 seconds    Studies/Results: Ct Abdomen Pelvis Wo Contrast  02/28/2015   CLINICAL DATA:  c/o fall sat night 02/26/2015 , pt is c/o some slight neck pain but mostly c/o right rib pain Limited ROM Pt very sore to bilateral arms Hx of HTN Hx of neuropathy Hx of chrohn's  EXAM: CT CHEST, ABDOMEN AND PELVIS WITHOUT CONTRAST  This exam is limited for trauma given lack of intravenous contrast.  TECHNIQUE: Multidetector CT imaging of the chest, abdomen and pelvis was performed following the standard protocol without IV contrast.  COMPARISON:  12/28/2014  FINDINGS: CT CHEST FINDINGS  Thoracic inlet:  No masses.  No adenopathy.  Mediastinum and hila: Heart normal in size and configuration. Dense coronary artery calcifications. Great vessels normal in caliber. No mediastinal or hilar masses or evidence of adenopathy.  Lungs and pleura: 2.6 mm nodular opacity in the right upper lobe, image 9, series 3. Lungs otherwise clear. No pleural effusion. No pneumothorax.  CT ABDOMEN AND PELVIS FINDINGS  Liver: There is lesion at the dome of the liver with a increased attenuation margin, currently measuring 2.3 cm by 1.8 cm, unchanged from prior exam. No other liver abnormality.  Spleen and pancreas:  Unremarkable.  Gallbladder and biliary tree: Gallbladder unremarkable. Common bile duct is dilated to 1 cm in the  pancreatic head with abrupt tapering. This is unchanged from the prior study.  Adrenal glands:  No masses.  Kidneys, ureters, bladder: Small bilateral nonobstructing intrarenal stones. No renal masses. No hydronephrosis. Ureters normal course and caliber. Bladder is mostly decompressed but otherwise unremarkable.  Uterus and adnexa:  Unremarkable.  Lymph nodes:  No adenopathy.  Ascites:  None.  GI tract: Unremarkable. No evidence of a bowel hematoma or mesenteric hematoma.  Vascular: Atherosclerotic calcifications along the aorta and its branch vessels. No aneurysm.  MUSCULOSKELETAL  No fracture.  No osteoblastic or osteolytic lesions.  IMPRESSION: 1. Study limited due to lack of contrast which particularly limits evaluation for subtle injuries to solid viscera. 2. Allowing for the above limitation, no evidence of acute injury to the chest, abdomen or pelvis. 3. 2.6 mm nodule in the right upper lobe. This is likely scarring. If the patient is at high risk for bronchogenic carcinoma, follow-up chest CT at 1 year is recommended. If the patient is at low risk, no follow-up is needed. This recommendation follows the consensus statement: Guidelines for Management of Small Pulmonary Nodules Detected on CT Scans: A Statement from the Fleischner Society as published in Radiology 2005; 237:395-400. 4. There are chronic findings in the chest abdomen pelvis as detailed above.   Electronically Signed   By: Amie Portland M.D.   On: 02/28/2015 13:12   Ct  Head Wo Contrast  02/28/2015   CLINICAL DATA:  Status post fall.  EXAM: CT HEAD WITHOUT CONTRAST  TECHNIQUE: Contiguous axial images were obtained from the base of the skull through the vertex without intravenous contrast.  COMPARISON:  12/28/2014  FINDINGS: There is prominence of the sulci and ventricles consistent with brain atrophy. Mild diffuse low attenuation throughout the subcortical and periventricular white matter is noted consistent with brain atrophy. There is no  evidence for acute intracranial hemorrhage, cortical infarct or mass. No abnormal extra-axial fluid collections identified. The paranasal sinuses are clear. The mastoid air cells are also clear. Normal appearance of the calvarium.  IMPRESSION: 1. No acute intracranial abnormalities. 2. Chronic microvascular disease and brain atrophy.   Electronically Signed   By: Signa Kell M.D.   On: 02/28/2015 13:02   Ct Chest Wo Contrast  02/28/2015   CLINICAL DATA:  c/o fall sat night 02/26/2015 , pt is c/o some slight neck pain but mostly c/o right rib pain Limited ROM Pt very sore to bilateral arms Hx of HTN Hx of neuropathy Hx of chrohn's  EXAM: CT CHEST, ABDOMEN AND PELVIS WITHOUT CONTRAST  This exam is limited for trauma given lack of intravenous contrast.  TECHNIQUE: Multidetector CT imaging of the chest, abdomen and pelvis was performed following the standard protocol without IV contrast.  COMPARISON:  12/28/2014  FINDINGS: CT CHEST FINDINGS  Thoracic inlet:  No masses.  No adenopathy.  Mediastinum and hila: Heart normal in size and configuration. Dense coronary artery calcifications. Great vessels normal in caliber. No mediastinal or hilar masses or evidence of adenopathy.  Lungs and pleura: 2.6 mm nodular opacity in the right upper lobe, image 9, series 3. Lungs otherwise clear. No pleural effusion. No pneumothorax.  CT ABDOMEN AND PELVIS FINDINGS  Liver: There is lesion at the dome of the liver with a increased attenuation margin, currently measuring 2.3 cm by 1.8 cm, unchanged from prior exam. No other liver abnormality.  Spleen and pancreas:  Unremarkable.  Gallbladder and biliary tree: Gallbladder unremarkable. Common bile duct is dilated to 1 cm in the pancreatic head with abrupt tapering. This is unchanged from the prior study.  Adrenal glands:  No masses.  Kidneys, ureters, bladder: Small bilateral nonobstructing intrarenal stones. No renal masses. No hydronephrosis. Ureters normal course and caliber.  Bladder is mostly decompressed but otherwise unremarkable.  Uterus and adnexa:  Unremarkable.  Lymph nodes:  No adenopathy.  Ascites:  None.  GI tract: Unremarkable. No evidence of a bowel hematoma or mesenteric hematoma.  Vascular: Atherosclerotic calcifications along the aorta and its branch vessels. No aneurysm.  MUSCULOSKELETAL  No fracture.  No osteoblastic or osteolytic lesions.  IMPRESSION: 1. Study limited due to lack of contrast which particularly limits evaluation for subtle injuries to solid viscera. 2. Allowing for the above limitation, no evidence of acute injury to the chest, abdomen or pelvis. 3. 2.6 mm nodule in the right upper lobe. This is likely scarring. If the patient is at high risk for bronchogenic carcinoma, follow-up chest CT at 1 year is recommended. If the patient is at low risk, no follow-up is needed. This recommendation follows the consensus statement: Guidelines for Management of Small Pulmonary Nodules Detected on CT Scans: A Statement from the Fleischner Society as published in Radiology 2005; 237:395-400. 4. There are chronic findings in the chest abdomen pelvis as detailed above.   Electronically Signed   By: Amie Portland M.D.   On: 02/28/2015 13:12  Assessment: Anemia, dark heme positive stools inpatient with history of possible Crohn's disease, due for colonoscopy, questionable history of Crohn's disease. She is also on blood thinner eloquis which is on hold  Plan: Will set up for combined EGD and colonoscopy tomorrow.    Jazmynn Pho C 03/01/2015, 8:40 AM  Pager [phone] If no answer or after 5 PM call [phone]

## 2015-03-01 NOTE — Progress Notes (Signed)
[facility] PROGRESS NOTE   [name]:[id number] DOB: [birth year] DOA: 02/28/2015 PCP: Tommy Rainwater, MD  HPI/Subjective: Denies any hematemesis or melanotic overnight. GI notes reviewed, patient for EGD/colonoscopy in a.m.  Assessment/Plan: Active Problems:   DM2 (diabetes mellitus, type 2)   HTN (hypertension)   Asthma   Crohn's disease   Metabolic acidosis   Hypokalemia   Normocytic anemia   Physical deconditioning   Heme positive stool   History of pulmonary embolus (PE)   Dehydration   Chest pain, musculoskeletal   Hepatic steatosis   Chronic diarrhea of unknown origin   Severe protein-calorie malnutrition   Fall at home   Chronic pain    Crohn's disease/Chronic diarrhea of unknown origin/ Heme positive stool -Questionable history of Crohn's disease per GI, chronic diarrhea of unknown origin with heme positive stools. -Reported one time vomiting "looks like coffee-ground", patient reported. -On clear liquids. Twice a day IV Protonix. -Seen by [facility], for EGD/colonoscopy a.m.  Anemia -Acute on chronic anemia, baseline 9.6. Acute blood loss anemia on top of chronic anemia. -Hemoglobin dropped to 7.9, check hemoglobin twice a day. -If hemoglobin dropped less than 7.5 I will transfuse.   Metabolic acidosis/Dehydration -no abdominal pain on exam so doubt issue such as ischemic bowel -suspect profound DH as etiology -Check lactic acid in the morning, likely lactic acid elevation because of hypovolemia and hypoperfusion.   DM2  -mildly hyperglycemic -not on meds PTA -HgbA1c was 4.9 in 2015 and has never been higher than 5.5 per our records so doubt this is a dx for her   HTN  -current BP soft and was NOT on meds PTA   Hypokalemia -appears to be chronic and likely 2/2 combo chronic diarrhea and malnutrition, severe of 2.8 -Replete with oral supplements.   Chest pain, musculoskeletal/Fall at home -Cont preadmit narcs but no  NSAIDs -DO NOT ESCALATE NARC MEDS!! This was d/w pt at length   Chronic pain -has documented drug seeking behaviors as ell as multiple prescribing MDs per last admit note in April   Asthma -compensated w/o wheezing   Physical deconditioning/ Severe protein-calorie malnutrition -Nutrition eval -was on protein supplement drink PTA   History of pulmonary embolus (PE) -Eliquis on hold until etiology to heme positve stool/melena can be determined   Hepatic steatosis -Mild elevation in LFTs from baseline in setting of profound DH -Rpt today in in am   Hypothyroidism -Cont Synthroid -Ck TSH   Code Status: Full Code Family Communication: Plan discussed with the patient. Disposition Plan: Remains inpatient Diet: Diet clear liquid Room service appropriate?: Yes; Fluid consistency:: Thin Diet NPO time specified  Consultants:  None  Procedures:  None  Antibiotics:  None   Objective: Filed Vitals:   03/01/15 1135  BP: 78/50  Pulse: 48  Temp:   Resp: 18    Intake/Output Summary (Last 24 hours) at 03/01/15 1744 Last data filed at 03/01/15 0800  Gross per 24 hour  Intake 2127.92 ml  Output      0 ml  Net 2127.92 ml   Filed Weights   02/28/15 1208 02/28/15 2020 03/01/15 0304  Weight: 34.473 kg (76 lb) 37.2 kg (82 lb 0.2 oz) 39.7 kg (87 lb 8.4 oz)    Exam: General: Alert and awake, oriented x3, not in any acute distress. HEENT: anicteric sclera, pupils reactive to light and accommodation, EOMI CVS: S1-S2 clear, no murmur rubs or gallops Chest: clear to auscultation bilaterally, no wheezing, rales or rhonchi Abdomen: soft  nontender, nondistended, normal bowel sounds, no organomegaly Extremities: no cyanosis, clubbing or edema noted bilaterally Neuro: Cranial nerves II-XII intact, no focal neurological deficits  Data Reviewed: Basic Metabolic Panel:  Recent Labs Lab 02/28/15 1220 02/28/15 1243 02/28/15 1611 03/01/15 0615  NA 147* 145 146* 146*  K  3.2* 3.0* 3.2* 2.8*  CL 107 107 112* 120*  CO2 23  --  26 22  GLUCOSE 132* 128* 113* 66  BUN 7 4* 6 8  CREATININE 0.99 0.80 0.92 0.78  CALCIUM 7.4*  --  6.5* 6.0*   Liver Function Tests:  Recent Labs Lab 02/28/15 1220 02/28/15 1611 03/01/15 0615  AST 171* 143* 69*  ALT 63* 55* 37  ALKPHOS 222* 207* 157*  BILITOT 1.0 0.8 0.6  PROT 5.4* 4.7* 3.9*  ALBUMIN 2.0* 1.8* 1.5*   No results for input(s): LIPASE, AMYLASE in the last 168 hours. No results for input(s): AMMONIA in the last 168 hours. CBC:  Recent Labs Lab 02/28/15 1220 02/28/15 1243 02/28/15 2107 03/01/15 0615 03/01/15 1221  WBC 10.5  --  11.5* 10.6* 10.4  HGB 9.7* 12.2 8.4* 7.7* 7.9*  HCT 31.1* 36.0 26.8* 24.2* 24.7*  MCV 89.4  --  89.6 89.0 89.2  PLT 250  --  222 202 210   Cardiac Enzymes:  Recent Labs Lab 02/28/15 1611  CKTOTAL 112   BNP (last 3 results)  Recent Labs  12/28/14 1905  BNP 77.5    ProBNP (last 3 results)  Recent Labs  07/26/14 1138  PROBNP 440.7*    CBG: No results for input(s): GLUCAP in the last 168 hours.  Micro Recent Results (from the past 240 hour(s))  Gram stain     Status: None   Collection Time: 02/28/15 12:27 PM  Result Value Ref Range Status   Specimen Description URINE, RANDOM  Final   Special Requests NONE  Final   Gram Stain   Final    WBC PRESENT, PREDOMINANTLY PMN Multiple bacterial morphotypes present, none predominant. Suggest appropriate recollection if clinically indicated. CYTOSPIN URINE    Report Status 02/28/2015 FINAL  Final  MRSA PCR Screening     Status: None   Collection Time: 02/28/15  8:20 PM  Result Value Ref Range Status   MRSA by PCR NEGATIVE NEGATIVE Final    Comment:        The GeneXpert MRSA Assay (FDA approved for NASAL specimens only), is one component of a comprehensive MRSA colonization surveillance program. It is not intended to diagnose MRSA infection nor to guide or monitor treatment for MRSA infections.       Studies: Ct Abdomen Pelvis Wo Contrast  02/28/2015   CLINICAL DATA:  c/o fall sat night 02/26/2015 , pt is c/o some slight neck pain but mostly c/o right rib pain Limited ROM Pt very sore to bilateral arms Hx of HTN Hx of neuropathy Hx of chrohn's  EXAM: CT CHEST, ABDOMEN AND PELVIS WITHOUT CONTRAST  This exam is limited for trauma given lack of intravenous contrast.  TECHNIQUE: Multidetector CT imaging of the chest, abdomen and pelvis was performed following the standard protocol without IV contrast.  COMPARISON:  12/28/2014  FINDINGS: CT CHEST FINDINGS  Thoracic inlet:  No masses.  No adenopathy.  Mediastinum and hila: Heart normal in size and configuration. Dense coronary artery calcifications. Great vessels normal in caliber. No mediastinal or hilar masses or evidence of adenopathy.  Lungs and pleura: 2.6 mm nodular opacity in the right upper lobe, image 9, series 3. Lungs  otherwise clear. No pleural effusion. No pneumothorax.  CT ABDOMEN AND PELVIS FINDINGS  Liver: There is lesion at the dome of the liver with a increased attenuation margin, currently measuring 2.3 cm by 1.8 cm, unchanged from prior exam. No other liver abnormality.  Spleen and pancreas:  Unremarkable.  Gallbladder and biliary tree: Gallbladder unremarkable. Common bile duct is dilated to 1 cm in the pancreatic head with abrupt tapering. This is unchanged from the prior study.  Adrenal glands:  No masses.  Kidneys, ureters, bladder: Small bilateral nonobstructing intrarenal stones. No renal masses. No hydronephrosis. Ureters normal course and caliber. Bladder is mostly decompressed but otherwise unremarkable.  Uterus and adnexa:  Unremarkable.  Lymph nodes:  No adenopathy.  Ascites:  None.  GI tract: Unremarkable. No evidence of a bowel hematoma or mesenteric hematoma.  Vascular: Atherosclerotic calcifications along the aorta and its branch vessels. No aneurysm.  MUSCULOSKELETAL  No fracture.  No osteoblastic or osteolytic lesions.   IMPRESSION: 1. Study limited due to lack of contrast which particularly limits evaluation for subtle injuries to solid viscera. 2. Allowing for the above limitation, no evidence of acute injury to the chest, abdomen or pelvis. 3. 2.6 mm nodule in the right upper lobe. This is likely scarring. If the patient is at high risk for bronchogenic carcinoma, follow-up chest CT at 1 year is recommended. If the patient is at low risk, no follow-up is needed. This recommendation follows the consensus statement: Guidelines for Management of Small Pulmonary Nodules Detected on CT Scans: A Statement from the Fleischner Society as published in Radiology 2005; 237:395-400. 4. There are chronic findings in the chest abdomen pelvis as detailed above.   Electronically Signed   By: Amie Portland M.D.   On: 02/28/2015 13:12   Ct Head Wo Contrast  02/28/2015   CLINICAL DATA:  Status post fall.  EXAM: CT HEAD WITHOUT CONTRAST  TECHNIQUE: Contiguous axial images were obtained from the base of the skull through the vertex without intravenous contrast.  COMPARISON:  12/28/2014  FINDINGS: There is prominence of the sulci and ventricles consistent with brain atrophy. Mild diffuse low attenuation throughout the subcortical and periventricular white matter is noted consistent with brain atrophy. There is no evidence for acute intracranial hemorrhage, cortical infarct or mass. No abnormal extra-axial fluid collections identified. The paranasal sinuses are clear. The mastoid air cells are also clear. Normal appearance of the calvarium.  IMPRESSION: 1. No acute intracranial abnormalities. 2. Chronic microvascular disease and brain atrophy.   Electronically Signed   By: Signa Kell M.D.   On: 02/28/2015 13:02   Ct Chest Wo Contrast  02/28/2015   CLINICAL DATA:  c/o fall sat night 02/26/2015 , pt is c/o some slight neck pain but mostly c/o right rib pain Limited ROM Pt very sore to bilateral arms Hx of HTN Hx of neuropathy Hx of chrohn's   EXAM: CT CHEST, ABDOMEN AND PELVIS WITHOUT CONTRAST  This exam is limited for trauma given lack of intravenous contrast.  TECHNIQUE: Multidetector CT imaging of the chest, abdomen and pelvis was performed following the standard protocol without IV contrast.  COMPARISON:  12/28/2014  FINDINGS: CT CHEST FINDINGS  Thoracic inlet:  No masses.  No adenopathy.  Mediastinum and hila: Heart normal in size and configuration. Dense coronary artery calcifications. Great vessels normal in caliber. No mediastinal or hilar masses or evidence of adenopathy.  Lungs and pleura: 2.6 mm nodular opacity in the right upper lobe, image 9, series 3. Lungs otherwise clear.  No pleural effusion. No pneumothorax.  CT ABDOMEN AND PELVIS FINDINGS  Liver: There is lesion at the dome of the liver with a increased attenuation margin, currently measuring 2.3 cm by 1.8 cm, unchanged from prior exam. No other liver abnormality.  Spleen and pancreas:  Unremarkable.  Gallbladder and biliary tree: Gallbladder unremarkable. Common bile duct is dilated to 1 cm in the pancreatic head with abrupt tapering. This is unchanged from the prior study.  Adrenal glands:  No masses.  Kidneys, ureters, bladder: Small bilateral nonobstructing intrarenal stones. No renal masses. No hydronephrosis. Ureters normal course and caliber. Bladder is mostly decompressed but otherwise unremarkable.  Uterus and adnexa:  Unremarkable.  Lymph nodes:  No adenopathy.  Ascites:  None.  GI tract: Unremarkable. No evidence of a bowel hematoma or mesenteric hematoma.  Vascular: Atherosclerotic calcifications along the aorta and its branch vessels. No aneurysm.  MUSCULOSKELETAL  No fracture.  No osteoblastic or osteolytic lesions.  IMPRESSION: 1. Study limited due to lack of contrast which particularly limits evaluation for subtle injuries to solid viscera. 2. Allowing for the above limitation, no evidence of acute injury to the chest, abdomen or pelvis. 3. 2.6 mm nodule in the right  upper lobe. This is likely scarring. If the patient is at high risk for bronchogenic carcinoma, follow-up chest CT at 1 year is recommended. If the patient is at low risk, no follow-up is needed. This recommendation follows the consensus statement: Guidelines for Management of Small Pulmonary Nodules Detected on CT Scans: A Statement from the Fleischner Society as published in Radiology 2005; 237:395-400. 4. There are chronic findings in the chest abdomen pelvis as detailed above.   Electronically Signed   By: Amie Portland M.D.   On: 02/28/2015 13:12    Scheduled Meds: . folic acid  1 mg Oral Daily  . gabapentin  800 mg Oral TID  . levothyroxine  25 mcg Oral QAC breakfast  . magnesium sulfate 1 - 4 g bolus IVPB  2 g Intravenous Q6H  . polyethylene glycol  17 g Oral BID  . polyethylene glycol-electrolytes  4,000 mL Oral Once  . potassium chloride  60 mEq Oral Q6H  . sodium chloride  3 mL Intravenous Q12H   Continuous Infusions: . sodium chloride 150 mL/hr at 03/01/15 0936  . sodium chloride    . pantoprozole (PROTONIX) infusion 8 mg/hr (03/01/15 0800)       Time spent: 35 minutes    [facility] A  [facility] Pager [phone] If 7PM-7AM, please contact night-coverage at [email or web address], password [facility] 03/01/2015, 5:44 PM  LOS: 1 day

## 2015-03-02 ENCOUNTER — Encounter (HOSPITAL_COMMUNITY)
Admission: EM | Disposition: A | Discharge status: Home / Self Care | Payer: Self-pay | Source: Home / Self Care | Attending: Internal Medicine

## 2015-03-02 ENCOUNTER — Inpatient Hospital Stay (HOSPITAL_COMMUNITY): Payer: Medicare Other | Admitting: Anesthesiology

## 2015-03-02 ENCOUNTER — Encounter (HOSPITAL_COMMUNITY): Payer: Self-pay | Admitting: *Deleted

## 2015-03-02 DIAGNOSIS — E875 Hyperkalemia: Secondary | ICD-10-CM

## 2015-03-02 DIAGNOSIS — E870 Hyperosmolality and hypernatremia: Secondary | ICD-10-CM

## 2015-03-02 DIAGNOSIS — D62 Acute posthemorrhagic anemia: Secondary | ICD-10-CM

## 2015-03-02 DIAGNOSIS — K922 Gastrointestinal hemorrhage, unspecified: Principal | ICD-10-CM

## 2015-03-02 HISTORY — PX: ESOPHAGOGASTRODUODENOSCOPY: SHX5428

## 2015-03-02 HISTORY — PX: COLONOSCOPY: SHX5424

## 2015-03-02 LAB — CBC
HCT: 25.4 % — ABNORMAL LOW (ref 36.0–46.0)
HEMATOCRIT: 25.3 % — AB (ref 36.0–46.0)
Hemoglobin: 7.9 g/dL — ABNORMAL LOW (ref 12.0–15.0)
Hemoglobin: 8 g/dL — ABNORMAL LOW (ref 12.0–15.0)
MCH: 28.2 pg (ref 26.0–34.0)
MCH: 28.7 pg (ref 26.0–34.0)
MCHC: 31.1 g/dL (ref 30.0–36.0)
MCHC: 31.6 g/dL (ref 30.0–36.0)
MCV: 90.7 fL (ref 78.0–100.0)
MCV: 90.7 fL (ref 78.0–100.0)
PLATELETS: 183 10*3/uL (ref 150–400)
PLATELETS: 181 10*3/uL (ref 150–400)
RBC: 2.8 MIL/uL — ABNORMAL LOW (ref 3.87–5.11)
RBC: 2.79 MIL/uL — ABNORMAL LOW (ref 3.87–5.11)
RDW: 19 % — ABNORMAL HIGH (ref 11.5–15.5)
RDW: 18.9 % — AB (ref 11.5–15.5)
WBC: 8.1 10*3/uL (ref 4.0–10.5)
WBC: 7.8 10*3/uL (ref 4.0–10.5)

## 2015-03-02 LAB — BASIC METABOLIC PANEL
Anion gap: 7 (ref 5–15)
BUN: <5 mg/dL — ABNORMAL LOW (ref 6–20)
CHLORIDE: 121 mmol/L — AB (ref 101–111)
CO2: 14 mmol/L — AB (ref 22–32)
Calcium: 6.8 mg/dL — ABNORMAL LOW (ref 8.9–10.3)
Creatinine, Ser: 0.63 mg/dL (ref 0.44–1.00)
GFR calc Af Amer: >60 mL/min (ref >60)
GLUCOSE: 71 mg/dL (ref 65–99)
POTASSIUM: 6.3 mmol/L — AB (ref 3.5–5.1)
Sodium: 142 mmol/L (ref 135–145)

## 2015-03-02 LAB — POTASSIUM: Potassium: 5.3 mmol/L — ABNORMAL HIGH (ref 3.5–5.1)

## 2015-03-02 LAB — PROCALCITONIN: Procalcitonin: <0.1 ng/mL

## 2015-03-02 LAB — MAGNESIUM: Magnesium: 2.3 mg/dL (ref 1.7–2.4)

## 2015-03-02 SURGERY — EGD (ESOPHAGOGASTRODUODENOSCOPY)
Anesthesia: Moderate Sedation

## 2015-03-02 SURGERY — EGD (ESOPHAGOGASTRODUODENOSCOPY)
Anesthesia: Monitor Anesthesia Care

## 2015-03-02 SURGERY — COLONOSCOPY
Anesthesia: Monitor Anesthesia Care

## 2015-03-02 MED ORDER — ROSUVASTATIN CALCIUM 20 MG PO TABS
20.0000 mg | ORAL_TABLET | Freq: Every day | ORAL | Status: DC
  Administered 2015-03-02: 20 mg via ORAL
  Filled 2015-03-02 (×2): qty 1

## 2015-03-02 MED ORDER — SODIUM CHLORIDE 0.9 % IV SOLN
INTRAVENOUS | Status: DC
  Administered 2015-03-02: 13:00:00 via INTRAVENOUS

## 2015-03-02 MED ORDER — PANTOPRAZOLE SODIUM 40 MG PO TBEC
40.0000 mg | DELAYED_RELEASE_TABLET | Freq: Every day | ORAL | Status: DC
  Administered 2015-03-02 – 2015-03-04 (×3): 40 mg via ORAL
  Filled 2015-03-02 (×3): qty 1

## 2015-03-02 MED ORDER — TRAZODONE 25 MG HALF TABLET
25.0000 mg | ORAL_TABLET | ORAL | Status: DC | PRN
  Administered 2015-03-03: 25 mg via ORAL
  Filled 2015-03-02 (×2): qty 1

## 2015-03-02 MED ORDER — APIXABAN 5 MG PO TABS
5.0000 mg | ORAL_TABLET | Freq: Two times a day (BID) | ORAL | Status: DC
  Administered 2015-03-03 – 2015-03-04 (×3): 5 mg via ORAL
  Filled 2015-03-02 (×4): qty 1

## 2015-03-02 MED ORDER — SODIUM POLYSTYRENE SULFONATE 15 GM/60ML PO SUSP
15.0000 g | Freq: Once | ORAL | Status: DC
  Filled 2015-03-02: qty 60

## 2015-03-02 MED ORDER — PROPOFOL 10 MG/ML IV BOLUS
INTRAVENOUS | Status: DC | PRN
  Administered 2015-03-02: 20 mg via INTRAVENOUS
  Administered 2015-03-02: 30 mg via INTRAVENOUS
  Administered 2015-03-02 (×2): 20 mg via INTRAVENOUS
  Administered 2015-03-02: 30 mg via INTRAVENOUS
  Administered 2015-03-02: 20 mg via INTRAVENOUS

## 2015-03-02 MED ORDER — LIDOCAINE HCL (CARDIAC) 20 MG/ML IV SOLN
INTRAVENOUS | Status: DC | PRN
  Administered 2015-03-02: 50 mg via INTRAVENOUS

## 2015-03-02 MED ORDER — BUTAMBEN-TETRACAINE-BENZOCAINE 2-2-14 % EX AERO
INHALATION_SPRAY | CUTANEOUS | Status: DC | PRN
Start: 2015-03-02 — End: 2015-03-02
  Administered 2015-03-02: 2 via TOPICAL

## 2015-03-02 MED ORDER — FENTANYL CITRATE (PF) 100 MCG/2ML IJ SOLN
INTRAMUSCULAR | Status: DC | PRN
  Administered 2015-03-02: 150 ug via INTRAVENOUS
  Administered 2015-03-02: 100 ug via INTRAVENOUS

## 2015-03-02 MED ORDER — MIDAZOLAM HCL 5 MG/5ML IJ SOLN
INTRAMUSCULAR | Status: DC | PRN
  Administered 2015-03-02: 2 mg via INTRAVENOUS

## 2015-03-02 MED ORDER — PHENYLEPHRINE HCL 10 MG/ML IJ SOLN
INTRAMUSCULAR | Status: DC | PRN
  Administered 2015-03-02: 80 ug via INTRAVENOUS

## 2015-03-02 NOTE — Op Note (Signed)
[facility] [address] [place], [address]   COLONOSCOPY PROCEDURE REPORT  PATIENT: [name], [name]  MR#: [id number] BIRTHDATE: [birth year] , 54  yrs. old GENDER: female ENDOSCOPIST: Bernette Redbird, MD REFERRED BY:  Dr. Lonzo Cloud PROCEDURE DATE:  03/04/2015 PROCEDURE:   colonoscopy with biopsies ASA CLASS:   III INDICATIONS:  anemia, 2 g drop in hemoglobin, heme-positive stool, negative upper endoscopy, remote history of colonic ulcerations with questionable Crohn's colitis MEDICATIONS: Mac with propofol  DESCRIPTION OF PROCEDURE:   After the risks and benefits and of the procedure were explained, informed consent was obtained. the patient was brought from her hospital room to the [facility] cone endoscopy unit and time out was performed.  digital exam was unremarkable.  The Pentax pediatric video       endoscope was introduced through the anus and advanced to the terminal ileum  without significant difficulty, using some external abdominal compression to control looping    .  The quality of the prep was  very good      .  The instrument was then slowly withdrawn as the colon was fully examined. Estimated blood loss is zero unless otherwise noted in this procedure report.   the terminal ileum had a normal appearance.  During pullback, the colonic mucosa was carefully examined, and I did not see any inflammatory changes to suggest smoldering Crohn's colitis or inflammation of any other type.  There were some petechial hemorrhages in the proximal descending colon as is commonly seen, but no ulcerations or pseudopolyps or anatomic deformities.  No diverticulosis, vascular ectasia, polyps, or masses were observed.  retroflexion in the rectum showed some small nodules and deformity, suggesting previous possible hemorrhoidal surgery. Reinspection of the rectum showed no additional findings.  I did obtain random mucosal biopsies along the length of  the colon to check for histologic evidence of colitis.  The scope was then withdrawn from the patient and the procedure completed.  WITHDRAWAL TIME:  COMPLICATIONS: There were no immediate complications.  ENDOSCOPIC IMPRESSION: 1. Essentially normal examination 2. No source of heme positive stool or anemia endoscopically evident 3. No evidence of Crohn's colitis, as had been suspected on colonoscopy approximately 12 years ago by Dr. Reece Agar based on the presence of multiple colonic ulcerations 4. Slight distal rectal/anal deformity,? Previous hemorrhoid surgery.  RECOMMENDATIONS: 1. Await pathology on today's biopsies 2. Consider possible cessation of sulfasalazine, which I believe the patient has been on for more than 10 years 3. Consider small bowel capsule endoscopy to complete the patient's anemia workup  REPEAT EXAM: to be arranged at the discretion of the patient's primary gastroenterologist, Dr. Charlott Rakes  cc:  _______________________________ eSigned:  Bernette Redbird, MD 03-04-2015 2:24 PM   CPT CODES: ICD CODES:  The ICD and CPT codes recommended by this software are interpretations from the data that the clinical staff has captured with the software.  The verification of the translation of this report to the ICD and CPT codes and modifiers is the sole responsibility of the health care institution and practicing physician where this report was generated.  [employer] will not be held responsible for the validity of the ICD and CPT codes included on this report.  AMA assumes no liability for data contained or not contained herein. CPT is a Publishing rights manager of the [employer].   PATIENT NAME:  Mahlani, Berninger MR#: [id number]

## 2015-03-02 NOTE — Op Note (Signed)
[facility] [address] [place], [address]   ENDOSCOPY PROCEDURE REPORT  PATIENT: [name], [name]  MR#: [id number] BIRTHDATE: [birth year] , 54  yrs. old GENDER: female ENDOSCOPIST:Fradel Baldonado, MD REFERRED BY: Dr. Lonzo Cloud PROCEDURE DATE:  03-07-15 PROCEDURE:   EGD/bx ASA CLASS:    III INDICATIONS: Heme positive, anemia, coffee ground emesis, ASA/naproxen exposure, 2 gm drop in hgb MEDICATION: MAC -- propofol TOPICAL ANESTHETIC:   Cetacaine  DESCRIPTION OF PROCEDURE:   After the risks and benefits of the procedure were explained, informed consent was obtained. the patient was brought from her hospital room and time out was performed. The Pentax adult video       endoscope was introduced through the mouth  and advanced to the second portion of the duodenum .  The instrument was slowly withdrawn as the mucosa was fully examined. Estimated blood loss is zero unless otherwise noted in this procedure report.    the larynx looked normal.  the esophagus was readily entered under direct vision.       the esophagus was normal in its entirety, without evidence of Mallory-Weiss tear, reflux esophagitis, Barrett's esophagus, varices, infection, neoplasia, ring, stricture, or hiatal hernia.  The stomach contained a small clear residual, no blood or coffee-ground material. There was some mild erythema in the antrum, but no erosions, ulcers, polyps, or masses, including a retroflexed view of the cardia which was normal in appearance and specifically without evidence of gastric varices. There was no evidence of portal gastropathy.  The pylorus, duodenal bulb, and second duodenum were unremarkable apart from some patchy mild duodenal erythema and perhaps some slight mucosal atrophy of the duodenal mucosa, without hypervascularity or scalloping of the duodenal rings. I elected to obtain duodenal biopsies just to be sure that celiac disease is  not present, in view of this patient's thin body habitus and chronic anemia.  The scope was then withdrawn from the patient and the procedure completed.  COMPLICATIONS: There were no immediate complications.  ENDOSCOPIC IMPRESSION: 1. Essentially normal exam. 2. No source of heme positive stool or anemia or drop in hemoglobin or reported coffee-ground emesis evident on current exam 3. No evidence of any significant aspirin/NSAID gastropathy   RECOMMENDATIONS: 1. Proceed to colonoscopic evaluation 2. Would favor continued PPI therapy as prophylaxis against aspirin/NSAID gastropathy (the patient admits to not taking her Protonix on a daily basis) 3. Await pathology results   _______________________________ eSigned:  Bernette Redbird, MD 2015-03-07 2:16 PM     cc:  CPT CODES: ICD CODES:  The ICD and CPT codes recommended by this software are interpretations from the data that the clinical staff has captured with the software.  The verification of the translation of this report to the ICD and CPT codes and modifiers is the sole responsibility of the health care institution and practicing physician where this report was generated.  [employer] will not be held responsible for the validity of the ICD and CPT codes included on this report.  AMA assumes no liability for data contained or not contained herein. CPT is a Publishing rights manager of the [employer].  PATIENT NAME:  [name], [name] MR#: [id number]

## 2015-03-02 NOTE — Anesthesia Preprocedure Evaluation (Addendum)
Anesthesia Evaluation  Patient identified by MRN, date of birth, ID band Patient awake    Reviewed: Allergy & Precautions, NPO status , Patient's Chart, lab work & pertinent test results  Airway Mallampati: III  TM Distance: >3 FB Neck ROM: Full    Dental   Pulmonary asthma , Current Smoker,  breath sounds clear to auscultation        Cardiovascular hypertension, Pt. on medications + dysrhythmias Rhythm:Regular Rate:Normal     Neuro/Psych Anxiety Depression negative neurological ROS     GI/Hepatic Neg liver ROS, Severe protein calorie malnutrition. Crohns GI bleed.   Endo/Other  diabetes  Renal/GU negative Renal ROS     Musculoskeletal   Abdominal   Peds  Hematology  (+) anemia ,   Anesthesia Other Findings   Reproductive/Obstetrics                            Lab Results  Component Value Date   WBC 8.1 03/02/2015   HGB 7.9* 03/02/2015   HCT 25.4* 03/02/2015   MCV 90.7 03/02/2015   PLT 183 03/02/2015   Lab Results  Component Value Date   CREATININE 0.63 03/02/2015   BUN <5* 03/02/2015   NA 142 03/02/2015   K 5.3* 03/02/2015   CL 121* 03/02/2015   CO2 14* 03/02/2015    Anesthesia Physical Anesthesia Plan  ASA: III  Anesthesia Plan: MAC   Post-op Pain Management:    Induction: Intravenous  Airway Management Planned: Simple Face Mask, Natural Airway and Nasal Cannula  Additional Equipment:   Intra-op Plan:   Post-operative Plan:   Informed Consent: I have reviewed the patients History and Physical, chart, labs and discussed the procedure including the risks, benefits and alternatives for the proposed anesthesia with the patient or authorized representative who has indicated his/her understanding and acceptance.   Dental advisory given  Plan Discussed with: CRNA  Anesthesia Plan Comments:        Anesthesia Quick Evaluation

## 2015-03-02 NOTE — Progress Notes (Signed)
Patient ID: [name], female   DOB: December 06, 1960, 54 y.o.   MRN: [id number] [facility] PROGRESS NOTE  [name]:[id number] DOB: 1961-09-18 DOA: 02/28/2015 PCP: Tommy Rainwater, MD  Brief narrative:    41 female with past medical history of PE on anticoagulation with Eliquis, chronic pain with drug seeking behavior, hypothyroidism, history of Crohn's disease who presented to [facility] ED with reports of fall 2 days prior to this admission. She then developed coffee ground emesis along with right upper quadrant pain. On admission, pt was hypotensive, tachycardic. Hemoglobin was 8.4. X ray and CT studies did not show acute findings. She then started having dark stools / diarrhea and had heme positive stools. She did reports taking aspirin for pain control after the fall.   She underwent EGD and colonoscopy 6/1 with no evidence of acute bleed. Per GI if CBC stable she can go home tomorrow am, 6/2.  Assessment/Plan:    Principal Problem: Possible upper GI bleed / Acute blood loss anemia - Hemoglobin was 8.4 on admission and then 7.7 - She did have heme positive stools on admission, no hematemesis. She is on Eliquis for PE but this was held on admission.  - She is status post EGD and colonoscopy with no findings of acute bleed - Per GI will recheck CBC tomorrow am. Hemoglobin is 7.9 today.  - We will resume Eliquis tomorrow am - Diet advanced as tolerated  - Continue protonix daily - If Hgb stable then anticipate D/C by 03/03/15   Active Problems: Crohn's disease / Chronic diarrhea - Per GI it may be worthwhile stopping sulfasalazine  Hypernatremia - Likely from dehydration - Sodium normalized with IV fluids   Hypokalemia / Hyperkalemia - From GI losses then supplemented and hyperaklemic - Repeat potassium 5.3 and given 1 dose kayexalate  Severe protein-calorie malnutrition -Nutrition eval -was on protein supplement drink PTA  History of pulmonary embolus (PE) -  Per GI, may resume Eliquis, will resume tomorrow 6/2  Dyslipidemia - Continue Crestor   Hypothyroidism - Continue  Synthroid   DVT Prophylaxis  - SCD's bilaterally due to risk of bleeding    Code Status: Full.  Family Communication:  plan of care discussed with the patient Disposition Plan: Home likely in am.   IV access:  Peripheral IV  Procedures and diagnostic studies:    Ct Abdomen Pelvis Wo Contrast 02/28/2015   1. Study limited due to lack of contrast which particularly limits evaluation for subtle injuries to solid viscera. 2. Allowing for the above limitation, no evidence of acute injury to the chest, abdomen or pelvis. 3. 2.6 mm nodule in the right upper lobe. This is likely scarring. If the patient is at high risk for bronchogenic carcinoma, follow-up chest CT at 1 year is recommended. If the patient is at low risk, no follow-up is needed. This recommendation follows the consensus statement: Guidelines for Management of Small Pulmonary Nodules Detected on CT Scans: A Statement from the Fleischner Society as published in Radiology 2005; 237:395-400. 4. There are chronic findings in the chest abdomen pelvis as detailed above.   Electronically Signed   By: Amie Portland M.D.   On: 02/28/2015 13:12   Ct Head Wo Contrast 02/28/2015  1. No acute intracranial abnormalities. 2. Chronic microvascular disease and brain atrophy.   Electronically Signed   By: Signa Kell M.D.   On: 02/28/2015 13:02   Ct Chest Wo Contrast 02/28/2015  1. Study limited due to lack of contrast  which particularly limits evaluation for subtle injuries to solid viscera. 2. Allowing for the above limitation, no evidence of acute injury to the chest, abdomen or pelvis. 3. 2.6 mm nodule in the right upper lobe. This is likely scarring. If the patient is at high risk for bronchogenic carcinoma, follow-up chest CT at 1 year is recommended. If the patient is at low risk, no follow-up is needed. This recommendation follows  the consensus statement: Guidelines for Management of Small Pulmonary Nodules Detected on CT Scans: A Statement from the Fleischner Society as published in Radiology 2005; 237:395-400. 4. There are chronic findings in the chest abdomen pelvis as detailed above.     Medical Consultants:  Gastroenterology  Other Consultants:  EGD 6/1 Colonoscopy 6/1  IAnti-Infectives:   None    Manson Passey, MD  [facility] Pager [phone]  Time spent in minutes: 25 minutes  If 7PM-7AM, please contact night-coverage [email or web address] Password [email or web address] 03/02/2015, 3:08 PM   LOS: 2 days    HPI/Subjective: No acute overnight events. Patient reports pain along the right side of the rib cage, 5/10 in intensity.  Objective: Filed Vitals:   03/02/15 0800 03/02/15 1231 03/02/15 1405 03/02/15 1419  BP: 88/64 106/72 127/93 118/81  Pulse: 80 81 112 123  Temp:  98.4 F (36.9 C) 97.8 F (36.6 C)   TempSrc:  Oral Oral   Resp: 18 18 14 15   Height:      Weight:      SpO2: 100% 100% 100% 100%    Intake/Output Summary (Last 24 hours) at 03/02/15 1508 Last data filed at 03/02/15 1403  Gross per 24 hour  Intake    675 ml  Output      0 ml  Net    675 ml    Exam:   General:  Pt is alert, follows commands appropriately, not in acute distress  Cardiovascular: Regular rate and rhythm, S1/S2 (+)  Respiratory: Clear to auscultation bilaterally, no wheezing, no crackles, no rhonchi  Abdomen: Soft, non tender, non distended, bowel sounds present  Extremities: No edema, pulses DP and PT palpable bilaterally  Neuro: Grossly nonfocal  Data Reviewed: Basic Metabolic Panel:  Recent Labs Lab 02/28/15 1220 02/28/15 1243 02/28/15 1611 03/01/15 0615 03/02/15 0649 03/02/15 1128  NA 147* 145 146* 146* 142  --   K 3.2* 3.0* 3.2* 2.8* 6.3* 5.3*  CL 107 107 112* 120* 121*  --   CO2 23  --  26 22 14*  --   GLUCOSE 132* 128* 113* 66 71  --   BUN 7 4* 6 8 <5*  --   CREATININE 0.99 0.80 0.92 0.78  0.63  --   CALCIUM 7.4*  --  6.5* 6.0* 6.8*  --   MG  --   --   --   --  2.3  --    Liver Function Tests:  Recent Labs Lab 02/28/15 1220 02/28/15 1611 03/01/15 0615  AST 171* 143* 69*  ALT 63* 55* 37  ALKPHOS 222* 207* 157*  BILITOT 1.0 0.8 0.6  PROT 5.4* 4.7* 3.9*  ALBUMIN 2.0* 1.8* 1.5*   No results for input(s): LIPASE, AMYLASE in the last 168 hours. No results for input(s): AMMONIA in the last 168 hours. CBC:  Recent Labs Lab 02/28/15 2107 03/01/15 0615 03/01/15 1221 03/01/15 1956 03/02/15 0649  WBC 11.5* 10.6* 10.4 10.6* 8.1  HGB 8.4* 7.7* 7.9* 7.8* 7.9*  HCT 26.8* 24.2* 24.7* 25.3* 25.4*  MCV 89.6 89.0 89.2 91.0 90.7  PLT 222 202 210 204 183   Cardiac Enzymes:  Recent Labs Lab 02/28/15 1611  CKTOTAL 112   BNP: Invalid input(s): POCBNP CBG: No results for input(s): GLUCAP in the last 168 hours.  Recent Results (from the past 240 hour(s))  Gram stain     Status: None   Collection Time: 02/28/15 12:27 PM  Result Value Ref Range Status   Specimen Description URINE, RANDOM  Final   Special Requests NONE  Final   Gram Stain   Final    WBC PRESENT, PREDOMINANTLY PMN Multiple bacterial morphotypes present, none predominant. Suggest appropriate recollection if clinically indicated. CYTOSPIN URINE    Report Status 02/28/2015 FINAL  Final  MRSA PCR Screening     Status: None   Collection Time: 02/28/15  8:20 PM  Result Value Ref Range Status   MRSA by PCR NEGATIVE NEGATIVE Final     Scheduled Meds: . [START ON 03/03/2015] apixaban  5 mg Oral BID  . folic acid  1 mg Oral Daily  . gabapentin  800 mg Oral TID  . levothyroxine  25 mcg Oral QAC breakfast  . pantoprazole  40 mg Oral Daily  . polyethylene glycol  17 g Oral BID  . rosuvastatin  20 mg Oral Daily  . sodium chloride  3 mL Intravenous Q12H  . sodium polystyrene  15 g Oral Once   Continuous Infusions: . sodium chloride 100 mL/hr at 03/01/15 2303  . sodium chloride

## 2015-03-02 NOTE — Transfer of Care (Signed)
Immediate Anesthesia Transfer of Care Note  Patient: [name]  Procedure(s) Performed: Procedure(s): ESOPHAGOGASTRODUODENOSCOPY (EGD) (N/A) COLONOSCOPY (N/A)  Patient Location: Endoscopy Unit  Anesthesia Type:MAC  Level of Consciousness: awake, alert  and oriented  Airway & Oxygen Therapy: Patient Spontanous Breathing and Patient connected to nasal cannula oxygen  Post-op Assessment: Report given to RN and Post -op Vital signs reviewed and stable  Post vital signs: Reviewed and stable  Last Vitals:  Filed Vitals:   03/02/15 1405  BP: 127/93  Pulse: 112  Temp: 36.6 C  Resp: 14    Complications: No apparent anesthesia complications

## 2015-03-02 NOTE — Anesthesia Postprocedure Evaluation (Signed)
  Anesthesia Post-op Note  Patient: [name]  Procedure(s) Performed: Procedure(s): ESOPHAGOGASTRODUODENOSCOPY (EGD) (N/A) COLONOSCOPY (N/A)  Patient Location: PACU  Anesthesia Type:MAC  Level of Consciousness: awake and alert   Airway and Oxygen Therapy: Patient Spontanous Breathing  Post-op Pain: none  Post-op Assessment: Post-op Vital signs reviewed  Post-op Vital Signs: Reviewed  Last Vitals:  Filed Vitals:   03/02/15 1419  BP: 118/81  Pulse: 123  Temp:   Resp: 15    Complications: No apparent anesthesia complications

## 2015-03-02 NOTE — Progress Notes (Signed)
Endoscopy and colonoscopy were well tolerated.  Please see procedure reports.  Patient appears to be at no risk for clinically significant bleeding, unless there is a small bowel lesion that we are not aware of.  Recommendations:  1. I have ordered a diet for the patient, and have switched her Protonix to once a day orally 2. I would recheck a hemoglobin in the morning and, if reasonably stable, would discharge patient tomorrow. 3. Okay to resume Eliquis or other anticoagulation this evening from the GI tract standpoint  4.  Our office will contact the patient to go over today's biopsy results (random mucosal biopsies were obtained as part of her procedures), and to arrange follow-up with her primary gastroenterologist, Dr.Magod, for consideration of possible small bowel capsule endoscopy to complete her GI workup. 5. We will sign off. Please let us know if we can be of further assistance with this patient while she is in the hospital.  Florencia Reasons, M.D. Pager [phone] If no answer or after 5 PM call [phone]

## 2015-03-02 NOTE — Evaluation (Signed)
Physical Therapy Evaluation Patient Details Name: [name] MRN: [id number] DOB: 02/09/1961 Today's Date: 03/02/2015   History of Present Illness  Patient is a 54 y/o female admitted with history of PE on anticoagulation with Eliquis, chronic pain with drug seeking behavior, hypothyroidism, history of Crohn's disease who presented to [facility] ED with reports of fall 2 days prior to this admission. She then developed coffee ground emesis along with right upper quadrant pain  Clinical Impression  Patient presents with decreased mobility due mainly to pain in addition to deficits listed in PT problem list.  She seems severely weak and not sure she was ambulating to bathroom at home as she reports based on level of muscle atrophy.  Feel she needs SNF level rehab, but she refuses at this time requesting HHPT and increased number of hours of CAP aide assistance.  Will follow acutely for maximizing safety and mobility.  She has stairs to enter her apartment which she is not capable to negotiate.  States after last hospitalization several people assisted her into her apartment.  Could use ambulance transport if medically indicated.    Follow Up Recommendations Supervision/Assistance - 24 hour;SNF (but pt declines requesting home with HHPT, increased CAPS assist)    Equipment Recommendations  None recommended by PT    Recommendations for Other Services       Precautions / Restrictions Precautions Precautions: Fall      Mobility  Bed Mobility Overal bed mobility: Needs Assistance Bed Mobility: Rolling;Supine to Sit;Sit to Supine Rolling: Mod assist   Supine to sit: HOB elevated;Min assist Sit to supine: Max assist   General bed mobility comments: heavy bed rail use to come up to sit; to supine she wanted HOB up, but kept it down and assisted to supine with significant pain right side under breast  Transfers Overall transfer level: Needs assistance   Transfers: Lateral/Scoot Transfers          Lateral/Scoot Transfers: Mod assist General transfer comment: refused to stand so assisted to scoot to head of bed in sitting via scoot pivot with pad under patient assisting with forward lean to unweight hips  Ambulation/Gait                Stairs            Wheelchair Mobility    Modified Rankin (Stroke Patients Only)       Balance Overall balance assessment: Needs assistance Sitting-balance support: Single extremity supported;Feet supported Sitting balance-Leahy Scale: Fair Sitting balance - Comments: holding bed rail mostly in sitting, able to sit unsupported, but demonstrates poor postural strength       Standing balance comment: NT                             Pertinent Vitals/Pain Pain Assessment: Faces Faces Pain Scale: Hurts whole lot Pain Location: right side with mobility Pain Descriptors / Indicators: Sharp;Shooting Pain Intervention(s): Monitored during session;Patient requesting pain meds-RN notified;Limited activity within patient's tolerance;Repositioned    Home Living Family/patient expects to be discharged to:: Private residence Living Arrangements: Children Available Help at Discharge: Personal care attendant;Available PRN/intermittently;Family (CAP aide 3 hours a day, states can increase to 6 hours) Type of Home: Apartment Home Access: Stairs to enter   [employer] of Steps: 6 steps with L rail to landing, 6 steps with R rail to apartment Home Layout: One level Home Equipment: Environmental consultant - 2 wheels;Cane - single point;Bedside commode;Shower seat  Prior Function           Comments: using RW and cane for mobility at home     Hand Dominance   Dominant Hand: Left    Extremity/Trunk Assessment               Lower Extremity Assessment: RLE deficits/detail;LLE deficits/detail RLE Deficits / Details: AAROM grossly WFL, strength hip flexion 3/5, knee extension 2+/5, ankle DF 3+/5 LLE Deficits /  Details: AAROM grossly WFL, strength hip flexion 3/5, knee extension 2+/5, ankle DF 3+/5  Cervical / Trunk Assessment: Kyphotic;Other exceptions  Communication      Cognition Arousal/Alertness: Awake/alert Behavior During Therapy: Agitated Overall Cognitive Status: No family/caregiver present to determine baseline cognitive functioning                      General Comments General comments (skin integrity, edema, etc.): Patient with significant muscle atrophy thighs and lower legs.      Exercises        Assessment/Plan    PT Assessment Patient needs continued PT services  PT Diagnosis Acute pain;Generalized weakness;Difficulty walking   PT Problem List Decreased strength;Decreased mobility;Decreased activity tolerance;Decreased balance;Pain  PT Treatment Interventions DME instruction;Therapeutic exercise;Balance training;Gait training;Functional mobility training;Therapeutic activities;Patient/family education   PT Goals (Current goals can be found in the Care Plan section) Acute Rehab PT Goals Patient Stated Goal: To go home PT Goal Formulation: With patient Time For Goal Achievement: 03/16/15 Potential to Achieve Goals: Good    Frequency Min 3X/week   Barriers to discharge Inaccessible home environment lives in second floor apartment    Co-evaluation               End of Session   Activity Tolerance: Patient limited by pain;Patient limited by fatigue Patient left: in bed;with call bell/phone within reach;with bed alarm set           Time: 5465-6812 PT Time Calculation (min) (ACUTE ONLY): 47 min   Charges:   PT Evaluation $Initial PT Evaluation Tier I: 1 Procedure PT Treatments $Therapeutic Activity: 8-22 mins   PT G Codes:        WYNN,CYNDI 2015-03-25, 4:40 PM  Sheran Lawless, PT [phone] 25-Mar-2015

## 2015-03-03 ENCOUNTER — Inpatient Hospital Stay (HOSPITAL_COMMUNITY): Payer: Medicare Other

## 2015-03-03 ENCOUNTER — Encounter (HOSPITAL_COMMUNITY): Payer: Self-pay | Admitting: Gastroenterology

## 2015-03-03 DIAGNOSIS — E860 Dehydration: Secondary | ICD-10-CM

## 2015-03-03 DIAGNOSIS — E039 Hypothyroidism, unspecified: Secondary | ICD-10-CM

## 2015-03-03 LAB — CBC
HCT: 23.4 % — ABNORMAL LOW (ref 36.0–46.0)
HEMOGLOBIN: 7.2 g/dL — AB (ref 12.0–15.0)
MCH: 28.8 pg (ref 26.0–34.0)
MCHC: 30.8 g/dL (ref 30.0–36.0)
MCV: 93.6 fL (ref 78.0–100.0)
Platelets: 138 10*3/uL — ABNORMAL LOW (ref 150–400)
RBC: 2.5 MIL/uL — AB (ref 3.87–5.11)
RDW: 18.9 % — ABNORMAL HIGH (ref 11.5–15.5)
WBC: 8.4 10*3/uL (ref 4.0–10.5)

## 2015-03-03 LAB — BASIC METABOLIC PANEL
Anion gap: 9 (ref 5–15)
BUN: <5 mg/dL — ABNORMAL LOW (ref 6–20)
CO2: 12 mmol/L — ABNORMAL LOW (ref 22–32)
Calcium: 6.9 mg/dL — ABNORMAL LOW (ref 8.9–10.3)
Chloride: 117 mmol/L — ABNORMAL HIGH (ref 101–111)
Creatinine, Ser: 0.61 mg/dL (ref 0.44–1.00)
GFR calc non Af Amer: >60 mL/min (ref >60)
GLUCOSE: 89 mg/dL (ref 65–99)
POTASSIUM: 5 mmol/L (ref 3.5–5.1)
Sodium: 138 mmol/L (ref 135–145)

## 2015-03-03 LAB — PREPARE RBC (CROSSMATCH)

## 2015-03-03 MED ORDER — BOOST / RESOURCE BREEZE PO LIQD
1.0000 | Freq: Three times a day (TID) | ORAL | Status: DC
  Administered 2015-03-03 – 2015-03-04 (×3): 1 via ORAL

## 2015-03-03 MED ORDER — ROSUVASTATIN CALCIUM 20 MG PO TABS
20.0000 mg | ORAL_TABLET | Freq: Every day | ORAL | Status: DC
  Administered 2015-03-03: 20 mg via ORAL
  Filled 2015-03-03 (×2): qty 1

## 2015-03-03 MED ORDER — SODIUM CHLORIDE 0.9 % IV SOLN
Freq: Once | INTRAVENOUS | Status: AC
  Administered 2015-03-03: 13:00:00 via INTRAVENOUS

## 2015-03-03 MED ORDER — CALCIUM CARBONATE 1250 (500 CA) MG PO TABS
1.0000 | ORAL_TABLET | Freq: Every day | ORAL | Status: DC
  Administered 2015-03-04: 500 mg via ORAL
  Filled 2015-03-03 (×2): qty 1

## 2015-03-03 NOTE — Progress Notes (Addendum)
Initial Nutrition Assessment  DOCUMENTATION CODES:  Severe malnutrition in context of chronic illness, Underweight  INTERVENTION:   Boost Breeze PO QID, each supplement provides 250 kcal and 9 grams of protein   NUTRITION DIAGNOSIS:  Malnutrition related to chronic illness as evidenced by severe depletion of muscle mass, severe depletion of body fat.   GOAL:  Patient will meet greater than or equal to 90% of their needs   MONITOR:  PO intake, Supplement acceptance, Labs, Weight trends  REASON FOR ASSESSMENT:  Consult Diet education; malnutrition  ASSESSMENT: Patient presented to [facility] ED with reports of fall 2 days prior to this admission. She then developed coffee ground emesis along with right upper quadrant pain. On admission, pt was hypotensive, tachycardic. Hx of Crohn's disease and chronic pain with drug seeking behavior.   Patient reports that she has eaten well today, but has been eating poorly for the past several weeks/months. She states she has been weighing in the 70's; suspects that her weight was up to 87 lbs due to fluids. She likes Boost Breeze supplements, but does not drink them at home because they are too expensive. She requested to receive them 4 times per day. She does not like the other "milky" supplements (Ensure, Boost, etc.).   Nutrition-Focused physical exam completed. Findings are severe fat depletion, severe muscle depletion, and no edema.   Labs reviewed.  Height:  Ht Readings from Last 1 Encounters:  02/28/15 5\' 4"  (1.626 m)    Weight:  Wt Readings from Last 1 Encounters:  03/01/15 87 lb 8.4 oz (39.7 kg)    Ideal Body Weight:  54.5 kg  Wt Readings from Last 10 Encounters:  03/01/15 87 lb 8.4 oz (39.7 kg)  02/02/15 76 lb (34.473 kg)  01/03/15 93 lb 14.7 oz (42.6 kg)  08/02/14 95 lb 8 oz (43.319 kg)  05/19/14 104 lb 4.8 oz (47.31 kg)  05/12/14 85 lb 15.7 oz (39 kg)  10/05/13 92 lb (41.731 kg)  10/20/12 102 lb 15.3 oz (46.7 kg)   07/30/11 94 lb 15.9 oz (43.09 kg)    HGB A1C MFR BLD  Date/Time Value Ref Range Status  05/08/2014 04:06 AM 4.9 <5.7 % Final    Comment:    (NOTE)                                                                       According to the ADA Clinical Practice Recommendations for 2011, when HbA1c is used as a screening test:  >=6.5%   Diagnostic of Diabetes Mellitus           (if abnormal result is confirmed) 5.7-6.4%   Increased risk of developing Diabetes Mellitus References:Diagnosis and Classification of Diabetes Mellitus,Diabetes Care,2011,34(Suppl 1):S62-S69 and Standards of Medical Care in         Diabetes - 2011,Diabetes Care,2011,34 (Suppl 1):S11-S61.    BMI:  Body mass index is 15.02 kg/(m^2). underweight  Estimated Nutritional Needs:  Kcal:  1250-1450  Protein:  65-75 gm  Fluid:  1.4 L  Skin:  Reviewed, no issues  Diet Order:  Diet regular Room service appropriate?: Yes; Fluid consistency:: Thin  EDUCATION NEEDS:  Education needs addressed   Intake/Output Summary (Last 24 hours) at 03/03/15 1448 Last data  filed at 03/03/15 1305  Gross per 24 hour  Intake   2110 ml  Output      0 ml  Net   2110 ml    Last BM:  6/2   Joaquin Courts, RD, LDN, CNSC Pager [phone] After Hours Pager [phone]

## 2015-03-03 NOTE — Progress Notes (Addendum)
Patient ID: [name], female   DOB: 03/21/1961, 54 y.o.   MRN: [id number] [facility] PROGRESS NOTE  BALEY SHANDS WUJ:[id number] DOB: 20-Jul-1961 DOA: 02/28/2015 PCP: Tommy Rainwater, MD  Brief narrative:    67 female with past medical history of PE on anticoagulation with Eliquis, chronic pain with drug seeking behavior, hypothyroidism, history of Crohn's disease who presented to [facility] ED with reports of fall 2 days prior to this admission. She then developed coffee ground emesis along with right upper quadrant pain. On admission, pt was hypotensive, tachycardic. Hemoglobin was 8.4. X ray and CT studies did not show acute findings. She then started having dark stools / diarrhea and had heme positive stools. She did reports taking aspirin for pain control after the fall.   She underwent EGD and colonoscopy 6/1 with no evidence of acute bleed. Her BP is low this am 71/45 so she will benefit form staying in SDU for next 24 hours.  She also will need 1 unit PRBC transfusion for hemoglobin of 7.2 this am. I spoke with the pt about discharge plan for 6/3 that she can go home if hemoglobin is stable.   Assessment/Plan:    Principal Problem: Possible upper GI bleed / Acute blood loss anemia - FOBT positive on admission with Hgb of low 7.7.  - She underwent EGD and colonoscopy 6/1 with no acute source of bleed identified - She is ok to resume Eliquis per GI which we restarted - Her Hgb is 7.2 this am so we will give 1 unit PRBC transfusion today - Follow up CBC tomorrow am  - Diet as tolerated  - Continue protonix daily   Active Problems: Crohn's disease / Chronic diarrhea - Per GI will stop sulfasalazine and pt reported she has not taken it for last few weeks PTA  Hypernatremia - Likely from dehydration - Sodium normalized with fluids   Hypokalemia / Hyperkalemia - From GI losses - Potassium supplemented and then she became hyperkalemia - She got 1 dose kayexalate 6/1  and now potassium WNL  Severe protein-calorie malnutrition / underweight / dehydration  - Liberalize the diet - Body mass index is 15.02 kg/(m^2) - Nutrition consulted   History of pulmonary embolus (PE) - Per GI, may resume Eliquis  Hypocalcemia - Will supplement  Right side rib cage pain - Obtain rib x ray for further eval - Cant get pain meds due to hypotension   Dyslipidemia - Continue Crestor   Hypothyroidism - Continue  Synthroid   DVT Prophylaxis  - SCD's bilaterally due to risk of bleeding    Code Status: Full.  Family Communication:  plan of care discussed with the patient Disposition Plan: Home likely in am if hgb stable. Needs 1 unit PRBC today.   IV access:  Peripheral IV  Procedures and diagnostic studies:    Ct Abdomen Pelvis Wo Contrast 02/28/2015   1. Study limited due to lack of contrast which particularly limits evaluation for subtle injuries to solid viscera. 2. Allowing for the above limitation, no evidence of acute injury to the chest, abdomen or pelvis. 3. 2.6 mm nodule in the right upper lobe. This is likely scarring. If the patient is at high risk for bronchogenic carcinoma, follow-up chest CT at 1 year is recommended. If the patient is at low risk, no follow-up is needed. This recommendation follows the consensus statement: Guidelines for Management of Small Pulmonary Nodules Detected on CT Scans: A Statement from the Fleischner Society as published in  Radiology 2005; 387:564-332. 4. There are chronic findings in the chest abdomen pelvis as detailed above.   Electronically Signed   By: Amie Portland M.D.   On: 02/28/2015 13:12   Ct Head Wo Contrast 02/28/2015  1. No acute intracranial abnormalities. 2. Chronic microvascular disease and brain atrophy.   Electronically Signed   By: Signa Kell M.D.   On: 02/28/2015 13:02   Ct Chest Wo Contrast 02/28/2015  1. Study limited due to lack of contrast which particularly limits evaluation for subtle injuries  to solid viscera. 2. Allowing for the above limitation, no evidence of acute injury to the chest, abdomen or pelvis. 3. 2.6 mm nodule in the right upper lobe. This is likely scarring. If the patient is at high risk for bronchogenic carcinoma, follow-up chest CT at 1 year is recommended. If the patient is at low risk, no follow-up is needed. This recommendation follows the consensus statement: Guidelines for Management of Small Pulmonary Nodules Detected on CT Scans: A Statement from the Fleischner Society as published in Radiology 2005; 237:395-400. 4. There are chronic findings in the chest abdomen pelvis as detailed above.     Medical Consultants:  Gastroenterology  Other Consultants:  EGD 6/1 Colonoscopy 6/1  IAnti-Infectives:   None    Manson Passey, MD  [facility] Pager [phone]  Time spent in minutes: 25 minutes  If 7PM-7AM, please contact night-coverage [email or web address] Password [email or web address] 03/03/2015, 11:01 AM   LOS: 3 days    HPI/Subjective: No acute overnight events. Patient still with right side rib cage pain about 6/10 in intensity. She is aware she cant get pain meds due to soft BP.  Objective: Filed Vitals:   03/03/15 0800 03/03/15 0850 03/03/15 0900 03/03/15 1000  BP: 71/45 94/78 94/60  107/80  Pulse: 80 95 75   Temp:      TempSrc:      Resp: 15 20 20 18   Height:      Weight:      SpO2: 99% 100% 99%     Intake/Output Summary (Last 24 hours) at 03/03/15 1101 Last data filed at 03/03/15 1000  Gross per 24 hour  Intake   2260 ml  Output      0 ml  Net   2260 ml    Exam:   General:  Pt is alert, not in acute distress  Cardiovascular: RRR, S1/S2 appreciated   Respiratory: bilateral air entry, no wheezing   Abdomen: non tender, non distended, (+) BS  Extremities: No leg swelling, pulses palpable   Neuro: Nonfocal  Data Reviewed: Basic Metabolic Panel:  Recent Labs Lab 02/28/15 1220 02/28/15 1243 02/28/15 1611 03/01/15 0615 03/02/15 0649  03/02/15 1128 03/03/15 0528  NA 147* 145 146* 146* 142  --  138  K 3.2* 3.0* 3.2* 2.8* 6.3* 5.3* 5.0  CL 107 107 112* 120* 121*  --  117*  CO2 23  --  26 22 14*  --  12*  GLUCOSE 132* 128* 113* 66 71  --  89  BUN 7 4* 6 8 <5*  --  <5*  CREATININE 0.99 0.80 0.92 0.78 0.63  --  0.61  CALCIUM 7.4*  --  6.5* 6.0* 6.8*  --  6.9*  MG  --   --   --   --  2.3  --   --    Liver Function Tests:  Recent Labs Lab 02/28/15 1220 02/28/15 1611 03/01/15 0615  AST 171* 143* 69*  ALT 63* 55* 37  ALKPHOS  222* 207* 157*  BILITOT 1.0 0.8 0.6  PROT 5.4* 4.7* 3.9*  ALBUMIN 2.0* 1.8* 1.5*   No results for input(s): LIPASE, AMYLASE in the last 168 hours. No results for input(s): AMMONIA in the last 168 hours. CBC:  Recent Labs Lab 03/01/15 1221 03/01/15 1956 03/02/15 0649 03/02/15 1925 03/03/15 0528  WBC 10.4 10.6* 8.1 7.8 8.4  HGB 7.9* 7.8* 7.9* 8.0* 7.2*  HCT 24.7* 25.3* 25.4* 25.3* 23.4*  MCV 89.2 91.0 90.7 90.7 93.6  PLT 210 204 183 181 138*   Cardiac Enzymes:  Recent Labs Lab 02/28/15 1611  CKTOTAL 112   BNP: Invalid input(s): POCBNP CBG: No results for input(s): GLUCAP in the last 168 hours.  Recent Results (from the past 240 hour(s))  Gram stain     Status: None   Collection Time: 02/28/15 12:27 PM  Result Value Ref Range Status   Specimen Description URINE, RANDOM  Final   Special Requests NONE  Final   Gram Stain   Final    WBC PRESENT, PREDOMINANTLY PMN Multiple bacterial morphotypes present, none predominant. Suggest appropriate recollection if clinically indicated. CYTOSPIN URINE    Report Status 02/28/2015 FINAL  Final  MRSA PCR Screening     Status: None   Collection Time: 02/28/15  8:20 PM  Result Value Ref Range Status   MRSA by PCR NEGATIVE NEGATIVE Final     Scheduled Meds: . sodium chloride   Intravenous Once  . apixaban  5 mg Oral BID  . folic acid  1 mg Oral Daily  . gabapentin  800 mg Oral TID  . levothyroxine  25 mcg Oral QAC breakfast  .  pantoprazole  40 mg Oral Daily  . polyethylene glycol  17 g Oral BID  . rosuvastatin  20 mg Oral Daily  . sodium chloride  3 mL Intravenous Q12H  . sodium polystyrene  15 g Oral Once   Continuous Infusions: . sodium chloride 100 mL/hr at 03/03/15 0912  . sodium chloride

## 2015-03-03 NOTE — Evaluation (Signed)
Occupational Therapy Evaluation Patient Details Name: [name] MRN: [id number] DOB: 02/09/1961 Today's Date: 03/03/2015    History of Present Illness Patient is a 54 y/o female admitted with history of PE on anticoagulation with Eliquis, chronic pain with drug seeking behavior, hypothyroidism, history of Crohn's disease who presented to [facility] ED with reports of fall 2 days prior to this admission. She then developed coffee ground emesis along with right upper quadrant pain   Clinical Impression   Pt admitted with above. She demonstrates the below listed deficits and will benefit from continued OT to maximize safety and independence with BADLs.  Pt presents to OT with generalized weakness, impaired balance, and increased pain.  She required mod - max A for ADLs PTA, but did ambulate to BR mod I.  Currently, she requires max - total A for ADLs and mod A for stand pivot transfers.  HR to 181 with activity, likely pain response as it decreased to 85 once she was at rest, and pain improved.  She reports her son is "in and out" during the day, but also insists he will be able to provide needed level of care at discharge. He is not present to confirm this.   She will need 24 hour assist, and therefore safest recommendation is SNF, but she will likely refuse this option, so therefore, recommend HHOT      Follow Up Recommendations  SNF;Supervision/Assistance - 24 hour.  Pt will likely refuse SNF, therefore, HHOT recommended     Equipment Recommendations  Other (comment) (Pt requesting hospital bed be replaced, she reports hospital bed she received last hospitalization is not meeting her needs )    Recommendations for Other Services       Precautions / Restrictions Precautions Precautions: Fall      Mobility Bed Mobility Overal bed mobility: Needs Assistance Bed Mobility: Supine to Sit     Supine to sit: Min assist;HOB elevated     General bed mobility comments: Heavy reliance on bed  rail.  Pt. would not allow therapist to lower the bed   Transfers Overall transfer level: Needs assistance Equipment used: 1 person hand held assist Transfers: Sit to/from Stand;Stand Pivot Transfers Sit to Stand: Mod assist Stand pivot transfers: Mod assist       General transfer comment: Mod A to power up into standing.  Assist to weight shift and advance LE     Balance Overall balance assessment: Needs assistance Sitting-balance support: Feet supported Sitting balance-Leahy Scale: Fair     Standing balance support: Single extremity supported Standing balance-Leahy Scale: Poor                              ADL Overall ADL's : Needs assistance/impaired Eating/Feeding: Independent   Grooming: Wash/dry hands;Wash/dry face;Oral care;Set up;Sitting;Bed level   Upper Body Bathing: Moderate assistance;Sitting;Bed level   Lower Body Bathing: Maximal assistance;Bed level   Upper Body Dressing : Moderate assistance;Sitting;Bed level   Lower Body Dressing: Total assistance;Sit to/from stand;Bed level   Toilet Transfer: Moderate assistance;Stand-pivot;BSC   Toileting- Clothing Manipulation and Hygiene: Moderate assistance;Sit to/from stand       Functional mobility during ADLs: Moderate assistance General ADL Comments: Pt limited by pain.  She reports her level of assist varies at home dependind on how she feels      Vision     Perception     Praxis      Pertinent Vitals/Pain Pain Assessment: No/denies  pain Faces Pain Scale: Hurts worst Pain Location: Rt chest under her breast  Pain Descriptors / Indicators: Aching;Crying;Grimacing;Guarding;Sharp Pain Intervention(s): Limited activity within patient's tolerance;Repositioned;Monitored during session;Patient requesting pain meds-RN notified     Hand Dominance Left   Extremity/Trunk Assessment Upper Extremity Assessment Upper Extremity Assessment: Generalized weakness   Lower Extremity  Assessment Lower Extremity Assessment: Defer to PT evaluation   Cervical / Trunk Assessment Cervical / Trunk Assessment: Kyphotic;Other exceptions Cervical / Trunk Exceptions: globally weak, doesn't hold her head up in sitting   Communication     Cognition Arousal/Alertness: Awake/alert Behavior During Therapy: WFL for tasks assessed/performed Overall Cognitive Status: No family/caregiver present to determine baseline cognitive functioning                     General Comments       Exercises       Shoulder Instructions      Home Living Family/patient expects to be discharged to:: Private residence Living Arrangements: Children Available Help at Discharge: Personal care attendant;Available PRN/intermittently;Family (CAP aide 3 hours a day, states can increase to 6 hours) Type of Home: Apartment Home Access: Stairs to enter [employer] of Steps: 6 steps with L rail to landing, 6 steps with R rail to apartment   Home Layout: One level     Bathroom Shower/Tub: Tub/shower unit;Curtain Shower/tub characteristics: Engineer, building services: Standard Bathroom Accessibility: Yes   Home Equipment: Environmental consultant - 2 wheels;Cane - single point;Bedside commode;Hospital bed;Tub bench (Pt reports she wants a new hospital bed )   Additional Comments: Son not employed however not always at home so patient is home alone.  She reports she has an aide 2-3 hours/day up to 7 days a week.  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  She states she has been told she has been approved for CAPs, but has not yet received her confirmation letter.        Prior Functioning/Environment Level of Independence: Needs assistance  Gait / Transfers Assistance Needed: Pt reports she ambulated short distances with SPC.  She reports that the RW does not move well on her carpet  ADL's / Homemaking Assistance Needed: Pt reports her aid performs most to all of her ADLs and son  assists with IADLs         OT Diagnosis: Generalized weakness;Acute pain   OT Problem List: Decreased strength;Decreased activity tolerance;Impaired balance (sitting and/or standing);Decreased knowledge of use of DME or AE;Cardiopulmonary status limiting activity;Pain   OT Treatment/Interventions: Self-care/ADL training;DME and/or AE instruction;Therapeutic exercise;Therapeutic activities;Balance training;Patient/family education    OT Goals(Current goals can be found in the care plan section) Acute Rehab OT Goals Patient Stated Goal: to go home  OT Goal Formulation: With patient Time For Goal Achievement: 03/03/15 Potential to Achieve Goals: Good ADL Goals Pt Will Transfer to Toilet: with min assist;ambulating;regular height toilet;grab bars;bedside commode Pt Will Perform Toileting - Clothing Manipulation and hygiene: with min assist;sit to/from stand Pt Will Perform Tub/Shower Transfer: with min assist;ambulating;tub bench;rolling walker  OT Frequency: Min 2X/week   Barriers to D/C: Decreased caregiver support (uncertain if son can provide necessary assist )          Co-evaluation              End of Session Nurse Communication: Mobility status  Activity Tolerance: Patient limited by pain Patient left: in chair;with call bell/phone within reach   Time: 1610-9604 OT Time Calculation (min): 33 min  Charges:  OT General Charges $OT Visit: 1 Procedure OT Evaluation $Initial OT Evaluation Tier I: 1 Procedure OT Treatments $Therapeutic Activity: 8-22 mins G-Codes:    Xian Alves M Mar 04, 2015, 12:21 PM

## 2015-03-04 DIAGNOSIS — W19XXXS Unspecified fall, sequela: Secondary | ICD-10-CM

## 2015-03-04 DIAGNOSIS — Z86711 Personal history of pulmonary embolism: Secondary | ICD-10-CM

## 2015-03-04 LAB — CBC
HCT: 31.2 % — ABNORMAL LOW (ref 36.0–46.0)
HCT: 31.6 % — ABNORMAL LOW (ref 36.0–46.0)
HEMOGLOBIN: 9.9 g/dL — AB (ref 12.0–15.0)
Hemoglobin: 10 g/dL — ABNORMAL LOW (ref 12.0–15.0)
MCH: 29.1 pg (ref 26.0–34.0)
MCH: 28.5 pg (ref 26.0–34.0)
MCHC: 31.7 g/dL (ref 30.0–36.0)
MCHC: 31.6 g/dL (ref 30.0–36.0)
MCV: 91.8 fL (ref 78.0–100.0)
MCV: 90 fL (ref 78.0–100.0)
Platelets: 123 10*3/uL — ABNORMAL LOW (ref 150–400)
Platelets: 123 10*3/uL — ABNORMAL LOW (ref 150–400)
RBC: 3.4 MIL/uL — ABNORMAL LOW (ref 3.87–5.11)
RBC: 3.51 MIL/uL — AB (ref 3.87–5.11)
RDW: 17.4 % — AB (ref 11.5–15.5)
RDW: 17.9 % — AB (ref 11.5–15.5)
WBC: 10.2 10*3/uL (ref 4.0–10.5)
WBC: 8.5 10*3/uL (ref 4.0–10.5)

## 2015-03-04 LAB — TYPE AND SCREEN
ABO/RH(D): B POS
Antibody Screen: NEGATIVE
UNIT DIVISION: 0

## 2015-03-04 LAB — BASIC METABOLIC PANEL
ANION GAP: 5 (ref 5–15)
BUN: <5 mg/dL — ABNORMAL LOW (ref 6–20)
CALCIUM: 7.1 mg/dL — AB (ref 8.9–10.3)
CO2: 18 mmol/L — ABNORMAL LOW (ref 22–32)
Chloride: 119 mmol/L — ABNORMAL HIGH (ref 101–111)
Creatinine, Ser: 0.6 mg/dL (ref 0.44–1.00)
GFR calc Af Amer: >60 mL/min (ref >60)
GFR calc non Af Amer: >60 mL/min (ref >60)
GLUCOSE: 130 mg/dL — AB (ref 65–99)
Potassium: 4.3 mmol/L (ref 3.5–5.1)
Sodium: 142 mmol/L (ref 135–145)

## 2015-03-04 LAB — PROCALCITONIN: Procalcitonin: <0.1 ng/mL

## 2015-03-04 MED ORDER — VENLAFAXINE HCL 25 MG PO TABS: 25.0000 mg | ORAL_TABLET | Freq: Two times a day (BID) | ORAL | Status: DC

## 2015-03-04 MED ORDER — CALCIUM CARBONATE 1250 (500 CA) MG PO TABS: 1.0000 | ORAL_TABLET | Freq: Every day | ORAL | Status: DC

## 2015-03-04 MED ORDER — GABAPENTIN 800 MG PO TABS: 800.0000 mg | ORAL_TABLET | Freq: Three times a day (TID) | ORAL | Status: DC

## 2015-03-04 NOTE — Progress Notes (Signed)
CM consulted to arrange [facility] services for RN/PT/OT. Pt choice given , pt states has had relationship with [facility] in the past and has agreed to use them again. Referral made with [facility].Lupita Leash) @ [phone]. Services are to begin 24-48 hrs post d/c, pt made aware. No other needs identified per CM. Gae Gallop RN,BSN,CM [phone]

## 2015-03-04 NOTE — Discharge Summary (Addendum)
Physician Discharge Summary  [name]:[id number] DOB: 05-Feb-1961 DOA: 02/28/2015  PCP: Tommy Rainwater, MD  Admit date: 02/28/2015 Discharge date: 03/04/2015  Recommendations for Outpatient Follow-up:  1. Pt will follow up with PCP in 1 week to make sure hemoglobin is stable  Of note, I went to see the patient into the room. When I asked her if she feels ready to go home today (after i have informed her of hemoglobin being 10 and her vitals being stable) she started yelling at me and calling me a liar, saying we have never discussed discharge. I have reminded her that we talked about that GI (Dr. Matthias Hughs) has mentioned she was stable to go home even 6/2 but because she mentioned to me she needs to arrange transportation i have decided to keep her 24 hours in hospital and use the opportunity to get x ray of her ribs and give her additional unit of PRBC.  She was upset that she had rib fracture and that her pain was largely uncontrolled. Due to soft BP we were limited in how much pain med she get for pain relief. She was apparently (per RN) very rude to the staff overnight, cursing).  I have informed the pt again that medically she is stable for discharge. Her option is to appeal the discharge but medically she is stable. Her blood pressure is 120/87 and her hemoglobin today is 10. I spoke with patient's sister over the phone and acknowledged that patient is stable medically for discharge. She said she will await patient at the patient's house.  Discharge Diagnoses:  Active Problems:   DM2 (diabetes mellitus, type 2)   HTN (hypertension)   Asthma   Crohn's disease   Metabolic acidosis   Hypokalemia   Normocytic anemia   Physical deconditioning   Heme positive stool   History of pulmonary embolus (PE)   Dehydration   Chest pain, musculoskeletal   Hepatic steatosis   Chronic diarrhea of unknown origin   Severe protein-calorie malnutrition   Fall at home   Chronic  pain    Discharge Condition: stable   Diet recommendation: as tolerated   History of present illness:  63 female with past medical history of PE on anticoagulation with Eliquis, chronic pain with drug seeking behavior, hypothyroidism, history of Crohn's disease who presented to [facility] ED with reports of fall 2 days prior to this admission. She then developed coffee ground emesis along with right upper quadrant pain. On admission, pt was hypotensive, tachycardic. Hemoglobin was 8.4. X ray and CT studies did not show acute findings. She then started having dark stools / diarrhea and had heme positive stools. She did reports taking aspirin for pain control after the fall.   She underwent EGD and colonoscopy 6/1 with no evidence of acute bleed. She has received 1 unit of PRBC transfusion 03/03/2015. Her hemoglobin is stable this morning and she is medically stable for discharge.  Hospital Course:   Assessment/Plan:    Principal Problem: Possible upper GI bleed / Acute blood loss anemia - FOBT positive on admission with Hgb of low 7.7.  - She underwent EGD and colonoscopy 6/1 with no evidence of acute bleed. - Per GI, Eliquis was okay to be resumed. - She has received 1 unit of PRBC transfusion 03/03/2015. - Hemoglobin is 10 this morning. - Continue Protonix on discharge.   Active Problems: Crohn's disease / Chronic diarrhea - Per GI will stop sulfasalazine on discharge.  Hypernatremia - Likely from  dehydration - Sodium normalized with fluids   Hypokalemia / Hyperkalemia - From GI losses - Potassium supplemented and then she became hyperkalemic - At the Kayexalate, potassium normalized. Potassium is 4.3 today.  Severe protein-calorie malnutrition / underweight / dehydration  - Liberalize the diet. Nutritionist has seen the pt in consultation.  - Body mass index is 15.02 kg/(m^2)  History of pulmonary embolus (PE) - Continue Eliquis  Hypocalcemia - Supplemented   Right side  rib cage pain / rib fracture / Fall - Has had a fall at home. X-ray of the ribs on the right side reveals suspected 8 rib fracture, nondisplaced. - Continue supportive care.  Dyslipidemia - Continue Crestor   Hypothyroidism - Continue Synthroid   DVT Prophylaxis  - SCD's bilaterally   Code Status: Full.  Family Communication: plan of care discussed with the patient    IV access:  Peripheral IV  Procedures and diagnostic studies:   Ct Abdomen Pelvis Wo Contrast 02/28/2015 1. Study limited due to lack of contrast which particularly limits evaluation for subtle injuries to solid viscera. 2. Allowing for the above limitation, no evidence of acute injury to the chest, abdomen or pelvis. 3. 2.6 mm nodule in the right upper lobe. This is likely scarring. If the patient is at high risk for bronchogenic carcinoma, follow-up chest CT at 1 year is recommended. If the patient is at low risk, no follow-up is needed. This recommendation follows the consensus statement: Guidelines for Management of Small Pulmonary Nodules Detected on CT Scans: A Statement from the Fleischner Society as published in Radiology 2005; 237:395-400. 4. There are chronic findings in the chest abdomen pelvis as detailed above. Electronically Signed By: Amie Portland M.D. On: 02/28/2015 13:12   Ct Head Wo Contrast 02/28/2015 1. No acute intracranial abnormalities. 2. Chronic microvascular disease and brain atrophy. Electronically Signed By: Signa Kell M.D. On: 02/28/2015 13:02   Ct Chest Wo Contrast 02/28/2015 1. Study limited due to lack of contrast which particularly limits evaluation for subtle injuries to solid viscera. 2. Allowing for the above limitation, no evidence of acute injury to the chest, abdomen or pelvis. 3. 2.6 mm nodule in the right upper lobe. This is likely scarring. If the patient is at high risk for bronchogenic carcinoma, follow-up chest CT at 1 year is recommended. If the  patient is at low risk, no follow-up is needed. This recommendation follows the consensus statement: Guidelines for Management of Small Pulmonary Nodules Detected on CT Scans: A Statement from the Fleischner Society as published in Radiology 2005; 237:395-400. 4. There are chronic findings in the chest abdomen pelvis as detailed above.   Medical Consultants:  Gastroenterology  Other Consultants:  EGD 6/1 Colonoscopy 6/1  IAnti-Infectives:   None    Signed:  Manson Passey, MD  [facility] 03/04/2015, 9:54 AM  Pager #: [phone]  Time spent in minutes: more than 30 minutes    Discharge Exam: Filed Vitals:   03/04/15 0741  BP: 120/87  Pulse:   Temp: 98.1 F (36.7 C)  Resp: 19   Filed Vitals:   03/03/15 2306 03/04/15 0317 03/04/15 0330 03/04/15 0741  BP: 125/85 120/73  120/87  Pulse:  75    Temp: 98.1 F (36.7 C) 98.4 F (36.9 C)  98.1 F (36.7 C)  TempSrc: Oral Oral  Oral  Resp: 19   19  Height:      Weight:   46.4 kg (102 lb 4.7 oz)   SpO2: 97% 98%  98%  General: Pt is alert, follows commands appropriately, not in acute distress Cardiovascular: Regular rate and rhythm, S1/S2 +, no murmurs Respiratory: Clear to auscultation bilaterally, no wheezing, no crackles, no rhonchi Abdominal: Soft, non tender, non distended, bowel sounds +, no guarding Extremities: no edema, no cyanosis, pulses palpable bilaterally DP and PT Neuro: Grossly nonfocal  Discharge Instructions  Discharge Instructions    Call MD for:  difficulty breathing, headache or visual disturbances    Complete by:  As directed      Call MD for:  persistant nausea and vomiting    Complete by:  As directed      Call MD for:  severe uncontrolled pain    Complete by:  As directed      Diet - low sodium heart healthy    Complete by:  As directed      Increase activity slowly    Complete by:  As directed             Medication List    STOP taking these medications         alprazolam 2 MG tablet  Commonly known as:  XANAX     aspirin 81 MG EC tablet     HYDROmorphone 4 MG tablet  Commonly known as:  DILAUDID     potassium chloride SA 20 MEQ tablet  Commonly known as:  K-DUR,KLOR-CON     sucralfate 1 G tablet  Commonly known as:  CARAFATE     sulfaSALAzine 500 MG tablet  Commonly known as:  AZULFIDINE      TAKE these medications        apixaban 5 MG Tabs tablet  Commonly known as:  ELIQUIS  Take 5 mg by mouth 2 (two) times daily.     calcium carbonate 1250 (500 CA) MG tablet  Commonly known as:  OS-CAL - dosed in mg of elemental calcium  Take 1 tablet (500 mg of elemental calcium total) by mouth daily with breakfast.     feeding supplement (RESOURCE BREEZE) Liqd  Take 1 Container by mouth 3 (three) times daily between meals.     folic acid 1 MG tablet  Commonly known as:  FOLVITE  Take 1 tablet (1 mg total) by mouth daily.     gabapentin 800 MG tablet  Commonly known as:  NEURONTIN  Take 1 tablet (800 mg total) by mouth 3 (three) times daily.     levothyroxine 25 MCG tablet  Commonly known as:  SYNTHROID, LEVOTHROID  Take 1 tablet (25 mcg total) by mouth daily before breakfast.     meclizine 25 MG tablet  Commonly known as:  ANTIVERT  Take 25 mg by mouth 3 (three) times daily as needed for dizziness.     naproxen sodium 220 MG tablet  Commonly known as:  ANAPROX  Take 220 mg by mouth as needed (for pain).     ondansetron 4 MG tablet  Commonly known as:  ZOFRAN  Take 4 mg by mouth every 8 (eight) hours as needed for nausea or vomiting.     pantoprazole 20 MG tablet  Commonly known as:  PROTONIX  Take 2 tablets (40 mg total) by mouth daily.     rosuvastatin 20 MG tablet  Commonly known as:  CRESTOR  Take 1 tablet (20 mg total) by mouth daily.     thiamine 100 MG tablet  Commonly known as:  VITAMIN B-1  Take 1 tablet (100 mg total) by mouth daily.  traZODone 25 mg Tabs tablet  Commonly known as:  DESYREL  Take 0.5  tablets (25 mg total) by mouth at bedtime as needed for sleep.     venlafaxine 25 MG tablet  Commonly known as:  EFFEXOR  Take 1 tablet (25 mg total) by mouth 2 (two) times daily.           Follow-up Information    Follow up with Shamleffer, Landry Mellow, MD. Schedule an appointment as soon as possible for a visit in 1 week.   Specialty:  Internal Medicine   Why:  Follow up appt after recent hospitalization   Contact information:   [address]  [address] [place] [address] [phone]        The results of significant diagnostics from this hospitalization (including imaging, microbiology, ancillary and laboratory) are listed below for reference.    Significant Diagnostic Studies: Ct Abdomen Pelvis Wo Contrast  02/28/2015   CLINICAL DATA:  c/o fall sat night 02/26/2015 , pt is c/o some slight neck pain but mostly c/o right rib pain Limited ROM Pt very sore to bilateral arms Hx of HTN Hx of neuropathy Hx of chrohn's  EXAM: CT CHEST, ABDOMEN AND PELVIS WITHOUT CONTRAST  This exam is limited for trauma given lack of intravenous contrast.  TECHNIQUE: Multidetector CT imaging of the chest, abdomen and pelvis was performed following the standard protocol without IV contrast.  COMPARISON:  12/28/2014  FINDINGS: CT CHEST FINDINGS  Thoracic inlet:  No masses.  No adenopathy.  Mediastinum and hila: Heart normal in size and configuration. Dense coronary artery calcifications. Great vessels normal in caliber. No mediastinal or hilar masses or evidence of adenopathy.  Lungs and pleura: 2.6 mm nodular opacity in the right upper lobe, image 9, series 3. Lungs otherwise clear. No pleural effusion. No pneumothorax.  CT ABDOMEN AND PELVIS FINDINGS  Liver: There is lesion at the dome of the liver with a increased attenuation margin, currently measuring 2.3 cm by 1.8 cm, unchanged from prior exam. No other liver abnormality.  Spleen and pancreas:  Unremarkable.  Gallbladder and biliary tree:  Gallbladder unremarkable. Common bile duct is dilated to 1 cm in the pancreatic head with abrupt tapering. This is unchanged from the prior study.  Adrenal glands:  No masses.  Kidneys, ureters, bladder: Small bilateral nonobstructing intrarenal stones. No renal masses. No hydronephrosis. Ureters normal course and caliber. Bladder is mostly decompressed but otherwise unremarkable.  Uterus and adnexa:  Unremarkable.  Lymph nodes:  No adenopathy.  Ascites:  None.  GI tract: Unremarkable. No evidence of a bowel hematoma or mesenteric hematoma.  Vascular: Atherosclerotic calcifications along the aorta and its branch vessels. No aneurysm.  MUSCULOSKELETAL  No fracture.  No osteoblastic or osteolytic lesions.  IMPRESSION: 1. Study limited due to lack of contrast which particularly limits evaluation for subtle injuries to solid viscera. 2. Allowing for the above limitation, no evidence of acute injury to the chest, abdomen or pelvis. 3. 2.6 mm nodule in the right upper lobe. This is likely scarring. If the patient is at high risk for bronchogenic carcinoma, follow-up chest CT at 1 year is recommended. If the patient is at low risk, no follow-up is needed. This recommendation follows the consensus statement: Guidelines for Management of Small Pulmonary Nodules Detected on CT Scans: A Statement from the Fleischner Society as published in Radiology 2005; 237:395-400. 4. There are chronic findings in the chest abdomen pelvis as detailed above.   Electronically Signed   By:  Amie Portland M.D.   On: 02/28/2015 13:12   Dg Ribs Unilateral W/chest Right  03/03/2015   CLINICAL DATA:  Right anterior chest pain  EXAM: RIGHT RIBS AND CHEST - 3+ VIEW  COMPARISON:  02/11/2015  FINDINGS: Cortical irregularity involving the anterior aspect of the eighth rib is identified and may represent a nondisplaced fracture. There is no evidence of pneumothorax or pleural effusion. Both lungs are clear. Heart size and mediastinal contours are within  normal limits.  IMPRESSION: Suspect nondisplaced fracture involving the anterior aspect of the right eighth rib.   Electronically Signed   By: Signa Kell M.D.   On: 03/03/2015 17:29   Ct Head Wo Contrast  02/28/2015   CLINICAL DATA:  Status post fall.  EXAM: CT HEAD WITHOUT CONTRAST  TECHNIQUE: Contiguous axial images were obtained from the base of the skull through the vertex without intravenous contrast.  COMPARISON:  12/28/2014  FINDINGS: There is prominence of the sulci and ventricles consistent with brain atrophy. Mild diffuse low attenuation throughout the subcortical and periventricular white matter is noted consistent with brain atrophy. There is no evidence for acute intracranial hemorrhage, cortical infarct or mass. No abnormal extra-axial fluid collections identified. The paranasal sinuses are clear. The mastoid air cells are also clear. Normal appearance of the calvarium.  IMPRESSION: 1. No acute intracranial abnormalities. 2. Chronic microvascular disease and brain atrophy.   Electronically Signed   By: Signa Kell M.D.   On: 02/28/2015 13:02   Ct Chest Wo Contrast  02/28/2015   CLINICAL DATA:  c/o fall sat night 02/26/2015 , pt is c/o some slight neck pain but mostly c/o right rib pain Limited ROM Pt very sore to bilateral arms Hx of HTN Hx of neuropathy Hx of chrohn's  EXAM: CT CHEST, ABDOMEN AND PELVIS WITHOUT CONTRAST  This exam is limited for trauma given lack of intravenous contrast.  TECHNIQUE: Multidetector CT imaging of the chest, abdomen and pelvis was performed following the standard protocol without IV contrast.  COMPARISON:  12/28/2014  FINDINGS: CT CHEST FINDINGS  Thoracic inlet:  No masses.  No adenopathy.  Mediastinum and hila: Heart normal in size and configuration. Dense coronary artery calcifications. Great vessels normal in caliber. No mediastinal or hilar masses or evidence of adenopathy.  Lungs and pleura: 2.6 mm nodular opacity in the right upper lobe, image 9, series  3. Lungs otherwise clear. No pleural effusion. No pneumothorax.  CT ABDOMEN AND PELVIS FINDINGS  Liver: There is lesion at the dome of the liver with a increased attenuation margin, currently measuring 2.3 cm by 1.8 cm, unchanged from prior exam. No other liver abnormality.  Spleen and pancreas:  Unremarkable.  Gallbladder and biliary tree: Gallbladder unremarkable. Common bile duct is dilated to 1 cm in the pancreatic head with abrupt tapering. This is unchanged from the prior study.  Adrenal glands:  No masses.  Kidneys, ureters, bladder: Small bilateral nonobstructing intrarenal stones. No renal masses. No hydronephrosis. Ureters normal course and caliber. Bladder is mostly decompressed but otherwise unremarkable.  Uterus and adnexa:  Unremarkable.  Lymph nodes:  No adenopathy.  Ascites:  None.  GI tract: Unremarkable. No evidence of a bowel hematoma or mesenteric hematoma.  Vascular: Atherosclerotic calcifications along the aorta and its branch vessels. No aneurysm.  MUSCULOSKELETAL  No fracture.  No osteoblastic or osteolytic lesions.  IMPRESSION: 1. Study limited due to lack of contrast which particularly limits evaluation for subtle injuries to solid viscera. 2. Allowing for the above limitation, no  evidence of acute injury to the chest, abdomen or pelvis. 3. 2.6 mm nodule in the right upper lobe. This is likely scarring. If the patient is at high risk for bronchogenic carcinoma, follow-up chest CT at 1 year is recommended. If the patient is at low risk, no follow-up is needed. This recommendation follows the consensus statement: Guidelines for Management of Small Pulmonary Nodules Detected on CT Scans: A Statement from the Fleischner Society as published in Radiology 2005; 237:395-400. 4. There are chronic findings in the chest abdomen pelvis as detailed above.   Electronically Signed   By: Amie Portland M.D.   On: 02/28/2015 13:12    Microbiology: Recent Results (from the past 240 hour(s))  Gram stain      Status: None   Collection Time: 02/28/15 12:27 PM  Result Value Ref Range Status   Specimen Description URINE, RANDOM  Final   Special Requests NONE  Final   Gram Stain   Final    WBC PRESENT, PREDOMINANTLY PMN Multiple bacterial morphotypes present, none predominant. Suggest appropriate recollection if clinically indicated. CYTOSPIN URINE    Report Status 02/28/2015 FINAL  Final  MRSA PCR Screening     Status: None   Collection Time: 02/28/15  8:20 PM  Result Value Ref Range Status   MRSA by PCR NEGATIVE NEGATIVE Final     Labs: Basic Metabolic Panel:  Recent Labs Lab 02/28/15 1611 03/01/15 0615 03/02/15 0649 03/02/15 1128 03/03/15 0528 03/04/15 0519  NA 146* 146* 142  --  138 142  K 3.2* 2.8* 6.3* 5.3* 5.0 4.3  CL 112* 120* 121*  --  117* 119*  CO2 26 22 14*  --  12* 18*  GLUCOSE 113* 66 71  --  89 130*  BUN 6 8 <5*  --  <5* <5*  CREATININE 0.92 0.78 0.63  --  0.61 0.60  CALCIUM 6.5* 6.0* 6.8*  --  6.9* 7.1*  MG  --   --  2.3  --   --   --    Liver Function Tests:  Recent Labs Lab 02/28/15 1220 02/28/15 1611 03/01/15 0615  AST 171* 143* 69*  ALT 63* 55* 37  ALKPHOS 222* 207* 157*  BILITOT 1.0 0.8 0.6  PROT 5.4* 4.7* 3.9*  ALBUMIN 2.0* 1.8* 1.5*   No results for input(s): LIPASE, AMYLASE in the last 168 hours. No results for input(s): AMMONIA in the last 168 hours. CBC:  Recent Labs Lab 03/02/15 0649 03/02/15 1925 03/03/15 0528 03/03/15 2302 03/04/15 0519  WBC 8.1 7.8 8.4 10.2 8.5  HGB 7.9* 8.0* 7.2* 9.9* 10.0*  HCT 25.4* 25.3* 23.4* 31.2* 31.6*  MCV 90.7 90.7 93.6 91.8 90.0  PLT 183 181 138* 123* 123*   Cardiac Enzymes:  Recent Labs Lab 02/28/15 1611  CKTOTAL 112   BNP: BNP (last 3 results)  Recent Labs  12/28/14 1905  BNP 77.5    ProBNP (last 3 results)  Recent Labs  07/26/14 1138  PROBNP 440.7*    CBG: No results for input(s): GLUCAP in the last 168 hours.

## 2015-03-04 NOTE — Discharge Instructions (Signed)
Anemia, Nonspecific Anemia is a condition in which the concentration of red blood cells or hemoglobin in the blood is below normal. Hemoglobin is a substance in red blood cells that carries oxygen to the tissues of the body. Anemia results in not enough oxygen reaching these tissues.  CAUSES  Common causes of anemia include:   Excessive bleeding. Bleeding may be internal or external. This includes excessive bleeding from periods (in women) or from the intestine.   Poor nutrition.   Chronic kidney, thyroid, and liver disease.  Bone marrow disorders that decrease red blood cell production.  Cancer and treatments for cancer.  HIV, AIDS, and their treatments.  Spleen problems that increase red blood cell destruction.  Blood disorders.  Excess destruction of red blood cells due to infection, medicines, and autoimmune disorders. SIGNS AND SYMPTOMS   Minor weakness.   Dizziness.   Headache.  Palpitations.   Shortness of breath, especially with exercise.   Paleness.  Cold sensitivity.  Indigestion.  Nausea.  Difficulty sleeping.  Difficulty concentrating. Symptoms may occur suddenly or they may develop slowly.  DIAGNOSIS  Additional blood tests are often needed. These help your health care provider determine the best treatment. Your health care provider will check your stool for blood and look for other causes of blood loss.  TREATMENT  Treatment varies depending on the cause of the anemia. Treatment can include:   Supplements of iron, vitamin B12, or folic acid.   Hormone medicines.   A blood transfusion. This may be needed if blood loss is severe.   Hospitalization. This may be needed if there is significant continual blood loss.   Dietary changes.  Spleen removal. HOME CARE INSTRUCTIONS Keep all follow-up appointments. It often takes many weeks to correct anemia, and having your health care provider check on your condition and your response to  treatment is very important. SEEK IMMEDIATE MEDICAL CARE IF:   You develop extreme weakness, shortness of breath, or chest pain.   You become dizzy or have trouble concentrating.  You develop heavy vaginal bleeding.   You develop a rash.   You have bloody or black, tarry stools.   You faint.   You vomit up blood.   You vomit repeatedly.   You have abdominal pain.  You have a fever or persistent symptoms for more than 2-3 days.   You have a fever and your symptoms suddenly get worse.   You are dehydrated.  MAKE SURE YOU:  Understand these instructions.  Will watch your condition.  Will get help right away if you are not doing well or get worse. Document Released: 10/25/2004 Document Revised: 05/20/2013 Document Reviewed: 03/13/2013 ExitCare Patient Information 2015 [employer]. This information is not intended to replace advice given to you by your health care provider. Make sure you discuss any questions you have with your health care provider.  

## 2015-03-04 NOTE — Progress Notes (Signed)
Ellouise Newer to be D/C'd Home per MD order.  Discussed with the patient and all questions fully answered.  VSS, Skin clean, dry and intact without evidence of skin break down, no evidence of skin tears noted. IV catheter discontinued intact. Site without signs and symptoms of complications. Dressing and pressure applied.  An After Visit Summary was printed and given to the patient. Patient received prescription.  D/c education completed with patient/family including follow up instructions, medication list, d/c activities limitations if indicated, with other d/c instructions as indicated by MD - patient able to verbalize understanding, all questions fully answered.   Patient instructed to return to ED, call 911, or call MD for any changes in condition.   Patient escorted via WC, and D/C home via private auto.  Osvaldo Human [facility] 03/04/2015 1:01 PM

## 2015-03-04 NOTE — Clinical Social Work Note (Signed)
CSW Consult Acknowledged:   CSW received a consult for SNF placement. CSW met the pt at bedside. Pt declined SNF. Pt reported that she linked with [facility]. Pt reported that she will give them a call this morning to see which [place] they will link her with. CSW will informed the case manager. CSW will sign off.     [place], MSW, [place]

## 2015-03-04 NOTE — Progress Notes (Signed)
I spoke with patient's sister, Erie Noe at [phone]. I have informed her that patient is medically stable for discharge. Erie Noe said she will be at the patient's home awaiting for her. She is not able to pick her up but we'll wait for her at patient's home. I also tried to get in touch with the patient's son at [phone] but the person who answered the phone said "there is no family here".  Manson Passey [facility] [phone]

## 2015-03-11 ENCOUNTER — Other Ambulatory Visit: Payer: Self-pay | Admitting: *Deleted

## 2015-03-11 NOTE — Patient Outreach (Signed)
[facility]) Care Management  03/11/2015  [name] Nov 06, 1960 [id number] Care coordination phone call to patient to follow up on in home aid assistance, transportation, physical therapy and DME equipment.  Patient not available to talk.   CSW to call back next week.  [name] Care Management [phone]

## 2015-03-21 ENCOUNTER — Encounter (HOSPITAL_COMMUNITY): Payer: Self-pay | Admitting: General Practice

## 2015-03-21 ENCOUNTER — Observation Stay (HOSPITAL_COMMUNITY)
Admission: EM | Admit: 2015-03-21 | Discharge: 2015-03-24 | Disposition: A | Discharge status: Home / Self Care | Payer: Medicare Other | Attending: Internal Medicine | Admitting: Internal Medicine

## 2015-03-21 ENCOUNTER — Emergency Department (HOSPITAL_COMMUNITY): Payer: Medicare Other

## 2015-03-21 ENCOUNTER — Observation Stay (HOSPITAL_COMMUNITY): Payer: Medicare Other

## 2015-03-21 DIAGNOSIS — E039 Hypothyroidism, unspecified: Secondary | ICD-10-CM | POA: Diagnosis not present

## 2015-03-21 DIAGNOSIS — G8929 Other chronic pain: Secondary | ICD-10-CM | POA: Diagnosis not present

## 2015-03-21 DIAGNOSIS — E119 Type 2 diabetes mellitus without complications: Secondary | ICD-10-CM | POA: Insufficient documentation

## 2015-03-21 DIAGNOSIS — Z681 Body mass index (BMI) 19 or less, adult: Secondary | ICD-10-CM | POA: Diagnosis not present

## 2015-03-21 DIAGNOSIS — I10 Essential (primary) hypertension: Secondary | ICD-10-CM | POA: Diagnosis not present

## 2015-03-21 DIAGNOSIS — R52 Pain, unspecified: Secondary | ICD-10-CM | POA: Diagnosis not present

## 2015-03-21 DIAGNOSIS — R29898 Other symptoms and signs involving the musculoskeletal system: Secondary | ICD-10-CM | POA: Diagnosis present

## 2015-03-21 DIAGNOSIS — M549 Dorsalgia, unspecified: Principal | ICD-10-CM

## 2015-03-21 DIAGNOSIS — F17210 Nicotine dependence, cigarettes, uncomplicated: Secondary | ICD-10-CM | POA: Diagnosis not present

## 2015-03-21 DIAGNOSIS — R002 Palpitations: Secondary | ICD-10-CM | POA: Diagnosis not present

## 2015-03-21 DIAGNOSIS — E860 Dehydration: Secondary | ICD-10-CM | POA: Insufficient documentation

## 2015-03-21 DIAGNOSIS — Z7289 Other problems related to lifestyle: Secondary | ICD-10-CM | POA: Insufficient documentation

## 2015-03-21 DIAGNOSIS — Z7901 Long term (current) use of anticoagulants: Secondary | ICD-10-CM | POA: Diagnosis not present

## 2015-03-21 DIAGNOSIS — R531 Weakness: Secondary | ICD-10-CM | POA: Insufficient documentation

## 2015-03-21 DIAGNOSIS — I2782 Chronic pulmonary embolism: Secondary | ICD-10-CM | POA: Insufficient documentation

## 2015-03-21 DIAGNOSIS — R262 Difficulty in walking, not elsewhere classified: Secondary | ICD-10-CM | POA: Diagnosis not present

## 2015-03-21 DIAGNOSIS — E43 Unspecified severe protein-calorie malnutrition: Secondary | ICD-10-CM | POA: Diagnosis not present

## 2015-03-21 DIAGNOSIS — S3992XA Unspecified injury of lower back, initial encounter: Secondary | ICD-10-CM | POA: Diagnosis not present

## 2015-03-21 DIAGNOSIS — R079 Chest pain, unspecified: Secondary | ICD-10-CM | POA: Diagnosis not present

## 2015-03-21 DIAGNOSIS — A0472 Enterocolitis due to Clostridium difficile, not specified as recurrent: Secondary | ICD-10-CM | POA: Diagnosis present

## 2015-03-21 DIAGNOSIS — K5090 Crohn's disease, unspecified, without complications: Secondary | ICD-10-CM | POA: Diagnosis present

## 2015-03-21 DIAGNOSIS — M5440 Lumbago with sciatica, unspecified side: Secondary | ICD-10-CM | POA: Diagnosis not present

## 2015-03-21 DIAGNOSIS — I951 Orthostatic hypotension: Secondary | ICD-10-CM | POA: Diagnosis present

## 2015-03-21 DIAGNOSIS — R6889 Other general symptoms and signs: Secondary | ICD-10-CM | POA: Diagnosis not present

## 2015-03-21 DIAGNOSIS — A047 Enterocolitis due to Clostridium difficile: Secondary | ICD-10-CM | POA: Insufficient documentation

## 2015-03-21 DIAGNOSIS — M5126 Other intervertebral disc displacement, lumbar region: Secondary | ICD-10-CM | POA: Diagnosis not present

## 2015-03-21 DIAGNOSIS — R5381 Other malaise: Secondary | ICD-10-CM | POA: Diagnosis not present

## 2015-03-21 DIAGNOSIS — K50919 Crohn's disease, unspecified, with unspecified complications: Secondary | ICD-10-CM | POA: Diagnosis not present

## 2015-03-21 DIAGNOSIS — D696 Thrombocytopenia, unspecified: Secondary | ICD-10-CM | POA: Diagnosis not present

## 2015-03-21 DIAGNOSIS — J45909 Unspecified asthma, uncomplicated: Secondary | ICD-10-CM | POA: Diagnosis not present

## 2015-03-21 DIAGNOSIS — R0789 Other chest pain: Secondary | ICD-10-CM | POA: Diagnosis not present

## 2015-03-21 LAB — BASIC METABOLIC PANEL
Anion gap: 13 (ref 5–15)
BUN: <5 mg/dL — ABNORMAL LOW (ref 6–20)
CO2: 28 mmol/L (ref 22–32)
Calcium: 7 mg/dL — ABNORMAL LOW (ref 8.9–10.3)
Chloride: 100 mmol/L — ABNORMAL LOW (ref 101–111)
Creatinine, Ser: 0.77 mg/dL (ref 0.44–1.00)
GFR calc Af Amer: >60 mL/min (ref >60)
GFR calc non Af Amer: >60 mL/min (ref >60)
Glucose, Bld: 93 mg/dL (ref 65–99)
Potassium: 3.7 mmol/L (ref 3.5–5.1)
Sodium: 141 mmol/L (ref 135–145)

## 2015-03-21 LAB — CBC
HCT: 34.8 % — ABNORMAL LOW (ref 36.0–46.0)
Hemoglobin: 11.3 g/dL — ABNORMAL LOW (ref 12.0–15.0)
MCH: 29.4 pg (ref 26.0–34.0)
MCHC: 32.5 g/dL (ref 30.0–36.0)
MCV: 90.6 fL (ref 78.0–100.0)
Platelets: 135 10*3/uL — ABNORMAL LOW (ref 150–400)
RBC: 3.84 MIL/uL — ABNORMAL LOW (ref 3.87–5.11)
RDW: 19.1 % — ABNORMAL HIGH (ref 11.5–15.5)
WBC: 8 10*3/uL (ref 4.0–10.5)

## 2015-03-21 LAB — GLUCOSE, CAPILLARY
GLUCOSE-CAPILLARY: 127 mg/dL — AB (ref 65–99)
Glucose-Capillary: 104 mg/dL — ABNORMAL HIGH (ref 65–99)

## 2015-03-21 LAB — CLOSTRIDIUM DIFFICILE BY PCR: Toxigenic C. Difficile by PCR: POSITIVE — AB

## 2015-03-21 LAB — I-STAT TROPONIN, ED: Troponin i, poc: 0.02 ng/mL (ref 0.00–0.08)

## 2015-03-21 LAB — LIPASE, BLOOD: Lipase: 10 U/L — ABNORMAL LOW (ref 22–51)

## 2015-03-21 LAB — POC OCCULT BLOOD, ED: Fecal Occult Bld: POSITIVE — AB

## 2015-03-21 LAB — SAVE SMEAR

## 2015-03-21 MED ORDER — ENSURE ENLIVE PO LIQD: 237.0000 mL | Freq: Two times a day (BID) | ORAL | Status: DC

## 2015-03-21 MED ORDER — ACETAMINOPHEN 325 MG PO TABS
650.0000 mg | ORAL_TABLET | Freq: Four times a day (QID) | ORAL | Status: DC | PRN
  Administered 2015-03-22 – 2015-03-24 (×3): 650 mg via ORAL
  Filled 2015-03-21 (×3): qty 2

## 2015-03-21 MED ORDER — SODIUM CHLORIDE 0.9 % IV SOLN
INTRAVENOUS | Status: DC
  Administered 2015-03-21: 16:00:00 via INTRAVENOUS

## 2015-03-21 MED ORDER — ONDANSETRON HCL 4 MG/2ML IJ SOLN
4.0000 mg | Freq: Four times a day (QID) | INTRAMUSCULAR | Status: DC | PRN
Start: 2015-03-21 — End: 2015-03-24

## 2015-03-21 MED ORDER — ASPIRIN EC 81 MG PO TBEC
81.0000 mg | DELAYED_RELEASE_TABLET | Freq: Every day | ORAL | Status: DC
  Administered 2015-03-22: 81 mg via ORAL
  Filled 2015-03-21: qty 1

## 2015-03-21 MED ORDER — HYDROCODONE-ACETAMINOPHEN 5-325 MG PO TABS
1.0000 | ORAL_TABLET | ORAL | Status: DC | PRN
  Administered 2015-03-21 – 2015-03-22 (×3): 1 via ORAL
  Filled 2015-03-21 (×3): qty 1

## 2015-03-21 MED ORDER — FENTANYL CITRATE (PF) 100 MCG/2ML IJ SOLN
50.0000 ug | Freq: Once | INTRAMUSCULAR | Status: AC
  Administered 2015-03-21: 50 ug via INTRAVENOUS
  Filled 2015-03-21: qty 2

## 2015-03-21 MED ORDER — ACETAMINOPHEN 650 MG RE SUPP: 650.0000 mg | Freq: Four times a day (QID) | RECTAL | Status: DC | PRN

## 2015-03-21 MED ORDER — ROSUVASTATIN CALCIUM 20 MG PO TABS
20.0000 mg | ORAL_TABLET | Freq: Every day | ORAL | Status: DC
  Administered 2015-03-21 – 2015-03-22 (×2): 20 mg via ORAL
  Filled 2015-03-21 (×2): qty 1

## 2015-03-21 MED ORDER — SODIUM CHLORIDE 0.9 % IJ SOLN
3.0000 mL | Freq: Two times a day (BID) | INTRAMUSCULAR | Status: DC
  Administered 2015-03-21 – 2015-03-23 (×3): 3 mL via INTRAVENOUS

## 2015-03-21 MED ORDER — FOLIC ACID 1 MG PO TABS
1.0000 mg | ORAL_TABLET | Freq: Every day | ORAL | Status: DC
  Administered 2015-03-21 – 2015-03-24 (×4): 1 mg via ORAL
  Filled 2015-03-21 (×4): qty 1

## 2015-03-21 MED ORDER — MECLIZINE HCL 25 MG PO TABS
25.0000 mg | ORAL_TABLET | Freq: Three times a day (TID) | ORAL | Status: DC | PRN
  Filled 2015-03-21: qty 1

## 2015-03-21 MED ORDER — APIXABAN 5 MG PO TABS
5.0000 mg | ORAL_TABLET | Freq: Two times a day (BID) | ORAL | Status: DC
  Administered 2015-03-21 – 2015-03-24 (×6): 5 mg via ORAL
  Filled 2015-03-21 (×7): qty 1

## 2015-03-21 MED ORDER — ALUM & MAG HYDROXIDE-SIMETH 200-200-20 MG/5ML PO SUSP: 30.0000 mL | Freq: Four times a day (QID) | ORAL | Status: DC | PRN

## 2015-03-21 MED ORDER — GABAPENTIN 800 MG PO TABS
800.0000 mg | ORAL_TABLET | Freq: Three times a day (TID) | ORAL | Status: DC
  Filled 2015-03-21: qty 1

## 2015-03-21 MED ORDER — GABAPENTIN 400 MG PO CAPS
800.0000 mg | ORAL_CAPSULE | Freq: Three times a day (TID) | ORAL | Status: DC
  Filled 2015-03-21 (×2): qty 2

## 2015-03-21 MED ORDER — ONDANSETRON HCL 4 MG PO TABS: 4.0000 mg | ORAL_TABLET | Freq: Four times a day (QID) | ORAL | Status: DC | PRN

## 2015-03-21 MED ORDER — VITAMIN B-1 100 MG PO TABS
100.0000 mg | ORAL_TABLET | Freq: Every day | ORAL | Status: DC
  Administered 2015-03-21 – 2015-03-24 (×4): 100 mg via ORAL
  Filled 2015-03-21 (×4): qty 1

## 2015-03-21 MED ORDER — TRAZODONE 25 MG HALF TABLET
25.0000 mg | ORAL_TABLET | Freq: Once | ORAL | Status: AC
  Administered 2015-03-21: 25 mg via ORAL
  Filled 2015-03-21 (×2): qty 1

## 2015-03-21 MED ORDER — PANTOPRAZOLE SODIUM 40 MG PO TBEC
40.0000 mg | DELAYED_RELEASE_TABLET | Freq: Every day | ORAL | Status: DC
  Administered 2015-03-21 – 2015-03-24 (×4): 40 mg via ORAL
  Filled 2015-03-21 (×4): qty 1

## 2015-03-21 MED ORDER — SODIUM CHLORIDE 0.9 % IV BOLUS (SEPSIS)
1000.0000 mL | Freq: Once | INTRAVENOUS | Status: AC
  Administered 2015-03-21: 1000 mL via INTRAVENOUS

## 2015-03-21 MED ORDER — ONDANSETRON 4 MG PO TBDP
4.0000 mg | ORAL_TABLET | Freq: Once | ORAL | Status: AC
  Administered 2015-03-21: 4 mg via ORAL
  Filled 2015-03-21: qty 1

## 2015-03-21 MED ORDER — VENLAFAXINE HCL 25 MG PO TABS
25.0000 mg | ORAL_TABLET | Freq: Two times a day (BID) | ORAL | Status: DC
  Administered 2015-03-21 – 2015-03-24 (×6): 25 mg via ORAL
  Filled 2015-03-21 (×7): qty 1

## 2015-03-21 MED ORDER — CALCIUM CARBONATE 1250 (500 CA) MG PO TABS
1.0000 | ORAL_TABLET | Freq: Every day | ORAL | Status: DC
  Administered 2015-03-22 – 2015-03-24 (×3): 500 mg via ORAL
  Filled 2015-03-21 (×4): qty 1

## 2015-03-21 MED ORDER — DIAZEPAM 5 MG/ML IJ SOLN
5.0000 mg | Freq: Once | INTRAMUSCULAR | Status: AC
  Administered 2015-03-21: 5 mg via INTRAVENOUS
  Filled 2015-03-21: qty 2

## 2015-03-21 MED ORDER — METRONIDAZOLE 500 MG PO TABS
500.0000 mg | ORAL_TABLET | Freq: Three times a day (TID) | ORAL | Status: DC
  Administered 2015-03-21 – 2015-03-22 (×2): 500 mg via ORAL
  Filled 2015-03-21 (×5): qty 1

## 2015-03-21 MED ORDER — GABAPENTIN 300 MG PO CAPS
600.0000 mg | ORAL_CAPSULE | Freq: Two times a day (BID) | ORAL | Status: DC
  Administered 2015-03-22 – 2015-03-24 (×5): 600 mg via ORAL
  Filled 2015-03-21 (×6): qty 2

## 2015-03-21 MED ORDER — LEVOTHYROXINE SODIUM 25 MCG PO TABS
25.0000 ug | ORAL_TABLET | Freq: Every day | ORAL | Status: DC
  Administered 2015-03-22 – 2015-03-24 (×3): 25 ug via ORAL
  Filled 2015-03-21 (×4): qty 1

## 2015-03-21 MED ORDER — ONDANSETRON HCL 4 MG PO TABS: 4.0000 mg | ORAL_TABLET | Freq: Three times a day (TID) | ORAL | Status: DC | PRN

## 2015-03-21 MED ORDER — NAPROXEN SODIUM 220 MG PO TABS: 220.0000 mg | ORAL_TABLET | ORAL | Status: DC | PRN

## 2015-03-21 MED ORDER — SODIUM CHLORIDE 0.9 % IV SOLN
INTRAVENOUS | Status: DC
  Administered 2015-03-21: 25 mL/h via INTRAVENOUS
  Administered 2015-03-22: 75 mL/h via INTRAVENOUS

## 2015-03-21 NOTE — ED Notes (Signed)
Pt brought in via [facility]. Pt seen in the ED 3 weeks ago after a fall, and was diagnosed with a broken rib on the right side. Pt reporting pain has been getting progressively worse but this morning her "back went out". Pt reporting pain radiates to back, and to her neck. Pt denies any N/V. Pt reporting pain a 10/10, and is worse with movement and palpation. EMS gave pt 324 mg of ASA in route.

## 2015-03-21 NOTE — H&P (Signed)
[facility] and Physical                                                                                    [name], is a 54 y.o. female  MRN: [id number]   DOB - Oct 27, 1960  Admit Date - 03/21/2015  Outpatient Primary MD for the patient is Shamleffer, Landry Mellow, MD  Referring Physician:    Chief Complaint:   Chief Complaint  Patient presents with  . Chest Pain     HPI  [name]  is a 54 y.o. female, with a pmh of DM, Crohn's, PE on eliquis, and known drug-seeking behavior presents to the emergency today departmentwith back pain and weakness.  The patient gives contradicting information in her history.  She states that she developed centralized back pain just above her hips that is stabbing in nature and spreads all the way across her back. She says that this pain is continuous and is associated with weakness that has become progressive over the past 2 days. This morning she is unable to get herself out of bed and walk.  The patient was just discharged from the hospital on May 30th.  After being worked up for coffee-ground emesis. She had a negative endoscopy and colonoscopy. She received a blood transfusion. After discharge the patient had a fall and subsequent chest x-ray done that showed a possible right eighth rib fracture. In the ER the patient's labs are unimpressive. She does have mild thrombocytopenia that appears to have started approximately 3 weeks ago. However, she is orthostatic to pulse with her pulse rate rising from old 100-140 on standing.  Her BP was 89/72 on admission but has responded well to IVF.    At the time of her last discharge the patient refused SNF.  Due to her weight loss and weakness I have encouraged her to reconsider.  We will check and MR of her lumbar spine.  She requests IV dilaudid for her back pain.  We will place her on PO Norco until her MRI can be completed.  Review of Systems   In addition to the HPI above,  No  Fever-chills, No Headache, No changes with Vision or hearing, No problems swallowing food or Liquids, No Chest pain, Cough or Shortness of Breath, No Abdominal pain, No Nausea or Vomiting, Bowel movements are regular, (she has chronic diarrhea) No Blood in stool or Urine, No dysuria, No new skin rashes or bruises, No new weakness, tingling, numbness in any extremity, No recent weight gain or loss, A full 10 point Review of Systems was done, except as stated above, all other Review of Systems were negative.  Past Medical History  Past Medical History  Diagnosis Date  . Glaucoma   . Diabetes mellitus without complication   . Hypertension   . Neuropathy   . Crohn disease   . Asthma   . Pulmonary embolus 05/24/2014    Past Surgical History  Procedure Laterality Date  . Cesarean section    . Esophagogastroduodenoscopy N/A 05/21/2014    Procedure: ESOPHAGOGASTRODUODENOSCOPY (EGD);  Surgeon: Petra Kuba, MD;  Location[facility] ENDOSCOPY;  Service: Endoscopy;  Laterality: N/A;  .  Esophagogastroduodenoscopy N/A 03/02/2015    Procedure: ESOPHAGOGASTRODUODENOSCOPY (EGD);  Surgeon: Bernette Redbird, MD;  Location: [facility] ENDOSCOPY;  Service: Endoscopy;  Laterality: N/A;  . Colonoscopy N/A 03/02/2015    Procedure: COLONOSCOPY;  Surgeon: Bernette Redbird, MD;  Location: [facility] ENDOSCOPY;  Service: Endoscopy;  Laterality: N/A;      Social History History  Substance Use Topics  . Smoking status: Current Every Day Smoker -- 0.25 packs/day for 26 years    Types: Cigarettes  . Smokeless tobacco: Never Used  . Alcohol Use: No  Patient refuses nicotine patch.  Lives with her son.  Has a part time aide in the morning.  Needs assistance with ADLs  Family History Family History  Problem Relation Age of Onset  . Breast cancer Mother   . Heart disease Mother 18    CABG  . Diabetes Mother   . Heart disease Father 26    CABG  . Diabetes Father     Prior to Admission medications   Medication Sig Start Date  End Date Taking? Authorizing Provider  apixaban (ELIQUIS) 5 MG TABS tablet Take 5 mg by mouth 2 (two) times daily.   Yes Historical Provider, MD  aspirin EC 81 MG tablet Take 81 mg by mouth daily.   Yes Historical Provider, MD  calcium carbonate (OS-CAL - DOSED IN MG OF ELEMENTAL CALCIUM) 1250 (500 CA) MG tablet Take 1 tablet (500 mg of elemental calcium total) by mouth daily with breakfast. 03/04/15  Yes Alison Murray, MD  feeding supplement, RESOURCE BREEZE, (RESOURCE BREEZE) LIQD Take 1 Container by mouth 3 (three) times daily between meals. 08/02/14  Yes Kathlen Mody, MD  folic acid (FOLVITE) 1 MG tablet Take 1 tablet (1 mg total) by mouth daily. 08/02/14  Yes Kathlen Mody, MD  gabapentin (NEURONTIN) 800 MG tablet Take 1 tablet (800 mg total) by mouth 3 (three) times daily. 03/04/15  Yes Alison Murray, MD  levothyroxine (SYNTHROID, LEVOTHROID) 25 MCG tablet Take 1 tablet (25 mcg total) by mouth daily before breakfast. 08/02/14  Yes Kathlen Mody, MD  meclizine (ANTIVERT) 25 MG tablet Take 25 mg by mouth 3 (three) times daily as needed for dizziness.   Yes Historical Provider, MD  naproxen sodium (ANAPROX) 220 MG tablet Take 220 mg by mouth as needed (for pain).   Yes Historical Provider, MD  ondansetron (ZOFRAN) 4 MG tablet Take 4 mg by mouth every 8 (eight) hours as needed for nausea or vomiting.   Yes Historical Provider, MD  pantoprazole (PROTONIX) 20 MG tablet Take 2 tablets (40 mg total) by mouth daily. 08/02/14  Yes Kathlen Mody, MD  rosuvastatin (CRESTOR) 20 MG tablet Take 1 tablet (20 mg total) by mouth daily. 08/02/14  Yes Kathlen Mody, MD  thiamine (VITAMIN B-1) 100 MG tablet Take 1 tablet (100 mg total) by mouth daily. 08/02/14  Yes Kathlen Mody, MD  traZODone (DESYREL) 25 mg TABS tablet Take 0.5 tablets (25 mg total) by mouth at bedtime as needed for sleep. 08/02/14  Yes Kathlen Mody, MD  venlafaxine (EFFEXOR) 25 MG tablet Take 1 tablet (25 mg total) by mouth 2 (two) times daily. 03/04/15  Yes Alison Murray, MD    Allergies  Allergen Reactions  . Iohexol Other (See Comments)    "severe burning" Patient has received Contrast in 2005 with 13 hour pre-medication, and had no reaction at that time  . Spiriva Handihaler [Tiotropium Bromide Monohydrate] Nausea And Vomiting  . Ativan [Lorazepam] Other (See Comments)  hallucinations  . Penicillins Hives    Physical Exam  Vitals  Blood pressure 104/70, pulse 97, temperature 98.2 F (36.8 C), temperature source Oral, resp. rate 17, height 5\' 4"  (1.626 m), weight 35.063 kg (77 lb 4.8 oz), last menstrual period 10/01/2010, SpO2 98 %.   General:  Cachectically thin female, lying in bed in NAD, fussing at the RN for leaving the door open.  Psych:  Normal affect and insight, Not Suicidal or Homicidal, Awake Alert, Oriented X 3.  Poor insight.  Neuro:   CN grossly in tact.  Patient not cooperative with examination of strength of extremities.  ENT:  Ears and Eyes appear Normal, Conjunctivae clear, PER. Moist oral mucosa without erythema or exudates.  Neck:  Supple, No lymphadenopathy appreciated  Respiratory:  Patient non-cooperative with exam.  Does not inspire deeply.  CTA, no accessory muscle use.  Cardiac:  RRR, No Murmurs, no LE edema noted, no JVD.    Abdomen:  Cachetically thin, Positive bowel sounds, Soft, Non tender, Non distended,  No masses appreciated  Skin:  No Cyanosis, Normal Skin Turgor, No Skin Rash or Bruise.  Extremities:  Able to move all 4. 5/5 strength in each,  no effusions.  Data Review  CBC  Recent Labs Lab 03/21/15 1053  WBC 8.0  HGB 11.3*  HCT 34.8*  PLT 135*  MCV 90.6  MCH 29.4  MCHC 32.5  RDW 19.1*    Chemistries   Recent Labs Lab 03/21/15 1053  NA 141  K 3.7  CL 100*  CO2 28  GLUCOSE 93  BUN <5*  CREATININE 0.77  CALCIUM 7.0*    Urinalysis Pending.  Imaging results:   Ct Abdomen Pelvis Wo Contrast  02/28/2015   CLINICAL DATA:  c/o fall sat night 02/26/2015 , pt is c/o  some slight neck pain but mostly c/o right rib pain Limited ROM Pt very sore to bilateral arms Hx of HTN Hx of neuropathy Hx of chrohn's  EXAM: CT CHEST, ABDOMEN AND PELVIS WITHOUT CONTRAST  This exam is limited for trauma given lack of intravenous contrast.  TECHNIQUE: Multidetector CT imaging of the chest, abdomen and pelvis was performed following the standard protocol without IV contrast.  COMPARISON:  12/28/2014  FINDINGS: CT CHEST FINDINGS  Thoracic inlet:  No masses.  No adenopathy.  Mediastinum and hila: Heart normal in size and configuration. Dense coronary artery calcifications. Great vessels normal in caliber. No mediastinal or hilar masses or evidence of adenopathy.  Lungs and pleura: 2.6 mm nodular opacity in the right upper lobe, image 9, series 3. Lungs otherwise clear. No pleural effusion. No pneumothorax.  CT ABDOMEN AND PELVIS FINDINGS  Liver: There is lesion at the dome of the liver with a increased attenuation margin, currently measuring 2.3 cm by 1.8 cm, unchanged from prior exam. No other liver abnormality.  Spleen and pancreas:  Unremarkable.  Gallbladder and biliary tree: Gallbladder unremarkable. Common bile duct is dilated to 1 cm in the pancreatic head with abrupt tapering. This is unchanged from the prior study.  Adrenal glands:  No masses.  Kidneys, ureters, bladder: Small bilateral nonobstructing intrarenal stones. No renal masses. No hydronephrosis. Ureters normal course and caliber. Bladder is mostly decompressed but otherwise unremarkable.  Uterus and adnexa:  Unremarkable.  Lymph nodes:  No adenopathy.  Ascites:  None.  GI tract: Unremarkable. No evidence of a bowel hematoma or mesenteric hematoma.  Vascular: Atherosclerotic calcifications along the aorta and its branch vessels. No aneurysm.  MUSCULOSKELETAL  No fracture.  No osteoblastic or osteolytic lesions.  IMPRESSION: 1. Study limited due to lack of contrast which particularly limits evaluation for subtle injuries to solid  viscera. 2. Allowing for the above limitation, no evidence of acute injury to the chest, abdomen or pelvis. 3. 2.6 mm nodule in the right upper lobe. This is likely scarring. If the patient is at high risk for bronchogenic carcinoma, follow-up chest CT at 1 year is recommended. If the patient is at low risk, no follow-up is needed. This recommendation follows the consensus statement: Guidelines for Management of Small Pulmonary Nodules Detected on CT Scans: A Statement from the Fleischner Society as published in Radiology 2005; 237:395-400. 4. There are chronic findings in the chest abdomen pelvis as detailed above.   Electronically Signed   By: Amie Portland M.D.   On: 02/28/2015 13:12   Dg Chest 2 View  03/21/2015   CLINICAL DATA:  Recent rib fracture, back gave out this morning when getting out of bed, back and chest pain, history hypertension, asthma, pulmonary embolism, diabetes mellitus, Crohn's disease, smoker  EXAM: CHEST  2 VIEW  COMPARISON:  03/03/2015  FINDINGS: Normal heart size, mediastinal contours, and pulmonary vascularity.  Chronic central peribronchial thickening with pulmonary hyperinflation question related to asthma.  No acute infiltrate, pleural effusion or pneumothorax.  Bones appear demineralized.  IMPRESSION: Hyperinflation with chronic central peribronchial thickening which could be related to history of asthma.  No acute infiltrate.   Electronically Signed   By: Ulyses Southward M.D.   On: 03/21/2015 13:07   Dg Ribs Unilateral W/chest Right  03/03/2015   CLINICAL DATA:  Right anterior chest pain  EXAM: RIGHT RIBS AND CHEST - 3+ VIEW  COMPARISON:  02/11/2015  FINDINGS: Cortical irregularity involving the anterior aspect of the eighth rib is identified and may represent a nondisplaced fracture. There is no evidence of pneumothorax or pleural effusion. Both lungs are clear. Heart size and mediastinal contours are within normal limits.  IMPRESSION: Suspect nondisplaced fracture involving the  anterior aspect of the right eighth rib.   Electronically Signed   By: Signa Kell M.D.   On: 03/03/2015 17:29   Ct Head Wo Contrast  02/28/2015   CLINICAL DATA:  Status post fall.  EXAM: CT HEAD WITHOUT CONTRAST  TECHNIQUE: Contiguous axial images were obtained from the base of the skull through the vertex without intravenous contrast.  COMPARISON:  12/28/2014  FINDINGS: There is prominence of the sulci and ventricles consistent with brain atrophy. Mild diffuse low attenuation throughout the subcortical and periventricular white matter is noted consistent with brain atrophy. There is no evidence for acute intracranial hemorrhage, cortical infarct or mass. No abnormal extra-axial fluid collections identified. The paranasal sinuses are clear. The mastoid air cells are also clear. Normal appearance of the calvarium.  IMPRESSION: 1. No acute intracranial abnormalities. 2. Chronic microvascular disease and brain atrophy.   Electronically Signed   By: Signa Kell M.D.   On: 02/28/2015 13:02   Ct Chest Wo Contrast  02/28/2015   CLINICAL DATA:  c/o fall sat night 02/26/2015 , pt is c/o some slight neck pain but mostly c/o right rib pain Limited ROM Pt very sore to bilateral arms Hx of HTN Hx of neuropathy Hx of chrohn's  EXAM: CT CHEST, ABDOMEN AND PELVIS WITHOUT CONTRAST  This exam is limited for trauma given lack of intravenous contrast.  TECHNIQUE: Multidetector CT imaging of the chest, abdomen and pelvis was performed following the standard protocol without IV contrast.  COMPARISON:  12/28/2014  FINDINGS: CT CHEST FINDINGS  Thoracic inlet:  No masses.  No adenopathy.  Mediastinum and hila: Heart normal in size and configuration. Dense coronary artery calcifications. Great vessels normal in caliber. No mediastinal or hilar masses or evidence of adenopathy.  Lungs and pleura: 2.6 mm nodular opacity in the right upper lobe, image 9, series 3. Lungs otherwise clear. No pleural effusion. No pneumothorax.  CT  ABDOMEN AND PELVIS FINDINGS  Liver: There is lesion at the dome of the liver with a increased attenuation margin, currently measuring 2.3 cm by 1.8 cm, unchanged from prior exam. No other liver abnormality.  Spleen and pancreas:  Unremarkable.  Gallbladder and biliary tree: Gallbladder unremarkable. Common bile duct is dilated to 1 cm in the pancreatic head with abrupt tapering. This is unchanged from the prior study.  Adrenal glands:  No masses.  Kidneys, ureters, bladder: Small bilateral nonobstructing intrarenal stones. No renal masses. No hydronephrosis. Ureters normal course and caliber. Bladder is mostly decompressed but otherwise unremarkable.  Uterus and adnexa:  Unremarkable.  Lymph nodes:  No adenopathy.  Ascites:  None.  GI tract: Unremarkable. No evidence of a bowel hematoma or mesenteric hematoma.  Vascular: Atherosclerotic calcifications along the aorta and its branch vessels. No aneurysm.  MUSCULOSKELETAL  No fracture.  No osteoblastic or osteolytic lesions.  IMPRESSION: 1. Study limited due to lack of contrast which particularly limits evaluation for subtle injuries to solid viscera. 2. Allowing for the above limitation, no evidence of acute injury to the chest, abdomen or pelvis. 3. 2.6 mm nodule in the right upper lobe. This is likely scarring. If the patient is at high risk for bronchogenic carcinoma, follow-up chest CT at 1 year is recommended. If the patient is at low risk, no follow-up is needed. This recommendation follows the consensus statement: Guidelines for Management of Small Pulmonary Nodules Detected on CT Scans: A Statement from the Fleischner Society as published in Radiology 2005; 237:395-400. 4. There are chronic findings in the chest abdomen pelvis as detailed above.   Electronically Signed   By: Amie Portland M.D.   On: 02/28/2015 13:12    My personal review of EKG: sinus rhythm, low voltage.   Assessment & Plan  Principal Problem:   Back pain Active Problems:    Weakness of back   Crohn's disease   Chronic pain   Orthostatic hypotension   Thrombocytopenia   Back pain and weakness Uncertain etiology.  Patient has a recent right rib fracture and an incidental finding of a 2.5 mm nodule in her RUL. Doubt either of these would cause this weakness.   Will check MR lumbar spine.   Will give low dose Norco.  If MR is negative would likely discontinue Norco. PT / OT ordered. Patient may need SNF.  Does not appear to be able to manage at home.  Orthostatic hypotension Likely due to poor PO intake. BP responding to fluids.  Will continue IV hydration with a second bolus and then 20 hours of IVF. Recheck orthostatics 6/21  Thrombocytopenia Platelets were normal 3 weeks ago.  Will check a smear and monitor. She does not appear to be on medications that would cause thrombocytopenia.  Hx of PE Continue Eliquis  Recent GIB  Neg EGD and Colonoscopy.  Hbg is 11 (rising).  Continue protonix.  Continue Eliquis.  2.6 mm nodule in the right upper lobe Incidental finding will need follow up CT scan in 1 year  Crohn's Disease. No current issues.  Physical deconditioning/  Severe protein-calorie malnutrition Nutrition eval was on protein supplement drink PTA  Hypothyroidism cont Synthroid   Consultants Called:  none  Family Communication:   None at bedside.  Patient is alert and orientated.  Code Status:  full  Condition:  Stable.  Potential Disposition:  Home vs SNF in 24 - 48 hrs.  Time spent in minutes : [address],  PA-C on 03/21/2015 at 5:44 PM Between 7am to 7pm - Pager - [phone] After 7pm go to [email or web address] - password [email or web address] And look for the night coverage person covering me after hours  [facility]

## 2015-03-21 NOTE — ED Notes (Signed)
Attempted to obtain orthostatic V/S on patient. Upon standing pts HR went as high as 140's.

## 2015-03-21 NOTE — ED Notes (Addendum)
Error

## 2015-03-21 NOTE — ED Provider Notes (Signed)
CSN: [id number]     Arrival date & time 03/21/15  1001 History   First MD Initiated Contact with Patient 03/21/15 1002     Chief Complaint  Patient presents with  . Chest Pain     (Consider location/radiation/quality/duration/timing/severity/associated sxs/prior Treatment) HPI  Pt is a 54yo female with hx of DM, HTN, neuropathy, asthma, and PE eight months ago, brought to ED from home via EMS with c/o "back giving out" as well as Right sided chest pain radiating into center of chest.  Pain is constant, aching and sharp, worse with movement, palpation, and deep inspiration.  Pain is 10/10 at worst.  Pt does state she normally has back pain but it is usually lower back pain, but today pt states it is her entire back.   Pt reports falling about 3 weeks ago resulting in a broken rib on her Right side.  Denies new falls.  Per pt and EMS, pt was on a "dilaudid regimen" but was discharged home w/o pain medication.  States she had been taking Aleve at home with minimal relief but states she ran on of this OTC a few days ago so she has not had any pain medication.  Reports associated mild to moderate non-productive cough, subjective fever with hot and cold chills. Denies n/v/d. Denies hx of COPD. Denies SOB but does state pain is worse with deep inspiration.  States she was not discharged home with an incentive spirometer.  Denies leg pain or swelling. Denies prior hx of CAD.  Pt is a daily smoker but has had to cut back from 1/2 pack per day to 1/8th of a pack per day due to chest pain.  Denies using oxygen at home.  Pt was given  aspirin via EMS en route. Pt states she is getting nauseated from the aspirin and normally takes zofran at home.   Pt noted to be thin, pt states she has been losing weight for over 1 year. States she has been eating as normal recently.  Per son, pt has seemed to gradually lose weight and becoming weakened. Occasionally uses walker at home for assistance. Pt suppose to be on  Eliquis from prior PE but states she has missed doses for the last 2 weeks, states she has been sleeping through the times she was suppose to take her medication and states "I just miss doses from time to time."  Per medical records from 02/28/15, pt did NOT have a rib fracture.   PCP: Dr. [facility]  Past Medical History  Diagnosis Date  . Glaucoma   . Diabetes mellitus without complication   . Hypertension   . Neuropathy   . Crohn disease   . Asthma   . Pulmonary embolus 05/24/2014   Past Surgical History  Procedure Laterality Date  . Cesarean section    . Esophagogastroduodenoscopy N/A 05/21/2014    Procedure: ESOPHAGOGASTRODUODENOSCOPY (EGD);  Surgeon: Petra Kuba, MD;  Location: [facility] ENDOSCOPY;  Service: Endoscopy;  Laterality: N/A;  . Esophagogastroduodenoscopy N/A 03/02/2015    Procedure: ESOPHAGOGASTRODUODENOSCOPY (EGD);  Surgeon: Bernette Redbird, MD;  Location: [facility] ENDOSCOPY;  Service: Endoscopy;  Laterality: N/A;  . Colonoscopy N/A 03/02/2015    Procedure: COLONOSCOPY;  Surgeon: Bernette Redbird, MD;  Location: [facility] ENDOSCOPY;  Service: Endoscopy;  Laterality: N/A;   Family History  Problem Relation Age of Onset  . Breast cancer Mother   . Heart disease Mother 39    CABG  . Diabetes Mother   . Heart disease Father  62    CABG  . Diabetes Father    History  Substance Use Topics  . Smoking status: Current Every Day Smoker -- 0.25 packs/day for 26 years    Types: Cigarettes  . Smokeless tobacco: Never Used  . Alcohol Use: No   OB History    No data available     Review of Systems  Constitutional: Positive for fever ( subjective), chills and unexpected weight change ( over 1 year, clothes fitting looser ).  HENT: Negative for congestion.   Respiratory: Positive for cough. Negative for shortness of breath, wheezing and stridor.   Cardiovascular: Positive for chest pain (Right side and center) and palpitations. Negative for leg swelling.  Gastrointestinal:  Negative for nausea, vomiting, abdominal pain and diarrhea.  Musculoskeletal: Positive for back pain. Negative for myalgias and gait problem.  All other systems reviewed and are negative.     Allergies  Iohexol; Spiriva handihaler; Ativan; and Penicillins  Home Medications   Prior to Admission medications   Medication Sig Start Date End Date Taking? Authorizing Provider  apixaban (ELIQUIS) 5 MG TABS tablet Take 5 mg by mouth 2 (two) times daily.   Yes Historical Provider, MD  aspirin EC 81 MG tablet Take 81 mg by mouth daily.   Yes Historical Provider, MD  calcium carbonate (OS-CAL - DOSED IN MG OF ELEMENTAL CALCIUM) 1250 (500 CA) MG tablet Take 1 tablet (500 mg of elemental calcium total) by mouth daily with breakfast. 03/04/15  Yes Alison Murray, MD  feeding supplement, RESOURCE BREEZE, (RESOURCE BREEZE) LIQD Take 1 Container by mouth 3 (three) times daily between meals. 08/02/14  Yes Kathlen Mody, MD  folic acid (FOLVITE) 1 MG tablet Take 1 tablet (1 mg total) by mouth daily. 08/02/14  Yes Kathlen Mody, MD  gabapentin (NEURONTIN) 800 MG tablet Take 1 tablet (800 mg total) by mouth 3 (three) times daily. 03/04/15  Yes Alison Murray, MD  levothyroxine (SYNTHROID, LEVOTHROID) 25 MCG tablet Take 1 tablet (25 mcg total) by mouth daily before breakfast. 08/02/14  Yes Kathlen Mody, MD  meclizine (ANTIVERT) 25 MG tablet Take 25 mg by mouth 3 (three) times daily as needed for dizziness.   Yes Historical Provider, MD  naproxen sodium (ANAPROX) 220 MG tablet Take 220 mg by mouth as needed (for pain).   Yes Historical Provider, MD  ondansetron (ZOFRAN) 4 MG tablet Take 4 mg by mouth every 8 (eight) hours as needed for nausea or vomiting.   Yes Historical Provider, MD  pantoprazole (PROTONIX) 20 MG tablet Take 2 tablets (40 mg total) by mouth daily. 08/02/14  Yes Kathlen Mody, MD  rosuvastatin (CRESTOR) 20 MG tablet Take 1 tablet (20 mg total) by mouth daily. 08/02/14  Yes Kathlen Mody, MD  thiamine (VITAMIN  B-1) 100 MG tablet Take 1 tablet (100 mg total) by mouth daily. 08/02/14  Yes Kathlen Mody, MD  traZODone (DESYREL) 25 mg TABS tablet Take 0.5 tablets (25 mg total) by mouth at bedtime as needed for sleep. 08/02/14  Yes Kathlen Mody, MD  venlafaxine (EFFEXOR) 25 MG tablet Take 1 tablet (25 mg total) by mouth 2 (two) times daily. 03/04/15  Yes Alison Murray, MD   BP 127/89 mmHg  Pulse 84  Temp(Src) 98 F (36.7 C) (Oral)  Resp 13  Ht 5\' 4"  (1.626 m)  Wt 77 lb 4.8 oz (35.063 kg)  BMI 13.26 kg/m2  SpO2 100%  LMP 10/01/2010 Physical Exam  Constitutional: She is oriented to person, place, and  time. She appears cachectic.  Non-toxic appearance. No distress.  HENT:  Head: Normocephalic and atraumatic.  Eyes: Conjunctivae are normal. No scleral icterus.  Neck: Normal range of motion. Neck supple.  Cardiovascular: Regular rhythm and normal heart sounds.  Tachycardia present.   Pulmonary/Chest: Effort normal. No respiratory distress. She has no wheezes. She has no rales. She exhibits tenderness.  No respiratory distress, able to speak in full sentences w/o difficulty. Lungs: decreased breath sounds in lower lung fields bilaterally due to lack of deep inspiration secondary to pain.  Tenderness to Right side and anterior chest without crepitus. No obvious deformity.   Abdominal: Soft. Bowel sounds are normal. She exhibits no distension and no mass. There is no tenderness. There is no rebound and no guarding.  Genitourinary: Rectal exam shows external hemorrhoid and tenderness. Rectal exam shows no fissure and anal tone normal. Guaiac positive stool.  Chaperoned rectal exam. External hemorrhoid 1cm in diameter. No active bleeding or discharge. Normal rectal tone. Soft brown stool on glove, no frank red blood.  Musculoskeletal: Normal range of motion. She exhibits no edema or tenderness.  Neurological: She is alert and oriented to person, place, and time. No cranial nerve deficit. Coordination normal.   Skin: Skin is warm and dry. She is not diaphoretic.  Nursing note and vitals reviewed.   ED Course  Procedures (including critical care time) Labs Review Labs Reviewed  BASIC METABOLIC PANEL - Abnormal; Notable for the following:    Chloride 100 (*)    BUN <5 (*)    Calcium 7.0 (*)    All other components within normal limits  CBC - Abnormal; Notable for the following:    RBC 3.84 (*)    Hemoglobin 11.3 (*)    HCT 34.8 (*)    RDW 19.1 (*)    Platelets 135 (*)    All other components within normal limits  POC OCCULT BLOOD, ED - Abnormal; Notable for the following:    Fecal Occult Bld POSITIVE (*)    All other components within normal limits  URINALYSIS, ROUTINE W REFLEX MICROSCOPIC (NOT AT [facility])  LIPASE, BLOOD  I-STAT TROPOININ, ED    Imaging Review Dg Chest 2 View  03/21/2015   CLINICAL DATA:  Recent rib fracture, back gave out this morning when getting out of bed, back and chest pain, history hypertension, asthma, pulmonary embolism, diabetes mellitus, Crohn's disease, smoker  EXAM: CHEST  2 VIEW  COMPARISON:  03/03/2015  FINDINGS: Normal heart size, mediastinal contours, and pulmonary vascularity.  Chronic central peribronchial thickening with pulmonary hyperinflation question related to asthma.  No acute infiltrate, pleural effusion or pneumothorax.  Bones appear demineralized.  IMPRESSION: Hyperinflation with chronic central peribronchial thickening which could be related to history of asthma.  No acute infiltrate.   Electronically Signed   By: Ulyses Southward M.D.   On: 03/21/2015 13:07     EKG Interpretation   Date/Time:  Monday March 21 2015 10:08:09 EDT Ventricular Rate:  94 PR Interval:  129 QRS Duration: 64 QT Interval:  380 QTC Calculation: 475 R Axis:   54 Text Interpretation:  Sinus rhythm Low voltage, precordial leads Since  last tracing rate slower Confirmed by Ethelda Chick  MD, SAM ([id number]) on  03/21/2015 10:19:37 AM      MDM   Final diagnoses:   Orthostatic hypotension   Pt is a 54yo cachectic female brought to ED with c/o back pain and Right sided chest wall pain. Initial report from EMS and pt was pt  had fall 3 weeks ago resulting in a Right rib fracture.  Review of medical records with CT on 5/30, imaging was negative for fracture or other acute findings. No falls since last month.  Pt is tachycardic and appears to have orthostatic hypotension, due to significant elevated in HR with standing, however, pt unable to stand long enough for BP to be measured due to significant weakness. Pt becomes tachycardic at 145 up from 100 with standing. Pt has significant difficulty standing. Only able to stand 5-10 seconds while holding onto stand to have weight obtained.    Pt given 1L IV fluids. BP is low 100s systolic, down to 89/72 at 12:52PM  Labs: hemoccult- Positive, hx of same. No frank red blood on rectal exam. Hgb 11.3, improved since 2 weeks ago as Hgb was 10.0 at that time.  EKG: rate slower since previous at 94bpm, otherwise unchanged. istat troponin: WNL CXR: No acute infiltrate.  hyperinflation with chronic central peribronchial thickening, could be related to hx of asthma.   Discussed pt with Dr. Ethelda Chick who also examined pt.  Despite missed doses of Eliquis, doubt PE given O2 Sat 99-100% on RA.  Tachycardia improved with IV fluids.  Will consult with hospitalist to admit pt for orthostatic hypotension, failure to thrive, generalized weakness.   Filed Vitals:   03/21/15 1330  BP: 125/92  Pulse: 95  Temp: 98 F at 10:09AM  Resp: 18   1:58 PM Consulted with Algis Downs, PA-C with [facility], will come evaluate pt for potential admission.     Junius Finner, PA-C 03/21/15 1622  Doug Sou, MD 03/21/15 [phone]

## 2015-03-21 NOTE — Progress Notes (Signed)
Notified MD that pt refused MRI unless she is ordered something "to make her go to sleep".  Also notified MD that pt was positive for C-Diff.  Will continue to monitor.

## 2015-03-21 NOTE — Progress Notes (Signed)
Pt still refusing NS @ 75 and insists that she will only receive IVF at 25 ml/hr.  Pt was educated on why this was medically necessary, but continued to refuse.

## 2015-03-21 NOTE — ED Provider Notes (Signed)
Presents with continuous right-sided chest pain since fall May 2016. She does complain of back pain is worse with standing and lightheadedness with standing and feels dehydrated. She admits to missing some doses of eliquis. On exam chronically ill-appearing, frail HEENT exam because membranes pale dry lungs clear auscultation heart regular rate and rhythm chest is tender anteriorly right side, reproducing pain exactly. She becomes lightheaded upon standing  Doug Sou, MD 03/21/15 1056

## 2015-03-21 NOTE — Progress Notes (Signed)
Pt stated she was on Dilaudid po at home and trazadone, and was upset that all she had ordered was norco.  Trazadone was on her home med list, but dilaudid wasn't.  Notified MD of pt request and got order for trazadone.

## 2015-03-21 NOTE — Progress Notes (Signed)
Pt  IVF set at 25 ml/hr.  Pt has order for 75 ml/hr but says" oh no, you not going to blow my fragile veins".  I explained it was just normal saline and not potassium.  She stated "set it at 25 and we'll see how I feel in 30 minutes". Pt resting with call bell within reach.  Will continue to monitor. Thomas Hoff, RN

## 2015-03-22 DIAGNOSIS — D696 Thrombocytopenia, unspecified: Secondary | ICD-10-CM | POA: Diagnosis not present

## 2015-03-22 DIAGNOSIS — M549 Dorsalgia, unspecified: Secondary | ICD-10-CM | POA: Diagnosis not present

## 2015-03-22 DIAGNOSIS — A047 Enterocolitis due to Clostridium difficile: Secondary | ICD-10-CM | POA: Diagnosis not present

## 2015-03-22 DIAGNOSIS — I951 Orthostatic hypotension: Secondary | ICD-10-CM | POA: Diagnosis not present

## 2015-03-22 LAB — MAGNESIUM
Magnesium: 1.3 mg/dL — ABNORMAL LOW (ref 1.7–2.4)
Magnesium: 3.1 mg/dL — ABNORMAL HIGH (ref 1.7–2.4)

## 2015-03-22 LAB — CBC
HCT: 28.8 % — ABNORMAL LOW (ref 36.0–46.0)
HEMOGLOBIN: 9.4 g/dL — AB (ref 12.0–15.0)
MCH: 30.1 pg (ref 26.0–34.0)
MCHC: 32.6 g/dL (ref 30.0–36.0)
MCV: 92.3 fL (ref 78.0–100.0)
PLATELETS: UNDETERMINED 10*3/uL (ref 150–400)
RBC: 3.12 MIL/uL — ABNORMAL LOW (ref 3.87–5.11)
RDW: 19.3 % — ABNORMAL HIGH (ref 11.5–15.5)
WBC: 7.5 10*3/uL (ref 4.0–10.5)

## 2015-03-22 MED ORDER — MAGNESIUM SULFATE 4 GM/100ML IV SOLN
4.0000 g | Freq: Once | INTRAVENOUS | Status: AC
  Administered 2015-03-22: 4 g via INTRAVENOUS
  Filled 2015-03-22: qty 100

## 2015-03-22 MED ORDER — VANCOMYCIN 50 MG/ML ORAL SOLUTION
125.0000 mg | Freq: Four times a day (QID) | ORAL | Status: DC
  Administered 2015-03-22 – 2015-03-24 (×9): 125 mg via ORAL
  Filled 2015-03-22 (×12): qty 2.5

## 2015-03-22 MED ORDER — ROSUVASTATIN CALCIUM 20 MG PO TABS
20.0000 mg | ORAL_TABLET | Freq: Every day | ORAL | Status: DC
  Administered 2015-03-23: 20 mg via ORAL
  Filled 2015-03-22 (×2): qty 1

## 2015-03-22 MED ORDER — BOOST / RESOURCE BREEZE PO LIQD
1.0000 | Freq: Three times a day (TID) | ORAL | Status: DC
  Administered 2015-03-22 – 2015-03-23 (×5): 1 via ORAL
  Administered 2015-03-23: 23:00:00 via ORAL

## 2015-03-22 MED ORDER — DICLOFENAC SODIUM 1 % TD GEL
4.0000 g | Freq: Four times a day (QID) | TRANSDERMAL | Status: DC
  Administered 2015-03-22 – 2015-03-23 (×7): 4 g via TOPICAL
  Filled 2015-03-22: qty 100

## 2015-03-22 MED ORDER — TRAMADOL HCL 50 MG PO TABS
100.0000 mg | ORAL_TABLET | Freq: Four times a day (QID) | ORAL | Status: DC
  Administered 2015-03-22 – 2015-03-24 (×9): 100 mg via ORAL
  Filled 2015-03-22 (×9): qty 2

## 2015-03-22 NOTE — Progress Notes (Signed)
NP Schorr paged regarding pt mag level 1.3.  New orders received.  Will continue to monitor pt closely.  Erenest Rasher, RN

## 2015-03-22 NOTE — Evaluation (Signed)
Physical Therapy Evaluation Patient Details Name: [name] MRN: [id number] DOB: 03/12/1961 Today's Date: 03/22/2015   History of Present Illness  Pt is a 54 y/o female with a PMH of DM, Crohn's, PE, known drug-seeking behavior who presents to the ED with back pain and weakness. Recent admission and d/c on 5/30 after work-up for coffee-ground emesis. After d/c pt had a fall with subsequent 8th rib fx.   Clinical Impression  Pt admitted with above diagnosis. Pt currently with functional limitations due to the deficits listed below (see PT Problem List). At the time of PT eval pt was able to perform transfers safely with +2 assist. Pt with obvious difficulties in standing to gain and maintain balance, and demonstrated a significant posterior lean. Unclear if the patient was giving her full effort during standing tasks, as there was a notable difference in amount of assistance necessary when +2 assist was sought out.   Pt will benefit from skilled PT to increase their independence and safety with mobility to allow discharge to the venue listed below. Please note that official recommendation is for continued therapy at the SNF level as pt is alone during the day and it does not appear that pt will be able to manage mobility and self-care at this time. Pt adamantly refusing SNF, and therefore will need HHPT services at d/c and 24 hour assistance.      Follow Up Recommendations SNF;Supervision/Assistance - 24 hour    Equipment Recommendations  None recommended by PT    Recommendations for Other Services       Precautions / Restrictions Precautions Precautions: Fall Precaution Comments: 8th rib fracture on the right Restrictions Weight Bearing Restrictions: No      Mobility  Bed Mobility Overal bed mobility: Needs Assistance Bed Mobility: Supine to Sit     Supine to sit: Min assist;HOB elevated     General bed mobility comments: Heavy reliance on bed rail. Pt moving slow and only  using her LUE. Increased time to scoot hips out so feet were resting on the floor.   Transfers Overall transfer level: Needs assistance Equipment used: Rolling walker (2 wheeled);2 person hand held assist Transfers: Sit to/from [employer] Sit to Stand: Max assist;Min assist;+2 physical assistance Stand pivot transfers: Min assist;+2 physical assistance       General transfer comment: First attempted sit>stand with RW, and pt required max assist for balance and support. Pt required cueing for proper hand placement on seated surface. Pt was pushing feet forward with knees hyperextended and significant posterior lean. Pt refusing to put her RUE on the walker and was unable to gain balance. Prior to return to sitting knees appeared to be buckling.Pt then was assisted to the recliner chair via SPT with second person for support. Pt required significantly less assistance this attempt and took smooth pivotal steps to the recliner.  Ambulation/Gait             General Gait Details: Unable at this time.   Stairs            Wheelchair Mobility    Modified Rankin (Stroke Patients Only)       Balance Overall balance assessment: Needs assistance Sitting-balance support: Feet supported Sitting balance-Leahy Scale: Fair Sitting balance - Comments: holding bed rail mostly in sitting, able to sit unsupported, but demonstrates poor postural strength   Standing balance support: Bilateral upper extremity supported;During functional activity Standing balance-Leahy Scale: Poor  Pertinent Vitals/Pain Pain Assessment: 0-10 Pain Score: 10-Worst pain ever Faces Pain Scale: Hurts a little bit Pain Location: R side of chest and across back, down her R flank.  Pain Descriptors / Indicators: Aching Pain Intervention(s): Limited activity within patient's tolerance;Monitored during session;Repositioned    Home Living Family/patient  expects to be discharged to:: Private residence Living Arrangements: Children Available Help at Discharge: Personal care attendant;Available PRN/intermittently;Family Type of Home: Apartment Home Access: Stairs to enter Entrance Stairs-Rails: Doctor, general practice of Steps: 6 steps with L rail to landing, 6 steps with R rail to apartment Home Layout: One level Home Equipment: Environmental consultant - 2 wheels;Cane - single point;Bedside commode;Hospital bed;Tub bench Additional Comments: Son not employed however not always at home so patient is home alone.  She reports she has an aide 2-3 hours/day up to 7 days a week.  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  She states she has been told she has been approved for CAPs, but has not yet received her confirmation letter.      Prior Function Level of Independence: Needs assistance   Gait / Transfers Assistance Needed: Pt reports she ambulated short distances with SPC.  She reports that the RW does not move well on her carpet   ADL's / Homemaking Assistance Needed: Pt reports her aid performs most to all of her ADLs and son assists with IADLs         Hand Dominance   Dominant Hand: Left    Extremity/Trunk Assessment   Upper Extremity Assessment: Defer to OT evaluation           Lower Extremity Assessment: Generalized weakness      Cervical / Trunk Assessment: Kyphotic  Communication   Communication: No difficulties  Cognition Arousal/Alertness: Awake/alert Behavior During Therapy: WFL for tasks assessed/performed Overall Cognitive Status: No family/caregiver present to determine baseline cognitive functioning                      General Comments      Exercises        Assessment/Plan    PT Assessment Patient needs continued PT services  PT Diagnosis Generalized weakness;Acute pain;Difficulty walking   PT Problem List Decreased strength;Decreased range of motion;Decreased activity  tolerance;Decreased balance;Decreased mobility;Decreased knowledge of use of DME;Decreased safety awareness;Decreased knowledge of precautions;Pain  PT Treatment Interventions DME instruction;Therapeutic exercise;Balance training;Gait training;Functional mobility training;Therapeutic activities;Patient/family education   PT Goals (Current goals can be found in the Care Plan section) Acute Rehab PT Goals Patient Stated Goal: to go home  PT Goal Formulation: With patient Time For Goal Achievement: 04/05/15 Potential to Achieve Goals: Good    Frequency Min 3X/week   Barriers to discharge Inaccessible home environment;Decreased caregiver support Pt lives in a second floor apartment and is often home alone during the day. Pt is counting on CAPS program to fill in the gaps for 24 hour assist, however I am concerned that she will not have these services available immediately after d/c.     Co-evaluation               End of Session Equipment Utilized During Treatment: Gait belt Activity Tolerance: Patient limited by pain;Patient limited by fatigue Patient left: in chair;with call bell/phone within reach Nurse Communication: Mobility status    Functional Assessment Tool Used: Clinical judgement Functional Limitation: Mobility: Walking and moving around Mobility: Walking and Moving Around Current Status (Z6109): At least 40 percent but less  than 60 percent impaired, limited or restricted Mobility: Walking and Moving Around Goal Status [phone]): At least 40 percent but less than 60 percent impaired, limited or restricted    Time: 0912-0952 PT Time Calculation (min) (ACUTE ONLY): 40 min   Charges:   PT Evaluation $Initial PT Evaluation Tier I: 1 Procedure PT Treatments $Therapeutic Activity: 23-37 mins   PT G Codes:   PT G-Codes **NOT FOR INPATIENT CLASS** Functional Assessment Tool Used: Clinical judgement Functional Limitation: Mobility: Walking and moving around Mobility:  Walking and Moving Around Current Status (X4503): At least 40 percent but less than 60 percent impaired, limited or restricted Mobility: Walking and Moving Around Goal Status [phone]): At least 40 percent but less than 60 percent impaired, limited or restricted    Conni Slipper 03/22/2015, 10:17 AM   Conni Slipper, PT, DPT Acute Rehabilitation Services Pager: [phone]

## 2015-03-22 NOTE — Progress Notes (Signed)
OT Cancellation Note  Patient Details Name: [name] MRN: [id number] DOB: [birth year]   Cancelled Treatment:    Reason Eval/Treat Not Completed: Patient at procedure or test/ unavailable. Pt attempting to have blood drawn.   Earlie Raveling OTR/L [phone] 03/22/2015, 12:32 PM

## 2015-03-22 NOTE — Progress Notes (Addendum)
Notified by CCMD pt had run of ventricular bigeminy, non-sustained.  Pt asymptomatic, BP 93/64.  PA Schorr paged and notified.   New orders received.  Will continue to monitor pt closely.  Erenest Rasher, RN

## 2015-03-22 NOTE — Progress Notes (Addendum)
[facility] Progress Note   DORETTE HARTEL ZOX:[id number] DOB: 04/27/1961 DOA: 03/21/2015 PCP: Tommy Rainwater, MD  Brief narrative: [name] is a 54 y.o. female with drug-seeking behavior, diabetes mellitus, Crohn's disease, PE on eliquis who presented to the hospital with pain and generalized weakness. Per history of present illness she admitted to centralize back pain just above her hips which was stabbing in nature and radiating across her back. She has been unable to ambulate for about 2 days prior to admission. She was admitted for further workup.   Subjective: Continues to have pain. She tells me today it is present in the right side of her chest also points to right flank and states it radiates around to her back. States that Norco is not helping and she would like something stronger. I have explained to her that we will give her tramadol and Voltaren gel only. She is noted to be incontinent of stool with stool present on her gown-no complaint of abdominal pain or nausea  Assessment/Plan: Principal Problem:   Back pain/inability to walk -Lumbar MRI negative for any acute findings-location of pain appears to have changed today or possibly the patient did not express yesterday that her pain was radiating around her right flank -however, still appears to be musculoskeletal in nature-avoid narcotics -He states she is unable to ambulate-however, she is refusing to go to SNF-recommend discharge to home once C. difficile issues are addressed  Active Problems: C. difficile colitis -Noted to be incontinent of stool when I was examining her-stool was present on her down-have started her on oral vancomycin today and will ask case management to ensure that it is affordable -She can be discharged once cost issues are addressed and once incontinence/diarrhea resolves    Crohn's disease? -This was a questionable diagnosis based on on CT findings many years ago-colonoscopy on  6/1 showed no evidence of inflammatory bowel disease and therefore I will be removing this diagnosis from her list  Mild thrombocytopenia -We will repeat today-continue eliquis unless counts are significantly dropping-thrombocytopenia may be secondary to C. difficile colitis  Recent issue with black stools -EGD in 6/1 was negative for bleeding and she was asked to resume her eliquis -Interestingly hemoglobin has actually gone up from 10 on 6/3 -11.3 when checked yesterday -Avoid aspirin and NSAIDs    Orthostatic hypotension -Continue IV fluids  History of PE -Continue eliquis   Hypomagnesemia -Replaced-recheck now with CBC    Code Status: Full code Family Communication:  Disposition Plan: Home with home health PT DVT prophylaxis: Eliquis Consultants: Procedures:  Antibiotics: Anti-infectives    Start     Dose/Rate Route Frequency Ordered Stop   03/22/15 1200  vancomycin (VANCOCIN) 50 mg/mL oral solution 125 mg     125 mg Oral 4 times per day 03/22/15 0850     03/21/15 2200  metroNIDAZOLE (FLAGYL) tablet 500 mg  Status:  Discontinued     500 mg Oral 3 times per day 03/21/15 2056 03/22/15 0850      Objective: Filed Weights   03/21/15 1129 03/22/15 0325  Weight: 35.063 kg (77 lb 4.8 oz) 38.2 kg (84 lb 3.5 oz)    Intake/Output Summary (Last 24 hours) at 03/22/15 1050 Last data filed at 03/22/15 0700  Gross per 24 hour  Intake    480 ml  Output      0 ml  Net    480 ml     Vitals Filed Vitals:   03/21/15 2038 03/22/15 0325  03/22/15 0337 03/22/15 0608  BP: 101/69 87/67 93/64  104/79  Pulse: 74 76    Temp: 97.9 F (36.6 C) 97.8 F (36.6 C)    TempSrc: Oral Oral    Resp: 18 16    Height:      Weight:  38.2 kg (84 lb 3.5 oz)    SpO2: 100% 100%      Exam:  General:  Pt is alert, not in acute distress  HEENT: No icterus, No thrush, oral mucosa moist  Cardiovascular: regular rate and rhythm, S1/S2 No murmur  Respiratory: clear to auscultation bilaterally    Abdomen: Soft, +Bowel sounds, non tender, non distended, no guarding  MSK: No LE edema, cyanosis or clubbing  Data Reviewed: Basic Metabolic Panel:  Recent Labs Lab 03/21/15 1053 03/22/15 0507  NA 141  --   K 3.7  --   CL 100*  --   CO2 28  --   GLUCOSE 93  --   BUN <5*  --   CREATININE 0.77  --   CALCIUM 7.0*  --   MG  --  1.3*   Liver Function Tests: No results for input(s): AST, ALT, ALKPHOS, BILITOT, PROT, ALBUMIN in the last 168 hours.  Recent Labs Lab 03/21/15 1053  LIPASE 10*   No results for input(s): AMMONIA in the last 168 hours. CBC:  Recent Labs Lab 03/21/15 1053  WBC 8.0  HGB 11.3*  HCT 34.8*  MCV 90.6  PLT 135*   Cardiac Enzymes: No results for input(s): CKTOTAL, CKMB, CKMBINDEX, TROPONINI in the last 168 hours. BNP (last 3 results)  Recent Labs  12/28/14 1905  BNP 77.5    ProBNP (last 3 results)  Recent Labs  07/26/14 1138  PROBNP 440.7*    CBG:  Recent Labs Lab 03/21/15 1710 03/21/15 2048  GLUCAP 127* 104*    Recent Results (from the past 240 hour(s))  Clostridium Difficile by PCR (not at [facility])     Status: Abnormal   Collection Time: 03/21/15  7:23 PM  Result Value Ref Range Status   C difficile by pcr POSITIVE (A) NEGATIVE Final    Comment: CRITICAL RESULT CALLED TO, READ BACK BY AND VERIFIED WITH: B BEAM RN 2054 03/21/15 A BROWNING      Studies: Dg Chest 2 View  03/21/2015   CLINICAL DATA:  Recent rib fracture, back gave out this morning when getting out of bed, back and chest pain, history hypertension, asthma, pulmonary embolism, diabetes mellitus, Crohn's disease, smoker  EXAM: CHEST  2 VIEW  COMPARISON:  03/03/2015  FINDINGS: Normal heart size, mediastinal contours, and pulmonary vascularity.  Chronic central peribronchial thickening with pulmonary hyperinflation question related to asthma.  No acute infiltrate, pleural effusion or pneumothorax.  Bones appear demineralized.  IMPRESSION: Hyperinflation with  chronic central peribronchial thickening which could be related to history of asthma.  No acute infiltrate.   Electronically Signed   By: Ulyses Southward M.D.   On: 03/21/2015 13:07   Mr Lumbar Spine Wo Contrast  03/21/2015   CLINICAL DATA:  Back pain and weakness progressed over 2 days. Fall 3 weeks ago.  EXAM: MRI LUMBAR SPINE WITHOUT CONTRAST  TECHNIQUE: Multiplanar, multisequence MR imaging of the lumbar spine was performed. No intravenous contrast was administered.  COMPARISON:  CT of the chest, abdomen and pelvis Feb 28, 2015  FINDINGS: Lumbar vertebral bodies and posterior elements are intact and aligned with maintenance of lumbar lordosis. Using the reference level of the last well-formed intervertebral disc as  L5-S1, intervertebral disc morphology is preserved, decreased T2 signal within the L4-5 disc, to lesser extent remaining lumbar disc consistent with mild to moderate desiccation. Very mild chronic discogenic endplate change at L4-5. No STIR signal abnormality to suggest acute osseous process.  Mild T12 superior endplate compression fracture with less than 15% height loss, present on prior CT. Conus medullaris terminates at L1 appears normal morphology and signal characteristics. Cauda equina appears normal though, limited assessment due to mild patient motion. Symmetric mildly atrophic iliopsoas muscles.  Level by level evaluation:  L1-2, L2-3, L3-4: No disc bulge. Mild facet arthropathy and ligamentum flavum redundancy L3-4 without canal stenosis or neural foraminal narrowing.  L4-5: 2 mm broad-based disc bulge with central annular fissure. Mild facet arthropathy and ligamentum flavum redundancy without canal stenosis or neural foraminal narrowing.  L5-S1: No disc bulge. Mild facet arthropathy and ligamentum flavum redundancy without canal stenosis or neural foraminal narrowing.  IMPRESSION: No acute fracture nor malalignment.  Small L4-5 broad-based disc bulge and annular fissure without canal  stenosis or neural foraminal narrowing at any lumbar level.  Symmetrically atrophic iliopsoas muscles without MR findings to denervation.   Electronically Signed   By: Awilda Metro M.D.   On: 03/21/2015 23:17    Scheduled Meds:  Scheduled Meds: . apixaban  5 mg Oral BID  . aspirin EC  81 mg Oral Daily  . calcium carbonate  1 tablet Oral Q breakfast  . diclofenac sodium  4 g Topical QID  . feeding supplement (ENSURE ENLIVE)  237 mL Oral BID BM  . folic acid  1 mg Oral Daily  . gabapentin  600 mg Oral BID  . levothyroxine  25 mcg Oral QAC breakfast  . pantoprazole  40 mg Oral Daily  . rosuvastatin  20 mg Oral Daily  . sodium chloride  3 mL Intravenous Q12H  . thiamine  100 mg Oral Daily  . traMADol  100 mg Oral 4 times per day  . vancomycin  125 mg Oral 4 times per day  . venlafaxine  25 mg Oral BID   Continuous Infusions: . sodium chloride 500 mL/hr at 03/21/15 1603  . sodium chloride 75 mL/hr at 03/22/15 0530    Time spent on care of this patient: 35 min   Keyler Hoge, MD 03/22/2015, 10:50 AM    [facility] Office  [phone] Pager - Text Page per [email or web address] If 7PM-7AM, please contact night-coverage [email or web address]

## 2015-03-22 NOTE — Progress Notes (Signed)
CSW received consult for SNF placement.  CSW informed by PT that pt is adamantly refusing SNF at this time.  Pt is also observation status and does not have a previous qualifying stay so the pt SNF stay would not be covered by insurance if the pt did decide to go to SNF.  CSW informed RNCM.  CSW signing off- please reconsult if additional CSW needs arise.  Merlyn Lot, LCSWA Clinical Social Worker [phone]

## 2015-03-22 NOTE — Progress Notes (Addendum)
Initial Nutrition Assessment  DOCUMENTATION CODES:  Severe malnutrition in context of chronic illness, Underweight  INTERVENTION:   Boost Breeze po TID, each supplement provides 250 kcal and 9 grams of protein  NUTRITION DIAGNOSIS:  Increased nutrient needs related to chronic illness as evidenced by estimated needs  GOAL:  Patient will meet greater than or equal to 90% of their needs  MONITOR:  PO intake, Supplement acceptance, Labs, Weight trends, I & O's  REASON FOR ASSESSMENT:  Consult Assessment of nutrition requirement/status  ASSESSMENT: 54 y.o. Female, with a PMH of DM, Crohn's, PE on eliquis, and known drug-seeking behavior presents to the emergency today department with back pain and weakness.  Patient known to Clinical Nutrition during previous hospital admissions.  Pt with hx of severe malnutrition which is ongoing.  Reports to this RD appetite is ok, had breakfast.    PO intake poor at 10-25% per flowsheet records.  Likes [employer] clear liquid supplements.  Typically receives 3 to 4 per day during hospital stay.  Has Ensure Enlive currently ordered -- RD to adjust.  Nutrition-Focused physical exam completed. Findings are severe fat depletion, severe muscle depletion, and no edema.   Height:  Ht Readings from Last 1 Encounters:  03/21/15  (1.626 m)    Weight:  Wt Readings from Last 1 Encounters:  03/22/15 84 lb 3.5 oz (38.2 kg)    Ideal Body Weight:  54.5 kg  Wt Readings from Last 10 Encounters:  03/22/15 84 lb 3.5 oz (38.2 kg)  03/04/15 102 lb 4.7 oz (46.4 kg)  02/02/15 76 lb (34.473 kg)  01/03/15 93 lb 14.7 oz (42.6 kg)  08/02/14 95 lb 8 oz (43.319 kg)  05/19/14 104 lb 4.8 oz (47.31 kg)  05/12/14 85 lb 15.7 oz (39 kg)  10/05/13 92 lb (41.731 kg)  10/20/12 102 lb 15.3 oz (46.7 kg)  07/30/11 94 lb 15.9 oz (43.09 kg)    BMI:  Body mass index is 14.45 kg/(m^2).  Estimated Nutritional Needs:  Kcal:  1250-1450  Protein:  65-75  gm  Fluid:  >/= 1.5 L  Skin:  Reviewed, no issues  Diet Order:  Diet regular Room service appropriate?: Yes; Fluid consistency:: Thin  EDUCATION NEEDS:  No education needs identified at this time   Intake/Output Summary (Last 24 hours) at 03/22/15 1109 Last data filed at 03/22/15 0700  Gross per 24 hour  Intake    480 ml  Output      0 ml  Net    480 ml    Last BM:  6/21  Maureen Chatters, RD, LDN Pager #: [phone] After-Hours Pager #: [phone]

## 2015-03-22 NOTE — Care Management Note (Addendum)
Case Management Note  Patient Details  Name: [name] MRN: [id number] Date of Birth: [birth year]  Subjective/Objective:  Pt admitted with back pain and weakness               Action/Plan:  Pt is from home wit son.  PT original recommendations of SNF; Pt adamantly refusing SNF, and therefore will need HHPT services at d/c and 24 hour assistance. CM will assess pt and will continue to monitor for disposition.    Expected Discharge Date:                  Expected Discharge Plan:  Home w Home Health Services  In-House Referral:  Clinical Social Work  Discharge planning Services  CM Consult  Post Acute Care Choice:    Choice offered to:   Patient  DME Arranged:    DME Agency:     HH Arranged:  PT, OT HH Agency:  [facility]  Status of Service:  In process, will continue to follow  Medicare Important Message Given:  No Date Medicare IM Given:    Medicare IM give by:    Date Additional Medicare IM Given:    Additional Medicare Important Message give by:     If discussed at Long Length of Stay Meetings, dates discussed:    Additional Comments: 03/24/15 Raynald Blend, RN, BSN [phone]  CM contacted PCP office in an attempt to make follow up appointment post discharge, CM left message with secretary.  Genevieve Norlander accepted referral for HRI.    03/23/15 Raynald Blend, RN, BSN [phone] CM was alerted that pt has had HH arranged multiple time during recent previous admits with both [facility], pt has refused to open door and cooperate with agency visits.  CM,SW and MD communicated with pt at bedside, the importance of the collaborative team including home health visits. Pt stated she was excited to began [facility] with [place] and agreed to opening door and following recommendations.  Pt stated she had a hospital bed with [facility] that was not working, CM contacted [facility] and informed, [facility] will address hospital bed maintenance.  CM requested for pt to be  placed in [facility], Physician Advisor agreed.  CM left message for Genevieve Norlander for [facility] referral.  CM will continue to monitor for disposition needs.     03/22/15 Raynald Blend, RN, BSN [phone] Pt is from home with son, pt will not have 24 hour supervision at discharge, pt refusing SNF.  Pt has aide that comes in from 9:30 am to 12:30 am M-F.  Pt is on waiting list for CAPS program.  CM assessed pt post discussion with PT.  CM verified with pt that she refused SNF, per pt; she wants to go home.  Pt stated that she wanted to use [facility] for home health services recommended by PT.  PT informed CM that HHPT and OT would benefit patient.  Pt informed CM that she has previously had [facility] providing [facility], however she did not want to continue using service, asked CM to set up [facility] with [place].  CM made [facility] aware, and contacted [facility], referral accepted for PT and OT.  CM submitted benfit check for oral Vancomycin post discharge: Pt copay will be $0- pt has medicaid & medicare. CM will request MD to write [facility] orders recommended by PT. Per pt she already has the following equipment at home; walker, cane, manual wheelchair.   CM will continue to monitor for disposition needs.  Cherylann Parr, RN 03/22/2015, 10:43 AM

## 2015-03-22 NOTE — Evaluation (Signed)
Occupational Therapy Evaluation Patient Details Name: [name] MRN: [id number] DOB: Apr 24, 1961 Today's Date: 03/22/2015    History of Present Illness Pt is a 54 y.o. female with a PMH of DM, Crohn's, PE, known drug-seeking behavior who presents to the ED with back pain and weakness. Recent admission and d/c on 5/30 after work-up for coffee-ground emesis. After d/c pt had a fall with subsequent 8th rib fx.    Clinical Impression   Pt admitted with above. Pt receiving assist for ADLs, PTA. Feel pt will benefit from acute OT to increase independence and strength prior to d/c. Recommending SNF for rehab, but pt refusing. If pt continues to refuse, recommending HHOT upon d/c.     Follow Up Recommendations  SNF;Supervision/Assistance - 24 hour    Equipment Recommendations  None recommended by OT    Recommendations for Other Services       Precautions / Restrictions Precautions Precautions: Fall Precaution Comments: 8th rib fracture on the right Restrictions Weight Bearing Restrictions: No      Mobility Bed Mobility Overal bed mobility: Needs Assistance Bed Mobility: Supine to Sit;Sit to Supine     Supine to sit: Supervision Sit to supine: Min assist   General bed mobility comments: assist with both LEs  Transfers Overall transfer level: Needs assistance   Transfers: Sit to/from Stand;Stand Pivot Transfers Sit to Stand: Mod assist;Max assist Stand pivot transfers: Mod assist       General transfer comment: More assist for sit to stand from [facility].     Balance Overall balance assessment: Needs assistance    Mod assist for stand pivot and also assist given for balance while standing.                                      ADL Overall ADL's : Needs assistance/impaired     Grooming: Wash/dry hands;Wash/dry face;Bed level;Set up               Lower Body Dressing: Moderate assistance;Sit to/from stand Lower Body Dressing Details (indicate cue  type and reason): able to don both socks Toilet Transfer: Moderate assistance;Stand-pivot;BSC   Toileting- Clothing Manipulation and Hygiene: Moderate assistance;Sit to/from stand;Sitting/lateral lean (gathered gown prior to transfer-sitting;hygiene-standing)       Functional mobility during ADLs: Moderate assistance (stand pivot; bed/BSC) General ADL Comments: Pt transferred to [facility] to have BM. Discussed d/c recommendation.      Vision     Perception     Praxis      Pertinent Vitals/Pain Pain Assessment: 0-10 Pain Score: 10-Worst pain ever Pain Location: back, right side, hips Pain Intervention(s): Monitored during session;Repositioned     Hand Dominance Left   Extremity/Trunk Assessment Upper Extremity Assessment Upper Extremity Assessment: Generalized weakness   Lower Extremity Assessment Lower Extremity Assessment: Defer to PT evaluation       Communication Communication Communication: No difficulties   Cognition Arousal/Alertness: Awake/alert Behavior During Therapy: WFL for tasks assessed/performed Overall Cognitive Status: No family/caregiver present to determine baseline cognitive functioning                     General Comments       Exercises       Shoulder Instructions      Home Living Family/patient expects to be discharged to:: Private residence Living Arrangements: Children Available Help at Discharge: Personal care attendant;Available PRN/intermittently;Family Type of Home: Apartment Home Access: Stairs  to enter Entrance Stairs-Number of Steps: 6 steps with L rail to landing, 6 steps with R rail to apartment Entrance Stairs-Rails: Right;Left Home Layout: One level     Bathroom Shower/Tub: IT trainer: Standard Bathroom Accessibility: Yes How Accessible: Accessible via wheelchair Home Equipment: Walker - 2 wheels;Cane - single point;Bedside commode;Hospital bed;Tub bench   Additional Comments: Son  not employed however not always at home so patient is home alone.  She reports she has an aide 2-3 hours/day up to 7 days a week.  Pt reports this is through a [facility] company and is not a Merchandiser, retail.  She states she has been told she has been approved for CAPs, but has not yet received her confirmation letter.        Prior Functioning/Environment Level of Independence: Needs assistance  Gait / Transfers Assistance Needed: Pt reports she ambulated short distances with SPC.  She reports that the RW does not move well on her carpet  ADL's / Homemaking Assistance Needed: per PT eval, Pt reports her aid performs most to all of her ADLs and son assists with IADLs         OT Diagnosis: Generalized weakness;Acute pain   OT Problem List: Decreased strength;Impaired balance (sitting and/or standing);Decreased safety awareness;Decreased knowledge of use of DME or AE;Decreased knowledge of precautions;Pain   OT Treatment/Interventions: Self-care/ADL training;Therapeutic exercise;DME and/or AE instruction;Therapeutic activities;Patient/family education;Balance training;Cognitive remediation/compensation    OT Goals(Current goals can be found in the care plan section) Acute Rehab OT Goals Patient Stated Goal: not stated OT Goal Formulation: With patient Time For Goal Achievement: 03/29/15 Potential to Achieve Goals: Good ADL Goals Pt Will Perform Lower Body Bathing: sit to/from stand;with caregiver independent in assisting;with min assist Pt Will Perform Lower Body Dressing: sit to/from stand;with caregiver independent in assisting;with min assist Pt Will Transfer to Toilet: with min assist;ambulating Pt Will Perform Toileting - Clothing Manipulation and hygiene: with min guard assist;with caregiver independent in assisting;sit to/from stand  OT Frequency: Min 2X/week   Barriers to D/C:            Co-evaluation              End of Session    Activity Tolerance: Patient tolerated treatment  well Patient left: in bed;with call bell/phone within reach;with bed alarm set   Time: 4540-9811 OT Time Calculation (min): 17 min Charges:  OT General Charges $OT Visit: 1 Procedure OT Evaluation $Initial OT Evaluation Tier I: 1 Procedure G-CodesEarlie Raveling OTR/L [id number] 03/22/2015, 2:46 PM

## 2015-03-23 DIAGNOSIS — I951 Orthostatic hypotension: Secondary | ICD-10-CM

## 2015-03-23 DIAGNOSIS — D696 Thrombocytopenia, unspecified: Secondary | ICD-10-CM | POA: Diagnosis not present

## 2015-03-23 DIAGNOSIS — M549 Dorsalgia, unspecified: Secondary | ICD-10-CM | POA: Diagnosis not present

## 2015-03-23 DIAGNOSIS — A047 Enterocolitis due to Clostridium difficile: Secondary | ICD-10-CM | POA: Diagnosis not present

## 2015-03-23 DIAGNOSIS — G8929 Other chronic pain: Secondary | ICD-10-CM

## 2015-03-23 DIAGNOSIS — A0472 Enterocolitis due to Clostridium difficile, not specified as recurrent: Secondary | ICD-10-CM | POA: Diagnosis present

## 2015-03-23 NOTE — Discharge Instructions (Signed)
Information on my medicine - ELIQUIS (apixaban)  This medication education was reviewed with me or my healthcare representative as part of my discharge preparation.  The pharmacist that spoke with me during my hospital stay was:  Jadarius Commons, Marius Ditch, RPH  Why was Eliquis prescribed for you? Eliquis was prescribed to treat blood clots that may have been found in the veins of your legs (deep vein thrombosis) or in your lungs (pulmonary embolism) and to reduce the risk of them occurring again.  What do You need to know about Eliquis ? The dose is ONE 5 mg tablet taken TWICE daily.  Eliquis may be taken with or without food.   Try to take the dose about the same time in the morning and in the evening. If you have difficulty swallowing the tablet whole please discuss with your pharmacist how to take the medication safely.  Take Eliquis exactly as prescribed and DO NOT stop taking Eliquis without talking to the doctor who prescribed the medication.  Stopping may increase your risk of developing a new blood clot.  Refill your prescription before you run out.  After discharge, you should have regular check-up appointments with your healthcare provider that is prescribing your Eliquis.    What do you do if you miss a dose? If a dose of ELIQUIS is not taken at the scheduled time, take it as soon as possible on the same day and twice-daily administration should be resumed. The dose should not be doubled to make up for a missed dose.  Important Safety Information A possible side effect of Eliquis is bleeding. You should call your healthcare provider right away if you experience any of the following: ? Bleeding from an injury or your nose that does not stop. ? Unusual colored urine (red or dark brown) or unusual colored stools (red or black). ? Unusual bruising for unknown reasons. ? A serious fall or if you hit your head (even if there is no bleeding).  Some medicines may interact with  Eliquis and might increase your risk of bleeding or clotting while on Eliquis. To help avoid this, consult your healthcare provider or pharmacist prior to using any new prescription or non-prescription medications, including herbals, vitamins, non-steroidal anti-inflammatory drugs (NSAIDs) and supplements.  This website has more information on Eliquis (apixaban): http://[email or web address].com/eliquis/home

## 2015-03-23 NOTE — Progress Notes (Signed)
[facility] Progress Note   [name] GSU:[id number] DOB: Apr 07, 1961 DOA: 03/21/2015 PCP: Tommy Rainwater, MD  Brief narrative: [name] is a 54 y.o. female with drug-seeking behavior, diabetes mellitus, Crohn's disease, PE on eliquis who presented to the hospital with pain and generalized weakness. Per history of present illness she admitted to centralize back pain just above her hips which was stabbing in nature and radiating across her back. She has been unable to ambulate for about 2 days prior to admission. She was admitted for further workup.   Subjective: All my evaluation patient complaining of thoracic wall chest pain, however does not appear to be in acute distress. It appeared that much of her focus in our conversation had to do with narcotics. She is asking about being prescribed dilaudid and oxycodone.  Assessment/Plan: Principal Problem:   Back pain -Lumbar MRI negative for any acute findings -Suspect may be related to arthritis. She reported ambulating to commode -Patient stating that she would be able to participate with physical therapy if given dilaudid. We'll continue pain management with gabapentin and tramadol. Physical therapy consulted.  Active Problems: C. difficile colitis -She had several episodes of liquid consistency diarrhea yesterday. Currently on vancomycin 125 mg by mouth 4 times a day -On a.m. evaluation she is nontoxic appearing. Labs remain stable, white count 7500.  Mild thrombocytopenia -We will repeat today-continue eliquis unless counts are significantly dropping-thrombocytopenia may be secondary to C. difficile colitis  Recent issue with black stools -EGD in 6/1 was negative for bleeding -She does not have evidence of GI bleed, labs show hemoglobin of 9.4 -Avoid aspirin and NSAIDs  History of PE -Continue eliquis  Hypomagnesemia -Replaced-recheck now with CBC  Drug-seeking behavior. -I am concerned with  narcotic overuse and drug-seeking behavior she has shown during this hospitalization. On our conversation this morning she seemed focused on receiving narcotic analgesiacs, specifically asking for dilated. She told me she was taking Dilaudid at home and wanted this resumes however this medication was not listed on her med rec. Pharmacy reported that she wouldn't cooperate unless given Dilantin. -I explained to patient's my concerns and recommended we continue with gabapentin and tramadol. Narcotic analgesia should not be administered at this time. On physical exam she did not appear to be in significant distress or have evidence of severe pain symptoms.  Code Status: Full code Family Communication:  Disposition Plan: Home with home health PT DVT prophylaxis: Eliquis Consultants: Procedures:  Antibiotics: Anti-infectives    Start     Dose/Rate Route Frequency Ordered Stop   03/22/15 1200  vancomycin (VANCOCIN) 50 mg/mL oral solution 125 mg     125 mg Oral 4 times per day 03/22/15 0850     03/21/15 2200  metroNIDAZOLE (FLAGYL) tablet 500 mg  Status:  Discontinued     500 mg Oral 3 times per day 03/21/15 2056 03/22/15 0850      Objective: Filed Weights   03/21/15 1129 03/22/15 0325  Weight: 35.063 kg (77 lb 4.8 oz) 38.2 kg (84 lb 3.5 oz)    Intake/Output Summary (Last 24 hours) at 03/23/15 1229 Last data filed at 03/23/15 0805  Gross per 24 hour  Intake   1095 ml  Output     75 ml  Net   1020 ml     Vitals Filed Vitals:   03/22/15 0608 03/22/15 1415 03/22/15 2128 03/23/15 0528  BP: 104/79 104/78 106/73 94/64  Pulse:  78 82 75  Temp:  98.4 F (36.9  C) 98.2 F (36.8 C) 97.9 F (36.6 C)  TempSrc:  Oral Oral Oral  Resp:  Height:      Weight:      SpO2:  100% 100% 97%    Exam:  General:  Pt is alert, not in acute distress, she is lying comfortably  HEENT: No icterus, No thrush, oral mucosa moist  Cardiovascular: regular rate and rhythm, S1/S2 No  murmur  Respiratory: clear to auscultation bilaterally   Abdomen: Soft, +Bowel sounds, non tender, non distended, no guarding  MSK: No LE edema, cyanosis or clubbing  Data Reviewed: Basic Metabolic Panel:  Recent Labs Lab 03/21/15 1053 03/22/15 0507 03/22/15 1235  NA 141  --   --   K 3.7  --   --   CL 100*  --   --   CO2 28  --   --   GLUCOSE 93  --   --   BUN <5*  --   --   CREATININE 0.77  --   --   CALCIUM 7.0*  --   --   MG  --  1.3* 3.1*   Liver Function Tests: No results for input(s): AST, ALT, ALKPHOS, BILITOT, PROT, ALBUMIN in the last 168 hours.  Recent Labs Lab 03/21/15 1053  LIPASE 10*   No results for input(s): AMMONIA in the last 168 hours. CBC:  Recent Labs Lab 03/21/15 1053 03/22/15 1235  WBC 8.0 7.5  HGB 11.3* 9.4*  HCT 34.8* 28.8*  MCV 90.6 92.3  PLT 135* PLATELET CLUMPS NOTED ON SMEAR, UNABLE TO ESTIMATE   Cardiac Enzymes: No results for input(s): CKTOTAL, CKMB, CKMBINDEX, TROPONINI in the last 168 hours. BNP (last 3 results)  Recent Labs  12/28/14 1905  BNP 77.5    ProBNP (last 3 results)  Recent Labs  07/26/14 1138  PROBNP 440.7*    CBG:  Recent Labs Lab 03/21/15 1710 03/21/15 2048  GLUCAP 127* 104*    Recent Results (from the past 240 hour(s))  Clostridium Difficile by PCR (not at [facility])     Status: Abnormal   Collection Time: 03/21/15  7:23 PM  Result Value Ref Range Status   C difficile by pcr POSITIVE (A) NEGATIVE Final    Comment: CRITICAL RESULT CALLED TO, READ BACK BY AND VERIFIED WITH: B BEAM RN 2054 03/21/15 A BROWNING      Studies: Dg Chest 2 View  03/21/2015   CLINICAL DATA:  Recent rib fracture, back gave out this morning when getting out of bed, back and chest pain, history hypertension, asthma, pulmonary embolism, diabetes mellitus, Crohn's disease, smoker  EXAM: CHEST  2 VIEW  COMPARISON:  03/03/2015  FINDINGS: Normal heart size, mediastinal contours, and pulmonary vascularity.  Chronic central  peribronchial thickening with pulmonary hyperinflation question related to asthma.  No acute infiltrate, pleural effusion or pneumothorax.  Bones appear demineralized.  IMPRESSION: Hyperinflation with chronic central peribronchial thickening which could be related to history of asthma.  No acute infiltrate.   Electronically Signed   By: Ulyses Southward M.D.   On: 03/21/2015 13:07   Mr Lumbar Spine Wo Contrast  03/21/2015   CLINICAL DATA:  Back pain and weakness progressed over 2 days. Fall 3 weeks ago.  EXAM: MRI LUMBAR SPINE WITHOUT CONTRAST  TECHNIQUE: Multiplanar, multisequence MR imaging of the lumbar spine was performed. No intravenous contrast was administered.  COMPARISON:  CT of the chest, abdomen and pelvis Feb 28, 2015  FINDINGS: Lumbar vertebral bodies and posterior  elements are intact and aligned with maintenance of lumbar lordosis. Using the reference level of the last well-formed intervertebral disc as L5-S1, intervertebral disc morphology is preserved, decreased T2 signal within the L4-5 disc, to lesser extent remaining lumbar disc consistent with mild to moderate desiccation. Very mild chronic discogenic endplate change at L4-5. No STIR signal abnormality to suggest acute osseous process.  Mild T12 superior endplate compression fracture with less than 15% height loss, present on prior CT. Conus medullaris terminates at L1 appears normal morphology and signal characteristics. Cauda equina appears normal though, limited assessment due to mild patient motion. Symmetric mildly atrophic iliopsoas muscles.  Level by level evaluation:  L1-2, L2-3, L3-4: No disc bulge. Mild facet arthropathy and ligamentum flavum redundancy L3-4 without canal stenosis or neural foraminal narrowing.  L4-5: 2 mm broad-based disc bulge with central annular fissure. Mild facet arthropathy and ligamentum flavum redundancy without canal stenosis or neural foraminal narrowing.  L5-S1: No disc bulge. Mild facet arthropathy and  ligamentum flavum redundancy without canal stenosis or neural foraminal narrowing.  IMPRESSION: No acute fracture nor malalignment.  Small L4-5 broad-based disc bulge and annular fissure without canal stenosis or neural foraminal narrowing at any lumbar level.  Symmetrically atrophic iliopsoas muscles without MR findings to denervation.   Electronically Signed   By: Awilda Metro M.D.   On: 03/21/2015 23:17    Scheduled Meds:  Scheduled Meds: . apixaban  5 mg Oral BID  . calcium carbonate  1 tablet Oral Q breakfast  . diclofenac sodium  4 g Topical QID  . feeding supplement (RESOURCE BREEZE)  1 Container Oral TID BM  . folic acid  1 mg Oral Daily  . gabapentin  600 mg Oral BID  . levothyroxine  25 mcg Oral QAC breakfast  . pantoprazole  40 mg Oral Daily  . rosuvastatin  20 mg Oral QHS  . sodium chloride  3 mL Intravenous Q12H  . thiamine  100 mg Oral Daily  . traMADol  100 mg Oral 4 times per day  . vancomycin  125 mg Oral 4 times per day  . venlafaxine  25 mg Oral BID   Continuous Infusions: . sodium chloride 500 mL/hr at 03/21/15 1603  . sodium chloride 75 mL/hr (03/22/15 1302)    Time spent on care of this patient: 35 min   Jeralyn Bennett, MD 03/23/2015, 12:29 PM    [facility] Office  [phone] Pager - Text Page per [email or web address] If 7PM-7AM, please contact night-coverage [email or web address]

## 2015-03-24 DIAGNOSIS — M6281 Muscle weakness (generalized): Secondary | ICD-10-CM | POA: Diagnosis not present

## 2015-03-24 DIAGNOSIS — M5440 Lumbago with sciatica, unspecified side: Secondary | ICD-10-CM | POA: Diagnosis not present

## 2015-03-24 DIAGNOSIS — M549 Dorsalgia, unspecified: Secondary | ICD-10-CM | POA: Diagnosis not present

## 2015-03-24 DIAGNOSIS — A047 Enterocolitis due to Clostridium difficile: Secondary | ICD-10-CM | POA: Diagnosis not present

## 2015-03-24 DIAGNOSIS — G8929 Other chronic pain: Secondary | ICD-10-CM | POA: Diagnosis not present

## 2015-03-24 LAB — BASIC METABOLIC PANEL
ANION GAP: 9 (ref 5–15)
BUN: <5 mg/dL — ABNORMAL LOW (ref 6–20)
CHLORIDE: 113 mmol/L — AB (ref 101–111)
CO2: 20 mmol/L — AB (ref 22–32)
Calcium: 7.5 mg/dL — ABNORMAL LOW (ref 8.9–10.3)
Creatinine, Ser: 0.7 mg/dL (ref 0.44–1.00)
GFR calc Af Amer: >60 mL/min (ref >60)
GFR calc non Af Amer: >60 mL/min (ref >60)
Glucose, Bld: 142 mg/dL — ABNORMAL HIGH (ref 65–99)
Potassium: 4.7 mmol/L (ref 3.5–5.1)
SODIUM: 142 mmol/L (ref 135–145)

## 2015-03-24 LAB — CBC
HCT: 30.3 % — ABNORMAL LOW (ref 36.0–46.0)
Hemoglobin: 9.4 g/dL — ABNORMAL LOW (ref 12.0–15.0)
MCH: 29.7 pg (ref 26.0–34.0)
MCHC: 31 g/dL (ref 30.0–36.0)
MCV: 95.9 fL (ref 78.0–100.0)
PLATELETS: 138 10*3/uL — AB (ref 150–400)
RBC: 3.16 MIL/uL — AB (ref 3.87–5.11)
RDW: 18.8 % — ABNORMAL HIGH (ref 11.5–15.5)
WBC: 10.2 10*3/uL (ref 4.0–10.5)

## 2015-03-24 MED ORDER — VANCOMYCIN 50 MG/ML ORAL SOLUTION: ORAL | Status: DC

## 2015-03-24 MED ORDER — GABAPENTIN 300 MG PO CAPS: 600.0000 mg | ORAL_CAPSULE | Freq: Two times a day (BID) | ORAL | Status: DC

## 2015-03-24 NOTE — Progress Notes (Signed)
Patient will discharge to home Anticipated discharge date:03/24/15 Family notified:pt to notify Transportation by [facility]AR  CSW signing off.  Merlyn Lot, LCSWA Clinical Social Worker [phone] ]

## 2015-03-24 NOTE — Discharge Summary (Signed)
Physician Discharge Summary  [name]:[id number] DOB: 11/16/1960 DOA: 03/21/2015  PCP: Tommy Rainwater, MD  Admit date: 03/21/2015 Discharge date: 03/24/2015  Time spent: 35 minutes  Recommendations for Outpatient Follow-up:  1. We had recommended SNF given her level of deconditioning and overall health care needs however she requested going home with Home Health services for home PT, RN, Aide. She has had several hospitalizations where SNF was recommended on d/c however she has refused. SW, CM and myself had an extensive conversation with her regarding our concerns for her recurrent hospitalizations and ability to care for herself at home. She remains adamant about going home.  2. Patient discharged on oral vancomycin x 12 days (Total of 14 days of PO Vanc)   Discharge Diagnoses:  Principal Problem:   Back pain Active Problems:   Chronic pain   Orthostatic hypotension   Thrombocytopenia   Weakness of back   Enteritis due to Clostridium difficile   Discharge Condition: Stable  Diet recommendation: Heart Healthy  Filed Weights   03/21/15 1129 03/22/15 0325 03/24/15 0325  Weight: 35.063 kg (77 lb 4.8 oz) 38.2 kg (84 lb 3.5 oz) 42.9 kg (94 lb 9.2 oz)    History of present illness:  [name] is a 54 y.o. female, with a pmh of DM, Crohn's, PE on eliquis, and known drug-seeking behavior presents to the emergency today departmentwith back pain and weakness. The patient gives contradicting information in her history. She states that she developed centralized back pain just above her hips that is stabbing in nature and spreads all the way across her back. She says that this pain is continuous and is associated with weakness that has become progressive over the past 2 days. This morning she is unable to get herself out of bed and walk. The patient was just discharged from the hospital on May 30th. After being worked up for coffee-ground emesis. She had a negative  endoscopy and colonoscopy. She received a blood transfusion. After discharge the patient had a fall and subsequent chest x-ray done that showed a possible right eighth rib fracture. In the ER the patient's labs are unimpressive. She does have mild thrombocytopenia that appears to have started approximately 3 weeks ago. However, she is orthostatic to pulse with her pulse rate rising from old 100-140 on standing. Her BP was 89/72 on admission but has responded well to IVF.   At the time of her last discharge the patient refused SNF. Due to her weight loss and weakness I have encouraged her to reconsider. We will check and MR of her lumbar spine. She requests IV dilaudid for her back pain. We will place her on PO Norco until her MRI can be completed.  Hospital Course:  [name] is a 54 y.o. female with drug-seeking behavior, diabetes mellitus, Crohn's disease, PE on eliquis who presented to the hospital with pain and generalized weakness. Per history of present illness she admitted to centralize back pain just above her hips which was stabbing in nature and radiating across her back. She has been unable to ambulate for about 2 days prior to admission. She was admitted for further workup.  C. difficile colitis -Dehydration may have contributed to weakness.  -She was started on vancomycin 125 mg by mouth 4 times a day  Recent issue with black stools -EGD in 6/1 was negative for bleeding -She does not have evidence of GI bleed, labs show hemoglobin of 9.4  History of PE -Remains on eliquis  Drug-seeking behavior. -I am concerned with narcotic overuse and drug-seeking behavior she has shown during this hospitalization. On our conversation this morning she seemed focused on receiving narcotic analgesiacs, specifically asking for dilated. She told me she was taking Dilaudid at home and wanted this resumes however this medication was not listed on her med rec. Pharmacy reported that she  wouldn't cooperate unless given Dilantin. -I explained to patient's my concerns and recommended we continue with gabapentin and tramadol. Narcotic analgesia should not be administered at this time. On physical exam she did not appear to be in significant distress or have evidence of severe pain symptoms.   Consultations:  PT  OT  SW  CM  Discharge Exam: Filed Vitals:   03/24/15 0329  BP: 108/80  Pulse: 125  Temp:   Resp:      General: Pt is alert, not in acute distress, she is lying comfortably  HEENT: No icterus, No thrush, oral mucosa moist  Cardiovascular: regular rate and rhythm, S1/S2 No murmur  Respiratory: clear to auscultation bilaterally   Abdomen: Soft, +Bowel sounds, non tender, non distended, no guarding  MSK: No LE edema, cyanosis or clubbing   Discharge Instructions   Discharge Instructions    Call MD for:  difficulty breathing, headache or visual disturbances    Complete by:  As directed      Call MD for:  extreme fatigue    Complete by:  As directed      Call MD for:  hives    Complete by:  As directed      Call MD for:  persistant dizziness or light-headedness    Complete by:  As directed      Call MD for:  persistant nausea and vomiting    Complete by:  As directed      Call MD for:  redness, tenderness, or signs of infection (pain, swelling, redness, odor or green/yellow discharge around incision site)    Complete by:  As directed      Call MD for:  severe uncontrolled pain    Complete by:  As directed      Call MD for:  temperature >100.4    Complete by:  As directed      Call MD for:    Complete by:  As directed      Diet - low sodium heart healthy    Complete by:  As directed      Increase activity slowly    Complete by:  As directed           Current Discharge Medication List    START taking these medications   Details  gabapentin (NEURONTIN) 300 MG capsule Take 2 capsules (600 mg total) by mouth 2 (two) times daily. Qty:  60 capsule, Refills: 1    vancomycin (VANCOCIN) 50 mg/mL oral solution 125 mg PO four times daily, qty sufficient for 12 days Qty: 5 mL, Refills: 0      CONTINUE these medications which have NOT CHANGED   Details  apixaban (ELIQUIS) 5 MG TABS tablet Take 5 mg by mouth 2 (two) times daily.    calcium carbonate (OS-CAL - DOSED IN MG OF ELEMENTAL CALCIUM) 1250 (500 CA) MG tablet Take 1 tablet (500 mg of elemental calcium total) by mouth daily with breakfast. Qty: 30 tablet, Refills: 0    feeding supplement, RESOURCE BREEZE, (RESOURCE BREEZE) LIQD Take 1 Container by mouth 3 (three) times daily between meals. Refills: 0    folic acid (  FOLVITE) 1 MG tablet Take 1 tablet (1 mg total) by mouth daily. Qty: 30 tablet, Refills: 0    levothyroxine (SYNTHROID, LEVOTHROID) 25 MCG tablet Take 1 tablet (25 mcg total) by mouth daily before breakfast. Qty: 30 tablet, Refills: 0    meclizine (ANTIVERT) 25 MG tablet Take 25 mg by mouth 3 (three) times daily as needed for dizziness.    ondansetron (ZOFRAN) 4 MG tablet Take 4 mg by mouth every 8 (eight) hours as needed for nausea or vomiting.    pantoprazole (PROTONIX) 20 MG tablet Take 2 tablets (40 mg total) by mouth daily. Qty: 30 tablet, Refills: 1    rosuvastatin (CRESTOR) 20 MG tablet Take 1 tablet (20 mg total) by mouth daily. Qty: 30 tablet, Refills: 0    thiamine (VITAMIN B-1) 100 MG tablet Take 1 tablet (100 mg total) by mouth daily. Qty: 30 tablet, Refills: 0    traZODone (DESYREL) 25 mg TABS tablet Take 0.5 tablets (25 mg total) by mouth at bedtime as needed for sleep. Qty: 30 tablet, Refills: 0    venlafaxine (EFFEXOR) 25 MG tablet Take 1 tablet (25 mg total) by mouth 2 (two) times daily. Qty: 30 tablet, Refills: 0      STOP taking these medications     aspirin EC 81 MG tablet      gabapentin (NEURONTIN) 800 MG tablet      naproxen sodium (ANAPROX) 220 MG tablet        Allergies  Allergen Reactions  . Iohexol Other (See  Comments)    "severe burning" Patient has received Contrast in 2005 with 13 hour pre-medication, and had no reaction at that time  . Spiriva Handihaler [Tiotropium Bromide Monohydrate] Nausea And Vomiting  . Ativan [Lorazepam] Other (See Comments)    hallucinations  . Penicillins Hives   Follow-up Information    Follow up with [facility].   Why:  Physical therapist and occupational therapist   Contact information:   [address] [place] [address] [phone]       Follow up with Shamleffer, Landry Mellow, MD In 1 week.   Specialty:  Internal Medicine   Contact information:   [address]  [address] [place] [address] [phone]        The results of significant diagnostics from this hospitalization (including imaging, microbiology, ancillary and laboratory) are listed below for reference.    Significant Diagnostic Studies: Ct Abdomen Pelvis Wo Contrast  02/28/2015   CLINICAL DATA:  c/o fall sat night 02/26/2015 , pt is c/o some slight neck pain but mostly c/o right rib pain Limited ROM Pt very sore to bilateral arms Hx of HTN Hx of neuropathy Hx of chrohn's  EXAM: CT CHEST, ABDOMEN AND PELVIS WITHOUT CONTRAST  This exam is limited for trauma given lack of intravenous contrast.  TECHNIQUE: Multidetector CT imaging of the chest, abdomen and pelvis was performed following the standard protocol without IV contrast.  COMPARISON:  12/28/2014  FINDINGS: CT CHEST FINDINGS  Thoracic inlet:  No masses.  No adenopathy.  Mediastinum and hila: Heart normal in size and configuration. Dense coronary artery calcifications. Great vessels normal in caliber. No mediastinal or hilar masses or evidence of adenopathy.  Lungs and pleura: 2.6 mm nodular opacity in the right upper lobe, image 9, series 3. Lungs otherwise clear. No pleural effusion. No pneumothorax.  CT ABDOMEN AND PELVIS FINDINGS  Liver: There is lesion at the dome of the liver with a increased attenuation  margin, currently measuring 2.3 cm by 1.8 cm, unchanged from prior exam. No other liver abnormality.  Spleen and pancreas:  Unremarkable.  Gallbladder and biliary tree: Gallbladder unremarkable. Common bile duct is dilated to 1 cm in the pancreatic head with abrupt tapering. This is unchanged from the prior study.  Adrenal glands:  No masses.  Kidneys, ureters, bladder: Small bilateral nonobstructing intrarenal stones. No renal masses. No hydronephrosis. Ureters normal course and caliber. Bladder is mostly decompressed but otherwise unremarkable.  Uterus and adnexa:  Unremarkable.  Lymph nodes:  No adenopathy.  Ascites:  None.  GI tract: Unremarkable. No evidence of a bowel hematoma or mesenteric hematoma.  Vascular: Atherosclerotic calcifications along the aorta and its branch vessels. No aneurysm.  MUSCULOSKELETAL  No fracture.  No osteoblastic or osteolytic lesions.  IMPRESSION: 1. Study limited due to lack of contrast which particularly limits evaluation for subtle injuries to solid viscera. 2. Allowing for the above limitation, no evidence of acute injury to the chest, abdomen or pelvis. 3. 2.6 mm nodule in the right upper lobe. This is likely scarring. If the patient is at high risk for bronchogenic carcinoma, follow-up chest CT at 1 year is recommended. If the patient is at low risk, no follow-up is needed. This recommendation follows the consensus statement: Guidelines for Management of Small Pulmonary Nodules Detected on CT Scans: A Statement from the Fleischner Society as published in Radiology 2005; 237:395-400. 4. There are chronic findings in the chest abdomen pelvis as detailed above.   Electronically Signed   By: Amie Portland M.D.   On: 02/28/2015 13:12   Dg Chest 2 View  03/21/2015   CLINICAL DATA:  Recent rib fracture, back gave out this morning when getting out of bed, back and chest pain, history hypertension, asthma, pulmonary embolism, diabetes mellitus, Crohn's disease, smoker  EXAM: CHEST   2 VIEW  COMPARISON:  03/03/2015  FINDINGS: Normal heart size, mediastinal contours, and pulmonary vascularity.  Chronic central peribronchial thickening with pulmonary hyperinflation question related to asthma.  No acute infiltrate, pleural effusion or pneumothorax.  Bones appear demineralized.  IMPRESSION: Hyperinflation with chronic central peribronchial thickening which could be related to history of asthma.  No acute infiltrate.   Electronically Signed   By: Ulyses Southward M.D.   On: 03/21/2015 13:07   Dg Ribs Unilateral W/chest Right  03/03/2015   CLINICAL DATA:  Right anterior chest pain  EXAM: RIGHT RIBS AND CHEST - 3+ VIEW  COMPARISON:  02/11/2015  FINDINGS: Cortical irregularity involving the anterior aspect of the eighth rib is identified and may represent a nondisplaced fracture. There is no evidence of pneumothorax or pleural effusion. Both lungs are clear. Heart size and mediastinal contours are within normal limits.  IMPRESSION: Suspect nondisplaced fracture involving the anterior aspect of the right eighth rib.   Electronically Signed   By: Signa Kell M.D.   On: 03/03/2015 17:29   Ct Head Wo Contrast  02/28/2015   CLINICAL DATA:  Status post fall.  EXAM: CT HEAD WITHOUT CONTRAST  TECHNIQUE: Contiguous axial images were obtained from the base of the skull through the vertex without intravenous contrast.  COMPARISON:  12/28/2014  FINDINGS: There is prominence of the sulci and ventricles consistent with brain atrophy. Mild diffuse low attenuation throughout the subcortical and periventricular white matter is noted consistent with brain atrophy. There is no evidence for acute intracranial hemorrhage, cortical infarct or mass. No abnormal extra-axial fluid collections identified. The paranasal sinuses are clear. The mastoid air cells  are also clear. Normal appearance of the calvarium.  IMPRESSION: 1. No acute intracranial abnormalities. 2. Chronic microvascular disease and brain atrophy.    Electronically Signed   By: Signa Kell M.D.   On: 02/28/2015 13:02   Ct Chest Wo Contrast  02/28/2015   CLINICAL DATA:  c/o fall sat night 02/26/2015 , pt is c/o some slight neck pain but mostly c/o right rib pain Limited ROM Pt very sore to bilateral arms Hx of HTN Hx of neuropathy Hx of chrohn's  EXAM: CT CHEST, ABDOMEN AND PELVIS WITHOUT CONTRAST  This exam is limited for trauma given lack of intravenous contrast.  TECHNIQUE: Multidetector CT imaging of the chest, abdomen and pelvis was performed following the standard protocol without IV contrast.  COMPARISON:  12/28/2014  FINDINGS: CT CHEST FINDINGS  Thoracic inlet:  No masses.  No adenopathy.  Mediastinum and hila: Heart normal in size and configuration. Dense coronary artery calcifications. Great vessels normal in caliber. No mediastinal or hilar masses or evidence of adenopathy.  Lungs and pleura: 2.6 mm nodular opacity in the right upper lobe, image 9, series 3. Lungs otherwise clear. No pleural effusion. No pneumothorax.  CT ABDOMEN AND PELVIS FINDINGS  Liver: There is lesion at the dome of the liver with a increased attenuation margin, currently measuring 2.3 cm by 1.8 cm, unchanged from prior exam. No other liver abnormality.  Spleen and pancreas:  Unremarkable.  Gallbladder and biliary tree: Gallbladder unremarkable. Common bile duct is dilated to 1 cm in the pancreatic head with abrupt tapering. This is unchanged from the prior study.  Adrenal glands:  No masses.  Kidneys, ureters, bladder: Small bilateral nonobstructing intrarenal stones. No renal masses. No hydronephrosis. Ureters normal course and caliber. Bladder is mostly decompressed but otherwise unremarkable.  Uterus and adnexa:  Unremarkable.  Lymph nodes:  No adenopathy.  Ascites:  None.  GI tract: Unremarkable. No evidence of a bowel hematoma or mesenteric hematoma.  Vascular: Atherosclerotic calcifications along the aorta and its branch vessels. No aneurysm.  MUSCULOSKELETAL  No  fracture.  No osteoblastic or osteolytic lesions.  IMPRESSION: 1. Study limited due to lack of contrast which particularly limits evaluation for subtle injuries to solid viscera. 2. Allowing for the above limitation, no evidence of acute injury to the chest, abdomen or pelvis. 3. 2.6 mm nodule in the right upper lobe. This is likely scarring. If the patient is at high risk for bronchogenic carcinoma, follow-up chest CT at 1 year is recommended. If the patient is at low risk, no follow-up is needed. This recommendation follows the consensus statement: Guidelines for Management of Small Pulmonary Nodules Detected on CT Scans: A Statement from the Fleischner Society as published in Radiology 2005; 237:395-400. 4. There are chronic findings in the chest abdomen pelvis as detailed above.   Electronically Signed   By: Amie Portland M.D.   On: 02/28/2015 13:12   Mr Lumbar Spine Wo Contrast  03/21/2015   CLINICAL DATA:  Back pain and weakness progressed over 2 days. Fall 3 weeks ago.  EXAM: MRI LUMBAR SPINE WITHOUT CONTRAST  TECHNIQUE: Multiplanar, multisequence MR imaging of the lumbar spine was performed. No intravenous contrast was administered.  COMPARISON:  CT of the chest, abdomen and pelvis Feb 28, 2015  FINDINGS: Lumbar vertebral bodies and posterior elements are intact and aligned with maintenance of lumbar lordosis. Using the reference level of the last well-formed intervertebral disc as L5-S1, intervertebral disc morphology is preserved, decreased T2 signal within the L4-5 disc, to  lesser extent remaining lumbar disc consistent with mild to moderate desiccation. Very mild chronic discogenic endplate change at L4-5. No STIR signal abnormality to suggest acute osseous process.  Mild T12 superior endplate compression fracture with less than 15% height loss, present on prior CT. Conus medullaris terminates at L1 appears normal morphology and signal characteristics. Cauda equina appears normal though, limited  assessment due to mild patient motion. Symmetric mildly atrophic iliopsoas muscles.  Level by level evaluation:  L1-2, L2-3, L3-4: No disc bulge. Mild facet arthropathy and ligamentum flavum redundancy L3-4 without canal stenosis or neural foraminal narrowing.  L4-5: 2 mm broad-based disc bulge with central annular fissure. Mild facet arthropathy and ligamentum flavum redundancy without canal stenosis or neural foraminal narrowing.  L5-S1: No disc bulge. Mild facet arthropathy and ligamentum flavum redundancy without canal stenosis or neural foraminal narrowing.  IMPRESSION: No acute fracture nor malalignment.  Small L4-5 broad-based disc bulge and annular fissure without canal stenosis or neural foraminal narrowing at any lumbar level.  Symmetrically atrophic iliopsoas muscles without MR findings to denervation.   Electronically Signed   By: Awilda Metro M.D.   On: 03/21/2015 23:17    Microbiology: Recent Results (from the past 240 hour(s))  Clostridium Difficile by PCR (not at [facility])     Status: Abnormal   Collection Time: 03/21/15  7:23 PM  Result Value Ref Range Status   C difficile by pcr POSITIVE (A) NEGATIVE Final    Comment: CRITICAL RESULT CALLED TO, READ BACK BY AND VERIFIED WITH: B BEAM RN 2054 03/21/15 A BROWNING      Labs: Basic Metabolic Panel:  Recent Labs Lab 03/21/15 1053 03/22/15 0507 03/22/15 1235 03/24/15 0428  NA 141  --   --  142  K 3.7  --   --  4.7  CL 100*  --   --  113*  CO2 28  --   --  20*  GLUCOSE 93  --   --  142*  BUN <5*  --   --  <5*  CREATININE 0.77  --   --  0.70  CALCIUM 7.0*  --   --  7.5*  MG  --  1.3* 3.1*  --    Liver Function Tests: No results for input(s): AST, ALT, ALKPHOS, BILITOT, PROT, ALBUMIN in the last 168 hours.  Recent Labs Lab 03/21/15 1053  LIPASE 10*   No results for input(s): AMMONIA in the last 168 hours. CBC:  Recent Labs Lab 03/21/15 1053 03/22/15 1235 03/24/15 0428  WBC 8.0 7.5 10.2  HGB 11.3* 9.4* 9.4*   HCT 34.8* 28.8* 30.3*  MCV 90.6 92.3 95.9  PLT 135* PLATELET CLUMPS NOTED ON SMEAR, UNABLE TO ESTIMATE 138*   Cardiac Enzymes: No results for input(s): CKTOTAL, CKMB, CKMBINDEX, TROPONINI in the last 168 hours. BNP: BNP (last 3 results)  Recent Labs  12/28/14 1905  BNP 77.5    ProBNP (last 3 results)  Recent Labs  07/26/14 1138  PROBNP 440.7*    CBG:  Recent Labs Lab 03/21/15 1710 03/21/15 2048  GLUCAP 127* 104*       Signed:  Neila Teem  [facility] 03/24/2015, 10:07 AM

## 2015-03-24 NOTE — Progress Notes (Signed)
Physical Therapy Treatment Patient Details Name: [name] MRN: [id number] DOB: [birth year] Today's Date: 03/24/2015    History of Present Illness Pt is a 54 y.o. female with a PMH of DM, Crohn's, PE, known drug-seeking behavior who presents to the ED with back pain and weakness. Recent admission and d/c on 5/30 after work-up for coffee-ground emesis. After d/c pt had a fall with subsequent 8th rib fx.     PT Comments    Pt progressing very slowly towards physical therapy goals. Appears more lethargic and confused this session, which limited participation. +2 assist was required for transfers, peri-care at [facility], and side steps to recliner chair. Continue to feel that pt is unsafe to return home at this time. Pt will be alone during the day at times and does not appear to have consistent support outside of her aide that comes in the mornings. At this time, PT recommendation for SNF remains, however pt is clear that she wishes to return home. Will continue to follow.  Follow Up Recommendations  SNF;Supervision/Assistance - 24 hour     Equipment Recommendations  None recommended by PT    Recommendations for Other Services       Precautions / Restrictions Precautions Precautions: Fall Precaution Comments: 8th rib fracture on the right Restrictions Weight Bearing Restrictions: No    Mobility  Bed Mobility Overal bed mobility: Needs Assistance Bed Mobility: Supine to Sit     Supine to sit: Supervision     General bed mobility comments: Pt was cued to sit up on the left side of the bed, and instead sat up on the right side of the bed, HOB elevated. No physical assistance required.   Transfers Overall transfer level: Needs assistance Equipment used: 2 person hand held assist Transfers: Sit to/from [employer] Sit to Stand: Mod assist Stand pivot transfers: Mod assist       General transfer comment: +1 Face-to-face SPT from bed to [facility]. Pt required specific  cueing for hand placement, sequencing, and general safety awareness during transfer. Guarding the RUE throughout.   Ambulation/Gait Ambulation/Gait assistance: Mod assist;+2 physical assistance Ambulation Distance (Feet): 2 Feet Assistive device: 2 person hand held assist Gait Pattern/deviations: Step-to pattern;Decreased stride length;Trunk flexed Gait velocity: Decreased Gait velocity interpretation: Below normal speed for age/gender General Gait Details: Pt took a few side steps to laterally transfer from [facility] to recliner chair. +2 assist for balance and support. Trunk extremely flexed and was not able to make corrective changes with cueing.    Stairs            Wheelchair Mobility    Modified Rankin (Stroke Patients Only)       Balance Overall balance assessment: Needs assistance Sitting-balance support: Feet supported;No upper extremity supported Sitting balance-Leahy Scale: Poor Sitting balance - Comments: Very flexed with difficulty extending into a neutral spine.    Standing balance support: Bilateral upper extremity supported Standing balance-Leahy Scale: Zero Standing balance comment: +2 required for dynamic                    Cognition Arousal/Alertness: Lethargic Behavior During Therapy: Flat affect Overall Cognitive Status: No family/caregiver present to determine baseline cognitive functioning       Memory: Decreased short-term memory              Exercises      General Comments        Pertinent Vitals/Pain Pain Assessment: Faces Faces Pain Scale: No hurt  Home Living                      Prior Function            PT Goals (current goals can now be found in the care plan section) Acute Rehab PT Goals Patient Stated Goal: not stated PT Goal Formulation: With patient Time For Goal Achievement: 04/05/15 Potential to Achieve Goals: Good Progress towards PT goals: Progressing toward goals    Frequency  Min  3X/week    PT Plan Current plan remains appropriate    Co-evaluation             End of Session Equipment Utilized During Treatment: Gait belt Activity Tolerance: Patient limited by lethargy;Patient limited by fatigue Patient left: in chair;with call bell/phone within reach;with nursing/sitter in room     Time: 1001-1029 PT Time Calculation (min) (ACUTE ONLY): 28 min  Charges:  $Therapeutic Activity: 23-37 mins                    G Codes:      Conni Slipper 03/25/15, 10:40 AM   Conni Slipper, PT, DPT Acute Rehabilitation Services Pager: [phone]

## 2015-03-24 NOTE — Progress Notes (Signed)
Reviewed discharge instructions with patient. IV removed. Awaiting on ambulance. Pt no c/o at this time  Hillery Hunter RN

## 2015-03-28 ENCOUNTER — Emergency Department (HOSPITAL_COMMUNITY): Payer: Medicare Other

## 2015-03-28 ENCOUNTER — Other Ambulatory Visit: Payer: Self-pay

## 2015-03-28 ENCOUNTER — Inpatient Hospital Stay (HOSPITAL_COMMUNITY)
Admission: EM | Admit: 2015-03-28 | Discharge: 2015-04-05 | DRG: 562 | Disposition: A | Discharge status: Skilled Nursing Facility | Payer: Medicare Other | Attending: Internal Medicine | Admitting: Internal Medicine

## 2015-03-28 ENCOUNTER — Encounter (HOSPITAL_COMMUNITY): Payer: Self-pay | Admitting: Emergency Medicine

## 2015-03-28 DIAGNOSIS — F329 Major depressive disorder, single episode, unspecified: Secondary | ICD-10-CM | POA: Diagnosis not present

## 2015-03-28 DIAGNOSIS — J90 Pleural effusion, not elsewhere classified: Secondary | ICD-10-CM | POA: Diagnosis not present

## 2015-03-28 DIAGNOSIS — Y92019 Unspecified place in single-family (private) house as the place of occurrence of the external cause: Secondary | ICD-10-CM

## 2015-03-28 DIAGNOSIS — Z880 Allergy status to penicillin: Secondary | ICD-10-CM

## 2015-03-28 DIAGNOSIS — Z79899 Other long term (current) drug therapy: Secondary | ICD-10-CM

## 2015-03-28 DIAGNOSIS — M85861 Other specified disorders of bone density and structure, right lower leg: Secondary | ICD-10-CM | POA: Diagnosis present

## 2015-03-28 DIAGNOSIS — S2241XA Multiple fractures of ribs, right side, initial encounter for closed fracture: Secondary | ICD-10-CM | POA: Diagnosis present

## 2015-03-28 DIAGNOSIS — Z888 Allergy status to other drugs, medicaments and biological substances status: Secondary | ICD-10-CM | POA: Diagnosis not present

## 2015-03-28 DIAGNOSIS — I10 Essential (primary) hypertension: Secondary | ICD-10-CM | POA: Diagnosis present

## 2015-03-28 DIAGNOSIS — R079 Chest pain, unspecified: Secondary | ICD-10-CM

## 2015-03-28 DIAGNOSIS — S89001A Unspecified physeal fracture of upper end of right tibia, initial encounter for closed fracture: Secondary | ICD-10-CM | POA: Diagnosis present

## 2015-03-28 DIAGNOSIS — R000 Tachycardia, unspecified: Secondary | ICD-10-CM | POA: Diagnosis not present

## 2015-03-28 DIAGNOSIS — Z91120 Patient's intentional underdosing of medication regimen due to financial hardship: Secondary | ICD-10-CM | POA: Diagnosis present

## 2015-03-28 DIAGNOSIS — Z91041 Radiographic dye allergy status: Secondary | ICD-10-CM

## 2015-03-28 DIAGNOSIS — E039 Hypothyroidism, unspecified: Secondary | ICD-10-CM | POA: Diagnosis not present

## 2015-03-28 DIAGNOSIS — T368X6A Underdosing of other systemic antibiotics, initial encounter: Secondary | ICD-10-CM | POA: Diagnosis present

## 2015-03-28 DIAGNOSIS — M87077 Idiopathic aseptic necrosis of right toe(s): Secondary | ICD-10-CM | POA: Diagnosis present

## 2015-03-28 DIAGNOSIS — S89091A Other physeal fracture of upper end of right tibia, initial encounter for closed fracture: Secondary | ICD-10-CM | POA: Diagnosis not present

## 2015-03-28 DIAGNOSIS — F17210 Nicotine dependence, cigarettes, uncomplicated: Secondary | ICD-10-CM | POA: Diagnosis present

## 2015-03-28 DIAGNOSIS — W19XXXA Unspecified fall, initial encounter: Secondary | ICD-10-CM | POA: Diagnosis present

## 2015-03-28 DIAGNOSIS — A419 Sepsis, unspecified organism: Secondary | ICD-10-CM | POA: Diagnosis present

## 2015-03-28 DIAGNOSIS — S82101D Unspecified fracture of upper end of right tibia, subsequent encounter for closed fracture with routine healing: Secondary | ICD-10-CM | POA: Diagnosis not present

## 2015-03-28 DIAGNOSIS — Z9181 History of falling: Secondary | ICD-10-CM

## 2015-03-28 DIAGNOSIS — E870 Hyperosmolality and hypernatremia: Secondary | ICD-10-CM | POA: Diagnosis present

## 2015-03-28 DIAGNOSIS — E118 Type 2 diabetes mellitus with unspecified complications: Secondary | ICD-10-CM

## 2015-03-28 DIAGNOSIS — H409 Unspecified glaucoma: Secondary | ICD-10-CM | POA: Diagnosis present

## 2015-03-28 DIAGNOSIS — N261 Atrophy of kidney (terminal): Secondary | ICD-10-CM | POA: Diagnosis not present

## 2015-03-28 DIAGNOSIS — M25562 Pain in left knee: Secondary | ICD-10-CM | POA: Diagnosis not present

## 2015-03-28 DIAGNOSIS — R69 Illness, unspecified: Secondary | ICD-10-CM | POA: Diagnosis not present

## 2015-03-28 DIAGNOSIS — S82832A Other fracture of upper and lower end of left fibula, initial encounter for closed fracture: Secondary | ICD-10-CM | POA: Diagnosis not present

## 2015-03-28 DIAGNOSIS — S82201D Unspecified fracture of shaft of right tibia, subsequent encounter for closed fracture with routine healing: Secondary | ICD-10-CM | POA: Diagnosis not present

## 2015-03-28 DIAGNOSIS — S82101A Unspecified fracture of upper end of right tibia, initial encounter for closed fracture: Secondary | ICD-10-CM | POA: Diagnosis not present

## 2015-03-28 DIAGNOSIS — W04XXXA Fall while being carried or supported by other persons, initial encounter: Secondary | ICD-10-CM | POA: Diagnosis present

## 2015-03-28 DIAGNOSIS — Z681 Body mass index (BMI) 19 or less, adult: Secondary | ICD-10-CM

## 2015-03-28 DIAGNOSIS — Z86711 Personal history of pulmonary embolism: Secondary | ICD-10-CM

## 2015-03-28 DIAGNOSIS — J45909 Unspecified asthma, uncomplicated: Secondary | ICD-10-CM | POA: Diagnosis present

## 2015-03-28 DIAGNOSIS — Z833 Family history of diabetes mellitus: Secondary | ICD-10-CM | POA: Diagnosis not present

## 2015-03-28 DIAGNOSIS — M25561 Pain in right knee: Secondary | ICD-10-CM | POA: Diagnosis not present

## 2015-03-28 DIAGNOSIS — K769 Liver disease, unspecified: Secondary | ICD-10-CM | POA: Diagnosis not present

## 2015-03-28 DIAGNOSIS — S82201A Unspecified fracture of shaft of right tibia, initial encounter for closed fracture: Secondary | ICD-10-CM

## 2015-03-28 DIAGNOSIS — R52 Pain, unspecified: Secondary | ICD-10-CM

## 2015-03-28 DIAGNOSIS — E119 Type 2 diabetes mellitus without complications: Secondary | ICD-10-CM | POA: Diagnosis not present

## 2015-03-28 DIAGNOSIS — G9340 Encephalopathy, unspecified: Secondary | ICD-10-CM | POA: Diagnosis present

## 2015-03-28 DIAGNOSIS — E876 Hypokalemia: Secondary | ICD-10-CM | POA: Diagnosis present

## 2015-03-28 DIAGNOSIS — E1165 Type 2 diabetes mellitus with hyperglycemia: Secondary | ICD-10-CM | POA: Diagnosis not present

## 2015-03-28 DIAGNOSIS — E43 Unspecified severe protein-calorie malnutrition: Secondary | ICD-10-CM | POA: Diagnosis present

## 2015-03-28 DIAGNOSIS — S82209A Unspecified fracture of shaft of unspecified tibia, initial encounter for closed fracture: Secondary | ICD-10-CM | POA: Diagnosis present

## 2015-03-28 DIAGNOSIS — R41841 Cognitive communication deficit: Secondary | ICD-10-CM | POA: Diagnosis not present

## 2015-03-28 DIAGNOSIS — S89002A Unspecified physeal fracture of upper end of left tibia, initial encounter for closed fracture: Secondary | ICD-10-CM | POA: Diagnosis present

## 2015-03-28 DIAGNOSIS — E1142 Type 2 diabetes mellitus with diabetic polyneuropathy: Secondary | ICD-10-CM | POA: Diagnosis present

## 2015-03-28 DIAGNOSIS — G629 Polyneuropathy, unspecified: Secondary | ICD-10-CM | POA: Diagnosis not present

## 2015-03-28 DIAGNOSIS — K219 Gastro-esophageal reflux disease without esophagitis: Secondary | ICD-10-CM | POA: Diagnosis present

## 2015-03-28 DIAGNOSIS — S89092A Other physeal fracture of upper end of left tibia, initial encounter for closed fracture: Secondary | ICD-10-CM | POA: Diagnosis not present

## 2015-03-28 DIAGNOSIS — K5090 Crohn's disease, unspecified, without complications: Secondary | ICD-10-CM | POA: Diagnosis present

## 2015-03-28 DIAGNOSIS — J189 Pneumonia, unspecified organism: Secondary | ICD-10-CM | POA: Diagnosis not present

## 2015-03-28 DIAGNOSIS — M7989 Other specified soft tissue disorders: Secondary | ICD-10-CM | POA: Diagnosis not present

## 2015-03-28 DIAGNOSIS — E1143 Type 2 diabetes mellitus with diabetic autonomic (poly)neuropathy: Secondary | ICD-10-CM | POA: Diagnosis not present

## 2015-03-28 DIAGNOSIS — D6959 Other secondary thrombocytopenia: Secondary | ICD-10-CM | POA: Diagnosis present

## 2015-03-28 DIAGNOSIS — Z8249 Family history of ischemic heart disease and other diseases of the circulatory system: Secondary | ICD-10-CM | POA: Diagnosis not present

## 2015-03-28 DIAGNOSIS — Z720 Tobacco use: Secondary | ICD-10-CM | POA: Diagnosis present

## 2015-03-28 DIAGNOSIS — R269 Unspecified abnormalities of gait and mobility: Secondary | ICD-10-CM | POA: Diagnosis not present

## 2015-03-28 DIAGNOSIS — S82209S Unspecified fracture of shaft of unspecified tibia, sequela: Secondary | ICD-10-CM | POA: Diagnosis not present

## 2015-03-28 DIAGNOSIS — Z7902 Long term (current) use of antithrombotics/antiplatelets: Secondary | ICD-10-CM | POA: Diagnosis not present

## 2015-03-28 DIAGNOSIS — S82102A Unspecified fracture of upper end of left tibia, initial encounter for closed fracture: Secondary | ICD-10-CM | POA: Diagnosis not present

## 2015-03-28 DIAGNOSIS — K7460 Unspecified cirrhosis of liver: Secondary | ICD-10-CM | POA: Diagnosis present

## 2015-03-28 DIAGNOSIS — A047 Enterocolitis due to Clostridium difficile: Secondary | ICD-10-CM | POA: Diagnosis present

## 2015-03-28 DIAGNOSIS — F32A Depression, unspecified: Secondary | ICD-10-CM | POA: Diagnosis present

## 2015-03-28 DIAGNOSIS — E860 Dehydration: Secondary | ICD-10-CM | POA: Diagnosis present

## 2015-03-28 DIAGNOSIS — F411 Generalized anxiety disorder: Secondary | ICD-10-CM | POA: Diagnosis not present

## 2015-03-28 DIAGNOSIS — E785 Hyperlipidemia, unspecified: Secondary | ICD-10-CM | POA: Diagnosis present

## 2015-03-28 DIAGNOSIS — F17200 Nicotine dependence, unspecified, uncomplicated: Secondary | ICD-10-CM | POA: Diagnosis not present

## 2015-03-28 DIAGNOSIS — D62 Acute posthemorrhagic anemia: Secondary | ICD-10-CM | POA: Diagnosis present

## 2015-03-28 DIAGNOSIS — T380X5A Adverse effect of glucocorticoids and synthetic analogues, initial encounter: Secondary | ICD-10-CM | POA: Diagnosis not present

## 2015-03-28 DIAGNOSIS — A0472 Enterocolitis due to Clostridium difficile, not specified as recurrent: Secondary | ICD-10-CM | POA: Diagnosis present

## 2015-03-28 DIAGNOSIS — W19XXXD Unspecified fall, subsequent encounter: Secondary | ICD-10-CM | POA: Diagnosis not present

## 2015-03-28 DIAGNOSIS — F341 Dysthymic disorder: Secondary | ICD-10-CM | POA: Diagnosis not present

## 2015-03-28 DIAGNOSIS — Y92230 Patient room in hospital as the place of occurrence of the external cause: Secondary | ICD-10-CM | POA: Diagnosis not present

## 2015-03-28 DIAGNOSIS — S82202A Unspecified fracture of shaft of left tibia, initial encounter for closed fracture: Secondary | ICD-10-CM

## 2015-03-28 DIAGNOSIS — M79609 Pain in unspecified limb: Secondary | ICD-10-CM | POA: Diagnosis not present

## 2015-03-28 DIAGNOSIS — S82191A Other fracture of upper end of right tibia, initial encounter for closed fracture: Secondary | ICD-10-CM | POA: Diagnosis not present

## 2015-03-28 DIAGNOSIS — S82192A Other fracture of upper end of left tibia, initial encounter for closed fracture: Secondary | ICD-10-CM | POA: Diagnosis not present

## 2015-03-28 DIAGNOSIS — R410 Disorientation, unspecified: Secondary | ICD-10-CM

## 2015-03-28 DIAGNOSIS — R0789 Other chest pain: Secondary | ICD-10-CM | POA: Diagnosis present

## 2015-03-28 DIAGNOSIS — K838 Other specified diseases of biliary tract: Secondary | ICD-10-CM | POA: Diagnosis not present

## 2015-03-28 DIAGNOSIS — R64 Cachexia: Secondary | ICD-10-CM | POA: Diagnosis present

## 2015-03-28 DIAGNOSIS — R29898 Other symptoms and signs involving the musculoskeletal system: Secondary | ICD-10-CM

## 2015-03-28 DIAGNOSIS — M85862 Other specified disorders of bone density and structure, left lower leg: Secondary | ICD-10-CM | POA: Diagnosis present

## 2015-03-28 DIAGNOSIS — D649 Anemia, unspecified: Secondary | ICD-10-CM

## 2015-03-28 DIAGNOSIS — E11319 Type 2 diabetes mellitus with unspecified diabetic retinopathy without macular edema: Secondary | ICD-10-CM | POA: Diagnosis not present

## 2015-03-28 DIAGNOSIS — M79606 Pain in leg, unspecified: Secondary | ICD-10-CM | POA: Diagnosis not present

## 2015-03-28 HISTORY — DX: Hyperlipidemia, unspecified: E78.5

## 2015-03-28 HISTORY — DX: Enterocolitis due to Clostridium difficile, not specified as recurrent: A04.72

## 2015-03-28 HISTORY — DX: Tobacco use: Z72.0

## 2015-03-28 HISTORY — DX: Gastro-esophageal reflux disease without esophagitis: K21.9

## 2015-03-28 HISTORY — DX: Hypothyroidism, unspecified: E03.9

## 2015-03-28 MED ORDER — SODIUM CHLORIDE 0.9 % IV BOLUS (SEPSIS)
2000.0000 mL | Freq: Once | INTRAVENOUS | Status: AC
  Administered 2015-03-28: 1000 mL via INTRAVENOUS

## 2015-03-28 MED ORDER — HEPARIN (PORCINE) IN NACL 100-0.45 UNIT/ML-% IJ SOLN
800.0000 [IU]/h | INTRAMUSCULAR | Status: DC
Start: 2015-03-28 — End: 2015-03-29
  Filled 2015-03-28: qty 250

## 2015-03-28 MED ORDER — HYDROMORPHONE HCL 1 MG/ML IJ SOLN
1.0000 mg | Freq: Once | INTRAMUSCULAR | Status: AC
  Administered 2015-03-28: 1 mg via INTRAVENOUS
  Filled 2015-03-28: qty 1

## 2015-03-28 MED ORDER — SODIUM CHLORIDE 0.9 % IV SOLN: INTRAVENOUS | Status: DC

## 2015-03-28 NOTE — ED Notes (Signed)
Pt here for increased pain from a fall she sustained on Saturday. Pt sts she was dx with sepsis Saturday and has not been taking abx. Pt is poor historian. No family at bedside. Rectal temp 100.0. Pt sts she has been sick for 1 week.

## 2015-03-28 NOTE — Progress Notes (Signed)
PATIENT ARRIVED TO 5W FROM ED VIA STRETCHER. TRANSFERRED TO BED. TELE APPLIED. VITALS OBTAINED. ASSESSMENT PERFORMED.  PATIENT INSTRUCTED TO REMAIN IN BED AND TO CALL FOR ASSISTANCE WHEN NEEDED.

## 2015-03-28 NOTE — ED Provider Notes (Signed)
CSN: [id number]     Arrival date & time 03/28/15  1924 History   First MD Initiated Contact with Patient 03/28/15 1924     Chief Complaint  Patient presents with  . Knee Pain  . Blood Infection     (Consider location/radiation/quality/duration/timing/severity/associated sxs/prior Treatment) HPI Patient states that her son who is helping her in her home, dropped her on Saturday which was 4 days ago. She reports that she was in a "pretzel position" and landed on her legs. She reports she has terrible pain in both of her knees and can't use them. She reports she also has generalized pain from her fall but mostly the pain is in her legs and knees. She had recent hospitalization with chronic back pain, extremity weakness, C. difficile colitis and falls.  Past Medical History  Diagnosis Date  . Glaucoma   . Diabetes mellitus without complication   . Hypertension   . Neuropathy   . Crohn disease   . Asthma   . Pulmonary embolus 05/24/2014  . Hypothyroidism   . GERD (gastroesophageal reflux disease)   . HLD (hyperlipidemia)   . C. difficile colitis   . Pancreatitis   . Asthma   . Tobacco abuse    Past Surgical History  Procedure Laterality Date  . Cesarean section    . Esophagogastroduodenoscopy N/A 05/21/2014    Procedure: ESOPHAGOGASTRODUODENOSCOPY (EGD);  Surgeon: Petra Kuba, MD;  Location: [facility] ENDOSCOPY;  Service: Endoscopy;  Laterality: N/A;  . Esophagogastroduodenoscopy N/A 03/02/2015    Procedure: ESOPHAGOGASTRODUODENOSCOPY (EGD);  Surgeon: Bernette Redbird, MD;  Location: [facility] ENDOSCOPY;  Service: Endoscopy;  Laterality: N/A;  . Colonoscopy N/A 03/02/2015    Procedure: COLONOSCOPY;  Surgeon: Bernette Redbird, MD;  Location: [facility] ENDOSCOPY;  Service: Endoscopy;  Laterality: N/A;   Family History  Problem Relation Age of Onset  . Breast cancer Mother   . Heart disease Mother 62    CABG  . Diabetes Mother   . Heart disease Father 62    CABG  . Diabetes Father    History  Substance  Use Topics  . Smoking status: Current Every Day Smoker -- 0.25 packs/day for 26 years    Types: Cigarettes  . Smokeless tobacco: Never Used  . Alcohol Use: No   OB History    No data available     Review of Systems  10 Systems reviewed and are negative for acute change except as noted in the HPI.   Allergies  Iohexol; Spiriva handihaler; Ativan; and Penicillins  Home Medications   Prior to Admission medications   Medication Sig Start Date End Date Taking? Authorizing Provider  apixaban (ELIQUIS) 5 MG TABS tablet Take 5 mg by mouth 2 (two) times daily.   Yes Historical Provider, MD  calcium carbonate (OS-CAL - DOSED IN MG OF ELEMENTAL CALCIUM) 1250 (500 CA) MG tablet Take 1 tablet (500 mg of elemental calcium total) by mouth daily with breakfast. 03/04/15  Yes Alison Murray, MD  feeding supplement, RESOURCE BREEZE, (RESOURCE BREEZE) LIQD Take 1 Container by mouth 3 (three) times daily between meals. 08/02/14  Yes Kathlen Mody, MD  folic acid (FOLVITE) 1 MG tablet Take 1 tablet (1 mg total) by mouth daily. 08/02/14  Yes Kathlen Mody, MD  gabapentin (NEURONTIN) 300 MG capsule Take 2 capsules (600 mg total) by mouth 2 (two) times daily. 03/24/15  Yes Jeralyn Bennett, MD  levothyroxine (SYNTHROID, LEVOTHROID) 25 MCG tablet Take 1 tablet (25 mcg total) by mouth daily before breakfast. 08/02/14  Yes Kathlen Mody, MD  meclizine (ANTIVERT) 25 MG tablet Take 25 mg by mouth 3 (three) times daily as needed for dizziness.   Yes Historical Provider, MD  ondansetron (ZOFRAN) 4 MG tablet Take 4 mg by mouth every 8 (eight) hours as needed for nausea or vomiting.   Yes Historical Provider, MD  pantoprazole (PROTONIX) 20 MG tablet Take 2 tablets (40 mg total) by mouth daily. 08/02/14  Yes Kathlen Mody, MD  rosuvastatin (CRESTOR) 20 MG tablet Take 1 tablet (20 mg total) by mouth daily. 08/02/14  Yes Kathlen Mody, MD  thiamine (VITAMIN B-1) 100 MG tablet Take 1 tablet (100 mg total) by mouth daily. 08/02/14  Yes  Kathlen Mody, MD  traZODone (DESYREL) 25 mg TABS tablet Take 0.5 tablets (25 mg total) by mouth at bedtime as needed for sleep. 08/02/14  Yes Kathlen Mody, MD  vancomycin (VANCOCIN) 50 mg/mL oral solution 125 mg PO four times daily, qty sufficient for 12 days 03/24/15  Yes Jeralyn Bennett, MD  venlafaxine (EFFEXOR) 25 MG tablet Take 1 tablet (25 mg total) by mouth 2 (two) times daily. 03/04/15  Yes Alison Murray, MD   BP 107/74 mmHg  Pulse 87  Temp(Src) 100 F (37.8 C) (Rectal)  Resp 14  Ht 5\' 4"  (1.626 m)  Wt 101 lb (45.813 kg)  BMI 17.33 kg/m2  SpO2 100%  LMP 10/01/2010 Physical Exam  Constitutional: She is oriented to person, place, and time.  Patient is a thin and deconditioned appearing female. She is nontoxic. She is alert without acute restaurant stress.   HENT:  Head: Normocephalic and atraumatic.  Right Ear: External ear normal.  Left Ear: External ear normal.  Nose: Nose normal.  Mouth/Throat: Oropharynx is clear and moist.  Eyes: EOM are normal. Pupils are equal, round, and reactive to light.  Neck: Neck supple.  Cardiovascular: Normal rate, regular rhythm, normal heart sounds and intact distal pulses.   Pulmonary/Chest: Effort normal and breath sounds normal. She exhibits tenderness.  Patient does not take deep breaths no obvious wheeze rhonchi or rail. Patient endorses pain to compression of her left lateral chest wall. Obvious contusion or abrasion.  Abdominal: Soft. Bowel sounds are normal. She exhibits no distension.  Musculoskeletal: She exhibits edema and tenderness.  Patient is using both upper extremities symmetrically. She reports severe pain with any motion of her knees. There is mild swelling of the knees. No contusions or abrasions. Large swelling of the left foot and ankle. No obvious deformity.  Neurological: She is alert and oriented to person, place, and time. No cranial nerve deficit.  Skin: Skin is warm and dry.  Psychiatric: She has a normal mood and  affect.    ED Course  Procedures (including critical care time) Labs Review Labs Reviewed  BASIC METABOLIC PANEL  CBC WITH DIFFERENTIAL/PLATELET  TYPE AND SCREEN    Imaging Review Dg Knee Complete 4 Views Left  03/28/2015   CLINICAL DATA:  Left knee pain. Patient reports being dropped by her son. Blood infection.  EXAM: LEFT KNEE - COMPLETE 4+ VIEW  COMPARISON:  None.  FINDINGS: Comminuted fracture of the proximal tibia primarily involving the metaphysis. No definite involvement of the articular surface or joint effusion/ lipohemarthrosis to suspect intra-articular extension. There is a minimally displaced proximal fibular neck fracture. 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  IMPRESSION: 1. Comminuted fracture of the proximal tibial metaphysis. No radiographic findings of intra-articular extension. 2. Mildly displaced fibular neck fracture. 3. 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   Electronically Signed   By: Rubye Oaks M.D.   On: 03/28/2015 20:47   Dg Knee Complete 4 Views Right  03/28/2015   CLINICAL DATA:  Right knee pain. Patient reports being dropped by her son. Blood infection.  EXAM: RIGHT KNEE - COMPLETE 4+ VIEW  COMPARISON:  None.  FINDINGS: There is a comminuted mildly displaced fracture of the proximal tibial metaphysis. Proximal fractional involves the posterior medial cortex, distal fracture line the anterior lateral cortex. There is no definite extension to the articular surface. The bones are diffusely under mineralized. Probable osteonecrosis of the medial and lateral femoral condyles. No joint effusion or lipohemarthrosis.  IMPRESSION: 1. Comminuted mildly displaced fracture of the proximal tibial metaphysis. No definite intra-articular extension. 2. Diffuse bony under mineralization with osteonecrosis of the femoral condyles.   Electronically Signed   By: Rubye Oaks M.D.   On: 03/28/2015 20:44   Dg Foot Complete  Left  03/28/2015   CLINICAL DATA:  Stated history of knee pain and blood infection. Patient reports being dropped by her son, lower leg swelling.  EXAM: LEFT FOOT - COMPLETE 3+ VIEW  COMPARISON:  None.  FINDINGS: Diffuse bony under mineralization. No fracture or dislocation. The alignment and joint spaces are maintained. No erosions or periosteal reaction. Question bone infarct in the calcaneus and distal tibia. Mild soft tissue edema.  IMPRESSION: Diffuse bony under mineralization and probable bone infarcts in the calcaneus and distal tibia. No acute osseous abnormality is seen.   Electronically Signed   By: Rubye Oaks M.D.   On: 03/28/2015 20:39   Dg Foot Complete Right  03/28/2015   CLINICAL DATA:  Stated history of knee pain and blood infection. Patient reports being dropped by her son, lower leg swelling.  EXAM: RIGHT FOOT COMPLETE - 3+ VIEW  COMPARISON:  No prior exams. Please note the lateral foot radiograph is associated with the right knee exam performed concurrently.  FINDINGS: The bones are under mineralized. No fracture or dislocation. The alignment and joint spaces are maintained. Probable bone infarcts involving the calcaneus and distal tibia. Avascular necrosis suspected of the second metatarsal head. No erosion or periosteal reaction. Mild diffuse soft tissue edema.  IMPRESSION: Diffuse bony under mineralization with probable bone infarcts in the distal tibia and calcaneus. 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 No definite acute bony abnormality.   Electronically Signed   By: Rubye Oaks M.D.   On: 03/28/2015 20:42     EKG Interpretation None      MDM   Final diagnoses:  Bilateral tibial fractures, closed, initial encounter  Severe comorbid illness   Patient presents with complaints of pain from a fall 4 days ago. X-rays confirm bilateral tibia fractures. Patient reports the mechanism was falling in a "pretzel" position. Patient has many comorbid  illnesses and severe disease. At this time she will be admitted to internal medicine for ongoing management of medical conditions and orthopedic consultation for bilateral tibia fractures.   Arby Barrette, MD 03/28/15 2228

## 2015-03-28 NOTE — Progress Notes (Addendum)
ANTICOAGULATION CONSULT NOTE - Initial Consult  Pharmacy Consult for Heparin Indication: h/o PE  Allergies  Allergen Reactions  . Iohexol Other (See Comments)    "severe burning" Patient has received Contrast in 2005 with 13 hour pre-medication, and had no reaction at that time  . Spiriva Handihaler [Tiotropium Bromide Monohydrate] Nausea And Vomiting  . Ativan [Lorazepam] Other (See Comments)    hallucinations  . Penicillins Hives    Patient Measurements: Height:  (162.6 cm) Weight: 101 lb (45.813 kg) IBW/kg (Calculated) : 54.7 Heparin Dosing Weight: 45.8 kg  Vital Signs: Temp: 100 F (37.8 C) (06/27 1939) Temp Source: Rectal (06/27 1939) BP: 107/74 mmHg (06/27 2129) Pulse Rate: 87 (06/27 2129)  Labs: No results for input(s): HGB, HCT, PLT, APTT, LABPROT, INR, HEPARINUNFRC, CREATININE, CKTOTAL, CKMB, TROPONINI in the last 72 hours.  Estimated Creatinine Clearance: 56.8 mL/min (by C-G formula based on Cr of 0.7).   Medical History: Past Medical History  Diagnosis Date  . Glaucoma   . Diabetes mellitus without complication   . Hypertension   . Neuropathy   . Crohn disease   . Asthma   . Pulmonary embolus 05/24/2014  . Hypothyroidism   . GERD (gastroesophageal reflux disease)   . HLD (hyperlipidemia)   . C. difficile colitis   . Pancreatitis   . Asthma   . Tobacco abuse     Medications:   (Not in a hospital admission) Scheduled:   Infusions:  . sodium chloride    . sodium chloride      Assessment: 54yo female with history of PE on apixaban presents with knee pain from fall on Saturday. Pharmacy is consulted to dose heparin for PE. Labs pending.  Pt's last apixaban dose was 6/26 at 0800.  Goal of Therapy:  Heparin level 0.3-0.7 units/ml aPTT 66-102 seconds Monitor platelets by anticoagulation protocol: Yes   Plan:  Start heparin infusion at 800 units/hr Check anti-Xa level in 6 hours and daily while on heparin Continue to monitor H&H and  platelets  Baseline aPTT/HL F/u restart apixaban  Arlean Hopping. Newman Pies, PharmD Clinical Pharmacist Pager [phone] 03/28/2015,10:31 PM  Addendum: Spoke with Dr. Clyde Lundborg.  Due to large drop in Hgb and unclear source of bleeding, will hold on heparin for now.    Geannie Risen, PharmD, BCPS 03/29/2015 12:44 AM

## 2015-03-28 NOTE — H&P (Addendum)
[facility] History and Physical  [name] ZOX:[id number] DOB: 1961/01/19 DOA: 03/28/2015  Referring physician: ED physician PCP: Tommy Rainwater, MD  Specialists:   Chief Complaint: Bilateral knee pain and foot pain after fall and chest wall pain  HPI: [name] is a 54 y.o. female with PMH of hypertension, hyperlipidemia, diet-controlled diabetes mellitus, GERD, hypothyroidism, depression, chronic dizziness, history of PE on Eliquis, pancreatitis, asthma, recently diagnosed C. difficile colitis (C. difficile PCR positive on 03/21/15), who presents with bilateral knee pain and foot pain, and chest wall pain after fall  Patient states that her son who is helping her in her home, dropped her on Saturday.  She reports that she was in a "pretzel position" and landed on her legs. She reports she has terrible pain in both of her knees and feet, and can't use them. She reports she also has pain over her frontal chest wall. She does not have shortness of breath or coughing.   Of note, patient was recently hospitalized from 6/20 to 03/24/15 because of abdominal pain and diarrhea. She had positive C. difficile PCR on 03/21/15. She was discharged on oral vancomycin for 12 days (total of 14 days of Abx), but she has not been taking this medication due to financially difficulty. She reports that her abdominal pain and diarrhea have improved. She dose not have diarrhea, but still has mild abdominal pain currently. She has mild fever, no chills. No symptoms of UTI.  In ED, patient was found to have temperature 100.0, tachycardia. X-rays confirm bilateral tibia fractures. X-ray of foot did not show bony fracture, but showed diffuse bony under mineralization with probable bone infarcts in both distal tibia and calcaneus. 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 Pending CBC and BMP. Patient is admitted to inpatient for further evaluation and treatment. Orthopedic  surgeon was consulted by ED.  Where does patient live?   At home    Can patient participate in ADLs?   Barely    Review of Systems:   General: has fevers, no chills, has poor appetite, has fatigue HEENT: no blurry vision, hearing changes or sore throat Pulm: no dyspnea, coughing, wheezing CV: has chest wall pain, no palpitations Abd: no nausea, vomiting, has abdominal pain, no diarrhea, constipation GU: no dysuria, burning on urination, increased urinary frequency, hematuria  Ext: has leg edema Neuro: no unilateral weakness, numbness, or tingling, no vision change or hearing loss Skin: no rash MSK: has swelling and pain over both ankle and feet, has lateral knee pain Heme: No easy bruising.  Travel history: No recent long distant travel.  Allergy:  Allergies  Allergen Reactions  . Iohexol Other (See Comments)    "severe burning" Patient has received Contrast in 2005 with 13 hour pre-medication, and had no reaction at that time  . Spiriva Handihaler [Tiotropium Bromide Monohydrate] Nausea And Vomiting  . Ativan [Lorazepam] Other (See Comments)    hallucinations  . Penicillins Hives    Past Medical History  Diagnosis Date  . Glaucoma   . Diabetes mellitus without complication   . Hypertension   . Neuropathy   . Crohn disease   . Asthma   . Pulmonary embolus 05/24/2014  . Hypothyroidism   . GERD (gastroesophageal reflux disease)   . HLD (hyperlipidemia)   . C. difficile colitis   . Pancreatitis   . Asthma   . Tobacco abuse     Past Surgical History  Procedure Laterality Date  . Cesarean section    .  Esophagogastroduodenoscopy N/A 05/21/2014    Procedure: ESOPHAGOGASTRODUODENOSCOPY (EGD);  Surgeon: Petra Kuba, MD;  Location: [facility] ENDOSCOPY;  Service: Endoscopy;  Laterality: N/A;  . Esophagogastroduodenoscopy N/A 03/02/2015    Procedure: ESOPHAGOGASTRODUODENOSCOPY (EGD);  Surgeon: Bernette Redbird, MD;  Location: [facility] ENDOSCOPY;  Service: Endoscopy;  Laterality: N/A;  .  Colonoscopy N/A 03/02/2015    Procedure: COLONOSCOPY;  Surgeon: Bernette Redbird, MD;  Location: [facility] ENDOSCOPY;  Service: Endoscopy;  Laterality: N/A;    Social History:  reports that she has been smoking Cigarettes.  She has a 6.5 pack-year smoking history. She has never used smokeless tobacco. She reports that she does not drink alcohol or use illicit drugs.  Family History:  Family History  Problem Relation Age of Onset  . Breast cancer Mother   . Heart disease Mother 9    CABG  . Diabetes Mother   . Heart disease Father 24    CABG  . Diabetes Father      Prior to Admission medications   Medication Sig Start Date End Date Taking? Authorizing Provider  apixaban (ELIQUIS) 5 MG TABS tablet Take 5 mg by mouth 2 (two) times daily.   Yes Historical Provider, MD  calcium carbonate (OS-CAL - DOSED IN MG OF ELEMENTAL CALCIUM) 1250 (500 CA) MG tablet Take 1 tablet (500 mg of elemental calcium total) by mouth daily with breakfast. 03/04/15  Yes Alison Murray, MD  feeding supplement, RESOURCE BREEZE, (RESOURCE BREEZE) LIQD Take 1 Container by mouth 3 (three) times daily between meals. 08/02/14  Yes Kathlen Mody, MD  folic acid (FOLVITE) 1 MG tablet Take 1 tablet (1 mg total) by mouth daily. 08/02/14  Yes Kathlen Mody, MD  gabapentin (NEURONTIN) 300 MG capsule Take 2 capsules (600 mg total) by mouth 2 (two) times daily. 03/24/15  Yes Jeralyn Bennett, MD  levothyroxine (SYNTHROID, LEVOTHROID) 25 MCG tablet Take 1 tablet (25 mcg total) by mouth daily before breakfast. 08/02/14  Yes Kathlen Mody, MD  meclizine (ANTIVERT) 25 MG tablet Take 25 mg by mouth 3 (three) times daily as needed for dizziness.   Yes Historical Provider, MD  ondansetron (ZOFRAN) 4 MG tablet Take 4 mg by mouth every 8 (eight) hours as needed for nausea or vomiting.   Yes Historical Provider, MD  pantoprazole (PROTONIX) 20 MG tablet Take 2 tablets (40 mg total) by mouth daily. 08/02/14  Yes Kathlen Mody, MD  rosuvastatin (CRESTOR) 20 MG  tablet Take 1 tablet (20 mg total) by mouth daily. 08/02/14  Yes Kathlen Mody, MD  thiamine (VITAMIN B-1) 100 MG tablet Take 1 tablet (100 mg total) by mouth daily. 08/02/14  Yes Kathlen Mody, MD  traZODone (DESYREL) 25 mg TABS tablet Take 0.5 tablets (25 mg total) by mouth at bedtime as needed for sleep. 08/02/14  Yes Kathlen Mody, MD  vancomycin (VANCOCIN) 50 mg/mL oral solution 125 mg PO four times daily, qty sufficient for 12 days 03/24/15  Yes Jeralyn Bennett, MD  venlafaxine (EFFEXOR) 25 MG tablet Take 1 tablet (25 mg total) by mouth 2 (two) times daily. 03/04/15  Yes Alison Murray, MD    Physical Exam: Filed Vitals:   03/28/15 2129 03/28/15 2215 03/28/15 2230 03/28/15 2245  BP: 107/74 116/83 104/71 105/74  Pulse: 87 104 86 103  Temp:      TempSrc:      Resp: 14 20 15 14   Height:      Weight:      SpO2: 100% 100% 99% 99%   General: Not in  acute distress. Patient is a thin and deconditioned appearing. Pale looking. HEENT:       Eyes: PERRL, EOMI, no scleral icterus.       ENT: No discharge from the ears and nose, no pharynx injection, no tonsillar enlargement.        Neck: No JVD, no bruit, no mass felt. Heme: No neck lymph node enlargement. Cardiac: S1/S2, RRR, No murmurs, No gallops or rubs. Pulm: No rales, wheezing, rhonchi or rubs. Abd: Soft, nondistended, mild diffuse tenderness, no rebound pain, no organomegaly, BS present. Ext: No pitting leg edema bilaterally. 2+DP/PT pulse bilaterally. Musculoskeletal: has swelling and tenderness over both ankles and feet, has lateral knee tenderness, but no knee swelling. Skin: No rashes.  Neuro: Alert, oriented X3, cranial nerves II-XII grossly intact, muscle strength 5/5 in all extremities, sensation to light touch intact.  Psych: Patient is not psychotic, no suicidal or hemocidal ideation.  Labs on Admission:  Basic Metabolic Panel:  Recent Labs Lab 03/22/15 0507 03/22/15 1235 03/24/15 0428  NA  --   --  142  K  --   --  4.7   CL  --   --  113*  CO2  --   --  20*  GLUCOSE  --   --  142*  BUN  --   --  <5*  CREATININE  --   --  0.70  CALCIUM  --   --  7.5*  MG 1.3* 3.1*  --    Liver Function Tests: No results for input(s): AST, ALT, ALKPHOS, BILITOT, PROT, ALBUMIN in the last 168 hours. No results for input(s): LIPASE, AMYLASE in the last 168 hours. No results for input(s): AMMONIA in the last 168 hours. CBC:  Recent Labs Lab 03/22/15 1235 03/24/15 0428  WBC 7.5 10.2  HGB 9.4* 9.4*  HCT 28.8* 30.3*  MCV 92.3 95.9  PLT PLATELET CLUMPS NOTED ON SMEAR, UNABLE TO ESTIMATE 138*   Cardiac Enzymes: No results for input(s): CKTOTAL, CKMB, CKMBINDEX, TROPONINI in the last 168 hours.  BNP (last 3 results)  Recent Labs  12/28/14 1905  BNP 77.5    ProBNP (last 3 results)  Recent Labs  07/26/14 1138  PROBNP 440.7*    CBG: No results for input(s): GLUCAP in the last 168 hours.  Radiological Exams on Admission: Dg Knee Complete 4 Views Left  03/28/2015   CLINICAL DATA:  Left knee pain. Patient reports being dropped by her son. Blood infection.  EXAM: LEFT KNEE - COMPLETE 4+ VIEW  COMPARISON:  None.  FINDINGS: Comminuted fracture of the proximal tibia primarily involving the metaphysis. No definite involvement of the articular surface or joint effusion/ lipohemarthrosis to suspect intra-articular extension. There is a minimally displaced proximal fibular neck fracture. 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  IMPRESSION: 1. Comminuted fracture of the proximal tibial metaphysis. No radiographic findings of intra-articular extension. 2. Mildly displaced fibular neck fracture. 3. 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   Electronically Signed   By: Rubye Oaks M.D.   On: 03/28/2015 20:47   Dg Knee Complete 4 Views Right  03/28/2015   CLINICAL DATA:  Right knee pain. Patient reports being dropped by her son. Blood infection.  EXAM: RIGHT KNEE - COMPLETE 4+ VIEW   COMPARISON:  None.  FINDINGS: There is a comminuted mildly displaced fracture of the proximal tibial metaphysis. Proximal fractional involves the posterior medial cortex, distal fracture line the anterior lateral cortex. There is no definite extension to the  articular surface. The bones are diffusely under mineralized. Probable osteonecrosis of the medial and lateral femoral condyles. No joint effusion or lipohemarthrosis.  IMPRESSION: 1. Comminuted mildly displaced fracture of the proximal tibial metaphysis. No definite intra-articular extension. 2. Diffuse bony under mineralization with osteonecrosis of the femoral condyles.   Electronically Signed   By: Rubye Oaks M.D.   On: 03/28/2015 20:44   Dg Foot Complete Left  03/28/2015   CLINICAL DATA:  Stated history of knee pain and blood infection. Patient reports being dropped by her son, lower leg swelling.  EXAM: LEFT FOOT - COMPLETE 3+ VIEW  COMPARISON:  None.  FINDINGS: Diffuse bony under mineralization. No fracture or dislocation. The alignment and joint spaces are maintained. No erosions or periosteal reaction. Question bone infarct in the calcaneus and distal tibia. Mild soft tissue edema.  IMPRESSION: Diffuse bony under mineralization and probable bone infarcts in the calcaneus and distal tibia. No acute osseous abnormality is seen.   Electronically Signed   By: Rubye Oaks M.D.   On: 03/28/2015 20:39   Dg Foot Complete Right  03/28/2015   CLINICAL DATA:  Stated history of knee pain and blood infection. Patient reports being dropped by her son, lower leg swelling.  EXAM: RIGHT FOOT COMPLETE - 3+ VIEW  COMPARISON:  No prior exams. Please note the lateral foot radiograph is associated with the right knee exam performed concurrently.  FINDINGS: The bones are under mineralized. No fracture or dislocation. The alignment and joint spaces are maintained. Probable bone infarcts involving the calcaneus and distal tibia. Avascular necrosis suspected of  the second metatarsal head. No erosion or periosteal reaction. Mild diffuse soft tissue edema.  IMPRESSION: Diffuse bony under mineralization with probable bone infarcts in the distal tibia and calcaneus. 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 No definite acute bony abnormality.   Electronically Signed   By: Rubye Oaks M.D.   On: 03/28/2015 20:42    EKG: Not done in ED, will get one.   Assessment/Plan Principal Problem:   Fracture, tibia Active Problems:   DM2 (diabetes mellitus, type 2)   Anxiety state   Depression   Peripheral neuropathy   Asthma   Normocytic anemia   Nicotine abuse   History of pulmonary embolus (PE)   Chest pain, musculoskeletal   Severe protein-calorie malnutrition   Fall   Enteritis due to Clostridium difficile   Hypothyroidism   GERD (gastroesophageal reflux disease)   HLD (hyperlipidemia)   Sepsis   Tibial fracture  Addendum:   1. CBC showed hgb dropped from 9.4-->5.7. Likely patient has internal bleeding. -will transfer 2U of blood -cbc q6h -Hold Heparin and Eliquis -follow up CXR  -CT-abd/pelvis to r/o intraabdominal bleeding -FOBT  2. Hypokalemia: K=3.3 -Repeleted.   Bilateral tibia fracture: As evidenced by x-ray. Patient has moderate pain now. No neurovascular compromise. Orthopedic surgeon was consulted.  According to previous discharge summary, there was concerning that patient may have drug-seeking behavior, but now patient has true pain from bony fracture. I think it is appropriate to treat the patient with pain medications now.  - will admit to tele bed - Pain control: prn morphine prn and percocet - follow up ortho recs - NPO - type and cross, INR/PTT  Hx of PE: On Eliquis at home. -will switch to IV heparin per pharmcy which can be easily discontinued if needs surgery  C. difficile colitis and sepsis: Patient's symptoms have improved after having taken a few days of oral vancomycine (from  6/20 to 6/23).  Patient has not been compliant to oral vancomycin after. She said that she had financial difficulty to afford the medications. Patient is clinically septic with tachycardia and fever.  -restart oral vancomycin, 125 mg by mouth 4 times a day -Hospital case manager to help her with her medication need -will get Procalcitonin and trend lactic acid levels per sepsis protocol. -IVF: 2L of NS bolus in ED, followed by 75 cc/h-->switched to D5-1/2 NS 75 cc/h due to CBG=57.   Diet controlled DM-II: Last A1c 4.9 on 05/08/14, well controled. Patient is not taking med at home. - cbc q AM  Depression and anxiety: Stable, no suicidal or homicidal ideations. -Continue home medications: Effexor, trazodone  GERD: -Protonix  Hypothyroidism: Last TSH was 0.659 on 02/28/15 -Continue home Synthroid  HLD: Last LDL was 120 on 03/27/09 -Continue home medications: Crestor -Check FLP   DVT ppx: Patient has hx of PE, not sure whether she had DVT (though she reports no hx of DVT, but she is not a good historian). Will not start SCD. Due to hgb drop from 9.4-->5.7, with possible internal bleeding, will hold Heparin and Eliquis. I discussed with Pharmacist, who told me that patient is still under DVT protectionfrom previous dose of Eliquis at least for tonight (her heparin level 1.08).  Need to consider appropriate DVT PPx in AM depending on the further work up results.  Code Status: Full code Family Communication: None at bed side.  Disposition Plan: Admit to inpatient   Date of Service 03/28/2015    Lorretta Harp [facility] Pager [phone]  If 7PM-7AM, please contact night-coverage [email or web address] Password [facility] 03/28/2015, 11:58 PM

## 2015-03-29 ENCOUNTER — Inpatient Hospital Stay (HOSPITAL_COMMUNITY): Payer: Medicare Other

## 2015-03-29 DIAGNOSIS — M79609 Pain in unspecified limb: Secondary | ICD-10-CM

## 2015-03-29 DIAGNOSIS — E11319 Type 2 diabetes mellitus with unspecified diabetic retinopathy without macular edema: Secondary | ICD-10-CM

## 2015-03-29 DIAGNOSIS — R52 Pain, unspecified: Secondary | ICD-10-CM

## 2015-03-29 LAB — CBC WITH DIFFERENTIAL/PLATELET
Basophils Absolute: 0.1 K/uL (ref 0.0–0.1)
Basophils Relative: 1 % (ref 0–1)
Eosinophils Absolute: 0.1 K/uL (ref 0.0–0.7)
Eosinophils Relative: 1 % (ref 0–5)
HCT: 18 % — ABNORMAL LOW (ref 36.0–46.0)
Hemoglobin: 5.7 g/dL — CL (ref 12.0–15.0)
Lymphocytes Relative: 33 % (ref 12–46)
Lymphs Abs: 2.9 K/uL (ref 0.7–4.0)
MCH: 30.2 pg (ref 26.0–34.0)
MCHC: 31.7 g/dL (ref 30.0–36.0)
MCV: 95.2 fL (ref 78.0–100.0)
Monocytes Absolute: 0.7 K/uL (ref 0.1–1.0)
Monocytes Relative: 8 % (ref 3–12)
Neutro Abs: 5.2 K/uL (ref 1.7–7.7)
Neutrophils Relative %: 59 % (ref 43–77)
Platelets: 127 K/uL — ABNORMAL LOW (ref 150–400)
RBC: 1.89 MIL/uL — ABNORMAL LOW (ref 3.87–5.11)
RDW: 20.2 % — ABNORMAL HIGH (ref 11.5–15.5)
WBC: 8.9 K/uL (ref 4.0–10.5)

## 2015-03-29 LAB — LIPID PANEL
Cholesterol: 80 mg/dL (ref 0–200)
HDL: 16 mg/dL — ABNORMAL LOW (ref >40)
LDL CALC: 37 mg/dL (ref 0–99)
Total CHOL/HDL Ratio: 5 RATIO
Triglycerides: 133 mg/dL (ref <150)
VLDL: 27 mg/dL (ref 0–40)

## 2015-03-29 LAB — BASIC METABOLIC PANEL WITH GFR
Anion gap: 11 (ref 5–15)
BUN: <5 mg/dL — ABNORMAL LOW (ref 6–20)
CO2: 21 mmol/L — ABNORMAL LOW (ref 22–32)
Calcium: 7 mg/dL — ABNORMAL LOW (ref 8.9–10.3)
Chloride: 115 mmol/L — ABNORMAL HIGH (ref 101–111)
Creatinine, Ser: 0.68 mg/dL (ref 0.44–1.00)
GFR calc Af Amer: >60 mL/min
GFR calc non Af Amer: >60 mL/min
Glucose, Bld: 57 mg/dL — ABNORMAL LOW (ref 65–99)
Potassium: 3.3 mmol/L — ABNORMAL LOW (ref 3.5–5.1)
Sodium: 147 mmol/L — ABNORMAL HIGH (ref 135–145)

## 2015-03-29 LAB — MAGNESIUM: MAGNESIUM: 1.6 mg/dL — AB (ref 1.7–2.4)

## 2015-03-29 LAB — CBC
HCT: 16.8 % — ABNORMAL LOW (ref 36.0–46.0)
HCT: 34.4 % — ABNORMAL LOW (ref 36.0–46.0)
HEMATOCRIT: 32.1 % — AB (ref 36.0–46.0)
HEMOGLOBIN: 10.7 g/dL — AB (ref 12.0–15.0)
Hemoglobin: 5.3 g/dL — CL (ref 12.0–15.0)
Hemoglobin: 11.5 g/dL — ABNORMAL LOW (ref 12.0–15.0)
MCH: 30.1 pg (ref 26.0–34.0)
MCH: 30.3 pg (ref 26.0–34.0)
MCH: 29.6 pg (ref 26.0–34.0)
MCHC: 31.5 g/dL (ref 30.0–36.0)
MCHC: 33.4 g/dL (ref 30.0–36.0)
MCHC: 33.3 g/dL (ref 30.0–36.0)
MCV: 95.5 fL (ref 78.0–100.0)
MCV: 90.8 fL (ref 78.0–100.0)
MCV: 88.9 fL (ref 78.0–100.0)
PLATELETS: 109 10*3/uL — AB (ref 150–400)
Platelets: 101 10*3/uL — ABNORMAL LOW (ref 150–400)
Platelets: 91 10*3/uL — ABNORMAL LOW (ref 150–400)
RBC: 1.76 MIL/uL — ABNORMAL LOW (ref 3.87–5.11)
RBC: 3.79 MIL/uL — AB (ref 3.87–5.11)
RBC: 3.61 MIL/uL — ABNORMAL LOW (ref 3.87–5.11)
RDW: 20.2 % — AB (ref 11.5–15.5)
RDW: 17.7 % — AB (ref 11.5–15.5)
RDW: 17.5 % — ABNORMAL HIGH (ref 11.5–15.5)
WBC: 8.5 10*3/uL (ref 4.0–10.5)
WBC: 8.7 10*3/uL (ref 4.0–10.5)
WBC: 7.8 10*3/uL (ref 4.0–10.5)

## 2015-03-29 LAB — COMPREHENSIVE METABOLIC PANEL
ALBUMIN: 1.6 g/dL — AB (ref 3.5–5.0)
ALT: 18 U/L (ref 14–54)
AST: 26 U/L (ref 15–41)
Alkaline Phosphatase: 162 U/L — ABNORMAL HIGH (ref 38–126)
Anion gap: 9 (ref 5–15)
BILIRUBIN TOTAL: 1.9 mg/dL — AB (ref 0.3–1.2)
CO2: 20 mmol/L — AB (ref 22–32)
CREATININE: 0.69 mg/dL (ref 0.44–1.00)
Calcium: 7.2 mg/dL — ABNORMAL LOW (ref 8.9–10.3)
Chloride: 117 mmol/L — ABNORMAL HIGH (ref 101–111)
GFR calc Af Amer: >60 mL/min (ref >60)
GFR calc non Af Amer: >60 mL/min (ref >60)
Glucose, Bld: 59 mg/dL — ABNORMAL LOW (ref 65–99)
Potassium: 3.5 mmol/L (ref 3.5–5.1)
Sodium: 146 mmol/L — ABNORMAL HIGH (ref 135–145)
TOTAL PROTEIN: 4.7 g/dL — AB (ref 6.5–8.1)

## 2015-03-29 LAB — PREPARE RBC (CROSSMATCH)

## 2015-03-29 LAB — HIV ANTIBODY (ROUTINE TESTING W REFLEX): HIV Screen 4th Generation wRfx: NONREACTIVE

## 2015-03-29 LAB — GLUCOSE, CAPILLARY
GLUCOSE-CAPILLARY: 58 mg/dL — AB (ref 65–99)
Glucose-Capillary: 74 mg/dL (ref 65–99)
Glucose-Capillary: 81 mg/dL (ref 65–99)

## 2015-03-29 LAB — PROCALCITONIN: PROCALCITONIN: 4.42 ng/mL

## 2015-03-29 LAB — PROTIME-INR
INR: 1.47 (ref 0.00–1.49)
PROTHROMBIN TIME: 17.9 s — AB (ref 11.6–15.2)

## 2015-03-29 LAB — APTT
APTT: 40 s — AB (ref 24–37)
aPTT: 36 seconds (ref 24–37)

## 2015-03-29 LAB — LACTIC ACID, PLASMA: Lactic Acid, Venous: 1.2 mmol/L (ref 0.5–2.0)

## 2015-03-29 LAB — OCCULT BLOOD X 1 CARD TO LAB, STOOL: FECAL OCCULT BLD: POSITIVE — AB

## 2015-03-29 LAB — HEPARIN LEVEL (UNFRACTIONATED): Heparin Unfractionated: 1.08 IU/mL — ABNORMAL HIGH (ref 0.30–0.70)

## 2015-03-29 MED ORDER — NICOTINE 21 MG/24HR TD PT24
21.0000 mg | MEDICATED_PATCH | TRANSDERMAL | Status: DC
  Administered 2015-03-29 – 2015-04-05 (×7): 21 mg via TRANSDERMAL
  Filled 2015-03-29 (×9): qty 1

## 2015-03-29 MED ORDER — SODIUM CHLORIDE 0.9 % IV SOLN: Freq: Once | INTRAVENOUS | Status: DC

## 2015-03-29 MED ORDER — GLUCAGON HCL RDNA (DIAGNOSTIC) 1 MG IJ SOLR
1.0000 mg | Freq: Once | INTRAMUSCULAR | Status: AC
  Administered 2015-03-29: 1 mg via SUBCUTANEOUS
  Filled 2015-03-29: qty 1

## 2015-03-29 MED ORDER — FOLIC ACID 1 MG PO TABS
1.0000 mg | ORAL_TABLET | Freq: Every day | ORAL | Status: DC
  Administered 2015-03-29 – 2015-04-05 (×8): 1 mg via ORAL
  Filled 2015-03-29 (×9): qty 1

## 2015-03-29 MED ORDER — ROSUVASTATIN CALCIUM 20 MG PO TABS
20.0000 mg | ORAL_TABLET | Freq: Every day | ORAL | Status: DC
  Administered 2015-03-29: 20 mg via ORAL
  Filled 2015-03-29: qty 1

## 2015-03-29 MED ORDER — PANTOPRAZOLE SODIUM 40 MG PO TBEC
40.0000 mg | DELAYED_RELEASE_TABLET | Freq: Every day | ORAL | Status: DC
  Administered 2015-03-29 – 2015-04-02 (×5): 40 mg via ORAL
  Filled 2015-03-29 (×6): qty 1

## 2015-03-29 MED ORDER — OXYCODONE-ACETAMINOPHEN 5-325 MG PO TABS
1.0000 | ORAL_TABLET | ORAL | Status: DC | PRN
  Administered 2015-03-29 – 2015-04-02 (×6): 1 via ORAL
  Filled 2015-03-29 (×6): qty 1

## 2015-03-29 MED ORDER — POTASSIUM CHLORIDE 10 MEQ/100ML IV SOLN
10.0000 meq | INTRAVENOUS | Status: AC
Start: 2015-03-29 — End: 2015-03-29
  Administered 2015-03-29 (×2): 10 meq via INTRAVENOUS
  Filled 2015-03-29 (×2): qty 100

## 2015-03-29 MED ORDER — SODIUM CHLORIDE 0.9 % IJ SOLN
10.0000 mL | INTRAMUSCULAR | Status: DC | PRN
  Administered 2015-03-31 – 2015-04-04 (×4): 10 mL
  Filled 2015-03-29 (×4): qty 40

## 2015-03-29 MED ORDER — SODIUM CHLORIDE 0.9 % IJ SOLN
3.0000 mL | Freq: Two times a day (BID) | INTRAMUSCULAR | Status: DC
  Administered 2015-03-29 – 2015-04-03 (×2): 3 mL via INTRAVENOUS

## 2015-03-29 MED ORDER — MORPHINE SULFATE 2 MG/ML IJ SOLN
2.0000 mg | INTRAMUSCULAR | Status: DC | PRN
  Administered 2015-03-29 – 2015-04-02 (×11): 2 mg via INTRAVENOUS
  Filled 2015-03-29 (×12): qty 1

## 2015-03-29 MED ORDER — ACETAMINOPHEN 650 MG RE SUPP: 650.0000 mg | Freq: Four times a day (QID) | RECTAL | Status: DC | PRN

## 2015-03-29 MED ORDER — BOOST / RESOURCE BREEZE PO LIQD
1.0000 | Freq: Three times a day (TID) | ORAL | Status: DC
  Administered 2015-03-30 – 2015-04-05 (×10): 1 via ORAL

## 2015-03-29 MED ORDER — VENLAFAXINE HCL 25 MG PO TABS
25.0000 mg | ORAL_TABLET | Freq: Two times a day (BID) | ORAL | Status: DC
  Administered 2015-03-29 – 2015-04-05 (×13): 25 mg via ORAL
  Filled 2015-03-29 (×16): qty 1

## 2015-03-29 MED ORDER — VANCOMYCIN 50 MG/ML ORAL SOLUTION
125.0000 mg | Freq: Four times a day (QID) | ORAL | Status: DC
  Administered 2015-03-29 – 2015-04-05 (×30): 125 mg via ORAL
  Filled 2015-03-29 (×36): qty 2.5

## 2015-03-29 MED ORDER — CALCIUM CARBONATE 1250 (500 CA) MG PO TABS
1.0000 | ORAL_TABLET | Freq: Every day | ORAL | Status: DC
  Administered 2015-03-29 – 2015-04-05 (×8): 500 mg via ORAL
  Filled 2015-03-29 (×10): qty 1

## 2015-03-29 MED ORDER — LEVOTHYROXINE SODIUM 25 MCG PO TABS
25.0000 ug | ORAL_TABLET | Freq: Every day | ORAL | Status: DC
  Administered 2015-03-29 – 2015-04-05 (×8): 25 ug via ORAL
  Filled 2015-03-29 (×13): qty 1

## 2015-03-29 MED ORDER — ONDANSETRON HCL 4 MG/2ML IJ SOLN: 4.0000 mg | Freq: Three times a day (TID) | INTRAMUSCULAR | Status: DC | PRN

## 2015-03-29 MED ORDER — DEXTROSE-NACL 5-0.45 % IV SOLN
INTRAVENOUS | Status: DC
  Administered 2015-03-29 – 2015-03-31 (×4): via INTRAVENOUS

## 2015-03-29 MED ORDER — GABAPENTIN 300 MG PO CAPS
600.0000 mg | ORAL_CAPSULE | Freq: Two times a day (BID) | ORAL | Status: DC
  Administered 2015-03-29: 600 mg via ORAL
  Filled 2015-03-29 (×2): qty 2

## 2015-03-29 MED ORDER — VITAMIN B-1 100 MG PO TABS
100.0000 mg | ORAL_TABLET | Freq: Every day | ORAL | Status: DC
  Administered 2015-03-29 – 2015-04-05 (×8): 100 mg via ORAL
  Filled 2015-03-29 (×9): qty 1

## 2015-03-29 MED ORDER — ALUM & MAG HYDROXIDE-SIMETH 200-200-20 MG/5ML PO SUSP: 30.0000 mL | Freq: Four times a day (QID) | ORAL | Status: DC | PRN

## 2015-03-29 MED ORDER — TRAZODONE 25 MG HALF TABLET
25.0000 mg | ORAL_TABLET | Freq: Every evening | ORAL | Status: DC | PRN
  Administered 2015-03-29 – 2015-04-03 (×4): 25 mg via ORAL
  Filled 2015-03-29 (×7): qty 1

## 2015-03-29 MED ORDER — ACETAMINOPHEN 325 MG PO TABS
650.0000 mg | ORAL_TABLET | Freq: Four times a day (QID) | ORAL | Status: DC | PRN
  Administered 2015-03-31 – 2015-04-03 (×2): 650 mg via ORAL
  Filled 2015-03-29 (×2): qty 2

## 2015-03-29 MED ORDER — MECLIZINE HCL 25 MG PO TABS
25.0000 mg | ORAL_TABLET | Freq: Three times a day (TID) | ORAL | Status: DC | PRN
  Filled 2015-03-29: qty 1

## 2015-03-29 NOTE — Progress Notes (Signed)
Peripherally Inserted Central Catheter/Midline Placement  The IV Nurse has discussed with the patient and/or persons authorized to consent for the patient, the purpose of this procedure and the potential benefits and risks involved with this procedure.  The benefits include less needle sticks, lab draws from the catheter and patient may be discharged home with the catheter.  Risks include, but not limited to, infection, bleeding, blood clot (thrombus formation), and puncture of an artery; nerve damage and irregular heat beat.  Alternatives to this procedure were also discussed.  PICC/Midline Placement Documentation  PICC / Midline Double Lumen 03/29/15 PICC Right Brachial 36 cm 0 cm (Active)  Indication for Insertion or Continuance of Line Poor Vasculature-patient has had multiple peripheral attempts or PIVs lasting less than 24 hours 03/29/2015 10:05 PM  Exposed Catheter (cm) 0 cm 03/29/2015 10:05 PM  Site Assessment Clean;Dry;Intact 03/29/2015 10:05 PM  Lumen #1 Status Flushed;Saline locked;Blood return noted 03/29/2015 10:05 PM  Lumen #2 Status Flushed;Saline locked;Blood return noted 03/29/2015 10:05 PM  Dressing Type Transparent 03/29/2015 10:05 PM  Dressing Status Clean;Dry;Intact;Antimicrobial disc in place 03/29/2015 10:05 PM  Dressing Intervention New dressing 03/29/2015 10:05 PM  Dressing Change Due 04/05/15 03/29/2015 10:05 PM       Jontavius Rabalais, Anderson [place] 03/29/2015, 10:08 PM

## 2015-03-29 NOTE — Progress Notes (Addendum)
*  PRELIMINARY RESULTS* Vascular Ultrasound Lower extremity venous duplex has been completed.  Preliminary findings: Technically difficult due to poor patient cooperation. Negative for DVT in visualized veins bilaterally. Small baker's cyst is noted on the right.   Farrel Demark, RDMS, RVT  03/29/2015, 11:27 AM

## 2015-03-29 NOTE — Progress Notes (Addendum)
[facility] PROGRESS NOTE  DANICA CAMARENA GBM:[id number] DOB: 18-Nov-1960 DOA: 03/28/2015 PCP: Lottie Dawson, MD  Assessment/Plan: is a 54 y.o. female with PMH of hypertension, hyperlipidemia, diet-controlled diabetes mellitus, GERD, hypothyroidism, depression, chronic dizziness, history of PE on Eliquis, pancreatitis, asthma, recently diagnosed C. difficile colitis (C. difficile PCR positive on 03/21/15), who presents with bilateral knee pain and foot pain, and chest wall pain after fall.  1-Bilateral tibia fracture I have order Knee immobilizer.  Dr Lorin Mercy consulted.  Awaiting ortho recommendation.   2-Anemia; acute blood loss.; Continue to cycle Hb Hold anticoagulation, eliquis.  Received 2 units PRBC hb at 10. CT Abdomen showed: Increased density in the subcutaneous tissues of both flanks. While this may reflect edema, in the setting of decreased blood counts, soft tissue hemorrhage is considered.  3-Chest pain:  Rib X ray: Right fifth through eighth anterior lateral rib fractures. No Pneumothorax. Incentive spirometry.   4-History of PE; diagnosed 05-2014. eliquis was discontinue 11-2014. Resume during last admission in June.  Due to acute blood loss and severe anemia. I will hold eliquis. Will hold also heparin./  When Hb stable to will need DVT prophylaxis. I repeated doppler LE today and are negative for DVT.   C. difficile colitis and sepsis Diagnosed last admission. Never fished treatment. Was not able to afford medication.  Vancomycin resume/  DM-II: Last A1c 4.9 on 05/08/14, well controled. Patient is not taking med at home. - cbc q AM  Hypothyroidism: Last TSH was 0.659 on 02/28/15 -Continue home Synthroid  Increase bilirubin, ALK phosphatase; hold Crestor.   Encephalopathy Lethargic. Check CBG. Ammonia. Carefull with pain medications.   Code Status: Full Code.  Family Communication: none at bedside.  Disposition Plan: remain inpatient.     Consultants:  Ortho D r yates   Procedures:  none  Antibiotics:  Oral vancomycin  HPI/Subjective:   Objective: Filed Vitals:   03/29/15 1432  BP: 124/83  Pulse: 68  Temp: 98.2 F (36.8 C)  Resp: 16    Intake/Output Summary (Last 24 hours) at 03/29/15 1509 Last data filed at 03/29/15 1300  Gross per 24 hour  Intake    960 ml  Output      0 ml  Net    960 ml   Filed Weights   03/28/15 1939 03/29/15 0023  Weight: 45.813 kg (101 lb) 41.504 kg (91 lb 8 oz)    Exam:   General:  Lethargic, wake up say few words.   Cardiovascular: S 1, S 2 RRR  Respiratory: CTA  Abdomen: BS present, soft, nt  Musculoskeletal: trace edema  Data Reviewed: Basic Metabolic Panel:  Recent Labs Lab 03/24/15 0428 03/28/15 2334 03/29/15 1036  NA 142 147* 146*  K 4.7 3.3* 3.5  CL 113* 115* 117*  CO2 20* 21* 20*  GLUCOSE 142* 57* 59*  BUN <5* <5* <5*  CREATININE 0.70 0.68 0.69  CALCIUM 7.5* 7.0* 7.2*  MG  --   --  1.6*   Liver Function Tests:  Recent Labs Lab 03/29/15 1036  AST 26  ALT 18  ALKPHOS 162*  BILITOT 1.9*  PROT 4.7*  ALBUMIN 1.6*   No results for input(s): LIPASE, AMYLASE in the last 168 hours. No results for input(s): AMMONIA in the last 168 hours. CBC:  Recent Labs Lab 03/24/15 0428 03/28/15 2334 03/29/15 0246 03/29/15 1036 03/29/15 1219  WBC 10.2 8.9 8.5 8.7 7.8  NEUTROABS  --  5.2  --   --   --  HGB 9.4* 5.7* 5.3* 11.5* 10.7*  HCT 30.3* 18.0* 16.8* 34.4* 32.1*  MCV 95.9 95.2 95.5 90.8 88.9  PLT 138* 127* 109* 101* 91*   Cardiac Enzymes: No results for input(s): CKTOTAL, CKMB, CKMBINDEX, TROPONINI in the last 168 hours. BNP (last 3 results)  Recent Labs  12/28/14 1905  BNP 77.5    ProBNP (last 3 results)  Recent Labs  07/26/14 1138  PROBNP 440.7*    CBG:  Recent Labs Lab 03/29/15 0759  GLUCAP 74    Recent Results (from the past 240 hour(s))  Clostridium Difficile by PCR (not at [facility])     Status: Abnormal    Collection Time: 03/21/15  7:23 PM  Result Value Ref Range Status   C difficile by pcr POSITIVE (A) NEGATIVE Final    Comment: CRITICAL RESULT CALLED TO, READ BACK BY AND VERIFIED WITH: B BEAM RN 2054 03/21/15 A BROWNING      Studies: Ct Abdomen Pelvis Wo Contrast  03/29/2015   CLINICAL DATA:  Anemia, decreased hemoglobin. Evaluate for intra-abdominal bleeding. Patient was on Eliquis.  EXAM: CT ABDOMEN AND PELVIS WITHOUT CONTRAST  TECHNIQUE: Multidetector CT imaging of the abdomen and pelvis was performed following the standard protocol without IV contrast.  COMPARISON:  Contrast enhanced CT 02/28/2015  FINDINGS: The included lung bases are clear.  Hypodense lesion in the dome of the right lobe of the liver, 2.1 x 1.8 cm, unchanged from prior exam. Mild focal fatty infiltration adjacent with falciform ligament. There is otherwise homogeneous attenuation throughout the hepatic parenchyma. Gallbladder is decompressed. Dilatation of the distal common bile duct measuring 10 mm, not significantly changed. Spleen is small in size with homogeneous attenuation. Pancreatic atrophy without surrounding inflammation. No adrenal nodule. There is mild right renal atrophy, punctate calcifications in the upper and lower right kidney. Punctate calcifications throughout the left kidney, no hydronephrosis.  No retroperitoneal fluid or hemorrhage. The abdominal aorta is normal in caliber with atherosclerosis.  Stomach is decompressed. Limited bowel assessment. No definite dilated or thickened bowel loops. The appendix is normal. Small volume of colonic stool. No free air, free fluid, or intra-abdominal fluid collection.  Increased density in the subcutaneous tissues of the bilateral flank soft tissues.  Urinary bladder is physiologically distended.  No pelvic free fluid.  The bones are under mineralized. Minimal compression deformity superior endplate of K53, unchanged from prior. Bone island in the left iliac bone. No  fracture of the bony pelvis or lumbar spine.  IMPRESSION: 1. Increased density in the subcutaneous tissues of both flanks. While this may reflect edema, in the setting of decreased blood counts, soft tissue hemorrhage is considered. 2. No findings of intra-abdominal hemorrhage or traumatic injury, allowing for lack of contrast.   Electronically Signed   By: Jeb Levering M.D.   On: 03/29/2015 02:25   Dg Knee Complete 4 Views Left  03/28/2015   CLINICAL DATA:  Left knee pain. Patient reports being dropped by her son. Blood infection.  EXAM: LEFT KNEE - COMPLETE 4+ VIEW  COMPARISON:  None.  FINDINGS: Comminuted fracture of the proximal tibia primarily involving the metaphysis. No definite involvement of the articular surface or joint effusion/ lipohemarthrosis to suspect intra-articular extension. There is a minimally displaced proximal fibular neck fracture. 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  IMPRESSION: 1. Comminuted fracture of the proximal tibial metaphysis. No radiographic findings of intra-articular extension. 2. Mildly displaced fibular neck fracture. 3. 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   Electronically  Signed   By: Jeb Levering M.D.   On: 03/28/2015 20:47   Dg Knee Complete 4 Views Right  03/28/2015   CLINICAL DATA:  Right knee pain. Patient reports being dropped by her son. Blood infection.  EXAM: RIGHT KNEE - COMPLETE 4+ VIEW  COMPARISON:  None.  FINDINGS: There is a comminuted mildly displaced fracture of the proximal tibial metaphysis. Proximal fractional involves the posterior medial cortex, distal fracture line the anterior lateral cortex. There is no definite extension to the articular surface. The bones are diffusely under mineralized. Probable osteonecrosis of the medial and lateral femoral condyles. No joint effusion or lipohemarthrosis.  IMPRESSION: 1. Comminuted mildly displaced fracture of the proximal tibial metaphysis. No definite  intra-articular extension. 2. Diffuse bony under mineralization with osteonecrosis of the femoral condyles.   Electronically Signed   By: Jeb Levering M.D.   On: 03/28/2015 20:44   Dg Foot Complete Left  03/28/2015   CLINICAL DATA:  Stated history of knee pain and blood infection. Patient reports being dropped by her son, lower leg swelling.  EXAM: LEFT FOOT - COMPLETE 3+ VIEW  COMPARISON:  None.  FINDINGS: Diffuse bony under mineralization. No fracture or dislocation. The alignment and joint spaces are maintained. No erosions or periosteal reaction. Question bone infarct in the calcaneus and distal tibia. Mild soft tissue edema.  IMPRESSION: Diffuse bony under mineralization and probable bone infarcts in the calcaneus and distal tibia. No acute osseous abnormality is seen.   Electronically Signed   By: Jeb Levering M.D.   On: 03/28/2015 20:39   Dg Foot Complete Right  03/28/2015   CLINICAL DATA:  Stated history of knee pain and blood infection. Patient reports being dropped by her son, lower leg swelling.  EXAM: RIGHT FOOT COMPLETE - 3+ VIEW  COMPARISON:  No prior exams. Please note the lateral foot radiograph is associated with the right knee exam performed concurrently.  FINDINGS: The bones are under mineralized. No fracture or dislocation. The alignment and joint spaces are maintained. Probable bone infarcts involving the calcaneus and distal tibia. Avascular necrosis suspected of the second metatarsal head. No erosion or periosteal reaction. Mild diffuse soft tissue edema.  IMPRESSION: Diffuse bony under mineralization with probable bone infarcts in the distal tibia and calcaneus. 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 No definite acute bony abnormality.   Electronically Signed   By: Jeb Levering M.D.   On: 03/28/2015 20:42    Scheduled Meds: . sodium chloride   Intravenous Once  . calcium carbonate  1 tablet Oral Q breakfast  . feeding supplement (RESOURCE BREEZE)  1  Container Oral TID BM  . folic acid  1 mg Oral Daily  . levothyroxine  25 mcg Oral QAC breakfast  . nicotine  21 mg Transdermal Q24H  . pantoprazole  40 mg Oral Daily  . rosuvastatin  20 mg Oral Daily  . sodium chloride  3 mL Intravenous Q12H  . thiamine  100 mg Oral Daily  . vancomycin  125 mg Oral 4 times per day  . venlafaxine  25 mg Oral BID   Continuous Infusions: . dextrose 5 % and 0.45% NaCl Stopped (03/29/15 0340)    Principal Problem:   Fracture, tibia Active Problems:   DM2 (diabetes mellitus, type 2)   Anxiety state   Depression   Peripheral neuropathy   Asthma   Normocytic anemia   Nicotine abuse   History of pulmonary embolus (PE)   Chest pain, musculoskeletal  Severe protein-calorie malnutrition   Fall   Enteritis due to Clostridium difficile   Hypothyroidism   GERD (gastroesophageal reflux disease)   HLD (hyperlipidemia)   Sepsis   Tibial fracture    Time spent: 35 minutes.     Niel Hummer A  [facility] Pager [phone]. If 7PM-7AM, please contact night-coverage at [email or web address], password [facility] 03/29/2015, 3:09 PM  LOS: 1 day

## 2015-03-29 NOTE — Progress Notes (Signed)
OT Cancellation Note  Patient Details Name: [name] MRN: [id number] DOB: [birth year]   Cancelled Treatment:    Reason Eval/Treat Not Completed: Medical issues which prohibited therapy. Pt with bilateral tibia fractures and awaiting ortho consult. Will hold off on OT evaluation until receive ortho recommendations. Also, pt with hemoglobin 5.3 when last checked.  Earlie Raveling OTR/L [phone] 03/29/2015, 10:07 AM

## 2015-03-29 NOTE — Progress Notes (Signed)
Dear Doctor: Regal[place] This patient has been identified as a candidate for PICC for the following reason (s): IV therapy over 48 hours, drug pH or osmolality (causing phlebitis, infiltration in 24 hours) and poor veins/poor circulatory system (CHF, COPD, emphysema, diabetes, steroid use, IV drug abuse, etc.) If you agree, please write an order for the indicated device. For any questions contact the Vascular Access Team at [phone] if no answer, please leave a message.  Thank you for supporting the early vascular access assessment program. 

## 2015-03-29 NOTE — Progress Notes (Signed)
Utilization Review completed. Amzie Sillas RN BSN CM 

## 2015-03-29 NOTE — Progress Notes (Signed)
PT Cancellation Note  Patient Details Name: [name] MRN: [id number] DOB: 25-Mar-1961   Cancelled Treatment:    Reason Eval/Treat Not Completed: Medical issues which prohibited therapy. Pt with B tibia fractures and awaiting ortho consult.  Will hold off on PT evaluation until receive ortho recommendations.   Ashkan Chamberland LUBECK 03/29/2015, 8:55 AM

## 2015-03-29 NOTE — Progress Notes (Signed)
Orthopedic Tech Progress Note Patient Details:  [name] 1961-01-01 [id number] Brace order completed by bio-tech vendor. Patient ID: [name], female   DOB: [birth year], 54 y.o.   MRN: [id number]   Jennye Moccasin 03/29/2015, 6:01 PM

## 2015-03-29 NOTE — Consult Note (Signed)
Reason for Consult:  Bilateral proximal tibia fracture Referring Physician: [facility]  [name] is an 54 y.o. female.  HPI:   54 yo female with multiple medical problems who was even recently hospitalized.  Apparently discharged recently, then sustained a mechanical fall or was even potentially accidentally dropped.  She was admitted last evening severely anemic and with bilateral leg pain.  Xrays show proximal tibia fractures and ortho was consulted.  She reports bilateral leg and foot pain only with leg movement and 8/10 scale.  Past Medical History  Diagnosis Date  . Glaucoma   . Diabetes mellitus without complication   . Hypertension   . Neuropathy   . Crohn disease   . Asthma   . Pulmonary embolus 05/24/2014  . Hypothyroidism   . GERD (gastroesophageal reflux disease)   . HLD (hyperlipidemia)   . C. difficile colitis   . Pancreatitis   . Asthma   . Tobacco abuse     Past Surgical History  Procedure Laterality Date  . Cesarean section    . Esophagogastroduodenoscopy N/A 05/21/2014    Procedure: ESOPHAGOGASTRODUODENOSCOPY (EGD);  Surgeon: Jeryl Columbia, MD;  Location: [facility] ENDOSCOPY;  Service: Endoscopy;  Laterality: N/A;  . Esophagogastroduodenoscopy N/A 03/02/2015    Procedure: ESOPHAGOGASTRODUODENOSCOPY (EGD);  Surgeon: Ronald Lobo, MD;  Location: [facility] ENDOSCOPY;  Service: Endoscopy;  Laterality: N/A;  . Colonoscopy N/A 03/02/2015    Procedure: COLONOSCOPY;  Surgeon: Ronald Lobo, MD;  Location: [facility] ENDOSCOPY;  Service: Endoscopy;  Laterality: N/A;    Family History  Problem Relation Age of Onset  . Breast cancer Mother   . Heart disease Mother 74    CABG  . Diabetes Mother   . Heart disease Father 68    CABG  . Diabetes Father     Social History:  reports that she has been smoking Cigarettes.  She has a 6.5 pack-year smoking history. She has never used smokeless tobacco. She reports that she does not drink alcohol or use illicit drugs.  Allergies:   Allergies  Allergen Reactions  . Iohexol Other (See Comments)    "severe burning" Patient has received Contrast in 2005 with 13 hour pre-medication, and had no reaction at that time  . Spiriva Handihaler [Tiotropium Bromide Monohydrate] Nausea And Vomiting  . Ativan [Lorazepam] Other (See Comments)    hallucinations  . Penicillins Hives    Medications: I have reviewed the patient's current medications.  Results for orders placed or performed during the hospital encounter of 03/28/15 (from the past 48 hour(s))  Heparin level (unfractionated)     Status: Abnormal   Collection Time: 03/28/15 10:30 PM  Result Value Ref Range   Heparin Unfractionated 1.08 (H) 0.30 - 0.70 IU/mL    Comment:        IF HEPARIN RESULTS ARE BELOW EXPECTED VALUES, AND PATIENT DOSAGE HAS BEEN CONFIRMED, SUGGEST FOLLOW UP TESTING OF ANTITHROMBIN III LEVELS.   Basic metabolic panel     Status: Abnormal   Collection Time: 03/28/15 11:34 PM  Result Value Ref Range   Sodium 147 (H) 135 - 145 mmol/L   Potassium 3.3 (L) 3.5 - 5.1 mmol/L   Chloride 115 (H) 101 - 111 mmol/L   CO2 21 (L) 22 - 32 mmol/L   Glucose, Bld 57 (L) 65 - 99 mg/dL   BUN <5 (L) 6 - 20 mg/dL   Creatinine, Ser 0.68 0.44 - 1.00 mg/dL   Calcium 7.0 (L) 8.9 - 10.3 mg/dL   GFR calc non  Af Amer >60 >60 mL/min   GFR calc Af Amer >60 >60 mL/min    Comment: (NOTE) The eGFR has been calculated using the CKD EPI equation. This calculation has not been validated in all clinical situations. eGFR's persistently <60 mL/min signify possible Chronic Kidney Disease.    Anion gap 11 5 - 15  CBC with Differential     Status: Abnormal   Collection Time: 03/28/15 11:34 PM  Result Value Ref Range   WBC 8.9 4.0 - 10.5 K/uL   RBC 1.89 (L) 3.87 - 5.11 MIL/uL   Hemoglobin 5.7 (LL) 12.0 - 15.0 g/dL    Comment: REPEATED TO VERIFY CRITICAL RESULT CALLED TO, READ BACK BY AND VERIFIED WITH: S KELLAM,RN 03/29/15 0005 RHOLMES    HCT 18.0 (L) 36.0 - 46.0 %   MCV  95.2 78.0 - 100.0 fL   MCH 30.2 26.0 - 34.0 pg   MCHC 31.7 30.0 - 36.0 g/dL   RDW 20.2 (H) 11.5 - 15.5 %   Platelets 127 (L) 150 - 400 K/uL   Neutrophils Relative % 59 43 - 77 %   Neutro Abs 5.2 1.7 - 7.7 K/uL   Lymphocytes Relative 33 12 - 46 %   Lymphs Abs 2.9 0.7 - 4.0 K/uL   Monocytes Relative 8 3 - 12 %   Monocytes Absolute 0.7 0.1 - 1.0 K/uL   Eosinophils Relative 1 0 - 5 %   Eosinophils Absolute 0.1 0.0 - 0.7 K/uL   Basophils Relative 1 0 - 1 %   Basophils Absolute 0.1 0.0 - 0.1 K/uL  Type and screen     Status: None (Preliminary result)   Collection Time: 03/28/15 11:34 PM  Result Value Ref Range   ABO/RH(D) B POS    Antibody Screen NEG    Sample Expiration 03/31/2015    Unit Number [id number]    Blood Component Type RBC LR PHER1    Unit division 00    Status of Unit ISSUED    Transfusion Status OK TO TRANSFUSE    Crossmatch Result Compatible    Unit Number [id number]    Blood Component Type RED CELLS,LR    Unit division 00    Status of Unit ISSUED    Transfusion Status OK TO TRANSFUSE    Crossmatch Result Compatible   APTT     Status: None   Collection Time: 03/28/15 11:34 PM  Result Value Ref Range   aPTT 36 24 - 37 seconds  Prepare RBC     Status: None   Collection Time: 03/29/15 12:16 AM  Result Value Ref Range   Order Confirmation ORDER PROCESSED BY BLOOD BANK   Lactic acid, plasma     Status: None   Collection Time: 03/29/15  2:39 AM  Result Value Ref Range   Lactic Acid, Venous 1.2 0.5 - 2.0 mmol/L  Procalcitonin     Status: None   Collection Time: 03/29/15  2:39 AM  Result Value Ref Range   Procalcitonin 4.42 ng/mL    Comment:        Interpretation: PCT > 2 ng/mL: Systemic infection (sepsis) is likely, unless other causes are known. (NOTE)         ICU PCT Algorithm               Non ICU PCT Algorithm    ----------------------------     ------------------------------         PCT < 0.25 ng/mL  PCT < 0.1 ng/mL     Stopping of  antibiotics            Stopping of antibiotics       strongly encouraged.               strongly encouraged.    ----------------------------     ------------------------------       PCT level decrease by               PCT < 0.25 ng/mL       >= 80% from peak PCT       OR PCT 0.25 - 0.5 ng/mL          Stopping of antibiotics                                             encouraged.     Stopping of antibiotics           encouraged.    ----------------------------     ------------------------------       PCT level decrease by              PCT >= 0.25 ng/mL       < 80% from peak PCT        AND PCT >= 0.5 ng/mL            Continuing antibiotics                                               encouraged.       Continuing antibiotics            encouraged.    ----------------------------     ------------------------------     PCT level increase compared          PCT > 0.5 ng/mL         with peak PCT AND          PCT >= 0.5 ng/mL             Escalation of antibiotics                                          strongly encouraged.      Escalation of antibiotics        strongly encouraged.   CBC     Status: Abnormal   Collection Time: 03/29/15  2:46 AM  Result Value Ref Range   WBC 8.5 4.0 - 10.5 K/uL   RBC 1.76 (L) 3.87 - 5.11 MIL/uL   Hemoglobin 5.3 (LL) 12.0 - 15.0 g/dL    Comment: REPEATED TO VERIFY CRITICAL VALUE NOTED.  VALUE IS CONSISTENT WITH PREVIOUSLY REPORTED AND CALLED VALUE.    HCT 16.8 (L) 36.0 - 46.0 %   MCV 95.5 78.0 - 100.0 fL   MCH 30.1 26.0 - 34.0 pg   MCHC 31.5 30.0 - 36.0 g/dL   RDW 20.2 (H) 11.5 - 15.5 %   Platelets 109 (L) 150 - 400 K/uL    Comment: REPEATED TO VERIFY PLATELET COUNT CONFIRMED BY SMEAR CONSISTENT WITH PREVIOUS RESULT   Protime-INR     Status: Abnormal  Collection Time: 03/29/15  2:46 AM  Result Value Ref Range   Prothrombin Time 17.9 (H) 11.6 - 15.2 seconds   INR 1.47 0.00 - 1.49  APTT     Status: Abnormal   Collection Time: 03/29/15  2:46 AM   Result Value Ref Range   aPTT 40 (H) 24 - 37 seconds    Comment:        IF BASELINE aPTT IS ELEVATED, SUGGEST PATIENT RISK ASSESSMENT BE USED TO DETERMINE APPROPRIATE ANTICOAGULANT THERAPY.   HIV antibody     Status: None   Collection Time: 03/29/15  2:46 AM  Result Value Ref Range   HIV Screen 4th Generation wRfx Non Reactive Non Reactive    Comment: (NOTE) Performed At: [facility] [place], [place] [id number] Lindon Romp MD TA:[id number]   Glucose, capillary     Status: None   Collection Time: 03/29/15  7:59 AM  Result Value Ref Range   Glucose-Capillary 74 65 - 99 mg/dL  CBC     Status: Abnormal   Collection Time: 03/29/15 10:36 AM  Result Value Ref Range   WBC 8.7 4.0 - 10.5 K/uL   RBC 3.79 (L) 3.87 - 5.11 MIL/uL   Hemoglobin 11.5 (L) 12.0 - 15.0 g/dL    Comment: REPEATED TO VERIFY POST TRANSFUSION SPECIMEN QUESTIONABLE RESULTS, RECOMMEND RECOLLECT TO VERIFY NOTIFIED ED KNISLEY,RN AT 1103 03/29/15 BY ZBEECH.    HCT 34.4 (L) 36.0 - 46.0 %   MCV 90.8 78.0 - 100.0 fL   MCH 30.3 26.0 - 34.0 pg   MCHC 33.4 30.0 - 36.0 g/dL   RDW 17.7 (H) 11.5 - 15.5 %   Platelets 101 (L) 150 - 400 K/uL    Comment: REPEATED TO VERIFY CONSISTENT WITH PREVIOUS RESULT   Comprehensive metabolic panel     Status: Abnormal   Collection Time: 03/29/15 10:36 AM  Result Value Ref Range   Sodium 146 (H) 135 - 145 mmol/L   Potassium 3.5 3.5 - 5.1 mmol/L   Chloride 117 (H) 101 - 111 mmol/L   CO2 20 (L) 22 - 32 mmol/L   Glucose, Bld 59 (L) 65 - 99 mg/dL   BUN <5 (L) 6 - 20 mg/dL   Creatinine, Ser 0.69 0.44 - 1.00 mg/dL   Calcium 7.2 (L) 8.9 - 10.3 mg/dL   Total Protein 4.7 (L) 6.5 - 8.1 g/dL   Albumin 1.6 (L) 3.5 - 5.0 g/dL   AST 26 15 - 41 U/L   ALT 18 14 - 54 U/L   Alkaline Phosphatase 162 (H) 38 - 126 U/L   Total Bilirubin 1.9 (H) 0.3 - 1.2 mg/dL   GFR calc non Af Amer >60 >60 mL/min   GFR calc Af Amer >60 >60 mL/min    Comment: (NOTE) The eGFR has been  calculated using the CKD EPI equation. This calculation has not been validated in all clinical situations. eGFR's persistently <60 mL/min signify possible Chronic Kidney Disease.    Anion gap 9 5 - 15  Magnesium     Status: Abnormal   Collection Time: 03/29/15 10:36 AM  Result Value Ref Range   Magnesium 1.6 (L) 1.7 - 2.4 mg/dL  Lipid panel     Status: Abnormal   Collection Time: 03/29/15 10:36 AM  Result Value Ref Range   Cholesterol 80 0 - 200 mg/dL   Triglycerides 133 <150 mg/dL   HDL 16 (L) >40 mg/dL   Total CHOL/HDL Ratio 5.0 RATIO   VLDL 27  0 - 40 mg/dL   LDL Cholesterol 37 0 - 99 mg/dL    Comment:        Total Cholesterol/HDL:CHD Risk Coronary Heart Disease Risk Table                     Men   Women  1/2 Average Risk   3.4   3.3  Average Risk       5.0   4.4  2 X Average Risk   9.6   7.1  3 X Average Risk  23.4   11.0        Use the calculated Patient Ratio above and the CHD Risk Table to determine the patient's CHD Risk.        ATP III CLASSIFICATION (LDL):  <100     mg/dL   Optimal  100-129  mg/dL   Near or Above                    Optimal  130-159  mg/dL   Borderline  160-189  mg/dL   High  >190     mg/dL   Very High   CBC     Status: Abnormal   Collection Time: 03/29/15 12:19 PM  Result Value Ref Range   WBC 7.8 4.0 - 10.5 K/uL   RBC 3.61 (L) 3.87 - 5.11 MIL/uL   Hemoglobin 10.7 (L) 12.0 - 15.0 g/dL    Comment: RESULTS VERIFIED VIA RECOLLECT   HCT 32.1 (L) 36.0 - 46.0 %   MCV 88.9 78.0 - 100.0 fL   MCH 29.6 26.0 - 34.0 pg   MCHC 33.3 30.0 - 36.0 g/dL   RDW 17.5 (H) 11.5 - 15.5 %   Platelets 91 (L) 150 - 400 K/uL    Comment: CONSISTENT WITH PREVIOUS RESULT    Ct Abdomen Pelvis Wo Contrast  03/29/2015   CLINICAL DATA:  Anemia, decreased hemoglobin. Evaluate for intra-abdominal bleeding. Patient was on Eliquis.  EXAM: CT ABDOMEN AND PELVIS WITHOUT CONTRAST  TECHNIQUE: Multidetector CT imaging of the abdomen and pelvis was performed following the  standard protocol without IV contrast.  COMPARISON:  Contrast enhanced CT 02/28/2015  FINDINGS: The included lung bases are clear.  Hypodense lesion in the dome of the right lobe of the liver, 2.1 x 1.8 cm, unchanged from prior exam. Mild focal fatty infiltration adjacent with falciform ligament. There is otherwise homogeneous attenuation throughout the hepatic parenchyma. Gallbladder is decompressed. Dilatation of the distal common bile duct measuring 10 mm, not significantly changed. Spleen is small in size with homogeneous attenuation. Pancreatic atrophy without surrounding inflammation. No adrenal nodule. There is mild right renal atrophy, punctate calcifications in the upper and lower right kidney. Punctate calcifications throughout the left kidney, no hydronephrosis.  No retroperitoneal fluid or hemorrhage. The abdominal aorta is normal in caliber with atherosclerosis.  Stomach is decompressed. Limited bowel assessment. No definite dilated or thickened bowel loops. The appendix is normal. Small volume of colonic stool. No free air, free fluid, or intra-abdominal fluid collection.  Increased density in the subcutaneous tissues of the bilateral flank soft tissues.  Urinary bladder is physiologically distended.  No pelvic free fluid.  The bones are under mineralized. Minimal compression deformity superior endplate of H06, unchanged from prior. Bone island in the left iliac bone. No fracture of the bony pelvis or lumbar spine.  IMPRESSION: 1. Increased density in the subcutaneous tissues of both flanks. While this may reflect edema, in the setting of decreased  blood counts, soft tissue hemorrhage is considered. 2. No findings of intra-abdominal hemorrhage or traumatic injury, allowing for lack of contrast.   Electronically Signed   By: Jeb Levering M.D.   On: 03/29/2015 02:25   Dg Ribs Bilateral W/chest  03/29/2015   CLINICAL DATA:  Suspected rib fractures. Bilateral anterior chest and rib pain.  EXAM:  BILATERAL RIBS AND CHEST - 4+ VIEW  COMPARISON:  03/21/2015  FINDINGS: There are fractures through the anterior lateral right fifth through eighth ribs. No pneumothorax. No visible left rib fractures. Heart is normal size. Lungs are clear. No effusions.  IMPRESSION: Right fifth through eighth anterior lateral rib fractures. No pneumothorax.   Electronically Signed   By: Rolm Baptise M.D.   On: 03/29/2015 15:11   Dg Knee Complete 4 Views Left  03/28/2015   CLINICAL DATA:  Left knee pain. Patient reports being dropped by her son. Blood infection.  EXAM: LEFT KNEE - COMPLETE 4+ VIEW  COMPARISON:  None.  FINDINGS: Comminuted fracture of the proximal tibia primarily involving the metaphysis. No definite involvement of the articular surface or joint effusion/ lipohemarthrosis to suspect intra-articular extension. There is a minimally displaced proximal fibular neck fracture. 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  IMPRESSION: 1. Comminuted fracture of the proximal tibial metaphysis. No radiographic findings of intra-articular extension. 2. Mildly displaced fibular neck fracture. 3. 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   Electronically Signed   By: Jeb Levering M.D.   On: 03/28/2015 20:47   Dg Knee Complete 4 Views Right  03/28/2015   CLINICAL DATA:  Right knee pain. Patient reports being dropped by her son. Blood infection.  EXAM: RIGHT KNEE - COMPLETE 4+ VIEW  COMPARISON:  None.  FINDINGS: There is a comminuted mildly displaced fracture of the proximal tibial metaphysis. Proximal fractional involves the posterior medial cortex, distal fracture line the anterior lateral cortex. There is no definite extension to the articular surface. The bones are diffusely under mineralized. Probable osteonecrosis of the medial and lateral femoral condyles. No joint effusion or lipohemarthrosis.  IMPRESSION: 1. Comminuted mildly displaced fracture of the proximal tibial metaphysis. No  definite intra-articular extension. 2. Diffuse bony under mineralization with osteonecrosis of the femoral condyles.   Electronically Signed   By: Jeb Levering M.D.   On: 03/28/2015 20:44   Dg Foot Complete Left  03/28/2015   CLINICAL DATA:  Stated history of knee pain and blood infection. Patient reports being dropped by her son, lower leg swelling.  EXAM: LEFT FOOT - COMPLETE 3+ VIEW  COMPARISON:  None.  FINDINGS: Diffuse bony under mineralization. No fracture or dislocation. The alignment and joint spaces are maintained. No erosions or periosteal reaction. Question bone infarct in the calcaneus and distal tibia. Mild soft tissue edema.  IMPRESSION: Diffuse bony under mineralization and probable bone infarcts in the calcaneus and distal tibia. No acute osseous abnormality is seen.   Electronically Signed   By: Jeb Levering M.D.   On: 03/28/2015 20:39   Dg Foot Complete Right  03/28/2015   CLINICAL DATA:  Stated history of knee pain and blood infection. Patient reports being dropped by her son, lower leg swelling.  EXAM: RIGHT FOOT COMPLETE - 3+ VIEW  COMPARISON:  No prior exams. Please note the lateral foot radiograph is associated with the right knee exam performed concurrently.  FINDINGS: The bones are under mineralized. No fracture or dislocation. The alignment and joint spaces are maintained. Probable bone infarcts involving the  calcaneus and distal tibia. Avascular necrosis suspected of the second metatarsal head. No erosion or periosteal reaction. Mild diffuse soft tissue edema.  IMPRESSION: Diffuse bony under mineralization with probable bone infarcts in the distal tibia and calcaneus. 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 No definite acute bony abnormality.   Electronically Signed   By: Jeb Levering M.D.   On: 03/28/2015 20:42    ROS Blood pressure 124/83, pulse 68, temperature 98.2 F (36.8 C), temperature source Oral, resp. rate 16, height 5' 4"  (1.626 m),  weight 41.504 kg (91 lb 8 oz), last menstrual period 10/01/2010, SpO2 100 %. Physical Exam  Constitutional: She appears cachectic.  HENT:  Head: Normocephalic and atraumatic.  Eyes: Pupils are equal, round, and reactive to light.  Neck: Normal range of motion. Neck supple.  Cardiovascular: Normal rate and regular rhythm.   Respiratory: Effort normal and breath sounds normal.  GI: Soft. Bowel sounds are normal.  Musculoskeletal:       Right lower leg: She exhibits tenderness, bony tenderness and swelling.       Left lower leg: She exhibits tenderness, bony tenderness and swelling.       Right foot: There is swelling.       Left foot: There is swelling.  Neurological: She is alert.  Skin: Skin is warm and dry.  Psychiatric: She has a normal mood and affect.    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  Her bone is very soft/osteopenic and the fracture are essentially non-displaced.  We are going to try non-operative management given her bone quality as well as her other co-morbidities.  There is not an indication for urgent surgery at all.  Will have the Ortho Tech place her in bilateral bledsoe hinged knee braces.Alvera Singh and elevation for swelling.  Will need to remain non-weight bearing on her legs for at least 6-8 weeks and possibly more depending on her healing potential.  Will follow.  [name] Y 03/29/2015, 4:04 PM

## 2015-03-29 NOTE — Progress Notes (Signed)
ADMITTING M.D. NOTIFIED OF PATIENT'S CRITICAL LAB:   HEMOGLOBIN OF 5.7

## 2015-03-30 LAB — TYPE AND SCREEN
ABO/RH(D): B POS
Antibody Screen: NEGATIVE
UNIT DIVISION: 0
Unit division: 0

## 2015-03-30 LAB — CBC
HCT: 31.2 % — ABNORMAL LOW (ref 36.0–46.0)
HCT: 31.4 % — ABNORMAL LOW (ref 36.0–46.0)
HEMATOCRIT: 31.6 % — AB (ref 36.0–46.0)
HEMATOCRIT: 31.6 % — AB (ref 36.0–46.0)
HEMOGLOBIN: 10.4 g/dL — AB (ref 12.0–15.0)
Hemoglobin: 10.8 g/dL — ABNORMAL LOW (ref 12.0–15.0)
Hemoglobin: 10.6 g/dL — ABNORMAL LOW (ref 12.0–15.0)
Hemoglobin: 10.5 g/dL — ABNORMAL LOW (ref 12.0–15.0)
MCH: 31.1 pg (ref 26.0–34.0)
MCH: 29.6 pg (ref 26.0–34.0)
MCH: 29.4 pg (ref 26.0–34.0)
MCH: 29.6 pg (ref 26.0–34.0)
MCHC: 34.6 g/dL (ref 30.0–36.0)
MCHC: 33.5 g/dL (ref 30.0–36.0)
MCHC: 33.4 g/dL (ref 30.0–36.0)
MCHC: 32.9 g/dL (ref 30.0–36.0)
MCV: 88.5 fL (ref 78.0–100.0)
MCV: 89.9 fL (ref 78.0–100.0)
MCV: 88.3 fL (ref 78.0–100.0)
MCV: 89.3 fL (ref 78.0–100.0)
PLATELETS: 97 10*3/uL — AB (ref 150–400)
PLATELETS: 95 10*3/uL — AB (ref 150–400)
PLATELETS: 100 10*3/uL — AB (ref 150–400)
Platelets: 112 10*3/uL — ABNORMAL LOW (ref 150–400)
RBC: 3.47 MIL/uL — AB (ref 3.87–5.11)
RBC: 3.54 MIL/uL — ABNORMAL LOW (ref 3.87–5.11)
RBC: 3.55 MIL/uL — AB (ref 3.87–5.11)
RBC: 3.58 MIL/uL — ABNORMAL LOW (ref 3.87–5.11)
RDW: 18.1 % — ABNORMAL HIGH (ref 11.5–15.5)
RDW: 17.9 % — ABNORMAL HIGH (ref 11.5–15.5)
RDW: 17.9 % — ABNORMAL HIGH (ref 11.5–15.5)
RDW: 17.8 % — ABNORMAL HIGH (ref 11.5–15.5)
WBC: 10.3 10*3/uL (ref 4.0–10.5)
WBC: 10.9 10*3/uL — AB (ref 4.0–10.5)
WBC: 12.2 10*3/uL — AB (ref 4.0–10.5)
WBC: 9.4 10*3/uL (ref 4.0–10.5)

## 2015-03-30 LAB — BASIC METABOLIC PANEL
Anion gap: 9 (ref 5–15)
BUN: <5 mg/dL — ABNORMAL LOW (ref 6–20)
CO2: 25 mmol/L (ref 22–32)
CREATININE: 0.71 mg/dL (ref 0.44–1.00)
Calcium: 7.3 mg/dL — ABNORMAL LOW (ref 8.9–10.3)
Chloride: 112 mmol/L — ABNORMAL HIGH (ref 101–111)
GFR calc Af Amer: >60 mL/min (ref >60)
Glucose, Bld: 200 mg/dL — ABNORMAL HIGH (ref 65–99)
Potassium: 3.3 mmol/L — ABNORMAL LOW (ref 3.5–5.1)
SODIUM: 146 mmol/L — AB (ref 135–145)

## 2015-03-30 LAB — GLUCOSE, CAPILLARY
GLUCOSE-CAPILLARY: 183 mg/dL — AB (ref 65–99)
Glucose-Capillary: 103 mg/dL — ABNORMAL HIGH (ref 65–99)
Glucose-Capillary: 156 mg/dL — ABNORMAL HIGH (ref 65–99)
Glucose-Capillary: 85 mg/dL (ref 65–99)
Glucose-Capillary: 85 mg/dL (ref 65–99)

## 2015-03-30 LAB — TSH: TSH: 1.377 u[IU]/mL (ref 0.350–4.500)

## 2015-03-30 MED ORDER — POTASSIUM CHLORIDE CRYS ER 20 MEQ PO TBCR
40.0000 meq | EXTENDED_RELEASE_TABLET | Freq: Two times a day (BID) | ORAL | Status: AC
  Administered 2015-03-30 (×2): 40 meq via ORAL
  Filled 2015-03-30 (×2): qty 2

## 2015-03-30 MED ORDER — SODIUM CHLORIDE 0.9 % IV BOLUS (SEPSIS)
500.0000 mL | Freq: Once | INTRAVENOUS | Status: AC
  Administered 2015-03-30: 500 mL via INTRAVENOUS

## 2015-03-30 NOTE — Evaluation (Signed)
Physical Therapy Evaluation Patient Details Name: [name] MRN: [id number] DOB: Jan 15, 1961 Today's Date: 03/30/2015   History of Present Illness  54 yo female with multiple medical problems who was even recently hospitalized. Apparently discharged recently, then sustained a mechanical fall or was even potentially accidentally dropped. She was admitted  severely anemic and with bilateral leg pain. Xrays show proximal tibia fractures .  Pt is being treated non-operatively with NWB B LE at least 6-8 weeks.  Clinical Impression  Pt admitted with above diagnosis. Pt currently with functional limitations due to the deficits listed below (see PT Problem List). Pt will benefit from skilled PT to increase their independence and safety with mobility to allow discharge to the venue listed below.  Pt with limited participation today due to pain and also compounded by perseverating on B Bledsoe braces.  Recommend SNF at d/c.  Pt is NOT safe to d/c home.     Follow Up Recommendations SNF;Supervision/Assistance - 24 hour    Equipment Recommendations  None recommended by PT    Recommendations for Other Services       Precautions / Restrictions Precautions Precautions: Fall Precaution Comments: recent 8th R rib fx Required Braces or Orthoses: Knee Immobilizer - Right;Knee Immobilizer - Left (B Bledsoe Braces) Knee Immobilizer - Right: On at all times Knee Immobilizer - Left: On at all times Restrictions Weight Bearing Restrictions: Yes RLE Weight Bearing: Non weight bearing LLE Weight Bearing: Non weight bearing      Mobility  Bed Mobility               General bed mobility comments: Pt initally agreeable to sit EOB and then once started, pt refused stating that she just couldn't and that braces were too tight.  Transfers                    Ambulation/Gait                Stairs            Wheelchair Mobility    Modified Rankin (Stroke Patients Only)        Balance     Sitting balance-Leahy Scale: Zero Sitting balance - Comments: Pt unable to attempt sitting EOB                                     Pertinent Vitals/Pain Pain Assessment: 0-10 Pain Score: 10-Worst pain ever Pain Location: legs Pain Descriptors / Indicators: Aching Pain Intervention(s): Monitored during session;Repositioned;RN gave pain meds during session;Limited activity within patient's tolerance    Home Living Family/patient expects to be discharged to:: Skilled nursing facility                      Prior Function     Gait / Transfers Assistance Needed: Pt said she walked with RW, but fall occured when son was assisting her transfer into bed.  Recent hospital stay and PT highly recommended SNF at that time and pt refused.           Hand Dominance   Dominant Hand: Left    Extremity/Trunk Assessment               Lower Extremity Assessment: RLE deficits/detail;LLE deficits/detail RLE Deficits / Details: decreased ankle df with swelling noted, unable to assess hip abd due to pain LLE Deficits / Details: decreased ankle df with  swelling noted, unable to assess hip abd due to pain     Communication   Communication: No difficulties  Cognition Arousal/Alertness: Awake/alert Behavior During Therapy: Anxious Overall Cognitive Status: No family/caregiver present to determine baseline cognitive functioning Area of Impairment: Problem solving;Safety/judgement;Memory     Memory: Decreased short-term memory   Safety/Judgement: Decreased awareness of deficits     General Comments: Son leaving upon entry. Pt perseverating on B Bledsoe braces.    General Comments General comments (skin integrity, edema, etc.): Pt with multiple complaints of Bledsoe brace being too tight and that is was digging in.  Re-adjusted multiple times and ultimately, top portion was bothering her because piece was too loose and out to the side.  Adjusted  and tightened with wash cloth protecting skin and pt seemed happier.  Pt educated on purpose of Bledsoe braces and need for them.    Exercises General Exercises - Lower Extremity Ankle Circles/Pumps: AAROM;Both;5 reps      Assessment/Plan    PT Assessment Patient needs continued PT services  PT Diagnosis Acute pain;Generalized weakness   PT Problem List Decreased mobility;Decreased balance;Decreased activity tolerance;Decreased safety awareness;Decreased strength  PT Treatment Interventions Functional mobility training;Therapeutic activities;Therapeutic exercise;Balance training;Patient/family education   PT Goals (Current goals can be found in the Care Plan section) Acute Rehab PT Goals Patient Stated Goal: to get braces more comfortable PT Goal Formulation: With patient Time For Goal Achievement: 04/13/15 Potential to Achieve Goals: Fair    Frequency Min 3X/week   Barriers to discharge        Co-evaluation               End of Session Equipment Utilized During Treatment: Left knee immobilizer;Right knee immobilizer Activity Tolerance: Patient limited by pain Patient left: in bed;with bed alarm set;with call bell/phone within reach Nurse Communication: Mobility status         Time: 4098-1191 PT Time Calculation (min) (ACUTE ONLY): 28 min   Charges:   PT Evaluation $Initial PT Evaluation Tier I: 1 Procedure PT Treatments $Therapeutic Activity: 8-22 mins   PT G Codes:        Gaye Scorza LUBECK 03/30/2015, 10:04 AM

## 2015-03-30 NOTE — Progress Notes (Signed)
CSW spoke with pt re: d/c planning.  Pt refusing SNF and plans on going home with family at d/c.  With pt's permission, CSW called number listed for pt's son, but ended up speaking with her daughter, [name].  [name] confirmed that pt would be returning home at d/c and that she would be providing 24 hour care to pt.  [name] verified that pt would be returning to address listed on face sheet and that they are agreeable to [facility] services, as appropriate.  CSW will sign off, as pt refusing placement.  Please re-consult as necessary.

## 2015-03-30 NOTE — Progress Notes (Signed)
Pt HR in 150s-160s. Craige Cotta, NP paged. Awaiting further orders, Will continue to monitor pt.

## 2015-03-30 NOTE — Progress Notes (Signed)
Pt restless, trying to pull of her legs braces/ HR elevated (140-150). MD notified. Per MD order, pt received NS 500cc bolus; EKG was done; VS checked. At this time, pt is resting quietly. Will continue to monitor.

## 2015-03-30 NOTE — Progress Notes (Signed)
Pt CBG 58. Pt is NPO and has no IV access. Craige Cotta, NP paged. NP placed order for Glucagon 1 mg subQ. Follow up CBG 81. PICC line placed and IV fluids D5 1/2 NS restarted. Will continue to monitor pt.

## 2015-03-30 NOTE — Progress Notes (Signed)
[facility] PROGRESS NOTE  [name] CHY:[id number] DOB: 1961/07/02 DOA: 03/28/2015 PCP: Lottie Dawson, MD  Assessment/Plan: is a 54 y.o. female with PMH of hypertension, hyperlipidemia, diet-controlled diabetes mellitus, GERD, hypothyroidism, depression, chronic dizziness, history of PE on Eliquis, pancreatitis, asthma, recently diagnosed C. difficile colitis (C. difficile PCR positive on 03/21/15), who presents with bilateral knee pain and foot pain, and chest wall pain after fall.  1-Bilateral tibia fracture Continue with  Knee immobilizer.  Conservative management per ortho.  Awaiting ortho recommendation for DVT prophylaxis. Will need to take in consideration recent decrease in Hb.  Appreciate Dr Ninfa Linden.   2-Anemia; acute blood loss.; Continue to cycle Hb Hold anticoagulation, eliquis.  Received 2 units PRBC hb at 10. CT Abdomen showed: Increased density in the subcutaneous tissues of both flanks. While this may reflect edema, in the setting of decreased blood counts, soft tissue hemorrhage is considered. Continue to monitor hb. Will consider start DVT prophylaxis in 24 hours.   3-Chest pain:  Rib X ray: Right fifth through eighth anterior lateral rib fractures. No Pneumothorax. Incentive spirometry.   4-History of PE; diagnosed 05-2014. eliquis was discontinue 11-2014. Resume during last admission in June.  Due to acute blood loss and severe anemia, eliquis has been on hold.  When Hb stable to will need DVT prophylaxis. I repeated doppler LE today and are negative for DVT.   C. difficile colitis and sepsis Diagnosed last admission. Never fished treatment. Was not able to afford medication.  Continue with vancomycin  DM-II: Last A1c 4.9 on 05/08/14, well controled. Patient is not taking med at home. - cbc q AM  Hypothyroidism: Last TSH was 0.659 on 02/28/15 -Continue home Synthroid -TSH 1.3  Increase bilirubin, ALK phosphatase; hold Crestor.    Encephalopathy Lethargic.  Carefull with pain medications.  Resolved , more alert today.   Thrombocytopenia; improving.   Hypokalemia; Hypernatremia; Continue with IV fluids.  40 meq times 2.   Code Status: Full Code.  Family Communication: son at bedside.  Disposition Plan: remain inpatient.    Consultants:  Ortho Dr Ninfa Linden.   Procedures:  none  Antibiotics:  Oral vancomycin  HPI/Subjective: More alert today. Complaining of being  cold.  Pain in legs.   Objective: Filed Vitals:   03/30/15 0520  BP: 136/95  Pulse: 103  Temp: 97.5 F (36.4 C)  Resp: 16    Intake/Output Summary (Last 24 hours) at 03/30/15 1317 Last data filed at 03/30/15 1240  Gross per 24 hour  Intake 1077.5 ml  Output      0 ml  Net 1077.5 ml   Filed Weights   03/28/15 1939 03/29/15 0023  Weight: 45.813 kg (101 lb) 41.504 kg (91 lb 8 oz)    Exam:   General:  Lethargic, wake up say few words.   Cardiovascular: S 1, S 2 RRR  Respiratory: CTA  Abdomen: BS present, soft, nt  Musculoskeletal: trace edema, brace in place.   Data Reviewed: Basic Metabolic Panel:  Recent Labs Lab 03/24/15 0428 03/28/15 2334 03/29/15 1036 03/30/15 1200  NA 142 147* 146* 146*  K 4.7 3.3* 3.5 3.3*  CL 113* 115* 117* 112*  CO2 20* 21* 20* 25  GLUCOSE 142* 57* 59* 200*  BUN <5* <5* <5* <5*  CREATININE 0.70 0.68 0.69 0.71  CALCIUM 7.5* 7.0* 7.2* 7.3*  MG  --   --  1.6*  --    Liver Function Tests:  Recent Labs Lab 03/29/15 1036  AST 26  ALT 18  ALKPHOS 162*  BILITOT 1.9*  PROT 4.7*  ALBUMIN 1.6*   No results for input(s): LIPASE, AMYLASE in the last 168 hours. No results for input(s): AMMONIA in the last 168 hours. CBC:  Recent Labs Lab 03/28/15 2334  03/29/15 1036 03/29/15 1219 03/30/15 0047 03/30/15 0525 03/30/15 1200  WBC 8.9  < > 8.7 7.8 12.2* 10.3 10.9*  NEUTROABS 5.2  --   --   --   --   --   --   HGB 5.7*  < > 11.5* 10.7* 10.8* 10.6* 10.5*  HCT 18.0*  < >  34.4* 32.1* 31.2* 31.6* 31.4*  MCV 95.2  < > 90.8 88.9 89.9 88.3 88.5  PLT 127*  < > 101* 91* 97* 100* 112*  < > = values in this interval not displayed. Cardiac Enzymes: No results for input(s): CKTOTAL, CKMB, CKMBINDEX, TROPONINI in the last 168 hours. BNP (last 3 results)  Recent Labs  12/28/14 1905  BNP 77.5    ProBNP (last 3 results)  Recent Labs  07/26/14 1138  PROBNP 440.7*    CBG:  Recent Labs Lab 03/29/15 2058 03/29/15 2134 03/30/15 0030 03/30/15 0416 03/30/15 0754  GLUCAP 58* 81 103* 85 85    Recent Results (from the past 240 hour(s))  Clostridium Difficile by PCR (not at [facility])     Status: Abnormal   Collection Time: 03/21/15  7:23 PM  Result Value Ref Range Status   C difficile by pcr POSITIVE (A) NEGATIVE Final    Comment: CRITICAL RESULT CALLED TO, READ BACK BY AND VERIFIED WITH: B BEAM RN 2054 03/21/15 A BROWNING      Studies: Ct Abdomen Pelvis Wo Contrast  03/29/2015   CLINICAL DATA:  Anemia, decreased hemoglobin. Evaluate for intra-abdominal bleeding. Patient was on Eliquis.  EXAM: CT ABDOMEN AND PELVIS WITHOUT CONTRAST  TECHNIQUE: Multidetector CT imaging of the abdomen and pelvis was performed following the standard protocol without IV contrast.  COMPARISON:  Contrast enhanced CT 02/28/2015  FINDINGS: The included lung bases are clear.  Hypodense lesion in the dome of the right lobe of the liver, 2.1 x 1.8 cm, unchanged from prior exam. Mild focal fatty infiltration adjacent with falciform ligament. There is otherwise homogeneous attenuation throughout the hepatic parenchyma. Gallbladder is decompressed. Dilatation of the distal common bile duct measuring 10 mm, not significantly changed. Spleen is small in size with homogeneous attenuation. Pancreatic atrophy without surrounding inflammation. No adrenal nodule. There is mild right renal atrophy, punctate calcifications in the upper and lower right kidney. Punctate calcifications throughout the left  kidney, no hydronephrosis.  No retroperitoneal fluid or hemorrhage. The abdominal aorta is normal in caliber with atherosclerosis.  Stomach is decompressed. Limited bowel assessment. No definite dilated or thickened bowel loops. The appendix is normal. Small volume of colonic stool. No free air, free fluid, or intra-abdominal fluid collection.  Increased density in the subcutaneous tissues of the bilateral flank soft tissues.  Urinary bladder is physiologically distended.  No pelvic free fluid.  The bones are under mineralized. Minimal compression deformity superior endplate of P50, unchanged from prior. Bone island in the left iliac bone. No fracture of the bony pelvis or lumbar spine.  IMPRESSION: 1. Increased density in the subcutaneous tissues of both flanks. While this may reflect edema, in the setting of decreased blood counts, soft tissue hemorrhage is considered. 2. No findings of intra-abdominal hemorrhage or traumatic injury, allowing for lack of contrast.   Electronically Signed   By: Threasa Beards  Ehinger M.D.   On: 03/29/2015 02:25   Dg Ribs Bilateral W/chest  03/29/2015   CLINICAL DATA:  Suspected rib fractures. Bilateral anterior chest and rib pain.  EXAM: BILATERAL RIBS AND CHEST - 4+ VIEW  COMPARISON:  03/21/2015  FINDINGS: There are fractures through the anterior lateral right fifth through eighth ribs. No pneumothorax. No visible left rib fractures. Heart is normal size. Lungs are clear. No effusions.  IMPRESSION: Right fifth through eighth anterior lateral rib fractures. No pneumothorax.   Electronically Signed   By: Rolm Baptise M.D.   On: 03/29/2015 15:11   Dg Knee Complete 4 Views Left  03/28/2015   CLINICAL DATA:  Left knee pain. Patient reports being dropped by her son. Blood infection.  EXAM: LEFT KNEE - COMPLETE 4+ VIEW  COMPARISON:  None.  FINDINGS: Comminuted fracture of the proximal tibia primarily involving the metaphysis. No definite involvement of the articular surface or joint  effusion/ lipohemarthrosis to suspect intra-articular extension. There is a minimally displaced proximal fibular neck fracture. 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  IMPRESSION: 1. Comminuted fracture of the proximal tibial metaphysis. No radiographic findings of intra-articular extension. 2. Mildly displaced fibular neck fracture. 3. 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   Electronically Signed   By: Jeb Levering M.D.   On: 03/28/2015 20:47   Dg Knee Complete 4 Views Right  03/28/2015   CLINICAL DATA:  Right knee pain. Patient reports being dropped by her son. Blood infection.  EXAM: RIGHT KNEE - COMPLETE 4+ VIEW  COMPARISON:  None.  FINDINGS: There is a comminuted mildly displaced fracture of the proximal tibial metaphysis. Proximal fractional involves the posterior medial cortex, distal fracture line the anterior lateral cortex. There is no definite extension to the articular surface. The bones are diffusely under mineralized. Probable osteonecrosis of the medial and lateral femoral condyles. No joint effusion or lipohemarthrosis.  IMPRESSION: 1. Comminuted mildly displaced fracture of the proximal tibial metaphysis. No definite intra-articular extension. 2. Diffuse bony under mineralization with osteonecrosis of the femoral condyles.   Electronically Signed   By: Jeb Levering M.D.   On: 03/28/2015 20:44   Dg Foot Complete Left  03/28/2015   CLINICAL DATA:  Stated history of knee pain and blood infection. Patient reports being dropped by her son, lower leg swelling.  EXAM: LEFT FOOT - COMPLETE 3+ VIEW  COMPARISON:  None.  FINDINGS: Diffuse bony under mineralization. No fracture or dislocation. The alignment and joint spaces are maintained. No erosions or periosteal reaction. Question bone infarct in the calcaneus and distal tibia. Mild soft tissue edema.  IMPRESSION: Diffuse bony under mineralization and probable bone infarcts in the calcaneus and distal  tibia. No acute osseous abnormality is seen.   Electronically Signed   By: Jeb Levering M.D.   On: 03/28/2015 20:39   Dg Foot Complete Right  03/28/2015   CLINICAL DATA:  Stated history of knee pain and blood infection. Patient reports being dropped by her son, lower leg swelling.  EXAM: RIGHT FOOT COMPLETE - 3+ VIEW  COMPARISON:  No prior exams. Please note the lateral foot radiograph is associated with the right knee exam performed concurrently.  FINDINGS: The bones are under mineralized. No fracture or dislocation. The alignment and joint spaces are maintained. Probable bone infarcts involving the calcaneus and distal tibia. Avascular necrosis suspected of the second metatarsal head. No erosion or periosteal reaction. Mild diffuse soft tissue edema.  IMPRESSION: Diffuse bony under mineralization with probable  bone infarcts in the distal tibia and calcaneus. 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 No definite acute bony abnormality.   Electronically Signed   By: Jeb Levering M.D.   On: 03/28/2015 20:42    Scheduled Meds: . sodium chloride   Intravenous Once  . calcium carbonate  1 tablet Oral Q breakfast  . feeding supplement (RESOURCE BREEZE)  1 Container Oral TID BM  . folic acid  1 mg Oral Daily  . levothyroxine  25 mcg Oral QAC breakfast  . nicotine  21 mg Transdermal Q24H  . pantoprazole  40 mg Oral Daily  . sodium chloride  3 mL Intravenous Q12H  . thiamine  100 mg Oral Daily  . vancomycin  125 mg Oral 4 times per day  . venlafaxine  25 mg Oral BID   Continuous Infusions: . dextrose 5 % and 0.45% NaCl 75 mL/hr at 03/30/15 1238    Principal Problem:   Fracture, tibia Active Problems:   DM2 (diabetes mellitus, type 2)   Anxiety state   Depression   Peripheral neuropathy   Asthma   Normocytic anemia   Nicotine abuse   History of pulmonary embolus (PE)   Chest pain, musculoskeletal   Severe protein-calorie malnutrition   Fall   Enteritis due to  Clostridium difficile   Hypothyroidism   GERD (gastroesophageal reflux disease)   HLD (hyperlipidemia)   Sepsis   Tibial fracture    Time spent: 35 minutes.     Niel Hummer A  [facility] Pager [phone]. If 7PM-7AM, please contact night-coverage at [email or web address], password [facility] 03/30/2015, 1:17 PM  LOS: 2 days

## 2015-03-30 NOTE — Progress Notes (Signed)
OT Cancellation Note  Patient Details Name: [name] MRN: [id number] DOB: 07/23/1961   Cancelled Treatment:    Reason Eval/Treat Not Completed: Other (comment) (OT screened). Pt is Medicare and current D/C plan is SNF. No apparent immediate acute care OT needs, therefore will defer OT to SNF. If OT eval is needed please call Acute Rehab Dept. at [phone] or text page OT at [phone].    Earlie Raveling OTR/L [phone] 03/30/2015, 10:32 AM

## 2015-03-30 NOTE — Progress Notes (Signed)
OT evaluation addendum    03/22/15 1434  OT Time Calculation  OT Start Time (ACUTE ONLY) 1337  OT Stop Time (ACUTE ONLY) 1354  OT Time Calculation (min) 17 min  OT G-codes **NOT FOR INPATIENT CLASS**  Functional Assessment Tool Used clinical judgment  Functional Limitation Self care  Self Care Current Status (L5449) CK  Self Care Goal Status (E0100) CI  OT General Charges  $OT Visit 1 Procedure  OT Evaluation  $Initial OT Evaluation Tier I 1 Procedure    Jenell Milliner, OTR/L [phone]

## 2015-03-30 NOTE — Progress Notes (Addendum)
Hypoglycemic Event  CBG: 58  Treatment: Glucagon IM 1 mg  Symptoms: None  Follow-up CBG: Time:2134 CBG Result: 81  Possible Reasons for Event: Unknown  Comments/MD notified:Kirby, NP    Avis Epley  Remember to initiate Hypoglycemia Order Set & complete

## 2015-03-31 ENCOUNTER — Inpatient Hospital Stay (HOSPITAL_COMMUNITY): Payer: Medicare Other

## 2015-03-31 ENCOUNTER — Encounter (HOSPITAL_COMMUNITY): Payer: Self-pay | Admitting: Radiology

## 2015-03-31 LAB — BASIC METABOLIC PANEL
Anion gap: 6 (ref 5–15)
BUN: <5 mg/dL — ABNORMAL LOW (ref 6–20)
CALCIUM: 6.1 mg/dL — AB (ref 8.9–10.3)
CO2: 22 mmol/L (ref 22–32)
CREATININE: 0.49 mg/dL (ref 0.44–1.00)
Chloride: 112 mmol/L — ABNORMAL HIGH (ref 101–111)
GFR calc Af Amer: >60 mL/min (ref >60)
Glucose, Bld: 324 mg/dL — ABNORMAL HIGH (ref 65–99)
POTASSIUM: 4 mmol/L (ref 3.5–5.1)
SODIUM: 140 mmol/L (ref 135–145)

## 2015-03-31 LAB — GLUCOSE, CAPILLARY
Glucose-Capillary: 173 mg/dL — ABNORMAL HIGH (ref 65–99)
Glucose-Capillary: 172 mg/dL — ABNORMAL HIGH (ref 65–99)
Glucose-Capillary: 144 mg/dL — ABNORMAL HIGH (ref 65–99)
Glucose-Capillary: 166 mg/dL — ABNORMAL HIGH (ref 65–99)
Glucose-Capillary: 170 mg/dL — ABNORMAL HIGH (ref 65–99)
Glucose-Capillary: 235 mg/dL — ABNORMAL HIGH (ref 65–99)

## 2015-03-31 LAB — AMMONIA: Ammonia: 50 umol/L — ABNORMAL HIGH (ref 9–35)

## 2015-03-31 LAB — CBC
HEMATOCRIT: 33.4 % — AB (ref 36.0–46.0)
Hemoglobin: 11.2 g/dL — ABNORMAL LOW (ref 12.0–15.0)
MCH: 30.4 pg (ref 26.0–34.0)
MCHC: 33.5 g/dL (ref 30.0–36.0)
MCV: 90.8 fL (ref 78.0–100.0)
Platelets: 72 10*3/uL — ABNORMAL LOW (ref 150–400)
RBC: 3.68 MIL/uL — ABNORMAL LOW (ref 3.87–5.11)
RDW: 18.4 % — AB (ref 11.5–15.5)
WBC: 10.5 10*3/uL (ref 4.0–10.5)

## 2015-03-31 MED ORDER — LACTULOSE 10 GM/15ML PO SOLN
30.0000 g | Freq: Two times a day (BID) | ORAL | Status: DC
  Administered 2015-03-31 – 2015-04-01 (×2): 30 g via ORAL
  Filled 2015-03-31 (×3): qty 45

## 2015-03-31 MED ORDER — PREDNISONE 50 MG PO TABS
50.0000 mg | ORAL_TABLET | Freq: Four times a day (QID) | ORAL | Status: AC
  Administered 2015-03-31 (×3): 50 mg via ORAL
  Filled 2015-03-31 (×2): qty 1

## 2015-03-31 MED ORDER — SODIUM CHLORIDE 0.9 % IV BOLUS (SEPSIS)
500.0000 mL | Freq: Once | INTRAVENOUS | Status: AC
  Administered 2015-03-31: 500 mL via INTRAVENOUS

## 2015-03-31 MED ORDER — DIPHENHYDRAMINE HCL 50 MG PO CAPS
50.0000 mg | ORAL_CAPSULE | Freq: Once | ORAL | Status: AC
  Administered 2015-03-31: 50 mg via ORAL
  Filled 2015-03-31: qty 1

## 2015-03-31 MED ORDER — IOHEXOL 350 MG/ML SOLN
75.0000 mL | Freq: Once | INTRAVENOUS | Status: AC | PRN
  Administered 2015-03-31: 75 mL via INTRAVENOUS

## 2015-03-31 MED ORDER — METOPROLOL TARTRATE 1 MG/ML IV SOLN
2.5000 mg | Freq: Four times a day (QID) | INTRAVENOUS | Status: DC | PRN
  Administered 2015-04-05: 2.5 mg via INTRAVENOUS
  Filled 2015-03-31: qty 5

## 2015-03-31 NOTE — Progress Notes (Signed)
[facility] PROGRESS NOTE  [name] PFY:[id number] DOB: [birth year] DOA: 03/28/2015 PCP: Lottie Dawson, MD  Assessment/Plan: is a 54 y.o. female with PMH of hypertension, hyperlipidemia, diet-controlled diabetes mellitus, GERD, hypothyroidism, depression, chronic dizziness, history of PE on Eliquis, pancreatitis, asthma, recently diagnosed C. difficile colitis (C. difficile PCR positive on 03/21/15), who presents with bilateral knee pain and foot pain, and chest wall pain after fall.  1-Bilateral tibia fracture Continue with  Knee immobilizer.  Conservative management per ortho.  Awaiting ortho recommendation for DVT prophylaxis. Will need to take in consideration recent decrease in Hb.  Appreciate Dr Ninfa Linden.   2-Anemia; acute blood loss.; Continue to cycle Hb Hold anticoagulation, eliquis.  Received 2 units PRBC hb at 10. CT Abdomen showed: Increased density in the subcutaneous tissues of both flanks. While this may reflect edema, in the setting of decreased blood counts, soft tissue hemorrhage is considered. Continue to monitor hb. If hb stable need to start DVT prophylaxis in 24 hours.   3-Tachycardia: unclear etiology. Will check CT angio rule out due to prior history.  If CT angio positive will need to consider heparin Gtt, and monitor CB very closely, due to recent drop in hb.  -hb stable. Continue with IV fluids.   Encephalopathy;  Patient agitated at times, lethargic today.  Will check CT head, ammonia level.   Chest pain:  Rib X ray: Right fifth through eighth anterior lateral rib fractures. No Pneumothorax. Incentive spirometry.   -History of PE; diagnosed 05-2014. eliquis was discontinue 11-2014. Resume during last admission in June.  Due to acute blood loss and severe anemia, eliquis has been on hold.  When Hb stable to will need DVT prophylaxis. I repeated doppler LE and are negative for DVT.   C. difficile colitis and sepsis Diagnosed last  admission. Never fished treatment. Was not able to afford medication.  Continue with vancomycin  DM-II: Last A1c 4.9 on 05/08/14, well controled. Patient is not taking med at home. - cbc q AM  Hypothyroidism: Last TSH was 0.659 on 02/28/15 -Continue home Synthroid -TSH 1.3  Increase bilirubin, ALK phosphatase; hold Crestor.   Encephalopathy Lethargic.  Carefull with pain medications.  Resolved , more alert today.  Check ammonia level.   Thrombocytopenia; improving.   Hypokalemia; Hypernatremia; Continue with IV fluids.  40 meq times 2.   Code Status: Full Code.  Family Communication: son at bedside.  Disposition Plan: remain inpatient.    Consultants:  Ortho Dr Ninfa Linden.   Procedures:  none  Antibiotics:  Oral vancomycin  HPI/Subjective: Patient sleepy now, received pain medications 2 hours ago.  Per nurse patient was conversant, agitated at times.   Objective: Filed Vitals:   03/31/15 0637  BP: 117/91  Pulse: 114  Temp: 99 F (37.2 C)  Resp: 20    Intake/Output Summary (Last 24 hours) at 03/31/15 1401 Last data filed at 03/31/15 1141  Gross per 24 hour  Intake   2210 ml  Output      0 ml  Net   2210 ml   Filed Weights   03/28/15 1939 03/29/15 0023  Weight: 45.813 kg (101 lb) 41.504 kg (91 lb 8 oz)    Exam:   General:  Lethargic, wake up say few words.   Cardiovascular: S 1, S 2 RRR  Respiratory: CTA  Abdomen: BS present, soft, nt  Musculoskeletal: trace edema, brace in place.   Data Reviewed: Basic Metabolic Panel:  Recent Labs Lab 03/28/15 2334 03/29/15 1036 03/30/15 1200  03/31/15 0535  NA 147* 146* 146* 140  K 3.3* 3.5 3.3* 4.0  CL 115* 117* 112* 112*  CO2 21* 20* 25 22  GLUCOSE 57* 59* 200* 324*  BUN <5* <5* <5* <5*  CREATININE 0.68 0.69 0.71 0.49  CALCIUM 7.0* 7.2* 7.3* 6.1*  MG  --  1.6*  --   --    Liver Function Tests:  Recent Labs Lab 03/29/15 1036  AST 26  ALT 18  ALKPHOS 162*  BILITOT 1.9*  PROT 4.7*   ALBUMIN 1.6*   No results for input(s): LIPASE, AMYLASE in the last 168 hours. No results for input(s): AMMONIA in the last 168 hours. CBC:  Recent Labs Lab 03/28/15 2334  03/30/15 0047 03/30/15 0525 03/30/15 1200 03/30/15 1750 03/31/15 0535  WBC 8.9  < > 12.2* 10.3 10.9* 9.4 10.5  NEUTROABS 5.2  --   --   --   --   --   --   HGB 5.7*  < > 10.8* 10.6* 10.5* 10.4* 11.2*  HCT 18.0*  < > 31.2* 31.6* 31.4* 31.6* 33.4*  MCV 95.2  < > 89.9 88.3 88.5 89.3 90.8  PLT 127*  < > 97* 100* 112* 95* 72*  < > = values in this interval not displayed. Cardiac Enzymes: No results for input(s): CKTOTAL, CKMB, CKMBINDEX, TROPONINI in the last 168 hours. BNP (last 3 results)  Recent Labs  12/28/14 1905  BNP 77.5    ProBNP (last 3 results)  Recent Labs  07/26/14 1138  PROBNP 440.7*    CBG:  Recent Labs Lab 03/30/15 2032 03/31/15 0049 03/31/15 0452 03/31/15 0742 03/31/15 1239  GLUCAP 156* 173* 172* 144* 166*    Recent Results (from the past 240 hour(s))  Clostridium Difficile by PCR (not at [facility])     Status: Abnormal   Collection Time: 03/21/15  7:23 PM  Result Value Ref Range Status   C difficile by pcr POSITIVE (A) NEGATIVE Final    Comment: CRITICAL RESULT CALLED TO, READ BACK BY AND VERIFIED WITH: B BEAM RN 2054 03/21/15 A BROWNING   Culture, blood (routine x 2)     Status: None (Preliminary result)   Collection Time: 03/29/15  2:39 AM  Result Value Ref Range Status   Specimen Description BLOOD LEFT ARM  Final   Special Requests IN PEDIATRIC BOTTLE 1CC  Final   Culture NO GROWTH 1 DAY  Final   Report Status PENDING  Incomplete  Culture, blood (routine x 2)     Status: None (Preliminary result)   Collection Time: 03/29/15  2:46 AM  Result Value Ref Range Status   Specimen Description BLOOD LEFT ARM  Final   Special Requests IN PEDIATRIC BOTTLE 1CC  Final   Culture NO GROWTH 1 DAY  Final   Report Status PENDING  Incomplete     Studies: Dg Ribs Bilateral  W/chest  03/29/2015   CLINICAL DATA:  Suspected rib fractures. Bilateral anterior chest and rib pain.  EXAM: BILATERAL RIBS AND CHEST - 4+ VIEW  COMPARISON:  03/21/2015  FINDINGS: There are fractures through the anterior lateral right fifth through eighth ribs. No pneumothorax. No visible left rib fractures. Heart is normal size. Lungs are clear. No effusions.  IMPRESSION: Right fifth through eighth anterior lateral rib fractures. No pneumothorax.   Electronically Signed   By: Rolm Baptise M.D.   On: 03/29/2015 15:11    Scheduled Meds: . sodium chloride   Intravenous Once  . calcium carbonate  1 tablet  Oral Q breakfast  . diphenhydrAMINE  50 mg Oral Once  . feeding supplement (RESOURCE BREEZE)  1 Container Oral TID BM  . folic acid  1 mg Oral Daily  . levothyroxine  25 mcg Oral QAC breakfast  . nicotine  21 mg Transdermal Q24H  . pantoprazole  40 mg Oral Daily  . predniSONE  50 mg Oral Q6H  . sodium chloride  3 mL Intravenous Q12H  . thiamine  100 mg Oral Daily  . vancomycin  125 mg Oral 4 times per day  . venlafaxine  25 mg Oral BID   Continuous Infusions: . dextrose 5 % and 0.45% NaCl 100 mL/hr (03/31/15 1215)    Principal Problem:   Fracture, tibia Active Problems:   DM2 (diabetes mellitus, type 2)   Anxiety state   Depression   Peripheral neuropathy   Asthma   Normocytic anemia   Nicotine abuse   History of pulmonary embolus (PE)   Chest pain, musculoskeletal   Severe protein-calorie malnutrition   Fall   Enteritis due to Clostridium difficile   Hypothyroidism   GERD (gastroesophageal reflux disease)   HLD (hyperlipidemia)   Sepsis   Tibial fracture    Time spent: 35 minutes.     Niel Hummer A  [facility] Pager [phone]. If 7PM-7AM, please contact night-coverage at [email or web address], password [facility] 03/31/2015, 2:01 PM  LOS: 3 days

## 2015-03-31 NOTE — Progress Notes (Signed)
Attempt to assess patient for home health needs, as reported that patient had refused SNF. Patient unable to answer questions. Asked patient how she was going to get to the bathroom, she replied,"Sure." Asked patient who was going to help her, she replied, "Sure." Pt could not name where she was, the president, the day, but could state her name. Unable to assess home health resources in use, or company providing at this time from the patient. Will attempt to contact family today.

## 2015-03-31 NOTE — Progress Notes (Addendum)
Pt received 1 percocet at 0930 and is finally asleep. Pt had been crying, yelling in pain esp with any kind of mvmt. Pt is arousable but in deep sleep at present.  Discussed with Dr Carmell Austria about  pt's hr 120's-when calm/resting, up to 170's with mvmt and pain.  Will cont to monitor.

## 2015-04-01 DIAGNOSIS — E039 Hypothyroidism, unspecified: Secondary | ICD-10-CM

## 2015-04-01 DIAGNOSIS — S82209A Unspecified fracture of shaft of unspecified tibia, initial encounter for closed fracture: Secondary | ICD-10-CM

## 2015-04-01 DIAGNOSIS — W19XXXD Unspecified fall, subsequent encounter: Secondary | ICD-10-CM

## 2015-04-01 DIAGNOSIS — E785 Hyperlipidemia, unspecified: Secondary | ICD-10-CM

## 2015-04-01 DIAGNOSIS — Z86711 Personal history of pulmonary embolism: Secondary | ICD-10-CM

## 2015-04-01 DIAGNOSIS — A047 Enterocolitis due to Clostridium difficile: Secondary | ICD-10-CM

## 2015-04-01 LAB — CBC
HEMATOCRIT: 32.2 % — AB (ref 36.0–46.0)
Hemoglobin: 10.8 g/dL — ABNORMAL LOW (ref 12.0–15.0)
MCH: 30.3 pg (ref 26.0–34.0)
MCHC: 33.5 g/dL (ref 30.0–36.0)
MCV: 90.2 fL (ref 78.0–100.0)
Platelets: 48 10*3/uL — ABNORMAL LOW (ref 150–400)
RBC: 3.57 MIL/uL — AB (ref 3.87–5.11)
RDW: 18.4 % — ABNORMAL HIGH (ref 11.5–15.5)
WBC: 5.9 10*3/uL (ref 4.0–10.5)

## 2015-04-01 LAB — GLUCOSE, CAPILLARY
GLUCOSE-CAPILLARY: 334 mg/dL — AB (ref 65–99)
GLUCOSE-CAPILLARY: 99 mg/dL (ref 65–99)
Glucose-Capillary: 114 mg/dL — ABNORMAL HIGH (ref 65–99)
Glucose-Capillary: 185 mg/dL — ABNORMAL HIGH (ref 65–99)
Glucose-Capillary: 255 mg/dL — ABNORMAL HIGH (ref 65–99)
Glucose-Capillary: 77 mg/dL (ref 65–99)

## 2015-04-01 LAB — BASIC METABOLIC PANEL
Anion gap: 5 (ref 5–15)
BUN: 10 mg/dL (ref 6–20)
CO2: 20 mmol/L — ABNORMAL LOW (ref 22–32)
Calcium: 7.1 mg/dL — ABNORMAL LOW (ref 8.9–10.3)
Chloride: 116 mmol/L — ABNORMAL HIGH (ref 101–111)
Creatinine, Ser: 0.59 mg/dL (ref 0.44–1.00)
GFR calc Af Amer: >60 mL/min (ref >60)
GFR calc non Af Amer: >60 mL/min (ref >60)
Glucose, Bld: 279 mg/dL — ABNORMAL HIGH (ref 65–99)
Potassium: 3.4 mmol/L — ABNORMAL LOW (ref 3.5–5.1)
Sodium: 141 mmol/L (ref 135–145)

## 2015-04-01 MED ORDER — INSULIN ASPART 100 UNIT/ML ~~LOC~~ SOLN
0.0000 [IU] | Freq: Three times a day (TID) | SUBCUTANEOUS | Status: DC
  Administered 2015-04-01: 2 [IU] via SUBCUTANEOUS

## 2015-04-01 MED ORDER — LACTULOSE 10 GM/15ML PO SOLN
10.0000 g | Freq: Two times a day (BID) | ORAL | Status: DC
  Administered 2015-04-01 – 2015-04-02 (×2): 10 g via ORAL
  Filled 2015-04-01 (×3): qty 15

## 2015-04-01 MED ORDER — SODIUM CHLORIDE 0.45 % IV SOLN
INTRAVENOUS | Status: DC
  Administered 2015-04-01 – 2015-04-02 (×3): via INTRAVENOUS

## 2015-04-01 MED ORDER — SACCHAROMYCES BOULARDII 250 MG PO CAPS
250.0000 mg | ORAL_CAPSULE | Freq: Two times a day (BID) | ORAL | Status: DC
  Administered 2015-04-01 – 2015-04-05 (×9): 250 mg via ORAL
  Filled 2015-04-01 (×12): qty 1

## 2015-04-01 MED ORDER — INSULIN ASPART 100 UNIT/ML ~~LOC~~ SOLN: 0.0000 [IU] | Freq: Every day | SUBCUTANEOUS | Status: DC

## 2015-04-01 MED ORDER — INSULIN ASPART 100 UNIT/ML ~~LOC~~ SOLN
3.0000 [IU] | Freq: Once | SUBCUTANEOUS | Status: AC
  Administered 2015-04-01: 3 [IU] via SUBCUTANEOUS

## 2015-04-01 NOTE — Progress Notes (Signed)
[facility] PROGRESS NOTE  [name] YIF:[id number] DOB: 07-Jan-1961 DOA: 03/28/2015 PCP: Lottie Dawson, MD  Assessment/Plan: is a 54 y.o. female with PMH of hypertension, hyperlipidemia, diet-controlled diabetes mellitus, GERD, hypothyroidism, depression, chronic dizziness, history of PE on Eliquis, pancreatitis, asthma, recently diagnosed C. difficile colitis (C. difficile PCR positive on 03/21/15), who presents with bilateral knee pain and foot pain, and chest wall pain after fall. Will need SNF  Bilateral tibia fracture Continue with  Knee immobilizer.  Conservative management per ortho.  Awaiting ortho recommendation for DVT prophylaxis. Will need to take in consideration recent decrease in Hb.  Appreciate Dr Ninfa Linden assistance.   Anemia; acute blood loss.; Continue to cycle Hb Hold anticoagulation, eliquis.  Received 2 units PRBC hb at 10 and stable. CT Abdomen showed: Increased density in the subcutaneous tissues of both flanks. While this may reflect edema, in the setting of decreased blood counts, soft tissue hemorrhage is considered. Continue to monitor hb. If hb stable need to start DVT prophylaxis in 24-48 hours.   Tachycardia: unclear etiology; but appears to be secondary to dehydration and anemia -neg PE on CTA -hb stable. Continue with IV fluids.   Encephalopathy;  Patient agitated at times, lethargic/sleepy and oriented X 1 today  B12 and TSH WNL Ammonia slightly elevated  Chest pain:  Rib X ray: Right fifth through eighth anterior lateral rib fractures.  No Pneumothorax. Incentive spirometry and PRN analgesics  -History of PE; diagnosed 05-2014. eliquis was discontinue 11-2014. Resume during last admission in June.  -Due to acute blood loss and severe anemia, eliquis has been on hold. Will resume in 24-48 depending on Hgb trend   When Hb stable will need DVT prophylaxis. I repeated doppler LE and are negative for DVT.   C. difficile  colitis and sepsis Diagnosed last admission. Never fished treatment. Was not able to afford medication.  Continue with oral vancomycin  DM-II: Last A1c 4.9 on 05/08/14, well controled. Patient is not taking med at home. -will monitor -Slight hyperglycemia while on D5 -A1C pending  Hypothyroidism:  -Continue home Synthroid -TSH 1.3  Increase bilirubin, ALK phosphatase Will hold statins for now Associated with bone fracture as well  Encephalopathy Lethargic. Will be Carefull and judicious with pain medications.  ammonia level normal   Thrombocytopenia; improving.   Hypokalemia and Hypernatremia;  Continue with IV fluids.  Will replete potassium as needed Follow BMET in am   Code Status: Full Code.  Family Communication: son at bedside.  Disposition Plan: remain inpatient.    Consultants:  Ortho Dr Ninfa Linden.   Procedures:  none  Antibiotics:  Oral vancomycin  HPI/Subjective: Patient sleepy/lethargic but arousable; oriented X1 and with intermittent episodes of agitation according to nursing staff. Patient is afebrile and denies CP.Marland Kitchen   Objective: Filed Vitals:   04/01/15 1336  BP: 139/88  Pulse: 99  Temp: 98.6 F (37 C)  Resp:     Intake/Output Summary (Last 24 hours) at 04/01/15 1922 Last data filed at 04/01/15 1834  Gross per 24 hour  Intake 1666.67 ml  Output      0 ml  Net 1666.67 ml   Filed Weights   03/28/15 1939 03/29/15 0023  Weight: 45.813 kg (101 lb) 41.504 kg (91 lb 8 oz)    Exam:   General:  Lethargic but arousable; oriented X1; no fever. Still with diarrhea.   Cardiovascular: S1, S2 RRR; no JVD  Respiratory: CTA bilaterally  Abdomen: BS present, soft, nt, no guarding  Musculoskeletal: trace  edema, brace in place; decrease range of motion   Data Reviewed: Basic Metabolic Panel:  Recent Labs Lab 03/28/15 2334 03/29/15 1036 03/30/15 1200 03/31/15 0535 04/01/15 0535  NA 147* 146* 146* 140 141  K 3.3* 3.5 3.3* 4.0 3.4*   CL 115* 117* 112* 112* 116*  CO2 21* 20* 25 22 20*  GLUCOSE 57* 59* 200* 324* 279*  BUN <5* <5* <5* <5* 10  CREATININE 0.68 0.69 0.71 0.49 0.59  CALCIUM 7.0* 7.2* 7.3* 6.1* 7.1*  MG  --  1.6*  --   --   --    Liver Function Tests:  Recent Labs Lab 03/29/15 1036  AST 26  ALT 18  ALKPHOS 162*  BILITOT 1.9*  PROT 4.7*  ALBUMIN 1.6*    Recent Labs Lab 03/31/15 1546  AMMONIA 50*   CBC:  Recent Labs Lab 03/28/15 2334  03/30/15 0525 03/30/15 1200 03/30/15 1750 03/31/15 0535 04/01/15 0535  WBC 8.9  < > 10.3 10.9* 9.4 10.5 5.9  NEUTROABS 5.2  --   --   --   --   --   --   HGB 5.7*  < > 10.6* 10.5* 10.4* 11.2* 10.8*  HCT 18.0*  < > 31.6* 31.4* 31.6* 33.4* 32.2*  MCV 95.2  < > 88.3 88.5 89.3 90.8 90.2  PLT 127*  < > 100* 112* 95* 72* 48*  < > = values in this interval not displayed.  BNP (last 3 results)  Recent Labs  12/28/14 1905  BNP 77.5    ProBNP (last 3 results)  Recent Labs  07/26/14 1138  PROBNP 440.7*    CBG:  Recent Labs Lab 04/01/15 0053 04/01/15 0429 04/01/15 0833 04/01/15 1204 04/01/15 1650  GLUCAP 255* 334* 185* 77 114*    Recent Results (from the past 240 hour(s))  Culture, blood (routine x 2)     Status: None (Preliminary result)   Collection Time: 03/29/15  2:39 AM  Result Value Ref Range Status   Specimen Description BLOOD LEFT ARM  Final   Special Requests IN PEDIATRIC BOTTLE 1CC  Final   Culture NO GROWTH 3 DAYS  Final   Report Status PENDING  Incomplete  Culture, blood (routine x 2)     Status: None (Preliminary result)   Collection Time: 03/29/15  2:46 AM  Result Value Ref Range Status   Specimen Description BLOOD LEFT ARM  Final   Special Requests IN PEDIATRIC BOTTLE 1CC  Final   Culture NO GROWTH 3 DAYS  Final   Report Status PENDING  Incomplete     Studies: Ct Head Wo Contrast  03/31/2015   CLINICAL DATA:  Confusion.  EXAM: CT HEAD WITHOUT CONTRAST  TECHNIQUE: Contiguous axial images were obtained from the base  of the skull through the vertex without intravenous contrast.  COMPARISON:  02/28/2015  FINDINGS: Since the prior head CT, the patient has developed areas of extensive hypoattenuation symmetrically throughout the subcortical and deep cerebral white matter bilaterally. There is no significant associated mass effect. No definite underlying mass is identified. No acute intracranial hemorrhage, midline shift, or extra-axial fluid collection is seen. Patchy hypodensities are present in the cerebellum as well. There is no definite evidence of acute large territory cortical infarct.  Orbits are unremarkable.  IMPRESSION: Extensive areas of hypodensity throughout the cerebral white matter and patchy involvement of the cerebellum, new from the recent prior CT. No significant mass effect. Brain MRI (without and with IV contrast) is recommended for further evaluation, with possible  considerations including encephalitis, fulminant demyelination, and toxic and metabolic leukoencephalopathies.  These results will be called to the ordering clinician or representative by the Radiologist Assistant, and communication documented in the PACS or zVision Dashboard.   Electronically Signed   By: Logan Bores   On: 03/31/2015 21:48   Ct Angio Chest Pe W/cm &/or Wo Cm  03/31/2015   CLINICAL DATA:  History of previous pulmonary embolus, tachycardia  EXAM: CT ANGIOGRAPHY CHEST WITH CONTRAST  TECHNIQUE: Multidetector CT imaging of the chest was performed using the standard protocol during bolus administration of intravenous contrast. Multiplanar CT image reconstructions and MIPs were obtained to evaluate the vascular anatomy.  CONTRAST:  72m OMNIPAQUE IOHEXOL 350 MG/ML SOLN  COMPARISON:  02/28/2015, 05/08/2014  FINDINGS: The lungs are well aerated bilaterally. A mild amount of left basilar atelectasis with associated small effusion is seen. The right lung is clear. A right-sided PICC line is noted. The thoracic inlet is within normal  limits. The thoracic aorta and its branches are unremarkable with the exception of a bovine arch anomaly. Heavy coronary calcifications are seen.  Pulmonary artery shows a normal branching pattern. No filling defects to suggest pulmonary emboli are identified. The previously seen emboli have resolved the interval. No significant hilar or mediastinal adenopathy is noted.  Scanning into the upper abdomen demonstrates mild ascites surrounding the liver. No other definitive abnormality is seen. The osseous structures show evidence of prior rib fractures of the right sixth rib posterior laterally. No left-sided rib fractures are seen.  Review of the MIP images confirms the above findings.  IMPRESSION: No evidence of pulmonary emboli.  Right sixth rib fracture this is new from the recent exam from 1 month ago.  Mild left basilar atelectasis and small effusion.  Mild ascites.   Electronically Signed   By: MInez CatalinaM.D.   On: 03/31/2015 21:18    Scheduled Meds: . sodium chloride   Intravenous Once  . calcium carbonate  1 tablet Oral Q breakfast  . feeding supplement (RESOURCE BREEZE)  1 Container Oral TID BM  . folic acid  1 mg Oral Daily  . insulin aspart  0-5 Units Subcutaneous QHS  . insulin aspart  0-9 Units Subcutaneous TID WC  . lactulose  10 g Oral BID  . levothyroxine  25 mcg Oral QAC breakfast  . nicotine  21 mg Transdermal Q24H  . pantoprazole  40 mg Oral Daily  . saccharomyces boulardii  250 mg Oral BID  . sodium chloride  3 mL Intravenous Q12H  . thiamine  100 mg Oral Daily  . vancomycin  125 mg Oral 4 times per day  . venlafaxine  25 mg Oral BID   Continuous Infusions: . sodium chloride 100 mL/hr at 04/01/15 1434    Principal Problem:   Fracture, tibia Active Problems:   DM2 (diabetes mellitus, type 2)   Anxiety state   Depression   Peripheral neuropathy   Asthma   Normocytic anemia   Nicotine abuse   History of pulmonary embolus (PE)   Chest pain, musculoskeletal    Severe protein-calorie malnutrition   Fall   Enteritis due to Clostridium difficile   Hypothyroidism   GERD (gastroesophageal reflux disease)   HLD (hyperlipidemia)   Sepsis   Tibial fracture    Time spent: 30 minutes.     MBarton Dubois [facility] Pager [phone] If 7PM-7AM, please contact night-coverage at [email or web address], password [facility]7/10/2014, 7:22 PM  LOS: 4 days

## 2015-04-01 NOTE — Progress Notes (Signed)
NP Craige Cotta notified concerning patient's cbg- awaiting any further orders.

## 2015-04-01 NOTE — Care Management (Signed)
Important Message  Patient Details  Name: [name] MRN: [id number] Date of Birth: 1960/11/07   Medicare Important Message Given:  Yes-second notification given    Lawerance Sabal, RN 04/01/2015, 7:55 AM

## 2015-04-01 NOTE — Progress Notes (Signed)
Inpatient Diabetes Program Recommendations  AACE/ADA: New Consensus Statement on Inpatient Glycemic Control (2013)  Target Ranges:  Prepandial:   less than 140 mg/dL      Peak postprandial:   less than 180 mg/dL (1-2 hours)      Critically ill patients:  140 - 180 mg/dL   Review of Glycemic Control:  Results for LUVINIA, LUCY (MRN [id number]) as of 04/01/2015 09:51  Ref. Range 03/31/2015 17:18 03/31/2015 22:20 04/01/2015 00:53 04/01/2015 04:29 04/01/2015 08:33  Glucose-Capillary Latest Ref Range: 65-99 mg/dL 409 (H) 811 (H) 914 (H) 334 (H) 185 (H)   Note CBG's increased yesterday after receiving Prednisone.  CBG's better this morning.  May consider CHO modified diet while patient is on steroids.  Thanks, Beryl Meager, RN, BC-ADM Inpatient Diabetes Coordinator Pager [phone] (8a-5p)

## 2015-04-01 NOTE — Clinical Social Work Note (Signed)
Bed offers given.  Roddie Mc MSW, [place], [place], [phone]

## 2015-04-01 NOTE — Progress Notes (Signed)
Spoke with patient's son [name] on the phone. CM asked what he thought was the best place for his mom to go onve she does not need to be in the hospital anymore. Mr. Wynonia Hazard stated, "a rehab facility, but I want to be able to chose it." CM stated that SNF preference would be passed onto [place] [facility], and he would be in contact to discuss what facilities agree to accept her and he would be able to choose from there. Mr. Wynonia Hazard said that he would be here between 2:00 and 3:00. CM instructed him to let the staff know when he arrives and CM and SW would be happy to discuss discharge plan. Beverely Pace SW updated.

## 2015-04-01 NOTE — Progress Notes (Signed)
Ross from [employer] came to fit patient for knee immobilizers. Pt. Refused to have them place. Nurse and sitter at bedside to confirm.

## 2015-04-01 NOTE — Progress Notes (Signed)
Utilization Review completed. Marshaun Lortie RN BSN CM 

## 2015-04-01 NOTE — Progress Notes (Signed)
PT Cancellation Note  Patient Details Name: [name] MRN: [id number] DOB: 11-09-1960   Cancelled Treatment:    Reason Eval/Treat Not Completed: Other (comment) (No braces on LE's) Patient without braces on LE's.  RN attempting to get replaced.  Will return at later time once braces in place.  [name] 04/01/2015, 4:14 PM Durenda Hurt. Renaldo Fiddler, [facility] Acute Rehab Services Pager [phone]

## 2015-04-01 NOTE — Progress Notes (Addendum)
CBG elevated. Reviewed chart. Last HbA1c on chart 4.9%. Sugars likely high here due to steroids and pt is on D51/2 NS. Recheck HbA1c. Change IVF to 1/2 NS only (was hypernatremic, now 140). Check CBG tid ac and hs with SSI coverage. BMP this am.  Jimmye Norman, NP [facility]

## 2015-04-01 NOTE — Discharge Instructions (Signed)
No weight on either leg at all for the next 6-8 weeks.

## 2015-04-01 NOTE — Progress Notes (Signed)
Initial Nutrition Assessment  DOCUMENTATION CODES:  Underweight, Severe malnutrition in context of chronic illness  INTERVENTION:  Resource Breeze po TID, each supplement provides 250 kcal and 9 grams of protein  NUTRITION DIAGNOSIS:  Malnutrition related to chronic illness as evidenced by severe depletion of body fat, severe depletion of muscle mass.   GOAL:  Patient will meet greater than or equal to 90% of their needs   MONITOR:  PO intake, Supplement acceptance, Diet advancement, Skin, Labs, Weight trends, I & O's  REASON FOR ASSESSMENT:  Malnutrition Screening Tool    ASSESSMENT: [name] is a 54 y.o. female with PMH of hypertension, hyperlipidemia, diet-controlled diabetes mellitus, GERD, hypothyroidism, depression, chronic dizziness, history of PE on Eliquis, pancreatitis, asthma, recently diagnosed C. difficile colitis (C. difficile PCR positive on 03/21/15), who presents with bilateral knee pain and foot pain, and chest wall pain after fall  Pt admitted with bilateral tibia fracture s/p fall. Per MD orders, pt will need knee immobilizer, however, RN reports pt is refusing. Plan is for conservative management at this time.   Pt is well-known to Clinical Nutrition Team due to prior admissions. Pt has a hx of malnutrition, which is ongoing. Unable to perform nutrition-focused physical exam at this time. However, exam performed on 03/22/15 revealed severe fat and muscle depletion; RD suspects no changes in exam at this time.    Pt sleeping soundly at time of visit with sitter at bedside. Noted lunch tray untouched. Pt refused lunch also. Pt has Resource Breeze supplements; RD will continue with order.   Labs reviewed. K: 3.4.   Height:  Ht Readings from Last 1 Encounters:  03/29/15  (1.626 m)    Weight:  Wt Readings from Last 1 Encounters:  03/29/15 91 lb 8 oz (41.504 kg)    Ideal Body Weight:  54.5 kg  Wt Readings from Last 10 Encounters:   03/29/15 91 lb 8 oz (41.504 kg)  03/24/15 94 lb 9.2 oz (42.9 kg)  03/04/15 102 lb 4.7 oz (46.4 kg)  02/02/15 76 lb (34.473 kg)  01/03/15 93 lb 14.7 oz (42.6 kg)  08/02/14 95 lb 8 oz (43.319 kg)  05/19/14 104 lb 4.8 oz (47.31 kg)  05/12/14 85 lb 15.7 oz (39 kg)  10/05/13 92 lb (41.731 kg)  10/20/12 102 lb 15.3 oz (46.7 kg)    BMI:  Body mass index is 15.7 kg/(m^2).  Estimated Nutritional Needs:  Kcal:  1450-1650  Protein:  65-75grams  Fluid:  1.5-1.7 L  Skin:  Reviewed, no issues (redness on buttocks)  Diet Order:  Diet Carb Modified Fluid consistency:: Thin; Room service appropriate?: Yes  EDUCATION NEEDS:  No education needs identified at this time   Intake/Output Summary (Last 24 hours) at 04/01/15 1445 Last data filed at 04/01/15 1426  Gross per 24 hour  Intake 1540.42 ml  Output      0 ml  Net 1540.42 ml    Last BM:  04/01/15  Priti Consoli A. Mayford Knife, RD, LDN, CDE Pager: [phone] After hours Pager: [phone]

## 2015-04-01 NOTE — Consult Note (Signed)
   [facility] CM Inpatient Consult   04/01/2015  [name] Feb 02, 1961 [id number]   Received call from [facility] Licensed CSW stating patient is active with [facility] for community resources. Went to bedside to speak with patient. However, she is confused. Spoke with inpatient RNCM about patient's disposition. Called primary contact person in EPIC,patient's son, [facility] [name] at [phone] to discuss discharge plans. He reports that he is agreeable to SNF placement for patient. Inpatient RNCM also spoke with patient's son to confirm disposition plans. Spoke with inpatient Licensed CSW to make aware of conversation with son as well for SNF placement. Will continue to follow. Appreciative of collaboration of inpatient team.  Raiford Noble, MSN-Ed, RN,BSN [facility] Liaison [phone]

## 2015-04-02 DIAGNOSIS — E1143 Type 2 diabetes mellitus with diabetic autonomic (poly)neuropathy: Secondary | ICD-10-CM

## 2015-04-02 DIAGNOSIS — S82209S Unspecified fracture of shaft of unspecified tibia, sequela: Secondary | ICD-10-CM

## 2015-04-02 DIAGNOSIS — F411 Generalized anxiety disorder: Secondary | ICD-10-CM

## 2015-04-02 DIAGNOSIS — K219 Gastro-esophageal reflux disease without esophagitis: Secondary | ICD-10-CM

## 2015-04-02 DIAGNOSIS — E43 Unspecified severe protein-calorie malnutrition: Secondary | ICD-10-CM

## 2015-04-02 LAB — BASIC METABOLIC PANEL
ANION GAP: 7 (ref 5–15)
BUN: <5 mg/dL — ABNORMAL LOW (ref 6–20)
CHLORIDE: 116 mmol/L — AB (ref 101–111)
CO2: 20 mmol/L — AB (ref 22–32)
Calcium: 7 mg/dL — ABNORMAL LOW (ref 8.9–10.3)
Creatinine, Ser: 0.43 mg/dL — ABNORMAL LOW (ref 0.44–1.00)
GFR calc non Af Amer: >60 mL/min (ref >60)
GLUCOSE: 68 mg/dL (ref 65–99)
Potassium: 2.5 mmol/L — CL (ref 3.5–5.1)
SODIUM: 143 mmol/L (ref 135–145)

## 2015-04-02 LAB — GLUCOSE, CAPILLARY
Glucose-Capillary: 57 mg/dL — ABNORMAL LOW (ref 65–99)
Glucose-Capillary: 162 mg/dL — ABNORMAL HIGH (ref 65–99)
Glucose-Capillary: 70 mg/dL (ref 65–99)
Glucose-Capillary: 69 mg/dL (ref 65–99)
Glucose-Capillary: 143 mg/dL — ABNORMAL HIGH (ref 65–99)

## 2015-04-02 LAB — HEMOGLOBIN A1C
Hgb A1c MFr Bld: 6 % — ABNORMAL HIGH (ref 4.8–5.6)
Mean Plasma Glucose: 126 mg/dL

## 2015-04-02 MED ORDER — POTASSIUM CHLORIDE 10 MEQ/100ML IV SOLN
10.0000 meq | INTRAVENOUS | Status: AC
  Administered 2015-04-02 (×3): 10 meq via INTRAVENOUS
  Filled 2015-04-02 (×3): qty 100

## 2015-04-02 MED ORDER — OXYCODONE-ACETAMINOPHEN 5-325 MG PO TABS
1.0000 | ORAL_TABLET | Freq: Four times a day (QID) | ORAL | Status: DC | PRN
  Administered 2015-04-02 – 2015-04-05 (×5): 1 via ORAL
  Filled 2015-04-02 (×5): qty 1

## 2015-04-02 MED ORDER — FAMOTIDINE 40 MG PO TABS
40.0000 mg | ORAL_TABLET | Freq: Every day | ORAL | Status: DC
  Administered 2015-04-03 – 2015-04-05 (×3): 40 mg via ORAL
  Filled 2015-04-02 (×4): qty 1

## 2015-04-02 MED ORDER — SODIUM CHLORIDE 0.45 % IV SOLN
INTRAVENOUS | Status: DC
  Administered 2015-04-02 – 2015-04-04 (×3): via INTRAVENOUS
  Filled 2015-04-02 (×11): qty 1000

## 2015-04-02 MED ORDER — DEXTROSE 50 % IV SOLN
INTRAVENOUS | Status: AC
  Administered 2015-04-02: 50 mL
  Filled 2015-04-02: qty 50

## 2015-04-02 MED ORDER — POTASSIUM CHLORIDE CRYS ER 20 MEQ PO TBCR
40.0000 meq | EXTENDED_RELEASE_TABLET | ORAL | Status: DC
  Administered 2015-04-02: 40 meq via ORAL
  Filled 2015-04-02: qty 2

## 2015-04-02 MED ORDER — LACTULOSE 10 GM/15ML PO SOLN
10.0000 g | Freq: Every day | ORAL | Status: DC
  Administered 2015-04-03 – 2015-04-05 (×3): 10 g via ORAL
  Filled 2015-04-02 (×3): qty 15

## 2015-04-02 MED ORDER — APIXABAN 2.5 MG PO TABS
2.5000 mg | ORAL_TABLET | Freq: Two times a day (BID) | ORAL | Status: DC
  Administered 2015-04-02 – 2015-04-05 (×6): 2.5 mg via ORAL
  Filled 2015-04-02 (×9): qty 1

## 2015-04-02 MED ORDER — HYDROMORPHONE HCL 2 MG PO TABS
2.0000 mg | ORAL_TABLET | Freq: Four times a day (QID) | ORAL | Status: DC | PRN
  Administered 2015-04-03 – 2015-04-05 (×4): 2 mg via ORAL
  Filled 2015-04-02 (×4): qty 1

## 2015-04-02 NOTE — Progress Notes (Signed)
Cardiac Monitoring Event  Dysrhythmia:  Supraventricular tachycardia   Symptoms:  Asymptomatic  Level of Consciousness:  Alert  Last set of vital signs taken:  Temp: 98.2 F (36.8 C)  Pulse Rate: 68  Resp: 16  BP: (!) 135/91 mmHg  SpO2: 93 %  Name of MD Notified:  MD [name] paged.  Time MD Notified:  [phone]  Comments/Actions Taken:  Continue to monitor patient.

## 2015-04-02 NOTE — Progress Notes (Signed)
Orthopedic Tech Progress Note Patient Details:  [name] [birth year] [id number] Patient's original bledsoe braces soiled. [employer] called to replace. Patient ID: [name], female   DOB: Apr 28, 1961, 54 y.o.   MRN: [id number]   Orie Rout 04/02/2015, 2:18 PM

## 2015-04-02 NOTE — Progress Notes (Signed)
Hypoglycemic Event  CBG: 56  Treatment: D50 IV 50 mL  Symptoms: None  Follow-up CBG: Time: 0912 CBG Result: 162  Possible Reasons for Event: Inadequate meal intake  Comments/MD notified: MD [name] paged and notified that patient refusing is oral carbohydrate intervention as well oral medications.    [name]

## 2015-04-02 NOTE — Progress Notes (Signed)
Orthopedic Tech Progress Note Patient Details:  [name] 1960-11-06 [id number] Spoke with [employer], patient refused application of new bledsoe braces yesterday. Patient also refused a 2nd time today when asked. Will try to reach Dr. Magnus Ivan to inform.  Patient ID: [name], female   DOB: 1961/07/12, 54 y.o.   MRN: [id number]   Orie Rout 04/02/2015, 2:30 PM

## 2015-04-02 NOTE — Progress Notes (Signed)
CRITICAL VALUE ALERT  Critical value received:  Potassium 2.5  Date of notification:  04/02/15  Time of notification:  0720  Critical value read back:Yes.    Nurse who received alert:  Leane Platt RN  MD notified (1st page):  MD [name]  Time of first page:  0722  Responding MD:  MD [name]  Time MD responded:  [phone]

## 2015-04-02 NOTE — Progress Notes (Signed)
[facility] PROGRESS NOTE  [name] KRUMMEL JOA:[id number] DOB: 02/16/1961 DOA: 03/28/2015 PCP: Lottie Dawson, MD  Assessment/Plan: is a 54 y.o. female with PMH of hypertension, hyperlipidemia, diet-controlled diabetes mellitus, GERD, hypothyroidism, depression, chronic dizziness, history of PE on Eliquis, pancreatitis, asthma, recently diagnosed C. difficile colitis (C. difficile PCR positive on 03/21/15), who presents with bilateral knee pain and foot pain, and chest wall pain after fall. Will need SNF  Bilateral tibia fracture Continue with  Knee immobilizer.  Conservative management per ortho.  Awaiting ortho recommendation for DVT prophylaxis. Will need to take in consideration recent decrease in Hb.  Appreciate Dr Ninfa Linden assistance.   Anemia; acute blood loss.; Received 2 units PRBC hb at 10 and stable. CT Abdomen showed: Increased density in the subcutaneous tissues of both flanks. While this may reflect edema, in the setting of decreased blood counts, soft tissue hemorrhage is considered. Continue to monitor hb. Will start eliquis today  Tachycardia: unclear etiology; but appears to be secondary to dehydration and anemia -neg PE on CTA -hb stable. Continue with IV fluids.   Encephalopathy;  Patient agitated at times,  B12 and TSH WNL Ammonia slightly elevated; follow level in am  Chest pain:  Rib X ray: Right fifth through eighth anterior lateral rib fractures.  No Pneumothorax. Incentive spirometry and PRN analgesics  -History of PE; diagnosed 05-2014. eliquis was discontinue 11-2014. Resume during last admission in June.  -Due to acute blood loss and severe anemia, eliquis has been on hold. Will resume in 24-48 depending on Hgb trend   When Hb stable will need DVT prophylaxis. I repeated doppler LE and are negative for DVT.  Resume eliquis today  C. difficile colitis and sepsis Diagnosed last admission. Never fished treatment. Was not able to afford  medication.  Continue with oral vancomycin  DM-II: Last A1c 4.9 on 05/08/14, well controled. Patient is not taking med at home. -will monitor -A1C 6.0 -experiencing low CBG's -will d/c SSI -diet change to regular  Hypothyroidism:  -Continue home Synthroid -TSH 1.3  Increase bilirubin/ALK phosphatase Will continue holding statins for now Associated with bone fracture as well Will follow lft in am  Encephalopathy More alert and able to follow commands today. Will be Carefull and judicious with pain medications.  ammonia level pending today  Thrombocytopenia; improving.   Hypokalemia and Hypernatremia;  Continue with IV fluids.  Will replete potassium as needed Follow BMET in am   Code Status: Full Code.  Family Communication: son at bedside.  Disposition Plan: remain inpatient.    Consultants:  Ortho Dr Ninfa Linden.   Procedures:  none  Antibiotics:  Oral vancomycin  HPI/Subjective: Patient more alert, oriented X2 and able to follow commands. Denies SOB. No fever  Objective: Filed Vitals:   04/02/15 1646  BP: 109/78  Pulse: 109  Temp: 99.1 F (37.3 C)  Resp: 18    Intake/Output Summary (Last 24 hours) at 04/02/15 1717 Last data filed at 04/02/15 1323  Gross per 24 hour  Intake 3939.92 ml  Output     15 ml  Net 3924.92 ml   Filed Weights   03/28/15 1939 03/29/15 0023  Weight: 45.813 kg (101 lb) 41.504 kg (91 lb 8 oz)    Exam:   General:  More alert and able to follow commands and answer questions. No fever, no SOB. reprots generalized pain. oriented X2.Marland Kitchen Still with diarrhea.   Cardiovascular: S1, S2; tachycardic, no JVD  Respiratory: CTA bilaterally  Abdomen: BS present, soft, nt, no  guarding  Musculoskeletal: trace to 1+ edema, decrease range of motion and tenderness to palpation on both legs  Data Reviewed: Basic Metabolic Panel:  Recent Labs Lab 03/29/15 1036 03/30/15 1200 03/31/15 0535 04/01/15 0535 04/02/15 0455  NA 146* 146*  140 141 143  K 3.5 3.3* 4.0 3.4* 2.5*  CL 117* 112* 112* 116* 116*  CO2 20* 25 22 20* 20*  GLUCOSE 59* 200* 324* 279* 68  BUN <5* <5* <5* 10 <5*  CREATININE 0.69 0.71 0.49 0.59 0.43*  CALCIUM 7.2* 7.3* 6.1* 7.1* 7.0*  MG 1.6*  --   --   --   --    Liver Function Tests:  Recent Labs Lab 03/29/15 1036  AST 26  ALT 18  ALKPHOS 162*  BILITOT 1.9*  PROT 4.7*  ALBUMIN 1.6*    Recent Labs Lab 03/31/15 1546  AMMONIA 50*   CBC:  Recent Labs Lab 03/28/15 2334  03/30/15 0525 03/30/15 1200 03/30/15 1750 03/31/15 0535 04/01/15 0535  WBC 8.9  < > 10.3 10.9* 9.4 10.5 5.9  NEUTROABS 5.2  --   --   --   --   --   --   HGB 5.7*  < > 10.6* 10.5* 10.4* 11.2* 10.8*  HCT 18.0*  < > 31.6* 31.4* 31.6* 33.4* 32.2*  MCV 95.2  < > 88.3 88.5 89.3 90.8 90.2  PLT 127*  < > 100* 112* 95* 72* 48*  < > = values in this interval not displayed.  BNP (last 3 results)  Recent Labs  12/28/14 1905  BNP 77.5    ProBNP (last 3 results)  Recent Labs  07/26/14 1138  PROBNP 440.7*    CBG:  Recent Labs Lab 04/01/15 2149 04/02/15 0758 04/02/15 0912 04/02/15 1212 04/02/15 1641  GLUCAP 99 57* 162* 70 69    Recent Results (from the past 240 hour(s))  Culture, blood (routine x 2)     Status: None (Preliminary result)   Collection Time: 03/29/15  2:39 AM  Result Value Ref Range Status   Specimen Description BLOOD LEFT ARM  Final   Special Requests IN PEDIATRIC BOTTLE 1CC  Final   Culture NO GROWTH 4 DAYS  Final   Report Status PENDING  Incomplete  Culture, blood (routine x 2)     Status: None (Preliminary result)   Collection Time: 03/29/15  2:46 AM  Result Value Ref Range Status   Specimen Description BLOOD LEFT ARM  Final   Special Requests IN PEDIATRIC BOTTLE 1CC  Final   Culture NO GROWTH 4 DAYS  Final   Report Status PENDING  Incomplete     Studies: Ct Head Wo Contrast  03/31/2015   CLINICAL DATA:  Confusion.  EXAM: CT HEAD WITHOUT CONTRAST  TECHNIQUE: Contiguous axial  images were obtained from the base of the skull through the vertex without intravenous contrast.  COMPARISON:  02/28/2015  FINDINGS: Since the prior head CT, the patient has developed areas of extensive hypoattenuation symmetrically throughout the subcortical and deep cerebral white matter bilaterally. There is no significant associated mass effect. No definite underlying mass is identified. No acute intracranial hemorrhage, midline shift, or extra-axial fluid collection is seen. Patchy hypodensities are present in the cerebellum as well. There is no definite evidence of acute large territory cortical infarct.  Orbits are unremarkable.  IMPRESSION: Extensive areas of hypodensity throughout the cerebral white matter and patchy involvement of the cerebellum, new from the recent prior CT. No significant mass effect. Brain MRI (without and with  IV contrast) is recommended for further evaluation, with possible considerations including encephalitis, fulminant demyelination, and toxic and metabolic leukoencephalopathies.  These results will be called to the ordering clinician or representative by the Radiologist Assistant, and communication documented in the PACS or zVision Dashboard.   Electronically Signed   By: Logan Bores   On: 03/31/2015 21:48   Ct Angio Chest Pe W/cm &/or Wo Cm  03/31/2015   CLINICAL DATA:  History of previous pulmonary embolus, tachycardia  EXAM: CT ANGIOGRAPHY CHEST WITH CONTRAST  TECHNIQUE: Multidetector CT imaging of the chest was performed using the standard protocol during bolus administration of intravenous contrast. Multiplanar CT image reconstructions and MIPs were obtained to evaluate the vascular anatomy.  CONTRAST:  33m OMNIPAQUE IOHEXOL 350 MG/ML SOLN  COMPARISON:  02/28/2015, 05/08/2014  FINDINGS: The lungs are well aerated bilaterally. A mild amount of left basilar atelectasis with associated small effusion is seen. The right lung is clear. A right-sided PICC line is noted. The  thoracic inlet is within normal limits. The thoracic aorta and its branches are unremarkable with the exception of a bovine arch anomaly. Heavy coronary calcifications are seen.  Pulmonary artery shows a normal branching pattern. No filling defects to suggest pulmonary emboli are identified. The previously seen emboli have resolved the interval. No significant hilar or mediastinal adenopathy is noted.  Scanning into the upper abdomen demonstrates mild ascites surrounding the liver. No other definitive abnormality is seen. The osseous structures show evidence of prior rib fractures of the right sixth rib posterior laterally. No left-sided rib fractures are seen.  Review of the MIP images confirms the above findings.  IMPRESSION: No evidence of pulmonary emboli.  Right sixth rib fracture this is new from the recent exam from 1 month ago.  Mild left basilar atelectasis and small effusion.  Mild ascites.   Electronically Signed   By: MInez CatalinaM.D.   On: 03/31/2015 21:18    Scheduled Meds: . sodium chloride   Intravenous Once  . calcium carbonate  1 tablet Oral Q breakfast  . famotidine  40 mg Oral Daily  . feeding supplement (RESOURCE BREEZE)  1 Container Oral TID BM  . folic acid  1 mg Oral Daily  . lactulose  10 g Oral BID  . levothyroxine  25 mcg Oral QAC breakfast  . nicotine  21 mg Transdermal Q24H  . saccharomyces boulardii  250 mg Oral BID  . sodium chloride  3 mL Intravenous Q12H  . thiamine  100 mg Oral Daily  . vancomycin  125 mg Oral 4 times per day  . venlafaxine  25 mg Oral BID   Continuous Infusions: . sodium chloride 0.45 % 1,000 mL with potassium chloride 40 mEq infusion 75 mL/hr at 04/02/15 1031    Principal Problem:   Fracture, tibia Active Problems:   DM2 (diabetes mellitus, type 2)   Anxiety state   Depression   Peripheral neuropathy   Asthma   Normocytic anemia   Nicotine abuse   History of pulmonary embolus (PE)   Chest pain, musculoskeletal   Severe  protein-calorie malnutrition   Fall   Enteritis due to Clostridium difficile   Hypothyroidism   GERD (gastroesophageal reflux disease)   HLD (hyperlipidemia)   Sepsis   Tibial fracture    Time spent: 30 minutes.     MBarton Dubois [facility] Pager [phone] If 7PM-7AM, please contact night-coverage at [email or web address], password [email or web address]7/11/2014, 5:17 PM  LOS: 5 days

## 2015-04-02 NOTE — Progress Notes (Signed)
Spoke with [employer], conveyed to her the patient does not have BLE Bledsoe braces applied or in room as ordered (Prior night shift RN Humberto Seals said they were soiled from stool).  I am unaware of how long the braces have not been applied for, and was not able to get any clarification on this. [employer] stated she will order these braces from an outside vendor today and they should be here later this afternoon.

## 2015-04-02 NOTE — Clinical Social Work Note (Signed)
Clinical Social Work Assessment  Patient Details  Name: [name] MRN: [id number] Date of Birth: August 07, 1961  Date of referral:  04/01/15               Reason for consult:  Facility Placement, Discharge Planning                Permission sought to share information with:  Facility Medical sales representative, Family Supports Permission granted to share information::  Yes, Verbal Permission Granted  Name::     [name]  Agency::  SNFs  Relationship::     Contact Information:     Housing/Transportation Living arrangements for the past 2 months:  Apartment Source of Information:  Patient, Adult Children Patient Interpreter Needed:  None Criminal Activity/Legal Involvement Pertinent to Current Situation/Hospitalization:  No - Comment as needed Significant Relationships:  Adult Children Lives with:  Adult Children Do you feel safe going back to the place where you live?  Yes Need for family participation in patient care:  Yes (Comment)  Care giving concerns:  Patient's son [name] requests patient be placed for short term rehab.   Social Worker assessment / plan:  Initially family planned for patient to return home at discharge. CSW received update from [name] RN and RNCM that family is stating that they would like the patient placed. CSW spoke with patient's son [name] by phone to complete assessment as no family currently at bedside. [name] states that the patient lives with him at home and plans for the patient to return once short term rehab is complete. CSW explained SNF search/placement process and answered [name]'s questions.   Employment status:  Disabled (Comment on whether or not currently receiving Disability) Insurance information:  Medicare PT Recommendations:  Skilled Nursing Facility Information / Referral to community resources:  Skilled Nursing Facility  Patient/Family's Response to care:  Patient and family appear happy with the care the patient has received. No complaints noted.    Patient/Family's Understanding of and Emotional Response to Diagnosis, Current Treatment, and Prognosis:  Patient appears to have minimal insight into reason for admission. Patient's son has better understanding of reason for admission and verbalizes what he thinks the patient will need at discharge.  Emotional Assessment Appearance:  Appears stated age Attitude/Demeanor/Rapport:  Other (Appropriate) Affect (typically observed):  Accepting, Calm Orientation:  Oriented to Self, Oriented to Place Alcohol / Substance use:  Tobacco Use Psych involvement (Current and /or in the community):  No (Comment)  Discharge Needs  Concerns to be addressed:  Discharge Planning Concerns Readmission within the last 30 days:  Yes Current discharge risk:  Chronically ill, Physical Impairment, Cognitively Impaired Barriers to Discharge:  Continued Medical Work up   [name] MSW, [place], [place], [phone]

## 2015-04-02 NOTE — Clinical Social Work Placement (Signed)
   CLINICAL SOCIAL WORK PLACEMENT  NOTE  Date:  04/02/2015  Patient Details  Name: [name] MRN: [id number] Date of Birth: 13-Aug-1961  Clinical Social Work is seeking post-discharge placement for this patient at the Skilled  Nursing Facility level of care (*CSW will initial, date and re-position this form in  chart as items are completed):  Yes   Patient/family provided with [facility] Clinical Social Work Department's list of facilities offering this level of care within the geographic area requested by the patient (or if unable, by the patient's family).  Yes   Patient/family informed of their freedom to choose among providers that offer the needed level of care, that participate in Medicare, Medicaid or managed care program needed by the patient, have an available bed and are willing to accept the patient.  Yes   Patient/family informed of [facility]'s ownership interest in [facility] and [facility], as well as of the fact that they are under no obligation to receive care at these facilities.  PASRR submitted to EDS on       PASRR number received on       Existing PASRR number confirmed on 04/01/15     FL2 transmitted to all facilities in geographic area requested by pt/family on 04/01/15     FL2 transmitted to all facilities within larger geographic area on       Patient informed that his/her managed care company has contracts with or will negotiate with certain facilities, including the following:        Yes   Patient/family informed of bed offers received.  Patient chooses bed at       Physician recommends and patient chooses bed at      Patient to be transferred to   on  .  Patient to be transferred to facility by       Patient family notified on   of transfer.  Name of family member notified:        PHYSICIAN Please prepare priority discharge summary, including medications, Please prepare prescriptions, Please sign FL2     Additional Comment:   Bed offers have been given to patient's son. MD, please write DC order and DC Summary once patient is ready for DC. Patient has received offers from facilities that are capable of doing a weekend admission.   _______________________________________________ Roddie Mc MSW, [place], [place], [phone]

## 2015-04-03 DIAGNOSIS — A419 Sepsis, unspecified organism: Secondary | ICD-10-CM

## 2015-04-03 DIAGNOSIS — E118 Type 2 diabetes mellitus with unspecified complications: Secondary | ICD-10-CM

## 2015-04-03 DIAGNOSIS — F329 Major depressive disorder, single episode, unspecified: Secondary | ICD-10-CM

## 2015-04-03 DIAGNOSIS — S82201D Unspecified fracture of shaft of right tibia, subsequent encounter for closed fracture with routine healing: Secondary | ICD-10-CM

## 2015-04-03 LAB — CBC
HEMATOCRIT: 24.3 % — AB (ref 36.0–46.0)
Hemoglobin: 8 g/dL — ABNORMAL LOW (ref 12.0–15.0)
MCH: 30.2 pg (ref 26.0–34.0)
MCHC: 32.9 g/dL (ref 30.0–36.0)
MCV: 91.7 fL (ref 78.0–100.0)
Platelets: 59 10*3/uL — ABNORMAL LOW (ref 150–400)
RBC: 2.65 MIL/uL — ABNORMAL LOW (ref 3.87–5.11)
RDW: 18.9 % — AB (ref 11.5–15.5)
WBC: 10.5 10*3/uL (ref 4.0–10.5)

## 2015-04-03 LAB — GLUCOSE, CAPILLARY
GLUCOSE-CAPILLARY: 69 mg/dL (ref 65–99)
GLUCOSE-CAPILLARY: 124 mg/dL — AB (ref 65–99)
Glucose-Capillary: 71 mg/dL (ref 65–99)
Glucose-Capillary: 82 mg/dL (ref 65–99)

## 2015-04-03 LAB — CULTURE, BLOOD (ROUTINE X 2)
Culture: NO GROWTH
Culture: NO GROWTH

## 2015-04-03 LAB — HEPATIC FUNCTION PANEL
ALBUMIN: 1.3 g/dL — AB (ref 3.5–5.0)
ALT: 17 U/L (ref 14–54)
AST: 27 U/L (ref 15–41)
Alkaline Phosphatase: 111 U/L (ref 38–126)
BILIRUBIN DIRECT: 0.2 mg/dL (ref 0.1–0.5)
BILIRUBIN INDIRECT: 0.2 mg/dL — AB (ref 0.3–0.9)
TOTAL PROTEIN: 3.5 g/dL — AB (ref 6.5–8.1)
Total Bilirubin: 0.4 mg/dL (ref 0.3–1.2)

## 2015-04-03 LAB — BASIC METABOLIC PANEL
Anion gap: 7 (ref 5–15)
BUN: <5 mg/dL — ABNORMAL LOW (ref 6–20)
CO2: 19 mmol/L — ABNORMAL LOW (ref 22–32)
Calcium: 7.1 mg/dL — ABNORMAL LOW (ref 8.9–10.3)
Chloride: 118 mmol/L — ABNORMAL HIGH (ref 101–111)
Creatinine, Ser: 0.46 mg/dL (ref 0.44–1.00)
GFR calc non Af Amer: >60 mL/min (ref >60)
Glucose, Bld: 77 mg/dL (ref 65–99)
POTASSIUM: 4.3 mmol/L (ref 3.5–5.1)
Sodium: 144 mmol/L (ref 135–145)

## 2015-04-03 LAB — AMMONIA: AMMONIA: 21 umol/L (ref 9–35)

## 2015-04-03 MED ORDER — ROSUVASTATIN CALCIUM 20 MG PO TABS
20.0000 mg | ORAL_TABLET | Freq: Every day | ORAL | Status: DC
  Administered 2015-04-03 – 2015-04-04 (×2): 20 mg via ORAL
  Filled 2015-04-03 (×4): qty 1

## 2015-04-03 NOTE — Progress Notes (Addendum)
[facility] PROGRESS NOTE  [name] AUQ:[id number] DOB: Apr 10, 1961 DOA: 03/28/2015 PCP: Lottie Dawson, MD  Assessment/Plan: is a 54 y.o. female with PMH of hypertension, hyperlipidemia, diet-controlled diabetes mellitus, GERD, hypothyroidism, depression, chronic dizziness, history of PE on Eliquis, pancreatitis, asthma, recently diagnosed C. difficile colitis (C. difficile PCR positive on 03/21/15), who presents with bilateral knee pain and foot pain, and chest wall pain after fall. Will need SNF  Bilateral tibia fracture Continue with  bledsoe bilaterally Conservative management per ortho.  Will use eliquis prophylaxis dose (2.98m BID) Follow Hgb trend very closely Appreciate Dr BNinfa Lindenassistance.   Anemia; acute blood loss.; Received 2 units PRBC hb at 10 and stable. CT Abdomen showed: Increased density in the subcutaneous tissues of both flanks. While this may reflect edema, in the setting of decreased blood counts, soft tissue hemorrhage is considered. Continue to monitor hb. Will start eliquis today  Tachycardia: unclear etiology; but appears to be secondary to dehydration and anemia -neg PE on CTA -hb stable. Continue with IV fluids.   Encephalopathy;  Patient agitated at times,  B12 and TSH WNL Ammonia level now within normal limits   Chest pain:  Rib X ray: Right fifth through eighth anterior lateral rib fractures.  No Pneumothorax. Continue Incentive spirometry and PRN analgesics  History of PE; diagnosed 05-2014. eliquis was discontinue 11-2014. Resume during last admission in June.  -Due to acute blood loss and severe anemia, eliquis has been on hold.  -CTA demonstarted no PE -will use eliquis prophylaxis dose only   When Hb stable will need DVT prophylaxis. I repeated doppler LE and are negative for DVT.  eliquis for DVT prophylaxis has been added (2.5 mg twice a day)  C. difficile colitis and sepsis Diagnosed last admission. Never  finished treatment. Was not able to afford medication.  Continue with oral vancomycin  DM-II: Last A1c 4.9 on 05/08/14, well controled. Patient is not taking med at home. -will monitor -A1C 6.0 -experiencing low CBG's when receiving SSI -will d/c SSI -diet change to regular, patient was not eating.  Hypothyroidism:  -Continue home Synthroid -TSH 1.3  Increase bilirubin/ALK phosphatase Resolved now Most likely associated with fractures Will resume statins  Encephalopathy AAOX2, able to follow commands and cooperative with treatment and staff ammonia level WNL now (21)  Thrombocytopenia; improving.  Most likely in setting of sepsis -will monitor  Hypokalemia and Hypernatremia;  Continue with IV fluids.  Potassium 4.5  Code Status: Full Code.  Family Communication: son at bedside.  Disposition Plan: remain inpatient.    Consultants:  Ortho Dr BNinfa Linden   Procedures:  none  Antibiotics:  Oral vancomycin  HPI/Subjective: Patient still with diarrhea, no CP, no SOB. AAOX2 and cooperative with treatment today. No overt bleeding appreciated  Objective: Filed Vitals:   04/03/15 1520  BP: 128/82  Pulse: 98  Temp: 98.5 F (36.9 C)  Resp: 19    Intake/Output Summary (Last 24 hours) at 04/03/15 1646 Last data filed at 04/03/15 1532  Gross per 24 hour  Intake   1297 ml  Output      3 ml  Net   1294 ml   Filed Weights   03/28/15 1939 03/29/15 0023  Weight: 45.813 kg (101 lb) 41.504 kg (91 lb 8 oz)    Exam:   General:  AAOX2, able to follow commands and cooperative. No fever, no SOB. Still complaining of pain all over, but endorses that PO dilaudid working better than IV morphine. Still with diarrhea.  Cardiovascular: S1, S2; tachycardic, no JVD  Respiratory: CTA bilaterally  Abdomen: BS present, soft, nt, no guarding  Musculoskeletal: trace to 1+ edema bilaterally, decrease range of motion and tenderness to palpation on both legs. bedsoe braces in  place  Data Reviewed: Basic Metabolic Panel:  Recent Labs Lab 03/29/15 1036 03/30/15 1200 03/31/15 0535 04/01/15 0535 04/02/15 0455 04/03/15 0600  NA 146* 146* 140 141 143 144  K 3.5 3.3* 4.0 3.4* 2.5* 4.3  CL 117* 112* 112* 116* 116* 118*  CO2 20* 25 22 20* 20* 19*  GLUCOSE 59* 200* 324* 279* 68 77  BUN <5* <5* <5* 10 <5* <5*  CREATININE 0.69 0.71 0.49 0.59 0.43* 0.46  CALCIUM 7.2* 7.3* 6.1* 7.1* 7.0* 7.1*  MG 1.6*  --   --   --   --   --    Liver Function Tests:  Recent Labs Lab 03/29/15 1036 04/03/15 0600  AST 26 27  ALT 18 17  ALKPHOS 162* 111  BILITOT 1.9* 0.4  PROT 4.7* 3.5*  ALBUMIN 1.6* 1.3*    Recent Labs Lab 03/31/15 1546 04/03/15 0610  AMMONIA 50* 21   CBC:  Recent Labs Lab 03/28/15 2334  03/30/15 1200 03/30/15 1750 03/31/15 0535 04/01/15 0535 04/03/15 0600  WBC 8.9  < > 10.9* 9.4 10.5 5.9 10.5  NEUTROABS 5.2  --   --   --   --   --   --   HGB 5.7*  < > 10.5* 10.4* 11.2* 10.8* 8.0*  HCT 18.0*  < > 31.4* 31.6* 33.4* 32.2* 24.3*  MCV 95.2  < > 88.5 89.3 90.8 90.2 91.7  PLT 127*  < > 112* 95* 72* 48* 59*  < > = values in this interval not displayed.  BNP (last 3 results)  Recent Labs  12/28/14 1905  BNP 77.5    ProBNP (last 3 results)  Recent Labs  07/26/14 1138  PROBNP 440.7*    CBG:  Recent Labs Lab 04/02/15 1641 04/02/15 2125 04/03/15 0832 04/03/15 1201 04/03/15 1603  GLUCAP 69 143* 69 71 82    Recent Results (from the past 240 hour(s))  Culture, blood (routine x 2)     Status: None   Collection Time: 03/29/15  2:39 AM  Result Value Ref Range Status   Specimen Description BLOOD LEFT ARM  Final   Special Requests IN PEDIATRIC BOTTLE [place]  Final   Culture NO GROWTH 5 DAYS  Final   Report Status 04/03/2015 FINAL  Final  Culture, blood (routine x 2)     Status: None   Collection Time: 03/29/15  2:46 AM  Result Value Ref Range Status   Specimen Description BLOOD LEFT ARM  Final   Special Requests IN PEDIATRIC  BOTTLE [place]  Final   Culture NO GROWTH 5 DAYS  Final   Report Status 04/03/2015 FINAL  Final     Studies: No results found.  Scheduled Meds: . sodium chloride   Intravenous Once  . apixaban  2.5 mg Oral BID  . calcium carbonate  1 tablet Oral Q breakfast  . famotidine  40 mg Oral Daily  . feeding supplement (RESOURCE BREEZE)  1 Container Oral TID BM  . folic acid  1 mg Oral Daily  . lactulose  10 g Oral Daily  . levothyroxine  25 mcg Oral QAC breakfast  . nicotine  21 mg Transdermal Q24H  . saccharomyces boulardii  250 mg Oral BID  . sodium chloride  3 mL Intravenous Q12H  .  thiamine  100 mg Oral Daily  . vancomycin  125 mg Oral 4 times per day  . venlafaxine  25 mg Oral BID   Continuous Infusions: . sodium chloride 0.45 % 1,000 mL with potassium chloride 40 mEq infusion 75 mL/hr at 04/03/15 0630    Principal Problem:   Fracture, tibia Active Problems:   DM2 (diabetes mellitus, type 2)   Anxiety state   Depression   Peripheral neuropathy   Asthma   Normocytic anemia   Nicotine abuse   History of pulmonary embolus (PE)   Chest pain, musculoskeletal   Severe protein-calorie malnutrition   Fall   Enteritis due to Clostridium difficile   Hypothyroidism   GERD (gastroesophageal reflux disease)   HLD (hyperlipidemia)   Sepsis   Tibial fracture    Time spent: 30 minutes.     Barton Dubois  [facility] Pager [phone]. If 7PM-7AM, please contact night-coverage at [email or web address], password [facility] 04/03/2015, 4:46 PM  LOS: 6 days

## 2015-04-03 NOTE — Progress Notes (Signed)
MD [name] made aware of patient low H/H at bedside. Will continue to monitor patient.

## 2015-04-04 DIAGNOSIS — R0789 Other chest pain: Secondary | ICD-10-CM

## 2015-04-04 LAB — CBC
HCT: 27.3 % — ABNORMAL LOW (ref 36.0–46.0)
Hemoglobin: 8.8 g/dL — ABNORMAL LOW (ref 12.0–15.0)
MCH: 29.8 pg (ref 26.0–34.0)
MCHC: 32.2 g/dL (ref 30.0–36.0)
MCV: 92.5 fL (ref 78.0–100.0)
PLATELETS: 111 10*3/uL — AB (ref 150–400)
RBC: 2.95 MIL/uL — ABNORMAL LOW (ref 3.87–5.11)
RDW: 18.8 % — ABNORMAL HIGH (ref 11.5–15.5)
WBC: 11.5 10*3/uL — ABNORMAL HIGH (ref 4.0–10.5)

## 2015-04-04 LAB — GLUCOSE, CAPILLARY
GLUCOSE-CAPILLARY: 75 mg/dL (ref 65–99)
Glucose-Capillary: 74 mg/dL (ref 65–99)
Glucose-Capillary: 73 mg/dL (ref 65–99)
Glucose-Capillary: 66 mg/dL (ref 65–99)

## 2015-04-04 NOTE — Clinical Social Work Note (Signed)
CSW left message with patient's son in attempt to determine which facility he would like for her to go to. CSW explained in message that patient is expected to DC on 7/5. CSW will wait for call back.   Roddie Mc MSW, [place], [place], [phone]

## 2015-04-04 NOTE — Progress Notes (Deleted)
MD David Stall paged, to confirm orders for two simultaneous maintenance IV fluids.

## 2015-04-04 NOTE — Progress Notes (Signed)
Physical Therapy Treatment Patient Details Name: [name] MRN: [id number] DOB: 10/20/1960 Today's Date: 04/04/2015    History of Present Illness 54 yo female with multiple medical problems who was even recently hospitalized. Apparently discharged recently, then sustained a mechanical fall or was even potentially accidentally dropped. She was admitted  severely anemic and with bilateral leg pain. Xrays show proximal tibia fractures .  Pt is being treated non-operatively with NWB B LE at least 6-8 weeks.    PT Comments    Patient progressing very slowly towards PT goals. Pt very anxious with any mobility and has poor pain tolerance vs anticipation of pain limiting session. Agreeable to OOB. Performed A-P transfer to chair with total A of 2. Frequency decreased to 2x/week secondary to poor activity tolerance. Appropriate for ST SNF. Will continue to follow per updated POC.   Follow Up Recommendations  SNF;Supervision/Assistance - 24 hour     Equipment Recommendations  None recommended by PT    Recommendations for Other Services       Precautions / Restrictions Precautions Precautions: Fall Precaution Comments: recent 8th R rib fx Required Braces or Orthoses: Other Brace/Splint Other Brace/Splint: Bil bledsoe braces on at all times.  Restrictions Weight Bearing Restrictions: Yes RLE Weight Bearing: Non weight bearing LLE Weight Bearing: Non weight bearing    Mobility  Bed Mobility Overal bed mobility: Needs Assistance;+2 for physical assistance Bed Mobility: Supine to Sit (supine to log sitting)     Supine to sit: Total assist;+2 for physical assistance     General bed mobility comments: Total A of 2 to get pt to long sitting to prepare for transfer. Pt with low pain tolerance.   Transfers Overall transfer level: Needs assistance   Transfers: Licensed conveyancer transfers: Total assist;+2 physical assistance   General transfer  comment: A-p transfer to chair with total A of 2 with 3rd person assisting with BLEs. Pt hollering when coming near BLEs.   Ambulation/Gait                 Stairs            Wheelchair Mobility    Modified Rankin (Stroke Patients Only)       Balance   Sitting-balance support: No upper extremity supported Sitting balance-Leahy Scale: Zero Sitting balance - Comments: Pt able to activate core to assist minimally with long sitting. Mostly Max A-total A.  Postural control: Posterior lean                          Cognition Arousal/Alertness: Awake/alert Behavior During Therapy: Anxious Overall Cognitive Status: No family/caregiver present to determine baseline cognitive functioning                 General Comments: Perseverating on not wanting to be touched. "Do not touch me! I told him not to touch me!" (even when sitter not touching pt).    Exercises General Exercises - Lower Extremity Ankle Circles/Pumps: Both;10 reps;Supine    General Comments General comments (skin integrity, edema, etc.): Pt hollering when sitter/therapist comes near BLEs. Attempted to remove pillow under knees however pt yelling " no! no!"      Pertinent Vitals/Pain Pain Assessment: Faces Faces Pain Scale: Hurts even more Pain Location: legs Pain Descriptors / Indicators: Sore Pain Intervention(s): Monitored during session;Premedicated before session;Repositioned    Home Living  Prior Function            PT Goals (current goals can now be found in the care plan section) Progress towards PT goals: Progressing toward goals    Frequency  Min 2X/week    PT Plan Discharge plan needs to be updated    Co-evaluation             End of Session Equipment Utilized During Treatment: Other (comment) (Bil bledsoe braces) Activity Tolerance: Patient limited by pain Patient left: in chair;with call bell/phone within reach;with  nursing/sitter in room     Time: 0920-0932 PT Time Calculation (min) (ACUTE ONLY): 12 min  Charges:  $Therapeutic Activity: 8-22 mins                    G Codes:      Rudie Rikard A Makenna Macaluso 04/04/2015, 10:19 AM Mylo Red, PT, DPT [phone]

## 2015-04-04 NOTE — Progress Notes (Addendum)
[facility] PROGRESS NOTE  [name] IWP:[id number] DOB: 1961-08-30 DOA: 03/28/2015 PCP: Lottie Dawson, MD  Assessment/Plan: is a 54 y.o. female with PMH of hypertension, hyperlipidemia, diet-controlled diabetes mellitus, GERD, hypothyroidism, depression, chronic dizziness, history of PE on Eliquis, pancreatitis, asthma, recently diagnosed C. difficile colitis (C. difficile PCR positive on 03/21/15), who presents with bilateral knee pain and foot pain, and chest wall pain after fall. Will need SNF  Bilateral tibia fracture Continue with  bledsoe bilaterally Conservative management per ortho.  Will use eliquis prophylaxis dose (2.7m BID) Follow Hgb trend very closely Appreciate Dr BNinfa Lindenassistance.   Anemia; acute blood loss.; Received 2 units PRBC hb went up to 10 and is now 8.8-9. CT Abdomen showed: Increased density in the subcutaneous tissues of both flanks. While this may reflect edema, in the setting of decreased blood counts, soft tissue hemorrhage is considered. Continue to monitor hgb trend Eliquis resumed  Tachycardia: unclear etiology; but appears to be secondary to dehydration and anemia -neg PE on CTA -hb stable. Continue with IV fluids.   Encephalopathy;  Patient agitated at times,  B12 and TSH WNL Ammonia level now within normal limits   Chest pain:  Rib X ray: Right fifth through eighth anterior lateral rib fractures.  No Pneumothorax. Continue Incentive spirometry and PRN analgesics  History of PE; diagnosed 05-2014. eliquis was discontinue 11-2014. Resume during last admission in June.  -Due to acute blood loss and severe anemia, eliquis has been on hold.  -CTA demonstarted no PE -will use eliquis prophylaxis dose only   When Hb stable will need DVT prophylaxis. I repeated doppler LE and are negative for DVT.  eliquis for DVT prophylaxis has been added (2.5 mg twice a day)  C. difficile colitis and sepsis Diagnosed last  admission. Never finished treatment. Was not able to afford medication.  Continue with oral vancomycin  DM-II: Last A1c 4.9 on 05/08/14, well controled. Patient is not taking med at home. -will monitor -A1C 6.0 -experiencing low CBG's when receiving SSI -will d/c SSI -diet change to regular, patient was not eating.  Hypothyroidism:  -Continue home Synthroid -TSH 1.3  Increase bilirubin/ALK phosphatase Resolved now Most likely associated with fractures Will resume statins  Encephalopathy AAOX2, able to follow commands and cooperative with treatment and staff ammonia level WNL now   Thrombocytopenia; improving.  -due to sepsis -will monitor  Hypokalemia and Hypernatremia;  Resolved with IVF's and potassium repletion Will monitor   Code Status: Full Code.  Family Communication: son at bedside.  Disposition Plan: will discharge to SNF in am if remains stable and bed available   Consultants:  Ortho Dr BNinfa Linden   Procedures:  none  Antibiotics:  Oral vancomycin  HPI/Subjective: Patient denies CP and SOB. Patient diarrhea is better and overall pain is more controlled.  Objective: Filed Vitals:   04/04/15 1310  BP: 133/93  Pulse: 70  Temp: 99 F (37.2 C)  Resp: 20    Intake/Output Summary (Last 24 hours) at 04/04/15 1631 Last data filed at 04/04/15 0909  Gross per 24 hour  Intake 1853.75 ml  Output      9 ml  Net 1844.75 ml   Filed Weights   03/28/15 1939 03/29/15 0023  Weight: 45.813 kg (101 lb) 41.504 kg (91 lb 8 oz)    Exam:   General:  AAOX2, able to follow commands and cooperative. No fever, no SOB. Pain well controlled with PO meds. Diarrhea improved  Cardiovascular: S1, S2; tachycardic, no JVD  Respiratory: CTA bilaterally  Abdomen: BS present, soft, nt, no guarding  Musculoskeletal: trace to 1+ edema bilaterally, decrease range of motion and tenderness to palpation on both legs. bedsoe braces in place  Data Reviewed: Basic  Metabolic Panel:  Recent Labs Lab 03/29/15 1036 03/30/15 1200 03/31/15 0535 04/01/15 0535 04/02/15 0455 04/03/15 0600  NA 146* 146* 140 141 143 144  K 3.5 3.3* 4.0 3.4* 2.5* 4.3  CL 117* 112* 112* 116* 116* 118*  CO2 20* 25 22 20* 20* 19*  GLUCOSE 59* 200* 324* 279* 68 77  BUN <5* <5* <5* 10 <5* <5*  CREATININE 0.69 0.71 0.49 0.59 0.43* 0.46  CALCIUM 7.2* 7.3* 6.1* 7.1* 7.0* 7.1*  MG 1.6*  --   --   --   --   --    Liver Function Tests:  Recent Labs Lab 03/29/15 1036 04/03/15 0600  AST 26 27  ALT 18 17  ALKPHOS 162* 111  BILITOT 1.9* 0.4  PROT 4.7* 3.5*  ALBUMIN 1.6* 1.3*    Recent Labs Lab 03/31/15 1546 04/03/15 0610  AMMONIA 50* 21   CBC:  Recent Labs Lab 03/28/15 2334  03/30/15 1750 03/31/15 0535 04/01/15 0535 04/03/15 0600 04/04/15 0500  WBC 8.9  < > 9.4 10.5 5.9 10.5 11.5*  NEUTROABS 5.2  --   --   --   --   --   --   HGB 5.7*  < > 10.4* 11.2* 10.8* 8.0* 8.8*  HCT 18.0*  < > 31.6* 33.4* 32.2* 24.3* 27.3*  MCV 95.2  < > 89.3 90.8 90.2 91.7 92.5  PLT 127*  < > 95* 72* 48* 59* 111*  < > = values in this interval not displayed.  BNP (last 3 results)  Recent Labs  12/28/14 1905  BNP 77.5    ProBNP (last 3 results)  Recent Labs  07/26/14 1138  PROBNP 440.7*    CBG:  Recent Labs Lab 04/03/15 1201 04/03/15 1603 04/03/15 2212 04/04/15 0756 04/04/15 1208  GLUCAP 71 82 124* 75 74    Recent Results (from the past 240 hour(s))  Culture, blood (routine x 2)     Status: None   Collection Time: 03/29/15  2:39 AM  Result Value Ref Range Status   Specimen Description BLOOD LEFT ARM  Final   Special Requests IN PEDIATRIC BOTTLE [place]  Final   Culture NO GROWTH 5 DAYS  Final   Report Status 04/03/2015 FINAL  Final  Culture, blood (routine x 2)     Status: None   Collection Time: 03/29/15  2:46 AM  Result Value Ref Range Status   Specimen Description BLOOD LEFT ARM  Final   Special Requests IN PEDIATRIC BOTTLE [place]  Final   Culture NO  GROWTH 5 DAYS  Final   Report Status 04/03/2015 FINAL  Final     Studies: No results found.  Scheduled Meds: . sodium chloride   Intravenous Once  . apixaban  2.5 mg Oral BID  . calcium carbonate  1 tablet Oral Q breakfast  . famotidine  40 mg Oral Daily  . feeding supplement (RESOURCE BREEZE)  1 Container Oral TID BM  . folic acid  1 mg Oral Daily  . lactulose  10 g Oral Daily  . levothyroxine  25 mcg Oral QAC breakfast  . nicotine  21 mg Transdermal Q24H  . rosuvastatin  20 mg Oral q1800  . saccharomyces boulardii  250 mg Oral BID  . sodium chloride  3 mL Intravenous  Q12H  . thiamine  100 mg Oral Daily  . vancomycin  125 mg Oral 4 times per day  . venlafaxine  25 mg Oral BID   Continuous Infusions: . sodium chloride 0.45 % 1,000 mL with potassium chloride 40 mEq infusion 75 mL/hr at 04/04/15 1559    Principal Problem:   Fracture, tibia Active Problems:   DM2 (diabetes mellitus, type 2)   Anxiety state   Depression   Peripheral neuropathy   Asthma   Normocytic anemia   Nicotine abuse   History of pulmonary embolus (PE)   Chest pain, musculoskeletal   Severe protein-calorie malnutrition   Fall   Enteritis due to Clostridium difficile   Hypothyroidism   GERD (gastroesophageal reflux disease)   HLD (hyperlipidemia)   Sepsis   Tibial fracture    Time spent: 30 minutes.     Barton Dubois  [facility] Pager [phone]. If 7PM-7AM, please contact night-coverage at [email or web address], password [facility] 04/04/2015, 4:31 PM  LOS: 7 days

## 2015-04-04 NOTE — Clinical Social Work Note (Signed)
CSW was able to reach patient's son. He states he will call CSW in the morning to give CSW decision on which facility he wants the patient to go to. Son is aware patient will DC 7/5.  Roddie Mc MSW, [place], [place], [phone]

## 2015-04-05 DIAGNOSIS — E43 Unspecified severe protein-calorie malnutrition: Secondary | ICD-10-CM | POA: Diagnosis not present

## 2015-04-05 DIAGNOSIS — S82201A Unspecified fracture of shaft of right tibia, initial encounter for closed fracture: Secondary | ICD-10-CM | POA: Diagnosis not present

## 2015-04-05 DIAGNOSIS — R197 Diarrhea, unspecified: Secondary | ICD-10-CM | POA: Diagnosis not present

## 2015-04-05 DIAGNOSIS — J45909 Unspecified asthma, uncomplicated: Secondary | ICD-10-CM | POA: Diagnosis not present

## 2015-04-05 DIAGNOSIS — R41841 Cognitive communication deficit: Secondary | ICD-10-CM | POA: Diagnosis not present

## 2015-04-05 DIAGNOSIS — M6281 Muscle weakness (generalized): Secondary | ICD-10-CM | POA: Diagnosis not present

## 2015-04-05 DIAGNOSIS — S82892A Other fracture of left lower leg, initial encounter for closed fracture: Secondary | ICD-10-CM | POA: Diagnosis not present

## 2015-04-05 DIAGNOSIS — F411 Generalized anxiety disorder: Secondary | ICD-10-CM | POA: Diagnosis not present

## 2015-04-05 DIAGNOSIS — I509 Heart failure, unspecified: Secondary | ICD-10-CM | POA: Diagnosis not present

## 2015-04-05 DIAGNOSIS — F1910 Other psychoactive substance abuse, uncomplicated: Secondary | ICD-10-CM

## 2015-04-05 DIAGNOSIS — E039 Hypothyroidism, unspecified: Secondary | ICD-10-CM | POA: Diagnosis not present

## 2015-04-05 DIAGNOSIS — A047 Enterocolitis due to Clostridium difficile: Secondary | ICD-10-CM | POA: Diagnosis not present

## 2015-04-05 DIAGNOSIS — S82101D Unspecified fracture of upper end of right tibia, subsequent encounter for closed fracture with routine healing: Secondary | ICD-10-CM | POA: Diagnosis not present

## 2015-04-05 DIAGNOSIS — G629 Polyneuropathy, unspecified: Secondary | ICD-10-CM

## 2015-04-05 DIAGNOSIS — F419 Anxiety disorder, unspecified: Secondary | ICD-10-CM | POA: Diagnosis not present

## 2015-04-05 DIAGNOSIS — J189 Pneumonia, unspecified organism: Secondary | ICD-10-CM | POA: Diagnosis not present

## 2015-04-05 DIAGNOSIS — S82191D Other fracture of upper end of right tibia, subsequent encounter for closed fracture with routine healing: Secondary | ICD-10-CM | POA: Diagnosis not present

## 2015-04-05 DIAGNOSIS — R0789 Other chest pain: Secondary | ICD-10-CM | POA: Diagnosis not present

## 2015-04-05 DIAGNOSIS — M79606 Pain in leg, unspecified: Secondary | ICD-10-CM | POA: Diagnosis not present

## 2015-04-05 DIAGNOSIS — G9340 Encephalopathy, unspecified: Secondary | ICD-10-CM | POA: Diagnosis not present

## 2015-04-05 DIAGNOSIS — F341 Dysthymic disorder: Secondary | ICD-10-CM | POA: Diagnosis not present

## 2015-04-05 DIAGNOSIS — E119 Type 2 diabetes mellitus without complications: Secondary | ICD-10-CM | POA: Diagnosis not present

## 2015-04-05 DIAGNOSIS — F17200 Nicotine dependence, unspecified, uncomplicated: Secondary | ICD-10-CM | POA: Diagnosis not present

## 2015-04-05 DIAGNOSIS — S82192D Other fracture of upper end of left tibia, subsequent encounter for closed fracture with routine healing: Secondary | ICD-10-CM | POA: Diagnosis not present

## 2015-04-05 DIAGNOSIS — R269 Unspecified abnormalities of gait and mobility: Secondary | ICD-10-CM | POA: Diagnosis not present

## 2015-04-05 DIAGNOSIS — A419 Sepsis, unspecified organism: Secondary | ICD-10-CM | POA: Diagnosis not present

## 2015-04-05 DIAGNOSIS — S82202A Unspecified fracture of shaft of left tibia, initial encounter for closed fracture: Secondary | ICD-10-CM

## 2015-04-05 DIAGNOSIS — F329 Major depressive disorder, single episode, unspecified: Secondary | ICD-10-CM | POA: Diagnosis not present

## 2015-04-05 DIAGNOSIS — K5090 Crohn's disease, unspecified, without complications: Secondary | ICD-10-CM | POA: Diagnosis not present

## 2015-04-05 LAB — GLUCOSE, CAPILLARY
Glucose-Capillary: 69 mg/dL (ref 65–99)
Glucose-Capillary: 95 mg/dL (ref 65–99)

## 2015-04-05 LAB — CBC
HCT: 28.2 % — ABNORMAL LOW (ref 36.0–46.0)
Hemoglobin: 9.1 g/dL — ABNORMAL LOW (ref 12.0–15.0)
MCH: 30.7 pg (ref 26.0–34.0)
MCHC: 32.3 g/dL (ref 30.0–36.0)
MCV: 95.3 fL (ref 78.0–100.0)
PLATELETS: 191 10*3/uL (ref 150–400)
RBC: 2.96 MIL/uL — ABNORMAL LOW (ref 3.87–5.11)
RDW: 19 % — ABNORMAL HIGH (ref 11.5–15.5)
WBC: 14.9 10*3/uL — AB (ref 4.0–10.5)

## 2015-04-05 MED ORDER — SACCHAROMYCES BOULARDII 250 MG PO CAPS: 250.0000 mg | ORAL_CAPSULE | Freq: Two times a day (BID) | ORAL | Status: DC

## 2015-04-05 MED ORDER — OXYCODONE-ACETAMINOPHEN 5-325 MG PO TABS: 1.0000 | ORAL_TABLET | Freq: Four times a day (QID) | ORAL | Status: DC | PRN

## 2015-04-05 MED ORDER — METOPROLOL TARTRATE 25 MG PO TABS
12.5000 mg | ORAL_TABLET | Freq: Two times a day (BID) | ORAL | Status: DC
Start: 2015-04-05 — End: 2016-02-29

## 2015-04-05 MED ORDER — LACTULOSE 10 GM/15ML PO SOLN: 10.0000 g | Freq: Every day | ORAL | Status: DC | PRN

## 2015-04-05 MED ORDER — FAMOTIDINE 40 MG PO TABS: 40.0000 mg | ORAL_TABLET | Freq: Every day | ORAL | Status: DC

## 2015-04-05 MED ORDER — VANCOMYCIN 50 MG/ML ORAL SOLUTION: ORAL | Status: AC

## 2015-04-05 MED ORDER — NICOTINE 21 MG/24HR TD PT24: 21.0000 mg | MEDICATED_PATCH | TRANSDERMAL | Status: DC

## 2015-04-05 MED ORDER — HYDROMORPHONE HCL 2 MG PO TABS: 2.0000 mg | ORAL_TABLET | Freq: Four times a day (QID) | ORAL | Status: DC | PRN

## 2015-04-05 MED ORDER — LACTULOSE 10 GM/15ML PO SOLN: 10.0000 g | Freq: Every day | ORAL | Status: DC

## 2015-04-05 MED ORDER — GABAPENTIN 100 MG PO CAPS: 100.0000 mg | ORAL_CAPSULE | Freq: Two times a day (BID) | ORAL | Status: DC

## 2015-04-05 MED ORDER — APIXABAN 2.5 MG PO TABS: 2.5000 mg | ORAL_TABLET | Freq: Two times a day (BID) | ORAL | Status: DC

## 2015-04-05 NOTE — Clinical Social Work Placement (Addendum)
   CLINICAL SOCIAL WORK PLACEMENT  NOTE  Date:  04/05/2015  Patient Details  Name: [name] MRN: [id number] Date of Birth: 16-Dec-1960  Clinical Social Work is seeking post-discharge placement for this patient at the Skilled  Nursing Facility level of care (*CSW will initial, date and re-position this form in  chart as items are completed):  Yes   Patient/family provided with [place] Clinical Social Work Department's list of facilities offering this level of care within the geographic area requested by the patient (or if unable, by the patient's family).  Yes   Patient/family informed of their freedom to choose among providers that offer the needed level of care, that participate in Medicare, Medicaid or managed care program needed by the patient, have an available bed and are willing to accept the patient.  Yes   Patient/family informed of [place]'s ownership interest in [facility] and [facility], as well as of the fact that they are under no obligation to receive care at these facilities.  PASRR submitted to EDS on       PASRR number received on       Existing PASRR number confirmed on 04/01/15     FL2 transmitted to all facilities in geographic area requested by pt/family on 04/01/15     FL2 transmitted to all facilities within larger geographic area on       Patient informed that his/her managed care company has contracts with or will negotiate with certain facilities, including the following:        Yes   Patient/family informed of bed offers received.  Patient chooses bed at [facility]     Physician recommends and patient chooses bed at      Patient to be transferred to [facility] on 04/05/15.  Patient to be transferred to facility by [facility]bul[facility]     Patient family notified on 04/05/15 of transfer.  Name of family member notified:  Edward [place]     PHYSICIAN Please prepare priority discharge summary,  including medications, Please prepare prescriptions, Please sign FL2     Additional Comment:   Per MD patient ready for DC to [facility] in [place]. RN, patient, patient's family, and facility notified of DC. RN given number for report. DC packet on chart. Ambulance transport will be requested by RN once PICC has been removed. CSW signing off.  _______________________________________________ Roddie Mc MSW, [place], [place], [phone]

## 2015-04-05 NOTE — Progress Notes (Signed)
Nsg Discharge Note  Admit Date:  03/28/2015 Discharge date: 04/05/2015   CARLINE DURA to be D/C'd Nursing Home per MD order.  AVS completed.  Copy for chart, and copy for patient signed, and dated. Patient/caregiver able to verbalize understanding.  Discharge Medication:   Medication List    STOP taking these medications        pantoprazole 20 MG tablet  Commonly known as:  PROTONIX      TAKE these medications        apixaban 2.5 MG Tabs tablet  Commonly known as:  ELIQUIS  Take 1 tablet (2.5 mg total) by mouth 2 (two) times daily.     calcium carbonate 1250 (500 CA) MG tablet  Commonly known as:  OS-CAL - dosed in mg of elemental calcium  Take 1 tablet (500 mg of elemental calcium total) by mouth daily with breakfast.     famotidine 40 MG tablet  Commonly known as:  PEPCID  Take 1 tablet (40 mg total) by mouth daily.     feeding supplement (RESOURCE BREEZE) Liqd  Take 1 Container by mouth 3 (three) times daily between meals.     folic acid 1 MG tablet  Commonly known as:  FOLVITE  Take 1 tablet (1 mg total) by mouth daily.     gabapentin 100 MG capsule  Commonly known as:  NEURONTIN  Take 1 capsule (100 mg total) by mouth 2 (two) times daily.     HYDROmorphone 2 MG tablet  Commonly known as:  DILAUDID  Take 1 tablet (2 mg total) by mouth every 6 (six) hours as needed for severe pain.     lactulose 10 GM/15ML solution  Commonly known as:  CHRONULAC  Take 15 mLs (10 g total) by mouth daily as needed for moderate constipation.     levothyroxine 25 MCG tablet  Commonly known as:  SYNTHROID, LEVOTHROID  Take 1 tablet (25 mcg total) by mouth daily before breakfast.     meclizine 25 MG tablet  Commonly known as:  ANTIVERT  Take 25 mg by mouth 3 (three) times daily as needed for dizziness.     metoprolol tartrate 25 MG tablet  Commonly known as:  LOPRESSOR  Take 0.5 tablets (12.5 mg total) by mouth 2 (two) times daily.     nicotine 21 mg/24hr patch  Commonly  known as:  NICODERM CQ - dosed in mg/24 hours  Place 1 patch (21 mg total) onto the skin daily.     ondansetron 4 MG tablet  Commonly known as:  ZOFRAN  Take 4 mg by mouth every 8 (eight) hours as needed for nausea or vomiting.     oxyCODONE-acetaminophen 5-325 MG per tablet  Commonly known as:  PERCOCET/ROXICET  Take 1 tablet by mouth every 6 (six) hours as needed for moderate pain.     rosuvastatin 20 MG tablet  Commonly known as:  CRESTOR  Take 1 tablet (20 mg total) by mouth daily.     saccharomyces boulardii 250 MG capsule  Commonly known as:  FLORASTOR  Take 1 capsule (250 mg total) by mouth 2 (two) times daily.     thiamine 100 MG tablet  Commonly known as:  VITAMIN B-1  Take 1 tablet (100 mg total) by mouth daily.     traZODone 25 mg Tabs tablet  Commonly known as:  DESYREL  Take 0.5 tablets (25 mg total) by mouth at bedtime as needed for sleep.     vancomycin 50 mg/mL oral  solution  Commonly known as:  VANCOCIN  125 mg PO four times daily, qty sufficient for 12 days     venlafaxine 25 MG tablet  Commonly known as:  EFFEXOR  Take 1 tablet (25 mg total) by mouth 2 (two) times daily.        Discharge Assessment: Filed Vitals:   04/05/15 1435  BP: 132/85  Pulse: 95  Temp: 99 F (37.2 C)  Resp: 20   Skin clean, dry and intact without evidence of skin break down, no evidence of skin tears noted. IV catheter discontinued intact. Site without signs and symptoms of complications - no redness or edema noted at insertion site, patient denies c/o pain - only slight tenderness at site.  Dressing with slight pressure applied.  D/c Instructions-Education: Discharge instructions given to patient/family with verbalized understanding. D/c education completed with patient/family including follow up instructions, medication list, d/c activities limitations if indicated, with other d/c instructions as indicated by MD - patient able to verbalize understanding, all questions  fully answered. Leg immobilizers intact, no skin issues noted.  Patient instructed to return to ED, call 911, or call MD for any changes in condition.  Patient escorted via EMS, report called into [place].  Kern Reap, RN 04/05/2015 5:11 PM

## 2015-04-05 NOTE — Progress Notes (Signed)
Pt. Refused blood sugar after multiple attempts made.

## 2015-04-05 NOTE — Clinical Social Work Note (Signed)
CSW received voicemail from patient's son requesting patient be sent to a different facility. CSW explained that the patient's discharge has already be facilitated for [facility] in [place] and that the patient will be going to this facility today. CSW explained that the patient could be moved from the facility at a later time but that DC would not be stopped for this. The son would like the patient to DC to [facility]. CSW explained that Sonny Dandy would have needed DC summary by 4:00PM to do an admission for today. Patient's son verbalizes understanding. Charge RN updated CSW signing off at this time.  Roddie Mc MSW, [place], [place], [phone]

## 2015-04-05 NOTE — Care Management (Signed)
Important Message  Patient Details  Name: JULLIETTE FRENTZ MRN: [id number] Date of Birth: October 25, 1960   Medicare Important Message Given:  Yes-third notification given    Lawerance Sabal, RN 04/05/2015, 8:08 AM

## 2015-04-05 NOTE — Discharge Summary (Signed)
Physician Discharge Summary  [name] VOZ:[id number] DOB: 10-13-1960 DOA: 03/28/2015  PCP: Lottie Dawson, MD  Admit date: 03/28/2015 Discharge date: 04/05/2015  Time spent: >30 minutes  Recommendations for Outpatient Follow-up:  Take medications as prescribed Please follow with orthopedic service (Dr. Ninfa Linden) in 1 week Maintain adequate hydration and encourage PO intake/feeding supplements CBC in 1 week to follow Hgb trend BMET in 1 week to follow electrolytes and renal function   Discharge Diagnoses:  Principal Problem:   Fracture, tibia Active Problems:   DM2 (diabetes mellitus, type 2)   Anxiety state   Depression   Peripheral neuropathy   Asthma   Normocytic anemia   Nicotine abuse   History of pulmonary embolus (PE)   Chest pain, musculoskeletal   Severe protein-calorie malnutrition   Fall   Enteritis due to Clostridium difficile   Hypothyroidism   GERD (gastroesophageal reflux disease)   HLD (hyperlipidemia)   Sepsis   Tibial fracture   Discharge Condition: stable and improved. Discharge to SNF for rehabilitation.  Diet recommendation: low carbohydrates diet   Filed Weights   03/28/15 1939 03/29/15 0023  Weight: 45.813 kg (101 lb) 41.504 kg (91 lb 8 oz)    History of present illness:  54 y.o. female with PMH of hypertension, hyperlipidemia, diet-controlled diabetes mellitus, GERD, hypothyroidism, depression, chronic dizziness, history of PE on Eliquis, pancreatitis, asthma, recently diagnosed C. difficile colitis (C. difficile PCR positive on 03/21/15), who presents with bilateral knee pain and foot pain, and chest wall pain after fall.  Patient states that her son who is helping her in her home, dropped her on Saturday. She reports that she was in a "pretzel position" and landed on her legs. She reports she has terrible pain in both of her knees and feet, and can't use them. She reports she also has pain over her frontal chest wall. She does  not have shortness of breath or coughing.   Of note, patient was recently hospitalized from 6/20 to 03/24/15 because of abdominal pain and diarrhea. She had positive C. difficile PCR on 03/21/15. She was discharged on oral vancomycin for 12 days (total of 14 days of Abx), but she has not been taking this medication due to financially difficulty. She has mild fever, no chills. No symptoms of UTI.  Hospital Course:   Bilateral tibia fracture Continue with bledsoe bilaterally Conservative management per ortho.  Will use eliquis prophylaxis dose (2.40m BID) Follow Hgb trend very closely Appreciate Dr BNinfa Lindenassistance.   Anemia; acute blood loss.; Received 2 units PRBC hb went up to 10 and is now 8.8-9. CT Abdomen showed: Increased density in the subcutaneous tissues of both flanks. While this may reflect edema, in the setting of decreased blood counts, soft tissue hemorrhage is considered. Continue to monitor hgb trend Eliquis resumed  Tachycardia: unclear etiology; but appears to be secondary to dehydration and anemia -neg PE on CTA -hb stable.  -patient advise to maintain good hydration and nutrition  HTN Will use metoprolol low dose -that medication would also help HR  Encephalopathy;  Patient agitated at times,  B12 and TSH WNL Ammonia level now within normal limits   Chest pain:  Rib X ray: Right fifth through eighth anterior lateral rib fractures.  No Pneumothorax. Continue Incentive spirometry and PRN analgesics  History of PE; diagnosed 05-2014. eliquis was discontinue 11-2014. Resume during last admission in June.  -Due to acute blood loss and severe anemia, eliquis was initially on hold  -CTA demonstarted no  PE -will use eliquis prophylaxis dose only; resume at discharge  DVT prophylaxis. repeated doppler LE and are negative for DVT.  eliquis for DVT prophylaxis has been added (2.5 mg twice a day)  C. difficile colitis and sepsis Diagnosed last  admission. Never finished treatment. Was not able to afford medication.  Continue with oral vancomycin Will add florastor  DM-II: Last A1c 4.9 on 05/08/14, well controled. Patient is not taking med at home. -will recommend low carbohydrates for CBG control -A1C 6.0 -experiencing low CBG's when receiving SSI -diet change to regular, patient was not eating.  Hypothyroidism:  -Continue home Synthroid -TSH 1.3  Increase bilirubin/ALK phosphatase Resolved now Most likely associated with fractures Ok to resume statins  Encephalopathy AAOX3, able to follow commands and cooperative with treatment and staff ammonia level WNL at discharge  Thrombocytopenia; improving.  -due to sepsis -will monitor  Hypokalemia and Hypernatremia;  Resolved with IVF's and potassium repletion Monitor and replete as needed  Procedures:  See below for x-ray reports   Consultations:  Orthopedic service (Dr. Ninfa Linden)  Discharge Exam: Filed Vitals:   04/05/15 1435  BP: 132/85  Pulse: 95  Temp: 99 F (37.2 C)  Resp: 20    General: AAOX2, able to follow commands and cooperative. No fever, no SOB. Pain well controlled with PO meds. Diarrhea improved  Cardiovascular: S1, S2; tachycardic, no JVD  Respiratory: CTA bilaterally  Abdomen: BS present, soft, nt, no guarding  Musculoskeletal: trace to 1+ edema bilaterally, decrease range of motion and tenderness to palpation on both legs. bedsoe braces in place   Discharge Instructions   Discharge Instructions    Diet - low sodium heart healthy    Complete by:  As directed      Discharge instructions    Complete by:  As directed   Take medications as prescribed Please follow with orthopedic service (Dr. Ninfa Linden) in 1 week Maintain adequate hydration and encourage PO intake/feeding supplements CBC in 1 week to follow Hgb trend BMET in 1 week to follow electrolytes and renal function (repeat as tolerated)          Current Discharge  Medication List    START taking these medications   Details  famotidine (PEPCID) 40 MG tablet Take 1 tablet (40 mg total) by mouth daily.    HYDROmorphone (DILAUDID) 2 MG tablet Take 1 tablet (2 mg total) by mouth every 6 (six) hours as needed for severe pain. Qty: 30 tablet, Refills: 0    lactulose (CHRONULAC) 10 GM/15ML solution Take 15 mLs (10 g total) by mouth daily as needed for moderate constipation. Qty: 240 mL, Refills: 0    metoprolol tartrate (LOPRESSOR) 25 MG tablet Take 0.5 tablets (12.5 mg total) by mouth 2 (two) times daily.    nicotine (NICODERM CQ - DOSED IN MG/24 HOURS) 21 mg/24hr patch Place 1 patch (21 mg total) onto the skin daily. Qty: 28 patch, Refills: 0    oxyCODONE-acetaminophen (PERCOCET/ROXICET) 5-325 MG per tablet Take 1 tablet by mouth every 6 (six) hours as needed for moderate pain. Qty: 30 tablet, Refills: 0      CONTINUE these medications which have CHANGED   Details  apixaban (ELIQUIS) 2.5 MG TABS tablet Take 1 tablet (2.5 mg total) by mouth 2 (two) times daily.    gabapentin (NEURONTIN) 100 MG capsule Take 1 capsule (100 mg total) by mouth 2 (two) times daily.    vancomycin (VANCOCIN) 50 mg/mL oral solution 125 mg PO four times daily, qty sufficient for  12 days Qty: 5 mL, Refills: 0      CONTINUE these medications which have NOT CHANGED   Details  calcium carbonate (OS-CAL - DOSED IN MG OF ELEMENTAL CALCIUM) 1250 (500 CA) MG tablet Take 1 tablet (500 mg of elemental calcium total) by mouth daily with breakfast. Qty: 30 tablet, Refills: 0    feeding supplement, RESOURCE BREEZE, (RESOURCE BREEZE) LIQD Take 1 Container by mouth 3 (three) times daily between meals. Refills: 0    folic acid (FOLVITE) 1 MG tablet Take 1 tablet (1 mg total) by mouth daily. Qty: 30 tablet, Refills: 0    levothyroxine (SYNTHROID, LEVOTHROID) 25 MCG tablet Take 1 tablet (25 mcg total) by mouth daily before breakfast. Qty: 30 tablet, Refills: 0    meclizine  (ANTIVERT) 25 MG tablet Take 25 mg by mouth 3 (three) times daily as needed for dizziness.    ondansetron (ZOFRAN) 4 MG tablet Take 4 mg by mouth every 8 (eight) hours as needed for nausea or vomiting.    rosuvastatin (CRESTOR) 20 MG tablet Take 1 tablet (20 mg total) by mouth daily. Qty: 30 tablet, Refills: 0    thiamine (VITAMIN B-1) 100 MG tablet Take 1 tablet (100 mg total) by mouth daily. Qty: 30 tablet, Refills: 0    traZODone (DESYREL) 25 mg TABS tablet Take 0.5 tablets (25 mg total) by mouth at bedtime as needed for sleep. Qty: 30 tablet, Refills: 0    venlafaxine (EFFEXOR) 25 MG tablet Take 1 tablet (25 mg total) by mouth 2 (two) times daily. Qty: 30 tablet, Refills: 0      STOP taking these medications     pantoprazole (PROTONIX) 20 MG tablet        Allergies  Allergen Reactions  . Iohexol Other (See Comments)    "severe burning" Patient has received Contrast in 2005 with 13 hour pre-medication, and had no reaction at that time  . Spiriva Handihaler [Tiotropium Bromide Monohydrate] Nausea And Vomiting  . Ativan [Lorazepam] Other (See Comments)    hallucinations  . Penicillins Hives   Follow-up Information    Follow up with Mcarthur Rossetti, MD. Schedule an appointment as soon as possible for a visit in 1 week.   Specialty:  Orthopedic Surgery   Contact information:   [address] [phone]        The results of significant diagnostics from this hospitalization (including imaging, microbiology, ancillary and laboratory) are listed below for reference.    Significant Diagnostic Studies: Ct Abdomen Pelvis Wo Contrast  03/29/2015   CLINICAL DATA:  Anemia, decreased hemoglobin. Evaluate for intra-abdominal bleeding. Patient was on Eliquis.  EXAM: CT ABDOMEN AND PELVIS WITHOUT CONTRAST  TECHNIQUE: Multidetector CT imaging of the abdomen and pelvis was performed following the standard protocol without IV contrast.  COMPARISON:   Contrast enhanced CT 02/28/2015  FINDINGS: The included lung bases are clear.  Hypodense lesion in the dome of the right lobe of the liver, 2.1 x 1.8 cm, unchanged from prior exam. Mild focal fatty infiltration adjacent with falciform ligament. There is otherwise homogeneous attenuation throughout the hepatic parenchyma. Gallbladder is decompressed. Dilatation of the distal common bile duct measuring 10 mm, not significantly changed. Spleen is small in size with homogeneous attenuation. Pancreatic atrophy without surrounding inflammation. No adrenal nodule. There is mild right renal atrophy, punctate calcifications in the upper and lower right kidney. Punctate calcifications throughout the left kidney, no hydronephrosis.  No retroperitoneal fluid or hemorrhage. The abdominal aorta is  normal in caliber with atherosclerosis.  Stomach is decompressed. Limited bowel assessment. No definite dilated or thickened bowel loops. The appendix is normal. Small volume of colonic stool. No free air, free fluid, or intra-abdominal fluid collection.  Increased density in the subcutaneous tissues of the bilateral flank soft tissues.  Urinary bladder is physiologically distended.  No pelvic free fluid.  The bones are under mineralized. Minimal compression deformity superior endplate of F16, unchanged from prior. Bone island in the left iliac bone. No fracture of the bony pelvis or lumbar spine.  IMPRESSION: 1. Increased density in the subcutaneous tissues of both flanks. While this may reflect edema, in the setting of decreased blood counts, soft tissue hemorrhage is considered. 2. No findings of intra-abdominal hemorrhage or traumatic injury, allowing for lack of contrast.   Electronically Signed   By: Jeb Levering M.D.   On: 03/29/2015 02:25   Dg Chest 2 View  03/21/2015   CLINICAL DATA:  Recent rib fracture, back gave out this morning when getting out of bed, back and chest pain, history hypertension, asthma, pulmonary  embolism, diabetes mellitus, Crohn's disease, smoker  EXAM: CHEST  2 VIEW  COMPARISON:  03/03/2015  FINDINGS: Normal heart size, mediastinal contours, and pulmonary vascularity.  Chronic central peribronchial thickening with pulmonary hyperinflation question related to asthma.  No acute infiltrate, pleural effusion or pneumothorax.  Bones appear demineralized.  IMPRESSION: Hyperinflation with chronic central peribronchial thickening which could be related to history of asthma.  No acute infiltrate.   Electronically Signed   By: Lavonia Dana M.D.   On: 03/21/2015 13:07   Dg Ribs Bilateral W/chest  03/29/2015   CLINICAL DATA:  Suspected rib fractures. Bilateral anterior chest and rib pain.  EXAM: BILATERAL RIBS AND CHEST - 4+ VIEW  COMPARISON:  03/21/2015  FINDINGS: There are fractures through the anterior lateral right fifth through eighth ribs. No pneumothorax. No visible left rib fractures. Heart is normal size. Lungs are clear. No effusions.  IMPRESSION: Right fifth through eighth anterior lateral rib fractures. No pneumothorax.   Electronically Signed   By: Rolm Baptise M.D.   On: 03/29/2015 15:11   Ct Head Wo Contrast  03/31/2015   CLINICAL DATA:  Confusion.  EXAM: CT HEAD WITHOUT CONTRAST  TECHNIQUE: Contiguous axial images were obtained from the base of the skull through the vertex without intravenous contrast.  COMPARISON:  02/28/2015  FINDINGS: Since the prior head CT, the patient has developed areas of extensive hypoattenuation symmetrically throughout the subcortical and deep cerebral white matter bilaterally. There is no significant associated mass effect. No definite underlying mass is identified. No acute intracranial hemorrhage, midline shift, or extra-axial fluid collection is seen. Patchy hypodensities are present in the cerebellum as well. There is no definite evidence of acute large territory cortical infarct.  Orbits are unremarkable.  IMPRESSION: Extensive areas of hypodensity throughout the  cerebral white matter and patchy involvement of the cerebellum, new from the recent prior CT. No significant mass effect. Brain MRI (without and with IV contrast) is recommended for further evaluation, with possible considerations including encephalitis, fulminant demyelination, and toxic and metabolic leukoencephalopathies.  These results will be called to the ordering clinician or representative by the Radiologist Assistant, and communication documented in the PACS or zVision Dashboard.   Electronically Signed   By: Logan Bores   On: 03/31/2015 21:48   Ct Angio Chest Pe W/cm &/or Wo Cm  03/31/2015   CLINICAL DATA:  History of previous pulmonary embolus, tachycardia  EXAM:  CT ANGIOGRAPHY CHEST WITH CONTRAST  TECHNIQUE: Multidetector CT imaging of the chest was performed using the standard protocol during bolus administration of intravenous contrast. Multiplanar CT image reconstructions and MIPs were obtained to evaluate the vascular anatomy.  CONTRAST:  59m OMNIPAQUE IOHEXOL 350 MG/ML SOLN  COMPARISON:  02/28/2015, 05/08/2014  FINDINGS: The lungs are well aerated bilaterally. A mild amount of left basilar atelectasis with associated small effusion is seen. The right lung is clear. A right-sided PICC line is noted. The thoracic inlet is within normal limits. The thoracic aorta and its branches are unremarkable with the exception of a bovine arch anomaly. Heavy coronary calcifications are seen.  Pulmonary artery shows a normal branching pattern. No filling defects to suggest pulmonary emboli are identified. The previously seen emboli have resolved the interval. No significant hilar or mediastinal adenopathy is noted.  Scanning into the upper abdomen demonstrates mild ascites surrounding the liver. No other definitive abnormality is seen. The osseous structures show evidence of prior rib fractures of the right sixth rib posterior laterally. No left-sided rib fractures are seen.  Review of the MIP images confirms  the above findings.  IMPRESSION: No evidence of pulmonary emboli.  Right sixth rib fracture this is new from the recent exam from 1 month ago.  Mild left basilar atelectasis and small effusion.  Mild ascites.   Electronically Signed   By: MInez CatalinaM.D.   On: 03/31/2015 21:18   Mr Lumbar Spine Wo Contrast  03/21/2015   CLINICAL DATA:  Back pain and weakness progressed over 2 days. Fall 3 weeks ago.  EXAM: MRI LUMBAR SPINE WITHOUT CONTRAST  TECHNIQUE: Multiplanar, multisequence MR imaging of the lumbar spine was performed. No intravenous contrast was administered.  COMPARISON:  CT of the chest, abdomen and pelvis Feb 28, 2015  FINDINGS: Lumbar vertebral bodies and posterior elements are intact and aligned with maintenance of lumbar lordosis. Using the reference level of the last well-formed intervertebral disc as L5-S1, intervertebral disc morphology is preserved, decreased T2 signal within the L4-5 disc, to lesser extent remaining lumbar disc consistent with mild to moderate desiccation. Very mild chronic discogenic endplate change at LU7-2 No STIR signal abnormality to suggest acute osseous process.  Mild T12 superior endplate compression fracture with less than 15% height loss, present on prior CT. Conus medullaris terminates at L1 appears normal morphology and signal characteristics. Cauda equina appears normal though, limited assessment due to mild patient motion. Symmetric mildly atrophic iliopsoas muscles.  Level by level evaluation:  L1-2, L2-3, L3-4: No disc bulge. Mild facet arthropathy and ligamentum flavum redundancy L3-4 without canal stenosis or neural foraminal narrowing.  L4-5: 2 mm broad-based disc bulge with central annular fissure. Mild facet arthropathy and ligamentum flavum redundancy without canal stenosis or neural foraminal narrowing.  L5-S1: No disc bulge. Mild facet arthropathy and ligamentum flavum redundancy without canal stenosis or neural foraminal narrowing.  IMPRESSION: No acute  fracture nor malalignment.  Small L4-5 broad-based disc bulge and annular fissure without canal stenosis or neural foraminal narrowing at any lumbar level.  Symmetrically atrophic iliopsoas muscles without MR findings to denervation.   Electronically Signed   By: CElon AlasM.D.   On: 03/21/2015 23:17   Dg Knee Complete 4 Views Left  03/28/2015   CLINICAL DATA:  Left knee pain. Patient reports being dropped by her son. Blood infection.  EXAM: LEFT KNEE - COMPLETE 4+ VIEW  COMPARISON:  None.  FINDINGS: Comminuted fracture of the proximal tibia primarily involving the metaphysis.  No definite involvement of the articular surface or joint effusion/ lipohemarthrosis to suspect intra-articular extension. There is a minimally displaced proximal fibular neck fracture. 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  IMPRESSION: 1. Comminuted fracture of the proximal tibial metaphysis. No radiographic findings of intra-articular extension. 2. Mildly displaced fibular neck fracture. 3. 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   Electronically Signed   By: Jeb Levering M.D.   On: 03/28/2015 20:47   Dg Knee Complete 4 Views Right  03/28/2015   CLINICAL DATA:  Right knee pain. Patient reports being dropped by her son. Blood infection.  EXAM: RIGHT KNEE - COMPLETE 4+ VIEW  COMPARISON:  None.  FINDINGS: There is a comminuted mildly displaced fracture of the proximal tibial metaphysis. Proximal fractional involves the posterior medial cortex, distal fracture line the anterior lateral cortex. There is no definite extension to the articular surface. The bones are diffusely under mineralized. Probable osteonecrosis of the medial and lateral femoral condyles. No joint effusion or lipohemarthrosis.  IMPRESSION: 1. Comminuted mildly displaced fracture of the proximal tibial metaphysis. No definite intra-articular extension. 2. Diffuse bony under mineralization with osteonecrosis of the femoral  condyles.   Electronically Signed   By: Jeb Levering M.D.   On: 03/28/2015 20:44   Dg Foot Complete Left  03/28/2015   CLINICAL DATA:  Stated history of knee pain and blood infection. Patient reports being dropped by her son, lower leg swelling.  EXAM: LEFT FOOT - COMPLETE 3+ VIEW  COMPARISON:  None.  FINDINGS: Diffuse bony under mineralization. No fracture or dislocation. The alignment and joint spaces are maintained. No erosions or periosteal reaction. Question bone infarct in the calcaneus and distal tibia. Mild soft tissue edema.  IMPRESSION: Diffuse bony under mineralization and probable bone infarcts in the calcaneus and distal tibia. No acute osseous abnormality is seen.   Electronically Signed   By: Jeb Levering M.D.   On: 03/28/2015 20:39   Dg Foot Complete Right  03/28/2015   CLINICAL DATA:  Stated history of knee pain and blood infection. Patient reports being dropped by her son, lower leg swelling.  EXAM: RIGHT FOOT COMPLETE - 3+ VIEW  COMPARISON:  No prior exams. Please note the lateral foot radiograph is associated with the right knee exam performed concurrently.  FINDINGS: The bones are under mineralized. No fracture or dislocation. The alignment and joint spaces are maintained. Probable bone infarcts involving the calcaneus and distal tibia. Avascular necrosis suspected of the second metatarsal head. No erosion or periosteal reaction. Mild diffuse soft tissue edema.  IMPRESSION: Diffuse bony under mineralization with probable bone infarcts in the distal tibia and calcaneus. 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 No definite acute bony abnormality.   Electronically Signed   By: Jeb Levering M.D.   On: 03/28/2015 20:42    Microbiology: Recent Results (from the past 240 hour(s))  Culture, blood (routine x 2)     Status: None   Collection Time: 03/29/15  2:39 AM  Result Value Ref Range Status   Specimen Description BLOOD LEFT ARM  Final   Special Requests IN  PEDIATRIC BOTTLE [place]  Final   Culture NO GROWTH 5 DAYS  Final   Report Status 04/03/2015 FINAL  Final  Culture, blood (routine x 2)     Status: None   Collection Time: 03/29/15  2:46 AM  Result Value Ref Range Status   Specimen Description BLOOD LEFT ARM  Final   Special Requests IN PEDIATRIC  BOTTLE 1CC  Final   Culture NO GROWTH 5 DAYS  Final   Report Status 04/03/2015 FINAL  Final     Labs: Basic Metabolic Panel:  Recent Labs Lab 03/30/15 1200 03/31/15 0535 04/01/15 0535 04/02/15 0455 04/03/15 0600  NA 146* 140 141 143 144  K 3.3* 4.0 3.4* 2.5* 4.3  CL 112* 112* 116* 116* 118*  CO2 25 22 20* 20* 19*  GLUCOSE 200* 324* 279* 68 77  BUN <5* <5* 10 <5* <5*  CREATININE 0.71 0.49 0.59 0.43* 0.46  CALCIUM 7.3* 6.1* 7.1* 7.0* 7.1*   Liver Function Tests:  Recent Labs Lab 04/03/15 0600  AST 27  ALT 17  ALKPHOS 111  BILITOT 0.4  PROT 3.5*  ALBUMIN 1.3*    Recent Labs Lab 03/31/15 1546 04/03/15 0610  AMMONIA 50* 21   CBC:  Recent Labs Lab 03/31/15 0535 04/01/15 0535 04/03/15 0600 04/04/15 0500 04/05/15 0448  WBC 10.5 5.9 10.5 11.5* 14.9*  HGB 11.2* 10.8* 8.0* 8.8* 9.1*  HCT 33.4* 32.2* 24.3* 27.3* 28.2*  MCV 90.8 90.2 91.7 92.5 95.3  PLT 72* 48* 59* 111* 191   BNP (last 3 results)  Recent Labs  12/28/14 1905  BNP 77.5    ProBNP (last 3 results)  Recent Labs  07/26/14 1138  PROBNP 440.7*    CBG:  Recent Labs Lab 04/04/15 1208 04/04/15 1651 04/04/15 2212 04/05/15 0812 04/05/15 1153  GLUCAP 74 73 66 69 95    Signed:  Barton Dubois  [facility] 04/05/2015, 3:16 PM

## 2015-04-12 DIAGNOSIS — E039 Hypothyroidism, unspecified: Secondary | ICD-10-CM | POA: Diagnosis not present

## 2015-04-12 DIAGNOSIS — S82101D Unspecified fracture of upper end of right tibia, subsequent encounter for closed fracture with routine healing: Secondary | ICD-10-CM | POA: Diagnosis not present

## 2015-04-12 DIAGNOSIS — E119 Type 2 diabetes mellitus without complications: Secondary | ICD-10-CM | POA: Diagnosis not present

## 2015-04-12 DIAGNOSIS — A047 Enterocolitis due to Clostridium difficile: Secondary | ICD-10-CM | POA: Diagnosis not present

## 2015-04-14 ENCOUNTER — Other Ambulatory Visit: Payer: Self-pay | Admitting: *Deleted

## 2015-04-14 NOTE — Patient Outreach (Addendum)
[facility]) Care Management  04/14/2015  [name] 17-Jul-1961 [id number]   [facility] call to patient [facility]  Per patient, she is looking to discharge in approximatley 1 week.  However she is concerned about her CAP(Community Alternative Program for disabled and Elderly Adults) services not being approved because she has not met a deadline  This social worker will follow up with this program in preparation for patient's discharge home.   Sheralyn Boatman [facility] Care Management [phone]

## 2015-04-15 ENCOUNTER — Other Ambulatory Visit: Payer: Self-pay | Admitting: *Deleted

## 2015-04-15 NOTE — Patient Outreach (Signed)
[facility]) Care Management  04/15/2015  [name] Mar 01, 1961 [id number]   Care coordination phone call (at the request of patient)to [name],  to discuss cap sCAP(Community Alternative Program for disabled and Elderly Adults) serviceservices for patient .  Per Efraim Kaufmann, she has been trying to get in touch with patient to set up initial screening to assess eligibility forCAP(Community Alternative Program for disabled and Elderly Adults) servicec, however has not been able to reach patient.  Per Efraim Kaufmann, she is aware  that patient is at the skilled nursing facility.  Request made to have patient contact Melissa to schedule initial screening.   [name] [facility] Care Management [phone]

## 2015-04-15 NOTE — Patient Outreach (Signed)
[facility]) Care Management  04/15/2015  [name] November 16, 1960 [id number]  Phone call to patient to request that she contact [name] throughCAP(Community Alternative Program for disabled and Elderly Adults) services to discuss her enrollment and scheduling of the initial screening.  Voicemail left for a return call.   [name] [facility] Care Management [phone]

## 2015-04-18 DIAGNOSIS — S82191D Other fracture of upper end of right tibia, subsequent encounter for closed fracture with routine healing: Secondary | ICD-10-CM | POA: Diagnosis not present

## 2015-04-18 DIAGNOSIS — S82192D Other fracture of upper end of left tibia, subsequent encounter for closed fracture with routine healing: Secondary | ICD-10-CM | POA: Diagnosis not present

## 2015-04-20 ENCOUNTER — Other Ambulatory Visit: Payer: Self-pay | Admitting: *Deleted

## 2015-04-20 ENCOUNTER — Encounter: Payer: Self-pay | Admitting: *Deleted

## 2015-04-20 NOTE — Patient Outreach (Signed)
[facility]) Care Management  04/20/2015  [name] 09-26-1961 [id number] [facility] Alternative Program for disabled and Elderly Adults  Care coordination conference call with patient and [name], RN for CAP(Community Alternative Program for disabled and Elderly Adults).  Ms. Julien Girt clarified  that patient has completed the initial referral process for eligibility for CAP(Community Alternative Program for disabled and Elderly Adults services), however has now reached the second step of the process which is the home assessment.  Patient was not aware of this, thinking that the entire process was complete. Patient will need to call Ms. Julien Girt once she discharges from [place] to schedule the home assessment.  Patient will need to continue with personal care services, until in home assessment is completed and approved which should take approximately 2 weeks after the assessment is completed.  With CAP(Community Alternative Program for disabled and Elderly Adults services), patient will be able to receive longer hours.  Per patient, she is currently receiving 3 hours a day of personal care services through [facility].  Phone call made to [facility], spoke with Eunice Blase, RN [phone] (phone number obtained through [facility] as contact number listed online was out of services) who confirmed that patient's personal care services will resume post discharge from [facility]..  Plan: This Child psychotherapist will continue to follow up with patient to assist with discharge planning from [facility].   [name] [facility] Care Management [phone]

## 2015-04-24 DIAGNOSIS — R197 Diarrhea, unspecified: Secondary | ICD-10-CM | POA: Diagnosis not present

## 2015-04-27 ENCOUNTER — Encounter: Payer: Self-pay | Admitting: *Deleted

## 2015-04-27 ENCOUNTER — Other Ambulatory Visit: Payer: Self-pay | Admitting: *Deleted

## 2015-04-27 NOTE — Patient Outreach (Signed)
[facility]) Care Management  04/27/2015  [name] Oct 22, 1960 [id number]   Phone call to patient to follow up on discharge needs from [facility] skilled nursing facility.  Per patient, she is still there.  Discharge date is anticipated in approximately one week.  Patient could not talk long as she was preparing for a x-ray.    This social worker will contact the discharge planner at this facility on 04/28/15 to follow up further regarding patient's discharge needs.  [name] [facility] Care Management [phone]

## 2015-04-28 ENCOUNTER — Other Ambulatory Visit: Payer: Self-pay | Admitting: *Deleted

## 2015-04-28 NOTE — Patient Outreach (Signed)
[facility]) Care Management  04/28/2015  [name] Sep 23, 1961 [id number]  Phone call to patient to follow up on discharge planning needs from [facility].  Per patient, she will be discharging in about 1.5 weeks.  Patient states that her new hospital bed was ordered and delivered while she was in the hospital.   She is now requesting help with getting a electric wheelchair.  Patient gave this social worker verbal permission to contact [name], discharge planner at [facility] [phone] to discuss obtaining an order for the electric wheelchair and discharge planni  Plan:  This Child psychotherapist to follow up with discharge planner regarding patient's discharge needs from [facility].    [name] [facility] Care Management [phone]

## 2015-05-05 ENCOUNTER — Other Ambulatory Visit: Payer: Self-pay | Admitting: *Deleted

## 2015-05-05 NOTE — Patient Outreach (Signed)
[facility]) Care Management  05/05/2015  FRANCELIA MCLAREN 09/21/1961 [id number]  Care coordination phone call to social worker at [facility] skilled nursing facility-[name] to discuss patient's discharge needs.   Voice mail message left for a return call.   [name] [facility] Care Management [phone]

## 2015-05-10 ENCOUNTER — Other Ambulatory Visit: Payer: Self-pay | Admitting: *Deleted

## 2015-05-10 NOTE — Patient Outreach (Signed)
[facility]) Care Management  05/10/2015  [name] Apr 20, 1961 [id number]  Phone call to [facility] to follow up on patient's discharge needs.  Patient remains at the facility, message left for discharge planner-[name] to return call.   [name] [facility] Care Management [phone]

## 2015-05-10 NOTE — Patient Outreach (Signed)
[facility]) Care Management  05/10/2015  [name] [birth year] [id number]   Phone call to [name] discharge planner at [facility] to discuss discharge plan for patient.  Per [name], patient is still non-weight bearing.  She has an appointment with Orthopaedic doctor on 05/12/15.  Tentative discharge date will be the end of next week.  Patient will have physical and occupational therapy post discharge.  She will also have personal care services until CAP(Community Alternatives Program)  services are in place.  Patient to call CAP(Community Alternatives Program) worker to complete in home assessment post discharge from the skilled nursing facility.  Per Pleas [name] planner she will call this social worker once a discharge date is set.   [name] [facility] Care Management [phone]

## 2015-05-19 DIAGNOSIS — S82192D Other fracture of upper end of left tibia, subsequent encounter for closed fracture with routine healing: Secondary | ICD-10-CM | POA: Diagnosis not present

## 2015-05-19 DIAGNOSIS — S82191D Other fracture of upper end of right tibia, subsequent encounter for closed fracture with routine healing: Secondary | ICD-10-CM | POA: Diagnosis not present

## 2015-05-20 ENCOUNTER — Other Ambulatory Visit: Payer: Self-pay | Admitting: Licensed Clinical Social Worker

## 2015-05-20 NOTE — Patient Outreach (Signed)
Assessment. [name] of [facility] called CSW Lorna Few on 05/20/15 to talk with Lorna Few about status of client. [name] said that client had gone to orthopedic appointment this week and was given weight bearing status at that time. Thus, orthopedist is recommending that client remain at facility a few more weeks to get physical therapy sessions. [name] said she thought client would be at facility for at least 10 more days and could be at facility even longer if client made positive progress in physical therapy sessions. CSW Lorin Picket Aarnav Steagall thanked [name] for information received regarding client.   Plan: Client to participate actively in scheduled physical therapy sessions for client at [facility] facility. Client to communicate, as needed with [name] related to finalizing discharge plan for client.  Kelton Pillar.Mendel Binsfeld MSW, LCSW Licensed Clinical Social Worker [facility] Care Management [phone]

## 2015-05-20 NOTE — Patient Outreach (Signed)
Assessment:  CSW Lorna Few is covering for [employer]. Lorna Few called facility phone number for [name] on 05/20/15.  Lorna Few was not able to speak via phone with [name] but left phone message for [name] on 05/20/15 requesting she return call to CSW Lorna Few at [phone] to discuss current status of client at facility.  CSW Lorna Few is waiting for return call from [name].   Plan: [name] to call CSW Lorna Few at [phone] to discuss current status of client at facility. If [name] does not return call to [employer] in 2 business days, then [employer] will again call [name] next week to attempt to talk with her about current status of client.  Kelton Pillar.Morry Veiga MSW, LCSW Licensed Clinical Social Worker [facility] Care Management [phone]

## 2015-05-25 ENCOUNTER — Other Ambulatory Visit: Payer: Self-pay | Admitting: *Deleted

## 2015-05-30 DIAGNOSIS — S82101D Unspecified fracture of upper end of right tibia, subsequent encounter for closed fracture with routine healing: Secondary | ICD-10-CM | POA: Diagnosis not present

## 2015-05-30 DIAGNOSIS — S82102D Unspecified fracture of upper end of left tibia, subsequent encounter for closed fracture with routine healing: Secondary | ICD-10-CM | POA: Diagnosis not present

## 2015-06-04 DIAGNOSIS — S82102D Unspecified fracture of upper end of left tibia, subsequent encounter for closed fracture with routine healing: Secondary | ICD-10-CM | POA: Diagnosis not present

## 2015-06-04 DIAGNOSIS — S82101D Unspecified fracture of upper end of right tibia, subsequent encounter for closed fracture with routine healing: Secondary | ICD-10-CM | POA: Diagnosis not present

## 2015-06-09 DIAGNOSIS — S82101D Unspecified fracture of upper end of right tibia, subsequent encounter for closed fracture with routine healing: Secondary | ICD-10-CM | POA: Diagnosis not present

## 2015-06-09 DIAGNOSIS — S82102D Unspecified fracture of upper end of left tibia, subsequent encounter for closed fracture with routine healing: Secondary | ICD-10-CM | POA: Diagnosis not present

## 2015-06-13 DIAGNOSIS — S82101D Unspecified fracture of upper end of right tibia, subsequent encounter for closed fracture with routine healing: Secondary | ICD-10-CM | POA: Diagnosis not present

## 2015-06-13 DIAGNOSIS — S82102D Unspecified fracture of upper end of left tibia, subsequent encounter for closed fracture with routine healing: Secondary | ICD-10-CM | POA: Diagnosis not present

## 2015-06-14 ENCOUNTER — Other Ambulatory Visit: Payer: Self-pay | Admitting: *Deleted

## 2015-06-14 NOTE — Patient Outreach (Signed)
[facility]) Care Management  06/14/2015  [name] 1960/10/11 [id number]   Phone call to [facility] health [facility] rehabilitation, [phone] confirming that patient has been discharged home.  Phone call to patient to follow up on social work needs.  Voicemail message left for a return call.    [name] [facility] Care Management [phone]

## 2015-06-17 ENCOUNTER — Other Ambulatory Visit: Payer: Self-pay | Admitting: *Deleted

## 2015-06-17 NOTE — Patient Outreach (Signed)
[facility]) Care Management  06/17/2015  [name] 05-Jan-1961 [id number]  Follow up phone call to patient following her skilled nursing home stay. Per patient she continues to receive personal care services, however has changed agencies.  Patient now with [name]. Patient receives 3 hours a day and 1 hour on the weekends.  Patient also receivng home health services through [facility].  Patient states that she has her medications and is currently waiting on her next home order.  Patient will be assessed for community alternatives program for disabled adults(CAPS) today at 11:30am.    Plan: this social worker will follow up with patient next week regarding community alternatives program for disabled adults(CAPS) assessment and it's outcome.   [name] [facility] Care Management [phone] .

## 2015-06-28 ENCOUNTER — Other Ambulatory Visit: Payer: Self-pay | Admitting: *Deleted

## 2015-06-28 NOTE — Patient Outreach (Signed)
[facility]) Care Management  06/28/2015  [name] 1961-01-24 [id number]   Phone call to patient to follow up on status of community alternatives program for disabled adults(CAPS) .  Per patient she is currently working with [name]   [phone] and has asked for at least 40 hours per week of personal care.  This Child psychotherapist contacted Ms.  Idowu who stated that patient has not been approved yet.  She is still working in submitting her  plan of care  to be approved by supervisor .  This process should take about a month.  Patient to continue to receive personal care services until  community alternatives program for disabled adults(CAPS)  is approved.  Patient lives on second level of her apartment building and is having trouble getting to her appointments.  Patient has no elevator in her apartments complex.  Used to rely on her son and nephew, however her nephew is working full time and her son's knee has given out.  Patient has requested that she be moved to a first floor apartment but one has not become available yet.  Patient will give this social worker a call back in regards to alternative options    [name] [facility] Care Management [phone]

## 2015-07-04 DIAGNOSIS — S92324D Nondisplaced fracture of second metatarsal bone, right foot, subsequent encounter for fracture with routine healing: Secondary | ICD-10-CM | POA: Diagnosis not present

## 2015-07-04 DIAGNOSIS — S43401D Unspecified sprain of right shoulder joint, subsequent encounter: Secondary | ICD-10-CM | POA: Diagnosis not present

## 2015-07-06 DIAGNOSIS — S82101D Unspecified fracture of upper end of right tibia, subsequent encounter for closed fracture with routine healing: Secondary | ICD-10-CM | POA: Diagnosis not present

## 2015-07-06 DIAGNOSIS — E119 Type 2 diabetes mellitus without complications: Secondary | ICD-10-CM | POA: Diagnosis not present

## 2015-07-06 DIAGNOSIS — S82832D Other fracture of upper and lower end of left fibula, subsequent encounter for closed fracture with routine healing: Secondary | ICD-10-CM | POA: Diagnosis not present

## 2015-07-06 DIAGNOSIS — S82102D Unspecified fracture of upper end of left tibia, subsequent encounter for closed fracture with routine healing: Secondary | ICD-10-CM | POA: Diagnosis not present

## 2015-07-06 DIAGNOSIS — S82831D Other fracture of upper and lower end of right fibula, subsequent encounter for closed fracture with routine healing: Secondary | ICD-10-CM | POA: Diagnosis not present

## 2015-07-06 DIAGNOSIS — G609 Hereditary and idiopathic neuropathy, unspecified: Secondary | ICD-10-CM | POA: Diagnosis not present

## 2015-07-08 ENCOUNTER — Other Ambulatory Visit: Payer: Self-pay | Admitting: *Deleted

## 2015-07-08 ENCOUNTER — Encounter: Payer: Self-pay | Admitting: *Deleted

## 2015-07-08 DIAGNOSIS — G609 Hereditary and idiopathic neuropathy, unspecified: Secondary | ICD-10-CM | POA: Diagnosis not present

## 2015-07-08 DIAGNOSIS — S82102D Unspecified fracture of upper end of left tibia, subsequent encounter for closed fracture with routine healing: Secondary | ICD-10-CM | POA: Diagnosis not present

## 2015-07-08 DIAGNOSIS — S82101D Unspecified fracture of upper end of right tibia, subsequent encounter for closed fracture with routine healing: Secondary | ICD-10-CM | POA: Diagnosis not present

## 2015-07-08 DIAGNOSIS — S82832D Other fracture of upper and lower end of left fibula, subsequent encounter for closed fracture with routine healing: Secondary | ICD-10-CM | POA: Diagnosis not present

## 2015-07-08 DIAGNOSIS — E119 Type 2 diabetes mellitus without complications: Secondary | ICD-10-CM | POA: Diagnosis not present

## 2015-07-08 DIAGNOSIS — S82831D Other fracture of upper and lower end of right fibula, subsequent encounter for closed fracture with routine healing: Secondary | ICD-10-CM | POA: Diagnosis not present

## 2015-07-08 NOTE — Patient Outreach (Signed)
[facility]) Care Management  07/08/2015  [name] 02/28/1961 [id number]   Phone call to patient to assess for continued social work involvement.  Patient currently receiving home health services. . Per patient she continues to receive personal care services, however has changed agencies. Patient now with [name]. Patient receives 3 hours a day and 1 hour on the weekends.    Status of community alternatives program for disabled adults(CAPS) remains pending . Patient  is currently working with [name] [phone] and has asked for at least 40 hours per week of personal care. This Child psychotherapist contacted [name] who stated that patient has not been approved yet. She is still working in submitting her plan of care to be approved by supervisor . This process should take about a month. Patient to continue to receive personal care services until community alternatives program for disabled adults(CAPS) is approved.  Patient reports having a doctor's appointment yesterday, her aid and her son helped her down the stairs and provided the transportation.  Per patient, her name is on the waiting list for a first floor apartment and she should be able to move by the end of the month.  Patient's case to be closed to [facility] social work services at this time.  Patient encouraged to call if further needs arise.   [name] [facility] Care Management [phone]

## 2015-07-11 DIAGNOSIS — S82832D Other fracture of upper and lower end of left fibula, subsequent encounter for closed fracture with routine healing: Secondary | ICD-10-CM | POA: Diagnosis not present

## 2015-07-11 DIAGNOSIS — E119 Type 2 diabetes mellitus without complications: Secondary | ICD-10-CM | POA: Diagnosis not present

## 2015-07-11 DIAGNOSIS — S82101D Unspecified fracture of upper end of right tibia, subsequent encounter for closed fracture with routine healing: Secondary | ICD-10-CM | POA: Diagnosis not present

## 2015-07-11 DIAGNOSIS — S82102D Unspecified fracture of upper end of left tibia, subsequent encounter for closed fracture with routine healing: Secondary | ICD-10-CM | POA: Diagnosis not present

## 2015-07-11 DIAGNOSIS — S82831D Other fracture of upper and lower end of right fibula, subsequent encounter for closed fracture with routine healing: Secondary | ICD-10-CM | POA: Diagnosis not present

## 2015-07-11 DIAGNOSIS — G609 Hereditary and idiopathic neuropathy, unspecified: Secondary | ICD-10-CM | POA: Diagnosis not present

## 2015-07-13 DIAGNOSIS — S82102D Unspecified fracture of upper end of left tibia, subsequent encounter for closed fracture with routine healing: Secondary | ICD-10-CM | POA: Diagnosis not present

## 2015-07-13 DIAGNOSIS — E119 Type 2 diabetes mellitus without complications: Secondary | ICD-10-CM | POA: Diagnosis not present

## 2015-07-13 DIAGNOSIS — S82831D Other fracture of upper and lower end of right fibula, subsequent encounter for closed fracture with routine healing: Secondary | ICD-10-CM | POA: Diagnosis not present

## 2015-07-13 DIAGNOSIS — G609 Hereditary and idiopathic neuropathy, unspecified: Secondary | ICD-10-CM | POA: Diagnosis not present

## 2015-07-13 DIAGNOSIS — S82101D Unspecified fracture of upper end of right tibia, subsequent encounter for closed fracture with routine healing: Secondary | ICD-10-CM | POA: Diagnosis not present

## 2015-07-13 DIAGNOSIS — S82832D Other fracture of upper and lower end of left fibula, subsequent encounter for closed fracture with routine healing: Secondary | ICD-10-CM | POA: Diagnosis not present

## 2015-07-18 DIAGNOSIS — S82831D Other fracture of upper and lower end of right fibula, subsequent encounter for closed fracture with routine healing: Secondary | ICD-10-CM | POA: Diagnosis not present

## 2015-07-18 DIAGNOSIS — S82101D Unspecified fracture of upper end of right tibia, subsequent encounter for closed fracture with routine healing: Secondary | ICD-10-CM | POA: Diagnosis not present

## 2015-07-18 DIAGNOSIS — G609 Hereditary and idiopathic neuropathy, unspecified: Secondary | ICD-10-CM | POA: Diagnosis not present

## 2015-07-18 DIAGNOSIS — E119 Type 2 diabetes mellitus without complications: Secondary | ICD-10-CM | POA: Diagnosis not present

## 2015-07-18 DIAGNOSIS — S82102D Unspecified fracture of upper end of left tibia, subsequent encounter for closed fracture with routine healing: Secondary | ICD-10-CM | POA: Diagnosis not present

## 2015-07-18 DIAGNOSIS — S82832D Other fracture of upper and lower end of left fibula, subsequent encounter for closed fracture with routine healing: Secondary | ICD-10-CM | POA: Diagnosis not present

## 2015-07-20 DIAGNOSIS — S82831D Other fracture of upper and lower end of right fibula, subsequent encounter for closed fracture with routine healing: Secondary | ICD-10-CM | POA: Diagnosis not present

## 2015-07-20 DIAGNOSIS — S82102D Unspecified fracture of upper end of left tibia, subsequent encounter for closed fracture with routine healing: Secondary | ICD-10-CM | POA: Diagnosis not present

## 2015-07-20 DIAGNOSIS — S82832D Other fracture of upper and lower end of left fibula, subsequent encounter for closed fracture with routine healing: Secondary | ICD-10-CM | POA: Diagnosis not present

## 2015-07-20 DIAGNOSIS — G609 Hereditary and idiopathic neuropathy, unspecified: Secondary | ICD-10-CM | POA: Diagnosis not present

## 2015-07-20 DIAGNOSIS — E119 Type 2 diabetes mellitus without complications: Secondary | ICD-10-CM | POA: Diagnosis not present

## 2015-07-20 DIAGNOSIS — S82101D Unspecified fracture of upper end of right tibia, subsequent encounter for closed fracture with routine healing: Secondary | ICD-10-CM | POA: Diagnosis not present

## 2015-07-21 DIAGNOSIS — S82101D Unspecified fracture of upper end of right tibia, subsequent encounter for closed fracture with routine healing: Secondary | ICD-10-CM | POA: Diagnosis not present

## 2015-07-21 DIAGNOSIS — E119 Type 2 diabetes mellitus without complications: Secondary | ICD-10-CM | POA: Diagnosis not present

## 2015-07-21 DIAGNOSIS — G609 Hereditary and idiopathic neuropathy, unspecified: Secondary | ICD-10-CM | POA: Diagnosis not present

## 2015-07-21 DIAGNOSIS — S82832D Other fracture of upper and lower end of left fibula, subsequent encounter for closed fracture with routine healing: Secondary | ICD-10-CM | POA: Diagnosis not present

## 2015-07-21 DIAGNOSIS — S82831D Other fracture of upper and lower end of right fibula, subsequent encounter for closed fracture with routine healing: Secondary | ICD-10-CM | POA: Diagnosis not present

## 2015-07-21 DIAGNOSIS — S82102D Unspecified fracture of upper end of left tibia, subsequent encounter for closed fracture with routine healing: Secondary | ICD-10-CM | POA: Diagnosis not present

## 2015-07-25 DIAGNOSIS — G609 Hereditary and idiopathic neuropathy, unspecified: Secondary | ICD-10-CM | POA: Diagnosis not present

## 2015-07-25 DIAGNOSIS — S82832D Other fracture of upper and lower end of left fibula, subsequent encounter for closed fracture with routine healing: Secondary | ICD-10-CM | POA: Diagnosis not present

## 2015-07-25 DIAGNOSIS — S82101D Unspecified fracture of upper end of right tibia, subsequent encounter for closed fracture with routine healing: Secondary | ICD-10-CM | POA: Diagnosis not present

## 2015-07-25 DIAGNOSIS — S82102D Unspecified fracture of upper end of left tibia, subsequent encounter for closed fracture with routine healing: Secondary | ICD-10-CM | POA: Diagnosis not present

## 2015-07-25 DIAGNOSIS — E119 Type 2 diabetes mellitus without complications: Secondary | ICD-10-CM | POA: Diagnosis not present

## 2015-07-25 DIAGNOSIS — S82831D Other fracture of upper and lower end of right fibula, subsequent encounter for closed fracture with routine healing: Secondary | ICD-10-CM | POA: Diagnosis not present

## 2015-07-27 DIAGNOSIS — E119 Type 2 diabetes mellitus without complications: Secondary | ICD-10-CM | POA: Diagnosis not present

## 2015-07-27 DIAGNOSIS — S82831D Other fracture of upper and lower end of right fibula, subsequent encounter for closed fracture with routine healing: Secondary | ICD-10-CM | POA: Diagnosis not present

## 2015-07-27 DIAGNOSIS — S82101D Unspecified fracture of upper end of right tibia, subsequent encounter for closed fracture with routine healing: Secondary | ICD-10-CM | POA: Diagnosis not present

## 2015-07-27 DIAGNOSIS — S82102D Unspecified fracture of upper end of left tibia, subsequent encounter for closed fracture with routine healing: Secondary | ICD-10-CM | POA: Diagnosis not present

## 2015-07-27 DIAGNOSIS — G609 Hereditary and idiopathic neuropathy, unspecified: Secondary | ICD-10-CM | POA: Diagnosis not present

## 2015-07-27 DIAGNOSIS — S82832D Other fracture of upper and lower end of left fibula, subsequent encounter for closed fracture with routine healing: Secondary | ICD-10-CM | POA: Diagnosis not present

## 2015-07-28 DIAGNOSIS — S82831D Other fracture of upper and lower end of right fibula, subsequent encounter for closed fracture with routine healing: Secondary | ICD-10-CM | POA: Diagnosis not present

## 2015-07-28 DIAGNOSIS — S82101D Unspecified fracture of upper end of right tibia, subsequent encounter for closed fracture with routine healing: Secondary | ICD-10-CM | POA: Diagnosis not present

## 2015-07-28 DIAGNOSIS — E119 Type 2 diabetes mellitus without complications: Secondary | ICD-10-CM | POA: Diagnosis not present

## 2015-07-28 DIAGNOSIS — G609 Hereditary and idiopathic neuropathy, unspecified: Secondary | ICD-10-CM | POA: Diagnosis not present

## 2015-07-28 DIAGNOSIS — S82102D Unspecified fracture of upper end of left tibia, subsequent encounter for closed fracture with routine healing: Secondary | ICD-10-CM | POA: Diagnosis not present

## 2015-07-28 DIAGNOSIS — S82832D Other fracture of upper and lower end of left fibula, subsequent encounter for closed fracture with routine healing: Secondary | ICD-10-CM | POA: Diagnosis not present

## 2015-08-01 DIAGNOSIS — M25561 Pain in right knee: Secondary | ICD-10-CM | POA: Diagnosis not present

## 2015-08-01 DIAGNOSIS — S43401D Unspecified sprain of right shoulder joint, subsequent encounter: Secondary | ICD-10-CM | POA: Diagnosis not present

## 2015-08-01 DIAGNOSIS — M25562 Pain in left knee: Secondary | ICD-10-CM | POA: Diagnosis not present

## 2015-08-03 DIAGNOSIS — S82832D Other fracture of upper and lower end of left fibula, subsequent encounter for closed fracture with routine healing: Secondary | ICD-10-CM | POA: Diagnosis not present

## 2015-08-03 DIAGNOSIS — E119 Type 2 diabetes mellitus without complications: Secondary | ICD-10-CM | POA: Diagnosis not present

## 2015-08-03 DIAGNOSIS — S82102D Unspecified fracture of upper end of left tibia, subsequent encounter for closed fracture with routine healing: Secondary | ICD-10-CM | POA: Diagnosis not present

## 2015-08-03 DIAGNOSIS — S82831D Other fracture of upper and lower end of right fibula, subsequent encounter for closed fracture with routine healing: Secondary | ICD-10-CM | POA: Diagnosis not present

## 2015-08-03 DIAGNOSIS — G609 Hereditary and idiopathic neuropathy, unspecified: Secondary | ICD-10-CM | POA: Diagnosis not present

## 2015-08-03 DIAGNOSIS — S82101D Unspecified fracture of upper end of right tibia, subsequent encounter for closed fracture with routine healing: Secondary | ICD-10-CM | POA: Diagnosis not present

## 2015-08-04 DIAGNOSIS — S82831D Other fracture of upper and lower end of right fibula, subsequent encounter for closed fracture with routine healing: Secondary | ICD-10-CM | POA: Diagnosis not present

## 2015-08-04 DIAGNOSIS — S82832D Other fracture of upper and lower end of left fibula, subsequent encounter for closed fracture with routine healing: Secondary | ICD-10-CM | POA: Diagnosis not present

## 2015-08-04 DIAGNOSIS — S82102D Unspecified fracture of upper end of left tibia, subsequent encounter for closed fracture with routine healing: Secondary | ICD-10-CM | POA: Diagnosis not present

## 2015-08-04 DIAGNOSIS — G609 Hereditary and idiopathic neuropathy, unspecified: Secondary | ICD-10-CM | POA: Diagnosis not present

## 2015-08-04 DIAGNOSIS — S82101D Unspecified fracture of upper end of right tibia, subsequent encounter for closed fracture with routine healing: Secondary | ICD-10-CM | POA: Diagnosis not present

## 2015-08-04 DIAGNOSIS — E119 Type 2 diabetes mellitus without complications: Secondary | ICD-10-CM | POA: Diagnosis not present

## 2015-08-08 DIAGNOSIS — E039 Hypothyroidism, unspecified: Secondary | ICD-10-CM | POA: Diagnosis not present

## 2015-08-08 DIAGNOSIS — Z86711 Personal history of pulmonary embolism: Secondary | ICD-10-CM | POA: Diagnosis not present

## 2015-08-08 DIAGNOSIS — E1142 Type 2 diabetes mellitus with diabetic polyneuropathy: Secondary | ICD-10-CM | POA: Diagnosis not present

## 2015-08-08 DIAGNOSIS — F411 Generalized anxiety disorder: Secondary | ICD-10-CM | POA: Diagnosis not present

## 2015-08-08 DIAGNOSIS — E785 Hyperlipidemia, unspecified: Secondary | ICD-10-CM | POA: Diagnosis not present

## 2015-08-08 DIAGNOSIS — M8588 Other specified disorders of bone density and structure, other site: Secondary | ICD-10-CM | POA: Diagnosis not present

## 2015-08-08 DIAGNOSIS — G4700 Insomnia, unspecified: Secondary | ICD-10-CM | POA: Diagnosis not present

## 2015-08-08 DIAGNOSIS — I10 Essential (primary) hypertension: Secondary | ICD-10-CM | POA: Diagnosis not present

## 2015-08-10 DIAGNOSIS — E119 Type 2 diabetes mellitus without complications: Secondary | ICD-10-CM | POA: Diagnosis not present

## 2015-08-10 DIAGNOSIS — S82832D Other fracture of upper and lower end of left fibula, subsequent encounter for closed fracture with routine healing: Secondary | ICD-10-CM | POA: Diagnosis not present

## 2015-08-10 DIAGNOSIS — S82831D Other fracture of upper and lower end of right fibula, subsequent encounter for closed fracture with routine healing: Secondary | ICD-10-CM | POA: Diagnosis not present

## 2015-08-10 DIAGNOSIS — S82101D Unspecified fracture of upper end of right tibia, subsequent encounter for closed fracture with routine healing: Secondary | ICD-10-CM | POA: Diagnosis not present

## 2015-08-10 DIAGNOSIS — S82102D Unspecified fracture of upper end of left tibia, subsequent encounter for closed fracture with routine healing: Secondary | ICD-10-CM | POA: Diagnosis not present

## 2015-08-10 DIAGNOSIS — G609 Hereditary and idiopathic neuropathy, unspecified: Secondary | ICD-10-CM | POA: Diagnosis not present

## 2015-11-23 DIAGNOSIS — M47816 Spondylosis without myelopathy or radiculopathy, lumbar region: Secondary | ICD-10-CM | POA: Diagnosis not present

## 2015-11-23 DIAGNOSIS — M810 Age-related osteoporosis without current pathological fracture: Secondary | ICD-10-CM | POA: Diagnosis not present

## 2015-11-23 DIAGNOSIS — M47812 Spondylosis without myelopathy or radiculopathy, cervical region: Secondary | ICD-10-CM | POA: Diagnosis not present

## 2016-01-09 ENCOUNTER — Emergency Department (HOSPITAL_COMMUNITY)
Admission: EM | Admit: 2016-01-09 | Discharge: 2016-01-09 | Disposition: A | Discharge status: Home / Self Care | Payer: Medicare Other | Attending: Emergency Medicine | Admitting: Emergency Medicine

## 2016-01-09 ENCOUNTER — Emergency Department (HOSPITAL_COMMUNITY): Payer: Medicare Other

## 2016-01-09 ENCOUNTER — Encounter (HOSPITAL_COMMUNITY): Payer: Self-pay

## 2016-01-09 DIAGNOSIS — Z3202 Encounter for pregnancy test, result negative: Secondary | ICD-10-CM | POA: Insufficient documentation

## 2016-01-09 DIAGNOSIS — G629 Polyneuropathy, unspecified: Secondary | ICD-10-CM | POA: Insufficient documentation

## 2016-01-09 DIAGNOSIS — N200 Calculus of kidney: Secondary | ICD-10-CM | POA: Diagnosis not present

## 2016-01-09 DIAGNOSIS — R05 Cough: Secondary | ICD-10-CM | POA: Diagnosis not present

## 2016-01-09 DIAGNOSIS — Z86711 Personal history of pulmonary embolism: Secondary | ICD-10-CM | POA: Insufficient documentation

## 2016-01-09 DIAGNOSIS — E119 Type 2 diabetes mellitus without complications: Secondary | ICD-10-CM | POA: Insufficient documentation

## 2016-01-09 DIAGNOSIS — F17210 Nicotine dependence, cigarettes, uncomplicated: Secondary | ICD-10-CM | POA: Diagnosis not present

## 2016-01-09 DIAGNOSIS — J45909 Unspecified asthma, uncomplicated: Secondary | ICD-10-CM | POA: Insufficient documentation

## 2016-01-09 DIAGNOSIS — R1084 Generalized abdominal pain: Secondary | ICD-10-CM | POA: Diagnosis present

## 2016-01-09 DIAGNOSIS — K219 Gastro-esophageal reflux disease without esophagitis: Secondary | ICD-10-CM | POA: Insufficient documentation

## 2016-01-09 DIAGNOSIS — N12 Tubulo-interstitial nephritis, not specified as acute or chronic: Secondary | ICD-10-CM | POA: Diagnosis not present

## 2016-01-09 DIAGNOSIS — E039 Hypothyroidism, unspecified: Secondary | ICD-10-CM | POA: Insufficient documentation

## 2016-01-09 DIAGNOSIS — I10 Essential (primary) hypertension: Secondary | ICD-10-CM | POA: Insufficient documentation

## 2016-01-09 DIAGNOSIS — Z880 Allergy status to penicillin: Secondary | ICD-10-CM | POA: Diagnosis not present

## 2016-01-09 DIAGNOSIS — Z8619 Personal history of other infectious and parasitic diseases: Secondary | ICD-10-CM | POA: Insufficient documentation

## 2016-01-09 DIAGNOSIS — E785 Hyperlipidemia, unspecified: Secondary | ICD-10-CM | POA: Insufficient documentation

## 2016-01-09 DIAGNOSIS — Z79899 Other long term (current) drug therapy: Secondary | ICD-10-CM | POA: Insufficient documentation

## 2016-01-09 DIAGNOSIS — Z9889 Other specified postprocedural states: Secondary | ICD-10-CM | POA: Insufficient documentation

## 2016-01-09 LAB — BLOOD GAS, VENOUS
ACID-BASE DEFICIT: 1.6 mmol/L (ref 0.0–2.0)
BICARBONATE: 21.2 meq/L (ref 20.0–24.0)
O2 Saturation: 73.2 %
PCO2 VEN: 31.1 mmHg — AB (ref 45.0–50.0)
PO2 VEN: 41.9 mmHg (ref 31.0–45.0)
Patient temperature: 98.6
TCO2: 19.2 mmol/L (ref 0–100)
pH, Ven: 7.449 — ABNORMAL HIGH (ref 7.250–7.300)

## 2016-01-09 LAB — URINE MICROSCOPIC-ADD ON

## 2016-01-09 LAB — CBC
HEMATOCRIT: 34 % — AB (ref 36.0–46.0)
HEMOGLOBIN: 11.4 g/dL — AB (ref 12.0–15.0)
MCH: 31.5 pg (ref 26.0–34.0)
MCHC: 33.5 g/dL (ref 30.0–36.0)
MCV: 93.9 fL (ref 78.0–100.0)
Platelets: 191 10*3/uL (ref 150–400)
RBC: 3.62 MIL/uL — ABNORMAL LOW (ref 3.87–5.11)
RDW: 14.2 % (ref 11.5–15.5)
WBC: 12.1 10*3/uL — AB (ref 4.0–10.5)

## 2016-01-09 LAB — COMPREHENSIVE METABOLIC PANEL
ALBUMIN: 2.1 g/dL — AB (ref 3.5–5.0)
ALT: 18 U/L (ref 14–54)
ANION GAP: 19 — AB (ref 5–15)
AST: 43 U/L — ABNORMAL HIGH (ref 15–41)
Alkaline Phosphatase: 190 U/L — ABNORMAL HIGH (ref 38–126)
BILIRUBIN TOTAL: 1.1 mg/dL (ref 0.3–1.2)
BUN: 6 mg/dL (ref 6–20)
CALCIUM: 7.6 mg/dL — AB (ref 8.9–10.3)
CO2: 18 mmol/L — ABNORMAL LOW (ref 22–32)
Chloride: 106 mmol/L (ref 101–111)
Creatinine, Ser: 0.89 mg/dL (ref 0.44–1.00)
GFR calc Af Amer: >60 mL/min (ref >60)
GFR calc non Af Amer: >60 mL/min (ref >60)
GLUCOSE: 81 mg/dL (ref 65–99)
Potassium: 3.2 mmol/L — ABNORMAL LOW (ref 3.5–5.1)
Sodium: 143 mmol/L (ref 135–145)
TOTAL PROTEIN: 5.4 g/dL — AB (ref 6.5–8.1)

## 2016-01-09 LAB — I-STAT BETA HCG BLOOD, ED (MC, WL, AP ONLY)

## 2016-01-09 LAB — LIPASE, BLOOD: Lipase: 13 U/L (ref 11–51)

## 2016-01-09 LAB — URINALYSIS, ROUTINE W REFLEX MICROSCOPIC
Glucose, UA: NEGATIVE mg/dL
Ketones, ur: NEGATIVE mg/dL
NITRITE: NEGATIVE
PH: 5.5 (ref 5.0–8.0)
Protein, ur: 30 mg/dL — AB
Specific Gravity, Urine: 1.03 (ref 1.005–1.030)

## 2016-01-09 MED ORDER — DEXTROSE 5 % IV SOLN
1.0000 g | Freq: Once | INTRAVENOUS | Status: AC
  Administered 2016-01-09: 1 g via INTRAVENOUS
  Filled 2016-01-09: qty 10

## 2016-01-09 MED ORDER — LACTATED RINGERS IV BOLUS (SEPSIS)
1000.0000 mL | Freq: Once | INTRAVENOUS | Status: AC
  Administered 2016-01-09: 1000 mL via INTRAVENOUS

## 2016-01-09 MED ORDER — ONDANSETRON 4 MG PO TBDP: 4.0000 mg | ORAL_TABLET | Freq: Three times a day (TID) | ORAL | Status: DC | PRN

## 2016-01-09 MED ORDER — HYDROMORPHONE HCL 2 MG PO TABS
2.0000 mg | ORAL_TABLET | Freq: Once | ORAL | Status: AC
  Administered 2016-01-09: 2 mg via ORAL
  Filled 2016-01-09: qty 1

## 2016-01-09 MED ORDER — ONDANSETRON 4 MG PO TBDP
4.0000 mg | ORAL_TABLET | Freq: Once | ORAL | Status: AC
  Administered 2016-01-09: 4 mg via ORAL
  Filled 2016-01-09: qty 1

## 2016-01-09 MED ORDER — ONDANSETRON 4 MG PO TBDP
4.0000 mg | ORAL_TABLET | Freq: Once | ORAL | Status: AC | PRN
  Administered 2016-01-09: 4 mg via ORAL
  Filled 2016-01-09: qty 1

## 2016-01-09 MED ORDER — CEPHALEXIN 500 MG PO CAPS: 500.0000 mg | ORAL_CAPSULE | Freq: Two times a day (BID) | ORAL | Status: DC

## 2016-01-09 MED ORDER — HYDROCODONE-ACETAMINOPHEN 5-325 MG PO TABS: 1.0000 | ORAL_TABLET | Freq: Four times a day (QID) | ORAL | Status: DC | PRN

## 2016-01-09 MED ORDER — HYDROMORPHONE HCL 2 MG/ML IJ SOLN
2.0000 mg | Freq: Once | INTRAMUSCULAR | Status: AC
  Administered 2016-01-09: 2 mg via INTRAVENOUS
  Filled 2016-01-09: qty 1

## 2016-01-09 NOTE — ED Notes (Signed)
Pt began screaming at the top of her lungs while this RN was attempting to push medications.  Pt assured that this RN was pushing medication as slowly as possible.  Pt continued to scream, cry, and state "you didn't listen."  Pt, again, reassured that this RN pushed the medication as slowly as possible.  Pt stated that she accepted this RN's apology.  Then asked to give this RN a hug.  When I refused, Pt began crying again.  It was explained that this RN is not a Journalist, newspaper and for her not to be offended.

## 2016-01-09 NOTE — ED Notes (Signed)
Upon further assessment, Pt reports that symptoms started x 4 days ago.  Pt sts "the paramedic should have circled back around and asked more questions."  Pt very demanding.

## 2016-01-09 NOTE — ED Notes (Signed)
Bed: WA08 Expected date:  Expected time:  Means of arrival:  Comments: EMS/61F/vomiting

## 2016-01-09 NOTE — ED Notes (Signed)
Attempted to give oral medications, pulled the top back of Zofran, provided patient medication. After giving medication, she stated that she had stated that she didn't want anything open when in fact she never mentioned this to myself.

## 2016-01-09 NOTE — ED Notes (Signed)
Pt leaning over edge of the bed, attempted to put up side rails, pt stated "no do not put those up, she knows they are down".

## 2016-01-09 NOTE — ED Notes (Addendum)
Per EMS, Pt, from home, c/o generalized abdominal pain and n/v x 2 days and BLE pain x 4 days.  Pain score 10/10.  Hx of chronic pain, pancreatitis, and crohn's disease.  Pt reports "this is a new pain."        Pt sts "we won't have a problem, if you given me my dilaudid."

## 2016-01-09 NOTE — ED Notes (Signed)
Patient has been provided ice chips by provider.

## 2016-01-09 NOTE — ED Provider Notes (Addendum)
CSN: [id number]     Arrival date & time 01/09/16  1150 History   First MD Initiated Contact with Patient 01/09/16 1224     Chief Complaint  Patient presents with  . Abdominal Pain  . Emesis     (Consider location/radiation/quality/duration/timing/severity/associated sxs/prior Treatment) HPI Comments: Pt comes in with cc of weakness. Pt has crohns, DM, PE, Pancreatitis, Cdiff infection. Pt started getting sick on Wednesday. She started getting weak and feeling lightheaded. Pt also having pain - generalized, but worse over the abdomen, R groin and lower back. No falls. Able to ambulate. Pt having nausea and emesis starting the weekend - she is unable to keep anything down. Pt has diarrhea staring last night. No bloody stools. Pt has no dysuria, no hematuria, no polyuria. Pt denies cough. Pt has intermittent headache, no neck stiffness.   ROS 10 Systems reviewed and are negative for acute change except as noted in the HPI.     Patient is a 55 y.o. female presenting with abdominal pain and vomiting. The history is provided by the patient.  Abdominal Pain Associated symptoms: vomiting   Emesis Associated symptoms: abdominal pain     Past Medical History  Diagnosis Date  . Glaucoma   . Diabetes mellitus without complication (HCC)   . Hypertension   . Neuropathy (HCC)   . Crohn disease (HCC)   . Asthma   . Pulmonary embolus (HCC) 05/24/2014  . Hypothyroidism   . GERD (gastroesophageal reflux disease)   . HLD (hyperlipidemia)   . C. difficile colitis   . Pancreatitis   . Asthma   . Tobacco abuse    Past Surgical History  Procedure Laterality Date  . Cesarean section    . Esophagogastroduodenoscopy N/A 05/21/2014    Procedure: ESOPHAGOGASTRODUODENOSCOPY (EGD);  Surgeon: Petra Kuba, MD;  Location: [facility] ENDOSCOPY;  Service: Endoscopy;  Laterality: N/A;  . Esophagogastroduodenoscopy N/A 03/02/2015    Procedure: ESOPHAGOGASTRODUODENOSCOPY (EGD);  Surgeon: Bernette Redbird, MD;   Location: [facility] ENDOSCOPY;  Service: Endoscopy;  Laterality: N/A;  . Colonoscopy N/A 03/02/2015    Procedure: COLONOSCOPY;  Surgeon: Bernette Redbird, MD;  Location: [facility] ENDOSCOPY;  Service: Endoscopy;  Laterality: N/A;   Family History  Problem Relation Age of Onset  . Breast cancer Mother   . Heart disease Mother 26    CABG  . Diabetes Mother   . Heart disease Father 107    CABG  . Diabetes Father    Social History  Substance Use Topics  . Smoking status: Current Every Day Smoker -- 0.25 packs/day for 26 years    Types: Cigarettes  . Smokeless tobacco: Never Used  . Alcohol Use: No   OB History    No data available     Review of Systems  Gastrointestinal: Positive for vomiting and abdominal pain.      Allergies  Iohexol; Spiriva handihaler; Ativan; and Penicillins  Home Medications   Prior to Admission medications   Medication Sig Start Date End Date Taking? Authorizing Provider  calcium carbonate (OS-CAL - DOSED IN MG OF ELEMENTAL CALCIUM) 1250 (500 CA) MG tablet Take 1 tablet (500 mg of elemental calcium total) by mouth daily with breakfast. 03/04/15  Yes Alison Murray, MD  folic acid (FOLVITE) 1 MG tablet Take 1 tablet (1 mg total) by mouth daily. 08/02/14  Yes Kathlen Mody, MD  gabapentin (NEURONTIN) 100 MG capsule Take 1 capsule (100 mg total) by mouth 2 (two) times daily. 04/05/15  Yes Vassie Loll, MD  HYDROmorphone (  DILAUDID) 4 MG tablet Take 8 mg by mouth every 4 (four) hours as needed for severe pain.   Yes Historical Provider, MD  levothyroxine (SYNTHROID, LEVOTHROID) 25 MCG tablet Take 1 tablet (25 mcg total) by mouth daily before breakfast. 08/02/14  Yes Kathlen Mody, MD  meclizine (ANTIVERT) 25 MG tablet Take 25 mg by mouth 3 (three) times daily as needed for dizziness.   Yes Historical Provider, MD  ondansetron (ZOFRAN) 4 MG tablet Take 4 mg by mouth every 8 (eight) hours as needed for nausea or vomiting.   Yes Historical Provider, MD  oxyCODONE-acetaminophen  (PERCOCET/ROXICET) 5-325 MG per tablet Take 1 tablet by mouth every 6 (six) hours as needed for moderate pain. 04/05/15  Yes Vassie Loll, MD  pantoprazole (PROTONIX) 20 MG tablet Take 40 mg by mouth daily.   Yes Historical Provider, MD  rosuvastatin (CRESTOR) 20 MG tablet Take 1 tablet (20 mg total) by mouth daily. 08/02/14  Yes Kathlen Mody, MD  saccharomyces boulardii (FLORASTOR) 250 MG capsule Take 1 capsule (250 mg total) by mouth 2 (two) times daily. 04/05/15  Yes Vassie Loll, MD  thiamine (VITAMIN B-1) 100 MG tablet Take 1 tablet (100 mg total) by mouth daily. 08/02/14  Yes Kathlen Mody, MD  traZODone (DESYREL) 25 mg TABS tablet Take 0.5 tablets (25 mg total) by mouth at bedtime as needed for sleep. 08/02/14  Yes Kathlen Mody, MD  venlafaxine (EFFEXOR) 25 MG tablet Take 1 tablet (25 mg total) by mouth 2 (two) times daily. 03/04/15  Yes Alison Murray, MD  apixaban (ELIQUIS) 2.5 MG TABS tablet Take 1 tablet (2.5 mg total) by mouth 2 (two) times daily. Patient not taking: Reported on 01/09/2016 04/05/15   Vassie Loll, MD  feeding supplement, RESOURCE BREEZE, (RESOURCE BREEZE) LIQD Take 1 Container by mouth 3 (three) times daily between meals. Patient not taking: Reported on 01/09/2016 08/02/14   Kathlen Mody, MD  lactulose (CHRONULAC) 10 GM/15ML solution Take 15 mLs (10 g total) by mouth daily as needed for moderate constipation. Patient not taking: Reported on 01/09/2016 04/05/15   Vassie Loll, MD  metoprolol tartrate (LOPRESSOR) 25 MG tablet Take 0.5 tablets (12.5 mg total) by mouth 2 (two) times daily. Patient not taking: Reported on 01/09/2016 04/05/15   Vassie Loll, MD   BP 92/80 mmHg  Pulse 78  Temp(Src) 98.7 F (37.1 C) (Oral)  Resp 18  Wt 84 lb (38.102 kg)  SpO2 98%  LMP 10/01/2010 Physical Exam  Constitutional: She is oriented to person, place, and time. She appears well-developed and well-nourished.  HENT:  Head: Normocephalic and atraumatic.  Eyes: EOM are normal. Pupils are equal,  round, and reactive to light.  Neck: Neck supple.  Cardiovascular: Normal rate, regular rhythm and normal heart sounds.   No murmur heard. Pulmonary/Chest: Effort normal. No respiratory distress.  Abdominal: Soft. She exhibits no distension. There is tenderness. There is no rebound.  GENERALIZED tenderness all over the abdomen. + flank tenderness bilaterally. Pt has no focal L spine tenderness, it is diffusely present over L spine.   Neurological: She is alert and oriented to person, place, and time.  Skin: Skin is warm and dry.  Nursing note and vitals reviewed.   ED Course  Procedures (including critical care time) Labs Review Labs Reviewed  COMPREHENSIVE METABOLIC PANEL - Abnormal; Notable for the following:    Potassium 3.2 (*)    CO2 18 (*)    Calcium 7.6 (*)    Total Protein 5.4 (*)  Albumin 2.1 (*)    AST 43 (*)    Alkaline Phosphatase 190 (*)    Anion gap 19 (*)    All other components within normal limits  CBC - Abnormal; Notable for the following:    WBC 12.1 (*)    RBC 3.62 (*)    Hemoglobin 11.4 (*)    HCT 34.0 (*)    All other components within normal limits  URINALYSIS, ROUTINE W REFLEX MICROSCOPIC (NOT AT [facility]) - Abnormal; Notable for the following:    Color, Urine AMBER (*)    APPearance CLOUDY (*)    Hgb urine dipstick SMALL (*)    Bilirubin Urine SMALL (*)    Protein, ur 30 (*)    Leukocytes, UA MODERATE (*)    All other components within normal limits  BLOOD GAS, VENOUS - Abnormal; Notable for the following:    pH, Ven 7.449 (*)    pCO2, Ven 31.1 (*)    All other components within normal limits  URINE MICROSCOPIC-ADD ON - Abnormal; Notable for the following:    Squamous Epithelial / LPF 6-30 (*)    Bacteria, UA MANY (*)    All other components within normal limits  URINE CULTURE  LIPASE, BLOOD  I-STAT BETA HCG BLOOD, ED ([facility], [facility], [facility] ONLY)    Imaging Review Ct Abdomen Pelvis Wo Contrast  01/09/2016  CLINICAL DATA:  Abdominal pain with nausea  and vomiting for 2 days. History of Crohn's disease and pancreatitis EXAM: CT ABDOMEN AND PELVIS WITHOUT CONTRAST TECHNIQUE: Multidetector CT imaging of the abdomen and pelvis was performed following the standard protocol without IV contrast. COMPARISON:  Feb 28, 2015 FINDINGS: Lower chest: There is patchy atelectasis in the lateral left base region. There is a lesser degree of atelectatic change in the posterior and lateral right base regions. Lung bases otherwise are clear. There are multiple foci of coronary artery calcification. Hepatobiliary: There is hepatic steatosis. There is again noted a mass in the dome of the liver anteriorly measuring 2.2 x 1.4 cm. There is minimal calcification along the periphery of this lesion. No other focal lesion is identified on this noncontrast enhanced study. The gallbladder wall is not appreciably thickened. The common bile duct measures 14 mm, stable from prior studies. No biliary duct mass or calculus is evident. Pancreas: There is no pancreatic mass or inflammatory focus. Mild dilatation of the pancreatic duct is a stable finding. Spleen: No splenic lesions are evident. Adrenals/Urinary Tract: Adrenals appear unremarkable and stable bilaterally. There is no hydronephrosis on either side. There is a 4 mm cyst in the periphery of the right kidney. There is a 2 mm calculus in the upper pole of the right kidney. There is a 2 mm calculus in the lower pole of the right kidney. There is a 2 mm calculus in the mid right kidney. On the left, there is a 1 mm calculus in the upper to midportion. No ureteral calculi are identified on either side. The urinary bladder is midline with wall thickness within normal limits. Stomach/Bowel: There is evidence of previous surgery in left pelvis with anastomosis patent. There is no bowel wall or mesenteric thickening. There is no bowel obstruction. No free air or portal venous air. The terminal ileal region appears unremarkable on this study.  Vascular/Lymphatic: There is atherosclerotic calcification throughout the aorta and common iliac arteries. There is also calcification in both external iliac and hypogastric arteries. No abdominal aortic aneurysm is seen. The major mesenteric vessels appear patent  on this noncontrast enhanced study. There is no adenopathy in the abdomen or pelvis. Reproductive: Uterus is anteverted. There is no pelvic mass or pelvic fluid collection. Other: Appendix appears normal. No ascites or abscess is appreciable in the abdomen or pelvis. There is a minimal ventral hernia containing only fat. There is chronic scarring in the posterior perineal region, stable. Musculoskeletal: There is no blastic or lytic bone lesions. There is degenerative type change in the lumbar spine. There is no intramuscular or abdominal wall lesion. IMPRESSION: No bowel obstruction. No appreciable bowel wall thickening. Postoperative changes noted in the left lower pelvis. Appendix appears normal. There are small nonobstructing calculi in each kidney. No hydronephrosis or ureteral calculus on either side. Hepatic steatosis. Well-circumscribed in the dome of the liver, felt to be of benign etiology. This lesion now does show mild peripheral calcification. Question old hematoma. Areas of patchy atelectasis in lung bases, primarily in the lateral left base. This area in the lateral left base potentially could represent the earliest changes of pneumonia. Coronary artery calcification noted. Stable chronic scarring in the posterior perineal region. Electronically Signed   By: Bretta Bang III M.D.   On: 01/09/2016 16:10   I have personally reviewed and evaluated these images and lab results as part of my medical decision-making.   EKG Interpretation None      MDM   Final diagnoses:  Pyelonephritis    Pt comes in with cc of generalized pain and weakness. Pt has multiple medical comorbidities, including crohns and DM. Hx of pancreatitis -  but the concern is low for pancreatitis right now.  She has diffuse abd tenderness. Pt has iv and oral contrast allergy - we will get CT abd and pelvis w/o contrast.  Initial impression is UTI/pyelonephritis - with generalized pain, weakness, flank pain, chills. On the abdomen side, with her hx of crohns, localized infection/abscess/perforation possible - CT scan ordered.  4:00 pm Pt's UA is +. We will give her ceftriaxone. Results discussed with the patient and family. Low probability of cross reactivity between penicillin and cephalosporin also discussed. She never had anaphylaxis type reaction, which is reassuring.  If the CT is neg, pt can be discharged.  The BP that was measured at 1411 - 85/64. Pt was reassessed - although she was being taken to the CT so i didn't get to check her BP. Currently, we dont think patient is in septic shock. I think the BP is possibly erroneous. We will ensure she has the right size cuff and recheck. Only SIRs is WC of 12.1.  4:45 pm: BP measurements for me were 128/80 and 98/78. Drastically different, so we got a smaller cuff and repeated the BP and it is returning as 92/80.  5:15 pm: Pt ready for d/c.  Strict ER return precautions have been discussed, and patient is agreeing with the plan and is comfortable with the workup done and the recommendations from the ER. Pt wants dilaudid prescription. She has been informed that we will not do so. I have agreed to give her oral table now. Her repeat BP is 108/70. Will d/c.  PO challenge passed.  Derwood Kaplan, MD 01/09/16 1623  Derwood Kaplan, MD 01/09/16 1711

## 2016-01-09 NOTE — Discharge Instructions (Signed)
Please take the antibiotics for the infection. Nausea meds have been ordered.  See your doctor for optimal pain control.  Please return to the ER if your symptoms worsen; you have increased pain, fevers, chills, inability to keep any medications down, confusion. fainting. Otherwise see the outpatient doctor as requested.   Pyelonephritis, Adult Pyelonephritis is a kidney infection. The kidneys are the organs that filter a person's blood and move waste out of the bloodstream and into the urine. Urine passes from the kidneys, through the ureters, and into the bladder. There are two main types of pyelonephritis:  Infections that come on quickly without any warning (acute pyelonephritis).  Infections that last for a long period of time (chronic pyelonephritis). In most cases, the infection clears up with treatment and does not cause further problems. More severe infections or chronic infections can sometimes spread to the bloodstream or lead to other problems with the kidneys. CAUSES This condition is usually caused by:  Bacteria traveling from the bladder to the kidney through infected urine. The urine in the bladder can become infected with bacteria from:  Bladder infection (cystitis).  Inflammation of the prostate gland (prostatitis).  Sexual intercourse, in females.  Bacteria traveling from the bloodstream to the kidney. RISK FACTORS This condition is more likely to develop in:  Pregnant women.  Older people.  People who have diabetes.  People who have kidney stones or bladder stones.  People who have other abnormalities of the kidney or ureter.  People who have a catheter placed in the bladder.  People who have cancer.  People who are sexually active.  Women who use spermicides.  People who have had a prior urinary tract infection. SYMPTOMS Symptoms of this condition include:  Frequent urination.  Strong or persistent urge to urinate.  Burning or stinging when  urinating.  Abdominal pain.  Back pain.  Pain in the side or flank area.  Fever.  Chills.  Blood in the urine, or dark urine.  Nausea.  Vomiting. DIAGNOSIS This condition may be diagnosed based on:  Medical history and physical exam.  Urine tests.  Blood tests. You may also have imaging tests of the kidneys, such as an ultrasound or CT scan. TREATMENT Treatment for this condition may depend on the severity of the infection.  If the infection is mild and is found early, you may be treated with antibiotic medicines taken by mouth. You will need to drink fluids to remain hydrated.  If the infection is more severe, you may need to stay in the hospital and receive antibiotics given directly into a vein through an IV tube. You may also need to receive fluids through an IV tube if you are not able to remain hydrated. After your hospital stay, you may need to take oral antibiotics for a period of time. Other treatments may be required, depending on the cause of the infection. HOME CARE INSTRUCTIONS Medicines  Take over-the-counter and prescription medicines only as told by your health care provider.  If you were prescribed an antibiotic medicine, take it as told by your health care provider. Do not stop taking the antibiotic even if you start to feel better. General Instructions  Drink enough fluid to keep your urine clear or pale yellow.  Avoid caffeine, tea, and carbonated beverages. They tend to irritate the bladder.  Urinate often. Avoid holding in urine for long periods of time.  Urinate before and after sex.  After a bowel movement, women should cleanse from front to back.  Use each tissue only once.  Keep all follow-up visits as told by your health care provider. This is important. SEEK MEDICAL CARE IF:  Your symptoms do not get better after 2 days of treatment.  Your symptoms get worse.  You have a fever. SEEK IMMEDIATE MEDICAL CARE IF:  You are unable to  take your antibiotics or fluids.  You have shaking chills.  You vomit.  You have severe flank or back pain.  You have extreme weakness or fainting.   This information is not intended to replace advice given to you by your health care provider. Make sure you discuss any questions you have with your health care provider.   Document Released: 09/17/2005 Document Revised: 06/08/2015 Document Reviewed: 01/10/2015 Elsevier Interactive Patient Education [employer]

## 2016-01-09 NOTE — ED Notes (Signed)
Patient has still not took her PO medication that was provided to her. Informed patient that I would need to watch her take medication before leaving the room. She states she would take her medication but she would take it when she got ready. Informed Macon, RN (Press photographer) that I was unable to leave the room til patient took her medication. Macon, RN came to bedside to observe patient take medication while I attended to other patients.

## 2016-01-09 NOTE — ED Notes (Signed)
Patient was putting on chap stick when entering the room. Introduced myself to patient and informed of the plan of care that was to follow. Pt started off warning that medications would need to be flushed slowly. When starting to flush IV line slow as possible, patient started hollering and reporting this nurse was the only one that didn't understand what slowly meant. Placed fluids and medication on a pump at the recommended rate and patient is calm, not hollering.

## 2016-01-09 NOTE — ED Notes (Signed)
Provided patient a dry swab that she requested, unopened and placed on her table. Pt is complaining, " what am I suppose to do with this". Pt has not took her unopened Dilaudid pill that is on her bedside table was placed there at her request.

## 2016-01-09 NOTE — ED Notes (Addendum)
Provided patient a cup of ice, unwrapped straw, and unopened can of Sprite.

## 2016-01-09 NOTE — ED Notes (Addendum)
Pt requested to keep the tourniquet after labs were drawn for her "collection."    I informed her that she would not be keeping the tourniquet - pt grabbed it out of my hands and put in underneath her.  RN is aware.

## 2016-01-09 NOTE — ED Notes (Signed)
Pt continually sts that she is weak and she needs the doctor to come in and "get a bed upstairs."  Pt informed that the EDP has been tied up, but should be in shortly.  Pt repositioned, a new arm band placed, and IV repositioned per request.  Pt sts that she cannot stop vomiting, but no staff members has witnessed her vomiting.  Pt only spitting into the emesis bag.

## 2016-01-09 NOTE — ED Notes (Signed)
Pt continually calling out.  When staff enters, she will proceed to bad mouth other staff members.  Pt, also, asking for every staff members name.  This RN repositioned IV, removed brief, and adjusted bedding per Pt request.

## 2016-01-09 NOTE — ED Notes (Signed)
Unsuccessful attempt to draw VBG off IV line.  Phlebotomy asked to attempt.

## 2016-01-11 LAB — URINE CULTURE

## 2016-01-29 ENCOUNTER — Encounter (HOSPITAL_COMMUNITY): Payer: Self-pay | Admitting: Emergency Medicine

## 2016-01-29 ENCOUNTER — Inpatient Hospital Stay (HOSPITAL_COMMUNITY): Payer: Medicare Other

## 2016-01-29 ENCOUNTER — Emergency Department (HOSPITAL_COMMUNITY)
Admit: 2016-01-29 | Discharge: 2016-01-29 | Disposition: A | Discharge status: Home / Self Care | Payer: Medicare Other | Attending: Emergency Medicine | Admitting: Emergency Medicine

## 2016-01-29 ENCOUNTER — Inpatient Hospital Stay (HOSPITAL_COMMUNITY)
Admission: EM | Admit: 2016-01-29 | Discharge: 2016-02-08 | DRG: 252 | Disposition: A | Discharge status: Home / Self Care | Payer: Medicare Other | Attending: Internal Medicine | Admitting: Internal Medicine

## 2016-01-29 DIAGNOSIS — I82412 Acute embolism and thrombosis of left femoral vein: Principal | ICD-10-CM | POA: Diagnosis present

## 2016-01-29 DIAGNOSIS — R079 Chest pain, unspecified: Secondary | ICD-10-CM | POA: Diagnosis not present

## 2016-01-29 DIAGNOSIS — K5090 Crohn's disease, unspecified, without complications: Secondary | ICD-10-CM | POA: Diagnosis present

## 2016-01-29 DIAGNOSIS — E43 Unspecified severe protein-calorie malnutrition: Secondary | ICD-10-CM | POA: Diagnosis not present

## 2016-01-29 DIAGNOSIS — R0600 Dyspnea, unspecified: Secondary | ICD-10-CM | POA: Insufficient documentation

## 2016-01-29 DIAGNOSIS — Z792 Long term (current) use of antibiotics: Secondary | ICD-10-CM | POA: Diagnosis not present

## 2016-01-29 DIAGNOSIS — Z9181 History of falling: Secondary | ICD-10-CM | POA: Diagnosis not present

## 2016-01-29 DIAGNOSIS — I82402 Acute embolism and thrombosis of unspecified deep veins of left lower extremity: Secondary | ICD-10-CM | POA: Diagnosis not present

## 2016-01-29 DIAGNOSIS — N179 Acute kidney failure, unspecified: Secondary | ICD-10-CM | POA: Diagnosis present

## 2016-01-29 DIAGNOSIS — H409 Unspecified glaucoma: Secondary | ICD-10-CM | POA: Diagnosis not present

## 2016-01-29 DIAGNOSIS — Z7901 Long term (current) use of anticoagulants: Secondary | ICD-10-CM | POA: Diagnosis not present

## 2016-01-29 DIAGNOSIS — E785 Hyperlipidemia, unspecified: Secondary | ICD-10-CM | POA: Diagnosis present

## 2016-01-29 DIAGNOSIS — I82432 Acute embolism and thrombosis of left popliteal vein: Secondary | ICD-10-CM | POA: Diagnosis present

## 2016-01-29 DIAGNOSIS — E118 Type 2 diabetes mellitus with unspecified complications: Secondary | ICD-10-CM

## 2016-01-29 DIAGNOSIS — I824Z1 Acute embolism and thrombosis of unspecified deep veins of right distal lower extremity: Secondary | ICD-10-CM | POA: Diagnosis not present

## 2016-01-29 DIAGNOSIS — E039 Hypothyroidism, unspecified: Secondary | ICD-10-CM | POA: Diagnosis present

## 2016-01-29 DIAGNOSIS — Z681 Body mass index (BMI) 19 or less, adult: Secondary | ICD-10-CM | POA: Diagnosis not present

## 2016-01-29 DIAGNOSIS — M79605 Pain in left leg: Secondary | ICD-10-CM | POA: Diagnosis not present

## 2016-01-29 DIAGNOSIS — M79609 Pain in unspecified limb: Secondary | ICD-10-CM | POA: Diagnosis not present

## 2016-01-29 DIAGNOSIS — E872 Acidosis: Secondary | ICD-10-CM | POA: Diagnosis not present

## 2016-01-29 DIAGNOSIS — E871 Hypo-osmolality and hyponatremia: Secondary | ICD-10-CM | POA: Diagnosis present

## 2016-01-29 DIAGNOSIS — N390 Urinary tract infection, site not specified: Secondary | ICD-10-CM | POA: Diagnosis present

## 2016-01-29 DIAGNOSIS — J45909 Unspecified asthma, uncomplicated: Secondary | ICD-10-CM | POA: Diagnosis present

## 2016-01-29 DIAGNOSIS — E1140 Type 2 diabetes mellitus with diabetic neuropathy, unspecified: Secondary | ICD-10-CM | POA: Diagnosis present

## 2016-01-29 DIAGNOSIS — I82422 Acute embolism and thrombosis of left iliac vein: Secondary | ICD-10-CM | POA: Diagnosis not present

## 2016-01-29 DIAGNOSIS — Z91041 Radiographic dye allergy status: Secondary | ICD-10-CM | POA: Diagnosis not present

## 2016-01-29 DIAGNOSIS — N200 Calculus of kidney: Secondary | ICD-10-CM | POA: Diagnosis not present

## 2016-01-29 DIAGNOSIS — Z23 Encounter for immunization: Secondary | ICD-10-CM | POA: Diagnosis not present

## 2016-01-29 DIAGNOSIS — I82442 Acute embolism and thrombosis of left tibial vein: Secondary | ICD-10-CM | POA: Diagnosis present

## 2016-01-29 DIAGNOSIS — Z86718 Personal history of other venous thrombosis and embolism: Secondary | ICD-10-CM

## 2016-01-29 DIAGNOSIS — Z86711 Personal history of pulmonary embolism: Secondary | ICD-10-CM | POA: Diagnosis present

## 2016-01-29 DIAGNOSIS — I82492 Acute embolism and thrombosis of other specified deep vein of left lower extremity: Secondary | ICD-10-CM | POA: Diagnosis not present

## 2016-01-29 DIAGNOSIS — M545 Low back pain: Secondary | ICD-10-CM | POA: Diagnosis not present

## 2016-01-29 DIAGNOSIS — I10 Essential (primary) hypertension: Secondary | ICD-10-CM | POA: Diagnosis not present

## 2016-01-29 DIAGNOSIS — K219 Gastro-esophageal reflux disease without esophagitis: Secondary | ICD-10-CM | POA: Diagnosis present

## 2016-01-29 DIAGNOSIS — D649 Anemia, unspecified: Secondary | ICD-10-CM | POA: Diagnosis present

## 2016-01-29 DIAGNOSIS — L89312 Pressure ulcer of right buttock, stage 2: Secondary | ICD-10-CM | POA: Diagnosis not present

## 2016-01-29 DIAGNOSIS — I871 Compression of vein: Secondary | ICD-10-CM | POA: Diagnosis present

## 2016-01-29 DIAGNOSIS — K760 Fatty (change of) liver, not elsewhere classified: Secondary | ICD-10-CM | POA: Diagnosis present

## 2016-01-29 DIAGNOSIS — R0602 Shortness of breath: Secondary | ICD-10-CM | POA: Diagnosis not present

## 2016-01-29 DIAGNOSIS — Z87440 Personal history of urinary (tract) infections: Secondary | ICD-10-CM | POA: Diagnosis not present

## 2016-01-29 DIAGNOSIS — M7989 Other specified soft tissue disorders: Secondary | ICD-10-CM | POA: Diagnosis not present

## 2016-01-29 DIAGNOSIS — I878 Other specified disorders of veins: Secondary | ICD-10-CM | POA: Insufficient documentation

## 2016-01-29 DIAGNOSIS — F17210 Nicotine dependence, cigarettes, uncomplicated: Secondary | ICD-10-CM | POA: Diagnosis present

## 2016-01-29 DIAGNOSIS — R1084 Generalized abdominal pain: Secondary | ICD-10-CM | POA: Diagnosis not present

## 2016-01-29 DIAGNOSIS — I82409 Acute embolism and thrombosis of unspecified deep veins of unspecified lower extremity: Secondary | ICD-10-CM | POA: Diagnosis not present

## 2016-01-29 DIAGNOSIS — R109 Unspecified abdominal pain: Secondary | ICD-10-CM | POA: Diagnosis not present

## 2016-01-29 DIAGNOSIS — R600 Localized edema: Secondary | ICD-10-CM | POA: Diagnosis not present

## 2016-01-29 LAB — COMPREHENSIVE METABOLIC PANEL
ALBUMIN: 2.1 g/dL — AB (ref 3.5–5.0)
ALK PHOS: 258 U/L — AB (ref 38–126)
ALT: 73 U/L — ABNORMAL HIGH (ref 14–54)
ANION GAP: 19 — AB (ref 5–15)
AST: 478 U/L — ABNORMAL HIGH (ref 15–41)
BILIRUBIN TOTAL: 1.3 mg/dL — AB (ref 0.3–1.2)
BUN: 13 mg/dL (ref 6–20)
CALCIUM: 7.7 mg/dL — AB (ref 8.9–10.3)
CO2: 18 mmol/L — ABNORMAL LOW (ref 22–32)
Chloride: 95 mmol/L — ABNORMAL LOW (ref 101–111)
Creatinine, Ser: 1.34 mg/dL — ABNORMAL HIGH (ref 0.44–1.00)
GFR, EST AFRICAN AMERICAN: 50 mL/min — AB (ref >60)
GFR, EST NON AFRICAN AMERICAN: 43 mL/min — AB (ref >60)
GLUCOSE: 125 mg/dL — AB (ref 65–99)
POTASSIUM: 4.5 mmol/L (ref 3.5–5.1)
Sodium: 132 mmol/L — ABNORMAL LOW (ref 135–145)
TOTAL PROTEIN: 5.9 g/dL — AB (ref 6.5–8.1)

## 2016-01-29 LAB — CBC WITH DIFFERENTIAL/PLATELET
BASOS PCT: 0 %
Basophils Absolute: 0 10*3/uL (ref 0.0–0.1)
EOS ABS: 0 10*3/uL (ref 0.0–0.7)
Eosinophils Relative: 0 %
HCT: 34.7 % — ABNORMAL LOW (ref 36.0–46.0)
Hemoglobin: 11.5 g/dL — ABNORMAL LOW (ref 12.0–15.0)
Lymphocytes Relative: 9 %
Lymphs Abs: 1.7 10*3/uL (ref 0.7–4.0)
MCH: 32.9 pg (ref 26.0–34.0)
MCHC: 33.1 g/dL (ref 30.0–36.0)
MCV: 99.1 fL (ref 78.0–100.0)
Monocytes Absolute: 0.9 10*3/uL (ref 0.1–1.0)
Monocytes Relative: 5 %
NEUTROS ABS: 15.6 10*3/uL — AB (ref 1.7–7.7)
NEUTROS PCT: 86 %
PLATELETS: 121 10*3/uL — AB (ref 150–400)
RBC: 3.5 MIL/uL — AB (ref 3.87–5.11)
RDW: 15.4 % (ref 11.5–15.5)
WBC: 18.3 10*3/uL — ABNORMAL HIGH (ref 4.0–10.5)

## 2016-01-29 LAB — HEPARIN LEVEL (UNFRACTIONATED): Heparin Unfractionated: 0.97 IU/mL — ABNORMAL HIGH (ref 0.30–0.70)

## 2016-01-29 LAB — PROTIME-INR
INR: 1.27 (ref 0.00–1.49)
PROTHROMBIN TIME: 16 s — AB (ref 11.6–15.2)

## 2016-01-29 MED ORDER — HYDROMORPHONE HCL 1 MG/ML IJ SOLN
1.0000 mg | Freq: Once | INTRAMUSCULAR | Status: AC
  Administered 2016-01-29: 1 mg via INTRAVENOUS
  Filled 2016-01-29: qty 1

## 2016-01-29 MED ORDER — HEPARIN (PORCINE) IN NACL 100-0.45 UNIT/ML-% IJ SOLN
900.0000 [IU]/h | INTRAMUSCULAR | Status: DC
  Administered 2016-01-29: 900 [IU]/h via INTRAVENOUS

## 2016-01-29 MED ORDER — SODIUM CHLORIDE 0.9 % IV SOLN
INTRAVENOUS | Status: DC
  Administered 2016-01-29: 13:00:00 via INTRAVENOUS

## 2016-01-29 MED ORDER — ACETAMINOPHEN 325 MG PO TABS
650.0000 mg | ORAL_TABLET | Freq: Four times a day (QID) | ORAL | Status: DC | PRN
  Administered 2016-02-03 – 2016-02-04 (×2): 650 mg via ORAL
  Filled 2016-01-29 (×2): qty 2

## 2016-01-29 MED ORDER — HYDROMORPHONE HCL 2 MG/ML IJ SOLN
2.0000 mg | INTRAMUSCULAR | Status: DC | PRN
  Administered 2016-01-29: 2 mg via INTRAVENOUS
  Filled 2016-01-29: qty 1

## 2016-01-29 MED ORDER — GABAPENTIN 100 MG PO CAPS
100.0000 mg | ORAL_CAPSULE | Freq: Two times a day (BID) | ORAL | Status: DC
  Administered 2016-01-29 – 2016-02-08 (×20): 100 mg via ORAL
  Filled 2016-01-29 (×21): qty 1

## 2016-01-29 MED ORDER — TRAZODONE HCL 50 MG PO TABS
25.0000 mg | ORAL_TABLET | Freq: Every evening | ORAL | Status: DC | PRN
  Administered 2016-01-30 – 2016-02-04 (×5): 25 mg via ORAL
  Administered 2016-02-06: 50 mg via ORAL
  Administered 2016-02-07: 25 mg via ORAL
  Filled 2016-01-29 (×8): qty 1

## 2016-01-29 MED ORDER — ACETAMINOPHEN 650 MG RE SUPP: 650.0000 mg | Freq: Four times a day (QID) | RECTAL | Status: DC | PRN

## 2016-01-29 MED ORDER — SODIUM CHLORIDE 0.9% FLUSH
3.0000 mL | Freq: Two times a day (BID) | INTRAVENOUS | Status: DC
  Administered 2016-01-30 – 2016-02-02 (×6): 3 mL via INTRAVENOUS

## 2016-01-29 MED ORDER — PNEUMOCOCCAL VAC POLYVALENT 25 MCG/0.5ML IJ INJ
0.5000 mL | INJECTION | INTRAMUSCULAR | Status: DC
  Filled 2016-01-29 (×2): qty 0.5

## 2016-01-29 MED ORDER — ONDANSETRON HCL 4 MG/2ML IJ SOLN: 4.0000 mg | Freq: Four times a day (QID) | INTRAMUSCULAR | Status: DC | PRN

## 2016-01-29 MED ORDER — VENLAFAXINE HCL 25 MG PO TABS
25.0000 mg | ORAL_TABLET | Freq: Two times a day (BID) | ORAL | Status: DC
  Administered 2016-01-29 – 2016-02-08 (×20): 25 mg via ORAL
  Filled 2016-01-29 (×23): qty 1

## 2016-01-29 MED ORDER — SODIUM CHLORIDE 0.9 % IV SOLN
INTRAVENOUS | Status: DC
  Administered 2016-01-29 – 2016-01-30 (×2): via INTRAVENOUS

## 2016-01-29 MED ORDER — HEPARIN BOLUS VIA INFUSION
1500.0000 [IU] | Freq: Once | INTRAVENOUS | Status: AC
  Administered 2016-01-29: 1500 [IU] via INTRAVENOUS
  Filled 2016-01-29: qty 1500

## 2016-01-29 MED ORDER — HEPARIN (PORCINE) IN NACL 100-0.45 UNIT/ML-% IJ SOLN
900.0000 [IU]/h | INTRAMUSCULAR | Status: DC
  Administered 2016-01-29: 900 [IU]/h via INTRAVENOUS
  Filled 2016-01-29 (×2): qty 250

## 2016-01-29 MED ORDER — OXYCODONE HCL 5 MG PO TABS
10.0000 mg | ORAL_TABLET | ORAL | Status: DC | PRN
  Administered 2016-01-30 – 2016-02-06 (×30): 10 mg via ORAL
  Filled 2016-01-29 (×31): qty 2

## 2016-01-29 MED ORDER — LEVOTHYROXINE SODIUM 25 MCG PO TABS
25.0000 ug | ORAL_TABLET | Freq: Every day | ORAL | Status: DC
  Administered 2016-01-30 – 2016-02-08 (×10): 25 ug via ORAL
  Filled 2016-01-29 (×11): qty 1

## 2016-01-29 MED ORDER — ONDANSETRON HCL 4 MG PO TABS: 4.0000 mg | ORAL_TABLET | Freq: Four times a day (QID) | ORAL | Status: DC | PRN

## 2016-01-29 NOTE — ED Notes (Signed)
Pt c/o severe pain to L leg with swelling. Pt states she usually ambulates from bed to bathroom on own, but now unable to stand or ambulate.

## 2016-01-29 NOTE — Progress Notes (Addendum)
VASCULAR LAB PRELIMINARY  PRELIMINARY  PRELIMINARY  PRELIMINARY  Bilateral lower extremity venous duplex completed.    Preliminary report:  There is acute, occlusive DVT noted throughout the left lower extremity including the posterior tibial, peroneal, popliteal, femoral, and common femoral veins.  Reported results to Dr. Ardelle Park, [facility], RVT 01/29/2016, 2:28 PM

## 2016-01-29 NOTE — ED Notes (Signed)
Bed: KZ60 Expected date: 01/29/16 Expected time: 11:48 AM Means of arrival:  Comments: Leg swelling

## 2016-01-29 NOTE — H&P (Signed)
History and Physical    [name] WGN:[id number] DOB: [birth year] DOA: 01/29/2016  Referring MD/NP/PA:  PCP: No primary care provider on file.  Outpatient Specialists:  Patient coming from: Home  Chief Complaint: Left lower extremity swelling/erythema/pain  HPI: [name] is a 55 y.o. female with multiple comorbidities including type 2 diabetes mellitus, hypothyroidism, severe protein calorie malnutrition, history of pulmonary embolism rate is a treated with ELiquis, presenting to the emergency department with complaints of severe pain involving entire left lower extremity associated with swelling and erythema. She states that her symptoms started 2 days ago and have become progressively worse over time, not having 10/10 pain, affecting her ability to mobilize. She also complains of shortness of breath and pleuritic-type chest pain. She had not been on anticoagulation prior to this presentation.   ED Course: Left lower extremity ultrasound performed in the emergency room revealed an acute occlusive DVT throughout the left lower extremity including posterior tibial, peroneal, popliteal, femoral and common femoral veins. She was started on IV heparin. I spoke with Dr. Bonnielee Haff of interventional radiology who agreed that she might be a candidate for thrombectomy.   Review of Systems: As per HPI otherwise 10 point review of systems negative.    Past Medical History  Diagnosis Date  . Glaucoma   . Diabetes mellitus without complication (HCC)   . Hypertension   . Neuropathy (HCC)   . Crohn disease (HCC)   . Asthma   . Pulmonary embolus (HCC) 05/24/2014  . Hypothyroidism   . GERD (gastroesophageal reflux disease)   . HLD (hyperlipidemia)   . C. difficile colitis   . Pancreatitis   . Asthma   . Tobacco abuse     Past Surgical History  Procedure Laterality Date  . Cesarean section    . Esophagogastroduodenoscopy N/A 05/21/2014    Procedure: ESOPHAGOGASTRODUODENOSCOPY (EGD);  Surgeon:  Petra Kuba, MD;  Location: [facility] ENDOSCOPY;  Service: Endoscopy;  Laterality: N/A;  . Esophagogastroduodenoscopy N/A 03/02/2015    Procedure: ESOPHAGOGASTRODUODENOSCOPY (EGD);  Surgeon: Bernette Redbird, MD;  Location: [facility] ENDOSCOPY;  Service: Endoscopy;  Laterality: N/A;  . Colonoscopy N/A 03/02/2015    Procedure: COLONOSCOPY;  Surgeon: Bernette Redbird, MD;  Location: [facility] ENDOSCOPY;  Service: Endoscopy;  Laterality: N/A;     reports that she has been smoking Cigarettes.  She has a 6.5 pack-year smoking history. She has never used smokeless tobacco. She reports that she does not drink alcohol or use illicit drugs.  Allergies  Allergen Reactions  . Iohexol Other (See Comments)    "severe burning" Patient has received Contrast in 2005 with 13 hour pre-medication, and had no reaction at that time  . Spiriva Handihaler [Tiotropium Bromide Monohydrate] Nausea And Vomiting  . Ativan [Lorazepam] Other (See Comments)    hallucinations  . Penicillins Hives    Family History  Problem Relation Age of Onset  . Breast cancer Mother   . Heart disease Mother 36    CABG  . Diabetes Mother   . Heart disease Father 82    CABG  . Diabetes Father     Prior to Admission medications   Medication Sig Start Date End Date Taking? Authorizing Provider  calcium carbonate (OS-CAL - DOSED IN MG OF ELEMENTAL CALCIUM) 1250 (500 CA) MG tablet Take 1 tablet (500 mg of elemental calcium total) by mouth daily with breakfast. 03/04/15  Yes Alison Murray, MD  feeding supplement, RESOURCE BREEZE, (RESOURCE BREEZE) LIQD Take 1 Container by mouth 3 (three) times  daily between meals. 08/02/14  Yes Kathlen Mody, MD  folic acid (FOLVITE) 1 MG tablet Take 1 tablet (1 mg total) by mouth daily. 08/02/14  Yes Kathlen Mody, MD  gabapentin (NEURONTIN) 100 MG capsule Take 1 capsule (100 mg total) by mouth 2 (two) times daily. 04/05/15  Yes Vassie Loll, MD  HYDROcodone-acetaminophen (NORCO/VICODIN) 5-325 MG tablet Take 1 tablet by mouth every  6 (six) hours as needed. 01/09/16  Yes Derwood Kaplan, MD  HYDROmorphone (DILAUDID) 4 MG tablet Take 8 mg by mouth every 4 (four) hours as needed for severe pain.   Yes Historical Provider, MD  levothyroxine (SYNTHROID, LEVOTHROID) 25 MCG tablet Take 1 tablet (25 mcg total) by mouth daily before breakfast. 08/02/14  Yes Kathlen Mody, MD  meclizine (ANTIVERT) 25 MG tablet Take 25 mg by mouth 3 (three) times daily as needed for dizziness.   Yes Historical Provider, MD  ondansetron (ZOFRAN ODT) 4 MG disintegrating tablet Take 1 tablet (4 mg total) by mouth every 8 (eight) hours as needed for nausea or vomiting. 01/09/16  Yes Ankit Nanavati, MD  ondansetron (ZOFRAN) 4 MG tablet Take 4 mg by mouth every 8 (eight) hours as needed for nausea or vomiting.   Yes Historical Provider, MD  oxyCODONE-acetaminophen (PERCOCET/ROXICET) 5-325 MG per tablet Take 1 tablet by mouth every 6 (six) hours as needed for moderate pain. 04/05/15  Yes Vassie Loll, MD  pantoprazole (PROTONIX) 20 MG tablet Take 40 mg by mouth daily.   Yes Historical Provider, MD  rosuvastatin (CRESTOR) 20 MG tablet Take 1 tablet (20 mg total) by mouth daily. 08/02/14  Yes Kathlen Mody, MD  saccharomyces boulardii (FLORASTOR) 250 MG capsule Take 1 capsule (250 mg total) by mouth 2 (two) times daily. 04/05/15  Yes Vassie Loll, MD  thiamine (VITAMIN B-1) 100 MG tablet Take 1 tablet (100 mg total) by mouth daily. 08/02/14  Yes Kathlen Mody, MD  traZODone (DESYREL) 25 mg TABS tablet Take 0.5 tablets (25 mg total) by mouth at bedtime as needed for sleep. 08/02/14  Yes Kathlen Mody, MD  venlafaxine (EFFEXOR) 25 MG tablet Take 1 tablet (25 mg total) by mouth 2 (two) times daily. 03/04/15  Yes Alison Murray, MD  apixaban (ELIQUIS) 2.5 MG TABS tablet Take 1 tablet (2.5 mg total) by mouth 2 (two) times daily. Patient not taking: Reported on 01/09/2016 04/05/15   Vassie Loll, MD  cephALEXin (KEFLEX) 500 MG capsule Take 1 capsule (500 mg total) by mouth 2 (two) times  daily. Patient not taking: Reported on 01/29/2016 01/09/16   Derwood Kaplan, MD  lactulose (CHRONULAC) 10 GM/15ML solution Take 15 mLs (10 g total) by mouth daily as needed for moderate constipation. Patient not taking: Reported on 01/09/2016 04/05/15   Vassie Loll, MD  metoprolol tartrate (LOPRESSOR) 25 MG tablet Take 0.5 tablets (12.5 mg total) by mouth 2 (two) times daily. Patient not taking: Reported on 01/09/2016 04/05/15   Vassie Loll, MD    Physical Exam: Filed Vitals:   01/29/16 1221 01/29/16 1421  BP: 135/91 133/78  Pulse: 74 77  Temp: 98.1 F (36.7 C)   TempSrc: Oral   Resp: 18 18  Height: 5\' 5"  (1.651 m)   Weight: 38.102 kg (84 lb)   SpO2: 94% 92%      Constitutional: She is in no acute distress, alert and oriented.  Filed Vitals:   01/29/16 1221 01/29/16 1421  BP: 135/91 133/78  Pulse: 74 77  Temp: 98.1 F (36.7 C)   TempSrc: Oral  Resp: 18 18  Height: 5\' 5"  (1.651 m)   Weight: 38.102 kg (84 lb)   SpO2: 94% 92%   Eyes: PERRL, lids and conjunctivae normal ENMT: Mucous membranes are moist. Posterior pharynx clear of any exudate or lesions.Normal dentition.  Neck: normal, supple, no masses, no thyromegaly Respiratory: clear to auscultation bilaterally, no wheezing, no crackles. Normal respiratory effort. No accessory muscle use.  Cardiovascular: Regular rate and rhythm, no murmurs / rubs / gallops. No extremity edema. 2+ pedal pulses. No carotid bruits.  Abdomen: no tenderness, no masses palpated. No hepatosplenomegaly. Bowel sounds positive.  Musculoskeletal: She has extensive swelling on her left lower extremity extending from her left foot TO pelvic region. The area is erythematous and is quite tender to palpation. There is localized heat. Skin: Erythema involving her entire left lower extremity Neurologic: CN 2-12 grossly intact. Sensation intact, DTR normal. Strength 5/5 in all 4.  Psychiatric: Normal judgment and insight. Alert and oriented x 3. Normal mood.      Labs on Admission: I have personally reviewed following labs and imaging studies  CBC:  Recent Labs Lab 01/29/16 1252  WBC 18.3*  NEUTROABS 15.6*  HGB 11.5*  HCT 34.7*  MCV 99.1  PLT 121*   Basic Metabolic Panel:  Recent Labs Lab 01/29/16 1252  NA 132*  K 4.5  CL 95*  CO2 18*  GLUCOSE 125*  BUN 13  CREATININE 1.34*  CALCIUM 7.7*   GFR: Estimated Creatinine Clearance: 27.9 mL/min (by C-G formula based on Cr of 1.34). Liver Function Tests:  Recent Labs Lab 01/29/16 1252  AST 478*  ALT 73*  ALKPHOS 258*  BILITOT 1.3*  PROT 5.9*  ALBUMIN 2.1*   No results for input(s): LIPASE, AMYLASE in the last 168 hours. No results for input(s): AMMONIA in the last 168 hours. Coagulation Profile:  Recent Labs Lab 01/29/16 1252  INR 1.27   Cardiac Enzymes: No results for input(s): CKTOTAL, CKMB, CKMBINDEX, TROPONINI in the last 168 hours. BNP (last 3 results) No results for input(s): PROBNP in the last 8760 hours. HbA1C: No results for input(s): HGBA1C in the last 72 hours. CBG: No results for input(s): GLUCAP in the last 168 hours. Lipid Profile: No results for input(s): CHOL, HDL, LDLCALC, TRIG, CHOLHDL, LDLDIRECT in the last 72 hours. Thyroid Function Tests: No results for input(s): TSH, T4TOTAL, FREET4, T3FREE, THYROIDAB in the last 72 hours. Anemia Panel: No results for input(s): VITAMINB12, FOLATE, FERRITIN, TIBC, IRON, RETICCTPCT in the last 72 hours. Urine analysis:    Component Value Date/Time   COLORURINE AMBER* 01/09/2016 1404   APPEARANCEUR CLOUDY* 01/09/2016 1404   LABSPEC 1.030 01/09/2016 1404   PHURINE 5.5 01/09/2016 1404   GLUCOSEU NEGATIVE 01/09/2016 1404   HGBUR SMALL* 01/09/2016 1404   BILIRUBINUR SMALL* 01/09/2016 1404   KETONESUR NEGATIVE 01/09/2016 1404   PROTEINUR 30* 01/09/2016 1404   UROBILINOGEN 1.0 02/28/2015 1227   NITRITE NEGATIVE 01/09/2016 1404   LEUKOCYTESUR MODERATE* 01/09/2016 1404   Sepsis  Labs: @LABRCNTIP (procalcitonin:4,lacticidven:4) )No results found for this or any previous visit (from the past 240 hour(s)).   Radiological Exams on Admission: No results found.  EKG: Independently reviewed.   Assessment/Plan Principal Problem:   DVT (deep venous thrombosis), unspecified laterality Active Problems:   DM2 (diabetes mellitus, type 2) (HCC)   HTN (hypertension)   Normocytic anemia   History of pulmonary embolus (PE)   Severe protein-calorie malnutrition (HCC)  1.  Extensive left lower extremity deep venous thrombosis. [name] presenting to the emergency  department complaining of severe left lower extremity pain, swelling, erythema. Symptoms started 2 days ago. Ultrasound performed in the emergency room revealed an extensive DVT affecting posterior tibial, peroneal, popliteal, femoral and bilateral common femoral veins. She also complains of some shortness of breath and pleuritic-type chest pain making pulmonary embolism a concern. She will be started on IV heparin. I spoke with Dr. Bonnielee Haff of interventional radiology who agreed that [name]. [name] may be a candidate for thrombectomy. She'll be evaluated by IR tomorrow and possibly transferred to [facility] to undergo procedure. Meanwhile will check a CT to assess for PE  2. History of GI bleed.  He was hospitalized in June 2016 for GI bleed while on Eliquis therapy. During that hospitalization she received blood transfusion. Eliquis had been stopped later in the year. We'll monitor closely as she will be started on IV heparin during this hospitalization.  3.  Hypertension. Blood pressure stable in the emergency room, plan to monitor blood pressures off of antihypertensive agents  4.  Acute kidney injury. Presenting lab work showing a creatinine of 1.34, previously 0.89. Could be related to prerenal azotemia, provide gentle IV fluid hydration overnight.  5.  Elevated transaminases. Lab work showing AST of 470 with ALT of  73. Looking back at records she has a history of elevated liver LFTs. A recent CT scan performed on 01/09/2016 of abdomen and pelvis revealed hepatic steatosis. Radiology reported a lesion in the dome of the liver felt to be an old hematoma. Will discontinue statin therapy, repeat labs in a.m.  6.  Leukocytosis. Lab work showing a white count of 18,300, suspect related to inflammatory response from large left lower extremity DVT    DVT prophylaxis: She will be fully anticoagulated Code Status: Full code Family Communication: Family not present Disposition Plan: Will admit to the inpatient service Consults called: Interventional radiology Dr Orest Dikes Admission status: Admit to telemetry   Jeralyn Bennett MD [facility] Pager [phone]  If 7PM-7AM, please contact night-coverage [email or web address] Password [email or web address]  01/29/2016, 4:11 PM

## 2016-01-29 NOTE — Progress Notes (Signed)
Took patient down to nuclear med. For VQ scan. Pt was uncooperative, anxious, not following commands. This RN, nurse tech Fayrene Fearing, and nuclear med tech were able to transfer patient to table for scan. She became more cooperative and followed commands for a short while. The nuclear med tech called my phone when finished and I went back to assist w/transfer of pt back to bed and was informed that they were unable to complete the scan. MD on call paged, pt back in room and bed alarm on, will continue to monitor patient.

## 2016-01-29 NOTE — Progress Notes (Signed)
ANTICOAGULATION CONSULT NOTE - Initial Consult  Pharmacy Consult for Heparin Indication: DVT  Allergies  Allergen Reactions  . Iohexol Other (See Comments)    "severe burning" Patient has received Contrast in 2005 with 13 hour pre-medication, and had no reaction at that time  . Spiriva Handihaler [Tiotropium Bromide Monohydrate] Nausea And Vomiting  . Ativan [Lorazepam] Other (See Comments)    hallucinations  . Penicillins Hives    Patient Measurements: Height: 5\' 5"  (165.1 cm) Weight: 84 lb (38.102 kg) IBW/kg (Calculated) : 57 Heparin Dosing Weight: actual weight  Vital Signs: Temp: 98.1 F (36.7 C) (04/30 1221) Temp Source: Oral (04/30 1221) BP: 133/78 mmHg (04/30 1421) Pulse Rate: 77 (04/30 1421)  Labs:  Recent Labs  01/29/16 1252  HGB 11.5*  HCT 34.7*  PLT 121*  LABPROT 16.0*  INR 1.27  CREATININE 1.34*    Estimated Creatinine Clearance: 27.9 mL/min (by C-G formula based on Cr of 1.34).   Medical History: Past Medical History  Diagnosis Date  . Glaucoma   . Diabetes mellitus without complication (HCC)   . Hypertension   . Neuropathy (HCC)   . Crohn disease (HCC)   . Asthma   . Pulmonary embolus (HCC) 05/24/2014  . Hypothyroidism   . GERD (gastroesophageal reflux disease)   . HLD (hyperlipidemia)   . C. difficile colitis   . Pancreatitis   . Asthma   . Tobacco abuse     Medications:  Scheduled:  . heparin  1,500 Units Intravenous Once   Infusions:  . sodium chloride 125 mL/hr at 01/29/16 1251  . 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  Pharmacy is consulted to dose IV heparin. Vascular surgery is consulted to evaluate if patient is a candidate for thrombectomy.  Goal of Therapy:  Heparin level 0.3-0.7 units/ml Monitor platelets by anticoagulation protocol: Yes   Plan:   Heparin 1500 units IV bolus  Heparin IV infusion 900 units/hr  Check  heparin level in 8hrs  Daily heparin level and CBC  Loralee Pacas, PharmD, BCPS Pager: [phone] 01/29/2016,2:56 PM

## 2016-01-29 NOTE — ED Provider Notes (Signed)
CSN: [id number]     Arrival date & time 01/29/16  1157 History   First MD Initiated Contact with Patient 01/29/16 1210     Chief Complaint  Patient presents with  . Leg Swelling  . Leg Pain   HPI Patient has history of multiple chronic medical problems. SHe presents to the emergency room today with complaints of left lower extremity pain and swelling. Symptoms started a couple days ago. She was hoping the pain and swelling would resolve on its own. Today however the symptoms have worsened. She is having increasing pain that is very tender to the touch. She cannot stand without having severe pain. The swelling extends from her foot all the way up towards the thigh. She denies any fevers. She denies any chest pain. She's had some intermittent shortness of breath.  She does have a history of PE. Past Medical History  Diagnosis Date  . Glaucoma   . Diabetes mellitus without complication (HCC)   . Hypertension   . Neuropathy (HCC)   . Crohn disease (HCC)   . Asthma   . Pulmonary embolus (HCC) 05/24/2014  . Hypothyroidism   . GERD (gastroesophageal reflux disease)   . HLD (hyperlipidemia)   . C. difficile colitis   . Pancreatitis   . Asthma   . Tobacco abuse    Past Surgical History  Procedure Laterality Date  . Cesarean section    . Esophagogastroduodenoscopy N/A 05/21/2014    Procedure: ESOPHAGOGASTRODUODENOSCOPY (EGD);  Surgeon: Petra Kuba, MD;  Location: [facility] ENDOSCOPY;  Service: Endoscopy;  Laterality: N/A;  . Esophagogastroduodenoscopy N/A 03/02/2015    Procedure: ESOPHAGOGASTRODUODENOSCOPY (EGD);  Surgeon: Bernette Redbird, MD;  Location: [facility] ENDOSCOPY;  Service: Endoscopy;  Laterality: N/A;  . Colonoscopy N/A 03/02/2015    Procedure: COLONOSCOPY;  Surgeon: Bernette Redbird, MD;  Location: [facility] ENDOSCOPY;  Service: Endoscopy;  Laterality: N/A;   Family History  Problem Relation Age of Onset  . Breast cancer Mother   . Heart disease Mother 64    CABG  . Diabetes Mother   . Heart disease  Father 85    CABG  . Diabetes Father    Social History  Substance Use Topics  . Smoking status: Current Every Day Smoker -- 0.25 packs/day for 26 years    Types: Cigarettes  . Smokeless tobacco: Never Used  . Alcohol Use: No   OB History    No data available     Review of Systems  All other systems reviewed and are negative.     Allergies  Iohexol; Spiriva handihaler; Ativan; and Penicillins  Home Medications   Prior to Admission medications   Medication Sig Start Date End Date Taking? Authorizing Provider  calcium carbonate (OS-CAL - DOSED IN MG OF ELEMENTAL CALCIUM) 1250 (500 CA) MG tablet Take 1 tablet (500 mg of elemental calcium total) by mouth daily with breakfast. 03/04/15  Yes Alison Murray, MD  feeding supplement, RESOURCE BREEZE, (RESOURCE BREEZE) LIQD Take 1 Container by mouth 3 (three) times daily between meals. 08/02/14  Yes Kathlen Mody, MD  folic acid (FOLVITE) 1 MG tablet Take 1 tablet (1 mg total) by mouth daily. 08/02/14  Yes Kathlen Mody, MD  gabapentin (NEURONTIN) 100 MG capsule Take 1 capsule (100 mg total) by mouth 2 (two) times daily. 04/05/15  Yes Vassie Loll, MD  HYDROcodone-acetaminophen (NORCO/VICODIN) 5-325 MG tablet Take 1 tablet by mouth every 6 (six) hours as needed. 01/09/16  Yes Derwood Kaplan, MD  HYDROmorphone (DILAUDID) 4 MG tablet Take  8 mg by mouth every 4 (four) hours as needed for severe pain.   Yes Historical Provider, MD  levothyroxine (SYNTHROID, LEVOTHROID) 25 MCG tablet Take 1 tablet (25 mcg total) by mouth daily before breakfast. 08/02/14  Yes Kathlen Mody, MD  meclizine (ANTIVERT) 25 MG tablet Take 25 mg by mouth 3 (three) times daily as needed for dizziness.   Yes Historical Provider, MD  ondansetron (ZOFRAN ODT) 4 MG disintegrating tablet Take 1 tablet (4 mg total) by mouth every 8 (eight) hours as needed for nausea or vomiting. 01/09/16  Yes Ankit Nanavati, MD  ondansetron (ZOFRAN) 4 MG tablet Take 4 mg by mouth every 8 (eight) hours as  needed for nausea or vomiting.   Yes Historical Provider, MD  oxyCODONE-acetaminophen (PERCOCET/ROXICET) 5-325 MG per tablet Take 1 tablet by mouth every 6 (six) hours as needed for moderate pain. 04/05/15  Yes Vassie Loll, MD  pantoprazole (PROTONIX) 20 MG tablet Take 40 mg by mouth daily.   Yes Historical Provider, MD  rosuvastatin (CRESTOR) 20 MG tablet Take 1 tablet (20 mg total) by mouth daily. 08/02/14  Yes Kathlen Mody, MD  saccharomyces boulardii (FLORASTOR) 250 MG capsule Take 1 capsule (250 mg total) by mouth 2 (two) times daily. 04/05/15  Yes Vassie Loll, MD  thiamine (VITAMIN B-1) 100 MG tablet Take 1 tablet (100 mg total) by mouth daily. 08/02/14  Yes Kathlen Mody, MD  traZODone (DESYREL) 25 mg TABS tablet Take 0.5 tablets (25 mg total) by mouth at bedtime as needed for sleep. 08/02/14  Yes Kathlen Mody, MD  venlafaxine (EFFEXOR) 25 MG tablet Take 1 tablet (25 mg total) by mouth 2 (two) times daily. 03/04/15  Yes Alison Murray, MD  apixaban (ELIQUIS) 2.5 MG TABS tablet Take 1 tablet (2.5 mg total) by mouth 2 (two) times daily. Patient not taking: Reported on 01/09/2016 04/05/15   Vassie Loll, MD  cephALEXin (KEFLEX) 500 MG capsule Take 1 capsule (500 mg total) by mouth 2 (two) times daily. Patient not taking: Reported on 01/29/2016 01/09/16   Derwood Kaplan, MD  lactulose (CHRONULAC) 10 GM/15ML solution Take 15 mLs (10 g total) by mouth daily as needed for moderate constipation. Patient not taking: Reported on 01/09/2016 04/05/15   Vassie Loll, MD  metoprolol tartrate (LOPRESSOR) 25 MG tablet Take 0.5 tablets (12.5 mg total) by mouth 2 (two) times daily. Patient not taking: Reported on 01/09/2016 04/05/15   Vassie Loll, MD   BP 133/78 mmHg  Pulse 77  Temp(Src) 98.1 F (36.7 C) (Oral)  Resp 18  Ht 5\' 5"  (1.651 m)  Wt 38.102 kg  BMI 13.98 kg/m2  SpO2 92%  LMP 10/01/2010 Physical Exam  Constitutional: She appears distressed.  Appears chronically ill and older than her age  HENT:  Head:  Normocephalic and atraumatic.  Right Ear: External ear normal.  Left Ear: External ear normal.  Eyes: Conjunctivae are normal. Right eye exhibits no discharge. Left eye exhibits no discharge. No scleral icterus.  Neck: Neck supple. No tracheal deviation present.  Cardiovascular: Normal rate, regular rhythm and intact distal pulses.   Pulmonary/Chest: Effort normal and breath sounds normal. No stridor. No respiratory distress. She has no wheezes. She has no rales.  Abdominal: Soft. Bowel sounds are normal. She exhibits no distension. There is no tenderness. There is no rebound and no guarding.  Musculoskeletal: She exhibits edema and tenderness.  Erythema and edema  of the foot ankle and thigh on left leg, , no increased warmth, very tender to very  light palpation,   Neurological: She is alert. She has normal strength. No cranial nerve deficit (no facial droop, extraocular movements intact, no slurred speech) or sensory deficit. She exhibits normal muscle tone. She displays no seizure activity. Coordination normal.  Skin: Skin is warm and dry. No rash noted.  Psychiatric: She has a normal mood and affect.  Nursing note and vitals reviewed.   ED Course  Procedures (including critical care time) Labs Review Labs Reviewed  CBC WITH DIFFERENTIAL/PLATELET - Abnormal; Notable for the following:    WBC 18.3 (*)    RBC 3.50 (*)    Hemoglobin 11.5 (*)    HCT 34.7 (*)    Platelets 121 (*)    Neutro Abs 15.6 (*)    All other components within normal limits  PROTIME-INR - Abnormal; Notable for the following:    Prothrombin Time 16.0 (*)    All other components within normal limits  COMPREHENSIVE METABOLIC PANEL - Abnormal; Notable for the following:    Sodium 132 (*)    Chloride 95 (*)    CO2 18 (*)    Glucose, Bld 125 (*)    Creatinine, Ser 1.34 (*)    Calcium 7.7 (*)    Total Protein 5.9 (*)    Albumin 2.1 (*)    AST 478 (*)    ALT 73 (*)    Alkaline Phosphatase 258 (*)    Total  Bilirubin 1.3 (*)    GFR calc non Af Amer 43 (*)    GFR calc Af Amer 50 (*)    Anion gap 19 (*)    All other components within normal limits   VASCULAR LAB PRELIMINARY PRELIMINARY PRELIMINARY PRELIMINARY  Bilateral lower extremity venous duplex completed.   Preliminary report: There is acute, occlusive DVT noted throughout the left lower extremity including the posterior tibial, peroneal, popliteal, femoral, and common femoral veins.  Reported results to Dr. Linwood Dibbles  [facility], [facility], RVT 01/29/2016, 2:28 PM  I have personally reviewed and evaluated these images and lab results as part of my medical decision-making.    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  She has a large clot involving the vessels listed above.  Pt had been on anticoagulants in the past but developed gi bleeding.  I will consult vascular surgery to see if she is a candidate for any type of thrombectomy.  Will start heparin.   Discussed with Dr Imogene Burn.  Pt does have higher risk and could be a candidate for IR considering the extent.  Will start on IV heparin.  Consider her higher risk for PE will consult with medicine for observation.    Linwood Dibbles, MD 01/29/16 [phone]

## 2016-01-30 ENCOUNTER — Inpatient Hospital Stay (HOSPITAL_COMMUNITY): Payer: Medicare Other

## 2016-01-30 DIAGNOSIS — R0600 Dyspnea, unspecified: Secondary | ICD-10-CM

## 2016-01-30 LAB — CBC
HCT: 26.7 % — ABNORMAL LOW (ref 36.0–46.0)
Hemoglobin: 8.8 g/dL — ABNORMAL LOW (ref 12.0–15.0)
MCH: 32.4 pg (ref 26.0–34.0)
MCHC: 33 g/dL (ref 30.0–36.0)
MCV: 98.2 fL (ref 78.0–100.0)
PLATELETS: 116 10*3/uL — AB (ref 150–400)
RBC: 2.72 MIL/uL — AB (ref 3.87–5.11)
RDW: 15.4 % (ref 11.5–15.5)
WBC: 15 10*3/uL — ABNORMAL HIGH (ref 4.0–10.5)

## 2016-01-30 LAB — ECHOCARDIOGRAM COMPLETE
HEIGHTINCHES: 65 in
WEIGHTICAEL: 1629.64 [oz_av]

## 2016-01-30 LAB — COMPREHENSIVE METABOLIC PANEL
ALK PHOS: 184 U/L — AB (ref 38–126)
ALT: 69 U/L — AB (ref 14–54)
ANION GAP: 10 (ref 5–15)
AST: 410 U/L — ABNORMAL HIGH (ref 15–41)
Albumin: 1.6 g/dL — ABNORMAL LOW (ref 3.5–5.0)
BUN: 14 mg/dL (ref 6–20)
CALCIUM: 6.9 mg/dL — AB (ref 8.9–10.3)
CHLORIDE: 103 mmol/L (ref 101–111)
CO2: 21 mmol/L — AB (ref 22–32)
CREATININE: 1.12 mg/dL — AB (ref 0.44–1.00)
GFR, EST NON AFRICAN AMERICAN: 53 mL/min — AB (ref >60)
Glucose, Bld: 100 mg/dL — ABNORMAL HIGH (ref 65–99)
Potassium: 3.9 mmol/L (ref 3.5–5.1)
SODIUM: 134 mmol/L — AB (ref 135–145)
Total Bilirubin: 1 mg/dL (ref 0.3–1.2)
Total Protein: 4.7 g/dL — ABNORMAL LOW (ref 6.5–8.1)

## 2016-01-30 LAB — HEPARIN LEVEL (UNFRACTIONATED)
HEPARIN UNFRACTIONATED: 0.82 [IU]/mL — AB (ref 0.30–0.70)
HEPARIN UNFRACTIONATED: 0.19 [IU]/mL — AB (ref 0.30–0.70)

## 2016-01-30 MED ORDER — HEPARIN (PORCINE) IN NACL 100-0.45 UNIT/ML-% IJ SOLN: 700.0000 [IU]/h | INTRAMUSCULAR | Status: DC

## 2016-01-30 MED ORDER — HEPARIN BOLUS VIA INFUSION
1500.0000 [IU] | Freq: Once | INTRAVENOUS | Status: AC
  Administered 2016-01-30: 1500 [IU] via INTRAVENOUS
  Filled 2016-01-30: qty 1500

## 2016-01-30 MED ORDER — PERFLUTREN LIPID MICROSPHERE
INTRAVENOUS | Status: AC
  Filled 2016-01-30: qty 10

## 2016-01-30 MED ORDER — PREDNISONE 20 MG PO TABS
50.0000 mg | ORAL_TABLET | ORAL | Status: AC
  Administered 2016-01-30 – 2016-01-31 (×3): 50 mg via ORAL
  Filled 2016-01-30 (×3): qty 2

## 2016-01-30 MED ORDER — HEPARIN (PORCINE) IN NACL 100-0.45 UNIT/ML-% IJ SOLN
700.0000 [IU]/h | INTRAMUSCULAR | Status: DC
  Administered 2016-01-30: 550 [IU]/h via INTRAVENOUS
  Administered 2016-01-30: 650 [IU]/h via INTRAVENOUS
  Filled 2016-01-30 (×5): qty 250

## 2016-01-30 MED ORDER — CALCIUM CARBONATE ANTACID 500 MG PO CHEW
1.0000 | CHEWABLE_TABLET | Freq: Two times a day (BID) | ORAL | Status: DC
  Administered 2016-01-30 – 2016-02-08 (×18): 200 mg via ORAL
  Filled 2016-01-30 (×20): qty 1

## 2016-01-30 MED ORDER — PERFLUTREN LIPID MICROSPHERE
1.0000 mL | INTRAVENOUS | Status: AC | PRN
  Administered 2016-01-30: 2 mL via INTRAVENOUS
  Filled 2016-01-30: qty 10

## 2016-01-30 MED ORDER — DIPHENHYDRAMINE HCL 25 MG PO CAPS
50.0000 mg | ORAL_CAPSULE | Freq: Once | ORAL | Status: AC
  Administered 2016-01-31: 50 mg via ORAL
  Filled 2016-01-30: qty 2

## 2016-01-30 NOTE — Progress Notes (Addendum)
Patient ID: [name], female   DOB: 1960-12-13, 55 y.o.   MRN: [id number] Request received for LLE venography, thrombolysis on pt with hx acute extensive LLE DVT who presented to [facility] ED this weekend. Pt states she woke up 4/27 am with swollen LLE which has persisted and now painful with intermittent foot paresthesias. LLE venous doppler done 4/30 revealed acute, occlusive DVT noted throughout the left lower extremity including the posterior tibial, peroneal, popliteal, femoral, and common femoral veins. PMH also sig for DM, HTN, ?Chron's dz although most recent endo/colonoscopy neg,hypothyroidism, HLD,  PE 2015, GERD, pancreatitis. Prior PE treated with eliquis; currently on IV heparin. Pt also has ? contrast allergy with"burning" sensation but no discrete rash/dyspnea or itching. Latest clinical data reviewed by Dr. Loreta Ave and case d/w Dr. Vanessa Barbara. Pt currently denies hx cancer, prior DVT, recent bleeding/surgery (reported heme positive stools in 03/2015 while on eliquis - neg scopes). Details/risks of thrombolysis briefly discussed with pt today. Plan is for transfer to [facility] today for probable initiaition of thrombolytic therapy on 5/2. Check am labs. Pt will be premedicated with prednisone/benadryl prep to diminish any potential contrast reaction during case. Will plan to re - evaluate pt in am and obtain consent if thrombolysis performed.

## 2016-01-30 NOTE — Progress Notes (Deleted)
ANTICOAGULATION CONSULT NOTE   Pharmacy Consult for Heparin Indication: DVT  Allergies  Allergen Reactions  . Iohexol Other (See Comments)    "severe burning" Patient has received Contrast in 2005 with 13 hour pre-medication, and had no reaction at that time  . Spiriva Handihaler [Tiotropium Bromide Monohydrate] Nausea And Vomiting  . Ativan [Lorazepam] Other (See Comments)    hallucinations  . Penicillins Hives   Patient Measurements: Height: 5\' 5"  (165.1 cm) Weight: 101 lb 13.6 oz (46.2 kg) IBW/kg (Calculated) : 57 Heparin Dosing Weight: actual weight  Vital Signs: Temp: 98.1 F (36.7 C) (05/01 0612) Temp Source: Oral (05/01 0612) BP: 100/79 mmHg (05/01 0612) Pulse Rate: 86 (05/01 0612)  Labs:  Recent Labs  01/29/16 1252 01/29/16 2331 01/30/16 0429 01/30/16 0751  HGB 11.5*  --  8.8*  --   HCT 34.7*  --  26.7*  --   PLT 121*  --  116*  --   LABPROT 16.0*  --   --   --   INR 1.27  --   --   --   HEPARINUNFRC <0.10* 0.97*  --  0.82*  CREATININE 1.34*  --  1.12*  --    Estimated Creatinine Clearance: 40.4 mL/min (by C-G formula based on Cr of 1.12).  Medical History: Past Medical History  Diagnosis Date  . Glaucoma   . Diabetes mellitus without complication (HCC)   . Hypertension   . Neuropathy (HCC)   . Crohn disease (HCC)   . Asthma   . Pulmonary embolus (HCC) 05/24/2014  . Hypothyroidism   . GERD (gastroesophageal reflux disease)   . HLD (hyperlipidemia)   . C. difficile colitis   . Pancreatitis   . Asthma   . Tobacco abuse    Medications:  Scheduled:  . gabapentin  100 mg Oral BID  . levothyroxine  25 mcg Oral QAC breakfast  . pneumococcal 23 valent vaccine  0.5 mL Intramuscular Tomorrow-1000  . sodium chloride flush  3 mL Intravenous Q12H  . venlafaxine  25 mg Oral BID   Infusions:  . sodium chloride 75 mL/hr at 01/29/16 2307  . heparin 700 Units/hr (01/30/16 0031)   Assessment: 55 yo female presents with (+)LLE DVT - symptoms concerning  for developing phlegmasia cerulea dolens.  Pharmacy is consulted to dose IV heparin. Vascular surgery is consulted to evaluate if patient is a candidate for thrombectomy.  Attempted VQ scan, patient unable to complete.  Pt has a history of PE that was treated with Eliquis.  Patient no longer on Eliquis PTA.  Today, 01/30/2016  Heparin level remains supratherapeutic at 0.82 despite decreasing infusion rate to 700 units/hr.    Hgb decreased to 8.8, plts decreased slightly to 116K.  RN does not report any bleeding.  Pt reports clear urine and has not had any bowel movements.  Possibly dilutional as patient has been receiving MIVF at high rate.  Goal of Therapy:  Heparin level 0.3-0.7 units/ml Monitor platelets by anticoagulation protocol: Yes   Plan:   Reduce Heparin to 550 units/hr  Recheck heparin level in 8hrs  Daily heparin level and CBC  Clance Boll, PharmD, BCPS Pager: [phone] 01/30/2016 8:50 AM

## 2016-01-30 NOTE — Progress Notes (Addendum)
ANTICOAGULATION CONSULT NOTE   Pharmacy Consult for Heparin Indication: DVT  Allergies  Allergen Reactions  . Iohexol Other (See Comments)    "severe burning" Patient has received Contrast in 2005 with 13 hour pre-medication, and had no reaction at that time  . Spiriva Handihaler [Tiotropium Bromide Monohydrate] Nausea And Vomiting  . Ativan [Lorazepam] Other (See Comments)    hallucinations  . Penicillins Hives   Patient Measurements: Height: 5\' 5"  (165.1 cm) Weight: 101 lb 13.6 oz (46.2 kg) IBW/kg (Calculated) : 57 Heparin Dosing Weight: actual weight  Vital Signs: Temp: 98.1 F (36.7 C) (05/01 0612) Temp Source: Oral (05/01 0612) BP: 100/79 mmHg (05/01 0612) Pulse Rate: 86 (05/01 0612)  Labs:  Recent Labs  01/29/16 1252 01/29/16 2331 01/30/16 0429 01/30/16 0751  HGB 11.5*  --  8.8*  --   HCT 34.7*  --  26.7*  --   PLT 121*  --  116*  --   LABPROT 16.0*  --   --   --   INR 1.27  --   --   --   HEPARINUNFRC <0.10* 0.97*  --  0.82*  CREATININE 1.34*  --  1.12*  --    Estimated Creatinine Clearance: 40.4 mL/min (by C-G formula based on Cr of 1.12).  Medical History: Past Medical History  Diagnosis Date  . Glaucoma   . Diabetes mellitus without complication (HCC)   . Hypertension   . Neuropathy (HCC)   . Crohn disease (HCC)   . Asthma   . Pulmonary embolus (HCC) 05/24/2014  . Hypothyroidism   . GERD (gastroesophageal reflux disease)   . HLD (hyperlipidemia)   . C. difficile colitis   . Pancreatitis   . Asthma   . Tobacco abuse    Medications:  Scheduled:  . gabapentin  100 mg Oral BID  . levothyroxine  25 mcg Oral QAC breakfast  . pneumococcal 23 valent vaccine  0.5 mL Intramuscular Tomorrow-1000  . sodium chloride flush  3 mL Intravenous Q12H  . venlafaxine  25 mg Oral BID   Infusions:  . sodium chloride 75 mL/hr at 01/29/16 2307  . 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  Pharmacy is consulted to dose IV heparin. Vascular surgery is consulted to evaluate if patient is a candidate for thrombectomy.  Attempted VQ scan, patient unable to complete.  Pt has a history of PE that was treated with Eliquis.  Patient no longer on Eliquis PTA, stopped in 2016.  Pt also has a history of GIB.  Today, 01/30/2016  Heparin level remains supratherapeutic at 0.82 despite decreasing infusion rate to 700 units/hr.    Hgb decreased to 8.8, plts decreased slightly to 116K.  RN does not report any bleeding.  Pt reports clear urine and has not had any bowel movements.  TRH has also examined the patient this morning and does not find any evidence of bleed.  Possibly dilutional as patient has been receiving MIVF at high rate?  Goal of Therapy:  Heparin level 0.3-0.7 units/ml Monitor platelets by anticoagulation protocol: Yes   Plan:   Reduce Heparin to 550 units/hr  Recheck heparin level in 8hrs  Daily heparin level and CBC  Clance Boll, PharmD, BCPS Pager: [phone] 01/30/2016 9:05 AM

## 2016-01-30 NOTE — Progress Notes (Signed)
Per IR at [facility], patient's procedure to be performed tomorrow AM likely. Transfer order in place and awaiting bed placement.

## 2016-01-30 NOTE — Progress Notes (Signed)
ANTICOAGULATION CONSULT NOTE   Pharmacy Consult for Heparin Indication: DVT  Allergies  Allergen Reactions  . Iohexol Other (See Comments)    "severe burning" Patient has received Contrast in 2005 with 13 hour pre-medication, and had no reaction at that time  . Spiriva Handihaler [Tiotropium Bromide Monohydrate] Nausea And Vomiting  . Ativan [Lorazepam] Other (See Comments)    hallucinations  . Penicillins Hives   Patient Measurements: Height: 5\' 5"  (165.1 cm) Weight: 110 lb 12.8 oz (50.259 kg) IBW/kg (Calculated) : 57 Heparin Dosing Weight: actual weight  Vital Signs: Temp: 98.5 F (36.9 C) (05/01 1814) Temp Source: Oral (05/01 1814) BP: 108/74 mmHg (05/01 1814) Pulse Rate: 93 (05/01 1814)  Labs:  Recent Labs  01/29/16 1252 01/29/16 2331 01/30/16 0429 01/30/16 0751 01/30/16 1922  HGB 11.5*  --  8.8*  --   --   HCT 34.7*  --  26.7*  --   --   PLT 121*  --  116*  --   --   LABPROT 16.0*  --   --   --   --   INR 1.27  --   --   --   --   HEPARINUNFRC <0.10* 0.97*  --  0.82* 0.19*  CREATININE 1.34*  --  1.12*  --   --    Estimated Creatinine Clearance: 44 mL/min (by C-G formula based on Cr of 1.12).  Medications:  Infusions:  . sodium chloride 75 mL/hr at 01/30/16 1408  . 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  Pharmacy is consulted to dose IV heparin. Vascular surgery is consulted to evaluate if patient is a candidate for thrombectomy.  Attempted VQ scan, patient unable to complete.  Pt has a history of PE that was treated with Eliquis.  Patient no longer on Eliquis PTA, stopped in 2016.  Pt also has a history of GIB.  HL is now SUBtherapeutic at 0.19 on 550 units/hr. No bleeding noted. Spoke with RN and no problems infusing. Previously SUPRAtherapeutic on 700 units/hr with HL 0.82.  Goal of Therapy:  Heparin level 0.3-0.7 units/ml Monitor  platelets by anticoagulation protocol: Yes   Plan:  Heparin 1500 units IV bolus then increase rate to 650 units/hr 8 hr HL Daily HL and CBC Monitor for s/sx of bleeding  [facility], [place].D., BCPS Clinical Pharmacist Pager: [phone] 01/30/2016 7:50 PM

## 2016-01-30 NOTE — Progress Notes (Signed)
ANTICOAGULATION CONSULT NOTE   Pharmacy Consult for Heparin Indication: DVT  Allergies  Allergen Reactions  . Iohexol Other (See Comments)    "severe burning" Patient has received Contrast in 2005 with 13 hour pre-medication, and had no reaction at that time  . Spiriva Handihaler [Tiotropium Bromide Monohydrate] Nausea And Vomiting  . Ativan [Lorazepam] Other (See Comments)    hallucinations  . Penicillins Hives   Patient Measurements: Height: 5\' 5"  (165.1 cm) Weight: 101 lb 13.6 oz (46.2 kg) IBW/kg (Calculated) : 57 Heparin Dosing Weight: actual weight  Vital Signs: Temp: 99.1 F (37.3 C) (04/30 2307) Temp Source: Axillary (04/30 2307) BP: 110/84 mmHg (04/30 2307) Pulse Rate: 108 (04/30 2307)  Labs:  Recent Labs  01/29/16 1252 01/29/16 2331  HGB 11.5*  --   HCT 34.7*  --   PLT 121*  --   LABPROT 16.0*  --   INR 1.27  --   HEPARINUNFRC <0.10* 0.97*  CREATININE 1.34*  --    Estimated Creatinine Clearance: 33.8 mL/min (by C-G formula based on Cr of 1.34).  Medical History: Past Medical History  Diagnosis Date  . Glaucoma   . Diabetes mellitus without complication (HCC)   . Hypertension   . Neuropathy (HCC)   . Crohn disease (HCC)   . Asthma   . Pulmonary embolus (HCC) 05/24/2014  . Hypothyroidism   . GERD (gastroesophageal reflux disease)   . HLD (hyperlipidemia)   . C. difficile colitis   . Pancreatitis   . Asthma   . Tobacco abuse    Medications:  Scheduled:  . gabapentin  100 mg Oral BID  . levothyroxine  25 mcg Oral QAC breakfast  . pneumococcal 23 valent vaccine  0.5 mL Intramuscular Tomorrow-1000  . sodium chloride flush  3 mL Intravenous Q12H  . venlafaxine  25 mg Oral BID   Infusions:  . sodium chloride 75 mL/hr at 01/29/16 2307  . 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  Pharmacy is consulted to dose IV heparin. Vascular surgery is consulted to  evaluate if patient is a candidate for thrombectomy.  Today, 01/30/2016  Heparin bolus 1500 units, infusion at 900 units/hr begun 1530, Heparin infusion order d/c at 1730, but Heparin infusion never stopped.  Attempted VQ scan, patient unable to complete  First Hep level 0.97 units/ml at 2330, above desired range  Goal of Therapy:  Heparin level 0.3-0.7 units/ml Monitor platelets by anticoagulation protocol: Yes   Plan:   Reduce Heparin to 700 units/hr  recheck heparin level in 8hrs  Daily heparin level and CBC  Otho Bellows PharmD Pager [phone] 01/30/2016, 3:36 AM

## 2016-01-30 NOTE — Progress Notes (Signed)
Report called to RN Gwynn at [facility] 3W room 21C.

## 2016-01-30 NOTE — Progress Notes (Signed)
Echocardiogram 2D Echocardiogram with Definity has been performed.  [name] 01/30/2016, 10:48 AM

## 2016-01-30 NOTE — Progress Notes (Signed)
PROGRESS NOTE    [name]  ZOX:[id number] DOB: 02/14/1961 DOA: 01/29/2016 PCP: No primary care provider on file.  Outpatient Specialists:  Brief Narrative: 55 year old female with multiple comorbidities including type 2 diabetes mellitus, hypothyroidism, admitted to medicine service on 01/29/2016 when she presented with complaints of extensive left lower extremity swelling, erythema, pain. She was diagnosed with deep venous thrombosis including posterior tibial, peroneal, popliteal, femoral and common femoral veins. She was started on IV heparin. Case was discussed with Dr. Bonnielee Haff of interventional radiology who agreed that she would be candidate for thrombectomy. On 01/29/2006 spoke with interventional radiology who recommended transfer to [facility] to undergo procedure.   Assessment & Plan:   Principal Problem:   DVT (deep venous thrombosis), unspecified laterality Active Problems:   DM2 (diabetes mellitus, type 2) (HCC)   HTN (hypertension)   Normocytic anemia   History of pulmonary embolus (PE)   Severe protein-calorie malnutrition (HCC)  1.  Extensive left lower extremity DVT. -[name] presented with extensive swelling involving the left lower extremity as ultrasound performed in the emergency room showed DVT affecting posterior tibial, peroneal, popliteal, femoral and bilateral common femoral veins. -I spoke with Dr. Bonnielee Haff of interventional radiology agrees she may be candidate for thrombectomy. Meanwhile she was started on anticoagulation with IV heparin. -On 01/30/2016 spoke with interventional radiology who recommended she be transferred to [facility] where she will undergo procedure. They recommended [facility].  -She'll be transferred to telemetry.  2.  Shortness of breath.  -On admission she reported having some shortness of breath and was tachycardic in the emergency department as I was concerned for the possibility of a PE. -Initially  ordered a CT PE study however was informed of her contrast allergy. Ordered a VQ scan however she was unable to tolerate procedure. -Transthoracic echocardiogram was done today looking for evidence of right heart strain, pending results -He remains fully anticoagulated with IV heparin  3. History of GI bleed. -He had been hospitalized in June 2016 for GI bleed while on Eliquis tx.  - A.m. lab work showing a drop in her hemoglobin to 8.8 from 11.5 on presentation. -I examined her in detail, cannot find evidence of bleed, nursing staff not reporting bloody stools. -She remains anticoagulated with IV heparin  4.  Elevated transaminases. -Presented with elevated AST, ALT and alkaline phosphatase. She has a history elevated liver enzymes. -She had a recent CT of abdomen performed on 01/09/2016 that revealed hepatic steatosis. -Statin therapy was held on admission.  5.  Hypertension -Blood pressure soft, will continue holding antihypertensive agents  DVT prophylaxis: He is fully anticoagulated with IV heparin Code Status: Full code Family Communication:  Disposition Plan: He be transferred to [facility] to undergo thrombectomy   Consultants:   Interventional radiology   Procedures:     Subjective: She reports ongoing left lower extremity pain. Unchanged since yesterday. She denies bloody stools, hematemesis, melena.  Objective: Filed Vitals:   01/29/16 1718 01/29/16 1725 01/29/16 2307 01/30/16 0612  BP:  95/48 110/84 100/79  Pulse:  126 108 86  Temp:  97.9 F (36.6 C) 99.1 F (37.3 C) 98.1 F (36.7 C)  TempSrc:  Oral Axillary Oral  Resp:  18 16 16   Height: 5\' 5"  (1.651 m)     Weight: 46.2 kg (101 lb 13.6 oz)     SpO2:   100% 100%    Intake/Output Summary (Last 24 hours) at 01/30/16 1119 Last data filed  at 01/30/16 0700  Gross per 24 hour  Intake 1646.73 ml  Output      0 ml  Net 1646.73 ml   Filed Weights   01/29/16 1221 01/29/16 1718  Weight: 38.102 kg  (84 lb) 46.2 kg (101 lb 13.6 oz)    Examination:  General exam: Appears calm and comfortable  Respiratory system: Clear to auscultation. Respiratory effort normal. Cardiovascular system: S1 & S2 heard, RRR. No JVD, murmurs, rubs, gallops or clicks. No pedal edema. Gastrointestinal system: Abdomen is nondistended, soft and nontender. No organomegaly or masses felt. Normal bowel sounds heard. Central nervous system: Alert and oriented. No focal neurological deficits. Extremities: There is extensive swelling erythema with significant pain to palpation extending from foot up to proximal thigh Skin: No rashes, lesions or ulcers Psychiatry: Judgement and insight appear normal. Mood & affect appropriate.     Data Reviewed: I have personally reviewed following labs and imaging studies  CBC:  Recent Labs Lab 01/29/16 1252 01/30/16 0429  WBC 18.3* 15.0*  NEUTROABS 15.6*  --   HGB 11.5* 8.8*  HCT 34.7* 26.7*  MCV 99.1 98.2  PLT 121* 116*   Basic Metabolic Panel:  Recent Labs Lab 01/29/16 1252 01/30/16 0429  NA 132* 134*  K 4.5 3.9  CL 95* 103  CO2 18* 21*  GLUCOSE 125* 100*  BUN 13 14  CREATININE 1.34* 1.12*  CALCIUM 7.7* 6.9*   GFR: Estimated Creatinine Clearance: 40.4 mL/min (by C-G formula based on Cr of 1.12). Liver Function Tests:  Recent Labs Lab 01/29/16 1252 01/30/16 0429  AST 478* 410*  ALT 73* 69*  ALKPHOS 258* 184*  BILITOT 1.3* 1.0  PROT 5.9* 4.7*  ALBUMIN 2.1* 1.6*   No results for input(s): LIPASE, AMYLASE in the last 168 hours. No results for input(s): AMMONIA in the last 168 hours. Coagulation Profile:  Recent Labs Lab 01/29/16 1252  INR 1.27   Cardiac Enzymes: No results for input(s): CKTOTAL, CKMB, CKMBINDEX, TROPONINI in the last 168 hours. BNP (last 3 results) No results for input(s): PROBNP in the last 8760 hours. HbA1C: No results for input(s): HGBA1C in the last 72 hours. CBG: No results for input(s): GLUCAP in the last 168  hours. Lipid Profile: No results for input(s): CHOL, HDL, LDLCALC, TRIG, CHOLHDL, LDLDIRECT in the last 72 hours. Thyroid Function Tests: No results for input(s): TSH, T4TOTAL, FREET4, T3FREE, THYROIDAB in the last 72 hours. Anemia Panel: No results for input(s): VITAMINB12, FOLATE, FERRITIN, TIBC, IRON, RETICCTPCT in the last 72 hours. Urine analysis:    Component Value Date/Time   COLORURINE AMBER* 01/09/2016 1404   APPEARANCEUR CLOUDY* 01/09/2016 1404   LABSPEC 1.030 01/09/2016 1404   PHURINE 5.5 01/09/2016 1404   GLUCOSEU NEGATIVE 01/09/2016 1404   HGBUR SMALL* 01/09/2016 1404   BILIRUBINUR SMALL* 01/09/2016 1404   KETONESUR NEGATIVE 01/09/2016 1404   PROTEINUR 30* 01/09/2016 1404   UROBILINOGEN 1.0 02/28/2015 1227   NITRITE NEGATIVE 01/09/2016 1404   LEUKOCYTESUR MODERATE* 01/09/2016 1404   Sepsis Labs: @LABRCNTIP (procalcitonin:4,lacticidven:4)  )No results found for this or any previous visit (from the past 240 hour(s)).       Radiology Studies: Dg Chest Port 1 View  01/29/2016  CLINICAL DATA:  Shortness of breath EXAM: PORTABLE CHEST 1 VIEW COMPARISON:  CTA chest dated 03/31/2015 FINDINGS: Patient is rotated and her chin obscures the right lung apex. Lungs are clear.  No pleural effusion or pneumothorax. Heart is normal in size. IMPRESSION: No evidence of acute cardiopulmonary disease. Electronically Signed  By: Charline Bills M.D.   On: 01/29/2016 19:00        Scheduled Meds: . calcium carbonate  1 tablet Oral BID WC  . gabapentin  100 mg Oral BID  . levothyroxine  25 mcg Oral QAC breakfast  . pneumococcal 23 valent vaccine  0.5 mL Intramuscular Tomorrow-1000  . sodium chloride flush  3 mL Intravenous Q12H  . venlafaxine  25 mg Oral BID   Continuous Infusions: . sodium chloride 75 mL/hr at 01/29/16 2307  . heparin 550 Units/hr (01/30/16 0904)     LOS: 1 day    Time spent: 35 minutes   Jeralyn Bennett, MD [facility] Pager  [phone]  If 7PM-7AM, please contact night-coverage [email or web address] Password [email or web address] 01/30/2016, 11:19 AM

## 2016-01-31 ENCOUNTER — Encounter (HOSPITAL_COMMUNITY): Payer: Self-pay | Admitting: Radiology

## 2016-01-31 ENCOUNTER — Inpatient Hospital Stay (HOSPITAL_COMMUNITY): Payer: Medicare Other

## 2016-01-31 ENCOUNTER — Other Ambulatory Visit: Payer: Self-pay | Admitting: Radiology

## 2016-01-31 DIAGNOSIS — E872 Acidosis: Secondary | ICD-10-CM

## 2016-01-31 DIAGNOSIS — R600 Localized edema: Secondary | ICD-10-CM

## 2016-01-31 DIAGNOSIS — I82409 Acute embolism and thrombosis of unspecified deep veins of unspecified lower extremity: Secondary | ICD-10-CM

## 2016-01-31 DIAGNOSIS — D649 Anemia, unspecified: Secondary | ICD-10-CM

## 2016-01-31 LAB — CBC WITH DIFFERENTIAL/PLATELET
BASOS PCT: 0 %
Basophils Absolute: 0 10*3/uL (ref 0.0–0.1)
EOS ABS: 0 10*3/uL (ref 0.0–0.7)
Eosinophils Relative: 0 %
HCT: 26.1 % — ABNORMAL LOW (ref 36.0–46.0)
Hemoglobin: 8.5 g/dL — ABNORMAL LOW (ref 12.0–15.0)
LYMPHS ABS: 0.9 10*3/uL (ref 0.7–4.0)
LYMPHS PCT: 9 %
MCH: 31.6 pg (ref 26.0–34.0)
MCHC: 32.6 g/dL (ref 30.0–36.0)
MCV: 97 fL (ref 78.0–100.0)
Monocytes Absolute: 0.1 10*3/uL (ref 0.1–1.0)
Monocytes Relative: 1 %
NEUTROS ABS: 8.5 10*3/uL — AB (ref 1.7–7.7)
Neutrophils Relative %: 90 %
PLATELETS: 151 10*3/uL (ref 150–400)
RBC: 2.69 MIL/uL — AB (ref 3.87–5.11)
RDW: 15.9 % — ABNORMAL HIGH (ref 11.5–15.5)
WBC: 9.5 10*3/uL (ref 4.0–10.5)

## 2016-01-31 LAB — COMPREHENSIVE METABOLIC PANEL
ALBUMIN: 1.7 g/dL — AB (ref 3.5–5.0)
ALT: 60 U/L — AB (ref 14–54)
ANION GAP: 12 (ref 5–15)
AST: 172 U/L — ABNORMAL HIGH (ref 15–41)
Alkaline Phosphatase: 190 U/L — ABNORMAL HIGH (ref 38–126)
BUN: 11 mg/dL (ref 6–20)
CHLORIDE: 104 mmol/L (ref 101–111)
CO2: 16 mmol/L — AB (ref 22–32)
Calcium: 7.4 mg/dL — ABNORMAL LOW (ref 8.9–10.3)
Creatinine, Ser: 0.84 mg/dL (ref 0.44–1.00)
GFR calc non Af Amer: >60 mL/min (ref >60)
GLUCOSE: 107 mg/dL — AB (ref 65–99)
Potassium: 4.8 mmol/L (ref 3.5–5.1)
SODIUM: 132 mmol/L — AB (ref 135–145)
Total Bilirubin: 1 mg/dL (ref 0.3–1.2)
Total Protein: 5 g/dL — ABNORMAL LOW (ref 6.5–8.1)

## 2016-01-31 LAB — PROTIME-INR
INR: 1.31 (ref 0.00–1.49)
Prothrombin Time: 16.4 seconds — ABNORMAL HIGH (ref 11.6–15.2)

## 2016-01-31 LAB — HEPARIN LEVEL (UNFRACTIONATED)
Heparin Unfractionated: 0.56 IU/mL (ref 0.30–0.70)
Heparin Unfractionated: 0.42 IU/mL (ref 0.30–0.70)

## 2016-01-31 LAB — GLUCOSE, CAPILLARY
GLUCOSE-CAPILLARY: 115 mg/dL — AB (ref 65–99)
Glucose-Capillary: 119 mg/dL — ABNORMAL HIGH (ref 65–99)

## 2016-01-31 LAB — MRSA PCR SCREENING: MRSA by PCR: NEGATIVE

## 2016-01-31 LAB — CK: Total CK: 116 U/L (ref 38–234)

## 2016-01-31 MED ORDER — DIPHENHYDRAMINE HCL 50 MG PO CAPS
50.0000 mg | ORAL_CAPSULE | ORAL | Status: AC
  Administered 2016-02-01: 50 mg via ORAL
  Filled 2016-01-31: qty 1
  Filled 2016-01-31: qty 2

## 2016-01-31 MED ORDER — DIPHENHYDRAMINE HCL 50 MG/ML IJ SOLN
INTRAMUSCULAR | Status: AC
  Filled 2016-01-31: qty 1

## 2016-01-31 MED ORDER — LIDOCAINE HCL 1 % IJ SOLN
INTRAMUSCULAR | Status: AC
  Administered 2016-01-31: 10 mL
  Filled 2016-01-31: qty 20

## 2016-01-31 MED ORDER — SODIUM BICARBONATE 650 MG PO TABS
650.0000 mg | ORAL_TABLET | Freq: Two times a day (BID) | ORAL | Status: DC
  Administered 2016-01-31 – 2016-02-02 (×4): 650 mg via ORAL
  Filled 2016-01-31 (×4): qty 1

## 2016-01-31 MED ORDER — FENTANYL CITRATE (PF) 100 MCG/2ML IJ SOLN
INTRAMUSCULAR | Status: AC | PRN
  Administered 2016-01-31 (×2): 25 ug via INTRAVENOUS

## 2016-01-31 MED ORDER — PREDNISONE 1 MG PO TABS: 50.0000 mg | ORAL_TABLET | Freq: Four times a day (QID) | ORAL | Status: AC

## 2016-01-31 MED ORDER — SODIUM CHLORIDE 0.9 % IV SOLN
INTRAVENOUS | Status: AC
  Administered 2016-01-31: 50 mL/h via INTRAVENOUS
  Administered 2016-01-31: 17:00:00 via INTRAVENOUS

## 2016-01-31 MED ORDER — PREDNISONE 50 MG PO TABS
50.0000 mg | ORAL_TABLET | Freq: Four times a day (QID) | ORAL | Status: AC
  Administered 2016-02-01 (×3): 50 mg via ORAL
  Filled 2016-01-31 (×3): qty 1

## 2016-01-31 MED ORDER — DIPHENHYDRAMINE HCL 50 MG/ML IJ SOLN
INTRAMUSCULAR | Status: AC | PRN
  Administered 2016-01-31: 25 mg via INTRAVENOUS

## 2016-01-31 MED ORDER — TENECTEPLASE 50 MG IV KIT
0.4000 mg/h | PACK | INTRAVENOUS | Status: DC
  Administered 2016-01-31 – 2016-02-01 (×2): 0.4 mg/h via INTRAVENOUS
  Filled 2016-01-31 (×2): qty 1

## 2016-01-31 MED ORDER — DIPHENHYDRAMINE HCL 25 MG PO CAPS: 50.0000 mg | ORAL_CAPSULE | ORAL | Status: AC

## 2016-01-31 MED ORDER — MIDAZOLAM HCL 2 MG/2ML IJ SOLN
INTRAMUSCULAR | Status: DC | PRN
  Administered 2016-01-31: 0.5 mg via INTRAVENOUS

## 2016-01-31 MED ORDER — SODIUM CHLORIDE 0.9 % IV SOLN
INTRAVENOUS | Status: AC | PRN
  Administered 2016-01-31: 30 mL/h via INTRAVENOUS

## 2016-01-31 MED ORDER — MIDAZOLAM HCL 2 MG/2ML IJ SOLN
INTRAMUSCULAR | Status: AC
  Filled 2016-01-31: qty 2

## 2016-01-31 MED ORDER — IOPAMIDOL (ISOVUE-300) INJECTION 61%
100.0000 mL | Freq: Once | INTRAVENOUS | Status: AC | PRN
  Administered 2016-01-31: 100 mL via INTRAVENOUS

## 2016-01-31 MED ORDER — FENTANYL CITRATE (PF) 100 MCG/2ML IJ SOLN
INTRAMUSCULAR | Status: AC
  Filled 2016-01-31: qty 2

## 2016-01-31 MED ORDER — LIDOCAINE HCL 1 % IJ SOLN
INTRAMUSCULAR | Status: AC
  Filled 2016-01-31: qty 20

## 2016-01-31 MED ORDER — METHYLPREDNISOLONE SODIUM SUCC 125 MG IJ SOLR
INTRAMUSCULAR | Status: AC
  Filled 2016-01-31: qty 2

## 2016-01-31 NOTE — Sedation Documentation (Signed)
Patient is resting comfortably. 

## 2016-01-31 NOTE — H&P (Signed)
Chief Complaint: Left leg pain and swelling  Referring Physician(s): Jeralyn Bennett  Supervising Physician: Richarda Overlie  Patient Status: In-pt   History of Present Illness: [name] is a 55 y.o. female  with hx acute extensive LLE DVT who presented to [facility] ED on Sunday.   She states she woke up 4/27 am with swollen left leg which has persisted and was painful.  LLE venous doppler done 4/30 revealed acute, occlusive DVT noted throughout the left lower extremity including the posterior tibial, peroneal, popliteal, femoral, and common femoral veins.    Prior PE treated with Eliquis.  She subsequently had a GI bleed per chart and Eliquis was discontinued. However, [name] states she never had any bleeding. She did have full GI workup which was negative.  She also reports a fall "a few months ago" but she could not remember when. She does report "they scanned my head and there was nothing there".  She is currently on IV heparin and she does report some improvement in her lower leg edema.  Pt also has ? contrast allergy with "burning" sensation which she states lasted 3 months, but no discrete rash/dyspnea or itching.   PMH also positive for DM, HTN, ? Crohn's disease although most recent endo/colonoscopy negative,hypothyroidism, HLD, PE 2015, GERD, pancreatitis.  [name] currently denies hx cancer, prior DVT.   She was transferred to [facility] for evaluation for possible venous thrombectomy/thrombolysis.  She has been premedicated with prednisone/benadryl prep to diminish any potential contrast reaction during case.  She is NPO.  Past Medical History  Diagnosis Date  . Glaucoma   . Diabetes mellitus without complication (HCC)   . Hypertension   . Neuropathy (HCC)   . Crohn disease (HCC)   . Asthma   . Pulmonary embolus (HCC) 05/24/2014  . Hypothyroidism   . GERD (gastroesophageal reflux disease)   . HLD (hyperlipidemia)   . C. difficile colitis   .  Pancreatitis   . Asthma   . Tobacco abuse     Past Surgical History  Procedure Laterality Date  . Cesarean section    . Esophagogastroduodenoscopy N/A 05/21/2014    Procedure: ESOPHAGOGASTRODUODENOSCOPY (EGD);  Surgeon: Petra Kuba, MD;  Location: [facility] ENDOSCOPY;  Service: Endoscopy;  Laterality: N/A;  . Esophagogastroduodenoscopy N/A 03/02/2015    Procedure: ESOPHAGOGASTRODUODENOSCOPY (EGD);  Surgeon: Bernette Redbird, MD;  Location: [facility] ENDOSCOPY;  Service: Endoscopy;  Laterality: N/A;  . Colonoscopy N/A 03/02/2015    Procedure: COLONOSCOPY;  Surgeon: Bernette Redbird, MD;  Location: [facility] ENDOSCOPY;  Service: Endoscopy;  Laterality: N/A;    Allergies: Iohexol; Spiriva handihaler; Ativan; and Penicillins  Medications: Prior to Admission medications   Medication Sig Start Date End Date Taking? Authorizing Provider  calcium carbonate (OS-CAL - DOSED IN MG OF ELEMENTAL CALCIUM) 1250 (500 CA) MG tablet Take 1 tablet (500 mg of elemental calcium total) by mouth daily with breakfast. 03/04/15  Yes Alison Murray, MD  feeding supplement, RESOURCE BREEZE, (RESOURCE BREEZE) LIQD Take 1 Container by mouth 3 (three) times daily between meals. 08/02/14  Yes Kathlen Mody, MD  folic acid (FOLVITE) 1 MG tablet Take 1 tablet (1 mg total) by mouth daily. 08/02/14  Yes Kathlen Mody, MD  gabapentin (NEURONTIN) 100 MG capsule Take 1 capsule (100 mg total) by mouth 2 (two) times daily. 04/05/15  Yes Vassie Loll, MD  HYDROcodone-acetaminophen (NORCO/VICODIN) 5-325 MG tablet Take 1 tablet by mouth every 6 (six) hours as needed. 01/09/16  Yes Derwood Kaplan, MD  HYDROmorphone (DILAUDID) 4 MG tablet Take 8 mg by mouth every 4 (four) hours as needed for severe pain.   Yes Historical Provider, MD  levothyroxine (SYNTHROID, LEVOTHROID) 25 MCG tablet Take 1 tablet (25 mcg total) by mouth daily before breakfast. 08/02/14  Yes Kathlen Mody, MD  meclizine (ANTIVERT) 25 MG tablet Take 25 mg by mouth 3 (three) times daily as needed for  dizziness.   Yes Historical Provider, MD  ondansetron (ZOFRAN ODT) 4 MG disintegrating tablet Take 1 tablet (4 mg total) by mouth every 8 (eight) hours as needed for nausea or vomiting. 01/09/16  Yes Ankit Nanavati, MD  ondansetron (ZOFRAN) 4 MG tablet Take 4 mg by mouth every 8 (eight) hours as needed for nausea or vomiting.   Yes Historical Provider, MD  oxyCODONE-acetaminophen (PERCOCET/ROXICET) 5-325 MG per tablet Take 1 tablet by mouth every 6 (six) hours as needed for moderate pain. 04/05/15  Yes Vassie Loll, MD  pantoprazole (PROTONIX) 20 MG tablet Take 40 mg by mouth daily.   Yes Historical Provider, MD  rosuvastatin (CRESTOR) 20 MG tablet Take 1 tablet (20 mg total) by mouth daily. 08/02/14  Yes Kathlen Mody, MD  saccharomyces boulardii (FLORASTOR) 250 MG capsule Take 1 capsule (250 mg total) by mouth 2 (two) times daily. 04/05/15  Yes Vassie Loll, MD  thiamine (VITAMIN B-1) 100 MG tablet Take 1 tablet (100 mg total) by mouth daily. 08/02/14  Yes Kathlen Mody, MD  traZODone (DESYREL) 25 mg TABS tablet Take 0.5 tablets (25 mg total) by mouth at bedtime as needed for sleep. 08/02/14  Yes Kathlen Mody, MD  venlafaxine (EFFEXOR) 25 MG tablet Take 1 tablet (25 mg total) by mouth 2 (two) times daily. 03/04/15  Yes Alison Murray, MD  apixaban (ELIQUIS) 2.5 MG TABS tablet Take 1 tablet (2.5 mg total) by mouth 2 (two) times daily. Patient not taking: Reported on 01/09/2016 04/05/15   Vassie Loll, MD  cephALEXin (KEFLEX) 500 MG capsule Take 1 capsule (500 mg total) by mouth 2 (two) times daily. Patient not taking: Reported on 01/29/2016 01/09/16   Derwood Kaplan, MD  lactulose (CHRONULAC) 10 GM/15ML solution Take 15 mLs (10 g total) by mouth daily as needed for moderate constipation. Patient not taking: Reported on 01/09/2016 04/05/15   Vassie Loll, MD  metoprolol tartrate (LOPRESSOR) 25 MG tablet Take 0.5 tablets (12.5 mg total) by mouth 2 (two) times daily. Patient not taking: Reported on 01/09/2016 04/05/15    Vassie Loll, MD     Family History  Problem Relation Age of Onset  . Breast cancer Mother   . Heart disease Mother 54    CABG  . Diabetes Mother   . Heart disease Father 23    CABG  . Diabetes Father     Social History   Social History  . Marital Status: Divorced    Spouse Name: N/A  . Number of Children: N/A  . Years of Education: N/A   Social History Main Topics  . Smoking status: Current Every Day Smoker -- 0.25 packs/day for 26 years    Types: Cigarettes  . Smokeless tobacco: Never Used  . Alcohol Use: No  . Drug Use: No  . Sexual Activity: Not Asked   Other Topics Concern  . None   Social History Narrative     Review of Systems: A 12 point ROS discussed  Review of Systems  Constitutional: Positive for activity change. Negative for fever, chills and appetite change.  HENT: Negative.   Respiratory: Negative  for cough and shortness of breath.   Cardiovascular: Positive for leg swelling. Negative for chest pain.  Gastrointestinal: Negative for nausea, vomiting and abdominal pain.  Genitourinary: Negative.   Musculoskeletal: Negative.   Skin: Negative.   Neurological: Negative.   Hematological: Negative.   Psychiatric/Behavioral: Negative.     Vital Signs: BP 96/78 mmHg  Pulse 82  Temp(Src) 98 F (36.7 C) (Oral)  Resp 18  Ht 5\' 5"  (1.651 m)  Wt 111 lb (50.349 kg)  BMI 18.47 kg/m2  SpO2 100%  LMP 10/01/2010  Physical Exam  Constitutional: She is oriented to person, place, and time. She appears well-developed and well-nourished.  HENT:  Head: Normocephalic and atraumatic.  Eyes: EOM are normal.  Neck: Normal range of motion. Neck supple.  Cardiovascular: Normal rate and regular rhythm.   Murmur heard. Pulmonary/Chest: Effort normal and breath sounds normal. No respiratory distress. She has no wheezes.  Abdominal: Soft. Bowel sounds are normal.  Musculoskeletal:  Left thigh with moderate edema. Left foot and lower edema has nearly resolved  since Sunday (per pt) Nurse reports foot looks better since yesterday. Thigh and lower leg very tender to palpation and patient was refusing to move her leg initially due to pain, then during visit with pt, she subconsciously moved her leg without any pain.  Neurological: She is alert and oriented to person, place, and time.  Psychiatric:  Someone difficult patient. Inaccurate historian.  Vitals reviewed.   Mallampati Score:  MD Evaluation Airway: WNL Heart: WNL Abdomen: WNL Chest/ Lungs: WNL ASA  Classification: 2 Mallampati/Airway Score: Two  Imaging: Ct Abdomen Pelvis Wo Contrast  01/09/2016  CLINICAL DATA:  Abdominal pain with nausea and vomiting for 2 days. History of Crohn's disease and pancreatitis EXAM: CT ABDOMEN AND PELVIS WITHOUT CONTRAST TECHNIQUE: Multidetector CT imaging of the abdomen and pelvis was performed following the standard protocol without IV contrast. COMPARISON:  Feb 28, 2015 FINDINGS: Lower chest: There is patchy atelectasis in the lateral left base region. There is a lesser degree of atelectatic change in the posterior and lateral right base regions. Lung bases otherwise are clear. There are multiple foci of coronary artery calcification. Hepatobiliary: There is hepatic steatosis. There is again noted a mass in the dome of the liver anteriorly measuring 2.2 x 1.4 cm. There is minimal calcification along the periphery of this lesion. No other focal lesion is identified on this noncontrast enhanced study. The gallbladder wall is not appreciably thickened. The common bile duct measures 14 mm, stable from prior studies. No biliary duct mass or calculus is evident. Pancreas: There is no pancreatic mass or inflammatory focus. Mild dilatation of the pancreatic duct is a stable finding. Spleen: No splenic lesions are evident. Adrenals/Urinary Tract: Adrenals appear unremarkable and stable bilaterally. There is no hydronephrosis on either side. There is a 4 mm cyst in the  periphery of the right kidney. There is a 2 mm calculus in the upper pole of the right kidney. There is a 2 mm calculus in the lower pole of the right kidney. There is a 2 mm calculus in the mid right kidney. On the left, there is a 1 mm calculus in the upper to midportion. No ureteral calculi are identified on either side. The urinary bladder is midline with wall thickness within normal limits. Stomach/Bowel: There is evidence of previous surgery in left pelvis with anastomosis patent. There is no bowel wall or mesenteric thickening. There is no bowel obstruction. No free air or portal venous air. The  terminal ileal region appears unremarkable on this study. Vascular/Lymphatic: There is atherosclerotic calcification throughout the aorta and common iliac arteries. There is also calcification in both external iliac and hypogastric arteries. No abdominal aortic aneurysm is seen. The major mesenteric vessels appear patent on this noncontrast enhanced study. There is no adenopathy in the abdomen or pelvis. Reproductive: Uterus is anteverted. There is no pelvic mass or pelvic fluid collection. Other: Appendix appears normal. No ascites or abscess is appreciable in the abdomen or pelvis. There is a minimal ventral hernia containing only fat. There is chronic scarring in the posterior perineal region, stable. Musculoskeletal: There is no blastic or lytic bone lesions. There is degenerative type change in the lumbar spine. There is no intramuscular or abdominal wall lesion. IMPRESSION: No bowel obstruction. No appreciable bowel wall thickening. Postoperative changes noted in the left lower pelvis. Appendix appears normal. There are small nonobstructing calculi in each kidney. No hydronephrosis or ureteral calculus on either side. Hepatic steatosis. Well-circumscribed in the dome of the liver, felt to be of benign etiology. This lesion now does show mild peripheral calcification. Question old hematoma. Areas of patchy  atelectasis in lung bases, primarily in the lateral left base. This area in the lateral left base potentially could represent the earliest changes of pneumonia. Coronary artery calcification noted. Stable chronic scarring in the posterior perineal region. Electronically Signed   By: Bretta Bang III M.D.   On: 01/09/2016 16:10   Dg Chest Port 1 View  01/29/2016  CLINICAL DATA:  Shortness of breath EXAM: PORTABLE CHEST 1 VIEW COMPARISON:  CTA chest dated 03/31/2015 FINDINGS: Patient is rotated and her chin obscures the right lung apex. Lungs are clear.  No pleural effusion or pneumothorax. Heart is normal in size. IMPRESSION: No evidence of acute cardiopulmonary disease. Electronically Signed   By: Charline Bills M.D.   On: 01/29/2016 19:00    Labs:  CBC:  Recent Labs  04/05/15 0448 01/09/16 1233 01/29/16 1252 01/30/16 0429  WBC 14.9* 12.1* 18.3* 15.0*  HGB 9.1* 11.4* 11.5* 8.8*  HCT 28.2* 34.0* 34.7* 26.7*  PLT 191 191 121* 116*    COAGS:  Recent Labs  03/01/15 0615 03/28/15 2334 03/29/15 0246 01/29/16 1252 01/31/16 0541  INR 1.34  --  1.47 1.27 1.31  APTT 30 36 40*  --   --     BMP:  Recent Labs  04/03/15 0600 01/09/16 1233 01/29/16 1252 01/30/16 0429  NA 144 143 132* 134*  K 4.3 3.2* 4.5 3.9  CL 118* 106 95* 103  CO2 19* 18* 18* 21*  GLUCOSE 77 81 125* 100*  BUN <5* 6 13 14   CALCIUM 7.1* 7.6* 7.7* 6.9*  CREATININE 0.46 0.89 1.34* 1.12*  GFRNONAA >60 >60 43* 53*  GFRAA >60 >60 50* >60    LIVER FUNCTION TESTS:  Recent Labs  04/03/15 0600 01/09/16 1233 01/29/16 1252 01/30/16 0429  BILITOT 0.4 1.1 1.3* 1.0  AST 27 43* 478* 410*  ALT 17 18 73* 69*  ALKPHOS 111 190* 258* 184*  PROT 3.5* 5.4* 5.9* 4.7*  ALBUMIN 1.3* 2.1* 2.1* 1.6*    TUMOR MARKERS: No results for input(s): AFPTM, CEA, CA199, CHROMGRNA in the last 8760 hours.  Assessment and Plan:  Extensive Left leg DVT.  Iliac veins not evaluated on doppler.  Left leg does have  improvement in edema already with Heparin drip alone.  Will obtain a CT venogram of the abdomen and pelvis to evaluate the iliac vein for thrombus.  She  has been pre-medicated for her contrast allergy.  Dr. Lowella Dandy and I both saw the patient and had a long discussion regarding options.  Risks and Benefits discussed with the patient including, but not limited to bleeding, possible life threatening bleeding and need for blood product transfusion, vascular injury, stroke, contrast induced renal failure, limb loss and infection.  All of the patient's questions were answered, patient is agreeable to proceed if the thrombus extends into the Iliac veins. Consent signed and in chart.  Thank you for this interesting consult.  I greatly enjoyed meeting [name] and look forward to participating in their care.  A copy of this report was sent to the requesting provider on this date.  Electronically Signed: Gwynneth Macleod PA-C 01/31/2016, 8:44 AM   I spent a total of 40 Minutes  in face to face in clinical consultation, greater than 50% of which was counseling/coordinating care for consideration of venous lysis/thrombectomy

## 2016-01-31 NOTE — Progress Notes (Signed)
PROGRESS NOTE    [name]  FXT:[id number] DOB: 27-Nov-1960 DOA: 01/29/2016 PCP: No primary care provider on file.  Outpatient Specialists:  Brief Narrative: 55 year old female with multiple comorbidities including type 2 diabetes mellitus, hypothyroidism, admitted to medicine service on 01/29/2016 when she presented with complaints of extensive left lower extremity swelling, erythema & pain. She was diagnosed with deep venous thrombosis including posterior tibial, peroneal, popliteal, femoral and common femoral veins. She was started on IV heparin. IR was consulted and recommended transferring patient from [facility] to [facility]. Plan is for thrombolysis 5/2. IR recommends ICU admission postprocedure for continued lytics. CCM consulted for same.  Assessment & Plan:   Principal Problem:   DVT (deep venous thrombosis), unspecified laterality Active Problems:   DM2 (diabetes mellitus, type 2) (HCC)   HTN (hypertension)   Normocytic anemia   History of pulmonary embolus (PE)   Severe protein-calorie malnutrition (HCC)  Extensive left lower extremity DVT. -[name] presented with extensive swelling involving the left lower extremity & ultrasound performed in the emergency room showed DVT affecting posterior tibial, peroneal, popliteal, femoral and bilateral common femoral veins. - Dr. Vanessa Barbara spoke with Dr. Bonnielee Haff of interventional radiology who indicated that she may be candidate for thrombectomy. Meanwhile she was started on anticoagulation with IV heparin. She was thereby transferred to [facility] for procedure.  Shortness of breath.  - On admission she reported having some shortness of breath and was tachycardic in the emergency department and Dr. Vanessa Barbara was concerned for the possibility of a PE. - Initially ordered a CT PE study however was informed of her contrast allergy. Ordered a VQ scan however she was unable to tolerate procedure. -Transthoracic echocardiogram was done today looking  for evidence of right heart strain: None seen. Detailed 2-D echo results as below. Dyspnea improved/resolved. - She remains fully anticoagulated with IV heparin  History of GI bleed. - She had been hospitalized in June 2016 for GI bleed while on Eliquis tx.  - 5/1 am lab work showed a drop in her hemoglobin to 8.8 from 11.5 on presentation. - No features of overt bleeding. - She remains anticoagulated with IV heparin - Hemoglobin has stayed stable over the last 24 hours. Follow CBC in a.m.  Elevated transaminases. - Presented with elevated AST, ALT and alkaline phosphatase. She has a history elevated liver enzymes. - She had a recent CT of abdomen performed on 01/09/2016 that revealed hepatic steatosis. - Statin therapy was held on admission. Improving. Check total CK. - Follow LFTs periodically.  Essential Hypertension - Blood pressure soft, will continue holding antihypertensive agents  Non-anion gap metabolic acidosis - She seems to have chronic mild low bicarbonate. Serum bicarbonate today 16. Anion gap 12. Unclear etiology. Follow BMP in a.m. Consider oral sodium bicarbonate tablets.  DVT prophylaxis: she is fully anticoagulated with IV heparin Code Status: Full code Family Communication: None at bedside Disposition Plan: Transferred from [facility] to telemetry at [facility] on 5/1. Post thrombectomy, transferring patient to ICU as per IR recommendations. DC home when stable.   Consultants:   Interventional radiology  Procedures:   PICC  Antimicrobials - None   Subjective: States that her left thigh swelling and pain have improved compared to yesterday. Denies chest pain or dyspnea. No bleeding reported. As per RN, no acute issues.  Objective: Filed Vitals:   01/30/16 1814 01/31/16 0000 01/31/16 0340 01/31/16 1349  BP: 108/74 90/64 96/78  108/72  Pulse: 93  82 81  Temp: 98.5 F (  36.9 C)  98 F (36.7 C)   TempSrc: Oral  Oral   Resp: 18   16  Height:   (1.651 m)     Weight: 50.259 kg (110 lb 12.8 oz)  50.349 kg (111 lb)   SpO2: 100% 97% 100% 99%    Intake/Output Summary (Last 24 hours) at 01/31/16 1352 Last data filed at 01/31/16 0600  Gross per 24 hour  Intake 1518.25 ml  Output      0 ml  Net 1518.25 ml   Filed Weights   01/29/16 1718 01/30/16 1814 01/31/16 0340  Weight: 46.2 kg (101 lb 13.6 oz) 50.259 kg (110 lb 12.8 oz) 50.349 kg (111 lb)    Examination:  General exam: Pleasant middle-aged female lying comfortably propped up in bed. Appears calm and comfortable  Respiratory system: Clear to auscultation. Respiratory effort normal. Cardiovascular system: S1 & S2 heard, RRR. No JVD, murmurs, rubs, gallops or clicks. No pedal edema. Telemetry: Sinus rhythm. Gastrointestinal system: Abdomen is nondistended, soft and nontender. No organomegaly or masses felt. Normal bowel sounds heard. Central nervous system: Alert and oriented. No focal neurological deficits. Extremities: Diffuse swelling of left lower extremity, especially of the thigh with mild increase in warmth and some tenderness but no significant erythema. Good peripheral pulses felt. No features suggestive of compartment syndrome. Skin: No rashes, lesions or ulcers Psychiatry: Judgement and insight appear normal. Mood & affect appropriate.     Data Reviewed: I have personally reviewed following labs and imaging studies  CBC:  Recent Labs Lab 01/29/16 1252 01/30/16 0429 01/31/16 0858  WBC 18.3* 15.0* 9.5  NEUTROABS 15.6*  --  8.5*  HGB 11.5* 8.8* 8.5*  HCT 34.7* 26.7* 26.1*  MCV 99.1 98.2 97.0  PLT 121* 116* 151   Basic Metabolic Panel:  Recent Labs Lab 01/29/16 1252 01/30/16 0429 01/31/16 0828  NA 132* 134* 132*  K 4.5 3.9 4.8  CL 95* 103 104  CO2 18* 21* 16*  GLUCOSE 125* 100* 107*  BUN CREATININE 1.34* 1.12* 0.84  CALCIUM 7.7* 6.9* 7.4*   GFR: Estimated Creatinine Clearance: 58.7 mL/min (by C-G formula based on Cr of 0.84). Liver  Function Tests:  Recent Labs Lab 01/29/16 1252 01/30/16 0429 01/31/16 0828  AST 478* 410* 172*  ALT 73* 69* 60*  ALKPHOS 258* 184* 190*  BILITOT 1.3* 1.0 1.0  PROT 5.9* 4.7* 5.0*  ALBUMIN 2.1* 1.6* 1.7*   No results for input(s): LIPASE, AMYLASE in the last 168 hours. No results for input(s): AMMONIA in the last 168 hours. Coagulation Profile:  Recent Labs Lab 01/29/16 1252 01/31/16 0541  INR 1.27 1.31   Cardiac Enzymes: No results for input(s): CKTOTAL, CKMB, CKMBINDEX, TROPONINI in the last 168 hours. BNP (last 3 results) No results for input(s): PROBNP in the last 8760 hours. HbA1C: No results for input(s): HGBA1C in the last 72 hours. CBG:  Recent Labs Lab 01/30/16 2150  GLUCAP 115*   Lipid Profile: No results for input(s): CHOL, HDL, LDLCALC, TRIG, CHOLHDL, LDLDIRECT in the last 72 hours. Thyroid Function Tests: No results for input(s): TSH, T4TOTAL, FREET4, T3FREE, THYROIDAB in the last 72 hours. Anemia Panel: No results for input(s): VITAMINB12, FOLATE, FERRITIN, TIBC, IRON, RETICCTPCT in the last 72 hours.      Radiology Studies: Ir US Guide Vasc Access Right  01/31/2016  INDICATION: 55 year old with left lower extremity DVT. Poor venous access and patient needs CT examination and IV access. EXAM: RIGHT ARM PICC LINE PLACEMENT  WITH ULTRASOUND AND FLUOROSCOPIC GUIDANCE MEDICATIONS: None ANESTHESIA/SEDATION: None FLUOROSCOPY TIME:  Fluoroscopy Time: 18 seconds, 0.2 mGy COMPLICATIONS: None immediate. PROCEDURE: The patient was advised of the possible risks and complications and agreed to undergo the procedure. The patient was then brought to the angiographic suite for the procedure. The right arm was prepped with chlorhexidine, draped in the usual sterile fashion using maximum barrier technique (cap and mask, sterile gown, sterile gloves, large sterile sheet, hand hygiene and cutaneous antiseptic). Local anesthesia was attained by infiltration with 1% lidocaine.  Ultrasound demonstrated patency of the right brachial vein, and this was documented with an image. Under real-time ultrasound guidance, this vein was accessed with a 21 gauge micropuncture needle and image documentation was performed. The needle was exchanged over a guidewire for a peel-away sheath through which a 32 cm 5 [place] dual lumen power injectable PICC was advanced, and positioned with its tip at the lower SVC/right atrial junction. Fluoroscopy during the procedure and fluoro spot radiograph confirms appropriate catheter position. The catheter was flushed, secured to the skin, and covered with a sterile dressing. IMPRESSION: Successful placement of a right arm PICC with sonographic and fluoroscopic guidance. The catheter is ready for use. Electronically Signed   By: Richarda Overlie M.D.   On: 01/31/2016 12:30   Dg Chest Port 1 View  01/29/2016  CLINICAL DATA:  Shortness of breath EXAM: PORTABLE CHEST 1 VIEW COMPARISON:  CTA chest dated 03/31/2015 FINDINGS: Patient is rotated and her chin obscures the right lung apex. Lungs are clear.  No pleural effusion or pneumothorax. Heart is normal in size. IMPRESSION: No evidence of acute cardiopulmonary disease. Electronically Signed   By: Charline Bills M.D.   On: 01/29/2016 19:00   Ir Fluoro Guide Cv Midline Picc Right  01/31/2016  INDICATION: 55 year old with left lower extremity DVT. Poor venous access and patient needs CT examination and IV access. EXAM: RIGHT ARM PICC LINE PLACEMENT WITH ULTRASOUND AND FLUOROSCOPIC GUIDANCE MEDICATIONS: None ANESTHESIA/SEDATION: None FLUOROSCOPY TIME:  Fluoroscopy Time: 18 seconds, 0.2 mGy COMPLICATIONS: None immediate. PROCEDURE: The patient was advised of the possible risks and complications and agreed to undergo the procedure. The patient was then brought to the angiographic suite for the procedure. The right arm was prepped with chlorhexidine, draped in the usual sterile fashion using maximum barrier technique (cap and  mask, sterile gown, sterile gloves, large sterile sheet, hand hygiene and cutaneous antiseptic). Local anesthesia was attained by infiltration with 1% lidocaine. Ultrasound demonstrated patency of the right brachial vein, and this was documented with an image. Under real-time ultrasound guidance, this vein was accessed with a 21 gauge micropuncture needle and image documentation was performed. The needle was exchanged over a guidewire for a peel-away sheath through which a 32 cm 5 [place] dual lumen power injectable PICC was advanced, and positioned with its tip at the lower SVC/right atrial junction. Fluoroscopy during the procedure and fluoro spot radiograph confirms appropriate catheter position. The catheter was flushed, secured to the skin, and covered with a sterile dressing. IMPRESSION: Successful placement of a right arm PICC with sonographic and fluoroscopic guidance. The catheter is ready for use. Electronically Signed   By: Richarda Overlie M.D.   On: 01/31/2016 12:30        Scheduled Meds: . calcium carbonate  1 tablet Oral BID WC  . gabapentin  100 mg Oral BID  . levothyroxine  25 mcg Oral QAC breakfast  . lidocaine      . pneumococcal 23 valent vaccine  0.5 mL Intramuscular Tomorrow-1000  . sodium chloride flush  3 mL Intravenous Q12H  . venlafaxine  25 mg Oral BID   Continuous Infusions: . sodium chloride 75 mL/hr at 01/30/16 1900  . heparin 650 Units/hr (01/31/16 0600)     LOS: 2 days    Time spent: 35 minutes   Archit Leger, MD [facility] Pager [phone]  If 7PM-7AM, please contact night-coverage [email or web address] Password [email or web address] 01/31/2016, 1:52 PM

## 2016-01-31 NOTE — Procedures (Signed)
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  Findings: DVT in left popliteal, femoral and iliac veins.  Clot extends into left common iliac vein.  IVC is patent.   Complications: None     Condition: Stable  Plan: Start catheter-directed infusion thru left leg. Start TNK at 0.4 mg/hr.  Heparin thru PICC or peripheral IV. Transfer to ICU bed.

## 2016-01-31 NOTE — Progress Notes (Signed)
Report received via Dedra Skeens RN in patient's room using SBAR format, reviewed VS, mesd and patient's general condition along with PMH, assumed care of patient.

## 2016-01-31 NOTE — Consult Note (Signed)
PULMONARY / CRITICAL CARE MEDICINE   Name: [name] MRN: [id number] DOB: 1961-01-25    ADMISSION DATE:  01/29/2016 CONSULTATION DATE:  01/31/2016  REFERRING MD:  Waymon Amato  CHIEF COMPLAINT:  Left lower extremity pain  HISTORY OF PRESENT ILLNESS:   55 year old female with PMH as below, which includes DM, HTN, Athsma, PE on Eliquis, Hypothyroid, and GERD. She presented to the emergency department 4/30 with complaints of Left lower extremity pain associated with edema and erythema x 2 days. Upon presentation to the ER she was found to have 10/10 pain to that leg with decreased mobility. At that tie she was also having some pleuritic chest pain. . She had bedside ultrasound in ED which demonstrated occlusive DVT throughout the L lower extremity and she was started on IV heparin. 5/2 she went to IR for venous catheter placement for thrombolysis, post-procedure she was transferred to ICU for continued lytics. PCCM to assume care in ICU.  PAST MEDICAL HISTORY :  She  has a past medical history of Glaucoma; Diabetes mellitus without complication (HCC); Hypertension; Neuropathy (HCC); Crohn disease (HCC); Asthma; Pulmonary embolus (HCC) (05/24/2014); Hypothyroidism; GERD (gastroesophageal reflux disease); HLD (hyperlipidemia); C. difficile colitis; Pancreatitis; Asthma; and Tobacco abuse.  PAST SURGICAL HISTORY: She  has past surgical history that includes Cesarean section; Esophagogastroduodenoscopy (N/A, 05/21/2014); Esophagogastroduodenoscopy (N/A, 03/02/2015); and Colonoscopy (N/A, 03/02/2015).  Allergies  Allergen Reactions  . Iohexol Other (See Comments)    "severe burning" Patient has received Contrast in 2005 with 13 hour pre-medication, and had no reaction at that time  . Spiriva Handihaler [Tiotropium Bromide Monohydrate] Nausea And Vomiting  . Ativan [Lorazepam] Other (See Comments)    hallucinations  . Penicillins Hives    No current facility-administered medications on file prior to  encounter.   Current Outpatient Prescriptions on File Prior to Encounter  Medication Sig  . calcium carbonate (OS-CAL - DOSED IN MG OF ELEMENTAL CALCIUM) 1250 (500 CA) MG tablet Take 1 tablet (500 mg of elemental calcium total) by mouth daily with breakfast.  . feeding supplement, RESOURCE BREEZE, (RESOURCE BREEZE) LIQD Take 1 Container by mouth 3 (three) times daily between meals.  . folic acid (FOLVITE) 1 MG tablet Take 1 tablet (1 mg total) by mouth daily.  Marland Kitchen gabapentin (NEURONTIN) 100 MG capsule Take 1 capsule (100 mg total) by mouth 2 (two) times daily.  Marland Kitchen HYDROcodone-acetaminophen (NORCO/VICODIN) 5-325 MG tablet Take 1 tablet by mouth every 6 (six) hours as needed.  Marland Kitchen HYDROmorphone (DILAUDID) 4 MG tablet Take 8 mg by mouth every 4 (four) hours as needed for severe pain.  Marland Kitchen levothyroxine (SYNTHROID, LEVOTHROID) 25 MCG tablet Take 1 tablet (25 mcg total) by mouth daily before breakfast.  . meclizine (ANTIVERT) 25 MG tablet Take 25 mg by mouth 3 (three) times daily as needed for dizziness.  . ondansetron (ZOFRAN ODT) 4 MG disintegrating tablet Take 1 tablet (4 mg total) by mouth every 8 (eight) hours as needed for nausea or vomiting.  . ondansetron (ZOFRAN) 4 MG tablet Take 4 mg by mouth every 8 (eight) hours as needed for nausea or vomiting.  Marland Kitchen oxyCODONE-acetaminophen (PERCOCET/ROXICET) 5-325 MG per tablet Take 1 tablet by mouth every 6 (six) hours as needed for moderate pain.  . pantoprazole (PROTONIX) 20 MG tablet Take 40 mg by mouth daily.  . rosuvastatin (CRESTOR) 20 MG tablet Take 1 tablet (20 mg total) by mouth daily.  Marland Kitchen saccharomyces boulardii (FLORASTOR) 250 MG capsule Take 1 capsule (250 mg total) by mouth  2 (two) times daily.  Marland Kitchen thiamine (VITAMIN B-1) 100 MG tablet Take 1 tablet (100 mg total) by mouth daily.  . traZODone (DESYREL) 25 mg TABS tablet Take 0.5 tablets (25 mg total) by mouth at bedtime as needed for sleep.  Marland Kitchen venlafaxine (EFFEXOR) 25 MG tablet Take 1 tablet (25 mg  total) by mouth 2 (two) times daily.  Marland Kitchen apixaban (ELIQUIS) 2.5 MG TABS tablet Take 1 tablet (2.5 mg total) by mouth 2 (two) times daily. (Patient not taking: Reported on 01/09/2016)  . cephALEXin (KEFLEX) 500 MG capsule Take 1 capsule (500 mg total) by mouth 2 (two) times daily. (Patient not taking: Reported on 01/29/2016)  . lactulose (CHRONULAC) 10 GM/15ML solution Take 15 mLs (10 g total) by mouth daily as needed for moderate constipation. (Patient not taking: Reported on 01/09/2016)  . metoprolol tartrate (LOPRESSOR) 25 MG tablet Take 0.5 tablets (12.5 mg total) by mouth 2 (two) times daily. (Patient not taking: Reported on 01/09/2016)    FAMILY HISTORY:  Her indicated that her mother is deceased. She indicated that her father is deceased.   SOCIAL HISTORY: She  reports that she has been smoking Cigarettes.  She has a 6.5 pack-year smoking history. She has never used smokeless tobacco. She reports that she does not drink alcohol or use illicit drugs.  REVIEW OF SYSTEMS:   Constitutional: weight loss, gain, night sweats, Fevers, chills, fatigue .  HEENT: headaches, Sore throat, sneezing, nasal congestion, post nasal drip, Difficulty swallowing, Tooth/dental problems, visual complaints visual changes, ear ache CV:  chest pain, radiates: ,Orthopnea, PND, swelling in lower extremities, dizziness, palpitations, syncope.  GI  heartburn, indigestion, abdominal pain, nausea, vomiting, diarrhea, change in bowel habits, loss of appetite, bloody stools.  Resp: cough, productive: , hemoptysis, dyspnea, chest pain, pleuritic.  Skin: rash or itching or icterus GU: dysuria, change in color of urine, urgency or frequency. flank pain, hematuria  MS:L leg pain improving. decreased range of motion  Psych: change in mood or affect. depression or anxiety.  Neuro: difficulty with speech, weakness, numbness, ataxia    SUBJECTIVE:    VITAL SIGNS: BP 120/87 mmHg  Pulse 78  Temp(Src) 98 F (36.7 C) (Oral)   Resp 16  Ht 5\' 5"  (1.651 m)  Wt 50.349 kg (111 lb)  BMI 18.47 kg/m2  SpO2 100%  LMP 10/01/2010  HEMODYNAMICS:    VENTILATOR SETTINGS:    INTAKE / OUTPUT: I/O last 3 completed shifts: In: 2731.2 [P.O.:660; I.V.:2071.2] Out: -   PHYSICAL EXAMINATION: General:  Very thin female in NAD Neuro:  Alert, oriented, non-focal HEENT:  [place]/AT, PERRL, no JVD Cardiovascular:  RRR, no MRG Lungs:  Clear bilateral breath sounds Abdomen:  Soft non-tender, non-distended Musculoskeletal:  No acute deformity. Intravenous catheter LLE. Decreased sensation L foot which is consistent with pre-op presentation , but movement increasing.  Skin:  Grossly intact  LABS:  BMET  Recent Labs Lab 01/29/16 1252 01/30/16 0429 01/31/16 0828  NA 132* 134* 132*  K 4.5 3.9 4.8  CL 95* 103 104  CO2 18* 21* 16*  BUN 13 14 11   CREATININE 1.34* 1.12* 0.84  GLUCOSE 125* 100* 107*    Electrolytes  Recent Labs Lab 01/29/16 1252 01/30/16 0429 01/31/16 0828  CALCIUM 7.7* 6.9* 7.4*    CBC  Recent Labs Lab 01/29/16 1252 01/30/16 0429 01/31/16 0858  WBC 18.3* 15.0* 9.5  HGB 11.5* 8.8* 8.5*  HCT 34.7* 26.7* 26.1*  PLT 121* 116* 151    Coag's  Recent Labs Lab  01/29/16 1252 01/31/16 0541  INR 1.27 1.31    Sepsis Markers No results for input(s): LATICACIDVEN, PROCALCITON, O2SATVEN in the last 168 hours.  ABG No results for input(s): PHART, PCO2ART, PO2ART in the last 168 hours.  Liver Enzymes  Recent Labs Lab 01/29/16 1252 01/30/16 0429 01/31/16 0828  AST 478* 410* 172*  ALT 73* 69* 60*  ALKPHOS 258* 184* 190*  BILITOT 1.3* 1.0 1.0  ALBUMIN 2.1* 1.6* 1.7*    Cardiac Enzymes No results for input(s): TROPONINI, PROBNP in the last 168 hours.  Glucose  Recent Labs Lab 01/30/16 2150  GLUCAP 115*    Imaging Ir US Guide Vasc Access Right  01/31/2016  INDICATION: 55 year old with left lower extremity DVT. Poor venous access and patient needs CT examination and IV access.  EXAM: RIGHT ARM PICC LINE PLACEMENT WITH ULTRASOUND AND FLUOROSCOPIC GUIDANCE MEDICATIONS: None ANESTHESIA/SEDATION: None FLUOROSCOPY TIME:  Fluoroscopy Time: 18 seconds, 0.2 mGy COMPLICATIONS: None immediate. PROCEDURE: The patient was advised of the possible risks and complications and agreed to undergo the procedure. The patient was then brought to the angiographic suite for the procedure. The right arm was prepped with chlorhexidine, draped in the usual sterile fashion using maximum barrier technique (cap and mask, sterile gown, sterile gloves, large sterile sheet, hand hygiene and cutaneous antiseptic). Local anesthesia was attained by infiltration with 1% lidocaine. Ultrasound demonstrated patency of the right brachial vein, and this was documented with an image. Under real-time ultrasound guidance, this vein was accessed with a 21 gauge micropuncture needle and image documentation was performed. The needle was exchanged over a guidewire for a peel-away sheath through which a 32 cm 5 [place] dual lumen power injectable PICC was advanced, and positioned with its tip at the lower SVC/right atrial junction. Fluoroscopy during the procedure and fluoro spot radiograph confirms appropriate catheter position. The catheter was flushed, secured to the skin, and covered with a sterile dressing. IMPRESSION: Successful placement of a right arm PICC with sonographic and fluoroscopic guidance. The catheter is ready for use. Electronically Signed   By: Richarda Overlie M.D.   On: 01/31/2016 12:30   Ir Fluoro Guide Cv Midline Picc Right  01/31/2016  INDICATION: 55 year old with left lower extremity DVT. Poor venous access and patient needs CT examination and IV access. EXAM: RIGHT ARM PICC LINE PLACEMENT WITH ULTRASOUND AND FLUOROSCOPIC GUIDANCE MEDICATIONS: None ANESTHESIA/SEDATION: None FLUOROSCOPY TIME:  Fluoroscopy Time: 18 seconds, 0.2 mGy COMPLICATIONS: None immediate. PROCEDURE: The patient was advised of the possible risks  and complications and agreed to undergo the procedure. The patient was then brought to the angiographic suite for the procedure. The right arm was prepped with chlorhexidine, draped in the usual sterile fashion using maximum barrier technique (cap and mask, sterile gown, sterile gloves, large sterile sheet, hand hygiene and cutaneous antiseptic). Local anesthesia was attained by infiltration with 1% lidocaine. Ultrasound demonstrated patency of the right brachial vein, and this was documented with an image. Under real-time ultrasound guidance, this vein was accessed with a 21 gauge micropuncture needle and image documentation was performed. The needle was exchanged over a guidewire for a peel-away sheath through which a 32 cm 5 [place] dual lumen power injectable PICC was advanced, and positioned with its tip at the lower SVC/right atrial junction. Fluoroscopy during the procedure and fluoro spot radiograph confirms appropriate catheter position. The catheter was flushed, secured to the skin, and covered with a sterile dressing. IMPRESSION: Successful placement of a right arm PICC with sonographic and fluoroscopic guidance. The  catheter is ready for use. Electronically Signed   By: Richarda Overlie M.D.   On: 01/31/2016 12:30   Ct Venogram Abd/pel  01/31/2016  CLINICAL DATA:  55 year old with a left lower extremity DVT and complains of left leg pain and swelling. Patient is being evaluated for possible catheter directed thrombolytic therapy. Recent venous duplex examination could not evaluate the iliac venous system. Evaluate for pelvic DVT and May-Thurner syndrome. EXAM: CTA ABDOMEN AND PELVIS WITH CONTRAST (CT VENOGRAM) TECHNIQUE: Multidetector CT imaging of the abdomen and pelvis was performed using the standard protocol during bolus administration of intravenous contrast. Multiplanar reconstructed images and MIPs were obtained and reviewed to evaluate the vascular anatomy. CONTRAST:  ISOVUE-300 IOPAMIDOL  (ISOVUE-300) INJECTION 61% COMPARISON:  01/09/2016 FINDINGS: Venous structures: Hepatic veins, IVC and bilateral renal veins are patent. There is a small amount of thrombus in the distal IVC extending from the left common iliac vein. 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 There is thrombus in the left common and left external iliac vein. There is edema throughout the left thigh and evidence for filling defects in the left common femoral vein and left femoral vein. Right femoral veins and right iliac veins are patent without distension. Portal venous system is patent. Arterial structures: Atherosclerotic disease in the aorta and iliac arteries. There is at least mild narrowing in the right common iliac artery. Circumferential calcifications in the superficial femoral arteries bilaterally. Lower chest: There is a 2.0 cm nodular pleural-based density along the lateral left lower lobe on sequence 3, image 17. Previously, there were pleural-based densities in this area but this area has clearly enlarged from 01/09/2016. The pleural-based structure has fluid density and could represent a small amount of loculated fluid. Otherwise, the lung bases are clear. Nonvascular structures: Again noted is a 1.9 cm low-density structure along the hepatic dome. This could be related to a cyst. Low density throughout the liver parenchyma is suggestive for hepatic steatosis. The distal common bile duct is enlarged measuring up to 1.9 cm and previously measured 1.4 cm. There is no significant inflammation. No significant enlargement of the gallbladder. Dilatation of the main pancreatic duct without inflammatory changes. Normal appearance of spleen without enlargement. Normal appearance of both adrenal glands. No acute abnormality to either kidney. Again noted is a small cortical calcification involving the right kidney lower  pole. No acute abnormality to the stomach, small bowel or colon. There is some distension of the rectum. Normal appearance of the appendix. There appears to be a small uterus but poorly characterized. Small amount of fluid and edema in the abdomen. Extensive edema throughout the lower abdominal and pelvic subcutaneous tissues. Subcutaneous edema in the left upper extremity. Again noted is soft tissue fullness in the perineum. No acute bone abnormality. Review of the MIP images confirms the above findings. IMPRESSION: Left lower extremity deep vein thrombosis extends into the left iliac veins with a small amount of clot extending into the distal IVC. In addition, there is compression of the left common iliac vein from the right common iliac artery. 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 Extensive subcutaneous edema throughout the left lower extremity and pelvis. Increased distension of the common bile duct of uncertain etiology. This structure has been dilated in the past but appears to be enlarging. Patient does have elevated liver enzymes. Recommend GI consultation and consider further evaluation  with MRCP. Pancreatic duct is also dilated. Hepatic steatosis. 2 cm pleural-based density in the left lower lobe may be related to fluid but this has increased since an exam on 01/09/2016. Recommend a 6-8 week follow-up chest CT to ensure resolution. Electronically Signed   By: Richarda Overlie M.D.   On: 01/31/2016 16:13     STUDIES:  CT venogram 5/2 > Left lower extremity deep vein thrombosis extends into the left iliac veins with a small amount of clot extending into the distal IVC. In addition, there is compression of the left common iliac vein from the right common iliac artery. 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 Increased distension of the common bile duct of uncertain etiology. This structure has been dilated in the  past but appears to be enlarging. Patient does have elevated liver enzymes. Recommend GI consultation and consider further evaluation with MRCP. Pancreatic duct is also dilated. Hepatic steatosis. 2 cm pleural-based density in the left lower lobe may be related to fluid but this has increased since an exam on 01/09/2016. Recommend a 6-8 week follow-up chest CT to ensure resolution.  CULTURES:   ANTIBIOTICS:   SIGNIFICANT EVENTS: 4/30 admit 5/2 IR thrombolysis  LINES/TUBES: 5/2 RUE PICC  DISCUSSION: 55 year old female with multiple medical problems admitted with extensive LLE DVT. 5/2 underwent thrombolysis with indwelling IR catheter. POst-procedure transferred to ICU for continued TNK infusion.  ASSESSMENT / PLAN:  PULMONARY A: Dyspnea > resolved - there was some concern for PE. Contrast allergy and unable to tolerate VQ. No R heart strain on Echo.  H/o asthma without acute exacerbation  P:   Supplemental O2 if indicated to keep SpO2 > 92% Anticoagulated with IV heprain  CARDIOVASCULAR A:  HTN  P:  Telemetry monitoring Holding outpatient anti-hypertension  RENAL A:   NAG metabolic acidosis, appears chronic  P:   Follow BMP  GASTROINTESTINAL A:   Elevated transaminases (improving) Hepatic steatosis  Hx pancreatitis , Chron's.   P:   Heart healthy diet Protonix  HEMATOLOGIC A:   Extensive LLE DVT Acute anemia - concern GIB as history of this, but HGB has been stable.   P:  Per IR Now on intravenous TNK via IR placed catheter.  IV heparin Follow CBC closely as is now on TNK Holding home apixaban Prednisone per IR  INFECTIOUS A:   No acute issues  P:   Follow WBC and fever curve No ABX  ENDOCRINE A:   DM Hypothyroid   P:   Continue home synthroid  NEUROLOGIC A:   No acute issues  P:   monitor   FAMILY  - Updates: JY updated patient in ICU  - Inter-disciplinary family meet or Palliative Care meeting due by:  5/2   Joneen Roach, AGACNP-BC [facility] Pager [phone] or [phone]  01/31/2016 5:53 PM  Attending Note:  55 year old female with extensive PMH who was noted to have a large DVT on the left and decision was made to proceed with the EKOS device. Was admitted to the ICU afterwards for close observation while lytics are being infused. PCCM was consulted. On exam, lungs are clear, left sided edema on the legs noted. I reviewed CXR myself, no acute disease. Discussed with TRH-MD and PCCM-NP.  Left leg pain due to DVT. - EKOS. - Monitor.  DVT: - Continue lytics. - Heparin per protocol. - Monitor closely in the ICU.  Metabolic acidosis: non-gapped due to hyponatremia,  improving. - IVF as ordered. - BMET in AM.  Hyponatremia: cause unclear. - BMET in AM. - NS. - Replace as needed.  Anemia: Hg of 8.5. - Monitor for bleeding. - Transfuse per ICU protocol.  Left foot lack of sensation: evidently old in nature. - Monitor. - May need to be evaluated after she can be mobile.  Patient seen and examined, agree with above note. I dictated the care and orders written for this patient under my direction.  Alyson Reedy, MD [phone]

## 2016-01-31 NOTE — Procedures (Signed)
Placement of right arm brachial PICC.  Tip at SVC/RA junction.  Ready to use.  Minimal blood loss and no immediate complication.

## 2016-01-31 NOTE — Progress Notes (Signed)
Initial Nutrition Assessment  DOCUMENTATION CODES:   Underweight  INTERVENTION:  -Glucerna TID. Each supplement provides 220 kcals and 10 grams of protein. -Prostat BID. Each supplement provides 100 kcals and 15 grams of protein.   NUTRITION DIAGNOSIS:   Increased nutrient needs related to chronic illness as evidenced by estimated needs.  GOAL:   Patient will meet greater than or equal to 90% of their needs  MONITOR:   PO intake, Supplement acceptance, Labs, Weight trends, Skin, I & O's  REASON FOR ASSESSMENT:   Malnutrition Screening Tool    ASSESSMENT:   55 year old female with multiple comorbidities including type 2 diabetes mellitus, hypothyroidism, admitted to medicine service on 01/29/2016 when she presented with complaints of extensive left lower extremity swelling, erythema, pain. She was diagnosed with deep venous thrombosis including posterior tibial, peroneal, popliteal, femoral and common femoral veins. She was started on IV heparin. Case was discussed with Dr. Bonnielee Haff of interventional radiology who agreed that she would be candidate for thrombectomy. On 01/29/2006 spoke with interventional radiology who recommended transfer to [facility] to undergo procedure.  Pt currently in procedure at [facility]. Unable to speak with pt.  Spoke with RN who stated pt did not eat much breakfast.  Per chart, pt ate 75% of meal on 4/30.  Pt weight is 111 lbs which is a 24% increase since 01/09/16.  Suspect wt gain d/t edema in lower extremities.  Per physician note, pt is severely malnourished. Will order Glucerna and prostat to supplement PO's once diet is advanced.   Unable to perform NFPE. Will attempt at follow up. Labs reviewed; na 132, BUN 16, Ca 7.4, CBGs 115. Meds reviewed.  Diet Order:  Diet NPO time specified Except for: Sips with Meds  Skin:  Reviewed, no issues  Last BM:  unknown  Height:   Ht Readings from Last 1 Encounters:  01/30/16  (1.651 m)    Weight:   Wt  Readings from Last 1 Encounters:  01/31/16 111 lb (50.349 kg)    Ideal Body Weight:  56.8 kg  BMI:  Body mass index is 18.47 kg/(m^2).  Estimated Nutritional Needs:   Kcal:  1500-1700 (30 kcals/kg)  Protein:  75-85 g (1.5 g/kg)  Fluid:  1.5-1.7 L  EDUCATION NEEDS:   No education needs identified at this time  Beryle Quant, MS [facility] Dietetic Intern Pager [phone]

## 2016-01-31 NOTE — Progress Notes (Signed)
ANTICOAGULATION CONSULT NOTE  - FOLLOW UP  Pharmacy Consult:  Heparin Indication:   New LLE DVT  Allergies  Allergen Reactions  . Iohexol Other (See Comments)    "severe burning" Patient has received Contrast in 2005 with 13 hour pre-medication, and had no reaction at that time  . Spiriva Handihaler [Tiotropium Bromide Monohydrate] Nausea And Vomiting  . Ativan [Lorazepam] Other (See Comments)    hallucinations  . Penicillins Hives   Patient Measurements: Height: 5\' 5"  (165.1 cm) Weight: 111 lb (50.349 kg) IBW/kg (Calculated) : 57 Heparin Dosing Weight: 50 kg  Vital Signs: BP: 109/65 mmHg (05/02 1800) Pulse Rate: 75 (05/02 1800)  Labs:  Recent Labs  01/29/16 1252  01/30/16 0429  01/30/16 1922 01/31/16 0541 01/31/16 0828 01/31/16 0858 01/31/16 1816  HGB 11.5*  --  8.8*  --   --   --   --  8.5*  --   HCT 34.7*  --  26.7*  --   --   --   --  26.1*  --   PLT 121*  --  116*  --   --   --   --  151  --   LABPROT 16.0*  --   --   --   --  16.4*  --   --   --   INR 1.27  --   --   --   --  1.31  --   --   --   HEPARINUNFRC <0.10*  < >  --   < > 0.19* 0.56  --   --  0.42  CREATININE 1.34*  --  1.12*  --   --   --  0.84  --   --   < > = values in this interval not displayed. Estimated Creatinine Clearance: 58.7 mL/min (by C-G formula based on Cr of 0.84).    Assessment: 84 YOF with new LLE DVT to continue on IV heparin.  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 S/p IR catheter placement for thrombolysis that is currently on lytic therapy. Confirmatory heparin level 0.42 on heparin infusion of 650 units/hr. Stable CBC.    Goal of Therapy:  Heparin level 0.3-0.7 units/ml Monitor platelets by anticoagulation protocol: Yes    Plan:  - Continue heparin gtt at 650 units/hr - Daily HL / CBC - F/U long-term [facility] plan   Pollyann Samples, PharmD, BCPS 01/31/2016, 6:47 PM Pager: [phone]

## 2016-01-31 NOTE — Sedation Documentation (Signed)
Informed pt of sedation once we are in IR, pt agreeable and does not appear in severe pain.  Talking on phone with son.  Events explained to pt.

## 2016-01-31 NOTE — Progress Notes (Signed)
ANTICOAGULATION CONSULT NOTE  - FOLLOW UP  Pharmacy Consult:  Heparin Indication:   New LLE DVT  Allergies  Allergen Reactions  . Iohexol Other (See Comments)    "severe burning" Patient has received Contrast in 2005 with 13 hour pre-medication, and had no reaction at that time  . Spiriva Handihaler [Tiotropium Bromide Monohydrate] Nausea And Vomiting  . Ativan [Lorazepam] Other (See Comments)    hallucinations  . Penicillins Hives   Patient Measurements: Height: 5\' 5"  (165.1 cm) Weight: 111 lb (50.349 kg) IBW/kg (Calculated) : 57 Heparin Dosing Weight: 50 kg  Vital Signs: Temp: 98 F (36.7 C) (05/02 0340) Temp Source: Oral (05/02 0340) BP: 96/78 mmHg (05/02 0340) Pulse Rate: 82 (05/02 0340)  Labs:  Recent Labs  01/29/16 1252  01/30/16 0429 01/30/16 0751 01/30/16 1922 01/31/16 0541  HGB 11.5*  --  8.8*  --   --   --   HCT 34.7*  --  26.7*  --   --   --   PLT 121*  --  116*  --   --   --   LABPROT 16.0*  --   --   --   --  16.4*  INR 1.27  --   --   --   --  1.31  HEPARINUNFRC <0.10*  < >  --  0.82* 0.19* 0.56  CREATININE 1.34*  --  1.12*  --   --   --   < > = values in this interval not displayed. Estimated Creatinine Clearance: 44 mL/min (by C-G formula based on Cr of 1.12).    Assessment: 54 YOF with new LLE DVT to continue on IV heparin.  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  Heparin currently therapeutic; no bleeding reported although she is anemic and thrombocytopenic.   Goal of Therapy:  Heparin level 0.3-0.7 units/ml Monitor platelets by anticoagulation protocol: Yes    Plan:  - Continue heparin gtt at 650 units/hr - Check confirmatory HL - Daily HL / CBC - F/U long-term AC plan   Tammra Pressman D. Laney Potash, PharmD, BCPS Pager:  [phone] 01/31/2016, 9:23 AM

## 2016-01-31 NOTE — Sedation Documentation (Addendum)
Pt more comfortable, unable to lie on stomach, pt is in lateral position with left hip down, refuses to attempt to move to abdomen even after pain meds.  Prep completed by Marinus Maw, RT

## 2016-01-31 NOTE — Sedation Documentation (Addendum)
O2 2l/[place] applied 

## 2016-01-31 NOTE — Sedation Documentation (Signed)
Moved to IR suite

## 2016-02-01 ENCOUNTER — Inpatient Hospital Stay (HOSPITAL_COMMUNITY): Payer: Medicare Other

## 2016-02-01 DIAGNOSIS — I82402 Acute embolism and thrombosis of unspecified deep veins of left lower extremity: Secondary | ICD-10-CM

## 2016-02-01 DIAGNOSIS — I10 Essential (primary) hypertension: Secondary | ICD-10-CM

## 2016-02-01 DIAGNOSIS — E43 Unspecified severe protein-calorie malnutrition: Secondary | ICD-10-CM

## 2016-02-01 DIAGNOSIS — I878 Other specified disorders of veins: Secondary | ICD-10-CM

## 2016-02-01 DIAGNOSIS — Z86711 Personal history of pulmonary embolism: Secondary | ICD-10-CM

## 2016-02-01 LAB — CBC
HEMATOCRIT: 24.2 % — AB (ref 36.0–46.0)
HEMOGLOBIN: 7.8 g/dL — AB (ref 12.0–15.0)
MCH: 32.9 pg (ref 26.0–34.0)
MCHC: 32.2 g/dL (ref 30.0–36.0)
MCV: 102.1 fL — ABNORMAL HIGH (ref 78.0–100.0)
Platelets: 139 10*3/uL — ABNORMAL LOW (ref 150–400)
RBC: 2.37 MIL/uL — ABNORMAL LOW (ref 3.87–5.11)
RDW: 16.4 % — AB (ref 11.5–15.5)
WBC: 10.5 10*3/uL (ref 4.0–10.5)

## 2016-02-01 LAB — COMPREHENSIVE METABOLIC PANEL
ALBUMIN: 1.6 g/dL — AB (ref 3.5–5.0)
ALK PHOS: 157 U/L — AB (ref 38–126)
ALT: 49 U/L (ref 14–54)
ANION GAP: 10 (ref 5–15)
AST: 89 U/L — ABNORMAL HIGH (ref 15–41)
BUN: 8 mg/dL (ref 6–20)
CHLORIDE: 106 mmol/L (ref 101–111)
CO2: 20 mmol/L — AB (ref 22–32)
CREATININE: 0.75 mg/dL (ref 0.44–1.00)
Calcium: 7.4 mg/dL — ABNORMAL LOW (ref 8.9–10.3)
GFR calc Af Amer: >60 mL/min (ref >60)
GFR calc non Af Amer: >60 mL/min (ref >60)
GLUCOSE: 157 mg/dL — AB (ref 65–99)
Potassium: 4.1 mmol/L (ref 3.5–5.1)
SODIUM: 136 mmol/L (ref 135–145)
Total Bilirubin: 1.3 mg/dL — ABNORMAL HIGH (ref 0.3–1.2)
Total Protein: 4.7 g/dL — ABNORMAL LOW (ref 6.5–8.1)

## 2016-02-01 LAB — GLUCOSE, CAPILLARY
GLUCOSE-CAPILLARY: 161 mg/dL — AB (ref 65–99)
GLUCOSE-CAPILLARY: 214 mg/dL — AB (ref 65–99)
GLUCOSE-CAPILLARY: 180 mg/dL — AB (ref 65–99)
Glucose-Capillary: 223 mg/dL — ABNORMAL HIGH (ref 65–99)

## 2016-02-01 LAB — HEPARIN LEVEL (UNFRACTIONATED): HEPARIN UNFRACTIONATED: 0.35 [IU]/mL (ref 0.30–0.70)

## 2016-02-01 LAB — FIBRINOGEN: FIBRINOGEN: 205 mg/dL (ref 204–475)

## 2016-02-01 MED ORDER — IOPAMIDOL (ISOVUE-300) INJECTION 61%
INTRAVENOUS | Status: AC
Start: 2016-02-01 — End: 2016-02-01
  Administered 2016-02-01: 50 mL
  Filled 2016-02-01: qty 100

## 2016-02-01 MED ORDER — "THROMBI-PAD 3""X3"" EX PADS"
1.0000 | MEDICATED_PAD | Freq: Once | CUTANEOUS | Status: AC
  Administered 2016-02-01: 1 via TOPICAL
  Filled 2016-02-01: qty 1

## 2016-02-01 MED ORDER — FENTANYL CITRATE (PF) 100 MCG/2ML IJ SOLN
INTRAMUSCULAR | Status: AC
  Filled 2016-02-01: qty 2

## 2016-02-01 MED ORDER — FENTANYL CITRATE (PF) 100 MCG/2ML IJ SOLN
INTRAMUSCULAR | Status: AC | PRN
  Administered 2016-02-01 (×2): 25 ug via INTRAVENOUS
  Administered 2016-02-01: 50 ug via INTRAVENOUS
  Administered 2016-02-01 (×2): 25 ug via INTRAVENOUS

## 2016-02-01 MED ORDER — LIDOCAINE HCL 1 % IJ SOLN
INTRAMUSCULAR | Status: AC
  Filled 2016-02-01: qty 20

## 2016-02-01 MED ORDER — IOPAMIDOL (ISOVUE-300) INJECTION 61%
INTRAVENOUS | Status: AC
  Administered 2016-02-01: 30 mL
  Filled 2016-02-01: qty 50

## 2016-02-01 NOTE — Progress Notes (Signed)
Spoke to [employer] in [employer]. RN was inquiring about the time they would schedule the patient for the procedure tomorrow because her pre-procedure meds are scheduled 13 hours in advance. Herbert Seta said they would notify the PA and the PA would schedule the medications appropriately.

## 2016-02-01 NOTE — Progress Notes (Signed)
ANTICOAGULATION CONSULT NOTE  - FOLLOW UP  Pharmacy Consult:  Heparin Indication:   New LLE DVT  Allergies  Allergen Reactions  . Iohexol Other (See Comments)    "severe burning" Patient has received Contrast in 2005 with 13 hour pre-medication, and had no reaction at that time  . Spiriva Handihaler [Tiotropium Bromide Monohydrate] Nausea And Vomiting  . Ativan [Lorazepam] Other (See Comments)    hallucinations  . Penicillins Hives   Patient Measurements: Height: 5\' 5"  (165.1 cm) Weight: 111 lb 12.4 oz (50.7 kg) IBW/kg (Calculated) : 57 Heparin Dosing Weight: 50 kg  Vital Signs: Temp: 98.5 F (36.9 C) (05/03 1209) Temp Source: Oral (05/03 1209) BP: 91/63 mmHg (05/03 1200) Pulse Rate: 68 (05/03 1200)  Labs:  Recent Labs  01/30/16 0429  01/31/16 0541 01/31/16 0828 01/31/16 0858 01/31/16 1430 01/31/16 1816 02/01/16 0400  HGB 8.8*  --   --   --  8.5*  --   --  7.8*  HCT 26.7*  --   --   --  26.1*  --   --  24.2*  PLT 116*  --   --   --  151  --   --  139*  LABPROT  --   --  16.4*  --   --   --   --   --   INR  --   --  1.31  --   --   --   --   --   HEPARINUNFRC  --   < > 0.56  --   --   --  0.42 0.35  CREATININE 1.12*  --   --  0.84  --   --   --  0.75  CKTOTAL  --   --   --   --   --  116  --   --   < > = values in this interval not displayed. Estimated Creatinine Clearance: 62.1 mL/min (by C-G formula based on Cr of 0.75).    Assessment: 80 YOF with new LLE DVT to continue on IV heparin.  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 S/p IR catheter placement for thrombolysis, she is currently on catheter-directed TNKase. Pt is therapeutic on systemic heparin.    Goal of Therapy:  Heparin level 0.3-0.7 units/ml Monitor platelets by anticoagulation protocol: Yes    Plan:  - Continue heparin gtt at 650 units/hr - Daily HL / CBC - F/U long-term [facility] plan     Agapito Games, PharmD, BCPS Clinical Pharmacist 02/01/2016 2:17 PM

## 2016-02-01 NOTE — Progress Notes (Signed)
Discussed with Dr. Molli Knock on 5/2 & Dr. Tyson Alias, CCM today. Patient has been transferred to ICU under CCM service. TR[facility] will sign off. Please call us for future assistance.  Marcellus Scott, MD, FACP, FHM. [facility] Pager [phone]  If 7PM-7AM, please contact night-coverage [email or web address] Password [facility] 02/01/2016, 9:38 AM

## 2016-02-01 NOTE — Care Management Important Message (Signed)
Important Message  Patient Details  Name: [name] MRN: [id number] Date of Birth: 1961/09/13   Medicare Important Message Given:  Yes    Dajanay Northrup Abena 02/01/2016, 12:01 PM

## 2016-02-01 NOTE — Sedation Documentation (Signed)
Patient denies pain and is resting comfortably.  

## 2016-02-01 NOTE — Progress Notes (Signed)
Referring Physician(s): Jeralyn Bennett  Supervising Physician: Ruel Favors  Patient Status: In-pt  Chief Complaint:  Left leg pain and swelling  Subjective:  Ms Selmer states her left leg is feeling better.  She feels it is not as "heavy" and it is not as painful to touch.  She did have a full breakfast this morning at 10:00 am.  Allergies: Iohexol; Spiriva handihaler; Ativan; and Penicillins  Medications: Prior to Admission medications   Medication Sig Start Date End Date Taking? Authorizing Provider  calcium carbonate (OS-CAL - DOSED IN MG OF ELEMENTAL CALCIUM) 1250 (500 CA) MG tablet Take 1 tablet (500 mg of elemental calcium total) by mouth daily with breakfast. 03/04/15  Yes Alison Murray, MD  feeding supplement, RESOURCE BREEZE, (RESOURCE BREEZE) LIQD Take 1 Container by mouth 3 (three) times daily between meals. 08/02/14  Yes Kathlen Mody, MD  folic acid (FOLVITE) 1 MG tablet Take 1 tablet (1 mg total) by mouth daily. 08/02/14  Yes Kathlen Mody, MD  gabapentin (NEURONTIN) 100 MG capsule Take 1 capsule (100 mg total) by mouth 2 (two) times daily. 04/05/15  Yes Vassie Loll, MD  HYDROcodone-acetaminophen (NORCO/VICODIN) 5-325 MG tablet Take 1 tablet by mouth every 6 (six) hours as needed. 01/09/16  Yes Derwood Kaplan, MD  HYDROmorphone (DILAUDID) 4 MG tablet Take 8 mg by mouth every 4 (four) hours as needed for severe pain.   Yes Historical Provider, MD  levothyroxine (SYNTHROID, LEVOTHROID) 25 MCG tablet Take 1 tablet (25 mcg total) by mouth daily before breakfast. 08/02/14  Yes Kathlen Mody, MD  meclizine (ANTIVERT) 25 MG tablet Take 25 mg by mouth 3 (three) times daily as needed for dizziness.   Yes Historical Provider, MD  ondansetron (ZOFRAN ODT) 4 MG disintegrating tablet Take 1 tablet (4 mg total) by mouth every 8 (eight) hours as needed for nausea or vomiting. 01/09/16  Yes Ankit Nanavati, MD  ondansetron (ZOFRAN) 4 MG tablet Take 4 mg by mouth every 8 (eight) hours as  needed for nausea or vomiting.   Yes Historical Provider, MD  oxyCODONE-acetaminophen (PERCOCET/ROXICET) 5-325 MG per tablet Take 1 tablet by mouth every 6 (six) hours as needed for moderate pain. 04/05/15  Yes Vassie Loll, MD  pantoprazole (PROTONIX) 20 MG tablet Take 40 mg by mouth daily.   Yes Historical Provider, MD  rosuvastatin (CRESTOR) 20 MG tablet Take 1 tablet (20 mg total) by mouth daily. 08/02/14  Yes Kathlen Mody, MD  saccharomyces boulardii (FLORASTOR) 250 MG capsule Take 1 capsule (250 mg total) by mouth 2 (two) times daily. 04/05/15  Yes Vassie Loll, MD  thiamine (VITAMIN B-1) 100 MG tablet Take 1 tablet (100 mg total) by mouth daily. 08/02/14  Yes Kathlen Mody, MD  traZODone (DESYREL) 25 mg TABS tablet Take 0.5 tablets (25 mg total) by mouth at bedtime as needed for sleep. 08/02/14  Yes Kathlen Mody, MD  venlafaxine (EFFEXOR) 25 MG tablet Take 1 tablet (25 mg total) by mouth 2 (two) times daily. 03/04/15  Yes Alison Murray, MD  apixaban (ELIQUIS) 2.5 MG TABS tablet Take 1 tablet (2.5 mg total) by mouth 2 (two) times daily. Patient not taking: Reported on 01/09/2016 04/05/15   Vassie Loll, MD  cephALEXin (KEFLEX) 500 MG capsule Take 1 capsule (500 mg total) by mouth 2 (two) times daily. Patient not taking: Reported on 01/29/2016 01/09/16   Derwood Kaplan, MD  lactulose (CHRONULAC) 10 GM/15ML solution Take 15 mLs (10 g total) by mouth daily as needed for moderate  constipation. Patient not taking: Reported on 01/09/2016 04/05/15   Vassie Loll, MD  metoprolol tartrate (LOPRESSOR) 25 MG tablet Take 0.5 tablets (12.5 mg total) by mouth 2 (two) times daily. Patient not taking: Reported on 01/09/2016 04/05/15   Vassie Loll, MD     Vital Signs: BP 103/78 mmHg  Pulse 85  Temp(Src) 97.7 F (36.5 C) (Oral)  Resp 22  Ht 5\' 5"  (1.651 m)  Wt 111 lb 12.4 oz (50.7 kg)  BMI 18.60 kg/m2  SpO2 98%  LMP 10/01/2010  Physical Exam  Awake and alert Improvement in left leg edema. Not as  tender. Catheter in place left pop vein.  Imaging: Ir US Guide Vasc Access Left  01/31/2016  INDICATION: Symptomatic left lower extremity deep vein thrombosis. 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 Plan for catheter directed thrombolytic therapy. EXAM: LEFT LOWER EXTREMITY VENOGRAM; INITIATION OF CATHETER DIRECTED THROMBOLYSIS IN THE LEFT LOWER EXTREMITY AND PELVIS; ULTRASOUND GUIDANCE FOR VASCULAR ACCESS COMPARISON:  CT venogram 01/31/2016 MEDICATIONS: 25 mg Benadryl IV. The patient was pre-medicated with Benadryl and prednisone for Iohexol allergy. Patient tolerated the recent CT venogram without symptoms. ANESTHESIA/SEDATION: Versed 0.5 mg IV; Fentanyl 50 mcg IV Moderate Sedation Time:  11 minutes The patient was continuously monitored during the procedure by the interventional radiology nurse under my direct supervision. FLUOROSCOPY TIME:  Fluoroscopy Time: 4 minutes 36 seconds CONTRAST:  20 mL Omnipaque 300 COMPLICATIONS: None immediate. TECHNIQUE: Informed written consent was obtained from the patient after a thorough discussion of the procedural risks, benefits and alternatives. All questions were addressed. Maximal Sterile Barrier Technique was utilized including caps, mask, sterile gowns, sterile gloves, sterile drape, hand hygiene and skin antiseptic. A timeout was performed prior to the initiation of the procedure. Patient was placed prone. The left popliteal region was prepped and draped in a sterile fashion. Skin was anesthetized with 1% lidocaine. 21 gauge needle directed into the occluded popliteal vein with ultrasound guidance. A micropuncture dilator set was placed. Small amount of contrast was injected to confirm placement in the vein. A Bentson wire was placed and a 6 [place] vascular sheath was placed. A 100 cm, 5 French catheter was easily advanced into the left common iliac vein. Small amount of contrast  was injected to confirm thrombus throughout the left femoral vein and left iliac veins. Collateral vessels were also identified. Glidewire was used to cannulate the IVC and catheter was advanced into the lower IVC. IVC venogram was performed. The 5 French catheter was removed and a 90 cm (50 cm infusion length) catheter was advanced over the Bentson wire. Tip of the catheter was placed in the distal IVC. Distal aspect of the catheter is in the distal femoral vein. Catheter directed thrombolysis was started at 0.4 mg/hr of tenecteplase. The sheath was sutured to the skin. Bandage placed over the sheath and infusion catheter. FINDINGS: Thrombosis of the left popliteal vein by ultrasound. Left lower extremity venogram confirmed thrombus throughout the left femoral vein and left iliac veins. The IVC is patent. Infusion catheter placed in the left femoral and left iliac veins. IMPRESSION: Initiation of catheter directed thrombolysis in the left lower extremity and pelvis. Plan for catheter directed thrombolysis overnight. Patient will return to Interventional Radiology on 02/01/2016 for follow-up venogram with possible balloon angioplasty and stent placement in the left common iliac vein. Electronically Signed   By: Richarda Overlie M.D.   On: 01/31/2016 17:33  Ir US Guide Vasc Access Right  01/31/2016  INDICATION: 55 year old with left lower extremity DVT. Poor venous access and patient needs CT examination and IV access. EXAM: RIGHT ARM PICC LINE PLACEMENT WITH ULTRASOUND AND FLUOROSCOPIC GUIDANCE MEDICATIONS: None ANESTHESIA/SEDATION: None FLUOROSCOPY TIME:  Fluoroscopy Time: 18 seconds, 0.2 mGy COMPLICATIONS: None immediate. PROCEDURE: The patient was advised of the possible risks and complications and agreed to undergo the procedure. The patient was then brought to the angiographic suite for the procedure. The right arm was prepped with chlorhexidine, draped in the usual sterile fashion using maximum barrier technique  (cap and mask, sterile gown, sterile gloves, large sterile sheet, hand hygiene and cutaneous antiseptic). Local anesthesia was attained by infiltration with 1% lidocaine. Ultrasound demonstrated patency of the right brachial vein, and this was documented with an image. Under real-time ultrasound guidance, this vein was accessed with a 21 gauge micropuncture needle and image documentation was performed. The needle was exchanged over a guidewire for a peel-away sheath through which a 32 cm 5 [place] dual lumen power injectable PICC was advanced, and positioned with its tip at the lower SVC/right atrial junction. Fluoroscopy during the procedure and fluoro spot radiograph confirms appropriate catheter position. The catheter was flushed, secured to the skin, and covered with a sterile dressing. IMPRESSION: Successful placement of a right arm PICC with sonographic and fluoroscopic guidance. The catheter is ready for use. Electronically Signed   By: Richarda Overlie M.D.   On: 01/31/2016 12:30   Dg Chest Port 1 View  01/29/2016  CLINICAL DATA:  Shortness of breath EXAM: PORTABLE CHEST 1 VIEW COMPARISON:  CTA chest dated 03/31/2015 FINDINGS: Patient is rotated and her chin obscures the right lung apex. Lungs are clear.  No pleural effusion or pneumothorax. Heart is normal in size. IMPRESSION: No evidence of acute cardiopulmonary disease. Electronically Signed   By: Charline Bills M.D.   On: 01/29/2016 19:00   Ir Fluoro Guide Cv Midline Picc Right  01/31/2016  INDICATION: 55 year old with left lower extremity DVT. Poor venous access and patient needs CT examination and IV access. EXAM: RIGHT ARM PICC LINE PLACEMENT WITH ULTRASOUND AND FLUOROSCOPIC GUIDANCE MEDICATIONS: None ANESTHESIA/SEDATION: None FLUOROSCOPY TIME:  Fluoroscopy Time: 18 seconds, 0.2 mGy COMPLICATIONS: None immediate. PROCEDURE: The patient was advised of the possible risks and complications and agreed to undergo the procedure. The patient was then  brought to the angiographic suite for the procedure. The right arm was prepped with chlorhexidine, draped in the usual sterile fashion using maximum barrier technique (cap and mask, sterile gown, sterile gloves, large sterile sheet, hand hygiene and cutaneous antiseptic). Local anesthesia was attained by infiltration with 1% lidocaine. Ultrasound demonstrated patency of the right brachial vein, and this was documented with an image. Under real-time ultrasound guidance, this vein was accessed with a 21 gauge micropuncture needle and image documentation was performed. The needle was exchanged over a guidewire for a peel-away sheath through which a 32 cm 5 [place] dual lumen power injectable PICC was advanced, and positioned with its tip at the lower SVC/right atrial junction. Fluoroscopy during the procedure and fluoro spot radiograph confirms appropriate catheter position. The catheter was flushed, secured to the skin, and covered with a sterile dressing. IMPRESSION: Successful placement of a right arm PICC with sonographic and fluoroscopic guidance. The catheter is ready for use. Electronically Signed   By: Richarda Overlie M.D.   On: 01/31/2016 12:30   Ct Venogram Abd/pel  01/31/2016  CLINICAL DATA:  55 year old with a left  lower extremity DVT and complains of left leg pain and swelling. Patient is being evaluated for possible catheter directed thrombolytic therapy. Recent venous duplex examination could not evaluate the iliac venous system. Evaluate for pelvic DVT and May-Thurner syndrome. EXAM: CTA ABDOMEN AND PELVIS WITH CONTRAST (CT VENOGRAM) TECHNIQUE: Multidetector CT imaging of the abdomen and pelvis was performed using the standard protocol during bolus administration of intravenous contrast. Multiplanar reconstructed images and MIPs were obtained and reviewed to evaluate the vascular anatomy. CONTRAST:  ISOVUE-300 IOPAMIDOL (ISOVUE-300) INJECTION 61% COMPARISON:  01/09/2016 FINDINGS: Venous structures:  Hepatic veins, IVC and bilateral renal veins are patent. There is a small amount of thrombus in the distal IVC extending from the left common iliac vein. 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 There is thrombus in the left common and left external iliac vein. There is edema throughout the left thigh and evidence for filling defects in the left common femoral vein and left femoral vein. Right femoral veins and right iliac veins are patent without distension. Portal venous system is patent. Arterial structures: Atherosclerotic disease in the aorta and iliac arteries. There is at least mild narrowing in the right common iliac artery. Circumferential calcifications in the superficial femoral arteries bilaterally. Lower chest: There is a 2.0 cm nodular pleural-based density along the lateral left lower lobe on sequence 3, image 17. Previously, there were pleural-based densities in this area but this area has clearly enlarged from 01/09/2016. The pleural-based structure has fluid density and could represent a small amount of loculated fluid. Otherwise, the lung bases are clear. Nonvascular structures: Again noted is a 1.9 cm low-density structure along the hepatic dome. This could be related to a cyst. Low density throughout the liver parenchyma is suggestive for hepatic steatosis. The distal common bile duct is enlarged measuring up to 1.9 cm and previously measured 1.4 cm. There is no significant inflammation. No significant enlargement of the gallbladder. Dilatation of the main pancreatic duct without inflammatory changes. Normal appearance of spleen without enlargement. Normal appearance of both adrenal glands. No acute abnormality to either kidney. Again noted is a small cortical calcification involving the right kidney lower pole. No acute abnormality to the stomach, small bowel or colon. There is some  distension of the rectum. Normal appearance of the appendix. There appears to be a small uterus but poorly characterized. Small amount of fluid and edema in the abdomen. Extensive edema throughout the lower abdominal and pelvic subcutaneous tissues. Subcutaneous edema in the left upper extremity. Again noted is soft tissue fullness in the perineum. No acute bone abnormality. Review of the MIP images confirms the above findings. IMPRESSION: Left lower extremity deep vein thrombosis extends into the left iliac veins with a small amount of clot extending into the distal IVC. In addition, there is compression of the left common iliac vein from the right common iliac artery. 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 Extensive subcutaneous edema throughout the left lower extremity and pelvis. Increased distension of the common bile duct of uncertain etiology. This structure has been dilated in the past but appears to be enlarging. Patient does have elevated liver enzymes. Recommend GI consultation and consider further evaluation with MRCP. Pancreatic duct is also dilated. Hepatic steatosis. 2 cm pleural-based density in the left lower lobe may be related to fluid but this has increased since an exam on 01/09/2016. Recommend a  6-8 week follow-up chest CT to ensure resolution. Electronically Signed   By: Richarda Overlie M.D.   On: 01/31/2016 16:13   Ir Infusion Thrombol Venous Initial (ms)  01/31/2016  INDICATION: Symptomatic left lower extremity deep vein thrombosis. 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 Plan for catheter directed thrombolytic therapy. EXAM: LEFT LOWER EXTREMITY VENOGRAM; INITIATION OF CATHETER DIRECTED THROMBOLYSIS IN THE LEFT LOWER EXTREMITY AND PELVIS; ULTRASOUND GUIDANCE FOR VASCULAR ACCESS COMPARISON:  CT venogram 01/31/2016 MEDICATIONS: 25 mg Benadryl IV. The patient was  pre-medicated with Benadryl and prednisone for Iohexol allergy. Patient tolerated the recent CT venogram without symptoms. ANESTHESIA/SEDATION: Versed 0.5 mg IV; Fentanyl 50 mcg IV Moderate Sedation Time:  11 minutes The patient was continuously monitored during the procedure by the interventional radiology nurse under my direct supervision. FLUOROSCOPY TIME:  Fluoroscopy Time: 4 minutes 36 seconds CONTRAST:  20 mL Omnipaque 300 COMPLICATIONS: None immediate. TECHNIQUE: Informed written consent was obtained from the patient after a thorough discussion of the procedural risks, benefits and alternatives. All questions were addressed. Maximal Sterile Barrier Technique was utilized including caps, mask, sterile gowns, sterile gloves, sterile drape, hand hygiene and skin antiseptic. A timeout was performed prior to the initiation of the procedure. Patient was placed prone. The left popliteal region was prepped and draped in a sterile fashion. Skin was anesthetized with 1% lidocaine. 21 gauge needle directed into the occluded popliteal vein with ultrasound guidance. A micropuncture dilator set was placed. Small amount of contrast was injected to confirm placement in the vein. A Bentson wire was placed and a 6 [place] vascular sheath was placed. A 100 cm, 5 French catheter was easily advanced into the left common iliac vein. Small amount of contrast was injected to confirm thrombus throughout the left femoral vein and left iliac veins. Collateral vessels were also identified. Glidewire was used to cannulate the IVC and catheter was advanced into the lower IVC. IVC venogram was performed. The 5 French catheter was removed and a 90 cm (50 cm infusion length) catheter was advanced over the Bentson wire. Tip of the catheter was placed in the distal IVC. Distal aspect of the catheter is in the distal femoral vein. Catheter directed thrombolysis was started at 0.4 mg/hr of tenecteplase. The sheath was sutured to the skin. Bandage  placed over the sheath and infusion catheter. FINDINGS: Thrombosis of the left popliteal vein by ultrasound. Left lower extremity venogram confirmed thrombus throughout the left femoral vein and left iliac veins. The IVC is patent. Infusion catheter placed in the left femoral and left iliac veins. IMPRESSION: Initiation of catheter directed thrombolysis in the left lower extremity and pelvis. Plan for catheter directed thrombolysis overnight. Patient will return to Interventional Radiology on 02/01/2016 for follow-up venogram with possible balloon angioplasty and stent placement in the left common iliac vein. Electronically Signed   By: Richarda Overlie M.D.   On: 01/31/2016 17:33    Labs:  CBC:  Recent Labs  01/29/16 1252 01/30/16 0429 01/31/16 0858 02/01/16 0400  WBC 18.3* 15.0* 9.5 10.5  HGB 11.5* 8.8* 8.5* 7.8*  HCT 34.7* 26.7* 26.1* 24.2*  PLT 121* 116* 151 139*    COAGS:  Recent Labs  03/01/15 0615 03/28/15 2334 03/29/15 0246 01/29/16 1252 01/31/16 0541  INR 1.34  --  1.47 1.27 1.31  APTT 30 36 40*  --   --     BMP:  Recent Labs  01/29/16  1252 01/30/16 0429 01/31/16 0828 02/01/16 0400  NA 132* 134* 132* 136  K 4.5 3.9 4.8 4.1  CL 95* 103 104 106  CO2 18* 21* 16* 20*  GLUCOSE 125* 100* 107* 157*  BUN 13 14 11 8   CALCIUM 7.7* 6.9* 7.4* 7.4*  CREATININE 1.34* 1.12* 0.84 0.75  GFRNONAA 43* 53* >60 >60  GFRAA 50* >60 >60 >60    LIVER FUNCTION TESTS:  Recent Labs  01/29/16 1252 01/30/16 0429 01/31/16 0828 02/01/16 0400  BILITOT 1.3* 1.0 1.0 1.3*  AST 478* 410* 172* 89*  ALT 73* 69* 60* 49  ALKPHOS 258* 184* 190* 157*  PROT 5.9* 4.7* 5.0* 4.7*  ALBUMIN 2.1* 1.6* 1.7* 1.6*    Assessment and Plan:  Acute extensive DVT left leg.  Continue lysis today and plan for recheck tomorrow  Liquid diet only and NPO after midnight  Electronically Signed: Cordale Manera S Takara Sermons PA-C 02/01/2016, 11:48 AM   I spent a total of 15 Minutes at the the patient's bedside AND  on the patient's hospital floor or unit, greater than 50% of which was counseling/coordinating care for DVT lysis.

## 2016-02-01 NOTE — Progress Notes (Signed)
PULMONARY / CRITICAL CARE MEDICINE   Name: [name] MRN: [id number] DOB: [birth year]    ADMISSION DATE:  01/29/2016 CONSULTATION DATE:  01/31/2016  REFERRING MD:  Waymon Amato  CHIEF COMPLAINT:  Left lower extremity pain  HISTORY OF PRESENT ILLNESS:   55 year old female with PMH as below, which includes DM, HTN, Athsma, PE on Eliquis, Hypothyroid, and GERD. She presented to the emergency department 4/30 with complaints of Left lower extremity pain associated with edema and erythema x 2 days. Upon presentation to the ER she was found to have 10/10 pain to that leg with decreased mobility. At that tie she was also having some pleuritic chest pain. . She had bedside ultrasound in ED which demonstrated occlusive DVT throughout the L lower extremity and she was started on IV heparin. 5/2 she went to IR for venous catheter placement for thrombolysis, post-procedure she was transferred to ICU for continued lytics. PCCM to assume care in ICU.  SUBJECTIVE:  IR stent/balloon delayed  VITAL SIGNS: BP 103/78 mmHg  Pulse 85  Temp(Src) 97.7 F (36.5 C) (Oral)  Resp 22  Ht 5\' 5"  (1.651 m)  Wt 111 lb 12.4 oz (50.7 kg)  BMI 18.60 kg/m2  SpO2 98%  LMP 10/01/2010  HEMODYNAMICS:    VENTILATOR SETTINGS:    INTAKE / OUTPUT: I/O last 3 completed shifts: In: 3170.8 [P.O.:540; I.V.:2110.8; IV Piggyback:520] Out: 550 [Urine:550]  PHYSICAL EXAMINATION: General:  Very thin female in NAD Neuro:  Alert, oriented, non-focal, unpleasant HEENT:  [place]/AT, PERRL, no JVD Cardiovascular:  RRR, no MRG Lungs:  Clear bilateral breath sounds Abdomen:  Soft non-tender, non-distended Musculoskeletal:  No acute deformity. Intravenous catheter LLE. Decreased sensation L foot which is consistent with pre-op presentation , but movement increasing. l popliteal catheter Skin:  Grossly intact  LABS:  BMET  Recent Labs Lab 01/30/16 0429 01/31/16 0828 02/01/16 0400  NA 134* 132* 136  K 3.9 4.8 4.1  CL 103 104  106  CO2 21* 16* 20*  BUN 14 11 8   CREATININE 1.12* 0.84 0.75  GLUCOSE 100* 107* 157*    Electrolytes  Recent Labs Lab 01/30/16 0429 01/31/16 0828 02/01/16 0400  CALCIUM 6.9* 7.4* 7.4*    CBC  Recent Labs Lab 01/30/16 0429 01/31/16 0858 02/01/16 0400  WBC 15.0* 9.5 10.5  HGB 8.8* 8.5* 7.8*  HCT 26.7* 26.1* 24.2*  PLT 116* 151 139*    Coag's  Recent Labs Lab 01/29/16 1252 01/31/16 0541  INR 1.27 1.31    Sepsis Markers No results for input(s): LATICACIDVEN, PROCALCITON, O2SATVEN in the last 168 hours.  ABG No results for input(s): PHART, PCO2ART, PO2ART in the last 168 hours.  Liver Enzymes  Recent Labs Lab 01/30/16 0429 01/31/16 0828 02/01/16 0400  AST 410* 172* 89*  ALT 69* 60* 49  ALKPHOS 184* 190* 157*  BILITOT 1.0 1.0 1.3*  ALBUMIN 1.6* 1.7* 1.6*    Cardiac Enzymes No results for input(s): TROPONINI, PROBNP in the last 168 hours.  Glucose  Recent Labs Lab 01/30/16 2150 01/31/16 1655 02/01/16 0003 02/01/16 0830  GLUCAP 115* 119* 223* 161*    Imaging Ir US Guide Vasc Access Left  01/31/2016  INDICATION: Symptomatic left lower extremity deep vein thrombosis. 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 Plan for catheter directed thrombolytic therapy. EXAM: LEFT LOWER EXTREMITY VENOGRAM; INITIATION OF CATHETER DIRECTED THROMBOLYSIS IN THE LEFT LOWER EXTREMITY AND PELVIS; ULTRASOUND GUIDANCE FOR  VASCULAR ACCESS COMPARISON:  CT venogram 01/31/2016 MEDICATIONS: 25 mg Benadryl IV. The patient was pre-medicated with Benadryl and prednisone for Iohexol allergy. Patient tolerated the recent CT venogram without symptoms. ANESTHESIA/SEDATION: Versed 0.5 mg IV; Fentanyl 50 mcg IV Moderate Sedation Time:  11 minutes The patient was continuously monitored during the procedure by the interventional radiology nurse under my direct supervision. FLUOROSCOPY TIME:  Fluoroscopy  Time: 4 minutes 36 seconds CONTRAST:  20 mL Omnipaque 300 COMPLICATIONS: None immediate. TECHNIQUE: Informed written consent was obtained from the patient after a thorough discussion of the procedural risks, benefits and alternatives. All questions were addressed. Maximal Sterile Barrier Technique was utilized including caps, mask, sterile gowns, sterile gloves, sterile drape, hand hygiene and skin antiseptic. A timeout was performed prior to the initiation of the procedure. Patient was placed prone. The left popliteal region was prepped and draped in a sterile fashion. Skin was anesthetized with 1% lidocaine. 21 gauge needle directed into the occluded popliteal vein with ultrasound guidance. A micropuncture dilator set was placed. Small amount of contrast was injected to confirm placement in the vein. A Bentson wire was placed and a 6 [place] vascular sheath was placed. A 100 cm, 5 French catheter was easily advanced into the left common iliac vein. Small amount of contrast was injected to confirm thrombus throughout the left femoral vein and left iliac veins. Collateral vessels were also identified. Glidewire was used to cannulate the IVC and catheter was advanced into the lower IVC. IVC venogram was performed. The 5 French catheter was removed and a 90 cm (50 cm infusion length) catheter was advanced over the Bentson wire. Tip of the catheter was placed in the distal IVC. Distal aspect of the catheter is in the distal femoral vein. Catheter directed thrombolysis was started at 0.4 mg/hr of tenecteplase. The sheath was sutured to the skin. Bandage placed over the sheath and infusion catheter. FINDINGS: Thrombosis of the left popliteal vein by ultrasound. Left lower extremity venogram confirmed thrombus throughout the left femoral vein and left iliac veins. The IVC is patent. Infusion catheter placed in the left femoral and left iliac veins. IMPRESSION: Initiation of catheter directed thrombolysis in the left lower  extremity and pelvis. Plan for catheter directed thrombolysis overnight. Patient will return to Interventional Radiology on 02/01/2016 for follow-up venogram with possible balloon angioplasty and stent placement in the left common iliac vein. Electronically Signed   By: Richarda Overlie M.D.   On: 01/31/2016 17:33   Ir US Guide Vasc Access Right  01/31/2016  INDICATION: 55 year old with left lower extremity DVT. Poor venous access and patient needs CT examination and IV access. EXAM: RIGHT ARM PICC LINE PLACEMENT WITH ULTRASOUND AND FLUOROSCOPIC GUIDANCE MEDICATIONS: None ANESTHESIA/SEDATION: None FLUOROSCOPY TIME:  Fluoroscopy Time: 18 seconds, 0.2 mGy COMPLICATIONS: None immediate. PROCEDURE: The patient was advised of the possible risks and complications and agreed to undergo the procedure. The patient was then brought to the angiographic suite for the procedure. The right arm was prepped with chlorhexidine, draped in the usual sterile fashion using maximum barrier technique (cap and mask, sterile gown, sterile gloves, large sterile sheet, hand hygiene and cutaneous antiseptic). Local anesthesia was attained by infiltration with 1% lidocaine. Ultrasound demonstrated patency of the right brachial vein, and this was documented with an image. Under real-time ultrasound guidance, this vein was accessed with a 21 gauge micropuncture needle and image documentation was performed. The needle was exchanged over a guidewire for a peel-away sheath through which a 32 cm 5  [place] dual lumen power injectable PICC was advanced, and positioned with its tip at the lower SVC/right atrial junction. Fluoroscopy during the procedure and fluoro spot radiograph confirms appropriate catheter position. The catheter was flushed, secured to the skin, and covered with a sterile dressing. IMPRESSION: Successful placement of a right arm PICC with sonographic and fluoroscopic guidance. The catheter is ready for use. Electronically Signed   By:  Richarda Overlie M.D.   On: 01/31/2016 12:30   Ir Fluoro Guide Cv Midline Picc Right  01/31/2016  INDICATION: 55 year old with left lower extremity DVT. Poor venous access and patient needs CT examination and IV access. EXAM: RIGHT ARM PICC LINE PLACEMENT WITH ULTRASOUND AND FLUOROSCOPIC GUIDANCE MEDICATIONS: None ANESTHESIA/SEDATION: None FLUOROSCOPY TIME:  Fluoroscopy Time: 18 seconds, 0.2 mGy COMPLICATIONS: None immediate. PROCEDURE: The patient was advised of the possible risks and complications and agreed to undergo the procedure. The patient was then brought to the angiographic suite for the procedure. The right arm was prepped with chlorhexidine, draped in the usual sterile fashion using maximum barrier technique (cap and mask, sterile gown, sterile gloves, large sterile sheet, hand hygiene and cutaneous antiseptic). Local anesthesia was attained by infiltration with 1% lidocaine. Ultrasound demonstrated patency of the right brachial vein, and this was documented with an image. Under real-time ultrasound guidance, this vein was accessed with a 21 gauge micropuncture needle and image documentation was performed. The needle was exchanged over a guidewire for a peel-away sheath through which a 32 cm 5 [place] dual lumen power injectable PICC was advanced, and positioned with its tip at the lower SVC/right atrial junction. Fluoroscopy during the procedure and fluoro spot radiograph confirms appropriate catheter position. The catheter was flushed, secured to the skin, and covered with a sterile dressing. IMPRESSION: Successful placement of a right arm PICC with sonographic and fluoroscopic guidance. The catheter is ready for use. Electronically Signed   By: Richarda Overlie M.D.   On: 01/31/2016 12:30   Ct Venogram Abd/pel  01/31/2016  CLINICAL DATA:  56 year old with a left lower extremity DVT and complains of left leg pain and swelling. Patient is being evaluated for possible catheter directed thrombolytic therapy.  Recent venous duplex examination could not evaluate the iliac venous system. Evaluate for pelvic DVT and May-Thurner syndrome. EXAM: CTA ABDOMEN AND PELVIS WITH CONTRAST (CT VENOGRAM) TECHNIQUE: Multidetector CT imaging of the abdomen and pelvis was performed using the standard protocol during bolus administration of intravenous contrast. Multiplanar reconstructed images and MIPs were obtained and reviewed to evaluate the vascular anatomy. CONTRAST:  ISOVUE-300 IOPAMIDOL (ISOVUE-300) INJECTION 61% COMPARISON:  01/09/2016 FINDINGS: Venous structures: Hepatic veins, IVC and bilateral renal veins are patent. There is a small amount of thrombus in the distal IVC extending from the left common iliac vein. 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 There is thrombus in the left common and left external iliac vein. There is edema throughout the left thigh and evidence for filling defects in the left common femoral vein and left femoral vein. Right femoral veins and right iliac veins are patent without distension. Portal venous system is patent. Arterial structures: Atherosclerotic disease in the aorta and iliac arteries. There is at least mild narrowing in the right common iliac artery. Circumferential calcifications in the superficial femoral arteries bilaterally. Lower chest: There is a 2.0 cm nodular pleural-based density along the lateral left lower lobe on sequence 3, image 17. Previously,  there were pleural-based densities in this area but this area has clearly enlarged from 01/09/2016. The pleural-based structure has fluid density and could represent a small amount of loculated fluid. Otherwise, the lung bases are clear. Nonvascular structures: Again noted is a 1.9 cm low-density structure along the hepatic dome. This could be related to a cyst. Low density throughout the liver parenchyma is suggestive for  hepatic steatosis. The distal common bile duct is enlarged measuring up to 1.9 cm and previously measured 1.4 cm. There is no significant inflammation. No significant enlargement of the gallbladder. Dilatation of the main pancreatic duct without inflammatory changes. Normal appearance of spleen without enlargement. Normal appearance of both adrenal glands. No acute abnormality to either kidney. Again noted is a small cortical calcification involving the right kidney lower pole. No acute abnormality to the stomach, small bowel or colon. There is some distension of the rectum. Normal appearance of the appendix. There appears to be a small uterus but poorly characterized. Small amount of fluid and edema in the abdomen. Extensive edema throughout the lower abdominal and pelvic subcutaneous tissues. Subcutaneous edema in the left upper extremity. Again noted is soft tissue fullness in the perineum. No acute bone abnormality. Review of the MIP images confirms the above findings. IMPRESSION: Left lower extremity deep vein thrombosis extends into the left iliac veins with a small amount of clot extending into the distal IVC. In addition, there is compression of the left common iliac vein from the right common iliac artery. 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 Extensive subcutaneous edema throughout the left lower extremity and pelvis. Increased distension of the common bile duct of uncertain etiology. This structure has been dilated in the past but appears to be enlarging. Patient does have elevated liver enzymes. Recommend GI consultation and consider further evaluation with MRCP. Pancreatic duct is also dilated. Hepatic steatosis. 2 cm pleural-based density in the left lower lobe may be related to fluid but this has increased since an exam on 01/09/2016. Recommend a 6-8 week follow-up chest CT to ensure resolution. Electronically Signed   By: Richarda Overlie M.D.   On:  01/31/2016 16:13   Ir Infusion Thrombol Venous Initial (ms)  01/31/2016  INDICATION: Symptomatic left lower extremity deep vein thrombosis. 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 Plan for catheter directed thrombolytic therapy. EXAM: LEFT LOWER EXTREMITY VENOGRAM; INITIATION OF CATHETER DIRECTED THROMBOLYSIS IN THE LEFT LOWER EXTREMITY AND PELVIS; ULTRASOUND GUIDANCE FOR VASCULAR ACCESS COMPARISON:  CT venogram 01/31/2016 MEDICATIONS: 25 mg Benadryl IV. The patient was pre-medicated with Benadryl and prednisone for Iohexol allergy. Patient tolerated the recent CT venogram without symptoms. ANESTHESIA/SEDATION: Versed 0.5 mg IV; Fentanyl 50 mcg IV Moderate Sedation Time:  11 minutes The patient was continuously monitored during the procedure by the interventional radiology nurse under my direct supervision. FLUOROSCOPY TIME:  Fluoroscopy Time: 4 minutes 36 seconds CONTRAST:  20 mL Omnipaque 300 COMPLICATIONS: None immediate. TECHNIQUE: Informed written consent was obtained from the patient after a thorough discussion of the procedural risks, benefits and alternatives. All questions were addressed. Maximal Sterile Barrier Technique was utilized including caps, mask, sterile gowns, sterile gloves, sterile drape, hand hygiene and skin antiseptic. A timeout was performed prior to the initiation of the procedure. Patient was placed prone. The left popliteal region was prepped and draped in a sterile fashion. Skin was anesthetized with 1% lidocaine. 21 gauge needle  directed into the occluded popliteal vein with ultrasound guidance. A micropuncture dilator set was placed. Small amount of contrast was injected to confirm placement in the vein. A Bentson wire was placed and a 6 [place] vascular sheath was placed. A 100 cm, 5 French catheter was easily advanced into the left common iliac vein. Small amount of contrast was injected to  confirm thrombus throughout the left femoral vein and left iliac veins. Collateral vessels were also identified. Glidewire was used to cannulate the IVC and catheter was advanced into the lower IVC. IVC venogram was performed. The 5 French catheter was removed and a 90 cm (50 cm infusion length) catheter was advanced over the Bentson wire. Tip of the catheter was placed in the distal IVC. Distal aspect of the catheter is in the distal femoral vein. Catheter directed thrombolysis was started at 0.4 mg/hr of tenecteplase. The sheath was sutured to the skin. Bandage placed over the sheath and infusion catheter. FINDINGS: Thrombosis of the left popliteal vein by ultrasound. Left lower extremity venogram confirmed thrombus throughout the left femoral vein and left iliac veins. The IVC is patent. Infusion catheter placed in the left femoral and left iliac veins. IMPRESSION: Initiation of catheter directed thrombolysis in the left lower extremity and pelvis. Plan for catheter directed thrombolysis overnight. Patient will return to Interventional Radiology on 02/01/2016 for follow-up venogram with possible balloon angioplasty and stent placement in the left common iliac vein. Electronically Signed   By: Richarda Overlie M.D.   On: 01/31/2016 17:33     STUDIES:  CT venogram 5/2 > Left lower extremity deep vein thrombosis extends into the left iliac veins with a small amount of clot extending into the distal IVC. In addition, there is compression of the left common iliac vein from the right common iliac artery. 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 Increased distension of the common bile duct of uncertain etiology. This structure has been dilated in the past but appears to be enlarging. Patient does have elevated liver enzymes. Recommend GI consultation and consider further evaluation with MRCP. Pancreatic duct is also dilated. Hepatic steatosis. 2 cm pleural-based density in  the left lower lobe may be related to fluid but this has increased since an exam on 01/09/2016. Recommend a 6-8 week follow-up chest CT to ensure resolution.  CULTURES:   ANTIBIOTICS:   SIGNIFICANT EVENTS: 4/30 admit 5/2 IR thrombolysis  LINES/TUBES: 5/2 RUE PICC 5/2 popliteal sheath>>  DISCUSSION: 55 year old female with multiple medical problems admitted with extensive LLE DVT. 5/2 underwent thrombolysis with indwelling IR catheter. POst-procedure transferred to ICU for continued TNK infusion.  ASSESSMENT / PLAN:  PULMONARY A: Dyspnea > resolved - there was some concern for PE. Contrast allergy and unable to tolerate VQ. No R heart strain on Echo.  H/o asthma without acute exacerbation  P:   Supplemental O2 if indicated to keep SpO2 > 92% Anticoagulated with IV heprain  CARDIOVASCULAR A:  HTN Normal RV fxn P:  Telemetry monitoring Holding outpatient anti-hypertension  RENAL A:   NAG metabolic acidosis, appears chronic  P:   Follow BMP  GASTROINTESTINAL A:   Elevated transaminases (improving) Hepatic steatosis  Hx pancreatitis , Chron's.   P:   Heart healthy diet Protonix  HEMATOLOGIC A:   Extensive LLE DVT Acute anemia - concern GIB as history of this, but HGB has been stable.   P:  Per IR Now on intravenous TNK via IR placed catheter.  IV  heparin Follow CBC closely as is now on TNK Holding home apixaban Prednisone per IR Was to go to IR 5/3 but ate breakfast so delayed  INFECTIOUS A:   No acute issues  P:   Follow WBC and fever curve No ABX  ENDOCRINE A:   DM Hypothyroid   P:   Continue home synthroid  NEUROLOGIC A:   No acute issues  P:   monitor   FAMILY  - Updates: no family at bedside.   - Inter-disciplinary family meet or Palliative Care meeting due by:  5/2   Brett Canales Minor ACNP [facility] PCCM Pager [phone] till 3 pm If no answer page [phone] 02/01/2016, 11:39 AM  STAFF NOTE: I, Rory Percy, MD FACP  have personally reviewed patient's available data, including medical history, events of note, physical examination and test results as part of my evaluation. I have discussed with resident/NP and other care providers such as pharmacist, RN and RRT. In addition, I personally evaluated patient and elicited key findings of: no distress, leg swollen, no cp, no sob, continued TNK, heparin, echo RV reassuring, to go back in am to IR, will discuss role Pred?, get cbc in am , will need cancer screen, homocysteine, ana, facto 5 leiden Remain in icu  [employer]. Tyson Alias, MD, FACP Pgr: [phone] [facility] 02/01/2016 12:51 PM

## 2016-02-01 NOTE — Procedures (Signed)
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 Treated w/ a 14 x 60 mm stent No comp Stable EBL 0 Full report in PACS

## 2016-02-01 NOTE — Progress Notes (Signed)
Received hand-off report from [facility]. Upon assessing the patient's left pedal pulse the patient became verbally aggressive and would not let Kaylyne Axton RN assess the pedal pulse. Note: the left leg contains the sheath. The patient would not let the RN view the PICC that was bleeding either. Will notify MD.

## 2016-02-01 NOTE — Sedation Documentation (Signed)
Patient is resting comfortably. 

## 2016-02-02 ENCOUNTER — Other Ambulatory Visit: Payer: Self-pay | Admitting: Radiology

## 2016-02-02 DIAGNOSIS — I82409 Acute embolism and thrombosis of unspecified deep veins of unspecified lower extremity: Secondary | ICD-10-CM

## 2016-02-02 LAB — CBC
HCT: 28 % — ABNORMAL LOW (ref 36.0–46.0)
HEMATOCRIT: 21.2 % — AB (ref 36.0–46.0)
Hemoglobin: 6.8 g/dL — CL (ref 12.0–15.0)
Hemoglobin: 9.1 g/dL — ABNORMAL LOW (ref 12.0–15.0)
MCH: 31.8 pg (ref 26.0–34.0)
MCH: 30.4 pg (ref 26.0–34.0)
MCHC: 32.1 g/dL (ref 30.0–36.0)
MCHC: 32.5 g/dL (ref 30.0–36.0)
MCV: 99.1 fL (ref 78.0–100.0)
MCV: 93.6 fL (ref 78.0–100.0)
PLATELETS: 127 10*3/uL — AB (ref 150–400)
PLATELETS: 121 10*3/uL — AB (ref 150–400)
RBC: 2.14 MIL/uL — ABNORMAL LOW (ref 3.87–5.11)
RBC: 2.99 MIL/uL — ABNORMAL LOW (ref 3.87–5.11)
RDW: 17.2 % — AB (ref 11.5–15.5)
RDW: 21.5 % — AB (ref 11.5–15.5)
WBC: 7.6 10*3/uL (ref 4.0–10.5)
WBC: 11.4 10*3/uL — AB (ref 4.0–10.5)

## 2016-02-02 LAB — BASIC METABOLIC PANEL
ANION GAP: 7 (ref 5–15)
BUN: 8 mg/dL (ref 6–20)
CALCIUM: 7.3 mg/dL — AB (ref 8.9–10.3)
CO2: 21 mmol/L — AB (ref 22–32)
Chloride: 107 mmol/L (ref 101–111)
Creatinine, Ser: 0.68 mg/dL (ref 0.44–1.00)
GFR calc Af Amer: >60 mL/min (ref >60)
GLUCOSE: 135 mg/dL — AB (ref 65–99)
Potassium: 3.7 mmol/L (ref 3.5–5.1)
Sodium: 135 mmol/L (ref 135–145)

## 2016-02-02 LAB — PROTIME-INR
INR: 1.25 (ref 0.00–1.49)
Prothrombin Time: 15.8 seconds — ABNORMAL HIGH (ref 11.6–15.2)

## 2016-02-02 LAB — HEPARIN LEVEL (UNFRACTIONATED)
HEPARIN UNFRACTIONATED: 0.55 [IU]/mL (ref 0.30–0.70)
HEPARIN UNFRACTIONATED: 0.37 [IU]/mL (ref 0.30–0.70)
HEPARIN UNFRACTIONATED: 0.3 [IU]/mL (ref 0.30–0.70)

## 2016-02-02 LAB — PREPARE RBC (CROSSMATCH)

## 2016-02-02 LAB — HOMOCYSTEINE: Homocysteine: 8.3 umol/L (ref 0.0–15.0)

## 2016-02-02 LAB — GLUCOSE, CAPILLARY: Glucose-Capillary: 147 mg/dL — ABNORMAL HIGH (ref 65–99)

## 2016-02-02 LAB — HEMOGLOBIN AND HEMATOCRIT, BLOOD
HCT: 26 % — ABNORMAL LOW (ref 36.0–46.0)
Hemoglobin: 8.5 g/dL — ABNORMAL LOW (ref 12.0–15.0)

## 2016-02-02 LAB — ANTINUCLEAR ANTIBODIES, IFA: ANA Ab, IFA: NEGATIVE

## 2016-02-02 LAB — APTT: APTT: 126 s — AB (ref 24–37)

## 2016-02-02 MED ORDER — SODIUM CHLORIDE 0.9 % IV SOLN: Freq: Once | INTRAVENOUS | Status: DC

## 2016-02-02 MED ORDER — DIPHENHYDRAMINE HCL 25 MG PO CAPS
25.0000 mg | ORAL_CAPSULE | Freq: Four times a day (QID) | ORAL | Status: DC | PRN
  Administered 2016-02-02 – 2016-02-06 (×6): 25 mg via ORAL
  Filled 2016-02-02 (×7): qty 1

## 2016-02-02 NOTE — Progress Notes (Signed)
ANTICOAGULATION CONSULT NOTE  Pharmacy Consult:  Heparin Indication:   New LLE DVT  Allergies  Allergen Reactions  . Iohexol Other (See Comments)    "severe burning" Patient has received Contrast in 2005 with 13 hour pre-medication, and had no reaction at that time  . Spiriva Handihaler [Tiotropium Bromide Monohydrate] Nausea And Vomiting  . Ativan [Lorazepam] Other (See Comments)    hallucinations  . Penicillins Hives   Patient Measurements: Height: 5\' 5"  (165.1 cm) Weight: 111 lb 12.4 oz (50.7 kg) IBW/kg (Calculated) : 57 Heparin Dosing Weight: 50 kg  Vital Signs: Temp: 98.5 F (36.9 C) (05/04 1126) Temp Source: Oral (05/04 1126) BP: 114/78 mmHg (05/04 1007) Pulse Rate: 61 (05/04 1300)  Labs:  Recent Labs  01/31/16 0541 01/31/16 0828  01/31/16 0858 01/31/16 1430  02/01/16 0400 02/02/16 02/02/16 0518 02/02/16 1205  HGB  --   --   < > 8.5*  --   --  7.8*  --  6.8* 8.5*  HCT  --   --   < > 26.1*  --   --  24.2*  --  21.2* 26.0*  PLT  --   --   --  151  --   --  139*  --  127*  --   APTT  --   --   --   --   --   --   --   --  126*  --   LABPROT 16.4*  --   --   --   --   --   --   --  15.8*  --   INR 1.31  --   --   --   --   --   --   --  1.25  --   HEPARINUNFRC 0.56  --   --   --   --   < > 0.35 0.30 0.55 0.37  CREATININE  --  0.84  --   --   --   --  0.75  --  0.68  --   CKTOTAL  --   --   --   --  116  --   --   --   --   --   < > = values in this interval not displayed. Estimated Creatinine Clearance: 62.1 mL/min (by C-G formula based on Cr of 0.68).  Assessment: 3 YOF with new LLE DVT to continue on IV heparin.  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 S/p IR catheter placement for thrombolysis, she is s/p catheter-directed TNKase (ended 5/3 ~1700). Pt therapeutic x2 on heparin. Hgb down to 6.8, rec'd prbc x1 this morning, hgb up to 8.5, plts low stable.   Goal of Therapy:  Heparin level 0.3-0.7 units/ml Monitor platelets by  anticoagulation protocol: Yes   Plan:  - Continue heparin gtt at 700 units/hr  - F/u s/s bleeding and Hgb closely - F/u plans for PO anticoagulation, appropriate to transition anytime now   Agapito Games, PharmD, BCPS Clinical Pharmacist Pager: [phone] 02/02/2016 2:56 PM

## 2016-02-02 NOTE — Progress Notes (Signed)
ANTICOAGULATION CONSULT NOTE  - FOLLOW UP  Pharmacy Consult:  Heparin Indication:   New LLE DVT  Allergies  Allergen Reactions  . Iohexol Other (See Comments)    "severe burning" Patient has received Contrast in 2005 with 13 hour pre-medication, and had no reaction at that time  . Spiriva Handihaler [Tiotropium Bromide Monohydrate] Nausea And Vomiting  . Ativan [Lorazepam] Other (See Comments)    hallucinations  . Penicillins Hives   Patient Measurements: Height: 5\' 5"  (165.1 cm) Weight: 111 lb 12.4 oz (50.7 kg) IBW/kg (Calculated) : 57 Heparin Dosing Weight: 50 kg  Vital Signs: Temp: 98.5 F (36.9 C) (05/03 2354) Temp Source: Oral (05/03 2354) BP: 108/80 mmHg (05/03 2000) Pulse Rate: 97 (05/03 1700)  Labs:  Recent Labs  01/30/16 0429  01/31/16 0541 01/31/16 0828 01/31/16 0858 01/31/16 1430 01/31/16 1816 02/01/16 0400 02/02/16  HGB 8.8*  --   --   --  8.5*  --   --  7.8*  --   HCT 26.7*  --   --   --  26.1*  --   --  24.2*  --   PLT 116*  --   --   --  151  --   --  139*  --   LABPROT  --   --  16.4*  --   --   --   --   --   --   INR  --   --  1.31  --   --   --   --   --   --   HEPARINUNFRC  --   < > 0.56  --   --   --  0.42 0.35 0.30  CREATININE 1.12*  --   --  0.84  --   --   --  0.75  --   CKTOTAL  --   --   --   --   --  116  --   --   --   < > = values in this interval not displayed. Estimated Creatinine Clearance: 62.1 mL/min (by C-G formula based on Cr of 0.75).    Assessment: 34 YOF with new LLE DVT to continue on IV heparin.  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 S/p IR catheter placement for thrombolysis, she is s/p catheter-directed TNKase (ended 5/3 ~1700). Pt is at low end of therapeutic on systemic heparin. Hgb down a bit to 7.8 with small amount of oozing from IV site.   Goal of Therapy:  Heparin level 0.3-0.7 units/ml Monitor platelets by anticoagulation protocol: Yes  Plan:  - Increase heparin gtt to 700 units/hr  to ensure that pt remains therapeutic - Will f/u heparin level at 0630 in a.m. (6 hours past rate increase)  Christoper Fabian, PharmD, BCPS Clinical pharmacist, pager [phone] 02/02/2016 12:27 AM

## 2016-02-02 NOTE — Progress Notes (Signed)
ANTICOAGULATION CONSULT NOTE  - FOLLOW UP  Pharmacy Consult:  Heparin Indication:   New LLE DVT  Allergies  Allergen Reactions  . Iohexol Other (See Comments)    "severe burning" Patient has received Contrast in 2005 with 13 hour pre-medication, and had no reaction at that time  . Spiriva Handihaler [Tiotropium Bromide Monohydrate] Nausea And Vomiting  . Ativan [Lorazepam] Other (See Comments)    hallucinations  . Penicillins Hives   Patient Measurements: Height: 5\' 5"  (165.1 cm) Weight: 111 lb 12.4 oz (50.7 kg) IBW/kg (Calculated) : 57 Heparin Dosing Weight: 50 kg  Vital Signs: Temp: 97.9 F (36.6 C) (05/04 0350) Temp Source: Oral (05/04 0350) BP: 106/80 mmHg (05/04 0620) Pulse Rate: 3 (05/04 0350)  Labs:  Recent Labs  01/31/16 0541 01/31/16 0828  01/31/16 0858 01/31/16 1430  02/01/16 0400 02/02/16 02/02/16 0518  HGB  --   --   < > 8.5*  --   --  7.8*  --  6.8*  HCT  --   --   --  26.1*  --   --  24.2*  --  21.2*  PLT  --   --   --  151  --   --  139*  --  127*  APTT  --   --   --   --   --   --   --   --  126*  LABPROT 16.4*  --   --   --   --   --   --   --  15.8*  INR 1.31  --   --   --   --   --   --   --  1.25  HEPARINUNFRC 0.56  --   --   --   --   < > 0.35 0.30 0.55  CREATININE  --  0.84  --   --   --   --  0.75  --  0.68  CKTOTAL  --   --   --   --  116  --   --   --   --   < > = values in this interval not displayed. Estimated Creatinine Clearance: 62.1 mL/min (by C-G formula based on Cr of 0.68).  Assessment: 1 YOF with new LLE DVT to continue on IV heparin.  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 S/p IR catheter placement for thrombolysis, she is s/p catheter-directed TNKase (ended 5/3 ~1700). Pt on therapeutic on heparin. Hgb down to 6.8, MD ordered PRBC x 1.   Goal of Therapy:  Heparin level 0.3-0.7 units/ml Monitor platelets by anticoagulation protocol: Yes  Plan:  - Continue heparin gtt at 700 units/hr  - F/u s/s  bleeding and Hgb closely  Christoper Fabian, PharmD, BCPS Clinical pharmacist, pager [phone] 02/02/2016 6:44 AM

## 2016-02-02 NOTE — Progress Notes (Addendum)
PULMONARY / CRITICAL CARE MEDICINE   Name: [name] MRN: [id number] DOB: March 14, 1961    ADMISSION DATE:  01/29/2016 CONSULTATION DATE:  01/31/2016  REFERRING MD:  Waymon Amato  CHIEF COMPLAINT:  Left lower extremity pain  HISTORY OF PRESENT ILLNESS:   55 year old female with PMH as below, which includes DM, HTN, Athsma, PE on Eliquis, Hypothyroid, and GERD. She presented to the emergency department 4/30 with complaints of Left lower extremity pain associated with edema and erythema x 2 days. Upon presentation to the ER she was found to have 10/10 pain to that leg with decreased mobility. At that time she was also having some pleuritic chest pain. . She had bedside ultrasound in ED which demonstrated occlusive DVT throughout the L lower extremity and she was started on IV heparin. 5/2 she went to IR for venous catheter placement for thrombolysis, post-procedure she was transferred to ICU for continued lytics. PCCM to assume care in ICU.  SUBJECTIVE:  Pt alert and oriented c/o pain improved post administration of prn pain medication  VITAL SIGNS: BP 114/78 mmHg  Pulse 61  Temp(Src) 98.5 F (36.9 C) (Oral)  Resp 27  Ht 5\' 5"  (1.651 m)  Wt 111 lb 12.4 oz (50.7 kg)  BMI 18.60 kg/m2  SpO2 99%  LMP 10/01/2010  HEMODYNAMICS:    VENTILATOR SETTINGS:    INTAKE / OUTPUT: I/O last 3 completed shifts: In: 2780 [P.O.:240; I.V.:1740; IV Piggyback:800] Out: 1150 [Urine:1150]  PHYSICAL EXAMINATION: General:  Very thin female in NAD Neuro:  Alert and oriented follows commands HEENT:  [place]/AT, PERRL, no JVD Cardiovascular:  RRR, no MRG Lungs:  Clear bilateral breath sounds Abdomen: BS wnl, soft non-tender, non-distended Musculoskeletal:  No acute deformity. Tegaderm and gauze present at previous venous catheter popliteal site dressing dry and intact. Decreased sensation L foot which is consistent with pre-op presentation however sensation improving extremity warm with palpation movement  increasing.  Skin:  Grossly intact  LABS:  BMET  Recent Labs Lab 01/31/16 0828 02/01/16 0400 02/02/16 0518  NA 132* 136 135  K 4.8 4.1 3.7  CL 104 106 107  CO2 16* 20* 21*  BUN 11 8 8   CREATININE 0.84 0.75 0.68  GLUCOSE 107* 157* 135*    Electrolytes  Recent Labs Lab 01/31/16 0828 02/01/16 0400 02/02/16 0518  CALCIUM 7.4* 7.4* 7.3*    CBC  Recent Labs Lab 01/31/16 0858 02/01/16 0400 02/02/16 0518 02/02/16 1205  WBC 9.5 10.5 7.6  --   HGB 8.5* 7.8* 6.8* 8.5*  HCT 26.1* 24.2* 21.2* 26.0*  PLT 151 139* 127*  --     Coag's  Recent Labs Lab 01/29/16 1252 01/31/16 0541 02/02/16 0518  APTT  --   --  126*  INR 1.27 1.31 1.25    Sepsis Markers No results for input(s): LATICACIDVEN, PROCALCITON, O2SATVEN in the last 168 hours.  ABG No results for input(s): PHART, PCO2ART, PO2ART in the last 168 hours.  Liver Enzymes  Recent Labs Lab 01/30/16 0429 01/31/16 0828 02/01/16 0400  AST 410* 172* 89*  ALT 69* 60* 49  ALKPHOS 184* 190* 157*  BILITOT 1.0 1.0 1.3*  ALBUMIN 1.6* 1.7* 1.6*    Cardiac Enzymes No results for input(s): TROPONINI, PROBNP in the last 168 hours.  Glucose  Recent Labs Lab 01/31/16 1655 02/01/16 0003 02/01/16 0830 02/01/16 1206 02/01/16 2018 02/01/16 2352  GLUCAP 119* 223* 161* 214* 180* 147*    Imaging Ir Transcath Plc Stent 1st Art Not Orion Modest  Vert Car  02/01/2016  INDICATION: Acute symptomatic left lower extremity DVT, May-Thurner syndrome, 24 hours status post thrombolysis. EXAM: IR THROMB F/U EVAL ART/VEN SUBSEQ DAY (MS); LEFT EXTREMITY VENOGRAPHY; IR TRANSCATH PLC STENT 1ST ART NOT LE CV CAR VERT CAR COMPARISON:  02/01/2016, 01/31/2016 MEDICATIONS: 1% lidocaine locally ANESTHESIA/SEDATION: Fentanyl 150 mcg IV The patient was continuously monitored during the procedure by the interventional radiology nurse under my direct supervision. FLUOROSCOPY TIME:  Fluoroscopy Time: 4 minutes 30 seconds (70 mGy).  COMPLICATIONS: None immediate. TECHNIQUE: Informed written consent was obtained from the patient after a thorough discussion of the procedural risks, benefits and alternatives. All questions were addressed. Maximal Sterile Barrier Technique was utilized including caps, mask, sterile gowns, sterile gloves, sterile drape, hand hygiene and skin antiseptic. A timeout was performed prior to the initiation of the procedure. Under sterile conditions, follow-up left lower extremity venogram performed through the existing access. This demonstrates complete lysis of the left iliofemoral DVT. No significant residual thrombus within the left lower extremity deep venous system. 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 Under sterile conditions and local anesthesia, the existing 6 French sheath was exchanged for a 7 French 50 cm sheath. The sheath was advanced into the left external iliac vein. Over an exchange guidewire, left iliac venogram was repeated. This confirms persistent occlusion of the left common iliac vein at the bifurcation. Initially, 12 mm venous angioplasty performed of the left common iliac venous occlusion. A single inflation was performed to approximately 15 atmospheres for 30 seconds. Balloon was deflated removed. Central venogram repeated. This demonstrates recanalization of the left common iliac occlusion. There is diffuse wall irregularity noted. The bifurcation can be identified. Lower IVC remains patent. Measurements obtained for the appropriate stent length and diameter. Stent placement: Under fluoroscopy, a 14 mm x 60 mm stent was deployed from the iliac bifurcation back into the left external iliac vein completely covering the common iliac occlusion. Post insertion, repeat venogram performed. This confirms successful recanalization of the left common iliac venous occlusion with  restoration of antegrade venous flow. No significant residual stenosis. No residual thrombus. Images obtained for documentation. Access removed. Hemostasis obtained with compression. FINDINGS: Imaging confirms lysis of the left lower extremity DVT with left common iliac venous occlusion treated successfully with a 14 x 60 mm self expanding stent. 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 This was successfully treated with a 14 mm x 60 mm self expanding stent insertion with restoration of antegrade flow. Electronically Signed   By: Judie Petit.  Shick M.D.   On: 02/01/2016 17:25   Ir Rande Lawman F/u Eval Art/ven Final Day (ms)  02/01/2016  INDICATION: Acute symptomatic left lower extremity DVT, May-Thurner syndrome, 24 hours status post thrombolysis. EXAM: IR THROMB F/U EVAL ART/VEN SUBSEQ DAY (MS); LEFT EXTREMITY VENOGRAPHY; IR TRANSCATH PLC STENT 1ST ART NOT LE CV CAR VERT CAR COMPARISON:  02/01/2016, 01/31/2016 MEDICATIONS: 1% lidocaine locally ANESTHESIA/SEDATION: Fentanyl 150 mcg IV The patient was continuously monitored during the procedure by the interventional radiology nurse under my direct supervision. FLUOROSCOPY TIME:  Fluoroscopy Time: 4 minutes 30 seconds (70 mGy). COMPLICATIONS: None immediate. TECHNIQUE: Informed written consent was obtained from the patient after a thorough discussion of the procedural risks, benefits and alternatives. All questions were addressed. Maximal Sterile Barrier Technique was utilized including caps, mask, sterile gowns, sterile gloves,  sterile drape, hand hygiene and skin antiseptic. A timeout was performed prior to the initiation of the procedure. Under sterile conditions, follow-up left lower extremity venogram performed through the existing access. This demonstrates complete lysis of the left iliofemoral DVT. No significant residual thrombus within the left lower extremity  deep venous system. 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 Under sterile conditions and local anesthesia, the existing 6 French sheath was exchanged for a 7 French 50 cm sheath. The sheath was advanced into the left external iliac vein. Over an exchange guidewire, left iliac venogram was repeated. This confirms persistent occlusion of the left common iliac vein at the bifurcation. Initially, 12 mm venous angioplasty performed of the left common iliac venous occlusion. A single inflation was performed to approximately 15 atmospheres for 30 seconds. Balloon was deflated removed. Central venogram repeated. This demonstrates recanalization of the left common iliac occlusion. There is diffuse wall irregularity noted. The bifurcation can be identified. Lower IVC remains patent. Measurements obtained for the appropriate stent length and diameter. Stent placement: Under fluoroscopy, a 14 mm x 60 mm stent was deployed from the iliac bifurcation back into the left external iliac vein completely covering the common iliac occlusion. Post insertion, repeat venogram performed. This confirms successful recanalization of the left common iliac venous occlusion with restoration of antegrade venous flow. No significant residual stenosis. No residual thrombus. Images obtained for documentation. Access removed. Hemostasis obtained with compression. FINDINGS: Imaging confirms lysis of the left lower extremity DVT with left common iliac venous occlusion treated successfully with a 14 x 60 mm self expanding stent. 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 This was successfully treated with a 14 mm x 60 mm self expanding stent insertion with restoration of antegrade flow. Electronically Signed   By: Judie Petit.  Shick  M.D.   On: 02/01/2016 17:25     STUDIES:  CT venogram 5/2 > Left lower extremity deep vein thrombosis extends into the left iliac veins with a small amount of clot extending into the distal IVC. In addition, there is compression of the left common iliac vein from the right common iliac artery. 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 Increased distension of the common bile duct of uncertain etiology. This structure has been dilated in the past but appears to be enlarging. Patient does have elevated liver enzymes. Recommend GI consultation and consider further evaluation with MRCP. Pancreatic duct is also dilated. Hepatic steatosis. 2 cm pleural-based density in the left lower lobe may be related to fluid but this has increased since an exam on 01/09/2016. Recommend a 6-8 week follow-up chest CT to ensure resolution.  CULTURES: MRSA PCR 5/2>>negative   ANTIBIOTICS: None   SIGNIFICANT EVENTS: 4/30 admit 5/2 IR thrombolysis  LINES/TUBES: 5/2 RUE PICC 5/2 popliteal sheath>>5/3  DISCUSSION: 55 year old female with multiple medical problems admitted with extensive LLE DVT. 5/2 underwent thrombolysis with indwelling IR catheter. POst-procedure transferred to ICU for continued TNK infusion.  ASSESSMENT / PLAN:  PULMONARY A: Dyspnea > resolved - there was some concern for PE. Contrast allergy and unable to tolerate VQ. No R heart strain on Echo.  H/o asthma without acute exacerbation  P:   Supplemental O2 if indicated to keep SpO2 > 92% Anticoagulated with IV heprain  CARDIOVASCULAR A:  HTN Normal RV  fxn P:  Holding outpatient anti-hypertension  RENAL A:   NAG metabolic acidosis, appears chronic-Improved  P:   Discontinue Sodium Bicarb tablets Follow BMP  GASTROINTESTINAL A:   Elevated transaminases (improving) Hepatic steatosis  Hx pancreatitis , Chron's.   P:   Heart healthy diet Protonix  HEMATOLOGIC A:    Extensive LLE DVT Acute anemia - concern GIB as history of this, but HGB has been stable.   P:  Per IR IV heparin TNK completed Follow CBC closely as pt remains on Heparin drip Holding home apixaban  INFECTIOUS A:   No acute issues  P:   Follow WBC and fever curve No ABX  ENDOCRINE A:   DM Hypothyroid   P:   Serum glucose stable continue to monitor Continue home synthroid  NEUROLOGIC A:   No acute issues  P:   monitor   FAMILY  - Updates: no family at bedside.   - Inter-disciplinary family meet or Palliative Care meeting due by:  5/2  Sonda Rumble, AGNP  Pulmonary/Critical Care  STAFF NOTE: I, Rory Percy, MD FACP have personally reviewed patient's available data, including medical history, events of note, physical examination and test results as part of my evaluation. I have discussed with resident/NP and other care providers such as pharmacist, RN and RRT. In addition, I personally evaluated patient and elicited key findings of: awake, no distress, leg better, catheter out, did receive 1 unit prbc, no active bleed noted, no abdo pain, cbc in pm, to med floor, await homocysteine, ana, heparin on going, consider oral agent soon, to triad, dc bicarb  Mcarthur Rossetti. Tyson Alias, MD, FACP Pgr: [phone] [facility] 02/02/2016 2:11 PM

## 2016-02-02 NOTE — Progress Notes (Signed)
eLink Physician-Brief Progress Note Patient Name: [name] DOB: [birth year] MRN: [id number]   Date of Service  02/02/2016  HPI/Events of Note  Hb=6.8  eICU Interventions  Ordered 1 unit PRBC.         Shane Crutch 02/02/2016, 6:06 AM

## 2016-02-02 NOTE — Progress Notes (Signed)
CRITICAL VALUE ALERT  Critical value received:  Hgb 6.8  Date of notification:  02/02/16  Time of notification: 0605  Critical value read back:Yes.    Called [facility] at [phone] and spoke with MD.  1 unit of PRBC to be ordered.

## 2016-02-02 NOTE — Progress Notes (Signed)
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 Pt feels ok this am, sitting up in bed eating. Some residual soreness in LLE.  Allergies: Iohexol; Spiriva handihaler; Ativan; and Penicillins  Medications:  Current facility-administered medications:  .  0.9 %  sodium chloride infusion, , Intravenous, Once, Shane Crutch, MD .  acetaminophen (TYLENOL) tablet 650 mg, 650 mg, Oral, Q6H PRN **OR** acetaminophen (TYLENOL) suppository 650 mg, 650 mg, Rectal, Q6H PRN, Jeralyn Bennett, MD .  calcium carbonate (TUMS - dosed in mg elemental calcium) chewable tablet 200 mg of elemental calcium, 1 tablet, Oral, BID WC, Jeralyn Bennett, MD, 200 mg of elemental calcium at 02/02/16 0743 .  diphenhydrAMINE (BENADRYL) capsule 25 mg, 25 mg, Oral, Q6H PRN, Nelda Bucks, MD .  gabapentin (NEURONTIN) capsule 100 mg, 100 mg, Oral, BID, Jeralyn Bennett, MD, 100 mg at 02/01/16 2156 .  heparin ADULT infusion 100 units/mL (25000 units/250 mL), 700 Units/hr, Intravenous, Continuous, Titus Mould, RPH, Last Rate: 7 mL/hr at 02/02/16 0600, 700 Units/hr at 02/02/16 0600 .  levothyroxine (SYNTHROID, LEVOTHROID) tablet 25 mcg, 25 mcg, Oral, QAC breakfast, Jeralyn Bennett, MD, 25 mcg at 02/02/16 0735 .  ondansetron (ZOFRAN) tablet 4 mg, 4 mg, Oral, Q6H PRN **OR** ondansetron (ZOFRAN) injection 4 mg, 4 mg, Intravenous, Q6H PRN, Jeralyn Bennett, MD .  oxyCODONE (Oxy IR/ROXICODONE) immediate release tablet 10 mg, 10 mg, Oral, Q4H PRN, Jeralyn Bennett, MD, 10 mg at 02/02/16 0810 .  pneumococcal 23 valent vaccine (PNU-IMMUNE) injection 0.5 mL, 0.5 mL, Intramuscular, Tomorrow-1000, Jeralyn Bennett, MD, 0.5 mL at 01/30/16 0955 .  sodium bicarbonate tablet 650 mg, 650 mg, Oral, BID, Elease Etienne, MD, 650 mg at 02/01/16 2156 .  sodium  chloride flush (NS) 0.9 % injection 3 mL, 3 mL, Intravenous, Q12H, Jeralyn Bennett, MD, 3 mL at 01/31/16 2200 .  traZODone (DESYREL) tablet 25 mg, 25 mg, Oral, QHS PRN, Jeralyn Bennett, MD, 25 mg at 01/31/16 2258 .  venlafaxine (EFFEXOR) tablet 25 mg, 25 mg, Oral, BID, Jeralyn Bennett, MD, 25 mg at 02/02/16 0735    Vital Signs: BP 127/84 mmHg  Pulse 115  Temp(Src) 98.1 F (36.7 C) (Oral)  Resp 17  Ht 5\' 5"  (1.651 m)  Wt 111 lb 12.4 oz (50.7 kg)  BMI 18.60 kg/m2  SpO2 98%  LMP 10/01/2010  Physical Exam (L)LE soft, some residual edema of thigh and forefoot, mildly tender. Popliteal access site soft, dressing intact, no hematoma   Imaging: Ir Rande Lawman F/u Eval Art/ven Final Day (ms)  02/01/2016  INDICATION: Acute symptomatic left lower extremity DVT, May-Thurner syndrome, 24 hours status post thrombolysis. EXAM: IR THROMB F/U EVAL ART/VEN SUBSEQ DAY (MS); LEFT EXTREMITY VENOGRAPHY; IR TRANSCATH PLC STENT 1ST ART NOT LE CV CAR VERT CAR COMPARISON:  02/01/2016, 01/31/2016 MEDICATIONS: 1% lidocaine locally ANESTHESIA/SEDATION: Fentanyl 150 mcg IV The patient was continuously monitored during the procedure by the interventional radiology nurse under my direct supervision. FLUOROSCOPY TIME:  Fluoroscopy Time: 4 minutes 30 seconds (70 mGy). COMPLICATIONS: None immediate. TECHNIQUE: Informed written consent was obtained from the patient after a thorough discussion of the procedural risks, benefits and alternatives. All questions were addressed. Maximal Sterile Barrier Technique was utilized including caps, mask, sterile gowns, sterile gloves, sterile drape, hand hygiene and skin antiseptic. A timeout was performed prior to the initiation of the procedure. Under sterile conditions, follow-up  left lower extremity venogram performed through the existing access. This demonstrates complete lysis of the left iliofemoral DVT. No significant residual thrombus within the left lower extremity deep venous system.  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 Under sterile conditions and local anesthesia, the existing 6 French sheath was exchanged for a 7 French 50 cm sheath. The sheath was advanced into the left external iliac vein. Over an exchange guidewire, left iliac venogram was repeated. This confirms persistent occlusion of the left common iliac vein at the bifurcation. Initially, 12 mm venous angioplasty performed of the left common iliac venous occlusion. A single inflation was performed to approximately 15 atmospheres for 30 seconds. Balloon was deflated removed. Central venogram repeated. This demonstrates recanalization of the left common iliac occlusion. There is diffuse wall irregularity noted. The bifurcation can be identified. Lower IVC remains patent. Measurements obtained for the appropriate stent length and diameter. Stent placement: Under fluoroscopy, a 14 mm x 60 mm stent was deployed from the iliac bifurcation back into the left external iliac vein completely covering the common iliac occlusion. Post insertion, repeat venogram performed. This confirms successful recanalization of the left common iliac venous occlusion with restoration of antegrade venous flow. No significant residual stenosis. No residual thrombus. Images obtained for documentation. Access removed. Hemostasis obtained with compression. FINDINGS: Imaging confirms lysis of the left lower extremity DVT with left common iliac venous occlusion treated successfully with a 14 x 60 mm self expanding stent. 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 This was successfully treated with a 14 mm x 60 mm self expanding stent insertion with restoration of antegrade flow. Electronically Signed   By: Judie Petit.  Shick M.D.   On:  02/01/2016 17:25    Labs:  CBC:  Recent Labs  01/30/16 0429 01/31/16 0858 02/01/16 0400 02/02/16 0518  WBC 15.0* 9.5 10.5 7.6  HGB 8.8* 8.5* 7.8* 6.8*  HCT 26.7* 26.1* 24.2* 21.2*  PLT 116* 151 139* 127*    COAGS:  Recent Labs  03/01/15 0615 03/28/15 2334 03/29/15 0246 01/29/16 1252 01/31/16 0541 02/02/16 0518  INR 1.34  --  1.47 1.27 1.31 1.25  APTT 30 36 40*  --   --  126*    BMP:  Recent Labs  01/30/16 0429 01/31/16 0828 02/01/16 0400 02/02/16 0518  NA 134* 132* 136 135  K 3.9 4.8 4.1 3.7  CL 103 104 106 107  CO2 21* 16* 20* 21*  GLUCOSE 100* 107* 157* 135*  BUN CALCIUM 6.9* 7.4* 7.4* 7.3*  CREATININE 1.12* 0.84 0.75 0.68  GFRNONAA 53* >60 >60 >60  GFRAA >60 >60 >60 >60    LIVER FUNCTION TESTS:  Recent Labs  01/29/16 1252 01/30/16 0429 01/31/16 0828 02/01/16 0400  BILITOT 1.3* 1.0 1.0 1.3*  AST 478* 410* 172* 89*  ALT 73* 69* 60* 49  ALKPHOS 258* 184* 190* 157*  PROT 5.9* 4.7* 5.0* 4.7*  ALBUMIN 2.1* 1.6* 1.7* 1.6*    Assessment and Plan: LLE 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 Will need f/u in IR clinic after discharge in 3-4 weeks(we will arrange this)  Electronically Signed: Brayton El 02/02/2016, 9:44 AM   I spent a total of 15 Minutes at the the patient's  bedside AND on the patient's hospital floor or unit, greater than 50% of which was counseling/coordinating care for LLE DVT s/p lysis and stent

## 2016-02-03 LAB — CBC
HEMATOCRIT: 27.7 % — AB (ref 36.0–46.0)
Hemoglobin: 8.9 g/dL — ABNORMAL LOW (ref 12.0–15.0)
MCH: 31.1 pg (ref 26.0–34.0)
MCHC: 32.1 g/dL (ref 30.0–36.0)
MCV: 96.9 fL (ref 78.0–100.0)
PLATELETS: 125 10*3/uL — AB (ref 150–400)
RBC: 2.86 MIL/uL — AB (ref 3.87–5.11)
RDW: 22 % — ABNORMAL HIGH (ref 11.5–15.5)
WBC: 11 10*3/uL — ABNORMAL HIGH (ref 4.0–10.5)

## 2016-02-03 LAB — TYPE AND SCREEN
ABO/RH(D): B POS
ANTIBODY SCREEN: NEGATIVE
UNIT DIVISION: 0

## 2016-02-03 LAB — GLUCOSE, CAPILLARY
GLUCOSE-CAPILLARY: 77 mg/dL (ref 65–99)
GLUCOSE-CAPILLARY: 92 mg/dL (ref 65–99)
Glucose-Capillary: 58 mg/dL — ABNORMAL LOW (ref 65–99)
Glucose-Capillary: 66 mg/dL (ref 65–99)
Glucose-Capillary: 105 mg/dL — ABNORMAL HIGH (ref 65–99)

## 2016-02-03 LAB — HEPARIN LEVEL (UNFRACTIONATED): Heparin Unfractionated: 0.47 IU/mL (ref 0.30–0.70)

## 2016-02-03 MED ORDER — HYDROCORTISONE 2.5 % RE CREA
TOPICAL_CREAM | Freq: Two times a day (BID) | RECTAL | Status: DC | PRN
  Administered 2016-02-04: 02:00:00 via RECTAL
  Filled 2016-02-03: qty 28.35

## 2016-02-03 MED ORDER — BOOST / RESOURCE BREEZE PO LIQD
1.0000 | Freq: Three times a day (TID) | ORAL | Status: DC
  Administered 2016-02-03 – 2016-02-07 (×13): 1 via ORAL

## 2016-02-03 MED ORDER — PHENYLEPH-SHARK LIV OIL-MO-PET 0.25-3-14-71.9 % RE OINT
TOPICAL_OINTMENT | Freq: Two times a day (BID) | RECTAL | Status: DC | PRN
  Filled 2016-02-03 (×2): qty 28.4

## 2016-02-03 MED ORDER — SODIUM CHLORIDE 0.9% FLUSH: 10.0000 mL | INTRAVENOUS | Status: DC | PRN

## 2016-02-03 NOTE — Progress Notes (Signed)
PROGRESS NOTE  [name] ZOX:[id number] DOB: June 23, 1961 DOA: 01/29/2016 PCP: No primary care provider on file. Outpatient Specialists:    LOS: 5 days   Brief Narrative: 55 y.o. female with multiple comorbidities including type 2 diabetes mellitus, hypothyroidism, severe protein calorie malnutrition, history of pulmonary embolism, presented to the emergency department 4/30 with complaints of Left lower extremity pain associated with edema and erythema x 2 days. Upon presentation to the ER she was found to have 10/10 pain to that leg with decreased mobility. She had bedside ultrasound in ED which demonstrated occlusive DVT throughout the L lower extremity and she was started on IV heparin. 5/2 she went to IR for venous catheter placement for thrombolysis, post-procedure she was transferred to ICU for continued lytics. Once procedure was complete, she was transferred to floor and TRH took over 5/5.  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 Tolerated Eliquis well in the past, will resume tomorrow if CBC stable  Anemia - drop in Hb to 6.8 on 5/3 without clinical bleeding, s/p 1U pRBC, Hb 9.1 >> 8.9 - monitor, if stable start oral AC tomorrow  Shortness of breath.  - On admission she reported having some shortness of breath and was tachycardic in the emergency department as I was concerned for the possibility of a PE. - Initially  ordered a CT PE study however was informed of her contrast allergy. Ordered a VQ scan however she was unable to tolerate procedure. - Transthoracic echocardiogram as below, normal EF, RV looks good - resolved   History of GI bleed - He had been hospitalized in June 2016 for GI bleed while on Eliquis tx.  - concerning again drop after lysis, patient reports no bleeding - check FOBT  Elevated transaminases. - Presented with elevated AST, ALT and alkaline phosphatase. She has a history elevated liver enzymes. - She had a recent CT of abdomen performed on 01/09/2016 that revealed hepatic steatosis. - improved  Hypertension - Blood pressure normal, will continue holding antihypertensive agents    DVT prophylaxis: heparin Code Status: Full Family Communication: no family bedside Disposition Plan: home vs SNF when ready. PT eval pending Barriers for discharge: IV heparin  Consultants:   PCCM  IR  Procedures:   2D echo Study Conclusions - Left ventricle: The cavity size was normal. Systolic function was normal. The estimated ejection fraction was in the range of 55% to 60%. Wall motion was normal; there were no regional wall motion abnormalities. Doppler parameters are consistent with abnormal left ventricular relaxation (grade 1 diastolic dysfunction). Acoustic contrast opacification revealed no evidence ofthrombus.  Antimicrobials:  None    Subjective: - no complaints this morning, no chest pain, no dyspnea, no abdominal pain, nausea/vomiting  Objective: Filed Vitals:   02/02/16 1800 02/02/16 2000 02/02/16 2123 02/03/16 0757  BP:  124/88 120/84 127/83  Pulse:  85 77 84  Temp:   99 F (37.2 C) 98.3 F (  36.8 C)  TempSrc:   Oral Oral  Resp: 31 17 20 18   Height:      Weight:      SpO2:  100% 100% 100%    Intake/Output Summary (Last 24 hours) at 02/03/16 1020 Last data filed at 02/02/16 2130  Gross per 24 hour  Intake    261 ml  Output      0 ml  Net    261 ml     Filed Weights   01/30/16 1814 01/31/16 0340 02/01/16 0400  Weight: 50.259 kg (110 lb 12.8 oz) 50.349 kg (111 lb) 50.7 kg (111 lb 12.4 oz)    Examination: Constitutional: NAD Filed Vitals:   02/02/16 1800 02/02/16 2000 02/02/16 2123 02/03/16 0757  BP:  124/88 120/84 127/83  Pulse:  85 77 84  Temp:   99 F (37.2 C) 98.3 F (36.8 C)  TempSrc:   Oral Oral  Resp: 31 17 20 18   Height:      Weight:      SpO2:  100% 100% 100%   Eyes: PERRL, lids and conjunctivae normal ENMT: Mucous membranes are moist. No oropharyngeal exudates Respiratory: clear to auscultation bilaterally, no wheezing, no crackles. Normal respiratory effort.  Cardiovascular: Regular rate and rhythm, no murmurs / rubs / gallops. No extremity edema. 2+ pedal pulses. Abdomen: no tenderness. Bowel sounds positive.  Neurologic: non focal  Psychiatric: Normal judgment and insight. Alert and oriented x 3. Normal mood.     Data Reviewed: I have personally reviewed following labs and imaging studies  CBC:  Recent Labs Lab 01/29/16 1252  01/31/16 0858 02/01/16 0400 02/02/16 0518 02/02/16 1205 02/02/16 1653 02/03/16 0650  WBC 18.3*  < > 9.5 10.5 7.6  --  11.4* 11.0*  NEUTROABS 15.6*  --  8.5*  --   --   --   --   --   HGB 11.5*  < > 8.5* 7.8* 6.8* 8.5* 9.1* 8.9*  HCT 34.7*  < > 26.1* 24.2* 21.2* 26.0* 28.0* 27.7*  MCV 99.1  < > 97.0 102.1* 99.1  --  93.6 96.9  PLT 121*  < > 151 139* 127*  --  121* 125*  < > = values in this interval not displayed. Basic Metabolic Panel:  Recent Labs Lab 01/29/16 1252 01/30/16 0429 01/31/16 0828 02/01/16 0400 02/02/16 0518  NA 132* 134* 132* 136 135  K 4.5 3.9 4.8 4.1 3.7  CL 95* 103 104 106 107  CO2 18* 21* 16* 20* 21*  GLUCOSE 125* 100* 107* 157* 135*  BUN 13 14 11 8 8   CREATININE 1.34* 1.12* 0.84 0.75 0.68  CALCIUM 7.7* 6.9* 7.4* 7.4* 7.3*   GFR: Estimated Creatinine Clearance: 62.1 mL/min (by C-G formula based on Cr of 0.68). Liver Function Tests:  Recent  Labs Lab 01/29/16 1252 01/30/16 0429 01/31/16 0828 02/01/16 0400  AST 478* 410* 172* 89*  ALT 73* 69* 60* 49  ALKPHOS 258* 184* 190* 157*  BILITOT 1.3* 1.0 1.0 1.3*  PROT 5.9* 4.7* 5.0* 4.7*  ALBUMIN 2.1* 1.6* 1.7* 1.6*   Coagulation Profile:  Recent Labs Lab 01/29/16 1252 01/31/16 0541 02/02/16 0518  INR 1.27 1.31 1.25   Cardiac Enzymes:  Recent Labs Lab 01/31/16 1430  CKTOTAL 116   CBG:  Recent Labs Lab 02/01/16 1206 02/01/16 2018 02/01/16 2352 02/03/16 0753 02/03/16 0848  GLUCAP 214* 180* 147* 58* 66   Urine analysis:    Component Value Date/Time   COLORURINE AMBER* 01/09/2016 1404   APPEARANCEUR  CLOUDY* 01/09/2016 1404   LABSPEC 1.030 01/09/2016 1404   PHURINE 5.5 01/09/2016 1404   GLUCOSEU NEGATIVE 01/09/2016 1404   HGBUR SMALL* 01/09/2016 1404   BILIRUBINUR SMALL* 01/09/2016 1404   KETONESUR NEGATIVE 01/09/2016 1404   PROTEINUR 30* 01/09/2016 1404   UROBILINOGEN 1.0 02/28/2015 1227   NITRITE NEGATIVE 01/09/2016 1404   LEUKOCYTESUR MODERATE* 01/09/2016 1404   Sepsis Labs: Invalid input(s): PROCALCITONIN, LACTICIDVEN  Recent Results (from the past 240 hour(s))  MRSA PCR Screening     Status: None   Collection Time: 01/31/16  4:59 PM  Result Value Ref Range Status   MRSA by PCR NEGATIVE NEGATIVE Final    Comment:        The GeneXpert MRSA Assay (FDA approved for NASAL specimens only), is one component of a comprehensive MRSA colonization surveillance program. It is not intended to diagnose MRSA infection nor to guide or monitor treatment for MRSA infections.     Radiology Studies: Ir [facility] Stent 1st Art Not Lillette Boxer Car Vert Car  02/01/2016  INDICATION: Acute symptomatic left lower extremity DVT, May-Thurner syndrome, 24 hours status post thrombolysis. EXAM: IR THROMB F/U EVAL ART/VEN SUBSEQ DAY (MS); LEFT EXTREMITY VENOGRAPHY; IR TRANSCATH PLC STENT 1ST ART NOT LE CV CAR VERT CAR COMPARISON:  02/01/2016, 01/31/2016 MEDICATIONS: 1%  lidocaine locally ANESTHESIA/SEDATION: Fentanyl 150 mcg IV The patient was continuously monitored during the procedure by the interventional radiology nurse under my direct supervision. FLUOROSCOPY TIME:  Fluoroscopy Time: 4 minutes 30 seconds (70 mGy). COMPLICATIONS: None immediate. TECHNIQUE: Informed written consent was obtained from the patient after a thorough discussion of the procedural risks, benefits and alternatives. All questions were addressed. Maximal Sterile Barrier Technique was utilized including caps, mask, sterile gowns, sterile gloves, sterile drape, hand hygiene and skin antiseptic. A timeout was performed prior to the initiation of the procedure. Under sterile conditions, follow-up left lower extremity venogram performed through the existing access. This demonstrates complete lysis of the left iliofemoral DVT. No significant residual thrombus within the left lower extremity deep venous system. 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 Under sterile conditions and local anesthesia, the existing 6 French sheath was exchanged for a 7 French 50 cm sheath. The sheath was advanced into the left external iliac vein. Over an exchange guidewire, left iliac venogram was repeated. This confirms persistent occlusion of the left common iliac vein at the bifurcation. Initially, 12 mm venous angioplasty performed of the left common iliac venous occlusion. A single inflation was performed to approximately 15 atmospheres for 30 seconds. Balloon was deflated removed. Central venogram repeated. This demonstrates recanalization of the left common iliac occlusion. There is diffuse wall irregularity noted. The bifurcation can be identified. Lower IVC remains patent. Measurements obtained for the appropriate stent length and diameter. Stent placement: Under fluoroscopy, a 14 mm x 60 mm stent was  deployed from the iliac bifurcation back into the left external iliac vein completely covering the common iliac occlusion. Post insertion, repeat venogram performed. This confirms successful recanalization of the left common iliac venous occlusion with restoration of antegrade venous flow. No significant residual stenosis. No residual thrombus. Images obtained for documentation. Access removed. Hemostasis obtained with compression. FINDINGS: Imaging confirms lysis of the left lower extremity DVT with left common iliac venous occlusion treated successfully with a 14 x 60 mm self expanding stent. 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 This was successfully treated with a 14 mm x 60 mm self expanding stent insertion with restoration of antegrade flow. Electronically Signed   By: Judie Petit.  Shick M.D.   On: 02/01/2016 17:25   Ir Rande Lawman F/u Eval Art/ven Final Day (ms)  02/01/2016  INDICATION: Acute symptomatic left lower extremity DVT, May-Thurner syndrome, 24 hours status post thrombolysis. EXAM: IR THROMB F/U EVAL ART/VEN SUBSEQ DAY (MS); LEFT EXTREMITY VENOGRAPHY; IR TRANSCATH PLC STENT 1ST ART NOT LE CV CAR VERT CAR COMPARISON:  02/01/2016, 01/31/2016 MEDICATIONS: 1% lidocaine locally ANESTHESIA/SEDATION: Fentanyl 150 mcg IV The patient was continuously monitored during the procedure by the interventional radiology nurse under my direct supervision. FLUOROSCOPY TIME:  Fluoroscopy Time: 4 minutes 30 seconds (70 mGy). COMPLICATIONS: None immediate. TECHNIQUE: Informed written consent was obtained from the patient after a thorough discussion of the procedural risks, benefits and alternatives. All questions were addressed. Maximal Sterile Barrier Technique was utilized including caps, mask, sterile gowns, sterile gloves, sterile drape, hand hygiene and skin antiseptic. A timeout was performed prior to the  initiation of the procedure. Under sterile conditions, follow-up left lower extremity venogram performed through the existing access. This demonstrates complete lysis of the left iliofemoral DVT. No significant residual thrombus within the left lower extremity deep venous system. 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 Under sterile conditions and local anesthesia, the existing 6 French sheath was exchanged for a 7 French 50 cm sheath. The sheath was advanced into the left external iliac vein. Over an exchange guidewire, left iliac venogram was repeated. This confirms persistent occlusion of the left common iliac vein at the bifurcation. Initially, 12 mm venous angioplasty performed of the left common iliac venous occlusion. A single inflation was performed to approximately 15 atmospheres for 30 seconds. Balloon was deflated removed. Central venogram repeated. This demonstrates recanalization of the left common iliac occlusion. There is diffuse wall irregularity noted. The bifurcation can be identified. Lower IVC remains patent. Measurements obtained for the appropriate stent length and diameter. Stent placement: Under fluoroscopy, a 14 mm x 60 mm stent was deployed from the iliac bifurcation back into the left external iliac vein completely covering the common iliac occlusion. Post insertion, repeat venogram performed. This confirms successful recanalization of the left common iliac venous occlusion with restoration of antegrade venous flow. No significant residual stenosis. No residual thrombus. Images obtained for documentation. Access removed. Hemostasis obtained with compression. FINDINGS: Imaging confirms lysis of the left lower extremity DVT with left common iliac venous occlusion treated successfully with a 14 x 60 mm self expanding stent. 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 This was successfully treated with a 14 mm x 60 mm self expanding stent insertion with restoration of antegrade flow. Electronically Signed   By: Judie Petit.  Shick M.D.   On: 02/01/2016 17:25    Scheduled Meds: . sodium chloride   Intravenous Once  . calcium carbonate  1 tablet Oral BID WC  . gabapentin  100 mg Oral BID  . levothyroxine  25 mcg Oral QAC breakfast  . pneumococcal 23 valent vaccine  0.5 mL Intramuscular Tomorrow-1000  . sodium chloride flush  3 mL Intravenous Q12H  . venlafaxine  25 mg Oral BID   Continuous Infusions: . heparin 700 Units/hr (02/02/16 0600)  Pamella Pert, MD, PhD [facility] Pager [phone]  If 7PM-7AM, please contact night-coverage [email or web address] Password [email or web address] 02/03/2016, 10:20 AM

## 2016-02-03 NOTE — Progress Notes (Signed)
ANTICOAGULATION CONSULT NOTE  Pharmacy Consult:  Heparin Indication:   New LLE DVT  Allergies  Allergen Reactions  . Iohexol Other (See Comments)    "severe burning" Patient has received Contrast in 2005 with 13 hour pre-medication, and had no reaction at that time  . Spiriva Handihaler [Tiotropium Bromide Monohydrate] Nausea And Vomiting  . Ativan [Lorazepam] Other (See Comments)    hallucinations  . Penicillins Hives   Patient Measurements: Height: 5\' 5"  (165.1 cm) Weight: 111 lb 12.4 oz (50.7 kg) IBW/kg (Calculated) : 57 Heparin Dosing Weight: 50 kg  Vital Signs: Temp: 98.3 F (36.8 C) (05/05 0757) Temp Source: Oral (05/05 0757) BP: 127/83 mmHg (05/05 0757) Pulse Rate: 84 (05/05 0757)  Labs:  Recent Labs  01/31/16 0828  01/31/16 1430  02/01/16 0400  02/02/16 0518 02/02/16 1205 02/02/16 1653 02/03/16 0650  HGB  --   < >  --   --  7.8*  --  6.8* 8.5* 9.1* 8.9*  HCT  --   < >  --   --  24.2*  --  21.2* 26.0* 28.0* 27.7*  PLT  --   < >  --   --  139*  --  127*  --  121* 125*  APTT  --   --   --   --   --   --  126*  --   --   --   LABPROT  --   --   --   --   --   --  15.8*  --   --   --   INR  --   --   --   --   --   --  1.25  --   --   --   HEPARINUNFRC  --   --   --   < > 0.35  < > 0.55 0.37  --  0.47  CREATININE 0.84  --   --   --  0.75  --  0.68  --   --   --   CKTOTAL  --   --  116  --   --   --   --   --   --   --   < > = values in this interval not displayed. Estimated Creatinine Clearance: 62.1 mL/min (by C-G formula based on Cr of 0.68).  Assessment: 3 YOF with new LLE DVT to continue on IV heparin.  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 S/p IR catheter placement for thrombolysis, she is s/p catheter-directed TNKase (ended 5/3 ~1700).  Remains therapeutic on heparin at 700 untis/hr. S/p 1 unit PRBC yesterday- Hgb 8.9, plts remain low at 125. No overt bleeding noted.  Awaiting transition to PO anticoagulation.   Goal of  Therapy:  Heparin level 0.3-0.7 units/ml Monitor platelets by anticoagulation protocol: Yes  Plan:  - Continue heparin gtt at 700 units/hr  - Daily HL and CBC- follow closely for s/s bleeding - F/u plans for PO anticoagulation  Jadore Veals D. Emersyn Kotarski, PharmD, BCPS Clinical Pharmacist Pager: [phone] 02/03/2016 8:24 AM

## 2016-02-03 NOTE — Progress Notes (Signed)
Nutrition Follow-up  DOCUMENTATION CODES:   Severe malnutrition in context of chronic illness, Underweight  INTERVENTION:  Provide Boost Breeze po TID, each supplement provides 250 kcal and 9 grams of protein.  Encourage adequate PO intake.   NUTRITION DIAGNOSIS:   Increased nutrient needs related to chronic illness as evidenced by estimated needs; ongoing  GOAL:   Patient will meet greater than or equal to 90% of their needs; not met  MONITOR:   PO intake, Supplement acceptance, Labs, Weight trends, Skin, I & O's  REASON FOR ASSESSMENT:   Malnutrition Screening Tool    ASSESSMENT:   55 year old female with multiple comorbidities including type 2 diabetes mellitus, hypothyroidism, admitted to medicine service on 01/29/2016 when she presented with complaints of extensive left lower extremity swelling, erythema, pain. She was diagnosed with deep venous thrombosis including posterior tibial, peroneal, popliteal, femoral and common femoral veins. She was started on IV heparin. Case was discussed with Dr. Barbie Banner of interventional radiology who agreed that she would be candidate for thrombectomy. On 01/29/2006 spoke with interventional radiology who recommended transfer to [facility] to undergo procedure. LLE DVT THROMBOLYSIS 5/3.  Pt reports having a decreased appetite which has been ongoing over the past 1 month. Pt reports she has been only able to consume 1 meal a day. Current meal completion has been 25%. Per Epic weight records, pt with no weight loss. Pt is agreeable to Boost Breeze to aid in caloric and protein needs. Pt reports she has tried the milkshake type supplements and dislikes them reporting it cause abdominal discomfort. Pt will only consume Boost Breeze. RD to order. Pt was encouraged to eat her food at meals.  Nutrition-Focused physical exam completed. Findings are moderate fat depletion, moderate to severe muscle depletion, and mild edema.   Labs and medications reviewed.    Diet Order:  Diet regular Room service appropriate?: Yes; Fluid consistency:: Thin  Skin:  Reviewed, no issues  Last BM:  5/5  Height:   Ht Readings from Last 1 Encounters:  01/30/16 _0  (1.651 m)    Weight:   Wt Readings from Last 1 Encounters:  02/01/16 111 lb 12.4 oz (50.7 kg)    Ideal Body Weight:  56.8 kg  BMI:  Body mass index is 18.6 kg/(m^2).  Estimated Nutritional Needs:   Kcal:  1500-1700 (30 kcals/kg)  Protein:  75-85 g (1.5 g/kg)  Fluid:  1.5-1.7 L  EDUCATION NEEDS:   No education needs identified at this time  Corrin Parker, MS, RD, LDN Pager # [phone] After hours/ weekend pager # [phone]

## 2016-02-03 NOTE — Progress Notes (Signed)
PT Cancellation Note  Patient Details Name: [name] MRN: [id number] DOB: 02-15-1961   Cancelled Treatment:    Reason Eval/Treat Not Completed: Pain limiting ability to participate;Patient declined, no reason specified.  Will return at later date for PT evaluation.   [name] 02/03/2016, 4:43 PM Durenda Hurt. Renaldo Fiddler, [facility] Acute Rehab Services Pager [phone]'

## 2016-02-04 LAB — CBC
HCT: 27.6 % — ABNORMAL LOW (ref 36.0–46.0)
Hemoglobin: 9 g/dL — ABNORMAL LOW (ref 12.0–15.0)
MCH: 31.7 pg (ref 26.0–34.0)
MCHC: 32.6 g/dL (ref 30.0–36.0)
MCV: 97.2 fL (ref 78.0–100.0)
PLATELETS: 132 10*3/uL — AB (ref 150–400)
RBC: 2.84 MIL/uL — AB (ref 3.87–5.11)
RDW: 21.6 % — ABNORMAL HIGH (ref 11.5–15.5)
WBC: 10.5 10*3/uL (ref 4.0–10.5)

## 2016-02-04 LAB — BASIC METABOLIC PANEL
Anion gap: 9 (ref 5–15)
BUN: <5 mg/dL — ABNORMAL LOW (ref 6–20)
CALCIUM: 7.3 mg/dL — AB (ref 8.9–10.3)
CO2: 23 mmol/L (ref 22–32)
Chloride: 114 mmol/L — ABNORMAL HIGH (ref 101–111)
Creatinine, Ser: 0.69 mg/dL (ref 0.44–1.00)
Glucose, Bld: 85 mg/dL (ref 65–99)
Potassium: 3.1 mmol/L — ABNORMAL LOW (ref 3.5–5.1)
SODIUM: 146 mmol/L — AB (ref 135–145)

## 2016-02-04 LAB — HEPARIN LEVEL (UNFRACTIONATED): HEPARIN UNFRACTIONATED: 0.47 [IU]/mL (ref 0.30–0.70)

## 2016-02-04 MED ORDER — APIXABAN 5 MG PO TABS: 5.0000 mg | ORAL_TABLET | Freq: Two times a day (BID) | ORAL | Status: DC

## 2016-02-04 MED ORDER — APIXABAN 5 MG PO TABS
10.0000 mg | ORAL_TABLET | Freq: Two times a day (BID) | ORAL | Status: DC
Start: 2016-02-04 — End: 2016-02-08
  Administered 2016-02-04 – 2016-02-08 (×9): 10 mg via ORAL
  Filled 2016-02-04 (×9): qty 2

## 2016-02-04 MED ORDER — HYDROXYZINE HCL 25 MG PO TABS
25.0000 mg | ORAL_TABLET | Freq: Three times a day (TID) | ORAL | Status: DC | PRN
  Administered 2016-02-05 (×2): 25 mg via ORAL
  Filled 2016-02-04 (×2): qty 1

## 2016-02-04 MED ORDER — POTASSIUM CHLORIDE CRYS ER 20 MEQ PO TBCR
40.0000 meq | EXTENDED_RELEASE_TABLET | Freq: Four times a day (QID) | ORAL | Status: AC
  Administered 2016-02-04 (×2): 40 meq via ORAL
  Filled 2016-02-04 (×2): qty 2

## 2016-02-04 NOTE — Progress Notes (Signed)
PT Cancellation Note  Patient Details Name: [name] MRN: [id number] DOB: 1960/11/24   Cancelled Treatment:    Reason Eval/Treat Not Completed: Pain limiting ability to participate   [name] 02/04/2016, 5:38 PM Durenda Hurt. Renaldo Fiddler, [facility] Acute Rehab Services Pager [phone]

## 2016-02-04 NOTE — Progress Notes (Signed)
ANTICOAGULATION CONSULT NOTE  Pharmacy Consult:  Heparin Indication:   New LLE DVT  Allergies  Allergen Reactions  . Iohexol Other (See Comments)    "severe burning" Patient has received Contrast in 2005 with 13 hour pre-medication, and had no reaction at that time  . Spiriva Handihaler [Tiotropium Bromide Monohydrate] Nausea And Vomiting  . Ativan [Lorazepam] Other (See Comments)    hallucinations  . Penicillins Hives   Patient Measurements: Height: 5\' 5"  (165.1 cm) Weight: 107 lb 9.4 oz (48.8 kg) IBW/kg (Calculated) : 57 Heparin Dosing Weight: 50 kg  Vital Signs: Temp: 97.9 F (36.6 C) (05/06 0528) Temp Source: Oral (05/06 0528) BP: 119/79 mmHg (05/06 0528) Pulse Rate: 73 (05/06 0528)  Labs:  Recent Labs  02/02/16 0518 02/02/16 1205 02/02/16 1653 02/03/16 0650 02/04/16 0455  HGB 6.8* 8.5* 9.1* 8.9* 9.0*  HCT 21.2* 26.0* 28.0* 27.7* 27.6*  PLT 127*  --  121* 125* 132*  APTT 126*  --   --   --   --   LABPROT 15.8*  --   --   --   --   INR 1.25  --   --   --   --   HEPARINUNFRC 0.55 0.37  --  0.47 0.47  CREATININE 0.68  --   --   --  0.69   Estimated Creatinine Clearance: 59.8 mL/min (by C-G formula based on Cr of 0.69).  Assessment: 55 yo f with new LLE DVT. Pharmacy consulted to dose heparin IV.  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 S/p IR catheter placement for thrombolysis, she is s/p catheter-directed TNKase (ended 5/3 ~1700).  HL remains therapeutic at 0.47 on 700 units/hr. Hgb stable 9, plts remain low at 132. No issues per RN. F/u transition to eliquis.  Goal of Therapy:  Heparin level 0.3-0.7 units/ml Monitor platelets by anticoagulation protocol: Yes  Plan:  - Continue heparin infusion at 700 units/hr  - Daily HL and CBC- follow closely for s/s bleeding - F/u plans for PO anticoagulation  Cassie L. Roseanne Reno, PharmD PGY2 Infectious Diseases Pharmacy Resident Pager: [phone] 02/04/2016 10:07 AM

## 2016-02-04 NOTE — Progress Notes (Signed)
Informed patient of MD order to ambulate and be up in chair.  Also educated patient about ongoing order for SCD, TED hose.  Patient refused to allow RN to place SCD or TED at this time.  Patient also refused to ambulate, get up in chair or bathe, at this time.  Educated patient about the importance about all of the previous.  Will continue to offer.  Peri Maris, MBA, BSN, RN

## 2016-02-04 NOTE — Progress Notes (Signed)
PROGRESS NOTE  [name] GNF:[id number] DOB: [birth year] DOA: 01/29/2016 PCP: No primary care provider on file. Outpatient Specialists:    LOS: 6 days   Brief Narrative:  55 y.o. female with multiple comorbidities including type 2 diabetes mellitus, hypothyroidism, severe protein calorie malnutrition, history of pulmonary embolism, presented to the emergency department 4/30 with complaints of Left lower extremity pain associated with edema and erythema x 2 days. Upon presentation to the ER she was found to have 10/10 pain to that leg with decreased mobility. She had bedside ultrasound in ED which demonstrated occlusive DVT throughout the L lower extremity and she was started on IV heparin. 5/2 she went to IR for venous catheter placement for thrombolysis, post-procedure she was transferred to ICU for continued lytics. Once procedure was complete, she was transferred to floor and TRH took over 5/5.  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 On Heparin gtt currently, has prior history of PE, off of anticoagulation for the past year as she completed course. Tolerated Eliquis well in the past, will transition from heparin drip to Eliquis on 02/04/2016 and monitor H&H.  Anemia - drop in Hb to 6.8 on 5/3 without clinical bleeding, s/p 1U pRBC, Hb 9.1 >> 8.9, hemoglobin stable will monitor.    Shortness of breath.  - On admission she reported having some shortness of breath and was tachycardic in the emergency department ,  Initially ordered a CT PE study however was informed of her contrast allergy. Ordered a VQ scan however she was unable to tolerate procedure. Transthoracic echocardiogram as below, normal EF, RV looks good. Symptoms have  Resolved.  History of GI bleed - He had been hospitalized in June 2016 for GI bleed while on Eliquis tx.  Aparently this was in the setting of Crohn's colitis.She has had no issues with GI bleed in the last 2 years, continue to monitor closely. We'll have her follow with her GI physician post discharge. Her Crohn's is currently in remission for the last 2 years.    Elevated transaminases. - Presented with elevated AST, ALT and alkaline phosphatase. She has a history elevated liver enzymes. - She had a recent CT of abdomen performed on 01/09/2016 that revealed hepatic steatosis. Follow with GI outpatient. Liver enzymes trending down.    Hypertension - Blood pressure normal, will continue holding antihypertensive agents    DVT prophylaxis: heparin/Eliquis Code Status: Full Family Communication: no family bedside Disposition Plan: home vs SNF when ready. PT eval pending Barriers for discharge: IV heparin/Eliquis  Consultants:   PCCM  IR  Procedures:   2D echo Study Conclusions - Left ventricle: The cavity size was normal. Systolic function was normal. The estimated ejection fraction was in the range of 55% to 60%. Wall motion was normal; there were no regional wall motion abnormalities. Doppler parameters are consistent with abnormal left ventricular relaxation (grade 1 diastolic dysfunction). Acoustic contrast opacification revealed no evidence ofthrombus.  Antimicrobials:  None    Subjective: - no complaints this morning, no chest pain, no dyspnea, no abdominal pain, nausea/vomiting, improved L.Leg pain  Objective: Filed Vitals:   02/03/16 0757 02/03/16 1715 02/03/16 1959 02/04/16 0528  BP: 127/83 116/69 113/75 119/79  Pulse: 84 92 75 73  Temp: 98.3 F (36.8 C) 98.8 F (37.1 C) 98.3 F (36.8 C) 97.9 F (36.6 C)  TempSrc: Oral  Oral Oral Oral  Resp: 18 18 18 16   Height:      Weight:    48.8 kg (107 lb 9.4 oz)  SpO2: 100% 100% 100% 100%    Intake/Output Summary (Last 24 hours) at 02/04/16 1314 Last data filed at 02/04/16 0600  Gross per 24 hour  Intake    864 ml   Output      0 ml  Net    864 ml   Filed Weights   01/31/16 0340 02/01/16 0400 02/04/16 0528  Weight: 50.349 kg (111 lb) 50.7 kg (111 lb 12.4 oz) 48.8 kg (107 lb 9.4 oz)    Examination:  Filed Vitals:   02/03/16 0757 02/03/16 1715 02/03/16 1959 02/04/16 0528  BP: 127/83 116/69 113/75 119/79  Pulse: 84 92 75 73  Temp: 98.3 F (36.8 C) 98.8 F (37.1 C) 98.3 F (36.8 C) 97.9 F (36.6 C)  TempSrc: Oral Oral Oral Oral  Resp: 18 18 18 16   Height:      Weight:    48.8 kg (107 lb 9.4 oz)  SpO2: 100% 100% 100% 100%   Constitutional: NAD Eyes: PERRL, lids and conjunctivae normal ENMT: Mucous membranes are moist. No oropharyngeal exudates Respiratory: clear to auscultation bilaterally, no wheezing, no crackles. Normal respiratory effort.  Cardiovascular: Regular rate and rhythm, no murmurs / rubs / gallops. No extremity edema. 2+ pedal pulses. Abdomen: no tenderness. Bowel sounds positive.  Neurologic: non focal  Psychiatric: Normal judgment and insight. Alert and oriented x 3. Normal mood.     Data Reviewed: I have personally reviewed following labs and imaging studies  CBC:  Recent Labs Lab 01/29/16 1252  01/31/16 0858 02/01/16 0400 02/02/16 0518 02/02/16 1205 02/02/16 1653 02/03/16 0650 02/04/16 0455  WBC 18.3*  < > 9.5 10.5 7.6  --  11.4* 11.0* 10.5  NEUTROABS 15.6*  --  8.5*  --   --   --   --   --   --   HGB 11.5*  < > 8.5* 7.8* 6.8* 8.5* 9.1* 8.9* 9.0*  HCT 34.7*  < > 26.1* 24.2* 21.2* 26.0* 28.0* 27.7* 27.6*  MCV 99.1  < > 97.0 102.1* 99.1  --  93.6 96.9 97.2  PLT 121*  < > 151 139* 127*  --  121* 125* 132*  < > = values in this interval not displayed. Basic Metabolic Panel:  Recent Labs Lab 01/30/16 0429 01/31/16 0828 02/01/16 0400 02/02/16 0518 02/04/16 0455  NA 134* 132* 136 135 146*  K 3.9 4.8 4.1 3.7 3.1*  CL 103 104 106 107 114*  CO2 21* 16* 20* 21* 23  GLUCOSE 100* 107* 157* 135* 85  BUN 14 11 8 8  <5*  CREATININE 1.12* 0.84 0.75 0.68 0.69   CALCIUM 6.9* 7.4* 7.4* 7.3* 7.3*   GFR: Estimated Creatinine Clearance: 59.8 mL/min (by C-G formula based on Cr of 0.69). Liver Function Tests:  Recent Labs Lab 01/29/16 1252 01/30/16 0429 01/31/16 0828 02/01/16 0400  AST 478* 410* 172* 89*  ALT 73* 69* 60* 49  ALKPHOS 258* 184* 190* 157*  BILITOT 1.3* 1.0 1.0 1.3*  PROT 5.9* 4.7* 5.0* 4.7*  ALBUMIN 2.1* 1.6* 1.7* 1.6*   Coagulation Profile:  Recent Labs Lab 01/29/16 1252 01/31/16 0541 02/02/16 0518  INR 1.27 1.31 1.25   Cardiac Enzymes:  Recent Labs Lab 01/31/16 1430  CKTOTAL 116   CBG:  Recent Labs Lab 02/03/16 0753 02/03/16 0848 02/03/16 1131 02/03/16 1707 02/03/16 1950  GLUCAP 58* 66 77  105* 92   Urine analysis:    Component Value Date/Time   COLORURINE AMBER* 01/09/2016 1404   APPEARANCEUR CLOUDY* 01/09/2016 1404   LABSPEC 1.030 01/09/2016 1404   PHURINE 5.5 01/09/2016 1404   GLUCOSEU NEGATIVE 01/09/2016 1404   HGBUR SMALL* 01/09/2016 1404   BILIRUBINUR SMALL* 01/09/2016 1404   KETONESUR NEGATIVE 01/09/2016 1404   PROTEINUR 30* 01/09/2016 1404   UROBILINOGEN 1.0 02/28/2015 1227   NITRITE NEGATIVE 01/09/2016 1404   LEUKOCYTESUR MODERATE* 01/09/2016 1404   Sepsis Labs: Invalid input(s): PROCALCITONIN, LACTICIDVEN  Recent Results (from the past 240 hour(s))  MRSA PCR Screening     Status: None   Collection Time: 01/31/16  4:59 PM  Result Value Ref Range Status   MRSA by PCR NEGATIVE NEGATIVE Final    Comment:        The GeneXpert MRSA Assay (FDA approved for NASAL specimens only), is one component of a comprehensive MRSA colonization surveillance program. It is not intended to diagnose MRSA infection nor to guide or monitor treatment for MRSA infections.     Radiology Studies: No results found.  Scheduled Meds: . sodium chloride   Intravenous Once  . calcium carbonate  1 tablet Oral BID WC  . feeding supplement  1 Container Oral TID BM  . gabapentin  100 mg Oral BID  .  levothyroxine  25 mcg Oral QAC breakfast  . pneumococcal 23 valent vaccine  0.5 mL Intramuscular Tomorrow-1000  . potassium chloride  40 mEq Oral Q6H  . venlafaxine  25 mg Oral BID   Continuous Infusions: . heparin 700 Units/hr (02/02/16 0600)   Signature  Susa Raring K M.D on 02/04/2016 at 1:14 PM  Between 7am to 7pm - Pager - [phone], After 7pm go to [email or web address] - password [facility]  [facility]  - Office  [phone]

## 2016-02-05 ENCOUNTER — Inpatient Hospital Stay (HOSPITAL_COMMUNITY): Payer: Medicare Other

## 2016-02-05 DIAGNOSIS — R0600 Dyspnea, unspecified: Secondary | ICD-10-CM | POA: Insufficient documentation

## 2016-02-05 DIAGNOSIS — R109 Unspecified abdominal pain: Secondary | ICD-10-CM

## 2016-02-05 DIAGNOSIS — R079 Chest pain, unspecified: Secondary | ICD-10-CM | POA: Insufficient documentation

## 2016-02-05 LAB — CBC
HEMATOCRIT: 28 % — AB (ref 36.0–46.0)
HEMATOCRIT: 28.2 % — AB (ref 36.0–46.0)
HEMOGLOBIN: 8.7 g/dL — AB (ref 12.0–15.0)
Hemoglobin: 8.7 g/dL — ABNORMAL LOW (ref 12.0–15.0)
MCH: 30.5 pg (ref 26.0–34.0)
MCH: 30.1 pg (ref 26.0–34.0)
MCHC: 31.1 g/dL (ref 30.0–36.0)
MCHC: 30.9 g/dL (ref 30.0–36.0)
MCV: 98.2 fL (ref 78.0–100.0)
MCV: 97.6 fL (ref 78.0–100.0)
Platelets: 142 10*3/uL — ABNORMAL LOW (ref 150–400)
Platelets: 141 10*3/uL — ABNORMAL LOW (ref 150–400)
RBC: 2.85 MIL/uL — AB (ref 3.87–5.11)
RBC: 2.89 MIL/uL — AB (ref 3.87–5.11)
RDW: 21.2 % — ABNORMAL HIGH (ref 11.5–15.5)
RDW: 20.5 % — ABNORMAL HIGH (ref 11.5–15.5)
WBC: 10.8 10*3/uL — ABNORMAL HIGH (ref 4.0–10.5)
WBC: 11.6 10*3/uL — AB (ref 4.0–10.5)

## 2016-02-05 LAB — URINALYSIS, ROUTINE W REFLEX MICROSCOPIC
BILIRUBIN URINE: NEGATIVE
GLUCOSE, UA: NEGATIVE mg/dL
KETONES UR: NEGATIVE mg/dL
Nitrite: NEGATIVE
PH: 5.5 (ref 5.0–8.0)
Protein, ur: NEGATIVE mg/dL
Specific Gravity, Urine: 1.009 (ref 1.005–1.030)

## 2016-02-05 LAB — COMPREHENSIVE METABOLIC PANEL
ALT: 23 U/L (ref 14–54)
AST: 28 U/L (ref 15–41)
Albumin: 1.5 g/dL — ABNORMAL LOW (ref 3.5–5.0)
Alkaline Phosphatase: 96 U/L (ref 38–126)
Anion gap: 8 (ref 5–15)
BILIRUBIN TOTAL: 0.5 mg/dL (ref 0.3–1.2)
CALCIUM: 7.4 mg/dL — AB (ref 8.9–10.3)
CO2: 23 mmol/L (ref 22–32)
CREATININE: 0.62 mg/dL (ref 0.44–1.00)
Chloride: 111 mmol/L (ref 101–111)
Glucose, Bld: 103 mg/dL — ABNORMAL HIGH (ref 65–99)
Potassium: 4 mmol/L (ref 3.5–5.1)
Sodium: 142 mmol/L (ref 135–145)
TOTAL PROTEIN: 4.3 g/dL — AB (ref 6.5–8.1)

## 2016-02-05 LAB — URINE MICROSCOPIC-ADD ON

## 2016-02-05 LAB — LIPASE, BLOOD: Lipase: 12 U/L (ref 11–51)

## 2016-02-05 LAB — LACTIC ACID, PLASMA: LACTIC ACID, VENOUS: 2.7 mmol/L — AB (ref 0.5–2.0)

## 2016-02-05 MED ORDER — PANTOPRAZOLE SODIUM 40 MG PO TBEC
40.0000 mg | DELAYED_RELEASE_TABLET | Freq: Every day | ORAL | Status: DC
  Administered 2016-02-05 – 2016-02-08 (×4): 40 mg via ORAL
  Filled 2016-02-05 (×5): qty 1

## 2016-02-05 MED ORDER — SODIUM CHLORIDE 0.9 % IV BOLUS (SEPSIS): 2500.0000 mL | Freq: Once | INTRAVENOUS | Status: DC

## 2016-02-05 MED ORDER — BARIUM SULFATE 2.1 % PO SUSP
ORAL | Status: AC
  Administered 2016-02-05: 1 mL
  Filled 2016-02-05: qty 2

## 2016-02-05 MED ORDER — SODIUM CHLORIDE 0.9 % IV SOLN
INTRAVENOUS | Status: DC
  Administered 2016-02-06: 06:00:00 via INTRAVENOUS

## 2016-02-05 MED ORDER — SODIUM CHLORIDE 0.9 % IV BOLUS (SEPSIS)
2500.0000 mL | Freq: Once | INTRAVENOUS | Status: AC
  Administered 2016-02-06: 2500 mL via INTRAVENOUS

## 2016-02-05 MED ORDER — SODIUM CHLORIDE 0.9 % IV SOLN: INTRAVENOUS | Status: DC

## 2016-02-05 NOTE — Progress Notes (Signed)
PROGRESS NOTE  [name] JQZ:[id number] DOB: [birth year] DOA: 01/29/2016 PCP: No primary care provider on file. Outpatient Specialists:    LOS: 7 days   Brief Narrative:  55 y.o. female with multiple comorbidities including type 2 diabetes mellitus, hypothyroidism, severe protein calorie malnutrition, history of pulmonary embolism, presented to the emergency department 4/30 with complaints of Left lower extremity pain associated with edema and erythema x 2 days. Upon presentation to the ER she was found to have 10/10 pain to that leg with decreased mobility. She had bedside ultrasound in ED which demonstrated occlusive DVT throughout the L lower extremity and she was started on IV heparin. 5/2 she went to IR for venous catheter placement for thrombolysis, post-procedure she was transferred to ICU for continued lytics. Once procedure was complete, she was transferred to floor and TRH took over 5/5.  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 She has prior history of PE, was off of anticoagulation for the past year as she completed course. Tolerated Eliquis well in the past, have transitioned her from heparin drip to Eliquis on 02/04/2016, will monitor H&H.  Anemia - drop in Hb to 6.8 on 5/3 without clinical bleeding, s/p 1U pRBC, Hb 9.1 >> 8.9, hemoglobin stable will monitor.    Shortness of breath.  - On admission she reported having some shortness of breath and was tachycardic in the emergency department ,  Initially ordered a CT PE study however was informed of her contrast allergy. Ordered a VQ scan however she was unable to tolerate procedure. Transthoracic echocardiogram as below, normal EF, RV looks good. Symptoms have  Resolved.  History of GI bleed - He had been hospitalized in June 2016 for GI bleed while on Eliquis tx. Aparently  this was in the setting of Crohn's colitis.She has had no issues with GI bleed in the last 2 years, continue to monitor closely. We'll have her follow with her GI physician post discharge. Her Crohn's is currently in remission for the last 2 years.    Elevated transaminases. - Presented with elevated AST, ALT and alkaline phosphatase. She has a history elevated liver enzymes. - She had a recent CT of abdomen performed on 01/09/2016 that revealed hepatic steatosis. Follow with GI outpatient. Liver enzymes trending down.    Hypertension - Blood pressure normal, will continue holding antihypertensive agents    DVT prophylaxis: heparin/Eliquis Code Status: Full Family Communication: no family bedside Disposition Plan: home vs SNF when ready. PT eval pending Barriers for discharge:   On 02/05/2016  - PT eval, patient refused to go today, refused to work with PT, refused to sit up in the recliner on 02/04/2016. On 02/05/2016 she refused to go home and requested me to keep her here one more day for PT eval. She promised she would cooperate today and will go home tomorrow.  Consultants:   PCCM  IR  Procedures:    2D echo  - Left ventricle: The cavity size was normal. Systolic function was normal. The estimated ejection fraction was in the range of 55% to 60%. Wall motion was normal; there were no regional wall motion abnormalities. Doppler parameters are consistent with abnormal left ventricular relaxation (grade 1 diastolic dysfunction). Acoustic contrast opacification revealed no evidence ofthrombus.  Antimicrobials:  None    Subjective: - no complaints this morning, no chest pain, no  dyspnea, no abdominal pain, nausea/vomiting, improved L.Leg pain  Objective: Filed Vitals:   02/04/16 1819 02/04/16 2203 02/05/16 0546 02/05/16 0820  BP: 125/68 103/64 106/65 118/71  Pulse: 78 75 74   Temp: 98.8 F (37.1 C) 98.7 F (37.1 C) 98.1 F (36.7 C) 98.4 F (36.9 C)  TempSrc: Oral  Oral Oral Oral  Resp: Height:      Weight:   52.8 kg (116 lb 6.5 oz)   SpO2: 100% 100% 100% 100%    Intake/Output Summary (Last 24 hours) at 02/05/16 1148 Last data filed at 02/05/16 1054  Gross per 24 hour  Intake   1020 ml  Output      0 ml  Net   1020 ml   Filed Weights   02/01/16 0400 02/04/16 0528 02/05/16 0546  Weight: 50.7 kg (111 lb 12.4 oz) 48.8 kg (107 lb 9.4 oz) 52.8 kg (116 lb 6.5 oz)    Examination:  Filed Vitals:   02/04/16 1819 02/04/16 2203 02/05/16 0546 02/05/16 0820  BP: 125/68 103/64 106/65 118/71  Pulse: 78 75 74   Temp: 98.8 F (37.1 C) 98.7 F (37.1 C) 98.1 F (36.7 C) 98.4 F (36.9 C)  TempSrc: Oral Oral Oral Oral  Resp: Height:      Weight:   52.8 kg (116 lb 6.5 oz)   SpO2: 100% 100% 100% 100%   Constitutional: NAD Eyes: PERRL, lids and conjunctivae normal ENMT: Mucous membranes are moist. No oropharyngeal exudates Respiratory: clear to auscultation bilaterally, no wheezing, no crackles. Normal respiratory effort.  Cardiovascular: Regular rate and rhythm, no murmurs / rubs / gallops. No extremity edema. 2+ pedal pulses. Abdomen: no tenderness. Bowel sounds positive.  Neurologic: non focal  Psychiatric: Normal judgment and insight. Alert and oriented x 3. Normal mood.     Data Reviewed: I have personally reviewed following labs and imaging studies  CBC:  Recent Labs Lab 01/29/16 1252  01/31/16 0858  02/02/16 0518 02/02/16 1205 02/02/16 1653 02/03/16 0650 02/04/16 0455 02/05/16 0620  WBC 18.3*  < > 9.5  < > 7.6  --  11.4* 11.0* 10.5 10.8*  NEUTROABS 15.6*  --  8.5*  --   --   --   --   --   --   --   HGB 11.5*  < > 8.5*  < > 6.8* 8.5* 9.1* 8.9* 9.0* 8.7*  HCT 34.7*  < > 26.1*  < > 21.2* 26.0* 28.0* 27.7* 27.6* 28.0*  MCV 99.1  < > 97.0  < > 99.1  --  93.6 96.9 97.2 98.2  PLT 121*  < > 151  < > 127*  --  121* 125* 132* 142*  < > = values in this interval not displayed. Basic Metabolic Panel:  Recent  Labs Lab 01/30/16 0429 01/31/16 0828 02/01/16 0400 02/02/16 0518 02/04/16 0455  NA 134* 132* 136 135 146*  K 3.9 4.8 4.1 3.7 3.1*  CL 103 104 106 107 114*  CO2 21* 16* 20* 21* 23  GLUCOSE 100* 107* 157* 135* 85  BUN <5*  CREATININE 1.12* 0.84 0.75 0.68 0.69  CALCIUM 6.9* 7.4* 7.4* 7.3* 7.3*   GFR: Estimated Creatinine Clearance: 64.7 mL/min (by C-G formula based on Cr of 0.69). Liver Function Tests:  Recent Labs Lab 01/29/16 1252 01/30/16 0429 01/31/16 0828 02/01/16 0400  AST 478* 410* 172* 89*  ALT 73* 69* 60* 49  ALKPHOS 258* 184*  190* 157*  BILITOT 1.3* 1.0 1.0 1.3*  PROT 5.9* 4.7* 5.0* 4.7*  ALBUMIN 2.1* 1.6* 1.7* 1.6*   Coagulation Profile:  Recent Labs Lab 01/29/16 1252 01/31/16 0541 02/02/16 0518  INR 1.27 1.31 1.25   Cardiac Enzymes:  Recent Labs Lab 01/31/16 1430  CKTOTAL 116   CBG:  Recent Labs Lab 02/03/16 0753 02/03/16 0848 02/03/16 1131 02/03/16 1707 02/03/16 1950  GLUCAP 58* 66 77 105* 92   Urine analysis:    Component Value Date/Time   COLORURINE AMBER* 01/09/2016 1404   APPEARANCEUR CLOUDY* 01/09/2016 1404   LABSPEC 1.030 01/09/2016 1404   PHURINE 5.5 01/09/2016 1404   GLUCOSEU NEGATIVE 01/09/2016 1404   HGBUR SMALL* 01/09/2016 1404   BILIRUBINUR SMALL* 01/09/2016 1404   KETONESUR NEGATIVE 01/09/2016 1404   PROTEINUR 30* 01/09/2016 1404   UROBILINOGEN 1.0 02/28/2015 1227   NITRITE NEGATIVE 01/09/2016 1404   LEUKOCYTESUR MODERATE* 01/09/2016 1404   Sepsis Labs: Invalid input(s): PROCALCITONIN, LACTICIDVEN  Recent Results (from the past 240 hour(s))  MRSA PCR Screening     Status: None   Collection Time: 01/31/16  4:59 PM  Result Value Ref Range Status   MRSA by PCR NEGATIVE NEGATIVE Final    Comment:        The GeneXpert MRSA Assay (FDA approved for NASAL specimens only), is one component of a comprehensive MRSA colonization surveillance program. It is not intended to diagnose MRSA infection nor to  guide or monitor treatment for MRSA infections.     Radiology Studies: No results found.  Scheduled Meds: . sodium chloride   Intravenous Once  . apixaban  10 mg Oral BID   Followed by  . [START ON 02/11/2016] apixaban  5 mg Oral BID  . calcium carbonate  1 tablet Oral BID WC  . feeding supplement  1 Container Oral TID BM  . gabapentin  100 mg Oral BID  . levothyroxine  25 mcg Oral QAC breakfast  . pantoprazole  40 mg Oral Daily  . pneumococcal 23 valent vaccine  0.5 mL Intramuscular Tomorrow-1000  . venlafaxine  25 mg Oral BID   Continuous Infusions:   Signature  Susa Raring K M.D on 02/05/2016 at 11:48 AM  Between 7am to 7pm - Pager - [phone], After 7pm go to [email or web address] - password [facility]  [facility]  - Office  [phone]

## 2016-02-05 NOTE — Progress Notes (Signed)
ANTICOAGULATION CONSULT NOTE  Pharmacy Consult:  Eliquis Indication:   New LLE DVT  Allergies  Allergen Reactions  . Iohexol Other (See Comments)    "severe burning" Patient has received Contrast in 2005 with 13 hour pre-medication, and had no reaction at that time  . Spiriva Handihaler [Tiotropium Bromide Monohydrate] Nausea And Vomiting  . Ativan [Lorazepam] Other (See Comments)    hallucinations  . Penicillins Hives   Patient Measurements: Height: 5\' 5"  (165.1 cm) Weight: 116 lb 6.5 oz (52.8 kg) IBW/kg (Calculated) : 57  Vital Signs: Temp: 98.4 F (36.9 C) (05/07 0820) Temp Source: Oral (05/07 0820) BP: 118/71 mmHg (05/07 0820) Pulse Rate: 74 (05/07 0546)  Labs:  Recent Labs  02/02/16 1205  02/03/16 0650 02/04/16 0455 02/05/16 0620  HGB 8.5*  < > 8.9* 9.0* 8.7*  HCT 26.0*  < > 27.7* 27.6* 28.0*  PLT  --   < > 125* 132* 142*  HEPARINUNFRC 0.37  --  0.47 0.47  --   CREATININE  --   --   --  0.69  --   < > = values in this interval not displayed. Estimated Creatinine Clearance: 64.7 mL/min (by C-G formula based on Cr of 0.69).  Assessment: 55 yo f with new LLE DVT. Pharmacy consulted to dose Eliquis (was on heparin IV). CBC stable. No issues per RN.  Goal of Therapy:  Monitor platelets by anticoagulation protocol: Yes  Plan:  - Eliquis 10 mg PO BID x 7 days then 5 mg PO BID - Watch for s/s of bleeding  Lateya Dauria L. Roseanne Reno, PharmD PGY2 Infectious Diseases Pharmacy Resident Pager: [phone] 02/05/2016 11:52 AM

## 2016-02-05 NOTE — Progress Notes (Signed)
Patient reported some swelling to left leg.  Refused to get up or ambulate.  Wants MD to examine.  Peri Maris, MBA, BSN, RN

## 2016-02-05 NOTE — Progress Notes (Addendum)
PROGRESS NOTE  [name] ZOX:[id number] DOB: [birth year] DOA: 01/29/2016 PCP: No primary care provider on file. Outpatient Specialists:    LOS: 7 days   Brief Narrative:  55 y.o. female with multiple comorbidities including type 2 diabetes mellitus, hypothyroidism, severe protein calorie malnutrition, history of pulmonary embolism, presented to the emergency department 4/30 with complaints of Left lower extremity pain associated with edema and erythema x 2 days. Upon presentation to the ER she was found to have 10/10 pain to that leg with decreased mobility. She had bedside ultrasound in ED which demonstrated occlusive DVT throughout the L lower extremity and she was started on IV heparin. 5/2 she went to IR for venous catheter placement for thrombolysis, post-procedure she was transferred to ICU for continued lytics. Once procedure was complete, she was transferred to floor and TRH took over 5/5.  She was initially treated with IV heparin which is switched to Eliquis on 02/04/16. Pt reports that she has abdominal pain since this AM and continues to have chest pain.   Patient's abdominal pain is diffuse, constant, 10 out of 10 in severity, nonradiating. There is no nausea, vomiting, diarrhea. No fever or chills. She said that she did not tell her doctor this morning because she wanted to go home early. Patient denies rectal bleeding, hematemesis. No symptoms of UTI. Of note, she had abnormal liver function by CMP on 02/01/16 with ALP 157, AST 89, ALT 49, total bilirubin 1.3.   Her chest pain is located in the central frontal chest, constant, 10 out of 10 in severity nonradiating. It is associated with shortness of breath. Her oxygen saturation is 98% on room air. Patient does not have cough, fever or chills. She is also complaining of constant pain over left leg.   Assessment & Plan:  Abdominal pain: Etiology is not clear. Patient does not have nausea, vomiting, diarrhea, less likely to have  gastroenteritis. Differential diagnosis includes, pancreatitis, hepatitis, gallstone, cholecystitis, ischemic bowel and intra-abdominal bleeding. Stat CXR and x-ray of abd is negative for acute abnormalities, except for possible very small left pleural effusion. -will check lipase, hepatitis panel, CBC, CMP, UA -CT-abd/pelvis -pain control -d/c tylenol  Addendum: CT abdomen/pelvis is negative for bowel obstruction or perforation, no acute inflammatory changes are evident in the abdomen or Pelvis, left lung base atelectasis or infiltrate, bilateral nephrolithiasis. No ureteral calculus. No evidence of ureteral obstruction. lipase 12. Lactate is 2.7, WBC 11.6-->indicating sepsis, possible PNA?  -PA, Vernona Rieger ordered zosyn which is switched per pharmacy due to penicillin allergy -will get Procalcitonin and trend lactic acid levels per sepsis protocol. -IVF: 2.5 L of NS bolus in ED, followed by 75 cc/h -Gen. surgery was consulted by PA, Vernona Rieger, will see in am.   CP and Shortness of breath: pt is at risk of developing PE. Per Dr. Thedore Mins note, on admission she reported having some shortness of breath. Initially ordered a CT PE study however was informed of her contrast allergy. Ordered a VQ scan however she was unable to tolerate procedure. Transthoracic echocardiogram on 5/1 with normal EF, RV looks good. Since pt is already on Eliquis, and her oxygen saturation is normal, I don't think there is urgent need for further workup tonight. 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 She  has prior history of PE, was off of anticoagulation for the past year as she completed course. Tolerated Eliquis well in the past, have transitioned her from heparin drip to Eliquis on 02/04/2016, -will monitor  H&H.  Anemia - drop in Hb to 6.8 on 5/3 without clinical bleeding, s/p 1U pRBC, Hb 9.1 >> 8.9>>8.7, hemoglobin stable will monitor. -f/u stat CCB   History of GI bleed - He had been hospitalized in June 2016 for GI bleed while on Eliquis tx. Aparently this was in the setting of Crohn's colitis.She has had no issues with GI bleed in the last 2 years, continue to monitor closely. We'll have her follow with her GI physician post discharge. Her Crohn's is currently in remission for the last 2 years. -f/u stat CBC   Hypertension - Blood pressure normal, will continue holding antihypertensive agents    DVT prophylaxis: on Eliquis Code Status: Full Family Communication: no family bedside Disposition Plan: home vs SNF when ready. PT eval pending Barriers for discharge:   Consultants:   PCCM  IR  Procedures:    2D echo  - Left ventricle: The cavity size was normal. Systolic function was normal. The estimated ejection fraction was in the range of 55% to 60%. Wall motion was normal; there were no regional wall motion abnormalities. Doppler parameters are consistent with abnormal left ventricular relaxation (grade 1 diastolic dysfunction). Acoustic contrast opacification revealed no evidence ofthrombus.  Antimicrobials:  None    Subjective: -Chest pain, abdominal pain, left leg pain  Objective: Filed Vitals:   02/05/16 0546 02/05/16 0820 02/05/16 1833 02/05/16 1920  BP: 106/65 118/71 120/64 106/71  Pulse: 74   118  Temp: 98.1 F (36.7 C) 98.4 F (36.9 C) 98.6 F (37 C) 99 F (37.2 C)  TempSrc: Oral Oral Oral Oral  Resp: 20 20 20 22   Height:      Weight: 52.8 kg (116 lb 6.5 oz)     SpO2: 100% 100% 100% 98%    Intake/Output Summary (Last 24 hours) at 02/05/16 2049 Last data filed at 02/05/16 1054  Gross per 24 hour  Intake    660 ml  Output      0 ml  Net    660 ml   Filed Weights   02/01/16 0400 02/04/16 0528 02/05/16 0546  Weight: 50.7 kg (111 lb 12.4 oz) 48.8  kg (107 lb 9.4 oz) 52.8 kg (116 lb 6.5 oz)    Examination:  Filed Vitals:   02/05/16 0546 02/05/16 0820 02/05/16 1833 02/05/16 1920  BP: 106/65 118/71 120/64 106/71  Pulse: 74   118  Temp: 98.1 F (36.7 C) 98.4 F (36.9 C) 98.6 F (37 C) 99 F (37.2 C)  TempSrc: Oral Oral Oral Oral  Resp: 20 20 20 22   Height:      Weight: 52.8 kg (116 lb 6.5 oz)     SpO2: 100% 100% 100% 98%   Constitutional: NAD Eyes: PERRL, lids and conjunctivae normal ENMT: Mucous membranes are moist. No oropharyngeal exudates Respiratory: clear to auscultation bilaterally, no wheezing, no crackles. Normal respiratory effort.  Cardiovascular: Regular rate and rhythm, no murmurs / rubs / gallops. No extremity edema. 2+ pedal pulses. Abdomen: diffused tenderness. Soft, no rebound pain, Bowel sounds positive.  Neurologic: non focal  Psychiatric: Normal judgment and insight. Alert and oriented x 3. Normal mood.     Data Reviewed: I have personally reviewed following labs and imaging studies  CBC:  Recent Labs Lab 01/31/16 0858  02/02/16 0518  02/02/16 1205 02/02/16 1653 02/03/16 0650 02/04/16 0455 02/05/16 0620  WBC 9.5  < > 7.6  --  11.4* 11.0* 10.5 10.8*  NEUTROABS 8.5*  --   --   --   --   --   --   --   HGB 8.5*  < > 6.8* 8.5* 9.1* 8.9* 9.0* 8.7*  HCT 26.1*  < > 21.2* 26.0* 28.0* 27.7* 27.6* 28.0*  MCV 97.0  < > 99.1  --  93.6 96.9 97.2 98.2  PLT 151  < > 127*  --  121* 125* 132* 142*  < > = values in this interval not displayed. Basic Metabolic Panel:  Recent Labs Lab 01/30/16 0429 01/31/16 0828 02/01/16 0400 02/02/16 0518 02/04/16 0455  NA 134* 132* 136 135 146*  K 3.9 4.8 4.1 3.7 3.1*  CL 103 104 106 107 114*  CO2 21* 16* 20* 21* 23  GLUCOSE 100* 107* 157* 135* 85  BUN 14 11 8 8  <5*  CREATININE 1.12* 0.84 0.75 0.68 0.69  CALCIUM 6.9* 7.4* 7.4* 7.3* 7.3*   GFR: Estimated Creatinine Clearance: 64.7 mL/min (by C-G formula based on Cr of 0.69). Liver Function Tests:  Recent  Labs Lab 01/30/16 0429 01/31/16 0828 02/01/16 0400  AST 410* 172* 89*  ALT 69* 60* 49  ALKPHOS 184* 190* 157*  BILITOT 1.0 1.0 1.3*  PROT 4.7* 5.0* 4.7*  ALBUMIN 1.6* 1.7* 1.6*   Coagulation Profile:  Recent Labs Lab 01/31/16 0541 02/02/16 0518  INR 1.31 1.25   Cardiac Enzymes:  Recent Labs Lab 01/31/16 1430  CKTOTAL 116   CBG:  Recent Labs Lab 02/03/16 0753 02/03/16 0848 02/03/16 1131 02/03/16 1707 02/03/16 1950  GLUCAP 58* 66 77 105* 92   Urine analysis:    Component Value Date/Time   COLORURINE AMBER* 01/09/2016 1404   APPEARANCEUR CLOUDY* 01/09/2016 1404   LABSPEC 1.030 01/09/2016 1404   PHURINE 5.5 01/09/2016 1404   GLUCOSEU NEGATIVE 01/09/2016 1404   HGBUR SMALL* 01/09/2016 1404   BILIRUBINUR SMALL* 01/09/2016 1404   KETONESUR NEGATIVE 01/09/2016 1404   PROTEINUR 30* 01/09/2016 1404   UROBILINOGEN 1.0 02/28/2015 1227   NITRITE NEGATIVE 01/09/2016 1404   LEUKOCYTESUR MODERATE* 01/09/2016 1404   Sepsis Labs: Invalid input(s): PROCALCITONIN, LACTICIDVEN  Recent Results (from the past 240 hour(s))  MRSA PCR Screening     Status: None   Collection Time: 01/31/16  4:59 PM  Result Value Ref Range Status   MRSA by PCR NEGATIVE NEGATIVE Final    Comment:        The GeneXpert MRSA Assay (FDA approved for NASAL specimens only), is one component of a comprehensive MRSA colonization surveillance program. It is not intended to diagnose MRSA infection nor to guide or monitor treatment for MRSA infections.     Radiology Studies: Dg Chest Port 1 View  02/05/2016  CLINICAL DATA:  Shortness of Breath. EXAM: PORTABLE CHEST 1 VIEW COMPARISON:  Chest x-ray 01/29/2016 FINDINGS: Right PICC line tip is in the distal SVC just above the cavoatrial junction. The heart is normal in size. The mediastinal and hilar contours are normal. The lungs are clear. Prominent skin fold noted over the right chest but no definite pneumothorax. Possible very small left pleural  effusion. The bony thorax is intact. IMPRESSION: No acute cardiopulmonary findings. Possible very small left pleural effusion. Right PICC line in good position. Electronically Signed   By: Rudie Meyer M.D.   On: 02/05/2016 20:40   Dg Abd Portable 1v  02/05/2016  CLINICAL DATA:  Abdominal pain. EXAM: PORTABLE ABDOMEN - 1 VIEW COMPARISON:  None. FINDINGS: The bowel gas pattern is unremarkable. No dilated bowel loops are present. A left common iliac venous stent is noted. No acute bony abnormalities noted. IMPRESSION: No acute abnormalities. Electronically Signed   By: Harmon Pier M.D.   On: 02/05/2016 20:40    Scheduled Meds: . sodium chloride   Intravenous Once  . apixaban  10 mg Oral BID   Followed by  . [START ON 02/11/2016] apixaban  5 mg Oral BID  . calcium carbonate  1 tablet Oral BID WC  . feeding supplement  1 Container Oral TID BM  . gabapentin  100 mg Oral BID  . levothyroxine  25 mcg Oral QAC breakfast  . pantoprazole  40 mg Oral Daily  . pneumococcal 23 valent vaccine  0.5 mL Intramuscular Tomorrow-1000  . venlafaxine  25 mg Oral BID   Continuous Infusions:   Signature  Lorretta Harp M.D on 02/05/2016 at 8:49 PM  Between 7am to 7pm - Pager - [phone], After 7pm go to [email or web address] - password [facility]  [facility]  - Office  [phone]

## 2016-02-05 NOTE — Evaluation (Signed)
Physical Therapy Evaluation Patient Details Name: [name] MRN: [id number] DOB: [birth year] Today's Date: 02/05/2016   History of Present Illness  56 y.o. female with multiple comorbidities including type 2 diabetes mellitus, hypothyroidism, severe protein calorie malnutrition, history of pulmonary embolism, presented to the emergency department 4/30 with complaints of Left lower extremity pain associated with edema and erythema x 2 days. Upon presentation to the ER she was found to have 10/10 pain to that leg with decreased mobility. She had bedside ultrasound in ED which demonstrated occlusive DVT throughout the L lower extremity and she was started on IV heparin. 5/2 she went to IR for venous catheter placement for thrombolysis, post-procedure she was transferred to ICU for continued lytics.    Clinical Impression  Patient presents with problems listed below.  Will benefit from acute PT to maximize functional mobility prior to discharge.  Patient requiring max encouragement to participate with PT.  Patient attempted to stand x1, and then demanded to return to bed for bedpan rather than use BSC.  Patient reports significant pain in LLE.  Requiring Mod assist for mobility/transfers.  Recommend SNF at d/c for continued therapy to allow patient to return home safely.    Follow Up Recommendations SNF;Supervision/Assistance - 24 hour    Equipment Recommendations  Wheelchair (measurements PT);Wheelchair cushion (measurements PT)    Recommendations for Other Services       Precautions / Restrictions Precautions Precautions: Fall Precaution Comments: knees buckle Restrictions Weight Bearing Restrictions: No      Mobility  Bed Mobility Overal bed mobility: Needs Assistance Bed Mobility: Sit to Supine       Sit to supine: Min assist   General bed mobility comments: Patient sitting at EOB holding LE's in extension.  Encouraged patient to bring her LE's onto bed.  Patient states "you  bring them on the bed".  Provided min assist to move LE's onto bed.  Patient then rolled to left side for bedpan.  Transfers Overall transfer level: Needs assistance Equipment used: Rolling walker (2 wheeled) Transfers: Sit to/from [employer] Sit to Stand: Mod assist Stand pivot transfers: Mod assist       General transfer comment: Verbal cues for hand placement.  "You are going to have to help me"  Provided moderate assist to power up to standing.  Patient yelled out, reporting LLE pain, and abruptly sat in chair.  Instructed patient to off-load LLE by using UE's on RW.  On initiation of standing attempt, patient reports need to go to bathroom.  Encouraged patient to try to go into bathroom - refused.  Encouraged use of BSC - refused.  Patient stated she was going to get back to bed to use bedpan.  Required mod assist to return to stance and pivot chair to bed.  Patient stating "You pull me up".  Declined further PT at this time.  Ambulation/Gait                Stairs            Wheelchair Mobility    Modified Rankin (Stroke Patients Only)       Balance                                             Pertinent Vitals/Pain Pain Assessment: Faces Faces Pain Scale: Hurts whole lot Pain Location: LLE Pain Descriptors / Indicators:  Aching;Sharp Pain Intervention(s): Limited activity within patient's tolerance;Monitored during session;Repositioned    Home Living Family/patient expects to be discharged to:: Private residence Living Arrangements: Children Available Help at Discharge: Family;Personal care attendant;Available PRN/intermittently Type of Home: Apartment Home Access: Stairs to enter Entrance Stairs-Rails: Right;Left Entrance Stairs-Number of Steps: 6 steps with L rail to landing, 6 steps with R rail to apartment Home Layout: One level Home Equipment: Environmental consultant - 2 wheels;Cane - single point;Bedside commode;Hospital bed;Tub  bench Additional Comments: Information from chart review    Prior Function Level of Independence: Needs assistance               Hand Dominance   Dominant Hand: Left    Extremity/Trunk Assessment   Upper Extremity Assessment: Generalized weakness           Lower Extremity Assessment: Generalized weakness;LLE deficits/detail   LLE Deficits / Details: Decreased strength due to pain     Communication   Communication: No difficulties  Cognition Arousal/Alertness: Awake/alert Behavior During Therapy: Anxious (Uncooperative at times) Overall Cognitive Status: Within Functional Limits for tasks assessed                      General Comments      Exercises        Assessment/Plan    PT Assessment Patient needs continued PT services  PT Diagnosis Difficulty walking;Generalized weakness;Acute pain   PT Problem List Decreased strength;Decreased activity tolerance;Decreased balance;Decreased mobility;Decreased knowledge of use of DME;Decreased safety awareness;Pain  PT Treatment Interventions DME instruction;Gait training;Functional mobility training;Therapeutic activities;Therapeutic exercise;Balance training;Patient/family education   PT Goals (Current goals can be found in the Care Plan section) Acute Rehab PT Goals Patient Stated Goal: None stated PT Goal Formulation: With patient Time For Goal Achievement: 02/12/16 Potential to Achieve Goals: Fair    Frequency Min 3X/week   Barriers to discharge   Unsure of assist at home    Co-evaluation               End of Session Equipment Utilized During Treatment: Gait belt Activity Tolerance: Patient limited by pain;Treatment limited secondary to agitation Patient left: in bed;with call bell/phone within reach (on bedpan) Nurse Communication: Mobility status (on bedpan)         Time: 1610-9604 PT Time Calculation (min) (ACUTE ONLY): 18 min   Charges:   PT Evaluation $PT Eval Moderate  Complexity: 1 Procedure     PT G CodesVena Austria Feb 07, 2016, 12:10 PM Durenda Hurt. Renaldo Fiddler, [facility] Acute Rehab Services Pager [phone]

## 2016-02-05 NOTE — Progress Notes (Signed)
Patient refused to get up in chair or ambulate.  Educated about the importance of this.  Will continue to offer.  Peri Maris, MBA, BSN, RN

## 2016-02-06 ENCOUNTER — Inpatient Hospital Stay (HOSPITAL_COMMUNITY): Payer: Medicare Other

## 2016-02-06 LAB — CBC
HEMATOCRIT: 25.2 % — AB (ref 36.0–46.0)
Hemoglobin: 8 g/dL — ABNORMAL LOW (ref 12.0–15.0)
MCH: 31.4 pg (ref 26.0–34.0)
MCHC: 31.7 g/dL (ref 30.0–36.0)
MCV: 98.8 fL (ref 78.0–100.0)
Platelets: 117 10*3/uL — ABNORMAL LOW (ref 150–400)
RBC: 2.55 MIL/uL — AB (ref 3.87–5.11)
RDW: 20.2 % — ABNORMAL HIGH (ref 11.5–15.5)
WBC: 10.7 10*3/uL — AB (ref 4.0–10.5)

## 2016-02-06 LAB — BASIC METABOLIC PANEL
Anion gap: 7 (ref 5–15)
CHLORIDE: 113 mmol/L — AB (ref 101–111)
CO2: 22 mmol/L (ref 22–32)
Calcium: 6.5 mg/dL — ABNORMAL LOW (ref 8.9–10.3)
Creatinine, Ser: 0.48 mg/dL (ref 0.44–1.00)
GFR calc Af Amer: >60 mL/min (ref >60)
GFR calc non Af Amer: >60 mL/min (ref >60)
Glucose, Bld: 79 mg/dL (ref 65–99)
POTASSIUM: 3.4 mmol/L — AB (ref 3.5–5.1)
SODIUM: 142 mmol/L (ref 135–145)

## 2016-02-06 LAB — PROTIME-INR
INR: 1.89 — AB (ref 0.00–1.49)
PROTHROMBIN TIME: 21.7 s — AB (ref 11.6–15.2)

## 2016-02-06 LAB — TROPONIN I
TROPONIN I: 0.06 ng/mL — AB (ref <0.031)
Troponin I: <0.03 ng/mL (ref <0.031)
Troponin I: <0.03 ng/mL (ref <0.031)

## 2016-02-06 LAB — APTT: APTT: 36 s (ref 24–37)

## 2016-02-06 LAB — PROCALCITONIN: Procalcitonin: 0.13 ng/mL

## 2016-02-06 LAB — LACTIC ACID, PLASMA
LACTIC ACID, VENOUS: 1.1 mmol/L (ref 0.5–2.0)
Lactic Acid, Venous: 2.5 mmol/L (ref 0.5–2.0)

## 2016-02-06 MED ORDER — SODIUM CHLORIDE 0.9 % IV SOLN
INTRAVENOUS | Status: DC
  Administered 2016-02-06: 08:00:00 via INTRAVENOUS

## 2016-02-06 MED ORDER — POLYETHYLENE GLYCOL 3350 17 G PO PACK
17.0000 g | PACK | Freq: Two times a day (BID) | ORAL | Status: DC
  Filled 2016-02-06 (×2): qty 1

## 2016-02-06 MED ORDER — CEFEPIME HCL 1 G IJ SOLR
1.0000 g | Freq: Three times a day (TID) | INTRAMUSCULAR | Status: DC
  Administered 2016-02-06: 1 g via INTRAVENOUS
  Filled 2016-02-06 (×3): qty 1

## 2016-02-06 MED ORDER — DEXTROSE 5 % IV SOLN
1.0000 g | INTRAVENOUS | Status: DC
  Administered 2016-02-06 – 2016-02-07 (×2): 1 g via INTRAVENOUS
  Filled 2016-02-06 (×3): qty 10

## 2016-02-06 MED ORDER — POTASSIUM CHLORIDE CRYS ER 20 MEQ PO TBCR
40.0000 meq | EXTENDED_RELEASE_TABLET | Freq: Once | ORAL | Status: AC
  Administered 2016-02-06: 40 meq via ORAL
  Filled 2016-02-06: qty 2

## 2016-02-06 MED ORDER — MAGNESIUM HYDROXIDE 400 MG/5ML PO SUSP
30.0000 mL | Freq: Two times a day (BID) | ORAL | Status: DC
  Administered 2016-02-06: 30 mL via ORAL
  Filled 2016-02-06 (×2): qty 30

## 2016-02-06 MED ORDER — METHYLNALTREXONE BROMIDE 12 MG/0.6ML ~~LOC~~ SOLN
12.0000 mg | SUBCUTANEOUS | Status: DC
  Administered 2016-02-06: 12 mg via SUBCUTANEOUS
  Filled 2016-02-06 (×3): qty 0.6

## 2016-02-06 MED ORDER — DOCUSATE SODIUM 100 MG PO CAPS
200.0000 mg | ORAL_CAPSULE | Freq: Two times a day (BID) | ORAL | Status: DC
  Administered 2016-02-06 – 2016-02-08 (×2): 200 mg via ORAL
  Filled 2016-02-06 (×2): qty 2

## 2016-02-06 MED ORDER — BISACODYL 5 MG PO TBEC
10.0000 mg | DELAYED_RELEASE_TABLET | Freq: Every day | ORAL | Status: DC
  Administered 2016-02-06: 10 mg via ORAL
  Filled 2016-02-06: qty 2

## 2016-02-06 MED ORDER — OXYCODONE HCL 5 MG PO TABS
5.0000 mg | ORAL_TABLET | Freq: Four times a day (QID) | ORAL | Status: DC | PRN
  Administered 2016-02-06 – 2016-02-08 (×8): 5 mg via ORAL
  Filled 2016-02-06 (×8): qty 1

## 2016-02-06 NOTE — Consult Note (Signed)
Reason for Consult:abd pain Referring Physician: Dr Arnell Asal is an 55 y.o. female.  HPI: 28 yof who has multiple medical issues was admitted 4/30 with finding of lle dvt for which she underwent thrombolysis.  She has been here since then.  She is on eliquis as of 5/6 after being on heparin.  She has history of crohns.   She has prior fibroid excision as only abdominal surgery. Last issues with crohns were over a couple years ago. This is different than prior crohns flares.  She developed abdominal pain that was diffuse yesterday morning. This did not abate and she let medical team know.  She is having bms, there is no blood. She is hungry.  She has no n/v. The pain is better this am. She underwent evaluation with labs that showed mildly elevated lactate and a noncontrast ct that is unremarkable.  We were asked to see her.   Lactate now normal  Past Medical History  Diagnosis Date  . Glaucoma   . Diabetes mellitus without complication ([place])   . Hypertension   . Neuropathy ([place])   . Crohn disease ([place])   . Asthma   . Pulmonary embolus ([place]) 05/24/2014  . Hypothyroidism   . GERD (gastroesophageal reflux disease)   . HLD (hyperlipidemia)   . C. difficile colitis   . Pancreatitis   . Asthma   . Tobacco abuse     Past Surgical History  Procedure Laterality Date  . Cesarean section    . Esophagogastroduodenoscopy N/A 05/21/2014    Procedure: ESOPHAGOGASTRODUODENOSCOPY (EGD);  Surgeon: Jeryl Columbia, MD;  Location: [facility] ENDOSCOPY;  Service: Endoscopy;  Laterality: N/A;  . Esophagogastroduodenoscopy N/A 03/02/2015    Procedure: ESOPHAGOGASTRODUODENOSCOPY (EGD);  Surgeon: Ronald Lobo, MD;  Location: [facility] ENDOSCOPY;  Service: Endoscopy;  Laterality: N/A;  . Colonoscopy N/A 03/02/2015    Procedure: COLONOSCOPY;  Surgeon: Ronald Lobo, MD;  Location: [facility] ENDOSCOPY;  Service: Endoscopy;  Laterality: N/A;    Family History  Problem Relation Age of Onset  . Breast cancer Mother   . Heart  disease Mother 64    CABG  . Diabetes Mother   . Heart disease Father 24    CABG  . Diabetes Father     Social History:  reports that she has been smoking Cigarettes.  She has a 6.5 pack-year smoking history. She has never used smokeless tobacco. She reports that she does not drink alcohol or use illicit drugs.  Allergies:  Allergies  Allergen Reactions  . Iohexol Other (See Comments)    "severe burning" Patient has received Contrast in 2005 with 13 hour pre-medication, and had no reaction at that time  . Spiriva Handihaler [Tiotropium Bromide Monohydrate] Nausea And Vomiting  . Ativan [Lorazepam] Other (See Comments)    hallucinations  . Penicillins Hives    Medications: I have reviewed the patient's current medications.  Results for orders placed or performed during the hospital encounter of 01/29/16 (from the past 48 hour(s))  Procalcitonin     Status: None   Collection Time: 02/05/16 12:40 AM  Result Value Ref Range   Procalcitonin 0.13 ng/mL    Comment:        Interpretation: PCT (Procalcitonin) <= 0.5 ng/mL: Systemic infection (sepsis) is not likely. Local bacterial infection is possible. (NOTE)         ICU PCT Algorithm               Non ICU PCT Algorithm    ----------------------------     ------------------------------  PCT < 0.25 ng/mL                 PCT < 0.1 ng/mL     Stopping of antibiotics            Stopping of antibiotics       strongly encouraged.               strongly encouraged.    ----------------------------     ------------------------------       PCT level decrease by               PCT < 0.25 ng/mL       >= 80% from peak PCT       OR PCT 0.25 - 0.5 ng/mL          Stopping of antibiotics                                             encouraged.     Stopping of antibiotics           encouraged.    ----------------------------     ------------------------------       PCT level decrease by              PCT >= 0.25 ng/mL       < 80% from peak  PCT        AND PCT >= 0.5 ng/mL            Continuin g antibiotics                                              encouraged.       Continuing antibiotics            encouraged.    ----------------------------     ------------------------------     PCT level increase compared          PCT > 0.5 ng/mL         with peak PCT AND          PCT >= 0.5 ng/mL             Escalation of antibiotics                                          strongly encouraged.      Escalation of antibiotics        strongly encouraged.   Protime-INR     Status: Abnormal   Collection Time: 02/05/16 12:40 AM  Result Value Ref Range   Prothrombin Time 21.7 (H) 11.6 - 15.2 seconds   INR 1.89 (H) 0.00 - 1.49  APTT     Status: None   Collection Time: 02/05/16 12:40 AM  Result Value Ref Range   aPTT 36 24 - 37 seconds  CBC     Status: Abnormal   Collection Time: 02/05/16  6:20 AM  Result Value Ref Range   WBC 10.8 (H) 4.0 - 10.5 K/uL   RBC 2.85 (L) 3.87 - 5.11 MIL/uL   Hemoglobin 8.7 (L) 12.0 - 15.0 g/dL   HCT 28.0 (L) 36.0 - 46.0 %  MCV 98.2 78.0 - 100.0 fL   MCH 30.5 26.0 - 34.0 pg   MCHC 31.1 30.0 - 36.0 g/dL   RDW 21.2 (H) 11.5 - 15.5 %   Platelets 142 (L) 150 - 400 K/uL  CBC     Status: Abnormal   Collection Time: 02/05/16  8:05 PM  Result Value Ref Range   WBC 11.6 (H) 4.0 - 10.5 K/uL   RBC 2.89 (L) 3.87 - 5.11 MIL/uL   Hemoglobin 8.7 (L) 12.0 - 15.0 g/dL   HCT 28.2 (L) 36.0 - 46.0 %   MCV 97.6 78.0 - 100.0 fL   MCH 30.1 26.0 - 34.0 pg   MCHC 30.9 30.0 - 36.0 g/dL   RDW 20.5 (H) 11.5 - 15.5 %   Platelets 141 (L) 150 - 400 K/uL  Comprehensive metabolic panel     Status: Abnormal   Collection Time: 02/05/16  8:05 PM  Result Value Ref Range   Sodium 142 135 - 145 mmol/L   Potassium 4.0 3.5 - 5.1 mmol/L    Comment: DELTA CHECK NOTED   Chloride 111 101 - 111 mmol/L   CO2 23 22 - 32 mmol/L   Glucose, Bld 103 (H) 65 - 99 mg/dL   BUN <5 (L) 6 - 20 mg/dL   Creatinine, Ser 0.62 0.44 - 1.00 mg/dL    Calcium 7.4 (L) 8.9 - 10.3 mg/dL   Total Protein 4.3 (L) 6.5 - 8.1 g/dL   Albumin 1.5 (L) 3.5 - 5.0 g/dL   AST 28 15 - 41 U/L   ALT 23 14 - 54 U/L   Alkaline Phosphatase 96 38 - 126 U/L   Total Bilirubin 0.5 0.3 - 1.2 mg/dL   GFR calc non Af Amer >60 >60 mL/min   GFR calc Af Amer >60 >60 mL/min    Comment: (NOTE) The eGFR has been calculated using the CKD EPI equation. This calculation has not been validated in all clinical situations. eGFR's persistently <60 mL/min signify possible Chronic Kidney Disease.    Anion gap 8 5 - 15  Lipase, blood     Status: None   Collection Time: 02/05/16  8:05 PM  Result Value Ref Range   Lipase 12 11 - 51 U/L  Troponin I (q 6hr x 3)     Status: None   Collection Time: 02/05/16  9:10 PM  Result Value Ref Range   Troponin I <0.03 <0.031 ng/mL    Comment:        NO INDICATION OF MYOCARDIAL INJURY.   Lactic acid, plasma     Status: Abnormal   Collection Time: 02/05/16  9:14 PM  Result Value Ref Range   Lactic Acid, Venous 2.7 (HH) 0.5 - 2.0 mmol/L    Comment: CRITICAL RESULT CALLED TO, READ BACK BY AND VERIFIED WITH: GENGLER,K RN 02/05/2016 2154 JORDANS   Urinalysis, Routine w reflex microscopic (not at [facility])     Status: Abnormal   Collection Time: 02/05/16 10:59 PM  Result Value Ref Range   Color, Urine YELLOW YELLOW   APPearance CLEAR CLEAR   Specific Gravity, Urine 1.009 1.005 - 1.030   pH 5.5 5.0 - 8.0   Glucose, UA NEGATIVE NEGATIVE mg/dL   Hgb urine dipstick TRACE (A) NEGATIVE   Bilirubin Urine NEGATIVE NEGATIVE   Ketones, ur NEGATIVE NEGATIVE mg/dL   Protein, ur NEGATIVE NEGATIVE mg/dL   Nitrite NEGATIVE NEGATIVE   Leukocytes, UA SMALL (A) NEGATIVE  Urine microscopic-add on     Status: Abnormal  Collection Time: 02/05/16 10:59 PM  Result Value Ref Range   Squamous Epithelial / LPF 0-5 (A) NONE SEEN   WBC, UA 6-30 0 - 5 WBC/hpf   RBC / HPF 0-5 0 - 5 RBC/hpf   Bacteria, UA RARE (A) NONE SEEN  Lactic acid, plasma     Status:  Abnormal   Collection Time: 02/05/16 11:47 PM  Result Value Ref Range   Lactic Acid, Venous 2.5 (HH) 0.5 - 2.0 mmol/L    Comment: CRITICAL RESULT CALLED TO, READ BACK BY AND VERIFIED WITH: CASTRO,E RN 02/06/2016 0034 JORDANS     Ct Abdomen Pelvis Wo Contrast  02/06/2016  CLINICAL DATA:  Abdominal pain, diffuse. EXAM: CT ABDOMEN AND PELVIS WITHOUT CONTRAST TECHNIQUE: Multidetector CT imaging of the abdomen and pelvis was performed following the standard protocol without IV contrast. COMPARISON:  Radiographs 02/05/2016.  CT 01/09/2016. FINDINGS: In the lower chest, there is posterior left lung base opacity which may be atelectatic but infectious infiltrate cannot be excluded. There is a 2 cm mass and the liver dome, present over the past several examinations dating back to at least 07/30/2011. There are otherwise unremarkable unenhanced appearances of the liver and bile ducts. There are unremarkable unenhanced appearances of the spleen, pancreas, adrenals and renal parenchyma. Multiple 2-3 mm collecting system calculi are present in both kidneys but there is no evidence of ureteral obstruction. There is no ureteral calculus. Urinary bladder is nearly empty, with a Foley catheter, and appears grossly unremarkable. The abdominal aorta is normal in caliber with mild atherosclerotic calcification. There are vascular stents within the left common iliac vein. The colon is moderately distended with air and stool. No evidence of bowel obstruction. Unremarkable small bowel and stomach. No acute inflammatory changes are evident in the abdomen or pelvis. There is no adenopathy. There is no ascites. There is no significant musculoskeletal lesion. IMPRESSION: 1. Moderate colonic distention with stool and air. Negative for bowel obstruction or perforation 2. No acute inflammatory changes are evident in the abdomen or pelvis. 3. Left lung base atelectasis or infiltrate. 4. Bilateral nephrolithiasis. No ureteral calculus. No  evidence of ureteral obstruction Electronically Signed   By: Andreas Newport M.D.   On: 02/06/2016 02:27   Dg Chest Port 1 View  02/05/2016  CLINICAL DATA:  Shortness of Breath. EXAM: PORTABLE CHEST 1 VIEW COMPARISON:  Chest x-ray 01/29/2016 FINDINGS: Right PICC line tip is in the distal SVC just above the cavoatrial junction. The heart is normal in size. The mediastinal and hilar contours are normal. The lungs are clear. Prominent skin fold noted over the right chest but no definite pneumothorax. Possible very small left pleural effusion. The bony thorax is intact. IMPRESSION: No acute cardiopulmonary findings. Possible very small left pleural effusion. Right PICC line in good position. Electronically Signed   By: Marijo Sanes M.D.   On: 02/05/2016 20:40   Dg Abd Portable 1v  02/05/2016  CLINICAL DATA:  Abdominal pain. EXAM: PORTABLE ABDOMEN - 1 VIEW COMPARISON:  None. FINDINGS: The bowel gas pattern is unremarkable. No dilated bowel loops are present. A left common iliac venous stent is noted. No acute bony abnormalities noted. IMPRESSION: No acute abnormalities. Electronically Signed   By: Margarette Canada M.D.   On: 02/05/2016 20:40    Review of Systems  Constitutional: Negative for fever and chills.  Respiratory: Negative for cough.   Cardiovascular: Negative for chest pain.  Skin: Negative for itching.   Blood pressure 110/70, pulse 90, temperature 98.8 F (  37.1 C), temperature source Oral, resp. rate 20, height 5' 5"  (1.651 m), weight 52.8 kg (116 lb 6.5 oz), last menstrual period 10/01/2010, SpO2 100 %. Physical Exam  Vitals reviewed. Constitutional: She appears well-developed and well-nourished.  Cardiovascular: Normal rate and regular rhythm.   Respiratory: Effort normal and breath sounds normal.  GI: There is tenderness (mild bilateral lower quadrant).      Assessment/Plan: Abdominal pain  noncontrast ct scan not the best for evaluation but on labs (lactate resolved ) and imaging  plus her exam is better according to her there is no indication for surgery.  May need follow up for crohns at some point.  Please call back if needed  [facility] 02/06/2016, 6:52 AM

## 2016-02-06 NOTE — Care Management Note (Signed)
Case Management Note  Patient Details  Name: [name] MRN: [id number] Date of Birth: Aug 25, 1961  Subjective/Objective:     DVT, GI Bleed               Action/Plan: Discharge Planning:  NCM spoke pt and offered choice/provided [facility] list. Pt requested [facility]. Contacted [facility] Liaison to arrange [facility] RN and PT. Will need HH orders with F2F. Pt requesting 3n1 bedside commode. Will have NCM order DME from [facility] on 02/07/2016. Pt states her son, [name] at home to assist with her care. Provided pt with Eliquis 30 day free trial card and explained to take with her to pharmacy. [facility] T[facility] consult, pt had 1 Ed admission in 6 months, and 1 admission in 6 months. Left message for [facility] Liaison, Erskine Squibb for follow up appt. Pt has Medicaid, and Eliquis is a Medicaid covered medication.   PCP-none   Expected Discharge Date:  02/07/2016             Expected Discharge Plan:  Home w Home Health Services  In-House Referral:  Clinical Social Work  Discharge planning Services  CM Consult  Post Acute Care Choice:  Home Health Choice offered to:  Patient  DME Arranged:  3-N-1 DME Agency:  [facility]  HH Arranged:  RN HH Agency:  [facility]  Status of Service:  In process, will continue to follow  Medicare Important Message Given:  Yes Date Medicare IM Given:    Medicare IM give by:    Date Additional Medicare IM Given:    Additional Medicare Important Message give by:     If discussed at Long Length of Stay Meetings, dates discussed:    Additional Comments:  Elliot Cousin, RN 02/06/2016, 6:42 PM

## 2016-02-06 NOTE — Progress Notes (Signed)
PT Cancellation Note  Patient Details Name: [name] MRN: [id number] DOB: [birth year]   Cancelled Treatment:    Reason Eval/Treat Not Completed: Other (comment)  Refusing OOB at this time;   Will make every effort to return early this afternoon;  Van Clines, PT  Acute Rehabilitation Services Pager [phone] Office [phone]    Van Clines [facility] 02/06/2016, 9:14 AM

## 2016-02-06 NOTE — Progress Notes (Signed)
PROGRESS NOTE  [name] ZOX:[id number] DOB: March 24, 1961 DOA: 01/29/2016 PCP: No primary care provider on file. Outpatient Specialists:    LOS: 8 days   Brief Narrative:  55 y.o. female with multiple comorbidities including type 2 diabetes mellitus, hypothyroidism, severe protein calorie malnutrition, history of pulmonary embolism, presented to the emergency department 4/30 with complaints of Left lower extremity pain associated with edema and erythema x 2 days. Upon presentation to the ER she was found to have 10/10 pain to that leg with decreased mobility. She had bedside ultrasound in ED which demonstrated occlusive DVT throughout the L lower extremity and she was started on IV heparin. 5/2 she went to IR for venous catheter placement for thrombolysis, post-procedure she was transferred to ICU for continued lytics. Once procedure was complete, she was transferred to floor and TRH took over 5/5.  Note patient has been very uncooperative with the staff, PT and nursing, she is refusing to get out of the bed or sit up in the recliner, refusing to participate with PT, every day she becomes tearful when it's time to go home or she is told she is being discharged. On the night of 02/05/2016 when she was told she is going home next morning she started complaining of abdominal pain, workup showed only large stool burden in her colon. Morning of 02/06/2016 she is eating breakfast comfortably appears to be in no distress with benign abdominal exam. She has been told again and again that she will be discharged finally on 02/07/2016.   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 She has prior history of PE, was off of anticoagulation for the past year as she completed course. Tolerated Eliquis well in the past, have transitioned her from heparin drip to  Eliquis on 02/04/2016, will monitor H&H.  Anemia - drop in Hb to 6.8 on 5/3 without clinical bleeding, s/p 1U pRBC, Hb 9.1 >> 8.9, hemoglobin stable will monitor.   Shortness of breath. - On admission she reported having some shortness of breath and was tachycardic in the emergency department ,  Initially ordered a CT PE study however was informed of her contrast allergy. Ordered a VQ scan however she was unable to tolerate procedure. Transthoracic echocardiogram as below, normal EF, RV looks good. Symptoms have  Resolved.  History of GI bleed - She had been hospitalized in June 2016 for GI bleed while on Eliquis tx. Aparently this was in the setting of Crohn's colitis.She has had no issues with GI bleed in the last 2 years, continue to monitor closely. We'll have her follow with her GI physician post discharge. Her Crohn's is currently in remission for the last 2 years.    Elevated transaminases. - Presented with elevated AST, ALT and alkaline phosphatase. She has a history elevated liver enzymes. - She had a recent CT of abdomen performed on 01/09/2016 that revealed hepatic steatosis. Follow with GI outpatient. Liver enzymes trending down.    Hypertension - Blood pressure normal, will continue holding antihypertensive agents   Abdominal pain night of 02/05/2016. CT abdomen and pelvis shows large stone burden suggestive of narcotic bowel, patient takes narcotics regularly at home, initiate bowel regimen, having breakfast this morning appears comfortable, exam benign. No further workup.   1 isolated borderline troponin of 0.06. EKG nonacute. This was obtained on 02/05/2016 night for abdominal pain.  Recent echo unremarkable. On Eliquis continue, trend troponin. No chest pain.    DVT prophylaxis: heparin/Eliquis Code Status: Full Family Communication: no family bedside Disposition Plan: home vs SNF when ready. PT eval pending Barriers for discharge:   On 02/05/2016  - PT eval, patient  refused to go today, refused to work with PT, refused to sit up in the recliner on 02/04/2016. On 02/05/2016 she refused to go home and requested me to keep her here one more day for PT eval. She promised she would cooperate today and will go home tomorrow.  Consultants:   PCCM  IR  Procedures:    2D echo  - Left ventricle: The cavity size was normal. Systolic function was normal. The estimated ejection fraction was in the range of 55% to 60%. Wall motion was normal; there were no regional wall motion abnormalities. Doppler parameters are consistent with abnormal left ventricular relaxation (grade 1 diastolic dysfunction). Acoustic contrast opacification revealed no evidence of thrombus.  Antimicrobials:  None    Subjective: - no complaints this morning, no chest pain, no dyspnea, no abdominal pain, nausea/vomiting, improved L.Leg pain  Objective: Filed Vitals:   02/05/16 1833 02/05/16 1920 02/05/16 2214 02/06/16 0539  BP: 120/64 106/71 119/83 110/70  Pulse:  118 105 90  Temp: 98.6 F (37 C) 99 F (37.2 C) 98.2 F (36.8 C) 98.8 F (37.1 C)  TempSrc: Oral Oral Oral Oral  Resp: 20 22 20 20   Height:      Weight:      SpO2: 100% 98% 98% 100%    Intake/Output Summary (Last 24 hours) at 02/06/16 0938 Last data filed at 02/06/16 7026  Gross per 24 hour  Intake    270 ml  Output      0 ml  Net    270 ml   Filed Weights   02/01/16 0400 02/04/16 0528 02/05/16 0546  Weight: 50.7 kg (111 lb 12.4 oz) 48.8 kg (107 lb 9.4 oz) 52.8 kg (116 lb 6.5 oz)    Examination:  Filed Vitals:   02/05/16 1833 02/05/16 1920 02/05/16 2214 02/06/16 0539  BP: 120/64 106/71 119/83 110/70  Pulse:  118 105 90  Temp: 98.6 F (37 C) 99 F (37.2 C) 98.2 F (36.8 C) 98.8 F (37.1 C)  TempSrc: Oral Oral Oral Oral  Resp: 20 22 20 20   Height:      Weight:      SpO2: 100% 98% 98% 100%   Constitutional: NAD Eyes: PERRL, lids and conjunctivae normal ENMT: Mucous membranes are moist. No  oropharyngeal exudates Respiratory: clear to auscultation bilaterally, no wheezing, no crackles. Normal respiratory effort.  Cardiovascular: Regular rate and rhythm, no murmurs / rubs / gallops. No extremity edema. 2+ pedal pulses. Abdomen: no tenderness. Bowel sounds positive.  Neurologic: non focal  Psychiatric: Normal judgment and insight. Alert and oriented x 3. Normal mood.     Data Reviewed: I have personally reviewed following labs and imaging studies  CBC:  Recent Labs Lab 01/31/16 0858  02/03/16 0650 02/04/16 0455 02/05/16 0620 02/05/16 2005 02/06/16 0612  WBC 9.5  < > 11.0* 10.5 10.8* 11.6* 10.7*  NEUTROABS 8.5*  --   --   --   --   --   --   HGB 8.5*  < > 8.9* 9.0* 8.7* 8.7* 8.0*  HCT 26.1*  < > 27.7* 27.6* 28.0* 28.2* 25.2*  MCV 97.0  < > 96.9 97.2 98.2 97.6 98.8  PLT 151  < > 125* 132*  142* 141* 117*  < > = values in this interval not displayed. Basic Metabolic Panel:  Recent Labs Lab 02/01/16 0400 02/02/16 0518 02/04/16 0455 02/05/16 2005 02/06/16 0612  NA 136 135 146* 142 142  K 4.1 3.7 3.1* 4.0 3.4*  CL 106 107 114* 111 113*  CO2 20* 21* 23 23 22   GLUCOSE 157* 135* 85 103* 79  BUN 8 8 <5* <5* <5*  CREATININE 0.75 0.68 0.69 0.62 0.48  CALCIUM 7.4* 7.3* 7.3* 7.4* 6.5*   GFR: Estimated Creatinine Clearance: 64.7 mL/min (by C-G formula based on Cr of 0.48). Liver Function Tests:  Recent Labs Lab 01/31/16 0828 02/01/16 0400 02/05/16 2005  AST 172* 89* 28  ALT 60* 49 23  ALKPHOS 190* 157* 96  BILITOT 1.0 1.3* 0.5  PROT 5.0* 4.7* 4.3*  ALBUMIN 1.7* 1.6* 1.5*   Coagulation Profile:  Recent Labs Lab 01/31/16 0541 02/02/16 0518 02/05/16 0040  INR 1.31 1.25 1.89*   Cardiac Enzymes:  Recent Labs Lab 01/31/16 1430 02/05/16 2110 02/06/16 0612  CKTOTAL 116  --   --   TROPONINI  --  <0.03 0.06*   CBG:  Recent Labs Lab 02/03/16 0753 02/03/16 0848 02/03/16 1131 02/03/16 1707 02/03/16 1950  GLUCAP 58* 66 77 105* 92   Urine  analysis:    Component Value Date/Time   COLORURINE YELLOW 02/05/2016 2259   APPEARANCEUR CLEAR 02/05/2016 2259   LABSPEC 1.009 02/05/2016 2259   PHURINE 5.5 02/05/2016 2259   GLUCOSEU NEGATIVE 02/05/2016 2259   HGBUR TRACE* 02/05/2016 2259   BILIRUBINUR NEGATIVE 02/05/2016 2259   KETONESUR NEGATIVE 02/05/2016 2259   PROTEINUR NEGATIVE 02/05/2016 2259   UROBILINOGEN 1.0 02/28/2015 1227   NITRITE NEGATIVE 02/05/2016 2259   LEUKOCYTESUR SMALL* 02/05/2016 2259   Sepsis Labs: Invalid input(s): PROCALCITONIN, LACTICIDVEN  Recent Results (from the past 240 hour(s))  MRSA PCR Screening     Status: None   Collection Time: 01/31/16  4:59 PM  Result Value Ref Range Status   MRSA by PCR NEGATIVE NEGATIVE Final    Comment:        The GeneXpert MRSA Assay (FDA approved for NASAL specimens only), is one component of a comprehensive MRSA colonization surveillance program. It is not intended to diagnose MRSA infection nor to guide or monitor treatment for MRSA infections.     Radiology Studies: Ct Abdomen Pelvis Wo Contrast  02/06/2016  CLINICAL DATA:  Abdominal pain, diffuse. EXAM: CT ABDOMEN AND PELVIS WITHOUT CONTRAST TECHNIQUE: Multidetector CT imaging of the abdomen and pelvis was performed following the standard protocol without IV contrast. COMPARISON:  Radiographs 02/05/2016.  CT 01/09/2016. FINDINGS: In the lower chest, there is posterior left lung base opacity which may be atelectatic but infectious infiltrate cannot be excluded. There is a 2 cm mass and the liver dome, present over the past several examinations dating back to at least 07/30/2011. There are otherwise unremarkable unenhanced appearances of the liver and bile ducts. There are unremarkable unenhanced appearances of the spleen, pancreas, adrenals and renal parenchyma. Multiple 2-3 mm collecting system calculi are present in both kidneys but there is no evidence of ureteral obstruction. There is no ureteral calculus.  Urinary bladder is nearly empty, with a Foley catheter, and appears grossly unremarkable. The abdominal aorta is normal in caliber with mild atherosclerotic calcification. There are vascular stents within the left common iliac vein. The colon is moderately distended with air and stool. No evidence of bowel obstruction. Unremarkable small bowel and stomach. No acute inflammatory changes  are evident in the abdomen or pelvis. There is no adenopathy. There is no ascites. There is no significant musculoskeletal lesion. IMPRESSION: 1. Moderate colonic distention with stool and air. Negative for bowel obstruction or perforation 2. No acute inflammatory changes are evident in the abdomen or pelvis. 3. Left lung base atelectasis or infiltrate. 4. Bilateral nephrolithiasis. No ureteral calculus. No evidence of ureteral obstruction Electronically Signed   By: Ellery Plunk M.D.   On: 02/06/2016 02:27   Dg Chest Port 1 View  02/05/2016  CLINICAL DATA:  Shortness of Breath. EXAM: PORTABLE CHEST 1 VIEW COMPARISON:  Chest x-ray 01/29/2016 FINDINGS: Right PICC line tip is in the distal SVC just above the cavoatrial junction. The heart is normal in size. The mediastinal and hilar contours are normal. The lungs are clear. Prominent skin fold noted over the right chest but no definite pneumothorax. Possible very small left pleural effusion. The bony thorax is intact. IMPRESSION: No acute cardiopulmonary findings. Possible very small left pleural effusion. Right PICC line in good position. Electronically Signed   By: Rudie Meyer M.D.   On: 02/05/2016 20:40   Dg Abd Portable 1v  02/05/2016  CLINICAL DATA:  Abdominal pain. EXAM: PORTABLE ABDOMEN - 1 VIEW COMPARISON:  None. FINDINGS: The bowel gas pattern is unremarkable. No dilated bowel loops are present. A left common iliac venous stent is noted. No acute bony abnormalities noted. IMPRESSION: No acute abnormalities. Electronically Signed   By: Harmon Pier M.D.   On:  02/05/2016 20:40    Scheduled Meds: . apixaban  10 mg Oral BID   Followed by  . [START ON 02/11/2016] apixaban  5 mg Oral BID  . bisacodyl  10 mg Oral Daily  . calcium carbonate  1 tablet Oral BID WC  . cefTRIAXone (ROCEPHIN)  IV  1 g Intravenous Q24H  . docusate sodium  200 mg Oral BID  . feeding supplement  1 Container Oral TID BM  . gabapentin  100 mg Oral BID  . levothyroxine  25 mcg Oral QAC breakfast  . magnesium hydroxide  30 mL Oral BID  . methylnaltrexone  12 mg Subcutaneous QODAY  . pantoprazole  40 mg Oral Daily  . pneumococcal 23 valent vaccine  0.5 mL Intramuscular Tomorrow-1000  . polyethylene glycol  17 g Oral BID  . potassium chloride  40 mEq Oral Once  . venlafaxine  25 mg Oral BID   Continuous Infusions:   Signature  Susa Raring K M.D on 02/06/2016 at 9:38 AM  Between 7am to 7pm - Pager - [phone], After 7pm go to [email or web address] - password [facility]  [facility]  - Office  [phone]

## 2016-02-06 NOTE — Progress Notes (Signed)
Pharmacy Antibiotic Note [name] is a 55 y.o. female admitted on 01/29/2016 with pneumonia from CTA on 5/8.  Pharmacy has been consulted for Cefepime dosing.  Plan: 1. Begin Cefepime 1 gram IV every 8 hours  2. Await cx data, clinical response and narrow abx as feasible   Height:  (165.1 cm) Weight: 116 lb 6.5 oz (52.8 kg) IBW/kg (Calculated) : 57  Temp (24hrs), Avg:98.5 F (36.9 C), Min:98.1 F (36.7 C), Max:99 F (37.2 C)   Recent Labs Lab 01/31/16 0828  02/01/16 0400 02/02/16 0518 02/02/16 1653 02/03/16 0650 02/04/16 0455 02/05/16 0620 02/05/16 2005 02/05/16 2114 02/05/16 2347  WBC  --   < > 10.5 7.6 11.4* 11.0* 10.5 10.8* 11.6*  --   --   CREATININE 0.84  --  0.75 0.68  --   --  0.69  --  0.62  --   --   LATICACIDVEN  --   --   --   --   --   --   --   --   --  2.7* 2.5*  < > = values in this interval not displayed.  Estimated Creatinine Clearance: 64.7 mL/min (by C-G formula based on Cr of 0.62).    Allergies  Allergen Reactions  . Iohexol Other (See Comments)    "severe burning" Patient has received Contrast in 2005 with 13 hour pre-medication, and had no reaction at that time  . Spiriva Handihaler [Tiotropium Bromide Monohydrate] Nausea And Vomiting  . Ativan [Lorazepam] Other (See Comments)    hallucinations  . Penicillins Hives   Antimicrobials this admission: 5/8 Cefepime >>   Dose adjustments this admission: n/a  Microbiology results: 5/8 BCx: px 5/8 MRSA PCR: negative   Thank you for allowing pharmacy to be a part of this patient's care.  Sheron Nightingale 02/06/2016 4:35 AM

## 2016-02-06 NOTE — Clinical Social Work Note (Signed)
CSW visited with patient to discuss discharge plans and recommendation of rehab by physical therapy and MD. Ms. [name] was lying in bed and reluctantly allowed CSW to enter and talk with her. Patient's back was to CSW and eyes closed during conversation. Ms. [name] adamantly declined SNF for rehab indicating that she was not going to another facility. When asked, Ms. [name] reported that someone lives with her and there would be someone with her at home, but would not provide any other information, i.e name or relationship. MD contacted and updated. CSW signing off as patient is declining SNF placement, however please reconsult if any other SW services needed or requested.  Genelle Bal, MSW, LCSW Licensed Clinical Social Worker Clinical Social Work Department [employer] [phone]

## 2016-02-06 NOTE — Progress Notes (Signed)
PT Cancellation Note  Patient Details Name: [name] MRN: [id number] DOB: April 30, 1961   Cancelled Treatment:    Reason Eval/Treat Not Completed: Patient declined, no reason specified   Plan to try again to work with [name] tomorrow;   Van Clines, PT  Acute Rehabilitation Services Pager [phone] Office [phone]    Van Clines [facility] 02/06/2016, 1:57 PM

## 2016-02-06 NOTE — Discharge Instructions (Addendum)
Follow with Primary MD in 2-3 days   Get CBC, CMP, 2 view Chest X ray checked  by Primary MD next visit.    Activity: As tolerated with Full fall precautions use walker/cane & assistance as needed   Disposition Home     Diet:   Heart Healthy  .  For Heart failure patients - Check your Weight same time everyday, if you gain over 2 pounds, or you develop in leg swelling, experience more shortness of breath or chest pain, call your Primary MD immediately. Follow Cardiac Low Salt Diet and 1.5 lit/day fluid restriction.   On your next visit with your primary care physician please Get Medicines reviewed and adjusted.   Please request your Prim.MD to go over all Hospital Tests and Procedure/Radiological results at the follow up, please get all Hospital records sent to your Prim MD by signing hospital release before you go home.   If you experience worsening of your admission symptoms, develop shortness of breath, life threatening emergency, suicidal or homicidal thoughts you must seek medical attention immediately by calling 911 or calling your MD immediately  if symptoms less severe.  You Must read complete instructions/literature along with all the possible adverse reactions/side effects for all the Medicines you take and that have been prescribed to you. Take any new Medicines after you have completely understood and accpet all the possible adverse reactions/side effects.   Do not drive, operating heavy machinery, perform activities at heights, swimming or participation in water activities or provide baby sitting services if your were admitted for syncope or siezures until you have seen by Primary MD or a Neurologist and advised to do so again.  Do not drive when taking Pain medications.    Do not take more than prescribed Pain, Sleep and Anxiety Medications  Special Instructions: If you have smoked or chewed Tobacco  in the last 2 yrs please stop smoking, stop any regular Alcohol  and  or any Recreational drug use.  Wear Seat belts while driving.   Please note  You were cared for by a hospitalist during your hospital stay. If you have any questions about your discharge medications or the care you received while you were in the hospital after you are discharged, you can call the unit and asked to speak with the hospitalist on call if the hospitalist that took care of you is not available. Once you are discharged, your primary care physician will handle any further medical issues. Please note that NO REFILLS for any discharge medications will be authorized once you are discharged, as it is imperative that you return to your primary care physician (or establish a relationship with a primary care physician if you do not have one) for your aftercare needs so that they can reassess your need for medications and monitor your lab values.     Information on my medicine - ELIQUIS (apixaban)  This medication education was reviewed with me or my healthcare representative as part of my discharge preparation.  Why was Eliquis prescribed for you? Eliquis was prescribed to treat blood clots that may have been found in the veins of your legs (deep vein thrombosis) or in your lungs (pulmonary embolism) and to reduce the risk of them occurring again.  What do You need to know about Eliquis ? The starting dose is 10 mg (two 5 mg tablets) taken TWICE daily for the FIRST SEVEN (7) DAYS, then on (enter date)  02/11/2016  the dose is reduced to ONE  5 mg tablet taken TWICE daily.  Eliquis may be taken with or without food.   Try to take the dose about the same time in the morning and in the evening. If you have difficulty swallowing the tablet whole please discuss with your pharmacist how to take the medication safely.  Take Eliquis exactly as prescribed and DO NOT stop taking Eliquis without talking to the doctor who prescribed the medication.  Stopping may increase your risk of developing  a new blood clot.  Refill your prescription before you run out.  After discharge, you should have regular check-up appointments with your healthcare provider that is prescribing your Eliquis.    What do you do if you miss a dose? If a dose of ELIQUIS is not taken at the scheduled time, take it as soon as possible on the same day and twice-daily administration should be resumed. The dose should not be doubled to make up for a missed dose.  Important Safety Information A possible side effect of Eliquis is bleeding. You should call your healthcare provider right away if you experience any of the following: ? Bleeding from an injury or your nose that does not stop. ? Unusual colored urine (red or dark brown) or unusual colored stools (red or black). ? Unusual bruising for unknown reasons. ? A serious fall or if you hit your head (even if there is no bleeding).  Some medicines may interact with Eliquis and might increase your risk of bleeding or clotting while on Eliquis. To help avoid this, consult your healthcare provider or pharmacist prior to using any new prescription or non-prescription medications, including herbals, vitamins, non-steroidal anti-inflammatory drugs (NSAIDs) and supplements.  This website has more information on Eliquis (apixaban): http://www.eliquis.com/eliquis/home

## 2016-02-07 LAB — HEPATITIS PANEL, ACUTE
HEP B S AG: NEGATIVE
Hep A IgM: NEGATIVE
Hep B C IgM: NEGATIVE

## 2016-02-07 LAB — FACTOR 5 LEIDEN

## 2016-02-07 LAB — BASIC METABOLIC PANEL
ANION GAP: 9 (ref 5–15)
BUN: <5 mg/dL — ABNORMAL LOW (ref 6–20)
CHLORIDE: 114 mmol/L — AB (ref 101–111)
CO2: 20 mmol/L — AB (ref 22–32)
Calcium: 7 mg/dL — ABNORMAL LOW (ref 8.9–10.3)
Creatinine, Ser: 0.52 mg/dL (ref 0.44–1.00)
GFR calc non Af Amer: >60 mL/min (ref >60)
Glucose, Bld: 63 mg/dL — ABNORMAL LOW (ref 65–99)
Potassium: 4 mmol/L (ref 3.5–5.1)
Sodium: 143 mmol/L (ref 135–145)

## 2016-02-07 LAB — HEMOGLOBIN AND HEMATOCRIT, BLOOD
HEMATOCRIT: 28.6 % — AB (ref 36.0–46.0)
Hemoglobin: 9 g/dL — ABNORMAL LOW (ref 12.0–15.0)

## 2016-02-07 MED ORDER — POLYETHYLENE GLYCOL 3350 17 G PO PACK: 17.0000 g | PACK | Freq: Every day | ORAL | Status: DC

## 2016-02-07 MED ORDER — APIXABAN 5 MG PO TABS: 10.0000 mg | ORAL_TABLET | Freq: Two times a day (BID) | ORAL | Status: DC

## 2016-02-07 MED ORDER — DOCUSATE SODIUM 100 MG PO CAPS: 200.0000 mg | ORAL_CAPSULE | Freq: Two times a day (BID) | ORAL | Status: DC

## 2016-02-07 MED ORDER — CEFPODOXIME PROXETIL 200 MG PO TABS: 200.0000 mg | ORAL_TABLET | Freq: Two times a day (BID) | ORAL | Status: DC

## 2016-02-07 MED ORDER — APIXABAN 5 MG PO TABS: 5.0000 mg | ORAL_TABLET | Freq: Two times a day (BID) | ORAL | Status: DC

## 2016-02-07 NOTE — Hospital Discharge Follow-Up (Signed)
Transitional Care Clinic Care Coordination Note:  Admit date:  01/29/2016 Discharge date: ? 02/07/2016 Discharge Disposition: home Patient contact: Emergency contact(s):none  This Case Manager reviewed patient's EMR and determined patient would benefit from post-discharge medical management and chronic care management services through the [facility]. Patient has a history of DM, hypothyroidism,  Protein calorie malnutrition, PE, glaucoma, neuropathy, Crohn's disease, HLD, GERD.  She was admitted with left lower extremity erythema, swelling and pain. She has been treated for a DVT. She has 3 hospital admissions and 1 ED visit in the past year. . This Case Manager met with patient to discuss the services and medical management that can be provided at the [facility]. Patient verbalized understanding and agreed to receive post-discharge care at the [facility]. At first she was reluctant to be seen at the clinic and stated that she would prefer to have a doctor come to her house and then stated that she had a doctor that could come to her house. An update was provided to Jonnie Finner, RN CM and Fuller Plan, RN CM informing them that the patient did not want to receive care at the [facility] and would prefer a doctor who makes home visits. Fuller Plan, RN CM then met with the patient to discuss the discharge plan and the patient  agreed to follow up at the [facility].    Patient scheduled for Transitional Care appointment on 02/14/16 @ 1045.  Clinic information and appointment time provided to patient. Appointment information also placed on AVS.  Assessment:       Home Environment: lives with her 43 yo son in a first floor apartment, no stairs to navigate. Recently moved into this apartment,.        Support System: She said that the only support that she has is her son. No family or friends.        Level of functioning: independent with assist from her aide or son if needed. She  reported that she has an aide 5 hours/day Monday-Friday. The hours are split through [facility] and [facility] but she could not remember how many hours from each agency.  She said that her son assists her if needed on the weekend. The [facility] services are from  [place] as per the patient.        Home DME: wheelchair, walker. She also reported having a glucometer.        Home care services: (services arranged prior to discharge or new services after discharge) [facility] to provide RN, PT       Transportation:She stated that she has difficulty getting to her appointments. This CM explained that she has an option to contact medicaid for a transportation assessment and the [facility] can also assist with providing transportation to the clinic if needed.         Food/Nutrition: (ability to afford, access, use of any community resources)  She stated that she does have difficulty affording food at times.         Medications: (ability to afford, access, compliance, Pharmacy used) She uses [facility] delivery and said that she has not had any problems She stated that she is not able to afford the $46 for a case of BOOST supplement.         PCP (Name, office location, phone number):  She that she had a PCP at [facility] but has not been able to get to see her because she was not living on  the first floor and did not have help/transportation.            Arranged services:        Services communicated to Gildardo Griffes, RN CM

## 2016-02-07 NOTE — Hospital Discharge Follow-Up (Signed)
Met with the patient again to confirm her appointment at the [facility] the [facility] and to confirm her phone # [phone].  She stated that she does not have any other contact #/ person.   She noted that she receives $27/month in food stamps.  Provided her with the resource booklets for food pantries and free meals in [place].   Again discussed transportation and she noted that she has not contacted medicaid for transportation because she has too much back pain to ride in the vans that they use.  Reminded her that the [facility] can provide her with transportation to her [facility] clinic appointments.  She was very appreciative of the information.

## 2016-02-07 NOTE — Progress Notes (Signed)
02/07/2016 3:23 PM  Went to speak with patient regarding discharge plan.  I started with telling her we have discharge orders for her to go home today.  She replied that she has discharge orders to leave tomorrow.  I then asked her if there was a reason she could not go home today, to which she replied that I needed to ask the social worker and case manager, they could tell me.  I replied that I was asking her directly, and if there was a reason she couldn't answer me directly.  She became increasingly agitated, not wanting to communicate.  She finally stated that she didn't have a way to get in her home.  I then asked her if her son was home to assist, to which she replied "did I stutter?"  According to patient, son and family is out of town and she does not have a way in the house, per case management.  Dr. Thedore Mins notified.  Medically clear per him, discharge when appropriate.  Theadora Rama

## 2016-02-07 NOTE — Progress Notes (Signed)
Went in to speak with patient regarding discharge plan. Patient stated that she and the MD had a conversation and she was told she would be discharged tomorrow 5/10. Patient stated that she currently cannot get into her home, and that her son is unavailable to bring her key. Patient mentioned that she does not have food at home. Noted that she has the brochures for free food and meals given to her by the CM. Patient became tearful stating that she does not have help at home. Stated that her son assists intermittently but not consistently. Stated that she will be able to go home tomorrow around 10 or 11 she should have all of her arrangements made. Stated that she will need ambulance to go home.  Dr. Thedore Mins made aware by Tora Perches, Charge Nurse. Patient thankful for visit. Hayward Rylander, Charlyne Quale

## 2016-02-07 NOTE — Progress Notes (Signed)
Patient appears to be comfortable yet is demanding her pain medicine. I have calmly explained to the patient when her next dose of pain medicine is due and she will continue to demand for the medicine ask me to call the MD to request more medicine.

## 2016-02-07 NOTE — Clinical Documentation Improvement (Signed)
Internal Medicine  Can the diagnosis of anemia be further specified?   Iron deficiency Anemia  Nutritional anemia, including the nutrition or mineral deficits  Chronic Anemia, including the suspected or known cause  Anemia of chronic disease, including the associated chronic disease state  ABLA  Other  Clinically Undetermined  Document any associated diagnoses/conditions. Supporting Information: Component     Latest Ref Rng 01/29/2016 01/30/2016 01/31/2016 02/01/2016 02/02/2016             5:18 AM  Hemoglobin     12.0 - 15.0 g/dL 16.1 (L) 8.8 (L) 8.5 (L) 7.8 (L) 6.8 (LL)   Component     Latest Ref Rng 02/02/2016 02/02/2016        12:05 PM  4:53 PM  Hemoglobin     12.0 - 15.0 g/dL 8.5 (L) 9.1 (L)   Transfused 1 uPRBC on 02/02/16 continue to monitor, f/u stat CBC   Please exercise your independent, professional judgment when responding. A specific answer is not anticipated or expected. Please update your documentation within the medical record to reflect your response to this query. Thank you  Thank Barrie Dunker Health Information Management [place] [phone]

## 2016-02-07 NOTE — Progress Notes (Signed)
PT Cancellation  And Discharge Note  Patient Details Name: [name] MRN: [id number] DOB: 08-Jan-1961   Cancelled Treatment:    Reason Eval/Treat Not Completed: Other (comment)   Spoke with [name] Macho, RN, who relayed to me that [name] is declining PT for today, no reason specified;   This is [name]' third refusal in a row and per policy, will discharge Acute PT;   Agree with [facility] services to follow up, and will likely need non-emergent ambulance transport home.  Van Clines, [place]  [facility] Pager [phone] Office [phone]    Van Clines [facility] 02/07/2016, 2:41 PM

## 2016-02-07 NOTE — Discharge Summary (Signed)
[name], is a 55 y.o. female  DOB 1961/08/31  MRN [id number].  Admission date:  01/29/2016  Admitting Physician  Jeralyn Bennett, MD  Discharge Date:  02/07/2016   Primary MD  No primary care provider on file.  Recommendations for primary care physician for things to follow:   CBC, CMP next visit. Review CT scan findings. Monitor narcotic use.  Outpatient GI follow-up for possible fatty liver on CT scan and elevated LFTs.   Admission Diagnosis  DVT (deep venous thrombosis), left [I82.402]   Discharge Diagnosis  DVT (deep venous thrombosis), left [I82.402]     Principal Problem:   DVT (deep venous thrombosis), unspecified laterality Active Problems:   DM2 (diabetes mellitus, type 2) (HCC)   HTN (hypertension)   Normocytic anemia   History of pulmonary embolus (PE)   Severe protein-calorie malnutrition (HCC)   Poor venous access   Abdominal pain   Dyspnea   Pain in the chest      Past Medical History  Diagnosis Date  . Glaucoma   . Diabetes mellitus without complication (HCC)   . Hypertension   . Neuropathy (HCC)   . Crohn disease (HCC)   . Asthma   . Pulmonary embolus (HCC) 05/24/2014  . Hypothyroidism   . GERD (gastroesophageal reflux disease)   . HLD (hyperlipidemia)   . C. difficile colitis   . Pancreatitis   . Asthma   . Tobacco abuse     Past Surgical History  Procedure Laterality Date  . Cesarean section    . Esophagogastroduodenoscopy N/A 05/21/2014    Procedure: ESOPHAGOGASTRODUODENOSCOPY (EGD);  Surgeon: Petra Kuba, MD;  Location: [facility] ENDOSCOPY;  Service: Endoscopy;  Laterality: N/A;  . Esophagogastroduodenoscopy N/A 03/02/2015    Procedure: ESOPHAGOGASTRODUODENOSCOPY (EGD);  Surgeon: Bernette Redbird, MD;  Location: [facility] ENDOSCOPY;  Service: Endoscopy;  Laterality: N/A;  .  Colonoscopy N/A 03/02/2015    Procedure: COLONOSCOPY;  Surgeon: Bernette Redbird, MD;  Location: [facility] ENDOSCOPY;  Service: Endoscopy;  Laterality: N/A;       HPI  from the history and physical done on the day of admission:    55 y.o. female with multiple comorbidities including type 2 diabetes mellitus, hypothyroidism, severe protein calorie malnutrition, history of pulmonary embolism, presented to the emergency department 4/30 with complaints of Left lower extremity pain associated with edema and erythema x 2 days. Upon presentation to the ER she was found to have 10/10 pain to that leg with decreased mobility. She had bedside ultrasound in ED which demonstrated occlusive DVT throughout the L lower extremity and she was started on IV heparin. 5/2 she went to IR for venous catheter placement for thrombolysis, post-procedure she was transferred to ICU for continued lytics. Once procedure was complete, she was transferred to floor and TRH took over 5/5.  Note patient has been very uncooperative with the staff, PT and nursing, she is refusing to get out of the bed or sit up in the recliner, refusing to participate with PT, every day she becomes tearful when  it's time to go home or she is told she is being discharged. On the night of 02/05/2016 when she was told she is going home next morning she started complaining of abdominal pain, workup showed only large stool burden in her colon. Morning of 02/06/2016 she is eating breakfast comfortably appears to be in no distress with benign abdominal exam. She has been told again and again that she will be discharged finally on 02/07/2016.   On 02/05/2016 - PT eval, patient refused to go today, refused to work with PT, refused to sit up in the recliner on 02/04/2016. On 02/05/2016 she refused to go home and requested me to keep her here one more day for PT eval. She promised she would cooperate today and will go home tomorrow. She subsequently refused to go home and  wanted to go to a SNF, SNF was arranged she then refused going to SNF and wanted to go home with home PT which has been arranged. Please review social work note as well along with nursing notes.   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 She has prior history of PE, was off of anticoagulation for the past year as she completed course. Tolerated Eliquis well in the past, have transitioned her from heparin drip to Eliquis on 02/04/2016, will monitor H&H which has remained stable.  Anemia - drop in Hb to 6.8 on 5/3 without clinical bleeding, s/p 1U pRBC, Hb 9.1 >> 8.9, hemoglobin stable with PCP to repeat CBC next admission.  Shortness of breath. - On admission she reported having some shortness of breath and was tachycardic in the emergency department , Initially ordered a CT PE study however was informed of her contrast allergy. Ordered a VQ scan however she was unable to tolerate procedure. Transthoracic echocardiogram as below, normal EF, RV looks good. Symptoms have Resolved.  History of GI bleed - She had been hospitalized in June 2016 for GI bleed while on Eliquis tx. Aparently this was in the setting of Crohn's colitis.She has had no issues with GI bleed in the last 2 years, continue to monitor closely. We'll have her follow with her GI physician post discharge. Her Crohn's is currently in remission for the last 2 years.   Elevated transaminases.  Presented with elevated AST, ALT and alkaline phosphatase. She has a history elevated liver enzymes.  She had a recent CT of abdomen performed on 01/09/2016 that revealed hepatic steatosis. Follow with GI outpatient. Liver enzymes trending down.   Hypertension - stable continue home regimen unchanged.   Abdominal pain night of 02/05/2016. CT abdomen and pelvis shows large stone burden  suggestive of narcotic bowel, patient takes narcotics regularly at home, initiate bowel regimen, having breakfast this morning appears comfortable, exam benign. No further workup.   1 isolated borderline troponin of 0.06. EKG nonacute. This was obtained on 02/05/2016 night for abdominal pain. Recent echo unremarkable. On Eliquis continue, 1 troponin which was second set was borderline positive one before and after negative suggesting that this was a false positive troponin.  UTI. Treated with Rocephin 2 doses, place on 2 more days of Vantin and discharge.   Follow UP  Follow-up Information    Follow up with Berdine Dance, MD. Go in 4 weeks.   Specialty:  Interventional Radiology   Why:  Post procedure eval. Office will  contact you with appointment date/time   Contact information:   [address] [place] [address] [phone]       Follow up with [facility].   Specialty:  Home Health Services   Why:  local office number, [phone]. office will call to arrange for Home Health   Contact information:   [address] [place] [address] [phone]       Follow up with [facility] On 02/14/2016.   Specialty:  Internal Medicine   Why:  at 10:45am for an appointment wiht Dr Carleene Overlie information:   [address] [id number] mc [address] [place] [address] [phone]       Consults obtained - None  Discharge Condition: Stable  Diet and Activity recommendation: See Discharge Instructions below  Discharge Instructions       Discharge Instructions    Diet - low sodium heart healthy    Complete by:  As directed      Discharge instructions    Complete by:  As directed   Follow with Primary MD in 2-3 days   Get CBC, CMP, 2 view Chest X ray checked  by Primary MD next visit.    Activity: As tolerated with Full fall precautions use walker/cane & assistance as  needed   Disposition Home     Diet:   Heart Healthy  .  For Heart failure patients - Check your Weight same time everyday, if you gain over 2 pounds, or you develop in leg swelling, experience more shortness of breath or chest pain, call your Primary MD immediately. Follow Cardiac Low Salt Diet and 1.5 lit/day fluid restriction.   On your next visit with your primary care physician please Get Medicines reviewed and adjusted.   Please request your Prim.MD to go over all Hospital Tests and Procedure/Radiological results at the follow up, please get all Hospital records sent to your Prim MD by signing hospital release before you go home.   If you experience worsening of your admission symptoms, develop shortness of breath, life threatening emergency, suicidal or homicidal thoughts you must seek medical attention immediately by calling 911 or calling your MD immediately  if symptoms less severe.  You Must read complete instructions/literature along with all the possible adverse reactions/side effects for all the Medicines you take and that have been prescribed to you. Take any new Medicines after you have completely understood and accpet all the possible adverse reactions/side effects.   Do not drive, operating heavy machinery, perform activities at heights, swimming or participation in water activities or provide baby sitting services if your were admitted for syncope or siezures until you have seen by Primary MD or a Neurologist and advised to do so again.  Do not drive when taking Pain medications.    Do not take more than prescribed Pain, Sleep and Anxiety Medications  Special Instructions: If you have smoked or chewed Tobacco  in the last 2 yrs please stop smoking, stop any regular Alcohol  and or any Recreational drug use.  Wear Seat belts while driving.   Please note  You were cared for by a hospitalist during your hospital stay. If you have any questions about your discharge  medications or the care you received while you were in the hospital after you are discharged, you can call the unit and asked to speak with the hospitalist on call if the hospitalist that took  care of you is not available. Once you are discharged, your primary care physician will handle any further medical issues. Please note that NO REFILLS for any discharge medications will be authorized once you are discharged, as it is imperative that you return to your primary care physician (or establish a relationship with a primary care physician if you do not have one) for your aftercare needs so that they can reassess your need for medications and monitor your lab values.     Increase activity slowly    Complete by:  As directed              Discharge Medications       Medication List    TAKE these medications        apixaban 5 MG Tabs tablet  Commonly known as:  ELIQUIS  Take 2 tablets (10 mg total) by mouth 2 (two) times daily.     apixaban 5 MG Tabs tablet  Commonly known as:  ELIQUIS  Take 1 tablet (5 mg total) by mouth 2 (two) times daily.  Start taking on:  02/11/2016     calcium carbonate 1250 (500 Ca) MG tablet  Commonly known as:  OS-CAL - dosed in mg of elemental calcium  Take 1 tablet (500 mg of elemental calcium total) by mouth daily with breakfast.     cefpodoxime 200 MG tablet  Commonly known as:  VANTIN  Take 1 tablet (200 mg total) by mouth 2 (two) times daily.     cephALEXin 500 MG capsule  Commonly known as:  KEFLEX  Take 1 capsule (500 mg total) by mouth 2 (two) times daily.     docusate sodium 100 MG capsule  Commonly known as:  COLACE  Take 2 capsules (200 mg total) by mouth 2 (two) times daily.     feeding supplement Liqd  Take 1 Container by mouth 3 (three) times daily between meals.     folic acid 1 MG tablet  Commonly known as:  FOLVITE  Take 1 tablet (1 mg total) by mouth daily.     gabapentin 100 MG capsule  Commonly known as:  NEURONTIN  Take 1  capsule (100 mg total) by mouth 2 (two) times daily.     HYDROcodone-acetaminophen 5-325 MG tablet  Commonly known as:  NORCO/VICODIN  Take 1 tablet by mouth every 6 (six) hours as needed.     HYDROmorphone 4 MG tablet  Commonly known as:  DILAUDID  Take 8 mg by mouth every 4 (four) hours as needed for severe pain.     lactulose 10 GM/15ML solution  Commonly known as:  CHRONULAC  Take 15 mLs (10 g total) by mouth daily as needed for moderate constipation.     levothyroxine 25 MCG tablet  Commonly known as:  SYNTHROID, LEVOTHROID  Take 1 tablet (25 mcg total) by mouth daily before breakfast.     meclizine 25 MG tablet  Commonly known as:  ANTIVERT  Take 25 mg by mouth 3 (three) times daily as needed for dizziness.     metoprolol tartrate 25 MG tablet  Commonly known as:  LOPRESSOR  Take 0.5 tablets (12.5 mg total) by mouth 2 (two) times daily.     ondansetron 4 MG disintegrating tablet  Commonly known as:  ZOFRAN ODT  Take 1 tablet (4 mg total) by mouth every 8 (eight) hours as needed for nausea or vomiting.     ondansetron 4 MG tablet  Commonly known as:  ZOFRAN  Take 4 mg by mouth every 8 (eight) hours as needed for nausea or vomiting.     oxyCODONE-acetaminophen 5-325 MG tablet  Commonly known as:  PERCOCET/ROXICET  Take 1 tablet by mouth every 6 (six) hours as needed for moderate pain.     pantoprazole 20 MG tablet  Commonly known as:  PROTONIX  Take 40 mg by mouth daily.     polyethylene glycol packet  Commonly known as:  MIRALAX / GLYCOLAX  Take 17 g by mouth daily.     rosuvastatin 20 MG tablet  Commonly known as:  CRESTOR  Take 1 tablet (20 mg total) by mouth daily.     saccharomyces boulardii 250 MG capsule  Commonly known as:  FLORASTOR  Take 1 capsule (250 mg total) by mouth 2 (two) times daily.     thiamine 100 MG tablet  Commonly known as:  VITAMIN B-1  Take 1 tablet (100 mg total) by mouth daily.     traZODone 25 mg Tabs tablet  Commonly known  as:  DESYREL  Take 0.5 tablets (25 mg total) by mouth at bedtime as needed for sleep.     venlafaxine 25 MG tablet  Commonly known as:  EFFEXOR  Take 1 tablet (25 mg total) by mouth 2 (two) times daily.        Major procedures and Radiology Reports - PLEASE review detailed and final reports for all details, in brief -    2D echo - Left ventricle: The cavity size was normal. Systolic function was normal. The estimated ejection fraction was in the range of 55% to 60%. Wall motion was normal; there were no regional wall motion abnormalities. Doppler parameters are consistent with abnormal left ventricular relaxation (grade 1 diastolic dysfunction). Acoustic contrast opacification revealed no evidence of thrombus.    Ct Abdomen Pelvis Wo Contrast  02/06/2016  CLINICAL DATA:  Abdominal pain, diffuse. EXAM: CT ABDOMEN AND PELVIS WITHOUT CONTRAST TECHNIQUE: Multidetector CT imaging of the abdomen and pelvis was performed following the standard protocol without IV contrast. COMPARISON:  Radiographs 02/05/2016.  CT 01/09/2016. FINDINGS: In the lower chest, there is posterior left lung base opacity which may be atelectatic but infectious infiltrate cannot be excluded. There is a 2 cm mass and the liver dome, present over the past several examinations dating back to at least 07/30/2011. There are otherwise unremarkable unenhanced appearances of the liver and bile ducts. There are unremarkable unenhanced appearances of the spleen, pancreas, adrenals and renal parenchyma. Multiple 2-3 mm collecting system calculi are present in both kidneys but there is no evidence of ureteral obstruction. There is no ureteral calculus. Urinary bladder is nearly empty, with a Foley catheter, and appears grossly unremarkable. The abdominal aorta is normal in caliber with mild atherosclerotic calcification. There are vascular stents within the left common iliac vein. The colon is moderately distended with air and stool. No  evidence of bowel obstruction. Unremarkable small bowel and stomach. No acute inflammatory changes are evident in the abdomen or pelvis. There is no adenopathy. There is no ascites. There is no significant musculoskeletal lesion. IMPRESSION: 1. Moderate colonic distention with stool and air. Negative for bowel obstruction or perforation 2. No acute inflammatory changes are evident in the abdomen or pelvis. 3. Left lung base atelectasis or infiltrate. 4. Bilateral nephrolithiasis. No ureteral calculus. No evidence of ureteral obstruction Electronically Signed   By: Ellery Plunk M.D.   On: 02/06/2016 02:27   Ct Abdomen Pelvis Wo Contrast  01/09/2016  CLINICAL DATA:  Abdominal pain with nausea and vomiting for 2 days. History of Crohn's disease and pancreatitis EXAM: CT ABDOMEN AND PELVIS WITHOUT CONTRAST TECHNIQUE: Multidetector CT imaging of the abdomen and pelvis was performed following the standard protocol without IV contrast. COMPARISON:  Feb 28, 2015 FINDINGS: Lower chest: There is patchy atelectasis in the lateral left base region. There is a lesser degree of atelectatic change in the posterior and lateral right base regions. Lung bases otherwise are clear. There are multiple foci of coronary artery calcification. Hepatobiliary: There is hepatic steatosis. There is again noted a mass in the dome of the liver anteriorly measuring 2.2 x 1.4 cm. There is minimal calcification along the periphery of this lesion. No other focal lesion is identified on this noncontrast enhanced study. The gallbladder wall is not appreciably thickened. The common bile duct measures 14 mm, stable from prior studies. No biliary duct mass or calculus is evident. Pancreas: There is no pancreatic mass or inflammatory focus. Mild dilatation of the pancreatic duct is a stable finding. Spleen: No splenic lesions are evident. Adrenals/Urinary Tract: Adrenals appear unremarkable and stable bilaterally. There is no hydronephrosis on  either side. There is a 4 mm cyst in the periphery of the right kidney. There is a 2 mm calculus in the upper pole of the right kidney. There is a 2 mm calculus in the lower pole of the right kidney. There is a 2 mm calculus in the mid right kidney. On the left, there is a 1 mm calculus in the upper to midportion. No ureteral calculi are identified on either side. The urinary bladder is midline with wall thickness within normal limits. Stomach/Bowel: There is evidence of previous surgery in left pelvis with anastomosis patent. There is no bowel wall or mesenteric thickening. There is no bowel obstruction. No free air or portal venous air. The terminal ileal region appears unremarkable on this study. Vascular/Lymphatic: There is atherosclerotic calcification throughout the aorta and common iliac arteries. There is also calcification in both external iliac and hypogastric arteries. No abdominal aortic aneurysm is seen. The major mesenteric vessels appear patent on this noncontrast enhanced study. There is no adenopathy in the abdomen or pelvis. Reproductive: Uterus is anteverted. There is no pelvic mass or pelvic fluid collection. Other: Appendix appears normal. No ascites or abscess is appreciable in the abdomen or pelvis. There is a minimal ventral hernia containing only fat. There is chronic scarring in the posterior perineal region, stable. Musculoskeletal: There is no blastic or lytic bone lesions. There is degenerative type change in the lumbar spine. There is no intramuscular or abdominal wall lesion. IMPRESSION: No bowel obstruction. No appreciable bowel wall thickening. Postoperative changes noted in the left lower pelvis. Appendix appears normal. There are small nonobstructing calculi in each kidney. No hydronephrosis or ureteral calculus on either side. Hepatic steatosis. Well-circumscribed in the dome of the liver, felt to be of benign etiology. This lesion now does show mild peripheral calcification.  Question old hematoma. Areas of patchy atelectasis in lung bases, primarily in the lateral left base. This area in the lateral left base potentially could represent the earliest changes of pneumonia. Coronary artery calcification noted. Stable chronic scarring in the posterior perineal region. Electronically Signed   By: Bretta Bang III M.D.   On: 01/09/2016 16:10   Ir Veno/ext/uni Left  02/01/2016  INDICATION: Symptomatic left lower extremity deep vein thrombosis. Thrombus is noted to extend into the left iliac veins and there is evidence for May-Thurner syndrome based on  CT examination. Plan for catheter directed thrombolytic therapy. EXAM: LEFT LOWER EXTREMITY VENOGRAM; INITIATION OF CATHETER DIRECTED THROMBOLYSIS IN THE LEFT LOWER EXTREMITY AND PELVIS; ULTRASOUND GUIDANCE FOR VASCULAR ACCESS COMPARISON:  CT venogram 01/31/2016 MEDICATIONS: 25 mg Benadryl IV. The patient was pre-medicated with Benadryl and prednisone for Iohexol allergy. Patient tolerated the recent CT venogram without symptoms. ANESTHESIA/SEDATION: Versed 0.5 mg IV; Fentanyl 50 mcg IV Moderate Sedation Time:  11 minutes The patient was continuously monitored during the procedure by the interventional radiology nurse under my direct supervision. FLUOROSCOPY TIME:  Fluoroscopy Time: 4 minutes 36 seconds CONTRAST:  20 mL Omnipaque 300 COMPLICATIONS: None immediate. TECHNIQUE: Informed written consent was obtained from the patient after a thorough discussion of the procedural risks, benefits and alternatives. All questions were addressed. Maximal Sterile Barrier Technique was utilized including caps, mask, sterile gowns, sterile gloves, sterile drape, hand hygiene and skin antiseptic. A timeout was performed prior to the initiation of the procedure. Patient was placed prone. The left popliteal region was prepped and draped in a sterile fashion. Skin was anesthetized with 1% lidocaine. 21 gauge needle directed into the occluded popliteal vein  with ultrasound guidance. A micropuncture dilator set was placed. Small amount of contrast was injected to confirm placement in the vein. A Bentson wire was placed and a 6 [place] vascular sheath was placed. A 100 cm, 5 French catheter was easily advanced into the left common iliac vein. Small amount of contrast was injected to confirm thrombus throughout the left femoral vein and left iliac veins. Collateral vessels were also identified. Glidewire was used to cannulate the IVC and catheter was advanced into the lower IVC. IVC venogram was performed. The 5 French catheter was removed and a 90 cm (50 cm infusion length) catheter was advanced over the Bentson wire. Tip of the catheter was placed in the distal IVC. Distal aspect of the catheter is in the distal femoral vein. Catheter directed thrombolysis was started at 0.4 mg/hr of tenecteplase. The sheath was sutured to the skin. Bandage placed over the sheath and infusion catheter. FINDINGS: Thrombosis of the left popliteal vein by ultrasound. Left lower extremity venogram confirmed thrombus throughout the left femoral vein and left iliac veins. The IVC is patent. Infusion catheter placed in the left femoral and left iliac veins. IMPRESSION: Initiation of catheter directed thrombolysis in the left lower extremity and pelvis. Plan for catheter directed thrombolysis overnight. Patient will return to Interventional Radiology on 02/01/2016 for follow-up venogram with possible balloon angioplasty and stent placement in the left common iliac vein. Electronically Signed   By: Richarda Overlie M.D.   On: 01/31/2016 17:33   Ir Venocavagram Ivc  02/01/2016  INDICATION: Symptomatic left lower extremity deep vein thrombosis. 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 Plan for catheter directed thrombolytic therapy. EXAM: LEFT LOWER EXTREMITY VENOGRAM; INITIATION OF CATHETER DIRECTED THROMBOLYSIS IN  THE LEFT LOWER EXTREMITY AND PELVIS; ULTRASOUND GUIDANCE FOR VASCULAR ACCESS COMPARISON:  CT venogram 01/31/2016 MEDICATIONS: 25 mg Benadryl IV. The patient was pre-medicated with Benadryl and prednisone for Iohexol allergy. Patient tolerated the recent CT venogram without symptoms. ANESTHESIA/SEDATION: Versed 0.5 mg IV; Fentanyl 50 mcg IV Moderate Sedation Time:  11 minutes The patient was continuously monitored during the procedure by the interventional radiology nurse under my direct supervision. FLUOROSCOPY TIME:  Fluoroscopy Time: 4 minutes 36 seconds CONTRAST:  20 mL Omnipaque 300 COMPLICATIONS: None immediate. TECHNIQUE: Informed written consent was  obtained from the patient after a thorough discussion of the procedural risks, benefits and alternatives. All questions were addressed. Maximal Sterile Barrier Technique was utilized including caps, mask, sterile gowns, sterile gloves, sterile drape, hand hygiene and skin antiseptic. A timeout was performed prior to the initiation of the procedure. Patient was placed prone. The left popliteal region was prepped and draped in a sterile fashion. Skin was anesthetized with 1% lidocaine. 21 gauge needle directed into the occluded popliteal vein with ultrasound guidance. A micropuncture dilator set was placed. Small amount of contrast was injected to confirm placement in the vein. A Bentson wire was placed and a 6 [place] vascular sheath was placed. A 100 cm, 5 French catheter was easily advanced into the left common iliac vein. Small amount of contrast was injected to confirm thrombus throughout the left femoral vein and left iliac veins. Collateral vessels were also identified. Glidewire was used to cannulate the IVC and catheter was advanced into the lower IVC. IVC venogram was performed. The 5 French catheter was removed and a 90 cm (50 cm infusion length) catheter was advanced over the Bentson wire. Tip of the catheter was placed in the distal IVC. Distal aspect of  the catheter is in the distal femoral vein. Catheter directed thrombolysis was started at 0.4 mg/hr of tenecteplase. The sheath was sutured to the skin. Bandage placed over the sheath and infusion catheter. FINDINGS: Thrombosis of the left popliteal vein by ultrasound. Left lower extremity venogram confirmed thrombus throughout the left femoral vein and left iliac veins. The IVC is patent. Infusion catheter placed in the left femoral and left iliac veins. IMPRESSION: Initiation of catheter directed thrombolysis in the left lower extremity and pelvis. Plan for catheter directed thrombolysis overnight. Patient will return to Interventional Radiology on 02/01/2016 for follow-up venogram with possible balloon angioplasty and stent placement in the left common iliac vein. Electronically Signed   By: Richarda Overlie M.D.   On: 01/31/2016 17:33   Ir US Guide Vasc Access Left  01/31/2016  INDICATION: Symptomatic left lower extremity deep vein thrombosis. 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 Plan for catheter directed thrombolytic therapy. EXAM: LEFT LOWER EXTREMITY VENOGRAM; INITIATION OF CATHETER DIRECTED THROMBOLYSIS IN THE LEFT LOWER EXTREMITY AND PELVIS; ULTRASOUND GUIDANCE FOR VASCULAR ACCESS COMPARISON:  CT venogram 01/31/2016 MEDICATIONS: 25 mg Benadryl IV. The patient was pre-medicated with Benadryl and prednisone for Iohexol allergy. Patient tolerated the recent CT venogram without symptoms. ANESTHESIA/SEDATION: Versed 0.5 mg IV; Fentanyl 50 mcg IV Moderate Sedation Time:  11 minutes The patient was continuously monitored during the procedure by the interventional radiology nurse under my direct supervision. FLUOROSCOPY TIME:  Fluoroscopy Time: 4 minutes 36 seconds CONTRAST:  20 mL Omnipaque 300 COMPLICATIONS: None immediate. TECHNIQUE: Informed written consent was obtained from the patient after a thorough discussion of the  procedural risks, benefits and alternatives. All questions were addressed. Maximal Sterile Barrier Technique was utilized including caps, mask, sterile gowns, sterile gloves, sterile drape, hand hygiene and skin antiseptic. A timeout was performed prior to the initiation of the procedure. Patient was placed prone. The left popliteal region was prepped and draped in a sterile fashion. Skin was anesthetized with 1% lidocaine. 21 gauge needle directed into the occluded popliteal vein with ultrasound guidance. A micropuncture dilator set was placed. Small amount of contrast was injected to confirm placement in the vein. A Bentson wire was placed and a 6 [place]  vascular sheath was placed. A 100 cm, 5 French catheter was easily advanced into the left common iliac vein. Small amount of contrast was injected to confirm thrombus throughout the left femoral vein and left iliac veins. Collateral vessels were also identified. Glidewire was used to cannulate the IVC and catheter was advanced into the lower IVC. IVC venogram was performed. The 5 French catheter was removed and a 90 cm (50 cm infusion length) catheter was advanced over the Bentson wire. Tip of the catheter was placed in the distal IVC. Distal aspect of the catheter is in the distal femoral vein. Catheter directed thrombolysis was started at 0.4 mg/hr of tenecteplase. The sheath was sutured to the skin. Bandage placed over the sheath and infusion catheter. FINDINGS: Thrombosis of the left popliteal vein by ultrasound. Left lower extremity venogram confirmed thrombus throughout the left femoral vein and left iliac veins. The IVC is patent. Infusion catheter placed in the left femoral and left iliac veins. IMPRESSION: Initiation of catheter directed thrombolysis in the left lower extremity and pelvis. Plan for catheter directed thrombolysis overnight. Patient will return to Interventional Radiology on 02/01/2016 for follow-up venogram with possible balloon  angioplasty and stent placement in the left common iliac vein. Electronically Signed   By: Richarda Overlie M.D.   On: 01/31/2016 17:33   Ir US Guide Vasc Access Right  01/31/2016  INDICATION: 55 year old with left lower extremity DVT. Poor venous access and patient needs CT examination and IV access. EXAM: RIGHT ARM PICC LINE PLACEMENT WITH ULTRASOUND AND FLUOROSCOPIC GUIDANCE MEDICATIONS: None ANESTHESIA/SEDATION: None FLUOROSCOPY TIME:  Fluoroscopy Time: 18 seconds, 0.2 mGy COMPLICATIONS: None immediate. PROCEDURE: The patient was advised of the possible risks and complications and agreed to undergo the procedure. The patient was then brought to the angiographic suite for the procedure. The right arm was prepped with chlorhexidine, draped in the usual sterile fashion using maximum barrier technique (cap and mask, sterile gown, sterile gloves, large sterile sheet, hand hygiene and cutaneous antiseptic). Local anesthesia was attained by infiltration with 1% lidocaine. Ultrasound demonstrated patency of the right brachial vein, and this was documented with an image. Under real-time ultrasound guidance, this vein was accessed with a 21 gauge micropuncture needle and image documentation was performed. The needle was exchanged over a guidewire for a peel-away sheath through which a 32 cm 5 [place] dual lumen power injectable PICC was advanced, and positioned with its tip at the lower SVC/right atrial junction. Fluoroscopy during the procedure and fluoro spot radiograph confirms appropriate catheter position. The catheter was flushed, secured to the skin, and covered with a sterile dressing. IMPRESSION: Successful placement of a right arm PICC with sonographic and fluoroscopic guidance. The catheter is ready for use. Electronically Signed   By: Richarda Overlie M.D.   On: 01/31/2016 12:30   Dg Chest Port 1 View  02/05/2016  CLINICAL DATA:  Shortness of Breath. EXAM: PORTABLE CHEST 1 VIEW COMPARISON:  Chest x-ray 01/29/2016  FINDINGS: Right PICC line tip is in the distal SVC just above the cavoatrial junction. The heart is normal in size. The mediastinal and hilar contours are normal. The lungs are clear. Prominent skin fold noted over the right chest but no definite pneumothorax. Possible very small left pleural effusion. The bony thorax is intact. IMPRESSION: No acute cardiopulmonary findings. Possible very small left pleural effusion. Right PICC line in good position. Electronically Signed   By: Rudie Meyer M.D.   On: 02/05/2016 20:40   Dg Chest [facility] 1 [address]  01/29/2016  CLINICAL DATA:  Shortness of breath EXAM: PORTABLE CHEST 1 VIEW COMPARISON:  CTA chest dated 03/31/2015 FINDINGS: Patient is rotated and her chin obscures the right lung apex. Lungs are clear.  No pleural effusion or pneumothorax. Heart is normal in size. IMPRESSION: No evidence of acute cardiopulmonary disease. Electronically Signed   By: Charline Bills M.D.   On: 01/29/2016 19:00   Dg Abd Portable 1v  02/05/2016  CLINICAL DATA:  Abdominal pain. EXAM: PORTABLE ABDOMEN - 1 VIEW COMPARISON:  None. FINDINGS: The bowel gas pattern is unremarkable. No dilated bowel loops are present. A left common iliac venous stent is noted. No acute bony abnormalities noted. IMPRESSION: No acute abnormalities. Electronically Signed   By: Harmon Pier M.D.   On: 02/05/2016 20:40   Ir Fluoro Guide Cv Midline Picc Right  01/31/2016  INDICATION: 55 year old with left lower extremity DVT. Poor venous access and patient needs CT examination and IV access. EXAM: RIGHT ARM PICC LINE PLACEMENT WITH ULTRASOUND AND FLUOROSCOPIC GUIDANCE MEDICATIONS: None ANESTHESIA/SEDATION: None FLUOROSCOPY TIME:  Fluoroscopy Time: 18 seconds, 0.2 mGy COMPLICATIONS: None immediate. PROCEDURE: The patient was advised of the possible risks and complications and agreed to undergo the procedure. The patient was then brought to the angiographic suite for the procedure. The right arm was prepped with  chlorhexidine, draped in the usual sterile fashion using maximum barrier technique (cap and mask, sterile gown, sterile gloves, large sterile sheet, hand hygiene and cutaneous antiseptic). Local anesthesia was attained by infiltration with 1% lidocaine. Ultrasound demonstrated patency of the right brachial vein, and this was documented with an image. Under real-time ultrasound guidance, this vein was accessed with a 21 gauge micropuncture needle and image documentation was performed. The needle was exchanged over a guidewire for a peel-away sheath through which a 32 cm 5 [place] dual lumen power injectable PICC was advanced, and positioned with its tip at the lower SVC/right atrial junction. Fluoroscopy during the procedure and fluoro spot radiograph confirms appropriate catheter position. The catheter was flushed, secured to the skin, and covered with a sterile dressing. IMPRESSION: Successful placement of a right arm PICC with sonographic and fluoroscopic guidance. The catheter is ready for use. Electronically Signed   By: Richarda Overlie M.D.   On: 01/31/2016 12:30   Ct Venogram Abd/pel  01/31/2016  CLINICAL DATA:  55 year old with a left lower extremity DVT and complains of left leg pain and swelling. Patient is being evaluated for possible catheter directed thrombolytic therapy. Recent venous duplex examination could not evaluate the iliac venous system. Evaluate for pelvic DVT and May-Thurner syndrome. EXAM: CTA ABDOMEN AND PELVIS WITH CONTRAST (CT VENOGRAM) TECHNIQUE: Multidetector CT imaging of the abdomen and pelvis was performed using the standard protocol during bolus administration of intravenous contrast. Multiplanar reconstructed images and MIPs were obtained and reviewed to evaluate the vascular anatomy. CONTRAST:  ISOVUE-300 IOPAMIDOL (ISOVUE-300) INJECTION 61% COMPARISON:  01/09/2016 FINDINGS: Venous structures: Hepatic veins, IVC and bilateral renal veins are patent. There is a small amount of  thrombus in the distal IVC extending from the left common iliac vein. 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 There is thrombus in the left common and left external iliac vein. There is edema throughout the left thigh and evidence for filling defects in the left common femoral vein and left femoral vein. Right femoral veins and right iliac veins are patent without distension. Portal venous  system is patent. Arterial structures: Atherosclerotic disease in the aorta and iliac arteries. There is at least mild narrowing in the right common iliac artery. Circumferential calcifications in the superficial femoral arteries bilaterally. Lower chest: There is a 2.0 cm nodular pleural-based density along the lateral left lower lobe on sequence 3, image 17. Previously, there were pleural-based densities in this area but this area has clearly enlarged from 01/09/2016. The pleural-based structure has fluid density and could represent a small amount of loculated fluid. Otherwise, the lung bases are clear. Nonvascular structures: Again noted is a 1.9 cm low-density structure along the hepatic dome. This could be related to a cyst. Low density throughout the liver parenchyma is suggestive for hepatic steatosis. The distal common bile duct is enlarged measuring up to 1.9 cm and previously measured 1.4 cm. There is no significant inflammation. No significant enlargement of the gallbladder. Dilatation of the main pancreatic duct without inflammatory changes. Normal appearance of spleen without enlargement. Normal appearance of both adrenal glands. No acute abnormality to either kidney. Again noted is a small cortical calcification involving the right kidney lower pole. No acute abnormality to the stomach, small bowel or colon. There is some distension of the rectum. Normal appearance of the appendix. There appears to be a small  uterus but poorly characterized. Small amount of fluid and edema in the abdomen. Extensive edema throughout the lower abdominal and pelvic subcutaneous tissues. Subcutaneous edema in the left upper extremity. Again noted is soft tissue fullness in the perineum. No acute bone abnormality. Review of the MIP images confirms the above findings. IMPRESSION: Left lower extremity deep vein thrombosis extends into the left iliac veins with a small amount of clot extending into the distal IVC. In addition, there is compression of the left common iliac vein from the right common iliac artery. 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 Extensive subcutaneous edema throughout the left lower extremity and pelvis. Increased distension of the common bile duct of uncertain etiology. This structure has been dilated in the past but appears to be enlarging. Patient does have elevated liver enzymes. Recommend GI consultation and consider further evaluation with MRCP. Pancreatic duct is also dilated. Hepatic steatosis. 2 cm pleural-based density in the left lower lobe may be related to fluid but this has increased since an exam on 01/09/2016. Recommend a 6-8 week follow-up chest CT to ensure resolution. Electronically Signed   By: Richarda Overlie M.D.   On: 01/31/2016 16:13   Ir [employer] Stent 1st Art Not Lillette Boxer Car Vert Car  02/01/2016  INDICATION: Acute symptomatic left lower extremity DVT, May-Thurner syndrome, 24 hours status post thrombolysis. EXAM: IR THROMB F/U EVAL ART/VEN SUBSEQ DAY (MS); LEFT EXTREMITY VENOGRAPHY; IR TRANSCATH PLC STENT 1ST ART NOT LE CV CAR VERT CAR COMPARISON:  02/01/2016, 01/31/2016 MEDICATIONS: 1% lidocaine locally ANESTHESIA/SEDATION: Fentanyl 150 mcg IV The patient was continuously monitored during the procedure by the interventional radiology nurse under my direct supervision. FLUOROSCOPY TIME:  Fluoroscopy Time: 4 minutes 30 seconds (70 mGy). COMPLICATIONS:  None immediate. TECHNIQUE: Informed written consent was obtained from the patient after a thorough discussion of the procedural risks, benefits and alternatives. All questions were addressed. Maximal Sterile Barrier Technique was utilized including caps, mask, sterile gowns, sterile gloves, sterile drape, hand hygiene and skin antiseptic. A timeout was performed prior to the initiation of the procedure. Under sterile conditions, follow-up left lower extremity venogram performed through the existing access. This demonstrates complete lysis of  the left iliofemoral DVT. No significant residual thrombus within the left lower extremity deep venous system. 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 Under sterile conditions and local anesthesia, the existing 6 French sheath was exchanged for a 7 French 50 cm sheath. The sheath was advanced into the left external iliac vein. Over an exchange guidewire, left iliac venogram was repeated. This confirms persistent occlusion of the left common iliac vein at the bifurcation. Initially, 12 mm venous angioplasty performed of the left common iliac venous occlusion. A single inflation was performed to approximately 15 atmospheres for 30 seconds. Balloon was deflated removed. Central venogram repeated. This demonstrates recanalization of the left common iliac occlusion. There is diffuse wall irregularity noted. The bifurcation can be identified. Lower IVC remains patent. Measurements obtained for the appropriate stent length and diameter. Stent placement: Under fluoroscopy, a 14 mm x 60 mm stent was deployed from the iliac bifurcation back into the left external iliac vein completely covering the common iliac occlusion. Post insertion, repeat venogram performed. This confirms successful recanalization of the left common iliac venous occlusion with restoration of  antegrade venous flow. No significant residual stenosis. No residual thrombus. Images obtained for documentation. Access removed. Hemostasis obtained with compression. FINDINGS: Imaging confirms lysis of the left lower extremity DVT with left common iliac venous occlusion treated successfully with a 14 x 60 mm self expanding stent. 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 This was successfully treated with a 14 mm x 60 mm self expanding stent insertion with restoration of antegrade flow. Electronically Signed   By: Judie Petit.  Shick M.D.   On: 02/01/2016 17:25   Ir Infusion Thrombol Venous Initial (ms)  01/31/2016  INDICATION: Symptomatic left lower extremity deep vein thrombosis. 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 Plan for catheter directed thrombolytic therapy. EXAM: LEFT LOWER EXTREMITY VENOGRAM; INITIATION OF CATHETER DIRECTED THROMBOLYSIS IN THE LEFT LOWER EXTREMITY AND PELVIS; ULTRASOUND GUIDANCE FOR VASCULAR ACCESS COMPARISON:  CT venogram 01/31/2016 MEDICATIONS: 25 mg Benadryl IV. The patient was pre-medicated with Benadryl and prednisone for Iohexol allergy. Patient tolerated the recent CT venogram without symptoms. ANESTHESIA/SEDATION: Versed 0.5 mg IV; Fentanyl 50 mcg IV Moderate Sedation Time:  11 minutes The patient was continuously monitored during the procedure by the interventional radiology nurse under my direct supervision. FLUOROSCOPY TIME:  Fluoroscopy Time: 4 minutes 36 seconds CONTRAST:  20 mL Omnipaque 300 COMPLICATIONS: None immediate. TECHNIQUE: Informed written consent was obtained from the patient after a thorough discussion of the procedural risks, benefits and alternatives. All questions were addressed. Maximal Sterile Barrier Technique was utilized including caps, mask, sterile gowns, sterile  gloves, sterile drape, hand hygiene and skin antiseptic. A timeout was performed prior to the initiation of the procedure. Patient was placed prone. The left popliteal region was prepped and draped in a sterile fashion. Skin was anesthetized with 1% lidocaine. 21 gauge needle directed into the occluded popliteal vein with ultrasound guidance. A micropuncture dilator set was placed. Small amount of contrast was injected to confirm placement in the vein. A Bentson wire was placed and a 6 [place] vascular sheath was placed. A 100 cm, 5 French catheter was easily advanced into the left common iliac vein. Small amount of contrast was injected to confirm thrombus throughout  the left femoral vein and left iliac veins. Collateral vessels were also identified. Glidewire was used to cannulate the IVC and catheter was advanced into the lower IVC. IVC venogram was performed. The 5 French catheter was removed and a 90 cm (50 cm infusion length) catheter was advanced over the Bentson wire. Tip of the catheter was placed in the distal IVC. Distal aspect of the catheter is in the distal femoral vein. Catheter directed thrombolysis was started at 0.4 mg/hr of tenecteplase. The sheath was sutured to the skin. Bandage placed over the sheath and infusion catheter. FINDINGS: Thrombosis of the left popliteal vein by ultrasound. Left lower extremity venogram confirmed thrombus throughout the left femoral vein and left iliac veins. The IVC is patent. Infusion catheter placed in the left femoral and left iliac veins. IMPRESSION: Initiation of catheter directed thrombolysis in the left lower extremity and pelvis. Plan for catheter directed thrombolysis overnight. Patient will return to Interventional Radiology on 02/01/2016 for follow-up venogram with possible balloon angioplasty and stent placement in the left common iliac vein. Electronically Signed   By: Richarda Overlie M.D.   On: 01/31/2016 17:33   Ir Rande Lawman F/u Eval Art/ven Final Day  (ms)  02/01/2016  INDICATION: Acute symptomatic left lower extremity DVT, May-Thurner syndrome, 24 hours status post thrombolysis. EXAM: IR THROMB F/U EVAL ART/VEN SUBSEQ DAY (MS); LEFT EXTREMITY VENOGRAPHY; IR TRANSCATH PLC STENT 1ST ART NOT LE CV CAR VERT CAR COMPARISON:  02/01/2016, 01/31/2016 MEDICATIONS: 1% lidocaine locally ANESTHESIA/SEDATION: Fentanyl 150 mcg IV The patient was continuously monitored during the procedure by the interventional radiology nurse under my direct supervision. FLUOROSCOPY TIME:  Fluoroscopy Time: 4 minutes 30 seconds (70 mGy). COMPLICATIONS: None immediate. TECHNIQUE: Informed written consent was obtained from the patient after a thorough discussion of the procedural risks, benefits and alternatives. All questions were addressed. Maximal Sterile Barrier Technique was utilized including caps, mask, sterile gowns, sterile gloves, sterile drape, hand hygiene and skin antiseptic. A timeout was performed prior to the initiation of the procedure. Under sterile conditions, follow-up left lower extremity venogram performed through the existing access. This demonstrates complete lysis of the left iliofemoral DVT. No significant residual thrombus within the left lower extremity deep venous system. 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 Under sterile conditions and local anesthesia, the existing 6 French sheath was exchanged for a 7 French 50 cm sheath. The sheath was advanced into the left external iliac vein. Over an exchange guidewire, left iliac venogram was repeated. This confirms persistent occlusion of the left common iliac vein at the bifurcation. Initially, 12 mm venous angioplasty performed of the left common iliac venous occlusion. A single inflation was performed to approximately 15 atmospheres for 30 seconds. Balloon was deflated removed. Central venogram  repeated. This demonstrates recanalization of the left common iliac occlusion. There is diffuse wall irregularity noted. The bifurcation can be identified. Lower IVC remains patent. Measurements obtained for the appropriate stent length and diameter. Stent placement: Under fluoroscopy, a 14 mm x 60 mm stent was deployed from the iliac bifurcation back into the left external iliac vein completely covering the common iliac occlusion. Post insertion, repeat venogram performed. This confirms successful recanalization of the left common iliac venous occlusion with restoration of antegrade venous flow. No significant residual stenosis. No residual thrombus. Images obtained for documentation. Access removed. Hemostasis obtained with compression. FINDINGS: Imaging confirms lysis of the left lower extremity DVT with  left common iliac venous occlusion treated successfully with a 14 x 60 mm self expanding stent. 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 This was successfully treated with a 14 mm x 60 mm self expanding stent insertion with restoration of antegrade flow. Electronically Signed   By: Judie Petit.  Shick M.D.   On: 02/01/2016 17:25   Micro Results      Recent Results (from the past 240 hour(s))  MRSA PCR Screening     Status: None   Collection Time: 01/31/16  4:59 PM  Result Value Ref Range Status   MRSA by PCR NEGATIVE NEGATIVE Final    Comment:        The GeneXpert MRSA Assay (FDA approved for NASAL specimens only), is one component of a comprehensive MRSA colonization surveillance program. It is not intended to diagnose MRSA infection nor to guide or monitor treatment for MRSA infections.        Today   Subjective    Thersia Petraglia today has no headache,no chest abdominal pain,no new weakness tingling or numbness, feels much better wants to go home today.     Objective   Blood  pressure 114/71, pulse 81, temperature 98.2 F (36.8 C), temperature source Oral, resp. rate 18, height 5\' 5"  (1.651 m), weight 52.8 kg (116 lb 6.5 oz), last menstrual period 10/01/2010, SpO2 97 %.   Intake/Output Summary (Last 24 hours) at 02/07/16 1158 Last data filed at 02/06/16 1800  Gross per 24 hour  Intake     50 ml  Output    302 ml  Net   -252 ml    Exam Awake Alert, Oriented x 3, No new F.N deficits, Normal affect .AT,PERRAL Supple Neck,No JVD, No cervical lymphadenopathy appriciated.  Symmetrical Chest wall movement, Good air movement bilaterally, CTAB RRR,No Gallops,Rubs or new Murmurs, No Parasternal Heave +ve B.Sounds, Abd Soft, Non tender, No organomegaly appriciated, No rebound -guarding or rigidity. No Cyanosis, Clubbing or edema, No new Rash or bruise   Data Review   CBC w Diff: Lab Results  Component Value Date   WBC 10.7* 02/06/2016   HGB 9.0* 02/07/2016   HCT 28.6* 02/07/2016   PLT 117* 02/06/2016   LYMPHOPCT 9 01/31/2016   MONOPCT 1 01/31/2016   EOSPCT 0 01/31/2016   BASOPCT 0 01/31/2016    CMP: Lab Results  Component Value Date   NA 143 02/07/2016   K 4.0 02/07/2016   CL 114* 02/07/2016   CO2 20* 02/07/2016   BUN <5* 02/07/2016   CREATININE 0.52 02/07/2016   PROT 4.3* 02/05/2016   ALBUMIN 1.5* 02/05/2016   BILITOT 0.5 02/05/2016   ALKPHOS 96 02/05/2016   AST 28 02/05/2016   ALT 23 02/05/2016  .   Total Time in preparing paper work, data evaluation and todays exam - 35 minutes  Leroy Sea M.D on 02/07/2016 at 11:58 AM  [facility]   Office  [phone]

## 2016-02-08 ENCOUNTER — Other Ambulatory Visit: Payer: Self-pay | Admitting: Radiology

## 2016-02-08 DIAGNOSIS — E1140 Type 2 diabetes mellitus with diabetic neuropathy, unspecified: Secondary | ICD-10-CM | POA: Diagnosis not present

## 2016-02-08 DIAGNOSIS — I82409 Acute embolism and thrombosis of unspecified deep veins of unspecified lower extremity: Secondary | ICD-10-CM

## 2016-02-08 DIAGNOSIS — I82432 Acute embolism and thrombosis of left popliteal vein: Secondary | ICD-10-CM | POA: Diagnosis not present

## 2016-02-08 DIAGNOSIS — I82412 Acute embolism and thrombosis of left femoral vein: Secondary | ICD-10-CM | POA: Diagnosis not present

## 2016-02-08 DIAGNOSIS — I82442 Acute embolism and thrombosis of left tibial vein: Secondary | ICD-10-CM | POA: Diagnosis not present

## 2016-02-08 DIAGNOSIS — N390 Urinary tract infection, site not specified: Secondary | ICD-10-CM | POA: Diagnosis not present

## 2016-02-08 DIAGNOSIS — I82492 Acute embolism and thrombosis of other specified deep vein of left lower extremity: Secondary | ICD-10-CM | POA: Diagnosis not present

## 2016-02-08 MED ORDER — LOPERAMIDE HCL 2 MG PO CAPS: 2.0000 mg | ORAL_CAPSULE | Freq: Once | ORAL | Status: DC

## 2016-02-08 MED ORDER — OXYCODONE-ACETAMINOPHEN 5-325 MG PO TABS: 1.0000 | ORAL_TABLET | Freq: Four times a day (QID) | ORAL | Status: DC | PRN

## 2016-02-08 MED ORDER — LOPERAMIDE HCL 2 MG PO CAPS: 2.0000 mg | ORAL_CAPSULE | Freq: Two times a day (BID) | ORAL | Status: DC | PRN

## 2016-02-08 NOTE — Progress Notes (Signed)
Pt discharge instruction and prescriptions given. Pt verbalized understanding. VSS. Denies pain at this time. Pt left floor via wheelchair accompanied by staff and patient.

## 2016-02-08 NOTE — Care Management Important Message (Signed)
Important Message  Patient Details  Name: [name] MRN: [id number] Date of Birth: 08-23-1961   Medicare Important Message Given:  Yes    Apolo Cutshaw P Praneeth Bussey 02/08/2016, 1:12 PM

## 2016-02-08 NOTE — Discharge Summary (Signed)
[name], is a 55 y.o. female  DOB 07-Mar-1961  MRN [id number].  Admission date:  01/29/2016  Admitting Physician  Jeralyn Bennett, MD  Discharge Date:  02/08/2016   Primary MD  No primary care provider on file.  Patient was not discharged yesterday as she did not have anyone at home to open door for her , no significant updates , patient laxative medication has been held getting loose bowel movement .  Recommendations for primary care physician for things to follow:   CBC, CMP next visit. Review CT scan findings. Monitor narcotic use.  Outpatient GI follow-up for possible fatty liver on CT scan and elevated LFTs.   Admission Diagnosis  DVT (deep venous thrombosis), left [I82.402]   Discharge Diagnosis  DVT (deep venous thrombosis), left [I82.402]     Principal Problem:   DVT (deep venous thrombosis), unspecified laterality Active Problems:   DM2 (diabetes mellitus, type 2) (HCC)   HTN (hypertension)   Normocytic anemia   History of pulmonary embolus (PE)   Severe protein-calorie malnutrition (HCC)   Poor venous access   Abdominal pain   Dyspnea   Pain in the chest      Past Medical History  Diagnosis Date  . Glaucoma   . Diabetes mellitus without complication (HCC)   . Hypertension   . Neuropathy (HCC)   . Crohn disease (HCC)   . Asthma   . Pulmonary embolus (HCC) 05/24/2014  . Hypothyroidism   . GERD (gastroesophageal reflux disease)   . HLD (hyperlipidemia)   . C. difficile colitis   . Pancreatitis   . Asthma   . Tobacco abuse     Past Surgical History  Procedure Laterality Date  . Cesarean section    . Esophagogastroduodenoscopy N/A 05/21/2014    Procedure: ESOPHAGOGASTRODUODENOSCOPY (EGD);  Surgeon: Petra Kuba, MD;  Location: [facility] ENDOSCOPY;  Service: Endoscopy;  Laterality: N/A;    . Esophagogastroduodenoscopy N/A 03/02/2015    Procedure: ESOPHAGOGASTRODUODENOSCOPY (EGD);  Surgeon: Bernette Redbird, MD;  Location: [facility] ENDOSCOPY;  Service: Endoscopy;  Laterality: N/A;  . Colonoscopy N/A 03/02/2015    Procedure: COLONOSCOPY;  Surgeon: Bernette Redbird, MD;  Location: [facility] ENDOSCOPY;  Service: Endoscopy;  Laterality: N/A;       HPI  from the history and physical done on the day of admission:    55 y.o. female with multiple comorbidities including type 2 diabetes mellitus, hypothyroidism, severe protein calorie malnutrition, history of pulmonary embolism, presented to the emergency department 4/30 with complaints of Left lower extremity pain associated with edema and erythema x 2 days. Upon presentation to the ER she was found to have 10/10 pain to that leg with decreased mobility. She had bedside ultrasound in ED which demonstrated occlusive DVT throughout the L lower extremity and she was started on IV heparin. 5/2 she went to IR for venous catheter placement for thrombolysis, post-procedure she was transferred to ICU for continued lytics. Once procedure was complete, she was transferred to floor and TRH took over 5/5.  Note patient  has been very uncooperative with the staff, PT and nursing, she is refusing to get out of the bed or sit up in the recliner, refusing to participate with PT, every day she becomes tearful when it's time to go home or she is told she is being discharged. On the night of 02/05/2016 when she was told she is going home next morning she started complaining of abdominal pain, workup showed only large stool burden in her colon. Morning of 02/06/2016 she is eating breakfast comfortably appears to be in no distress with benign abdominal exam. She has been told again and again that she will be discharged finally on 02/07/2016.   On 02/05/2016 - PT eval, patient refused to go today, refused to work with PT, refused to sit up in the recliner on 02/04/2016. On 02/05/2016  she refused to go home and requested me to keep her here one more day for PT eval. She promised she would cooperate today and will go home tomorrow. She subsequently refused to go home and wanted to go to a SNF, SNF was arranged she then refused going to SNF and wanted to go home with home PT which has been arranged. Please review social work note as well along with nursing notes.   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 She has prior history of PE, was off of anticoagulation for the past year as she completed course. Tolerated Eliquis well in the past, have transitioned her from heparin drip to Eliquis on 02/04/2016, will monitor H&H which has remained stable.  Anemia - drop in Hb to 6.8 on 5/3 without clinical bleeding, s/p 1U pRBC, Hb 9.1 >> 8.9, hemoglobin stable with PCP to repeat CBC next admission.  Shortness of breath. - On admission she reported having some shortness of breath and was tachycardic in the emergency department , Initially ordered a CT PE study however was informed of her contrast allergy. Ordered a VQ scan however she was unable to tolerate procedure. Transthoracic echocardiogram as below, normal EF, RV looks good. Symptoms have Resolved.  History of GI bleed - She had been hospitalized in June 2016 for GI bleed while on Eliquis tx. Aparently this was in the setting of Crohn's colitis.She has had no issues with GI bleed in the last 2 years, continue to monitor closely. We'll have her follow with her GI physician post discharge. Her Crohn's is currently in remission for the last 2 years.   Elevated transaminases.  Presented with elevated AST, ALT and alkaline phosphatase. She has a history elevated liver enzymes.  She had a recent CT of abdomen performed on 01/09/2016 that revealed hepatic steatosis. Follow with GI  outpatient. Liver enzymes trending down.   Hypertension - stable continue home regimen unchanged.   Abdominal pain night of 02/05/2016. CT abdomen and pelvis shows large stone burden suggestive of narcotic bowel, patient takes narcotics regularly at home, initiate bowel regimen, having breakfast this morning appears comfortable, exam benign. No further workup.   1 isolated borderline troponin of 0.06. EKG nonacute. This was obtained on 02/05/2016 night for abdominal pain. Recent echo unremarkable. On Eliquis continue, 1 troponin which was second set was borderline positive one before and after negative suggesting that this was a false positive troponin.  UTI. Treated with Rocephin 2 doses, place on 2 more days of Vantin and  discharge.   Follow UP  Follow-up Information    Follow up with Berdine Dance, MD. Go in 4 weeks.   Specialty:  Interventional Radiology   Why:  Post procedure eval. Office will contact you with appointment date/time   Contact information:   [address] [place] [address] [phone]       Follow up with [facility].   Specialty:  Home Health Services   Why:  local office number, [phone]. office will call to arrange for Home Health   Contact information:   [address] [place] [address] [phone]       Follow up with [facility] On 02/14/2016.   Specialty:  Internal Medicine   Why:  at 10:45am for an appointment wiht Dr Carleene Overlie information:   [address] [id number] mc [address] [place] [address] [phone]       Consults obtained - None  Discharge Condition: Stable  Diet and Activity recommendation: See Discharge Instructions below  Discharge Instructions           Discharge Instructions    Diet - low sodium heart healthy    Complete by:  As directed      Discharge instructions    Complete by:  As directed   Follow with  Primary MD in 2-3 days   Get CBC, CMP, 2 view Chest X ray checked  by Primary MD next visit.    Activity: As tolerated with Full fall precautions use walker/cane & assistance as needed   Disposition Home     Diet:   Heart Healthy  .  For Heart failure patients - Check your Weight same time everyday, if you gain over 2 pounds, or you develop in leg swelling, experience more shortness of breath or chest pain, call your Primary MD immediately. Follow Cardiac Low Salt Diet and 1.5 lit/day fluid restriction.   On your next visit with your primary care physician please Get Medicines reviewed and adjusted.   Please request your Prim.MD to go over all Hospital Tests and Procedure/Radiological results at the follow up, please get all Hospital records sent to your Prim MD by signing hospital release before you go home.   If you experience worsening of your admission symptoms, develop shortness of breath, life threatening emergency, suicidal or homicidal thoughts you must seek medical attention immediately by calling 911 or calling your MD immediately  if symptoms less severe.  You Must read complete instructions/literature along with all the possible adverse reactions/side effects for all the Medicines you take and that have been prescribed to you. Take any new Medicines after you have completely understood and accpet all the possible adverse reactions/side effects.   Do not drive, operating heavy machinery, perform activities at heights, swimming or participation in water activities or provide baby sitting services if your were admitted for syncope or siezures until you have seen by Primary MD or a Neurologist and advised to do so again.  Do not drive when taking Pain medications.    Do not take more than prescribed Pain, Sleep and Anxiety Medications  Special Instructions: If you have smoked or chewed Tobacco  in the last 2 yrs please stop smoking, stop any regular Alcohol  and or any  Recreational drug use.  Wear Seat belts while driving.   Please note  You were cared for by a hospitalist during your hospital stay. If you have  any questions about your discharge medications or the care you received while you were in the hospital after you are discharged, you can call the unit and asked to speak with the hospitalist on call if the hospitalist that took care of you is not available. Once you are discharged, your primary care physician will handle any further medical issues. Please note that NO REFILLS for any discharge medications will be authorized once you are discharged, as it is imperative that you return to your primary care physician (or establish a relationship with a primary care physician if you do not have one) for your aftercare needs so that they can reassess your need for medications and monitor your lab values.     Increase activity slowly    Complete by:  As directed              Discharge Medications       Medication List    STOP taking these medications        cephALEXin 500 MG capsule  Commonly known as:  KEFLEX      TAKE these medications        apixaban 5 MG Tabs tablet  Commonly known as:  ELIQUIS  Take 2 tablets (10 mg total) by mouth 2 (two) times daily.     apixaban 5 MG Tabs tablet  Commonly known as:  ELIQUIS  Take 1 tablet (5 mg total) by mouth 2 (two) times daily.  Start taking on:  02/11/2016     calcium carbonate 1250 (500 Ca) MG tablet  Commonly known as:  OS-CAL - dosed in mg of elemental calcium  Take 1 tablet (500 mg of elemental calcium total) by mouth daily with breakfast.     cefpodoxime 200 MG tablet  Commonly known as:  VANTIN  Take 1 tablet (200 mg total) by mouth 2 (two) times daily.     docusate sodium 100 MG capsule  Commonly known as:  COLACE  Take 2 capsules (200 mg total) by mouth 2 (two) times daily.     feeding supplement Liqd  Take 1 Container by mouth 3 (three) times daily between meals.     folic  acid 1 MG tablet  Commonly known as:  FOLVITE  Take 1 tablet (1 mg total) by mouth daily.     gabapentin 100 MG capsule  Commonly known as:  NEURONTIN  Take 1 capsule (100 mg total) by mouth 2 (two) times daily.     HYDROcodone-acetaminophen 5-325 MG tablet  Commonly known as:  NORCO/VICODIN  Take 1 tablet by mouth every 6 (six) hours as needed.     HYDROmorphone 4 MG tablet  Commonly known as:  DILAUDID  Take 8 mg by mouth every 4 (four) hours as needed for severe pain.     lactulose 10 GM/15ML solution  Commonly known as:  CHRONULAC  Take 15 mLs (10 g total) by mouth daily as needed for moderate constipation.     levothyroxine 25 MCG tablet  Commonly known as:  SYNTHROID, LEVOTHROID  Take 1 tablet (25 mcg total) by mouth daily before breakfast.     loperamide 2 MG capsule  Commonly known as:  IMODIUM  Take 1 capsule (2 mg total) by mouth 2 (two) times daily as needed for diarrhea or loose stools.     meclizine 25 MG tablet  Commonly known as:  ANTIVERT  Take 25 mg by mouth 3 (three) times daily as needed for dizziness.     metoprolol  tartrate 25 MG tablet  Commonly known as:  LOPRESSOR  Take 0.5 tablets (12.5 mg total) by mouth 2 (two) times daily.     ondansetron 4 MG disintegrating tablet  Commonly known as:  ZOFRAN ODT  Take 1 tablet (4 mg total) by mouth every 8 (eight) hours as needed for nausea or vomiting.     ondansetron 4 MG tablet  Commonly known as:  ZOFRAN  Take 4 mg by mouth every 8 (eight) hours as needed for nausea or vomiting.     oxyCODONE-acetaminophen 5-325 MG tablet  Commonly known as:  PERCOCET/ROXICET  Take 1 tablet by mouth every 6 (six) hours as needed for moderate pain.     pantoprazole 20 MG tablet  Commonly known as:  PROTONIX  Take 40 mg by mouth daily.     polyethylene glycol packet  Commonly known as:  MIRALAX / GLYCOLAX  Take 17 g by mouth daily.     rosuvastatin 20 MG tablet  Commonly known as:  CRESTOR  Take 1 tablet (20 mg  total) by mouth daily.     saccharomyces boulardii 250 MG capsule  Commonly known as:  FLORASTOR  Take 1 capsule (250 mg total) by mouth 2 (two) times daily.     thiamine 100 MG tablet  Commonly known as:  VITAMIN B-1  Take 1 tablet (100 mg total) by mouth daily.     traZODone 25 mg Tabs tablet  Commonly known as:  DESYREL  Take 0.5 tablets (25 mg total) by mouth at bedtime as needed for sleep.     venlafaxine 25 MG tablet  Commonly known as:  EFFEXOR  Take 1 tablet (25 mg total) by mouth 2 (two) times daily.        Major procedures and Radiology Reports - PLEASE review detailed and final reports for all details, in brief -    2D echo - Left ventricle: The cavity size was normal. Systolic function was normal. The estimated ejection fraction was in the range of 55% to 60%. Wall motion was normal; there were no regional wall motion abnormalities. Doppler parameters are consistent with abnormal left ventricular relaxation (grade 1 diastolic dysfunction). Acoustic contrast opacification revealed no evidence of thrombus.    Ct Abdomen Pelvis Wo Contrast  02/06/2016  CLINICAL DATA:  Abdominal pain, diffuse. EXAM: CT ABDOMEN AND PELVIS WITHOUT CONTRAST TECHNIQUE: Multidetector CT imaging of the abdomen and pelvis was performed following the standard protocol without IV contrast. COMPARISON:  Radiographs 02/05/2016.  CT 01/09/2016. FINDINGS: In the lower chest, there is posterior left lung base opacity which may be atelectatic but infectious infiltrate cannot be excluded. There is a 2 cm mass and the liver dome, present over the past several examinations dating back to at least 07/30/2011. There are otherwise unremarkable unenhanced appearances of the liver and bile ducts. There are unremarkable unenhanced appearances of the spleen, pancreas, adrenals and renal parenchyma. Multiple 2-3 mm collecting system calculi are present in both kidneys but there is no evidence of ureteral obstruction.  There is no ureteral calculus. Urinary bladder is nearly empty, with a Foley catheter, and appears grossly unremarkable. The abdominal aorta is normal in caliber with mild atherosclerotic calcification. There are vascular stents within the left common iliac vein. The colon is moderately distended with air and stool. No evidence of bowel obstruction. Unremarkable small bowel and stomach. No acute inflammatory changes are evident in the abdomen or pelvis. There is no adenopathy. There is no ascites. There is no significant  musculoskeletal lesion. IMPRESSION: 1. Moderate colonic distention with stool and air. Negative for bowel obstruction or perforation 2. No acute inflammatory changes are evident in the abdomen or pelvis. 3. Left lung base atelectasis or infiltrate. 4. Bilateral nephrolithiasis. No ureteral calculus. No evidence of ureteral obstruction Electronically Signed   By: Ellery Plunk M.D.   On: 02/06/2016 02:27   Ct Abdomen Pelvis Wo Contrast  01/09/2016  CLINICAL DATA:  Abdominal pain with nausea and vomiting for 2 days. History of Crohn's disease and pancreatitis EXAM: CT ABDOMEN AND PELVIS WITHOUT CONTRAST TECHNIQUE: Multidetector CT imaging of the abdomen and pelvis was performed following the standard protocol without IV contrast. COMPARISON:  Feb 28, 2015 FINDINGS: Lower chest: There is patchy atelectasis in the lateral left base region. There is a lesser degree of atelectatic change in the posterior and lateral right base regions. Lung bases otherwise are clear. There are multiple foci of coronary artery calcification. Hepatobiliary: There is hepatic steatosis. There is again noted a mass in the dome of the liver anteriorly measuring 2.2 x 1.4 cm. There is minimal calcification along the periphery of this lesion. No other focal lesion is identified on this noncontrast enhanced study. The gallbladder wall is not appreciably thickened. The common bile duct measures 14 mm, stable from prior  studies. No biliary duct mass or calculus is evident. Pancreas: There is no pancreatic mass or inflammatory focus. Mild dilatation of the pancreatic duct is a stable finding. Spleen: No splenic lesions are evident. Adrenals/Urinary Tract: Adrenals appear unremarkable and stable bilaterally. There is no hydronephrosis on either side. There is a 4 mm cyst in the periphery of the right kidney. There is a 2 mm calculus in the upper pole of the right kidney. There is a 2 mm calculus in the lower pole of the right kidney. There is a 2 mm calculus in the mid right kidney. On the left, there is a 1 mm calculus in the upper to midportion. No ureteral calculi are identified on either side. The urinary bladder is midline with wall thickness within normal limits. Stomach/Bowel: There is evidence of previous surgery in left pelvis with anastomosis patent. There is no bowel wall or mesenteric thickening. There is no bowel obstruction. No free air or portal venous air. The terminal ileal region appears unremarkable on this study. Vascular/Lymphatic: There is atherosclerotic calcification throughout the aorta and common iliac arteries. There is also calcification in both external iliac and hypogastric arteries. No abdominal aortic aneurysm is seen. The major mesenteric vessels appear patent on this noncontrast enhanced study. There is no adenopathy in the abdomen or pelvis. Reproductive: Uterus is anteverted. There is no pelvic mass or pelvic fluid collection. Other: Appendix appears normal. No ascites or abscess is appreciable in the abdomen or pelvis. There is a minimal ventral hernia containing only fat. There is chronic scarring in the posterior perineal region, stable. Musculoskeletal: There is no blastic or lytic bone lesions. There is degenerative type change in the lumbar spine. There is no intramuscular or abdominal wall lesion. IMPRESSION: No bowel obstruction. No appreciable bowel wall thickening. Postoperative changes  noted in the left lower pelvis. Appendix appears normal. There are small nonobstructing calculi in each kidney. No hydronephrosis or ureteral calculus on either side. Hepatic steatosis. Well-circumscribed in the dome of the liver, felt to be of benign etiology. This lesion now does show mild peripheral calcification. Question old hematoma. Areas of patchy atelectasis in lung bases, primarily in the lateral left base. This area in  the lateral left base potentially could represent the earliest changes of pneumonia. Coronary artery calcification noted. Stable chronic scarring in the posterior perineal region. Electronically Signed   By: Bretta Bang III M.D.   On: 01/09/2016 16:10   Ir Veno/ext/uni Left  02/01/2016  INDICATION: Symptomatic left lower extremity deep vein thrombosis. 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 Plan for catheter directed thrombolytic therapy. EXAM: LEFT LOWER EXTREMITY VENOGRAM; INITIATION OF CATHETER DIRECTED THROMBOLYSIS IN THE LEFT LOWER EXTREMITY AND PELVIS; ULTRASOUND GUIDANCE FOR VASCULAR ACCESS COMPARISON:  CT venogram 01/31/2016 MEDICATIONS: 25 mg Benadryl IV. The patient was pre-medicated with Benadryl and prednisone for Iohexol allergy. Patient tolerated the recent CT venogram without symptoms. ANESTHESIA/SEDATION: Versed 0.5 mg IV; Fentanyl 50 mcg IV Moderate Sedation Time:  11 minutes The patient was continuously monitored during the procedure by the interventional radiology nurse under my direct supervision. FLUOROSCOPY TIME:  Fluoroscopy Time: 4 minutes 36 seconds CONTRAST:  20 mL Omnipaque 300 COMPLICATIONS: None immediate. TECHNIQUE: Informed written consent was obtained from the patient after a thorough discussion of the procedural risks, benefits and alternatives. All questions were addressed. Maximal Sterile Barrier Technique was utilized including caps, mask, sterile gowns, sterile  gloves, sterile drape, hand hygiene and skin antiseptic. A timeout was performed prior to the initiation of the procedure. Patient was placed prone. The left popliteal region was prepped and draped in a sterile fashion. Skin was anesthetized with 1% lidocaine. 21 gauge needle directed into the occluded popliteal vein with ultrasound guidance. A micropuncture dilator set was placed. Small amount of contrast was injected to confirm placement in the vein. A Bentson wire was placed and a 6 [place] vascular sheath was placed. A 100 cm, 5 French catheter was easily advanced into the left common iliac vein. Small amount of contrast was injected to confirm thrombus throughout the left femoral vein and left iliac veins. Collateral vessels were also identified. Glidewire was used to cannulate the IVC and catheter was advanced into the lower IVC. IVC venogram was performed. The 5 French catheter was removed and a 90 cm (50 cm infusion length) catheter was advanced over the Bentson wire. Tip of the catheter was placed in the distal IVC. Distal aspect of the catheter is in the distal femoral vein. Catheter directed thrombolysis was started at 0.4 mg/hr of tenecteplase. The sheath was sutured to the skin. Bandage placed over the sheath and infusion catheter. FINDINGS: Thrombosis of the left popliteal vein by ultrasound. Left lower extremity venogram confirmed thrombus throughout the left femoral vein and left iliac veins. The IVC is patent. Infusion catheter placed in the left femoral and left iliac veins. IMPRESSION: Initiation of catheter directed thrombolysis in the left lower extremity and pelvis. Plan for catheter directed thrombolysis overnight. Patient will return to Interventional Radiology on 02/01/2016 for follow-up venogram with possible balloon angioplasty and stent placement in the left common iliac vein. Electronically Signed   By: Richarda Overlie M.D.   On: 01/31/2016 17:33   Ir Venocavagram Ivc  02/01/2016   INDICATION: Symptomatic left lower extremity deep vein thrombosis. 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 Plan for catheter directed thrombolytic therapy. EXAM: LEFT LOWER EXTREMITY VENOGRAM; INITIATION OF CATHETER DIRECTED THROMBOLYSIS IN THE LEFT LOWER EXTREMITY AND PELVIS; ULTRASOUND GUIDANCE FOR VASCULAR ACCESS COMPARISON:  CT venogram 01/31/2016 MEDICATIONS: 25 mg Benadryl IV.  The patient was pre-medicated with Benadryl and prednisone for Iohexol allergy. Patient tolerated the recent CT venogram without symptoms. ANESTHESIA/SEDATION: Versed 0.5 mg IV; Fentanyl 50 mcg IV Moderate Sedation Time:  11 minutes The patient was continuously monitored during the procedure by the interventional radiology nurse under my direct supervision. FLUOROSCOPY TIME:  Fluoroscopy Time: 4 minutes 36 seconds CONTRAST:  20 mL Omnipaque 300 COMPLICATIONS: None immediate. TECHNIQUE: Informed written consent was obtained from the patient after a thorough discussion of the procedural risks, benefits and alternatives. All questions were addressed. Maximal Sterile Barrier Technique was utilized including caps, mask, sterile gowns, sterile gloves, sterile drape, hand hygiene and skin antiseptic. A timeout was performed prior to the initiation of the procedure. Patient was placed prone. The left popliteal region was prepped and draped in a sterile fashion. Skin was anesthetized with 1% lidocaine. 21 gauge needle directed into the occluded popliteal vein with ultrasound guidance. A micropuncture dilator set was placed. Small amount of contrast was injected to confirm placement in the vein. A Bentson wire was placed and a 6 [place] vascular sheath was placed. A 100 cm, 5 French catheter was easily advanced into the left common iliac vein. Small amount of contrast was injected to confirm thrombus throughout the left femoral vein and left iliac veins.  Collateral vessels were also identified. Glidewire was used to cannulate the IVC and catheter was advanced into the lower IVC. IVC venogram was performed. The 5 French catheter was removed and a 90 cm (50 cm infusion length) catheter was advanced over the Bentson wire. Tip of the catheter was placed in the distal IVC. Distal aspect of the catheter is in the distal femoral vein. Catheter directed thrombolysis was started at 0.4 mg/hr of tenecteplase. The sheath was sutured to the skin. Bandage placed over the sheath and infusion catheter. FINDINGS: Thrombosis of the left popliteal vein by ultrasound. Left lower extremity venogram confirmed thrombus throughout the left femoral vein and left iliac veins. The IVC is patent. Infusion catheter placed in the left femoral and left iliac veins. IMPRESSION: Initiation of catheter directed thrombolysis in the left lower extremity and pelvis. Plan for catheter directed thrombolysis overnight. Patient will return to Interventional Radiology on 02/01/2016 for follow-up venogram with possible balloon angioplasty and stent placement in the left common iliac vein. Electronically Signed   By: Richarda Overlie M.D.   On: 01/31/2016 17:33   Ir US Guide Vasc Access Left  01/31/2016  INDICATION: Symptomatic left lower extremity deep vein thrombosis. 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 Plan for catheter directed thrombolytic therapy. EXAM: LEFT LOWER EXTREMITY VENOGRAM; INITIATION OF CATHETER DIRECTED THROMBOLYSIS IN THE LEFT LOWER EXTREMITY AND PELVIS; ULTRASOUND GUIDANCE FOR VASCULAR ACCESS COMPARISON:  CT venogram 01/31/2016 MEDICATIONS: 25 mg Benadryl IV. The patient was pre-medicated with Benadryl and prednisone for Iohexol allergy. Patient tolerated the recent CT venogram without symptoms. ANESTHESIA/SEDATION: Versed 0.5 mg IV; Fentanyl 50 mcg IV Moderate Sedation Time:  11 minutes The patient was  continuously monitored during the procedure by the interventional radiology nurse under my direct supervision. FLUOROSCOPY TIME:  Fluoroscopy Time: 4 minutes 36 seconds CONTRAST:  20 mL Omnipaque 300 COMPLICATIONS: None immediate. TECHNIQUE: Informed written consent was obtained from the patient after a thorough discussion of the procedural risks, benefits and alternatives. All questions were addressed. Maximal Sterile Barrier Technique was utilized including caps, mask, sterile gowns, sterile gloves, sterile drape, hand hygiene and  skin antiseptic. A timeout was performed prior to the initiation of the procedure. Patient was placed prone. The left popliteal region was prepped and draped in a sterile fashion. Skin was anesthetized with 1% lidocaine. 21 gauge needle directed into the occluded popliteal vein with ultrasound guidance. A micropuncture dilator set was placed. Small amount of contrast was injected to confirm placement in the vein. A Bentson wire was placed and a 6 [place] vascular sheath was placed. A 100 cm, 5 French catheter was easily advanced into the left common iliac vein. Small amount of contrast was injected to confirm thrombus throughout the left femoral vein and left iliac veins. Collateral vessels were also identified. Glidewire was used to cannulate the IVC and catheter was advanced into the lower IVC. IVC venogram was performed. The 5 French catheter was removed and a 90 cm (50 cm infusion length) catheter was advanced over the Bentson wire. Tip of the catheter was placed in the distal IVC. Distal aspect of the catheter is in the distal femoral vein. Catheter directed thrombolysis was started at 0.4 mg/hr of tenecteplase. The sheath was sutured to the skin. Bandage placed over the sheath and infusion catheter. FINDINGS: Thrombosis of the left popliteal vein by ultrasound. Left lower extremity venogram confirmed thrombus throughout the left femoral vein and left iliac veins. The IVC is patent.  Infusion catheter placed in the left femoral and left iliac veins. IMPRESSION: Initiation of catheter directed thrombolysis in the left lower extremity and pelvis. Plan for catheter directed thrombolysis overnight. Patient will return to Interventional Radiology on 02/01/2016 for follow-up venogram with possible balloon angioplasty and stent placement in the left common iliac vein. Electronically Signed   By: Richarda Overlie M.D.   On: 01/31/2016 17:33   Ir US Guide Vasc Access Right  01/31/2016  INDICATION: 55 year old with left lower extremity DVT. Poor venous access and patient needs CT examination and IV access. EXAM: RIGHT ARM PICC LINE PLACEMENT WITH ULTRASOUND AND FLUOROSCOPIC GUIDANCE MEDICATIONS: None ANESTHESIA/SEDATION: None FLUOROSCOPY TIME:  Fluoroscopy Time: 18 seconds, 0.2 mGy COMPLICATIONS: None immediate. PROCEDURE: The patient was advised of the possible risks and complications and agreed to undergo the procedure. The patient was then brought to the angiographic suite for the procedure. The right arm was prepped with chlorhexidine, draped in the usual sterile fashion using maximum barrier technique (cap and mask, sterile gown, sterile gloves, large sterile sheet, hand hygiene and cutaneous antiseptic). Local anesthesia was attained by infiltration with 1% lidocaine. Ultrasound demonstrated patency of the right brachial vein, and this was documented with an image. Under real-time ultrasound guidance, this vein was accessed with a 21 gauge micropuncture needle and image documentation was performed. The needle was exchanged over a guidewire for a peel-away sheath through which a 32 cm 5 [place] dual lumen power injectable PICC was advanced, and positioned with its tip at the lower SVC/right atrial junction. Fluoroscopy during the procedure and fluoro spot radiograph confirms appropriate catheter position. The catheter was flushed, secured to the skin, and covered with a sterile dressing. IMPRESSION:  Successful placement of a right arm PICC with sonographic and fluoroscopic guidance. The catheter is ready for use. Electronically Signed   By: Richarda Overlie M.D.   On: 01/31/2016 12:30   Dg Chest Port 1 View  02/05/2016  CLINICAL DATA:  Shortness of Breath. EXAM: PORTABLE CHEST 1 VIEW COMPARISON:  Chest x-ray 01/29/2016 FINDINGS: Right PICC line tip is in the distal SVC just above the cavoatrial junction. The heart is normal  in size. The mediastinal and hilar contours are normal. The lungs are clear. Prominent skin fold noted over the right chest but no definite pneumothorax. Possible very small left pleural effusion. The bony thorax is intact. IMPRESSION: No acute cardiopulmonary findings. Possible very small left pleural effusion. Right PICC line in good position. Electronically Signed   By: Rudie Meyer M.D.   On: 02/05/2016 20:40   Dg Chest Port 1 View  01/29/2016  CLINICAL DATA:  Shortness of breath EXAM: PORTABLE CHEST 1 VIEW COMPARISON:  CTA chest dated 03/31/2015 FINDINGS: Patient is rotated and her chin obscures the right lung apex. Lungs are clear.  No pleural effusion or pneumothorax. Heart is normal in size. IMPRESSION: No evidence of acute cardiopulmonary disease. Electronically Signed   By: Charline Bills M.D.   On: 01/29/2016 19:00   Dg Abd Portable 1v  02/05/2016  CLINICAL DATA:  Abdominal pain. EXAM: PORTABLE ABDOMEN - 1 VIEW COMPARISON:  None. FINDINGS: The bowel gas pattern is unremarkable. No dilated bowel loops are present. A left common iliac venous stent is noted. No acute bony abnormalities noted. IMPRESSION: No acute abnormalities. Electronically Signed   By: Harmon Pier M.D.   On: 02/05/2016 20:40   Ir Fluoro Guide Cv Midline Picc Right  01/31/2016  INDICATION: 55 year old with left lower extremity DVT. Poor venous access and patient needs CT examination and IV access. EXAM: RIGHT ARM PICC LINE PLACEMENT WITH ULTRASOUND AND FLUOROSCOPIC GUIDANCE MEDICATIONS: None  ANESTHESIA/SEDATION: None FLUOROSCOPY TIME:  Fluoroscopy Time: 18 seconds, 0.2 mGy COMPLICATIONS: None immediate. PROCEDURE: The patient was advised of the possible risks and complications and agreed to undergo the procedure. The patient was then brought to the angiographic suite for the procedure. The right arm was prepped with chlorhexidine, draped in the usual sterile fashion using maximum barrier technique (cap and mask, sterile gown, sterile gloves, large sterile sheet, hand hygiene and cutaneous antiseptic). Local anesthesia was attained by infiltration with 1% lidocaine. Ultrasound demonstrated patency of the right brachial vein, and this was documented with an image. Under real-time ultrasound guidance, this vein was accessed with a 21 gauge micropuncture needle and image documentation was performed. The needle was exchanged over a guidewire for a peel-away sheath through which a 32 cm 5 [place] dual lumen power injectable PICC was advanced, and positioned with its tip at the lower SVC/right atrial junction. Fluoroscopy during the procedure and fluoro spot radiograph confirms appropriate catheter position. The catheter was flushed, secured to the skin, and covered with a sterile dressing. IMPRESSION: Successful placement of a right arm PICC with sonographic and fluoroscopic guidance. The catheter is ready for use. Electronically Signed   By: Richarda Overlie M.D.   On: 01/31/2016 12:30   Ct Venogram Abd/pel  01/31/2016  CLINICAL DATA:  55 year old with a left lower extremity DVT and complains of left leg pain and swelling. Patient is being evaluated for possible catheter directed thrombolytic therapy. Recent venous duplex examination could not evaluate the iliac venous system. Evaluate for pelvic DVT and May-Thurner syndrome. EXAM: CTA ABDOMEN AND PELVIS WITH CONTRAST (CT VENOGRAM) TECHNIQUE: Multidetector CT imaging of the abdomen and pelvis was performed using the standard protocol during bolus administration  of intravenous contrast. Multiplanar reconstructed images and MIPs were obtained and reviewed to evaluate the vascular anatomy. CONTRAST:  ISOVUE-300 IOPAMIDOL (ISOVUE-300) INJECTION 61% COMPARISON:  01/09/2016 FINDINGS: Venous structures: Hepatic veins, IVC and bilateral renal veins are patent. There is a small amount of thrombus in the distal IVC extending from  the left common iliac vein. 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 There is thrombus in the left common and left external iliac vein. There is edema throughout the left thigh and evidence for filling defects in the left common femoral vein and left femoral vein. Right femoral veins and right iliac veins are patent without distension. Portal venous system is patent. Arterial structures: Atherosclerotic disease in the aorta and iliac arteries. There is at least mild narrowing in the right common iliac artery. Circumferential calcifications in the superficial femoral arteries bilaterally. Lower chest: There is a 2.0 cm nodular pleural-based density along the lateral left lower lobe on sequence 3, image 17. Previously, there were pleural-based densities in this area but this area has clearly enlarged from 01/09/2016. The pleural-based structure has fluid density and could represent a small amount of loculated fluid. Otherwise, the lung bases are clear. Nonvascular structures: Again noted is a 1.9 cm low-density structure along the hepatic dome. This could be related to a cyst. Low density throughout the liver parenchyma is suggestive for hepatic steatosis. The distal common bile duct is enlarged measuring up to 1.9 cm and previously measured 1.4 cm. There is no significant inflammation. No significant enlargement of the gallbladder. Dilatation of the main pancreatic duct without inflammatory changes. Normal appearance of spleen without  enlargement. Normal appearance of both adrenal glands. No acute abnormality to either kidney. Again noted is a small cortical calcification involving the right kidney lower pole. No acute abnormality to the stomach, small bowel or colon. There is some distension of the rectum. Normal appearance of the appendix. There appears to be a small uterus but poorly characterized. Small amount of fluid and edema in the abdomen. Extensive edema throughout the lower abdominal and pelvic subcutaneous tissues. Subcutaneous edema in the left upper extremity. Again noted is soft tissue fullness in the perineum. No acute bone abnormality. Review of the MIP images confirms the above findings. IMPRESSION: Left lower extremity deep vein thrombosis extends into the left iliac veins with a small amount of clot extending into the distal IVC. In addition, there is compression of the left common iliac vein from the right common iliac artery. 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 Extensive subcutaneous edema throughout the left lower extremity and pelvis. Increased distension of the common bile duct of uncertain etiology. This structure has been dilated in the past but appears to be enlarging. Patient does have elevated liver enzymes. Recommend GI consultation and consider further evaluation with MRCP. Pancreatic duct is also dilated. Hepatic steatosis. 2 cm pleural-based density in the left lower lobe may be related to fluid but this has increased since an exam on 01/09/2016. Recommend a 6-8 week follow-up chest CT to ensure resolution. Electronically Signed   By: Richarda Overlie M.D.   On: 01/31/2016 16:13   Ir [employer] Stent 1st Art Not Lillette Boxer Car Vert Car  02/01/2016  INDICATION: Acute symptomatic left lower extremity DVT, May-Thurner syndrome, 24 hours status post thrombolysis. EXAM: IR THROMB F/U EVAL ART/VEN SUBSEQ DAY (MS); LEFT EXTREMITY VENOGRAPHY; IR TRANSCATH PLC STENT 1ST ART  NOT LE CV CAR VERT CAR COMPARISON:  02/01/2016, 01/31/2016 MEDICATIONS: 1% lidocaine locally ANESTHESIA/SEDATION: Fentanyl 150 mcg IV The patient was continuously monitored during the procedure by the interventional radiology nurse under my direct supervision. FLUOROSCOPY TIME:  Fluoroscopy Time: 4 minutes 30 seconds (70 mGy). COMPLICATIONS:  None immediate. TECHNIQUE: Informed written consent was obtained from the patient after a thorough discussion of the procedural risks, benefits and alternatives. All questions were addressed. Maximal Sterile Barrier Technique was utilized including caps, mask, sterile gowns, sterile gloves, sterile drape, hand hygiene and skin antiseptic. A timeout was performed prior to the initiation of the procedure. Under sterile conditions, follow-up left lower extremity venogram performed through the existing access. This demonstrates complete lysis of the left iliofemoral DVT. No significant residual thrombus within the left lower extremity deep venous system. 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 Under sterile conditions and local anesthesia, the existing 6 French sheath was exchanged for a 7 French 50 cm sheath. The sheath was advanced into the left external iliac vein. Over an exchange guidewire, left iliac venogram was repeated. This confirms persistent occlusion of the left common iliac vein at the bifurcation. Initially, 12 mm venous angioplasty performed of the left common iliac venous occlusion. A single inflation was performed to approximately 15 atmospheres for 30 seconds. Balloon was deflated removed. Central venogram repeated. This demonstrates recanalization of the left common iliac occlusion. There is diffuse wall irregularity noted. The bifurcation can be identified. Lower IVC remains patent. Measurements obtained for the appropriate stent length and  diameter. Stent placement: Under fluoroscopy, a 14 mm x 60 mm stent was deployed from the iliac bifurcation back into the left external iliac vein completely covering the common iliac occlusion. Post insertion, repeat venogram performed. This confirms successful recanalization of the left common iliac venous occlusion with restoration of antegrade venous flow. No significant residual stenosis. No residual thrombus. Images obtained for documentation. Access removed. Hemostasis obtained with compression. FINDINGS: Imaging confirms lysis of the left lower extremity DVT with left common iliac venous occlusion treated successfully with a 14 x 60 mm self expanding stent. 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 This was successfully treated with a 14 mm x 60 mm self expanding stent insertion with restoration of antegrade flow. Electronically Signed   By: Judie Petit.  Shick M.D.   On: 02/01/2016 17:25   Ir Infusion Thrombol Venous Initial (ms)  01/31/2016  INDICATION: Symptomatic left lower extremity deep vein thrombosis. 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 Plan for catheter directed thrombolytic therapy. EXAM: LEFT LOWER EXTREMITY VENOGRAM; INITIATION OF CATHETER DIRECTED THROMBOLYSIS IN THE LEFT LOWER EXTREMITY AND PELVIS; ULTRASOUND GUIDANCE FOR VASCULAR ACCESS COMPARISON:  CT venogram 01/31/2016 MEDICATIONS: 25 mg Benadryl IV. The patient was pre-medicated with Benadryl and prednisone for Iohexol allergy. Patient tolerated the recent CT venogram without symptoms. ANESTHESIA/SEDATION: Versed 0.5 mg IV; Fentanyl 50 mcg IV Moderate Sedation Time:  11 minutes The patient was continuously monitored during the procedure by the interventional radiology nurse under my direct supervision. FLUOROSCOPY TIME:  Fluoroscopy Time: 4 minutes  36 seconds CONTRAST:  20 mL Omnipaque 300 COMPLICATIONS: None immediate. TECHNIQUE: Informed written consent was obtained from the patient after a thorough discussion of the procedural risks, benefits and alternatives. All questions were addressed. Maximal Sterile Barrier Technique was utilized including caps, mask, sterile gowns, sterile gloves, sterile drape, hand hygiene and skin antiseptic. A timeout was performed prior to the initiation of the procedure. Patient was placed prone. The left popliteal region was prepped and draped in a sterile  fashion. Skin was anesthetized with 1% lidocaine. 21 gauge needle directed into the occluded popliteal vein with ultrasound guidance. A micropuncture dilator set was placed. Small amount of contrast was injected to confirm placement in the vein. A Bentson wire was placed and a 6 [place] vascular sheath was placed. A 100 cm, 5 French catheter was easily advanced into the left common iliac vein. Small amount of contrast was injected to confirm thrombus throughout the left femoral vein and left iliac veins. Collateral vessels were also identified. Glidewire was used to cannulate the IVC and catheter was advanced into the lower IVC. IVC venogram was performed. The 5 French catheter was removed and a 90 cm (50 cm infusion length) catheter was advanced over the Bentson wire. Tip of the catheter was placed in the distal IVC. Distal aspect of the catheter is in the distal femoral vein. Catheter directed thrombolysis was started at 0.4 mg/hr of tenecteplase. The sheath was sutured to the skin. Bandage placed over the sheath and infusion catheter. FINDINGS: Thrombosis of the left popliteal vein by ultrasound. Left lower extremity venogram confirmed thrombus throughout the left femoral vein and left iliac veins. The IVC is patent. Infusion catheter placed in the left femoral and left iliac veins. IMPRESSION: Initiation of catheter directed thrombolysis in the left lower extremity and  pelvis. Plan for catheter directed thrombolysis overnight. Patient will return to Interventional Radiology on 02/01/2016 for follow-up venogram with possible balloon angioplasty and stent placement in the left common iliac vein. Electronically Signed   By: Richarda Overlie M.D.   On: 01/31/2016 17:33   Ir Rande Lawman F/u Eval Art/ven Final Day (ms)  02/01/2016  INDICATION: Acute symptomatic left lower extremity DVT, May-Thurner syndrome, 24 hours status post thrombolysis. EXAM: IR THROMB F/U EVAL ART/VEN SUBSEQ DAY (MS); LEFT EXTREMITY VENOGRAPHY; IR TRANSCATH PLC STENT 1ST ART NOT LE CV CAR VERT CAR COMPARISON:  02/01/2016, 01/31/2016 MEDICATIONS: 1% lidocaine locally ANESTHESIA/SEDATION: Fentanyl 150 mcg IV The patient was continuously monitored during the procedure by the interventional radiology nurse under my direct supervision. FLUOROSCOPY TIME:  Fluoroscopy Time: 4 minutes 30 seconds (70 mGy). COMPLICATIONS: None immediate. TECHNIQUE: Informed written consent was obtained from the patient after a thorough discussion of the procedural risks, benefits and alternatives. All questions were addressed. Maximal Sterile Barrier Technique was utilized including caps, mask, sterile gowns, sterile gloves, sterile drape, hand hygiene and skin antiseptic. A timeout was performed prior to the initiation of the procedure. Under sterile conditions, follow-up left lower extremity venogram performed through the existing access. This demonstrates complete lysis of the left iliofemoral DVT. No significant residual thrombus within the left lower extremity deep venous system. 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 Under sterile conditions and local anesthesia, the existing 6 French sheath was exchanged for a 7 French 50 cm sheath. The sheath was advanced into the left external iliac vein. Over an exchange guidewire,  left iliac venogram was repeated. This confirms persistent occlusion of the left common iliac vein at the bifurcation. Initially, 12 mm venous angioplasty performed of the left common iliac venous occlusion. A single inflation was performed to approximately 15 atmospheres for 30 seconds. Balloon was deflated removed. Central venogram repeated. This demonstrates recanalization of the left common iliac occlusion. There is diffuse wall irregularity noted. The bifurcation can be identified. Lower IVC remains patent. Measurements obtained for the appropriate stent length and diameter. Stent placement: Under fluoroscopy, a  14 mm x 60 mm stent was deployed from the iliac bifurcation back into the left external iliac vein completely covering the common iliac occlusion. Post insertion, repeat venogram performed. This confirms successful recanalization of the left common iliac venous occlusion with restoration of antegrade venous flow. No significant residual stenosis. No residual thrombus. Images obtained for documentation. Access removed. Hemostasis obtained with compression. FINDINGS: Imaging confirms lysis of the left lower extremity DVT with left common iliac venous occlusion treated successfully with a 14 x 60 mm self expanding stent. 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 This was successfully treated with a 14 mm x 60 mm self expanding stent insertion with restoration of antegrade flow. Electronically Signed   By: Judie Petit.  Shick M.D.   On: 02/01/2016 17:25   Micro Results      Recent Results (from the past 240 hour(s))  MRSA PCR Screening     Status: None   Collection Time: 01/31/16  4:59 PM  Result Value Ref Range Status   MRSA by PCR NEGATIVE NEGATIVE Final    Comment:        The GeneXpert MRSA Assay (FDA approved for NASAL specimens only), is one component of a comprehensive MRSA  colonization surveillance program. It is not intended to diagnose MRSA infection nor to guide or monitor treatment for MRSA infections.   Culture, blood (x 2)     Status: None (Preliminary result)   Collection Time: 02/06/16 12:00 AM  Result Value Ref Range Status   Specimen Description BLOOD LEFT HAND  Final   Special Requests IN PEDIATRIC BOTTLE 2CC  Final   Culture NO GROWTH 1 DAY  Final   Report Status PENDING  Incomplete  Culture, blood (x 2)     Status: None (Preliminary result)   Collection Time: 02/06/16 12:05 AM  Result Value Ref Range Status   Specimen Description BLOOD BLOOD LEFT HAND  Final   Special Requests IN PEDIATRIC BOTTLE 2CC  Final   Culture NO GROWTH 1 DAY  Final   Report Status PENDING  Incomplete       Today   Subjective    [name] today has no headache,no chest abdominal pain,no new weakness tingling or numbness, feels much better wants to go home today.     Objective   Blood pressure 124/69, pulse 74, temperature 98.3 F (36.8 C), temperature source Oral, resp. rate 20, height 5\' 5"  (1.651 m), weight 52.8 kg (116 lb 6.5 oz), last menstrual period 10/01/2010, SpO2 96 %.   Intake/Output Summary (Last 24 hours) at 02/08/16 1020 Last data filed at 02/08/16 0910  Gross per 24 hour  Intake   1010 ml  Output      0 ml  Net   1010 ml    Exam Awake Alert, Oriented x 3, No new F.N deficits, Normal affect [place].AT,PERRAL Supple Neck,No JVD, No cervical lymphadenopathy appriciated.  Symmetrical Chest wall movement, Good air movement bilaterally, CTAB RRR,No Gallops,Rubs or new Murmurs, No Parasternal Heave +ve B.Sounds, Abd Soft, Non tender, No organomegaly appriciated, No rebound -guarding or rigidity. No Cyanosis, Clubbing or edema, No new Rash or bruise   Data Review   CBC w Diff:  Lab Results  Component Value Date   WBC 10.7* 02/06/2016   HGB 9.0* 02/07/2016   HCT 28.6* 02/07/2016   PLT 117* 02/06/2016   LYMPHOPCT 9 01/31/2016    MONOPCT 1 01/31/2016  EOSPCT 0 01/31/2016   BASOPCT 0 01/31/2016    CMP:  Lab Results  Component Value Date   NA 143 02/07/2016   K 4.0 02/07/2016   CL 114* 02/07/2016   CO2 20* 02/07/2016   BUN <5* 02/07/2016   CREATININE 0.52 02/07/2016   PROT 4.3* 02/05/2016   ALBUMIN 1.5* 02/05/2016   BILITOT 0.5 02/05/2016   ALKPHOS 96 02/05/2016   AST 28 02/05/2016   ALT 23 02/05/2016  .   Total Time in preparing paper work, data evaluation and todays exam - No charge  Randol Kern, Hawkins Seaman M.D on 02/08/2016 at 10:20 AM  [facility]   Office  [phone]

## 2016-02-08 NOTE — Progress Notes (Signed)
Patient has had 4 loose, watery bowel movements since beginning of shift.  Per MAR, patient has miralax and colace scheduled; however, she has been refusing most doses.  Notified NP on-call.  Will continue to monitor.

## 2016-02-09 ENCOUNTER — Telehealth: Payer: Self-pay

## 2016-02-09 NOTE — Telephone Encounter (Signed)
[facility] Post-discharge Follow-Up Phone Call:  Date of Discharge: 02/08/16 Principal Discharge Diagnosis(es): DVT, anemia Post-discharge Communication: Attempt #1. Call placed to #[phone]; unable to reach patient. HIPPA compliant voicemail left requesting return call. Call completed: No

## 2016-02-10 ENCOUNTER — Telehealth: Payer: Self-pay

## 2016-02-10 NOTE — Telephone Encounter (Signed)
[facility] Post-discharge Follow-Up Phone Call:  Date of Discharge: 02/08/16 Principal Discharge Diagnosis(es): DVT, anemia Post-discharge Communication: Attempt #2 to reach patient and complete discharge phone call.  Call placed to #[phone]; unable to reach patient. HIPPA compliant voicemail left requesting return call. Call completed: No

## 2016-02-10 NOTE — Telephone Encounter (Signed)
[facility] Post-discharge Follow-Up Phone Call:  Date of Discharge: 02/08/16 Principal Discharge Diagnosis(es): DVT, anemia Post-discharge Communication: Attempt #3 to reach patient and complete post-discharge follow-up phone call. Call placed to #[phone]; unable to reach patient. Left an additional HIPPA compliant voicemail requesting return call.  Call Completed: No

## 2016-02-11 LAB — CULTURE, BLOOD (ROUTINE X 2)
Culture: NO GROWTH
Culture: NO GROWTH

## 2016-02-13 ENCOUNTER — Telehealth: Payer: Self-pay

## 2016-02-13 NOTE — Telephone Encounter (Signed)
[facility] Post-discharge Follow-Up Phone Call:  Date of Discharge:  02/08/2016 Principal Discharge Diagnosis(es):  DVT, DM2, HTN, sever protein calorie malnutrition Post-discharge Communication: (Clearly document all attempts clearly and date contact made) Call placed to [phone] (H) and a HIPAA compliant voice mail message was left requesting a call back to # [phone] or [phone].   Call Completed: No.  The patient did not want to provide another contact person /#  When this CM met with her in the hospital.

## 2016-02-14 ENCOUNTER — Ambulatory Visit: Payer: Medicare Other | Admitting: Family Medicine

## 2016-02-28 ENCOUNTER — Observation Stay (HOSPITAL_COMMUNITY): Payer: Medicare Other

## 2016-02-28 ENCOUNTER — Encounter (HOSPITAL_COMMUNITY): Payer: Self-pay | Admitting: Emergency Medicine

## 2016-02-28 ENCOUNTER — Inpatient Hospital Stay (HOSPITAL_COMMUNITY)
Admission: EM | Admit: 2016-02-28 | Discharge: 2016-03-06 | DRG: 871 | Disposition: A | Discharge status: Home Health | Payer: Medicare Other | Attending: Internal Medicine | Admitting: Internal Medicine

## 2016-02-28 ENCOUNTER — Emergency Department (HOSPITAL_COMMUNITY): Payer: Medicare Other

## 2016-02-28 DIAGNOSIS — D696 Thrombocytopenia, unspecified: Secondary | ICD-10-CM | POA: Diagnosis present

## 2016-02-28 DIAGNOSIS — D649 Anemia, unspecified: Secondary | ICD-10-CM | POA: Diagnosis present

## 2016-02-28 DIAGNOSIS — F17210 Nicotine dependence, cigarettes, uncomplicated: Secondary | ICD-10-CM | POA: Diagnosis present

## 2016-02-28 DIAGNOSIS — I10 Essential (primary) hypertension: Secondary | ICD-10-CM | POA: Diagnosis present

## 2016-02-28 DIAGNOSIS — N390 Urinary tract infection, site not specified: Secondary | ICD-10-CM | POA: Diagnosis not present

## 2016-02-28 DIAGNOSIS — E039 Hypothyroidism, unspecified: Secondary | ICD-10-CM | POA: Diagnosis present

## 2016-02-28 DIAGNOSIS — R1084 Generalized abdominal pain: Secondary | ICD-10-CM | POA: Diagnosis not present

## 2016-02-28 DIAGNOSIS — Z86718 Personal history of other venous thrombosis and embolism: Secondary | ICD-10-CM | POA: Diagnosis not present

## 2016-02-28 DIAGNOSIS — Z86711 Personal history of pulmonary embolism: Secondary | ICD-10-CM | POA: Diagnosis not present

## 2016-02-28 DIAGNOSIS — R1013 Epigastric pain: Secondary | ICD-10-CM | POA: Diagnosis not present

## 2016-02-28 DIAGNOSIS — I82402 Acute embolism and thrombosis of unspecified deep veins of left lower extremity: Secondary | ICD-10-CM | POA: Diagnosis not present

## 2016-02-28 DIAGNOSIS — R21 Rash and other nonspecific skin eruption: Secondary | ICD-10-CM | POA: Diagnosis present

## 2016-02-28 DIAGNOSIS — Z7901 Long term (current) use of anticoagulants: Secondary | ICD-10-CM

## 2016-02-28 DIAGNOSIS — K760 Fatty (change of) liver, not elsewhere classified: Secondary | ICD-10-CM | POA: Diagnosis present

## 2016-02-28 DIAGNOSIS — E785 Hyperlipidemia, unspecified: Secondary | ICD-10-CM | POA: Diagnosis present

## 2016-02-28 DIAGNOSIS — R0602 Shortness of breath: Secondary | ICD-10-CM | POA: Diagnosis not present

## 2016-02-28 DIAGNOSIS — K219 Gastro-esophageal reflux disease without esophagitis: Secondary | ICD-10-CM | POA: Diagnosis present

## 2016-02-28 DIAGNOSIS — K529 Noninfective gastroenteritis and colitis, unspecified: Secondary | ICD-10-CM | POA: Diagnosis present

## 2016-02-28 DIAGNOSIS — R5381 Other malaise: Secondary | ICD-10-CM

## 2016-02-28 DIAGNOSIS — E43 Unspecified severe protein-calorie malnutrition: Secondary | ICD-10-CM | POA: Diagnosis present

## 2016-02-28 DIAGNOSIS — Z681 Body mass index (BMI) 19 or less, adult: Secondary | ICD-10-CM | POA: Diagnosis not present

## 2016-02-28 DIAGNOSIS — R000 Tachycardia, unspecified: Secondary | ICD-10-CM | POA: Diagnosis not present

## 2016-02-28 DIAGNOSIS — E872 Acidosis, unspecified: Secondary | ICD-10-CM | POA: Diagnosis present

## 2016-02-28 DIAGNOSIS — R0789 Other chest pain: Secondary | ICD-10-CM | POA: Diagnosis not present

## 2016-02-28 DIAGNOSIS — I871 Compression of vein: Secondary | ICD-10-CM | POA: Diagnosis present

## 2016-02-28 DIAGNOSIS — R945 Abnormal results of liver function studies: Secondary | ICD-10-CM

## 2016-02-28 DIAGNOSIS — E119 Type 2 diabetes mellitus without complications: Secondary | ICD-10-CM | POA: Diagnosis present

## 2016-02-28 DIAGNOSIS — I950 Idiopathic hypotension: Secondary | ICD-10-CM | POA: Diagnosis not present

## 2016-02-28 DIAGNOSIS — H409 Unspecified glaucoma: Secondary | ICD-10-CM | POA: Diagnosis present

## 2016-02-28 DIAGNOSIS — R079 Chest pain, unspecified: Secondary | ICD-10-CM | POA: Diagnosis not present

## 2016-02-28 DIAGNOSIS — R109 Unspecified abdominal pain: Secondary | ICD-10-CM | POA: Insufficient documentation

## 2016-02-28 DIAGNOSIS — N179 Acute kidney failure, unspecified: Secondary | ICD-10-CM | POA: Diagnosis present

## 2016-02-28 DIAGNOSIS — A419 Sepsis, unspecified organism: Secondary | ICD-10-CM | POA: Diagnosis present

## 2016-02-28 DIAGNOSIS — E860 Dehydration: Secondary | ICD-10-CM

## 2016-02-28 DIAGNOSIS — R7989 Other specified abnormal findings of blood chemistry: Secondary | ICD-10-CM

## 2016-02-28 DIAGNOSIS — J45909 Unspecified asthma, uncomplicated: Secondary | ICD-10-CM | POA: Diagnosis present

## 2016-02-28 DIAGNOSIS — Z79899 Other long term (current) drug therapy: Secondary | ICD-10-CM | POA: Diagnosis not present

## 2016-02-28 DIAGNOSIS — R740 Nonspecific elevation of levels of transaminase and lactic acid dehydrogenase [LDH]: Secondary | ICD-10-CM | POA: Diagnosis not present

## 2016-02-28 DIAGNOSIS — E118 Type 2 diabetes mellitus with unspecified complications: Secondary | ICD-10-CM

## 2016-02-28 DIAGNOSIS — E878 Other disorders of electrolyte and fluid balance, not elsewhere classified: Secondary | ICD-10-CM | POA: Diagnosis present

## 2016-02-28 DIAGNOSIS — G8929 Other chronic pain: Secondary | ICD-10-CM | POA: Diagnosis not present

## 2016-02-28 DIAGNOSIS — G629 Polyneuropathy, unspecified: Secondary | ICD-10-CM | POA: Diagnosis present

## 2016-02-28 DIAGNOSIS — E876 Hypokalemia: Secondary | ICD-10-CM | POA: Diagnosis present

## 2016-02-28 LAB — CBC WITH DIFFERENTIAL/PLATELET
BASOS ABS: 0.1 10*3/uL (ref 0.0–0.1)
BASOS ABS: 0 10*3/uL (ref 0.0–0.1)
BASOS PCT: 0 %
Basophils Relative: 0 %
EOS ABS: 0.1 10*3/uL (ref 0.0–0.7)
EOS PCT: 1 %
Eosinophils Absolute: 0 10*3/uL (ref 0.0–0.7)
Eosinophils Relative: 0 %
HEMATOCRIT: 35.8 % — AB (ref 36.0–46.0)
HEMATOCRIT: 29.1 % — AB (ref 36.0–46.0)
HEMOGLOBIN: 11.6 g/dL — AB (ref 12.0–15.0)
Hemoglobin: 9.3 g/dL — ABNORMAL LOW (ref 12.0–15.0)
LYMPHS PCT: 10 %
Lymphocytes Relative: 22 %
Lymphs Abs: 1.6 10*3/uL (ref 0.7–4.0)
Lymphs Abs: 2.2 10*3/uL (ref 0.7–4.0)
MCH: 31.3 pg (ref 26.0–34.0)
MCH: 31.1 pg (ref 26.0–34.0)
MCHC: 32.4 g/dL (ref 30.0–36.0)
MCHC: 32 g/dL (ref 30.0–36.0)
MCV: 96.5 fL (ref 78.0–100.0)
MCV: 97.3 fL (ref 78.0–100.0)
MONO ABS: 0.6 10*3/uL (ref 0.1–1.0)
MONOS PCT: 6 %
Monocytes Absolute: 0.7 10*3/uL (ref 0.1–1.0)
Monocytes Relative: 4 %
NEUTROS ABS: 13.3 10*3/uL — AB (ref 1.7–7.7)
NEUTROS ABS: 7.2 10*3/uL (ref 1.7–7.7)
NEUTROS PCT: 86 %
Neutrophils Relative %: 71 %
PLATELETS: 128 10*3/uL — AB (ref 150–400)
Platelets: 172 10*3/uL (ref 150–400)
RBC: 3.71 MIL/uL — AB (ref 3.87–5.11)
RBC: 2.99 MIL/uL — ABNORMAL LOW (ref 3.87–5.11)
RDW: 18.3 % — ABNORMAL HIGH (ref 11.5–15.5)
RDW: 18 % — AB (ref 11.5–15.5)
WBC: 15.7 10*3/uL — AB (ref 4.0–10.5)
WBC: 10.1 10*3/uL (ref 4.0–10.5)

## 2016-02-28 LAB — URINALYSIS, ROUTINE W REFLEX MICROSCOPIC
Glucose, UA: NEGATIVE mg/dL
Ketones, ur: 15 mg/dL — AB
NITRITE: POSITIVE — AB
PROTEIN: 100 mg/dL — AB
SPECIFIC GRAVITY, URINE: 1.026 (ref 1.005–1.030)
pH: 5.5 (ref 5.0–8.0)

## 2016-02-28 LAB — COMPREHENSIVE METABOLIC PANEL
ALBUMIN: 2.4 g/dL — AB (ref 3.5–5.0)
ALBUMIN: 1.8 g/dL — AB (ref 3.5–5.0)
ALK PHOS: 155 U/L — AB (ref 38–126)
ALT: 45 U/L (ref 14–54)
ALT: 36 U/L (ref 14–54)
ANION GAP: 23 — AB (ref 5–15)
ANION GAP: 15 (ref 5–15)
AST: 139 U/L — AB (ref 15–41)
AST: 114 U/L — ABNORMAL HIGH (ref 15–41)
Alkaline Phosphatase: 192 U/L — ABNORMAL HIGH (ref 38–126)
BILIRUBIN TOTAL: 0.9 mg/dL (ref 0.3–1.2)
BILIRUBIN TOTAL: 0.7 mg/dL (ref 0.3–1.2)
BUN: <5 mg/dL — ABNORMAL LOW (ref 6–20)
BUN: <5 mg/dL — ABNORMAL LOW (ref 6–20)
CALCIUM: 6.8 mg/dL — AB (ref 8.9–10.3)
CHLORIDE: 95 mmol/L — AB (ref 101–111)
CO2: 21 mmol/L — ABNORMAL LOW (ref 22–32)
CO2: 19 mmol/L — ABNORMAL LOW (ref 22–32)
CREATININE: 1.01 mg/dL — AB (ref 0.44–1.00)
Calcium: 8 mg/dL — ABNORMAL LOW (ref 8.9–10.3)
Chloride: 104 mmol/L (ref 101–111)
Creatinine, Ser: 1.04 mg/dL — ABNORMAL HIGH (ref 0.44–1.00)
GFR calc non Af Amer: >60 mL/min (ref >60)
GFR, EST NON AFRICAN AMERICAN: 58 mL/min — AB (ref >60)
GLUCOSE: 98 mg/dL (ref 65–99)
Glucose, Bld: 105 mg/dL — ABNORMAL HIGH (ref 65–99)
POTASSIUM: 3.6 mmol/L (ref 3.5–5.1)
Potassium: 3.4 mmol/L — ABNORMAL LOW (ref 3.5–5.1)
Sodium: 139 mmol/L (ref 135–145)
Sodium: 138 mmol/L (ref 135–145)
TOTAL PROTEIN: 5.9 g/dL — AB (ref 6.5–8.1)
TOTAL PROTEIN: 4.5 g/dL — AB (ref 6.5–8.1)

## 2016-02-28 LAB — URINE MICROSCOPIC-ADD ON

## 2016-02-28 LAB — LACTIC ACID, PLASMA: LACTIC ACID, VENOUS: 9.8 mmol/L — AB (ref 0.5–2.0)

## 2016-02-28 LAB — TYPE AND SCREEN
ABO/RH(D): B POS
Antibody Screen: NEGATIVE

## 2016-02-28 LAB — TROPONIN I

## 2016-02-28 LAB — GLUCOSE, CAPILLARY: GLUCOSE-CAPILLARY: 129 mg/dL — AB (ref 65–99)

## 2016-02-28 LAB — POC OCCULT BLOOD, ED: FECAL OCCULT BLD: NEGATIVE

## 2016-02-28 LAB — I-STAT CG4 LACTIC ACID, ED: Lactic Acid, Venous: 12.71 mmol/L (ref 0.5–2.0)

## 2016-02-28 MED ORDER — LEVOFLOXACIN IN D5W 750 MG/150ML IV SOLN: 750.0000 mg | INTRAVENOUS | Status: DC

## 2016-02-28 MED ORDER — SODIUM CHLORIDE 0.9 % IV BOLUS (SEPSIS)
1000.0000 mL | Freq: Once | INTRAVENOUS | Status: AC
  Administered 2016-02-28: 1000 mL via INTRAVENOUS

## 2016-02-28 MED ORDER — INSULIN ASPART 100 UNIT/ML ~~LOC~~ SOLN
0.0000 [IU] | Freq: Three times a day (TID) | SUBCUTANEOUS | Status: DC
  Administered 2016-03-03: 2 [IU] via SUBCUTANEOUS
  Administered 2016-03-03: 5 [IU] via SUBCUTANEOUS
  Administered 2016-03-03: 3 [IU] via SUBCUTANEOUS
  Administered 2016-03-04: 1 [IU] via SUBCUTANEOUS
  Administered 2016-03-05: 2 [IU] via SUBCUTANEOUS

## 2016-02-28 MED ORDER — POLYETHYLENE GLYCOL 3350 17 G PO PACK
17.0000 g | PACK | Freq: Every day | ORAL | Status: DC
Start: 2016-02-28 — End: 2016-02-28

## 2016-02-28 MED ORDER — FOLIC ACID 1 MG PO TABS
1.0000 mg | ORAL_TABLET | Freq: Every day | ORAL | Status: DC
  Administered 2016-02-28 – 2016-03-06 (×8): 1 mg via ORAL
  Filled 2016-02-28 (×8): qty 1

## 2016-02-28 MED ORDER — INSULIN ASPART 100 UNIT/ML ~~LOC~~ SOLN: 0.0000 [IU] | Freq: Every day | SUBCUTANEOUS | Status: DC

## 2016-02-28 MED ORDER — ACETAMINOPHEN 325 MG PO TABS
650.0000 mg | ORAL_TABLET | Freq: Four times a day (QID) | ORAL | Status: DC | PRN
Start: 2016-02-28 — End: 2016-03-05
  Administered 2016-03-03: 650 mg via ORAL
  Filled 2016-02-28: qty 2

## 2016-02-28 MED ORDER — ONDANSETRON HCL 4 MG PO TABS
4.0000 mg | ORAL_TABLET | Freq: Three times a day (TID) | ORAL | Status: DC | PRN
  Administered 2016-02-28 – 2016-02-29 (×2): 4 mg via ORAL
  Filled 2016-02-28 (×2): qty 1

## 2016-02-28 MED ORDER — SODIUM CHLORIDE 0.9% FLUSH
3.0000 mL | Freq: Two times a day (BID) | INTRAVENOUS | Status: DC
  Administered 2016-02-28 – 2016-03-06 (×11): 3 mL via INTRAVENOUS

## 2016-02-28 MED ORDER — ACETAMINOPHEN 650 MG RE SUPP: 650.0000 mg | Freq: Four times a day (QID) | RECTAL | Status: DC | PRN

## 2016-02-28 MED ORDER — GABAPENTIN 100 MG PO CAPS
100.0000 mg | ORAL_CAPSULE | Freq: Two times a day (BID) | ORAL | Status: DC
  Administered 2016-02-28 – 2016-03-06 (×14): 100 mg via ORAL
  Filled 2016-02-28 (×14): qty 1

## 2016-02-28 MED ORDER — HYDROCODONE-ACETAMINOPHEN 5-325 MG PO TABS
1.0000 | ORAL_TABLET | Freq: Four times a day (QID) | ORAL | Status: DC | PRN
  Administered 2016-02-28 – 2016-02-29 (×3): 2 via ORAL
  Filled 2016-02-28 (×3): qty 2

## 2016-02-28 MED ORDER — APIXABAN 5 MG PO TABS
10.0000 mg | ORAL_TABLET | Freq: Two times a day (BID) | ORAL | Status: AC
Start: 2016-02-28 — End: 2016-03-01
  Administered 2016-02-28 – 2016-03-01 (×5): 10 mg via ORAL
  Filled 2016-02-28 (×5): qty 2

## 2016-02-28 MED ORDER — AZTREONAM 2 G IJ SOLR
2.0000 g | Freq: Once | INTRAMUSCULAR | Status: DC
Start: 2016-02-28 — End: 2016-02-28

## 2016-02-28 MED ORDER — OXYCODONE HCL 5 MG PO TABS
5.0000 mg | ORAL_TABLET | Freq: Once | ORAL | Status: AC
  Administered 2016-02-28: 5 mg via ORAL
  Filled 2016-02-28: qty 1

## 2016-02-28 MED ORDER — SODIUM CHLORIDE 0.9 % IV BOLUS (SEPSIS)
500.0000 mL | Freq: Once | INTRAVENOUS | Status: AC
  Administered 2016-02-28: 500 mL via INTRAVENOUS

## 2016-02-28 MED ORDER — ROSUVASTATIN CALCIUM 20 MG PO TABS
20.0000 mg | ORAL_TABLET | Freq: Every day | ORAL | Status: DC
  Administered 2016-02-28: 20 mg via ORAL
  Filled 2016-02-28 (×5): qty 1

## 2016-02-28 MED ORDER — LEVOFLOXACIN IN D5W 500 MG/100ML IV SOLN
500.0000 mg | INTRAVENOUS | Status: DC
  Administered 2016-02-28: 500 mg via INTRAVENOUS

## 2016-02-28 MED ORDER — PANTOPRAZOLE SODIUM 40 MG PO TBEC
40.0000 mg | DELAYED_RELEASE_TABLET | Freq: Every day | ORAL | Status: DC
  Administered 2016-02-28 – 2016-03-01 (×3): 40 mg via ORAL
  Filled 2016-02-28 (×5): qty 1

## 2016-02-28 MED ORDER — LEVOFLOXACIN IN D5W 750 MG/150ML IV SOLN
750.0000 mg | Freq: Once | INTRAVENOUS | Status: AC
  Administered 2016-02-28: 750 mg via INTRAVENOUS
  Filled 2016-02-28: qty 150

## 2016-02-28 MED ORDER — BOOST / RESOURCE BREEZE PO LIQD
1.0000 | Freq: Three times a day (TID) | ORAL | Status: DC
  Administered 2016-02-29 – 2016-03-06 (×17): 1 via ORAL

## 2016-02-28 MED ORDER — LEVOTHYROXINE SODIUM 25 MCG PO TABS
25.0000 ug | ORAL_TABLET | Freq: Every day | ORAL | Status: DC
Start: 2016-02-29 — End: 2016-03-06
  Administered 2016-02-29 – 2016-03-06 (×7): 25 ug via ORAL
  Filled 2016-02-28 (×7): qty 1

## 2016-02-28 MED ORDER — TRAZODONE HCL 50 MG PO TABS
25.0000 mg | ORAL_TABLET | Freq: Every evening | ORAL | Status: DC | PRN
  Administered 2016-02-28 – 2016-03-05 (×6): 25 mg via ORAL
  Filled 2016-02-28 (×7): qty 1

## 2016-02-28 MED ORDER — TECHNETIUM TO 99M ALBUMIN AGGREGATED
4.0000 | Freq: Once | INTRAVENOUS | Status: AC | PRN
  Administered 2016-02-28: 4 via INTRAVENOUS

## 2016-02-28 MED ORDER — CALCIUM CARBONATE 1250 (500 CA) MG PO TABS
1.0000 | ORAL_TABLET | Freq: Every day | ORAL | Status: DC
  Administered 2016-02-29 – 2016-03-02 (×3): 500 mg via ORAL
  Filled 2016-02-28 (×3): qty 1

## 2016-02-28 MED ORDER — SODIUM CHLORIDE 0.9 % IV SOLN
INTRAVENOUS | Status: DC
  Administered 2016-02-28 – 2016-03-01 (×3): via INTRAVENOUS

## 2016-02-28 MED ORDER — TECHNETIUM TC 99M DIETHYLENETRIAME-PENTAACETIC ACID: 32.0000 | Freq: Once | INTRAVENOUS | Status: DC | PRN

## 2016-02-28 MED ORDER — FLUCONAZOLE 100 MG PO TABS
50.0000 mg | ORAL_TABLET | Freq: Every day | ORAL | Status: DC
  Administered 2016-02-28 – 2016-03-03 (×5): 50 mg via ORAL
  Filled 2016-02-28 (×5): qty 1

## 2016-02-28 MED ORDER — DOCUSATE SODIUM 100 MG PO CAPS: 200.0000 mg | ORAL_CAPSULE | Freq: Two times a day (BID) | ORAL | Status: DC

## 2016-02-28 MED ORDER — SACCHAROMYCES BOULARDII 250 MG PO CAPS
250.0000 mg | ORAL_CAPSULE | Freq: Two times a day (BID) | ORAL | Status: DC
  Administered 2016-02-28 – 2016-03-06 (×14): 250 mg via ORAL
  Filled 2016-02-28 (×14): qty 1

## 2016-02-28 MED ORDER — VITAMIN B-1 100 MG PO TABS
100.0000 mg | ORAL_TABLET | Freq: Every day | ORAL | Status: DC
  Administered 2016-02-28 – 2016-03-06 (×6): 100 mg via ORAL
  Filled 2016-02-28 (×9): qty 1

## 2016-02-28 MED ORDER — VENLAFAXINE HCL 25 MG PO TABS
25.0000 mg | ORAL_TABLET | Freq: Two times a day (BID) | ORAL | Status: DC
  Administered 2016-02-28 – 2016-03-06 (×14): 25 mg via ORAL
  Filled 2016-02-28 (×16): qty 1

## 2016-02-28 NOTE — Progress Notes (Addendum)
Pt HR sustaining in the 120s-130s,   Also, lactic acid came back at 12.71, NP notified about both.  1 L NS bolus x2 ordered for now.

## 2016-02-28 NOTE — ED Provider Notes (Signed)
CSN: [id number]     Arrival date & time 02/28/16  1145 History   First MD Initiated Contact with Patient 02/28/16 1209     Chief Complaint  Patient presents with  . Weakness     (Consider location/radiation/quality/duration/timing/severity/associated sxs/prior Treatment) The history is provided by the patient and medical records. No language interpreter was used.     [name] is a 55 y.o. female  with a hx of Non-insulin-dependent diabetes, glaucoma, hypertension, Crohn disease, asthma, pulmonary embolism, C. difficile, asthma presents to the Emergency Department from her PCP with pallor and tachycardia along with associated weakness onset approx 2 weeks. She presented to her PCP this morning for follow-up but was found to be unstable and transferred to the emergency department via EMS. She reports in the last several weeks she's had increasing fatigue, shortness of breath, left-sided chest pain, palpitations and near syncope.  She denies fever, chills, nausea, vomiting, diarrhea, melena, hematochezia, cough.    Record review shows that patient was admitted on 01/30/2016 for DVT. She was given heparin and then switched to Eliquis while in the hospital.  During her stay she had an acute anemia with a hemoglobin of 6.8 and required transfusion.  She was discharged on 02/08/2016 and went to 15 days without any blood thinners. She reports she resumed her Eliquis 5 days ago.   Patient also complains of rash to the groin area.  She reports it is irritated but is not using. She reports this began when she switched from diapers to pull-ups.    Past Medical History  Diagnosis Date  . Glaucoma   . Diabetes mellitus without complication (HCC)   . Hypertension   . Neuropathy (HCC)   . Crohn disease (HCC)   . Asthma   . Pulmonary embolus (HCC) 05/24/2014  . Hypothyroidism   . GERD (gastroesophageal reflux disease)   . HLD (hyperlipidemia)   . C. difficile colitis   . Pancreatitis   . Asthma    . Tobacco abuse    Past Surgical History  Procedure Laterality Date  . Cesarean section    . Esophagogastroduodenoscopy N/A 05/21/2014    Procedure: ESOPHAGOGASTRODUODENOSCOPY (EGD);  Surgeon: Petra Kuba, MD;  Location: [facility] ENDOSCOPY;  Service: Endoscopy;  Laterality: N/A;  . Esophagogastroduodenoscopy N/A 03/02/2015    Procedure: ESOPHAGOGASTRODUODENOSCOPY (EGD);  Surgeon: Bernette Redbird, MD;  Location: [facility] ENDOSCOPY;  Service: Endoscopy;  Laterality: N/A;  . Colonoscopy N/A 03/02/2015    Procedure: COLONOSCOPY;  Surgeon: Bernette Redbird, MD;  Location: [facility] ENDOSCOPY;  Service: Endoscopy;  Laterality: N/A;   Family History  Problem Relation Age of Onset  . Breast cancer Mother   . Heart disease Mother 8    CABG  . Diabetes Mother   . Heart disease Father 61    CABG  . Diabetes Father    Social History  Substance Use Topics  . Smoking status: Current Every Day Smoker -- 0.25 packs/day for 26 years    Types: Cigarettes  . Smokeless tobacco: Never Used  . Alcohol Use: No   OB History    No data available     Review of Systems  Constitutional: Positive for fatigue. Negative for fever, diaphoresis, appetite change and unexpected weight change.  HENT: Negative for mouth sores.   Eyes: Negative for visual disturbance.  Respiratory: Positive for shortness of breath. Negative for cough, chest tightness and wheezing.   Cardiovascular: Positive for chest pain and palpitations. Negative for leg swelling.  Gastrointestinal: Negative for nausea,  vomiting, abdominal pain, diarrhea and constipation.  Endocrine: Negative for polydipsia, polyphagia and polyuria.  Genitourinary: Negative for dysuria, urgency, frequency and hematuria.  Musculoskeletal: Negative for back pain and neck stiffness.  Skin: Positive for rash.  Allergic/Immunologic: Negative for immunocompromised state.  Neurological: Positive for weakness. Negative for syncope, light-headedness and headaches.  Hematological: Does  not bruise/bleed easily.  Psychiatric/Behavioral: Negative for sleep disturbance. The patient is not nervous/anxious.       Allergies  Iohexol; Spiriva handihaler; Ativan; and Penicillins  Home Medications   Prior to Admission medications   Medication Sig Start Date End Date Taking? Authorizing Provider  apixaban (ELIQUIS) 5 MG TABS tablet Take 2 tablets (10 mg total) by mouth 2 (two) times daily. 02/07/16   Leroy Sea, MD  apixaban (ELIQUIS) 5 MG TABS tablet Take 1 tablet (5 mg total) by mouth 2 (two) times daily. 02/11/16   Leroy Sea, MD  calcium carbonate (OS-CAL - DOSED IN MG OF ELEMENTAL CALCIUM) 1250 (500 CA) MG tablet Take 1 tablet (500 mg of elemental calcium total) by mouth daily with breakfast. 03/04/15   Alison Murray, MD  cefpodoxime (VANTIN) 200 MG tablet Take 1 tablet (200 mg total) by mouth 2 (two) times daily. 02/07/16   Leroy Sea, MD  docusate sodium (COLACE) 100 MG capsule Take 2 capsules (200 mg total) by mouth 2 (two) times daily. 02/07/16   Leroy Sea, MD  feeding supplement, RESOURCE BREEZE, (RESOURCE BREEZE) LIQD Take 1 Container by mouth 3 (three) times daily between meals. 08/02/14   Kathlen Mody, MD  folic acid (FOLVITE) 1 MG tablet Take 1 tablet (1 mg total) by mouth daily. 08/02/14   Kathlen Mody, MD  gabapentin (NEURONTIN) 100 MG capsule Take 1 capsule (100 mg total) by mouth 2 (two) times daily. 04/05/15   Vassie Loll, MD  HYDROcodone-acetaminophen (NORCO/VICODIN) 5-325 MG tablet Take 1 tablet by mouth every 6 (six) hours as needed. 01/09/16   Derwood Kaplan, MD  HYDROmorphone (DILAUDID) 4 MG tablet Take 8 mg by mouth every 4 (four) hours as needed for severe pain.    Historical Provider, MD  lactulose (CHRONULAC) 10 GM/15ML solution Take 15 mLs (10 g total) by mouth daily as needed for moderate constipation. Patient not taking: Reported on 01/09/2016 04/05/15   Vassie Loll, MD  levothyroxine (SYNTHROID, LEVOTHROID) 25 MCG tablet Take 1 tablet (25  mcg total) by mouth daily before breakfast. 08/02/14   Kathlen Mody, MD  loperamide (IMODIUM) 2 MG capsule Take 1 capsule (2 mg total) by mouth 2 (two) times daily as needed for diarrhea or loose stools. 02/08/16   Leana Roe Elgergawy, MD  meclizine (ANTIVERT) 25 MG tablet Take 25 mg by mouth 3 (three) times daily as needed for dizziness.    Historical Provider, MD  metoprolol tartrate (LOPRESSOR) 25 MG tablet Take 0.5 tablets (12.5 mg total) by mouth 2 (two) times daily. Patient not taking: Reported on 01/09/2016 04/05/15   Vassie Loll, MD  ondansetron (ZOFRAN ODT) 4 MG disintegrating tablet Take 1 tablet (4 mg total) by mouth every 8 (eight) hours as needed for nausea or vomiting. 01/09/16   Derwood Kaplan, MD  ondansetron (ZOFRAN) 4 MG tablet Take 4 mg by mouth every 8 (eight) hours as needed for nausea or vomiting.    Historical Provider, MD  oxyCODONE-acetaminophen (PERCOCET/ROXICET) 5-325 MG tablet Take 1 tablet by mouth every 6 (six) hours as needed for moderate pain. 02/08/16   Starleen Arms, MD  pantoprazole (PROTONIX)  20 MG tablet Take 40 mg by mouth daily.    Historical Provider, MD  polyethylene glycol (MIRALAX / GLYCOLAX) packet Take 17 g by mouth daily. 02/07/16   Leroy Sea, MD  rosuvastatin (CRESTOR) 20 MG tablet Take 1 tablet (20 mg total) by mouth daily. 08/02/14   Kathlen Mody, MD  saccharomyces boulardii (FLORASTOR) 250 MG capsule Take 1 capsule (250 mg total) by mouth 2 (two) times daily. 04/05/15   Vassie Loll, MD  thiamine (VITAMIN B-1) 100 MG tablet Take 1 tablet (100 mg total) by mouth daily. 08/02/14   Kathlen Mody, MD  traZODone (DESYREL) 25 mg TABS tablet Take 0.5 tablets (25 mg total) by mouth at bedtime as needed for sleep. 08/02/14   Kathlen Mody, MD  venlafaxine (EFFEXOR) 25 MG tablet Take 1 tablet (25 mg total) by mouth 2 (two) times daily. 03/04/15   Alison Murray, MD   BP 104/82 mmHg  Pulse 103  Temp(Src) 99 F (37.2 C) (Oral)  Resp 21  Ht 5\' 5"  (1.651 m)  Wt  52.8 kg  BMI 19.37 kg/m2  SpO2 100%  LMP 10/01/2010 Physical Exam  Constitutional: She appears well-developed. No distress.  Awake, alert, chronically ill-appearing, pale  HENT:  Head: Normocephalic and atraumatic.  Mouth/Throat: Oropharynx is clear and moist. No oropharyngeal exudate.  Eyes: No scleral icterus.  Pale conjunctiva  Neck: Normal range of motion. Neck supple.  Cardiovascular: Regular rhythm, normal heart sounds and intact distal pulses.  Tachycardia present.   No murmur heard. Pulses:      Radial pulses are 2+ on the right side, and 2+ on the left side.       Dorsalis pedis pulses are 2+ on the right side, and 2+ on the left side.  Pulmonary/Chest: Effort normal and breath sounds normal. Tachypnea noted. No respiratory distress. She has no wheezes.  Equal chest expansion Clear but diminished breath sounds throughout She does become tachpneic when talking in long sentences  Abdominal: Soft. Bowel sounds are normal. She exhibits no mass. There is no tenderness. There is no rebound and no guarding.  Genitourinary:  Skin breakdown in the bilateral groin without erythema, induration or increased warmth to suggest secondary infection  Musculoskeletal: Normal range of motion. She exhibits no edema.  No peripheral edema  Lymphadenopathy:    She has no cervical adenopathy.  Neurological: She is alert.  Speech is clear and goal oriented Moves extremities without ataxia  Skin: Skin is warm and dry. She is not diaphoretic. There is pallor.  Psychiatric: She has a normal mood and affect.  Nursing note and vitals reviewed.   ED Course  Procedures (including critical care time) Labs Review Labs Reviewed  CBC WITH DIFFERENTIAL/PLATELET - Abnormal; Notable for the following:    WBC 15.7 (*)    RBC 3.71 (*)    Hemoglobin 11.6 (*)    HCT 35.8 (*)    RDW 18.3 (*)    Neutro Abs 13.3 (*)    All other components within normal limits  COMPREHENSIVE METABOLIC PANEL - Abnormal;  Notable for the following:    Chloride 95 (*)    CO2 21 (*)    Glucose, Bld 105 (*)    BUN <5 (*)    Creatinine, Ser 1.04 (*)    Calcium 8.0 (*)    Total Protein 5.9 (*)    Albumin 2.4 (*)    AST 139 (*)    Alkaline Phosphatase 192 (*)    GFR calc non Af  Amer 58 (*)    Anion gap 23 (*)    All other components within normal limits  URINALYSIS, ROUTINE W REFLEX MICROSCOPIC (NOT AT [facility]) - Abnormal; Notable for the following:    Color, Urine AMBER (*)    APPearance TURBID (*)    Hgb urine dipstick MODERATE (*)    Bilirubin Urine SMALL (*)    Ketones, ur 15 (*)    Protein, ur 100 (*)    Nitrite POSITIVE (*)    Leukocytes, UA LARGE (*)    All other components within normal limits  URINE MICROSCOPIC-ADD ON - Abnormal; Notable for the following:    Squamous Epithelial / LPF 6-30 (*)    Bacteria, UA MANY (*)    All other components within normal limits  CULTURE, BLOOD (ROUTINE X 2)  CULTURE, BLOOD (ROUTINE X 2)  URINE CULTURE  TROPONIN I  POC OCCULT BLOOD, ED  I-STAT CG4 LACTIC ACID, ED  TYPE AND SCREEN    Imaging Review Dg Chest Port 1 View  02/28/2016  CLINICAL DATA:  55 year old with 2 week history of mid to left-sided chest pain, shortness of breath and tachycardia. Current smoker. EXAM: PORTABLE CHEST 1 VIEW COMPARISON:  02/05/2016, 01/29/2016 and earlier, including CTA chest 03/31/2015. FINDINGS: Cardiac silhouette normal in size, unchanged. Thoracic aorta mildly tortuous and atherosclerotic, unchanged. Hilar and mediastinal contours otherwise unremarkable. Lungs clear. Bronchovascular markings normal. Pulmonary vascularity normal. No visible pleural effusions. No pneumothorax. IMPRESSION: No acute cardiopulmonary disease. Electronically Signed   By: Hulan Saas M.D.   On: 02/28/2016 15:08   I have personally reviewed and evaluated these images and lab results as part of my medical decision-making.   EKG Interpretation   Date/Time:  Tuesday Feb 28 2016 12:18:00  EDT Ventricular Rate:  123 PR Interval:  120 QRS Duration: 52 QT Interval:  342 QTC Calculation: 489 R Axis:   44 Text Interpretation:  Sinus tachycardia Multiform ventricular premature  complexes Abnormal R-wave progression, early transition Borderline T  abnormalities, anterior leads Borderline prolonged QT interval rate is  faster compared to May 7 Confirmed by GOLDSTON MD, SCOTT [phone]) on  02/28/2016 12:27:01 PM      MDM   Final diagnoses:  AKI (acute kidney injury) (HCC)  Tachycardia  UTI (lower urinary tract infection)   Ellouise Newer presents with weakness and tachycardia.  She is afebrile.  She is complaining of chest pain and shortness of breath. Labs show significant UTI though she denies UTI symptoms. Leukocytosis noted along with acute kidney injury with creatinine of 1.04. Of note patient with large anion gap, elevation in AST and alkaline phosphatase.  Hypochloremic.  Patient's anemia has improved significantly from her last visit now at 11.6. She's fecal occult negative and her troponin is negative. Chest x-ray shows no acute cardiopulmonary disease including no widening of the mediastinum and no pneumonia. Concern for possible PE as patient was off her out with for 15 days.  She reports allergy to CT contrast and will need VQ scan.  Lactic acid pending.  Patient has been given some fluids. She remains without fever or hypotension.  Discussed with Junious Silk, NP who will admit to stepdown.  The patient was discussed with and seen by Dr. Criss Alvine who agrees with the treatment plan.   Dahlia Client Maelin Kurkowski, PA-C 02/28/16 1519  Pricilla Loveless, MD 02/29/16 [phone]

## 2016-02-28 NOTE — ED Notes (Signed)
Pt coming from [facility] for f/u for dvt, found to be pale and having increased heart rate (124) c/o of some nausea given 4 zofran per ems pt has 24 left hand

## 2016-02-28 NOTE — Progress Notes (Signed)
02/28/16 2000 Patient refusing labs at this times.Pt refused and told them to come back later. Lab came back and pt still refuses. Pt states "they can come back in and hour and a half." Pt educated on importance of monitoring and labs draws. Will attempt to draw labs in a couple of hours.

## 2016-02-28 NOTE — ED Notes (Signed)
IV team at the bedside. 

## 2016-02-28 NOTE — Progress Notes (Signed)
Pharmacy Antibiotic Note  [name] is a 55 y.o. female admitted on 02/28/2016 with UTI.  Pharmacy has been consulted for levofloxacin dosing.  WBC 15.7, Tmax 53F. Cr 1.04, CrCl ~49 ml/min.  Plan: Levofloxacin 750 mg IV q24h Monitor renal function F/u cx, s/sx clinical improvement  Height:  (165.1 cm) Weight: 116 lb 6.5 oz (52.8 kg) IBW/kg (Calculated) : 57  Temp (24hrs), Avg:99 F (37.2 C), Min:99 F (37.2 C), Max:99 F (37.2 C)   Recent Labs Lab 02/28/16 1315  WBC 15.7*  CREATININE 1.04*    Estimated Creatinine Clearance: 49.7 mL/min (by C-G formula based on Cr of 1.04).    Allergies  Allergen Reactions  . Iohexol Other (See Comments)    "severe burning" Patient has received Contrast in 2005 with 13 hour pre-medication, and had no reaction at that time  . Spiriva Handihaler [Tiotropium Bromide Monohydrate] Nausea And Vomiting  . Ativan [Lorazepam] Other (See Comments)    hallucinations  . Penicillins Hives    Has had cephalosporins    Antimicrobials this admission: 5/30 levofloxacin >>   Dose adjustments this admission: n/a  Microbiology results: 5/30 BCx:  5/30 UCx:   Thank you for allowing pharmacy to be a part of this patient's care.  Floria Raveling 02/28/2016 3:07 PM

## 2016-02-28 NOTE — ED Notes (Signed)
Pt to NM

## 2016-02-28 NOTE — H&P (Signed)
History and Physical    DEMA TIMMONS WUJ:[id number] DOB: [birth year] DOA: 02/28/2016   PCP: Georgann Housekeeper, MD   Patient coming from/Resides with: Private residence/lives with her 57 year old son and also does have a caretaker named Optician, dispensing Complaint: Weakness and tachycardia  HPI: [name] is a 55 y.o. 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 with recent hospitalization earlier this month for extensive left lower extremity DVT status post trauma lysis and stent assisted angioplasty in interventional radiology, known chronic diarrhea and recurrent dehydration, history of reported Crohn's colitis and prior GI bleeding, history of remote PE and had been off anticoagulation for 1 year until most recent admission for DVT, history of recurrent elevated transaminases in setting of hepatic steatosis, hypertension, chronic abdominal back pain on chronic narcotics, physical deconditioning with poor nutrition secondary to social situation/lack of funds. At time of last discharge patient was started on eliquis but apparently her initial prescription was sent to [facility] electronically instead of to [facility]. Because the patient takes multiple medication she was unaware that she had been missing this medication until about one week after discharge she received a call from the [facility] telling her the prescription was waiting for her at the pharmacy.  Today patient went to her primary care physician office with [facility] for routine hospital follow-up. Apparently she looked quite pale and was reported as hypotensive with heart rate in the 120s when she was there since he was sent immediately to the ER for further evaluation and treatment. She was transported by EMS because of nausea and Route she was given Zofran. Patient reports that for 2 weeks since discharge she has had some sharp chest pains which are not unusual for her since she has a documented history of chronic  chest pain, she is also had some nonspecific dizziness which she reports is similar to dizziness she had several years ago which was treated with meclizine. Her primary complaint as of now is regarding excoriated areas on the medial thighs that have developed since she was changed from Pampers to pull ups at last discharge.  ED Course:  PO 99.3-BP 111/93 supine-pulse 1:30-respirations 13-room air saturations 100% Follow-up vital signs: BP 125/89-pulse 120-respirations 18-saturations documented at 86% but I was in the room with the patient with these readings in place and there was an extremely flattened pulse oximetry waveform at that time and patient was quite alert without any dyspnea or difficulty talking Portable chest x-ray: No acute process VQ scan: Ordered by ER but was not completed prior to calling hospitalist service to admit the patient Lab data: Sodium 139, potassium 3.6, chloride 95, CO2 21, BUN less than 5, creatinine 1.04, calcium 8.0, glucose 15, anion gap 23, alkaline phosphatase 192, albumin 2.4, AST 139, ALT 45, total bilirubin normal, troponin less than 0.03 lactic acid had been ordered but had not resulted at time request for admission call to hospitalist service, WBC 15,700 with neutrophils 86%, absolute neutrophils 13.3%; urinalysis abnormal and concerning for UTI (although also contaminated with squamous epithelials) but many bacteria, large leukocytes, positive nitrite, Debbie BCC numerous to count, urine culture obtained in ER Normal daily and bolus 500 mL 1 Oxycodone 5 mg by mouth 1 Levaquin 750 mg IV 1 Second normal saline bolus 500 mL IV 1  Review of Systems:  In addition to the HPI above,  No Fever-chills, myalgias or other constitutional symptoms No Headache, changes with Vision or hearing, new weakness, tingling, numbness in any extremity, No  problems swallowing food or Liquids, indigestion/reflux No Cough or Shortness of Breath, palpitations, orthopnea or  DOE No melena or hematochezia, no dark tarry stools, Bowel movements are typically diarrheal in nature No dysuria, hematuria or flank pain No new skin lesions, masses or bruises, No new joints pains-aches No recent weight gain or loss No polyuria, polydypsia or polyphagia,   Past Medical History  Diagnosis Date  . Glaucoma   . Diabetes mellitus without complication (HCC)   . Hypertension   . Neuropathy (HCC)   . Crohn disease (HCC)   . Asthma   . Pulmonary embolus (HCC) 05/24/2014  . Hypothyroidism   . GERD (gastroesophageal reflux disease)   . HLD (hyperlipidemia)   . C. difficile colitis   . Pancreatitis   . Asthma   . Tobacco abuse     Past Surgical History  Procedure Laterality Date  . Cesarean section    . Esophagogastroduodenoscopy N/A 05/21/2014    Procedure: ESOPHAGOGASTRODUODENOSCOPY (EGD);  Surgeon: Petra Kuba, MD;  Location: [facility] ENDOSCOPY;  Service: Endoscopy;  Laterality: N/A;  . Esophagogastroduodenoscopy N/A 03/02/2015    Procedure: ESOPHAGOGASTRODUODENOSCOPY (EGD);  Surgeon: Bernette Redbird, MD;  Location: [facility] ENDOSCOPY;  Service: Endoscopy;  Laterality: N/A;  . Colonoscopy N/A 03/02/2015    Procedure: COLONOSCOPY;  Surgeon: Bernette Redbird, MD;  Location: [facility] ENDOSCOPY;  Service: Endoscopy;  Laterality: N/A;     reports that she has been smoking Cigarettes.  She has a 6.5 pack-year smoking history. She has never used smokeless tobacco. She reports that she does not drink alcohol or use illicit drugs.  Mobility: Without assistive devices Work history: Disabled   Allergies  Allergen Reactions  . Iohexol Other (See Comments)    "severe burning" Patient has received Contrast in 2005 with 13 hour pre-medication, and had no reaction at that time  . Spiriva Handihaler [Tiotropium Bromide Monohydrate] Nausea And Vomiting  . Ativan [Lorazepam] Other (See Comments)    hallucinations  . Penicillins Hives    Has had cephalosporins    Family History  Problem  Relation Age of Onset  . Breast cancer Mother   . Heart disease Mother 84    CABG  . Diabetes Mother   . Heart disease Father 62    CABG  . Diabetes Father      Prior to Admission medications   Medication Sig Start Date End Date Taking? Authorizing Provider  apixaban (ELIQUIS) 5 MG TABS tablet Take 2 tablets (10 mg total) by mouth 2 (two) times daily. 02/07/16   Leroy Sea, MD  apixaban (ELIQUIS) 5 MG TABS tablet Take 1 tablet (5 mg total) by mouth 2 (two) times daily. 02/11/16   Leroy Sea, MD  calcium carbonate (OS-CAL - DOSED IN MG OF ELEMENTAL CALCIUM) 1250 (500 CA) MG tablet Take 1 tablet (500 mg of elemental calcium total) by mouth daily with breakfast. 03/04/15   Alison Murray, MD  cefpodoxime (VANTIN) 200 MG tablet Take 1 tablet (200 mg total) by mouth 2 (two) times daily. 02/07/16   Leroy Sea, MD  docusate sodium (COLACE) 100 MG capsule Take 2 capsules (200 mg total) by mouth 2 (two) times daily. 02/07/16   Leroy Sea, MD  feeding supplement, RESOURCE BREEZE, (RESOURCE BREEZE) LIQD Take 1 Container by mouth 3 (three) times daily between meals. 08/02/14   Kathlen Mody, MD  folic acid (FOLVITE) 1 MG tablet Take 1 tablet (1 mg total) by mouth daily. 08/02/14   Kathlen Mody, MD  gabapentin (  NEURONTIN) 100 MG capsule Take 1 capsule (100 mg total) by mouth 2 (two) times daily. 04/05/15   Vassie Loll, MD  HYDROcodone-acetaminophen (NORCO/VICODIN) 5-325 MG tablet Take 1 tablet by mouth every 6 (six) hours as needed. 01/09/16   Derwood Kaplan, MD  HYDROmorphone (DILAUDID) 4 MG tablet Take 8 mg by mouth every 4 (four) hours as needed for severe pain.    Historical Provider, MD  lactulose (CHRONULAC) 10 GM/15ML solution Take 15 mLs (10 g total) by mouth daily as needed for moderate constipation. Patient not taking: Reported on 01/09/2016 04/05/15   Vassie Loll, MD  levothyroxine (SYNTHROID, LEVOTHROID) 25 MCG tablet Take 1 tablet (25 mcg total) by mouth daily before breakfast.  08/02/14   Kathlen Mody, MD  loperamide (IMODIUM) 2 MG capsule Take 1 capsule (2 mg total) by mouth 2 (two) times daily as needed for diarrhea or loose stools. 02/08/16   Leana Roe Elgergawy, MD  meclizine (ANTIVERT) 25 MG tablet Take 25 mg by mouth 3 (three) times daily as needed for dizziness.    Historical Provider, MD  metoprolol tartrate (LOPRESSOR) 25 MG tablet Take 0.5 tablets (12.5 mg total) by mouth 2 (two) times daily. Patient not taking: Reported on 01/09/2016 04/05/15   Vassie Loll, MD  ondansetron (ZOFRAN ODT) 4 MG disintegrating tablet Take 1 tablet (4 mg total) by mouth every 8 (eight) hours as needed for nausea or vomiting. 01/09/16   Derwood Kaplan, MD  ondansetron (ZOFRAN) 4 MG tablet Take 4 mg by mouth every 8 (eight) hours as needed for nausea or vomiting.    Historical Provider, MD  oxyCODONE-acetaminophen (PERCOCET/ROXICET) 5-325 MG tablet Take 1 tablet by mouth every 6 (six) hours as needed for moderate pain. 02/08/16   Leana Roe Elgergawy, MD  pantoprazole (PROTONIX) 20 MG tablet Take 40 mg by mouth daily.    Historical Provider, MD  polyethylene glycol (MIRALAX / GLYCOLAX) packet Take 17 g by mouth daily. 02/07/16   Leroy Sea, MD  rosuvastatin (CRESTOR) 20 MG tablet Take 1 tablet (20 mg total) by mouth daily. 08/02/14   Kathlen Mody, MD  saccharomyces boulardii (FLORASTOR) 250 MG capsule Take 1 capsule (250 mg total) by mouth 2 (two) times daily. 04/05/15   Vassie Loll, MD  thiamine (VITAMIN B-1) 100 MG tablet Take 1 tablet (100 mg total) by mouth daily. 08/02/14   Kathlen Mody, MD  traZODone (DESYREL) 25 mg TABS tablet Take 0.5 tablets (25 mg total) by mouth at bedtime as needed for sleep. 08/02/14   Kathlen Mody, MD  venlafaxine (EFFEXOR) 25 MG tablet Take 1 tablet (25 mg total) by mouth 2 (two) times daily. 03/04/15   Alison Murray, MD    Physical Exam: Filed Vitals:   02/28/16 1430 02/28/16 1500 02/28/16 1700 02/28/16 1752  BP: 104/82 105/84 125/89   Pulse: 103  65 122   Temp:      TempSrc:      Resp: 21 18 31 18   Height:  5\' 5"  (1.651 m)    Weight:  116 lb 6.5 oz (52.8 kg)    SpO2: 100%  86% 87%      Constitutional: NAD, calm, comfortable-V3 animated and talkative Eyes: PERRL, lids and conjunctivae normal ENMT: Mucous membranes are dry. Posterior pharynx clear of any exudate or lesions.Normal dentition.  Neck: normal, supple, no masses, no thyromegaly Respiratory: clear to auscultation bilaterally, no wheezing, no crackles. Normal respiratory effort. No accessory muscle use.  Cardiovascular: Regular rate and rhythm, no murmurs / rubs /  gallops. No extremity edema. 2+ pedal pulses. No carotid bruits.  Abdomen: no tenderness to palpation, no masses palpated. No hepatosplenomegaly. Bowel sounds positive.  Musculoskeletal: no clubbing / cyanosis. No joint deformity upper and lower extremities. Good ROM, no contractures. Normal muscle tone.  Skin: no rashes, lesions, ulcers. No induration-periphery somewhat cool to the touch but with adequate capillary refill Neurologic: CN 2-12 grossly intact. Sensation intact, DTR normal. Strength 5/5 x all 4 extremities.  Psychiatric: Normal judgment and insight. Alert and oriented x 3. Normal mood.    Labs on Admission: I have personally reviewed following labs and imaging studies  CBC:  Recent Labs Lab 02/28/16 1315  WBC 15.7*  NEUTROABS 13.3*  HGB 11.6*  HCT 35.8*  MCV 96.5  PLT 172   Basic Metabolic Panel:  Recent Labs Lab 02/28/16 1315  NA 139  K 3.6  CL 95*  CO2 21*  GLUCOSE 105*  BUN <5*  CREATININE 1.04*  CALCIUM 8.0*   GFR: Estimated Creatinine Clearance: 49.7 mL/min (by C-G formula based on Cr of 1.04). Liver Function Tests:  Recent Labs Lab 02/28/16 1315  AST 139*  ALT 45  ALKPHOS 192*  BILITOT 0.9  PROT 5.9*  ALBUMIN 2.4*   No results for input(s): LIPASE, AMYLASE in the last 168 hours. No results for input(s): AMMONIA in the last 168 hours. Coagulation Profile: No  results for input(s): INR, PROTIME in the last 168 hours. Cardiac Enzymes:  Recent Labs Lab 02/28/16 1315  TROPONINI <0.03   BNP (last 3 results) No results for input(s): PROBNP in the last 8760 hours. HbA1C: No results for input(s): HGBA1C in the last 72 hours. CBG: No results for input(s): GLUCAP in the last 168 hours. Lipid Profile: No results for input(s): CHOL, HDL, LDLCALC, TRIG, CHOLHDL, LDLDIRECT in the last 72 hours. Thyroid Function Tests: No results for input(s): TSH, T4TOTAL, FREET4, T3FREE, THYROIDAB in the last 72 hours. Anemia Panel: No results for input(s): VITAMINB12, FOLATE, FERRITIN, TIBC, IRON, RETICCTPCT in the last 72 hours. Urine analysis:    Component Value Date/Time   COLORURINE AMBER* 02/28/2016 1302   APPEARANCEUR TURBID* 02/28/2016 1302   LABSPEC 1.026 02/28/2016 1302   PHURINE 5.5 02/28/2016 1302   GLUCOSEU NEGATIVE 02/28/2016 1302   HGBUR MODERATE* 02/28/2016 1302   BILIRUBINUR SMALL* 02/28/2016 1302   KETONESUR 15* 02/28/2016 1302   PROTEINUR 100* 02/28/2016 1302   UROBILINOGEN 1.0 02/28/2015 1227   NITRITE POSITIVE* 02/28/2016 1302   LEUKOCYTESUR LARGE* 02/28/2016 1302   Sepsis Labs: @LABRCNTIP (procalcitonin:4,lacticidven:4) )No results found for this or any previous visit (from the past 240 hour(s)).   Radiological Exams on Admission: Nm Pulmonary Perf And Vent  02/28/2016  CLINICAL DATA:  Current increasing fatigue which shortness-of-breath and left-sided chest pain with palpitations and near syncope. History of pulmonary embolism and DVT. EXAM: NUCLEAR MEDICINE VENTILATION - PERFUSION LUNG SCAN TECHNIQUE: Ventilation images were obtained in multiple projections using inhaled aerosol Tc-26m DTPA. Perfusion images were obtained in multiple projections after intravenous injection of Tc-39m MAA. RADIOPHARMACEUTICALS:  32.0 MCi Technetium-53m DTPA aerosol inhalation and 4.1 mCi Technetium-60m MAA IV COMPARISON:  Chest x-ray 02/28/2016 and  abdominal CT 02/06/2016 FINDINGS: Ventilation: No focal ventilation defect. Subtle perihilar heterogeneity. Perfusion: No wedge shaped peripheral perfusion defects to suggest acute pulmonary embolism. IMPRESSION: Normal ventilation perfusion lung scan. Electronically Signed   By: Elberta Fortis M.D.   On: 02/28/2016 16:54   Dg Chest Port 1 View  02/28/2016  CLINICAL DATA:  55 year old with 2 week history of  mid to left-sided chest pain, shortness of breath and tachycardia. Current smoker. EXAM: PORTABLE CHEST 1 VIEW COMPARISON:  02/05/2016, 01/29/2016 and earlier, including CTA chest 03/31/2015. FINDINGS: Cardiac silhouette normal in size, unchanged. Thoracic aorta mildly tortuous and atherosclerotic, unchanged. Hilar and mediastinal contours otherwise unremarkable. Lungs clear. Bronchovascular markings normal. Pulmonary vascularity normal. No visible pleural effusions. No pneumothorax. IMPRESSION: No acute cardiopulmonary disease. Electronically Signed   By: Hulan Saas M.D.   On: 02/28/2016 15:08    EKG: (Independently reviewed) sinus tachycardia with ventricular rate 123 bpm, QTC 489 ms, no ischemic changes  Assessment/Plan Principal Problem:   ? Sepsis /UTI (lower urinary tract infection) -Patient appears to have urinary tract infection with highly abnormal urinalysis and culture pending -Despite significantly elevated lactic acid level patient does not appear to be truly septic but we will watch her closely and treat as if she is -Follow up on blood and urine cultures -See below regarding dehydration and metabolic acidemia -Patient having persistent tachycardia which seems to be mediated by significant volume depletion; she also apparently hasn't taken a beta blocker for several months -Continue Levaquin  Active Problems:   Metabolic acidosis -Lactic acid 12.76 -Patient presented with low-grade temperature of 99.3, heart rate was greater than 90, respirations were normal, mentation  normal WBCs 15,700, CO2 21 with creatinine double her baseline but systolic blood pressure greater than 100 and him a peak consistently greater than 65 therefore a mixed picture and suspect low perfusion state driven by dehydration as opposed to infectious etiology -Continue to cycle lactic acid -Volume resuscitation; they should has received 1 L of fluids in the ER and will give 2 additional liters and continue IV fluids at at least 100 mL per hour -See above regarding treating suspected UTI    Dehydration/Chronic diarrhea of unknown origin -Patient apparently with continued chronic diarrhea -Very difficult historian difficult to keep her on track when assessing her and and obtaining history -Previously documented by case management that she only only has $27 per month in food stamps and at times has to go hungry-she told me she typically eats lemon and orange slices -Continue fluids as above -I have discontinued any stool softeners or laxatives    May-Thurner syndrome -Recent thrombolysis and stent placement earlier in May in setting of acute significant extensive left lower extremity DVT -Still has some residual left lower extremity swelling -Due to confusion regarding destination of new eliquis prescription since discharge patient had not taken eliquis until 5 days ago -Given her presenting symptoms with weakness and tachycardia acute PE was in the differential but subsequently VQ scan has returned negative -Continue eliquis    DM2 (diabetes mellitus, type 2)  -Currently controlled -Not on meds prior to admission -HgbA1c was 6.0 previous admission -Follow CBGs and provide SSI    HTN (hypertension) -Stopped taking beta blocker in April in current blood pressure not well controlled and also with persistent tachycardia    Hepatic steatosis -Discovered during routine imaging last admission -LFTs had normalized at time of discharge better up now and suspect related to low perfusion in  setting of dehydration and metabolic acidemia -Follow LFTs to ensure trending downward    Hypothyroidism -Continue Synthroid -Last TSH June 2016 so we'll repeat this admission especially with persistent tachycardia    HLD (hyperlipidemia) -Continue Crestor    Excoriated rash-medial thighs -History of report developed after being changed from Pampers to pull ups last admission -Barrier cream -WOC RN evaluation    Asthma -Stable without wheezing  Normocytic anemia -Hemoglobin actually higher than at discharge; currently hgb is 11.6 and at discharge it was 9.0 which is further suggestive of volume depletion -CBC in a.m.    Physical deconditioning -PT and OT evaluation    Severe protein-calorie malnutrition  -Nutrition consultation    Chronic pain -Continue home Vicodin      DVT prophylaxis: Eliquis Code Status: Full code Family Communication: Family at bedside  Disposition Plan: Anticipate discharge back to preadmission home in [place] Consults called: None  Admission status: Stepdown unit/inpatient    Zacarias Krauter L. ANP-BC [facility] Pager [phone]   If 7PM-7AM, please contact night-coverage [email or web address] Password [email or web address]  02/28/2016, 6:00 PM

## 2016-02-29 ENCOUNTER — Telehealth: Payer: Self-pay

## 2016-02-29 DIAGNOSIS — K529 Noninfective gastroenteritis and colitis, unspecified: Secondary | ICD-10-CM

## 2016-02-29 DIAGNOSIS — A419 Sepsis, unspecified organism: Secondary | ICD-10-CM | POA: Insufficient documentation

## 2016-02-29 DIAGNOSIS — G8929 Other chronic pain: Secondary | ICD-10-CM

## 2016-02-29 LAB — URINE CULTURE

## 2016-02-29 LAB — GLUCOSE, CAPILLARY
GLUCOSE-CAPILLARY: 78 mg/dL (ref 65–99)
GLUCOSE-CAPILLARY: 90 mg/dL (ref 65–99)
Glucose-Capillary: 84 mg/dL (ref 65–99)
Glucose-Capillary: 102 mg/dL — ABNORMAL HIGH (ref 65–99)

## 2016-02-29 LAB — TSH: TSH: 1.457 u[IU]/mL (ref 0.350–4.500)

## 2016-02-29 MED ORDER — HYDROCODONE-ACETAMINOPHEN 5-325 MG PO TABS
1.0000 | ORAL_TABLET | ORAL | Status: DC | PRN
  Administered 2016-02-29 – 2016-03-01 (×3): 2 via ORAL
  Filled 2016-02-29 (×3): qty 2

## 2016-02-29 MED ORDER — LEVOFLOXACIN 750 MG PO TABS
750.0000 mg | ORAL_TABLET | Freq: Every day | ORAL | Status: DC
  Filled 2016-02-29: qty 1

## 2016-02-29 MED ORDER — SODIUM CHLORIDE 0.9 % IV BOLUS (SEPSIS)
1000.0000 mL | Freq: Once | INTRAVENOUS | Status: AC
Start: 2016-02-29 — End: 2016-02-29
  Administered 2016-02-29: 1000 mL via INTRAVENOUS

## 2016-02-29 MED ORDER — LEVOFLOXACIN 750 MG PO TABS
750.0000 mg | ORAL_TABLET | Freq: Every day | ORAL | Status: DC
  Administered 2016-03-01 – 2016-03-03 (×3): 750 mg via ORAL
  Filled 2016-02-29 (×3): qty 1

## 2016-02-29 MED ORDER — ONDANSETRON HCL 4 MG/2ML IJ SOLN
4.0000 mg | Freq: Three times a day (TID) | INTRAMUSCULAR | Status: DC | PRN
Start: 2016-02-29 — End: 2016-03-06
  Administered 2016-03-02 – 2016-03-03 (×2): 4 mg via INTRAVENOUS
  Filled 2016-02-29 (×2): qty 2

## 2016-02-29 MED ORDER — ONDANSETRON HCL 4 MG/2ML IJ SOLN: 4.0000 mg | Freq: Three times a day (TID) | INTRAMUSCULAR | Status: DC

## 2016-02-29 NOTE — Progress Notes (Signed)
Patient ID: [name], female   DOB: 22-Oct-1960, 55 y.o.   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 Pt went to see her PCP and was found to be pale with HR in 120's. She was sent to ER for evaluation.  Assessment & Plan:   Sepsis /UTI (lower urinary tract infection) - Patient appears to have urinary tract infection with highly abnormal urinalysis and culture pending - pt clinically improving, reports feeling better - Follow up on blood and urine cultures - continue Levaquin day #2   Metabolic acidosis - secondary to the above  - continue to provide IVF, BMP in AM - repeat lactic acid in am to ensure clearance    Dehydration/Chronic diarrhea of unknown origin - Patient apparently with continued chronic diarrhea - monitor for now    May-Thurner syndrome - Recent thrombolysis and stent placement earlier in May in setting of acute, extensive left lower extremity DVT - Still has some residual left lower extremity swelling - Continue eliquis   DM2 (diabetes mellitus, type 2)  - Not on meds prior to admission - HgbA1c was 6.0 previous admission - Follow CBGs and provide SSI   HTN (hypertension), essential  - holding any antihypertensive until BP stabilizes    Hepatic steatosis - Follow LFTs to ensure trending downward   Hypothyroidism - Continue Synthroid   HLD (hyperlipidemia) - Continue Crestor   Excoriated rash-medial thighs - History of report developed after being  changed from [place] to pull ups last admission - Barrier cream - WOC RN evaluation   Asthma - Stable without wheezing   Normocytic anemia, thrombocytopenia  - no signs of bleeding but Hg down from 11 --> 9, monitor closely  - monitor Platelets  - CBC in a.m.   Physical deconditioning - PT and OT evaluation   Severe protein-calorie malnutrition  - Nutrition consultation   Chronic pain - Continue home Vicodin  DVT prophylaxis: on Eliquis  Code Status: Full  Family Communication: Patient at bedside  Disposition Plan: Home by June 2nd  Consultants:   None  Procedures:   None  Antimicrobials:   Levaquin 5/30 -->  Subjective: Reports feeling better, still with LE pain bilaterally   Objective: Filed Vitals:   02/29/16 1230 02/29/16 1240 02/29/16 1400 02/29/16 1559  BP: 106/82 106/82 111/77   Pulse: 93 93 95 94  Temp:  98.7 F (37.1 C)  98.7 F (37.1 C)  TempSrc:  Oral  Oral  Resp: 19 11 14    Height:      Weight:      SpO2: 100% 100% 100% 99%    Intake/Output Summary (Last 24 hours) at 02/29/16 1910 Last data filed at 02/29/16 0700  Gross per 24 hour  Intake   2200 ml  Output    100 ml  Net   2100 ml   Filed Weights   02/28/16 1500  Weight: 52.8 kg (116 lb 6.5 oz)    Examination:  General exam: Appears calm and comfortable  Respiratory system: Clear to auscultation.  Respiratory effort normal. Cardiovascular system: S1 & S2 heard, RRR. No JVD, rubs, gallops or clicks. No pedal edema. Gastrointestinal system: Abdomen is nondistended, soft and nontender.  Central nervous system: Alert and oriented. No focal neurological deficits. Psychiatry: Judgement and insight appear normal. Mood & affect appropriate.   Data Reviewed: I have personally reviewed following labs and imaging studies  CBC:  Recent Labs Lab 02/28/16 1315 02/28/16 2320  WBC 15.7* 10.1  NEUTROABS 13.3* 7.2  HGB 11.6* 9.3*  HCT 35.8* 29.1*  MCV 96.5 97.3  PLT 172 128*    Basic Metabolic Panel:  Recent Labs Lab 02/28/16 1315 02/28/16 2320  NA 139 138  K 3.6 3.4*  CL 95* 104  CO2 21* 19*  GLUCOSE 105* 98  BUN <5* <5*  CREATININE 1.04* 1.01*  CALCIUM 8.0* 6.8*   Liver Function Tests:  Recent Labs Lab 02/28/16 1315 02/28/16 2320  AST 139* 114*  ALT 45 36  ALKPHOS 192* 155*  BILITOT 0.9 0.7  PROT 5.9* 4.5*  ALBUMIN 2.4* 1.8*   Cardiac Enzymes:  Recent Labs Lab 02/28/16 1315  TROPONINI <0.03   CBG:  Recent Labs Lab 02/28/16 2211 02/29/16 0738 02/29/16 1235 02/29/16 1704  GLUCAP 129* 84 78 90   Thyroid Function Tests:  Recent Labs  02/28/16 2320  TSH 1.457   Urine analysis:    Component Value Date/Time   COLORURINE AMBER* 02/28/2016 1302   APPEARANCEUR TURBID* 02/28/2016 1302   LABSPEC 1.026 02/28/2016 1302   PHURINE 5.5 02/28/2016 1302   GLUCOSEU NEGATIVE 02/28/2016 1302   HGBUR MODERATE* 02/28/2016 1302   BILIRUBINUR SMALL* 02/28/2016 1302   KETONESUR 15* 02/28/2016 1302   PROTEINUR 100* 02/28/2016 1302   UROBILINOGEN 1.0 02/28/2015 1227   NITRITE POSITIVE* 02/28/2016 1302   LEUKOCYTESUR LARGE* 02/28/2016 1302   Recent Results (from the past 240 hour(s))  Urine culture     Status: Abnormal   Collection Time: 02/28/16 12:10 PM  Result Value Ref Range Status   Specimen Description URINE, CLEAN CATCH  Final   Special Requests NONE  Final   Culture MULTIPLE SPECIES PRESENT, SUGGEST RECOLLECTION (A)  Final   Report Status 02/29/2016 FINAL  Final  Blood Culture (routine x 2)     Status: None (Preliminary result)   Collection Time: 02/28/16  4:30 PM  Result Value Ref Range Status   Specimen Description BLOOD LEFT HAND  Final   Special Requests IN PEDIATRIC BOTTLE 1CC  Final   Culture NO GROWTH < 24 HOURS  Final   Report Status PENDING  Incomplete  Blood Culture (routine x 2)     Status: None (Preliminary result)   Collection Time: 02/28/16  4:43 PM  Result Value Ref Range Status   Specimen Description  BLOOD RIGHT FOREARM  Final   Special Requests IN PEDIATRIC BOTTLE 3CC  Final   Culture NO GROWTH < 24 HOURS  Final   Report Status PENDING  Incomplete      Radiology Studies: Nm Pulmonary Perf And Vent  02/28/2016  CLINICAL DATA:  Current increasing fatigue which shortness-of-breath and left-sided chest pain with palpitations and near syncope. History of pulmonary embolism and DVT. EXAM: NUCLEAR MEDICINE VENTILATION - PERFUSION LUNG SCAN TECHNIQUE: Ventilation images were obtained in multiple projections using inhaled aerosol Tc-37m DTPA. Perfusion images were obtained in multiple projections after intravenous injection of Tc-44m MAA. RADIOPHARMACEUTICALS:  32.0 MCi Technetium-64m DTPA aerosol inhalation and 4.1 mCi Technetium-71m MAA IV COMPARISON:  Chest x-ray 02/28/2016 and abdominal CT 02/06/2016 FINDINGS: Ventilation:  No focal ventilation defect. Subtle perihilar heterogeneity. Perfusion: No wedge shaped peripheral perfusion defects to suggest acute pulmonary embolism. IMPRESSION: Normal ventilation perfusion lung scan. Electronically Signed   By: Elberta Fortis M.D.   On: 02/28/2016 16:54   Dg Chest Port 1 View  02/28/2016  CLINICAL DATA:  55 year old with 2 week history of mid to left-sided chest pain, shortness of breath and tachycardia. Current smoker. EXAM: PORTABLE CHEST 1 VIEW COMPARISON:  02/05/2016, 01/29/2016 and earlier, including CTA chest 03/31/2015. FINDINGS: Cardiac silhouette normal in size, unchanged. Thoracic aorta mildly tortuous and atherosclerotic, unchanged. Hilar and mediastinal contours otherwise unremarkable. Lungs clear. Bronchovascular markings normal. Pulmonary vascularity normal. No visible pleural effusions. No pneumothorax. IMPRESSION: No acute cardiopulmonary disease. Electronically Signed   By: Hulan Saas M.D.   On: 02/28/2016 15:08      Scheduled Meds: . apixaban  10 mg Oral BID  . calcium carbonate  1 tablet Oral Q breakfast  . feeding supplement  1  Container Oral TID BM  . fluconazole  50 mg Oral Daily  . folic acid  1 mg Oral Daily  . gabapentin  100 mg Oral BID  . insulin aspart  0-5 Units Subcutaneous QHS  . insulin aspart  0-9 Units Subcutaneous TID WC  . [START ON 03/01/2016] levofloxacin  750 mg Oral Daily  . levothyroxine  25 mcg Oral QAC breakfast  . pantoprazole  40 mg Oral Daily  . rosuvastatin  20 mg Oral Daily  . saccharomyces boulardii  250 mg Oral BID  . sodium chloride flush  3 mL Intravenous Q12H  . thiamine  100 mg Oral Daily  . venlafaxine  25 mg Oral BID   Continuous Infusions: . sodium chloride 100 mL/hr at 02/29/16 0700     LOS: 1 day    Time spent: 20 minutes    Debbora Presto, MD [facility] Pager [phone]  If 7PM-7AM, please contact night-coverage [email or web address] Password [email or web address] 02/29/2016, 7:10 PM

## 2016-02-29 NOTE — Progress Notes (Signed)
After PT left, pt requested that Care Tech April and I assist pt to the bedside commode. Pt is extremely weak and hardly able to bear weight on both legs. Pt took 45 minutes to transfer to bedside commode and back. Pt was insistent that physical therapy not assist with transfer. Pt became tachycardic into 170s during movement to and from bedside commode. Pt also became nauseous and spit up a little bit after returning to bed. RN reiterated the importance of physical therapy and her need to work with a therapist for safe transfer. Pt interrupted RN frequently and used hand motions to attempt to silence RN. Pt is non-compliant and unwilling to listen to advice or reason.

## 2016-02-29 NOTE — Progress Notes (Signed)
PT Cancellation Note  Patient Details Name: [name] MRN: [id number] DOB: 1961/08/20   Cancelled Treatment:    Reason Eval/Treat Not Completed: Medical issues which prohibited therapy. Pt reports she feels too bad..cramps, freezing. Wants a scheduled time with PT so she can "be prepared and not waste your time or mine." Scheduled with pt to see her tomorrow at 11:30 a.m. (pt would not schedule a time for today). Explained will do our best to come at scheduled time, however things do come up that are outside our control. She expressed understanding.   Jara Feider 02/29/2016, 9:25 AM  Pager [phone]

## 2016-02-29 NOTE — Progress Notes (Signed)
CRITICAL VALUE ALERT  Critical value received: lactic acid 9.8 Date of notification:  09/29/16 Time of notification:  2359 Critical value read back: yes Nurse who received alert:  km MD notified (1st page):  [facility] floor coverage [place] Time of first page:  0000

## 2016-02-29 NOTE — Consult Note (Signed)
WOC wound consult note Reason for Consult: Bilateral thigh excoriation Wound type: MASD, Moisture Associated Skin Damage, likely related to recent use of "pull ups" instead of incontinent briefs per patient.  Patient stated she has long standing incontinence. Wound bed: Superficial skin peeling with erythema to inner bilateral upper thighs  Periwound: WNL Dressing procedure/placement/frequency:  Moisture barrier cream at least daily and prn patient comfort.  Discussed POC with patient and bedside nurse.  Re consult if needed, will not follow at this time. Thanks Helmut Muster MSN, RN, CNS-BC, [employer]

## 2016-02-29 NOTE — Progress Notes (Signed)
Pt frequently removed BP cuff and BP readings were not recorded. Pt complained that BP cuff was causing swelling in her arm. No swelling noted. BP cuff replaced and RN reiterated that pt needs to wear cuff.

## 2016-02-29 NOTE — Progress Notes (Signed)
OT Cancellation Note  Patient Details Name: [name] MRN: [id number] DOB: 09-19-1961   Cancelled Treatment:    Reason Eval/Treat Not Completed: Patient declined, no reason specified (Will follow.)  Evern Bio 02/29/2016, 2:58 PM

## 2016-02-29 NOTE — Care Management Important Message (Signed)
Important Message  Patient Details  Name: AIGNER HORSEMAN MRN: [id number] Date of Birth: 1960-11-16   Medicare Important Message Given:  Yes    Bernadette Hoit 02/29/2016, 9:13 AM

## 2016-02-29 NOTE — Telephone Encounter (Signed)
Call placed to Junius Creamer, RN CM informing her that the [facility] has been trying to contact the patient about scheduling an appointment.; but it is noted that the patient has a PCP noted at [facility] and she was just seen in the office prior to being admitted to the hospital.

## 2016-02-29 NOTE — Discharge Instructions (Addendum)
Information on my medicine - ELIQUIS® (apixaban) ° °This medication education was reviewed with me or my healthcare representative as part of my discharge preparation.  The pharmacist that spoke with me during my hospital stay was:  Powell, Lisa Kay, RPH ° °Why was Eliquis® prescribed for you? °Eliquis® was prescribed for you to reduce the risk of forming blood clots that can cause a stroke if you have a medical condition called atrial fibrillation (a type of irregular heartbeat) OR to reduce the risk of a blood clots forming after orthopedic surgery. ° °What do You need to know about Eliquis® ? °Take your Eliquis® TWICE DAILY - one tablet in the morning and one tablet in the evening with or without food.  It would be best to take the doses about the same time each day. ° °If you have difficulty swallowing the tablet whole please discuss with your pharmacist how to take the medication safely. ° °Take Eliquis® exactly as prescribed by your doctor and DO NOT stop taking Eliquis® without talking to the doctor who prescribed the medication.  Stopping may increase your risk of developing a new clot or stroke.  Refill your prescription before you run out. ° °After discharge, you should have regular check-up appointments with your healthcare provider that is prescribing your Eliquis®.  In the future your dose may need to be changed if your kidney function or weight changes by a significant amount or as you get older. ° °What do you do if you miss a dose? °If you miss a dose, take it as soon as you remember on the same day and resume taking twice daily.  Do not take more than one dose of ELIQUIS at the same time. ° °Important Safety Information °A possible side effect of Eliquis® is bleeding. You should call your healthcare provider right away if you experience any of the following: °? Bleeding from an injury or your nose that does not stop. °? Unusual colored urine (red or dark brown) or unusual colored stools (red or  black). °? Unusual bruising for unknown reasons. °? A serious fall or if you hit your head (even if there is no bleeding). ° °Some medicines may interact with Eliquis® and might increase your risk of bleeding or clotting while on Eliquis®. To help avoid this, consult your healthcare provider or pharmacist prior to using any new prescription or non-prescription medications, including herbals, vitamins, non-steroidal anti-inflammatory drugs (NSAIDs) and supplements. ° °This website has more information on Eliquis® (apixaban): [email or web address].com. ° ° °

## 2016-03-01 LAB — BASIC METABOLIC PANEL
Anion gap: 7 (ref 5–15)
CALCIUM: 6.5 mg/dL — AB (ref 8.9–10.3)
CHLORIDE: 118 mmol/L — AB (ref 101–111)
CO2: 17 mmol/L — ABNORMAL LOW (ref 22–32)
CREATININE: 0.76 mg/dL (ref 0.44–1.00)
GFR calc Af Amer: >60 mL/min (ref >60)
GFR calc non Af Amer: >60 mL/min (ref >60)
Glucose, Bld: 68 mg/dL (ref 65–99)
Potassium: 3.3 mmol/L — ABNORMAL LOW (ref 3.5–5.1)
SODIUM: 142 mmol/L (ref 135–145)

## 2016-03-01 LAB — GLUCOSE, CAPILLARY
GLUCOSE-CAPILLARY: 91 mg/dL (ref 65–99)
GLUCOSE-CAPILLARY: 90 mg/dL (ref 65–99)
Glucose-Capillary: 69 mg/dL (ref 65–99)
Glucose-Capillary: 113 mg/dL — ABNORMAL HIGH (ref 65–99)
Glucose-Capillary: 116 mg/dL — ABNORMAL HIGH (ref 65–99)

## 2016-03-01 LAB — CBC
HCT: 31.4 % — ABNORMAL LOW (ref 36.0–46.0)
HEMOGLOBIN: 9.9 g/dL — AB (ref 12.0–15.0)
MCH: 31.4 pg (ref 26.0–34.0)
MCHC: 31.5 g/dL (ref 30.0–36.0)
MCV: 99.7 fL (ref 78.0–100.0)
PLATELETS: 106 10*3/uL — AB (ref 150–400)
RBC: 3.15 MIL/uL — ABNORMAL LOW (ref 3.87–5.11)
RDW: 18.9 % — AB (ref 11.5–15.5)
WBC: 9.9 10*3/uL (ref 4.0–10.5)

## 2016-03-01 LAB — HEMOGLOBIN A1C
Hgb A1c MFr Bld: 4.9 % (ref 4.8–5.6)
MEAN PLASMA GLUCOSE: 94 mg/dL

## 2016-03-01 LAB — LACTIC ACID, PLASMA: Lactic Acid, Venous: 3.7 mmol/L (ref 0.5–2.0)

## 2016-03-01 LAB — MRSA PCR SCREENING: MRSA BY PCR: NEGATIVE

## 2016-03-01 MED ORDER — HYDROCODONE-ACETAMINOPHEN 5-325 MG PO TABS
1.0000 | ORAL_TABLET | ORAL | Status: DC | PRN
  Administered 2016-03-01 – 2016-03-02 (×8): 2 via ORAL
  Filled 2016-03-01 (×9): qty 2

## 2016-03-01 MED ORDER — DIAZEPAM 2 MG PO TABS
2.0000 mg | ORAL_TABLET | Freq: Two times a day (BID) | ORAL | Status: DC | PRN
  Administered 2016-03-02 – 2016-03-06 (×8): 2 mg via ORAL
  Filled 2016-03-01 (×8): qty 1

## 2016-03-01 MED ORDER — CALCIUM CARBONATE ANTACID 500 MG PO CHEW
1.0000 | CHEWABLE_TABLET | Freq: Three times a day (TID) | ORAL | Status: DC
  Administered 2016-03-01 – 2016-03-02 (×4): 200 mg via ORAL
  Filled 2016-03-01 (×4): qty 1

## 2016-03-01 MED ORDER — PHENOL 1.4 % MT LIQD: 1.0000 | OROMUCOSAL | Status: DC | PRN

## 2016-03-01 MED ORDER — POTASSIUM CHLORIDE IN NACL 40-0.9 MEQ/L-% IV SOLN
INTRAVENOUS | Status: DC
  Administered 2016-03-01: 75 mL/h via INTRAVENOUS
  Filled 2016-03-01 (×3): qty 1000

## 2016-03-01 MED ORDER — MENTHOL 3 MG MT LOZG
1.0000 | LOZENGE | OROMUCOSAL | Status: DC | PRN
  Administered 2016-03-02: 3 mg via ORAL
  Filled 2016-03-01: qty 9

## 2016-03-01 MED ORDER — APIXABAN 5 MG PO TABS
5.0000 mg | ORAL_TABLET | Freq: Two times a day (BID) | ORAL | Status: DC
  Administered 2016-03-02 – 2016-03-06 (×9): 5 mg via ORAL
  Filled 2016-03-01 (×9): qty 1

## 2016-03-01 NOTE — Progress Notes (Signed)
Pt c/o CP 4/10 under left Breast, BP 119/82 (93) HR 88 RR Sats 98% spot checked, pt able to answer all questions,A&Ox4, pt refused to place BP cuff in favorable location for best results prior to above BP, EKG done per protocol, MD/N, nursing will cont to monitor

## 2016-03-01 NOTE — Progress Notes (Signed)
Patient ID: [name], female   DOB: 1961/07/23, 55 y.o.   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 Pt went to see her PCP and was found to be pale with HR in 120's. She was sent to ER for evaluation.  Assessment & Plan:   Sepsis /UTI (lower urinary tract infection) - Patient appears to have urinary tract infection with highly abnormal urinalysis and culture pending - pt clinically improving, reports feeling better - Follow up on blood and urine cultures - initial urine culture with multiple sp present and recollection was recommended, new order placed 6/1 - continue Levaquin day #3 - monitor vital signs - lactic acid clearing: 12 --> 9 --> 3 - repeat lactic acid in AM   Metabolic acidosis, hypokalemia  - secondary to the above  - continue to provide IVF, BMP in AM - supplement K via IVF - repeat lactic acid in am to ensure clearance    Dehydration/Chronic diarrhea of unknown origin - Patient apparently with continued chronic diarrhea - no reports of diarrhea per nursing staff  - monitor for now    May-Thurner syndrome - Recent thrombolysis and stent placement earlier in May in setting of acute, extensive left lower extremity DVT - Still has some residual left lower extremity swelling and pain  - Continue eliquis   DM2 (diabetes mellitus, type 2)  - Not on meds prior to admission - HgbA1c was 6.0 previous admission but now 4.9 - Follow CBGs and provide SSI - no need for  outpatient antihyperglycemic regimen    HTN (hypertension), essential  - holding any antihypertensive until BP stabilizes    Hepatic steatosis - Follow LFTs to ensure trending downward   Hypothyroidism - Continue Synthroid   HLD (hyperlipidemia) - Continue Crestor   Excoriated rash-medial thighs - unclear etiology, per pt from pull ups  - Barrier cream - WOC RN evaluation   Asthma - Stable without wheezing   Normocytic anemia, thrombocytopenia  - no signs of bleeding but Hg down from 11 --> 9, monitor closely  - monitor Platelets  - CBC in a.m.   Physical deconditioning - PT and OT evaluation requested, SNF recommended but pt refusing    Protein-calorie malnutrition  - Nutrition consultation requested    Chronic pain - Continue home Vicodin  DVT prophylaxis: on Eliquis  Code Status: Full  Family Communication: Patient at bedside  Disposition Plan: Home by June 3rd or 4th when sepsis resolved   Consultants:   None  Procedures:   None  Antimicrobials:   Levaquin 5/30 -->  Subjective: Reports feeling better, still with LE pain bilaterally   Objective: Filed Vitals:   03/01/16 1019 03/01/16 1140 03/01/16 1600 03/01/16 1601  BP: 119/82 136/97 132/79   Pulse:      Temp:  97.8 F (36.6 C)  98 F (36.7 C)  TempSrc:  Oral  Oral  Resp: 15  31   Height:      Weight:      SpO2:    100%  Intake/Output Summary (Last 24 hours) at 03/01/16 1800 Last data filed at 03/01/16 0800  Gross per 24 hour  Intake 2503.33 ml  Output      0 ml  Net 2503.33 ml   Filed Weights   02/28/16 1500 03/01/16 0427  Weight: 52.8 kg (116 lb 6.5 oz) 49.034 kg (108 lb 1.6 oz)    Examination:  General exam: Appears calm and comfortable  Respiratory system: Clear to auscultation. Respiratory effort normal. Cardiovascular system: S1 & S2 heard, RRR. No JVD, rubs, gallops or clicks. LLE mild edema Gastrointestinal system: Abdomen is nondistended, soft and  nontender.  Central nervous system: Alert and oriented. No focal neurological deficits. Psychiatry: Judgement and insight appear normal. Mood & affect appropriate.   Data Reviewed: I have personally reviewed following labs and imaging studies  CBC:  Recent Labs Lab 02/28/16 1315 02/28/16 2320 03/01/16 0543  WBC 15.7* 10.1 9.9  NEUTROABS 13.3* 7.2  --   HGB 11.6* 9.3* 9.9*  HCT 35.8* 29.1* 31.4*  MCV 96.5 97.3 99.7  PLT 172 128* 106*   Basic Metabolic Panel:  Recent Labs Lab 02/28/16 1315 02/28/16 2320 03/01/16 0754  NA 139 138 142  K 3.6 3.4* 3.3*  CL 95* 104 118*  CO2 21* 19* 17*  GLUCOSE 105* 98 68  BUN <5* <5* <5*  CREATININE 1.04* 1.01* 0.76  CALCIUM 8.0* 6.8* 6.5*   Liver Function Tests:  Recent Labs Lab 02/28/16 1315 02/28/16 2320  AST 139* 114*  ALT 45 36  ALKPHOS 192* 155*  BILITOT 0.9 0.7  PROT 5.9* 4.5*  ALBUMIN 2.4* 1.8*   Cardiac Enzymes:  Recent Labs Lab 02/28/16 1315  TROPONINI <0.03   CBG:  Recent Labs Lab 02/29/16 2125 03/01/16 0815 03/01/16 1003 03/01/16 1318 03/01/16 1701  GLUCAP 102* 69 113* 91 90   Thyroid Function Tests:  Recent Labs  02/28/16 2320  TSH 1.457   Urine analysis:    Component Value Date/Time   COLORURINE AMBER* 02/28/2016 1302   APPEARANCEUR TURBID* 02/28/2016 1302   LABSPEC 1.026 02/28/2016 1302   PHURINE 5.5 02/28/2016 1302   GLUCOSEU NEGATIVE 02/28/2016 1302   HGBUR MODERATE* 02/28/2016 1302   BILIRUBINUR SMALL* 02/28/2016 1302   KETONESUR 15* 02/28/2016 1302   PROTEINUR 100* 02/28/2016 1302   UROBILINOGEN 1.0 02/28/2015 1227   NITRITE POSITIVE* 02/28/2016 1302   LEUKOCYTESUR LARGE* 02/28/2016 1302   Recent Results (from the past 240 hour(s))  Urine culture     Status: Abnormal   Collection Time: 02/28/16 12:10 PM  Result Value Ref Range Status   Specimen Description URINE, CLEAN CATCH  Final   Special Requests NONE  Final   Culture MULTIPLE SPECIES PRESENT, SUGGEST RECOLLECTION (A)   Final   Report Status 02/29/2016 FINAL  Final  Blood Culture (routine x 2)     Status: None (Preliminary result)   Collection Time: 02/28/16  4:30 PM  Result Value Ref Range Status   Specimen Description BLOOD LEFT HAND  Final   Special Requests IN PEDIATRIC BOTTLE 1CC  Final   Culture NO GROWTH 2 DAYS  Final   Report Status PENDING  Incomplete  Blood Culture (routine x 2)     Status: None (Preliminary result)   Collection Time: 02/28/16  4:43 PM  Result Value Ref Range Status   Specimen Description BLOOD RIGHT FOREARM  Final   Special Requests IN PEDIATRIC BOTTLE 3CC  Final   Culture NO GROWTH 2 DAYS  Final   Report Status PENDING  Incomplete      Radiology Studies: No results found.   Scheduled Meds: . apixaban  10 mg Oral BID  . [START ON 03/02/2016] apixaban  5 mg Oral BID  . calcium carbonate  1 tablet Oral Q breakfast  . calcium carbonate  1 tablet Oral TID  . feeding supplement  1 Container Oral TID BM  . fluconazole  50 mg Oral Daily  . folic acid  1 mg Oral Daily  . gabapentin  100 mg Oral BID  . insulin aspart  0-5 Units Subcutaneous QHS  . insulin aspart  0-9 Units Subcutaneous TID WC  . levofloxacin  750 mg Oral Daily  . levothyroxine  25 mcg Oral QAC breakfast  . pantoprazole  40 mg Oral Daily  . rosuvastatin  20 mg Oral Daily  . saccharomyces boulardii  250 mg Oral BID  . sodium chloride flush  3 mL Intravenous Q12H  . thiamine  100 mg Oral Daily  . venlafaxine  25 mg Oral BID   Continuous Infusions: . sodium chloride 100 mL/hr at 03/01/16 0426     LOS: 2 days   Time spent: 20 minutes   Debbora Presto, MD [facility] Pager [phone]  If 7PM-7AM, please contact night-coverage [email or web address] Password [email or web address] 03/01/2016, 6:00 PM

## 2016-03-01 NOTE — Progress Notes (Signed)
CRITICAL VALUE ALERT  Critical value received:  lactic Acid: 3.7  Date of notification:  6/1  Time of notification:  0855  Critical value read back:Yes.    Nurse who received alert:  Rod Mae  MD notified (1st page):  Izola Price, I  Time of first page:  [phone]  MD notified (2nd page):  Time of second page:  Responding MD:  Izola Price  Time MD responded:  0900

## 2016-03-01 NOTE — Evaluation (Signed)
Physical Therapy Evaluation Patient Details Name: [name] MRN: [id number] DOB: 03/23/1961 Today's Date: 03/01/2016   History of Present Illness  55 y.o. female with multiple comorbidities including type 2 diabetes mellitus, hypothyroidism, severe protein calorie malnutrition, Crohn's, history of pulmonary embolism, presented to the emergency department 4/30 with complaints of Left lower extremity pain. Ultrasound in ED which demonstrated occlusive DVT throughout the L lower extremity. 5/2 she went to IR for venous catheter placement for thrombolysis. 5/30 presented from primary MD office due to ?sepsis/?urosepsis (tachycardia, hypotension).     Clinical Impression  Pt admitted with above diagnosis. Patient minimally participated in PT, yet makes it clear she is NOT refusing. Talks over therapist and unwilling to change the way she performs exercises or demonstrate her transfers or walking. Pt currently with functional limitations due to the deficits listed below (see PT Problem List). She adamantly refuses SNF, although does not have 24/7 supervision/assist at home. Pt might benefit from skilled PT to increase their independence and safety with mobility. Will attempt trial of PT and assess if pt will cooperate/benefit.     Follow Up Recommendations SNF; Supervision/Assistance - 24 hour (if patient continues to refuse SNF, recommend max HH services she qualifies for, including HHPT--if she agrees)    Equipment Recommendations  Other (comment) (drop-arm 3n1 (will defer to OT))    Recommendations for Other Services       Precautions / Restrictions Precautions Precautions: Fall Precaution Comments: knees buckle      Mobility  Bed Mobility Overal bed mobility: Needs Assistance Bed Mobility: Rolling;Supine to Sit Rolling: Supervision   Supine to sit: Supervision     General bed mobility comments: Pt refused to move to EOB; pulls herself to long-sitting; supervision for  safety/lines  Transfers                 General transfer comment: Pt refused all OOB activity despite max encouragement. Lengthy and tangential conversation re: home transfers. Overall, it seems she has aide or son put arm around her waist to help lift her and walk to bathroom (~8 ft)  Ambulation/Gait                Administrator mobility:  (reports no self-propulsion; others push for her)  Modified Rankin (Stroke Patients Only)       Balance Overall balance assessment: History of Falls (pt refused further mobility/assessment)                                           Pertinent Vitals/Pain HR  Varied 90-140s throughout session; primarily 110-120s  Pain Assessment: Faces Faces Pain Scale: Hurts little more Pain Location: LLE, back and abd Pain Descriptors / Indicators: Grimacing;Guarding Pain Intervention(s): Limited activity within patient's tolerance;Monitored during session;Patient requesting pain meds-RN notified;RN gave pain meds during session    Home Living Family/patient expects to be discharged to:: Private residence Living Arrangements: Children (son) Available Help at Discharge: Family;Personal care attendant;Available PRN/intermittently Type of Home: Apartment Home Access: Level entry     Home Layout: One level Home Equipment: Walker - 2 wheels;Cane - single point;Hospital bed;Tub bench;Wheelchair - manual (wears depends) Additional Comments: Pt has aide four hours in am and four hours in pm 7 days/week.  Her teenage son assists in between visits, but pt reports  he is unreliable     Prior Function Level of Independence: Needs assistance   Gait / Transfers Assistance Needed: Pt said she walked holding onto furniture and aide, but fall occured when son was assisting her transfer into bed.  Recent hospital stay and PT highly recommended SNF at that time and pt refused. She  reports she does not feel RW assists or improves her safety  ADL's / Homemaking Assistance Needed: Pt reports she performs most of her ADLs. She reports she wears diapers at home, and has incontinent episodes. Pt providing inconsistencies throughout evaluation.  She initially states she needs her diapers due to incontinent episodes, but later states she always ambulates to the bathroom   Comments: Pt reports she uses SPC and furniture walking      Hand Dominance   Dominant Hand: Left    Extremity/Trunk Assessment   Upper Extremity Assessment: Generalized weakness           Lower Extremity Assessment: RLE deficits/detail;LLE deficits/detail;Difficult to assess due to impaired cognition RLE Deficits / Details: grossly 3+/5; pt refuses hands-on assessment LLE Deficits / Details: grossly 3+/5; pt refuses hands-on assessment  Cervical / Trunk Assessment: Normal  Communication   Communication: No difficulties  Cognition Arousal/Alertness: Awake/alert Behavior During Therapy: Anxious;Impulsive Overall Cognitive Status: No family/caregiver present to determine baseline cognitive functioning Area of Impairment: Attention;Memory;Safety/judgement;Problem solving   Current Attention Level: Selective (with cues - she frequently self distracts ) Memory: Decreased short-term memory   Safety/Judgement: Decreased awareness of safety   Problem Solving: Requires verbal cues (does not allow tactile cues) General Comments: Pt very tangential.  frequently self distracts requiring mod A to direct back to topic.  She contradicts self frequently in conversation, and demonstrates decreased judgement.  Unsure of pt's baseline status     General Comments General comments (skin integrity, edema, etc.): Pt is very particular about how and what she will do. She wants therapist to call her to make appointments to see her.     Exercises General Exercises - Lower Extremity Quad Sets: AROM;Both;Other reps  (comment) (3 and she stopped) Short Arc Quad: AROM;Both;5 reps Heel Slides: AROM;Both;Other reps (comment) (2 reps and she stopped) Other Exercises Other Exercises: Educated patient on bridging exercise. She initially claimed she knew how to do it and refused. When pressed, she demonstrated incorrect method. Explained (with many interruptions) the muscles targeting and reason for this exercise and pt refused to allow PT to assist her, facilitate proper bridging.  Other Exercises: Attempted use of bolster under knees for SAQ and hip extension/modified bridging with patient refusing to place bolster under her LLE. "That's the one that has the stent. I can't do that. Performed SAQ with foot of bed elevated.      Assessment/Plan    PT Assessment Patient needs continued PT services  PT Diagnosis Difficulty walking;Generalized weakness   PT Problem List Decreased strength;Decreased activity tolerance;Decreased balance;Decreased mobility;Decreased knowledge of use of DME;Decreased safety awareness;Pain;Decreased cognition;Impaired sensation;Decreased skin integrity  PT Treatment Interventions DME instruction;Gait training;Functional mobility training;Therapeutic activities;Therapeutic exercise;Balance training;Patient/family education;Cognitive remediation   PT Goals (Current goals can be found in the Care Plan section) Acute Rehab PT Goals Patient Stated Goal: to go home - pt adamantly refusing SNF  PT Goal Formulation: With patient Time For Goal Achievement: 03/15/16 Potential to Achieve Goals: Fair    Frequency Min 3X/week   Barriers to discharge Decreased caregiver support alone for 1.5-3 hours per day (minimum)    Co-evaluation PT/OT/SLP Co-Evaluation/Treatment: Yes Reason for  Co-Treatment: Necessary to address cognition/behavior during functional activity;For patient/therapist safety PT goals addressed during session: Mobility/safety with mobility;Strengthening/ROM;Proper use of DME OT  goals addressed during session: ADL's and self-care       End of Session   Activity Tolerance: Patient limited by pain;Other (comment) (Agitation) Patient left: in bed;with call bell/phone within reach           Time: 1131-1225 PT Time Calculation (min) (ACUTE ONLY): 54 min   Charges:   PT Evaluation $PT Eval Moderate Complexity: 1 Procedure PT Treatments $Therapeutic Exercise: 8-22 mins   PT G Codes:        Zafira Munos 2016-03-31, 4:01 PM Pager [phone]

## 2016-03-01 NOTE — Progress Notes (Signed)
Pt CBG 69, pt stated own intervention plan, requested cranberry juice, will recheck after POs taken

## 2016-03-01 NOTE — Evaluation (Signed)
Occupational Therapy Evaluation Patient Details Name: [name] MRN: [id number] DOB: 1961/08/03 Today's Date: 03/01/2016    History of Present Illness 55 y.o. female with multiple comorbidities including type 2 diabetes mellitus, hypothyroidism, severe protein calorie malnutrition, history of pulmonary embolism, presented to the emergency department 4/30 with complaints of Left lower extremity pain associated with edema and erythema x 2 days. Upon presentation to the ER [name] was found to have 10/10 pain to that leg with decreased mobility. [name] had bedside ultrasound in ED which demonstrated occlusive DVT throughout the L lower extremity and [name] was started on IV heparin. 5/2 [name] went to IR for venous catheter placement for thrombolysis, post-procedure [name] was transferred to ICU for continued lytics.   Clinical Impression   [name] admitted with above. [name] demonstrates the below listed deficits and will benefit from continued OT to maximize safety and independence with [name].  Eval limited by [name]'s self limiting behaviors.  [name] is very particular about what [name] will do and when [name] will do it, and is insistent that therapists make appointments with her.  Feel [name] needs 24 hour assist at discharge, which [name] doesn't have, but [name] is adamantly refusing SNF.   Will follow.       Follow Up Recommendations  Home health OT;Supervision/Assistance - 24 hour  - strongly recommend SNF as home situation does not sound optimal, however, [name] is adamantly refusing    Equipment Recommendations  3 in 1 bedside comode    Recommendations for Other Services       Precautions / Restrictions Precautions Precautions: Fall Precaution Comments: knees buckle      Mobility Bed Mobility   Bed Mobility: Rolling Rolling: Supervision         General bed mobility comments: [name] refused to move to EOB   Transfers                 General transfer comment: [name] refused all OOB activity despite max  encouragement     Balance                                            ADL Overall ADL's : Needs assistance/impaired Eating/Feeding: Independent   Grooming: Wash/dry hands;Wash/dry face;Oral care;Brushing hair;Set up;Bed level   Upper Body Bathing: Set up;Bed level   Lower Body Bathing: Minimal assistance;Bed level   Upper Body Dressing : Set up;Bed level   Lower Body Dressing: Minimal assistance;Bed level   Toilet Transfer: Total assistance Toilet Transfer Details (indicate cue type and reason): [name] would not attempt  Toileting- Clothing Manipulation and Hygiene: Moderate assistance;Bed level               Vision     Perception     Praxis      Pertinent Vitals/Pain Pain Assessment: Faces Faces Pain Scale: Hurts little more Pain Location: Lt LE and back  Pain Descriptors / Indicators: Burning Pain Intervention(s): Monitored during session;RN gave pain meds during session     Hand Dominance Left   Extremity/Trunk Assessment Upper Extremity Assessment Upper Extremity Assessment: Generalized weakness   Lower Extremity Assessment Lower Extremity Assessment: Defer to [name] evaluation   Cervical / Trunk Assessment Cervical / Trunk Assessment: Normal   Communication Communication Communication: No difficulties   Cognition Arousal/Alertness: Awake/alert Behavior During Therapy: Anxious Overall Cognitive Status: No family/caregiver present to determine baseline cognitive functioning Area of  Impairment: Attention;Memory;Safety/judgement;Problem solving   Current Attention Level: Selective (with cues - [name] frequently self distracts ) Memory: Decreased short-term memory   Safety/Judgement: Decreased awareness of safety   Problem Solving: Requires verbal cues General Comments: [name] very tangential.  frequently self distracts requiring mod A to direct back to topic.  [name] contradicts self frequently in conversation, and demonstrates decreased  judgement.  Unsure of [name]'s baseline status    General Comments       Exercises       Shoulder Instructions      Home Living Family/patient expects to be discharged to:: Private residence Living Arrangements: Children (son) Available Help at Discharge: Family;Personal care attendant;Available PRN/intermittently Type of Home: Apartment Home Access: Level entry     Home Layout: One level     Bathroom Shower/Tub: Other (comment) (does sponge bath with aide)   Bathroom Toilet: Standard Bathroom Accessibility: Yes   Home Equipment: Walker - 2 wheels;Cane - single point;Hospital bed;Tub bench (wears depends)   Additional Comments: [name] has aide for hours in am and four hours in pm 7 days/week.  Her teenage son assists in between visits, but [name] reports he is unreliable       Prior Functioning/Environment Level of Independence: Needs assistance  Gait / Transfers Assistance Needed: [name] said [name] walked with RW, but fall occured when son was assisting her transfer into bed.  Recent hospital stay and [name] highly recommended SNF at that time and [name] refused. ADL's / Homemaking Assistance Needed: [name] reports [name] performs most of her ADLs. [name] reports [name] wears diapers at home, and has incontinent episodes. [name] providing inconsistencies throughout evaluation.  [name] initially states [name] needs her diapers due to incontinent episodes, but later states [name] always ambulates to the bathroom    Comments: [name] reports [name] uses SPC and furniture walking     OT Diagnosis: Generalized weakness;Cognitive deficits   OT Problem List: Decreased strength;Decreased activity tolerance;Impaired balance (sitting and/or standing);Decreased safety awareness;Decreased knowledge of use of DME or AE;Cardiopulmonary status limiting activity;Pain   OT Treatment/Interventions: Self-care/ADL training;Therapeutic exercise;DME and/or AE instruction;Therapeutic activities;Patient/family education;Balance training;Cognitive  remediation/compensation    OT Goals(Current goals can be found in the care plan section) Acute Rehab OT Goals Patient Stated Goal: to go home - [name] adamantly refusing SNF  OT Goal Formulation: With patient Time For Goal Achievement: 03/15/16 Potential to Achieve Goals: Good ADL Goals [name] Will Perform Lower Body Bathing: with min assist;sit to/from stand [name] Will Perform Lower Body Dressing: with min assist;sit to/from stand [name] Will Transfer to Toilet: squat pivot transfer;ambulating;regular height toilet;bedside commode;grab bars [name] Will Perform Toileting - Clothing Manipulation and hygiene: with min assist;sit to/from stand [name]/caregiver will Perform Home Exercise Program: Increased strength;Right Upper extremity;Left upper extremity;With Supervision;With written HEP provided  OT Frequency: Min 2X/week   Barriers to D/C: Decreased caregiver support          Co-evaluation [name]/OT/SLP Co-Evaluation/Treatment: Yes Reason for Co-Treatment: For patient/therapist safety;Complexity of the patient's impairments (multi-system involvement)   OT goals addressed during session: ADL's and self-care      End of Session Nurse Communication: Mobility status  Activity Tolerance: Treatment limited secondary to medical complications (Comment) (self limiting behaviors ) Patient left: in bed;with call bell/phone within reach   Time: 8295-6213 OT Time Calculation (min): 35 min Charges:  OT General Charges $OT Visit: 1 Procedure OT Evaluation $OT Eval Moderate Complexity: 1 Procedure G-Codes:    Shakera Ebrahimi M 03-30-16, 1:42 PM

## 2016-03-02 ENCOUNTER — Inpatient Hospital Stay (HOSPITAL_COMMUNITY): Payer: Medicare Other

## 2016-03-02 DIAGNOSIS — E43 Unspecified severe protein-calorie malnutrition: Secondary | ICD-10-CM

## 2016-03-02 DIAGNOSIS — I871 Compression of vein: Secondary | ICD-10-CM

## 2016-03-02 DIAGNOSIS — E118 Type 2 diabetes mellitus with unspecified complications: Secondary | ICD-10-CM

## 2016-03-02 DIAGNOSIS — R21 Rash and other nonspecific skin eruption: Secondary | ICD-10-CM

## 2016-03-02 LAB — CBC
HCT: 27 % — ABNORMAL LOW (ref 36.0–46.0)
HEMOGLOBIN: 8.4 g/dL — AB (ref 12.0–15.0)
MCH: 31.6 pg (ref 26.0–34.0)
MCHC: 31.1 g/dL (ref 30.0–36.0)
MCV: 101.5 fL — ABNORMAL HIGH (ref 78.0–100.0)
PLATELETS: 102 10*3/uL — AB (ref 150–400)
RBC: 2.66 MIL/uL — ABNORMAL LOW (ref 3.87–5.11)
RDW: 18.8 % — AB (ref 11.5–15.5)
WBC: 7.8 10*3/uL (ref 4.0–10.5)

## 2016-03-02 LAB — CK: Total CK: 346 U/L — ABNORMAL HIGH (ref 38–234)

## 2016-03-02 LAB — IRON AND TIBC
Iron: 47 ug/dL (ref 28–170)
SATURATION RATIOS: 42 % — AB (ref 10.4–31.8)
TIBC: 112 ug/dL — AB (ref 250–450)
UIBC: 65 ug/dL

## 2016-03-02 LAB — BASIC METABOLIC PANEL
ANION GAP: 6 (ref 5–15)
BUN: <5 mg/dL — ABNORMAL LOW (ref 6–20)
CALCIUM: 6.6 mg/dL — AB (ref 8.9–10.3)
CO2: 16 mmol/L — ABNORMAL LOW (ref 22–32)
Chloride: 121 mmol/L — ABNORMAL HIGH (ref 101–111)
Creatinine, Ser: 0.77 mg/dL (ref 0.44–1.00)
GLUCOSE: 96 mg/dL (ref 65–99)
Potassium: 3.5 mmol/L (ref 3.5–5.1)
SODIUM: 143 mmol/L (ref 135–145)

## 2016-03-02 LAB — FOLATE: FOLATE: 16 ng/mL (ref >5.9)

## 2016-03-02 LAB — GLUCOSE, CAPILLARY
GLUCOSE-CAPILLARY: 80 mg/dL (ref 65–99)
Glucose-Capillary: 93 mg/dL (ref 65–99)
Glucose-Capillary: 77 mg/dL (ref 65–99)
Glucose-Capillary: 114 mg/dL — ABNORMAL HIGH (ref 65–99)

## 2016-03-02 LAB — RETICULOCYTES
RBC.: 2.83 MIL/uL — ABNORMAL LOW (ref 3.87–5.11)
Retic Count, Absolute: 107.5 10*3/uL (ref 19.0–186.0)
Retic Ct Pct: 3.8 % — ABNORMAL HIGH (ref 0.4–3.1)

## 2016-03-02 LAB — LACTIC ACID, PLASMA
LACTIC ACID, VENOUS: 4.2 mmol/L — AB (ref 0.5–2.0)
LACTIC ACID, VENOUS: 4.1 mmol/L — AB (ref 0.5–2.0)

## 2016-03-02 LAB — FERRITIN: Ferritin: 219 ng/mL (ref 11–307)

## 2016-03-02 LAB — LACTATE DEHYDROGENASE: LDH: 233 U/L — AB (ref 98–192)

## 2016-03-02 LAB — VITAMIN B12: Vitamin B-12: 467 pg/mL (ref 180–914)

## 2016-03-02 MED ORDER — SODIUM CHLORIDE 0.9 % IV BOLUS (SEPSIS)
1000.0000 mL | Freq: Once | INTRAVENOUS | Status: AC
  Administered 2016-03-02: 1000 mL via INTRAVENOUS

## 2016-03-02 MED ORDER — DEXTROSE-NACL 5-0.45 % IV SOLN
INTRAVENOUS | Status: DC
  Administered 2016-03-02 – 2016-03-03 (×2): via INTRAVENOUS

## 2016-03-02 MED ORDER — ROSUVASTATIN CALCIUM 20 MG PO TABS
20.0000 mg | ORAL_TABLET | Freq: Every day | ORAL | Status: DC
  Administered 2016-03-02 – 2016-03-05 (×4): 20 mg via ORAL
  Filled 2016-03-02 (×6): qty 1

## 2016-03-02 MED ORDER — DIPHENHYDRAMINE HCL 25 MG PO CAPS
50.0000 mg | ORAL_CAPSULE | Freq: Once | ORAL | Status: AC
  Administered 2016-03-03: 50 mg via ORAL
  Filled 2016-03-02: qty 2

## 2016-03-02 MED ORDER — TOLNAFTATE 1 % EX POWD
Freq: Two times a day (BID) | CUTANEOUS | Status: DC
  Filled 2016-03-02: qty 45

## 2016-03-02 MED ORDER — PANTOPRAZOLE SODIUM 40 MG PO TBEC: 40.0000 mg | DELAYED_RELEASE_TABLET | Freq: Every day | ORAL | Status: DC

## 2016-03-02 MED ORDER — MICONAZOLE NITRATE POWD
Freq: Two times a day (BID) | Status: DC
  Administered 2016-03-02 – 2016-03-06 (×7): via TOPICAL

## 2016-03-02 MED ORDER — OXYCODONE HCL 5 MG PO TABS
5.0000 mg | ORAL_TABLET | ORAL | Status: DC | PRN
  Administered 2016-03-02 – 2016-03-06 (×20): 5 mg via ORAL
  Filled 2016-03-02 (×20): qty 1

## 2016-03-02 MED ORDER — NYSTATIN 100000 UNIT/GM EX POWD: Freq: Two times a day (BID) | CUTANEOUS | Status: DC

## 2016-03-02 MED ORDER — PREDNISONE 50 MG PO TABS
50.0000 mg | ORAL_TABLET | Freq: Four times a day (QID) | ORAL | Status: AC
  Administered 2016-03-02 – 2016-03-03 (×2): 50 mg via ORAL
  Filled 2016-03-02 (×3): qty 1

## 2016-03-02 NOTE — Progress Notes (Signed)
Initial Nutrition Assessment  DOCUMENTATION CODES:   Severe malnutrition in context of chronic illness, Underweight  INTERVENTION:  Continue Boost Breeze po TID, each supplement provides 250 kcal and 9 grams of protein.  Encourage adequate PO intake.  NUTRITION DIAGNOSIS:   Malnutrition related to chronic illness as evidenced by percent weight loss, severe depletion of muscle mass, energy intake < or equal to 75% for > or equal to 1 month.  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 Pt went to see her PCP and was found to be pale with HR in 120's. She was sent to ER for evaluation. Pt with sepsis/UTI.  Pt reports a lack of appetite due to abdominal discomfort. He reports not eating well over the past 1 month PTA. She reports she has only been able to consume lemon slices, oranges, and yogurt daily. Pt does reports consuming water and tea. When asked about sources of protein in her diet, pt reports she consumes chicken maybe once a week. Current meal completion has been varied from 0-100%. Pt currently has Boost Breeze ordered and has been consuming them. RD to continue with current orders. Per Epic weight records, pt with a 8.6% weight loss in 1 month. Pt was encouraged to eat her food at meals and to drink her supplements.   Nutrition-Focused physical exam completed. Findings are mild to moderate fat  depletion, severe muscle depletion, and mild edema.   Labs and medications reviewed.   Diet Order:  Diet regular Room service appropriate?: Yes; Fluid consistency:: Thin  Skin:  Reviewed, no issues  Last BM:  6/2  Height:   Ht Readings from Last 1 Encounters:  03/01/16 5\' 5"  (1.651 m)    Weight:   Wt Readings from Last 1 Encounters:  03/01/16 106 lb 4.8 oz (48.217 kg)    Ideal Body Weight:  56.8 kg  BMI:  Body mass index is 17.69 kg/(m^2).  Estimated Nutritional Needs:   Kcal:  1600-1800  Protein:  75-85 grams  Fluid:  1.6 - 1.8 L/day  EDUCATION NEEDS:   No education needs identified at this time  Roslyn Smiling, MS, RD, LDN Pager # [phone] After hours/ weekend pager # [phone]

## 2016-03-02 NOTE — Progress Notes (Signed)
PTOT Cancellation Note  Patient Details Name: [name] MRN: [id number] DOB: 12-14-1960   Cancelled Treatment:    Reason Eval/Treat Not Completed: Phone pt to make an appointment with her for PT/OT, as she is insistent that therapies call her ahead of time to schedule treatment time with her. She states that today is not a good day because she might discharge home.  Will continue attempts.    Angelene Giovanni [place], OTR/L [phone]  03/02/2016, 10:32 AM

## 2016-03-02 NOTE — Care Management Important Message (Signed)
Important Message  Patient Details  Name: [name] MRN: [id number] Date of Birth: Jun 16, 1961   Medicare Important Message Given:  Yes    Bernadette Hoit 03/02/2016, 11:06 AM

## 2016-03-02 NOTE — Progress Notes (Signed)
CRITICAL VALUE ALERT  Critical value received:  Lactic acid 4.2  Date of notification:  03/02/16  Time of notification:  12:15  Critical value read back:Yes.    Nurse who received alert:  Dionisio David   MD notified (1st page): Lynden Oxford  Time of first page:  12:17  MD notified (2nd page):  Time of second page:  Responding MD:  Lynden Oxford  Time MD responded:  12:20 pm

## 2016-03-02 NOTE — Progress Notes (Signed)
[facility] Progress Note  Patient: [name] SJG:[id number]   PCP: Wenda Low, MD DOB: [birth year]   DOA: 02/28/2016   DOS: 03/02/2016   Date of Service: the patient was seen and examined on 03/02/2016  Subjective: Patient is a mild abdominal pain also complains of abdominal cramps with acid reflux. No nausea no vomiting but minimal oral intake. Nutrition: Minimal oral intake, tolerating oral diet  Brief hospital course: Patient was admitted on 02/28/2016, with complaint of Tachycardia, was found to have Sepsis due to UTI. Currently further plan is Continue further workup of lactic acidosis.  Assessment and Plan: 1. UTI (lower urinary tract infection) Sepsis due to UTI. Met criteria on admission. Currently hemodynamically stable. Urine culture grew multiple organisms. Blood cultures remain negative. We will continue with antibiotics at present.  2.Lactic acidosis. The patient complains of abdominal pain.  Lactic acid on admission was 12.71. Improving to 4. But not responding to IV fluids. With this I would get a CT scan of her abdomen with contrast to rule out any acute intra-abdominal pathology as a cause of patient's lactic acidosis. Since the patient is not appearing to be toxic or hemodynamically unstable we will attempt CT scan in the morning after completing the treatment for IV contrast allergy. Patient will be kept on clear liquid diet until the CT scan is resulted.  3. Chronic diarrhea. Patient mentions that she has chronic diarrhea. A no episodes of bowel movement here in the hospital. We will continue to monitor.  4. 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 Continue Apixaban.  5. Diabetes mellitus type 2. Continue sliding scale insulin.  6.Hypothyroidism. TSHstable. Continue Synthroid  7.Rash on the thigh. Barrier cream, wound care  consult, antifungal  Powder.  8.Physical deconditioning. PTOT recommends SNF with the patient is refusing and would like to go to home. We'll continue to monitor.  9. Chronic pain. Patient is on Vicodin but due to Maxing out on Tylenol patient will be switched back to [facility].  Pain management: When necessary OxyIR Activity: SNF per-physical therapy Bowel regimen: last BM 03/02/2016 Diet: Clear liquid diet DVT Prophylaxis: Therapeutic anticoagulation  Advance goals of care discussion: Full code  Family Communication: no family was present at bedside, at the time of interview.   Disposition:  Discharge to home, home health Expected discharge date: 03/04/2016  Consultants: none Procedures: none  Antibiotics: Anti-infectives    Start     Dose/Rate Route Frequency Ordered Stop   03/01/16 1500  levofloxacin (LEVAQUIN) IVPB 750 mg  Status:  Discontinued     750 mg 100 mL/hr over 90 Minutes Intravenous Every 48 hours 02/28/16 1507 02/29/16 0857   03/01/16 1000  levofloxacin (LEVAQUIN) tablet 750 mg     750 mg Oral Daily 02/29/16 0903     02/29/16 1000  levofloxacin (LEVAQUIN) tablet 750 mg  Status:  Discontinued     750 mg Oral Daily 02/29/16 0858 02/29/16 0903   02/28/16 1745  fluconazole (DIFLUCAN) tablet 50 mg     50 mg Oral Daily 02/28/16 1743     02/28/16 1743  levofloxacin (LEVAQUIN) IVPB 500 mg  Status:  Discontinued     500 mg 100 mL/hr over 60 Minutes Intravenous Every 24 hours 02/28/16 1743 02/28/16 1944   02/28/16 1500  levofloxacin (LEVAQUIN) IVPB 750 mg     750 mg 100 mL/hr over 90 Minutes Intravenous  Once 02/28/16  1449 02/28/16 1822   02/28/16 1500  aztreonam (AZACTAM) 2 g in dextrose 5 % 50 mL IVPB  Status:  Discontinued     2 g 100 mL/hr over 30 Minutes Intravenous  Once 02/28/16 1449 02/28/16 1507        Intake/Output Summary (Last 24 hours) at 03/02/16 1902 Last data filed at 03/02/16 1745  Gross per 24 hour  Intake 1711.25 ml  Output    300 ml  Net  1411.25 ml   Filed Weights   02/28/16 1500 03/01/16 0427 03/01/16 2058  Weight: 52.8 kg (116 lb 6.5 oz) 49.034 kg (108 lb 1.6 oz) 48.217 kg (106 lb 4.8 oz)    Objective: Physical Exam: Filed Vitals:   03/01/16 2058 03/02/16 0439 03/02/16 0848 03/02/16 1610  BP: 105/72 105/81 135/85 131/89  Pulse: 89 105 51 84  Temp: 98.1 F (36.7 C) 98 F (36.7 C) 97.8 F (36.6 C) 98.7 F (37.1 C)  TempSrc: Oral Oral Oral Oral  Resp: 20 19 18 17   Height: 5' 5"  (1.651 m)     Weight: 48.217 kg (106 lb 4.8 oz)     SpO2: 100% 100% 93% 98%    General: Alert, Awake and Oriented to Time, Place and Person. Appear in mild distress Eyes: PERRL, Conjunctiva normal ENT: Oral Mucosa clear moist. Neck: no JVD, no Abnormal Mass Or lumps Cardiovascular: S1 and S2 Present, aortic systolic Murmur, Peripheral Pulses Present Respiratory: Bilateral Air entry equal and Decreased, Clear to Auscultation, no Crackles, no wheezes Abdomen: Bowel Sound present, Soft and mild tenderness Skin: no redness, no Rash  Extremities: no Pedal edema, no calf tenderness Neurologic: Grossly no focal neuro deficit. Bilaterally Equal motor strength  Data Reviewed: CBC:  Recent Labs Lab 02/28/16 1315 02/28/16 2320 03/01/16 0543 03/02/16 0430  WBC 15.7* 10.1 9.9 7.8  NEUTROABS 13.3* 7.2  --   --   HGB 11.6* 9.3* 9.9* 8.4*  HCT 35.8* 29.1* 31.4* 27.0*  MCV 96.5 97.3 99.7 101.5*  PLT 172 128* 106* 875*   Basic Metabolic Panel:  Recent Labs Lab 02/28/16 1315 02/28/16 2320 03/01/16 0754 03/02/16 0430  NA 139 138 142 143  K 3.6 3.4* 3.3* 3.5  CL 95* 104 118* 121*  CO2 21* 19* 17* 16*  GLUCOSE 105* 98 68 96  BUN <5* <5* <5* <5*  CREATININE 1.04* 1.01* 0.76 0.77  CALCIUM 8.0* 6.8* 6.5* 6.6*    Liver Function Tests:  Recent Labs Lab 02/28/16 1315 02/28/16 2320  AST 139* 114*  ALT 45 36  ALKPHOS 192* 155*  BILITOT 0.9 0.7  PROT 5.9* 4.5*  ALBUMIN 2.4* 1.8*   No results for input(s): LIPASE, AMYLASE in  the last 168 hours. No results for input(s): AMMONIA in the last 168 hours. Coagulation Profile: No results for input(s): INR, PROTIME in the last 168 hours. Cardiac Enzymes:  Recent Labs Lab 02/28/16 1315 03/02/16 1109  CKTOTAL  --  346*  TROPONINI <0.03  --    BNP (last 3 results) No results for input(s): PROBNP in the last 8760 hours.  CBG:  Recent Labs Lab 03/01/16 1701 03/01/16 2232 03/02/16 0845 03/02/16 1145 03/02/16 1608  GLUCAP 90 116* 80 93 77    Studies: No results found.   Scheduled Meds: . apixaban  5 mg Oral BID  . [START ON 03/03/2016] diphenhydrAMINE  50 mg Oral Once  . feeding supplement  1 Container Oral TID BM  . fluconazole  50 mg Oral Daily  . folic acid  1 mg Oral Daily  . gabapentin  100 mg Oral BID  . insulin aspart  0-5 Units Subcutaneous QHS  . insulin aspart  0-9 Units Subcutaneous TID WC  . levofloxacin  750 mg Oral Daily  . levothyroxine  25 mcg Oral QAC breakfast  . miconazole nitrate   Topical BID  . predniSONE  50 mg Oral Q6H  . rosuvastatin  20 mg Oral q1800  . saccharomyces boulardii  250 mg Oral BID  . sodium chloride flush  3 mL Intravenous Q12H  . thiamine  100 mg Oral Daily  . venlafaxine  25 mg Oral BID   Continuous Infusions: . dextrose 5 % and 0.45% NaCl 75 mL/hr at 03/02/16 1739   PRN Meds: acetaminophen **OR** acetaminophen, diazepam, menthol-cetylpyridinium, ondansetron (ZOFRAN) IV, ondansetron, oxyCODONE, phenol, technetium TC 24M diethylenetriame-pentaacetic acid, traZODone  Time spent: 30 minutes  Author: Berle Mull, MD [facility] Pager: [phone] 03/02/2016 7:02 PM  If 7PM-7AM, please contact night-coverage at [email or web address], password [facility]

## 2016-03-02 NOTE — Progress Notes (Signed)
Patient has CT scan ordered with contrast of the abdomen, patient stated allergy to contrast dye, T. CallahanNP was paged to clarify order of CT scan tonight. Pt. Had abdominal x-ray done portable.  With order to hold CT tonight  and follow up with the rounding doctors about the scan. Will continue to monitor patient.

## 2016-03-02 NOTE — Progress Notes (Signed)
CRITICAL VALUE ALERT  Critical value received:  Lactic Acid = 4.1  Date of notification:  03-02-2016  Time of notification:  15:02  Critical value read back:Yes.    Nurse who received alert:  Meridee Score, RN  MD notified (1st page):  PAtel   Time of first page:  15:03  MD notified (2nd page):  Time of second page:  Responding MD:  Allena Katz  Time MD responded:  15:40

## 2016-03-03 ENCOUNTER — Encounter (HOSPITAL_COMMUNITY): Payer: Self-pay | Admitting: Radiology

## 2016-03-03 ENCOUNTER — Inpatient Hospital Stay (HOSPITAL_COMMUNITY): Payer: Medicare Other

## 2016-03-03 DIAGNOSIS — K760 Fatty (change of) liver, not elsewhere classified: Secondary | ICD-10-CM

## 2016-03-03 LAB — GLUCOSE, CAPILLARY
GLUCOSE-CAPILLARY: 225 mg/dL — AB (ref 65–99)
GLUCOSE-CAPILLARY: 125 mg/dL — AB (ref 65–99)
Glucose-Capillary: 287 mg/dL — ABNORMAL HIGH (ref 65–99)
Glucose-Capillary: 170 mg/dL — ABNORMAL HIGH (ref 65–99)

## 2016-03-03 LAB — CBC WITH DIFFERENTIAL/PLATELET
BASOS PCT: 0 %
Basophils Absolute: 0 10*3/uL (ref 0.0–0.1)
EOS ABS: 0 10*3/uL (ref 0.0–0.7)
Eosinophils Relative: 0 %
HCT: 32 % — ABNORMAL LOW (ref 36.0–46.0)
HEMOGLOBIN: 10.1 g/dL — AB (ref 12.0–15.0)
Lymphocytes Relative: 8 %
Lymphs Abs: 0.6 10*3/uL — ABNORMAL LOW (ref 0.7–4.0)
MCH: 31.2 pg (ref 26.0–34.0)
MCHC: 31.6 g/dL (ref 30.0–36.0)
MCV: 98.8 fL (ref 78.0–100.0)
Monocytes Absolute: 0 10*3/uL — ABNORMAL LOW (ref 0.1–1.0)
Monocytes Relative: 1 %
NEUTROS PCT: 91 %
Neutro Abs: 7.3 10*3/uL (ref 1.7–7.7)
PLATELETS: 126 10*3/uL — AB (ref 150–400)
RBC: 3.24 MIL/uL — AB (ref 3.87–5.11)
RDW: 19.1 % — AB (ref 11.5–15.5)
WBC: 8 10*3/uL (ref 4.0–10.5)

## 2016-03-03 LAB — LACTIC ACID, PLASMA: Lactic Acid, Venous: 6.9 mmol/L (ref 0.5–2.0)

## 2016-03-03 LAB — PROTIME-INR
INR: 1.96 — AB (ref 0.00–1.49)
PROTHROMBIN TIME: 22.3 s — AB (ref 11.6–15.2)

## 2016-03-03 LAB — COMPREHENSIVE METABOLIC PANEL
ALT: 35 U/L (ref 14–54)
AST: 84 U/L — ABNORMAL HIGH (ref 15–41)
Albumin: 1.9 g/dL — ABNORMAL LOW (ref 3.5–5.0)
Alkaline Phosphatase: 138 U/L — ABNORMAL HIGH (ref 38–126)
Anion gap: 14 (ref 5–15)
BILIRUBIN TOTAL: 0.2 mg/dL — AB (ref 0.3–1.2)
BUN: <5 mg/dL — ABNORMAL LOW (ref 6–20)
CALCIUM: 6.9 mg/dL — AB (ref 8.9–10.3)
CO2: 12 mmol/L — ABNORMAL LOW (ref 22–32)
CREATININE: 0.94 mg/dL (ref 0.44–1.00)
Chloride: 114 mmol/L — ABNORMAL HIGH (ref 101–111)
Glucose, Bld: 277 mg/dL — ABNORMAL HIGH (ref 65–99)
Potassium: 3.8 mmol/L (ref 3.5–5.1)
Sodium: 140 mmol/L (ref 135–145)
TOTAL PROTEIN: 4.8 g/dL — AB (ref 6.5–8.1)

## 2016-03-03 LAB — MAGNESIUM: Magnesium: 1 mg/dL — ABNORMAL LOW (ref 1.7–2.4)

## 2016-03-03 LAB — LIPASE, BLOOD: Lipase: 13 U/L (ref 11–51)

## 2016-03-03 MED ORDER — IOPAMIDOL (ISOVUE-300) INJECTION 61%
INTRAVENOUS | Status: AC
  Administered 2016-03-03: 75 mL
  Filled 2016-03-03: qty 75

## 2016-03-03 MED ORDER — WHITE PETROLATUM GEL
Status: AC
  Administered 2016-03-03: 12:00:00
  Filled 2016-03-03: qty 1

## 2016-03-03 MED ORDER — SODIUM CHLORIDE 0.9 % IV BOLUS (SEPSIS)
500.0000 mL | Freq: Once | INTRAVENOUS | Status: AC
  Administered 2016-03-03: 500 mL via INTRAVENOUS

## 2016-03-03 MED ORDER — PREDNISONE 50 MG PO TABS
50.0000 mg | ORAL_TABLET | Freq: Four times a day (QID) | ORAL | Status: AC
  Administered 2016-03-03: 50 mg via ORAL
  Filled 2016-03-03: qty 1

## 2016-03-03 MED ORDER — CALCIUM CARBONATE ANTACID 500 MG PO CHEW
1.0000 | CHEWABLE_TABLET | Freq: Three times a day (TID) | ORAL | Status: DC
  Administered 2016-03-04 – 2016-03-06 (×7): 200 mg via ORAL
  Filled 2016-03-03 (×7): qty 1

## 2016-03-03 MED ORDER — DIATRIZOATE MEGLUMINE & SODIUM 66-10 % PO SOLN
ORAL | Status: AC
  Filled 2016-03-03: qty 30

## 2016-03-03 NOTE — Progress Notes (Signed)
[facility] Progress Note  Patient: [name] VQM:[id number]   PCP: Wenda Low, MD DOB: [birth year]   DOA: 02/28/2016   DOS: 03/03/2016   Date of Service: the patient was seen and examined on 03/03/2016  Subjective: The patient mentions her pain is better. Denies any nausea or vomiting. No diarrhea. Nutrition: Minimal oral intake, tolerating oral diet  Brief hospital course: Patient was admitted on 02/28/2016, with complaint of Tachycardia, was found to have Sepsis due to UTI. Currently further plan is Continue further workup of lactic acidosis.  Assessment and Plan: 1. UTI (lower urinary tract infection) Sepsis due to UTI. Met criteria on admission. Currently hemodynamically stable. Urine culture grew multiple organisms. Blood cultures remain negative. Finish 5 days of Levaquin.  2.metabolic acidosis, high anion gap due to Lactic acidosis. Normal anion gap due to hyperchloremia  Lactic acid on admission was 12.71. Continues to remain elevated. CT abdomen does not reveal any acute abnormality. Advance the diet to soft diet. Check orthostatic. Continue D5 half normal saline. At present no clear etiology of lactic acid elevation is identified. Patient herself does not have any acute hemodynamic instability or complaint.  3. Chronic diarrhea. Patient mentions that she has chronic diarrhea. This can also contribute to normal anion gap acidosis although would not explain lactic acidosis. We will continue to monitor.  4. 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 Continue Apixaban.  5. Diabetes mellitus type 2. Continue sliding scale insulin.  6.Hypothyroidism. TSHstable. Continue Synthroid  7.Rash on the thigh. Barrier cream, wound care consult, antifungal  Powder.  8.Physical deconditioning. PTOT recommends SNF with the patient is refusing and would like  to go to home. We'll continue to monitor.  9. Chronic pain. Patient is on Vicodin but due to Maxing out on Tylenol patient will be switched to [facility].  10. Dilated CBD without any stone. Discuss with GI with minimally elevated to normal LFT and no evidence of stones on CT scan no further workup indicated at present.  Pain management: When necessary OxyIR Activity: SNF per-physical therapy Bowel regimen: last BM 03/02/2016 Diet: Soft diet DVT Prophylaxis: Therapeutic anticoagulation  Advance goals of care discussion: Full code  Family Communication: no family was present at bedside, at the time of interview.   Disposition:  Discharge to home, home health Expected discharge date: 03/04/2016  Consultants: none Procedures: none  Antibiotics: Anti-infectives    Start     Dose/Rate Route Frequency Ordered Stop   03/01/16 1500  levofloxacin (LEVAQUIN) IVPB 750 mg  Status:  Discontinued     750 mg 100 mL/hr over 90 Minutes Intravenous Every 48 hours 02/28/16 1507 02/29/16 0857   03/01/16 1000  levofloxacin (LEVAQUIN) tablet 750 mg  Status:  Discontinued     750 mg Oral Daily 02/29/16 0903 03/03/16 1537   02/29/16 1000  levofloxacin (LEVAQUIN) tablet 750 mg  Status:  Discontinued     750 mg Oral Daily 02/29/16 0858 02/29/16 0903   02/28/16 1745  fluconazole (DIFLUCAN) tablet 50 mg     50 mg Oral Daily 02/28/16 1743     02/28/16 1743  levofloxacin (LEVAQUIN) IVPB 500 mg  Status:  Discontinued     500 mg 100 mL/hr over 60 Minutes Intravenous Every 24 hours 02/28/16 1743 02/28/16 1944   02/28/16 1500  levofloxacin (LEVAQUIN) IVPB 750 mg     750 mg 100 mL/hr over 90 Minutes Intravenous  Once 02/28/16 1449 02/28/16 1822   02/28/16 1500  aztreonam (AZACTAM) 2 g in dextrose 5 % 50 mL IVPB  Status:  Discontinued     2 g 100 mL/hr over 30 Minutes Intravenous  Once 02/28/16 1449 02/28/16 1507        Intake/Output Summary (Last 24 hours) at 03/03/16 1543 Last data filed at 03/03/16  1400  Gross per 24 hour  Intake 1501.25 ml  Output    425 ml  Net 1076.25 ml   Filed Weights   02/28/16 1500 03/01/16 0427 03/01/16 2058  Weight: 52.8 kg (116 lb 6.5 oz) 49.034 kg (108 lb 1.6 oz) 48.217 kg (106 lb 4.8 oz)    Objective: Physical Exam: Filed Vitals:   03/02/16 2101 03/02/16 2110 03/03/16 0355 03/03/16 0818  BP: 141/94  131/100 112/83  Pulse: 104 102 104 100  Temp: 98.3 F (36.8 C)  98 F (36.7 C) 98.4 F (36.9 C)  TempSrc: Oral   Oral  Resp: _0 Height:      Weight:      SpO2: 100%  100% 100%    General: Alert, Awake and Oriented to Time, Place and Person. Appear in mild distress Eyes: PERRL, Conjunctiva normal ENT: Oral Mucosa clear moist. Neck: no JVD, no Abnormal Mass Or lumps Cardiovascular: S1 and S2 Present, aortic systolic Murmur, Peripheral Pulses Present Respiratory: Bilateral Air entry equal and Decreased, Clear to Auscultation, no Crackles, no wheezes Abdomen: Bowel Sound present, Soft and mild tenderness Skin: no redness, no Rash  Extremities: no Pedal edema, no calf tenderness Neurologic: Grossly no focal neuro deficit. Bilaterally Equal motor strength  Data Reviewed: CBC:  Recent Labs Lab 02/28/16 1315 02/28/16 2320 03/01/16 0543 03/02/16 0430 03/03/16 0410  WBC 15.7* 10.1 9.9 7.8 8.0  NEUTROABS 13.3* 7.2  --   --  7.3  HGB 11.6* 9.3* 9.9* 8.4* 10.1*  HCT 35.8* 29.1* 31.4* 27.0* 32.0*  MCV 96.5 97.3 99.7 101.5* 98.8  PLT 172 128* 106* 102* 350*   Basic Metabolic Panel:  Recent Labs Lab 02/28/16 1315 02/28/16 2320 03/01/16 0754 03/02/16 0430 03/03/16 0410  NA 139 138 142 143 140  K 3.6 3.4* 3.3* 3.5 3.8  CL 95* 104 118* 121* 114*  CO2 21* 19* 17* 16* 12*  GLUCOSE 105* 98 68 96 277*  BUN <5* <5* <5* <5* <5*  CREATININE 1.04* 1.01* 0.76 0.77 0.94  CALCIUM 8.0* 6.8* 6.5* 6.6* 6.9*  MG  --   --   --   --  1.0*    Liver Function Tests:  Recent Labs Lab 02/28/16 1315 02/28/16 2320 03/03/16 0410  AST 139*  114* 84*  ALT 45 36 35  ALKPHOS 192* 155* 138*  BILITOT 0.9 0.7 0.2*  PROT 5.9* 4.5* 4.8*  ALBUMIN 2.4* 1.8* 1.9*    Recent Labs Lab 03/03/16 1457  LIPASE 13   No results for input(s): AMMONIA in the last 168 hours. Coagulation Profile:  Recent Labs Lab 03/03/16 0410  INR 1.96*   Cardiac Enzymes:  Recent Labs Lab 02/28/16 1315 03/02/16 1109  CKTOTAL  --  346*  TROPONINI <0.03  --    BNP (last 3 results) No results for input(s): PROBNP in the last 8760 hours.  CBG:  Recent Labs Lab 03/02/16 1145 03/02/16 1608 03/02/16 2056 03/03/16 0817 03/03/16 1239  GLUCAP 93 77 114* 287* 225*    Studies: Ct Abdomen Pelvis W Contrast  03/03/2016  CLINICAL DATA:  Lactic acidosis. EXAM: CT ABDOMEN  AND PELVIS WITH CONTRAST TECHNIQUE: Multidetector CT imaging of the abdomen and pelvis was performed using the standard protocol following bolus administration of intravenous contrast. CONTRAST:  1 ISOVUE-300 IOPAMIDOL (ISOVUE-300) INJECTION 61% COMPARISON:  02/06/2016 FINDINGS: Lower chest: Bilateral pleural effusions are identified, left greater than right. Hepatobiliary: Hepatic steatosis is noted. Cyst along the dome of liver is again noted. Normal appearance of the gallbladder. Increase caliber of the common bile duct measures 1.5 cm. No filling defects identified. Pancreas: No mass, inflammatory changes, or other significant abnormality. Spleen: Within normal limits in size and appearance. Adrenals/Urinary Tract: The adrenal glands appear normal. Mild bilateral renal atrophy. 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 The urinary bladder appears normal. Stomach/Bowel: The stomach appears normal. No pathologic dilatation of the large or small bowel loops. The pelvic small bowel loops are mildly increased in caliber measuring up to 2 cm. There is no abnormal small bowel wall thickening or inflammation. No pneumatosis or evidence of bowel perforation. No pathologic dilatation of the colon.  No evidence for colonic pneumatosis or perforation. Vascular/Lymphatic: Aortic atherosclerosis noted. There is a metallic stent within the left common iliac vein. No enlarged upper abdominal lymph nodes. No enlarged pelvic or inguinal lymph nodes. Reproductive: The uterus is either surgically absent or atrophic. No adnexal mass identified. Other: There is no ascites or focal fluid collections within the abdomen or pelvis. Diffuse body wall edema identified. Musculoskeletal:  No suspicious bone abnormality identified. IMPRESSION: 1. No pathologic dilatation of the large or small bowel loops to suggest bowel obstruction. Additionally, there is no evidence for bowel perforation, abscess formation or pneumatosis. 2. Aortic atherosclerosis. 3. Hepatic steatosis. 4. Abnormal dilatation of the common bile duct which measures up to 15 mm. 5. Bilateral pleural effusions. Electronically Signed   By: Kerby Moors M.D.   On: 03/03/2016 14:06   Dg Abd Portable 1v  03/02/2016  CLINICAL DATA:  Abdominal pain EXAM: PORTABLE ABDOMEN - 1 VIEW COMPARISON:  None. FINDINGS: A vascular stent is seen in the region of the left common iliac artery. The bowel gas pattern is unremarkable with no evidence of obstruction. Elevation of the right hemidiaphragm is identified. No free air, portal venous gas, or pneumatosis. IMPRESSION: No acute abnormality Electronically Signed   By: Dorise Bullion III M.D   On: 03/02/2016 19:53     Scheduled Meds: . apixaban  5 mg Oral BID  . diatrizoate meglumine-sodium      . feeding supplement  1 Container Oral TID BM  . fluconazole  50 mg Oral Daily  . folic acid  1 mg Oral Daily  . gabapentin  100 mg Oral BID  . insulin aspart  0-5 Units Subcutaneous QHS  . insulin aspart  0-9 Units Subcutaneous TID WC  . levothyroxine  25 mcg Oral QAC breakfast  . miconazole nitrate   Topical BID  . rosuvastatin  20 mg Oral q1800  . saccharomyces boulardii  250 mg Oral BID  . sodium chloride flush  3 mL  Intravenous Q12H  . thiamine  100 mg Oral Daily  . venlafaxine  25 mg Oral BID   Continuous Infusions: . dextrose 5 % and 0.45% NaCl 75 mL/hr at 03/02/16 2300   PRN Meds: acetaminophen **OR** acetaminophen, diazepam, menthol-cetylpyridinium, ondansetron (ZOFRAN) IV, ondansetron, oxyCODONE, phenol, technetium TC 23M diethylenetriame-pentaacetic acid, traZODone  Time spent: 30 minutes  Author: Berle Mull, MD [facility] Pager: [phone] 03/03/2016 3:43 PM  If 7PM-7AM, please contact night-coverage at [email or web address], password [facility]

## 2016-03-04 DIAGNOSIS — E039 Hypothyroidism, unspecified: Secondary | ICD-10-CM

## 2016-03-04 DIAGNOSIS — E785 Hyperlipidemia, unspecified: Secondary | ICD-10-CM

## 2016-03-04 LAB — CBC WITH DIFFERENTIAL/PLATELET
BASOS PCT: 0 %
Basophils Absolute: 0 10*3/uL (ref 0.0–0.1)
EOS ABS: 0 10*3/uL (ref 0.0–0.7)
EOS PCT: 0 %
HCT: 29.3 % — ABNORMAL LOW (ref 36.0–46.0)
Hemoglobin: 9.4 g/dL — ABNORMAL LOW (ref 12.0–15.0)
Lymphocytes Relative: 16 %
Lymphs Abs: 2 10*3/uL (ref 0.7–4.0)
MCH: 32.2 pg (ref 26.0–34.0)
MCHC: 32.1 g/dL (ref 30.0–36.0)
MCV: 100.3 fL — AB (ref 78.0–100.0)
MONO ABS: 1 10*3/uL (ref 0.1–1.0)
Monocytes Relative: 8 %
NEUTROS ABS: 9.3 10*3/uL — AB (ref 1.7–7.7)
NEUTROS PCT: 76 %
PLATELETS: 157 10*3/uL (ref 150–400)
RBC: 2.92 MIL/uL — ABNORMAL LOW (ref 3.87–5.11)
RDW: 19.7 % — ABNORMAL HIGH (ref 11.5–15.5)
WBC: 12.3 10*3/uL — ABNORMAL HIGH (ref 4.0–10.5)

## 2016-03-04 LAB — BLOOD GAS, ARTERIAL
Acid-base deficit: 6.3 mmol/L — ABNORMAL HIGH (ref 0.0–2.0)
Bicarbonate: 17.8 mEq/L — ABNORMAL LOW (ref 20.0–24.0)
Drawn by: 44138
FIO2: 0.21
O2 SAT: 98.9 %
PATIENT TEMPERATURE: 98.1
PH ART: 7.388 (ref 7.350–7.450)
PO2 ART: 120 mmHg — AB (ref 80.0–100.0)
TCO2: 18.7 mmol/L (ref 0–100)
pCO2 arterial: 30 mmHg — ABNORMAL LOW (ref 35.0–45.0)

## 2016-03-04 LAB — COMPREHENSIVE METABOLIC PANEL
ALK PHOS: 113 U/L (ref 38–126)
ALT: 63 U/L — AB (ref 14–54)
ANION GAP: 12 (ref 5–15)
AST: 257 U/L — ABNORMAL HIGH (ref 15–41)
Albumin: 2 g/dL — ABNORMAL LOW (ref 3.5–5.0)
BUN: <5 mg/dL — ABNORMAL LOW (ref 6–20)
CALCIUM: 7 mg/dL — AB (ref 8.9–10.3)
CO2: 18 mmol/L — ABNORMAL LOW (ref 22–32)
CREATININE: 0.82 mg/dL (ref 0.44–1.00)
Chloride: 115 mmol/L — ABNORMAL HIGH (ref 101–111)
Glucose, Bld: 100 mg/dL — ABNORMAL HIGH (ref 65–99)
Potassium: 2.8 mmol/L — ABNORMAL LOW (ref 3.5–5.1)
SODIUM: 145 mmol/L (ref 135–145)
Total Bilirubin: 0.5 mg/dL (ref 0.3–1.2)
Total Protein: 5 g/dL — ABNORMAL LOW (ref 6.5–8.1)

## 2016-03-04 LAB — MAGNESIUM
MAGNESIUM: 0.8 mg/dL — AB (ref 1.7–2.4)
Magnesium: 2 mg/dL (ref 1.7–2.4)

## 2016-03-04 LAB — CULTURE, BLOOD (ROUTINE X 2)
CULTURE: NO GROWTH
CULTURE: NO GROWTH

## 2016-03-04 LAB — TROPONIN I
TROPONIN I: 0.06 ng/mL — AB (ref <0.031)
Troponin I: 0.03 ng/mL (ref <0.031)

## 2016-03-04 LAB — BASIC METABOLIC PANEL
Anion gap: 4 — ABNORMAL LOW (ref 5–15)
BUN: <5 mg/dL — ABNORMAL LOW (ref 6–20)
CALCIUM: 6.6 mg/dL — AB (ref 8.9–10.3)
CHLORIDE: 121 mmol/L — AB (ref 101–111)
CO2: 19 mmol/L — AB (ref 22–32)
CREATININE: 0.63 mg/dL (ref 0.44–1.00)
GFR calc non Af Amer: >60 mL/min (ref >60)
GLUCOSE: 86 mg/dL (ref 65–99)
Potassium: 3.7 mmol/L (ref 3.5–5.1)
Sodium: 144 mmol/L (ref 135–145)

## 2016-03-04 LAB — LACTIC ACID, PLASMA
LACTIC ACID, VENOUS: 4.5 mmol/L — AB (ref 0.5–2.0)
LACTIC ACID, VENOUS: 6 mmol/L — AB (ref 0.5–2.0)

## 2016-03-04 LAB — GLUCOSE, CAPILLARY
GLUCOSE-CAPILLARY: 100 mg/dL — AB (ref 65–99)
GLUCOSE-CAPILLARY: 127 mg/dL — AB (ref 65–99)
GLUCOSE-CAPILLARY: 65 mg/dL (ref 65–99)
Glucose-Capillary: 103 mg/dL — ABNORMAL HIGH (ref 65–99)

## 2016-03-04 MED ORDER — FAMOTIDINE 20 MG PO TABS
20.0000 mg | ORAL_TABLET | Freq: Two times a day (BID) | ORAL | Status: DC
  Administered 2016-03-04 – 2016-03-06 (×5): 20 mg via ORAL
  Filled 2016-03-04 (×5): qty 1

## 2016-03-04 MED ORDER — FAMOTIDINE IN NACL 20-0.9 MG/50ML-% IV SOLN: 20.0000 mg | Freq: Two times a day (BID) | INTRAVENOUS | Status: DC

## 2016-03-04 MED ORDER — SODIUM CHLORIDE 0.9 % IV BOLUS (SEPSIS)
1000.0000 mL | INTRAVENOUS | Status: AC
  Administered 2016-03-04 (×2): 1000 mL via INTRAVENOUS

## 2016-03-04 MED ORDER — POTASSIUM CL IN DEXTROSE 5% 20 MEQ/L IV SOLN
20.0000 meq | INTRAVENOUS | Status: DC
  Administered 2016-03-04 – 2016-03-06 (×3): 20 meq via INTRAVENOUS
  Filled 2016-03-04 (×4): qty 1000

## 2016-03-04 MED ORDER — MAGNESIUM SULFATE 4 GM/100ML IV SOLN
4.0000 g | Freq: Once | INTRAVENOUS | Status: AC
  Administered 2016-03-04: 4 g via INTRAVENOUS
  Filled 2016-03-04: qty 100

## 2016-03-04 MED ORDER — METHOCARBAMOL 500 MG PO TABS
500.0000 mg | ORAL_TABLET | Freq: Three times a day (TID) | ORAL | Status: DC | PRN
  Administered 2016-03-05 – 2016-03-06 (×2): 500 mg via ORAL
  Filled 2016-03-04 (×2): qty 1

## 2016-03-04 MED ORDER — POTASSIUM CHLORIDE CRYS ER 20 MEQ PO TBCR
40.0000 meq | EXTENDED_RELEASE_TABLET | ORAL | Status: AC
  Administered 2016-03-04 (×2): 40 meq via ORAL
  Filled 2016-03-04 (×2): qty 2

## 2016-03-04 NOTE — Consult Note (Signed)
Referring Provider: Dr. Allena Katz Primary Care Physician:  Georgann Housekeeper, MD Primary Gastroenterologist:  UNASSIGNED  Reason for Consultation:  Abdominal pain; Elevated LFTs  HPI: [name] is a 55 y.o. female being seen for a consult due to elevated LFTs and abdominal pain. Patient reports having left-sided abdominal pain at home that was severe but has since nearly completely resolved. She was found to have a lactic acidosis and was thought to have urosepsis. A CT scan on admit showed dilation of the CBD to 1.5 cm with a normal-appearing gallbladder. Fatty liver noted. No bowel inflammation or obstruction noted. She denies N/V. Feels a lot better than she had been feeling. Reported history of Crohn's disease but no Crohn's seen on colonoscopy by Dr. Matthias Hughs in June 2016. History of chronic diarrhea. Normal EGD in June 2016. Her LFTs increased today to AST 257 (84 yesterday); ALT 63 (35); TB 0.5 and ALP 113. On Eliquis due to a DVT.   Past Medical History  Diagnosis Date  . Glaucoma   . Diabetes mellitus without complication (HCC)   . Hypertension   . Neuropathy (HCC)   . Crohn disease (HCC)   . Asthma   . Pulmonary embolus (HCC) 05/24/2014  . Hypothyroidism   . GERD (gastroesophageal reflux disease)   . HLD (hyperlipidemia)   . C. difficile colitis   . Pancreatitis   . Asthma   . Tobacco abuse     Past Surgical History  Procedure Laterality Date  . Cesarean section    . Esophagogastroduodenoscopy N/A 05/21/2014    Procedure: ESOPHAGOGASTRODUODENOSCOPY (EGD);  Surgeon: Petra Kuba, MD;  Location: [facility] ENDOSCOPY;  Service: Endoscopy;  Laterality: N/A;  . Esophagogastroduodenoscopy N/A 03/02/2015    Procedure: ESOPHAGOGASTRODUODENOSCOPY (EGD);  Surgeon: Bernette Redbird, MD;  Location: [facility] ENDOSCOPY;  Service: Endoscopy;  Laterality: N/A;  . Colonoscopy N/A 03/02/2015    Procedure: COLONOSCOPY;  Surgeon: Bernette Redbird, MD;  Location: [facility] ENDOSCOPY;  Service: Endoscopy;  Laterality: N/A;     Prior to Admission medications   Medication Sig Start Date End Date Taking? Authorizing Provider  apixaban (ELIQUIS) 5 MG TABS tablet Take 2 tablets (10 mg total) by mouth 2 (two) times daily. 02/07/16  Yes Leroy Sea, MD  calcium carbonate (OS-CAL - DOSED IN MG OF ELEMENTAL CALCIUM) 1250 (500 CA) MG tablet Take 1 tablet (500 mg of elemental calcium total) by mouth daily with breakfast. 03/04/15  Yes Alison Murray, MD  cefpodoxime (VANTIN) 200 MG tablet Take 1 tablet (200 mg total) by mouth 2 (two) times daily. 02/07/16  Yes Leroy Sea, MD  docusate sodium (COLACE) 100 MG capsule Take 2 capsules (200 mg total) by mouth 2 (two) times daily. 02/07/16  Yes Leroy Sea, MD  feeding supplement, RESOURCE BREEZE, (RESOURCE BREEZE) LIQD Take 1 Container by mouth 3 (three) times daily between meals. 08/02/14  Yes Kathlen Mody, MD  folic acid (FOLVITE) 1 MG tablet Take 1 tablet (1 mg total) by mouth daily. 08/02/14  Yes Kathlen Mody, MD  gabapentin (NEURONTIN) 100 MG capsule Take 1 capsule (100 mg total) by mouth 2 (two) times daily. Patient taking differently: Take 800 mg by mouth 3 (three) times daily.  04/05/15  Yes Vassie Loll, MD  HYDROcodone-acetaminophen (NORCO/VICODIN) 5-325 MG tablet Take 1 tablet by mouth every 6 (six) hours as needed. Patient taking differently: Take 1 tablet by mouth every 6 (six) hours as needed for moderate pain.  01/09/16  Yes Derwood Kaplan, MD  HYDROmorphone (DILAUDID) 4 MG  tablet Take 8 mg by mouth every 4 (four) hours as needed for severe pain.   Yes Historical Provider, MD  levothyroxine (SYNTHROID, LEVOTHROID) 25 MCG tablet Take 1 tablet (25 mcg total) by mouth daily before breakfast. 08/02/14  Yes Kathlen Mody, MD  loperamide (IMODIUM) 2 MG capsule Take 1 capsule (2 mg total) by mouth 2 (two) times daily as needed for diarrhea or loose stools. 02/08/16  Yes Starleen Arms, MD  meclizine (ANTIVERT) 25 MG tablet Take 25 mg by mouth 3 (three) times daily as  needed for dizziness.   Yes Historical Provider, MD  ondansetron (ZOFRAN ODT) 4 MG disintegrating tablet Take 1 tablet (4 mg total) by mouth every 8 (eight) hours as needed for nausea or vomiting. 01/09/16  Yes Derwood Kaplan, MD  oxyCODONE-acetaminophen (PERCOCET/ROXICET) 5-325 MG tablet Take 1 tablet by mouth every 6 (six) hours as needed for moderate pain. 02/08/16  Yes Leana Roe Elgergawy, MD  pantoprazole (PROTONIX) 20 MG tablet Take 40 mg by mouth daily.   Yes Historical Provider, MD  polyethylene glycol (MIRALAX / GLYCOLAX) packet Take 17 g by mouth daily. 02/07/16  Yes Leroy Sea, MD  rosuvastatin (CRESTOR) 20 MG tablet Take 1 tablet (20 mg total) by mouth daily. 08/02/14  Yes Kathlen Mody, MD  saccharomyces boulardii (FLORASTOR) 250 MG capsule Take 1 capsule (250 mg total) by mouth 2 (two) times daily. 04/05/15  Yes Vassie Loll, MD  thiamine (VITAMIN B-1) 100 MG tablet Take 1 tablet (100 mg total) by mouth daily. 08/02/14  Yes Kathlen Mody, MD  traZODone (DESYREL) 25 mg TABS tablet Take 0.5 tablets (25 mg total) by mouth at bedtime as needed for sleep. 08/02/14  Yes Kathlen Mody, MD  venlafaxine (EFFEXOR) 25 MG tablet Take 1 tablet (25 mg total) by mouth 2 (two) times daily. 03/04/15  Yes Alison Murray, MD    Scheduled Meds: . apixaban  5 mg Oral BID  . calcium carbonate  1 tablet Oral TID WC  . famotidine  20 mg Oral BID  . feeding supplement  1 Container Oral TID BM  . folic acid  1 mg Oral Daily  . gabapentin  100 mg Oral BID  . insulin aspart  0-5 Units Subcutaneous QHS  . insulin aspart  0-9 Units Subcutaneous TID WC  . levothyroxine  25 mcg Oral QAC breakfast  . miconazole nitrate   Topical BID  . rosuvastatin  20 mg Oral q1800  . saccharomyces boulardii  250 mg Oral BID  . sodium chloride  1,000 mL Intravenous Q1 Hr x 2  . sodium chloride flush  3 mL Intravenous Q12H  . thiamine  100 mg Oral Daily  . venlafaxine  25 mg Oral BID   Continuous Infusions: . dextrose 5 % with  KCl 20 mEq / L 20 mEq (03/04/16 0833)   PRN Meds:.acetaminophen **OR** acetaminophen, diazepam, menthol-cetylpyridinium, methocarbamol, ondansetron (ZOFRAN) IV, ondansetron, oxyCODONE, phenol, technetium TC 85M diethylenetriame-pentaacetic acid, traZODone  Allergies as of 02/28/2016 - Review Complete 02/28/2016  Allergen Reaction Noted  . Iohexol Other (See Comments) 11/01/2003  . Spiriva handihaler [tiotropium bromide monohydrate] Nausea And Vomiting 07/26/2014  . Ativan [lorazepam] Other (See Comments) 12/28/2014  . Penicillins Hives 05/12/2007    Family History  Problem Relation Age of Onset  . Breast cancer Mother   . Heart disease Mother 39    CABG  . Diabetes Mother   . Heart disease Father 57    CABG  . Diabetes Father  Social History   Social History  . Marital Status: Divorced    Spouse Name: N/A  . Number of Children: N/A  . Years of Education: N/A   Occupational History  . Not on file.   Social History Main Topics  . Smoking status: Current Every Day Smoker -- 0.25 packs/day for 26 years    Types: Cigarettes  . Smokeless tobacco: Never Used  . Alcohol Use: No  . Drug Use: No  . Sexual Activity: Not on file   Other Topics Concern  . Not on file   Social History Narrative    Review of Systems: All negative except as stated above in HPI.  Physical Exam: Vital signs: Filed Vitals:   03/04/16 0835 03/04/16 1015  BP: 138/95   Pulse: 106   Temp: 98.7 F (37.1 C) 97.7 F (36.5 C)  Resp: 18 17   Last BM Date: 03/02/16 General:   Alert, thin, no acute distress HEENT: anicteric sclera Lungs:  Clear throughout to auscultation.   No wheezes, crackles, or rhonchi. No acute distress. Heart:  Regular rate and rhythm; no murmurs, clicks, rubs,  or gallops. Abdomen: diffuse tenderness with guarding, soft, nondistended, +BS  Rectal:  Deferred Ext: no LE edema Skin: no jaundice  GI:  Lab Results:  Recent Labs  03/02/16 0430 03/03/16 0410  03/04/16 0452  WBC 7.8 8.0 12.3*  HGB 8.4* 10.1* 9.4*  HCT 27.0* 32.0* 29.3*  PLT 102* 126* 157   BMET  Recent Labs  03/02/16 0430 03/03/16 0410 03/04/16 0452  NA 143 140 145  K 3.5 3.8 2.8*  CL 121* 114* 115*  CO2 16* 12* 18*  GLUCOSE 96 277* 100*  BUN <5* <5* <5*  CREATININE 0.77 0.94 0.82  CALCIUM 6.6* 6.9* 7.0*   LFT  Recent Labs  03/04/16 0452  PROT 5.0*  ALBUMIN 2.0*  AST 257*  ALT 63*  ALKPHOS 113  BILITOT 0.5   PT/INR  Recent Labs  03/03/16 0410  LABPROT 22.3*  INR 1.96*     Studies/Results: Ct Abdomen Pelvis W Contrast  03/03/2016  CLINICAL DATA:  Lactic acidosis. EXAM: CT ABDOMEN AND PELVIS WITH CONTRAST TECHNIQUE: Multidetector CT imaging of the abdomen and pelvis was performed using the standard protocol following bolus administration of intravenous contrast. CONTRAST:  1 ISOVUE-300 IOPAMIDOL (ISOVUE-300) INJECTION 61% COMPARISON:  02/06/2016 FINDINGS: Lower chest: Bilateral pleural effusions are identified, left greater than right. Hepatobiliary: Hepatic steatosis is noted. Cyst along the dome of liver is again noted. Normal appearance of the gallbladder. Increase caliber of the common bile duct measures 1.5 cm. No filling defects identified. Pancreas: No mass, inflammatory changes, or other significant abnormality. Spleen: Within normal limits in size and appearance. Adrenals/Urinary Tract: The adrenal glands appear normal. Mild bilateral renal atrophy. 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 The urinary bladder appears normal. Stomach/Bowel: The stomach appears normal. No pathologic dilatation of the large or small bowel loops. The pelvic small bowel loops are mildly increased in caliber measuring up to 2 cm. There is no abnormal small bowel wall thickening or inflammation. No pneumatosis or evidence of bowel perforation. No pathologic dilatation of the colon. No evidence for colonic pneumatosis or perforation. Vascular/Lymphatic: Aortic  atherosclerosis noted. There is a metallic stent within the left common iliac vein. No enlarged upper abdominal lymph nodes. No enlarged pelvic or inguinal lymph nodes. Reproductive: The uterus is either surgically absent or atrophic. No adnexal mass identified. Other: There is no ascites or focal fluid collections within the  abdomen or pelvis. Diffuse body wall edema identified. Musculoskeletal:  No suspicious bone abnormality identified. IMPRESSION: 1. No pathologic dilatation of the large or small bowel loops to suggest bowel obstruction. Additionally, there is no evidence for bowel perforation, abscess formation or pneumatosis. 2. Aortic atherosclerosis. 3. Hepatic steatosis. 4. Abnormal dilatation of the common bile duct which measures up to 15 mm. 5. Bilateral pleural effusions. Electronically Signed   By: Signa Kell M.D.   On: 03/03/2016 14:06   Dg Abd Portable 1v  03/02/2016  CLINICAL DATA:  Abdominal pain EXAM: PORTABLE ABDOMEN - 1 VIEW COMPARISON:  None. FINDINGS: A vascular stent is seen in the region of the left common iliac artery. The bowel gas pattern is unremarkable with no evidence of obstruction. Elevation of the right hemidiaphragm is identified. No free air, portal venous gas, or pneumatosis. IMPRESSION: No acute abnormality Electronically Signed   By: Gerome Sam III M.D   On: 03/02/2016 19:53    Impression/Plan: 55 yo with lactic acidosis of unclear etiology who has elevated transaminases and a dilated CBD without intrahepatic dilation. I do NOT think a biliary source is causing her lactic acidosis or her abdominal pain. Her increased LFTs could be due to meds vs hepatopathy. Normal TB, ALP go against an obstructive biliary source. If her LFTs continue to rise, then would do an MRCP to further evaluate the dilated CBD but for now would hold off and do supportive care. Recheck LFTs tomorrow. Will follow.    LOS: 5 days   Lemmie Vanlanen C.  03/04/2016, 1:33 PM  Pager  [phone]  If no answer or after 5 PM call [phone]

## 2016-03-04 NOTE — Progress Notes (Signed)
[facility] Progress Note  Patient: [name] LGX:[id number]   PCP: Wenda Low, MD DOB: 1960/11/11   DOA: 02/28/2016   DOS: 03/04/2016   Date of Service: the patient was seen and examined on 03/04/2016  Subjective: Patient continues with abdominal pain. No nausea no vomiting. No chest pain. No diarrhea no constipation. Tolerating oral diet. Nutrition: Minimal oral intake, tolerating oral diet  Brief hospital course: Patient was admitted on 02/28/2016, with complaint of Tachycardia, was found to have Sepsis due to UTI. On further workup the patient is found to have persistent lactic acidosis not responding to IV hydration. CT scan of the abdomen is showing no acute abnormality explaining lactic acidosis. Patient is not on any metformin. Gastroenterology is consulted due to dilated CBD as well as abdominal pain. Currently further plan is Continue further workup of lactic acidosis.  Assessment and Plan: 1. UTI (lower urinary tract infection) Expected Sepsis due to UTI. Met criteria on admission. Currently hemodynamically stable. Urine culture grew multiple organisms. Blood cultures remain negative. Finished 5 days of Levaquin.  2.metabolic acidosis, high anion gap due to Lactic acidosis. Normal anion gap due to hyperchloremia  Lactic acid on admission was 12.71. Continues to remain elevated. CT abdomen does not reveal any acute abnormality. Negative orthostatic. Continue D5 half normal saline. At present no clear etiology of lactic acid elevation is identified. Patient herself does not have any acute hemodynamic instability or complaint.  3. Chronic diarrhea. Patient mentions that she has chronic diarrhea. No diarrhea in the hospital. This can also contribute to normal anion gap acidosis although would not explain lactic acidosis. We will continue to monitor.  4. 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 Status post stenting in early May. Continue Apixaban.  5. Diabetes mellitus type 2. Continue sliding scale insulin.  6.Hypothyroidism. TSH stable. Continue Synthroid  7.Rash on the thigh. Barrier cream, wound care consult, antifungal  Powder.  8.Physical deconditioning. PTOT recommends SNF with the patient is refusing and would like to go to home. We'll continue to monitor.  9. Chronic pain. Patient is on Vicodin but due to Maxing out on Tylenol patient will be switched to [facility].  10. Dilated CBD without any stone. elevated LFT and no evidence of stones on CT scan. GI consulted, will follow recommendation  Pain management: When necessary OxyIR Activity: SNF per-physical therapy, patient wants to go home with home health Bowel regimen: last BM 03/02/2016 Diet: Soft diet DVT Prophylaxis: Therapeutic anticoagulation  Advance goals of care discussion: Full code  Family Communication: no family was present at bedside, at the time of interview.   Disposition:  Discharge to home, home health Expected discharge date: 03/05/2016  Consultants: none Procedures: none  Antibiotics: Anti-infectives    Start     Dose/Rate Route Frequency Ordered Stop   03/01/16 1500  levofloxacin (LEVAQUIN) IVPB 750 mg  Status:  Discontinued     750 mg 100 mL/hr over 90 Minutes Intravenous Every 48 hours 02/28/16 1507 02/29/16 0857   03/01/16 1000  levofloxacin (LEVAQUIN) tablet 750 mg  Status:  Discontinued     750 mg Oral Daily 02/29/16 0903 03/03/16 1537   02/29/16 1000  levofloxacin (LEVAQUIN) tablet 750 mg  Status:  Discontinued     750 mg Oral Daily 02/29/16 0858 02/29/16 0903   02/28/16 1745  fluconazole (DIFLUCAN) tablet 50 mg  Status:  Discontinued     50 mg Oral Daily 02/28/16 1743  03/04/16 0732   02/28/16 1743  levofloxacin (LEVAQUIN) IVPB 500 mg  Status:  Discontinued     500 mg 100 mL/hr over 60 Minutes Intravenous Every 24 hours 02/28/16 1743 02/28/16 1944   02/28/16  1500  levofloxacin (LEVAQUIN) IVPB 750 mg     750 mg 100 mL/hr over 90 Minutes Intravenous  Once 02/28/16 1449 02/28/16 1822   02/28/16 1500  aztreonam (AZACTAM) 2 g in dextrose 5 % 50 mL IVPB  Status:  Discontinued     2 g 100 mL/hr over 30 Minutes Intravenous  Once 02/28/16 1449 02/28/16 1507      Intake/Output Summary (Last 24 hours) at 03/04/16 1939 Last data filed at 03/04/16 1623  Gross per 24 hour  Intake    300 ml  Output   2451 ml  Net  -2151 ml   Filed Weights   02/28/16 1500 03/01/16 0427 03/01/16 2058  Weight: 52.8 kg (116 lb 6.5 oz) 49.034 kg (108 lb 1.6 oz) 48.217 kg (106 lb 4.8 oz)    Objective: Physical Exam: Filed Vitals:   03/03/16 2045 03/04/16 0600 03/04/16 0835 03/04/16 1015  BP: 137/85 136/92 138/95   Pulse: 99 90 106   Temp: 99.1 F (37.3 C) 98.2 F (36.8 C) 98.7 F (37.1 C) 97.7 F (36.5 C)  TempSrc: Oral Oral Oral Oral  Resp: _0 Height:      Weight:      SpO2: 100% 100% 100%    General: Alert, Awake and Oriented to Time, Place and Person. Appear in mild distress Eyes: PERRL, Conjunctiva normal ENT: Oral Mucosa clear moist. Neck: no JVD, no Abnormal Mass Or lumps Cardiovascular: S1 and S2 Present, aortic systolic Murmur, Peripheral Pulses Present Respiratory: Bilateral Air entry equal and Decreased, Clear to Auscultation, no Crackles, no wheezes Abdomen: Bowel Sound present, Soft and mild tenderness Skin: no redness, no Rash  Extremities: no Pedal edema, no calf tenderness Neurologic: Grossly no focal neuro deficit. Bilaterally Equal motor strength  Data Reviewed: CBC:  Recent Labs Lab 02/28/16 1315 02/28/16 2320 03/01/16 0543 03/02/16 0430 03/03/16 0410 03/04/16 0452  WBC 15.7* 10.1 9.9 7.8 8.0 12.3*  NEUTROABS 13.3* 7.2  --   --  7.3 9.3*  HGB 11.6* 9.3* 9.9* 8.4* 10.1* 9.4*  HCT 35.8* 29.1* 31.4* 27.0* 32.0* 29.3*  MCV 96.5 97.3 99.7 101.5* 98.8 100.3*  PLT 172 128* 106* 102* 126* 308   Basic Metabolic  Panel:  Recent Labs Lab 02/28/16 2320 03/01/16 0754 03/02/16 0430 03/03/16 0410 03/04/16 0452  NA 138 142 143 140 145  K 3.4* 3.3* 3.5 3.8 2.8*  CL 104 118* 121* 114* 115*  CO2 19* 17* 16* 12* 18*  GLUCOSE 98 68 96 277* 100*  BUN <5* <5* <5* <5* <5*  CREATININE 1.01* 0.76 0.77 0.94 0.82  CALCIUM 6.8* 6.5* 6.6* 6.9* 7.0*  MG  --   --   --  1.0* 0.8*    Liver Function Tests:  Recent Labs Lab 02/28/16 1315 02/28/16 2320 03/03/16 0410 03/04/16 0452  AST 139* 114* 84* 257*  ALT 45 36 35 63*  ALKPHOS 192* 155* 138* 113  BILITOT 0.9 0.7 0.2* 0.5  PROT 5.9* 4.5* 4.8* 5.0*  ALBUMIN 2.4* 1.8* 1.9* 2.0*    Recent Labs Lab 03/03/16 1457  LIPASE 13   No results for input(s): AMMONIA in the last 168 hours. Coagulation Profile:  Recent Labs Lab 03/03/16 0410  INR 1.96*   Cardiac Enzymes:  Recent Labs Lab  02/28/16 1315 03/02/16 1109 03/04/16 0836  CKTOTAL  --  346*  --   TROPONINI <0.03  --  0.06*   BNP (last 3 results) No results for input(s): PROBNP in the last 8760 hours.  CBG:  Recent Labs Lab 03/03/16 2035 03/04/16 0800 03/04/16 1143 03/04/16 1614 03/04/16 1822  GLUCAP 125* 100* 127* 65 103*    Studies: No results found.   Scheduled Meds: . apixaban  5 mg Oral BID  . calcium carbonate  1 tablet Oral TID WC  . famotidine  20 mg Oral BID  . feeding supplement  1 Container Oral TID BM  . folic acid  1 mg Oral Daily  . gabapentin  100 mg Oral BID  . insulin aspart  0-5 Units Subcutaneous QHS  . insulin aspart  0-9 Units Subcutaneous TID WC  . levothyroxine  25 mcg Oral QAC breakfast  . miconazole nitrate   Topical BID  . rosuvastatin  20 mg Oral q1800  . saccharomyces boulardii  250 mg Oral BID  . sodium chloride flush  3 mL Intravenous Q12H  . thiamine  100 mg Oral Daily  . venlafaxine  25 mg Oral BID   Continuous Infusions: . dextrose 5 % with KCl 20 mEq / L 20 mEq (03/04/16 0833)   PRN Meds: acetaminophen **OR** acetaminophen,  diazepam, menthol-cetylpyridinium, methocarbamol, ondansetron (ZOFRAN) IV, ondansetron, oxyCODONE, phenol, technetium TC 52M diethylenetriame-pentaacetic acid, traZODone  Time spent: 30 minutes  Author: Berle Mull, MD [facility] Pager: [phone] 03/04/2016 7:39 PM  If 7PM-7AM, please contact night-coverage at [email or web address], password [facility]

## 2016-03-04 NOTE — Progress Notes (Signed)
CRITICAL VALUE ALERT  Critical value received:  Lactic Acid = 4.5  Date of notification:  03-04-2016  Time of notification:  09:35  Critical value read back:Yes.    Nurse who received alert:  Meridee Score, RN  MD notified (1st page):  Allena Katz  Time of first page:  09:40  MD notified (2nd page):  Time of second page:  Responding MD:  Allena Katz  Time MD responded:  09:41  Orders received to check orthostatic vitals & EKG.

## 2016-03-04 NOTE — Progress Notes (Signed)
CRITICAL VALUE ALERT  Critical value received:  Magnesium 0.8  Date of notification:  03/04/2016  Time of notification:  0701  Critical value read back:Yes.    Nurse who received alert:  Jola Schmidt   MD notified (1st page):  [facility] notified via Amion  Time of first page:  0703  MD notified (2nd page):  Time of second page:  Responding MD:    Time MD responded:

## 2016-03-05 DIAGNOSIS — R1013 Epigastric pain: Secondary | ICD-10-CM

## 2016-03-05 DIAGNOSIS — N390 Urinary tract infection, site not specified: Secondary | ICD-10-CM

## 2016-03-05 DIAGNOSIS — R109 Unspecified abdominal pain: Secondary | ICD-10-CM | POA: Insufficient documentation

## 2016-03-05 DIAGNOSIS — N179 Acute kidney failure, unspecified: Secondary | ICD-10-CM

## 2016-03-05 LAB — CBC WITH DIFFERENTIAL/PLATELET
BASOS ABS: 0 10*3/uL (ref 0.0–0.1)
Basophils Relative: 0 %
EOS PCT: 0 %
Eosinophils Absolute: 0 10*3/uL (ref 0.0–0.7)
HEMATOCRIT: 27.4 % — AB (ref 36.0–46.0)
Hemoglobin: 8.9 g/dL — ABNORMAL LOW (ref 12.0–15.0)
LYMPHS PCT: 38 %
Lymphs Abs: 3.5 10*3/uL (ref 0.7–4.0)
MCH: 31.3 pg (ref 26.0–34.0)
MCHC: 32.5 g/dL (ref 30.0–36.0)
MCV: 96.5 fL (ref 78.0–100.0)
Monocytes Absolute: 0.6 10*3/uL (ref 0.1–1.0)
Monocytes Relative: 6 %
NEUTROS ABS: 5 10*3/uL (ref 1.7–7.7)
Neutrophils Relative %: 56 %
PLATELETS: 133 10*3/uL — AB (ref 150–400)
RBC: 2.84 MIL/uL — AB (ref 3.87–5.11)
RDW: 19.9 % — AB (ref 11.5–15.5)
WBC: 9.1 10*3/uL (ref 4.0–10.5)

## 2016-03-05 LAB — COMPREHENSIVE METABOLIC PANEL
ALK PHOS: 110 U/L (ref 38–126)
ALT: 77 U/L — ABNORMAL HIGH (ref 14–54)
ANION GAP: 7 (ref 5–15)
AST: 327 U/L — ABNORMAL HIGH (ref 15–41)
Albumin: 1.9 g/dL — ABNORMAL LOW (ref 3.5–5.0)
BILIRUBIN TOTAL: 0.1 mg/dL — AB (ref 0.3–1.2)
BUN: <5 mg/dL — ABNORMAL LOW (ref 6–20)
CALCIUM: 6.9 mg/dL — AB (ref 8.9–10.3)
CO2: 16 mmol/L — ABNORMAL LOW (ref 22–32)
Chloride: 118 mmol/L — ABNORMAL HIGH (ref 101–111)
Creatinine, Ser: 0.59 mg/dL (ref 0.44–1.00)
GFR calc Af Amer: >60 mL/min (ref >60)
Glucose, Bld: 91 mg/dL (ref 65–99)
POTASSIUM: 4.4 mmol/L (ref 3.5–5.1)
Sodium: 141 mmol/L (ref 135–145)
TOTAL PROTEIN: 4.5 g/dL — AB (ref 6.5–8.1)

## 2016-03-05 LAB — GLUCOSE, CAPILLARY
GLUCOSE-CAPILLARY: 82 mg/dL (ref 65–99)
GLUCOSE-CAPILLARY: 75 mg/dL (ref 65–99)
GLUCOSE-CAPILLARY: 162 mg/dL — AB (ref 65–99)
Glucose-Capillary: 117 mg/dL — ABNORMAL HIGH (ref 65–99)
Glucose-Capillary: 104 mg/dL — ABNORMAL HIGH (ref 65–99)

## 2016-03-05 LAB — HEPATIC FUNCTION PANEL
ALK PHOS: 114 U/L (ref 38–126)
ALT: 78 U/L — AB (ref 14–54)
AST: 326 U/L — ABNORMAL HIGH (ref 15–41)
Albumin: 2 g/dL — ABNORMAL LOW (ref 3.5–5.0)
BILIRUBIN INDIRECT: 0.2 mg/dL — AB (ref 0.3–0.9)
BILIRUBIN TOTAL: 0.4 mg/dL (ref 0.3–1.2)
Bilirubin, Direct: 0.2 mg/dL (ref 0.1–0.5)
Total Protein: 4.7 g/dL — ABNORMAL LOW (ref 6.5–8.1)

## 2016-03-05 LAB — LACTIC ACID, PLASMA: LACTIC ACID, VENOUS: 3.5 mmol/L — AB (ref 0.5–2.0)

## 2016-03-05 LAB — TROPONIN I: Troponin I: 0.03 ng/mL (ref <0.031)

## 2016-03-05 LAB — MAGNESIUM: Magnesium: 1.8 mg/dL (ref 1.7–2.4)

## 2016-03-05 MED ORDER — MAGIC MOUTHWASH W/LIDOCAINE
5.0000 mL | Freq: Once | ORAL | Status: AC
  Administered 2016-03-05: 5 mL via ORAL
  Filled 2016-03-05: qty 5

## 2016-03-05 NOTE — Care Management Important Message (Signed)
Important Message  Patient Details  Name: BRANDYCE DIMARIO MRN: [id number] Date of Birth: 1961/02/15   Medicare Important Message Given:  Yes    Bernadette Hoit 03/05/2016, 12:07 PM

## 2016-03-05 NOTE — Progress Notes (Signed)
During the night, the patient complained of soreness of her tongue.  Upon assessment, an approximately 0.5 cm area on the right lateral back of the tongue was noted.  [facility] was made aware.  Rec'd order for Magic Mouthwash with lidocaine was ordered.  This was given with good results.  Also, let [facility] know that Lactic Acid at 0231 was 3.5.  No additional orders received.  Will continue to monitor patient,  Bernie Covey RN-BC, WTA.

## 2016-03-05 NOTE — Progress Notes (Signed)
[facility] Progress Note  Patient: [name] LKG:[id number]   PCP: Wenda Low, MD DOB: 23-Jun-1961   DOA: 02/28/2016   DOS: 03/05/2016   Date of Service: the patient was seen and examined on 03/05/2016   HPI: ADELAIDA [name] is a 55 y.o. female being seen for a consult due to elevated LFTs and abdominal pain. Patient reports having left-sided abdominal pain at home that was severe but has since nearly completely resolved. She was found to have a lactic acidosis and was thought to have urosepsis. A CT scan on admit showed dilation of the CBD to 1.5 cm with a normal-appearing gallbladder. Fatty liver noted. No bowel inflammation or obstruction noted. She denies N/V. Feels a lot better than she had been feeling. Reported history of Crohn's disease but no Crohn's seen on colonoscopy by Dr. Cristina [name] in June 2016. History of chronic diarrhea. Normal EGD in June 2016. Her LFTs increased today to AST 257 (84 yesterday); ALT 63 (35); TB 0.5 and ALP 113. On Eliquis due to a DVT. Patient also treated for urinary tract infection, sepsis/dehydration this admission. Gastroenterology consulted for dilated CBD and increasing liver function     Assessment and Plan:    UTI (lower urinary tract infection) Expected Sepsis due to UTI. Met criteria on admission. Currently hemodynamically stable. Urine culture grew multiple organisms. Blood cultures remain negative. Finished 5 days of Levaquin.  Metabolic acidosis, high anion gap due to Lactic acidosis. Normal anion gap due to hyperchloremia Abnormal liver function Lactic acid on admission was 12.71.>3.5  Continues to remain elevated. CT abdomen does not reveal any acute abnormality. Negative orthostatic. Continue D5 half normal saline. At present no clear etiology of lactic acid elevation is identified. Patient herself does not have any acute hemodynamic instability or complaint.  Transaminitis-common bile ductal dilatation-normal total  bilirubin/alkaline phosphatase MRCP ordered and pending   Chronic diarrhea. Patient mentions that she has chronic diarrhea. No diarrhea in the hospital. This can also contribute to normal anion gap acidosis although would not explain lactic acidosis. We will continue to monitor.   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 Continue Apixaban.   Diabetes mellitus type 2. Continue sliding scale insulin. Last hemoglobin A1c 4.9   Hypothyroidism. TSH stable. Continue Synthroid   Rash on the thigh. Barrier cream, wound care consult, antifungal  Powder.   Physical deconditioning. PTOT recommends SNF with the patient is refusing and would like to go to home. We'll continue to monitor.   . Chronic pain. Patient is on Vicodin but due to Maxing out on Tylenol patient will be switched to [facility].   Dilated CBD without any stone. elevated LFT and no evidence of stones on CT scan. GI consulted, will follow recommendation  Pain management: When necessary OxyIR Activity: SNF per-physical therapy, patient wants to go home with home health   DVT Prophylaxis: Therapeutic anticoagulation  Advance goals of care discussion: Full code  Family Communication: no family was present at bedside, at the time of interview.   Disposition:  Discharge to home, home health Expected discharge date:  Pending GI recommendations  Consultants: Gastroenterology  Procedures: none  Antibiotics: Aztreonam 5/301 dose Levofloxacin 5/30-6/3 Fluconazole 5/30-6/4    Intake/Output Summary (Last 24 hours) at 03/05/16 1322 Last data filed at 03/05/16 1218  Gross per 24 hour  Intake   1443 ml  Output   1825 ml  Net   -  382 ml   Filed Weights   02/28/16 1500 03/01/16 0427 03/01/16 2058  Weight: 52.8 kg (116 lb 6.5 oz) 49.034 kg (108 lb 1.6 oz) 48.217 kg (106 lb 4.8 oz)     Objective: Physical Exam: Filed Vitals:   03/04/16 1015 03/04/16 2215 03/05/16 0513 03/05/16 0840  BP:  120/93 135/93 140/86  Pulse:  89 86 81  Temp: 97.7 F (36.5 C) 98.5 F (36.9 C) 97.7 F (36.5 C) 98.1 F (36.7 C)  TempSrc: Oral Oral Oral Oral  Resp: _0 Height:      Weight:      SpO2:  100% 100% 100%   General: Alert, Awake and Oriented to Time, Place and Person. Appear in mild distress Eyes: PERRL, Conjunctiva normal ENT: Oral Mucosa clear moist. Neck: no JVD, no Abnormal Mass Or lumps Cardiovascular: S1 and S2 Present, aortic systolic Murmur, Peripheral Pulses Present Respiratory: Bilateral Air entry equal and Decreased, Clear to Auscultation, no Crackles, no wheezes Abdomen: Bowel Sound present, Soft and mild tenderness Skin: no redness, no Rash  Extremities: no Pedal edema, no calf tenderness Neurologic: Grossly no focal neuro deficit. Bilaterally Equal motor strength  Data Reviewed: CBC:  Recent Labs Lab 02/28/16 1315 02/28/16 2320 03/01/16 0543 03/02/16 0430 03/03/16 0410 03/04/16 0452 03/05/16 0120  WBC 15.7* 10.1 9.9 7.8 8.0 12.3* 9.1  NEUTROABS 13.3* 7.2  --   --  7.3 9.3* 5.0  HGB 11.6* 9.3* 9.9* 8.4* 10.1* 9.4* 8.9*  HCT 35.8* 29.1* 31.4* 27.0* 32.0* 29.3* 27.4*  MCV 96.5 97.3 99.7 101.5* 98.8 100.3* 96.5  PLT 172 128* 106* 102* 126* 157 875*   Basic Metabolic Panel:  Recent Labs Lab 03/02/16 0430 03/03/16 0410 03/04/16 0452 03/04/16 1912 03/05/16 0120  NA 143 140 145 144 141  K 3.5 3.8 2.8* 3.7 4.4  CL 121* 114* 115* 121* 118*  CO2 16* 12* 18* 19* 16*  GLUCOSE 96 277* 100* 86 91  BUN <5* <5* <5* <5* <5*  CREATININE 0.77 0.94 0.82 0.63 0.59  CALCIUM 6.6* 6.9* 7.0* 6.6* 6.9*  MG  --  1.0* 0.8* 2.0 1.8    Liver Function Tests:  Recent Labs Lab 02/28/16 1315 02/28/16 2320 03/03/16 0410 03/04/16 0452 03/05/16 0120  AST 139* 114* 84* 257* 326*  327*  ALT 45 36 35 63* 78*  77*  ALKPHOS 192* 155* 138* 113 114   110  BILITOT 0.9 0.7 0.2* 0.5 0.4  0.1*  PROT 5.9* 4.5* 4.8* 5.0* 4.7*  4.5*  ALBUMIN 2.4* 1.8* 1.9* 2.0* 2.0*  1.9*    Recent Labs Lab 03/03/16 1457  LIPASE 13   No results for input(s): AMMONIA in the last 168 hours. Coagulation Profile:  Recent Labs Lab 03/03/16 0410  INR 1.96*   Cardiac Enzymes:  Recent Labs Lab 02/28/16 1315 03/02/16 1109 03/04/16 0836 03/04/16 1912 03/05/16 0120  CKTOTAL  --  346*  --   --   --   TROPONINI <0.03  --  0.06* 0.03 0.03   BNP (last 3 results) No results for input(s): PROBNP in the last 8760 hours.  CBG:  Recent Labs Lab 03/04/16 1614 03/04/16 1822 03/04/16 2221 03/05/16 0827 03/05/16 1212  GLUCAP 65 103* 82 75 117*    Studies: No results found.   Scheduled Meds: . apixaban  5 mg Oral BID  . calcium carbonate  1 tablet Oral TID WC  . famotidine  20 mg Oral BID  . feeding supplement  1 Container Oral  TID BM  . folic acid  1 mg Oral Daily  . gabapentin  100 mg Oral BID  . insulin aspart  0-5 Units Subcutaneous QHS  . insulin aspart  0-9 Units Subcutaneous TID WC  . levothyroxine  25 mcg Oral QAC breakfast  . miconazole nitrate   Topical BID  . rosuvastatin  20 mg Oral q1800  . saccharomyces boulardii  250 mg Oral BID  . sodium chloride flush  3 mL Intravenous Q12H  . thiamine  100 mg Oral Daily  . venlafaxine  25 mg Oral BID   Continuous Infusions: . dextrose 5 % with KCl 20 mEq / L 20 mEq (03/05/16 0950)   PRN Meds: acetaminophen **OR** acetaminophen, diazepam, menthol-cetylpyridinium, methocarbamol, ondansetron (ZOFRAN) IV, ondansetron, oxyCODONE, phenol, technetium TC 48M diethylenetriame-pentaacetic acid, traZODone  Time spent: 30 minutes  Author: Reyne Dumas , MD [facility] Pager:  [phone]  03/05/2016 1:22 PM  If 7PM-7AM, please contact night-coverage at [email or web address], password [facility]

## 2016-03-05 NOTE — Progress Notes (Signed)
PT Cancellation Note  Patient Details Name: [name] MRN: [id number] DOB: [birth year]   Cancelled Treatment:    Reason Eval/Treat Not Completed: Patient declined; she wasn't expecting PT and has company  Attempted to call pt x3 this afternoon to schedule a time to work with her (she has requested this and PT tries to accomodate); phone busy each time. On arrival, pt had phone "off the hook." Discussed likelihood that she would not see PT tomorrow due to her procedure, and yet she still declined PT at this time. She asked for PT department phone number and she will call 6/6 after her procedure if she wants to work with PT.   [name] 03/05/2016, 5:05 PM  Pager [phone]

## 2016-03-05 NOTE — Progress Notes (Signed)
Patient ID: [name], female   DOB: 07-May-1961, 55 y.o.   MRN: [id number] [facility] Gastroenterology Progress Note  [name] 55 y.o. 09-04-1961   Subjective: Feels ok. Denies abdominal pain. Nurse in room.  Objective: Vital signs in last 24 hours: Filed Vitals:   03/04/16 2215 03/05/16 0513  BP: 120/93 135/93  Pulse: 89 86  Temp: 98.5 F (36.9 C) 97.7 F (36.5 C)  Resp: 16 16    Physical Exam: Gen: alert, no acute distress HEENT: anicteric sclera CV: RRR Chest: CTA B Abd: diffuse tenderness with guarding, soft, nondistended, +BS  Lab Results:  Recent Labs  03/04/16 1912 03/05/16 0120  NA 144 141  K 3.7 4.4  CL 121* 118*  CO2 19* 16*  GLUCOSE 86 91  BUN <5* <5*  CREATININE 0.63 0.59  CALCIUM 6.6* 6.9*  MG 2.0 1.8    Recent Labs  03/04/16 0452 03/05/16 0120  AST 257* 326*  327*  ALT 63* 78*  77*  ALKPHOS 113 114  110  BILITOT 0.5 0.4  0.1*  PROT 5.0* 4.7*  4.5*  ALBUMIN 2.0* 2.0*  1.9*    Recent Labs  03/04/16 0452 03/05/16 0120  WBC 12.3* 9.1  NEUTROABS 9.3* 5.0  HGB 9.4* 8.9*  HCT 29.3* 27.4*  MCV 100.3* 96.5  PLT 157 133*    Recent Labs  03/03/16 0410  LABPROT 22.3*  INR 1.96*      Assessment/Plan: 55 yo with lactic acidosis of unclear etiology and a dilated CBD without intrahepatic dilation with persistently elevated AST > ALT. Will do MRCP to further evaluate due to the elevated LFTs and dilated CBD. Diffuse abdominal pain would be atypical for a biliary source.   [name] C. 03/05/2016, 9:54 AM  Pager [phone]  If no answer or after 5 PM call [phone]

## 2016-03-06 ENCOUNTER — Inpatient Hospital Stay (HOSPITAL_COMMUNITY): Payer: Medicare Other

## 2016-03-06 DIAGNOSIS — R1084 Generalized abdominal pain: Secondary | ICD-10-CM

## 2016-03-06 LAB — CBC
HEMATOCRIT: 28.3 % — AB (ref 36.0–46.0)
Hemoglobin: 9 g/dL — ABNORMAL LOW (ref 12.0–15.0)
MCH: 30.9 pg (ref 26.0–34.0)
MCHC: 31.8 g/dL (ref 30.0–36.0)
MCV: 97.3 fL (ref 78.0–100.0)
PLATELETS: 112 10*3/uL — AB (ref 150–400)
RBC: 2.91 MIL/uL — AB (ref 3.87–5.11)
RDW: 19.7 % — AB (ref 11.5–15.5)
WBC: 8.4 10*3/uL (ref 4.0–10.5)

## 2016-03-06 LAB — COMPREHENSIVE METABOLIC PANEL
ALT: 63 U/L — AB (ref 14–54)
AST: 181 U/L — AB (ref 15–41)
Albumin: 2 g/dL — ABNORMAL LOW (ref 3.5–5.0)
Alkaline Phosphatase: 112 U/L (ref 38–126)
Anion gap: 11 (ref 5–15)
CHLORIDE: 110 mmol/L (ref 101–111)
CO2: 22 mmol/L (ref 22–32)
CREATININE: 0.53 mg/dL (ref 0.44–1.00)
Calcium: 7.9 mg/dL — ABNORMAL LOW (ref 8.9–10.3)
GFR calc Af Amer: >60 mL/min (ref >60)
Glucose, Bld: 77 mg/dL (ref 65–99)
POTASSIUM: 4.4 mmol/L (ref 3.5–5.1)
SODIUM: 143 mmol/L (ref 135–145)
Total Bilirubin: 0.5 mg/dL (ref 0.3–1.2)
Total Protein: 4.5 g/dL — ABNORMAL LOW (ref 6.5–8.1)

## 2016-03-06 LAB — GLUCOSE, CAPILLARY
Glucose-Capillary: 86 mg/dL (ref 65–99)
Glucose-Capillary: 85 mg/dL (ref 65–99)

## 2016-03-06 MED ORDER — VITAMIN B-1 100 MG PO TABS: 100.0000 mg | ORAL_TABLET | Freq: Every day | ORAL | Status: DC

## 2016-03-06 MED ORDER — LEVOTHYROXINE SODIUM 25 MCG PO TABS: 25.0000 ug | ORAL_TABLET | Freq: Every day | ORAL | Status: DC

## 2016-03-06 MED ORDER — GABAPENTIN 100 MG PO CAPS: 100.0000 mg | ORAL_CAPSULE | Freq: Two times a day (BID) | ORAL | Status: DC

## 2016-03-06 MED ORDER — CLONAZEPAM 0.5 MG PO TABS: 0.5000 mg | ORAL_TABLET | Freq: Once | ORAL | Status: DC

## 2016-03-06 MED ORDER — APIXABAN 5 MG PO TABS: 10.0000 mg | ORAL_TABLET | Freq: Two times a day (BID) | ORAL | Status: DC

## 2016-03-06 MED ORDER — FOLIC ACID 1 MG PO TABS: 1.0000 mg | ORAL_TABLET | Freq: Every day | ORAL | Status: DC

## 2016-03-06 MED ORDER — PANTOPRAZOLE SODIUM 20 MG PO TBEC: 40.0000 mg | DELAYED_RELEASE_TABLET | Freq: Every day | ORAL | Status: DC

## 2016-03-06 NOTE — Progress Notes (Signed)
Patient ID: [name], female   DOB: September 17, 1961, 55 y.o.   MRN: [id number] [facility] Gastroenterology Progress Note  RAFAELITA GREEVER 55 y.o. 05/19/1961   Subjective: Sleeping comfortably. Easily woken up. Minimal abdominal pain. Denies N/V. Wants to eat. Refusing to let me examine her this morning.  Objective: Vital signs: Filed Vitals:   03/06/16 0436 03/06/16 0833  BP: 157/100 137/85  Pulse: 88 68  Temp: 98.5 F (36.9 C) 98.8 F (37.1 C)  Resp: 18 17    Physical Exam: Gen: thin, no acute distress  HEENT: anicteric sclera  Lab Results:  Recent Labs  03/04/16 1912 03/05/16 0120 03/06/16 0637  NA 144 141 143  K 3.7 4.4 4.4  CL 121* 118* 110  CO2 19* 16* 22  GLUCOSE 86 91 77  BUN <5* <5* <5*  CREATININE 0.63 0.59 0.53  CALCIUM 6.6* 6.9* 7.9*  MG 2.0 1.8  --     Recent Labs  03/05/16 0120 03/06/16 0637  AST 326*  327* 181*  ALT 78*  77* 63*  ALKPHOS 114  110 112  BILITOT 0.4  0.1* 0.5  PROT 4.7*  4.5* 4.5*  ALBUMIN 2.0*  1.9* 2.0*    Recent Labs  03/04/16 0452 03/05/16 0120 03/06/16 0637  WBC 12.3* 9.1 8.4  NEUTROABS 9.3* 5.0  --   HGB 9.4* 8.9* 9.0*  HCT 29.3* 27.4* 28.3*  MCV 100.3* 96.5 97.3  PLT 157 133* 112*      Assessment/Plan: 55 yo with lactic acidosis of unclear etiology and a dilated CBD without intrahepatic dilation. AST and ALT are normalizing but source of rise not known. Awaiting MRCP that was ordered yesterday and not done yet. If MRCP unrevealing, then patient can be placed on a low fat diet.  Trebor Galdamez C. 03/06/2016, 8:59 AM  Pager [phone]  If no answer or after 5 PM call [phone]

## 2016-03-06 NOTE — Care Management Note (Signed)
Case Management Note  Patient Details  Name: [name] MRN: [id number] Date of Birth: Aug 23, 1961  Subjective/Objective:     CM following for progression and d/c planning.               Action/Plan: 03/06/2016 Pt hesitant to go home, asking to continue stay until tomorrow as that would be a "better" day to go home . This CM discussed d/c with the pt and explained that she is no longer requiring acute care. Pt has agreed to go home however insist that she needs an ambulance as she states "I can not walk". Pt states that she has no key however that there is a way to get into her apartment , she did not take a key when she went to her MD office just prior to this admission and locked the door, stating that there is a way to get in. We are unable to reach her son and pt is ready for d/c. Ambulance notified. HH arranged with [facility].   Expected Discharge Date:     03/06/2016             Expected Discharge Plan:  Home w Home Health Services  In-House Referral:  NA  Discharge planning Services  CM Consult  Post Acute Care Choice:  Resumption of Svcs/PTA Provider, Home Health Choice offered to:  Patient  DME Arranged:    DME Agency:     HH Arranged:  PT, OT, Nurse's Aide, RN HH Agency:  [facility]  Status of Service:  Completed, signed off  Medicare Important Message Given:  Yes Date Medicare IM Given:    Medicare IM give by:    Date Additional Medicare IM Given:    Additional Medicare Important Message give by:     If discussed at Long Length of Stay Meetings, dates discussed:    Additional Comments:  Starlyn Skeans, RN 03/06/2016, 2:53 PM

## 2016-03-06 NOTE — Progress Notes (Signed)
Pt was brought down to MRI for MRCP.  Pt was put in scanner and expressed feelings of claustrophobia and wanted out.  Tried several methods to help pt tolerate but no avail.  Called floor RN who offered to get meds ordered.  Pt brought out of scanner and put back into bed and explained nurse was coming to give meds.  At this point she was receptive to this.  A few mins later she asked what was going on and she said she was going to get meds and she said, 'but you mean up in my room.'  I said 'no, she's coming down here'.  Pt insisted that she be taken up to her room that she was not staying in the department.  Transport sent for pt and nurse notified.  Pt also had problems with her IV, she screamed when I tried to flush a small amount of saline.  Abdominal imaging requires a power injection of at least 1.67ml/sec to be successful.  She says all her veins are like that they are always sore when anyone injects that you have to inject very slowly.  So we were going to do exam without contrast as she could not tolerate injection but then pt refused to be scanned and was taken back up to the floor.  Please advise when pt is more receptive or if another avenue will be attempted.  Thank you.

## 2016-03-06 NOTE — Progress Notes (Signed)
Ellouise Newer to be D/C'd Home per MD order.  Discussed prescriptions and follow up appointments with the patient. Prescriptions given to patient, medication list explained in detail. Pt verbalized understanding.    Medication List    STOP taking these medications        cefpodoxime 200 MG tablet  Commonly known as:  VANTIN     oxyCODONE-acetaminophen 5-325 MG tablet  Commonly known as:  PERCOCET/ROXICET     rosuvastatin 20 MG tablet  Commonly known as:  CRESTOR      TAKE these medications        apixaban 5 MG Tabs tablet  Commonly known as:  ELIQUIS  Take 2 tablets (10 mg total) by mouth 2 (two) times daily.     calcium carbonate 1250 (500 Ca) MG tablet  Commonly known as:  OS-CAL - dosed in mg of elemental calcium  Take 1 tablet (500 mg of elemental calcium total) by mouth daily with breakfast.     docusate sodium 100 MG capsule  Commonly known as:  COLACE  Take 2 capsules (200 mg total) by mouth 2 (two) times daily.     feeding supplement Liqd  Take 1 Container by mouth 3 (three) times daily between meals.     folic acid 1 MG tablet  Commonly known as:  FOLVITE  Take 1 tablet (1 mg total) by mouth daily.     gabapentin 100 MG capsule  Commonly known as:  NEURONTIN  Take 1 capsule (100 mg total) by mouth 2 (two) times daily.     HYDROcodone-acetaminophen 5-325 MG tablet  Commonly known as:  NORCO/VICODIN  Take 1 tablet by mouth every 6 (six) hours as needed.     HYDROmorphone 4 MG tablet  Commonly known as:  DILAUDID  Take 8 mg by mouth every 4 (four) hours as needed for severe pain.     levothyroxine 25 MCG tablet  Commonly known as:  SYNTHROID, LEVOTHROID  Take 1 tablet (25 mcg total) by mouth daily before breakfast.     loperamide 2 MG capsule  Commonly known as:  IMODIUM  Take 1 capsule (2 mg total) by mouth 2 (two) times daily as needed for diarrhea or loose stools.     meclizine 25 MG tablet  Commonly known as:  ANTIVERT  Take 25 mg by mouth 3  (three) times daily as needed for dizziness.     ondansetron 4 MG disintegrating tablet  Commonly known as:  ZOFRAN ODT  Take 1 tablet (4 mg total) by mouth every 8 (eight) hours as needed for nausea or vomiting.     pantoprazole 20 MG tablet  Commonly known as:  PROTONIX  Take 2 tablets (40 mg total) by mouth daily.     polyethylene glycol packet  Commonly known as:  MIRALAX / GLYCOLAX  Take 17 g by mouth daily.     saccharomyces boulardii 250 MG capsule  Commonly known as:  FLORASTOR  Take 1 capsule (250 mg total) by mouth 2 (two) times daily.     thiamine 100 MG tablet  Commonly known as:  VITAMIN B-1  Take 1 tablet (100 mg total) by mouth daily.     traZODone 25 mg Tabs tablet  Commonly known as:  DESYREL  Take 0.5 tablets (25 mg total) by mouth at bedtime as needed for sleep.     venlafaxine 25 MG tablet  Commonly known as:  EFFEXOR  Take 1 tablet (25 mg total) by mouth 2 (two)  times daily.        Filed Vitals:   03/06/16 0833 03/06/16 1248  BP: 137/85 143/91  Pulse: 68 80  Temp: 98.8 F (37.1 C) 98.9 F (37.2 C)  Resp: 17 16    Skin clean, dry and intact without evidence of skin break down, no evidence of skin tears noted. IV catheter discontinued intact. Site without signs and symptoms of complications. Dressing and pressure applied. Pt denies pain at this time. No complaints noted.  An After Visit Summary was printed and given to the patient. Patient escorted via ambulance by [facility] home.  Jonell Cluck RN [facility] Phone [phone]

## 2016-03-06 NOTE — Progress Notes (Signed)
Patient alert and oriented X 4 and in no distress.  Patient given discharge instructions regarding medication, s/s to report, activity, and diet.  Patient instructed to follow up with PCP for pain medication and mail order prescriptions. Patient verbalized understanding of all instructions.  Telemetry and peripheral IV discontinued.  EMS present to transport patient home.  Patient contacted her son who agreed to meet her at home as EMS stated that they could not leave patient at home alone.

## 2016-03-06 NOTE — Discharge Summary (Signed)
Physician Discharge Summary  [name] MRN: [id number] DOB/AGE: 57-Jun-1962 57 y.o.  PCP: Wenda Low, MD   Admit date: 02/28/2016 Discharge date: 03/06/2016  Discharge Diagnoses:     Principal Problem:   UTI (lower urinary tract infection) Active Problems:   DM2 (diabetes mellitus, type 2) (HCC)   HTN (hypertension)   Asthma   Metabolic acidosis   Normocytic anemia   Physical deconditioning   Dehydration   Hepatic steatosis   Chronic diarrhea of unknown origin   Severe protein-calorie malnutrition (HCC)   Chronic pain   Hypothyroidism   HLD (hyperlipidemia)   May-Thurner syndrome   Excoriated rash-medial thighs   Sepsis (HCC)   Abdominal pain   AKI (acute kidney injury) ([place])   Abdominal pain, generalized    Follow-up recommendations Follow-up with PCP in 3-5 days , including all  additional recommended appointments as below Follow-up CBC, CMP in 3-5 days PCP-currently monitor liver function closely, if trending up patient would need to follow-up with gastroenterology, Dr. Cristina Gong  is the patient's primary gastroenterologist and he will see her in on a when necessary basis Called son [name],[name] at [phone], the voicemail had a different name therefore no voice message left     Current Discharge Medication List    CONTINUE these medications which have NOT CHANGED   Details  apixaban (ELIQUIS) 5 MG TABS tablet Take 2 tablets (10 mg total) by mouth 2 (two) times daily. Qty: 7 tablet, Refills: 0    calcium carbonate (OS-CAL - DOSED IN MG OF ELEMENTAL CALCIUM) 1250 (500 CA) MG tablet Take 1 tablet (500 mg of elemental calcium total) by mouth daily with breakfast. Qty: 30 tablet, Refills: 0    docusate sodium (COLACE) 100 MG capsule Take 2 capsules (200 mg total) by mouth 2 (two) times daily. Qty: 20 capsule, Refills: 0    feeding supplement, RESOURCE BREEZE, (RESOURCE BREEZE) LIQD Take 1 Container by mouth 3 (three) times daily between meals. Refills:  0    folic acid (FOLVITE) 1 MG tablet Take 1 tablet (1 mg total) by mouth daily. Qty: 30 tablet, Refills: 0    gabapentin (NEURONTIN) 100 MG capsule Take 1 capsule (100 mg total) by mouth 2 (two) times daily.    HYDROcodone-acetaminophen (NORCO/VICODIN) 5-325 MG tablet Take 1 tablet by mouth every 6 (six) hours as needed. Qty: 4 tablet, Refills: 0    HYDROmorphone (DILAUDID) 4 MG tablet Take 8 mg by mouth every 4 (four) hours as needed for severe pain.    levothyroxine (SYNTHROID, LEVOTHROID) 25 MCG tablet Take 1 tablet (25 mcg total) by mouth daily before breakfast. Qty: 30 tablet, Refills: 0    loperamide (IMODIUM) 2 MG capsule Take 1 capsule (2 mg total) by mouth 2 (two) times daily as needed for diarrhea or loose stools. Qty: 10 capsule, Refills: 0    meclizine (ANTIVERT) 25 MG tablet Take 25 mg by mouth 3 (three) times daily as needed for dizziness.    ondansetron (ZOFRAN ODT) 4 MG disintegrating tablet Take 1 tablet (4 mg total) by mouth every 8 (eight) hours as needed for nausea or vomiting. Qty: 20 tablet, Refills: 0    pantoprazole (PROTONIX) 20 MG tablet Take 40 mg by mouth daily.    polyethylene glycol (MIRALAX / GLYCOLAX) packet Take 17 g by mouth daily. Qty: 30 each, Refills: 0    saccharomyces boulardii (FLORASTOR) 250 MG capsule Take 1 capsule (250 mg total) by mouth 2 (two) times daily.    thiamine (VITAMIN B-1) 100  MG tablet Take 1 tablet (100 mg total) by mouth daily. Qty: 30 tablet, Refills: 0    traZODone (DESYREL) 25 mg TABS tablet Take 0.5 tablets (25 mg total) by mouth at bedtime as needed for sleep. Qty: 30 tablet, Refills: 0    venlafaxine (EFFEXOR) 25 MG tablet Take 1 tablet (25 mg total) by mouth 2 (two) times daily. Qty: 30 tablet, Refills: 0      STOP taking these medications     cefpodoxime (VANTIN) 200 MG tablet      oxyCODONE-acetaminophen (PERCOCET/ROXICET) 5-325 MG tablet      rosuvastatin (CRESTOR) 20 MG tablet      ondansetron  (ZOFRAN) 4 MG tablet          Discharge Condition: stable   Discharge Instructions Get Medicines reviewed and adjusted: Please take all your medications with you for your next visit with your Primary MD  Please request your Primary MD to go over all hospital tests and procedure/radiological results at the follow up, please ask your Primary MD to get all Hospital records sent to his/her office.  If you experience worsening of your admission symptoms, develop shortness of breath, life threatening emergency, suicidal or homicidal thoughts you must seek medical attention immediately by calling 911 or calling your MD immediately if symptoms less severe.  You must read complete instructions/literature along with all the possible adverse reactions/side effects for all the Medicines you take and that have been prescribed to you. Take any new Medicines after you have completely understood and accpet all the possible adverse reactions/side effects.   Do not drive when taking Pain medications.   Do not take more than prescribed Pain, Sleep and Anxiety Medications  Special Instructions: If you have smoked or chewed Tobacco in the last 2 yrs please stop smoking, stop any regular Alcohol and or any Recreational drug use.  Wear Seat belts while driving.  Please note  You were cared for by a hospitalist during your hospital stay. Once you are discharged, your primary care physician will handle any further medical issues. Please note that NO REFILLS for any discharge medications will be authorized once you are discharged, as it is imperative that you return to your primary care physician (or establish a relationship with a primary care physician if you do not have one) for your aftercare needs so that they can reassess your need for medications and monitor your lab values.  Discharge Instructions    Diet - low sodium heart healthy    Complete by:  As directed      Increase activity slowly     Complete by:  As directed             Allergies  Allergen Reactions  . Iohexol Other (See Comments)    "severe burning" Patient has received Contrast in 2005 with 13 hour pre-medication, and had no reaction at that time  . Spiriva Handihaler [Tiotropium Bromide Monohydrate] Nausea And Vomiting  . Iodinated Diagnostic Agents Other (See Comments)  . Ativan [Lorazepam] Other (See Comments)    hallucinations  . Penicillins Hives    Has had cephalosporins      Disposition: Home with home health   Consults: GI     Significant Diagnostic Studies:  Ct Abdomen Pelvis Wo Contrast  02/06/2016  CLINICAL DATA:  Abdominal pain, diffuse. EXAM: CT ABDOMEN AND PELVIS WITHOUT CONTRAST TECHNIQUE: Multidetector CT imaging of the abdomen and pelvis was performed following the standard protocol without IV contrast. COMPARISON:  Radiographs 02/05/2016.  CT 01/09/2016. FINDINGS: In the lower chest, there is posterior left lung base opacity which may be atelectatic but infectious infiltrate cannot be excluded. There is a 2 cm mass and the liver dome, present over the past several examinations dating back to at least 07/30/2011. There are otherwise unremarkable unenhanced appearances of the liver and bile ducts. There are unremarkable unenhanced appearances of the spleen, pancreas, adrenals and renal parenchyma. Multiple 2-3 mm collecting system calculi are present in both kidneys but there is no evidence of ureteral obstruction. There is no ureteral calculus. Urinary bladder is nearly empty, with a Foley catheter, and appears grossly unremarkable. The abdominal aorta is normal in caliber with mild atherosclerotic calcification. There are vascular stents within the left common iliac vein. The colon is moderately distended with air and stool. No evidence of bowel obstruction. Unremarkable small bowel and stomach. No acute inflammatory changes are evident in the abdomen or pelvis. There is no adenopathy. There is  no ascites. There is no significant musculoskeletal lesion. IMPRESSION: 1. Moderate colonic distention with stool and air. Negative for bowel obstruction or perforation 2. No acute inflammatory changes are evident in the abdomen or pelvis. 3. Left lung base atelectasis or infiltrate. 4. Bilateral nephrolithiasis. No ureteral calculus. No evidence of ureteral obstruction Electronically Signed   By: Andreas Newport M.D.   On: 02/06/2016 02:27   Ct Abdomen Pelvis W Contrast  03/03/2016  CLINICAL DATA:  Lactic acidosis. EXAM: CT ABDOMEN AND PELVIS WITH CONTRAST TECHNIQUE: Multidetector CT imaging of the abdomen and pelvis was performed using the standard protocol following bolus administration of intravenous contrast. CONTRAST:  1 ISOVUE-300 IOPAMIDOL (ISOVUE-300) INJECTION 61% COMPARISON:  02/06/2016 FINDINGS: Lower chest: Bilateral pleural effusions are identified, left greater than right. Hepatobiliary: Hepatic steatosis is noted. Cyst along the dome of liver is again noted. Normal appearance of the gallbladder. Increase caliber of the common bile duct measures 1.5 cm. No filling defects identified. Pancreas: No mass, inflammatory changes, or other significant abnormality. Spleen: Within normal limits in size and appearance. Adrenals/Urinary Tract: The adrenal glands appear normal. Mild bilateral renal atrophy. 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 The urinary bladder appears normal. Stomach/Bowel: The stomach appears normal. No pathologic dilatation of the large or small bowel loops. The pelvic small bowel loops are mildly increased in caliber measuring up to 2 cm. There is no abnormal small bowel wall thickening or inflammation. No pneumatosis or evidence of bowel perforation. No pathologic dilatation of the colon. No evidence for colonic pneumatosis or perforation. Vascular/Lymphatic: Aortic atherosclerosis noted. There is a metallic stent within the left common iliac vein. No enlarged upper abdominal  lymph nodes. No enlarged pelvic or inguinal lymph nodes. Reproductive: The uterus is either surgically absent or atrophic. No adnexal mass identified. Other: There is no ascites or focal fluid collections within the abdomen or pelvis. Diffuse body wall edema identified. Musculoskeletal:  No suspicious bone abnormality identified. IMPRESSION: 1. No pathologic dilatation of the large or small bowel loops to suggest bowel obstruction. Additionally, there is no evidence for bowel perforation, abscess formation or pneumatosis. 2. Aortic atherosclerosis. 3. Hepatic steatosis. 4. Abnormal dilatation of the common bile duct which measures up to 15 mm. 5. Bilateral pleural effusions. Electronically Signed   By: Kerby Moors M.D.   On: 03/03/2016 14:06   Nm Pulmonary Perf And Vent  02/28/2016  CLINICAL DATA:  Current increasing fatigue which shortness-of-breath and left-sided chest pain with palpitations and near syncope. History of pulmonary embolism and DVT. EXAM: NUCLEAR  MEDICINE VENTILATION - PERFUSION LUNG SCAN TECHNIQUE: Ventilation images were obtained in multiple projections using inhaled aerosol Tc-59mDTPA. Perfusion images were obtained in multiple projections after intravenous injection of Tc-932mAA. RADIOPHARMACEUTICALS:  32.0 MCi Technetium-9953mPA aerosol inhalation and 4.1 mCi Technetium-4m76m IV COMPARISON:  Chest x-ray 02/28/2016 and abdominal CT 02/06/2016 FINDINGS: Ventilation: No focal ventilation defect. Subtle perihilar heterogeneity. Perfusion: No wedge shaped peripheral perfusion defects to suggest acute pulmonary embolism. IMPRESSION: Normal ventilation perfusion lung scan. Electronically Signed   By: DaniMarin Olp.   On: 02/28/2016 16:54   Dg Chest Port 1 View  02/28/2016  CLINICAL DATA:  55 y80r old with 2 week history of mid to left-sided chest pain, shortness of breath and tachycardia. Current smoker. EXAM: PORTABLE CHEST 1 VIEW COMPARISON:  02/05/2016, 01/29/2016 and earlier,  including CTA chest 03/31/2015. FINDINGS: Cardiac silhouette normal in size, unchanged. Thoracic aorta mildly tortuous and atherosclerotic, unchanged. Hilar and mediastinal contours otherwise unremarkable. Lungs clear. Bronchovascular markings normal. Pulmonary vascularity normal. No visible pleural effusions. No pneumothorax. IMPRESSION: No acute cardiopulmonary disease. Electronically Signed   By: ThomEvangeline Dakin.   On: 02/28/2016 15:08   Dg Chest Port 1 View  02/05/2016  CLINICAL DATA:  Shortness of Breath. EXAM: PORTABLE CHEST 1 VIEW COMPARISON:  Chest x-ray 01/29/2016 FINDINGS: Right PICC line tip is in the distal SVC just above the cavoatrial junction. The heart is normal in size. The mediastinal and hilar contours are normal. The lungs are clear. Prominent skin fold noted over the right chest but no definite pneumothorax. Possible very small left pleural effusion. The bony thorax is intact. IMPRESSION: No acute cardiopulmonary findings. Possible very small left pleural effusion. Right PICC line in good position. Electronically Signed   By: P.  Marijo Sanes.   On: 02/05/2016 20:40   Dg Abd Portable 1v  03/02/2016  CLINICAL DATA:  Abdominal pain EXAM: PORTABLE ABDOMEN - 1 VIEW COMPARISON:  None. FINDINGS: A vascular stent is seen in the region of the left common iliac artery. The bowel gas pattern is unremarkable with no evidence of obstruction. Elevation of the right hemidiaphragm is identified. No free air, portal venous gas, or pneumatosis. IMPRESSION: No acute abnormality Electronically Signed   By: DaviDorise Bullion M.D   On: 03/02/2016 19:53   Dg Abd Portable 1v  02/05/2016  CLINICAL DATA:  Abdominal pain. EXAM: PORTABLE ABDOMEN - 1 VIEW COMPARISON:  None. FINDINGS: The bowel gas pattern is unremarkable. No dilated bowel loops are present. A left common iliac venous stent is noted. No acute bony abnormalities noted. IMPRESSION: No acute abnormalities. Electronically Signed   By: JeffMargarette CanadaD.   On: 02/05/2016 20:40       Filed Weights   03/01/16 0427 03/01/16 2058 03/05/16 2039  Weight: 49.034 kg (108 lb 1.6 oz) 48.217 kg (106 lb 4.8 oz) 49.351 kg (108 lb 12.8 oz)     Microbiology: Recent Results (from the past 240 hour(s))  Urine culture     Status: Abnormal   Collection Time: 02/28/16 12:10 PM  Result Value Ref Range Status   Specimen Description URINE, CLEAN CATCH  Final   Special Requests NONE  Final   Culture MULTIPLE SPECIES PRESENT, SUGGEST RECOLLECTION (A)  Final   Report Status 02/29/2016 FINAL  Final  Blood Culture (routine x 2)     Status: None   Collection Time: 02/28/16  4:30 PM  Result Value Ref Range Status   Specimen Description BLOOD LEFT HAND  Final  Special Requests IN PEDIATRIC BOTTLE 1CC  Final   Culture NO GROWTH 5 DAYS  Final   Report Status 03/04/2016 FINAL  Final  Blood Culture (routine x 2)     Status: None   Collection Time: 02/28/16  4:43 PM  Result Value Ref Range Status   Specimen Description BLOOD RIGHT FOREARM  Final   Special Requests IN PEDIATRIC BOTTLE 3CC  Final   Culture NO GROWTH 5 DAYS  Final   Report Status 03/04/2016 FINAL  Final  MRSA PCR Screening     Status: None   Collection Time: 03/01/16  6:55 PM  Result Value Ref Range Status   MRSA by PCR NEGATIVE NEGATIVE Final    Comment:        The GeneXpert MRSA Assay (FDA approved for NASAL specimens only), is one component of a comprehensive MRSA colonization surveillance program. It is not intended to diagnose MRSA infection nor to guide or monitor treatment for MRSA infections.        Blood Culture    Component Value Date/Time   SDES BLOOD RIGHT FOREARM 02/28/2016 1643   SPECREQUEST IN PEDIATRIC BOTTLE 3CC 02/28/2016 1643   CULT NO GROWTH 5 DAYS 02/28/2016 1643   REPTSTATUS 03/04/2016 FINAL 02/28/2016 1643      Labs: Results for orders placed or performed during the hospital encounter of 02/28/16 (from the past 48 hour(s))  Glucose, capillary      Status: None   Collection Time: 03/04/16  4:14 PM  Result Value Ref Range   Glucose-Capillary 65 65 - 99 mg/dL  Glucose, capillary     Status: Abnormal   Collection Time: 03/04/16  6:22 PM  Result Value Ref Range   Glucose-Capillary 103 (H) 65 - 99 mg/dL  Basic metabolic panel     Status: Abnormal   Collection Time: 03/04/16  7:12 PM  Result Value Ref Range   Sodium 144 135 - 145 mmol/L   Potassium 3.7 3.5 - 5.1 mmol/L   Chloride 121 (H) 101 - 111 mmol/L   CO2 19 (L) 22 - 32 mmol/L   Glucose, Bld 86 65 - 99 mg/dL   BUN <5 (L) 6 - 20 mg/dL   Creatinine, Ser 0.63 0.44 - 1.00 mg/dL   Calcium 6.6 (L) 8.9 - 10.3 mg/dL   GFR calc non Af Amer >60 >60 mL/min   GFR calc Af Amer >60 >60 mL/min    Comment: (NOTE) The eGFR has been calculated using the CKD EPI equation. This calculation has not been validated in all clinical situations. eGFR's persistently <60 mL/min signify possible Chronic Kidney Disease.    Anion gap 4 (L) 5 - 15  Magnesium     Status: None   Collection Time: 03/04/16  7:12 PM  Result Value Ref Range   Magnesium 2.0 1.7 - 2.4 mg/dL  Troponin I (q 6hr x 3)     Status: None   Collection Time: 03/04/16  7:12 PM  Result Value Ref Range   Troponin I 0.03 <0.031 ng/mL    Comment:        NO INDICATION OF MYOCARDIAL INJURY.   Blood gas, arterial     Status: Abnormal   Collection Time: 03/04/16  8:15 PM  Result Value Ref Range   FIO2 0.21    pH, Arterial 7.388 7.350 - 7.450   pCO2 arterial 30.0 (L) 35.0 - 45.0 mmHg   pO2, Arterial 120 (H) 80.0 - 100.0 mmHg   Bicarbonate 17.8 (L) 20.0 - 24.0 mEq/L  TCO2 18.7 0 - 100 mmol/L   Acid-base deficit 6.3 (H) 0.0 - 2.0 mmol/L   O2 Saturation 98.9 %   Patient temperature 98.1    Collection site LEFT RADIAL    Drawn by [phone]    Sample type ARTERIAL DRAW    Allens test (pass/fail) PASS PASS  Glucose, capillary     Status: None   Collection Time: 03/04/16 10:21 PM  Result Value Ref Range   Glucose-Capillary 82 65 - 99  mg/dL  Hepatic function panel     Status: Abnormal   Collection Time: 03/05/16  1:20 AM  Result Value Ref Range   Total Protein 4.7 (L) 6.5 - 8.1 g/dL   Albumin 2.0 (L) 3.5 - 5.0 g/dL   AST 326 (H) 15 - 41 U/L   ALT 78 (H) 14 - 54 U/L   Alkaline Phosphatase 114 38 - 126 U/L   Total Bilirubin 0.4 0.3 - 1.2 mg/dL   Bilirubin, Direct 0.2 0.1 - 0.5 mg/dL   Indirect Bilirubin 0.2 (L) 0.3 - 0.9 mg/dL  Troponin I (q 6hr x 3)     Status: None   Collection Time: 03/05/16  1:20 AM  Result Value Ref Range   Troponin I 0.03 <0.031 ng/mL    Comment:        NO INDICATION OF MYOCARDIAL INJURY.   Comprehensive metabolic panel     Status: Abnormal   Collection Time: 03/05/16  1:20 AM  Result Value Ref Range   Sodium 141 135 - 145 mmol/L   Potassium 4.4 3.5 - 5.1 mmol/L   Chloride 118 (H) 101 - 111 mmol/L   CO2 16 (L) 22 - 32 mmol/L   Glucose, Bld 91 65 - 99 mg/dL   BUN <5 (L) 6 - 20 mg/dL   Creatinine, Ser 0.59 0.44 - 1.00 mg/dL   Calcium 6.9 (L) 8.9 - 10.3 mg/dL   Total Protein 4.5 (L) 6.5 - 8.1 g/dL   Albumin 1.9 (L) 3.5 - 5.0 g/dL   AST 327 (H) 15 - 41 U/L   ALT 77 (H) 14 - 54 U/L   Alkaline Phosphatase 110 38 - 126 U/L   Total Bilirubin 0.1 (L) 0.3 - 1.2 mg/dL   GFR calc non Af Amer >60 >60 mL/min   GFR calc Af Amer >60 >60 mL/min    Comment: (NOTE) The eGFR has been calculated using the CKD EPI equation. This calculation has not been validated in all clinical situations. eGFR's persistently <60 mL/min signify possible Chronic Kidney Disease.    Anion gap 7 5 - 15  CBC with Differential/Platelet     Status: Abnormal   Collection Time: 03/05/16  1:20 AM  Result Value Ref Range   WBC 9.1 4.0 - 10.5 K/uL   RBC 2.84 (L) 3.87 - 5.11 MIL/uL   Hemoglobin 8.9 (L) 12.0 - 15.0 g/dL   HCT 27.4 (L) 36.0 - 46.0 %   MCV 96.5 78.0 - 100.0 fL   MCH 31.3 26.0 - 34.0 pg   MCHC 32.5 30.0 - 36.0 g/dL   RDW 19.9 (H) 11.5 - 15.5 %   Platelets 133 (L) 150 - 400 K/uL   Neutrophils Relative % 56  %   Neutro Abs 5.0 1.7 - 7.7 K/uL   Lymphocytes Relative 38 %   Lymphs Abs 3.5 0.7 - 4.0 K/uL   Monocytes Relative 6 %   Monocytes Absolute 0.6 0.1 - 1.0 K/uL   Eosinophils Relative 0 %   Eosinophils Absolute  0.0 0.0 - 0.7 K/uL   Basophils Relative 0 %   Basophils Absolute 0.0 0.0 - 0.1 K/uL  Magnesium     Status: None   Collection Time: 03/05/16  1:20 AM  Result Value Ref Range   Magnesium 1.8 1.7 - 2.4 mg/dL  Lactic acid, plasma     Status: Abnormal   Collection Time: 03/05/16  1:20 AM  Result Value Ref Range   Lactic Acid, Venous 3.5 (HH) 0.5 - 2.0 mmol/L    Comment: CRITICAL RESULT CALLED TO, READ BACK BY AND VERIFIED WITH: GENGLER,K RN 03/05/2016 0231 JORDANS   Glucose, capillary     Status: None   Collection Time: 03/05/16  8:27 AM  Result Value Ref Range   Glucose-Capillary 75 65 - 99 mg/dL  Glucose, capillary     Status: Abnormal   Collection Time: 03/05/16 12:12 PM  Result Value Ref Range   Glucose-Capillary 117 (H) 65 - 99 mg/dL   Comment 1 Notify RN    Comment 2 Document in Chart   Glucose, capillary     Status: Abnormal   Collection Time: 03/05/16  5:22 PM  Result Value Ref Range   Glucose-Capillary 162 (H) 65 - 99 mg/dL  Glucose, capillary     Status: Abnormal   Collection Time: 03/05/16  8:35 PM  Result Value Ref Range   Glucose-Capillary 104 (H) 65 - 99 mg/dL  Comprehensive metabolic panel     Status: Abnormal   Collection Time: 03/06/16  6:37 AM  Result Value Ref Range   Sodium 143 135 - 145 mmol/L   Potassium 4.4 3.5 - 5.1 mmol/L   Chloride 110 101 - 111 mmol/L   CO2 22 22 - 32 mmol/L   Glucose, Bld 77 65 - 99 mg/dL   BUN <5 (L) 6 - 20 mg/dL   Creatinine, Ser 0.53 0.44 - 1.00 mg/dL   Calcium 7.9 (L) 8.9 - 10.3 mg/dL   Total Protein 4.5 (L) 6.5 - 8.1 g/dL   Albumin 2.0 (L) 3.5 - 5.0 g/dL   AST 181 (H) 15 - 41 U/L   ALT 63 (H) 14 - 54 U/L   Alkaline Phosphatase 112 38 - 126 U/L   Total Bilirubin 0.5 0.3 - 1.2 mg/dL   GFR calc non Af Amer >60 >60  mL/min   GFR calc Af Amer >60 >60 mL/min    Comment: (NOTE) The eGFR has been calculated using the CKD EPI equation. This calculation has not been validated in all clinical situations. eGFR's persistently <60 mL/min signify possible Chronic Kidney Disease.    Anion gap 11 5 - 15  CBC     Status: Abnormal   Collection Time: 03/06/16  6:37 AM  Result Value Ref Range   WBC 8.4 4.0 - 10.5 K/uL   RBC 2.91 (L) 3.87 - 5.11 MIL/uL   Hemoglobin 9.0 (L) 12.0 - 15.0 g/dL   HCT 28.3 (L) 36.0 - 46.0 %   MCV 97.3 78.0 - 100.0 fL   MCH 30.9 26.0 - 34.0 pg   MCHC 31.8 30.0 - 36.0 g/dL   RDW 19.7 (H) 11.5 - 15.5 %   Platelets 112 (L) 150 - 400 K/uL    Comment: SPECIMEN CHECKED FOR CLOTS REPEATED TO VERIFY PLATELET COUNT CONFIRMED BY SMEAR   Glucose, capillary     Status: None   Collection Time: 03/06/16  7:59 AM  Result Value Ref Range   Glucose-Capillary 86 65 - 99 mg/dL  Glucose, capillary  Status: None   Collection Time: 03/06/16 12:34 PM  Result Value Ref Range   Glucose-Capillary 85 65 - 99 mg/dL      [name] is a 55 y.o. female being seen for a consult due to elevated LFTs and abdominal pain. Patient reports having left-sided abdominal pain at home that was severe but has since nearly completely resolved. She was found to have a lactic acidosis and was thought to have urosepsis. A CT scan on admit showed dilation of the CBD to 1.5 cm with a normal-appearing gallbladder. Fatty liver noted. No bowel inflammation or obstruction noted. She denies N/V. Feels a lot better than she had been feeling. Reported history of Crohn's disease but no Crohn's seen on colonoscopy by Dr. Cristina Gong in June 2016. History of chronic diarrhea. Normal EGD in June 2016. Her LFTs increased today to AST 257 (84 yesterday); ALT 63 (35); TB 0.5 and ALP 113. On Eliquis due to a DVT. Patient also treated for urinary tract infection, sepsis/dehydration this admission. Gastroenterology consulted for dilated CBD and  increasing liver function    Assessment and Plan:  UTI (lower urinary tract infection) Expected Sepsis due to UTI. Met criteria on admission. Currently hemodynamically stable. Urine culture grew multiple organisms. Blood cultures remain negative. Finished 5 days of Levaquin.  Metabolic acidosis, high anion gap due to Lactic acidosis. Normal anion gap due to hyperchloremia Abnormal liver function Lactic acid on admission was 12.71.>3.5 Continues to remain elevated. CT abdomen does not reveal any acute abnormality. Negative orthostatic. Continue D5 half normal saline. At present no clear etiology of lactic acid elevation is identified. Patient herself does not have any acute hemodynamic instability or complaint.  Transaminitis-common bile ductal dilatation-normal total bilirubin/alkaline phosphatase MRCP attempted but the patient refused to do it secondary to claustrophobia If the patient's liver function remains elevated patient would benefit from open MRI in the outpatient setting Advise PCP to keep monitoring the patient's liver function tests if abnormal follow-up with outpatient gastroenterology   Chronic diarrhea. Patient mentions that she has chronic diarrhea. No diarrhea in the hospital since yesterday This can also contribute to normal anion gap acidosis although would not explain lactic acidosis. We will continue to monitor.  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 Continue Apixaban.  Diabetes mellitus type 2. Continue sliding scale insulin. Last hemoglobin A1c 4.9  Hypothyroidism. TSH stable. Continue Synthroid  Rash on the thigh. Barrier cream, wound care consult, antifungal Powder.  Physical deconditioning. PTOT recommends SNF with the patient is refusing and would like to go to home. We'll continue to monitor.  . Chronic pain.   Patient is on oral Dilaudid and Percocet at home No prescriptions were provided at the time of discharge to manage her chronic pain and this was deferred to patient's PCP   Dilated CBD without any stone. elevated LFT and no evidence of stones on CT scan. GI consulted,  discussed with Dr. Michail Sermon, okay to discharge the patient is liver function is improving and the patient has declined MRI     DVT Prophylaxis: Therapeutic anticoagulation  Advance goals of care discussion: Full code  Family Communication: no family was present at bedside, at the time of interview.   Disposition:  Discharge to home, home health       Blood pressure 143/91, pulse 80, temperature 98.9 F (37.2 C), temperature source Oral, resp. rate 16, height 5'  5" (1.651 m), weight 49.351 kg (108 lb 12.8 oz), last menstrual period 10/01/2010, SpO2 97 %. General: Alert, Awake and Oriented to Time, Place and Person. Appear in mild distress Eyes: PERRL, Conjunctiva normal ENT: Oral Mucosa clear moist. Neck: no JVD, no Abnormal Mass Or lumps Cardiovascular: S1 and S2 Present, aortic systolic Murmur, Peripheral Pulses Present Respiratory: Bilateral Air entry equal and Decreased, Clear to Auscultation, no Crackles, no wheezes Abdomen: Bowel Sound present, Soft and mild tenderness Skin: no redness, no Rash  Extremities: no Pedal edema, no calf tenderness Neurologic: Grossly no focal neuro deficit. Bilaterally Equal motor strength       Signed: Elle Vezina 03/06/2016, 1:21 PM        Time spent >45 mins

## 2016-03-12 DIAGNOSIS — N390 Urinary tract infection, site not specified: Secondary | ICD-10-CM | POA: Diagnosis not present

## 2016-03-12 DIAGNOSIS — I82492 Acute embolism and thrombosis of other specified deep vein of left lower extremity: Secondary | ICD-10-CM | POA: Diagnosis not present

## 2016-03-12 DIAGNOSIS — I82412 Acute embolism and thrombosis of left femoral vein: Secondary | ICD-10-CM | POA: Diagnosis not present

## 2016-03-12 DIAGNOSIS — I82432 Acute embolism and thrombosis of left popliteal vein: Secondary | ICD-10-CM | POA: Diagnosis not present

## 2016-03-12 DIAGNOSIS — I82442 Acute embolism and thrombosis of left tibial vein: Secondary | ICD-10-CM | POA: Diagnosis not present

## 2016-03-12 DIAGNOSIS — E1140 Type 2 diabetes mellitus with diabetic neuropathy, unspecified: Secondary | ICD-10-CM | POA: Diagnosis not present

## 2016-03-14 DIAGNOSIS — N390 Urinary tract infection, site not specified: Secondary | ICD-10-CM | POA: Diagnosis not present

## 2016-03-14 DIAGNOSIS — I82492 Acute embolism and thrombosis of other specified deep vein of left lower extremity: Secondary | ICD-10-CM | POA: Diagnosis not present

## 2016-03-14 DIAGNOSIS — I82442 Acute embolism and thrombosis of left tibial vein: Secondary | ICD-10-CM | POA: Diagnosis not present

## 2016-03-14 DIAGNOSIS — I82412 Acute embolism and thrombosis of left femoral vein: Secondary | ICD-10-CM | POA: Diagnosis not present

## 2016-03-14 DIAGNOSIS — E1140 Type 2 diabetes mellitus with diabetic neuropathy, unspecified: Secondary | ICD-10-CM | POA: Diagnosis not present

## 2016-03-14 DIAGNOSIS — I82432 Acute embolism and thrombosis of left popliteal vein: Secondary | ICD-10-CM | POA: Diagnosis not present

## 2016-03-15 DIAGNOSIS — I10 Essential (primary) hypertension: Secondary | ICD-10-CM | POA: Diagnosis not present

## 2016-03-15 DIAGNOSIS — Z79899 Other long term (current) drug therapy: Secondary | ICD-10-CM | POA: Diagnosis not present

## 2016-03-15 DIAGNOSIS — R740 Nonspecific elevation of levels of transaminase and lactic acid dehydrogenase [LDH]: Secondary | ICD-10-CM | POA: Diagnosis not present

## 2016-03-15 DIAGNOSIS — E1142 Type 2 diabetes mellitus with diabetic polyneuropathy: Secondary | ICD-10-CM | POA: Diagnosis not present

## 2016-03-15 DIAGNOSIS — K838 Other specified diseases of biliary tract: Secondary | ICD-10-CM | POA: Diagnosis not present

## 2016-03-19 DIAGNOSIS — I82432 Acute embolism and thrombosis of left popliteal vein: Secondary | ICD-10-CM | POA: Diagnosis not present

## 2016-03-19 DIAGNOSIS — I82412 Acute embolism and thrombosis of left femoral vein: Secondary | ICD-10-CM | POA: Diagnosis not present

## 2016-03-19 DIAGNOSIS — I82492 Acute embolism and thrombosis of other specified deep vein of left lower extremity: Secondary | ICD-10-CM | POA: Diagnosis not present

## 2016-03-19 DIAGNOSIS — E1140 Type 2 diabetes mellitus with diabetic neuropathy, unspecified: Secondary | ICD-10-CM | POA: Diagnosis not present

## 2016-03-19 DIAGNOSIS — I82442 Acute embolism and thrombosis of left tibial vein: Secondary | ICD-10-CM | POA: Diagnosis not present

## 2016-03-19 DIAGNOSIS — N390 Urinary tract infection, site not specified: Secondary | ICD-10-CM | POA: Diagnosis not present

## 2016-03-22 DIAGNOSIS — I82442 Acute embolism and thrombosis of left tibial vein: Secondary | ICD-10-CM | POA: Diagnosis not present

## 2016-03-22 DIAGNOSIS — N390 Urinary tract infection, site not specified: Secondary | ICD-10-CM | POA: Diagnosis not present

## 2016-03-22 DIAGNOSIS — I82412 Acute embolism and thrombosis of left femoral vein: Secondary | ICD-10-CM | POA: Diagnosis not present

## 2016-03-22 DIAGNOSIS — I82432 Acute embolism and thrombosis of left popliteal vein: Secondary | ICD-10-CM | POA: Diagnosis not present

## 2016-03-22 DIAGNOSIS — E1140 Type 2 diabetes mellitus with diabetic neuropathy, unspecified: Secondary | ICD-10-CM | POA: Diagnosis not present

## 2016-03-22 DIAGNOSIS — I82492 Acute embolism and thrombosis of other specified deep vein of left lower extremity: Secondary | ICD-10-CM | POA: Diagnosis not present

## 2016-03-23 ENCOUNTER — Other Ambulatory Visit: Payer: Self-pay

## 2016-03-23 NOTE — Patient Outreach (Signed)
[facility]) Care Management  03/23/2016  [name] April 11, 1961 [id number]   Telephone call to patient regarding primary MD referral. Unable to reach patient. HIPAA compliant voice message left with call back phone number.  PLAN:  RNCM will attempt 2nd telephone call to patient within  1 week.   [name] RN,BSN,CCM [facility] Telephonic  [phone]

## 2016-03-27 ENCOUNTER — Other Ambulatory Visit: Payer: Self-pay

## 2016-03-27 DIAGNOSIS — I82442 Acute embolism and thrombosis of left tibial vein: Secondary | ICD-10-CM | POA: Diagnosis not present

## 2016-03-27 DIAGNOSIS — I82492 Acute embolism and thrombosis of other specified deep vein of left lower extremity: Secondary | ICD-10-CM | POA: Diagnosis not present

## 2016-03-27 DIAGNOSIS — E1140 Type 2 diabetes mellitus with diabetic neuropathy, unspecified: Secondary | ICD-10-CM | POA: Diagnosis not present

## 2016-03-27 DIAGNOSIS — N390 Urinary tract infection, site not specified: Secondary | ICD-10-CM | POA: Diagnosis not present

## 2016-03-27 DIAGNOSIS — I82412 Acute embolism and thrombosis of left femoral vein: Secondary | ICD-10-CM | POA: Diagnosis not present

## 2016-03-27 DIAGNOSIS — I82432 Acute embolism and thrombosis of left popliteal vein: Secondary | ICD-10-CM | POA: Diagnosis not present

## 2016-03-27 NOTE — Patient Outreach (Signed)
[facility]) Care Management  03/27/2016  [name] 16-Jul-1961 [id number]  REFERRAL SOURCE:  Primary MD referral' REFERRAL REASON:  Patient needs nurse and social worker. Patient is frail, significant malnutrition, limited support and high fall risk.   Telephone call to patient regarding primary MD referral.  Patient states she is having physical therapy currently and requested call back.  PLAN: RNCM will attempt 3rd telephone call to patient within 1 week.   [name] RN,BSN,CCM [facility] Telephonic  [phone]

## 2016-03-29 DIAGNOSIS — I82492 Acute embolism and thrombosis of other specified deep vein of left lower extremity: Secondary | ICD-10-CM | POA: Diagnosis not present

## 2016-03-29 DIAGNOSIS — E1140 Type 2 diabetes mellitus with diabetic neuropathy, unspecified: Secondary | ICD-10-CM | POA: Diagnosis not present

## 2016-03-29 DIAGNOSIS — N390 Urinary tract infection, site not specified: Secondary | ICD-10-CM | POA: Diagnosis not present

## 2016-03-29 DIAGNOSIS — I82432 Acute embolism and thrombosis of left popliteal vein: Secondary | ICD-10-CM | POA: Diagnosis not present

## 2016-03-29 DIAGNOSIS — I82412 Acute embolism and thrombosis of left femoral vein: Secondary | ICD-10-CM | POA: Diagnosis not present

## 2016-03-29 DIAGNOSIS — I82442 Acute embolism and thrombosis of left tibial vein: Secondary | ICD-10-CM | POA: Diagnosis not present

## 2016-03-30 ENCOUNTER — Other Ambulatory Visit: Payer: Self-pay

## 2016-03-30 NOTE — Patient Outreach (Signed)
[facility]) Care Management  03/30/2016  [name] Aug 22, 1961 [id number]   Third telephone call to patient regarding primary MD referral.  Unable to reach patient . HIPAA compliant voice message left with patient requesting return call.  RNCM contacted patients primary MD office and left message for Robyn.  Requested assistance with engaging patient to [facility] care management services.   PLAN;  RNCM will send patient outreach letter to attempt contact.   [name] RN,BSN,CCM [facility] Telephonic  [phone]

## 2016-04-03 DIAGNOSIS — I82492 Acute embolism and thrombosis of other specified deep vein of left lower extremity: Secondary | ICD-10-CM | POA: Diagnosis not present

## 2016-04-03 DIAGNOSIS — N390 Urinary tract infection, site not specified: Secondary | ICD-10-CM | POA: Diagnosis not present

## 2016-04-03 DIAGNOSIS — I82442 Acute embolism and thrombosis of left tibial vein: Secondary | ICD-10-CM | POA: Diagnosis not present

## 2016-04-03 DIAGNOSIS — E1140 Type 2 diabetes mellitus with diabetic neuropathy, unspecified: Secondary | ICD-10-CM | POA: Diagnosis not present

## 2016-04-03 DIAGNOSIS — I82412 Acute embolism and thrombosis of left femoral vein: Secondary | ICD-10-CM | POA: Diagnosis not present

## 2016-04-03 DIAGNOSIS — I82432 Acute embolism and thrombosis of left popliteal vein: Secondary | ICD-10-CM | POA: Diagnosis not present

## 2016-04-05 ENCOUNTER — Telehealth: Payer: Self-pay | Admitting: *Deleted

## 2016-04-05 DIAGNOSIS — I82412 Acute embolism and thrombosis of left femoral vein: Secondary | ICD-10-CM | POA: Diagnosis not present

## 2016-04-05 DIAGNOSIS — I82492 Acute embolism and thrombosis of other specified deep vein of left lower extremity: Secondary | ICD-10-CM | POA: Diagnosis not present

## 2016-04-05 DIAGNOSIS — I82442 Acute embolism and thrombosis of left tibial vein: Secondary | ICD-10-CM | POA: Diagnosis not present

## 2016-04-05 DIAGNOSIS — E1140 Type 2 diabetes mellitus with diabetic neuropathy, unspecified: Secondary | ICD-10-CM | POA: Diagnosis not present

## 2016-04-05 DIAGNOSIS — N390 Urinary tract infection, site not specified: Secondary | ICD-10-CM | POA: Diagnosis not present

## 2016-04-05 DIAGNOSIS — I82432 Acute embolism and thrombosis of left popliteal vein: Secondary | ICD-10-CM | POA: Diagnosis not present

## 2016-04-05 NOTE — Telephone Encounter (Signed)
I have called [name] several times over the past 3 mths to schedule her f/u appts LLE DVT Lysis and iliac vein stent. The patient has not returned my calls, I am now mailing a letter in attempt to get her scheduled.

## 2016-04-06 ENCOUNTER — Other Ambulatory Visit: Payer: Self-pay

## 2016-04-06 NOTE — Patient Outreach (Signed)
[facility]) Care Management  04/06/2016  [name] 03/30/1961 [id number]  REFERRAL SOURCE: Primary MD  REFERRAL REASON: Patient is very frail, significant malnutrition, limited support and high fall risk.  CONSENT:  Voice message received from [place] with Dr. Laverle Hobby office. Voice message stated [name] spoke with patient who denied need for nurse and requested social worker assistance with [facility] care management.  RNCM attempted to contact patient by phone.  Unable to reach patient. HIPAA compliant voice message left with call back phone number.  PLAN: outreach letter mailed to patient on 03/3016. Will continue to await response from patient within 10 business days of outreach letter being mailed. If no response with close patient to [facility] care management.   [name] RN,BSN,CCM [facility] Telephonic  [phone]

## 2016-04-08 DIAGNOSIS — I82412 Acute embolism and thrombosis of left femoral vein: Secondary | ICD-10-CM | POA: Diagnosis not present

## 2016-04-08 DIAGNOSIS — I82442 Acute embolism and thrombosis of left tibial vein: Secondary | ICD-10-CM | POA: Diagnosis not present

## 2016-04-08 DIAGNOSIS — I82432 Acute embolism and thrombosis of left popliteal vein: Secondary | ICD-10-CM | POA: Diagnosis not present

## 2016-04-08 DIAGNOSIS — I82492 Acute embolism and thrombosis of other specified deep vein of left lower extremity: Secondary | ICD-10-CM | POA: Diagnosis not present

## 2016-04-10 DIAGNOSIS — I82442 Acute embolism and thrombosis of left tibial vein: Secondary | ICD-10-CM | POA: Diagnosis not present

## 2016-04-10 DIAGNOSIS — E1140 Type 2 diabetes mellitus with diabetic neuropathy, unspecified: Secondary | ICD-10-CM | POA: Diagnosis not present

## 2016-04-10 DIAGNOSIS — I82412 Acute embolism and thrombosis of left femoral vein: Secondary | ICD-10-CM | POA: Diagnosis not present

## 2016-04-10 DIAGNOSIS — I82432 Acute embolism and thrombosis of left popliteal vein: Secondary | ICD-10-CM | POA: Diagnosis not present

## 2016-04-10 DIAGNOSIS — I82492 Acute embolism and thrombosis of other specified deep vein of left lower extremity: Secondary | ICD-10-CM | POA: Diagnosis not present

## 2016-04-10 DIAGNOSIS — N390 Urinary tract infection, site not specified: Secondary | ICD-10-CM | POA: Diagnosis not present

## 2016-04-12 DIAGNOSIS — I82432 Acute embolism and thrombosis of left popliteal vein: Secondary | ICD-10-CM | POA: Diagnosis not present

## 2016-04-12 DIAGNOSIS — E1140 Type 2 diabetes mellitus with diabetic neuropathy, unspecified: Secondary | ICD-10-CM | POA: Diagnosis not present

## 2016-04-12 DIAGNOSIS — I82412 Acute embolism and thrombosis of left femoral vein: Secondary | ICD-10-CM | POA: Diagnosis not present

## 2016-04-12 DIAGNOSIS — I82492 Acute embolism and thrombosis of other specified deep vein of left lower extremity: Secondary | ICD-10-CM | POA: Diagnosis not present

## 2016-04-12 DIAGNOSIS — N390 Urinary tract infection, site not specified: Secondary | ICD-10-CM | POA: Diagnosis not present

## 2016-04-12 DIAGNOSIS — I82442 Acute embolism and thrombosis of left tibial vein: Secondary | ICD-10-CM | POA: Diagnosis not present

## 2016-04-17 DIAGNOSIS — I82412 Acute embolism and thrombosis of left femoral vein: Secondary | ICD-10-CM | POA: Diagnosis not present

## 2016-04-17 DIAGNOSIS — I82442 Acute embolism and thrombosis of left tibial vein: Secondary | ICD-10-CM | POA: Diagnosis not present

## 2016-04-17 DIAGNOSIS — E1140 Type 2 diabetes mellitus with diabetic neuropathy, unspecified: Secondary | ICD-10-CM | POA: Diagnosis not present

## 2016-04-17 DIAGNOSIS — N390 Urinary tract infection, site not specified: Secondary | ICD-10-CM | POA: Diagnosis not present

## 2016-04-17 DIAGNOSIS — I82432 Acute embolism and thrombosis of left popliteal vein: Secondary | ICD-10-CM | POA: Diagnosis not present

## 2016-04-17 DIAGNOSIS — I82492 Acute embolism and thrombosis of other specified deep vein of left lower extremity: Secondary | ICD-10-CM | POA: Diagnosis not present

## 2016-04-20 ENCOUNTER — Other Ambulatory Visit: Payer: Self-pay

## 2016-04-20 NOTE — Patient Outreach (Signed)
[facility]) Care Management  04/20/2016  [name] Feb 14, 1961 [id number]  Unable to reach patient after attempted telephone calls and outreach letter.  PLAN; RNCM will refer patient to [name] to close due to inability to contact. RNCM will notify patients primary MD of closure.   [name] RN,BSN,CCM [facility] Telephonic  [phone]

## 2016-04-23 DIAGNOSIS — I82412 Acute embolism and thrombosis of left femoral vein: Secondary | ICD-10-CM | POA: Diagnosis not present

## 2016-04-23 DIAGNOSIS — N390 Urinary tract infection, site not specified: Secondary | ICD-10-CM | POA: Diagnosis not present

## 2016-04-23 DIAGNOSIS — E1140 Type 2 diabetes mellitus with diabetic neuropathy, unspecified: Secondary | ICD-10-CM | POA: Diagnosis not present

## 2016-04-23 DIAGNOSIS — I82492 Acute embolism and thrombosis of other specified deep vein of left lower extremity: Secondary | ICD-10-CM | POA: Diagnosis not present

## 2016-04-23 DIAGNOSIS — I82442 Acute embolism and thrombosis of left tibial vein: Secondary | ICD-10-CM | POA: Diagnosis not present

## 2016-04-23 DIAGNOSIS — I82432 Acute embolism and thrombosis of left popliteal vein: Secondary | ICD-10-CM | POA: Diagnosis not present

## 2016-04-26 DIAGNOSIS — I82442 Acute embolism and thrombosis of left tibial vein: Secondary | ICD-10-CM | POA: Diagnosis not present

## 2016-04-26 DIAGNOSIS — I82432 Acute embolism and thrombosis of left popliteal vein: Secondary | ICD-10-CM | POA: Diagnosis not present

## 2016-04-26 DIAGNOSIS — I82492 Acute embolism and thrombosis of other specified deep vein of left lower extremity: Secondary | ICD-10-CM | POA: Diagnosis not present

## 2016-04-26 DIAGNOSIS — I82412 Acute embolism and thrombosis of left femoral vein: Secondary | ICD-10-CM | POA: Diagnosis not present

## 2016-04-26 DIAGNOSIS — E1140 Type 2 diabetes mellitus with diabetic neuropathy, unspecified: Secondary | ICD-10-CM | POA: Diagnosis not present

## 2016-04-26 DIAGNOSIS — N390 Urinary tract infection, site not specified: Secondary | ICD-10-CM | POA: Diagnosis not present

## 2016-05-01 ENCOUNTER — Observation Stay (HOSPITAL_COMMUNITY)
Admission: EM | Admit: 2016-05-01 | Discharge: 2016-05-02 | Disposition: A | Discharge status: Home / Self Care | Payer: Medicare Other | Attending: Internal Medicine | Admitting: Internal Medicine

## 2016-05-01 ENCOUNTER — Encounter (HOSPITAL_COMMUNITY): Payer: Self-pay

## 2016-05-01 ENCOUNTER — Emergency Department (HOSPITAL_COMMUNITY): Payer: Medicare Other

## 2016-05-01 DIAGNOSIS — A419 Sepsis, unspecified organism: Secondary | ICD-10-CM

## 2016-05-01 DIAGNOSIS — I10 Essential (primary) hypertension: Secondary | ICD-10-CM | POA: Diagnosis not present

## 2016-05-01 DIAGNOSIS — I959 Hypotension, unspecified: Secondary | ICD-10-CM | POA: Diagnosis present

## 2016-05-01 DIAGNOSIS — E118 Type 2 diabetes mellitus with unspecified complications: Secondary | ICD-10-CM | POA: Diagnosis not present

## 2016-05-01 DIAGNOSIS — R7989 Other specified abnormal findings of blood chemistry: Secondary | ICD-10-CM | POA: Diagnosis not present

## 2016-05-01 DIAGNOSIS — D696 Thrombocytopenia, unspecified: Secondary | ICD-10-CM | POA: Diagnosis present

## 2016-05-01 DIAGNOSIS — I82492 Acute embolism and thrombosis of other specified deep vein of left lower extremity: Secondary | ICD-10-CM | POA: Diagnosis not present

## 2016-05-01 DIAGNOSIS — E860 Dehydration: Principal | ICD-10-CM | POA: Diagnosis present

## 2016-05-01 DIAGNOSIS — E1140 Type 2 diabetes mellitus with diabetic neuropathy, unspecified: Secondary | ICD-10-CM | POA: Diagnosis not present

## 2016-05-01 DIAGNOSIS — E039 Hypothyroidism, unspecified: Secondary | ICD-10-CM | POA: Diagnosis present

## 2016-05-01 DIAGNOSIS — I82432 Acute embolism and thrombosis of left popliteal vein: Secondary | ICD-10-CM | POA: Diagnosis not present

## 2016-05-01 DIAGNOSIS — N179 Acute kidney failure, unspecified: Secondary | ICD-10-CM | POA: Diagnosis present

## 2016-05-01 DIAGNOSIS — Z86711 Personal history of pulmonary embolism: Secondary | ICD-10-CM | POA: Diagnosis present

## 2016-05-01 DIAGNOSIS — R5381 Other malaise: Secondary | ICD-10-CM | POA: Diagnosis not present

## 2016-05-01 DIAGNOSIS — G629 Polyneuropathy, unspecified: Secondary | ICD-10-CM

## 2016-05-01 DIAGNOSIS — K219 Gastro-esophageal reflux disease without esophagitis: Secondary | ICD-10-CM | POA: Diagnosis present

## 2016-05-01 DIAGNOSIS — I82442 Acute embolism and thrombosis of left tibial vein: Secondary | ICD-10-CM | POA: Diagnosis not present

## 2016-05-01 DIAGNOSIS — N390 Urinary tract infection, site not specified: Secondary | ICD-10-CM | POA: Diagnosis not present

## 2016-05-01 DIAGNOSIS — I82412 Acute embolism and thrombosis of left femoral vein: Secondary | ICD-10-CM | POA: Diagnosis not present

## 2016-05-01 DIAGNOSIS — F17210 Nicotine dependence, cigarettes, uncomplicated: Secondary | ICD-10-CM | POA: Diagnosis not present

## 2016-05-01 DIAGNOSIS — R404 Transient alteration of awareness: Secondary | ICD-10-CM | POA: Diagnosis not present

## 2016-05-01 DIAGNOSIS — J45909 Unspecified asthma, uncomplicated: Secondary | ICD-10-CM | POA: Insufficient documentation

## 2016-05-01 DIAGNOSIS — G8929 Other chronic pain: Secondary | ICD-10-CM | POA: Diagnosis present

## 2016-05-01 DIAGNOSIS — E872 Acidosis: Secondary | ICD-10-CM | POA: Insufficient documentation

## 2016-05-01 DIAGNOSIS — R531 Weakness: Secondary | ICD-10-CM | POA: Diagnosis not present

## 2016-05-01 HISTORY — DX: Acute embolism and thrombosis of unspecified deep veins of unspecified lower extremity: I82.409

## 2016-05-01 HISTORY — DX: Other specified abnormal findings of blood chemistry: R79.89

## 2016-05-01 HISTORY — DX: Other chronic pain: G89.29

## 2016-05-01 HISTORY — DX: Unspecified osteoarthritis, unspecified site: M19.90

## 2016-05-01 HISTORY — DX: Low back pain: M54.5

## 2016-05-01 HISTORY — DX: Thrombocytopenia, unspecified: D69.6

## 2016-05-01 HISTORY — DX: Anxiety disorder, unspecified: F41.9

## 2016-05-01 HISTORY — DX: Low back pain, unspecified: M54.50

## 2016-05-01 LAB — I-STAT CG4 LACTIC ACID, ED
LACTIC ACID, VENOUS: 7.88 mmol/L — AB (ref 0.5–1.9)
Lactic Acid, Venous: 4.28 mmol/L (ref 0.5–1.9)

## 2016-05-01 LAB — COMPREHENSIVE METABOLIC PANEL
ALK PHOS: 129 U/L — AB (ref 38–126)
ALT: 26 U/L (ref 14–54)
AST: 77 U/L — AB (ref 15–41)
Albumin: 2.3 g/dL — ABNORMAL LOW (ref 3.5–5.0)
Anion gap: 16 — ABNORMAL HIGH (ref 5–15)
BILIRUBIN TOTAL: 1.1 mg/dL (ref 0.3–1.2)
CALCIUM: 7.8 mg/dL — AB (ref 8.9–10.3)
CO2: 18 mmol/L — ABNORMAL LOW (ref 22–32)
CREATININE: 1.12 mg/dL — AB (ref 0.44–1.00)
Chloride: 105 mmol/L (ref 101–111)
GFR calc Af Amer: >60 mL/min (ref >60)
GFR, EST NON AFRICAN AMERICAN: 53 mL/min — AB (ref >60)
Glucose, Bld: 142 mg/dL — ABNORMAL HIGH (ref 65–99)
POTASSIUM: 3.5 mmol/L (ref 3.5–5.1)
Sodium: 139 mmol/L (ref 135–145)
TOTAL PROTEIN: 5.1 g/dL — AB (ref 6.5–8.1)

## 2016-05-01 LAB — CBC
HEMATOCRIT: 39.1 % (ref 36.0–46.0)
Hemoglobin: 12.5 g/dL (ref 12.0–15.0)
MCH: 31.3 pg (ref 26.0–34.0)
MCHC: 32 g/dL (ref 30.0–36.0)
MCV: 97.8 fL (ref 78.0–100.0)
Platelets: 55 10*3/uL — ABNORMAL LOW (ref 150–400)
RBC: 4 MIL/uL (ref 3.87–5.11)
RDW: 18.1 % — AB (ref 11.5–15.5)
WBC: 9.2 10*3/uL (ref 4.0–10.5)

## 2016-05-01 LAB — GLUCOSE, CAPILLARY: Glucose-Capillary: 132 mg/dL — ABNORMAL HIGH (ref 65–99)

## 2016-05-01 LAB — LACTIC ACID, PLASMA: Lactic Acid, Venous: 4.1 mmol/L (ref 0.5–1.9)

## 2016-05-01 LAB — LIPASE, BLOOD: LIPASE: 11 U/L (ref 11–51)

## 2016-05-01 LAB — C DIFFICILE QUICK SCREEN W PCR REFLEX
C Diff antigen: NEGATIVE
C Diff interpretation: NOT DETECTED
C Diff toxin: NEGATIVE

## 2016-05-01 LAB — TSH: TSH: 1.021 u[IU]/mL (ref 0.350–4.500)

## 2016-05-01 MED ORDER — POLYETHYLENE GLYCOL 3350 17 G PO PACK: 17.0000 g | PACK | Freq: Every day | ORAL | Status: DC

## 2016-05-01 MED ORDER — DOCUSATE SODIUM 100 MG PO CAPS: 200.0000 mg | ORAL_CAPSULE | Freq: Two times a day (BID) | ORAL | Status: DC

## 2016-05-01 MED ORDER — TRAZODONE HCL 50 MG PO TABS
25.0000 mg | ORAL_TABLET | Freq: Every evening | ORAL | Status: DC | PRN
  Administered 2016-05-01: 25 mg via ORAL
  Filled 2016-05-01: qty 1

## 2016-05-01 MED ORDER — HYDROCODONE-ACETAMINOPHEN 5-325 MG PO TABS
1.0000 | ORAL_TABLET | Freq: Once | ORAL | Status: AC
  Administered 2016-05-01: 1 via ORAL
  Filled 2016-05-01: qty 1

## 2016-05-01 MED ORDER — GABAPENTIN 100 MG PO CAPS
100.0000 mg | ORAL_CAPSULE | Freq: Two times a day (BID) | ORAL | Status: DC
  Administered 2016-05-01 – 2016-05-02 (×2): 100 mg via ORAL
  Filled 2016-05-01 (×2): qty 1

## 2016-05-01 MED ORDER — SODIUM CHLORIDE 0.9 % IV BOLUS (SEPSIS)
250.0000 mL | Freq: Once | INTRAVENOUS | Status: AC
  Administered 2016-05-02: 250 mL via INTRAVENOUS

## 2016-05-01 MED ORDER — SODIUM CHLORIDE 0.9 % IV SOLN
INTRAVENOUS | Status: AC
  Administered 2016-05-01 – 2016-05-02 (×2): via INTRAVENOUS

## 2016-05-01 MED ORDER — ONDANSETRON HCL 4 MG/2ML IJ SOLN
4.0000 mg | Freq: Once | INTRAMUSCULAR | Status: AC
  Administered 2016-05-01: 4 mg via INTRAVENOUS
  Filled 2016-05-01: qty 2

## 2016-05-01 MED ORDER — SACCHAROMYCES BOULARDII 250 MG PO CAPS
250.0000 mg | ORAL_CAPSULE | Freq: Two times a day (BID) | ORAL | Status: DC
  Administered 2016-05-01 – 2016-05-02 (×2): 250 mg via ORAL
  Filled 2016-05-01 (×2): qty 1

## 2016-05-01 MED ORDER — ACETAMINOPHEN 325 MG PO TABS: 650.0000 mg | ORAL_TABLET | Freq: Four times a day (QID) | ORAL | Status: DC | PRN

## 2016-05-01 MED ORDER — FOLIC ACID 1 MG PO TABS
1.0000 mg | ORAL_TABLET | Freq: Every day | ORAL | Status: DC
  Administered 2016-05-02: 1 mg via ORAL
  Filled 2016-05-01 (×2): qty 1

## 2016-05-01 MED ORDER — VITAMIN B-1 100 MG PO TABS
100.0000 mg | ORAL_TABLET | Freq: Every day | ORAL | Status: DC
  Filled 2016-05-01: qty 1

## 2016-05-01 MED ORDER — ACETAMINOPHEN 650 MG RE SUPP: 650.0000 mg | Freq: Four times a day (QID) | RECTAL | Status: DC | PRN

## 2016-05-01 MED ORDER — PANTOPRAZOLE SODIUM 40 MG PO TBEC
40.0000 mg | DELAYED_RELEASE_TABLET | Freq: Every day | ORAL | Status: DC
  Administered 2016-05-01 – 2016-05-02 (×2): 40 mg via ORAL
  Filled 2016-05-01 (×2): qty 1

## 2016-05-01 MED ORDER — SODIUM CHLORIDE 0.9 % IV BOLUS (SEPSIS)
500.0000 mL | Freq: Once | INTRAVENOUS | Status: AC
  Administered 2016-05-01: 500 mL via INTRAVENOUS

## 2016-05-01 MED ORDER — HYDROMORPHONE HCL 2 MG PO TABS: 8.0000 mg | ORAL_TABLET | ORAL | Status: DC | PRN

## 2016-05-01 MED ORDER — MECLIZINE HCL 25 MG PO TABS: 25.0000 mg | ORAL_TABLET | Freq: Three times a day (TID) | ORAL | Status: DC | PRN

## 2016-05-01 MED ORDER — INSULIN ASPART 100 UNIT/ML ~~LOC~~ SOLN
0.0000 [IU] | Freq: Three times a day (TID) | SUBCUTANEOUS | Status: DC
  Administered 2016-05-02: 1 [IU] via SUBCUTANEOUS

## 2016-05-01 MED ORDER — APIXABAN 5 MG PO TABS
5.0000 mg | ORAL_TABLET | Freq: Two times a day (BID) | ORAL | Status: DC
  Administered 2016-05-01 – 2016-05-02 (×2): 5 mg via ORAL
  Filled 2016-05-01 (×2): qty 1

## 2016-05-01 MED ORDER — LEVOTHYROXINE SODIUM 25 MCG PO TABS
25.0000 ug | ORAL_TABLET | Freq: Every day | ORAL | Status: DC
  Filled 2016-05-01: qty 1

## 2016-05-01 MED ORDER — SODIUM CHLORIDE 0.9 % IV BOLUS (SEPSIS)
1000.0000 mL | Freq: Once | INTRAVENOUS | Status: AC
Start: 2016-05-01 — End: 2016-05-01
  Administered 2016-05-01: 1000 mL via INTRAVENOUS

## 2016-05-01 MED ORDER — APIXABAN 5 MG PO TABS: 10.0000 mg | ORAL_TABLET | Freq: Two times a day (BID) | ORAL | Status: DC

## 2016-05-01 MED ORDER — LOPERAMIDE HCL 2 MG PO CAPS
2.0000 mg | ORAL_CAPSULE | Freq: Once | ORAL | Status: AC
  Administered 2016-05-01: 2 mg via ORAL
  Filled 2016-05-01: qty 1

## 2016-05-01 MED ORDER — INSULIN ASPART 100 UNIT/ML ~~LOC~~ SOLN: 0.0000 [IU] | Freq: Every day | SUBCUTANEOUS | Status: DC

## 2016-05-01 MED ORDER — METHOCARBAMOL 500 MG PO TABS
500.0000 mg | ORAL_TABLET | Freq: Once | ORAL | Status: AC
Start: 2016-05-01 — End: 2016-05-01
  Administered 2016-05-01: 500 mg via ORAL
  Filled 2016-05-01: qty 1

## 2016-05-01 MED ORDER — HYDROCODONE-ACETAMINOPHEN 5-325 MG PO TABS
1.0000 | ORAL_TABLET | Freq: Four times a day (QID) | ORAL | Status: DC | PRN
  Administered 2016-05-01 – 2016-05-02 (×3): 1 via ORAL
  Filled 2016-05-01 (×4): qty 1

## 2016-05-01 MED ORDER — ONDANSETRON 4 MG PO TBDP
4.0000 mg | ORAL_TABLET | Freq: Three times a day (TID) | ORAL | Status: DC | PRN
  Administered 2016-05-02: 4 mg via ORAL
  Filled 2016-05-01: qty 1

## 2016-05-01 MED ORDER — BOOST / RESOURCE BREEZE PO LIQD
1.0000 | Freq: Three times a day (TID) | ORAL | Status: DC
  Administered 2016-05-01 – 2016-05-02 (×2): 1 via ORAL

## 2016-05-01 MED ORDER — CALCIUM CARBONATE 1250 (500 CA) MG PO TABS
1.0000 | ORAL_TABLET | Freq: Every day | ORAL | Status: DC
  Filled 2016-05-01: qty 1

## 2016-05-01 MED ORDER — SODIUM CHLORIDE 0.9 % IV BOLUS (SEPSIS)
250.0000 mL | Freq: Once | INTRAVENOUS | Status: AC
  Administered 2016-05-01: 250 mL via INTRAVENOUS

## 2016-05-01 MED ORDER — SODIUM CHLORIDE 0.9 % IV BOLUS (SEPSIS)
1000.0000 mL | Freq: Once | INTRAVENOUS | Status: AC
  Administered 2016-05-01: 1000 mL via INTRAVENOUS

## 2016-05-01 MED ORDER — VENLAFAXINE HCL 25 MG PO TABS
25.0000 mg | ORAL_TABLET | Freq: Two times a day (BID) | ORAL | Status: DC
  Administered 2016-05-01 – 2016-05-02 (×2): 25 mg via ORAL
  Filled 2016-05-01 (×3): qty 1

## 2016-05-01 NOTE — ED Provider Notes (Signed)
[facility]-EMERGENCY DEPT Provider Note   CSN: [id number] Arrival date & time: 05/01/16  1214  First Provider Contact:  First MD Initiated Contact with Patient 05/01/16 1226        History   Chief Complaint Chief Complaint  Patient presents with  . Hypotension    HPI [name] is a 55 y.o. female.   Illness  This is a new problem. The current episode started 3 to 5 hours ago. The problem occurs rarely. The problem has not changed since onset.Pertinent negatives include no chest pain, no abdominal pain and no shortness of breath. Exacerbated by: activity. The symptoms are relieved by rest. She has tried rest for the symptoms. The treatment provided mild relief.  Patient presents with a few days of worsening malaise and generalized weakness. Today during her physical therapy she had her vitals checked and therapist was concerned that her blood pressure was too low to be readable, even after multiple attempts. Patient reports that she had an episode of NBNB emesis this morning, however states that she has not had problems tolerating by mouth during the past few days. Denies any fevers or night sweats does report having chills. No change in productive her cough is. Denies any chest pain or shortness of breath. Appears to be incontinent at baseline but denies any dysuria. Denies any significant GI symptoms as well; if anything, reports having been constipated. No bleeding noted from her nose/sputum/urine/rectum/vagina.  Past Medical History:  Diagnosis Date  . Asthma   . Asthma   . C. difficile colitis   . Crohn disease (HCC)   . Diabetes mellitus without complication (HCC)   . GERD (gastroesophageal reflux disease)   . Glaucoma   . HLD (hyperlipidemia)   . Hypertension   . Hypothyroidism   . Neuropathy (HCC)   . Pancreatitis   . Pulmonary embolus (HCC) 05/24/2014  . Tobacco abuse     Patient Active Problem List   Diagnosis Date Noted  . Abdominal pain, generalized   . Abdominal  pain   . AKI (acute kidney injury) (HCC)   . Sepsis (HCC)   . UTI (lower urinary tract infection) 02/28/2016  . 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 Excoriated rash-medial thighs 02/28/2016  . DVT (deep venous thrombosis), unspecified laterality 01/29/2016  . Bilateral tibial fractures   . Hypothyroidism   . GERD (gastroesophageal reflux disease)   . HLD (hyperlipidemia)   . Enteritis due to Clostridium difficile 03/23/2015  . Weakness of back 03/21/2015  . Chronic pain 02/28/2015  . Severe protein-calorie malnutrition (HCC) 12/30/2014  . Hepatic steatosis 12/29/2014  . Chronic diarrhea of unknown origin 12/29/2014  . Dehydration 07/26/2014  . History of pulmonary embolus (PE) 05/24/2014  . Physical deconditioning 05/14/2014  . Nicotine abuse 05/08/2014  . Normocytic anemia 10/15/2012  . Metabolic acidosis 10/14/2012  . DM2 (diabetes mellitus, type 2) (HCC) 05/12/2007  . Anxiety state 05/12/2007  . Depression 05/12/2007  . Peripheral neuropathy (HCC) 05/12/2007  . HTN (hypertension) 05/12/2007  . ALLERGIC RHINITIS 05/12/2007  . Asthma 05/12/2007  . PANCREATITIS, HX OF 05/12/2007    Past Surgical History:  Procedure Laterality Date  . CESAREAN SECTION    . COLONOSCOPY N/A 03/02/2015   Procedure: COLONOSCOPY;  Surgeon: Bernette Redbird, MD;  Location: [facility] ENDOSCOPY;  Service: Endoscopy;  Laterality: N/A;  . ESOPHAGOGASTRODUODENOSCOPY N/A 05/21/2014   Procedure: ESOPHAGOGASTRODUODENOSCOPY (EGD);  Surgeon: Petra Kuba, MD;  Location: [facility]OSCOPY;  Service: Endoscopy;  Laterality: N/A;  . ESOPHAGOGASTRODUODENOSCOPY N/A 03/02/2015  Procedure: ESOPHAGOGASTRODUODENOSCOPY (EGD);  Surgeon: Bernette Redbird, MD;  Location: [facility] ENDOSCOPY;  Service: Endoscopy;  Laterality: N/A;    OB History    No data available       Home Medications    Prior to Admission medications   Medication Sig Start Date End Date Taking? Authorizing Provider  apixaban (ELIQUIS) 5 MG TABS tablet Take 2 tablets  (10 mg total) by mouth 2 (two) times daily. 03/06/16   Richarda Overlie, MD  calcium carbonate (OS-CAL - DOSED IN MG OF ELEMENTAL CALCIUM) 1250 (500 CA) MG tablet Take 1 tablet (500 mg of elemental calcium total) by mouth daily with breakfast. 03/04/15   Alison Murray, MD  docusate sodium (COLACE) 100 MG capsule Take 2 capsules (200 mg total) by mouth 2 (two) times daily. 02/07/16   Leroy Sea, MD  feeding supplement, RESOURCE BREEZE, (RESOURCE BREEZE) LIQD Take 1 Container by mouth 3 (three) times daily between meals. 08/02/14   Kathlen Mody, MD  folic acid (FOLVITE) 1 MG tablet Take 1 tablet (1 mg total) by mouth daily. 03/06/16   Richarda Overlie, MD  gabapentin (NEURONTIN) 100 MG capsule Take 1 capsule (100 mg total) by mouth 2 (two) times daily. 03/06/16   Richarda Overlie, MD  HYDROcodone-acetaminophen (NORCO/VICODIN) 5-325 MG tablet Take 1 tablet by mouth every 6 (six) hours as needed. Patient taking differently: Take 1 tablet by mouth every 6 (six) hours as needed for moderate pain.  01/09/16   Derwood Kaplan, MD  HYDROmorphone (DILAUDID) 4 MG tablet Take 8 mg by mouth every 4 (four) hours as needed for severe pain.    Historical Provider, MD  levothyroxine (SYNTHROID, LEVOTHROID) 25 MCG tablet Take 1 tablet (25 mcg total) by mouth daily before breakfast. 03/06/16   Richarda Overlie, MD  loperamide (IMODIUM) 2 MG capsule Take 1 capsule (2 mg total) by mouth 2 (two) times daily as needed for diarrhea or loose stools. 02/08/16   Leana Roe Elgergawy, MD  meclizine (ANTIVERT) 25 MG tablet Take 25 mg by mouth 3 (three) times daily as needed for dizziness.    Historical Provider, MD  ondansetron (ZOFRAN ODT) 4 MG disintegrating tablet Take 1 tablet (4 mg total) by mouth every 8 (eight) hours as needed for nausea or vomiting. 01/09/16   Derwood Kaplan, MD  pantoprazole (PROTONIX) 20 MG tablet Take 2 tablets (40 mg total) by mouth daily. 03/06/16   Richarda Overlie, MD  polyethylene glycol (MIRALAX / GLYCOLAX) packet Take 17 g by  mouth daily. 02/07/16   Leroy Sea, MD  saccharomyces boulardii (FLORASTOR) 250 MG capsule Take 1 capsule (250 mg total) by mouth 2 (two) times daily. 04/05/15   Vassie Loll, MD  thiamine (VITAMIN B-1) 100 MG tablet Take 1 tablet (100 mg total) by mouth daily. 03/06/16   Richarda Overlie, MD  traZODone (DESYREL) 25 mg TABS tablet Take 0.5 tablets (25 mg total) by mouth at bedtime as needed for sleep. 08/02/14   Kathlen Mody, MD  venlafaxine (EFFEXOR) 25 MG tablet Take 1 tablet (25 mg total) by mouth 2 (two) times daily. 03/04/15   Alison Murray, MD    Family History Family History  Problem Relation Age of Onset  . Breast cancer Mother   . Heart disease Mother 21    CABG  . Diabetes Mother   . Heart disease Father 12    CABG  . Diabetes Father     Social History Social History  Substance Use Topics  . Smoking status:  Current Every Day Smoker    Packs/day: 0.25    Years: 26.00    Types: Cigarettes  . Smokeless tobacco: Never Used  . Alcohol use No     Allergies   Iohexol; Spiriva handihaler [tiotropium bromide monohydrate]; Iodinated diagnostic agents; Ativan [lorazepam]; and Penicillins   Review of Systems Review of Systems  Constitutional: Negative for chills, diaphoresis and fever.  HENT: Negative for sore throat.   Eyes: Negative for discharge.  Respiratory: Positive for cough (chronic). Negative for shortness of breath.   Cardiovascular: Negative for chest pain.  Gastrointestinal: Positive for diarrhea and vomiting (x1 due to increased mucous production). Negative for abdominal pain, blood in stool and nausea.  Genitourinary: Negative for dysuria.       History of episodes of incontinence on chart review.  Musculoskeletal: Positive for arthralgias (in bilat knee, chronic) and back pain (chronic).  Skin: Negative for rash.  Neurological: Positive for light-headedness. Negative for syncope.  Psychiatric/Behavioral: Negative for agitation and confusion.  All other systems  reviewed and are negative.    Physical Exam Updated Vital Signs BP 94/81 (BP Location: Left Arm)   Pulse 120   Temp 98.3 F (36.8 C) (Oral)   Resp 18   LMP 10/01/2010 Comment: Has depro  Physical Exam  Constitutional: She is oriented to person, place, and time. She appears well-developed and well-nourished. No distress.  HENT:  Head: Normocephalic and atraumatic.  Mouth/Throat: Mucous membranes are dry.  Eyes: Conjunctivae are normal. Right eye exhibits no discharge. Left eye exhibits no discharge.  Neck: Normal range of motion. Neck supple. No tracheal deviation present.  Cardiovascular: Normal rate, regular rhythm and normal heart sounds.   No murmur heard. Faint distal pulses  Pulmonary/Chest: Effort normal and breath sounds normal. No stridor. No respiratory distress. She has no wheezes. She has no rales.  Abdominal: Soft. Bowel sounds are normal. She exhibits no distension. There is no tenderness. There is no rebound and no guarding.  Musculoskeletal: She exhibits no edema or tenderness.  Neurological: She is alert and oriented to person, place, and time.  Skin: Skin is warm and dry. Capillary refill takes less than 2 seconds. She is not diaphoretic.  Psychiatric: She has a normal mood and affect. Her behavior is normal. Thought content normal.  Nursing note and vitals reviewed.    ED Treatments / Results  Labs (all labs ordered are listed, but only abnormal results are displayed) Labs Reviewed  CBC - Abnormal; Notable for the following:       Result Value   RDW 18.1 (*)    Platelets 55 (*)    All other components within normal limits  COMPREHENSIVE METABOLIC PANEL - Abnormal; Notable for the following:    CO2 18 (*)    Glucose, Bld 142 (*)    BUN <5 (*)    Creatinine, Ser 1.12 (*)    Calcium 7.8 (*)    Total Protein 5.1 (*)    Albumin 2.3 (*)    AST 77 (*)    Alkaline Phosphatase 129 (*)    GFR calc non Af Amer 53 (*)    Anion gap 16 (*)    All other  components within normal limits  LACTIC ACID, PLASMA - Abnormal; Notable for the following:    Lactic Acid, Venous 4.1 (*)    All other components within normal limits  GLUCOSE, CAPILLARY - Abnormal; Notable for the following:    Glucose-Capillary 132 (*)    All other components within normal  limits  I-STAT CG4 LACTIC ACID, ED - Abnormal; Notable for the following:    Lactic Acid, Venous 7.88 (*)    All other components within normal limits  I-STAT CG4 LACTIC ACID, ED - Abnormal; Notable for the following:    Lactic Acid, Venous 4.28 (*)    All other components within normal limits  C DIFFICILE QUICK SCREEN W PCR REFLEX  CULTURE, BLOOD (ROUTINE X 2)  CULTURE, BLOOD (ROUTINE X 2)  URINE CULTURE  LIPASE, BLOOD  TSH  URINALYSIS, ROUTINE W REFLEX MICROSCOPIC (NOT AT [facility])  BASIC METABOLIC PANEL  CBC  I-STAT CG4 LACTIC ACID, ED  I-STAT CG4 LACTIC ACID, ED    EKG  EKG Interpretation  Date/Time:  Tuesday May 01 2016 12:19:44 EDT Ventricular Rate:  132 PR Interval:    QRS Duration: 72 QT Interval:  323 QTC Calculation: 479 R Axis:   52 Text Interpretation:  Sinus tachycardia with irregular rate Consider right atrial enlargement Anterior infarct, old Nonspecific T abnormalities, lateral leads T wave inversion RESOLVED SINCE PREVIOUS Confirmed by Anitra Lauth  MD, WHITNEY ([id number]) on 05/01/2016 12:27:53 PM       Radiology Dg Chest Port 1 View  Result Date: 05/01/2016 CLINICAL DATA:  Sepsis EXAM: PORTABLE CHEST 1 VIEW COMPARISON:  Feb 28, 2016 FINDINGS: There is no edema or consolidation. The heart size and pulmonary vascularity are normal. No adenopathy. No bone lesions. IMPRESSION: No edema or consolidation. Electronically Signed   By: Bretta Bang III M.D.   On: 05/01/2016 15:28    Procedures Procedures (including critical care time)  Medications Ordered in ED Medications - No data to display   Initial Impression / Assessment and Plan / ED Course  I have reviewed the  triage vital signs and the nursing notes.  Pertinent labs & imaging results that were available during my care of the patient were reviewed by me and considered in my medical decision making (see chart for details).  Clinical Course   Patient presents with findings concerning for sepsis with elevated heart rate and elevated respiratory rate. On examination, patient does not appear to have a clear source of infection, including having no respiratory symptoms, urinary symptoms, GI symptoms. Basic blood work and lactic acid did not show leukocytosis, however did have a lactic acid elevated to 7.4. Patient also had an elevated BUN to creatinine ratio. Chest x-ray was unremarkable and urine studies are pending. C diff negative. If this patient has any anomalies on UA patient will meet criteria for severe sepsis. If not, lab abnormalities are likely due to dehydration. Patient responded well to have 1.5 L normal saline bolus with a decrease in her lactic acid to the 4.28--1.5 L bolus corresponded to a 30 mL/kg bolus. Due to absence of infectious symptoms, including urinary symptoms--decision was made not to cover the patient empirically with antibiotics. Patient had good response to fluids with regards to her blood pressure and her tachycardia. Patient was restarted on her medications for her chronic pain. Discussed cased with admitting team who agreed to place her in observation and reassess her after her lactic acid had normalized. Patient was mentating well throughout stay in the emergency Department and provided her entire history without difficulty.  Final Clinical Impressions(s) / ED Diagnoses   Final diagnoses:  Dehydration Lactic acidoses Pre-renal azotemia    New Prescriptions New Prescriptions   No medications on file     Maretta Bees, MD 05/01/16 2210    Gwyneth Sprout, MD 05/03/16 0930

## 2016-05-01 NOTE — H&P (Signed)
History and Physical    RITHIKA SEEL ZOX:[id number] DOB: [birth year] DOA: 05/01/2016  PCP: Georgann Housekeeper, MD Patient coming from: home  Chief Complaint: hypotension/weakness  HPI: [name] is a 55 y.o. 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 Initial evaluation reveals metabolic acidosis with an elevated lactic acid and hypotension with tachycardia.  Information is obtained from the patient. She reports she was about to start her usual physical therapy session she felt weak. Staff attempted to get a blood pressure" they couldn't get a blood pressure". Associated symptoms include 2 episodes of nausea with vomiting, headache generalized weakness. She denies any coffee ground emesis states "I had mucus on my stomach". She denies chest pain palpitation visual disturbances numbness tingling of extremities. She does report a worsening chronic pain "all over my body". She denies dysuria hematuria frequency or urgency. She does report one episode of loose stool during her stay in the emergency department but was glad for it since "I hadn't had a bowel movement in 7 days". He denies melena or bright red blood per rectum. She reports eating and drinking her normal amount and request food at the point of admission.    ED Course: In the emergency department and asked temp 99.8 blood pressure 87/66 heart rate 108 and saturation level 100% on room air. He is provided with 1.5 L of normal saline for elevated lactic acid.  Review of Systems: As per HPI otherwise 10 point review of systems negative.   Ambulatory Status: Ambulates independently with a somewhat unsteady gait no recent falls  Past Medical History:  Diagnosis Date  . Asthma    . Asthma   . C. difficile colitis   . Crohn disease (HCC)   . Diabetes mellitus without complication (HCC)   . Elevated lactic acid level   . GERD (gastroesophageal reflux disease)   . Glaucoma   . HLD (hyperlipidemia)   . Hypertension   . Hypothyroidism   . Neuropathy (HCC)   . Pancreatitis   . Pulmonary embolus (HCC) 05/24/2014  . Thrombocytopenia (HCC)   . Tobacco abuse     Past Surgical History:  Procedure Laterality Date  . CESAREAN SECTION    . COLONOSCOPY N/A 03/02/2015   Procedure: COLONOSCOPY;  Surgeon: Bernette Redbird, MD;  Location: [facility] ENDOSCOPY;  Service: Endoscopy;  Laterality: N/A;  . ESOPHAGOGASTRODUODENOSCOPY N/A 05/21/2014   Procedure: ESOPHAGOGASTRODUODENOSCOPY (EGD);  Surgeon: Petra Kuba, MD;  Location: [facility] ENDOSCOPY;  Service: Endoscopy;  Laterality: N/A;  . ESOPHAGOGASTRODUODENOSCOPY N/A 03/02/2015   Procedure: ESOPHAGOGASTRODUODENOSCOPY (EGD);  Surgeon: Bernette Redbird, MD;  Location: [facility] ENDOSCOPY;  Service: Endoscopy;  Laterality: N/A;    Social History   Social History  . Marital status: Divorced    Spouse name: N/A  . Number of children: N/A  . Years of education: N/A   Occupational History  . Not on file.   Social History Main Topics  . Smoking status: Current Every Day Smoker    Packs/day: 0.25    Years: 26.00    Types: Cigarettes  . Smokeless tobacco: Never Used  . Alcohol use No  . Drug use: No  . Sexual activity: Not on file   Other Topics Concern  . Not on file   Social History Narrative  . No narrative on file  Allergies  Allergen Reactions  . Iohexol Other (See Comments)    "severe burning" Patient has received Contrast in 2005 with 13 hour pre-medication, and had no reaction at that time  . Spiriva Handihaler [Tiotropium Bromide Monohydrate] Nausea And Vomiting  . Iodinated Diagnostic Agents Other (See Comments)  . Ativan [Lorazepam] Other (See Comments)    hallucinations  . Penicillins Hives    Has had cephalosporins      Family History  Problem Relation Age of Onset  . Breast cancer Mother   . Heart disease Mother 34    CABG  . Diabetes Mother   . Heart disease Father 74    CABG  . Diabetes Father     Prior to Admission medications   Medication Sig Start Date End Date Taking? Authorizing Provider  apixaban (ELIQUIS) 5 MG TABS tablet Take 2 tablets (10 mg total) by mouth 2 (two) times daily. 03/06/16  Yes Richarda Overlie, MD  calcium carbonate (OS-CAL - DOSED IN MG OF ELEMENTAL CALCIUM) 1250 (500 CA) MG tablet Take 1 tablet (500 mg of elemental calcium total) by mouth daily with breakfast. 03/04/15  Yes Alison Murray, MD  docusate sodium (COLACE) 100 MG capsule Take 2 capsules (200 mg total) by mouth 2 (two) times daily. 02/07/16  Yes Leroy Sea, MD  feeding supplement, RESOURCE BREEZE, (RESOURCE BREEZE) LIQD Take 1 Container by mouth 3 (three) times daily between meals. 08/02/14  Yes Kathlen Mody, MD  folic acid (FOLVITE) 1 MG tablet Take 1 tablet (1 mg total) by mouth daily. 03/06/16  Yes Richarda Overlie, MD  gabapentin (NEURONTIN) 100 MG capsule Take 1 capsule (100 mg total) by mouth 2 (two) times daily. 03/06/16  Yes Richarda Overlie, MD  HYDROcodone-acetaminophen (NORCO/VICODIN) 5-325 MG tablet Take 1 tablet by mouth every 6 (six) hours as needed. Patient taking differently: Take 1 tablet by mouth every 6 (six) hours as needed for moderate pain.  01/09/16  Yes Derwood Kaplan, MD  HYDROmorphone (DILAUDID) 4 MG tablet Take 8 mg by mouth every 4 (four) hours as needed for severe pain.   Yes Historical Provider, MD  loperamide (IMODIUM) 2 MG capsule Take 1 capsule (2 mg total) by mouth 2 (two) times daily as needed for diarrhea or loose stools. 02/08/16  Yes Starleen Arms, MD  meclizine (ANTIVERT) 25 MG tablet Take 25 mg by mouth 3 (three) times daily as needed for dizziness.   Yes Historical Provider, MD  ondansetron (ZOFRAN ODT) 4 MG disintegrating tablet Take 1 tablet (4 mg total) by mouth every 8 (eight) hours  as needed for nausea or vomiting. 01/09/16  Yes Ankit Rhunette Croft, MD  pantoprazole (PROTONIX) 20 MG tablet Take 2 tablets (40 mg total) by mouth daily. 03/06/16  Yes Richarda Overlie, MD  polyethylene glycol (MIRALAX / GLYCOLAX) packet Take 17 g by mouth daily. 02/07/16  Yes Leroy Sea, MD  saccharomyces boulardii (FLORASTOR) 250 MG capsule Take 1 capsule (250 mg total) by mouth 2 (two) times daily. 04/05/15  Yes Vassie Loll, MD  thiamine (VITAMIN B-1) 100 MG tablet Take 1 tablet (100 mg total) by mouth daily. 03/06/16  Yes Richarda Overlie, MD  traZODone (DESYREL) 25 mg TABS tablet Take 0.5 tablets (25 mg total) by mouth at bedtime as needed for sleep. 08/02/14  Yes Kathlen Mody, MD  venlafaxine (EFFEXOR) 25 MG tablet Take 1 tablet (25 mg total) by mouth 2 (two) times daily. 03/04/15  Yes Alison Murray, MD  levothyroxine (SYNTHROID, LEVOTHROID) 25 MCG  tablet Take 1 tablet (25 mcg total) by mouth daily before breakfast. 03/06/16   Richarda Overlie, MD    Physical Exam: Vitals:   05/01/16 1604 05/01/16 1615 05/01/16 1630 05/01/16 1645  BP: 107/83 99/70 92/72  117/89  Pulse: 95 65 91   Resp: 18 13 20 16   Temp:      TempSrc:      SpO2: 100% 100% 96% 100%  Weight:      Height:         General:  Appears calm and comfortable, no acute distress Eyes:  PERRL, EOMI, normal lids, iris ENT:  grossly normal hearing, lips & tongue, Mucous membranes of her mouth are somewhat dry somewhat pale Neck:  no LAD, masses or thyromegaly Cardiovascular:  RRR, no m/r/g. No LE edema. Pedal pulses present and palpable Respiratory:  CTA bilaterally, no w/r/r. Normal respiratory effort. Abdomen:  soft, ntnd, positive bowel sounds throughout no guarding or rebounding Skin:  no rash or induration seen on limited exam Musculoskeletal:  grossly normal tone BUE/BLE, good ROM, no bony abnormality, joints without swelling/erythema Psychiatric:  grossly normal mood and affect, speech fluent and appropriate, AOx3 Neurologic:  CN 2-12  grossly intact, moves all extremities in coordinated fashion, sensation intact  Labs on Admission: I have personally reviewed following labs and imaging studies  CBC:  Recent Labs Lab 05/01/16 1330  WBC 9.2  HGB 12.5  HCT 39.1  MCV 97.8  PLT 55*   Basic Metabolic Panel:  Recent Labs Lab 05/01/16 1330  NA 139  K 3.5  CL 105  CO2 18*  GLUCOSE 142*  BUN <5*  CREATININE 1.12*  CALCIUM 7.8*   GFR: Estimated Creatinine Clearance: 36.5 mL/min (by C-G formula based on SCr of 1.12 mg/dL). Liver Function Tests:  Recent Labs Lab 05/01/16 1330  AST 77*  ALT 26  ALKPHOS 129*  BILITOT 1.1  PROT 5.1*  ALBUMIN 2.3*    Recent Labs Lab 05/01/16 1330  LIPASE 11   No results for input(s): AMMONIA in the last 168 hours. Coagulation Profile: No results for input(s): INR, PROTIME in the last 168 hours. Cardiac Enzymes: No results for input(s): CKTOTAL, CKMB, CKMBINDEX, TROPONINI in the last 168 hours. BNP (last 3 results) No results for input(s): PROBNP in the last 8760 hours. HbA1C: No results for input(s): HGBA1C in the last 72 hours. CBG: No results for input(s): GLUCAP in the last 168 hours. Lipid Profile: No results for input(s): CHOL, HDL, LDLCALC, TRIG, CHOLHDL, LDLDIRECT in the last 72 hours. Thyroid Function Tests: No results for input(s): TSH, T4TOTAL, FREET4, T3FREE, THYROIDAB in the last 72 hours. Anemia Panel: No results for input(s): VITAMINB12, FOLATE, FERRITIN, TIBC, IRON, RETICCTPCT in the last 72 hours. Urine analysis:    Component Value Date/Time   COLORURINE AMBER (A) 02/28/2016 1302   APPEARANCEUR TURBID (A) 02/28/2016 1302   LABSPEC 1.026 02/28/2016 1302   PHURINE 5.5 02/28/2016 1302   GLUCOSEU NEGATIVE 02/28/2016 1302   HGBUR MODERATE (A) 02/28/2016 1302   BILIRUBINUR SMALL (A) 02/28/2016 1302   KETONESUR 15 (A) 02/28/2016 1302   PROTEINUR 100 (A) 02/28/2016 1302   UROBILINOGEN 1.0 02/28/2015 1227   NITRITE POSITIVE (A) 02/28/2016 1302    LEUKOCYTESUR LARGE (A) 02/28/2016 1302    Creatinine Clearance: Estimated Creatinine Clearance: 36.5 mL/min (by C-G formula based on SCr of 1.12 mg/dL).  Sepsis Labs: @LABRCNTIP (procalcitonin:4,lacticidven:4) ) Recent Results (from the past 240 hour(s))  C difficile quick scan w PCR reflex     Status: None  Collection Time: 05/01/16  2:43 PM  Result Value Ref Range Status   C Diff antigen NEGATIVE NEGATIVE Final   C Diff toxin NEGATIVE NEGATIVE Final   C Diff interpretation No C. difficile detected.  Final     Radiological Exams on Admission: Dg Chest Port 1 View  Result Date: 05/01/2016 CLINICAL DATA:  Sepsis EXAM: PORTABLE CHEST 1 VIEW COMPARISON:  Feb 28, 2016 FINDINGS: There is no edema or consolidation. The heart size and pulmonary vascularity are normal. No adenopathy. No bone lesions. IMPRESSION: No edema or consolidation. Electronically Signed   By: Bretta Bang III M.D.   On: 05/01/2016 15:28    EKG: Independently reviewed.Sinus tachycardia with irregular rate Consider right atrial enlargement Anterior infarct, old Nonspecific T abnormalities, lateral leads T wave inversion RESOLVED SINCE PREVIOUS  Assessment/Plan Principal Problem:   Dehydration Active Problems:   DM2 (diabetes mellitus, type 2) (HCC)   Peripheral neuropathy (HCC)   HTN (hypertension)   Physical deconditioning   History of pulmonary embolus (PE)   Chronic pain   Thrombocytopenia (HCC)   Hypothyroidism   GERD (gastroesophageal reflux disease)   AKI (acute kidney injury) (HCC)   #1. Dehydration. Likely related to nausea vomiting decreased oral intake in the setting of Crohn's disease. Creatinine slightly elevated BUN less than 5. Reports nausea vomiting resolved requesting food at time of admission. Urinalysis pending. She received 1.5 L of normal saline in the emergency department -Admit to medical bed -Continue fairly vigorous IV fluids -Follow lactic acid -Diet as tolerated -Zofran for  home medications  #2. Acute kidney injury. Mild. Creatinine 1.12 on admission. Likely related to #1. Urinalysis pending -IV fluids -Hold nephrotoxins -Monitor urine output -Recheck in the morning  #3. Metabolic acidosis. Lactic acid 7.8 on presentation drifting down to 4.2 after fluid resuscitation. Patient presented with low-grade temperature of 99.8, heart rate greater than 90 respirations were normal mentation was normal WBC within the limits of normal, CO2 18, systolic blood pressure 87. Chest x-ray with no acute abnormalities. Resists all related to dehydration versus infectious process. Urinalysis pending blood cultures pending -Follow urinalysis -Follow blood cultures -Continue IV fluids -Obtain 1 more lactic acid this evening  #4. May Thurner syndrome. Chart review indicates recent thrombi lysis and stent placement in setting of acute LLE DVT. Medications include L requests -Continue Eliquis  #5. Diabetes type 2. Controlled. Serum glucose 142 on admission. Recent hemoglobin A1c 6.0.  -Follow CBGs -Sliding scale insulin for optimal control  #6. Hypotension. She was a history of hypertension taken off beta blocker 3 months ago. Home medications include no antihypertensives. Blood pressure improving after fluid resuscitation. -Monitor  #7. History of PE. -Continue L requests  #8. Thrombocytopenia. Platelets 55 on admission. Chart review indicates platelet count baseline low end of normal never quite this low. No signs symptoms of bleeding -Monitor  #9. Hypothyroidism. Home meds include Synthroid. -Continue Synthroid  #10. Chronic pain/neuropathy. Appears stable at baseline -Continue home meds    DVT prophylaxis: on eliquis  Code Status: full  Family Communication: none present  Disposition Plan: home  Consults called: none  Admission status: obs    Toya Smothers M MD [facility]  If 7PM-7AM, please contact night-coverage [email or web address] Password  [email or web address]  05/01/2016, 4:53 PM

## 2016-05-01 NOTE — ED Notes (Signed)
Pt has had two loose stools in her brief. Hx of c-diff, will have MD order c-diff sample.

## 2016-05-01 NOTE — Progress Notes (Signed)
Pharmacy Code Sepsis Protocol  Time of code sepsis page: 14: 15 []  Antibiotics delivered at n/a []  Antibiotics administered prior to code at n/a (if checked, omit next 2 questions)  Were antibiotics ordered at the time of the code sepsis page? No Was it required to contact the physician? []  Physician not contacted [x]  Physician contacted to order antibiotics for code sepsis []  Physician contacted to recommend changing antibiotics  Discussed twice with MD informing them of time frame for antibiotics - MD stated at 15:09 that they were cancelling Code Sepsis.   Pharmacy consulted for: none  Anti-infectives    None        Nurse education provided: [x]  Minutes left to administer antibiotics to achieve 1 hour goal [x]  Correct order of antibiotic administration [x]  Antibiotic Y-site compatibilities     Lonzo Cloud Gala Lewandowsky, PharmD 05/01/2016, 2:59 PM

## 2016-05-01 NOTE — Progress Notes (Signed)
Admission note:   Arrival Method: Via stretcher from ED. Mental Status: A&Ox4. Telemetry: Placed on box #13. Second verifier Carita Pian, Charity fundraiser.   Skin: Intact. Sacrum/buttocks reddened, but blanchable. Second verifier Carita Pian, Charity fundraiser.   Tubes: N/A. IV: LFA with NS@75  ml/hr infusing and RAC NSL. Pain: C/o chronic neck pain.   Family: No one at bedside. Living Situation: From home with son.  Safety Measures: Bed alarm on middle setting. Patient instructed to call for assistance before trying to get out of bed.  6E Orientation: Oriented to unit and surroundings. Call bell within reach.    Patient is incontinent and arrived to the unit wearing a disposable brief. This RN and second RN informed patient we do not use disposable briefs on this unit. She stated understanding, but also stated she does not feel comfortable not having a brief on. Informed her of the risks of skin breakdown and patient stated understanding.  Leanna Battles, RN.

## 2016-05-01 NOTE — ED Triage Notes (Signed)
Pt here via [facility] from physical therapy when PT could not find a blood pressure on her. Pt reports n/v last night, which is not unusual for her. She is a&o X4. No meds given PTA.

## 2016-05-02 DIAGNOSIS — K210 Gastro-esophageal reflux disease with esophagitis: Secondary | ICD-10-CM

## 2016-05-02 DIAGNOSIS — G894 Chronic pain syndrome: Secondary | ICD-10-CM | POA: Diagnosis not present

## 2016-05-02 DIAGNOSIS — E118 Type 2 diabetes mellitus with unspecified complications: Secondary | ICD-10-CM | POA: Diagnosis not present

## 2016-05-02 DIAGNOSIS — I10 Essential (primary) hypertension: Secondary | ICD-10-CM

## 2016-05-02 DIAGNOSIS — E860 Dehydration: Secondary | ICD-10-CM | POA: Diagnosis not present

## 2016-05-02 LAB — BASIC METABOLIC PANEL
Anion gap: 13 (ref 5–15)
BUN: <5 mg/dL — ABNORMAL LOW (ref 6–20)
CHLORIDE: 111 mmol/L (ref 101–111)
CO2: 16 mmol/L — ABNORMAL LOW (ref 22–32)
CREATININE: 0.85 mg/dL (ref 0.44–1.00)
Calcium: 6.6 mg/dL — ABNORMAL LOW (ref 8.9–10.3)
GFR calc non Af Amer: >60 mL/min (ref >60)
Glucose, Bld: 107 mg/dL — ABNORMAL HIGH (ref 65–99)
POTASSIUM: 3 mmol/L — AB (ref 3.5–5.1)
SODIUM: 140 mmol/L (ref 135–145)

## 2016-05-02 LAB — CBC
HEMATOCRIT: 32 % — AB (ref 36.0–46.0)
Hemoglobin: 10.4 g/dL — ABNORMAL LOW (ref 12.0–15.0)
MCH: 31.3 pg (ref 26.0–34.0)
MCHC: 32.5 g/dL (ref 30.0–36.0)
MCV: 96.4 fL (ref 78.0–100.0)
PLATELETS: 91 10*3/uL — AB (ref 150–400)
RBC: 3.32 MIL/uL — AB (ref 3.87–5.11)
RDW: 18.6 % — ABNORMAL HIGH (ref 11.5–15.5)
WBC: 7.6 10*3/uL (ref 4.0–10.5)

## 2016-05-02 LAB — GLUCOSE, CAPILLARY
GLUCOSE-CAPILLARY: 142 mg/dL — AB (ref 65–99)
Glucose-Capillary: 103 mg/dL — ABNORMAL HIGH (ref 65–99)
Glucose-Capillary: 117 mg/dL — ABNORMAL HIGH (ref 65–99)

## 2016-05-02 LAB — MAGNESIUM: MAGNESIUM: 1.3 mg/dL — AB (ref 1.7–2.4)

## 2016-05-02 MED ORDER — MAGNESIUM SULFATE 50 % IJ SOLN
3.0000 g | Freq: Once | INTRAMUSCULAR | Status: AC
  Administered 2016-05-02: 3 g via INTRAVENOUS
  Filled 2016-05-02: qty 6

## 2016-05-02 MED ORDER — SODIUM CHLORIDE 0.9 % IV BOLUS (SEPSIS)
250.0000 mL | Freq: Once | INTRAVENOUS | Status: AC
  Administered 2016-05-02: 250 mL via INTRAVENOUS

## 2016-05-02 MED ORDER — SODIUM CHLORIDE 0.9 % IV SOLN
INTRAVENOUS | Status: DC
  Administered 2016-05-02: 11:00:00 via INTRAVENOUS
  Filled 2016-05-02 (×3): qty 1000

## 2016-05-02 MED ORDER — POTASSIUM CHLORIDE CRYS ER 20 MEQ PO TBCR: 40.0000 meq | EXTENDED_RELEASE_TABLET | Freq: Every day | ORAL | 0 refills | Status: DC

## 2016-05-02 MED ORDER — POTASSIUM CHLORIDE CRYS ER 20 MEQ PO TBCR
60.0000 meq | EXTENDED_RELEASE_TABLET | Freq: Once | ORAL | Status: DC
  Filled 2016-05-02: qty 3

## 2016-05-02 MED ORDER — APIXABAN 5 MG PO TABS: 5.0000 mg | ORAL_TABLET | Freq: Two times a day (BID) | ORAL | 0 refills | Status: DC

## 2016-05-02 NOTE — ED Notes (Signed)
Pt resting and blood pressure reading 90 systolic, consulted with admitting team and advised to feed the patient and administer another 250cc bolus of saline.

## 2016-05-02 NOTE — Progress Notes (Signed)
Initial Nutrition Assessment  DOCUMENTATION CODES:   Severe malnutrition in context of chronic illness, Underweight  INTERVENTION:  Continue nutritional supplementation at home post discharge.   NUTRITION DIAGNOSIS:   Malnutrition related to chronic illness as evidenced by percent weight loss, severe depletion of muscle mass.  GOAL:   Patient will meet greater than or equal to 90% of their needs  MONITOR:   PO intake, Supplement acceptance, Labs, Weight trends, Skin, I & O's  REASON FOR ASSESSMENT:    (Low BMI)    ASSESSMENT:   55 y.o. female with a Past Medical History of asthma, Crohn's disease, DM, GETRD, Glaucoma, HLD, HTN, Hypothyroidism, Neuropathy, Pancreatitis, PE, thrombocytopenia, tobacco use who presents with severe dehydration and lactic acidemia resulting in AKI.   Meal completion has been 75-85%. Pt reports appetite is fine currently and reported hunger during time of visit. Pt does reports having a more varied appetite PTA. She reports usually consuming 3 meals a day (meals consist a protein, starch, and vegetable) however on some days, she may have abdominal pains and thus not eat and only consume lemon or orange wedges. Usual body weight reported to be ~125 lbs. Per Epic weight records, pt with a 13% weight loss in 2 months. Pt currently has Boost Breeze ordered and has been consuming them. Pt encouraged to continue with nutritional supplementation at home post discharge to prevent further weight loss and to aid in adequate nutrition. Plans for discharge today.   Nutrition-Focused physical exam completed. Findings are moderate fat depletion, moderate to severe muscle depletion, and no edema.   Labs and medications reviewed.   Diet Order:  Diet regular Room service appropriate? Yes; Fluid consistency: Thin Diet - low sodium heart healthy  Skin:  Reviewed, no issues  Last BM:  8/1  Height:   Ht Readings from Last 1 Encounters:  05/01/16  (1.651 m)     Weight:   Wt Readings from Last 1 Encounters:  05/01/16 94 lb 9.2 oz (42.9 kg)    Ideal Body Weight:  56.8 kg  BMI:  Body mass index is 15.74 kg/m.  Estimated Nutritional Needs:   Kcal:  1500-1800  Protein:  65-75 grams  Fluid:  >/= 1.5 L/day  EDUCATION NEEDS:   Education needs addressed  Roslyn Smiling, MS, RD, LDN Pager # [phone] After hours/ weekend pager # [phone]

## 2016-05-02 NOTE — Progress Notes (Signed)
[name] to be D/C'd Home per MD order.  Discussed prescriptions and follow up appointments with the patient. Prescriptions given to patient, medication list explained in detail. Pt verbalized understanding.    Medication List    STOP taking these medications   loperamide 2 MG capsule Commonly known as:  IMODIUM     TAKE these medications   apixaban 5 MG Tabs tablet Commonly known as:  ELIQUIS Take 1 tablet (5 mg total) by mouth 2 (two) times daily. What changed:  how much to take  Another medication with the same name was removed. Continue taking this medication, and follow the directions you see here.   calcium carbonate 1250 (500 Ca) MG tablet Commonly known as:  OS-CAL - dosed in mg of elemental calcium Take 1 tablet (500 mg of elemental calcium total) by mouth daily with breakfast.   docusate sodium 100 MG capsule Commonly known as:  COLACE Take 2 capsules (200 mg total) by mouth 2 (two) times daily.   feeding supplement Liqd Take 1 Container by mouth 3 (three) times daily between meals.   folic acid 1 MG tablet Commonly known as:  FOLVITE Take 1 tablet (1 mg total) by mouth daily.   gabapentin 100 MG capsule Commonly known as:  NEURONTIN Take 1 capsule (100 mg total) by mouth 2 (two) times daily.   HYDROcodone-acetaminophen 5-325 MG tablet Commonly known as:  NORCO/VICODIN Take 1 tablet by mouth every 6 (six) hours as needed. What changed:  reasons to take this   HYDROmorphone 4 MG tablet Commonly known as:  DILAUDID Take 8 mg by mouth every 4 (four) hours as needed for severe pain.   levothyroxine 25 MCG tablet Commonly known as:  SYNTHROID, LEVOTHROID Take 1 tablet (25 mcg total) by mouth daily before breakfast.   meclizine 25 MG tablet Commonly known as:  ANTIVERT Take 25 mg by mouth 3 (three) times daily as needed for dizziness.   ondansetron 4 MG disintegrating tablet Commonly known as:  ZOFRAN ODT Take 1 tablet (4 mg total) by mouth every 8  (eight) hours as needed for nausea or vomiting.   pantoprazole 20 MG tablet Commonly known as:  PROTONIX Take 2 tablets (40 mg total) by mouth daily.   polyethylene glycol packet Commonly known as:  MIRALAX / GLYCOLAX Take 17 g by mouth daily.   potassium chloride SA 20 MEQ tablet Commonly known as:  K-DUR,KLOR-CON Take 2 tablets (40 mEq total) by mouth daily.   saccharomyces boulardii 250 MG capsule Commonly known as:  FLORASTOR Take 1 capsule (250 mg total) by mouth 2 (two) times daily.   thiamine 100 MG tablet Commonly known as:  VITAMIN B-1 Take 1 tablet (100 mg total) by mouth daily.   traZODone 25 mg Tabs tablet Commonly known as:  DESYREL Take 0.5 tablets (25 mg total) by mouth at bedtime as needed for sleep.   venlafaxine 25 MG tablet Commonly known as:  EFFEXOR Take 1 tablet (25 mg total) by mouth 2 (two) times daily.       Vitals:   05/02/16 0518 05/02/16 0924  BP: 110/85 (!) 94/58  Pulse: 88 96  Resp: 18 18  Temp: 98.5 F (36.9 C) 98.2 F (36.8 C)    Skin clean, dry and intact without evidence of skin break down, no evidence of skin tears noted. IV catheter discontinued intact. Site without signs and symptoms of complications. Dressing and pressure applied. Pt denies pain at this time. No complaints noted.  An After Visit Summary was printed and given to the patient. Patient to leave unit via stretcher by ambulance service.  Trish Fountain 05/02/2016 5:36 PM

## 2016-05-02 NOTE — Discharge Summary (Addendum)
Physician Discharge Summary  KAYZLEE WIRTANEN MRN: [id number] DOB/AGE: Aug 14, 1961 55 y.o.  PCP: Wenda Low, MD   Admit date: 05/01/2016 Discharge date: 05/02/2016  Discharge Diagnoses:    Principal Problem:   Dehydration Active Problems:   Diabetes mellitus with complication (HCC)   Peripheral neuropathy (HCC)   HTN (hypertension)   Physical deconditioning   History of pulmonary embolus (PE)   Chronic pain   Thrombocytopenia (HCC)   Hypothyroidism   GERD (gastroesophageal reflux disease)   AKI (acute kidney injury) ([place])    Follow-up recommendations Follow-up with PCP in 3-5 days , including all  additional recommended appointments as below Follow-up CBC, CMP in 3-5 days       Current Discharge Medication List    START taking these medications   Details  potassium chloride SA (K-DUR,KLOR-CON) 20 MEQ tablet Take 2 tablets (40 mEq total) by mouth daily. Qty: 20 tablet, Refills: 0      CONTINUE these medications which have NOT CHANGED   Details  apixaban (ELIQUIS) 5 MG TABS tablet Take 2 tablets (10 mg total) by mouth 2 (two) times daily. Qty: 60 tablet, Refills: 0    calcium carbonate (OS-CAL - DOSED IN MG OF ELEMENTAL CALCIUM) 1250 (500 CA) MG tablet Take 1 tablet (500 mg of elemental calcium total) by mouth daily with breakfast. Qty: 30 tablet, Refills: 0    docusate sodium (COLACE) 100 MG capsule Take 2 capsules (200 mg total) by mouth 2 (two) times daily. Qty: 20 capsule, Refills: 0    feeding supplement, RESOURCE BREEZE, (RESOURCE BREEZE) LIQD Take 1 Container by mouth 3 (three) times daily between meals. Refills: 0    folic acid (FOLVITE) 1 MG tablet Take 1 tablet (1 mg total) by mouth daily. Qty: 30 tablet, Refills: 0    gabapentin (NEURONTIN) 100 MG capsule Take 1 capsule (100 mg total) by mouth 2 (two) times daily.    HYDROcodone-acetaminophen (NORCO/VICODIN) 5-325 MG tablet Take 1 tablet by mouth every 6 (six) hours as needed. Qty: 4 tablet,  Refills: 0    HYDROmorphone (DILAUDID) 4 MG tablet Take 8 mg by mouth every 4 (four) hours as needed for severe pain.    meclizine (ANTIVERT) 25 MG tablet Take 25 mg by mouth 3 (three) times daily as needed for dizziness.    ondansetron (ZOFRAN ODT) 4 MG disintegrating tablet Take 1 tablet (4 mg total) by mouth every 8 (eight) hours as needed for nausea or vomiting. Qty: 20 tablet, Refills: 0    pantoprazole (PROTONIX) 20 MG tablet Take 2 tablets (40 mg total) by mouth daily. Qty: 30 tablet, Refills: 1    polyethylene glycol (MIRALAX / GLYCOLAX) packet Take 17 g by mouth daily. Qty: 30 each, Refills: 0    saccharomyces boulardii (FLORASTOR) 250 MG capsule Take 1 capsule (250 mg total) by mouth 2 (two) times daily.    thiamine (VITAMIN B-1) 100 MG tablet Take 1 tablet (100 mg total) by mouth daily. Qty: 30 tablet, Refills: 0    traZODone (DESYREL) 25 mg TABS tablet Take 0.5 tablets (25 mg total) by mouth at bedtime as needed for sleep. Qty: 30 tablet, Refills: 0    venlafaxine (EFFEXOR) 25 MG tablet Take 1 tablet (25 mg total) by mouth 2 (two) times daily. Qty: 30 tablet, Refills: 0    levothyroxine (SYNTHROID, LEVOTHROID) 25 MCG tablet Take 1 tablet (25 mcg total) by mouth daily before breakfast. Qty: 30 tablet, Refills: 0      STOP taking these medications  loperamide (IMODIUM) 2 MG capsule          Discharge Condition: stable  Discharge Instructions Get Medicines reviewed and adjusted: Please take all your medications with you for your next visit with your Primary MD  Please request your Primary MD to go over all hospital tests and procedure/radiological results at the follow up, please ask your Primary MD to get all Hospital records sent to his/her office.  If you experience worsening of your admission symptoms, develop shortness of breath, life threatening emergency, suicidal or homicidal thoughts you must seek medical attention immediately by calling 911 or  calling your MD immediately if symptoms less severe.  You must read complete instructions/literature along with all the possible adverse reactions/side effects for all the Medicines you take and that have been prescribed to you. Take any new Medicines after you have completely understood and accpet all the possible adverse reactions/side effects.   Do not drive when taking Pain medications.   Do not take more than prescribed Pain, Sleep and Anxiety Medications  Special Instructions: If you have smoked or chewed Tobacco in the last 2 yrs please stop smoking, stop any regular Alcohol and or any Recreational drug use.  Wear Seat belts while driving.  Please note  You were cared for by a hospitalist during your hospital stay. Once you are discharged, your primary care physician will handle any further medical issues. Please note that NO REFILLS for any discharge medications will be authorized once you are discharged, as it is imperative that you return to your primary care physician (or establish a relationship with a primary care physician if you do not have one) for your aftercare needs so that they can reassess your need for medications and monitor your lab values.  Discharge Instructions    Diet - low sodium heart healthy    Complete by:  As directed   Increase activity slowly    Complete by:  As directed       Allergies  Allergen Reactions  . Iohexol Other (See Comments)    "severe burning" Patient has received Contrast in 2005 with 13 hour pre-medication, and had no reaction at that time  . Spiriva Handihaler [Tiotropium Bromide Monohydrate] Nausea And Vomiting  . Iodinated Diagnostic Agents Other (See Comments)  . Ativan [Lorazepam] Other (See Comments)    hallucinations  . Penicillins Hives    Has had cephalosporins      Disposition: 06-Home-Health Care Svc   Consults: none        Dg Chest Port 1 View  Result Date: 05/01/2016 CLINICAL DATA:  Sepsis EXAM: PORTABLE  CHEST 1 VIEW COMPARISON:  Feb 28, 2016 FINDINGS: There is no edema or consolidation. The heart size and pulmonary vascularity are normal. No adenopathy. No bone lesions. IMPRESSION: No edema or consolidation. Electronically Signed   By: Lowella Grip III M.D.   On: 05/01/2016 15:28        Filed Weights   05/01/16 1430 05/01/16 2044  Weight: 41.7 kg (92 lb) 42.9 kg (94 lb 9.2 oz)     Microbiology: Recent Results (from the past 240 hour(s))  C difficile quick scan w PCR reflex     Status: None   Collection Time: 05/01/16  2:43 PM  Result Value Ref Range Status   C Diff antigen NEGATIVE NEGATIVE Final   C Diff toxin NEGATIVE NEGATIVE Final   C Diff interpretation No C. difficile detected.  Final       Blood Culture  Component Value Date/Time   SDES BLOOD RIGHT FOREARM 02/28/2016 1643   SPECREQUEST IN PEDIATRIC BOTTLE 3CC 02/28/2016 1643   CULT NO GROWTH 5 DAYS 02/28/2016 1643   REPTSTATUS 03/04/2016 FINAL 02/28/2016 1643      Labs: Results for orders placed or performed during the hospital encounter of 05/01/16 (from the past 48 hour(s))  CBC     Status: Abnormal   Collection Time: 05/01/16  1:30 PM  Result Value Ref Range   WBC 9.2 4.0 - 10.5 K/uL   RBC 4.00 3.87 - 5.11 MIL/uL   Hemoglobin 12.5 12.0 - 15.0 g/dL   HCT 39.1 36.0 - 46.0 %   MCV 97.8 78.0 - 100.0 fL   MCH 31.3 26.0 - 34.0 pg   MCHC 32.0 30.0 - 36.0 g/dL   RDW 18.1 (H) 11.5 - 15.5 %   Platelets 55 (L) 150 - 400 K/uL    Comment: REPEATED TO VERIFY PLATELET COUNT CONFIRMED BY SMEAR SPECIMEN CHECKED FOR CLOTS   Comprehensive metabolic panel     Status: Abnormal   Collection Time: 05/01/16  1:30 PM  Result Value Ref Range   Sodium 139 135 - 145 mmol/L   Potassium 3.5 3.5 - 5.1 mmol/L   Chloride 105 101 - 111 mmol/L   CO2 18 (L) 22 - 32 mmol/L   Glucose, Bld 142 (H) 65 - 99 mg/dL   BUN <5 (L) 6 - 20 mg/dL   Creatinine, Ser 1.12 (H) 0.44 - 1.00 mg/dL   Calcium 7.8 (L) 8.9 - 10.3 mg/dL   Total  Protein 5.1 (L) 6.5 - 8.1 g/dL   Albumin 2.3 (L) 3.5 - 5.0 g/dL   AST 77 (H) 15 - 41 U/L   ALT 26 14 - 54 U/L   Alkaline Phosphatase 129 (H) 38 - 126 U/L   Total Bilirubin 1.1 0.3 - 1.2 mg/dL   GFR calc non Af Amer 53 (L) >60 mL/min   GFR calc Af Amer >60 >60 mL/min    Comment: (NOTE) The eGFR has been calculated using the CKD EPI equation. This calculation has not been validated in all clinical situations. eGFR's persistently <60 mL/min signify possible Chronic Kidney Disease.    Anion gap 16 (H) 5 - 15  Lipase, blood     Status: None   Collection Time: 05/01/16  1:30 PM  Result Value Ref Range   Lipase 11 11 - 51 U/L  I-Stat CG4 Lactic Acid, ED     Status: Abnormal   Collection Time: 05/01/16  1:41 PM  Result Value Ref Range   Lactic Acid, Venous 7.88 (HH) 0.5 - 1.9 mmol/L   Comment NOTIFIED PHYSICIAN   C difficile quick scan w PCR reflex     Status: None   Collection Time: 05/01/16  2:43 PM  Result Value Ref Range   C Diff antigen NEGATIVE NEGATIVE   C Diff toxin NEGATIVE NEGATIVE   C Diff interpretation No C. difficile detected.   I-Stat CG4 Lactic Acid, ED  (not at  [facility])     Status: Abnormal   Collection Time: 05/01/16  4:52 PM  Result Value Ref Range   Lactic Acid, Venous 4.28 (HH) 0.5 - 1.9 mmol/L   Comment NOTIFIED PHYSICIAN   TSH     Status: None   Collection Time: 05/01/16  8:22 PM  Result Value Ref Range   TSH 1.021 0.350 - 4.500 uIU/mL  Lactic acid, plasma     Status: Abnormal   Collection Time: 05/01/16  8:22 PM  Result Value Ref Range   Lactic Acid, Venous 4.1 (HH) 0.5 - 1.9 mmol/L    Comment: CRITICAL RESULT CALLED TO, READ BACK BY AND VERIFIED WITH: MAHMOUD Z,RN 05/01/16 2100 WAYK   Glucose, capillary     Status: Abnormal   Collection Time: 05/01/16  8:44 PM  Result Value Ref Range   Glucose-Capillary 132 (H) 65 - 99 mg/dL  Basic metabolic panel     Status: Abnormal   Collection Time: 05/02/16  6:34 AM  Result Value Ref Range   Sodium 140 135 -  145 mmol/L   Potassium 3.0 (L) 3.5 - 5.1 mmol/L   Chloride 111 101 - 111 mmol/L   CO2 16 (L) 22 - 32 mmol/L   Glucose, Bld 107 (H) 65 - 99 mg/dL   BUN <5 (L) 6 - 20 mg/dL   Creatinine, Ser 0.85 0.44 - 1.00 mg/dL   Calcium 6.6 (L) 8.9 - 10.3 mg/dL   GFR calc non Af Amer >60 >60 mL/min   GFR calc Af Amer >60 >60 mL/min    Comment: (NOTE) The eGFR has been calculated using the CKD EPI equation. This calculation has not been validated in all clinical situations. eGFR's persistently <60 mL/min signify possible Chronic Kidney Disease.    Anion gap 13 5 - 15  Magnesium     Status: Abnormal   Collection Time: 05/02/16  6:34 AM  Result Value Ref Range   Magnesium 1.3 (L) 1.7 - 2.4 mg/dL  Glucose, capillary     Status: Abnormal   Collection Time: 05/02/16  7:27 AM  Result Value Ref Range   Glucose-Capillary 142 (H) 65 - 99 mg/dL  CBC     Status: Abnormal   Collection Time: 05/02/16  7:42 AM  Result Value Ref Range   WBC 7.6 4.0 - 10.5 K/uL   RBC 3.32 (L) 3.87 - 5.11 MIL/uL   Hemoglobin 10.4 (L) 12.0 - 15.0 g/dL   HCT 32.0 (L) 36.0 - 46.0 %   MCV 96.4 78.0 - 100.0 fL   MCH 31.3 26.0 - 34.0 pg   MCHC 32.5 30.0 - 36.0 g/dL   RDW 18.6 (H) 11.5 - 15.5 %   Platelets 91 (L) 150 - 400 K/uL    Comment: CONSISTENT WITH PREVIOUS RESULT     Lipid Panel     Component Value Date/Time   CHOL 80 03/29/2015 1036   TRIG 133 03/29/2015 1036   HDL 16 (L) 03/29/2015 1036   CHOLHDL 5.0 03/29/2015 1036   VLDL 27 03/29/2015 1036   LDLCALC 37 03/29/2015 1036     Lab Results  Component Value Date   HGBA1C 4.9 02/28/2016   HGBA1C 6.0 (H) 04/01/2015   HGBA1C 4.9 05/08/2014        HPI :   55 y.o. 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 Initial evaluation reveals metabolic acidosis with an elevated lactic acid and hypotension with tachycardia.  Presented with  2 episodes of nausea with vomiting, headache generalized weakness.  Admitted for dehydration, nausea vomiting likely secondary to chronic constipation secondary to narcotics versus gastroenteritis  HOSPITAL COURSE:    #1. Dehydration/History of present illness. Likely related to nausea vomiting decreased oral intake in the setting of chronic narcotic use.   Admitted for dehydration,  nausea vomiting likely secondary to chronic constipation secondary to narcotics versus gastroenteritis Resuscitated with IV fluids Found to have low magnesium, low potassium Felt better after she had a bowel movement. Patient states that she had not had a BM in 6 days prior to admission Patient denies any hematochezia so doubt Crohn's exacerbation  #2. Acute kidney injury. Mild. Creatinine 1.12 on admission. Likely related to #1. Resolved creatinine 0.85 prior to discharge    #3. Metabolic acidosis. Lactic acid 7.8 on presentation drifting down to 4.2 after fluid resuscitation. Patient presented with low-grade temperature of 99.8, heart rate greater than 90 respirations were normal mentation was normal WBC within the limits of normal, CO2 18, systolic blood pressure 87. Chest x-ray with no acute abnormalities. -Follow urinalysis Blood cultures no growth so far  #4. May Thurner syndrome. Chart review indicates recent thrombi lysis and stent placement in setting of acute LLE DVT. Continue   Eliquis  #5. Diabetes type 2. Controlled. Serum glucose 142 on admission. Recent hemoglobin A1c 6.0.  -Follow CBGs, noncompliant with dietary recommendations during this hospitalization -Sliding scale insulin for optimal control  #6. Hypotension. She was a history of hypertension taken off beta blocker 3 months ago. Home medications include no antihypertensives. Blood pressure  improving after fluid resuscitation.    #7. History of PE. -Continue L requests  #8. Thrombocytopenia. Platelets 55 on admission. Now 91. Chart review indicates platelet count baseline low end of normal never quite this low. No signs symptoms of bleeding    #9. Hypothyroidism. Home meds include Synthroid. -Continue Synthroid  #10. Chronic pain/neuropathy. Appears stable at baseline -Continue home meds   #11-hypokalemia and hypomagnesemia-repleted prior to discharge and the patient was provided with potassium supplementation   Discharge Exam:  Blood pressure (!) 94/58, pulse 96, temperature 98.2 F (36.8 C), temperature source Oral, resp. rate 18, height 5' 5"  (1.651 m), weight 42.9 kg (94 lb 9.2 oz), last menstrual period 10/01/2010, SpO2 98 %.   General:  Appears calm and comfortable, no acute distress  Eyes:  PERRL, EOMI, normal lids, iris  ENT:  grossly normal hearing, lips & tongue, Mucous membranes of her mouth are somewhat dry somewhat pale  Neck:  no LAD, masses or thyromegaly  Cardiovascular:  RRR, no m/r/g. No LE edema. Pedal pulses present and palpable  Respiratory:  CTA bilaterally, no w/r/r. Normal respiratory effort.  Abdomen:  soft, ntnd, positive bowel sounds throughout no guarding or rebounding  Skin:  no rash or induration seen on limited exam  Musculoskeletal:  grossly normal tone BUE/BLE, good ROM, no bony abnormality, joints without swelling/erythema  Psychiatric:  grossly normal mood and affect, speech fluent and appropriate, AOx3  Neurologic:  CN 2-12 grossly intact, moves all extremities in coordinated fashion, sensation intact    Follow-up Information    HUSAIN,KARRAR, MD. Schedule an appointment as soon as possible for a visit in 2 day(s).   Specialty:  Internal Medicine Why:  Call for appointment for hospital follow-up Contact information: [address] [place] [address] [phone]            Signed: Reyne Dumas 05/02/2016, 11:07 AM        Time spent >45 mins

## 2016-05-02 NOTE — Progress Notes (Signed)
Pt refused to allow staff to check her O2 stat during ambulation. Unable to complete order for; checking pulse oximetry while ambulating. Will make Dr. Susie Cassette aware.

## 2016-05-02 NOTE — Progress Notes (Signed)
PT Cancellation Note  Patient Details Name: MARCUS SANTIN MRN: [id number] DOB: July 21, 1961   Cancelled Treatment:    Reason Eval/Treat Not Completed: Other (comment) (Pt declined, does not want to disrupt IV.)  Pt plans on D/C'ing home today; her sister will be picking her up; she has no stairs to enter residence, as she moved to a first floor apartment. Pt expresses no concern transferring from car to wheelchair.  Pt O2 sats were stable at 99 while lying in bed on room air.   Nathanial Rancher, SPT Acute Rehabilitation Services  Nathanial Rancher 05/02/2016, 2:56 PM

## 2016-05-02 NOTE — Discharge Instructions (Signed)

## 2016-05-02 NOTE — Clinical Social Work Note (Signed)
Patient medically stable for discharge home today and ambulance transport requested. Ambulance form completed and transport called.  Genelle Bal, MSW, LCSW Licensed Clinical Social Worker Clinical Social Work Department [employer] [phone]

## 2016-05-06 LAB — CULTURE, BLOOD (ROUTINE X 2)
Culture: NO GROWTH
Culture: NO GROWTH

## 2016-05-08 DIAGNOSIS — I82442 Acute embolism and thrombosis of left tibial vein: Secondary | ICD-10-CM | POA: Diagnosis not present

## 2016-05-08 DIAGNOSIS — I82492 Acute embolism and thrombosis of other specified deep vein of left lower extremity: Secondary | ICD-10-CM | POA: Diagnosis not present

## 2016-05-08 DIAGNOSIS — I82412 Acute embolism and thrombosis of left femoral vein: Secondary | ICD-10-CM | POA: Diagnosis not present

## 2016-05-08 DIAGNOSIS — I82432 Acute embolism and thrombosis of left popliteal vein: Secondary | ICD-10-CM | POA: Diagnosis not present

## 2016-05-08 DIAGNOSIS — N390 Urinary tract infection, site not specified: Secondary | ICD-10-CM | POA: Diagnosis not present

## 2016-05-08 DIAGNOSIS — E1140 Type 2 diabetes mellitus with diabetic neuropathy, unspecified: Secondary | ICD-10-CM | POA: Diagnosis not present

## 2016-05-10 DIAGNOSIS — I82492 Acute embolism and thrombosis of other specified deep vein of left lower extremity: Secondary | ICD-10-CM | POA: Diagnosis not present

## 2016-05-10 DIAGNOSIS — I82442 Acute embolism and thrombosis of left tibial vein: Secondary | ICD-10-CM | POA: Diagnosis not present

## 2016-05-10 DIAGNOSIS — E1140 Type 2 diabetes mellitus with diabetic neuropathy, unspecified: Secondary | ICD-10-CM | POA: Diagnosis not present

## 2016-05-10 DIAGNOSIS — I82432 Acute embolism and thrombosis of left popliteal vein: Secondary | ICD-10-CM | POA: Diagnosis not present

## 2016-05-10 DIAGNOSIS — I82412 Acute embolism and thrombosis of left femoral vein: Secondary | ICD-10-CM | POA: Diagnosis not present

## 2016-05-10 DIAGNOSIS — N390 Urinary tract infection, site not specified: Secondary | ICD-10-CM | POA: Diagnosis not present

## 2016-05-15 ENCOUNTER — Encounter (HOSPITAL_COMMUNITY): Payer: Self-pay | Admitting: Emergency Medicine

## 2016-05-15 ENCOUNTER — Emergency Department (HOSPITAL_COMMUNITY): Payer: Medicare Other

## 2016-05-15 ENCOUNTER — Emergency Department (HOSPITAL_COMMUNITY)
Admission: EM | Admit: 2016-05-15 | Discharge: 2016-05-15 | Disposition: A | Discharge status: Home / Self Care | Payer: Medicare Other | Attending: Emergency Medicine | Admitting: Emergency Medicine

## 2016-05-15 DIAGNOSIS — Z7901 Long term (current) use of anticoagulants: Secondary | ICD-10-CM | POA: Insufficient documentation

## 2016-05-15 DIAGNOSIS — F17210 Nicotine dependence, cigarettes, uncomplicated: Secondary | ICD-10-CM | POA: Diagnosis not present

## 2016-05-15 DIAGNOSIS — E872 Acidosis, unspecified: Secondary | ICD-10-CM

## 2016-05-15 DIAGNOSIS — E1140 Type 2 diabetes mellitus with diabetic neuropathy, unspecified: Secondary | ICD-10-CM | POA: Diagnosis not present

## 2016-05-15 DIAGNOSIS — R911 Solitary pulmonary nodule: Secondary | ICD-10-CM | POA: Diagnosis not present

## 2016-05-15 DIAGNOSIS — R112 Nausea with vomiting, unspecified: Secondary | ICD-10-CM | POA: Diagnosis present

## 2016-05-15 DIAGNOSIS — J45909 Unspecified asthma, uncomplicated: Secondary | ICD-10-CM | POA: Insufficient documentation

## 2016-05-15 DIAGNOSIS — R404 Transient alteration of awareness: Secondary | ICD-10-CM | POA: Diagnosis not present

## 2016-05-15 DIAGNOSIS — R109 Unspecified abdominal pain: Secondary | ICD-10-CM | POA: Diagnosis not present

## 2016-05-15 DIAGNOSIS — Z79899 Other long term (current) drug therapy: Secondary | ICD-10-CM | POA: Insufficient documentation

## 2016-05-15 DIAGNOSIS — R531 Weakness: Secondary | ICD-10-CM | POA: Insufficient documentation

## 2016-05-15 DIAGNOSIS — I10 Essential (primary) hypertension: Secondary | ICD-10-CM | POA: Insufficient documentation

## 2016-05-15 DIAGNOSIS — R4182 Altered mental status, unspecified: Secondary | ICD-10-CM | POA: Diagnosis not present

## 2016-05-15 DIAGNOSIS — E876 Hypokalemia: Secondary | ICD-10-CM | POA: Insufficient documentation

## 2016-05-15 DIAGNOSIS — E039 Hypothyroidism, unspecified: Secondary | ICD-10-CM | POA: Diagnosis not present

## 2016-05-15 DIAGNOSIS — R110 Nausea: Secondary | ICD-10-CM

## 2016-05-15 DIAGNOSIS — N200 Calculus of kidney: Secondary | ICD-10-CM | POA: Diagnosis not present

## 2016-05-15 DIAGNOSIS — Z7409 Other reduced mobility: Secondary | ICD-10-CM | POA: Diagnosis not present

## 2016-05-15 LAB — CBC
HCT: 32.7 % — ABNORMAL LOW (ref 36.0–46.0)
Hemoglobin: 10.5 g/dL — ABNORMAL LOW (ref 12.0–15.0)
MCH: 31.2 pg (ref 26.0–34.0)
MCHC: 32.1 g/dL (ref 30.0–36.0)
MCV: 97 fL (ref 78.0–100.0)
PLATELETS: 244 10*3/uL (ref 150–400)
RBC: 3.37 MIL/uL — AB (ref 3.87–5.11)
RDW: 19.4 % — ABNORMAL HIGH (ref 11.5–15.5)
WBC: 8 10*3/uL (ref 4.0–10.5)

## 2016-05-15 LAB — URINALYSIS, ROUTINE W REFLEX MICROSCOPIC
GLUCOSE, UA: NEGATIVE mg/dL
Hgb urine dipstick: NEGATIVE
Ketones, ur: 15 mg/dL — AB
Nitrite: NEGATIVE
Protein, ur: 30 mg/dL — AB
Specific Gravity, Urine: 1.022 (ref 1.005–1.030)
pH: 6 (ref 5.0–8.0)

## 2016-05-15 LAB — BASIC METABOLIC PANEL
Anion gap: 16 — ABNORMAL HIGH (ref 5–15)
BUN: <5 mg/dL — ABNORMAL LOW (ref 6–20)
CALCIUM: 7.6 mg/dL — AB (ref 8.9–10.3)
CO2: 15 mmol/L — ABNORMAL LOW (ref 22–32)
CREATININE: 0.93 mg/dL (ref 0.44–1.00)
Chloride: 113 mmol/L — ABNORMAL HIGH (ref 101–111)
GFR calc non Af Amer: >60 mL/min (ref >60)
Glucose, Bld: 86 mg/dL (ref 65–99)
Potassium: 3.2 mmol/L — ABNORMAL LOW (ref 3.5–5.1)
SODIUM: 144 mmol/L (ref 135–145)

## 2016-05-15 LAB — URINE MICROSCOPIC-ADD ON

## 2016-05-15 LAB — CBG MONITORING, ED: Glucose-Capillary: 90 mg/dL (ref 65–99)

## 2016-05-15 LAB — I-STAT CG4 LACTIC ACID, ED
LACTIC ACID, VENOUS: 3.52 mmol/L — AB (ref 0.5–1.9)
Lactic Acid, Venous: 2.66 mmol/L (ref 0.5–1.9)

## 2016-05-15 MED ORDER — POTASSIUM CHLORIDE 10 MEQ/100ML IV SOLN
10.0000 meq | Freq: Once | INTRAVENOUS | Status: AC
  Administered 2016-05-15: 10 meq via INTRAVENOUS
  Filled 2016-05-15: qty 100

## 2016-05-15 MED ORDER — SODIUM CHLORIDE 0.9 % IV BOLUS (SEPSIS)
1000.0000 mL | Freq: Once | INTRAVENOUS | Status: AC
  Administered 2016-05-15: 1000 mL via INTRAVENOUS

## 2016-05-15 MED ORDER — ONDANSETRON 4 MG PO TBDP: 4.0000 mg | ORAL_TABLET | Freq: Three times a day (TID) | ORAL | 0 refills | Status: DC | PRN

## 2016-05-15 MED ORDER — FENTANYL CITRATE (PF) 100 MCG/2ML IJ SOLN
50.0000 ug | Freq: Once | INTRAMUSCULAR | Status: AC
  Administered 2016-05-15: 50 ug via INTRAVENOUS
  Filled 2016-05-15: qty 2

## 2016-05-15 MED ORDER — SODIUM CHLORIDE 0.9 % IV SOLN
1000.0000 mL | INTRAVENOUS | Status: DC
  Administered 2016-05-15: 1000 mL via INTRAVENOUS

## 2016-05-15 MED ORDER — HYDROMORPHONE HCL 1 MG/ML IJ SOLN
1.0000 mg | Freq: Once | INTRAMUSCULAR | Status: AC
  Administered 2016-05-15: 1 mg via INTRAVENOUS
  Filled 2016-05-15: qty 1

## 2016-05-15 MED ORDER — ONDANSETRON HCL 4 MG/2ML IJ SOLN
4.0000 mg | Freq: Once | INTRAMUSCULAR | Status: AC
  Administered 2016-05-15: 4 mg via INTRAVENOUS
  Filled 2016-05-15: qty 2

## 2016-05-15 MED ORDER — SODIUM CHLORIDE 0.9 % IV SOLN
1000.0000 mL | Freq: Once | INTRAVENOUS | Status: AC
  Administered 2016-05-15: 1000 mL via INTRAVENOUS

## 2016-05-15 NOTE — ED Provider Notes (Signed)
[facility]Y DEPT Provider Note   CSN: [id number] Arrival date & time: 05/15/16  0309     History   Chief Complaint Chief Complaint  Patient presents with  . Weakness  . Nausea    HPI [name] is a 55 y.o. female.  The history is provided by the patient.  She has a complicated medical history that includes chronic low back pain, Crohn's disease, diabetes, DVT, hypertension, hyperlipidemia. She complains of pain in her left lateral abdomen for the last week. Pain is sharp and severe and worse with movement. Is rated at 10/10. Over the last 36 hours, she has developed nausea and vomiting and has not been able to hold anything down. She denies constipation or diarrhea. She denies fever or chills.  Past Medical History:  Diagnosis Date  . Anxiety   . Arthritis    "a little bit; all over" (05/01/2016)  . Asthma   . Asthma   . C. difficile colitis   . Chronic lower back pain   . Crohn disease (HCC)   . Diabetes mellitus without complication (HCC)   . DVT (deep venous thrombosis) (HCC) ~ 03/2016   LLE  . Elevated lactic acid level   . GERD (gastroesophageal reflux disease)   . Glaucoma   . HLD (hyperlipidemia)   . Hypertension   . Hypothyroidism   . Neuropathy (HCC)   . Pancreatitis   . Pulmonary embolus (HCC) 05/24/2014  . Thrombocytopenia (HCC)   . Tobacco abuse     Patient Active Problem List   Diagnosis Date Noted  . Abdominal pain, generalized   . Abdominal pain   . AKI (acute kidney injury) (HCC)   . Sepsis (HCC)   . UTI (lower urinary tract infection) 02/28/2016  . 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 Excoriated rash-medial thighs 02/28/2016  . DVT (deep venous thrombosis), unspecified laterality 01/29/2016  . Bilateral tibial fractures   . Hypothyroidism   . GERD (gastroesophageal reflux disease)   . HLD (hyperlipidemia)   . Enteritis due to Clostridium difficile 03/23/2015  . Thrombocytopenia (HCC) 03/21/2015  . Weakness of back 03/21/2015  .  Chronic pain 02/28/2015  . Severe protein-calorie malnutrition (HCC) 12/30/2014  . Hepatic steatosis 12/29/2014  . Chronic diarrhea of unknown origin 12/29/2014  . Elevated lactic acid level   . Dehydration 07/26/2014  . Arterial hypotension 07/26/2014  . History of pulmonary embolus (PE) 05/24/2014  . Physical deconditioning 05/14/2014  . Nicotine abuse 05/08/2014  . Normocytic anemia 10/15/2012  . Metabolic acidosis 10/14/2012  . Diabetes mellitus with complication (HCC) 05/12/2007  . Anxiety state 05/12/2007  . Depression 05/12/2007  . Peripheral neuropathy (HCC) 05/12/2007  . HTN (hypertension) 05/12/2007  . ALLERGIC RHINITIS 05/12/2007  . Asthma 05/12/2007  . PANCREATITIS, HX OF 05/12/2007    Past Surgical History:  Procedure Laterality Date  . CESAREAN SECTION  1996  . COLONOSCOPY N/A 03/02/2015   Procedure: COLONOSCOPY;  Surgeon: Bernette Redbirdobert Buccini, MD;  [facility];  Service: Endoscopy;  Laterality: N/A;  . ESOPHAGOGASTRODUODENOSCOPY N/A 05/21/2014   Procedure: ESOPHAGOGASTRODUODENOSCOPY (EGD);  Surgeon: Petra KubaMarc E Magod, MD;  [facility];  Service: Endoscopy;  Laterality: N/A;  . ESOPHAGOGASTRODUODENOSCOPY N/A 03/02/2015   Procedure: ESOPHAGOGASTRODUODENOSCOPY (EGD);  Surgeon: Bernette Redbirdobert Buccini, MD;  [facility];  Service: Endoscopy;  Laterality: N/A;    OB History    No data available       Home Medications    Prior to Admission medications   Medication Sig Start Date End Date  Taking? Authorizing Provider  apixaban (ELIQUIS) 5 MG TABS tablet Take 1 tablet (5 mg total) by mouth 2 (two) times daily. 05/02/16  Yes Richarda Overlie, MD  calcium carbonate (OS-CAL - DOSED IN MG OF ELEMENTAL CALCIUM) 1250 (500 CA) MG tablet Take 1 tablet (500 mg of elemental calcium total) by mouth daily with breakfast. 03/04/15  Yes Alison Murray, MD  docusate sodium (COLACE) 100 MG capsule Take 2 capsules (200 mg total) by mouth 2 (two) times daily. 02/07/16  Yes Leroy Sea, MD  folic acid (FOLVITE) 1 MG tablet Take 1 tablet (1 mg total) by mouth daily. 03/06/16  Yes Richarda Overlie, MD  gabapentin (NEURONTIN) 100 MG capsule Take 1 capsule (100 mg total) by mouth 2 (two) times daily. 03/06/16  Yes Richarda Overlie, MD  HYDROcodone-acetaminophen (NORCO/VICODIN) 5-325 MG tablet Take 1 tablet by mouth every 6 (six) hours as needed. Patient taking differently: Take 1 tablet by mouth every 6 (six) hours as needed for moderate pain.  01/09/16  Yes Derwood Kaplan, MD  HYDROmorphone (DILAUDID) 4 MG tablet Take 8 mg by mouth every 4 (four) hours as needed for severe pain.   Yes Historical Provider, MD  levothyroxine (SYNTHROID, LEVOTHROID) 25 MCG tablet Take 1 tablet (25 mcg total) by mouth daily before breakfast. 03/06/16  Yes Richarda Overlie, MD  ondansetron (ZOFRAN ODT) 4 MG disintegrating tablet Take 1 tablet (4 mg total) by mouth every 8 (eight) hours as needed for nausea or vomiting. 01/09/16  Yes Ankit Rhunette Croft, MD  pantoprazole (PROTONIX) 20 MG tablet Take 2 tablets (40 mg total) by mouth daily. 03/06/16  Yes Richarda Overlie, MD  potassium chloride SA (K-DUR,KLOR-CON) 20 MEQ tablet Take 2 tablets (40 mEq total) by mouth daily. 05/02/16 05/22/16 Yes Richarda Overlie, MD  thiamine (VITAMIN B-1) 100 MG tablet Take 1 tablet (100 mg total) by mouth daily. 03/06/16  Yes Richarda Overlie, MD  traZODone (DESYREL) 25 mg TABS tablet Take 0.5 tablets (25 mg total) by mouth at bedtime as needed for sleep. 08/02/14  Yes Kathlen Mody, MD  venlafaxine (EFFEXOR) 25 MG tablet Take 1 tablet (25 mg total) by mouth 2 (two) times daily. 03/04/15  Yes Alison Murray, MD  feeding supplement, RESOURCE BREEZE, (RESOURCE BREEZE) LIQD Take 1 Container by mouth 3 (three) times daily between meals. Patient not taking: Reported on 05/15/2016 08/02/14   Kathlen Mody, MD  polyethylene glycol (MIRALAX / GLYCOLAX) packet Take 17 g by mouth daily. Patient not taking: Reported on 05/15/2016 02/07/16   Leroy Sea, MD  saccharomyces  boulardii (FLORASTOR) 250 MG capsule Take 1 capsule (250 mg total) by mouth 2 (two) times daily. Patient not taking: Reported on 05/15/2016 04/05/15   Vassie Loll, MD    Family History Family History  Problem Relation Age of Onset  . Breast cancer Mother   . Heart disease Mother 50    CABG  . Diabetes Mother   . Heart disease Father 93    CABG  . Diabetes Father     Social History Social History  Substance Use Topics  . Smoking status: Current Every Day Smoker    Packs/day: 0.25    Years: 37.00    Types: Cigarettes  . Smokeless tobacco: Never Used  . Alcohol use No     Allergies   Iohexol; Spiriva handihaler [tiotropium bromide monohydrate]; Iodinated diagnostic agents; Ativan [lorazepam]; and Penicillins   Review of Systems Review of Systems  All other systems reviewed and are negative.  Physical Exam Updated Vital Signs BP 107/72   Pulse 81   Temp 99.4 F (37.4 C) (Oral)   Resp 18   Ht 5\' 5"  (1.651 m)   Wt 90 lb (40.8 kg)   LMP 10/01/2010 Comment: Has depro  SpO2 100%   BMI 14.98 kg/m   Physical Exam  Nursing note and vitals reviewed.  Cachectic appearing 55 year old female, who appears uncomfortable, but is in no acute distress. Vital signs are normal. Oxygen saturation is 100%, which is normal. Head is normocephalic and atraumatic. PERRLA, EOMI. Oropharynx is clear. Neck is nontender and supple without adenopathy or JVD. Back is nontender and there is no CVA tenderness. Lungs are clear without rales, wheezes, or rhonchi. Chest is nontender. Heart has regular rate and rhythm without murmur. Abdomen is soft, flat, with severe tenderness in the left lateral abdomen but not involving the costovertebral angle area. No rashes seen in this area. No masses or hepatosplenomegaly and peristalsis is hypoactive. Extremities have no cyanosis or edema, full range of motion is present. Skin is warm and dry without rash. Neurologic: Mental status is normal,  cranial nerves are intact, there are no motor or sensory deficits.  ED Treatments / Results  Labs (all labs ordered are listed, but only abnormal results are displayed) Labs Reviewed  BASIC METABOLIC PANEL - Abnormal; Notable for the following:       Result Value   Potassium 3.2 (*)    Chloride 113 (*)    CO2 15 (*)    BUN <5 (*)    Calcium 7.6 (*)    Anion gap 16 (*)    All other components within normal limits  CBC - Abnormal; Notable for the following:    RBC 3.37 (*)    Hemoglobin 10.5 (*)    HCT 32.7 (*)    RDW 19.4 (*)    All other components within normal limits  URINALYSIS, ROUTINE W REFLEX MICROSCOPIC (NOT AT [facility]) - Abnormal; Notable for the following:    Color, Urine AMBER (*)    APPearance CLOUDY (*)    Bilirubin Urine SMALL (*)    Ketones, ur 15 (*)    Protein, ur 30 (*)    Leukocytes, UA SMALL (*)    All other components within normal limits  URINE MICROSCOPIC-ADD ON - Abnormal; Notable for the following:    Squamous Epithelial / LPF 6-30 (*)    Bacteria, UA MANY (*)    Casts HYALINE CASTS (*)    All other components within normal limits  URINE CULTURE  CBG MONITORING, ED  I-STAT CG4 LACTIC ACID, ED    EKG  EKG Interpretation  Date/Time:  Tuesday May 15 2016 03:15:04 EDT Ventricular Rate:  124 PR Interval:    QRS Duration: 61 QT Interval:  326 QTC Calculation: 469 R Axis:   22 Text Interpretation:  Multifocal atrial tachycardia Anteroseptal infarct, age indeterminate When compared with ECG of 05/01/2016, No significant change was found Confirmed by [facility]  MD, Alexsis Branscom ([id number]) on 05/15/2016 3:20:29 AM       Radiology No results found.  Procedures Procedures (including critical care time)  Medications Ordered in ED Medications  0.9 %  sodium chloride infusion (not administered)    Followed by  0.9 %  sodium chloride infusion (not administered)  ondansetron (ZOFRAN) injection 4 mg (not administered)  HYDROmorphone (DILAUDID) injection 1 mg  (not administered)  potassium chloride 10 mEq in 100 mL IVPB (not administered)     Initial  Impression / Assessment and Plan / ED Course  I have reviewed the triage vital signs and the nursing notes.  Pertinent labs & imaging results that were available during my care of the patient were reviewed by me and considered in my medical decision making (see chart for details).  Clinical Course    Fairly well localized pain in the left lateral abdomen with associated nausea and vomiting. Old records are reviewed and she has recent hospitalization for dehydration and another hospitalization for sepsis. Laboratory workup shows metabolic acidosis with CO2 of 15. CO2 has been running low recently, but not this low. Anion gap is minimally elevated at 16. Urinalysis is a contaminated specimen, but may indicate urinary tract infection. Specimen is sent for culture. She will be sent for CT of abdomen and pelvis. She is given IV fluids, potassium, hydromorphone, ondansetron.  She feels marginally better after above noted treatment. Lactic acid level has come back elevated, but that has also been elevated with recent visits. Today's level is actually lower than other recent values. She'll be given IV fluids and lactic acid level rechecked. Abdominal CT was negative for anything that might be causing her pain. However, she now is telling me that she has a sensation that her neck and head are on fire and this is followed by episodes of emesis. Nothing on her exam or account for this. Case is signed out to Dr. Jeraldine Loots to evaluate her response to IV hydration.  Final Clinical Impressions(s) / ED Diagnoses   Final diagnoses:  Left flank pain  Metabolic acidosis  Hypokalemia  Lung nodule  Non-intractable vomiting with nausea, vomiting of unspecified type    New Prescriptions New Prescriptions   No medications on file     Dione Booze, MD 05/16/16 [phone]

## 2016-05-15 NOTE — ED Triage Notes (Signed)
Pt in EMS reports L sided pain, ran out of pain meds 3 days ago. Also reports N/V X1 day.

## 2016-05-15 NOTE — ED Notes (Addendum)
Gave Pt ginger ale and ice per [employer]

## 2016-05-15 NOTE — ED Notes (Signed)
Asked pt if family member could provide transportation or if she could go home via cab which she denied. Informed pt that we could call [facility] but she may receive a bill.

## 2016-05-15 NOTE — ED Provider Notes (Signed)
12:22 PM Patient calm, sitting upright, in no distress. Lactic acid has reduced since original value. I discussed findings thus far, encouraged patient to follow-up with her primary care physician for continued evaluation.    Gerhard Munch, MD 05/15/16 [phone]

## 2016-05-16 LAB — URINE CULTURE

## 2016-05-24 DIAGNOSIS — I82442 Acute embolism and thrombosis of left tibial vein: Secondary | ICD-10-CM | POA: Diagnosis not present

## 2016-05-24 DIAGNOSIS — I82412 Acute embolism and thrombosis of left femoral vein: Secondary | ICD-10-CM | POA: Diagnosis not present

## 2016-05-24 DIAGNOSIS — N390 Urinary tract infection, site not specified: Secondary | ICD-10-CM | POA: Diagnosis not present

## 2016-05-24 DIAGNOSIS — E1140 Type 2 diabetes mellitus with diabetic neuropathy, unspecified: Secondary | ICD-10-CM | POA: Diagnosis not present

## 2016-05-24 DIAGNOSIS — I82432 Acute embolism and thrombosis of left popliteal vein: Secondary | ICD-10-CM | POA: Diagnosis not present

## 2016-05-24 DIAGNOSIS — I82492 Acute embolism and thrombosis of other specified deep vein of left lower extremity: Secondary | ICD-10-CM | POA: Diagnosis not present

## 2016-05-29 DIAGNOSIS — I82492 Acute embolism and thrombosis of other specified deep vein of left lower extremity: Secondary | ICD-10-CM | POA: Diagnosis not present

## 2016-05-29 DIAGNOSIS — I82432 Acute embolism and thrombosis of left popliteal vein: Secondary | ICD-10-CM | POA: Diagnosis not present

## 2016-05-29 DIAGNOSIS — I82412 Acute embolism and thrombosis of left femoral vein: Secondary | ICD-10-CM | POA: Diagnosis not present

## 2016-05-29 DIAGNOSIS — N390 Urinary tract infection, site not specified: Secondary | ICD-10-CM | POA: Diagnosis not present

## 2016-05-29 DIAGNOSIS — I82442 Acute embolism and thrombosis of left tibial vein: Secondary | ICD-10-CM | POA: Diagnosis not present

## 2016-05-29 DIAGNOSIS — E1140 Type 2 diabetes mellitus with diabetic neuropathy, unspecified: Secondary | ICD-10-CM | POA: Diagnosis not present

## 2016-05-31 DIAGNOSIS — I82492 Acute embolism and thrombosis of other specified deep vein of left lower extremity: Secondary | ICD-10-CM | POA: Diagnosis not present

## 2016-05-31 DIAGNOSIS — I82412 Acute embolism and thrombosis of left femoral vein: Secondary | ICD-10-CM | POA: Diagnosis not present

## 2016-05-31 DIAGNOSIS — I82442 Acute embolism and thrombosis of left tibial vein: Secondary | ICD-10-CM | POA: Diagnosis not present

## 2016-05-31 DIAGNOSIS — I82432 Acute embolism and thrombosis of left popliteal vein: Secondary | ICD-10-CM | POA: Diagnosis not present

## 2016-05-31 DIAGNOSIS — E1140 Type 2 diabetes mellitus with diabetic neuropathy, unspecified: Secondary | ICD-10-CM | POA: Diagnosis not present

## 2016-05-31 DIAGNOSIS — N390 Urinary tract infection, site not specified: Secondary | ICD-10-CM | POA: Diagnosis not present

## 2016-06-05 DIAGNOSIS — I82492 Acute embolism and thrombosis of other specified deep vein of left lower extremity: Secondary | ICD-10-CM | POA: Diagnosis not present

## 2016-06-05 DIAGNOSIS — E1140 Type 2 diabetes mellitus with diabetic neuropathy, unspecified: Secondary | ICD-10-CM | POA: Diagnosis not present

## 2016-06-05 DIAGNOSIS — I82412 Acute embolism and thrombosis of left femoral vein: Secondary | ICD-10-CM | POA: Diagnosis not present

## 2016-06-05 DIAGNOSIS — I82432 Acute embolism and thrombosis of left popliteal vein: Secondary | ICD-10-CM | POA: Diagnosis not present

## 2016-06-05 DIAGNOSIS — I82442 Acute embolism and thrombosis of left tibial vein: Secondary | ICD-10-CM | POA: Diagnosis not present

## 2016-06-05 DIAGNOSIS — N390 Urinary tract infection, site not specified: Secondary | ICD-10-CM | POA: Diagnosis not present

## 2016-06-07 DIAGNOSIS — I82442 Acute embolism and thrombosis of left tibial vein: Secondary | ICD-10-CM | POA: Diagnosis not present

## 2016-06-07 DIAGNOSIS — I82432 Acute embolism and thrombosis of left popliteal vein: Secondary | ICD-10-CM | POA: Diagnosis not present

## 2016-06-07 DIAGNOSIS — I82492 Acute embolism and thrombosis of other specified deep vein of left lower extremity: Secondary | ICD-10-CM | POA: Diagnosis not present

## 2016-06-07 DIAGNOSIS — I82412 Acute embolism and thrombosis of left femoral vein: Secondary | ICD-10-CM | POA: Diagnosis not present

## 2016-06-14 DIAGNOSIS — I82442 Acute embolism and thrombosis of left tibial vein: Secondary | ICD-10-CM | POA: Diagnosis not present

## 2016-06-14 DIAGNOSIS — I82492 Acute embolism and thrombosis of other specified deep vein of left lower extremity: Secondary | ICD-10-CM | POA: Diagnosis not present

## 2016-06-14 DIAGNOSIS — I82432 Acute embolism and thrombosis of left popliteal vein: Secondary | ICD-10-CM | POA: Diagnosis not present

## 2016-06-14 DIAGNOSIS — I10 Essential (primary) hypertension: Secondary | ICD-10-CM | POA: Diagnosis not present

## 2016-06-14 DIAGNOSIS — I82412 Acute embolism and thrombosis of left femoral vein: Secondary | ICD-10-CM | POA: Diagnosis not present

## 2016-06-14 DIAGNOSIS — E1140 Type 2 diabetes mellitus with diabetic neuropathy, unspecified: Secondary | ICD-10-CM | POA: Diagnosis not present

## 2016-06-19 DIAGNOSIS — I82432 Acute embolism and thrombosis of left popliteal vein: Secondary | ICD-10-CM | POA: Diagnosis not present

## 2016-06-19 DIAGNOSIS — I82492 Acute embolism and thrombosis of other specified deep vein of left lower extremity: Secondary | ICD-10-CM | POA: Diagnosis not present

## 2016-06-19 DIAGNOSIS — I10 Essential (primary) hypertension: Secondary | ICD-10-CM | POA: Diagnosis not present

## 2016-06-19 DIAGNOSIS — I82412 Acute embolism and thrombosis of left femoral vein: Secondary | ICD-10-CM | POA: Diagnosis not present

## 2016-06-19 DIAGNOSIS — I82442 Acute embolism and thrombosis of left tibial vein: Secondary | ICD-10-CM | POA: Diagnosis not present

## 2016-06-19 DIAGNOSIS — E1140 Type 2 diabetes mellitus with diabetic neuropathy, unspecified: Secondary | ICD-10-CM | POA: Diagnosis not present

## 2016-06-21 DIAGNOSIS — I82442 Acute embolism and thrombosis of left tibial vein: Secondary | ICD-10-CM | POA: Diagnosis not present

## 2016-06-21 DIAGNOSIS — I10 Essential (primary) hypertension: Secondary | ICD-10-CM | POA: Diagnosis not present

## 2016-06-21 DIAGNOSIS — E1140 Type 2 diabetes mellitus with diabetic neuropathy, unspecified: Secondary | ICD-10-CM | POA: Diagnosis not present

## 2016-06-21 DIAGNOSIS — I82492 Acute embolism and thrombosis of other specified deep vein of left lower extremity: Secondary | ICD-10-CM | POA: Diagnosis not present

## 2016-06-21 DIAGNOSIS — I82432 Acute embolism and thrombosis of left popliteal vein: Secondary | ICD-10-CM | POA: Diagnosis not present

## 2016-06-21 DIAGNOSIS — I82412 Acute embolism and thrombosis of left femoral vein: Secondary | ICD-10-CM | POA: Diagnosis not present

## 2016-06-25 ENCOUNTER — Emergency Department (HOSPITAL_COMMUNITY): Payer: Medicare Other

## 2016-06-25 ENCOUNTER — Inpatient Hospital Stay (HOSPITAL_COMMUNITY)
Admission: EM | Admit: 2016-06-25 | Discharge: 2016-07-01 | DRG: 896 | Disposition: A | Discharge status: Home / Self Care | Payer: Medicare Other | Attending: Internal Medicine | Admitting: Internal Medicine

## 2016-06-25 DIAGNOSIS — I10 Essential (primary) hypertension: Secondary | ICD-10-CM

## 2016-06-25 DIAGNOSIS — R4182 Altered mental status, unspecified: Secondary | ICD-10-CM | POA: Diagnosis not present

## 2016-06-25 DIAGNOSIS — Z681 Body mass index (BMI) 19 or less, adult: Secondary | ICD-10-CM

## 2016-06-25 DIAGNOSIS — K219 Gastro-esophageal reflux disease without esophagitis: Secondary | ICD-10-CM | POA: Diagnosis present

## 2016-06-25 DIAGNOSIS — E860 Dehydration: Secondary | ICD-10-CM | POA: Diagnosis present

## 2016-06-25 DIAGNOSIS — L89152 Pressure ulcer of sacral region, stage 2: Secondary | ICD-10-CM | POA: Diagnosis present

## 2016-06-25 DIAGNOSIS — F419 Anxiety disorder, unspecified: Secondary | ICD-10-CM | POA: Diagnosis present

## 2016-06-25 DIAGNOSIS — E1142 Type 2 diabetes mellitus with diabetic polyneuropathy: Secondary | ICD-10-CM | POA: Diagnosis present

## 2016-06-25 DIAGNOSIS — G92 Toxic encephalopathy: Secondary | ICD-10-CM | POA: Diagnosis not present

## 2016-06-25 DIAGNOSIS — Z86711 Personal history of pulmonary embolism: Secondary | ICD-10-CM | POA: Diagnosis present

## 2016-06-25 DIAGNOSIS — L8990 Pressure ulcer of unspecified site, unspecified stage: Secondary | ICD-10-CM | POA: Insufficient documentation

## 2016-06-25 DIAGNOSIS — R410 Disorientation, unspecified: Secondary | ICD-10-CM

## 2016-06-25 DIAGNOSIS — R64 Cachexia: Secondary | ICD-10-CM | POA: Diagnosis not present

## 2016-06-25 DIAGNOSIS — Z91041 Radiographic dye allergy status: Secondary | ICD-10-CM

## 2016-06-25 DIAGNOSIS — E118 Type 2 diabetes mellitus with unspecified complications: Secondary | ICD-10-CM

## 2016-06-25 DIAGNOSIS — Z833 Family history of diabetes mellitus: Secondary | ICD-10-CM

## 2016-06-25 DIAGNOSIS — K5090 Crohn's disease, unspecified, without complications: Secondary | ICD-10-CM | POA: Diagnosis not present

## 2016-06-25 DIAGNOSIS — R1319 Other dysphagia: Secondary | ICD-10-CM

## 2016-06-25 DIAGNOSIS — F329 Major depressive disorder, single episode, unspecified: Secondary | ICD-10-CM | POA: Diagnosis present

## 2016-06-25 DIAGNOSIS — E039 Hypothyroidism, unspecified: Secondary | ICD-10-CM | POA: Diagnosis present

## 2016-06-25 DIAGNOSIS — D638 Anemia in other chronic diseases classified elsewhere: Secondary | ICD-10-CM | POA: Diagnosis present

## 2016-06-25 DIAGNOSIS — Z888 Allergy status to other drugs, medicaments and biological substances status: Secondary | ICD-10-CM

## 2016-06-25 DIAGNOSIS — R451 Restlessness and agitation: Secondary | ICD-10-CM | POA: Diagnosis not present

## 2016-06-25 DIAGNOSIS — Z86718 Personal history of other venous thrombosis and embolism: Secondary | ICD-10-CM

## 2016-06-25 DIAGNOSIS — F17210 Nicotine dependence, cigarettes, uncomplicated: Secondary | ICD-10-CM | POA: Diagnosis present

## 2016-06-25 DIAGNOSIS — H409 Unspecified glaucoma: Secondary | ICD-10-CM | POA: Diagnosis present

## 2016-06-25 DIAGNOSIS — F609 Personality disorder, unspecified: Secondary | ICD-10-CM | POA: Diagnosis present

## 2016-06-25 DIAGNOSIS — R7989 Other specified abnormal findings of blood chemistry: Secondary | ICD-10-CM | POA: Diagnosis present

## 2016-06-25 DIAGNOSIS — E876 Hypokalemia: Secondary | ICD-10-CM | POA: Diagnosis present

## 2016-06-25 DIAGNOSIS — G9340 Encephalopathy, unspecified: Secondary | ICD-10-CM

## 2016-06-25 DIAGNOSIS — Z7901 Long term (current) use of anticoagulants: Secondary | ICD-10-CM

## 2016-06-25 DIAGNOSIS — Z9114 Patient's other noncompliance with medication regimen: Secondary | ICD-10-CM

## 2016-06-25 DIAGNOSIS — R52 Pain, unspecified: Secondary | ICD-10-CM | POA: Diagnosis not present

## 2016-06-25 DIAGNOSIS — M545 Low back pain: Secondary | ICD-10-CM | POA: Diagnosis present

## 2016-06-25 DIAGNOSIS — R636 Underweight: Secondary | ICD-10-CM | POA: Diagnosis present

## 2016-06-25 DIAGNOSIS — E785 Hyperlipidemia, unspecified: Secondary | ICD-10-CM

## 2016-06-25 DIAGNOSIS — Z8249 Family history of ischemic heart disease and other diseases of the circulatory system: Secondary | ICD-10-CM

## 2016-06-25 DIAGNOSIS — G629 Polyneuropathy, unspecified: Secondary | ICD-10-CM

## 2016-06-25 DIAGNOSIS — D649 Anemia, unspecified: Secondary | ICD-10-CM | POA: Diagnosis present

## 2016-06-25 DIAGNOSIS — F23 Brief psychotic disorder: Secondary | ICD-10-CM | POA: Diagnosis not present

## 2016-06-25 DIAGNOSIS — Z79899 Other long term (current) drug therapy: Secondary | ICD-10-CM

## 2016-06-25 DIAGNOSIS — F29 Unspecified psychosis not due to a substance or known physiological condition: Secondary | ICD-10-CM | POA: Diagnosis not present

## 2016-06-25 DIAGNOSIS — G8929 Other chronic pain: Secondary | ICD-10-CM | POA: Diagnosis present

## 2016-06-25 DIAGNOSIS — Z880 Allergy status to penicillin: Secondary | ICD-10-CM

## 2016-06-25 DIAGNOSIS — F1096 Alcohol use, unspecified with alcohol-induced persisting amnestic disorder: Principal | ICD-10-CM | POA: Diagnosis present

## 2016-06-25 DIAGNOSIS — Z5320 Procedure and treatment not carried out because of patient's decision for unspecified reasons: Secondary | ICD-10-CM | POA: Diagnosis not present

## 2016-06-25 LAB — COMPREHENSIVE METABOLIC PANEL
ALK PHOS: 106 U/L (ref 38–126)
ALT: 10 U/L — ABNORMAL LOW (ref 14–54)
ANION GAP: 9 (ref 5–15)
AST: 39 U/L (ref 15–41)
Albumin: 2 g/dL — ABNORMAL LOW (ref 3.5–5.0)
BILIRUBIN TOTAL: 1.3 mg/dL — AB (ref 0.3–1.2)
BUN: <5 mg/dL — ABNORMAL LOW (ref 6–20)
CALCIUM: 7.2 mg/dL — AB (ref 8.9–10.3)
CO2: 25 mmol/L (ref 22–32)
Chloride: 109 mmol/L (ref 101–111)
Creatinine, Ser: 0.61 mg/dL (ref 0.44–1.00)
GFR calc non Af Amer: >60 mL/min (ref >60)
Glucose, Bld: 75 mg/dL (ref 65–99)
Potassium: 3.8 mmol/L (ref 3.5–5.1)
Sodium: 143 mmol/L (ref 135–145)
TOTAL PROTEIN: 4.8 g/dL — AB (ref 6.5–8.1)

## 2016-06-25 LAB — CBC WITH DIFFERENTIAL/PLATELET
BASOS ABS: 0.1 10*3/uL (ref 0.0–0.1)
BASOS PCT: 1 %
Eosinophils Absolute: 0.1 10*3/uL (ref 0.0–0.7)
Eosinophils Relative: 1 %
HEMATOCRIT: 31.8 % — AB (ref 36.0–46.0)
Hemoglobin: 11.1 g/dL — ABNORMAL LOW (ref 12.0–15.0)
Lymphocytes Relative: 32 %
Lymphs Abs: 2.4 10*3/uL (ref 0.7–4.0)
MCH: 33.9 pg (ref 26.0–34.0)
MCHC: 34.9 g/dL (ref 30.0–36.0)
MCV: 97.2 fL (ref 78.0–100.0)
MONO ABS: 0.5 10*3/uL (ref 0.1–1.0)
Monocytes Relative: 7 %
NEUTROS ABS: 4.4 10*3/uL (ref 1.7–7.7)
NEUTROS PCT: 59 %
Platelets: 161 10*3/uL (ref 150–400)
RBC: 3.27 MIL/uL — ABNORMAL LOW (ref 3.87–5.11)
RDW: 15 % (ref 11.5–15.5)
WBC: 7.4 10*3/uL (ref 4.0–10.5)

## 2016-06-25 LAB — CBG MONITORING, ED: GLUCOSE-CAPILLARY: 70 mg/dL (ref 65–99)

## 2016-06-25 LAB — LIPASE, BLOOD: LIPASE: 12 U/L (ref 11–51)

## 2016-06-25 LAB — I-STAT CG4 LACTIC ACID, ED: LACTIC ACID, VENOUS: 1.82 mmol/L (ref 0.5–1.9)

## 2016-06-25 MED ORDER — SODIUM CHLORIDE 0.9 % IV SOLN
Freq: Once | INTRAVENOUS | Status: AC
  Administered 2016-06-25: 21:00:00 via INTRAVENOUS

## 2016-06-25 NOTE — ED Notes (Signed)
Pt refused blood work  

## 2016-06-25 NOTE — ED Triage Notes (Signed)
Per EMS, pt from home, reports gen body aches and weakness.  Pt's son reports pt broke bila legs x 2 months refused rehab.  CBG-69 on scene.  Pt is A&Ox 4.  Refused tx from EMS but agreed to be transported to the ED.

## 2016-06-25 NOTE — ED Notes (Signed)
Provider notified that pt is refusing to have any blood work or vital signs obtained. Pt repeatedly stated that she was cold even with blankets applied.

## 2016-06-25 NOTE — ED Notes (Signed)
RN attempted to obtain vital signs. Patient stating she does not want vital signs to be taken. RN asking patient triage questions and patient not answering any questions. Unable to assess patient at this time.

## 2016-06-25 NOTE — ED Notes (Signed)
Family attempted to be notified but no answer from family.

## 2016-06-25 NOTE — ED Notes (Signed)
Pt allowed staff to obtain CBG at this time but still refused to have blood work or vital signs obtained. Provider made aware of CBG 70.

## 2016-06-25 NOTE — ED Notes (Signed)
Pt returned from radiology and refused to have vital signs done or have blood work obtained at this time. Provider to be notified.

## 2016-06-25 NOTE — ED Provider Notes (Addendum)
[facility]-EMERGENCY DEPT Provider Note   CSN: [id number] Arrival date & time: 06/25/16  1821  By signing my name below, I, Linna DarnerRussell Turner, attest that this documentation has been prepared under the direction and in the presence of Earley FavorGail Stephone Gum, NP. Electronically Signed: Linna Darnerussell Turner, Scribe. 06/25/2016. 8:11 PM.  History   Chief Complaint Chief Complaint  Patient presents with  . Weakness  . Generalized Body Aches   The history is provided by the patient. No language interpreter was used.     HPI Comments: LEVEL 5 CAVEAT FOR MENTAL STATUS CHANGE [name] is a 55 y.o. female brought in by EMS, with PMHx of neuropathy, DM, arthritis, and chronic pain, who presents to the Emergency Department with confusion. Per the nursing staff, pt's son called EMS because patient has become increasingly confused lately. Nursing staff reports that pt's son informed them that pt has not been attending her regular therapy (unspecified) lately. Nursing staff also reports patient's blood glucose was measured at 69 when EMS arrived. Pt reports generalized pain as well as fever and chills. She states her bilateral knees, bilateral lower back, and left flank are in the most severe pain. She states she fell recently. No other complaints or symptoms.  Past Medical History:  Diagnosis Date  . Anxiety   . Arthritis    "a little bit; all over" (05/01/2016)  . Asthma   . Asthma   . C. difficile colitis   . Chronic lower back pain   . Crohn disease (HCC)   . Diabetes mellitus without complication (HCC)   . DVT (deep venous thrombosis) (HCC) ~ 03/2016   LLE  . Elevated lactic acid level   . GERD (gastroesophageal reflux disease)   . Glaucoma   . HLD (hyperlipidemia)   . Hypertension   . Hypothyroidism   . Neuropathy (HCC)   . Pancreatitis   . Pulmonary embolus (HCC) 05/24/2014  . Thrombocytopenia (HCC)   . Tobacco abuse     Patient Active Problem List   Diagnosis Date Noted  . Delirium 06/26/2016  .  Pressure injury of skin 06/26/2016  . Abdominal pain, generalized   . Abdominal pain   . AKI (acute kidney injury) (HCC)   . Sepsis (HCC)   . UTI (lower urinary tract infection) 02/28/2016  . 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 Excoriated rash-medial thighs 02/28/2016  . DVT (deep venous thrombosis), unspecified laterality 01/29/2016  . Bilateral tibial fractures   . Hypothyroidism   . GERD (gastroesophageal reflux disease)   . HLD (hyperlipidemia)   . Enteritis due to Clostridium difficile 03/23/2015  . Thrombocytopenia (HCC) 03/21/2015  . Weakness of back 03/21/2015  . Chronic pain 02/28/2015  . Severe protein-calorie malnutrition (HCC) 12/30/2014  . Hepatic steatosis 12/29/2014  . Chronic diarrhea of unknown origin 12/29/2014  . Elevated lactic acid level   . Dehydration 07/26/2014  . Arterial hypotension 07/26/2014  . History of pulmonary embolus (PE) 05/24/2014  . Physical deconditioning 05/14/2014  . Nicotine abuse 05/08/2014  . Normocytic anemia 10/15/2012  . Metabolic acidosis 10/14/2012  . Diabetes mellitus with complication (HCC) 05/12/2007  . Anxiety state 05/12/2007  . Depression 05/12/2007  . Peripheral neuropathy (HCC) 05/12/2007  . HTN (hypertension) 05/12/2007  . ALLERGIC RHINITIS 05/12/2007  . Asthma 05/12/2007  . PANCREATITIS, HX OF 05/12/2007    Past Surgical History:  Procedure Laterality Date  . CESAREAN SECTION  1996  . COLONOSCOPY N/A 03/02/2015   Procedure: COLONOSCOPY;  Surgeon: Bernette Redbirdobert Buccini, MD;  [facility] END[facility]COPY;  Service: Endoscopy;  Laterality: N/A;  . ESOPHAGOGASTRODUODENOSCOPY N/A 05/21/2014   Procedure: ESOPHAGOGASTRODUODENOSCOPY (EGD);  Surgeon: Petra Kuba, MD;  Location[facility] ENDOSCOPY;  Service: Endoscopy;  Laterality: N/A;  . ESOPHAGOGASTRODUODENOSCOPY N/A 03/02/2015   Procedure: ESOPHAGOGASTRODUODENOSCOPY (EGD);  Surgeon: Bernette Redbird, MD;  Location: [facility] ENDOSCOPY;  Service: Endoscopy;  Laterality: N/A;    OB History    No data  available       Home Medications    Prior to Admission medications   Medication Sig Start Date End Date Taking? Authorizing Provider  apixaban (ELIQUIS) 5 MG TABS tablet Take 1 tablet (5 mg total) by mouth 2 (two) times daily. 05/02/16  Yes Richarda Overlie, MD  calcium carbonate (OS-CAL - DOSED IN MG OF ELEMENTAL CALCIUM) 1250 (500 CA) MG tablet Take 1 tablet (500 mg of elemental calcium total) by mouth daily with breakfast. 03/04/15   Alison Murray, MD  docusate sodium (COLACE) 100 MG capsule Take 2 capsules (200 mg total) by mouth 2 (two) times daily. 02/07/16   Leroy Sea, MD  feeding supplement, RESOURCE BREEZE, (RESOURCE BREEZE) LIQD Take 1 Container by mouth 3 (three) times daily between meals. Patient not taking: Reported on 06/25/2016 08/02/14   Kathlen Mody, MD  folic acid (FOLVITE) 1 MG tablet Take 1 tablet (1 mg total) by mouth daily. 03/06/16   Richarda Overlie, MD  gabapentin (NEURONTIN) 100 MG capsule Take 1 capsule (100 mg total) by mouth 2 (two) times daily. 03/06/16   Richarda Overlie, MD  HYDROcodone-acetaminophen (NORCO) 10-325 MG tablet TAKE 1 TAB BY MOUTH 3 TIMES DAILY AS NEEDED FOR PAIN 05/16/16   Historical Provider, MD  HYDROcodone-acetaminophen (NORCO/VICODIN) 5-325 MG tablet Take 1 tablet by mouth every 6 (six) hours as needed. Patient not taking: Reported on 06/25/2016 01/09/16   Derwood Kaplan, MD  HYDROmorphone (DILAUDID) 4 MG tablet Take 8 mg by mouth every 4 (four) hours as needed for severe pain.    Historical Provider, MD  levothyroxine (SYNTHROID, LEVOTHROID) 25 MCG tablet Take 1 tablet (25 mcg total) by mouth daily before breakfast. 03/06/16   Richarda Overlie, MD  ondansetron (ZOFRAN ODT) 4 MG disintegrating tablet Take 1 tablet (4 mg total) by mouth every 8 (eight) hours as needed for nausea or vomiting. 05/15/16   Gerhard Munch, MD  pantoprazole (PROTONIX) 20 MG tablet Take 2 tablets (40 mg total) by mouth daily. 03/06/16   Richarda Overlie, MD  polyethylene glycol (MIRALAX / GLYCOLAX)  packet Take 17 g by mouth daily. Patient not taking: Reported on 06/25/2016 02/07/16   Leroy Sea, MD  potassium chloride SA (K-DUR,KLOR-CON) 20 MEQ tablet Take 2 tablets (40 mEq total) by mouth daily. 05/02/16 05/22/16  Richarda Overlie, MD  saccharomyces boulardii (FLORASTOR) 250 MG capsule Take 1 capsule (250 mg total) by mouth 2 (two) times daily. Patient not taking: Reported on 06/25/2016 04/05/15   Vassie Loll, MD  thiamine (VITAMIN B-1) 100 MG tablet Take 1 tablet (100 mg total) by mouth daily. 03/06/16   Richarda Overlie, MD  traZODone (DESYREL) 25 mg TABS tablet Take 0.5 tablets (25 mg total) by mouth at bedtime as needed for sleep. 08/02/14   Kathlen Mody, MD  venlafaxine (EFFEXOR) 25 MG tablet Take 1 tablet (25 mg total) by mouth 2 (two) times daily. 03/04/15   Alison Murray, MD    Family History Family History  Problem Relation Age of Onset  . Breast cancer Mother   . Heart disease Mother 23    CABG  .  Diabetes Mother   . Heart disease Father 11    CABG  . Diabetes Father     Social History Social History  Substance Use Topics  . Smoking status: Current Every Day Smoker    Packs/day: 0.25    Years: 37.00    Types: Cigarettes  . Smokeless tobacco: Never Used  . Alcohol use No     Allergies   Iohexol; Spiriva handihaler [tiotropium bromide monohydrate]; Iodinated diagnostic agents; Ativan [lorazepam]; and Penicillins   Review of Systems Review of Systems  Unable to perform ROS: Mental status change  Constitutional: Negative for chills and fever.  Genitourinary: Positive for flank pain (left).  Musculoskeletal: Positive for arthralgias and back pain. Negative for myalgias.  All other systems reviewed and are negative.   Physical Exam Updated Vital Signs BP 118/80 (BP Location: Right Arm)   Pulse 92   Temp 98.7 F (37.1 C) (Oral)   Resp 18   Ht 5\' 6"  (1.676 m)   Wt 40.1 kg   LMP 10/01/2010 Comment: Has depro  SpO2 99%   BMI 14.27 kg/m   Physical Exam   Constitutional: She appears cachectic. She appears ill.  Neck: Normal range of motion.  Cardiovascular: Normal rate.   Pulmonary/Chest: Effort normal. She exhibits tenderness.  Abdominal: There is tenderness.  Musculoskeletal: She exhibits tenderness.  Neurological: She is disoriented.  Skin: Skin is warm and dry. No rash noted.  Nursing note and vitals reviewed.    ED Treatments / Results  Labs (all labs ordered are listed, but only abnormal results are displayed) Labs Reviewed  CBC WITH DIFFERENTIAL/PLATELET - Abnormal; Notable for the following:       Result Value   RBC 3.27 (*)    Hemoglobin 11.1 (*)    HCT 31.8 (*)    All other components within normal limits  COMPREHENSIVE METABOLIC PANEL - Abnormal; Notable for the following:    BUN <5 (*)    Calcium 7.2 (*)    Total Protein 4.8 (*)    Albumin 2.0 (*)    ALT 10 (*)    Total Bilirubin 1.3 (*)    All other components within normal limits  URINALYSIS, ROUTINE W REFLEX MICROSCOPIC (NOT AT [facility]) - Abnormal; Notable for the following:    Color, Urine AMBER (*)    Bilirubin Urine SMALL (*)    Ketones, ur 15 (*)    All other components within normal limits  URINE RAPID DRUG SCREEN, HOSP PERFORMED - Abnormal; Notable for the following:    Opiates POSITIVE (*)    All other components within normal limits  ACETAMINOPHEN LEVEL - Abnormal; Notable for the following:    Acetaminophen (Tylenol), Serum <10 (*)    All other components within normal limits  APTT - Abnormal; Notable for the following:    aPTT 104 (*)    All other components within normal limits  APTT - Abnormal; Notable for the following:    aPTT 196 (*)    All other components within normal limits  HEPARIN LEVEL (UNFRACTIONATED) - Abnormal; Notable for the following:    Heparin Unfractionated 1.06 (*)    All other components within normal limits  I-STAT CG4 LACTIC ACID, ED - Abnormal; Notable for the following:    Lactic Acid, Venous 3.30 (*)    All other  components within normal limits  C DIFFICILE QUICK SCREEN W PCR REFLEX  LIPASE, BLOOD  SALICYLATE LEVEL  HEPARIN LEVEL (UNFRACTIONATED)  CBC  I-STAT CG4 LACTIC ACID, ED  CBG MONITORING, ED  I-STAT CG4 LACTIC ACID, ED    EKG  EKG Interpretation None       Radiology No results found.  Procedures Procedures (including critical care time)  DIAGNOSTIC STUDIES: Oxygen Saturation is 100% on RA, normal by my interpretation.    Medications Ordered in ED Medications  chlorhexidine (PERIDEX) 0.12 % solution 15 mL (15 mLs Mouth Rinse Not Given 06/28/16 1006)  MEDLINE mouth rinse (15 mLs Mouth Rinse Not Given 06/28/16 1600)  HYDROmorphone (DILAUDID) injection 1 mg (1 mg Intravenous Given 06/27/16 0915)  apixaban (ELIQUIS) tablet 5 mg (5 mg Oral Given 06/28/16 1004)  thiamine (VITAMIN B-1) tablet 100 mg (not administered)  folic acid (FOLVITE) tablet 1 mg (1 mg Oral Given 06/28/16 1330)  diphenoxylate-atropine (LOMOTIL) 2.5-0.025 MG per tablet 1 tablet (1 tablet Oral Given 06/28/16 1804)  oxyCODONE-acetaminophen (PERCOCET/ROXICET) 5-325 MG per tablet 1 tablet (1 tablet Oral Given 06/28/16 1804)  0.9 %  sodium chloride infusion ( Intravenous Stopped 06/25/16 2230)  sodium chloride 0.9 % bolus 2,000 mL (2,000 mLs Intravenous New Bag/Given 06/26/16 0529)  sodium chloride 0.9 % bolus 1,000 mL (1,000 mLs Intravenous Given 06/26/16 0645)  haloperidol lactate (HALDOL) injection 2.5 mg (2.5 mg Intravenous Given 06/27/16 0336)  haloperidol lactate (HALDOL) injection 5 mg (5 mg Intramuscular Given 06/27/16 2246)  diphenoxylate-atropine (LOMOTIL) 2.5-0.025 MG per tablet 2 tablet (2 tablets Oral Given 06/27/16 2239)  HYDROmorphone (DILAUDID) injection 1 mg (1 mg Intramuscular Given 06/28/16 0716)     Initial Impression / Assessment and Plan / ED Course  I have reviewed the triage vital signs and the nursing notes.  Pertinent labs & imaging results that were available during my care of the patient were  reviewed by me and considered in my medical decision making (see chart for details).  Clinical Course   Patient confused tender to touch everywhere, uncooperative  Patient is disoriented, has hyperesthesia of the skin.  Ambulation attempted patient is very unsteady on her feet resisting efforts to assist complaining of extreme temperature change, stating she's cold. Lab work, x-ray, head CT, urine all within normal parameters.  Initial lactic acid was 1.84.  Repeat lactic acid is 3.3.  Will order additional IV fluids   I personally performed the services described in this documentation, which was scribed in my presence. The recorded information has been reviewed and is accurate.  Final Clinical Impressions(s) / ED Diagnoses   Final diagnoses:  Delirium    New Prescriptions Current Discharge Medication List       Earley Favor, NP 06/25/16 2100    Lorre Nick, MD 06/26/16 0036    Earley Favor, NP 06/28/16 2001    Lorre Nick, MD 07/05/16 2340

## 2016-06-25 NOTE — ED Triage Notes (Signed)
Patient refusing all vital signs. 

## 2016-06-26 ENCOUNTER — Encounter (HOSPITAL_COMMUNITY): Payer: Self-pay | Admitting: Internal Medicine

## 2016-06-26 ENCOUNTER — Emergency Department (HOSPITAL_COMMUNITY): Payer: Medicare Other

## 2016-06-26 DIAGNOSIS — G8929 Other chronic pain: Secondary | ICD-10-CM

## 2016-06-26 DIAGNOSIS — G9340 Encephalopathy, unspecified: Secondary | ICD-10-CM | POA: Diagnosis not present

## 2016-06-26 DIAGNOSIS — Z833 Family history of diabetes mellitus: Secondary | ICD-10-CM | POA: Diagnosis not present

## 2016-06-26 DIAGNOSIS — H409 Unspecified glaucoma: Secondary | ICD-10-CM | POA: Diagnosis present

## 2016-06-26 DIAGNOSIS — F419 Anxiety disorder, unspecified: Secondary | ICD-10-CM | POA: Diagnosis present

## 2016-06-26 DIAGNOSIS — F1096 Alcohol use, unspecified with alcohol-induced persisting amnestic disorder: Secondary | ICD-10-CM | POA: Diagnosis present

## 2016-06-26 DIAGNOSIS — R410 Disorientation, unspecified: Secondary | ICD-10-CM | POA: Diagnosis present

## 2016-06-26 DIAGNOSIS — Z681 Body mass index (BMI) 19 or less, adult: Secondary | ICD-10-CM | POA: Diagnosis not present

## 2016-06-26 DIAGNOSIS — Z79899 Other long term (current) drug therapy: Secondary | ICD-10-CM | POA: Diagnosis not present

## 2016-06-26 DIAGNOSIS — M545 Low back pain: Secondary | ICD-10-CM | POA: Diagnosis present

## 2016-06-26 DIAGNOSIS — R64 Cachexia: Secondary | ICD-10-CM | POA: Diagnosis present

## 2016-06-26 DIAGNOSIS — E872 Acidosis: Secondary | ICD-10-CM | POA: Diagnosis not present

## 2016-06-26 DIAGNOSIS — Z7901 Long term (current) use of anticoagulants: Secondary | ICD-10-CM | POA: Diagnosis not present

## 2016-06-26 DIAGNOSIS — F17210 Nicotine dependence, cigarettes, uncomplicated: Secondary | ICD-10-CM | POA: Diagnosis present

## 2016-06-26 DIAGNOSIS — E876 Hypokalemia: Secondary | ICD-10-CM | POA: Diagnosis present

## 2016-06-26 DIAGNOSIS — D638 Anemia in other chronic diseases classified elsewhere: Secondary | ICD-10-CM | POA: Diagnosis present

## 2016-06-26 DIAGNOSIS — Z86711 Personal history of pulmonary embolism: Secondary | ICD-10-CM | POA: Diagnosis not present

## 2016-06-26 DIAGNOSIS — L89152 Pressure ulcer of sacral region, stage 2: Secondary | ICD-10-CM | POA: Diagnosis present

## 2016-06-26 DIAGNOSIS — E860 Dehydration: Secondary | ICD-10-CM | POA: Diagnosis present

## 2016-06-26 DIAGNOSIS — L8990 Pressure ulcer of unspecified site, unspecified stage: Secondary | ICD-10-CM | POA: Insufficient documentation

## 2016-06-26 DIAGNOSIS — R4182 Altered mental status, unspecified: Secondary | ICD-10-CM | POA: Diagnosis not present

## 2016-06-26 DIAGNOSIS — F609 Personality disorder, unspecified: Secondary | ICD-10-CM | POA: Diagnosis present

## 2016-06-26 DIAGNOSIS — E785 Hyperlipidemia, unspecified: Secondary | ICD-10-CM | POA: Diagnosis present

## 2016-06-26 DIAGNOSIS — E1142 Type 2 diabetes mellitus with diabetic polyneuropathy: Secondary | ICD-10-CM | POA: Diagnosis present

## 2016-06-26 DIAGNOSIS — R451 Restlessness and agitation: Secondary | ICD-10-CM | POA: Diagnosis not present

## 2016-06-26 DIAGNOSIS — F329 Major depressive disorder, single episode, unspecified: Secondary | ICD-10-CM | POA: Diagnosis not present

## 2016-06-26 DIAGNOSIS — E039 Hypothyroidism, unspecified: Secondary | ICD-10-CM

## 2016-06-26 DIAGNOSIS — G92 Toxic encephalopathy: Secondary | ICD-10-CM | POA: Diagnosis present

## 2016-06-26 DIAGNOSIS — R636 Underweight: Secondary | ICD-10-CM | POA: Diagnosis present

## 2016-06-26 DIAGNOSIS — Z9114 Patient's other noncompliance with medication regimen: Secondary | ICD-10-CM | POA: Diagnosis not present

## 2016-06-26 DIAGNOSIS — K219 Gastro-esophageal reflux disease without esophagitis: Secondary | ICD-10-CM | POA: Diagnosis present

## 2016-06-26 DIAGNOSIS — K5090 Crohn's disease, unspecified, without complications: Secondary | ICD-10-CM | POA: Diagnosis present

## 2016-06-26 DIAGNOSIS — I10 Essential (primary) hypertension: Secondary | ICD-10-CM | POA: Diagnosis present

## 2016-06-26 LAB — URINALYSIS, ROUTINE W REFLEX MICROSCOPIC
Glucose, UA: NEGATIVE mg/dL
HGB URINE DIPSTICK: NEGATIVE
Ketones, ur: 15 mg/dL — AB
Leukocytes, UA: NEGATIVE
Nitrite: NEGATIVE
PROTEIN: NEGATIVE mg/dL
Specific Gravity, Urine: 1.017 (ref 1.005–1.030)
pH: 7 (ref 5.0–8.0)

## 2016-06-26 LAB — RAPID URINE DRUG SCREEN, HOSP PERFORMED
Amphetamines: NOT DETECTED
Barbiturates: NOT DETECTED
Benzodiazepines: NOT DETECTED
Cocaine: NOT DETECTED
Opiates: POSITIVE — AB
Tetrahydrocannabinol: NOT DETECTED

## 2016-06-26 LAB — HEPARIN LEVEL (UNFRACTIONATED)
HEPARIN UNFRACTIONATED: 0.64 [IU]/mL (ref 0.30–0.70)
Heparin Unfractionated: 1.06 IU/mL — ABNORMAL HIGH (ref 0.30–0.70)

## 2016-06-26 LAB — SALICYLATE LEVEL

## 2016-06-26 LAB — APTT
APTT: 104 s — AB (ref 24–36)
APTT: 196 s — AB (ref 24–36)

## 2016-06-26 LAB — I-STAT CG4 LACTIC ACID, ED: LACTIC ACID, VENOUS: 3.3 mmol/L — AB (ref 0.5–1.9)

## 2016-06-26 LAB — ACETAMINOPHEN LEVEL

## 2016-06-26 MED ORDER — FOLIC ACID 5 MG/ML IJ SOLN
1.0000 mg | Freq: Every day | INTRAMUSCULAR | Status: DC
  Administered 2016-06-26: 1 mg via INTRAVENOUS
  Filled 2016-06-26 (×5): qty 0.2

## 2016-06-26 MED ORDER — PANTOPRAZOLE SODIUM 40 MG PO TBEC: 40.0000 mg | DELAYED_RELEASE_TABLET | Freq: Every day | ORAL | Status: DC

## 2016-06-26 MED ORDER — HEPARIN (PORCINE) IN NACL 100-0.45 UNIT/ML-% IJ SOLN
700.0000 [IU]/h | INTRAMUSCULAR | Status: DC
  Administered 2016-06-26: 700 [IU]/h via INTRAVENOUS
  Filled 2016-06-26: qty 250

## 2016-06-26 MED ORDER — ORAL CARE MOUTH RINSE
15.0000 mL | Freq: Two times a day (BID) | OROMUCOSAL | Status: DC
  Administered 2016-06-27 – 2016-06-30 (×3): 15 mL via OROMUCOSAL

## 2016-06-26 MED ORDER — LEVOTHYROXINE SODIUM 25 MCG PO TABS: 25.0000 ug | ORAL_TABLET | Freq: Every day | ORAL | Status: DC

## 2016-06-26 MED ORDER — VITAMIN B-1 100 MG PO TABS: 100.0000 mg | ORAL_TABLET | Freq: Every day | ORAL | Status: DC

## 2016-06-26 MED ORDER — KCL IN DEXTROSE-NACL 10-5-0.45 MEQ/L-%-% IV SOLN
INTRAVENOUS | Status: DC
  Administered 2016-06-26 – 2016-06-27 (×3): via INTRAVENOUS
  Filled 2016-06-26 (×3): qty 1000

## 2016-06-26 MED ORDER — SODIUM CHLORIDE 0.9 % IV SOLN
INTRAVENOUS | Status: DC
  Administered 2016-06-26 (×2): via INTRAVENOUS

## 2016-06-26 MED ORDER — THIAMINE HCL 100 MG/ML IJ SOLN
100.0000 mg | Freq: Every day | INTRAMUSCULAR | Status: DC
  Administered 2016-06-26 – 2016-06-28 (×3): 100 mg via INTRAVENOUS
  Filled 2016-06-26 (×3): qty 2

## 2016-06-26 MED ORDER — GABAPENTIN 100 MG PO CAPS: 100.0000 mg | ORAL_CAPSULE | Freq: Two times a day (BID) | ORAL | Status: DC

## 2016-06-26 MED ORDER — HEPARIN (PORCINE) IN NACL 100-0.45 UNIT/ML-% IJ SOLN
550.0000 [IU]/h | INTRAMUSCULAR | Status: DC
  Administered 2016-06-27: 550 [IU]/h via INTRAVENOUS

## 2016-06-26 MED ORDER — SODIUM CHLORIDE 0.9 % IV BOLUS (SEPSIS)
1000.0000 mL | Freq: Once | INTRAVENOUS | Status: AC
  Administered 2016-06-26: 1000 mL via INTRAVENOUS

## 2016-06-26 MED ORDER — FOLIC ACID 1 MG PO TABS: 1.0000 mg | ORAL_TABLET | Freq: Every day | ORAL | Status: DC

## 2016-06-26 MED ORDER — TRAZODONE HCL 50 MG PO TABS
25.0000 mg | ORAL_TABLET | Freq: Every evening | ORAL | Status: DC | PRN
Start: 2016-06-26 — End: 2016-06-26

## 2016-06-26 MED ORDER — HYDROMORPHONE HCL 4 MG PO TABS
8.0000 mg | ORAL_TABLET | ORAL | Status: DC | PRN
  Administered 2016-06-26: 8 mg via ORAL
  Filled 2016-06-26: qty 2

## 2016-06-26 MED ORDER — VENLAFAXINE HCL 25 MG PO TABS
25.0000 mg | ORAL_TABLET | Freq: Two times a day (BID) | ORAL | Status: DC
  Administered 2016-06-28: 25 mg via ORAL
  Filled 2016-06-26 (×4): qty 1

## 2016-06-26 MED ORDER — VENLAFAXINE HCL 25 MG PO TABS
25.0000 mg | ORAL_TABLET | Freq: Two times a day (BID) | ORAL | Status: DC
  Filled 2016-06-26: qty 1

## 2016-06-26 MED ORDER — SODIUM CHLORIDE 0.9 % IV BOLUS (SEPSIS)
2000.0000 mL | Freq: Once | INTRAVENOUS | Status: AC
  Administered 2016-06-26: 2000 mL via INTRAVENOUS

## 2016-06-26 MED ORDER — DOCUSATE SODIUM 100 MG PO CAPS: 200.0000 mg | ORAL_CAPSULE | Freq: Two times a day (BID) | ORAL | Status: DC

## 2016-06-26 MED ORDER — CALCIUM CARBONATE 1250 (500 CA) MG PO TABS: 1.0000 | ORAL_TABLET | Freq: Every day | ORAL | Status: DC

## 2016-06-26 MED ORDER — CHLORHEXIDINE GLUCONATE 0.12 % MT SOLN
15.0000 mL | Freq: Two times a day (BID) | OROMUCOSAL | Status: DC
  Administered 2016-06-27 – 2016-06-28 (×2): 15 mL via OROMUCOSAL
  Filled 2016-06-26 (×6): qty 15

## 2016-06-26 NOTE — Progress Notes (Signed)
Unable to obtain labs on pt d/t her yelling out and moving during attempted sticks. Have had extra staff at bedside to hold pt still, but she moves and tenses so much it makes obtaining labs/blood draws difficult. So far today lab has been unable to collect the following samples: any troponin levels, BMET, CBC with diff/platelet, Ammonia, TSH, Blood Cx x2. Dr. Malachi Bonds made aware of pt's behavior and the inability to obtain labs. Staff will keep assessing pt for changes and will attempt labs if she becomes more cooperative.

## 2016-06-26 NOTE — Progress Notes (Signed)
Initial Nutrition Assessment  DOCUMENTATION CODES:   Underweight  INTERVENTION:   RD to continue to monitor for needs upon improvements in mental status  NUTRITION DIAGNOSIS:   Inadequate oral intake related to lethargy/confusion as evidenced by meal completion < 25%.  GOAL:   Patient will meet greater than or equal to 90% of their needs  MONITOR:   PO intake, Labs, Weight trends, Skin, I & O's  REASON FOR ASSESSMENT:   Other (Comment) (Low BMI)    ASSESSMENT:   55 y.o. female with history of PE and DVT on Apixaban, hypothyroidism, chronic pain and previous history of diabetes mellitus last hemoglobin A1c was 4.9 was brought to the ER after patient was found to be increasingly confused. The duration of confusion is not clear since I'm not able to reach patient's son. On exam in the ER patient complains of pain on touching. Patient also has been repeating the same words. Oriented to name and place and moves all extremities and at times completely confused.  Patient in room with no family at bedside. Per chart review, pt with confusion and is refusing PO intake and medications. Spoke with RN outside of patient's room, RN states pt is not wanting to be touched at this time. Pt refusing PT and SLP evaluation per chart review. RN states pt is not eating at this time. RD will continue to monitor for improvements in pt's mental status and will complete assessment at that time.  Per nutrition assessment on 8/2, pt was found to have severe malnutrition. NFPE results at that time showed: moderate fat and moderate-severe muscle depletion. Will attempt NFPE at follow-up if patient will allow.  Per weight history, pt has lost 20 lb since 6/5 (19% wt loss x 3.5 months, significant for time frame).   Labs reviewed. Medications: IV Folic acid daily, IV Thiamine daily  Diet Order:     Skin:  Wound (see comment) (Stage II sacral pressure injury)  Last BM:  9/26  Height:   Ht Readings from  Last 1 Encounters:  06/26/16 5\' 6"  (1.676 m)    Weight:   Wt Readings from Last 1 Encounters:  06/26/16 88 lb 6.5 oz (40.1 kg)    Ideal Body Weight:  59 kg  BMI:  Body mass index is 14.27 kg/m.  Estimated Nutritional Needs:   Kcal:  1400-1600  Protein:  60-75g  Fluid:  1.4-1.6L/day  EDUCATION NEEDS:   No education needs identified at this time  Tilda Franco, MS, RD, LDN Pager: [phone] After Hours Pager: [phone]

## 2016-06-26 NOTE — ED Notes (Signed)
Pt attempted to be ambulated but was unsteady with assistance. Pt kept repeating "I'm cold". Pt awake and [facility] questioned as to place, with questioning of date pt stated year was 2017 but could not state month. Pt kept repeating 2017 when asked month. Dr.Knapp at bedside to assess pt.

## 2016-06-26 NOTE — Progress Notes (Signed)
ANTICOAGULATION CONSULT NOTE - Initial Consult  Pharmacy Consult for Heparin Indication: H/O DVT/PE (on Eliquis PTA)  Allergies  Allergen Reactions  . Iohexol Other (See Comments)    "severe burning" Patient has received Contrast in 2005 with 13 hour pre-medication, and had no reaction at that time  . Spiriva Handihaler [Tiotropium Bromide Monohydrate] Nausea And Vomiting  . Iodinated Diagnostic Agents Other (See Comments)  . Ativan [Lorazepam] Other (See Comments)    hallucinations  . Penicillins Hives    Has had cephalosporins    Patient Measurements: Height: 5\' 5"  (165.1 cm) Weight: 100 lb (45.4 kg) IBW/kg (Calculated) : 57  Vital Signs: Temp: 99.7 F (37.6 C) (09/26 0507) Temp Source: Rectal (09/26 0507) BP: 114/77 (09/26 0507) Pulse Rate: 87 (09/26 0507)  Labs:  Recent Labs  06/25/16 2124  HGB 11.1*  HCT 31.8*  PLT 161  CREATININE 0.61    Estimated Creatinine Clearance: 55.6 mL/min (by C-G formula based on SCr of 0.61 mg/dL).   Medical History: Past Medical History:  Diagnosis Date  . Anxiety   . Arthritis    "a little bit; all over" (05/01/2016)  . Asthma   . Asthma   . C. difficile colitis   . Chronic lower back pain   . Crohn disease (HCC)   . Diabetes mellitus without complication (HCC)   . DVT (deep venous thrombosis) (HCC) ~ 03/2016   LLE  . Elevated lactic acid level   . GERD (gastroesophageal reflux disease)   . Glaucoma   . HLD (hyperlipidemia)   . Hypertension   . Hypothyroidism   . Neuropathy (HCC)   . Pancreatitis   . Pulmonary embolus (HCC) 05/24/2014  . Thrombocytopenia (HCC)   . Tobacco abuse     Assessment:  55 yr female with PMH significant DM, DVT/PE who is on Eliquis 5mg  po BID PTA.  Presents with weakness, confusion and body aches.  Pt to be admitted for acute encephalopathy.  Pharmacy consulted to dose IV heparin  Patient's last reported dose of Eliquis 5mg  = 9/25 @ 10:00  Goal of Therapy:  Heparin level 0.3-0.7  units/ml aPTT 66-102 seconds Monitor platelets by anticoagulation protocol: Yes   Plan:   Obtain baseline aPTT and heparin level  Begin IV heparin @ 700 units/hr  F/U baseline labs.  If baseline HL < 0.1 and correlates with a aPTT < 47 sec, will monitor heparin with HL only. If HL elevated and does not correlate with aPTT will titrate heparin according to aPTT until effects of Eliquis on HL have disappeared and can then monitor with heparin level  Check aPTT and HL 6 hr after heparin started  Follow CBC daily  Elzie Sheets, Joselyn Glassman, PharmD 06/26/2016,6:15 AM

## 2016-06-26 NOTE — ED Provider Notes (Signed)
Patient presents emergency department via EMS. She appears to be confused. She is calling out stating she is cold. She does know the year but when I ask her what month it is she keeps repeating [phone]. She cannot tell me what's going on with her tonight.  Patient is very thin and underweight, she is very pale. She appears to be confused and is not acting normally.  Medical screening examination/treatment/procedure(s) were conducted as a shared visit with non-physician practitioner(s) and myself.  I personally evaluated the patient during the encounter.   EKG Interpretation None      Devoria Albe, MD, Concha Pyo, MD 06/26/16 [phone]

## 2016-06-26 NOTE — Care Management Obs Status (Signed)
MEDICARE OBSERVATION STATUS NOTIFICATION   Patient Details  Name: [name] MRN: [id number] Date of Birth: 01-25-1961   Medicare Observation Status Notification Given:  Other (see comment) Pt is very confused.    Geni Bers, RN 06/26/2016, 3:50 PM

## 2016-06-26 NOTE — Progress Notes (Signed)
ANTICOAGULATION CONSULT NOTE - Follow Up Consult  Pharmacy Consult for Heparin Indication: Hx DVT/PE, PTA Eliquis held  Allergies  Allergen Reactions  . Iohexol Other (See Comments)    "severe burning" Patient has received Contrast in 2005 with 13 hour pre-medication, and had no reaction at that time  . Spiriva Handihaler [Tiotropium Bromide Monohydrate] Nausea And Vomiting  . Iodinated Diagnostic Agents Other (See Comments)  . Ativan [Lorazepam] Other (See Comments)    hallucinations  . Penicillins Hives    Has had cephalosporins    Patient Measurements: Height: 5\' 6"  (167.6 cm) Weight: 88 lb 6.5 oz (40.1 kg) IBW/kg (Calculated) : 59.3   Vital Signs: Temp: 99.4 F (37.4 C) (09/26 0620) Temp Source: Oral (09/26 0620) BP: 146/108 (09/26 0620) Pulse Rate: 67 (09/26 0620)  Labs:  Recent Labs  06/25/16 2124  HGB 11.1*  HCT 31.8*  PLT 161  CREATININE 0.61    Estimated Creatinine Clearance: 49.1 mL/min (by C-G formula based on SCr of 0.61 mg/dL).   Medications:  Scheduled:  . calcium carbonate  1 tablet Oral Q breakfast  . docusate sodium  200 mg Oral BID  . folic acid  1 mg Oral Daily  . gabapentin  100 mg Oral BID  . levothyroxine  25 mcg Oral QAC breakfast  . pantoprazole  40 mg Oral Daily  . thiamine  100 mg Oral Daily   Infusions:  . sodium chloride 100 mL/hr at 06/26/16 0646  . heparin 700 Units/hr (06/26/16 0646)    Assessment: 55 yr female with PMH significant DM, DVT/PE who is on Eliquis 5mg  po BID PTA.  Presents with weakness, confusion and body aches.  Pt to be admitted for acute encephalopathy.  Pharmacy consulted to dose IV heparin while Eliquis is held.  Patient's last reported dose of Eliquis 5mg  = 9/25 @ 10:00  Today, 06/26/2016: Heparin level 0.64, therapeutic APTT 104, therapeutic CBC: Hgb 11.1, Plt 161 No bleeding or IV complications reported.   Goal of Therapy:  Heparin level 0.3-0.7 units/ml aPTT 66-102 seconds Monitor platelets  by anticoagulation protocol: Yes   Plan:   Continue heparin IV infusion at 700 units/hr  Heparin level and APTT in 6 hours to confirm therapeutic level.  Discontinue APTT monitoring if it continues to correspond with therapeutic heparin levels.  Daily heparin level and CBC.  MD to resume Eliquis when pt is more alert and able to tolerate PO medications.   Lynann Beaver PharmD, BCPS Pager [phone] 06/26/2016 10:05 AM

## 2016-06-26 NOTE — H&P (Signed)
History and Physical    [name] LDJ:[id number] DOB: 05/31/1961 DOA: 06/25/2016  PCP: Georgann Housekeeper, MD  Patient coming from: Home.  History obtained from ER physician and patient's nose. Patient appears confused. Unable to reach patient's son with whom patient lives.  Chief Complaint: Confusion.  HPI: [name] is a 55 y.o. female with history of PE and DVT on Apixaban, hypothyroidism, chronic pain and previous history of diabetes mellitus last hemoglobin A1c was 4.9 was brought to the ER after patient was found to be increasingly confused. The duration of confusion is not clear since I'm not able to reach patient's son. On exam in the ER patient complains of pain on touching. Patient also has been repeating the same words. Oriented to name and place and moves all extremities and at times completely confused. CT of the head is unremarkable. Chest x-ray and UA does not show any infective source. Patient's lactate level initially was normal but repeat shows elevation. Patient is being admitted for acute encephalopathy of unknown cause.    ED Course: CT head was unremarkable. Chest x-ray and UA does not show any infective source.  Review of Systems: As per HPI, rest all negative.   Past Medical History:  Diagnosis Date  . Anxiety   . Arthritis    "a little bit; all over" (05/01/2016)  . Asthma   . Asthma   . C. difficile colitis   . Chronic lower back pain   . Crohn disease (HCC)   . Diabetes mellitus without complication (HCC)   . DVT (deep venous thrombosis) (HCC) ~ 03/2016   LLE  . Elevated lactic acid level   . GERD (gastroesophageal reflux disease)   . Glaucoma   . HLD (hyperlipidemia)   . Hypertension   . Hypothyroidism   . Neuropathy (HCC)   . Pancreatitis   . Pulmonary embolus (HCC) 05/24/2014  . Thrombocytopenia (HCC)   . Tobacco abuse     Past Surgical History:  Procedure Laterality Date  . CESAREAN SECTION  1996  . COLONOSCOPY N/A 03/02/2015   Procedure:  COLONOSCOPY;  Surgeon: Bernette Redbird, MD;  Location: [facility] ENDOSCOPY;  Service: Endoscopy;  Laterality: N/A;  . ESOPHAGOGASTRODUODENOSCOPY N/A 05/21/2014   Procedure: ESOPHAGOGASTRODUODENOSCOPY (EGD);  Surgeon: Petra Kuba, MD;  Location: [facility] ENDOSCOPY;  Service: Endoscopy;  Laterality: N/A;  . ESOPHAGOGASTRODUODENOSCOPY N/A 03/02/2015   Procedure: ESOPHAGOGASTRODUODENOSCOPY (EGD);  Surgeon: Bernette Redbird, MD;  Location: [facility] ENDOSCOPY;  Service: Endoscopy;  Laterality: N/A;     reports that she has been smoking Cigarettes.  She has a 9.25 pack-year smoking history. She has never used smokeless tobacco. She reports that she does not drink alcohol or use drugs.  Allergies  Allergen Reactions  . Iohexol Other (See Comments)    "severe burning" Patient has received Contrast in 2005 with 13 hour pre-medication, and had no reaction at that time  . Spiriva Handihaler [Tiotropium Bromide Monohydrate] Nausea And Vomiting  . Iodinated Diagnostic Agents Other (See Comments)  . Ativan [Lorazepam] Other (See Comments)    hallucinations  . Penicillins Hives    Has had cephalosporins    Family History  Problem Relation Age of Onset  . Breast cancer Mother   . Heart disease Mother 31    CABG  . Diabetes Mother   . Heart disease Father 83    CABG  . Diabetes Father     Prior to Admission medications   Medication Sig Start Date End Date Taking? Authorizing Provider  apixaban (ELIQUIS) 5 MG TABS tablet Take 1 tablet (5 mg total) by mouth 2 (two) times daily. 05/02/16  Yes Richarda Overlie, MD  calcium carbonate (OS-CAL - DOSED IN MG OF ELEMENTAL CALCIUM) 1250 (500 CA) MG tablet Take 1 tablet (500 mg of elemental calcium total) by mouth daily with breakfast. 03/04/15   Alison Murray, MD  docusate sodium (COLACE) 100 MG capsule Take 2 capsules (200 mg total) by mouth 2 (two) times daily. 02/07/16   Leroy Sea, MD  feeding supplement, RESOURCE BREEZE, (RESOURCE BREEZE) LIQD Take 1 Container by mouth 3 (three)  times daily between meals. Patient not taking: Reported on 06/25/2016 08/02/14   Kathlen Mody, MD  folic acid (FOLVITE) 1 MG tablet Take 1 tablet (1 mg total) by mouth daily. 03/06/16   Richarda Overlie, MD  gabapentin (NEURONTIN) 100 MG capsule Take 1 capsule (100 mg total) by mouth 2 (two) times daily. 03/06/16   Richarda Overlie, MD  HYDROcodone-acetaminophen (NORCO) 10-325 MG tablet TAKE 1 TAB BY MOUTH 3 TIMES DAILY AS NEEDED FOR PAIN 05/16/16   Historical Provider, MD  HYDROcodone-acetaminophen (NORCO/VICODIN) 5-325 MG tablet Take 1 tablet by mouth every 6 (six) hours as needed. Patient not taking: Reported on 06/25/2016 01/09/16   Derwood Kaplan, MD  HYDROmorphone (DILAUDID) 4 MG tablet Take 8 mg by mouth every 4 (four) hours as needed for severe pain.    Historical Provider, MD  levothyroxine (SYNTHROID, LEVOTHROID) 25 MCG tablet Take 1 tablet (25 mcg total) by mouth daily before breakfast. 03/06/16   Richarda Overlie, MD  ondansetron (ZOFRAN ODT) 4 MG disintegrating tablet Take 1 tablet (4 mg total) by mouth every 8 (eight) hours as needed for nausea or vomiting. 05/15/16   Gerhard Munch, MD  pantoprazole (PROTONIX) 20 MG tablet Take 2 tablets (40 mg total) by mouth daily. 03/06/16   Richarda Overlie, MD  polyethylene glycol (MIRALAX / GLYCOLAX) packet Take 17 g by mouth daily. Patient not taking: Reported on 06/25/2016 02/07/16   Leroy Sea, MD  potassium chloride SA (K-DUR,KLOR-CON) 20 MEQ tablet Take 2 tablets (40 mEq total) by mouth daily. 05/02/16 05/22/16  Richarda Overlie, MD  saccharomyces boulardii (FLORASTOR) 250 MG capsule Take 1 capsule (250 mg total) by mouth 2 (two) times daily. Patient not taking: Reported on 06/25/2016 04/05/15   Vassie Loll, MD  thiamine (VITAMIN B-1) 100 MG tablet Take 1 tablet (100 mg total) by mouth daily. 03/06/16   Richarda Overlie, MD  traZODone (DESYREL) 25 mg TABS tablet Take 0.5 tablets (25 mg total) by mouth at bedtime as needed for sleep. 08/02/14   Kathlen Mody, MD  venlafaxine  (EFFEXOR) 25 MG tablet Take 1 tablet (25 mg total) by mouth 2 (two) times daily. 03/04/15   Alison Murray, MD    Physical Exam: Vitals:   06/26/16 0136 06/26/16 0200 06/26/16 0336 06/26/16 0507  BP: 127/89 125/83 112/85 114/77  Pulse: 77 80 82 87  Resp: 14 21 14 23   Temp:    99.7 F (37.6 C)  TempSrc:    Rectal  SpO2: 98% 100% 100% 99%  Weight:      Height:          Constitutional: Not in distress. Vitals:   06/26/16 0136 06/26/16 0200 06/26/16 0336 06/26/16 0507  BP: 127/89 125/83 112/85 114/77  Pulse: 77 80 82 87  Resp: 14 21 14 23   Temp:    99.7 F (37.6 C)  TempSrc:    Rectal  SpO2: 98% 100%  100% 99%  Weight:      Height:       Eyes: Anicteric no pallor. ENMT: No discharge from the ears eyes nose or mouth. Neck: No mass felt. No neck rigidity. Respiratory: No rhonchi or crepitations. Cardiovascular: S1 and S2 heard. Abdomen: Soft nontender bowel sounds present. Musculoskeletal: No edema. Skin: No rash. Neurologic: Alert awake oriented to her name. Appears confused and restless. Moves all extremities. Psychiatric: Appears confused and restless.   Labs on Admission: I have personally reviewed following labs and imaging studies  CBC:  Recent Labs Lab 06/25/16 2124  WBC 7.4  NEUTROABS 4.4  HGB 11.1*  HCT 31.8*  MCV 97.2  PLT 161   Basic Metabolic Panel:  Recent Labs Lab 06/25/16 2124  NA 143  K 3.8  CL 109  CO2 25  GLUCOSE 75  BUN <5*  CREATININE 0.61  CALCIUM 7.2*   GFR: Estimated Creatinine Clearance: 55.6 mL/min (by C-G formula based on SCr of 0.61 mg/dL). Liver Function Tests:  Recent Labs Lab 06/25/16 2124  AST 39  ALT 10*  ALKPHOS 106  BILITOT 1.3*  PROT 4.8*  ALBUMIN 2.0*    Recent Labs Lab 06/25/16 2124  LIPASE 12   No results for input(s): AMMONIA in the last 168 hours. Coagulation Profile: No results for input(s): INR, PROTIME in the last 168 hours. Cardiac Enzymes: No results for input(s): CKTOTAL, CKMB,  CKMBINDEX, TROPONINI in the last 168 hours. BNP (last 3 results) No results for input(s): PROBNP in the last 8760 hours. HbA1C: No results for input(s): HGBA1C in the last 72 hours. CBG:  Recent Labs Lab 06/25/16 2107  GLUCAP 70   Lipid Profile: No results for input(s): CHOL, HDL, LDLCALC, TRIG, CHOLHDL, LDLDIRECT in the last 72 hours. Thyroid Function Tests: No results for input(s): TSH, T4TOTAL, FREET4, T3FREE, THYROIDAB in the last 72 hours. Anemia Panel: No results for input(s): VITAMINB12, FOLATE, FERRITIN, TIBC, IRON, RETICCTPCT in the last 72 hours. Urine analysis:    Component Value Date/Time   COLORURINE AMBER (A) 06/26/2016 0000   APPEARANCEUR CLEAR 06/26/2016 0000   LABSPEC 1.017 06/26/2016 0000   PHURINE 7.0 06/26/2016 0000   GLUCOSEU NEGATIVE 06/26/2016 0000   HGBUR NEGATIVE 06/26/2016 0000   BILIRUBINUR SMALL (A) 06/26/2016 0000   KETONESUR 15 (A) 06/26/2016 0000   PROTEINUR NEGATIVE 06/26/2016 0000   UROBILINOGEN 1.0 02/28/2015 1227   NITRITE NEGATIVE 06/26/2016 0000   LEUKOCYTESUR NEGATIVE 06/26/2016 0000   Sepsis Labs: @LABRCNTIP (procalcitonin:4,lacticidven:4) )No results found for this or any previous visit (from the past 240 hour(s)).   Radiological Exams on Admission: Dg Chest 2 View  Result Date: 06/25/2016 CLINICAL DATA:  Generalized weakness and altered mental status. Initial encounter. EXAM: CHEST  2 VIEW COMPARISON:  May 01, 2016 FINDINGS: The heart size and mediastinal contours are stable. The aorta is tortuous. There is no focal infiltrate, pulmonary edema, or pleural effusion. The visualized skeletal structures are unremarkable. IMPRESSION: No active cardiopulmonary disease. Electronically Signed   By: Sherian Rein M.D.   On: 06/25/2016 21:22   Ct Head Wo Contrast  Result Date: 06/26/2016 CLINICAL DATA:  55 year old female with increased confusion and recent fall. Altered mental status. EXAM: CT HEAD WITHOUT CONTRAST TECHNIQUE: Contiguous  axial images were obtained from the base of the skull through the vertex without intravenous contrast. COMPARISON:  Head CT dated 03/31/2015 FINDINGS: Brain: There is mild age-related atrophy and chronic microvascular ischemic changes. There has been interval resolution of the previously seen diffuse white matter  hypodensities. There is no acute intracranial hemorrhage. No mass effect or midline shift noted. No extra-axial fluid collection. Vascular: No hyperdense vessel or unexpected calcification. Skull: Normal. Negative for fracture or focal lesion. Sinuses/Orbits: No acute finding. Other: None IMPRESSION: No acute intracranial hemorrhage. Mild age-related atrophy and chronic microvascular ischemic disease. If symptoms persist and there are no contraindications, MRI may provide better evaluation if clinically indicated Electronically Signed   By: Elgie CollardArash  Radparvar M.D.   On: 06/26/2016 03:17     Assessment/Plan Principal Problem:   Acute encephalopathy Active Problems:   Peripheral neuropathy (HCC)   Normocytic anemia   History of pulmonary embolus (PE)   Elevated lactic acid level   Chronic pain   Hypothyroidism    1. Acute encephalopathy - cause not clear. Since patient's lactate levels is increasing I have ordered blood cultures. Check ammonia levels EEG MRI brain TSH troponin. I have ordered 1 L normal saline bolus and continuous infusion. Follow lactate levels closely. Patient is on large doses of pain medications including Dilaudid and gabapentin. I'm not sure patient was taking them as used to be and if patient is withdrawing from them. Urine drug screen is positive for opiates. I will hold off Effexor for now not sure if patient is having or developing serotonin syndrome. Closely observe in telemetry for now. I have tried to reach patient's son was unable to. Retry again in the morning. 2. History of PE and DVT - until patient's swallow can be reliable I have placed patient on heparin  pharmacy to dose. 3. Hypothyroidism on Synthroid. 4. Anemia - follow CBC.   DVT prophylaxis: Heparin infusion. Code Status: Full code.  Family Communication: Unable to reach patient's son.  Disposition Plan: To be determined.  Consults called: Speech therapist and PT consult.  Admission status: Observation.    Eduard ClosKAKRAKANDY,ARSHAD N. MD [facility] Pager [phone].  If 7PM-7AM, please contact night-coverage [email or web address] Password [email or web address]  06/26/2016, 6:09 AM

## 2016-06-26 NOTE — ED Notes (Signed)
Lactic Acid given to MD. 

## 2016-06-26 NOTE — Progress Notes (Signed)
PHARMACY - HEPARIN (brief note)  Patient on IV heparin while Eliquis is on hold for h/o DVT/PE  IV heparin infusing @ 700 units/hr  APTT = 196sec and Heparin level = 1.06 (aPTT earlier on same rate slightly elevaetd).  Spoke with RN who confirmed labs were drawn from opposite arm from where heparin was infusing.  RN confirmed no complications of therapy.  Plan: Hold heparin infusion x 1 hr then restart heparin @ 550 units/hr Recheck aPTT and heparin level 8 hr after heparin restarted  Terrilee Files, PharmD

## 2016-06-26 NOTE — Evaluation (Addendum)
SLP Cancellation Note  Patient Details Name: [name] MRN: [id number] DOB: [birth year]   Cancelled treatment:       Reason Eval/Treat Not Completed: Other (comment) (pt repeated "no baby" refusing intake even after multiple options and clinical reasoning provided, requested rn to page if pt becomes participative; of note, pt's voice was clear and strong)   Donavan Burnet, MS [facility] SLP [phone]

## 2016-06-26 NOTE — Progress Notes (Addendum)
PROGRESS NOTE  [name]  KDX:[id number] DOB: 1960-11-30 DOA: 06/25/2016 PCP: Georgann Housekeeper, MD  Brief Narrative:   [name] is a 55 y.o. 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  Notes from previous admissions document that she has tried to manipulate staff by refusing discharge to home and demanding SNF, then ultimately refusing SNF.  She has a history of refusing procedures and labs.  She is on multiple sedating medications prescribed by multiple doctors.  She was brought to the ER with confusion.  In the ER she complained of pain on touching and was intermittently oriented to name and place but not at other times.  CT of the head is unremarkable. Chest x-ray and UA were unremarkable. Patient's lactate level initially was normal but repeat was elevated. Patient was admitted for acute encephalopathy.    When I visited her this morning, she was awake, alert and talking in a clear voice but was repeating "SLIGHTLY!" over and over again.  When I tried to pull her blanket down slightly to auscultate her heart she said "COLD!" and pulled her blanket back, but was otherwise not conversant.  She refused labs.  I have resumed her Effexor but have held her other medications presuming she may have accidentally overdosed causing sedation.  I also called psychiatry to assist.  Anticipate she will be a difficult work up and discharge.  Consider palliative care consultation for GOC conversation with family.    Assessment & Plan:   Principal Problem:   Acute encephalopathy Active Problems:   Peripheral neuropathy (HCC)   Normocytic anemia   History of pulmonary embolus  (PE)   Elevated lactic acid level   Chronic pain   Hypothyroidism   Pressure injury of skin   Acute encephalopathy, likely medication related.  I suspect she has some delirium superimposed on underlying Korsakoff dementia or possibly a personality disorder.   -  Hold all medications except for Effexor for avoid withdrawal symptoms -  Psychiatry consultation:  Please continue to follow for capacity to determine disposition.  Thank you! -  PT/OT/SLP were unable to work with patient today due to her refusal  Dehydration with mildly elevated lactic acid.  No evidence of underlying infection -  F/u cultures -  IVF -  Attempt to repeat lactic acid  PE and LLE DVT which required catheter lysis 12/2015  -  Continue heparin gtt for now but if problematic to get labs, can change to lovenox   Colitis with hx of admissions for both diarrhea and constipation per d/c summaries -  Refuses abdominal exam -  KUB to assess for stool  Hypothyroidism -  Check TSH -  Hold synthroid while not tolerating PO  Anemia of chronic disease, hemoglobin near baseline  Underweight -  Regular diet with supplements -  Supplements per Nutrition, appreciate assistance  Stage 2 pressure ulcer -  Pressure dressing  DVT prophylaxis:  heparin Code Status:  full Family Communication:  Unable to reach son.  Mobile number listed was inaccurate.  No answer at home number. Disposition Plan:  Pending improvement in mentation to HOME since patient has repeatedly refused discharge to SNF   Consultants:   psychiatry  Procedures:  none  Antimicrobials:  none    Subjective:  Refuses or is unable to answer direct ROS questions.  Repeats "SLIGHTLY" over and over again.  Objective: Vitals:   06/26/16 0507 06/26/16 0615 06/26/16 0620 06/26/16 1453  BP: 114/77  (!) 146/108 105/65  Pulse: 87  67 85  Resp: 23  (!) 22 18  Temp: 99.7 F (37.6 C)  99.4 F (37.4 C) 98.3 F (36.8 C)  TempSrc: Rectal  Oral  Axillary  SpO2: 99%   100%  Weight:  40.1 kg (88 lb 6.5 oz)    Height:  5\' 6"  (1.676 m)      Intake/Output Summary (Last 24 hours) at 06/26/16 1710 Last data filed at 06/26/16 1509  Gross per 24 hour  Intake          1897.01 ml  Output                0 ml  Net          1897.01 ml   Filed Weights   06/25/16 1826 06/26/16 0615  Weight: 45.4 kg (100 lb) 40.1 kg (88 lb 6.5 oz)    Examination:  General exam:  Cachectic adult female.  Lying on side in bed with head covered.  No acute distress.  HEENT:  NCAT, MMM Respiratory system: Clear to auscultation bilaterally Cardiovascular system: Regular rate and rhythm, normal S1/S2. No murmurs, rubs, gallops or clicks.  Warm extremities Gastrointestinal system:  Refused abdominal exam MSK:  Normal tone and bulk, no lower extremity edema Neuro:  Grossly intact    Data Reviewed: I have personally reviewed following labs and imaging studies  CBC:  Recent Labs Lab 06/25/16 2124  WBC 7.4  NEUTROABS 4.4  HGB 11.1*  HCT 31.8*  MCV 97.2  PLT 161   Basic Metabolic Panel:  Recent Labs Lab 06/25/16 2124  NA 143  K 3.8  CL 109  CO2 25  GLUCOSE 75  BUN <5*  CREATININE 0.61  CALCIUM 7.2*   GFR: Estimated Creatinine Clearance: 49.1 mL/min (by C-G formula based on SCr of 0.61 mg/dL). Liver Function Tests:  Recent Labs Lab 06/25/16 2124  AST 39  ALT 10*  ALKPHOS 106  BILITOT 1.3*  PROT 4.8*  ALBUMIN 2.0*    Recent Labs Lab 06/25/16 2124  LIPASE 12   No results for input(s): AMMONIA in the last 168 hours. Coagulation Profile: No results for input(s): INR, PROTIME in the last 168 hours. Cardiac Enzymes: No results for input(s): CKTOTAL, CKMB, CKMBINDEX, TROPONINI in the last 168 hours. BNP (last 3 results) No results for input(s): PROBNP in the last 8760 hours. HbA1C: No results for input(s): HGBA1C in the last 72 hours. CBG:  Recent Labs Lab 06/25/16 2107  GLUCAP 70   Lipid Profile: No results for  input(s): CHOL, HDL, LDLCALC, TRIG, CHOLHDL, LDLDIRECT in the last 72 hours. Thyroid Function Tests: No results for input(s): TSH, T4TOTAL, FREET4, T3FREE, THYROIDAB in the last 72 hours. Anemia Panel: No results for input(s): VITAMINB12, FOLATE, FERRITIN, TIBC, IRON, RETICCTPCT in the last 72 hours. Urine analysis:    Component Value Date/Time   COLORURINE AMBER (A) 06/26/2016 0000   APPEARANCEUR CLEAR 06/26/2016 0000   LABSPEC 1.017 06/26/2016 0000   PHURINE 7.0 06/26/2016 0000   GLUCOSEU NEGATIVE 06/26/2016 0000   HGBUR NEGATIVE 06/26/2016 0000   BILIRUBINUR SMALL (A) 06/26/2016 0000   KETONESUR 15 (A) 06/26/2016 0000   PROTEINUR NEGATIVE 06/26/2016 0000   UROBILINOGEN 1.0 02/28/2015 1227   NITRITE NEGATIVE 06/26/2016 0000  LEUKOCYTESUR NEGATIVE 06/26/2016 0000   Sepsis Labs: @LABRCNTIP (procalcitonin:4,lacticidven:4)  )No results found for this or any previous visit (from the past 240 hour(s)).    Radiology Studies: Dg Chest 2 View  Result Date: 06/25/2016 CLINICAL DATA:  Generalized weakness and altered mental status. Initial encounter. EXAM: CHEST  2 VIEW COMPARISON:  May 01, 2016 FINDINGS: The heart size and mediastinal contours are stable. The aorta is tortuous. There is no focal infiltrate, pulmonary edema, or pleural effusion. The visualized skeletal structures are unremarkable. IMPRESSION: No active cardiopulmonary disease. Electronically Signed   By: Sherian Rein M.D.   On: 06/25/2016 21:22   Ct Head Wo Contrast  Result Date: 06/26/2016 CLINICAL DATA:  55 year old female with increased confusion and recent fall. Altered mental status. EXAM: CT HEAD WITHOUT CONTRAST TECHNIQUE: Contiguous axial images were obtained from the base of the skull through the vertex without intravenous contrast. COMPARISON:  Head CT dated 03/31/2015 FINDINGS: Brain: There is mild age-related atrophy and chronic microvascular ischemic changes. There has been interval resolution of the  previously seen diffuse white matter hypodensities. There is no acute intracranial hemorrhage. No mass effect or midline shift noted. No extra-axial fluid collection. Vascular: No hyperdense vessel or unexpected calcification. Skull: Normal. Negative for fracture or focal lesion. Sinuses/Orbits: No acute finding. Other: None IMPRESSION: No acute intracranial hemorrhage. Mild age-related atrophy and chronic microvascular ischemic disease. If symptoms persist and there are no contraindications, MRI may provide better evaluation if clinically indicated Electronically Signed   By: Elgie Collard M.D.   On: 06/26/2016 03:17     Scheduled Meds: . chlorhexidine  15 mL Mouth Rinse BID  . folic acid  1 mg Intravenous Daily  . mouth rinse  15 mL Mouth Rinse q12n4p  . thiamine injection  100 mg Intravenous Daily  . venlafaxine  25 mg Oral BID WC   Continuous Infusions: . sodium chloride 100 mL/hr at 06/26/16 1509  . heparin 700 Units/hr (06/26/16 0646)     LOS: 0 days    Time spent: 30 min    Renae Fickle, MD [facility] Pager [phone]  If 7PM-7AM, please contact night-coverage [email or web address] Password [email or web address] 06/26/2016, 5:10 PM

## 2016-06-26 NOTE — Evaluation (Signed)
SLP Cancellation Note  Patient Details Name: [name] MRN: [id number] DOB: [birth year]   Cancelled treatment:       Reason Eval/Treat Not Completed: Other (comment) (spoke to rn who reports pt continues with agitation, not ready for eval/po)   Donavan Burnet, MS [facility] SLP [phone]

## 2016-06-26 NOTE — Progress Notes (Signed)
PT Cancellation Note  Patient Details Name: [name] MRN: [id number] DOB: 05-09-1961   Cancelled Treatment:    Reason Eval/Treat Not Completed: Medical issues which prohibited therapy Discussed with RN, pt not ready to participate in PT today.  Will check back tomorrow.   Gifford Ballon,KATHrine E 06/26/2016, 2:16 PM Zenovia Jarred, PT, DPT 06/26/2016 Pager: [phone]

## 2016-06-27 DIAGNOSIS — R410 Disorientation, unspecified: Secondary | ICD-10-CM

## 2016-06-27 LAB — C DIFFICILE QUICK SCREEN W PCR REFLEX
C DIFFICILE (CDIFF) INTERP: NOT DETECTED
C DIFFICILE (CDIFF) TOXIN: NEGATIVE
C DIFFICLE (CDIFF) ANTIGEN: NEGATIVE

## 2016-06-27 MED ORDER — HALOPERIDOL LACTATE 5 MG/ML IJ SOLN
2.5000 mg | Freq: Once | INTRAMUSCULAR | Status: AC
  Administered 2016-06-27: 2.5 mg via INTRAVENOUS
  Filled 2016-06-27: qty 1

## 2016-06-27 MED ORDER — HYDROMORPHONE HCL 1 MG/ML IJ SOLN
1.0000 mg | INTRAMUSCULAR | Status: DC | PRN
Start: 2016-06-27 — End: 2016-06-28
  Administered 2016-06-27: 1 mg via INTRAVENOUS
  Filled 2016-06-27: qty 1

## 2016-06-27 MED ORDER — HALOPERIDOL LACTATE 5 MG/ML IJ SOLN
5.0000 mg | Freq: Once | INTRAMUSCULAR | Status: AC
  Administered 2016-06-27: 5 mg via INTRAMUSCULAR
  Filled 2016-06-27: qty 1

## 2016-06-27 MED ORDER — DIPHENOXYLATE-ATROPINE 2.5-0.025 MG PO TABS
2.0000 | ORAL_TABLET | Freq: Once | ORAL | Status: AC
  Administered 2016-06-27: 2 via ORAL
  Filled 2016-06-27: qty 2

## 2016-06-27 MED ORDER — APIXABAN 5 MG PO TABS
5.0000 mg | ORAL_TABLET | Freq: Two times a day (BID) | ORAL | Status: DC
  Administered 2016-06-27 – 2016-06-30 (×7): 5 mg via ORAL
  Filled 2016-06-27 (×9): qty 1

## 2016-06-27 MED ORDER — HALOPERIDOL LACTATE 5 MG/ML IJ SOLN: 5.0000 mg | Freq: Once | INTRAMUSCULAR | Status: DC

## 2016-06-27 NOTE — Progress Notes (Signed)
PROGRESS NOTE  [name]  TUU:[id number] DOB: [birth year] DOA: 06/25/2016  PCP: Georgann Housekeeper, MD  Brief Narrative:  55 y.o. 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  Notes from previous admissions document that she has tried to manipulate staff by refusing discharge to home and demanding SNF, then ultimately refusing SNF.  She has a history of refusing procedures and labs.  She is on multiple sedating medications prescribed by multiple doctors.  She was brought to the ER with confusion.  In the ER she complained of pain on touching and was intermittently oriented to name and place but not at other times.  CT of the head is unremarkable. Chest x-ray and UA were unremarkable. Patient's lactate level initially was normal but repeat was elevated. Patient was admitted for acute encephalopathy.    Assessment & Plan: Acute encephalopathy, likely medication related - suspect that some delirium superimposed on underlying Korsakoff dementia or possibly a personality disorder.   - continue to hold all medications except for Effexor for avoid withdrawal symptoms - psych following, appreciate assistance, will continue to follow up on recommendations  - PT/OT/SLP were unable to work with patient today due to her refusal  Dehydration  - with mildly elevated lactic acid.  No evidence of underlying infection - F/u cultures - will try to repeat lactic acid in Am if pt ok and does not refuse   PE and LLE DVT  - which required catheter lysis 12/2015  - change to home Eliquis as pt refusing IV lines  Colitis  - with hx of admissions for both diarrhea and constipation per d/c  summaries - Refuses abdominal exam  Hypothyroidism - resume synthroid   Anemia of chronic disease - hemoglobin near baseline  Underweight - Regular diet with supplements - Supplements per Nutrition, appreciate assistance - Body mass index is 14.27 kg/m.  Stage 2 pressure ulcer -  Pressure dressing  DVT prophylaxis:  Eliquis  Code Status:  full Family Communication:  Plan to call son this afternoon.  Disposition Plan:  Pending improvement in mentation to HOME since patient has repeatedly refused discharge to SNF   Consultants:   psychiatry  Procedures:  none   Antimicrobials:   none    Subjective:  Refuses or is unable to answer direct ROS questions.  Calling "Bonita Quin" and asking to speak with her.   Objective: Vitals:   06/26/16 0615 06/26/16 0620 06/26/16 1453 06/26/16 2042  BP:  (!) 146/108 105/65 108/60  Pulse:  67 85 97  Resp:  (!) 22 18 18   Temp:  99.4 F (37.4 C) 98.3 F (36.8 C) 99 F (37.2 C)  TempSrc:  Oral Axillary Oral  SpO2:   100%   Weight: 40.1 kg (88 lb 6.5 oz)     Height: 5\' 6"  (1.676 m)       Intake/Output Summary (Last 24 hours) at 06/27/16 1236 Last data filed at 06/27/16 0600  Gross per 24 hour  Intake          2667.85 ml  Output                0 ml  Net          2667.85 ml   [employer]  06/25/16 1826 06/26/16 0615  Weight: 45.4 kg (100 lb) 40.1 kg (88 lb 6.5 oz)    Examination:  General exam:  Cachectic adult female.  Lying on side in bed with head covered.  No acute distress.  HEENT:  NCAT, MMM Respiratory system: Clear to auscultation bilaterally Cardiovascular system: Regular rate and rhythm, normal S1/S2. No murmurs, rubs, gallops or clicks.  Warm extremities Gastrointestinal system:  Refused abdominal exam MSK:  Normal tone and bulk, no lower extremity edema Neuro:  Grossly intact   Data Reviewed: I have personally reviewed following labs and imaging studies  CBC:  Recent Labs Lab 06/25/16 2124  WBC 7.4    NEUTROABS 4.4  HGB 11.1*  HCT 31.8*  MCV 97.2  PLT 161   Basic Metabolic Panel:  Recent Labs Lab 06/25/16 2124  NA 143  K 3.8  CL 109  CO2 25  GLUCOSE 75  BUN <5*  CREATININE 0.61  CALCIUM 7.2*   GFR: Estimated Creatinine Clearance: 49.1 mL/min (by C-G formula based on SCr of 0.61 mg/dL). Liver Function Tests:  Recent Labs Lab 06/25/16 2124  AST 39  ALT 10*  ALKPHOS 106  BILITOT 1.3*  PROT 4.8*  ALBUMIN 2.0*    Recent Labs Lab 06/25/16 2124  LIPASE 12   No results for input(s): AMMONIA in the last 168 hours. Coagulation Profile: No results for input(s): INR, PROTIME in the last 168 hours. Cardiac Enzymes: No results for input(s): CKTOTAL, CKMB, CKMBINDEX, TROPONINI in the last 168 hours. BNP (last 3 results) No results for input(s): PROBNP in the last 8760 hours. HbA1C: No results for input(s): HGBA1C in the last 72 hours. CBG:  Recent Labs Lab 06/25/16 2107  GLUCAP 70   Lipid Profile: No results for input(s): CHOL, HDL, LDLCALC, TRIG, CHOLHDL, LDLDIRECT in the last 72 hours. Thyroid Function Tests: No results for input(s): TSH, T4TOTAL, FREET4, T3FREE, THYROIDAB in the last 72 hours. Anemia Panel: No results for input(s): VITAMINB12, FOLATE, FERRITIN, TIBC, IRON, RETICCTPCT in the last 72 hours. Urine analysis:    Component Value Date/Time   COLORURINE AMBER (A) 06/26/2016 0000   APPEARANCEUR CLEAR 06/26/2016 0000   LABSPEC 1.017 06/26/2016 0000   PHURINE 7.0 06/26/2016 0000   GLUCOSEU NEGATIVE 06/26/2016 0000   HGBUR NEGATIVE 06/26/2016 0000   BILIRUBINUR SMALL (A) 06/26/2016 0000   KETONESUR 15 (A) 06/26/2016 0000   PROTEINUR NEGATIVE 06/26/2016 0000   UROBILINOGEN 1.0 02/28/2015 1227   NITRITE NEGATIVE 06/26/2016 0000   LEUKOCYTESUR NEGATIVE 06/26/2016 0000    Recent Results (from the past 240 hour(s))  C difficile quick scan w PCR reflex     Status: None   Collection Time: 06/27/16  9:22 AM  Result Value Ref Range Status   C  Diff antigen NEGATIVE NEGATIVE Final   C Diff toxin NEGATIVE NEGATIVE Final   C Diff interpretation No C. difficile detected.  Final      Radiology Studies: Dg Chest 2 View  Result Date: 06/25/2016 CLINICAL DATA:  Generalized weakness and altered mental status. Initial encounter. EXAM: CHEST  2 VIEW COMPARISON:  May 01, 2016 FINDINGS: The heart size and mediastinal contours are stable. The aorta is tortuous. There is no focal infiltrate, pulmonary edema, or pleural effusion. The visualized skeletal structures are unremarkable. IMPRESSION: No active cardiopulmonary disease. Electronically Signed   By: Sherian ReinWei-Chen  Lin M.D.   On: 06/25/2016 21:22   Ct Head Wo Contrast  Result Date: 06/26/2016 CLINICAL DATA:  55 year old female with increased confusion and recent  fall. Altered mental status. EXAM: CT HEAD WITHOUT CONTRAST TECHNIQUE: Contiguous axial images were obtained from the base of the skull through the vertex without intravenous contrast. COMPARISON:  Head CT dated 03/31/2015 FINDINGS: Brain: There is mild age-related atrophy and chronic microvascular ischemic changes. There has been interval resolution of the previously seen diffuse white matter hypodensities. There is no acute intracranial hemorrhage. No mass effect or midline shift noted. No extra-axial fluid collection. Vascular: No hyperdense vessel or unexpected calcification. Skull: Normal. Negative for fracture or focal lesion. Sinuses/Orbits: No acute finding. Other: None IMPRESSION: No acute intracranial hemorrhage. Mild age-related atrophy and chronic microvascular ischemic disease. If symptoms persist and there are no contraindications, MRI may provide better evaluation if clinically indicated Electronically Signed   By: Elgie Collard M.D.   On: 06/26/2016 03:17     Scheduled Meds: . chlorhexidine  15 mL Mouth Rinse BID  . folic acid  1 mg Intravenous Daily  . mouth rinse  15 mL Mouth Rinse q12n4p  . thiamine injection  100 mg  Intravenous Daily  . venlafaxine  25 mg Oral BID WC   Continuous Infusions: . dextrose 5 % and 0.45 % NaCl with KCl 10 mEq/L 125 mL/hr at 06/27/16 0915  . heparin 550 Units/hr (06/27/16 0211)     LOS: 1 day    Time spent: 30 min    Debbora Presto, MD [facility] Pager [phone]  If 7PM-7AM, please contact night-coverage [email or web address] Password [facility] 06/27/2016, 12:36 PM

## 2016-06-27 NOTE — Evaluation (Signed)
Physical Therapy Evaluation Patient Details Name: [name] MRN: [id number] DOB: 04/12/1961 Today's Date: 06/27/2016   History of Present Illness  [name] is a 55 y.o. female with history of PE and DVT on Apixaban, hypothyroidism, chronic pain and previous history of diabetes mellitus was brought to the ER after patient was found to be increasingly confused. The duration of confusion is not clear s. CT of head unremarkable.  Clinical Impression  PT evaluation limited due to patient's inability to participate. Assisted with bed mobility with 2 persons due to agitation. Pt admitted with above diagnosis. Pt currently with functional limitations due to the deficits listed below (see PT Problem List). Pt will benefit from skilled PT to increase their independence and safety with mobility to allow discharge to the venue listed below.       Follow Up Recommendations SNF;Supervision/Assistance - 24 hour    Equipment Recommendations  None recommended by PT    Recommendations for Other Services       Precautions / Restrictions Precautions Precautions: Fall Precaution Comments: aggressive, has long nails,, combative      Mobility  Bed Mobility Overal bed mobility: Needs Assistance;+2 for physical assistance;+ 2 for safety/equipment Bed Mobility: Rolling Rolling: Max assist;+2 for physical assistance;+2 for safety/equipment         General bed mobility comments: the patient is resistive  with mobility. At times , she would roll but generally is resisitive. Unable to proceed with sitting up due to  resisitiveness.  Transfers                    Ambulation/Gait                Stairs            Wheelchair Mobility    Modified Rankin (Stroke Patients Only)       Balance                                             Pertinent Vitals/Pain Pain Assessment: Faces Faces Pain Scale: Hurts a little bit Pain Intervention(s): Monitored  during session    Home Living Family/patient expects to be discharged to:: Unsure                 Additional Comments: patient is unable to participate in history    Prior Function                 Hand Dominance        Extremity/Trunk Assessment   Upper Extremity Assessment: Generalized weakness (moves arms about)           Lower Extremity Assessment: Difficult to assess due to impaired cognition         Communication      Cognition Arousal/Alertness: Awake/alert Behavior During Therapy: Agitated;Anxious Overall Cognitive Status: No family/caregiver present to determine baseline cognitive functioning Area of Impairment: Orientation               General Comments: Patient is unable to participate, yelling out"stop it" constantly    General Comments      Exercises     Assessment/Plan    PT Assessment Patient needs continued PT services  PT Problem List Decreased strength;Decreased activity tolerance;Decreased mobility;Decreased cognition;Decreased safety awareness          PT Treatment Interventions DME instruction;Gait training;Functional mobility training;Therapeutic  activities;Therapeutic exercise;Cognitive remediation;Patient/family education    PT Goals (Current goals can be found in the Care Plan section)  Acute Rehab PT Goals Patient Stated Goal: unable  PT Goal Formulation: Patient unable to participate in goal setting Time For Goal Achievement: 07/11/16 Potential to Achieve Goals: Fair    Frequency Min 2X/week   Barriers to discharge Decreased caregiver support      Co-evaluation               End of Session   Activity Tolerance: Treatment limited secondary to agitation Patient left: in bed;with call bell/phone within reach;with bed alarm set Nurse Communication: Mobility status         Time: 4628-6381 PT Time Calculation (min) (ACUTE ONLY): 27 min   Charges:   PT Evaluation $PT Eval Moderate  Complexity: 1 Procedure PT Treatments $Therapeutic Activity: 8-22 mins   PT G Codes:        Rada Hay 06/27/2016, 1:55 PM

## 2016-06-27 NOTE — Evaluation (Signed)
Clinical/Bedside Swallow Evaluation Patient Details  Name: SONOMA THISSEN MRN: [id number] Date of Birth: 06-19-1961  Today's Date: 06/27/2016 Time: SLP Start Time (ACUTE ONLY): 1117 SLP Stop Time (ACUTE ONLY): 1126 SLP Time Calculation (min) (ACUTE ONLY): 9 min  Past Medical History:  Past Medical History:  Diagnosis Date  . Anxiety   . Arthritis    "a little bit; all over" (05/01/2016)  . Asthma   . Asthma   . C. difficile colitis   . Chronic lower back pain   . Crohn disease (HCC)   . Diabetes mellitus without complication (HCC)   . DVT (deep venous thrombosis) (HCC) ~ 03/2016   LLE  . Elevated lactic acid level   . GERD (gastroesophageal reflux disease)   . Glaucoma   . HLD (hyperlipidemia)   . Hypertension   . Hypothyroidism   . Neuropathy (HCC)   . Pancreatitis   . Pulmonary embolus (HCC) 05/24/2014  . Thrombocytopenia (HCC)   . Tobacco abuse    Past Surgical History:  Past Surgical History:  Procedure Laterality Date  . CESAREAN SECTION  1996  . COLONOSCOPY N/A 03/02/2015   Procedure: COLONOSCOPY;  Surgeon: Bernette Redbird, MD;  Location[facility] ENDOSCOPY;  Service: Endoscopy;  Laterality: N/A;  . ESOPHAGOGASTRODUODENOSCOPY N/A 05/21/2014   Procedure: ESOPHAGOGASTRODUODENOSCOPY (EGD);  Surgeon: Petra Kuba, MD;  Location: [facility] ENDOSCOPY;  Service: Endoscopy;  Laterality: N/A;  . ESOPHAGOGASTRODUODENOSCOPY N/A 03/02/2015   Procedure: ESOPHAGOGASTRODUODENOSCOPY (EGD);  Surgeon: Bernette Redbird, MD;  Location: [facility] ENDOSCOPY;  Service: Endoscopy;  Laterality: N/A;   HPI:  Sheyanna Ohmann Burnsis a 55 y.o.femalewith history of PE, DVT, hypothyroidism, asthma, GERD, chronic pain, diabetes mellitus brought to the ER after patient was found to be increasingly confused, acute encephalopathy.  CT of the head is unremarkable. Chest x-ray and UA does not show any infective source.   Assessment / Plan / Recommendation Clinical Impression  Pt requires repetition, simple directions due to confusion  and difficulty processing information. Agreeable to 2-3 cup sips thin and 1 trial cracker. No overt s/s aspiration or pocketing during limited assessment. RN assessment of lung sounds are diminished, CXR 9/25 clear. Recommend initiate Dys 3 ( due to confusion) and thin liquids, attempt pills with thin (if difficulty, whole in applseauce) and full supervision. ST will follow.      Aspiration Risk   (mild-mod)    Diet Recommendation Dysphagia 3 (Mech soft);Thin liquid   Liquid Administration via: Cup;Straw Medication Administration: Whole meds with liquid Supervision: Patient able to self feed;Full supervision/cueing for compensatory strategies Compensations: Slow rate;Small sips/bites;Minimize environmental distractions Postural Changes: Seated upright at 90 degrees    Other  Recommendations Oral Care Recommendations: Oral care BID   Follow up Recommendations None      Frequency and Duration min 2x/week  2 weeks       Prognosis Prognosis for Safe Diet Advancement: Good Barriers to Reach Goals: Cognitive deficits      Swallow Study   General HPI: Janney Henly Burnsis a 55 y.o.femalewith history of PE, DVT, hypothyroidism, asthma, GERD, chronic pain, diabetes mellitus brought to the ER after patient was found to be increasingly confused, acute encephalopathy.  CT of the head is unremarkable. Chest x-ray and UA does not show any infective source. Type of Study: Bedside Swallow Evaluation Previous Swallow Assessment:  (no) Diet Prior to this Study: NPO Temperature Spikes Noted: Yes (min 99) Respiratory Status: Room air History of Recent Intubation: No Behavior/Cognition: Alert;Requires cueing;Confused;Cooperative Oral Cavity Assessment: Dry Oral Care  Completed by SLP: No Oral Cavity - Dentition: Adequate natural dentition Vision: Functional for self-feeding Self-Feeding Abilities: Able to feed self Patient Positioning: Upright in bed Baseline Vocal Quality: Normal Volitional  Cough:  (unable to elicit-confusion) Volitional Swallow:  (unable to elicit-confusion)    Oral/Motor/Sensory Function Overall Oral Motor/Sensory Function:  (no focal weakness, confused unable to elicit)   Ice Chips Ice chips: Not tested   Thin Liquid Thin Liquid: Within functional limits Presentation: Cup    Nectar Thick Nectar Thick Liquid: Not tested   Honey Thick Honey Thick Liquid: Not tested   Puree Puree: Not tested   Solid   GO   Solid: Within functional limits        Royce MacadamiaLitaker, Afomia Blackley Willis 06/27/2016,11:40 AM  Breck CoonsLisa Willis Lonell FaceLitaker M.Ed [employer]SLP Pager [phone]

## 2016-06-27 NOTE — Progress Notes (Signed)
[name]. LCSWA consulted attempted to meet with patient.  LCSWA learned from RN, the patient has not been communicating but continues to say yes, no to commands. The patient is refusing medications and treatment at this time.  LCSWA contacted patient son-[name] 336. [phone]. He reports the patient has been exhibiting similar behaviors at home for the last week week in half. He reports this is not the patients baseline. Pt. son requested to speak with Dr. And RN to learn what is happening medically.  LCSWA informed patient RN-Sophia.

## 2016-06-27 NOTE — Progress Notes (Addendum)
Pt was active with [facility]/Dawn, RN [phone]) before admission. Pt is very confused at present time. Will continue to follow.

## 2016-06-27 NOTE — Progress Notes (Signed)
Pt continue to yell out and is NOT cooperative with staff, including lab, attemptyed to assist lab in obtaining labs, patient yelling out, swinging her arims, stating NO NO or Yes Yes, calling out people who are not present. Pt is confused, inct and is not able to follow commands.  Care giver at bedside and MD notified. SRP, RN

## 2016-06-27 NOTE — Progress Notes (Signed)
Unablle to obtain 2 pm VS pt swinging arms around. SRP, RN

## 2016-06-27 NOTE — Progress Notes (Signed)
ANTICOAGULATION CONSULT NOTE - Follow Up Consult  Pharmacy Consult for Heparin Indication: Hx DVT/PE, PTA Eliquis held  Allergies  Allergen Reactions  . Iohexol Other (See Comments)    "severe burning" Patient has received Contrast in 2005 with 13 hour pre-medication, and had no reaction at that time  . Spiriva Handihaler [Tiotropium Bromide Monohydrate] Nausea And Vomiting  . Iodinated Diagnostic Agents Other (See Comments)  . Ativan [Lorazepam] Other (See Comments)    hallucinations  . Penicillins Hives    Has had cephalosporins    Patient Measurements: Height: 5\' 6"  (167.6 cm) Weight: 88 lb 6.5 oz (40.1 kg) IBW/kg (Calculated) : 59.3   Vital Signs: Temp: 99 F (37.2 C) (09/26 2042) Temp Source: Oral (09/26 2042) BP: 108/60 (09/26 2042) Pulse Rate: 97 (09/26 2042)  Labs:  Recent Labs  06/25/16 2124 06/26/16 1317 06/26/16 1319 06/26/16 2140 06/26/16 2141  HGB 11.1*  --   --   --   --   HCT 31.8*  --   --   --   --   PLT 161  --   --   --   --   APTT  --  104*  --  196*  --   HEPARINUNFRC  --   --  0.64  --  1.06*  CREATININE 0.61  --   --   --   --     Estimated Creatinine Clearance: 49.1 mL/min (by C-G formula based on SCr of 0.61 mg/dL).   Medications:  Scheduled:  . chlorhexidine  15 mL Mouth Rinse BID  . folic acid  1 mg Intravenous Daily  . mouth rinse  15 mL Mouth Rinse q12n4p  . thiamine injection  100 mg Intravenous Daily  . venlafaxine  25 mg Oral BID WC   Infusions:  . dextrose 5 % and 0.45 % NaCl with KCl 10 mEq/L 125 mL/hr at 06/27/16 0213  . heparin 550 Units/hr (06/27/16 0211)    Assessment: [age] yr female with PMH significant DM, DVT/PE who is on Eliquis 5mg  po BID PTA.  Presents with weakness, confusion and body aches.  Pt to be admitted for acute encephalopathy.  Pharmacy consulted to dose IV heparin while Eliquis is held.  Patient's last reported dose of Eliquis 5mg  = 9/25 @ 10:00  Today, 06/27/2016:  Heparin level and APTT unable  to be obtained.  SCr 0.61  CBC: Hgb 11.1, Plt 161  No bleeding or IV complications reported.  Swallow eval recommended dysphagia 3 diet; pills with thin liquids or with applesauce.   Goal of Therapy:  Heparin level 0.3-0.7 units/ml aPTT 66-102 seconds Monitor platelets by anticoagulation protocol: Yes   Plan:   D/C heparin IV infusion   MD to resume Eliquis 5mg  PO BID.    Lynann Beaverhristine Keasia Dubose PharmD, BCPS Pager [phone] 06/27/2016 1:05 PM

## 2016-06-27 NOTE — Consult Note (Signed)
[facility] Face-to-Face Psychiatry Consult   Reason for Consult:  Question adverse reaction from psychiatric medications Referring Physician:  Dr. Sheran Fava  Patient Identification: [name] MRN:  [id number] Principal Diagnosis: Delirium Diagnosis:   Patient Active Problem List   Diagnosis Date Noted  . Delirium [R41.0] 06/26/2016  . Pressure injury of skin [L89.90] 06/26/2016  . Abdominal pain, generalized [R10.84]   . Abdominal pain [R10.9]   . AKI (acute kidney injury) ([place]) [N17.9]   . Sepsis ([place]) [A41.9]   . UTI (lower urinary tract infection) [N39.0] 02/28/2016  . 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 Excoriated rash-medial thighs [R21] 02/28/2016  . DVT (deep venous thrombosis), unspecified laterality [I82.409] 01/29/2016  . Bilateral tibial fractures [S82.201A, S82.202A]   . Hypothyroidism [E03.9]   . GERD (gastroesophageal reflux disease) [K21.9]   . HLD (hyperlipidemia) [E78.5]   . Enteritis due to Clostridium difficile [A04.7] 03/23/2015  . Thrombocytopenia ([place]) [D69.6] 03/21/2015  . Weakness of back [M62.81] 03/21/2015  . Chronic pain [G89.29] 02/28/2015  . Severe protein-calorie malnutrition ([place]) [E43] 12/30/2014  . Hepatic steatosis [K76.0] 12/29/2014  . Chronic diarrhea of unknown origin [K52.9] 12/29/2014  . Elevated lactic acid level [E87.2]   . Dehydration [E86.0] 07/26/2014  . Arterial hypotension [I95.9] 07/26/2014  . History of pulmonary embolus (PE) [Z86.711] 05/24/2014  . Physical deconditioning [R53.81] 05/14/2014  . Nicotine abuse [F19.10] 05/08/2014  . Normocytic anemia [D64.9] 10/15/2012  . Metabolic acidosis [U20.2] 10/14/2012  . Diabetes mellitus with complication ([place]) [R42.7] 05/12/2007  . Anxiety state [F41.1] 05/12/2007  . Depression [F32.9] 05/12/2007  . Peripheral neuropathy ([place]) [G62.9] 05/12/2007  . HTN (hypertension) [I10] 05/12/2007  . ALLERGIC RHINITIS [J30.9] 05/12/2007  . Asthma [J45.909] 05/12/2007  . PANCREATITIS, HX OF  [Z87.19] 05/12/2007    Total Time spent with patient: 30 minutes  Subjective:   [name] DESCOTEAUX is a 55 y.o. female patient admitted with worsening confusion and generalized aches, weakness.  HPI: Of note, patient is poor historian, provides minimal information at this time . I have reviewed chart notes, spoken with RN and also spoke via phone  with her son [name], with whom she lives. She is a 55 year old female, lives with son, presented to ED on 9/25. Son reported patient has been increasingly confused recently .  She has a history of chronic medical illnesses, as below, to include history of DVT, PE, Hypothyroidism, DM.  As per progress notes, she also  has a history of  alcohol /polysubstance abuse, history of  prior admissions, and history of complicated disposition planning due to requesting but then refusing SNF placement . At this time work up has been complicated by patient refusing  bloodwork .  Her admission UDS is positive for opiates only. No admission BAL reported . Patient's son reports that patient has declined significantly over the last 2-3 weeks, with worsening confusion, without any readily identifiable reason. Son states he does not think patient has been drinking heavily or regularly recently . Patient denies any recent alcohol use or any drug abuse .  At this time patient is awake, minimally cooperative, face covered by bed sheet for most of session, answering only some questions .She does seem to understand what  is being asked and answers , although short and sporadic, are appropriate to the question. She perseverates , and often answers questions  by repetitively using monosyllables such as " no, no, no, no, no".  She appears confused and did not or could not answer any questions about  her orientation .   Past Psychiatric History: minimal information available at this time - patient denies psychiatric history at present . As per chart , was being prescribed Effexor XR 25  mgrs BID, Trazodone 25 mgrs QHS . Compliance is reported as poor .   Risk to Self: Is patient at risk for suicide?: No Risk to Others:   Prior Inpatient Therapy:   Prior Outpatient Therapy:    Past Medical History:  Past Medical History:  Diagnosis Date  . Anxiety   . Arthritis    "a little bit; all over" (05/01/2016)  . Asthma   . Asthma   . C. difficile colitis   . Chronic lower back pain   . Crohn disease ([place])   . Diabetes mellitus without complication ([place])   . DVT (deep venous thrombosis) ([place]) ~ 03/2016   LLE  . Elevated lactic acid level   . GERD (gastroesophageal reflux disease)   . Glaucoma   . HLD (hyperlipidemia)   . Hypertension   . Hypothyroidism   . Neuropathy ([place])   . Pancreatitis   . Pulmonary embolus ([place]) 05/24/2014  . Thrombocytopenia ([place])   . Tobacco abuse     Past Surgical History:  Procedure Laterality Date  . CESAREAN SECTION  1996  . COLONOSCOPY N/A 03/02/2015   Procedure: COLONOSCOPY;  Surgeon: Ronald Lobo, MD;  Location[facility] ENDOSCOPY;  Service: Endoscopy;  Laterality: N/A;  . ESOPHAGOGASTRODUODENOSCOPY N/A 05/21/2014   Procedure: ESOPHAGOGASTRODUODENOSCOPY (EGD);  Surgeon: Jeryl Columbia, MD;  Location: [facility] ENDOSCOPY;  Service: Endoscopy;  Laterality: N/A;  . ESOPHAGOGASTRODUODENOSCOPY N/A 03/02/2015   Procedure: ESOPHAGOGASTRODUODENOSCOPY (EGD);  Surgeon: Ronald Lobo, MD;  Location: [facility] ENDOSCOPY;  Service: Endoscopy;  Laterality: N/A;   Family History:  Family History  Problem Relation Age of Onset  . Breast cancer Mother   . Heart disease Mother 19    CABG  . Diabetes Mother   . Heart disease Father 52    CABG  . Diabetes Father    Family Psychiatric  History: non contributory  Social History:  Lives with adult son, on disability .  History  Alcohol Use No     History  Drug Use No    Social History   Social History  . Marital status: Divorced    Spouse name: N/A  . Number of children: N/A  . Years of education: N/A    Social History Main Topics  . Smoking status: Current Every Day Smoker    Packs/day: 0.25    Years: 37.00    Types: Cigarettes  . Smokeless tobacco: Never Used  . Alcohol use No  . Drug use: No  . Sexual activity: No   Other Topics Concern  . None   Social History Narrative  . None   Additional Social History:    Allergies:   Allergies  Allergen Reactions  . Iohexol Other (See Comments)    "severe burning" Patient has received Contrast in 2005 with 13 hour pre-medication, and had no reaction at that time  . Spiriva Handihaler [Tiotropium Bromide Monohydrate] Nausea And Vomiting  . Iodinated Diagnostic Agents Other (See Comments)  . Ativan [Lorazepam] Other (See Comments)    hallucinations  . Penicillins Hives    Has had cephalosporins    Labs:  Results for orders placed or performed during the hospital encounter of 06/25/16 (from the past 48 hour(s))  CBG monitoring, ED     Status: None   Collection Time: 06/25/16  9:07 PM  Result  Value Ref Range   Glucose-Capillary 70 65 - 99 mg/dL  CBC with Differential     Status: Abnormal   Collection Time: 06/25/16  9:24 PM  Result Value Ref Range   WBC 7.4 4.0 - 10.5 K/uL   RBC 3.27 (L) 3.87 - 5.11 MIL/uL   Hemoglobin 11.1 (L) 12.0 - 15.0 g/dL   HCT 31.8 (L) 36.0 - 46.0 %   MCV 97.2 78.0 - 100.0 fL   MCH 33.9 26.0 - 34.0 pg   MCHC 34.9 30.0 - 36.0 g/dL   RDW 15.0 11.5 - 15.5 %   Platelets 161 150 - 400 K/uL   Neutrophils Relative % 59 %   Neutro Abs 4.4 1.7 - 7.7 K/uL   Lymphocytes Relative 32 %   Lymphs Abs 2.4 0.7 - 4.0 K/uL   Monocytes Relative 7 %   Monocytes Absolute 0.5 0.1 - 1.0 K/uL   Eosinophils Relative 1 %   Eosinophils Absolute 0.1 0.0 - 0.7 K/uL   Basophils Relative 1 %   Basophils Absolute 0.1 0.0 - 0.1 K/uL  Comprehensive metabolic panel     Status: Abnormal   Collection Time: 06/25/16  9:24 PM  Result Value Ref Range   Sodium 143 135 - 145 mmol/L   Potassium 3.8 3.5 - 5.1 mmol/L   Chloride  109 101 - 111 mmol/L   CO2 25 22 - 32 mmol/L   Glucose, Bld 75 65 - 99 mg/dL   BUN <5 (L) 6 - 20 mg/dL   Creatinine, Ser 0.61 0.44 - 1.00 mg/dL   Calcium 7.2 (L) 8.9 - 10.3 mg/dL   Total Protein 4.8 (L) 6.5 - 8.1 g/dL   Albumin 2.0 (L) 3.5 - 5.0 g/dL   AST 39 15 - 41 U/L   ALT 10 (L) 14 - 54 U/L   Alkaline Phosphatase 106 38 - 126 U/L   Total Bilirubin 1.3 (H) 0.3 - 1.2 mg/dL   GFR calc non Af Amer >60 >60 mL/min   GFR calc Af Amer >60 >60 mL/min    Comment: (NOTE) The eGFR has been calculated using the CKD EPI equation. This calculation has not been validated in all clinical situations. eGFR's persistently <60 mL/min signify possible Chronic Kidney Disease.    Anion gap 9 5 - 15  Lipase, blood     Status: None   Collection Time: 06/25/16  9:24 PM  Result Value Ref Range   Lipase 12 11 - 51 U/L  I-Stat CG4 Lactic Acid, ED     Status: None   Collection Time: 06/25/16  9:33 PM  Result Value Ref Range   Lactic Acid, Venous 1.82 0.5 - 1.9 mmol/L  Urinalysis, Routine w reflex microscopic (not at [facility])     Status: Abnormal   Collection Time: 06/26/16 12:00 AM  Result Value Ref Range   Color, Urine AMBER (A) YELLOW    Comment: BIOCHEMICALS MAY BE AFFECTED BY COLOR   APPearance CLEAR CLEAR   Specific Gravity, Urine 1.017 1.005 - 1.030   pH 7.0 5.0 - 8.0   Glucose, UA NEGATIVE NEGATIVE mg/dL   Hgb urine dipstick NEGATIVE NEGATIVE   Bilirubin Urine SMALL (A) NEGATIVE   Ketones, ur 15 (A) NEGATIVE mg/dL   Protein, ur NEGATIVE NEGATIVE mg/dL   Nitrite NEGATIVE NEGATIVE   Leukocytes, UA NEGATIVE NEGATIVE    Comment: MICROSCOPIC NOT DONE ON URINES WITH NEGATIVE PROTEIN, BLOOD, LEUKOCYTES, NITRITE, OR GLUCOSE <1000 mg/dL.  Rapid urine drug screen (hospital performed)  Status: Abnormal   Collection Time: 06/26/16 12:00 AM  Result Value Ref Range   Opiates POSITIVE (A) NONE DETECTED   Cocaine NONE DETECTED NONE DETECTED   Benzodiazepines NONE DETECTED NONE DETECTED   Amphetamines  NONE DETECTED NONE DETECTED   Tetrahydrocannabinol NONE DETECTED NONE DETECTED   Barbiturates NONE DETECTED NONE DETECTED    Comment:        DRUG SCREEN FOR MEDICAL PURPOSES ONLY.  IF CONFIRMATION IS NEEDED FOR ANY PURPOSE, NOTIFY LAB WITHIN 5 DAYS.        LOWEST DETECTABLE LIMITS FOR URINE DRUG SCREEN Drug Class       Cutoff (ng/mL) Amphetamine      1000 Barbiturate      200 Benzodiazepine   732 Tricyclics       202 Opiates          300 Cocaine          300 THC              50   Acetaminophen level     Status: Abnormal   Collection Time: 06/26/16  4:35 AM  Result Value Ref Range   Acetaminophen (Tylenol), Serum <10 (L) 10 - 30 ug/mL    Comment:        THERAPEUTIC CONCENTRATIONS VARY SIGNIFICANTLY. A RANGE OF 10-30 ug/mL MAY BE AN EFFECTIVE CONCENTRATION FOR MANY PATIENTS. HOWEVER, SOME ARE BEST TREATED AT CONCENTRATIONS OUTSIDE THIS RANGE. ACETAMINOPHEN CONCENTRATIONS >150 ug/mL AT 4 HOURS AFTER INGESTION AND >50 ug/mL AT 12 HOURS AFTER INGESTION ARE OFTEN ASSOCIATED WITH TOXIC REACTIONS.   Salicylate level     Status: None   Collection Time: 06/26/16  4:35 AM  Result Value Ref Range   Salicylate Lvl <5.4 2.8 - 30.0 mg/dL  I-Stat CG4 Lactic Acid, ED     Status: Abnormal   Collection Time: 06/26/16  4:41 AM  Result Value Ref Range   Lactic Acid, Venous 3.30 (HH) 0.5 - 1.9 mmol/L  APTT     Status: Abnormal   Collection Time: 06/26/16  1:17 PM  Result Value Ref Range   aPTT 104 (H) 24 - 36 seconds    Comment:        IF BASELINE aPTT IS ELEVATED, SUGGEST PATIENT RISK ASSESSMENT BE USED TO DETERMINE APPROPRIATE ANTICOAGULANT THERAPY.   Heparin level (unfractionated)     Status: None   Collection Time: 06/26/16  1:19 PM  Result Value Ref Range   Heparin Unfractionated 0.64 0.30 - 0.70 IU/mL    Comment:        IF HEPARIN RESULTS ARE BELOW EXPECTED VALUES, AND PATIENT DOSAGE HAS BEEN CONFIRMED, SUGGEST FOLLOW UP TESTING OF ANTITHROMBIN III LEVELS.   APTT      Status: Abnormal   Collection Time: 06/26/16  9:40 PM  Result Value Ref Range   aPTT 196 (HH) 24 - 36 seconds    Comment:        IF BASELINE aPTT IS ELEVATED, SUGGEST PATIENT RISK ASSESSMENT BE USED TO DETERMINE APPROPRIATE ANTICOAGULANT THERAPY. SPECIMEN CHECKED FOR CLOTS REPEATED TO VERIFY CRITICAL RESULT CALLED TO, READ BACK BY AND VERIFIED WITH: S.SEAGRAVES,RN 2252 06/26/16 W.SHEA   Heparin level (unfractionated)     Status: Abnormal   Collection Time: 06/26/16  9:41 PM  Result Value Ref Range   Heparin Unfractionated 1.06 (H) 0.30 - 0.70 IU/mL    Comment:        IF HEPARIN RESULTS ARE BELOW EXPECTED VALUES, AND PATIENT DOSAGE HAS BEEN CONFIRMED, SUGGEST FOLLOW UP  TESTING OF ANTITHROMBIN III LEVELS.   C difficile quick scan w PCR reflex     Status: None   Collection Time: 06/27/16  9:22 AM  Result Value Ref Range   C Diff antigen NEGATIVE NEGATIVE   C Diff toxin NEGATIVE NEGATIVE   C Diff interpretation No C. difficile detected.     Current Facility-Administered Medications  Medication Dose Route Frequency Provider Last Rate Last Dose  . apixaban (ELIQUIS) tablet 5 mg  5 mg Oral BID Theodis Blaze, MD   5 mg at 06/27/16 1341  . chlorhexidine (PERIDEX) 0.12 % solution 15 mL  15 mL Mouth Rinse BID Janece Canterbury, MD   15 mL at 06/27/16 1342  . folic acid injection 1 mg  1 mg Intravenous Daily Janece Canterbury, MD   1 mg at 06/26/16 1509  . HYDROmorphone (DILAUDID) injection 1 mg  1 mg Intravenous Q2H PRN Theodis Blaze, MD   1 mg at 06/27/16 0915  . MEDLINE mouth rinse  15 mL Mouth Rinse q12n4p Janece Canterbury, MD   15 mL at 06/27/16 1342  . thiamine (B-1) injection 100 mg  100 mg Intravenous Daily Janece Canterbury, MD   100 mg at 06/27/16 1340  . venlafaxine (EFFEXOR) tablet 25 mg  25 mg Oral BID WC Janece Canterbury, MD   Stopped at 06/27/16 [phone]    Musculoskeletal: Strength & Muscle Tone: within normal limits Gait & Station: gait not examined  Patient leans:  N/A  Psychiatric Specialty Exam: obtaining a full MSE at this time is difficult due to patient's limited verbal output . Physical Exam  ROS denies headache , denies chest pain, denies shortness of breath, denies vomiting   Blood pressure 108/60, pulse 97, temperature 99 F (37.2 C), temperature source Oral, resp. rate 18, height 5' 6"  (1.676 m), weight 88 lb 6.5 oz (40.1 kg), last menstrual period 10/01/2010, SpO2 100 %.Body mass index is 14.27 kg/m.  General Appearance: Fairly Groomed in hospital garb, appears undernourished and chronically ill   Eye Contact:  Minimal  Speech:  Slow  Volume:  Normal  Mood:  denies depression, presents somewhat irritable   Affect:  irritable   Thought Process: slowed, perseverates   Orientation:  Other:  appears disoriented to time and place   Thought Content:  denies hallucinations, no delusions are expressed   Suicidal Thoughts:  No she denies any suicidal ideations, denies any homicidal or violent ideations   Homicidal Thoughts:  No  Memory:  recent and remote currently poor   Judgement:  Impaired  Insight:  limited   Psychomotor Activity:  in bed, vaguely restless   Concentration:  Concentration: Poor and Attention Span: Poor  Recall:  Poor  Fund of Knowledge:  Poor  Language:  Poor  Akathisia:  Negative  Handed:  Right  AIMS (if indicated):     Assets:  Desire for Improvement Resilience  ADL's:  Impaired  Cognition:  Impaired,  Moderate  Sleep:     Assessment - patient is a 55 year old female, who has chronic medical illnesses ,and who presents undernourished and chronically ill. She presents with worsening confusion x about two weeks as per son. She is currently awake, but presents confused, perseverative,and is only superficially cooperative with interview/session . She minimizes depression, denies psychotic symptoms, denies any suicidal ideations . She denies any recent alcohol consumption , and vitals are currently stable . At this time  etiology of mental status changes is not clear, may be multi-factorial.  Treatment Plan Summary: as below   Disposition: No evidence of imminent risk to self or others at present.   Patient does not meet criteria for psychiatric inpatient admission. At this time I do not suspect antidepressant management ( Effexor XR) is causing or contributing to altered mental status-her  presentation does not seem consistent with serotonin syndrome. However, as etiology of confusion is not currently clear, would D/C Effexor XR until she is further stabilized, at which time may restart at lower dose. ( it is unlikely she will develop withdrawal, as it appears compliance was limited )   Would continue medical work up as appropriate for underlying causes of delirium.  Based on poor nutritional status and history of hypothyroidism with recent suspected medication non compliance, would consider rechecking TSH and Thyroid Hormones, as well as B12, Vitamin D levels .  Agree with Folate supplementation   Consider low dose Haldol ( ie : 1-2 mgrs Q 6 hours )as  PRN for agitation. Would avoid Ativan PRNs , as report from Nursing staff is that this medication resulted in paradoxical disinhibition yesterday .   Neita Garnet, MD 06/27/2016 4:35 PM

## 2016-06-28 ENCOUNTER — Other Ambulatory Visit: Payer: Self-pay | Admitting: *Deleted

## 2016-06-28 IMAGING — CR DG CHEST 2V
2 series · 2 of 2 positions shown · non-contrast
Comparison: Portable exam 2337 hr compared to 07/26/2014

CLINICAL DATA: Dyspnea, personal history of diabetes mellitus,
hypertension, asthma, Crohn's disease

EXAM:
CHEST  2 VIEW

[w chest lat]
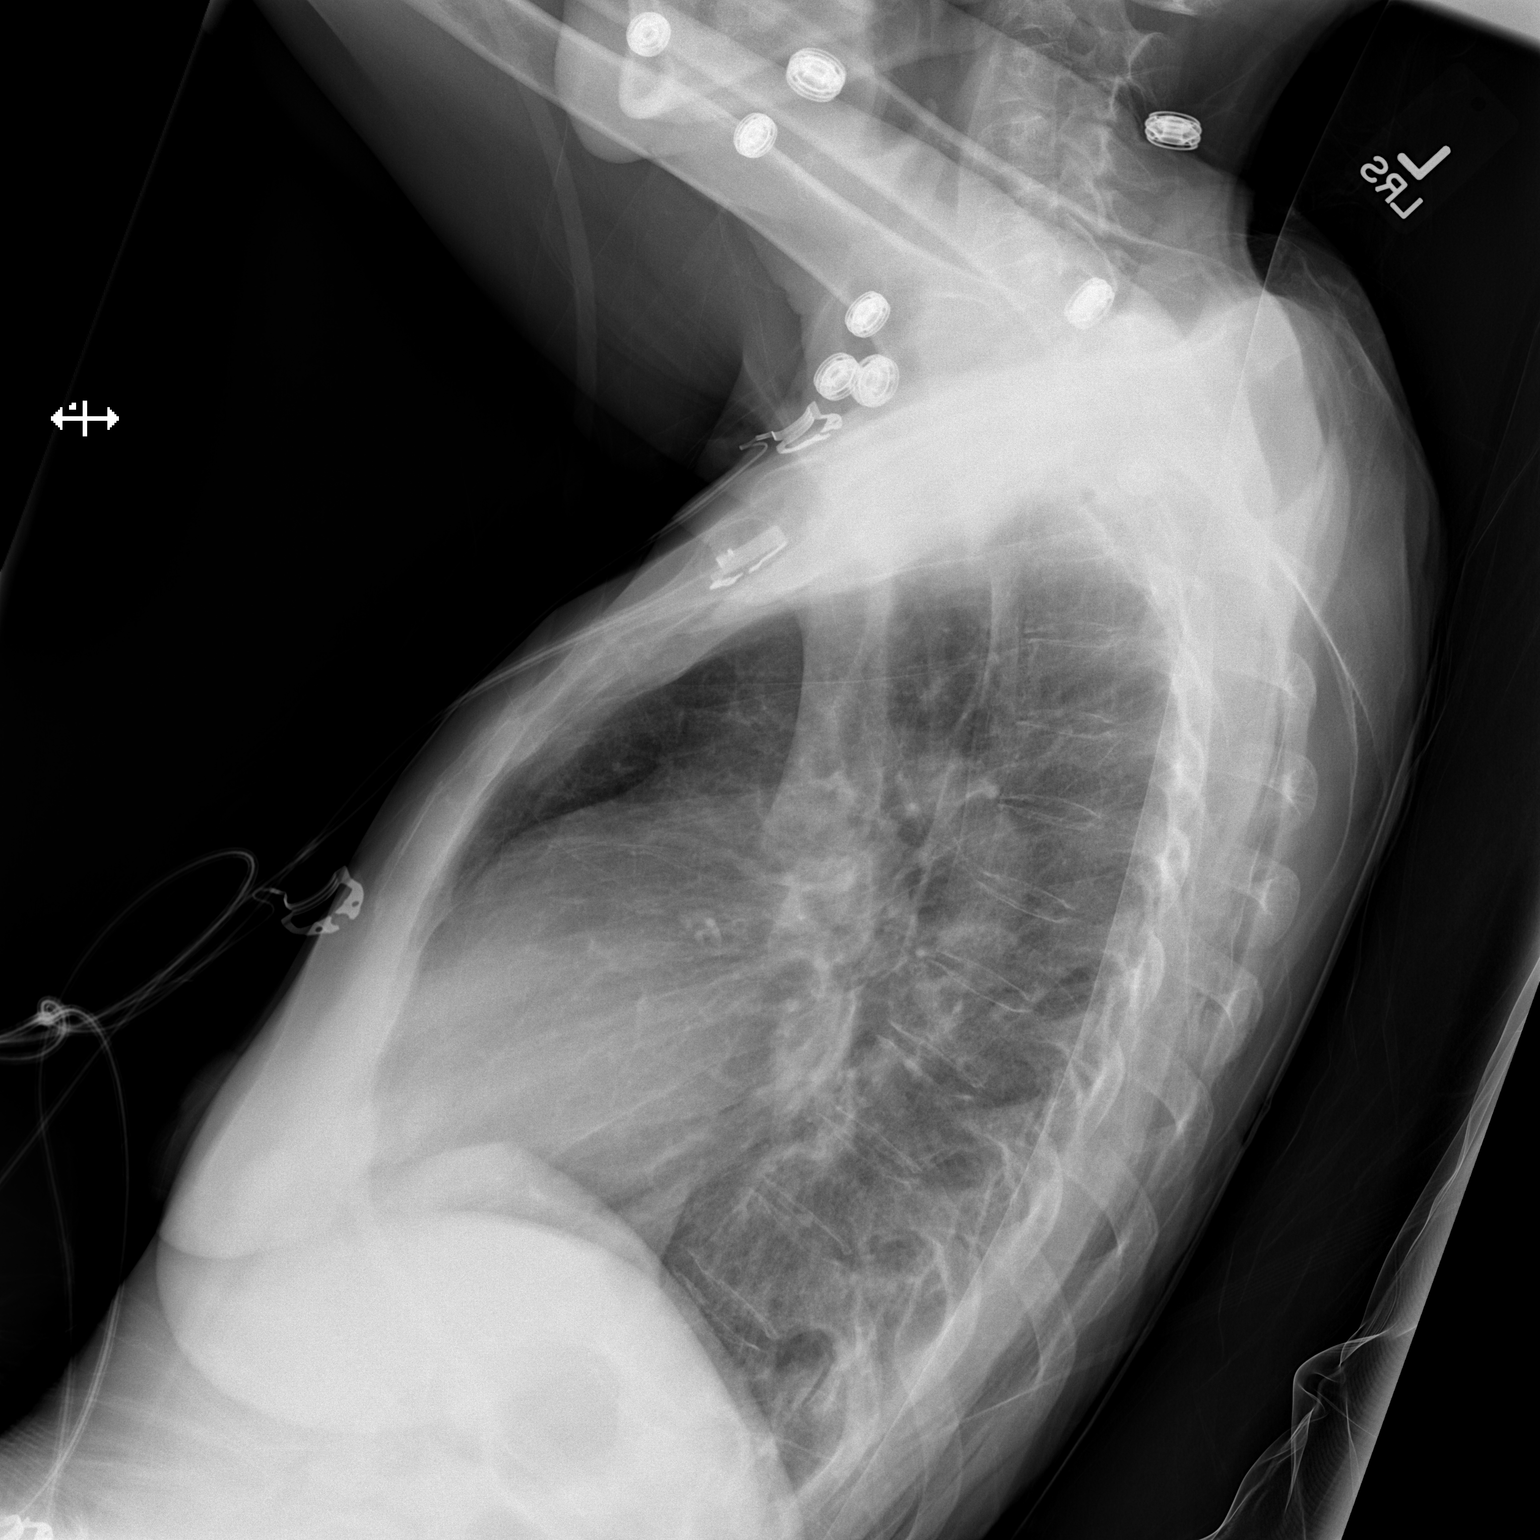

[x chest ap]
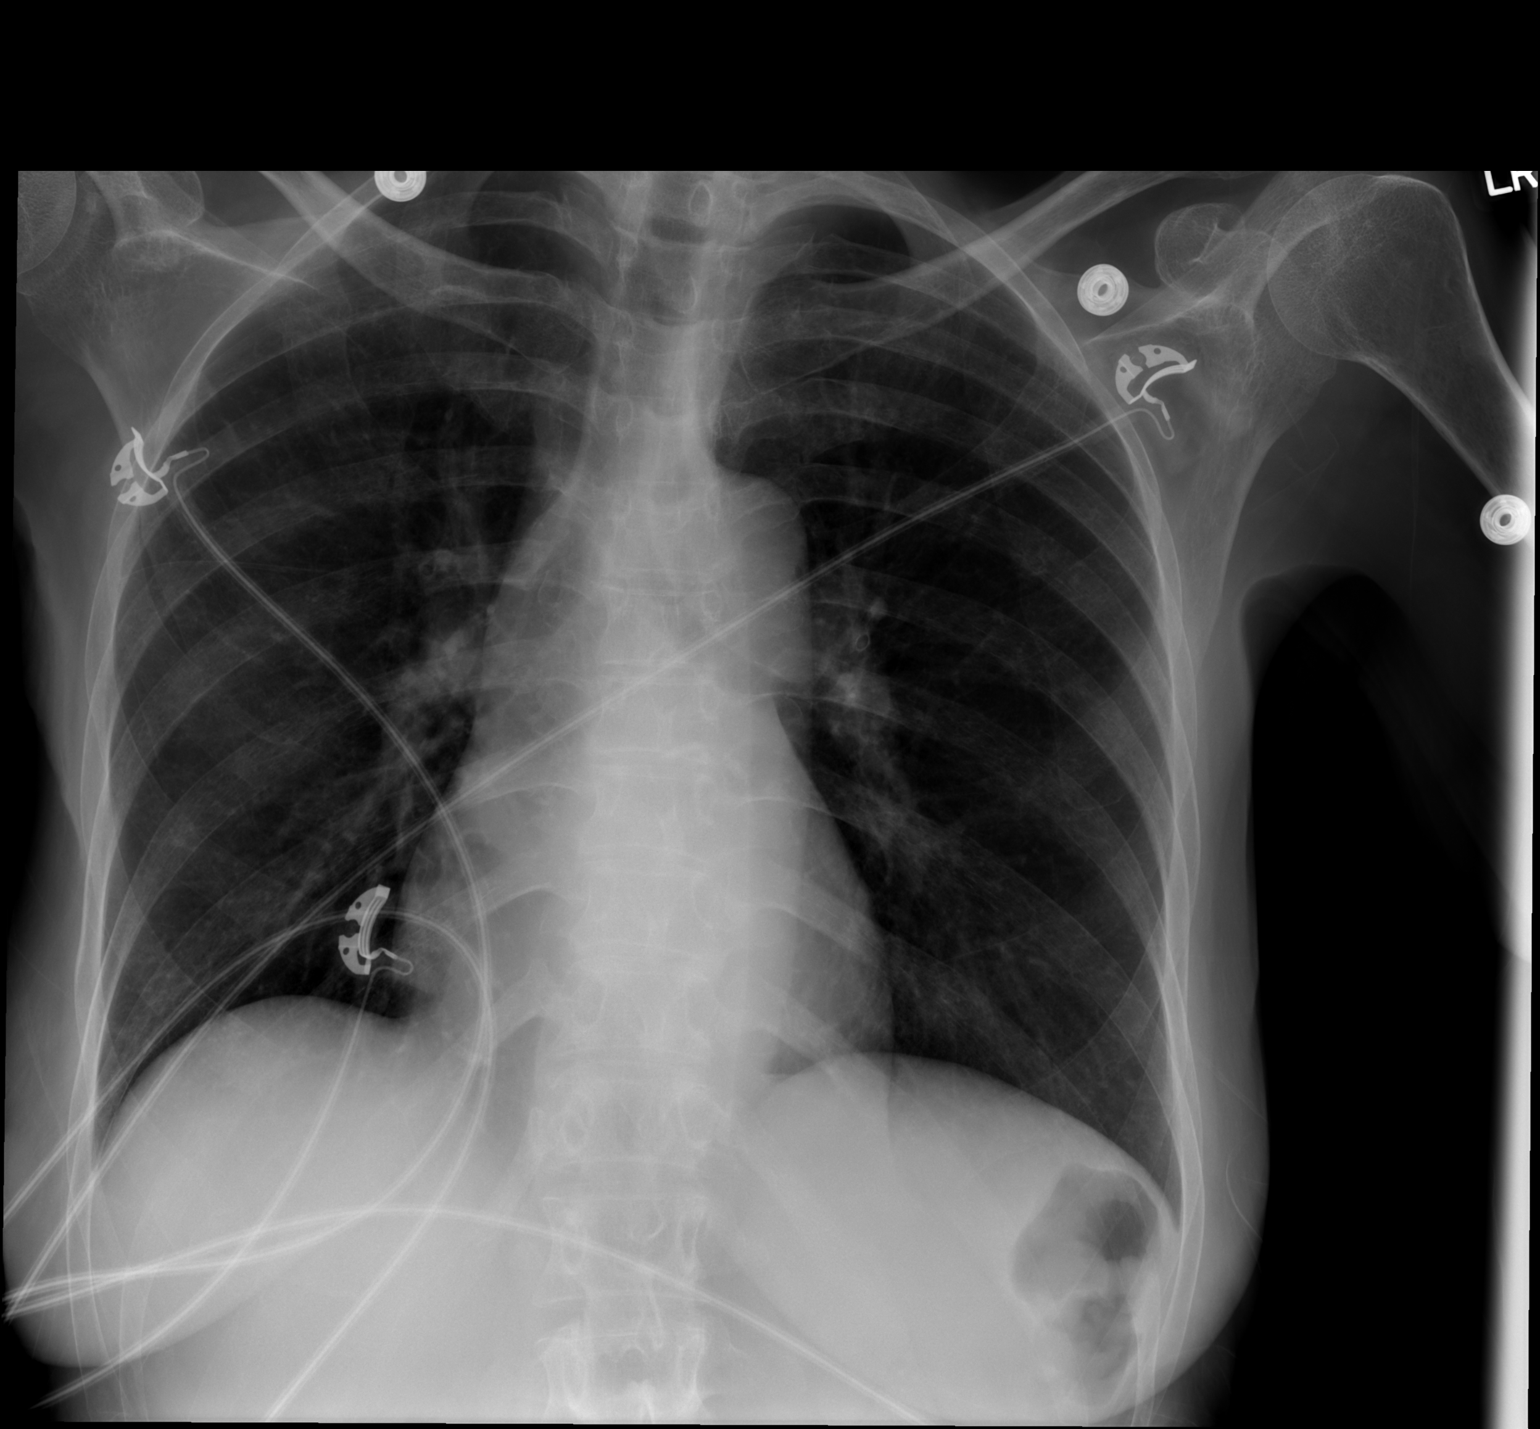

[2 of 2 positions shown; findings below may reference images not displayed]

FINDINGS: Normal heart size, mediastinal contours, and pulmonary vascularity.

Peribronchial thickening and minimal hyperinflation consistent with
history of asthma.

No acute infiltrate, pleural effusion or pneumothorax.

Diffuse osseous demineralization.
IMPRESSION: Peribronchial thickening and hyperinflation consistent with history
of asthma.

No acute infiltrate.

## 2016-06-28 MED ORDER — HYDROMORPHONE HCL 2 MG PO TABS
1.0000 mg | ORAL_TABLET | ORAL | Status: DC | PRN
  Administered 2016-06-28 – 2016-07-01 (×11): 1 mg via ORAL
  Filled 2016-06-28 (×12): qty 1

## 2016-06-28 MED ORDER — VITAMIN B-1 100 MG PO TABS
100.0000 mg | ORAL_TABLET | Freq: Every day | ORAL | Status: DC
  Administered 2016-06-29 – 2016-07-01 (×2): 100 mg via ORAL
  Filled 2016-06-28 (×2): qty 1

## 2016-06-28 MED ORDER — OXYCODONE-ACETAMINOPHEN 5-325 MG PO TABS
1.0000 | ORAL_TABLET | ORAL | Status: DC | PRN
  Administered 2016-06-28 – 2016-07-01 (×7): 1 via ORAL
  Filled 2016-06-28 (×7): qty 1

## 2016-06-28 MED ORDER — DIPHENOXYLATE-ATROPINE 2.5-0.025 MG PO TABS
1.0000 | ORAL_TABLET | Freq: Four times a day (QID) | ORAL | Status: DC | PRN
  Administered 2016-06-28 – 2016-07-01 (×4): 1 via ORAL
  Filled 2016-06-28 (×4): qty 1

## 2016-06-28 MED ORDER — FOLIC ACID 1 MG PO TABS
1.0000 mg | ORAL_TABLET | Freq: Every day | ORAL | Status: DC
  Administered 2016-06-28 – 2016-07-01 (×3): 1 mg via ORAL
  Filled 2016-06-28 (×4): qty 1

## 2016-06-28 MED ORDER — HYDROMORPHONE HCL 1 MG/ML IJ SOLN
1.0000 mg | Freq: Once | INTRAMUSCULAR | Status: AC
  Administered 2016-06-28: 1 mg via INTRAMUSCULAR
  Filled 2016-06-28: qty 1

## 2016-06-28 NOTE — Progress Notes (Signed)
Spoke with pt concerning discharge needs. [facility]. No to SNF."  Referral given to [facility].

## 2016-06-28 NOTE — Consult Note (Signed)
   [facility] Inpatient Consult   06/28/2016  [name] 11/06/1960 [id number]    Patient screened for [facility] Care Management program for multiple hospitalizations and referral received from inpatient RNCM. Discussed patient. Chart reviewed. Went to bedside to speak with patient about [facility] Care Management program. Family not present at the time. Ms. Snoke currently confused. Spoke with nursing as well. Noted [facility] Care Management has attempted to reach Ms. Dishaw in the past but was unsuccessful. Please see chart review then notes for patient outreach details. Appears that the son is the primary contact person. Will follow up with son to discuss potential [facility] Care Management services at later time.   Raiford Noble, MSN-Ed, RN,BSN [facility] Liaison [phone]

## 2016-06-28 NOTE — Consult Note (Signed)
   [facility] Inpatient Consult   06/28/2016  STELLAROSE YAPP 11-Oct-1960 [id number]    [facility] Care Management follow up. Spoke with inpatient RNCM who indicates Ms. Haapala in now alert and oriented. Asked that writer go back to speak with patient about [facility] Care Management program. Micah Flesher to bedside to speak with Ms. Chow. She is currently alert and oriented x4. Explained [facility] Care Management and she is agreeable. Written consent obtained.  Mentioned to Ms. Feeny that [facility] Care Management has made several attempts to reach her in the recent past to engage for services.    Patient states she lives with her son Fayrene Fearing and his girlfriend. States she has personal care services as well. Denies having any trouble with transportation or with obtaining medications. Confirms her Primary Care MD is Dr. Pete Glatter. Confirmed best contact number as [phone]. Emphasized the difference between home health services and [facility] Care Management. Discussed that [facility] Care Management will not interfere or replace services provided by home health or personal care services. Inpatient RNCM confirms that Ms. Agosto will have [facility] at discharge. [facility] Care Management packet including 24-hr nurse line magnet and contact information provided. Ms. Semelsberger expresses appreciation of visit. Made inpatient RNCM aware of the above.  Raiford Noble, MSN-Ed, RN,BSN [facility] Liaison [phone]

## 2016-06-28 NOTE — Progress Notes (Signed)
Speech Language Pathology Treatment: Dysphagia  Patient Details Name: [name] MRN: [id number] DOB: 07-26-1961 Today's Date: 06/28/2016 Time: 5449-2010 SLP Time Calculation (min) (ACUTE ONLY): 9 min  Assessment / Plan / Recommendation Clinical Impression  Pt today much improved, talkative and following some directions.  She is not perseverative as noted on Tuesday and both speech/voice were clear/strong.  Pt willing to consume Boost Breeze only - observed her drinking Breeze via straw with timely swallow and no indicatin of airway compromise. Pt declined to consume solids but suspect mastication ability to be normal evidenced by intact speech.  Pt consumed po slowly during today's intake without cues.  Will upgrade diet to Regular/thin per pt request, educated pt to general aspiration precautions.   No SlP follow up indicated as swallow ability intact and cognitive status improved.  Thanks.    HPI HPI: [name] Lupinacci Burnsis a 55 y.o.femalewith history of PE, DVT, hypothyroidism, asthma, GERD, chronic pain, diabetes mellitus brought to the ER after patient was found to be increasingly confused, acute encephalopathy.  CT of the head is unremarkable. Chest x-ray and UA does not show any infective source.      SLP Plan  All goals met;Discharge SLP treatment due to (comment) (pt at functional level of swallow)     Recommendations  Diet recommendations: Regular;Thin liquid Liquids provided via: Cup;Straw Medication Administration: Whole meds with liquid Compensations: Slow rate;Small sips/bites;Minimize environmental distractions Postural Changes and/or Swallow Maneuvers: Seated upright 90 degrees                Oral Care Recommendations: Oral care BID Follow up Recommendations: None Plan: All goals met;Discharge SLP treatment due to (comment) (pt at functional level of swallow)       [place], [place] [facility] SLP [phone]

## 2016-06-28 NOTE — Progress Notes (Signed)
PROGRESS NOTE  [name]  ZOX:[id number] DOB: November 24, 1960 DOA: 06/25/2016  PCP: Georgann Housekeeper, MD  Brief Narrative:  55 y.o. 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  Notes from previous admissions document that she has tried to manipulate staff by refusing discharge to home and demanding SNF, then ultimately refusing SNF.  She has a history of refusing procedures and labs.  She is on multiple sedating medications prescribed by multiple doctors.  She was brought to the ER with confusion.  In the ER she complained of pain on touching and was intermittently oriented to name and place but not at other times.  CT of the head is unremarkable. Chest x-ray and UA were unremarkable. Patient's lactate level initially was normal but repeat was elevated. Patient was admitted for acute encephalopathy.    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   - continue to hold all medications including effexor  - psych following, appreciate assistance, will continue to follow up on recommendations  - PT/OT/SLP were unable to work with patient today due to her refusal - pt actually looks better this AM, more alert and pleasant, following commands appropriately this AM   Dehydration  - with mildly elevated lactic acid.  No evidence of underlying infection - pt has refused blood work this AM   PE and LLE DVT  - which required catheter lysis 12/2015  - changed to home Eliquis as pt refusing IV lines  Colitis  - with hx of admissions for both diarrhea and constipation per d/c  summaries  Hypothyroidism - resume synthroid   Anemia of chronic disease - hemoglobin near baseline - no blood work done due to pt refusal   Underweight - Regular diet with supplements - Supplements per Nutrition, appreciate assistance - Body mass index is 14.27 kg/m.  Stage 2 pressure ulcer -  Pressure dressing - WOC consulted   DVT prophylaxis:  Eliquis  Code Status:  full Family Communication:  Plan to call son this afternoon.  Disposition Plan:  Pending improvement in mentation to HOME since patient has repeatedly refused discharge to SNF   Consultants:   psychiatry  Procedures:   none   Antimicrobials:   none    Subjective: No events overnight.   Objective: Vitals:   06/26/16 2042 06/27/16 2144 06/28/16 0243 06/28/16 0540  BP: 108/60 119/90  (!) 131/100  Pulse: 97 (!) 136 91 72  Resp: 18 18  18   Temp:  99.5 F (37.5 C)  98.6 F (37 C)  TempSrc: Oral Oral  Oral  SpO2:  96%  99%  Weight:      Height:       No intake or output data in the 24 hours ending 06/28/16 1221 Filed Weights   06/25/16 1826 06/26/16 0615  Weight: 45.4 kg (100 lb) 40.1 kg (88 lb 6.5 oz)    Examination:  General exam:  Cachectic adult female.  No acute distress.  HEENT:  NCAT, MMM Respiratory system: Clear to auscultation bilaterally Cardiovascular system: Regular rate and rhythm, normal S1/S2. No murmurs, rubs, gallops or clicks.  Warm extremities  Data Reviewed: I have personally reviewed following labs and imaging studies  CBC:  Recent Labs Lab 06/25/16 2124  WBC 7.4  NEUTROABS 4.4  HGB 11.1*  HCT 31.8*  MCV 97.2  PLT 161   Basic Metabolic Panel:  Recent Labs Lab 06/25/16 2124  NA 143  K 3.8  CL 109  CO2 25  GLUCOSE 75  BUN <5*  CREATININE 0.61  CALCIUM 7.2*   Liver Function Tests:  Recent Labs Lab 06/25/16 2124  AST 39  ALT 10*  ALKPHOS 106  BILITOT 1.3*  PROT 4.8*  ALBUMIN 2.0*    Recent Labs Lab 06/25/16 2124  LIPASE 12    CBG:  Recent Labs Lab 06/25/16 2107  GLUCAP 70   Urine analysis:    Component Value Date/Time   COLORURINE AMBER (A) 06/26/2016 0000   APPEARANCEUR CLEAR 06/26/2016 0000   LABSPEC 1.017 06/26/2016 0000   PHURINE 7.0 06/26/2016 0000   GLUCOSEU NEGATIVE 06/26/2016 0000   HGBUR NEGATIVE 06/26/2016 0000   BILIRUBINUR SMALL (A) 06/26/2016 0000   KETONESUR 15 (A) 06/26/2016 0000   PROTEINUR NEGATIVE 06/26/2016 0000   UROBILINOGEN 1.0 02/28/2015 1227   NITRITE NEGATIVE 06/26/2016 0000   LEUKOCYTESUR NEGATIVE 06/26/2016 0000    Recent Results (from the past 240 hour(s))  C difficile quick scan w PCR reflex     Status: None   Collection Time: 06/27/16  9:22 AM  Result Value Ref Range Status   C Diff antigen NEGATIVE NEGATIVE Final   C Diff toxin NEGATIVE NEGATIVE Final   C Diff interpretation No C. difficile detected.  Final      Radiology Studies: No results found.   Scheduled Meds: . apixaban  5 mg Oral BID  . chlorhexidine  15 mL Mouth Rinse BID  . folic acid  1 mg Intravenous Daily  . mouth rinse  15 mL Mouth Rinse q12n4p  . thiamine injection  100 mg Intravenous Daily   Continuous Infusions:     LOS: 2 days    Time spent: 30 min    Debbora PrestoMAGICK-Tranae Laramie, MD [facility] Pager [phone]  If 7PM-7AM, please contact night-coverage [email or web address] Password [email or web address] 06/28/2016, 12:21 PM

## 2016-06-29 MED ORDER — GABAPENTIN 100 MG PO CAPS
100.0000 mg | ORAL_CAPSULE | Freq: Once | ORAL | Status: AC
  Administered 2016-06-30: 100 mg via ORAL
  Filled 2016-06-29: qty 1

## 2016-06-29 NOTE — Progress Notes (Signed)
Refused labs again this am.

## 2016-06-29 NOTE — Progress Notes (Signed)
PT Cancellation Note  Patient Details Name: NASHELY BEATH MRN: [id number] DOB: 06-21-1961   Cancelled Treatment:     pt declined despite several attempts.  "I am not getting out of the bed, I hurt".  Will check back later as schedule permits.     Felecia Shelling  PTA [facility]  Acute  Rehab Pager      [phone]

## 2016-06-29 NOTE — Consult Note (Signed)
WOC Nurse wound consult note Reason for Consult:IAD with partial thickness tissue loss measuring 0.5cm round x 0.1cm Wound type:Moisture Pressure Ulcer POA: No Measurement:As described above Wound FAO:ZHYQbed:pink, moist Drainage (amount, consistency, odor) None Periwound:erythematous, macerated Dressing procedure/placement/frequency: I will provide Nursing with guidance via the orders for twice daily and PRN application of a moisture barrier ointment after gentle cleansing. Patient is to avoid the supine position while in bed, turning side to side. WOC nursing team will not follow, but will remain available to this patient, the nursing and medical teams.  Please re-consult if needed. Thanks, Ladona MowLaurie Jiovani Mccammon, MSN, RN, GNP, Hans EdenCWOCN, CWON-AP, FAAN  Pager# [phone]

## 2016-06-29 NOTE — Progress Notes (Signed)
PROGRESS NOTE  [name]  DZH:[id number] DOB: [birth year] DOA: 06/25/2016  PCP: Georgann Housekeeper, MD  Brief Narrative:  55 y.o. 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  Notes from previous admissions document that she has tried to manipulate staff by refusing discharge to home and demanding SNF, then ultimately refusing SNF.  She has a history of refusing procedures and labs.  She is on multiple sedating medications prescribed by multiple doctors.  She was brought to the ER with confusion.  In the ER she complained of pain on touching and was intermittently oriented to name and place but not at other times.  CT of the head is unremarkable. Chest x-ray and UA were unremarkable. Patient's lactate level initially was normal but repeat was elevated. Patient was admitted for acute encephalopathy.    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   - continue to hold all medications including effexor  - psych following, appreciate assistance, will continue to follow up on recommendations  - pt more somnolent this AM, easy to awake but doses of very quickly, did not eat much this AM  - will discuss with RN   Dehydration  - with mildly elevated lactic acid.  No evidence of underlying infection - pt has been refusing blood work and would like to repeat blood work prior to discharging pt - will attempt again in AM  PE and LLE DVT  - which required catheter lysis 12/2015  - changed to home Eliquis as pt refusing IV lines - would like to have CBC checked before  discharge and will attempt in AM as noted above  Colitis  - with hx of admissions for both diarrhea and constipation per d/c summaries - has not been eating much, will discuss with RN  Hypothyroidism - resume synthroid   Anemia of chronic disease - hemoglobin near baseline - no blood work done due to pt refusal, try to obtain CBC in AM  Underweight - Regular diet with supplements - Supplements per Nutrition, appreciate assistance - Body mass index is 14.27 kg/m.  Stage 2 pressure ulcer - sacral pressure ulcer stage II present on admission, 0.5 x 0.5 cm round  - partial thickness open wound with red and pink wound bed, no drainage - WOC consulted   DVT prophylaxis:  Eliquis  Code Status:  full Family Communication:  Plan to call son this afternoon.  Disposition Plan:  Pending improvement in mentation to HOME since patient has repeatedly refused discharge to SNF   Consultants:   Psychiatry  WOC  Procedures:   None    Antimicrobials:   None      Subjective: No events overnight.   Objective: Vitals:   06/28/16 0540 06/28/16 1403 06/28/16 2101 06/29/16 0427  BP: (!) 131/100 118/80 119/81 112/86  Pulse: 72 92 86 84  Resp: 18 18 18 18   Temp: 98.6 F (37 C) 98.7 F (37.1 C) 99.2 F (37.3 C) 98.9 F (37.2 C)  TempSrc: Oral Oral Oral Oral  SpO2: 99% 99% 100% 100%  Weight:      Height:        Intake/Output Summary (Last 24 hours) at 06/29/16 1253  Last data filed at 06/28/16 2230  Gross per 24 hour  Intake              360 ml  Output               50 ml  Net              310 ml   Filed Weights   06/25/16 1826 06/26/16 0615  Weight: 45.4 kg (100 lb) 40.1 kg (88 lb 6.5 oz)    Examination:  General exam:  Cachectic adult female.  No acute distress. More somnolent this AM.  HEENT:  NCAT, MMM Respiratory system: Clear to auscultation bilaterally Cardiovascular system: Regular rate and rhythm, normal S1/S2. No murmurs, rubs, gallops or clicks.  Warm  extremities  Data Reviewed: I have personally reviewed following labs and imaging studies  CBC:  Recent Labs Lab 06/25/16 2124  WBC 7.4  NEUTROABS 4.4  HGB 11.1*  HCT 31.8*  MCV 97.2  PLT 161   Basic Metabolic Panel:  Recent Labs Lab 06/25/16 2124  NA 143  K 3.8  CL 109  CO2 25  GLUCOSE 75  BUN <5*  CREATININE 0.61  CALCIUM 7.2*   Liver Function Tests:  Recent Labs Lab 06/25/16 2124  AST 39  ALT 10*  ALKPHOS 106  BILITOT 1.3*  PROT 4.8*  ALBUMIN 2.0*    Recent Labs Lab 06/25/16 2124  LIPASE 12   CBG:  Recent Labs Lab 06/25/16 2107  GLUCAP 70   Urine analysis:    Component Value Date/Time   COLORURINE AMBER (A) 06/26/2016 0000   APPEARANCEUR CLEAR 06/26/2016 0000   LABSPEC 1.017 06/26/2016 0000   PHURINE 7.0 06/26/2016 0000   GLUCOSEU NEGATIVE 06/26/2016 0000   HGBUR NEGATIVE 06/26/2016 0000   BILIRUBINUR SMALL (A) 06/26/2016 0000   KETONESUR 15 (A) 06/26/2016 0000   PROTEINUR NEGATIVE 06/26/2016 0000   UROBILINOGEN 1.0 02/28/2015 1227   NITRITE NEGATIVE 06/26/2016 0000   LEUKOCYTESUR NEGATIVE 06/26/2016 0000    Recent Results (from the past 240 hour(s))  C difficile quick scan w PCR reflex     Status: None   Collection Time: 06/27/16  9:22 AM  Result Value Ref Range Status   C Diff antigen NEGATIVE NEGATIVE Final   C Diff toxin NEGATIVE NEGATIVE Final   C Diff interpretation No C. difficile detected.  Final      Radiology Studies: No results found.   Scheduled Meds: . apixaban  5 mg Oral BID  . chlorhexidine  15 mL Mouth Rinse BID  . folic acid  1 mg Oral Daily  . mouth rinse  15 mL Mouth Rinse q12n4p  . thiamine  100 mg Oral Daily   Continuous Infusions:     LOS: 3 days    Time spent: 30 min    Debbora PrestoMAGICK-Lorain Fettes, MD [facility] Pager [phone]  If 7PM-7AM, please contact night-coverage [email or web address] Password [email or web address] 06/29/2016, 12:53 PM

## 2016-06-30 DIAGNOSIS — Z803 Family history of malignant neoplasm of breast: Secondary | ICD-10-CM

## 2016-06-30 DIAGNOSIS — Z79899 Other long term (current) drug therapy: Secondary | ICD-10-CM

## 2016-06-30 DIAGNOSIS — R451 Restlessness and agitation: Secondary | ICD-10-CM

## 2016-06-30 DIAGNOSIS — F329 Major depressive disorder, single episode, unspecified: Secondary | ICD-10-CM

## 2016-06-30 DIAGNOSIS — Z833 Family history of diabetes mellitus: Secondary | ICD-10-CM

## 2016-06-30 DIAGNOSIS — Z8249 Family history of ischemic heart disease and other diseases of the circulatory system: Secondary | ICD-10-CM

## 2016-06-30 DIAGNOSIS — F17210 Nicotine dependence, cigarettes, uncomplicated: Secondary | ICD-10-CM

## 2016-06-30 LAB — BASIC METABOLIC PANEL
Anion gap: 10 (ref 5–15)
CALCIUM: 7 mg/dL — AB (ref 8.9–10.3)
CHLORIDE: 113 mmol/L — AB (ref 101–111)
CO2: 22 mmol/L (ref 22–32)
CREATININE: 0.64 mg/dL (ref 0.44–1.00)
Glucose, Bld: 61 mg/dL — ABNORMAL LOW (ref 65–99)
Potassium: 2.9 mmol/L — ABNORMAL LOW (ref 3.5–5.1)
SODIUM: 145 mmol/L (ref 135–145)

## 2016-06-30 LAB — CBC
HCT: 29.1 % — ABNORMAL LOW (ref 36.0–46.0)
Hemoglobin: 9.9 g/dL — ABNORMAL LOW (ref 12.0–15.0)
MCH: 32.8 pg (ref 26.0–34.0)
MCHC: 34 g/dL (ref 30.0–36.0)
MCV: 96.4 fL (ref 78.0–100.0)
PLATELETS: 138 10*3/uL — AB (ref 150–400)
RBC: 3.02 MIL/uL — ABNORMAL LOW (ref 3.87–5.11)
RDW: 14.5 % (ref 11.5–15.5)
WBC: 6.5 10*3/uL (ref 4.0–10.5)

## 2016-06-30 MED ORDER — VENLAFAXINE HCL ER 37.5 MG PO CP24
37.5000 mg | ORAL_CAPSULE | Freq: Every day | ORAL | Status: DC
  Administered 2016-07-01: 37.5 mg via ORAL
  Filled 2016-06-30 (×2): qty 1

## 2016-06-30 MED ORDER — GABAPENTIN 100 MG PO CAPS
100.0000 mg | ORAL_CAPSULE | Freq: Two times a day (BID) | ORAL | Status: DC
  Administered 2016-06-30 – 2016-07-01 (×3): 100 mg via ORAL
  Filled 2016-06-30 (×3): qty 1

## 2016-06-30 MED ORDER — HALOPERIDOL 2 MG PO TABS
2.0000 mg | ORAL_TABLET | Freq: Four times a day (QID) | ORAL | Status: DC | PRN
Start: 2016-06-30 — End: 2016-07-01
  Administered 2016-06-30 – 2016-07-01 (×4): 2 mg via ORAL
  Filled 2016-06-30 (×5): qty 1

## 2016-06-30 MED ORDER — TRAZODONE HCL 50 MG PO TABS
25.0000 mg | ORAL_TABLET | Freq: Every evening | ORAL | Status: DC | PRN
  Administered 2016-06-30: 25 mg via ORAL
  Filled 2016-06-30: qty 1

## 2016-06-30 MED ORDER — POTASSIUM CHLORIDE CRYS ER 20 MEQ PO TBCR
40.0000 meq | EXTENDED_RELEASE_TABLET | Freq: Two times a day (BID) | ORAL | Status: AC
  Administered 2016-06-30 (×2): 40 meq via ORAL
  Filled 2016-06-30 (×2): qty 2

## 2016-06-30 MED ORDER — HALOPERIDOL LACTATE 5 MG/ML IJ SOLN: 2.0000 mg | Freq: Four times a day (QID) | INTRAMUSCULAR | Status: DC | PRN

## 2016-06-30 MED ORDER — BOOST / RESOURCE BREEZE PO LIQD
1.0000 | Freq: Two times a day (BID) | ORAL | Status: DC
  Administered 2016-06-30 – 2016-07-01 (×3): 1 via ORAL

## 2016-06-30 NOTE — Progress Notes (Signed)
Pt served IVC paper by [employer] officer per son approval and MD order. Pt continues to have irradic thinking at times and poor judgment. SRP, RN

## 2016-06-30 NOTE — Progress Notes (Signed)
Pt calmer this afternoon, continues to be unsafe and agitated at times. Sitter at bedside will cont to monitor. SRP, RN

## 2016-06-30 NOTE — Consult Note (Signed)
[facility] Consult   Reason for Consult:  Agitation and refusing treatment Referring Physician:  Dr. Doyle Askew Patient Identification: [name] MRN:  [id number] Principal Diagnosis: Delirium Diagnosis:   Patient Active Problem List   Diagnosis Date Noted  . Delirium [R41.0] 06/26/2016  . Pressure injury of skin [L89.90] 06/26/2016  . Abdominal pain, generalized [R10.84]   . Abdominal pain [R10.9]   . AKI (acute kidney injury) ([place]) [N17.9]   . Sepsis ([place]) [A41.9]   . UTI (lower urinary tract infection) [N39.0] 02/28/2016  . 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 Excoriated rash-medial thighs [R21] 02/28/2016  . DVT (deep venous thrombosis), unspecified laterality [I82.409] 01/29/2016  . Bilateral tibial fractures [S82.201A, S82.202A]   . Hypothyroidism [E03.9]   . GERD (gastroesophageal reflux disease) [K21.9]   . HLD (hyperlipidemia) [E78.5]   . Enteritis due to Clostridium difficile [A04.7] 03/23/2015  . Thrombocytopenia ([place]) [D69.6] 03/21/2015  . Weakness of back [M62.81] 03/21/2015  . Chronic pain [G89.29] 02/28/2015  . Severe protein-calorie malnutrition ([place]) [E43] 12/30/2014  . Hepatic steatosis [K76.0] 12/29/2014  . Chronic diarrhea of unknown origin [K52.9] 12/29/2014  . Elevated lactic acid level [E87.2]   . Dehydration [E86.0] 07/26/2014  . Arterial hypotension [I95.9] 07/26/2014  . History of pulmonary embolus (PE) [Z86.711] 05/24/2014  . Physical deconditioning [R53.81] 05/14/2014  . Nicotine abuse [F19.10] 05/08/2014  . Normocytic anemia [D64.9] 10/15/2012  . Metabolic acidosis [W43.1] 10/14/2012  . Diabetes mellitus with complication ([place]) [V40.0] 05/12/2007  . Anxiety state [F41.1] 05/12/2007  . Depression [F32.9] 05/12/2007  . Peripheral neuropathy ([place]) [G62.9] 05/12/2007  . HTN (hypertension) [I10] 05/12/2007  . ALLERGIC RHINITIS [J30.9] 05/12/2007  . Asthma [J45.909] 05/12/2007  . PANCREATITIS, HX OF [Z87.19] 05/12/2007     Total Time spent with patient: 30 minutes  Subjective:   [name] is a 55 y.o. female patient admitted with worsening confusion and generalized aches, weakness.  HPI: Patient has been suffering with multiple medical problems and depression presented with worsening confusion, generalized weakness, poor appetite, loss of weight and unable to care for her self. Reviewed psych consulted completed by Dr. Parke Poisson few day ago. Patient has some improvement with her mentation and speech. She has an episode of agitation and removed her IV line and demanding to be discharged when not medically stable to be discharged. Patient states that she felt the provider has given negative interaction and she given a attitude. Staff RN reported that patient was given haldol and pain medication to calm down. Patient states that she has diabetic neuropathy, diarrhea and asks medication like Neurontin and nutrition suppliment like "resource". She continue to be confused, not able to remember most of the treatment needs but has strong opinions. She agree to stay in hospital to get her medical treatment and working with Staff RN, Sophia who she is willing to work regarding treatment needs while in hospital. Patient has fair orientation, concentration but has difficulties with memory and frequent agitation. She is known to non compliant with medication treatment. She is willing to take her medication for depression which was provided by her PCP and has no history of psych admissions.   See Dr. Parke Poisson noted for more details: She is a 55 year old female, lives with son, presented to ED on 9/25. Son reported patient has been increasingly confused recently .  She has a history of chronic medical illnesses, as below, to include history of DVT, PE, Hypothyroidism, DM.  As per progress notes, she also  has  a history of  alcohol /polysubstance abuse, history of  prior admissions, and history of complicated disposition planning due to  requesting but then refusing SNF placement . At this time work up has been complicated by patient refusing  bloodwork .  Her admission UDS is positive for opiates only. No admission BAL reported . Patient's son reports that patient has declined significantly over the last 2-3 weeks, with worsening confusion, without any readily identifiable reason. Son states he does not think patient has been drinking heavily or regularly recently . Patient denies any recent alcohol use or any drug abuse . At this time patient is awake, minimally cooperative, face covered by bed sheet for most of session, answering only some questions .She does seem to understand what  is being asked and answers , although short and sporadic, are appropriate to the question. She perseverates , and often answers questions  by repetitively using monosyllables such as " no, no, no, no, no".  She appears confused and did not or could not answer any questions about her orientation .   Past Psychiatric History: Patient denies psychiatric history at present . As per chart , was being prescribed Effexor XR 25 mgrs BID, Trazodone 25 mgrs QHS.   Risk to Self: Is patient at risk for suicide?: No Risk to Others:   Prior Inpatient Therapy:   Prior Outpatient Therapy:    Past Medical History:  Past Medical History:  Diagnosis Date  . Anxiety   . Arthritis    "a little bit; all over" (05/01/2016)  . Asthma   . Asthma   . C. difficile colitis   . Chronic lower back pain   . Crohn disease ([place])   . Diabetes mellitus without complication ([place])   . DVT (deep venous thrombosis) ([place]) ~ 03/2016   LLE  . Elevated lactic acid level   . GERD (gastroesophageal reflux disease)   . Glaucoma   . HLD (hyperlipidemia)   . Hypertension   . Hypothyroidism   . Neuropathy ([place])   . Pancreatitis   . Pulmonary embolus ([place]) 05/24/2014  . Thrombocytopenia ([place])   . Tobacco abuse     Past Surgical History:  Procedure Laterality Date  . CESAREAN  SECTION  1996  . COLONOSCOPY N/A 03/02/2015   Procedure: COLONOSCOPY;  Surgeon: Ronald Lobo, MD;  Location: [facility] ENDOSCOPY;  Service: Endoscopy;  Laterality: N/A;  . ESOPHAGOGASTRODUODENOSCOPY N/A 05/21/2014   Procedure: ESOPHAGOGASTRODUODENOSCOPY (EGD);  Surgeon: Jeryl Columbia, MD;  Location: [facility] ENDOSCOPY;  Service: Endoscopy;  Laterality: N/A;  . ESOPHAGOGASTRODUODENOSCOPY N/A 03/02/2015   Procedure: ESOPHAGOGASTRODUODENOSCOPY (EGD);  Surgeon: Ronald Lobo, MD;  Location: [facility] ENDOSCOPY;  Service: Endoscopy;  Laterality: N/A;   Family History:  Family History  Problem Relation Age of Onset  . Breast cancer Mother   . Heart disease Mother 99    CABG  . Diabetes Mother   . Heart disease Father 29    CABG  . Diabetes Father    Family Psychiatric  History: non contributory   Social History:  Lives with adult son, on disability .  History  Alcohol Use No     History  Drug Use No    Social History   Social History  . Marital status: Divorced    Spouse name: N/A  . Number of children: N/A  . Years of education: N/A   Social History Main Topics  . Smoking status: Current Every Day Smoker    Packs/day: 0.25    Years: 37.00  Types: Cigarettes  . Smokeless tobacco: Never Used  . Alcohol use No  . Drug use: No  . Sexual activity: No   Other Topics Concern  . None   Social History Narrative  . None   Additional Social History:    Allergies:   Allergies  Allergen Reactions  . Iohexol Other (See Comments)    "severe burning" Patient has received Contrast in 2005 with 13 hour pre-medication, and had no reaction at that time  . Spiriva Handihaler [Tiotropium Bromide Monohydrate] Nausea And Vomiting  . Iodinated Diagnostic Agents Other (See Comments)  . Ativan [Lorazepam] Other (See Comments)    hallucinations  . Penicillins Hives    Has had cephalosporins    Labs:  Results for orders placed or performed during the hospital encounter of 06/25/16 (from the past 48  hour(s))  CBC     Status: Abnormal   Collection Time: 06/30/16  5:09 AM  Result Value Ref Range   WBC 6.5 4.0 - 10.5 K/uL   RBC 3.02 (L) 3.87 - 5.11 MIL/uL   Hemoglobin 9.9 (L) 12.0 - 15.0 g/dL   HCT 29.1 (L) 36.0 - 46.0 %   MCV 96.4 78.0 - 100.0 fL   MCH 32.8 26.0 - 34.0 pg   MCHC 34.0 30.0 - 36.0 g/dL   RDW 14.5 11.5 - 15.5 %   Platelets 138 (L) 150 - 400 K/uL  Basic metabolic panel     Status: Abnormal   Collection Time: 06/30/16  5:09 AM  Result Value Ref Range   Sodium 145 135 - 145 mmol/L   Potassium 2.9 (L) 3.5 - 5.1 mmol/L   Chloride 113 (H) 101 - 111 mmol/L   CO2 22 22 - 32 mmol/L   Glucose, Bld 61 (L) 65 - 99 mg/dL   BUN <5 (L) 6 - 20 mg/dL   Creatinine, Ser 0.64 0.44 - 1.00 mg/dL   Calcium 7.0 (L) 8.9 - 10.3 mg/dL   GFR calc non Af Amer >60 >60 mL/min   GFR calc Af Amer >60 >60 mL/min    Comment: (NOTE) The eGFR has been calculated using the CKD EPI equation. This calculation has not been validated in all clinical situations. eGFR's persistently <60 mL/min signify possible Chronic Kidney Disease.    Anion gap 10 5 - 15    Current Facility-Administered Medications  Medication Dose Route Frequency Provider Last Rate Last Dose  . apixaban (ELIQUIS) tablet 5 mg  5 mg Oral BID Theodis Blaze, MD   5 mg at 06/29/16 2127  . chlorhexidine (PERIDEX) 0.12 % solution 15 mL  15 mL Mouth Rinse BID Janece Canterbury, MD   15 mL at 06/28/16 2150  . diphenoxylate-atropine (LOMOTIL) 2.5-0.025 MG per tablet 1 tablet  1 tablet Oral QID PRN Theodis Blaze, MD   1 tablet at 06/30/16 0607  . folic acid (FOLVITE) tablet 1 mg  1 mg Oral Daily Theodis Blaze, MD   1 mg at 06/29/16 1125  . haloperidol (HALDOL) tablet 2 mg  2 mg Oral Q6H PRN Theodis Blaze, MD   2 mg at 06/30/16 1031  . HYDROmorphone (DILAUDID) tablet 1 mg  1 mg Oral Q4H PRN Gardiner Barefoot, NP   1 mg at 06/30/16 0607  . MEDLINE mouth rinse  15 mL Mouth Rinse q12n4p Janece Canterbury, MD   15 mL at 06/27/16 1342  .  oxyCODONE-acetaminophen (PERCOCET/ROXICET) 5-325 MG per tablet 1 tablet  1 tablet Oral Q4H PRN Rebecca Eaton M  Doyle Askew, MD   1 tablet at 06/30/16 1031  . thiamine (VITAMIN B-1) tablet 100 mg  100 mg Oral Daily Theodis Blaze, MD   100 mg at 06/29/16 1125    Musculoskeletal: Strength & Muscle Tone: within normal limits Gait & Station: gait not examined  Patient leans: N/A  Psychiatric Specialty Exam:  Physical Exam  ROS denies headache , denies chest pain, denies shortness of breath, denies vomiting   Blood pressure 114/80, pulse 86, temperature 98 F (36.7 C), temperature source Oral, resp. rate 18, height _0  (1.676 m), weight 40.1 kg (88 lb 6.5 oz), last menstrual period 10/01/2010, SpO2 100 %.Body mass index is 14.27 kg/m.  General Appearance: Fairly Groomed   Eye Contact:  Minimal  Speech:  Slow  Volume:  Normal  Mood:  denies depression, presents somewhat irritable   Affect:  irritable   Thought Process: slowed, perseverates   Orientation:  Other:  appears disoriented to time and place   Thought Content:  denies hallucinations, no delusions are expressed   Suicidal Thoughts:  No she denies suicidal/homicidal or violent ideations   Homicidal Thoughts:  No  Memory:  recent and remote currently poor   Judgement:  Impaired  Insight:  limited   Psychomotor Activity:  in bed, vaguely restless   Concentration:  Concentration: Poor and Attention Span: Poor  Recall:  Poor  Fund of Knowledge:  Poor  Language:  Poor  Akathisia:  Negative  Handed:  Right  AIMS (if indicated):     Assets:  Desire for Improvement Resilience  ADL's:  Impaired  Cognition:  Impaired,  Moderate  Sleep:      Assessment - Patient is a 55 year old female, who has chronic medical illnesses ,and who presents undernourished and chronically ill. She continues with worsening confusion, memory problems, poor insight and non compliance x about two weeks as per son. She is currently awake, alert, oriented and able to  participate in her nutrition and asking more treatment . She minimizes depression, denies psychotic symptoms, denies any suicidal ideations . She denies any recent alcohol consumption , and vitals are currently stable . At this time etiology of mental status changes is not clear, may be multi-factorial.    Treatment Plan Summary: Based on my evaluation patient meets criteria for IVC for medical treatment only if try to leave the hospital before stabilized with her hypokalemia and compliance with medication treatment.  Disposition: No evidence of imminent risk to self or others at present.   Patient does not meet criteria for psychiatric inpatient admission.   Will start at lower dose of Effexor XR which will help with her depression -presentation does not seem consistent with serotonin syndrome.  Will start Neurontin 400 mg PO BID/diabetic neuropathy Ask staff RN to give her potassium suppliment as ordered Will check labs again tomorrow, willing to stay as recommended Agree with Nutritional suppliment as needed  Would continue medical work up as appropriate for underlying causes of delirium.   Based on poor nutritional status and history of hypothyroidism with recent suspected medication non compliance,  would consider rechecking TSH and Thyroid Hormones, as well as B12, Vitamin D levels . Agree with Folate supplementation   Consider low dose Haldol ( ie : 1-2 mgrs Q 6 hours )as  PRN for agitation.  Would avoid Ativan PRNs , as report from Nursing staff is that this medication resulted in paradoxical disinhibition yesterday .   Ambrose Finland, MD 06/30/2016 11:29 AM

## 2016-06-30 NOTE — Progress Notes (Signed)
CSW contacted this am to assist with IVC papers. Pt had become agitated and belligerent, attempting to leave hospital. CSW completed IVC papers and faxed them to magistrate. CSW spoke with magistrate to confirm papers were received. Pt will be served. IVC papers are located in pt's shadow chart. Pt seen by psych this afternoon. In pt psych hospitalization has not been recommended.  CSW will continue to follow to assist with d/c planning.  Cori Razor LCSW [phone]

## 2016-06-30 NOTE — Progress Notes (Signed)
PROGRESS NOTE  [name]  ZOX:[id number] DOB: 02/02/1961 DOA: 06/25/2016  PCP: Georgann HousekeeperHUSAIN,KARRAR, MD  Brief Narrative:  55 y.o. 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  Notes from previous admissions document that she has tried to manipulate staff by refusing discharge to home and demanding SNF, then ultimately refusing SNF.  She has a history of refusing procedures and labs.  She is on multiple sedating medications prescribed by multiple doctors.  She was brought to the ER with confusion.  In the ER she complained of pain on touching and was intermittently oriented to name and place but not at other times.  CT of the head is unremarkable. Chest x-ray and UA were unremarkable. Patient's lactate level initially was normal but repeat was elevated. Patient was admitted for acute encephalopathy.    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   - psych re consulted, recommended initiating low dose Effexor - pt more agitated this AM, wants to leave but she was made aware that her potassium is rather low and she needs to have it supplemented inpatient and repeated blood work in am - d/w psych and recommendation is to continue to hospitalize until medically stable   Dehydration  - with mildly elevated lactic acid.  No evidence of underlying infection - no IV lines as pt pulled out   PE and LLE DVT  - which required catheter lysis 12/2015  - changed to home Eliquis as pt refusing IV lines - cbc in AM  Colitis  - with hx of admissions for both diarrhea and  constipation per d/c summaries - has not been eating much, will discuss with RN  Hypothyroidism - resume synthroid   Anemia of chronic disease - hemoglobin near baseline - no blood work done due to pt refusal, try to obtain CBC in AM  Underweight - Regular diet with supplements - Supplements per Nutrition, appreciate assistance - Body mass index is 14.27 kg/m.  Stage 2 pressure ulcer - sacral pressure ulcer stage II present on admission, 0.5 x 0.5 cm round  - partial thickness open wound with red and pink wound bed, no drainage - WOC consulted   DVT prophylaxis:  Eliquis  Code Status:  full Family Communication:  Plan to call son this afternoon.  Disposition Plan:  Home in 1-2 days    Consultants:   Psychiatry  WOC  Procedures:   None    Antimicrobials:   None      Subjective: No events overnight.   Objective: Vitals:   06/29/16 1300 06/29/16 2125 06/30/16 0547 06/30/16 1513  BP: 115/82 120/81 114/80 110/66  Pulse: 89 87 86 93  Resp: 18 16 18 18   Temp: 98.2 F (36.8 C) 98.9 F (37.2 C) 98 F (36.7 C) 98.4 F (36.9 C)  TempSrc: Oral Oral Oral Oral  SpO2: 100% 100% 100% 94%  Weight:      Height:        Intake/Output Summary (Last 24 hours) at 06/30/16 1630 Last data filed at 06/30/16 1541  Gross per 24 hour  Intake              360 ml  Output  0 ml  Net              360 ml   Filed Weights   06/25/16 1826 06/26/16 0615  Weight: 45.4 kg (100 lb) 40.1 kg (88 lb 6.5 oz)    Examination:  General exam:  Cachectic adult female.  More agitated this AM.  Refused exam   Data Reviewed: I have personally reviewed following labs and imaging studies  CBC:  Recent Labs Lab 06/25/16 2124 06/30/16 0509  WBC 7.4 6.5  NEUTROABS 4.4  --   HGB 11.1* 9.9*  HCT 31.8* 29.1*  MCV 97.2 96.4  PLT 161 138*   Basic Metabolic Panel:  Recent Labs Lab 06/25/16 2124 06/30/16 0509  NA 143 145  K 3.8 2.9*  CL 109 113*  CO2 25 22    GLUCOSE 75 61*  BUN <5* <5*  CREATININE 0.61 0.64  CALCIUM 7.2* 7.0*   Liver Function Tests:  Recent Labs Lab 06/25/16 2124  AST 39  ALT 10*  ALKPHOS 106  BILITOT 1.3*  PROT 4.8*  ALBUMIN 2.0*    Recent Labs Lab 06/25/16 2124  LIPASE 12   CBG:  Recent Labs Lab 06/25/16 2107  GLUCAP 70   Urine analysis:    Component Value Date/Time   COLORURINE AMBER (A) 06/26/2016 0000   APPEARANCEUR CLEAR 06/26/2016 0000   LABSPEC 1.017 06/26/2016 0000   PHURINE 7.0 06/26/2016 0000   GLUCOSEU NEGATIVE 06/26/2016 0000   HGBUR NEGATIVE 06/26/2016 0000   BILIRUBINUR SMALL (A) 06/26/2016 0000   KETONESUR 15 (A) 06/26/2016 0000   PROTEINUR NEGATIVE 06/26/2016 0000   UROBILINOGEN 1.0 02/28/2015 1227   NITRITE NEGATIVE 06/26/2016 0000   LEUKOCYTESUR NEGATIVE 06/26/2016 0000    Recent Results (from the past 240 hour(s))  C difficile quick scan w PCR reflex     Status: None   Collection Time: 06/27/16  9:22 AM  Result Value Ref Range Status   C Diff antigen NEGATIVE NEGATIVE Final   C Diff toxin NEGATIVE NEGATIVE Final   C Diff interpretation No C. difficile detected.  Final      Radiology Studies: No results found.   Scheduled Meds: . apixaban  5 mg Oral BID  . chlorhexidine  15 mL Mouth Rinse BID  . feeding supplement  1 Container Oral BID BM  . folic acid  1 mg Oral Daily  . gabapentin  100 mg Oral BID  . mouth rinse  15 mL Mouth Rinse q12n4p  . potassium chloride  40 mEq Oral BID  . thiamine  100 mg Oral Daily  . [START ON 07/01/2016] venlafaxine XR  37.5 mg Oral Q breakfast   Continuous Infusions:     LOS: 4 days    Time spent: 30 min    Debbora Presto, MD [facility] Pager [phone]  If 7PM-7AM, please contact night-coverage [email or web address] Password [email or web address] 06/30/2016, 4:30 PM

## 2016-06-30 NOTE — Progress Notes (Signed)
Pt agitated this am. States I am ready to leave . "You can't keep me here." Pt is irradic in her thinking/judgement unable to make safe decision. Pt unsteady on her feet, requires 1-2 persons assist. Pt refuses to take some of her am meds. " YOU need to get out of my room." Pt asked for her clothes. MD present and witness this behavior, security called. Pt agreed to take pain med and Haldol, review MAR for time. AC called, Child psychotherapist notified, pt son Milik called by care giver Bonita Quin and MD spoke with son. IVC process initiated per MD. Pt not able to make rational or safe decisions. Sitter called to help, Security stayed until sitter arrives. Will cont to monitor. SRP, RN

## 2016-07-01 LAB — BASIC METABOLIC PANEL
ANION GAP: 11 (ref 5–15)
CHLORIDE: 112 mmol/L — AB (ref 101–111)
CO2: 18 mmol/L — AB (ref 22–32)
Calcium: 7.1 mg/dL — ABNORMAL LOW (ref 8.9–10.3)
Creatinine, Ser: 0.61 mg/dL (ref 0.44–1.00)
GFR calc Af Amer: >60 mL/min (ref >60)
GLUCOSE: 98 mg/dL (ref 65–99)
Potassium: 3.8 mmol/L (ref 3.5–5.1)
Sodium: 141 mmol/L (ref 135–145)

## 2016-07-01 LAB — CBC
HEMATOCRIT: 31.5 % — AB (ref 36.0–46.0)
HEMOGLOBIN: 10.7 g/dL — AB (ref 12.0–15.0)
MCH: 33 pg (ref 26.0–34.0)
MCHC: 34 g/dL (ref 30.0–36.0)
MCV: 97.2 fL (ref 78.0–100.0)
PLATELETS: 149 10*3/uL — AB (ref 150–400)
RBC: 3.24 MIL/uL — AB (ref 3.87–5.11)
RDW: 15.1 % (ref 11.5–15.5)
WBC: 6.9 10*3/uL (ref 4.0–10.5)

## 2016-07-01 IMAGING — MR MR ABDOMEN WO/W CM MRCP
11 of 24 series · 19 of 48 positions shown · IV contrast (multihance)
Comparison: CT on 05/07/2014 and MR on 07/31/2011

CLINICAL DATA: Diffuse abdominal pain. Nausea and vomiting. Biliary
and pancreatic. Cirrhosis. Pancreatic ductal dilatation seen on
recent CT.

EXAM:
MRI ABDOMEN WITHOUT AND WITH CONTRAST (INCLUDING MRCP)
TECHNIQUE: Multiplanar multisequence MR imaging of the abdomen was performed
both before and after the administration of intravenous contrast.
Heavily T2-weighted images of the biliary and pancreatic ducts were
obtained, and three-dimensional MRCP images were rendered by post
processing.
CONTRAST:  8mL MULTIHANCE GADOBENATE DIMEGLUMINE 529 MG/ML IV SOLN

[Series 3: cor ssfse resp · coronal · 6.0mm · 0.78mm/px · 1 of 21 slices shown]
[im 1/21]
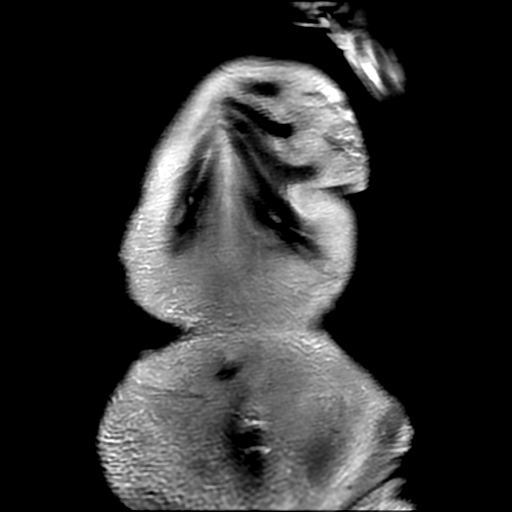

[Series 6: ax ssfse resp · axial · 6.0mm · 0.74mm/px · 1 of 39 slices shown]
[im 1/39]
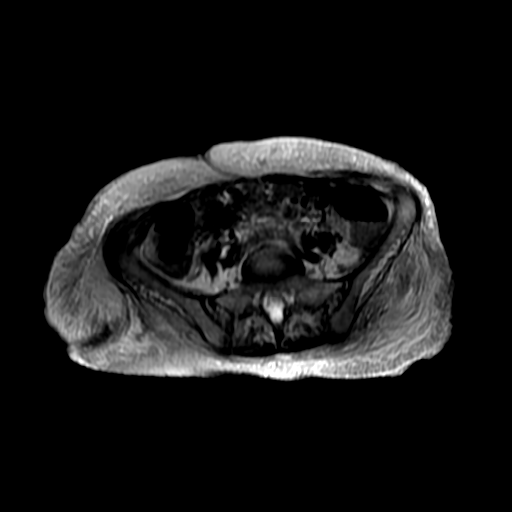

[Series 7: T2 fat-sat · axial · 5.5mm · 0.74mm/px · 1 of 28 slices shown]
[im 1/28]
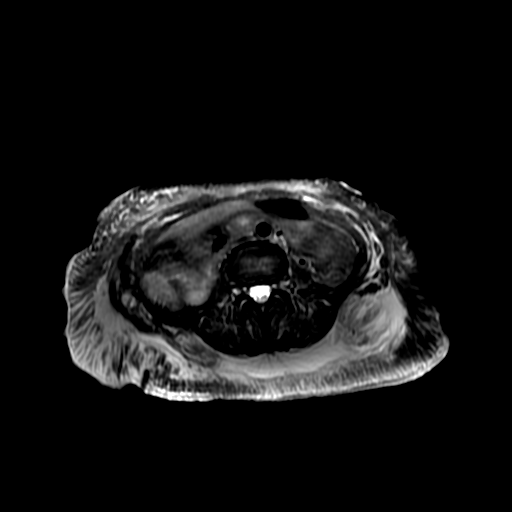

[Series 9: DWI b500 · axial · 8.0mm · 1.56mm/px · 1 of 41 slices shown]
[im 1/41]
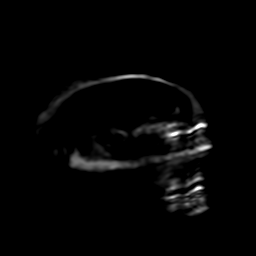

[Series 11: radial 2d thick · oblique · 40.0mm · 0.74mm/px · 1 of 12 slices shown]
[im 1/12]
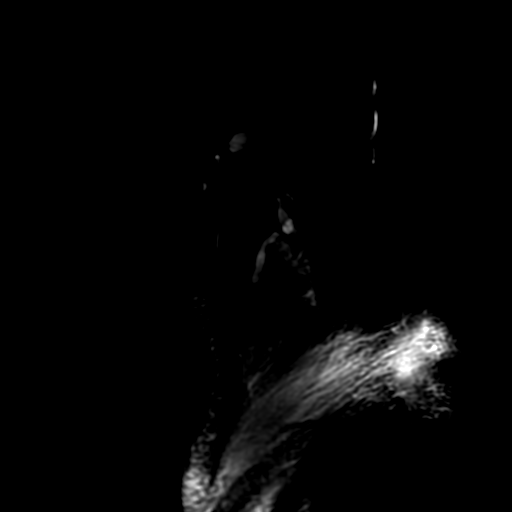

[Series 13: T1 dynamic · axial · non-contrast · 4.6mm · 0.70mm/px · z∈[+38,+217]mm · 2 of 80 slices shown (1 of 4)]
[im 1/80]
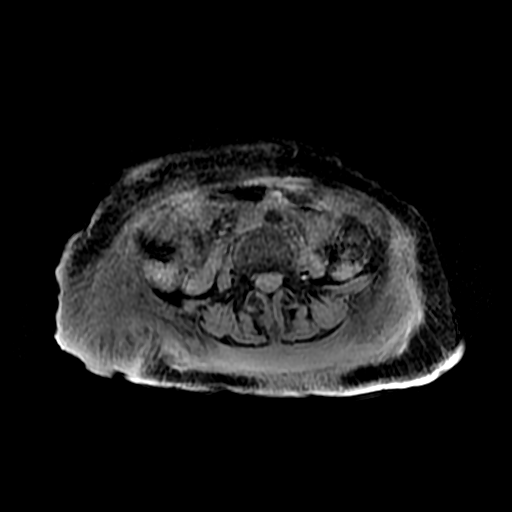
[im 80/80]
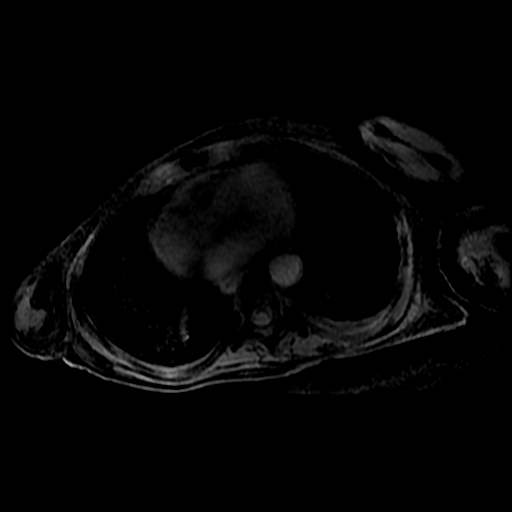

[Series 16: T1 dynamic post-contrast · coronal · 3.8mm · 0.78mm/px · 2 of 72 slices shown]
[im 1/72]
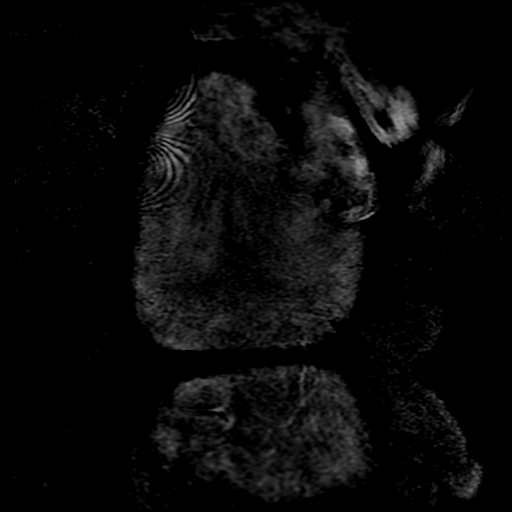
[im 72/72]
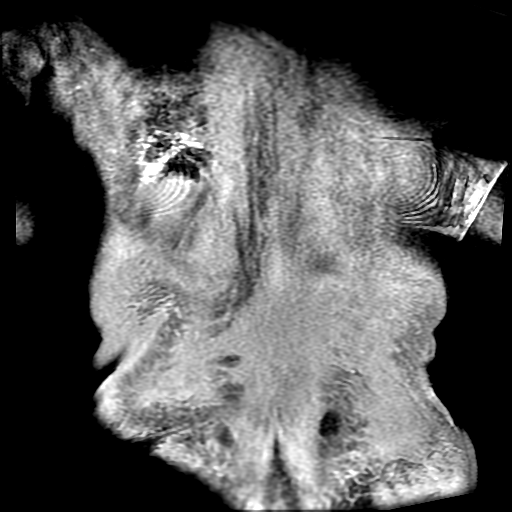

[Series 1003: processed images · axial · 0.6mm · 0.62mm/px · z∈[+78,+181]mm · 5 of 169 slices shown]
[im 1/169]
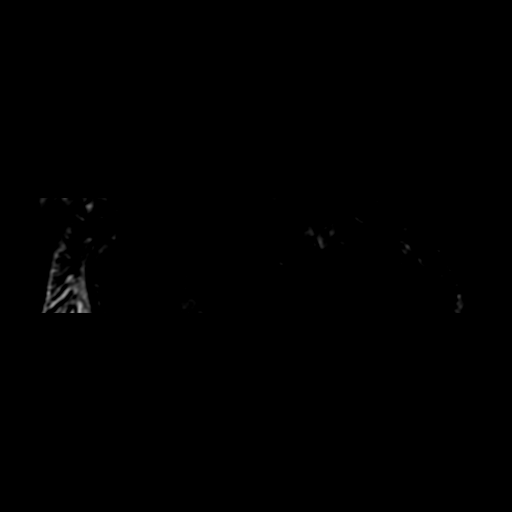
[im 43/169]
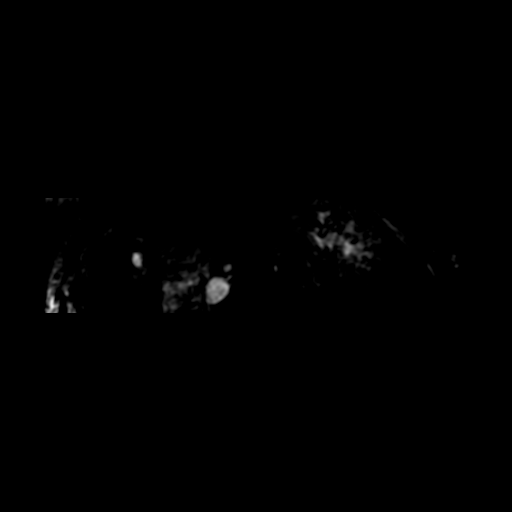
[im 85/169]
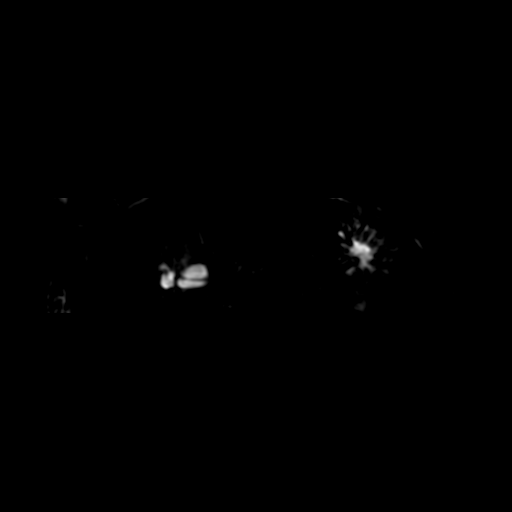
[im 127/169]
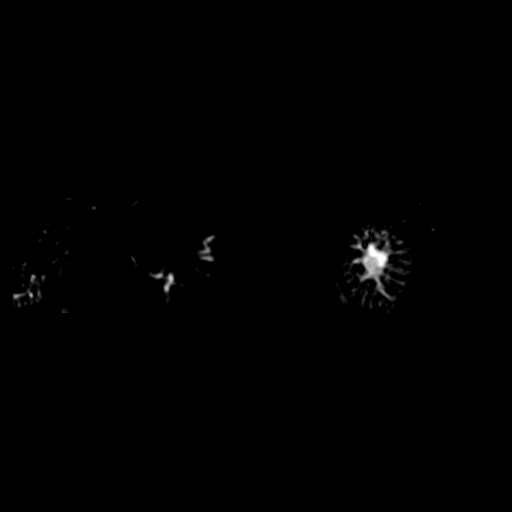
[im 169/169]
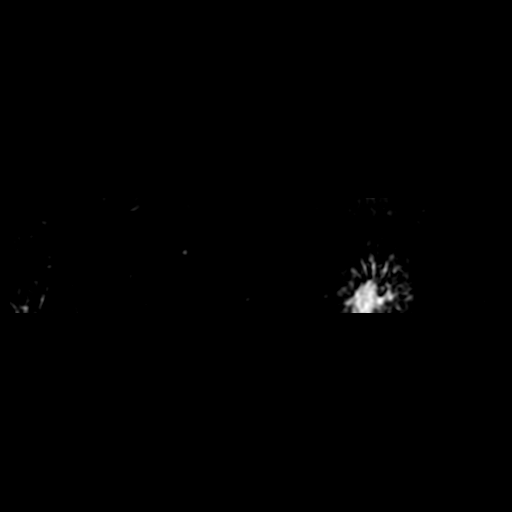

[Series 1201: T1 dynamic · axial · non-contrast · 4.6mm · 0.82mm/px · z∈[+31,+210]mm · 2 of 80 slices shown (2 of 4)]
[im 1/80]
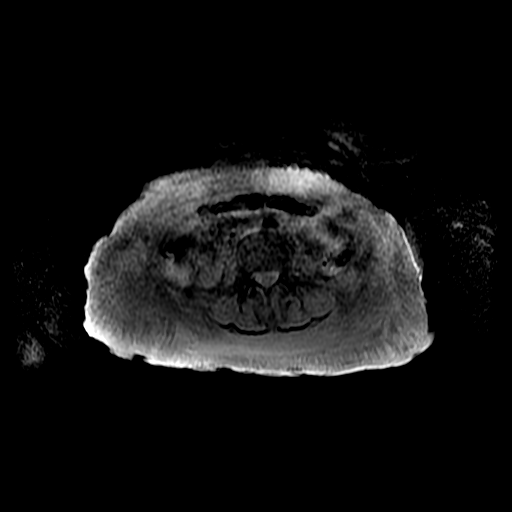
[im 80/80]
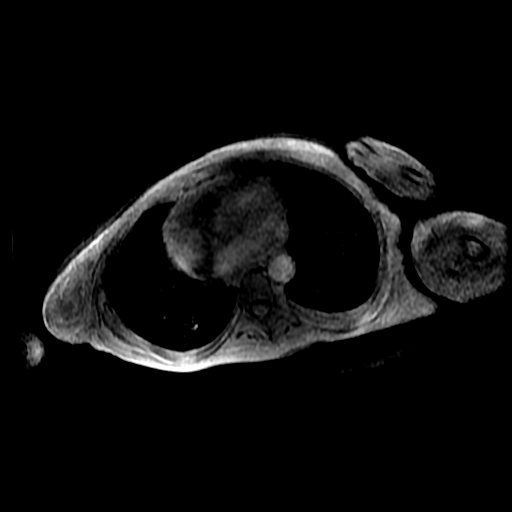

[Series 1202: T1 dynamic · axial · non-contrast · 4.6mm · 0.82mm/px · z∈[+31,+210]mm · 2 of 80 slices shown (3 of 4)]
[im 1/80]
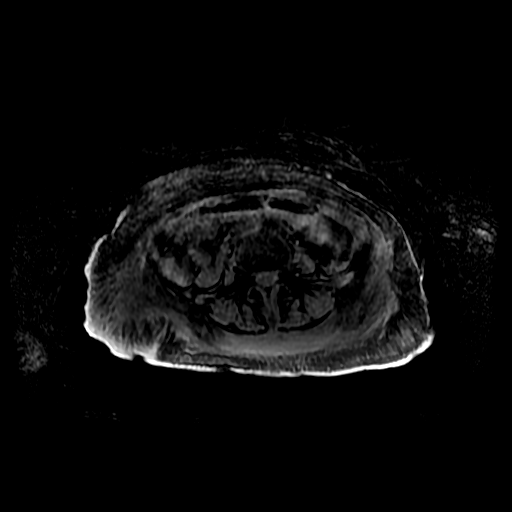
[im 80/80]
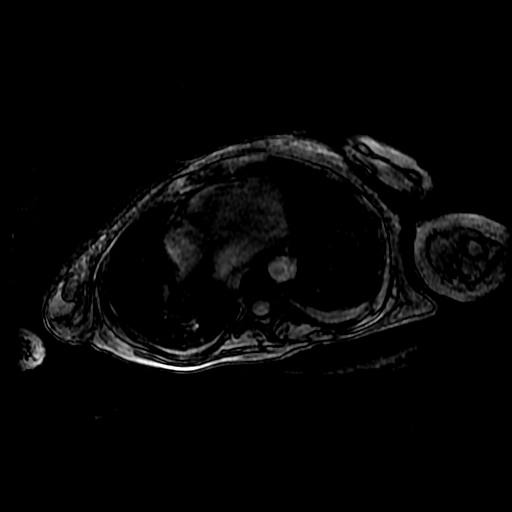

[Series 1300: T1 dynamic · axial · non-contrast · 4.6mm · 0.70mm/px · 1 of 80 slices shown (4 of 4)]
[im 1/80]
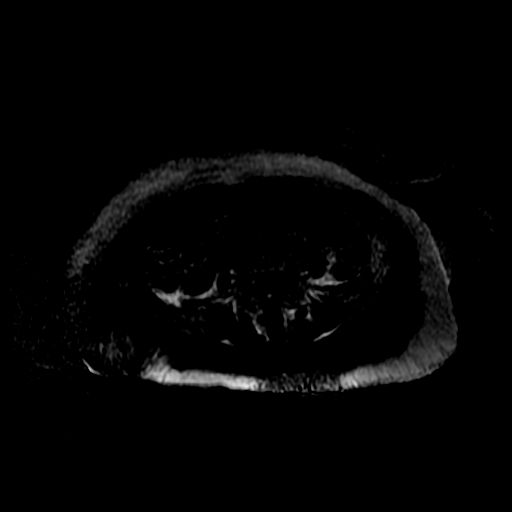

[19 of 48 positions shown; findings below may reference images not displayed]

FINDINGS: Lower chest:  Unremarkable.

Hepatobiliary: Several small hepatic cysts are noted, however no
liver masses are identified. Gallbladder is nearly completely
collapsed.

Diffuse biliary ductal dilatation is seen with common bile duct
measuring 14 mm in diameter. This is unchanged since previous study.
No definite evidence of choledocholithiasis.

Pancreas: No mass, inflammatory changes, or other parenchymal
abnormality identified. Mild diffuse pancreatic ductal dilatation is
seen which also appears stable compared to prior exam.

Spleen:  Within normal limits in size and appearance.

Adrenal Glands:  No mass identified.

Kidneys:  No masses identified.  No evidence of hydronephrosis.

Stomach/Bowel/Peritoneum: Visualized portions within the abdomen are
unremarkable.

Vascular/Lymphatic: No pathologically enlarged lymph nodes
identified. No other significant abnormality noted.

Other:  None.

Musculoskeletal:  No suspicious bone lesions identified.
IMPRESSION: Diffuse biliary ductal dilatation and mild pancreatic ductal
dilatation, without significant change compared to 0400 exam. No
evidence of choledocholithiasis, mass, or other obstructing
etiology.

Benign hepatic cysts.  No evidence of hepatic neoplasm.

## 2016-07-01 MED ORDER — DIPHENOXYLATE-ATROPINE 2.5-0.025 MG PO TABS: 1.0000 | ORAL_TABLET | Freq: Four times a day (QID) | ORAL | 0 refills | Status: DC | PRN

## 2016-07-01 MED ORDER — OXYCODONE-ACETAMINOPHEN 5-325 MG PO TABS
1.0000 | ORAL_TABLET | ORAL | 0 refills | Status: DC | PRN
Start: 2016-07-01 — End: 2016-08-13

## 2016-07-01 MED ORDER — VENLAFAXINE HCL 25 MG PO TABS: 25.0000 mg | ORAL_TABLET | Freq: Two times a day (BID) | ORAL | 0 refills | Status: DC

## 2016-07-01 MED ORDER — VENLAFAXINE HCL 25 MG PO TABS: 25.0000 mg | ORAL_TABLET | Freq: Two times a day (BID) | ORAL | 0 refills | Status: AC

## 2016-07-01 NOTE — Progress Notes (Signed)
Spoke to Dr. Izola Price about resending IVF paperwork. She states this does not need to be done. She and psych have signed off on patient to go home. She is safe to go per MD.

## 2016-07-01 NOTE — Discharge Instructions (Signed)

## 2016-07-01 NOTE — Progress Notes (Signed)
Pt states she usually takes trazadoe at night for sleep, on-call paged to receive these orders. Pt currently resting comfortably with safety sitter at bedside. Will continue to monitor.

## 2016-07-01 NOTE — Discharge Summary (Addendum)
Physician Discharge Summary  [name] ZOX:[id number] DOB: 04/22/1961 DOA: 06/25/2016  PCP: Georgann HousekeeperHUSAIN,KARRAR, MD  Admit date: 06/25/2016 Discharge date: 07/01/2016  Recommendations for Outpatient Follow-up:  1. Pt will need to follow up with PCP in 1-2 weeks post discharge 2. Please obtain BMP to evaluate electrolytes and kidney function 3. Please also check CBC to evaluate Hg and Hct levels  Discharge Diagnoses:  Principal Problem:   Delirium Active Problems:   Peripheral neuropathy (HCC)   Normocytic anemia   History of pulmonary embolus (PE)   Elevated lactic acid level   Chronic pain   Hypothyroidism   Pressure injury of skin  Discharge Condition: Stable  Diet recommendation: Heart healthy diet discussed in details   Brief Narrative:  55 y.o.femalewith history of PE and LLE DVT which required catheter lysis 12/2015 currently on Apixaban, hypothyroidism, chronic pain, hx of colitis treated with sulfasalazine, EtOH/polysubstance abuse with pancreatitis, numerous hospital admissions, previously assessed by psychiatry and deemed to not have capacity because of unwillingness to participate in conversations or care.  Notes from previous admissions document that she has tried to manipulate staff by refusing discharge to home and demanding SNF, then ultimately refusing SNF.  She has a history of refusing procedures and labs.  She is on multiple sedating medications prescribed by multiple doctors.  She was brought to the ER with confusion.  In the ER she complained of pain on touching and was intermittently oriented to name and place but not at other times.  CT of the head is unremarkable. Chest x-ray and UA were unremarkable. Patient's lactate level initially was normal but repeat was elevated. Patient was admitted for acute encephalopathy.   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   - psych  re consulted, recommended initiating low dose Effexor which was done - pt is calm this am, following all commands, asking if she can go home - her blood tests and VS are stable  - pt does not meet criteria for inpatient psych, OK to d/c IVC papers  - OK to d/c home   Dehydration  - with mildly elevated lactic acid.  No evidence of underlying infection - resolved   PE and LLE DVT  - which required catheter lysis 12/2015  - changed to home Eliquis and pt tolerating well   Colitis  - with hx of admissions for both diarrhea and constipation per d/c summaries - no diarrhea this AM   Hypothyroidism - resume synthroid   Anemia of chronic disease - hemoglobin near baseline - no blood work done due to pt refusal, try to obtain CBC in AM  Underweight - Regular diet with supplements - Supplements per Nutrition, appreciate assistance - Body mass index is 14.27 kg/m.  Stage 2 pressure ulcer - sacral pressure ulcer stage II present on admission, 0.5 x 0.5 cm round  - partial thickness open wound with red and pink wound bed, no drainage - WOC consulted and assistance appreciated   DVT prophylaxis:  Eliquis  Code Status:  full Family Communication:  discussed with son over the phone 9/30, pt asked me not call her son today  Disposition Plan:  Home    Consultants:   Psychiatry  WOC  Procedures:   None    Antimicrobials:   None     Procedures/Studies: Dg Chest 2 View  Result Date: 06/25/2016 CLINICAL DATA:  Generalized weakness and altered mental status. Initial encounter. EXAM: CHEST  2 VIEW COMPARISON:  May 01, 2016 FINDINGS: The heart size and mediastinal contours are stable. The aorta is tortuous. There is no focal infiltrate, pulmonary edema, or pleural effusion. The visualized skeletal structures are unremarkable. IMPRESSION: No active cardiopulmonary disease. Electronically Signed   By: Sherian Rein M.D.   On: 06/25/2016 21:22   Ct Head Wo  Contrast  Result Date: 06/26/2016 CLINICAL DATA:  55 year old female with increased confusion and recent fall. Altered mental status. EXAM: CT HEAD WITHOUT CONTRAST TECHNIQUE: Contiguous axial images were obtained from the base of the skull through the vertex without intravenous contrast. COMPARISON:  Head CT dated 03/31/2015 FINDINGS: Brain: There is mild age-related atrophy and chronic microvascular ischemic changes. There has been interval resolution of the previously seen diffuse white matter hypodensities. There is no acute intracranial hemorrhage. No mass effect or midline shift noted. No extra-axial fluid collection. Vascular: No hyperdense vessel or unexpected calcification. Skull: Normal. Negative for fracture or focal lesion. Sinuses/Orbits: No acute finding. Other: None IMPRESSION: No acute intracranial hemorrhage. Mild age-related atrophy and chronic microvascular ischemic disease. If symptoms persist and there are no contraindications, MRI may provide better evaluation if clinically indicated Electronically Signed   By: Elgie Collard M.D.   On: 06/26/2016 03:17    Discharge Exam: Vitals:   06/30/16 2103 07/01/16 0554  BP: 113/75 131/89  Pulse: 98 78  Resp: 16 18  Temp: 98.2 F (36.8 C) 99.1 F (37.3 C)   Vitals:   06/30/16 0547 06/30/16 1513 06/30/16 2103 07/01/16 0554  BP: 114/80 110/66 113/75 131/89  Pulse: 86 93 98 78  Resp: 18 18 16 18   Temp: 98 F (36.7 C) 98.4 F (36.9 C) 98.2 F (36.8 C) 99.1 F (37.3 C)  TempSrc: Oral Oral Oral Oral  SpO2: 100% 94% 100% 100%  Weight:      Height:        General: Pt is alert, follows commands appropriately, not in acute distress Cardiovascular: Regular rate and rhythm, S1/S2 +, no murmurs, no rubs, no gallops Respiratory: Clear to auscultation bilaterally, no wheezing, no crackles, no rhonchi Abdominal: Soft, non tender, non distended, bowel sounds +, no guarding  Discharge Instructions  Discharge Instructions    AMB  Referral to [facility] Care Management    Complete by:  As directed    Please assign to [facility] RNCM for transition of care. Has had x4 admissions in the past 6 months. Written consent obtained. Please call with questions. Raiford Noble, MSN-Ed, RN,BSN-THN [facility] Liaison-[phone]   Reason for consult:  Please assign to [facility] RNCM   Expected date of contact:  1-3 days (reserved for hospital discharges)   Diet - low sodium heart healthy    Complete by:  As directed    Increase activity slowly    Complete by:  As directed        Medication List    STOP taking these medications   HYDROcodone-acetaminophen 10-325 MG tablet Commonly known as:  NORCO   HYDROcodone-acetaminophen 5-325 MG tablet Commonly known as:  NORCO/VICODIN   polyethylene glycol packet Commonly known as:  MIRALAX / GLYCOLAX     TAKE these medications   apixaban 5 MG Tabs tablet Commonly known as:  ELIQUIS Take 1 tablet (5 mg total) by mouth 2 (two) times daily.   calcium carbonate 1250 (500 Ca) MG tablet Commonly known as:  OS-CAL - dosed in mg of elemental calcium Take 1 tablet (500 mg of elemental calcium total) by mouth  daily with breakfast.   diphenoxylate-atropine 2.5-0.025 MG tablet Commonly known as:  LOMOTIL Take 1 tablet by mouth 4 (four) times daily as needed for diarrhea or loose stools.   docusate sodium 100 MG capsule Commonly known as:  COLACE Take 2 capsules (200 mg total) by mouth 2 (two) times daily.   feeding supplement Liqd Take 1 Container by mouth 3 (three) times daily between meals.   folic acid 1 MG tablet Commonly known as:  FOLVITE Take 1 tablet (1 mg total) by mouth daily.   gabapentin 100 MG capsule Commonly known as:  NEURONTIN Take 1 capsule (100 mg total) by mouth 2 (two) times daily.   HYDROmorphone 4 MG tablet Commonly known as:  DILAUDID Take 8 mg by mouth every 4 (four) hours as needed for severe pain.   levothyroxine 25 MCG  tablet Commonly known as:  SYNTHROID, LEVOTHROID Take 1 tablet (25 mcg total) by mouth daily before breakfast.   ondansetron 4 MG disintegrating tablet Commonly known as:  ZOFRAN ODT Take 1 tablet (4 mg total) by mouth every 8 (eight) hours as needed for nausea or vomiting.   oxyCODONE-acetaminophen 5-325 MG tablet Commonly known as:  PERCOCET/ROXICET Take 1 tablet by mouth every 4 (four) hours as needed for moderate pain.   pantoprazole 20 MG tablet Commonly known as:  PROTONIX Take 2 tablets (40 mg total) by mouth daily.   potassium chloride SA 20 MEQ tablet Commonly known as:  K-DUR,KLOR-CON Take 2 tablets (40 mEq total) by mouth daily.   saccharomyces boulardii 250 MG capsule Commonly known as:  FLORASTOR Take 1 capsule (250 mg total) by mouth 2 (two) times daily.   thiamine 100 MG tablet Commonly known as:  VITAMIN B-1 Take 1 tablet (100 mg total) by mouth daily.   traZODone 25 mg Tabs tablet Commonly known as:  DESYREL Take 0.5 tablets (25 mg total) by mouth at bedtime as needed for sleep.   venlafaxine 25 MG tablet Commonly known as:  EFFEXOR Take 1 tablet (25 mg total) by mouth 2 (two) times daily.      Follow-up Information    Georgann Housekeeper, MD .   Specialty:  Internal Medicine Contact information: [address] [place] [address] [phone]            The results of significant diagnostics from this hospitalization (including imaging, microbiology, ancillary and laboratory) are listed below for reference.     Microbiology: Recent Results (from the past 240 hour(s))  C difficile quick scan w PCR reflex     Status: None   Collection Time: 06/27/16  9:22 AM  Result Value Ref Range Status   C Diff antigen NEGATIVE NEGATIVE Final   C Diff toxin NEGATIVE NEGATIVE Final   C Diff interpretation No C. difficile detected.  Final     Labs: Basic Metabolic Panel:  Recent Labs Lab 06/25/16 2124 06/30/16 0509 07/01/16 0513  NA  143 145 141  K 3.8 2.9* 3.8  CL 109 113* 112*  CO2 25 22 18*  GLUCOSE 75 61* 98  BUN <5* <5* <5*  CREATININE 0.61 0.64 0.61  CALCIUM 7.2* 7.0* 7.1*   Liver Function Tests:  Recent Labs Lab 06/25/16 2124  AST 39  ALT 10*  ALKPHOS 106  BILITOT 1.3*  PROT 4.8*  ALBUMIN 2.0*    Recent Labs Lab 06/25/16 2124  LIPASE 12   No results for input(s): AMMONIA in the last 168 hours. CBC:  Recent Labs Lab 06/25/16  2124 06/30/16 0509 07/01/16 0513  WBC 7.4 6.5 6.9  NEUTROABS 4.4  --   --   HGB 11.1* 9.9* 10.7*  HCT 31.8* 29.1* 31.5*  MCV 97.2 96.4 97.2  PLT 161 138* 149*   CBG:  Recent Labs Lab 06/25/16 2107  GLUCAP 70     SIGNED: Time coordinating discharge: Over minutes  Debbora Presto, MD  [facility] 07/01/2016, 11:07 AM Pager [phone]  If 7PM-7AM, please contact night-coverage [email or web address] Password [email or web address]

## 2016-07-01 NOTE — Progress Notes (Signed)
SW was informed by nurse that pt will need transportation home. Nurse states pt will be ready at 2:00pm. SW reached out to [facility] and scheduled pick up for 2:00pm.  Crista Curb, MSW [phone]

## 2016-07-01 NOTE — Progress Notes (Signed)
Reviewed discharge information with patient. Answered all questions. Patient able to teach back medications and reasons to contact MD/911. Patient verbalizes importance of PCP follow up appointment. Review all new prescriptions.  Earnest Conroy. Clelia Croft, RN

## 2016-07-03 ENCOUNTER — Encounter (HOSPITAL_COMMUNITY): Payer: Self-pay

## 2016-07-03 ENCOUNTER — Emergency Department (HOSPITAL_COMMUNITY)
Admission: EM | Admit: 2016-07-03 | Discharge: 2016-07-04 | Disposition: A | Discharge status: Home / Self Care | Payer: Medicare Other | Attending: Emergency Medicine | Admitting: Emergency Medicine

## 2016-07-03 ENCOUNTER — Other Ambulatory Visit: Payer: Self-pay

## 2016-07-03 DIAGNOSIS — I10 Essential (primary) hypertension: Secondary | ICD-10-CM | POA: Diagnosis not present

## 2016-07-03 DIAGNOSIS — G8929 Other chronic pain: Secondary | ICD-10-CM | POA: Diagnosis not present

## 2016-07-03 DIAGNOSIS — R000 Tachycardia, unspecified: Secondary | ICD-10-CM | POA: Diagnosis not present

## 2016-07-03 DIAGNOSIS — G894 Chronic pain syndrome: Secondary | ICD-10-CM | POA: Insufficient documentation

## 2016-07-03 DIAGNOSIS — Z79899 Other long term (current) drug therapy: Secondary | ICD-10-CM | POA: Insufficient documentation

## 2016-07-03 DIAGNOSIS — F17210 Nicotine dependence, cigarettes, uncomplicated: Secondary | ICD-10-CM | POA: Diagnosis not present

## 2016-07-03 DIAGNOSIS — J45909 Unspecified asthma, uncomplicated: Secondary | ICD-10-CM | POA: Diagnosis not present

## 2016-07-03 DIAGNOSIS — E039 Hypothyroidism, unspecified: Secondary | ICD-10-CM | POA: Diagnosis not present

## 2016-07-03 DIAGNOSIS — R220 Localized swelling, mass and lump, head: Secondary | ICD-10-CM

## 2016-07-03 DIAGNOSIS — E1140 Type 2 diabetes mellitus with diabetic neuropathy, unspecified: Secondary | ICD-10-CM | POA: Insufficient documentation

## 2016-07-03 DIAGNOSIS — Z7901 Long term (current) use of anticoagulants: Secondary | ICD-10-CM | POA: Insufficient documentation

## 2016-07-03 LAB — CBC WITH DIFFERENTIAL/PLATELET
BASOS ABS: 0.1 10*3/uL (ref 0.0–0.1)
Basophils Relative: 1 %
EOS ABS: 0.1 10*3/uL (ref 0.0–0.7)
EOS PCT: 1 %
HCT: 38.2 % (ref 36.0–46.0)
HEMOGLOBIN: 12.7 g/dL (ref 12.0–15.0)
Lymphocytes Relative: 33 %
Lymphs Abs: 3 10*3/uL (ref 0.7–4.0)
MCH: 32.7 pg (ref 26.0–34.0)
MCHC: 33.2 g/dL (ref 30.0–36.0)
MCV: 98.5 fL (ref 78.0–100.0)
Monocytes Absolute: 0.6 10*3/uL (ref 0.1–1.0)
Monocytes Relative: 7 %
NEUTROS PCT: 58 %
Neutro Abs: 5.2 10*3/uL (ref 1.7–7.7)
PLATELETS: 107 10*3/uL — AB (ref 150–400)
RBC: 3.88 MIL/uL (ref 3.87–5.11)
RDW: 14.8 % (ref 11.5–15.5)
WBC: 8.9 10*3/uL (ref 4.0–10.5)

## 2016-07-03 LAB — COMPREHENSIVE METABOLIC PANEL
ALBUMIN: 2.1 g/dL — AB (ref 3.5–5.0)
ALT: 16 U/L (ref 14–54)
ANION GAP: 9 (ref 5–15)
AST: 29 U/L (ref 15–41)
Alkaline Phosphatase: 104 U/L (ref 38–126)
CHLORIDE: 111 mmol/L (ref 101–111)
CO2: 21 mmol/L — ABNORMAL LOW (ref 22–32)
Calcium: 8 mg/dL — ABNORMAL LOW (ref 8.9–10.3)
Creatinine, Ser: 0.55 mg/dL (ref 0.44–1.00)
GFR calc Af Amer: >60 mL/min (ref >60)
GLUCOSE: 62 mg/dL — AB (ref 65–99)
POTASSIUM: 4.6 mmol/L (ref 3.5–5.1)
Sodium: 141 mmol/L (ref 135–145)
TOTAL PROTEIN: 5.5 g/dL — AB (ref 6.5–8.1)
Total Bilirubin: 0.5 mg/dL (ref 0.3–1.2)

## 2016-07-03 LAB — ETHANOL

## 2016-07-03 LAB — LIPASE, BLOOD: LIPASE: 16 U/L (ref 11–51)

## 2016-07-03 LAB — TROPONIN I: Troponin I: 0.03 ng/mL (ref <0.03)

## 2016-07-03 LAB — LACTIC ACID, PLASMA: LACTIC ACID, VENOUS: 1.6 mmol/L (ref 0.5–1.9)

## 2016-07-03 MED ORDER — FUROSEMIDE 20 MG PO TABS
40.0000 mg | ORAL_TABLET | Freq: Once | ORAL | Status: AC
  Administered 2016-07-03: 40 mg via ORAL
  Filled 2016-07-03: qty 2

## 2016-07-03 MED ORDER — IBUPROFEN 200 MG PO TABS
600.0000 mg | ORAL_TABLET | Freq: Once | ORAL | Status: AC
  Administered 2016-07-03: 600 mg via ORAL
  Filled 2016-07-03: qty 1

## 2016-07-03 MED ORDER — FUROSEMIDE 10 MG/ML IJ SOLN: 40.0000 mg | Freq: Once | INTRAMUSCULAR | Status: DC

## 2016-07-03 MED ORDER — FUROSEMIDE 20 MG PO TABS: 20.0000 mg | ORAL_TABLET | Freq: Every day | ORAL | 0 refills | Status: DC

## 2016-07-03 NOTE — ED Notes (Signed)
Pt alert watching tv  Med changed to po instead of iv

## 2016-07-03 NOTE — ED Notes (Signed)
STILL WAITING ON PTAR

## 2016-07-03 NOTE — ED Notes (Signed)
Waiting for ptar 

## 2016-07-03 NOTE — Patient Outreach (Signed)
New referral for transition of care: Placed call to patient. No answer. Unidentified machine. No message left.  PLAN: Will continue to outreach patient for transition of care.  [name], RN, BSN, CEN [facility] [employer] [phone]

## 2016-07-03 NOTE — ED Notes (Signed)
alrge formed yellow stool cleaned diaper placed pt anxious to get home upset when I told her she needed to wait for ptar to transport her back

## 2016-07-03 NOTE — ED Triage Notes (Signed)
Was seen at [facility] yesterday for delerium and dehydration. Went home yesterday and was fine. Family noticed today at 16:30 that there was notable facial swelling in the eyes and nose. No compromised airway and O2 sats normal and vitals stable

## 2016-07-03 NOTE — ED Notes (Signed)
Delay in blood draw,  Pt  Has to void.

## 2016-07-03 NOTE — ED Provider Notes (Signed)
[facility]-EMERGENCY DEPT Provider Note   CSN: [id number] Arrival date & time: 07/03/16  1732     History   Chief Complaint Chief Complaint  Patient presents with  . Facial Swelling    HPI [name] is a 55 y.o. female.  Pt presents to the ED today with facial swelling.  She was admitted to for mental status change and was d/c'd on the 1st.  She did have an elevated lactic acid while there and received IVFs.  The pt's family noticed some facial swelling, so they called EMS.  The pt denies any SOB or trouble swallowing.      Past Medical History:  Diagnosis Date  . Anxiety   . Arthritis    "a little bit; all over" (05/01/2016)  . Asthma   . Asthma   . C. difficile colitis   . Chronic lower back pain   . Crohn disease (HCC)   . Diabetes mellitus without complication (HCC)   . DVT (deep venous thrombosis) (HCC) ~ 03/2016   LLE  . Elevated lactic acid level   . GERD (gastroesophageal reflux disease)   . Glaucoma   . HLD (hyperlipidemia)   . Hypertension   . Hypothyroidism   . Neuropathy (HCC)   . Pancreatitis   . Pulmonary embolus (HCC) 05/24/2014  . Thrombocytopenia (HCC)   . Tobacco abuse     Patient Active Problem List   Diagnosis Date Noted  . Delirium 06/26/2016  . Pressure injury of skin 06/26/2016  . Abdominal pain, generalized   . Abdominal pain   . AKI (acute kidney injury) (HCC)   . Sepsis (HCC)   . UTI (lower urinary tract infection) 02/28/2016  . 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 Excoriated rash-medial thighs 02/28/2016  . DVT (deep venous thrombosis), unspecified laterality 01/29/2016  . Bilateral tibial fractures   . Hypothyroidism   . GERD (gastroesophageal reflux disease)   . HLD (hyperlipidemia)   . Enteritis due to Clostridium difficile 03/23/2015  . Thrombocytopenia (HCC) 03/21/2015  . Weakness of back 03/21/2015  . Chronic pain 02/28/2015  . Severe protein-calorie malnutrition (HCC) 12/30/2014  . Hepatic steatosis 12/29/2014  .  Chronic diarrhea of unknown origin 12/29/2014  . Elevated lactic acid level   . Dehydration 07/26/2014  . Arterial hypotension 07/26/2014  . History of pulmonary embolus (PE) 05/24/2014  . Physical deconditioning 05/14/2014  . Nicotine abuse 05/08/2014  . Normocytic anemia 10/15/2012  . Metabolic acidosis 10/14/2012  . Diabetes mellitus with complication (HCC) 05/12/2007  . Anxiety state 05/12/2007  . Depression 05/12/2007  . Peripheral neuropathy (HCC) 05/12/2007  . HTN (hypertension) 05/12/2007  . ALLERGIC RHINITIS 05/12/2007  . Asthma 05/12/2007  . PANCREATITIS, HX OF 05/12/2007    Past Surgical History:  Procedure Laterality Date  . CESAREAN SECTION  1996  . COLONOSCOPY N/A 03/02/2015   Procedure: COLONOSCOPY;  Surgeon: Bernette Redbird, MD;  Location: [facility] ENDOSCOPY;  Service: Endoscopy;  Laterality: N/A;  . ESOPHAGOGASTRODUODENOSCOPY N/A 05/21/2014   Procedure: ESOPHAGOGASTRODUODENOSCOPY (EGD);  Surgeon: Petra Kuba, MD;  Location: [facility] ENDOSCOPY;  Service: Endoscopy;  Laterality: N/A;  . ESOPHAGOGASTRODUODENOSCOPY N/A 03/02/2015   Procedure: ESOPHAGOGASTRODUODENOSCOPY (EGD);  Surgeon: Bernette Redbird, MD;  Location: [facility] ENDOSCOPY;  Service: Endoscopy;  Laterality: N/A;    OB History    No data available       Home Medications    Prior to Admission medications   Medication Sig Start Date End Date Taking? Authorizing Provider  apixaban (ELIQUIS) 5 MG TABS tablet Take  1 tablet (5 mg total) by mouth 2 (two) times daily. 05/02/16   Richarda OverlieNayana Abrol, MD  calcium carbonate (OS-CAL - DOSED IN MG OF ELEMENTAL CALCIUM) 1250 (500 CA) MG tablet Take 1 tablet (500 mg of elemental calcium total) by mouth daily with breakfast. 03/04/15   Alison MurrayAlma M Devine, MD  diphenoxylate-atropine (LOMOTIL) 2.5-0.025 MG tablet Take 1 tablet by mouth 4 (four) times daily as needed for diarrhea or loose stools. 07/01/16   Dorothea OgleIskra M Myers, MD  docusate sodium (COLACE) 100 MG capsule Take 2 capsules (200 mg total) by mouth 2  (two) times daily. 02/07/16   Leroy SeaPrashant K Singh, MD  feeding supplement, RESOURCE BREEZE, (RESOURCE BREEZE) LIQD Take 1 Container by mouth 3 (three) times daily between meals. Patient not taking: Reported on 06/25/2016 08/02/14   Kathlen ModyVijaya Akula, MD  folic acid (FOLVITE) 1 MG tablet Take 1 tablet (1 mg total) by mouth daily. 03/06/16   Richarda OverlieNayana Abrol, MD  furosemide (LASIX) 20 MG tablet Take 1 tablet (20 mg total) by mouth daily. 07/03/16   Jacalyn LefevreJulie Ayub Kirsh, MD  gabapentin (NEURONTIN) 100 MG capsule Take 1 capsule (100 mg total) by mouth 2 (two) times daily. 03/06/16   Richarda OverlieNayana Abrol, MD  HYDROmorphone (DILAUDID) 4 MG tablet Take 8 mg by mouth every 4 (four) hours as needed for severe pain.    Historical Provider, MD  levothyroxine (SYNTHROID, LEVOTHROID) 25 MCG tablet Take 1 tablet (25 mcg total) by mouth daily before breakfast. 03/06/16   Richarda OverlieNayana Abrol, MD  ondansetron (ZOFRAN ODT) 4 MG disintegrating tablet Take 1 tablet (4 mg total) by mouth every 8 (eight) hours as needed for nausea or vomiting. 05/15/16   Gerhard Munchobert Lockwood, MD  oxyCODONE-acetaminophen (PERCOCET/ROXICET) 5-325 MG tablet Take 1 tablet by mouth every 4 (four) hours as needed for moderate pain. 07/01/16   Dorothea OgleIskra M Myers, MD  pantoprazole (PROTONIX) 20 MG tablet Take 2 tablets (40 mg total) by mouth daily. 03/06/16   Richarda OverlieNayana Abrol, MD  potassium chloride SA (K-DUR,KLOR-CON) 20 MEQ tablet Take 2 tablets (40 mEq total) by mouth daily. 05/02/16 05/22/16  Richarda OverlieNayana Abrol, MD  saccharomyces boulardii (FLORASTOR) 250 MG capsule Take 1 capsule (250 mg total) by mouth 2 (two) times daily. Patient not taking: Reported on 06/25/2016 04/05/15   Vassie Lollarlos Madera, MD  thiamine (VITAMIN B-1) 100 MG tablet Take 1 tablet (100 mg total) by mouth daily. 03/06/16   Richarda OverlieNayana Abrol, MD  traZODone (DESYREL) 25 mg TABS tablet Take 0.5 tablets (25 mg total) by mouth at bedtime as needed for sleep. 08/02/14   Kathlen ModyVijaya Akula, MD  venlafaxine (EFFEXOR) 25 MG tablet Take 1 tablet (25 mg total) by mouth 2  (two) times daily. 07/01/16   Dorothea OgleIskra M Myers, MD    Family History Family History  Problem Relation Age of Onset  . Breast cancer Mother   . Heart disease Mother 7562    CABG  . Diabetes Mother   . Heart disease Father 6162    CABG  . Diabetes Father     Social History Social History  Substance Use Topics  . Smoking status: Current Every Day Smoker    Packs/day: 0.25    Years: 37.00    Types: Cigarettes  . Smokeless tobacco: Never Used  . Alcohol use No     Allergies   Iohexol; Spiriva handihaler [tiotropium bromide monohydrate]; Iodinated diagnostic agents; Ativan [lorazepam]; and Penicillins   Review of Systems Review of Systems  HENT:       Facial swelling  All other systems reviewed and are negative.    Physical Exam Updated Vital Signs BP (!) 146/109   Pulse 110   Temp 99.2 F (37.3 C) (Oral)   Ht 5\' 6"  (1.676 m)   Wt 88 lb (39.9 kg)   LMP 10/01/2010 Comment: Has depro  SpO2 99%   BMI 14.20 kg/m   Physical Exam  Constitutional: She is oriented to person, place, and time. She appears well-developed and well-nourished.  HENT:  Head: Atraumatic.  Right Ear: External ear normal.  Left Ear: External ear normal.  Nose: Nose normal.  Mouth/Throat: Oropharynx is clear and moist.  Eyelid edema  Eyes: EOM are normal. Pupils are equal, round, and reactive to light.  Neck: Normal range of motion. Neck supple.  Cardiovascular: Normal rate, regular rhythm, normal heart sounds and intact distal pulses.   Pulmonary/Chest: Effort normal and breath sounds normal.  Abdominal: Soft. Bowel sounds are normal.  Musculoskeletal: Normal range of motion.  Neurological: She is alert and oriented to person, place, and time.  Skin: Skin is warm.  Psychiatric: She has a normal mood and affect. Her behavior is normal. Judgment and thought content normal.  Nursing note and vitals reviewed.    ED Treatments / Results  Labs (all labs ordered are listed, but only abnormal  results are displayed) Labs Reviewed  COMPREHENSIVE METABOLIC PANEL - Abnormal; Notable for the following:       Result Value   CO2 21 (*)    Glucose, Bld 62 (*)    BUN <5 (*)    Calcium 8.0 (*)    Total Protein 5.5 (*)    Albumin 2.1 (*)    All other components within normal limits  TROPONIN I - Abnormal; Notable for the following:    Troponin I 0.03 (*)    All other components within normal limits  ETHANOL  LACTIC ACID, PLASMA  CBC WITH DIFFERENTIAL/PLATELET  LIPASE, BLOOD  LACTIC ACID, PLASMA  URINALYSIS, ROUTINE W REFLEX MICROSCOPIC (NOT AT [facility])  URINE RAPID DRUG SCREEN, HOSP PERFORMED    EKG  EKG Interpretation None       Radiology No results found.  Procedures Procedures (including critical care time)  Medications Ordered in ED Medications  furosemide (LASIX) tablet 40 mg (not administered)  ibuprofen (ADVIL,MOTRIN) tablet 600 mg (not administered)     Initial Impression / Assessment and Plan / ED Course  I have reviewed the triage vital signs and the nursing notes.  Pertinent labs & imaging results that were available during my care of the patient were reviewed by me and considered in my medical decision making (see chart for details).  Clinical Course   Pt requests pain medications for home for her chronic pain.  I told her that she needs to get those from her PCP.  She wants to stay in the hospital until the fluid in her face goes down, but I tried to explain to her that that is not something that requires admission.  She will be placed on lasix for 1 week to see if that helps.  She is instr to f/u with her pcp.  Final Clinical Impressions(s) / ED Diagnoses   Final diagnoses:  Facial swelling  Chronic pain syndrome    New Prescriptions New Prescriptions   FUROSEMIDE (LASIX) 20 MG TABLET    Take 1 tablet (20 mg total) by mouth daily.     Jacalyn Lefevre, MD 07/03/16 2050

## 2016-07-04 DIAGNOSIS — R531 Weakness: Secondary | ICD-10-CM | POA: Diagnosis not present

## 2016-07-04 DIAGNOSIS — G894 Chronic pain syndrome: Secondary | ICD-10-CM | POA: Diagnosis not present

## 2016-07-04 NOTE — ED Notes (Signed)
PTAR left with pt to transport pt home.  Discharge instructions and Rx were left here.  St's they will tell pt's son to come pick them up tomorrow.  Discharge instructions and Rx for Lasix left at [facility].

## 2016-07-05 ENCOUNTER — Other Ambulatory Visit: Payer: Self-pay

## 2016-07-05 NOTE — Patient Outreach (Signed)
Transition of care: Second attempt at transition of care. Unsuccessful. No voicemail.  PLAN: Will continue outreach efforts.   [name], RN, BSN, CEN [facility] [employer] [phone]

## 2016-07-09 DIAGNOSIS — I82492 Acute embolism and thrombosis of other specified deep vein of left lower extremity: Secondary | ICD-10-CM | POA: Diagnosis not present

## 2016-07-09 DIAGNOSIS — E1140 Type 2 diabetes mellitus with diabetic neuropathy, unspecified: Secondary | ICD-10-CM | POA: Diagnosis not present

## 2016-07-09 DIAGNOSIS — I10 Essential (primary) hypertension: Secondary | ICD-10-CM | POA: Diagnosis not present

## 2016-07-09 DIAGNOSIS — I82412 Acute embolism and thrombosis of left femoral vein: Secondary | ICD-10-CM | POA: Diagnosis not present

## 2016-07-09 DIAGNOSIS — I82442 Acute embolism and thrombosis of left tibial vein: Secondary | ICD-10-CM | POA: Diagnosis not present

## 2016-07-09 DIAGNOSIS — I82432 Acute embolism and thrombosis of left popliteal vein: Secondary | ICD-10-CM | POA: Diagnosis not present

## 2016-07-10 ENCOUNTER — Other Ambulatory Visit: Payer: Self-pay

## 2016-07-10 NOTE — Patient Outreach (Signed)
Transition of care attempt: 3rd unsuccessful attempt to reach patient. ..  PLAN: Will mail a letter to patient. If no response will close case in 10 days.  [name], RN, BSN, CEN [facility] [employer] [phone]

## 2016-07-12 DIAGNOSIS — I82432 Acute embolism and thrombosis of left popliteal vein: Secondary | ICD-10-CM | POA: Diagnosis not present

## 2016-07-12 DIAGNOSIS — I82412 Acute embolism and thrombosis of left femoral vein: Secondary | ICD-10-CM | POA: Diagnosis not present

## 2016-07-12 DIAGNOSIS — I10 Essential (primary) hypertension: Secondary | ICD-10-CM | POA: Diagnosis not present

## 2016-07-12 DIAGNOSIS — E1140 Type 2 diabetes mellitus with diabetic neuropathy, unspecified: Secondary | ICD-10-CM | POA: Diagnosis not present

## 2016-07-12 DIAGNOSIS — I82442 Acute embolism and thrombosis of left tibial vein: Secondary | ICD-10-CM | POA: Diagnosis not present

## 2016-07-12 DIAGNOSIS — I82492 Acute embolism and thrombosis of other specified deep vein of left lower extremity: Secondary | ICD-10-CM | POA: Diagnosis not present

## 2016-07-17 ENCOUNTER — Other Ambulatory Visit: Payer: Self-pay | Admitting: Geriatric Medicine

## 2016-07-17 DIAGNOSIS — Z7189 Other specified counseling: Secondary | ICD-10-CM | POA: Diagnosis not present

## 2016-07-17 DIAGNOSIS — Z1231 Encounter for screening mammogram for malignant neoplasm of breast: Secondary | ICD-10-CM

## 2016-07-17 DIAGNOSIS — Z1239 Encounter for other screening for malignant neoplasm of breast: Secondary | ICD-10-CM | POA: Diagnosis not present

## 2016-07-17 DIAGNOSIS — Z1389 Encounter for screening for other disorder: Secondary | ICD-10-CM | POA: Diagnosis not present

## 2016-07-17 DIAGNOSIS — E1142 Type 2 diabetes mellitus with diabetic polyneuropathy: Secondary | ICD-10-CM | POA: Diagnosis not present

## 2016-07-17 DIAGNOSIS — I739 Peripheral vascular disease, unspecified: Secondary | ICD-10-CM

## 2016-07-17 DIAGNOSIS — Z0000 Encounter for general adult medical examination without abnormal findings: Secondary | ICD-10-CM | POA: Diagnosis not present

## 2016-07-17 DIAGNOSIS — I10 Essential (primary) hypertension: Secondary | ICD-10-CM | POA: Diagnosis not present

## 2016-07-19 DIAGNOSIS — I82432 Acute embolism and thrombosis of left popliteal vein: Secondary | ICD-10-CM | POA: Diagnosis not present

## 2016-07-19 DIAGNOSIS — I82492 Acute embolism and thrombosis of other specified deep vein of left lower extremity: Secondary | ICD-10-CM | POA: Diagnosis not present

## 2016-07-19 DIAGNOSIS — I82442 Acute embolism and thrombosis of left tibial vein: Secondary | ICD-10-CM | POA: Diagnosis not present

## 2016-07-19 DIAGNOSIS — E1140 Type 2 diabetes mellitus with diabetic neuropathy, unspecified: Secondary | ICD-10-CM | POA: Diagnosis not present

## 2016-07-19 DIAGNOSIS — I82412 Acute embolism and thrombosis of left femoral vein: Secondary | ICD-10-CM | POA: Diagnosis not present

## 2016-07-19 DIAGNOSIS — I10 Essential (primary) hypertension: Secondary | ICD-10-CM | POA: Diagnosis not present

## 2016-07-24 DIAGNOSIS — I82492 Acute embolism and thrombosis of other specified deep vein of left lower extremity: Secondary | ICD-10-CM | POA: Diagnosis not present

## 2016-07-24 DIAGNOSIS — I82432 Acute embolism and thrombosis of left popliteal vein: Secondary | ICD-10-CM | POA: Diagnosis not present

## 2016-07-24 DIAGNOSIS — E1140 Type 2 diabetes mellitus with diabetic neuropathy, unspecified: Secondary | ICD-10-CM | POA: Diagnosis not present

## 2016-07-24 DIAGNOSIS — I10 Essential (primary) hypertension: Secondary | ICD-10-CM | POA: Diagnosis not present

## 2016-07-24 DIAGNOSIS — I82412 Acute embolism and thrombosis of left femoral vein: Secondary | ICD-10-CM | POA: Diagnosis not present

## 2016-07-24 DIAGNOSIS — I82442 Acute embolism and thrombosis of left tibial vein: Secondary | ICD-10-CM | POA: Diagnosis not present

## 2016-07-26 ENCOUNTER — Other Ambulatory Visit: Payer: Self-pay

## 2016-07-26 DIAGNOSIS — I82442 Acute embolism and thrombosis of left tibial vein: Secondary | ICD-10-CM | POA: Diagnosis not present

## 2016-07-26 DIAGNOSIS — I82492 Acute embolism and thrombosis of other specified deep vein of left lower extremity: Secondary | ICD-10-CM | POA: Diagnosis not present

## 2016-07-26 DIAGNOSIS — I10 Essential (primary) hypertension: Secondary | ICD-10-CM | POA: Diagnosis not present

## 2016-07-26 DIAGNOSIS — E1140 Type 2 diabetes mellitus with diabetic neuropathy, unspecified: Secondary | ICD-10-CM | POA: Diagnosis not present

## 2016-07-26 DIAGNOSIS — I82432 Acute embolism and thrombosis of left popliteal vein: Secondary | ICD-10-CM | POA: Diagnosis not present

## 2016-07-26 DIAGNOSIS — I82412 Acute embolism and thrombosis of left femoral vein: Secondary | ICD-10-CM | POA: Diagnosis not present

## 2016-07-26 NOTE — Patient Outreach (Signed)
Case closure: Not response from telephone outreach attempts. No response to letter after 10 days.  Plan: Close case. Will notify MD.  Rowe PavyAmanda Cook, RN, BSN, CEN [facility] [phone]

## 2016-07-30 ENCOUNTER — Other Ambulatory Visit: Payer: Self-pay

## 2016-07-31 ENCOUNTER — Other Ambulatory Visit: Payer: Self-pay

## 2016-07-31 DIAGNOSIS — I82432 Acute embolism and thrombosis of left popliteal vein: Secondary | ICD-10-CM | POA: Diagnosis not present

## 2016-07-31 DIAGNOSIS — E1140 Type 2 diabetes mellitus with diabetic neuropathy, unspecified: Secondary | ICD-10-CM | POA: Diagnosis not present

## 2016-07-31 DIAGNOSIS — I82492 Acute embolism and thrombosis of other specified deep vein of left lower extremity: Secondary | ICD-10-CM | POA: Diagnosis not present

## 2016-07-31 DIAGNOSIS — I10 Essential (primary) hypertension: Secondary | ICD-10-CM | POA: Diagnosis not present

## 2016-07-31 DIAGNOSIS — I82442 Acute embolism and thrombosis of left tibial vein: Secondary | ICD-10-CM | POA: Diagnosis not present

## 2016-07-31 DIAGNOSIS — I82412 Acute embolism and thrombosis of left femoral vein: Secondary | ICD-10-CM | POA: Diagnosis not present

## 2016-08-02 DIAGNOSIS — I10 Essential (primary) hypertension: Secondary | ICD-10-CM | POA: Diagnosis not present

## 2016-08-02 DIAGNOSIS — I82412 Acute embolism and thrombosis of left femoral vein: Secondary | ICD-10-CM | POA: Diagnosis not present

## 2016-08-02 DIAGNOSIS — I82492 Acute embolism and thrombosis of other specified deep vein of left lower extremity: Secondary | ICD-10-CM | POA: Diagnosis not present

## 2016-08-02 DIAGNOSIS — I82432 Acute embolism and thrombosis of left popliteal vein: Secondary | ICD-10-CM | POA: Diagnosis not present

## 2016-08-02 DIAGNOSIS — E1140 Type 2 diabetes mellitus with diabetic neuropathy, unspecified: Secondary | ICD-10-CM | POA: Diagnosis not present

## 2016-08-02 DIAGNOSIS — I82442 Acute embolism and thrombosis of left tibial vein: Secondary | ICD-10-CM | POA: Diagnosis not present

## 2016-08-06 DIAGNOSIS — I82442 Acute embolism and thrombosis of left tibial vein: Secondary | ICD-10-CM | POA: Diagnosis not present

## 2016-08-06 DIAGNOSIS — I82432 Acute embolism and thrombosis of left popliteal vein: Secondary | ICD-10-CM | POA: Diagnosis not present

## 2016-08-06 DIAGNOSIS — I82412 Acute embolism and thrombosis of left femoral vein: Secondary | ICD-10-CM | POA: Diagnosis not present

## 2016-08-06 DIAGNOSIS — L89312 Pressure ulcer of right buttock, stage 2: Secondary | ICD-10-CM | POA: Diagnosis not present

## 2016-08-07 DIAGNOSIS — I82412 Acute embolism and thrombosis of left femoral vein: Secondary | ICD-10-CM | POA: Diagnosis not present

## 2016-08-07 DIAGNOSIS — I82442 Acute embolism and thrombosis of left tibial vein: Secondary | ICD-10-CM | POA: Diagnosis not present

## 2016-08-07 DIAGNOSIS — E1140 Type 2 diabetes mellitus with diabetic neuropathy, unspecified: Secondary | ICD-10-CM | POA: Diagnosis not present

## 2016-08-07 DIAGNOSIS — L89312 Pressure ulcer of right buttock, stage 2: Secondary | ICD-10-CM | POA: Diagnosis not present

## 2016-08-07 DIAGNOSIS — I82492 Acute embolism and thrombosis of other specified deep vein of left lower extremity: Secondary | ICD-10-CM | POA: Diagnosis not present

## 2016-08-07 DIAGNOSIS — I82432 Acute embolism and thrombosis of left popliteal vein: Secondary | ICD-10-CM | POA: Diagnosis not present

## 2016-08-08 DIAGNOSIS — I82432 Acute embolism and thrombosis of left popliteal vein: Secondary | ICD-10-CM | POA: Diagnosis not present

## 2016-08-08 DIAGNOSIS — I82442 Acute embolism and thrombosis of left tibial vein: Secondary | ICD-10-CM | POA: Diagnosis not present

## 2016-08-08 DIAGNOSIS — I82412 Acute embolism and thrombosis of left femoral vein: Secondary | ICD-10-CM | POA: Diagnosis not present

## 2016-08-08 DIAGNOSIS — I82492 Acute embolism and thrombosis of other specified deep vein of left lower extremity: Secondary | ICD-10-CM | POA: Diagnosis not present

## 2016-08-08 DIAGNOSIS — L89312 Pressure ulcer of right buttock, stage 2: Secondary | ICD-10-CM | POA: Diagnosis not present

## 2016-08-08 DIAGNOSIS — E1140 Type 2 diabetes mellitus with diabetic neuropathy, unspecified: Secondary | ICD-10-CM | POA: Diagnosis not present

## 2016-08-11 ENCOUNTER — Inpatient Hospital Stay (HOSPITAL_COMMUNITY)
Admission: EM | Admit: 2016-08-11 | Discharge: 2016-08-15 | DRG: 871 | Disposition: A | Discharge status: Home / Self Care | Payer: Medicare Other | Attending: Internal Medicine | Admitting: Internal Medicine

## 2016-08-11 ENCOUNTER — Inpatient Hospital Stay (HOSPITAL_COMMUNITY): Payer: Medicare Other

## 2016-08-11 ENCOUNTER — Emergency Department (HOSPITAL_COMMUNITY): Payer: Medicare Other

## 2016-08-11 ENCOUNTER — Encounter (HOSPITAL_COMMUNITY): Payer: Self-pay

## 2016-08-11 DIAGNOSIS — N179 Acute kidney failure, unspecified: Secondary | ICD-10-CM | POA: Diagnosis not present

## 2016-08-11 DIAGNOSIS — E46 Unspecified protein-calorie malnutrition: Secondary | ICD-10-CM | POA: Diagnosis not present

## 2016-08-11 DIAGNOSIS — Z91041 Radiographic dye allergy status: Secondary | ICD-10-CM

## 2016-08-11 DIAGNOSIS — Z681 Body mass index (BMI) 19 or less, adult: Secondary | ICD-10-CM

## 2016-08-11 DIAGNOSIS — R64 Cachexia: Secondary | ICD-10-CM | POA: Diagnosis present

## 2016-08-11 DIAGNOSIS — E1140 Type 2 diabetes mellitus with diabetic neuropathy, unspecified: Secondary | ICD-10-CM | POA: Diagnosis present

## 2016-08-11 DIAGNOSIS — E876 Hypokalemia: Secondary | ICD-10-CM | POA: Diagnosis present

## 2016-08-11 DIAGNOSIS — K760 Fatty (change of) liver, not elsewhere classified: Secondary | ICD-10-CM | POA: Diagnosis present

## 2016-08-11 DIAGNOSIS — L89152 Pressure ulcer of sacral region, stage 2: Secondary | ICD-10-CM | POA: Diagnosis present

## 2016-08-11 DIAGNOSIS — I10 Essential (primary) hypertension: Secondary | ICD-10-CM | POA: Diagnosis present

## 2016-08-11 DIAGNOSIS — Z8249 Family history of ischemic heart disease and other diseases of the circulatory system: Secondary | ICD-10-CM

## 2016-08-11 DIAGNOSIS — R627 Adult failure to thrive: Secondary | ICD-10-CM | POA: Diagnosis present

## 2016-08-11 DIAGNOSIS — E875 Hyperkalemia: Secondary | ICD-10-CM | POA: Diagnosis not present

## 2016-08-11 DIAGNOSIS — H409 Unspecified glaucoma: Secondary | ICD-10-CM | POA: Diagnosis present

## 2016-08-11 DIAGNOSIS — B952 Enterococcus as the cause of diseases classified elsewhere: Secondary | ICD-10-CM | POA: Diagnosis present

## 2016-08-11 DIAGNOSIS — R197 Diarrhea, unspecified: Secondary | ICD-10-CM | POA: Diagnosis not present

## 2016-08-11 DIAGNOSIS — Z86711 Personal history of pulmonary embolism: Secondary | ICD-10-CM

## 2016-08-11 DIAGNOSIS — E43 Unspecified severe protein-calorie malnutrition: Secondary | ICD-10-CM | POA: Diagnosis not present

## 2016-08-11 DIAGNOSIS — Z86718 Personal history of other venous thrombosis and embolism: Secondary | ICD-10-CM

## 2016-08-11 DIAGNOSIS — R6520 Severe sepsis without septic shock: Secondary | ICD-10-CM | POA: Diagnosis not present

## 2016-08-11 DIAGNOSIS — E039 Hypothyroidism, unspecified: Secondary | ICD-10-CM | POA: Diagnosis present

## 2016-08-11 DIAGNOSIS — Z7901 Long term (current) use of anticoagulants: Secondary | ICD-10-CM

## 2016-08-11 DIAGNOSIS — E785 Hyperlipidemia, unspecified: Secondary | ICD-10-CM | POA: Diagnosis present

## 2016-08-11 DIAGNOSIS — F419 Anxiety disorder, unspecified: Secondary | ICD-10-CM | POA: Diagnosis present

## 2016-08-11 DIAGNOSIS — E872 Acidosis, unspecified: Secondary | ICD-10-CM | POA: Diagnosis present

## 2016-08-11 DIAGNOSIS — Z79899 Other long term (current) drug therapy: Secondary | ICD-10-CM

## 2016-08-11 DIAGNOSIS — R4182 Altered mental status, unspecified: Secondary | ICD-10-CM | POA: Diagnosis present

## 2016-08-11 DIAGNOSIS — D649 Anemia, unspecified: Secondary | ICD-10-CM | POA: Diagnosis present

## 2016-08-11 DIAGNOSIS — K219 Gastro-esophageal reflux disease without esophagitis: Secondary | ICD-10-CM | POA: Diagnosis present

## 2016-08-11 DIAGNOSIS — J45909 Unspecified asthma, uncomplicated: Secondary | ICD-10-CM | POA: Diagnosis not present

## 2016-08-11 DIAGNOSIS — R402421 Glasgow coma scale score 9-12, in the field [EMT or ambulance]: Secondary | ICD-10-CM | POA: Diagnosis not present

## 2016-08-11 DIAGNOSIS — A419 Sepsis, unspecified organism: Secondary | ICD-10-CM | POA: Diagnosis not present

## 2016-08-11 DIAGNOSIS — I248 Other forms of acute ischemic heart disease: Secondary | ICD-10-CM | POA: Diagnosis not present

## 2016-08-11 DIAGNOSIS — N390 Urinary tract infection, site not specified: Secondary | ICD-10-CM | POA: Diagnosis not present

## 2016-08-11 DIAGNOSIS — Z888 Allergy status to other drugs, medicaments and biological substances status: Secondary | ICD-10-CM

## 2016-08-11 DIAGNOSIS — K5090 Crohn's disease, unspecified, without complications: Secondary | ICD-10-CM | POA: Diagnosis not present

## 2016-08-11 DIAGNOSIS — F17210 Nicotine dependence, cigarettes, uncomplicated: Secondary | ICD-10-CM | POA: Diagnosis present

## 2016-08-11 DIAGNOSIS — Z833 Family history of diabetes mellitus: Secondary | ICD-10-CM

## 2016-08-11 DIAGNOSIS — Z880 Allergy status to penicillin: Secondary | ICD-10-CM

## 2016-08-11 DIAGNOSIS — R509 Fever, unspecified: Secondary | ICD-10-CM | POA: Diagnosis not present

## 2016-08-11 LAB — COMPREHENSIVE METABOLIC PANEL
ALBUMIN: 2.1 g/dL — AB (ref 3.5–5.0)
ALT: 13 U/L — AB (ref 14–54)
AST: 29 U/L (ref 15–41)
Alkaline Phosphatase: 80 U/L (ref 38–126)
Anion gap: 26 — ABNORMAL HIGH (ref 5–15)
BUN: 7 mg/dL (ref 6–20)
CHLORIDE: 105 mmol/L (ref 101–111)
CO2: 14 mmol/L — AB (ref 22–32)
CREATININE: 1.17 mg/dL — AB (ref 0.44–1.00)
Calcium: 8.1 mg/dL — ABNORMAL LOW (ref 8.9–10.3)
GFR calc Af Amer: 59 mL/min — ABNORMAL LOW (ref >60)
GFR, EST NON AFRICAN AMERICAN: 51 mL/min — AB (ref >60)
GLUCOSE: 76 mg/dL (ref 65–99)
Potassium: 3.7 mmol/L (ref 3.5–5.1)
Sodium: 145 mmol/L (ref 135–145)
Total Bilirubin: 1.8 mg/dL — ABNORMAL HIGH (ref 0.3–1.2)
Total Protein: 5.1 g/dL — ABNORMAL LOW (ref 6.5–8.1)

## 2016-08-11 LAB — URINALYSIS, ROUTINE W REFLEX MICROSCOPIC
GLUCOSE, UA: NEGATIVE mg/dL
Ketones, ur: >80 mg/dL — AB
Nitrite: NEGATIVE
PROTEIN: 30 mg/dL — AB
Specific Gravity, Urine: 1.02 (ref 1.005–1.030)
pH: 6 (ref 5.0–8.0)

## 2016-08-11 LAB — URINE MICROSCOPIC-ADD ON: Squamous Epithelial / LPF: NONE SEEN

## 2016-08-11 LAB — CBC WITH DIFFERENTIAL/PLATELET
Basophils Absolute: 0 10*3/uL (ref 0.0–0.1)
Basophils Relative: 1 %
EOS ABS: 0 10*3/uL (ref 0.0–0.7)
EOS PCT: 0 %
HCT: 35.2 % — ABNORMAL LOW (ref 36.0–46.0)
Hemoglobin: 11.8 g/dL — ABNORMAL LOW (ref 12.0–15.0)
LYMPHS ABS: 1.9 10*3/uL (ref 0.7–4.0)
LYMPHS PCT: 29 %
MCH: 31.4 pg (ref 26.0–34.0)
MCHC: 33.5 g/dL (ref 30.0–36.0)
MCV: 93.6 fL (ref 78.0–100.0)
MONO ABS: 0.4 10*3/uL (ref 0.1–1.0)
MONOS PCT: 6 %
Neutro Abs: 4.2 10*3/uL (ref 1.7–7.7)
Neutrophils Relative %: 64 %
PLATELETS: 218 10*3/uL (ref 150–400)
RBC: 3.76 MIL/uL — ABNORMAL LOW (ref 3.87–5.11)
RDW: 19.1 % — AB (ref 11.5–15.5)
WBC: 6.6 10*3/uL (ref 4.0–10.5)

## 2016-08-11 LAB — RAPID URINE DRUG SCREEN, HOSP PERFORMED
AMPHETAMINES: NOT DETECTED
BENZODIAZEPINES: NOT DETECTED
Barbiturates: NOT DETECTED
COCAINE: NOT DETECTED
Opiates: POSITIVE — AB
Tetrahydrocannabinol: NOT DETECTED

## 2016-08-11 LAB — I-STAT CG4 LACTIC ACID, ED
Lactic Acid, Venous: 3.12 mmol/L (ref 0.5–1.9)
Lactic Acid, Venous: 2.97 mmol/L (ref 0.5–1.9)

## 2016-08-11 LAB — POC OCCULT BLOOD, ED: FECAL OCCULT BLD: NEGATIVE

## 2016-08-11 LAB — AMMONIA: AMMONIA: 51 umol/L — AB (ref 9–35)

## 2016-08-11 MED ORDER — ACETAMINOPHEN 500 MG PO TABS
500.0000 mg | ORAL_TABLET | Freq: Once | ORAL | Status: AC
  Administered 2016-08-11: 500 mg via ORAL
  Filled 2016-08-11: qty 1

## 2016-08-11 MED ORDER — ACETAMINOPHEN 650 MG RE SUPP: 650.0000 mg | Freq: Four times a day (QID) | RECTAL | Status: DC | PRN

## 2016-08-11 MED ORDER — AZTREONAM 1 G IJ SOLR
1.0000 g | Freq: Three times a day (TID) | INTRAMUSCULAR | Status: DC
  Filled 2016-08-11 (×2): qty 1

## 2016-08-11 MED ORDER — VANCOMYCIN HCL IN DEXTROSE 750-5 MG/150ML-% IV SOLN
750.0000 mg | INTRAVENOUS | Status: DC
  Filled 2016-08-11: qty 150

## 2016-08-11 MED ORDER — SACCHAROMYCES BOULARDII 250 MG PO CAPS
250.0000 mg | ORAL_CAPSULE | Freq: Two times a day (BID) | ORAL | Status: DC
  Administered 2016-08-12 – 2016-08-15 (×8): 250 mg via ORAL
  Filled 2016-08-11 (×8): qty 1

## 2016-08-11 MED ORDER — GABAPENTIN 100 MG PO CAPS
100.0000 mg | ORAL_CAPSULE | Freq: Two times a day (BID) | ORAL | Status: DC
  Administered 2016-08-12 – 2016-08-15 (×8): 100 mg via ORAL
  Filled 2016-08-11 (×8): qty 1

## 2016-08-11 MED ORDER — APIXABAN 5 MG PO TABS
5.0000 mg | ORAL_TABLET | Freq: Two times a day (BID) | ORAL | Status: DC
  Administered 2016-08-12 – 2016-08-15 (×7): 5 mg via ORAL
  Filled 2016-08-11 (×8): qty 1

## 2016-08-11 MED ORDER — LEVOTHYROXINE SODIUM 25 MCG PO TABS
25.0000 ug | ORAL_TABLET | Freq: Every day | ORAL | Status: DC
  Administered 2016-08-12 – 2016-08-15 (×4): 25 ug via ORAL
  Filled 2016-08-11 (×4): qty 1

## 2016-08-11 MED ORDER — LORAZEPAM 2 MG/ML IJ SOLN
1.0000 mg | Freq: Once | INTRAMUSCULAR | Status: AC
  Administered 2016-08-11: 1 mg via INTRAVENOUS
  Filled 2016-08-11: qty 1

## 2016-08-11 MED ORDER — LEVOFLOXACIN IN D5W 500 MG/100ML IV SOLN: 500.0000 mg | INTRAVENOUS | Status: DC

## 2016-08-11 MED ORDER — VITAMIN B-1 100 MG PO TABS
100.0000 mg | ORAL_TABLET | Freq: Every day | ORAL | Status: DC
Start: 2016-08-12 — End: 2016-08-15
  Administered 2016-08-12 – 2016-08-15 (×4): 100 mg via ORAL
  Filled 2016-08-11 (×4): qty 1

## 2016-08-11 MED ORDER — BOOST / RESOURCE BREEZE PO LIQD
1.0000 | Freq: Three times a day (TID) | ORAL | Status: DC
  Administered 2016-08-12 – 2016-08-15 (×9): 1 via ORAL

## 2016-08-11 MED ORDER — PRO-STAT SUGAR FREE PO LIQD
30.0000 mL | Freq: Two times a day (BID) | ORAL | Status: DC
  Administered 2016-08-12 – 2016-08-15 (×4): 30 mL via ORAL
  Filled 2016-08-11 (×8): qty 30

## 2016-08-11 MED ORDER — LEVOFLOXACIN IN D5W 750 MG/150ML IV SOLN: 750.0000 mg | INTRAVENOUS | Status: DC

## 2016-08-11 MED ORDER — ACETAMINOPHEN 325 MG PO TABS
650.0000 mg | ORAL_TABLET | Freq: Four times a day (QID) | ORAL | Status: DC | PRN
  Administered 2016-08-12: 650 mg via ORAL
  Filled 2016-08-11: qty 2

## 2016-08-11 MED ORDER — SODIUM CHLORIDE 0.9 % IV SOLN
INTRAVENOUS | Status: DC
  Administered 2016-08-11: via INTRAVENOUS

## 2016-08-11 MED ORDER — TRAZODONE HCL 50 MG PO TABS
25.0000 mg | ORAL_TABLET | Freq: Every evening | ORAL | Status: DC | PRN
  Administered 2016-08-12 – 2016-08-14 (×3): 25 mg via ORAL
  Filled 2016-08-11 (×4): qty 1

## 2016-08-11 MED ORDER — SODIUM CHLORIDE 0.9 % IV BOLUS (SEPSIS)
10.0000 mL/kg | Freq: Once | INTRAVENOUS | Status: AC
  Administered 2016-08-11: 400 mL via INTRAVENOUS

## 2016-08-11 MED ORDER — SODIUM CHLORIDE 0.9 % IV BOLUS (SEPSIS)
20.0000 mL/kg | Freq: Once | INTRAVENOUS | Status: AC
  Administered 2016-08-11: 800 mL via INTRAVENOUS

## 2016-08-11 MED ORDER — PANTOPRAZOLE SODIUM 40 MG PO TBEC
40.0000 mg | DELAYED_RELEASE_TABLET | Freq: Every day | ORAL | Status: DC
  Administered 2016-08-12 – 2016-08-15 (×4): 40 mg via ORAL
  Filled 2016-08-11 (×4): qty 1

## 2016-08-11 MED ORDER — AZTREONAM 2 G IJ SOLR
2.0000 g | Freq: Once | INTRAMUSCULAR | Status: AC
  Administered 2016-08-11: 2 g via INTRAVENOUS
  Filled 2016-08-11: qty 2

## 2016-08-11 MED ORDER — LEVOFLOXACIN IN D5W 750 MG/150ML IV SOLN
750.0000 mg | Freq: Once | INTRAVENOUS | Status: AC
  Administered 2016-08-11: 750 mg via INTRAVENOUS
  Filled 2016-08-11: qty 150

## 2016-08-11 MED ORDER — FOLIC ACID 1 MG PO TABS
1.0000 mg | ORAL_TABLET | Freq: Every day | ORAL | Status: DC
  Administered 2016-08-12 – 2016-08-15 (×4): 1 mg via ORAL
  Filled 2016-08-11 (×4): qty 1

## 2016-08-11 MED ORDER — ONDANSETRON 4 MG PO TBDP: 4.0000 mg | ORAL_TABLET | Freq: Three times a day (TID) | ORAL | Status: DC | PRN

## 2016-08-11 MED ORDER — VENLAFAXINE HCL 25 MG PO TABS
25.0000 mg | ORAL_TABLET | Freq: Two times a day (BID) | ORAL | Status: DC
  Administered 2016-08-12 – 2016-08-15 (×8): 25 mg via ORAL
  Filled 2016-08-11 (×9): qty 1

## 2016-08-11 MED ORDER — VANCOMYCIN HCL IN DEXTROSE 1-5 GM/200ML-% IV SOLN
1000.0000 mg | Freq: Once | INTRAVENOUS | Status: AC
  Administered 2016-08-11: 1000 mg via INTRAVENOUS
  Filled 2016-08-11: qty 200

## 2016-08-11 NOTE — Progress Notes (Signed)
ANTICOAGULATION CONSULT NOTE - Initial Consult  Pharmacy Consult for Eliquis Indication: h/o PE/DVT  Allergies  Allergen Reactions  . Iohexol Other (See Comments)    "severe burning" Patient has received Contrast in 2005 with 13 hour pre-medication, and had no reaction at that time  . Spiriva Handihaler [Tiotropium Bromide Monohydrate] Nausea And Vomiting  . Iodinated Diagnostic Agents Other (See Comments)  . Ativan [Lorazepam] Other (See Comments)    hallucinations  . Penicillins Hives    Has had cephalosporins Has patient had a PCN reaction causing immediate rash, facial/tongue/throat swelling, SOB or lightheadedness with hypotension: NO Has patient had a PCN reaction causing severe rash involving mucus membranes or skin necrosis: YES Has patient had a PCN reaction that required hospitalization YES Has patient had a PCN reaction occurring within the last 10 years:YES If all of the above answers are "NO", then may proceed with Cephalosporin use.    Patient Measurements: Weight: 82 lb 4.8 oz (37.3 kg)  Vital Signs: Temp: 98 F (36.7 C) (11/11 2321) Temp Source: Oral (11/11 2321) BP: 101/72 (11/11 2321) Pulse Rate: 87 (11/11 2321)  Labs:  Recent Labs  08/11/16 1804  HGB 11.8*  HCT 35.2*  PLT 218  CREATININE 1.17*    Estimated Creatinine Clearance: 31.2 mL/min (by C-G formula based on SCr of 1.17 mg/dL (H)).   Medical History: Past Medical History:  Diagnosis Date  . Anxiety   . Arthritis    "a little bit; all over" (05/01/2016)  . Asthma   . Asthma   . C. difficile colitis   . Chronic lower back pain   . Crohn disease (HCC)   . Diabetes mellitus without complication (HCC)   . DVT (deep venous thrombosis) (HCC) ~ 03/2016   LLE  . Elevated lactic acid level   . GERD (gastroesophageal reflux disease)   . Glaucoma   . HLD (hyperlipidemia)   . Hypertension   . Hypothyroidism   . Neuropathy (HCC)   . Pancreatitis   . Pulmonary embolus (HCC) 05/24/2014  .  Thrombocytopenia (HCC)   . Tobacco abuse     Medications:  No current facility-administered medications on file prior to encounter.    Current Outpatient Prescriptions on File Prior to Encounter  Medication Sig Dispense Refill  . apixaban (ELIQUIS) 5 MG TABS tablet Take 1 tablet (5 mg total) by mouth 2 (two) times daily. 60 tablet 0  . calcium carbonate (OS-CAL - DOSED IN MG OF ELEMENTAL CALCIUM) 1250 (500 CA) MG tablet Take 1 tablet (500 mg of elemental calcium total) by mouth daily with breakfast. 30 tablet 0  . diphenoxylate-atropine (LOMOTIL) 2.5-0.025 MG tablet Take 1 tablet by mouth 4 (four) times daily as needed for diarrhea or loose stools. 30 tablet 0  . docusate sodium (COLACE) 100 MG capsule Take 2 capsules (200 mg total) by mouth 2 (two) times daily. (Patient not taking: Reported on 07/03/2016) 20 capsule 0  . feeding supplement, RESOURCE BREEZE, (RESOURCE BREEZE) LIQD Take 1 Container by mouth 3 (three) times daily between meals. (Patient not taking: Reported on 07/03/2016)  0  . folic acid (FOLVITE) 1 MG tablet Take 1 tablet (1 mg total) by mouth daily. 30 tablet 0  . furosemide (LASIX) 20 MG tablet Take 1 tablet (20 mg total) by mouth daily. 7 tablet 0  . gabapentin (NEURONTIN) 100 MG capsule Take 1 capsule (100 mg total) by mouth 2 (two) times daily.    Marland Kitchen HYDROmorphone (DILAUDID) 4 MG tablet Take 8 mg by  mouth every 4 (four) hours as needed for severe pain.    Marland Kitchen. levothyroxine (SYNTHROID, LEVOTHROID) 25 MCG tablet Take 1 tablet (25 mcg total) by mouth daily before breakfast. 30 tablet 0  . ondansetron (ZOFRAN ODT) 4 MG disintegrating tablet Take 1 tablet (4 mg total) by mouth every 8 (eight) hours as needed for nausea or vomiting. 20 tablet 0  . oxyCODONE-acetaminophen (PERCOCET/ROXICET) 5-325 MG tablet Take 1 tablet by mouth every 4 (four) hours as needed for moderate pain. (Patient not taking: Reported on 07/03/2016) 10 tablet 0  . pantoprazole (PROTONIX) 20 MG tablet Take 2  tablets (40 mg total) by mouth daily. 30 tablet 1  . potassium chloride SA (K-DUR,KLOR-CON) 20 MEQ tablet Take 2 tablets (40 mEq total) by mouth daily. 20 tablet 0  . potassium chloride SA (K-DUR,KLOR-CON) 20 MEQ tablet Take 20 mEq by mouth 2 (two) times daily.    Marland Kitchen. saccharomyces boulardii (FLORASTOR) 250 MG capsule Take 1 capsule (250 mg total) by mouth 2 (two) times daily.    Marland Kitchen. thiamine (VITAMIN B-1) 100 MG tablet Take 1 tablet (100 mg total) by mouth daily. 30 tablet 0  . traZODone (DESYREL) 25 mg TABS tablet Take 0.5 tablets (25 mg total) by mouth at bedtime as needed for sleep. 30 tablet 0  . venlafaxine (EFFEXOR) 25 MG tablet Take 1 tablet (25 mg total) by mouth 2 (two) times daily. 30 tablet 0     Assessment: 55 y.o. female admitted with UTI, h/o PE/DVT, to continue Eliquis  Plan:  Eliquis 5 mg BID  Eddie Candlebbott, Bernal Luhman [place] 08/11/2016,11:34 PM

## 2016-08-11 NOTE — ED Triage Notes (Signed)
Patient comes by EMS for AMS onset yesterday per family.  EMS states that family mentioned patient is uncompliant with meds.  Patient is alert to person only. Patient only states her name and then states she is cold.

## 2016-08-11 NOTE — Progress Notes (Signed)
Pharmacy Antibiotic Note  [name] is a 55 y.o. female admitted on 08/11/2016 with UTI.  Pharmacy has been consulted for levofloxacin.  Plan: Change Levaquin 500 mg IV q48h  Weight: 82 lb 4.8 oz (37.3 kg)  Temp (24hrs), Avg:99.4 F (37.4 C), Min:98 F (36.7 C), Max:100.7 F (38.2 C)   Recent Labs Lab 08/11/16 1804 08/11/16 1806 08/11/16 2053  WBC 6.6  --   --   CREATININE 1.17*  --   --   LATICACIDVEN  --  3.12* 2.97*    Estimated Creatinine Clearance: 31.2 mL/min (by C-G formula based on SCr of 1.17 mg/dL (H)).    Allergies  Allergen Reactions  . Iohexol Other (See Comments)    "severe burning" Patient has received Contrast in 2005 with 13 hour pre-medication, and had no reaction at that time  . Spiriva Handihaler [Tiotropium Bromide Monohydrate] Nausea And Vomiting  . Iodinated Diagnostic Agents Other (See Comments)  . Ativan [Lorazepam] Other (See Comments)    hallucinations  . Penicillins Hives    Has had cephalosporins Has patient had a PCN reaction causing immediate rash, facial/tongue/throat swelling, SOB or lightheadedness with hypotension: NO Has patient had a PCN reaction causing severe rash involving mucus membranes or skin necrosis: YES Has patient had a PCN reaction that required hospitalization YES Has patient had a PCN reaction occurring within the last 10 years:YES If all of the above answers are "NO", then may proceed with Cephalosporin use.   Geannie Risen, PharmD, BCPS  08/11/2016 11:30 PM

## 2016-08-11 NOTE — Progress Notes (Signed)
Pharmacy Antibiotic Note  [name] is a 55 y.o. female admitted on 08/11/2016 with sepsis.  Pharmacy has been consulted for levofloxacin, vancomycin, and aztreonam dosing.  Plan: Vancomycin 1 g x 1 then 750 mg q24h Aztreonam 2 g x 1 then 1 g q8h Levofloxacin 750 mg q48h Monitor renal fx, cx, vt prn  Weight: 88 lb 2.9 oz (40 kg)  Temp (24hrs), Avg:100.7 F (38.2 C), Min:100.7 F (38.2 C), Max:100.7 F (38.2 C)   Recent Labs Lab 08/11/16 1804 08/11/16 1806  WBC 6.6  --   CREATININE 1.17*  --   LATICACIDVEN  --  3.12*    Estimated Creatinine Clearance: 33.5 mL/min (by C-G formula based on SCr of 1.17 mg/dL (H)).    Allergies  Allergen Reactions  . Iohexol Other (See Comments)    "severe burning" Patient has received Contrast in 2005 with 13 hour pre-medication, and had no reaction at that time  . Spiriva Handihaler [Tiotropium Bromide Monohydrate] Nausea And Vomiting  . Iodinated Diagnostic Agents Other (See Comments)  . Ativan [Lorazepam] Other (See Comments)    hallucinations  . Penicillins Hives    Has had cephalosporins Has patient had a PCN reaction causing immediate rash, facial/tongue/throat swelling, SOB or lightheadedness with hypotension: NO Has patient had a PCN reaction causing severe rash involving mucus membranes or skin necrosis: YES Has patient had a PCN reaction that required hospitalization YES Has patient had a PCN reaction occurring within the last 10 years:YES If all of the above answers are "NO", then may proceed with Cephalosporin use.  [name], PharmD, BCPS, [facility] LLC[facility]CCCP Clinical Pharmacist Pager [phone] 08/11/2016 7:19 PM

## 2016-08-11 NOTE — H&P (Addendum)
TRH H&P   Patient Demographics:    [name], is a 55 y.o. female  MRN: [id number]   DOB - 28-Sep-1961  Admit Date - 08/11/2016  Outpatient Primary MD for the patient is Wenda Low, MD  Referring MD/NP/PA: Sherwood Gambler  Outpatient Specialists:   Patient coming from: home  Chief Complaint  Patient presents with  . Altered Mental Status  . Failure To Thrive      HPI:    [name]  is a 55 y.o. female,  w PE/ DVT, Crohns disease, Protein calorie malnutrition, apparently presents with altered mental status.  Pt  Is unable to tell me how she arrived to the ED.    In the ED,  Pt noted to be tachycardic and to have uti as well as altered mental status.  axox2 (person, place)  CT scan brain pending.  Pt will be admitted for altered mental status secondary to uti.     Review of systems:    In addition to the HPI above,  No Fever-chills, No Headache, No changes with Vision or hearing, No problems swallowing food or Liquids, No Chest pain, Cough or Shortness of Breath, No Abdominal pain, No Nausea or Vommitting, + diarrhea No Blood in stool or Urine, + slight dysuria No new skin rashes or bruises, No new joints pains-aches,  No new weakness, tingling, numbness in any extremity, No recent weight gain or loss, No polyuria, polydypsia or polyphagia, No significant Mental Stressors.  A full 10 point Review of Systems was done, except as stated above, all other Review of Systems were negative.   With Past History of the following :    Past Medical History:  Diagnosis Date  . Anxiety   . Arthritis    "a little bit; all over" (05/01/2016)  . Asthma   . Asthma   . C. difficile colitis   . Chronic lower back pain   . Crohn disease ([place])   . Diabetes mellitus without complication ([place])   . DVT (deep venous thrombosis) ([place]) ~ 03/2016   LLE  . Elevated lactic  acid level   . GERD (gastroesophageal reflux disease)   . Glaucoma   . HLD (hyperlipidemia)   . Hypertension   . Hypothyroidism   . Neuropathy ([place])   . Pancreatitis   . Pulmonary embolus ([place]) 05/24/2014  . Thrombocytopenia ([place])   . Tobacco abuse       Past Surgical History:  Procedure Laterality Date  . CESAREAN SECTION  1996  . COLONOSCOPY N/A 03/02/2015   Procedure: COLONOSCOPY;  Surgeon: Ronald Lobo, MD;  Location[facility] ENDOSCOPY;  Service: Endoscopy;  Laterality: N/A;  . ESOPHAGOGASTRODUODENOSCOPY N/A 05/21/2014   Procedure: ESOPHAGOGASTRODUODENOSCOPY (EGD);  Surgeon: Jeryl Columbia, MD;  Location: [facility] ENDOSCOPY;  Service: Endoscopy;  Laterality: N/A;  . ESOPHAGOGASTRODUODENOSCOPY N/A 03/02/2015   Procedure: ESOPHAGOGASTRODUODENOSCOPY (EGD);  Surgeon: Ronald Lobo, MD;  Location: [facility] ENDOSCOPY;  Service: Endoscopy;  Laterality: N/A;      Social History:     Social History  Substance Use Topics  . Smoking status: Current Every Day Smoker    Packs/day: 0.25    Years: 37.00    Types: Cigarettes  . Smokeless tobacco: Never Used  . Alcohol use No     Lives - at home  Mobility - typically walks w walker per pt     Family History :     Family History  Problem Relation Age of Onset  . Breast cancer Mother   . Heart disease Mother 31    CABG  . Diabetes Mother   . Heart disease Father 66    CABG  . Diabetes Father       Home Medications:   Prior to Admission medications   Medication Sig Start Date End Date Taking? Authorizing Provider  apixaban (ELIQUIS) 5 MG TABS tablet Take 1 tablet (5 mg total) by mouth 2 (two) times daily. 05/02/16   Reyne Dumas, MD  calcium carbonate (OS-CAL - DOSED IN MG OF ELEMENTAL CALCIUM) 1250 (500 CA) MG tablet Take 1 tablet (500 mg of elemental calcium total) by mouth daily with breakfast. 03/04/15   Robbie Lis, MD  diphenoxylate-atropine (LOMOTIL) 2.5-0.025 MG tablet Take 1 tablet by mouth 4 (four) times daily as needed for  diarrhea or loose stools. 07/01/16   Theodis Blaze, MD  docusate sodium (COLACE) 100 MG capsule Take 2 capsules (200 mg total) by mouth 2 (two) times daily. Patient not taking: Reported on 07/03/2016 02/07/16   Thurnell Lose, MD  feeding supplement, RESOURCE BREEZE, (RESOURCE BREEZE) LIQD Take 1 Container by mouth 3 (three) times daily between meals. Patient not taking: Reported on 07/03/2016 08/02/14   Hosie Poisson, MD  folic acid (FOLVITE) 1 MG tablet Take 1 tablet (1 mg total) by mouth daily. 03/06/16   Reyne Dumas, MD  furosemide (LASIX) 20 MG tablet Take 1 tablet (20 mg total) by mouth daily. 07/03/16   Isla Pence, MD  gabapentin (NEURONTIN) 100 MG capsule Take 1 capsule (100 mg total) by mouth 2 (two) times daily. 03/06/16   Reyne Dumas, MD  HYDROmorphone (DILAUDID) 4 MG tablet Take 8 mg by mouth every 4 (four) hours as needed for severe pain.    Historical Provider, MD  levothyroxine (SYNTHROID, LEVOTHROID) 25 MCG tablet Take 1 tablet (25 mcg total) by mouth daily before breakfast. 03/06/16   Reyne Dumas, MD  ondansetron (ZOFRAN ODT) 4 MG disintegrating tablet Take 1 tablet (4 mg total) by mouth every 8 (eight) hours as needed for nausea or vomiting. 05/15/16   Carmin Muskrat, MD  oxyCODONE-acetaminophen (PERCOCET/ROXICET) 5-325 MG tablet Take 1 tablet by mouth every 4 (four) hours as needed for moderate pain. Patient not taking: Reported on 07/03/2016 07/01/16   Theodis Blaze, MD  pantoprazole (PROTONIX) 20 MG tablet Take 2 tablets (40 mg total) by mouth daily. 03/06/16   Reyne Dumas, MD  potassium chloride SA (K-DUR,KLOR-CON) 20 MEQ tablet Take 2 tablets (40 mEq total) by mouth daily. 05/02/16 05/22/16  Reyne Dumas, MD  potassium chloride SA (K-DUR,KLOR-CON) 20 MEQ tablet Take 20 mEq by mouth 2 (two) times daily.    Historical Provider, MD  saccharomyces boulardii (FLORASTOR) 250 MG capsule Take 1 capsule (250 mg total) by mouth 2 (two) times daily. 04/05/15   Barton Dubois, MD  thiamine (VITAMIN  B-1) 100 MG tablet Take 1 tablet (100 mg total) by  mouth daily. 03/06/16   Reyne Dumas, MD  traZODone (DESYREL) 25 mg TABS tablet Take 0.5 tablets (25 mg total) by mouth at bedtime as needed for sleep. 08/02/14   Hosie Poisson, MD  venlafaxine (EFFEXOR) 25 MG tablet Take 1 tablet (25 mg total) by mouth 2 (two) times daily. 07/01/16   Theodis Blaze, MD     Allergies:     Allergies  Allergen Reactions  . Iohexol Other (See Comments)    "severe burning" Patient has received Contrast in 2005 with 13 hour pre-medication, and had no reaction at that time  . Spiriva Handihaler [Tiotropium Bromide Monohydrate] Nausea And Vomiting  . Iodinated Diagnostic Agents Other (See Comments)  . Ativan [Lorazepam] Other (See Comments)    hallucinations  . Penicillins Hives    Has had cephalosporins Has patient had a PCN reaction causing immediate rash, facial/tongue/throat swelling, SOB or lightheadedness with hypotension: NO Has patient had a PCN reaction causing severe rash involving mucus membranes or skin necrosis: YES Has patient had a PCN reaction that required hospitalization YES Has patient had a PCN reaction occurring within the last 10 years:YES If all of the above answers are "NO", then may proceed with Cephalosporin use.     Physical Exam:   Vitals  Blood pressure (!) 124/106, pulse (!) 148, temperature 100.7 F (38.2 C), temperature source Rectal, resp. rate 21, weight 40 kg (88 lb 2.9 oz), last menstrual period 10/01/2010, SpO2 100 %.   1. General  lying in bed in NAD,    2. Normal affect and insight, Not Suicidal or Homicidal, Awake Alert, Oriented X 3.  3. No F.N deficits, ALL C.Nerves Intact, Strength 5/5 all 4 extremities, Sensation intact all 4 extremities, Plantars down going.  4. Ears and Eyes appear Normal, Conjunctivae clear, PERRLA. Moist Oral Mucosa.  5. Supple Neck, No JVD, No cervical lymphadenopathy appriciated, No Carotid Bruits.  6. Symmetrical Chest wall movement,  Good air movement bilaterally, CTAB.  7. RRR, No Gallops, Rubs or Murmurs, No Parasternal Heave.  8. Positive Bowel Sounds, Abdomen Soft, No tenderness, No organomegaly appriciated,No rebound -guarding or rigidity.  9.  No Cyanosis, Normal Skin Turgor, No Skin Rash or Bruise.  10. Good muscle tone,  joints appear normal , no effusions, Normal ROM.  11. No Palpable Lymph Nodes in Neck or Axillae    Data Review:    CBC  Recent Labs Lab 08/11/16 1804  WBC 6.6  HGB 11.8*  HCT 35.2*  PLT 218  MCV 93.6  MCH 31.4  MCHC 33.5  RDW 19.1*  LYMPHSABS 1.9  MONOABS 0.4  EOSABS 0.0  BASOSABS 0.0   ------------------------------------------------------------------------------------------------------------------  Chemistries   Recent Labs Lab 08/11/16 1804  NA 145  K 3.7  CL 105  CO2 14*  GLUCOSE 76  BUN 7  CREATININE 1.17*  CALCIUM 8.1*  AST 29  ALT 13*  ALKPHOS 80  BILITOT 1.8*   ------------------------------------------------------------------------------------------------------------------ estimated creatinine clearance is 33.5 mL/min (by C-G formula based on SCr of 1.17 mg/dL (H)). ------------------------------------------------------------------------------------------------------------------ No results for input(s): TSH, T4TOTAL, T3FREE, THYROIDAB in the last 72 hours.  Invalid input(s): FREET3  Coagulation profile No results for input(s): INR, PROTIME in the last 168 hours. ------------------------------------------------------------------------------------------------------------------- No results for input(s): DDIMER in the last 72 hours. -------------------------------------------------------------------------------------------------------------------  Cardiac Enzymes No results for input(s): CKMB, TROPONINI, MYOGLOBIN in the last 168 hours.  Invalid input(s):  CK ------------------------------------------------------------------------------------------------------------------    Component Value Date/Time   BNP 77.5 12/28/2014 1905     ---------------------------------------------------------------------------------------------------------------  Urinalysis    Component Value Date/Time   COLORURINE AMBER (A) 08/11/2016 1941   APPEARANCEUR TURBID (A) 08/11/2016 1941   LABSPEC 1.020 08/11/2016 1941   PHURINE 6.0 08/11/2016 1941   GLUCOSEU NEGATIVE 08/11/2016 1941   HGBUR SMALL (A) 08/11/2016 1941   BILIRUBINUR LARGE (A) 08/11/2016 1941   KETONESUR >80 (A) 08/11/2016 1941   PROTEINUR 30 (A) 08/11/2016 1941   UROBILINOGEN 1.0 02/28/2015 1227   NITRITE NEGATIVE 08/11/2016 1941   LEUKOCYTESUR LARGE (A) 08/11/2016 1941    ----------------------------------------------------------------------------------------------------------------   Imaging Results:    Dg Chest Port 1 View  Result Date: 08/11/2016 CLINICAL DATA:  Altered mental status, asthma, diabetes and hypertension. EXAM: PORTABLE CHEST 1 VIEW COMPARISON:  06/25/2016 FINDINGS: The lungs are clear. Minimal atelectasis at the left costophrenic angle. Cardiomediastinal contours are stable and within normal limits. Old right posterior sixth rib fracture. No acute osseous abnormality. No effusion. IMPRESSION: No active disease.  Old right posterior sixth rib fracture. Electronically Signed   By: Ashley Royalty M.D.   On: 08/11/2016 19:02      Assessment & Plan:    Active Problems:   Sepsis ([place])   Altered mental status   Protein calorie malnutrition (HCC)   Diarrhea    1. AMS Check b12 esr, ana, rpr, tsh Check CT brain noncontrast tx uti  2. Uti Start on levaquin pharmacy to dose  3. Sepsis secondary to uti levaquin iv pharmacy to dose vanco iv x1 as well as aztreonam iv x1 in the ED Await urine culture  4. PE/ DVT Cont eliquis, pharmacy to dose  5. AG acidosis/ lactic  acidosis Ns iv  6. Protein calorie malnutrition (severe) prostat   7. Renal insufficiency Hydrate with ns iv Check cmp in am  8. Diarrhea Check stool studies  9.  Anemia fobt pending Check ferritin, iron tibc, b12, folate, tsh, esr, consider spep, upep Check cbc in am  10. Tachycardia Trop I q6h x3 tsh Cardiac echo  DVT Prophylaxis Eliquis     AM Labs Ordered, also please review Full Orders  Family Communication: Admission, patients condition and plan of care including tests being ordered have been discussed with the patient who indicate understanding and agree with the plan and Code Status.  Code Status FULL CODE  Likely DC to  ?  Condition GUARDED    Consults called:   Admission status:   inpatient  Time spent in minutes : 45   Jani Gravel M.D on 08/11/2016 at 9:33 PM  Between 7am to 7pm - Pager - [phone]. After 7pm go to [email or web address] - password [facility]  [facility] - Office  [phone]

## 2016-08-11 NOTE — ED Notes (Signed)
Report given to [facility].  Patient stable for transport at this time.  Belongings taken with the patient

## 2016-08-11 NOTE — ED Provider Notes (Signed)
[facility]-EMERGENCY DEPT Provider Note   CSN: [id number] Arrival date & time: 08/11/16  1702   LEVEL 5 CAVEAT - ALTERED MENTAL STATUS  History   Chief Complaint Chief Complaint  Patient presents with  . Altered Mental Status  . Failure To Thrive    HPI BRODIE SCOVELL is a 55 y.o. female.  HPI  55 year old female with a history of diabetes, drug abuse, C. difficile, and recent admission for delirium presents with altered mental status. History taken from the nurse he talked EMS. Family is not available and the patient is currently altered and cannot provide history. Patient reportedly has been altered from her baseline. Unknown if she's having fevers. Patient answers yes and all over to multiple questions but does not seem to understand the questions. I tried calling family but with the phone number listed no one has answered.  Past Medical History:  Diagnosis Date  . Anxiety   . Arthritis    "a little bit; all over" (05/01/2016)  . Asthma   . Asthma   . C. difficile colitis   . Chronic lower back pain   . Crohn disease (HCC)   . Diabetes mellitus without complication (HCC)   . DVT (deep venous thrombosis) (HCC) ~ 03/2016   LLE  . Elevated lactic acid level   . GERD (gastroesophageal reflux disease)   . Glaucoma   . HLD (hyperlipidemia)   . Hypertension   . Hypothyroidism   . Neuropathy (HCC)   . Pancreatitis   . Pulmonary embolus (HCC) 05/24/2014  . Thrombocytopenia (HCC)   . Tobacco abuse     Patient Active Problem List   Diagnosis Date Noted  . Altered mental status 08/11/2016  . Protein calorie malnutrition (HCC) 08/11/2016  . Diarrhea 08/11/2016  . Delirium 06/26/2016  . Pressure injury of skin 06/26/2016  . Abdominal pain, generalized   . Abdominal pain   . AKI (acute kidney injury) (HCC)   . Sepsis (HCC)   . UTI (urinary tract infection) 02/28/2016  . 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 Excoriated rash-medial thighs 02/28/2016  . DVT (deep venous  thrombosis), unspecified laterality 01/29/2016  . Bilateral tibial fractures   . Hypothyroidism   . GERD (gastroesophageal reflux disease)   . HLD (hyperlipidemia)   . Enteritis due to Clostridium difficile 03/23/2015  . Thrombocytopenia (HCC) 03/21/2015  . Weakness of back 03/21/2015  . Chronic pain 02/28/2015  . Severe protein-calorie malnutrition (HCC) 12/30/2014  . Hepatic steatosis 12/29/2014  . Chronic diarrhea of unknown origin 12/29/2014  . Elevated lactic acid level   . Dehydration 07/26/2014  . Arterial hypotension 07/26/2014  . History of pulmonary embolus (PE) 05/24/2014  . Physical deconditioning 05/14/2014  . Nicotine abuse 05/08/2014  . Anemia 10/15/2012  . Metabolic acidosis 10/14/2012  . Diabetes mellitus with complication (HCC) 05/12/2007  . Anxiety state 05/12/2007  . Depression 05/12/2007  . Peripheral neuropathy (HCC) 05/12/2007  . HTN (hypertension) 05/12/2007  . ALLERGIC RHINITIS 05/12/2007  . Asthma 05/12/2007  . PANCREATITIS, HX OF 05/12/2007    Past Surgical History:  Procedure Laterality Date  . CESAREAN SECTION  1996  . COLONOSCOPY N/A 03/02/2015   Procedure: COLONOSCOPY;  Surgeon: Bernette Redbird, MD;  Location: [facility] ENDOSCOPY;  Service: Endoscopy;  Laterality: N/A;  . ESOPHAGOGASTRODUODENOSCOPY N/A 05/21/2014   Procedure: ESOPHAGOGASTRODUODENOSCOPY (EGD);  Surgeon: Petra Kuba, MD;  Location: [facility] ENDOSCOPY;  Service: Endoscopy;  Laterality: N/A;  . ESOPHAGOGASTRODUODENOSCOPY N/A 03/02/2015   Procedure: ESOPHAGOGASTRODUODENOSCOPY (EGD);  Surgeon: Bernette Redbird, MD;  Location: [facility] ENDOSCOPY;  Service: Endoscopy;  Laterality: N/A;    OB History    No data available       Home Medications    Prior to Admission medications   Medication Sig Start Date End Date Taking? Authorizing Provider  apixaban (ELIQUIS) 5 MG TABS tablet Take 1 tablet (5 mg total) by mouth 2 (two) times daily. 05/02/16   Richarda Overlie, MD  calcium carbonate (OS-CAL - DOSED IN MG OF  ELEMENTAL CALCIUM) 1250 (500 CA) MG tablet Take 1 tablet (500 mg of elemental calcium total) by mouth daily with breakfast. 03/04/15   Alison Murray, MD  diphenoxylate-atropine (LOMOTIL) 2.5-0.025 MG tablet Take 1 tablet by mouth 4 (four) times daily as needed for diarrhea or loose stools. 07/01/16   Dorothea Ogle, MD  docusate sodium (COLACE) 100 MG capsule Take 2 capsules (200 mg total) by mouth 2 (two) times daily. Patient not taking: Reported on 07/03/2016 02/07/16   Leroy Sea, MD  feeding supplement, RESOURCE BREEZE, (RESOURCE BREEZE) LIQD Take 1 Container by mouth 3 (three) times daily between meals. Patient not taking: Reported on 07/03/2016 08/02/14   Kathlen Mody, MD  folic acid (FOLVITE) 1 MG tablet Take 1 tablet (1 mg total) by mouth daily. 03/06/16   Richarda Overlie, MD  furosemide (LASIX) 20 MG tablet Take 1 tablet (20 mg total) by mouth daily. 07/03/16   Jacalyn Lefevre, MD  gabapentin (NEURONTIN) 100 MG capsule Take 1 capsule (100 mg total) by mouth 2 (two) times daily. 03/06/16   Richarda Overlie, MD  HYDROmorphone (DILAUDID) 4 MG tablet Take 8 mg by mouth every 4 (four) hours as needed for severe pain.    Historical Provider, MD  levothyroxine (SYNTHROID, LEVOTHROID) 25 MCG tablet Take 1 tablet (25 mcg total) by mouth daily before breakfast. 03/06/16   Richarda Overlie, MD  ondansetron (ZOFRAN ODT) 4 MG disintegrating tablet Take 1 tablet (4 mg total) by mouth every 8 (eight) hours as needed for nausea or vomiting. 05/15/16   Gerhard Munch, MD  oxyCODONE-acetaminophen (PERCOCET/ROXICET) 5-325 MG tablet Take 1 tablet by mouth every 4 (four) hours as needed for moderate pain. Patient not taking: Reported on 07/03/2016 07/01/16   Dorothea Ogle, MD  pantoprazole (PROTONIX) 20 MG tablet Take 2 tablets (40 mg total) by mouth daily. 03/06/16   Richarda Overlie, MD  potassium chloride SA (K-DUR,KLOR-CON) 20 MEQ tablet Take 2 tablets (40 mEq total) by mouth daily. 05/02/16 05/22/16  Richarda Overlie, MD  potassium chloride SA  (K-DUR,KLOR-CON) 20 MEQ tablet Take 20 mEq by mouth 2 (two) times daily.    Historical Provider, MD  saccharomyces boulardii (FLORASTOR) 250 MG capsule Take 1 capsule (250 mg total) by mouth 2 (two) times daily. 04/05/15   Vassie Loll, MD  thiamine (VITAMIN B-1) 100 MG tablet Take 1 tablet (100 mg total) by mouth daily. 03/06/16   Richarda Overlie, MD  traZODone (DESYREL) 25 mg TABS tablet Take 0.5 tablets (25 mg total) by mouth at bedtime as needed for sleep. 08/02/14   Kathlen Mody, MD  venlafaxine (EFFEXOR) 25 MG tablet Take 1 tablet (25 mg total) by mouth 2 (two) times daily. 07/01/16   Dorothea Ogle, MD    Family History Family History  Problem Relation Age of Onset  . Breast cancer Mother   . Heart disease Mother 2    CABG  . Diabetes Mother   . Heart disease Father 31    CABG  . Diabetes Father  Social History Social History  Substance Use Topics  . Smoking status: Current Every Day Smoker    Packs/day: 0.25    Years: 37.00    Types: Cigarettes  . Smokeless tobacco: Never Used  . Alcohol use No     Allergies   Iohexol; Spiriva handihaler [tiotropium bromide monohydrate]; Iodinated diagnostic agents; Ativan [lorazepam]; and Penicillins   Review of Systems Review of Systems  Unable to perform ROS: Mental status change     Physical Exam Updated Vital Signs BP (!) 124/106   Pulse (!) 148   Temp 100.7 F (38.2 C) (Rectal)   Resp 21   Wt 88 lb 2.9 oz (40 kg)   LMP 10/01/2010 Comment: Has depro  SpO2 100%   BMI 14.23 kg/m   Physical Exam  Constitutional: She appears cachectic.  HENT:  Head: Normocephalic and atraumatic.  Right Ear: External ear normal.  Left Ear: External ear normal.  Nose: Nose normal.  Eyes: Right eye exhibits no discharge. Left eye exhibits no discharge.  Neck: Normal range of motion. Neck supple.  Normal active ROM of neck  Cardiovascular: Regular rhythm and normal heart sounds.  Tachycardia present.   Pulmonary/Chest: Effort normal  and breath sounds normal.  Abdominal: Soft. She exhibits no distension. There is no tenderness.  Neurological: She is alert.  Awake, alert but confused. Answers "yes" or "all over" to every question, including when not appropriate. Moves all 4 extremities spontaneously  Skin: Skin is warm and dry.  Nursing note and vitals reviewed.    ED Treatments / Results  Labs (all labs ordered are listed, but only abnormal results are displayed) Labs Reviewed  COMPREHENSIVE METABOLIC PANEL - Abnormal; Notable for the following:       Result Value   CO2 14 (*)    Creatinine, Ser 1.17 (*)    Calcium 8.1 (*)    Total Protein 5.1 (*)    Albumin 2.1 (*)    ALT 13 (*)    Total Bilirubin 1.8 (*)    GFR calc non Af Amer 51 (*)    GFR calc Af Amer 59 (*)    Anion gap 26 (*)    All other components within normal limits  CBC WITH DIFFERENTIAL/PLATELET - Abnormal; Notable for the following:    RBC 3.76 (*)    Hemoglobin 11.8 (*)    HCT 35.2 (*)    RDW 19.1 (*)    All other components within normal limits  URINALYSIS, ROUTINE W REFLEX MICROSCOPIC (NOT AT [facility]) - Abnormal; Notable for the following:    Color, Urine AMBER (*)    APPearance TURBID (*)    Hgb urine dipstick SMALL (*)    Bilirubin Urine LARGE (*)    Ketones, ur >80 (*)    Protein, ur 30 (*)    Leukocytes, UA LARGE (*)    All other components within normal limits  AMMONIA - Abnormal; Notable for the following:    Ammonia 51 (*)    All other components within normal limits  RAPID URINE DRUG SCREEN, HOSP PERFORMED - Abnormal; Notable for the following:    Opiates POSITIVE (*)    All other components within normal limits  URINE MICROSCOPIC-ADD ON - Abnormal; Notable for the following:    Bacteria, UA FEW (*)    All other components within normal limits  I-STAT CG4 LACTIC ACID, ED - Abnormal; Notable for the following:    Lactic Acid, Venous 3.12 (*)    All other components within  normal limits  I-STAT CG4 LACTIC ACID, ED -  Abnormal; Notable for the following:    Lactic Acid, Venous 2.97 (*)    All other components within normal limits  CULTURE, BLOOD (ROUTINE X 2)  CULTURE, BLOOD (ROUTINE X 2)  URINE CULTURE  VOLATILES,BLD-ACETONE,ETHANOL,ISOPROP,METHANOL  FERRITIN  IRON AND TIBC  VITAMIN B12  FOLATE RBC  TSH  RPR  SEDIMENTATION RATE  TROPONIN I  TROPONIN I  TROPONIN I  POC OCCULT BLOOD, ED    EKG  EKG Interpretation  Date/Time:  Saturday August 11 2016 19:32:12 EST Ventricular Rate:  146 PR Interval:    QRS Duration: 67 QT Interval:  314 QTC Calculation: 490 R Axis:   68 Text Interpretation:  Sinus tachycardia Ventricular premature complex Low voltage, extremity and precordial leads Anteroseptal infarct, old Borderline repolarization abnormality no significant change since May 15 2016 Confirmed by Criss AlvineGOLDSTON MD, Lahna Nath [phone]([id number]) on 08/11/2016 7:58:12 PM       Radiology Dg Chest Port 1 View  Result Date: 08/11/2016 CLINICAL DATA:  Altered mental status, asthma, diabetes and hypertension. EXAM: PORTABLE CHEST 1 VIEW COMPARISON:  06/25/2016 FINDINGS: The lungs are clear. Minimal atelectasis at the left costophrenic angle. Cardiomediastinal contours are stable and within normal limits. Old right posterior sixth rib fracture. No acute osseous abnormality. No effusion. IMPRESSION: No active disease.  Old right posterior sixth rib fracture. Electronically Signed   By: Tollie Ethavid  Kwon M.D.   On: 08/11/2016 19:02    Procedures Procedures (including critical care time)  CRITICAL CARE Performed by: Pricilla LovelessGOLDSTON, Gael Delude T   Total critical care time: 30 minutes  Critical care time was exclusive of separately billable procedures and treating other patients.  Critical care was necessary to treat or prevent imminent or life-threatening deterioration.  Critical care was time spent personally by me on the following activities: development of treatment plan with patient and/or surrogate as well as nursing,  discussions with consultants, evaluation of patient's response to treatment, examination of patient, obtaining history from patient or surrogate, ordering and performing treatments and interventions, ordering and review of laboratory studies, ordering and review of radiographic studies, pulse oximetry and re-evaluation of patient's condition.   Medications Ordered in ED Medications  levofloxacin (LEVAQUIN) IVPB 750 mg (not administered)  aztreonam (AZACTAM) 1 g in dextrose 5 % 50 mL IVPB (not administered)  vancomycin (VANCOCIN) IVPB 750 mg/150 ml premix (not administered)  sodium chloride 0.9 % bolus 800 mL (0 mL/kg  40 kg Intravenous Stopped 08/11/16 1955)  acetaminophen (TYLENOL) tablet 500 mg (500 mg Oral Given 08/11/16 1843)  levofloxacin (LEVAQUIN) IVPB 750 mg (0 mg Intravenous Stopped 08/11/16 2056)  aztreonam (AZACTAM) 2 g in dextrose 5 % 50 mL IVPB (0 g Intravenous Stopped 08/11/16 1913)  vancomycin (VANCOCIN) IVPB 1000 mg/200 mL premix (1,000 mg Intravenous New Bag/Given 08/11/16 1830)  LORazepam (ATIVAN) injection 1 mg (1 mg Intravenous Given 08/11/16 1828)  sodium chloride 0.9 % bolus 400 mL (0 mL/kg  40 kg Intravenous Stopped 08/11/16 2056)     Initial Impression / Assessment and Plan / ED Course  I have reviewed the triage vital signs and the nursing notes.  Pertinent labs & imaging results that were available during my care of the patient were reviewed by me and considered in my medical decision making (see chart for details).  Clinical Course as of Aug 11 2132  Sat Aug 11, 2016  1726 Will eval for sepsis given rectal temp of 100.6. Will likely need admission for acute AMS. History  currently very limited, will try to contact family  [SG]  2053 Urine c/w UTI. Given lactate, low grade fever, tachycardia, this is c/w sepsis but not septic shock. Fluids, antibiotics, admit to hospitalist  [SG]  2118 Dr. Selena Batten to admit, tele obs  [SG]    Clinical Course User Index [SG] Pricilla Loveless, MD    Patient's heart rate has improved with fluids and Tylenol. Patient remains confused. I believe this is from a UTI this causing sepsis. No hypotension and lactate has been under 4. No focal findings to suggest acute neurologic disease. I have tried calling family with the only number listed in the chart and no one has answered and it immediately gives a busy signal. She was given Ativan once due to repeated yelling and limiting staffs effectiveness at helping to take care of her. Otherwise has remained stable. Admit to the hospitalist.  Final Clinical Impressions(s) / ED Diagnoses   Final diagnoses:  Sepsis, due to unspecified organism [facility])  Acute UTI  Acute kidney injury [facility])    New Prescriptions New Prescriptions   No medications on file     Pricilla Loveless, MD 08/11/16 2134

## 2016-08-12 ENCOUNTER — Inpatient Hospital Stay (HOSPITAL_COMMUNITY): Payer: Medicare Other

## 2016-08-12 DIAGNOSIS — L89152 Pressure ulcer of sacral region, stage 2: Secondary | ICD-10-CM | POA: Diagnosis present

## 2016-08-12 LAB — CBC
HCT: 30.3 % — ABNORMAL LOW (ref 36.0–46.0)
Hemoglobin: 10.2 g/dL — ABNORMAL LOW (ref 12.0–15.0)
MCH: 31 pg (ref 26.0–34.0)
MCHC: 33.7 g/dL (ref 30.0–36.0)
MCV: 92.1 fL (ref 78.0–100.0)
PLATELETS: 205 10*3/uL (ref 150–400)
RBC: 3.29 MIL/uL — AB (ref 3.87–5.11)
RDW: 19.1 % — AB (ref 11.5–15.5)
WBC: 7.4 10*3/uL (ref 4.0–10.5)

## 2016-08-12 LAB — COMPREHENSIVE METABOLIC PANEL
ALT: 13 U/L — AB (ref 14–54)
AST: 38 U/L (ref 15–41)
Albumin: 1.7 g/dL — ABNORMAL LOW (ref 3.5–5.0)
Alkaline Phosphatase: 64 U/L (ref 38–126)
Anion gap: 15 (ref 5–15)
BILIRUBIN TOTAL: 1.4 mg/dL — AB (ref 0.3–1.2)
BUN: 6 mg/dL (ref 6–20)
CHLORIDE: 112 mmol/L — AB (ref 101–111)
CO2: 17 mmol/L — ABNORMAL LOW (ref 22–32)
CREATININE: 0.95 mg/dL (ref 0.44–1.00)
Calcium: 7.3 mg/dL — ABNORMAL LOW (ref 8.9–10.3)
Glucose, Bld: 59 mg/dL — ABNORMAL LOW (ref 65–99)
Potassium: 3.8 mmol/L (ref 3.5–5.1)
Sodium: 144 mmol/L (ref 135–145)
TOTAL PROTEIN: 4.4 g/dL — AB (ref 6.5–8.1)

## 2016-08-12 LAB — IRON AND TIBC
Iron: 70 ug/dL (ref 28–170)
SATURATION RATIOS: 59 % — AB (ref 10.4–31.8)
TIBC: 119 ug/dL — AB (ref 250–450)
UIBC: 49 ug/dL

## 2016-08-12 LAB — VOLATILES,BLD-ACETONE,ETHANOL,ISOPROP,METHANOL
ACETONE, BLOOD: 0.024 % — AB (ref 0.000–0.010)
Ethanol, blood: NOT DETECTED % (ref 0.000–0.010)
ISOPROPANOL, BLOOD: NOT DETECTED % (ref 0.000–0.010)
METHANOL, BLOOD: NOT DETECTED % (ref 0.000–0.010)

## 2016-08-12 LAB — C DIFFICILE QUICK SCREEN W PCR REFLEX
C DIFFICILE (CDIFF) INTERP: NOT DETECTED
C DIFFICILE (CDIFF) TOXIN: NEGATIVE
C DIFFICLE (CDIFF) ANTIGEN: NEGATIVE

## 2016-08-12 LAB — TROPONIN I
Troponin I: 0.06 ng/mL (ref <0.03)
Troponin I: 0.07 ng/mL (ref <0.03)
Troponin I: 0.08 ng/mL (ref <0.03)

## 2016-08-12 LAB — FERRITIN: Ferritin: 96 ng/mL (ref 11–307)

## 2016-08-12 LAB — TSH: TSH: 2.049 u[IU]/mL (ref 0.350–4.500)

## 2016-08-12 LAB — VITAMIN B12: VITAMIN B 12: 699 pg/mL (ref 180–914)

## 2016-08-12 LAB — RPR: RPR Ser Ql: NONREACTIVE

## 2016-08-12 LAB — SEDIMENTATION RATE: Sed Rate: 0 mm/hr (ref 0–22)

## 2016-08-12 MED ORDER — WITCH HAZEL-GLYCERIN EX PADS
MEDICATED_PAD | CUTANEOUS | Status: DC | PRN
  Filled 2016-08-12: qty 100

## 2016-08-12 MED ORDER — HYDROMORPHONE HCL 2 MG PO TABS
4.0000 mg | ORAL_TABLET | ORAL | Status: DC | PRN
  Administered 2016-08-12 – 2016-08-15 (×10): 4 mg via ORAL
  Filled 2016-08-12 (×10): qty 2

## 2016-08-12 MED ORDER — DIPHENOXYLATE-ATROPINE 2.5-0.025 MG PO TABS
1.0000 | ORAL_TABLET | Freq: Four times a day (QID) | ORAL | Status: DC | PRN
  Administered 2016-08-12: 1 via ORAL
  Filled 2016-08-12: qty 1

## 2016-08-12 MED ORDER — METOPROLOL TARTRATE 25 MG PO TABS
25.0000 mg | ORAL_TABLET | Freq: Two times a day (BID) | ORAL | Status: DC
  Administered 2016-08-12 – 2016-08-14 (×3): 25 mg via ORAL
  Filled 2016-08-12 (×6): qty 1

## 2016-08-12 MED ORDER — SODIUM CHLORIDE 0.9 % IV SOLN
INTRAVENOUS | Status: DC
  Administered 2016-08-12 – 2016-08-13 (×2): via INTRAVENOUS

## 2016-08-12 MED ORDER — ASPIRIN EC 81 MG PO TBEC
81.0000 mg | DELAYED_RELEASE_TABLET | Freq: Every day | ORAL | Status: DC
  Administered 2016-08-12 – 2016-08-15 (×4): 81 mg via ORAL
  Filled 2016-08-12 (×4): qty 1

## 2016-08-12 MED ORDER — OXYCODONE-ACETAMINOPHEN 5-325 MG PO TABS
1.0000 | ORAL_TABLET | ORAL | Status: DC | PRN
  Administered 2016-08-12 – 2016-08-15 (×8): 1 via ORAL
  Filled 2016-08-12 (×8): qty 1

## 2016-08-12 MED ORDER — LEVOFLOXACIN 500 MG PO TABS
500.0000 mg | ORAL_TABLET | ORAL | Status: DC
  Administered 2016-08-13 – 2016-08-15 (×2): 500 mg via ORAL
  Filled 2016-08-12 (×3): qty 1

## 2016-08-12 NOTE — Progress Notes (Signed)
Patient off floor to CT via bed. Alert and oriented.

## 2016-08-12 NOTE — Progress Notes (Signed)
PROGRESS NOTE  [name]  PFX:[id number] DOB: 1960-12-13 DOA: 08/11/2016 PCP: Wenda Low, MD Outpatient Specialists:  Subjective: She feels much better, denies any fever or chills. She is wide awake and alert.  Brief Narrative:  [name]  is a 55 y.o. female,  w PE/ DVT, Crohns disease, Protein calorie malnutrition, apparently presents with altered mental status.  Pt  Is unable to tell me how she arrived to the ED.   In the ED,  Pt noted to be tachycardic and to have uti as well as altered mental status.  axox2 (person, place)  CT scan brain pending.  Pt will be admitted for altered mental status secondary to uti.   Assessment & Plan:   Active Problems:   Sepsis ([place])   Altered mental status   Protein calorie malnutrition ([place])   Diarrhea   Sepsis secondary to uti   -Presented with a temperature of 100.7, heart rate of 158 and presence of UTI number consistent with sepsis. -Have evidence of hypoperfusion with lactic acid of 2.97. -Given vancomycin and aztreonam 1 in the ED, antibiotic switched to levofloxacin, she is allergic to penicillin.  UTI -Urinalysis is consistent with UTI with TNTC WBC. -Started on levofloxacin, continue at a distance antibiotics according to culture results.  AMS -Per admission note she was awake and alert to person and place, altered mental status likely disorientation. -CT scan is negative, has recurrent history of decreased level of consciousness and disorientation. -B12, ESR, ANA, RPR and TSH pending. -This is likely secondary to UTI.  PE/ DVT -Has history of extensive DVT in April 2017, 1 Eliquis, continue.  Anion gap metabolic acidosis -This is secondary to sepsis, the anion gap closed aggressive hydration with IV fluids. -Has mild acidosis with bicarbonate of 17, continue IV fluid hydration.  Protein calorie malnutrition (severe) -Albumin of 1.7 and total protein of 4.4.  Renal insufficiency Hydrate with ns iv Check cmp  in am  Diarrhea Check stool studies  Anemia fobt pending Check ferritin, iron tibc, b12, folate, tsh, esr, consider spep, upep Check cbc in am  Tachycardia -Troponin is mildly positive at 0.07, this is likely secondary to demand ischemia from sepsis and tachycardia. -We'll start low-dose beta blockers.  Psychiatric issues -She was seen before by psychiatry because of known involvement in her medical care. -She had when she was deemed incompetent.  DVT prophylaxis:  Code Status: Full Code Family Communication:  Disposition Plan:  Diet: Diet heart healthy/carb modified Room service appropriate? Yes; Fluid consistency: Thin  Consultants:   None  Procedures:   None  Antimicrobials:   None   Objective: Vitals:   08/11/16 2245 08/11/16 2321 08/12/16 0513 08/12/16 0947  BP: 109/89 101/72 103/69 (!) 101/55  Pulse: 102 87 92 92  Resp: _0 Temp:  98 F (36.7 C) 98.4 F (36.9 C) 98 F (36.7 C)  TempSrc:  Oral Oral Oral  SpO2: 100% 96%  99%  Weight:  37.3 kg (82 lb 4.8 oz)    Height:  5' 6" (1.676 m)      Intake/Output Summary (Last 24 hours) at 08/12/16 1035 Last data filed at 08/12/16 0947  Gross per 24 hour  Intake            607.5 ml  Output              300 ml  Net            307.5 ml   [employer]  08/11/16 1734 08/11/16 2321  Weight: 40 kg (88 lb 2.9 oz) 37.3 kg (82 lb 4.8 oz)    Examination: General exam: Appears calm and comfortable  Respiratory system: Clear to auscultation. Respiratory effort normal. Cardiovascular system: S1 & S2 heard, RRR. No JVD, murmurs, rubs, gallops or clicks. No pedal edema. Gastrointestinal system: Abdomen is nondistended, soft and nontender. No organomegaly or masses felt. Normal bowel sounds heard. Central nervous system: Alert and oriented. No focal neurological deficits. Extremities: Symmetric 5 x 5 power. Skin: No rashes, lesions or ulcers Psychiatry: Judgement and insight appear normal. Mood & affect  appropriate.   Data Reviewed: I have personally reviewed following labs and imaging studies  CBC:  Recent Labs Lab 08/11/16 1804 08/12/16 0553  WBC 6.6 7.4  NEUTROABS 4.2  --   HGB 11.8* 10.2*  HCT 35.2* 30.3*  MCV 93.6 92.1  PLT 218 161   Basic Metabolic Panel:  Recent Labs Lab 08/11/16 1804 08/12/16 0553  NA 145 144  K 3.7 3.8  CL 105 112*  CO2 14* 17*  GLUCOSE 76 59*  BUN 7 6  CREATININE 1.17* 0.95  CALCIUM 8.1* 7.3*   GFR: Estimated Creatinine Clearance: 38.5 mL/min (by C-G formula based on SCr of 0.95 mg/dL). Liver Function Tests:  Recent Labs Lab 08/11/16 1804 08/12/16 0553  AST 29 38  ALT 13* 13*  ALKPHOS 80 64  BILITOT 1.8* 1.4*  PROT 5.1* 4.4*  ALBUMIN 2.1* 1.7*   No results for input(s): LIPASE, AMYLASE in the last 168 hours.  Recent Labs Lab 08/11/16 1952  AMMONIA 51*   Coagulation Profile: No results for input(s): INR, PROTIME in the last 168 hours. Cardiac Enzymes:  Recent Labs Lab 08/11/16 2350 08/12/16 0553 08/12/16 0858  TROPONINI 0.08* 0.07* 0.06*   BNP (last 3 results) No results for input(s): PROBNP in the last 8760 hours. HbA1C: No results for input(s): HGBA1C in the last 72 hours. CBG: No results for input(s): GLUCAP in the last 168 hours. Lipid Profile: No results for input(s): CHOL, HDL, LDLCALC, TRIG, CHOLHDL, LDLDIRECT in the last 72 hours. Thyroid Function Tests:  Recent Labs  08/11/16 2350  TSH 2.049   Anemia Panel:  Recent Labs  08/11/16 2350  VITAMINB12 699  FERRITIN 96  TIBC 119*  IRON 70   Urine analysis:    Component Value Date/Time   COLORURINE AMBER (A) 08/11/2016 1941   APPEARANCEUR TURBID (A) 08/11/2016 1941   LABSPEC 1.020 08/11/2016 1941   PHURINE 6.0 08/11/2016 1941   GLUCOSEU NEGATIVE 08/11/2016 1941   HGBUR SMALL (A) 08/11/2016 1941   BILIRUBINUR LARGE (A) 08/11/2016 1941   KETONESUR >80 (A) 08/11/2016 1941   PROTEINUR 30 (A) 08/11/2016 1941   UROBILINOGEN 1.0 02/28/2015 1227    NITRITE NEGATIVE 08/11/2016 1941   LEUKOCYTESUR LARGE (A) 08/11/2016 1941   Sepsis Labs: _0 (procalcitonin:4,lacticidven:4)  )No results found for this or any previous visit (from the past 240 hour(s)).   Invalid input(s): PROCALCITONIN, LACTICACIDVEN   Radiology Studies: Dg Chest Port 1 View  Result Date: 08/11/2016 CLINICAL DATA:  Altered mental status, asthma, diabetes and hypertension. EXAM: PORTABLE CHEST 1 VIEW COMPARISON:  06/25/2016 FINDINGS: The lungs are clear. Minimal atelectasis at the left costophrenic angle. Cardiomediastinal contours are stable and within normal limits. Old right posterior sixth rib fracture. No acute osseous abnormality. No effusion. IMPRESSION: No active disease.  Old right posterior sixth rib fracture. Electronically Signed   By: Ashley Royalty M.D.   On: 08/11/2016 19:02  Scheduled Meds: . apixaban  5 mg Oral BID  . feeding supplement  1 Container Oral TID BM  . feeding supplement (PRO-STAT SUGAR FREE 64)  30 mL Oral BID  . folic acid  1 mg Oral Daily  . gabapentin  100 mg Oral BID  . [START ON 08/13/2016] levofloxacin (LEVAQUIN) IV  500 mg Intravenous Q48H  . levothyroxine  25 mcg Oral QAC breakfast  . pantoprazole  40 mg Oral Daily  . saccharomyces boulardii  250 mg Oral BID  . thiamine  100 mg Oral Daily  . vancomycin  750 mg Intravenous Q24H  . venlafaxine  25 mg Oral BID   Continuous Infusions: . sodium chloride 75 mL/hr at 08/11/16 2330     LOS: 1 day    Time spent: 35 minutes    Budd Freiermuth A, MD [facility] Pager [phone]  If 7PM-7AM, please contact night-coverage [email or web address] Password [email or web address] 08/12/2016, 10:35 AM

## 2016-08-12 NOTE — Progress Notes (Signed)
Patient returned back to room via bed from Ct. Alert and oriented.

## 2016-08-12 NOTE — Discharge Instructions (Addendum)

## 2016-08-12 NOTE — Progress Notes (Signed)
CRITICAL VALUE ALERT  Critical value received:  Troponin 0.08  Date of notification:  08/12/2016  Time of notification:  0105  Critical value read back:Yes.    Nurse who received alert:  Alfonse Rashristy Lakeena Downie RN  MD notified (1st page):  [place]  Time of first page:  0108  MD notified (2nd page):  Time of second page:  Responding MD:  Criselda PeachesMullen  Time MD responded:  0110   MD stated to monitor pt and next Troponin level will be drawn at 0600. Pt is asymptomatic and is sleeping.   Larey Dayshristy M Oddie Kuhlmann, RN

## 2016-08-12 NOTE — Progress Notes (Signed)
New Admission Note:  Arrival Method: Pt arrived by bed from ED around 2330 Mental Orientation: Pt is alert to self and will only respond "yes" to questions without understanding what you are asking.  Telemetry: Pt is on tele and is ST Assessment: Completed Skin: Small open wound measuring 1.5 by 1 cm on sacrum. Foam applied. No other skin issues.  Iv: Right hand and Right wrist S.L. And was wrapped with gauze prior to coming to unit d/t trying to pull them out.  Pain: Denies and does not appear to be in pain based on face pain scale.  Tubes: None Safety Measures: Pt is a high fall risk and was placed on a bed alarm, near the nurses station, yellow socks, and camera surveillance.  Admission: Unable to complete admission questions d/t altered mental status.  [address] [place] Orientation: Patient has been orientated to the room, unit and the staff. Family: None present  Orders have been reviewed and implemented. Will continue to monitor the patient. Call light has been placed within reach and bed alarm has been activated.   Alfonse Ras, RN  Phone Number: [phone]

## 2016-08-12 NOTE — Plan of Care (Signed)
Problem: Nutrition: Goal: Adequate nutrition will be maintained Outcome: Not Progressing Patient will drink Breeze supplements, but refuses all other supplements.

## 2016-08-12 NOTE — Progress Notes (Signed)
Called re: troponin in [name].   Troponin trend 0.03 --> 0.08.   She was admitted for likely sepsis 2/2 UTI.  HR has been elevated in the 130s - 150s on admission.  She now has HR in the 110s.  BP is well controlled.  She is resting and is not complaining of CP per nursing.  She has mild AKI.  I believe this slight bump in her Trop is likely rate related given lack of symptoms and tachycardia.  Will make sure an EKG is ordered for the morning and trend troponin again at 6am.  If continues to trend up, or EKG changes, consider heparin gtt and Cardiology consult.   Debe Coder, MD

## 2016-08-13 DIAGNOSIS — E872 Acidosis: Secondary | ICD-10-CM

## 2016-08-13 DIAGNOSIS — L89152 Pressure ulcer of sacral region, stage 2: Secondary | ICD-10-CM

## 2016-08-13 LAB — COMPREHENSIVE METABOLIC PANEL
ALBUMIN: 1.5 g/dL — AB (ref 3.5–5.0)
ALK PHOS: 48 U/L (ref 38–126)
ALT: 13 U/L — ABNORMAL LOW (ref 14–54)
ANION GAP: 7 (ref 5–15)
AST: 34 U/L (ref 15–41)
CALCIUM: 7 mg/dL — AB (ref 8.9–10.3)
CO2: 22 mmol/L (ref 22–32)
Chloride: 117 mmol/L — ABNORMAL HIGH (ref 101–111)
Creatinine, Ser: 0.61 mg/dL (ref 0.44–1.00)
GFR calc Af Amer: >60 mL/min (ref >60)
GFR calc non Af Amer: >60 mL/min (ref >60)
GLUCOSE: 77 mg/dL (ref 65–99)
Potassium: 2.6 mmol/L — CL (ref 3.5–5.1)
SODIUM: 146 mmol/L — AB (ref 135–145)
Total Bilirubin: 0.8 mg/dL (ref 0.3–1.2)
Total Protein: 3.6 g/dL — ABNORMAL LOW (ref 6.5–8.1)

## 2016-08-13 LAB — FOLATE RBC
FOLATE, RBC: 764 ng/mL (ref >498)
Folate, Hemolysate: 258.2 ng/mL
HEMATOCRIT: 33.8 % — AB (ref 34.0–46.6)

## 2016-08-13 LAB — CBC
HEMATOCRIT: 26.9 % — AB (ref 36.0–46.0)
HEMOGLOBIN: 8.9 g/dL — AB (ref 12.0–15.0)
MCH: 30.2 pg (ref 26.0–34.0)
MCHC: 33.1 g/dL (ref 30.0–36.0)
MCV: 91.2 fL (ref 78.0–100.0)
Platelets: 185 10*3/uL (ref 150–400)
RBC: 2.95 MIL/uL — ABNORMAL LOW (ref 3.87–5.11)
RDW: 18.7 % — AB (ref 11.5–15.5)
WBC: 6.5 10*3/uL (ref 4.0–10.5)

## 2016-08-13 LAB — GLUCOSE, CAPILLARY: GLUCOSE-CAPILLARY: 75 mg/dL (ref 65–99)

## 2016-08-13 MED ORDER — POTASSIUM CHLORIDE 20 MEQ/15ML (10%) PO SOLN
40.0000 meq | Freq: Once | ORAL | Status: AC
  Administered 2016-08-13: 40 meq via ORAL
  Filled 2016-08-13: qty 30

## 2016-08-13 MED ORDER — SODIUM CHLORIDE 0.9 % IV SOLN: INTRAVENOUS | Status: DC

## 2016-08-13 MED ORDER — POTASSIUM CHLORIDE 20 MEQ/15ML (10%) PO SOLN
40.0000 meq | Freq: Four times a day (QID) | ORAL | Status: AC
  Administered 2016-08-13 (×3): 40 meq via ORAL
  Filled 2016-08-13 (×3): qty 30

## 2016-08-13 MED ORDER — POTASSIUM CHLORIDE 20 MEQ/15ML (10%) PO SOLN: 40.0000 meq | Freq: Once | ORAL | Status: DC

## 2016-08-13 MED ORDER — POTASSIUM CHLORIDE CRYS ER 20 MEQ PO TBCR
40.0000 meq | EXTENDED_RELEASE_TABLET | Freq: Once | ORAL | Status: DC
  Filled 2016-08-13: qty 2

## 2016-08-13 MED ORDER — POTASSIUM CHLORIDE IN NACL 20-0.9 MEQ/L-% IV SOLN
INTRAVENOUS | Status: DC
  Administered 2016-08-13: 09:00:00 via INTRAVENOUS
  Administered 2016-08-14: 1000 mL via INTRAVENOUS
  Filled 2016-08-13 (×2): qty 1000

## 2016-08-13 MED ORDER — MAGNESIUM SULFATE 2 GM/50ML IV SOLN
2.0000 g | Freq: Four times a day (QID) | INTRAVENOUS | Status: AC
  Administered 2016-08-13 (×2): 2 g via INTRAVENOUS
  Filled 2016-08-13 (×2): qty 50

## 2016-08-13 NOTE — Evaluation (Signed)
Physical Therapy Evaluation Patient Details Name: [name] MRN: [id number] DOB: 1961-01-19 Today's Date: 08/13/2016   History of Present Illness  Taleasha Luttrull  is a 55 y.o. female,  w PE/ DVT, Crohns disease, Protein calorie malnutrition, apparently presents with altered mental status.  Pt  Is unable to tell me how she arrived to the ED.    Clinical Impression  Pt admitted with/for AMS.  Pt currently limited functionally due to the problems listed. ( See problems list.)   Pt will benefit from PT to maximize function and safety in order to get ready for next venue listed below.     Follow Up Recommendations SNF (will likely refuse given she has 7 hr PCA a day)    Equipment Recommendations  None recommended by PT    Recommendations for Other Services       Precautions / Restrictions Precautions Precautions: Fall      Mobility  Bed Mobility Overal bed mobility: Needs Assistance Bed Mobility: Supine to Sit;Sit to Supine     Supine to sit: Min guard;HOB elevated (HOB totally elevated) Sit to supine: Min assist   General bed mobility comments: pt wouldn't get up without HOB raised.  Scooted herself to the EOB  Transfers                 General transfer comment: pt refused to stand or get OOB  Ambulation/Gait                Stairs            Wheelchair Mobility    Modified Rankin (Stroke Patients Only)       Balance Overall balance assessment: Needs assistance   Sitting balance-Leahy Scale: Fair                                       Pertinent Vitals/Pain Pain Assessment: No/denies pain    Home Living Family/patient expects to be discharged to:: Unsure Living Arrangements: Children Available Help at Discharge: Family;Personal care attendant;Available PRN/intermittently Type of Home: Apartment Home Access: Level entry     Home Layout: One level Home Equipment: Walker - 2 wheels;Cane - single point;Hospital bed;Tub  bench;Wheelchair - manual Additional Comments: PCA 7 hrs weekdays and 2 hrs weekends    Prior Function Level of Independence: Needs assistance   Gait / Transfers Assistance Needed: pt states she walks with aides assist.  Pt doesn't like using the RW           Hand Dominance   Dominant Hand: Left    Extremity/Trunk Assessment   Upper Extremity Assessment: Generalized weakness           Lower Extremity Assessment: Generalized weakness         Communication   Communication: No difficulties  Cognition Arousal/Alertness: Awake/alert Behavior During Therapy: WFL for tasks assessed/performed Overall Cognitive Status: Difficult to assess                      General Comments General comments (skin integrity, edema, etc.): pt had to be maximally encouraged to participate, then reluctantly got to EOB and refused to do further mobility.    Exercises     Assessment/Plan    PT Assessment Patient needs continued PT services  PT Problem List Decreased strength;Decreased activity tolerance;Decreased balance;Decreased mobility;Decreased knowledge of use of DME  PT Treatment Interventions      PT Goals (Current goals can be found in the Care Plan section)  Acute Rehab PT Goals Patient Stated Goal: pt didn't state PT Goal Formulation: With patient Time For Goal Achievement: 08/20/16 Potential to Achieve Goals: Fair    Frequency Min 2X/week   Barriers to discharge        Co-evaluation               End of Session   Activity Tolerance: Other (comment) (limited evaluation) Patient left: in bed;with call bell/phone within reach;with family/visitor present Nurse Communication: Mobility status         Time: 1700-1719 PT Time Calculation (min) (ACUTE ONLY): 19 min   Charges:   PT Evaluation $PT Eval Low Complexity: 1 Procedure     PT G CodesEliseo Gum:        Shakthi Scipio V Remona Boom 08/13/2016, 5:52 PM 08/13/2016  [place] BingKen Sherrita Riederer,  PT [phone]  (pager)

## 2016-08-13 NOTE — Progress Notes (Signed)
PROGRESS NOTE  [name]  FTD:[id number] DOB: May 09, 1961 DOA: 08/11/2016 PCP: Wenda Low, MD Outpatient Specialists:  Subjective: Appears and feels much better than yesterday, sitting up trying to eat her breakfast this morning. Potassium is low, diarrhea resolved. PT/OT to evaluate  Brief Narrative:  [name]  is a 55 y.o. female,  w PE/ DVT, Crohns disease, Protein calorie malnutrition, apparently presents with altered mental status.  Pt  Is unable to tell me how she arrived to the ED.   In the ED,  Pt noted to be tachycardic and to have uti as well as altered mental status.  axox2 (person, place)  CT scan brain pending.  Pt will be admitted for altered mental status secondary to uti.   Assessment & Plan:   Active Problems:   Metabolic acidosis   Severe protein-calorie malnutrition (HCC)   Sepsis ([place])   Altered mental status   Protein calorie malnutrition ([place])   Diarrhea   Sacral decubitus ulcer, stage II   Sepsis secondary to UTI   -Presented with a temperature of 100.7, heart rate of 158 and presence of UTI number consistent with sepsis. -Have evidence of hypoperfusion with lactic acid of 2.97. -Given vancomycin and aztreonam 1 in the ED, antibiotic switched to levofloxacin, she is allergic to penicillin. -Urine culture pending, continue current antibiotics.  UTI -Urinalysis is consistent with UTI with TNTC WBC. -Started on levofloxacin, continue at a distance antibiotics according to culture results.  Hypokalemia -Severe hypokalemia of 2.6, will replete with oral and IV supplements. -Given magnesium, check BMP and magnesium level in a.m.  AMS -Per admission note she was awake and alert to person and place, altered mental status likely disorientation. -CT scan is negative, has recurrent history of decreased level of consciousness and disorientation. -B12, ESR, ANA, RPR and TSH pending. -This is likely secondary to UTI. This is resolved.  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  Anion gap metabolic acidosis -This is secondary to sepsis, the anion gap closed aggressive hydration with IV fluids. -Has mild acidosis with bicarbonate of 17, continue IV fluid hydration.  Protein calorie malnutrition (severe) -Albumin of 1.5 and total protein of 4.4.  Renal insufficiency Hydrate with ns iv Check cmp in am  Diarrhea -Negative for C. difficile, patient reported having diarrhea every now and then.  Anemia fobt pending Check ferritin, iron tibc, b12, folate, tsh, esr, consider spep, upep Check cbc in am  Tachycardia -Troponin is mildly positive at 0.07, this is likely secondary to demand ischemia from sepsis and tachycardia. -We'll start low-dose beta blockers.  Psychiatric issues -She was seen before by psychiatry because of she does not want to involve in her medical care. -She had when she was deemed incompetent.  Stage II sacral decubitus ulcer -Present on admission, on to be hard to heal as her albumin is 1.5.  DVT prophylaxis:  Code Status: Full Code Family Communication:  Disposition Plan:  Diet: Diet heart healthy/carb modified Room service appropriate? Yes; Fluid consistency: Thin  Consultants:   None  Procedures:   None  Antimicrobials:   None   Objective: Vitals:   08/12/16 1828 08/12/16 2148 08/12/16 2200 08/13/16 0640  BP: 108/62 (!) 86/65  93/61  Pulse: 90 66  85  Resp: _0 Temp: 98.2 F (36.8 C) 98.5 F (36.9 C)  98.7 F (37.1 C)  TempSrc: Oral Oral  Oral  SpO2: 99% 100%  100%  Weight:  37.3 kg (82 lb 3.7 oz)   Height:        Intake/Output Summary (Last 24 hours) at 08/13/16 1044 Last data filed at 08/13/16 0844  Gross per 24 hour  Intake           1852.5 ml  Output              334 ml  Net           1518.5 ml   Filed Weights   08/11/16 1734 08/11/16 2321 08/12/16 2200  Weight: 40 kg (88 lb 2.9 oz) 37.3 kg (82 lb 4.8 oz) 37.3 kg (82 lb  3.7 oz)    Examination: General exam: Appears calm and comfortable  Respiratory system: Clear to auscultation. Respiratory effort normal. Cardiovascular system: S1 & S2 heard, RRR. No JVD, murmurs, rubs, gallops or clicks. No pedal edema. Gastrointestinal system: Abdomen is nondistended, soft and nontender. No organomegaly or masses felt. Normal bowel sounds heard. Central nervous system: Alert and oriented. No focal neurological deficits. Extremities: Symmetric 5 x 5 power. Skin: No rashes, lesions or ulcers Psychiatry: Judgement and insight appear normal. Mood & affect appropriate.   Data Reviewed: I have personally reviewed following labs and imaging studies  CBC:  Recent Labs Lab 08/11/16 1804 08/12/16 0553 08/13/16 0440  WBC 6.6 7.4 6.5  NEUTROABS 4.2  --   --   HGB 11.8* 10.2* 8.9*  HCT 35.2* 30.3* 26.9*  MCV 93.6 92.1 91.2  PLT 218 205 751   Basic Metabolic Panel:  Recent Labs Lab 08/11/16 1804 08/12/16 0553 08/13/16 0440  NA 145 144 146*  K 3.7 3.8 2.6*  CL 105 112* 117*  CO2 14* 17* 22  GLUCOSE 76 59* 77  BUN 7 6 <5*  CREATININE 1.17* 0.95 0.61  CALCIUM 8.1* 7.3* 7.0*   GFR: Estimated Creatinine Clearance: 45.7 mL/min (by C-G formula based on SCr of 0.61 mg/dL). Liver Function Tests:  Recent Labs Lab 08/11/16 1804 08/12/16 0553 08/13/16 0440  AST 29 38 34  ALT 13* 13* 13*  ALKPHOS 80 64 48  BILITOT 1.8* 1.4* 0.8  PROT 5.1* 4.4* 3.6*  ALBUMIN 2.1* 1.7* 1.5*   No results for input(s): LIPASE, AMYLASE in the last 168 hours.  Recent Labs Lab 08/11/16 1952  AMMONIA 51*   Coagulation Profile: No results for input(s): INR, PROTIME in the last 168 hours. Cardiac Enzymes:  Recent Labs Lab 08/11/16 2350 08/12/16 0553 08/12/16 0858  TROPONINI 0.08* 0.07* 0.06*   BNP (last 3 results) No results for input(s): PROBNP in the last 8760 hours. HbA1C: No results for input(s): HGBA1C in the last 72 hours. CBG: No results for input(s): GLUCAP  in the last 168 hours. Lipid Profile: No results for input(s): CHOL, HDL, LDLCALC, TRIG, CHOLHDL, LDLDIRECT in the last 72 hours. Thyroid Function Tests:  Recent Labs  08/11/16 2350  TSH 2.049   Anemia Panel:  Recent Labs  08/11/16 2350  VITAMINB12 699  FERRITIN 96  TIBC 119*  IRON 70   Urine analysis:    Component Value Date/Time   COLORURINE AMBER (A) 08/11/2016 1941   APPEARANCEUR TURBID (A) 08/11/2016 1941   LABSPEC 1.020 08/11/2016 1941   PHURINE 6.0 08/11/2016 1941   GLUCOSEU NEGATIVE 08/11/2016 1941   HGBUR SMALL (A) 08/11/2016 1941   BILIRUBINUR LARGE (A) 08/11/2016 1941   KETONESUR >80 (A) 08/11/2016 1941   PROTEINUR 30 (A) 08/11/2016 1941   UROBILINOGEN 1.0 02/28/2015 1227   NITRITE NEGATIVE 08/11/2016 1941  LEUKOCYTESUR LARGE (A) 08/11/2016 1941   Sepsis Labs: _0 (procalcitonin:4,lacticidven:4)  ) Recent Results (from the past 240 hour(s))  Blood Culture (routine x 2)     Status: None (Preliminary result)   Collection Time: 08/11/16  6:01 PM  Result Value Ref Range Status   Specimen Description BLOOD RIGHT ANTECUBITAL  Final   Special Requests IN PEDIATRIC BOTTLE 3CC  Final   Culture NO GROWTH < 24 HOURS  Final   Report Status PENDING  Incomplete  Blood Culture (routine x 2)     Status: None (Preliminary result)   Collection Time: 08/11/16  6:05 PM  Result Value Ref Range Status   Specimen Description BLOOD RIGHT HAND  Final   Special Requests IN PEDIATRIC BOTTLE 2CC  Final   Culture NO GROWTH < 24 HOURS  Final   Report Status PENDING  Incomplete  Urine culture     Status: None (Preliminary result)   Collection Time: 08/11/16  7:41 PM  Result Value Ref Range Status   Specimen Description URINE, RANDOM  Final   Special Requests NONE  Final   Culture CULTURE REINCUBATED FOR BETTER GROWTH  Final   Report Status PENDING  Incomplete  C difficile quick scan w PCR reflex     Status: None   Collection Time: 08/11/16 11:24 PM  Result Value Ref  Range Status   C Diff antigen NEGATIVE NEGATIVE Final   C Diff toxin NEGATIVE NEGATIVE Final   C Diff interpretation No C. difficile detected.  Final     Invalid input(s): PROCALCITONIN, LACTICACIDVEN   Radiology Studies: Ct Head Wo Contrast  Result Date: 08/12/2016 CLINICAL DATA:  Altered mental status EXAM: CT HEAD WITHOUT CONTRAST TECHNIQUE: Contiguous axial images were obtained from the base of the skull through the vertex without intravenous contrast. COMPARISON:  CT head 06/26/2016 FINDINGS: Brain: Moderate atrophy. Mild chronic ischemic change in the white matter. Negative for acute infarct, hemorrhage, or mass lesion. No change from the prior CT Vascular: Extensive carotid calcification and vertebral calcification bilaterally. Negative for dense MCA Skull: Negative Sinuses/Orbits: Negative Other: None IMPRESSION: Generalized atrophy.  No acute abnormality. Electronically Signed   By: Franchot Gallo M.D.   On: 08/12/2016 20:40   Dg Chest Port 1 View  Result Date: 08/11/2016 CLINICAL DATA:  Altered mental status, asthma, diabetes and hypertension. EXAM: PORTABLE CHEST 1 VIEW COMPARISON:  06/25/2016 FINDINGS: The lungs are clear. Minimal atelectasis at the left costophrenic angle. Cardiomediastinal contours are stable and within normal limits. Old right posterior sixth rib fracture. No acute osseous abnormality. No effusion. IMPRESSION: No active disease.  Old right posterior sixth rib fracture. Electronically Signed   By: Ashley Royalty M.D.   On: 08/11/2016 19:02        Scheduled Meds: . apixaban  5 mg Oral BID  . aspirin EC  81 mg Oral Daily  . feeding supplement  1 Container Oral TID BM  . feeding supplement (PRO-STAT SUGAR FREE 64)  30 mL Oral BID  . folic acid  1 mg Oral Daily  . gabapentin  100 mg Oral BID  . levofloxacin  500 mg Oral Q48H  . levothyroxine  25 mcg Oral QAC breakfast  . magnesium sulfate 1 - 4 g bolus IVPB  2 g Intravenous Q6H  . metoprolol tartrate  25 mg  Oral BID  . pantoprazole  40 mg Oral Daily  . potassium chloride  40 mEq Oral Q6H  . potassium chloride  40 mEq Oral Once  . saccharomyces  boulardii  250 mg Oral BID  . thiamine  100 mg Oral Daily  . venlafaxine  25 mg Oral BID   Continuous Infusions: . 0.9 % NaCl with KCl 20 mEq / L 75 mL/hr at 08/13/16 0842     LOS: 2 days    Time spent: 35 minutes    Jule Whitsel A, MD [facility] Pager [phone]  If 7PM-7AM, please contact night-coverage [email or web address] Password [facility] 08/13/2016, 10:44 AM

## 2016-08-13 NOTE — Progress Notes (Signed)
Dr. Clearence Ped paged to notify patient unable to urinate; bladder scan showing . Awaiting response.

## 2016-08-13 NOTE — Progress Notes (Signed)
PT Cancellation Note  Patient Details Name: [name] MRN: [id number] DOB: 07-Apr-1961   Cancelled Treatment:    Reason Eval/Treat Not Completed: Patient declined, no reason specified.  I'm napping good, come back.  Will return as able. 08/13/2016  [place] Bing, PT [phone]  (pager)    Levin Dagostino, Eliseo Gum 08/13/2016, 11:28 AM

## 2016-08-13 NOTE — Progress Notes (Signed)
Initial Nutrition Assessment  DOCUMENTATION CODES:   Underweight  INTERVENTION:  Continue 30 ml Prostat po BID, each supplement provides 100 kcal and 15 grams of protein.   Continue Boost Breeze po TID, each supplement provides 250 kcal and 9 grams of protein.  Encourage adequate PO intake.   NUTRITION DIAGNOSIS:   Inadequate oral intake related to lethargy/confusion, acute illness as evidenced by meal completion < 50%.  GOAL:   Patient will meet greater than or equal to 90% of their needs  MONITOR:   PO intake, Supplement acceptance, Labs, Weight trends, Skin, I & O's  REASON FOR ASSESSMENT:   Malnutrition Screening Tool    ASSESSMENT:   55 y.o. female,  w PE/ DVT, Crohns disease, Protein calorie malnutrition, apparently presents with altered mental status. Pt will be admitted for altered mental status secondary to uti.   Pt was asleep during time of visit. No family at bedside. RD unable to obtain most recent nutrition history. Meal completion has been 25-50%. Pt with weight loss. Per weight records, pt with a 6.8% weight loss in 1 month. Pt currently has Boost Breeze and Prostat ordered and has been consuming most of them. RD to continue with current orders. Unable to complete Nutrition-Focused physical exam at this time. RD to perform at next visit.   Labs and medications reviewed. Sodium elevated at 146. Potassium low at 2.6.  Diet Order:  Diet heart healthy/carb modified Room service appropriate? Yes; Fluid consistency: Thin  Skin:  Wound (see comment) (wound on sacrum)  Last BM:  11/12  Height:   Ht Readings from Last 1 Encounters:  08/11/16 5\' 6"  (1.676 m)    Weight:   Wt Readings from Last 1 Encounters:  08/12/16 82 lb 3.7 oz (37.3 kg)    Ideal Body Weight:  59 kg  BMI:  Body mass index is 13.27 kg/m.  Estimated Nutritional Needs:   Kcal:  1400-1600  Protein:  60-70 grams  Fluid:  >/= 1.5 L/day  EDUCATION NEEDS:   No education needs  identified at this time  Roslyn Smiling, MS, RD, LDN Pager # [phone] After hours/ weekend pager # [phone]

## 2016-08-13 NOTE — Progress Notes (Signed)
Dr. Clearence Ped paged to notify of critical lab value K+ 2.6; 3.8 yesterday morning. Awaiting response

## 2016-08-14 ENCOUNTER — Inpatient Hospital Stay (HOSPITAL_COMMUNITY): Payer: Medicare Other

## 2016-08-14 DIAGNOSIS — R509 Fever, unspecified: Secondary | ICD-10-CM

## 2016-08-14 LAB — BASIC METABOLIC PANEL
ANION GAP: 5 (ref 5–15)
ANION GAP: 8 (ref 5–15)
ANION GAP: 8 (ref 5–15)
BUN: <5 mg/dL — ABNORMAL LOW (ref 6–20)
BUN: <5 mg/dL — ABNORMAL LOW (ref 6–20)
BUN: <5 mg/dL — ABNORMAL LOW (ref 6–20)
CALCIUM: 7.5 mg/dL — AB (ref 8.9–10.3)
CALCIUM: 8.1 mg/dL — AB (ref 8.9–10.3)
CHLORIDE: 123 mmol/L — AB (ref 101–111)
CHLORIDE: 125 mmol/L — AB (ref 101–111)
CHLORIDE: 116 mmol/L — AB (ref 101–111)
CO2: 17 mmol/L — AB (ref 22–32)
CO2: 11 mmol/L — AB (ref 22–32)
CO2: 18 mmol/L — AB (ref 22–32)
CREATININE: 0.59 mg/dL (ref 0.44–1.00)
Calcium: 7.7 mg/dL — ABNORMAL LOW (ref 8.9–10.3)
Creatinine, Ser: 0.62 mg/dL (ref 0.44–1.00)
Creatinine, Ser: 0.59 mg/dL (ref 0.44–1.00)
GFR calc non Af Amer: >60 mL/min (ref >60)
GFR calc non Af Amer: >60 mL/min (ref >60)
GFR calc non Af Amer: >60 mL/min (ref >60)
GLUCOSE: 169 mg/dL — AB (ref 65–99)
GLUCOSE: 176 mg/dL — AB (ref 65–99)
GLUCOSE: 173 mg/dL — AB (ref 65–99)
Potassium: 7 mmol/L (ref 3.5–5.1)
Potassium: 6.4 mmol/L (ref 3.5–5.1)
Potassium: 4.4 mmol/L (ref 3.5–5.1)
Sodium: 145 mmol/L (ref 135–145)
Sodium: 144 mmol/L (ref 135–145)
Sodium: 142 mmol/L (ref 135–145)

## 2016-08-14 LAB — ECHOCARDIOGRAM COMPLETE
HEIGHTINCHES: 66 in
WEIGHTICAEL: 1377.6 [oz_av]

## 2016-08-14 LAB — URINE CULTURE: Culture: >=100000 — AB

## 2016-08-14 LAB — MAGNESIUM: Magnesium: 2.5 mg/dL — ABNORMAL HIGH (ref 1.7–2.4)

## 2016-08-14 MED ORDER — SODIUM BICARBONATE 8.4 % IV SOLN
INTRAVENOUS | Status: DC
  Administered 2016-08-14 – 2016-08-15 (×2): via INTRAVENOUS
  Filled 2016-08-14 (×4): qty 1000

## 2016-08-14 MED ORDER — INSULIN ASPART 100 UNIT/ML IV SOLN
10.0000 [IU] | Freq: Once | INTRAVENOUS | Status: AC
  Administered 2016-08-14: 10 [IU] via INTRAVENOUS

## 2016-08-14 MED ORDER — SODIUM POLYSTYRENE SULFONATE 15 GM/60ML PO SUSP: 15.0000 g | Freq: Once | ORAL | Status: DC

## 2016-08-14 MED ORDER — SODIUM CHLORIDE 0.9 % IV SOLN
INTRAVENOUS | Status: DC
  Administered 2016-08-14: 08:00:00 via INTRAVENOUS

## 2016-08-14 MED ORDER — SODIUM POLYSTYRENE SULFONATE 15 GM/60ML PO SUSP
30.0000 g | Freq: Once | ORAL | Status: AC
  Administered 2016-08-14: 30 g via ORAL
  Filled 2016-08-14: qty 120

## 2016-08-14 MED ORDER — DEXTROSE 50 % IV SOLN
1.0000 | Freq: Once | INTRAVENOUS | Status: AC
  Administered 2016-08-14: 50 mL via INTRAVENOUS
  Filled 2016-08-14: qty 50

## 2016-08-14 MED ORDER — DEXTROSE 10 % IV SOLN
Freq: Once | INTRAVENOUS | Status: AC
  Administered 2016-08-14: 10:00:00 via INTRAVENOUS

## 2016-08-14 MED ORDER — SODIUM CHLORIDE 0.9 % IV SOLN
1.0000 g | Freq: Once | INTRAVENOUS | Status: AC
  Administered 2016-08-14: 1 g via INTRAVENOUS
  Filled 2016-08-14: qty 10

## 2016-08-14 NOTE — Significant Event (Signed)
Potassium ordered @ 14:00 and cancelled by the lab (they put the reason as duplicate). After I called the lab for 3 time finally got the potassium drawn at 17:03 and reported at 17:26. Potassium is 4.4 and HCO3 is 18, continue bicarb drip likely pt can be D/C in AM.  Clint LippsMutaz A Genny Caulder Pager: [phone] 08/14/2016, 5:37 PM

## 2016-08-14 NOTE — Progress Notes (Signed)
  Echocardiogram 2D Echocardiogram has been performed.  [name] 08/14/2016, 2:19 PM

## 2016-08-14 NOTE — Progress Notes (Signed)
Nutrition Follow-up  DOCUMENTATION CODES:   Severe malnutrition in context of chronic illness, Underweight   Pt meets criteria for severe MALNUTRITION in the context of chronic illness as evidenced by a 8.5% weight loss in 3 months and severe fat and muscle mass loss.  INTERVENTION:  Continue 30 ml Prostat po BID, each supplement provides 100 kcal and 15 grams of protein.   Continue Boost Breeze po TID, each supplement provides 250 kcal and 9 grams of protein.  Encourage adequate PO intake.   NUTRITION DIAGNOSIS:   Inadequate oral intake related to lethargy/confusion, acute illness as evidenced by meal completion < 50%; improved  GOAL:   Patient will meet greater than or equal to 90% of their needs; met  MONITOR:   PO intake, Supplement acceptance, Labs, Weight trends, Skin, I & O's  REASON FOR ASSESSMENT:   Malnutrition Screening Tool    ASSESSMENT:   55 y.o. female,  w PE/ DVT, Crohns disease, Protein calorie malnutrition, apparently presents with altered mental status. Pt will be admitted for altered mental status secondary to uti.    Meal completion has been 86-100%. Pt reports her appetite has improved. Pt reports usually consuming at least 2 meals a day with Boost Breeze supplement drinks at least 2-3 times daily. Usual body weight reported to be 85-90 lbs. Per weight records, pt with a 8.5% weight loss in 3 months.   Nutrition-Focused physical exam completed. Findings are severe fat depletion, severe muscle depletion, and no edema.   Labs and medications reviewed. Potassium elevated at 6.4.  Diet Order:  Diet heart healthy/carb modified Room service appropriate? Yes; Fluid consistency: Thin  Skin:  Wound (see comment) (wound on sacrum)  Last BM:  11/13  Height:   Ht Readings from Last 1 Encounters:  08/11/16 5' 6"  (1.676 m)    Weight:   Wt Readings from Last 1 Encounters:  08/13/16 86 lb 1.6 oz (39.1 kg)    Ideal Body Weight:  59 kg  BMI:  Body  mass index is 13.9 kg/m.  Estimated Nutritional Needs:   Kcal:  1400-1600  Protein:  60-70 grams  Fluid:  >/= 1.5 L/day  EDUCATION NEEDS:   No education needs identified at this time  Corrin Parker, MS, RD, LDN Pager # [phone] After hours/ weekend pager # [phone]

## 2016-08-14 NOTE — Consult Note (Signed)
   [facility] Inpatient Consult   08/14/2016  [name] [birth year] [id number]  Patient was evaluated for [facility] Care Management services for frequent admissions and with HX of severe malnutrition.  HX with [facility] Care Management with difficulty to maintain contact.  Came by to see the patient and she was receiving nursing care bath and clean [place]. Will follow up for progress.  Made inpatient RNCM aware of [facility] Care Management eligibility.     For questions, please contact: Charlesetta Shanks, RN BSN CCM [facility]  [phone] business mobile phone Toll free office [phone]

## 2016-08-14 NOTE — Progress Notes (Addendum)
BMP was ordered at 2pm, Phlebotomy clarified order with Nurse as phlebotomy thought it was a duplicate. Nurse told phlebotomist it needed to be drawn and the order was correct and explained how we were monitoring patients potassium.

## 2016-08-14 NOTE — Progress Notes (Signed)
PROGRESS NOTE  [name]  JKD:[id number] DOB: September 19, 1961 DOA: 08/11/2016 PCP: Wenda Low, MD Outpatient Specialists:  Subjective: Feels much better, potassium reported to be 7.0, no EKG changes, recheck was 6.4. Received insulin, calcium gluconate and Kayexalate, check potassium at 2 PM. If potassium is normal, can be discharged later today versus discharge in AM  Brief Narrative:  [name]  is a 55 y.o. female,  w PE/ DVT, Crohns disease, Protein calorie malnutrition, apparently presents with altered mental status.  Pt  Is unable to tell me how she arrived to the ED.   In the ED,  Pt noted to be tachycardic and to have uti as well as altered mental status.  axox2 (person, place)  CT scan brain pending.  Pt will be admitted for altered mental status secondary to uti.   Assessment & Plan:   Active Problems:   Metabolic acidosis   Severe protein-calorie malnutrition (HCC)   Sepsis ([place])   Altered mental status   Protein calorie malnutrition ([place])   Diarrhea   Sacral decubitus ulcer, stage II   Sepsis secondary to UTI   -Presented with a temperature of 100.7, heart rate of 158 and presence of UTI  consistent with sepsis. -Had evidence of hypoperfusion with lactic acid of 2.97. -Given vancomycin and aztreonam 1 in the ED, antibiotic switched to levofloxacin, she is allergic to penicillin. -Urine culture pending, continue current antibiotics.  Hyperkalemia -Patient has severe hypokalemia significant 2.6, given potassium supplements orally and parenterally. -Home which is potassium level of 7 this morning, given calcium gluconate, insulin and Kayexalate.  Enterococcus faecalis UTI -Urinalysis is consistent with UTI with TNTC WBC. -Started on levofloxacin, continue at a distance antibiotics according to culture results.  Hypokalemia -Severe hypokalemia of 2.6, will replete with oral and IV supplements. -Given magnesium, check BMP and magnesium level in  a.m.  AMS -Per admission note she was awake and alert to person and place, but disoriented. -CT scan is negative, has recurrent history of decreased level of consciousness and disorientation. -B12, ESR and TSH WNL, RPR and ANA negative. -This is likely secondary to UTI. This is resolved.  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  Anion gap metabolic acidosis -This is secondary to sepsis, the anion gap closed aggressive hydration with IV fluids. -This is worsened with bicarbonate of 11, will place on D5W and bicarbonate drip.  -Now this is hyperchloremic metabolic acidosis, cannot rule out diarrhea as a cause. -Check BMP in a.m.  Protein calorie malnutrition (severe) -Albumin of 1.5 and total protein of 4.4.  Acute kidney injury -Creatinine was 1.17 on admission, this is improved with IV fluid hydration, resolved, creatinine 0.6.  Diarrhea -Negative for C. difficile, patient reported having diarrhea every now and then.  Anemia fobt pending Check ferritin, iron tibc, b12, folate, tsh, esr, consider spep, upep Check cbc in am  Tachycardia -Troponin is mildly positive at 0.07, this is likely secondary to demand ischemia from sepsis and tachycardia. -We'll start low-dose beta blockers.  Psychiatric issues -She was seen before by psychiatry because of she does not want to involve in her medical care. -She had when she was deemed incompetent.  Stage II sacral decubitus ulcer -Present on admission, on to be hard to heal as her albumin is 1.5.  DVT prophylaxis:  Code Status: Full Code Family Communication:  Disposition Plan:  Diet: Diet heart healthy/carb modified Room service appropriate? Yes; Fluid consistency: Thin  Consultants:   None  Procedures:   None  Antimicrobials:   None   Objective: Vitals:   08/13/16 1759 08/13/16 2106 08/14/16 0459 08/14/16 1000  BP: 111/78 93/67 101/72 (!) 81/57  Pulse: 95 94 89 79  Resp:  18 17 17    Temp: 98.7 F (37.1 C) 98.2 F (36.8 C) 98.5 F (36.9 C) 98.3 F (36.8 C)  TempSrc: Oral Oral Oral Oral  SpO2: 100% 100% 99% 99%  Weight:  39.1 kg (86 lb 1.6 oz)    Height:        Intake/Output Summary (Last 24 hours) at 08/14/16 1123 Last data filed at 08/14/16 0911  Gross per 24 hour  Intake           2527.5 ml  Output              200 ml  Net           2327.5 ml   Filed Weights   08/11/16 2321 08/12/16 2200 08/13/16 2106  Weight: 37.3 kg (82 lb 4.8 oz) 37.3 kg (82 lb 3.7 oz) 39.1 kg (86 lb 1.6 oz)    Examination: General exam: Appears calm and comfortable  Respiratory system: Clear to auscultation. Respiratory effort normal. Cardiovascular system: S1 & S2 heard, RRR. No JVD, murmurs, rubs, gallops or clicks. No pedal edema. Gastrointestinal system: Abdomen is nondistended, soft and nontender. No organomegaly or masses felt. Normal bowel sounds heard. Central nervous system: Alert and oriented. No focal neurological deficits. Extremities: Symmetric 5 x 5 power. Skin: No rashes, lesions or ulcers Psychiatry: Judgement and insight appear normal. Mood & affect appropriate.   Data Reviewed: I have personally reviewed following labs and imaging studies  CBC:  Recent Labs Lab 08/11/16 1804 08/11/16 2350 08/12/16 0553 08/13/16 0440  WBC 6.6  --  7.4 6.5  NEUTROABS 4.2  --   --   --   HGB 11.8*  --  10.2* 8.9*  HCT 35.2* 33.8* 30.3* 26.9*  MCV 93.6  --  92.1 91.2  PLT 218  --  205 478   Basic Metabolic Panel:  Recent Labs Lab 08/11/16 1804 08/12/16 0553 08/13/16 0440 08/14/16 0558 08/14/16 0953  NA 145 144 146* 145 144  K 3.7 3.8 2.6* 7.0* 6.4*  CL 105 112* 117* 123* 125*  CO2 14* 17* 22 17* 11*  GLUCOSE 76 59* 77 169* 176*  BUN 7 6 <5* <5* <5*  CREATININE 1.17* 0.95 0.61 0.59 0.62  CALCIUM 8.1* 7.3* 7.0* 7.5* 8.1*  MG  --   --   --  2.5*  --    GFR: Estimated Creatinine Clearance: 47.9 mL/min (by C-G formula based on SCr of 0.62  mg/dL). Liver Function Tests:  Recent Labs Lab 08/11/16 1804 08/12/16 0553 08/13/16 0440  AST 29 38 34  ALT 13* 13* 13*  ALKPHOS 80 64 48  BILITOT 1.8* 1.4* 0.8  PROT 5.1* 4.4* 3.6*  ALBUMIN 2.1* 1.7* 1.5*   No results for input(s): LIPASE, AMYLASE in the last 168 hours.  Recent Labs Lab 08/11/16 1952  AMMONIA 51*   Coagulation Profile: No results for input(s): INR, PROTIME in the last 168 hours. Cardiac Enzymes:  Recent Labs Lab 08/11/16 2350 08/12/16 0553 08/12/16 0858  TROPONINI 0.08* 0.07* 0.06*   BNP (last 3 results) No results for input(s): PROBNP in the last 8760 hours. HbA1C: No results for input(s): HGBA1C in the last 72 hours. CBG:  Recent Labs Lab 08/11/16 1721  GLUCAP 75   Lipid Profile: No results for  input(s): CHOL, HDL, LDLCALC, TRIG, CHOLHDL, LDLDIRECT in the last 72 hours. Thyroid Function Tests:  Recent Labs  08/11/16 2350  TSH 2.049   Anemia Panel:  Recent Labs  08/11/16 2350  VITAMINB12 699  FERRITIN 96  TIBC 119*  IRON 70   Urine analysis:    Component Value Date/Time   COLORURINE AMBER (A) 08/11/2016 1941   APPEARANCEUR TURBID (A) 08/11/2016 1941   LABSPEC 1.020 08/11/2016 1941   PHURINE 6.0 08/11/2016 1941   GLUCOSEU NEGATIVE 08/11/2016 1941   HGBUR SMALL (A) 08/11/2016 1941   BILIRUBINUR LARGE (A) 08/11/2016 1941   KETONESUR >80 (A) 08/11/2016 1941   PROTEINUR 30 (A) 08/11/2016 1941   UROBILINOGEN 1.0 02/28/2015 1227   NITRITE NEGATIVE 08/11/2016 1941   LEUKOCYTESUR LARGE (A) 08/11/2016 1941   Sepsis Labs: @LABRCNTIP (procalcitonin:4,lacticidven:4)  ) Recent Results (from the past 240 hour(s))  Blood Culture (routine x 2)     Status: None (Preliminary result)   Collection Time: 08/11/16  6:01 PM  Result Value Ref Range Status   Specimen Description BLOOD RIGHT ANTECUBITAL  Final   Special Requests IN PEDIATRIC BOTTLE 3CC  Final   Culture NO GROWTH 2 DAYS  Final   Report Status PENDING  Incomplete  Blood  Culture (routine x 2)     Status: None (Preliminary result)   Collection Time: 08/11/16  6:05 PM  Result Value Ref Range Status   Specimen Description BLOOD RIGHT HAND  Final   Special Requests IN PEDIATRIC BOTTLE 2CC  Final   Culture NO GROWTH 2 DAYS  Final   Report Status PENDING  Incomplete  Urine culture     Status: Abnormal   Collection Time: 08/11/16  7:41 PM  Result Value Ref Range Status   Specimen Description URINE, RANDOM  Final   Special Requests NONE  Final   Culture >=100,000 COLONIES/mL ENTEROCOCCUS FAECALIS (A)  Final   Report Status 08/14/2016 FINAL  Final   Organism ID, Bacteria ENTEROCOCCUS FAECALIS (A)  Final      Susceptibility   Enterococcus faecalis - MIC*    AMPICILLIN <=2 SENSITIVE Sensitive     LEVOFLOXACIN 1 SENSITIVE Sensitive     NITROFURANTOIN <=16 SENSITIVE Sensitive     VANCOMYCIN 1 SENSITIVE Sensitive     * >=100,000 COLONIES/mL ENTEROCOCCUS FAECALIS  C difficile quick scan w PCR reflex     Status: None   Collection Time: 08/11/16 11:24 PM  Result Value Ref Range Status   C Diff antigen NEGATIVE NEGATIVE Final   C Diff toxin NEGATIVE NEGATIVE Final   C Diff interpretation No C. difficile detected.  Final     Invalid input(s): PROCALCITONIN, LACTICACIDVEN   Radiology Studies: Ct Head Wo Contrast  Result Date: 08/12/2016 CLINICAL DATA:  Altered mental status EXAM: CT HEAD WITHOUT CONTRAST TECHNIQUE: Contiguous axial images were obtained from the base of the skull through the vertex without intravenous contrast. COMPARISON:  CT head 06/26/2016 FINDINGS: Brain: Moderate atrophy. Mild chronic ischemic change in the white matter. Negative for acute infarct, hemorrhage, or mass lesion. No change from the prior CT Vascular: Extensive carotid calcification and vertebral calcification bilaterally. Negative for dense MCA Skull: Negative Sinuses/Orbits: Negative Other: None IMPRESSION: Generalized atrophy.  No acute abnormality. Electronically Signed   By:  Franchot Gallo M.D.   On: 08/12/2016 20:40        Scheduled Meds: . apixaban  5 mg Oral BID  . aspirin EC  81 mg Oral Daily  . feeding supplement  1 Container  Oral TID BM  . feeding supplement (PRO-STAT SUGAR FREE 64)  30 mL Oral BID  . folic acid  1 mg Oral Daily  . gabapentin  100 mg Oral BID  . levofloxacin  500 mg Oral Q48H  . levothyroxine  25 mcg Oral QAC breakfast  . metoprolol tartrate  25 mg Oral BID  . pantoprazole  40 mg Oral Daily  . saccharomyces boulardii  250 mg Oral BID  . thiamine  100 mg Oral Daily  . venlafaxine  25 mg Oral BID   Continuous Infusions: . sodium chloride 100 mL/hr at 08/14/16 0755     LOS: 3 days    Time spent: 35 minutes    Duwane Gewirtz A, MD [facility] Pager [phone]  If 7PM-7AM, please contact night-coverage [email or web address] Password [email or web address] 08/14/2016, 11:23 AM

## 2016-08-14 NOTE — Progress Notes (Signed)
Pharmacy Antibiotic + Anticoagulant Note  [name] is a 55 y.o. female admitted on 08/11/2016 with UTI.  Pharmacy has been consulted for levofloxacin. Renal function stable. See cultures below.  Also on Eliquis for history of extensive DVT. Age: [age], SCr: 0.62, Weight <60kg- only meets 1 dose reduction requirement, therefore not eligible for lower dose. Hgb has dropped to 8.9, platelets normal, no bleeding noted.   Plan: -Continue Levaquin 500mg  IV q48h -Follow LOT and renal function -Continue Eliquis 5mg  BID- pharmacy to sign off and follow peripherally  Height: 5\' 6"  (167.6 cm) Weight: 86 lb 1.6 oz (39.1 kg) IBW/kg (Calculated) : 59.3  Temp (24hrs), Avg:98.4 F (36.9 C), Min:98.2 F (36.8 C), Max:98.7 F (37.1 C)   Recent Labs Lab 08/11/16 1804 08/11/16 1806 08/11/16 2053 08/12/16 0553 08/13/16 0440 08/14/16 0558 08/14/16 0953  WBC 6.6  --   --  7.4 6.5  --   --   CREATININE 1.17*  --   --  0.95 0.61 0.59 0.62  LATICACIDVEN  --  3.12* 2.97*  --   --   --   --     Estimated Creatinine Clearance: 47.9 mL/min (by C-G formula based on SCr of 0.62 mg/dL).    Allergies  Allergen Reactions  . Iohexol Other (See Comments)    "severe burning" Patient has received Contrast in 2005 with 13 hour pre-medication, and had no reaction at that time  . Spiriva Handihaler [Tiotropium Bromide Monohydrate] Nausea And Vomiting  . Iodinated Diagnostic Agents Other (See Comments)    Feels like she is "on fire" for months afterwards, if exposed  . Ativan [Lorazepam] Other (See Comments)    hallucinations  . Penicillins Hives    Has had cephalosporins Has patient had a PCN reaction causing immediate rash, facial/tongue/throat swelling, SOB or lightheadedness with hypotension: Yes Has patient had a PCN reaction causing severe rash involving mucus membranes or skin necrosis: Yes Has patient had a PCN reaction that required hospitalization: Yes Has patient had a PCN reaction occurring  within the last 10 years: No If all of the above answers are "NO", then may proceed with Cephalosporin use.   Antimicrobials this admission:  Levofloxacin 11/11>> Vancomycin 11/11>>11/11 Aztreonam 11/11>>11/11  Dose adjustments this admission:  N/a  Microbiology results:  11/11 urine: >100K enterococcus faecalis, pan sens 11/11 blood x 2: ngtd 1/11 CDiff: antigen and toxin neg  Essie Lagunes D. Tomothy Eddins, PharmD, BCPS Clinical Pharmacist Pager: [phone] 08/14/2016 10:52 AM

## 2016-08-15 DIAGNOSIS — N390 Urinary tract infection, site not specified: Secondary | ICD-10-CM

## 2016-08-15 LAB — CBC
HCT: 30.2 % — ABNORMAL LOW (ref 36.0–46.0)
Hemoglobin: 9.6 g/dL — ABNORMAL LOW (ref 12.0–15.0)
MCH: 30 pg (ref 26.0–34.0)
MCHC: 31.8 g/dL (ref 30.0–36.0)
MCV: 94.4 fL (ref 78.0–100.0)
PLATELETS: 175 10*3/uL (ref 150–400)
RBC: 3.2 MIL/uL — ABNORMAL LOW (ref 3.87–5.11)
RDW: 18.3 % — AB (ref 11.5–15.5)
WBC: 7.1 10*3/uL (ref 4.0–10.5)

## 2016-08-15 LAB — BASIC METABOLIC PANEL
Anion gap: 6 (ref 5–15)
BUN: <5 mg/dL — ABNORMAL LOW (ref 6–20)
CALCIUM: 7.6 mg/dL — AB (ref 8.9–10.3)
CHLORIDE: 106 mmol/L (ref 101–111)
CO2: 29 mmol/L (ref 22–32)
CREATININE: 0.58 mg/dL (ref 0.44–1.00)
GLUCOSE: 118 mg/dL — AB (ref 65–99)
POTASSIUM: 4.3 mmol/L (ref 3.5–5.1)
SODIUM: 141 mmol/L (ref 135–145)

## 2016-08-15 LAB — MAGNESIUM: Magnesium: 1.6 mg/dL — ABNORMAL LOW (ref 1.7–2.4)

## 2016-08-15 MED ORDER — MAGNESIUM OXIDE 400 MG PO TABS: 400.0000 mg | ORAL_TABLET | Freq: Two times a day (BID) | ORAL | 0 refills | Status: DC

## 2016-08-15 MED ORDER — MAGNESIUM SULFATE 2 GM/50ML IV SOLN
2.0000 g | Freq: Once | INTRAVENOUS | Status: DC
  Filled 2016-08-15: qty 50

## 2016-08-15 MED ORDER — MAGNESIUM OXIDE 400 (241.3 MG) MG PO TABS
400.0000 mg | ORAL_TABLET | Freq: Two times a day (BID) | ORAL | Status: DC
  Administered 2016-08-15: 400 mg via ORAL
  Filled 2016-08-15 (×2): qty 1

## 2016-08-15 NOTE — Care Management Important Message (Signed)
Important Message  Patient Details  Name: [name] MRN: [id number] Date of Birth: 12/30/1960   Medicare Important Message Given:  Yes    Lanaysia Fritchman Abena 08/15/2016, 10:26 AM

## 2016-08-15 NOTE — Discharge Summary (Signed)
[facility]  Physician Discharge Summary   Patient ID: [name] MRN: [id number] DOB/AGE: 1961/09/15 55 y.o.  Admit date: 08/11/2016 Discharge date: 08/15/2016  PCP: [name] Low, MD  DISCHARGE DIAGNOSES:  Active Problems:   Metabolic acidosis   Severe protein-calorie malnutrition (HCC)   Sepsis ([place])   Altered mental status   Protein calorie malnutrition ([place])   Diarrhea   Sacral decubitus ulcer, stage II   RECOMMENDATIONS FOR OUTPATIENT FOLLOW UP: 1. Outpatient follow-up with primary care physician 2. Please repeat basic metabolic panel at follow-up to Check electrolytes and bicarbonate   DISCHARGE CONDITION: fair  Diet recommendation: As before  [facility]ights   08/11/16 2321 08/12/16 2200 08/13/16 2106  Weight: 37.3 kg (82 lb 4.8 oz) 37.3 kg (82 lb 3.7 oz) 39.1 kg (86 lb 1.6 oz)    INITIAL HISTORY: [name] a 55 y.o.female,w PE/ DVT, Crohns disease, Protein calorie malnutrition, apparently presented with altered mental status. In the ED, Pt noted to be tachycardic and to have uti as well as altered mental status.   Consultations:  None  Procedures: Transthoracic echocardiogram Study Conclusions  - Left ventricle: The cavity size was normal. Wall thickness was   normal. Systolic function was normal. The estimated ejection   fraction was in the range of 55% to 60%. Wall motion was normal;   there were no regional wall motion abnormalities. Doppler   parameters are consistent with abnormal left ventricular   relaxation (grade 1 diastolic dysfunction).  Impressions:  - Normal LV systolic function; grade 1 diastolic dysfunction; trace   MR and TR.  HOSPITAL COURSE:   Sepsis secondary to UTI   Presented with a temperature of 100.7, heart rate of 158 and presence of UTI  consistent with sepsis. Had evidence of hypoperfusion with lactic acid of 2.97. Given vancomycin and aztreonam 1 in the ED, antibiotic switched to levofloxacin,  she is allergic to penicillin. She has completed 5 days of treatment. No need for continuation of antibiotics at this time.  Enterococcus faecalis UTI Urinalysis is consistent with UTI with TNTC WBC. Completed course of antibiotics with Levaquin.  Hyperkalemia After aggressive repletion of potassium, she became hyperkalemic. She was given medications to correct this. Now potassium is normal.   Hypokalemia Potassium is normal today. She is noted to have [name] magnesium which will be repleted orally.  Acute encephalopathy Per admission note she was awake and alert to person and place, but disoriented. CT scan is negative, has recurrent history of decreased level of consciousness and disorientation. B12, ESR and TSH WNL, RPR and ANA negative. This is likely secondary to UTI. This is resolved.  PE/ DVT Has history of extensive DVT in April 2017, on Eliquis, continue.  Anion gap metabolic acidosis This was thought to be secondary to sepsis. Anion gap closed with aggressive hydration. She continued to have [name] bicarbonate. She required bicarbonate infusion. Thought to be secondary to diarrhea. Now improved.  Protein calorie malnutrition (severe) Albumin of 1.5 and total protein of 4.4.  Acute kidney injury Creatinine was 1.17 on admission, this is improved with IV fluid hydration, resolved, creatinine 0.6.  Diarrhea Negative for C. difficile, patient reported having diarrhea every now and then.  Anemia Some drop in hemoglobin was due to effusion. Ferritin 96. Vitamin B-12 level 6-99. Folic acid 983. No evidence for overt bleeding.  Sinus Tachycardia Troponin was mildly positive at 0.07, this is likely secondary to demand ischemia from sepsis and tachycardia. Echocardiogram was ordered which revealed normal systolic function without  any wall motion abnormalities. No need for any further workup at this time. She was initially started on beta blocker with which her blood pressure  remained [name]. No clear indication to continue the same.  Stage II sacral decubitus ulcer Present on admission.  Patient remained stable. She is tolerating a diet. She has been known to be manipulative in the past when it comes to getting discharged from the hospital andaovoids discharge. She's been told that she is medically stable and should be able to go home and will need close follow-up with her PCP.    PERTINENT LABS:  The results of significant diagnostics from this hospitalization (including imaging, microbiology, ancillary and laboratory) are listed below for reference.    Microbiology: Recent Results (from the past 240 hour(s))  Blood Culture (routine x 2)     Status: None (Preliminary result)   Collection Time: 08/11/16  6:01 PM  Result Value Ref Range Status   Specimen Description BLOOD RIGHT ANTECUBITAL  Final   Special Requests IN PEDIATRIC BOTTLE 3CC  Final   Culture NO GROWTH 3 DAYS  Final   Report Status PENDING  Incomplete  Blood Culture (routine x 2)     Status: None (Preliminary result)   Collection Time: 08/11/16  6:05 PM  Result Value Ref Range Status   Specimen Description BLOOD RIGHT HAND  Final   Special Requests IN PEDIATRIC BOTTLE 2CC  Final   Culture NO GROWTH 3 DAYS  Final   Report Status PENDING  Incomplete  Urine culture     Status: Abnormal   Collection Time: 08/11/16  7:41 PM  Result Value Ref Range Status   Specimen Description URINE, RANDOM  Final   Special Requests NONE  Final   Culture >=100,000 COLONIES/mL ENTEROCOCCUS FAECALIS (A)  Final   Report Status 08/14/2016 FINAL  Final   Organism ID, Bacteria ENTEROCOCCUS FAECALIS (A)  Final      Susceptibility   Enterococcus faecalis - MIC*    AMPICILLIN <=2 SENSITIVE Sensitive     LEVOFLOXACIN 1 SENSITIVE Sensitive     NITROFURANTOIN <=16 SENSITIVE Sensitive     VANCOMYCIN 1 SENSITIVE Sensitive     * >=100,000 COLONIES/mL ENTEROCOCCUS FAECALIS  Stool culture (children & immunocomp patients)      Status: None (Preliminary result)   Collection Time: 08/11/16 11:24 PM  Result Value Ref Range Status   Salmonella/Shigella Screen Final report  Final    Comment: (NOTE) Performed At: [facility] [place], [place] [id number] Lindon Romp MD OI:[id number]    Campylobacter Culture PENDING  Incomplete   E coli, Shiga toxin Assay Negative Negative Final    Comment: (NOTE) Performed At: [facility] [place], [place] [id number] Lindon Romp MD [id number]   C difficile quick scan w PCR reflex     Status: None   Collection Time: 08/11/16 11:24 PM  Result Value Ref Range Status   C Diff antigen NEGATIVE NEGATIVE Final   C Diff toxin NEGATIVE NEGATIVE Final   C Diff interpretation No C. difficile detected.  Final  STOOL CULTURE REFLEX - RSASHR     Status: None   Collection Time: 08/11/16 11:24 PM  Result Value Ref Range Status   Stool Culture result 1 (RSASHR) Comment  Final    Comment: (NOTE) No Salmonella or Shigella recovered. Performed At: [facility] [address] [place], [place] [id number] Lindon Romp MD JM:[id number]      Labs: Basic Metabolic Panel:  Recent Labs Lab 08/13/16 0440 08/14/16 0558 08/14/16 0953 08/14/16 1703 08/15/16 0553  NA 146* 145 144 142 141  K 2.6* 7.0* 6.4* 4.4 4.3  CL 117* 123* 125* 116* 106  CO2 22 17* 11* 18* 29  GLUCOSE 77 169* 176* 173* 118*  BUN <5* <5* <5* <5* <5*  CREATININE 0.61 0.59 0.62 0.59 0.58  CALCIUM 7.0* 7.5* 8.1* 7.7* 7.6*  MG  --  2.5*  --   --  1.6*   Liver Function Tests:  Recent Labs Lab 08/11/16 1804 08/12/16 0553 08/13/16 0440  AST 29 38 34  ALT 13* 13* 13*  ALKPHOS 80 64 48  BILITOT 1.8* 1.4* 0.8  PROT 5.1* 4.4* 3.6*  ALBUMIN 2.1* 1.7* 1.5*    Recent Labs Lab 08/11/16 1952  AMMONIA 51*   CBC:  Recent Labs Lab 08/11/16 1804 08/11/16 2350 08/12/16 0553 08/13/16 0440 08/15/16 1019  WBC 6.6  --  7.4 6.5 7.1  NEUTROABS  4.2  --   --   --   --   HGB 11.8*  --  10.2* 8.9* 9.6*  HCT 35.2* 33.8* 30.3* 26.9* 30.2*  MCV 93.6  --  92.1 91.2 94.4  PLT 218  --  205 185 175   Cardiac Enzymes:  Recent Labs Lab 08/11/16 2350 08/12/16 0553 08/12/16 0858  TROPONINI 0.08* 0.07* 0.06*    CBG:  Recent Labs Lab 08/11/16 1721  GLUCAP 75     IMAGING STUDIES Ct Head Wo Contrast  Result Date: 08/12/2016 CLINICAL DATA:  Altered mental status EXAM: CT HEAD WITHOUT CONTRAST TECHNIQUE: Contiguous axial images were obtained from the base of the skull through the vertex without intravenous contrast. COMPARISON:  CT head 06/26/2016 FINDINGS: Brain: Moderate atrophy. Mild chronic ischemic change in the white matter. Negative for acute infarct, hemorrhage, or mass lesion. No change from the prior CT Vascular: Extensive carotid calcification and vertebral calcification bilaterally. Negative for dense MCA Skull: Negative Sinuses/Orbits: Negative Other: None IMPRESSION: Generalized atrophy.  No acute abnormality. Electronically Signed   By: Franchot Gallo M.D.   On: 08/12/2016 20:40   Dg Chest Port 1 View  Result Date: 08/11/2016 CLINICAL DATA:  Altered mental status, asthma, diabetes and hypertension. EXAM: PORTABLE CHEST 1 VIEW COMPARISON:  06/25/2016 FINDINGS: The lungs are clear. Minimal atelectasis at the left costophrenic angle. Cardiomediastinal contours are stable and within normal limits. Old right posterior sixth rib fracture. No acute osseous abnormality. No effusion. IMPRESSION: No active disease.  Old right posterior sixth rib fracture. Electronically Signed   By: Ashley Royalty M.D.   On: 08/11/2016 19:02    DISCHARGE EXAMINATION: Vitals:   08/14/16 1759 08/14/16 2117 08/15/16 0540 08/15/16 0931  BP: (!) 94/55 100/69 94/64 95/63   Pulse: 80 98 66 76  Resp: 18 16 16 14   Temp: 98 F (36.7 C) 99.1 F (37.3 C) 98.4 F (36.9 C) 98.5 F (36.9 C)  TempSrc: Oral Oral Oral Oral  SpO2: 99% 98% 100% 100%  Weight:       Height:       General appearance: alert, cooperative, appears stated age and no distress Resp: clear to auscultation bilaterally Cardio: regular rate and rhythm, S1, S2 normal, no murmur, click, rub or gallop GI: soft, non-tender; bowel sounds normal; no masses,  no organomegaly Extremities: extremities normal, atraumatic, no cyanosis or edema  DISPOSITION: Home  Discharge Instructions    Call MD for:  difficulty breathing, headache or visual disturbances    Complete by:  As directed  Call MD for:  extreme fatigue    Complete by:  As directed    Call MD for:  persistant dizziness or light-headedness    Complete by:  As directed    Call MD for:  persistant nausea and vomiting    Complete by:  As directed    Call MD for:  temperature >100.4    Complete by:  As directed    Discharge instructions    Complete by:  As directed    Please be sure to follow-up with your primary care provider within a week for blood work to check Electrolytes including potassium level.  You were cared for by a hospitalist during your hospital stay. If you have any questions about your discharge medications or the care you received while you were in the hospital after you are discharged, you can call the unit and asked to speak with the hospitalist on call if the hospitalist that took care of you is not available. Once you are discharged, your primary care physician will handle any further medical issues. Please note that NO REFILLS for any discharge medications will be authorized once you are discharged, as it is imperative that you return to your primary care physician (or establish a relationship with a primary care physician if you do not have one) for your aftercare needs so that they can reassess your need for medications and monitor your lab values. If you do not have a primary care physician, you can call [phone] for a physician referral.   Increase activity slowly    Complete by:  As directed        ALLERGIES:  Allergies  Allergen Reactions  . Iohexol Other (See Comments)    "severe burning" Patient has received Contrast in 2005 with 13 hour pre-medication, and had no reaction at that time  . Spiriva Handihaler [Tiotropium Bromide Monohydrate] Nausea And Vomiting  . Iodinated Diagnostic Agents Other (See Comments)    Feels like she is "on fire" for months afterwards, if exposed  . Ativan [Lorazepam] Other (See Comments)    hallucinations  . Penicillins Hives    Has had cephalosporins Has patient had a PCN reaction causing immediate rash, facial/tongue/throat swelling, SOB or lightheadedness with hypotension: Yes Has patient had a PCN reaction causing severe rash involving mucus membranes or skin necrosis: Yes Has patient had a PCN reaction that required hospitalization: Yes Has patient had a PCN reaction occurring within the last 10 years: No If all of the above answers are "NO", then may proceed with Cephalosporin use.     Current Discharge Medication List    START taking these medications   Details  magnesium oxide (MAG-OX) 400 MG tablet Take 1 tablet (400 mg total) by mouth 2 (two) times daily. Qty: 30 tablet, Refills: 0      CONTINUE these medications which have NOT CHANGED   Details  calcium carbonate (OS-CAL - DOSED IN MG OF ELEMENTAL CALCIUM) 1250 (500 CA) MG tablet Take 1 tablet (500 mg of elemental calcium total) by mouth daily with breakfast. Qty: 30 tablet, Refills: 0    diphenoxylate-atropine (LOMOTIL) 2.5-0.025 MG tablet Take 1 tablet by mouth 4 (four) times daily as needed for diarrhea or loose stools. Qty: 30 tablet, Refills: 0    feeding supplement, RESOURCE BREEZE, (RESOURCE BREEZE) LIQD Take 1 Container by mouth 3 (three) times daily between meals. Refills: 0    HYDROmorphone (DILAUDID) 4 MG tablet Take 8 mg by mouth every 4 (four) hours as needed  for severe pain.    levothyroxine (SYNTHROID, LEVOTHROID) 25 MCG tablet Take 1 tablet (25 mcg total)  by mouth daily before breakfast. Qty: 30 tablet, Refills: 0    potassium chloride (MICRO-K) 10 MEQ CR capsule Take 20 mEq by mouth 2 (two) times daily.    thiamine (VITAMIN B-1) 100 MG tablet Take 1 tablet (100 mg total) by mouth daily. Qty: 30 tablet, Refills: 0    traZODone (DESYREL) 25 mg TABS tablet Take 0.5 tablets (25 mg total) by mouth at bedtime as needed for sleep. Qty: 30 tablet, Refills: 0    venlafaxine (EFFEXOR) 25 MG tablet Take 1 tablet (25 mg total) by mouth 2 (two) times daily. Qty: 30 tablet, Refills: 0    apixaban (ELIQUIS) 5 MG TABS tablet Take 1 tablet (5 mg total) by mouth 2 (two) times daily. Qty: 60 tablet, Refills: 0      STOP taking these medications     folic acid (FOLVITE) 1 MG tablet      gabapentin (NEURONTIN) 100 MG capsule      ondansetron (ZOFRAN ODT) 4 MG disintegrating tablet      pantoprazole (PROTONIX) 20 MG tablet      saccharomyces boulardii (FLORASTOR) 250 MG capsule          Follow-up Information    HUSAIN,KARRAR, MD. Schedule an appointment as soon as possible for a visit in 1 week(s).   Specialty:  Internal Medicine Contact information: [address] [place] [address] [phone]           TOTAL DISCHARGE TIME: 35 minutes  [facility]  [facility] Pager [phone]  08/15/2016, 2:53 PM

## 2016-08-16 DIAGNOSIS — I82412 Acute embolism and thrombosis of left femoral vein: Secondary | ICD-10-CM | POA: Diagnosis not present

## 2016-08-16 DIAGNOSIS — I82432 Acute embolism and thrombosis of left popliteal vein: Secondary | ICD-10-CM | POA: Diagnosis not present

## 2016-08-16 DIAGNOSIS — E1140 Type 2 diabetes mellitus with diabetic neuropathy, unspecified: Secondary | ICD-10-CM | POA: Diagnosis not present

## 2016-08-16 DIAGNOSIS — L89312 Pressure ulcer of right buttock, stage 2: Secondary | ICD-10-CM | POA: Diagnosis not present

## 2016-08-16 DIAGNOSIS — I82492 Acute embolism and thrombosis of other specified deep vein of left lower extremity: Secondary | ICD-10-CM | POA: Diagnosis not present

## 2016-08-16 DIAGNOSIS — I82442 Acute embolism and thrombosis of left tibial vein: Secondary | ICD-10-CM | POA: Diagnosis not present

## 2016-08-16 LAB — CULTURE, BLOOD (ROUTINE X 2)
CULTURE: NO GROWTH
Culture: NO GROWTH

## 2016-08-17 LAB — STOOL CULTURE: E coli, Shiga toxin Assay: NEGATIVE

## 2016-08-17 LAB — STOOL CULTURE REFLEX - CMPCXR

## 2016-08-17 LAB — STOOL CULTURE REFLEX - RSASHR

## 2016-08-17 LAB — FECAL LACTOFERRIN, QUANT: Lactoferrin, Fecal, Quant.: 16.4 ug/mL(g) — ABNORMAL HIGH (ref 0.00–7.24)

## 2016-08-21 DIAGNOSIS — I82492 Acute embolism and thrombosis of other specified deep vein of left lower extremity: Secondary | ICD-10-CM | POA: Diagnosis not present

## 2016-08-21 DIAGNOSIS — E1140 Type 2 diabetes mellitus with diabetic neuropathy, unspecified: Secondary | ICD-10-CM | POA: Diagnosis not present

## 2016-08-21 DIAGNOSIS — I82412 Acute embolism and thrombosis of left femoral vein: Secondary | ICD-10-CM | POA: Diagnosis not present

## 2016-08-21 DIAGNOSIS — I82442 Acute embolism and thrombosis of left tibial vein: Secondary | ICD-10-CM | POA: Diagnosis not present

## 2016-08-21 DIAGNOSIS — I82432 Acute embolism and thrombosis of left popliteal vein: Secondary | ICD-10-CM | POA: Diagnosis not present

## 2016-08-21 DIAGNOSIS — L89312 Pressure ulcer of right buttock, stage 2: Secondary | ICD-10-CM | POA: Diagnosis not present

## 2016-08-28 DIAGNOSIS — I82432 Acute embolism and thrombosis of left popliteal vein: Secondary | ICD-10-CM | POA: Diagnosis not present

## 2016-08-28 DIAGNOSIS — L89312 Pressure ulcer of right buttock, stage 2: Secondary | ICD-10-CM | POA: Diagnosis not present

## 2016-08-28 DIAGNOSIS — I82442 Acute embolism and thrombosis of left tibial vein: Secondary | ICD-10-CM | POA: Diagnosis not present

## 2016-08-28 DIAGNOSIS — I82412 Acute embolism and thrombosis of left femoral vein: Secondary | ICD-10-CM | POA: Diagnosis not present

## 2016-08-28 DIAGNOSIS — E1140 Type 2 diabetes mellitus with diabetic neuropathy, unspecified: Secondary | ICD-10-CM | POA: Diagnosis not present

## 2016-08-28 DIAGNOSIS — I82492 Acute embolism and thrombosis of other specified deep vein of left lower extremity: Secondary | ICD-10-CM | POA: Diagnosis not present

## 2016-08-29 ENCOUNTER — Ambulatory Visit: Payer: Self-pay

## 2016-08-30 DIAGNOSIS — L89312 Pressure ulcer of right buttock, stage 2: Secondary | ICD-10-CM | POA: Diagnosis not present

## 2016-08-30 DIAGNOSIS — E1140 Type 2 diabetes mellitus with diabetic neuropathy, unspecified: Secondary | ICD-10-CM | POA: Diagnosis not present

## 2016-08-30 DIAGNOSIS — I82442 Acute embolism and thrombosis of left tibial vein: Secondary | ICD-10-CM | POA: Diagnosis not present

## 2016-08-30 DIAGNOSIS — I82492 Acute embolism and thrombosis of other specified deep vein of left lower extremity: Secondary | ICD-10-CM | POA: Diagnosis not present

## 2016-08-30 DIAGNOSIS — I82412 Acute embolism and thrombosis of left femoral vein: Secondary | ICD-10-CM | POA: Diagnosis not present

## 2016-08-30 DIAGNOSIS — I82432 Acute embolism and thrombosis of left popliteal vein: Secondary | ICD-10-CM | POA: Diagnosis not present

## 2016-09-04 DIAGNOSIS — E1140 Type 2 diabetes mellitus with diabetic neuropathy, unspecified: Secondary | ICD-10-CM | POA: Diagnosis not present

## 2016-09-04 DIAGNOSIS — L89312 Pressure ulcer of right buttock, stage 2: Secondary | ICD-10-CM | POA: Diagnosis not present

## 2016-09-04 DIAGNOSIS — I82412 Acute embolism and thrombosis of left femoral vein: Secondary | ICD-10-CM | POA: Diagnosis not present

## 2016-09-04 DIAGNOSIS — I82432 Acute embolism and thrombosis of left popliteal vein: Secondary | ICD-10-CM | POA: Diagnosis not present

## 2016-09-04 DIAGNOSIS — I82442 Acute embolism and thrombosis of left tibial vein: Secondary | ICD-10-CM | POA: Diagnosis not present

## 2016-09-04 DIAGNOSIS — I82492 Acute embolism and thrombosis of other specified deep vein of left lower extremity: Secondary | ICD-10-CM | POA: Diagnosis not present

## 2016-09-06 DIAGNOSIS — I82412 Acute embolism and thrombosis of left femoral vein: Secondary | ICD-10-CM | POA: Diagnosis not present

## 2016-09-06 DIAGNOSIS — I82432 Acute embolism and thrombosis of left popliteal vein: Secondary | ICD-10-CM | POA: Diagnosis not present

## 2016-09-06 DIAGNOSIS — L89312 Pressure ulcer of right buttock, stage 2: Secondary | ICD-10-CM | POA: Diagnosis not present

## 2016-09-06 DIAGNOSIS — I82492 Acute embolism and thrombosis of other specified deep vein of left lower extremity: Secondary | ICD-10-CM | POA: Diagnosis not present

## 2016-09-06 DIAGNOSIS — I82442 Acute embolism and thrombosis of left tibial vein: Secondary | ICD-10-CM | POA: Diagnosis not present

## 2016-09-06 DIAGNOSIS — E1140 Type 2 diabetes mellitus with diabetic neuropathy, unspecified: Secondary | ICD-10-CM | POA: Diagnosis not present

## 2016-09-11 DIAGNOSIS — E1140 Type 2 diabetes mellitus with diabetic neuropathy, unspecified: Secondary | ICD-10-CM | POA: Diagnosis not present

## 2016-09-11 DIAGNOSIS — I82492 Acute embolism and thrombosis of other specified deep vein of left lower extremity: Secondary | ICD-10-CM | POA: Diagnosis not present

## 2016-09-11 DIAGNOSIS — L89312 Pressure ulcer of right buttock, stage 2: Secondary | ICD-10-CM | POA: Diagnosis not present

## 2016-09-11 DIAGNOSIS — I82412 Acute embolism and thrombosis of left femoral vein: Secondary | ICD-10-CM | POA: Diagnosis not present

## 2016-09-11 DIAGNOSIS — I82432 Acute embolism and thrombosis of left popliteal vein: Secondary | ICD-10-CM | POA: Diagnosis not present

## 2016-09-11 DIAGNOSIS — I82442 Acute embolism and thrombosis of left tibial vein: Secondary | ICD-10-CM | POA: Diagnosis not present

## 2016-09-13 DIAGNOSIS — E1140 Type 2 diabetes mellitus with diabetic neuropathy, unspecified: Secondary | ICD-10-CM | POA: Diagnosis not present

## 2016-09-13 DIAGNOSIS — I82492 Acute embolism and thrombosis of other specified deep vein of left lower extremity: Secondary | ICD-10-CM | POA: Diagnosis not present

## 2016-09-13 DIAGNOSIS — I82442 Acute embolism and thrombosis of left tibial vein: Secondary | ICD-10-CM | POA: Diagnosis not present

## 2016-09-13 DIAGNOSIS — I82432 Acute embolism and thrombosis of left popliteal vein: Secondary | ICD-10-CM | POA: Diagnosis not present

## 2016-09-13 DIAGNOSIS — L89312 Pressure ulcer of right buttock, stage 2: Secondary | ICD-10-CM | POA: Diagnosis not present

## 2016-09-13 DIAGNOSIS — I82412 Acute embolism and thrombosis of left femoral vein: Secondary | ICD-10-CM | POA: Diagnosis not present

## 2016-09-17 ENCOUNTER — Emergency Department (HOSPITAL_COMMUNITY)
Admission: EM | Admit: 2016-09-17 | Discharge: 2016-09-17 | Disposition: A | Discharge status: Home / Self Care | Payer: Medicare Other | Attending: Emergency Medicine | Admitting: Emergency Medicine

## 2016-09-17 ENCOUNTER — Encounter (HOSPITAL_COMMUNITY): Payer: Self-pay

## 2016-09-17 ENCOUNTER — Emergency Department (HOSPITAL_COMMUNITY): Payer: Medicare Other

## 2016-09-17 DIAGNOSIS — F17210 Nicotine dependence, cigarettes, uncomplicated: Secondary | ICD-10-CM | POA: Diagnosis not present

## 2016-09-17 DIAGNOSIS — I499 Cardiac arrhythmia, unspecified: Secondary | ICD-10-CM | POA: Diagnosis not present

## 2016-09-17 DIAGNOSIS — Z7901 Long term (current) use of anticoagulants: Secondary | ICD-10-CM | POA: Insufficient documentation

## 2016-09-17 DIAGNOSIS — E1140 Type 2 diabetes mellitus with diabetic neuropathy, unspecified: Secondary | ICD-10-CM | POA: Insufficient documentation

## 2016-09-17 DIAGNOSIS — J45909 Unspecified asthma, uncomplicated: Secondary | ICD-10-CM | POA: Diagnosis not present

## 2016-09-17 DIAGNOSIS — K641 Second degree hemorrhoids: Secondary | ICD-10-CM | POA: Diagnosis not present

## 2016-09-17 DIAGNOSIS — I4891 Unspecified atrial fibrillation: Secondary | ICD-10-CM | POA: Diagnosis not present

## 2016-09-17 DIAGNOSIS — E039 Hypothyroidism, unspecified: Secondary | ICD-10-CM | POA: Diagnosis not present

## 2016-09-17 DIAGNOSIS — R000 Tachycardia, unspecified: Secondary | ICD-10-CM

## 2016-09-17 DIAGNOSIS — I10 Essential (primary) hypertension: Secondary | ICD-10-CM | POA: Diagnosis not present

## 2016-09-17 LAB — BASIC METABOLIC PANEL
ANION GAP: 12 (ref 5–15)
CALCIUM: 8 mg/dL — AB (ref 8.9–10.3)
CO2: 21 mmol/L — ABNORMAL LOW (ref 22–32)
Chloride: 108 mmol/L (ref 101–111)
Creatinine, Ser: 0.71 mg/dL (ref 0.44–1.00)
GFR calc Af Amer: >60 mL/min (ref >60)
Glucose, Bld: 87 mg/dL (ref 65–99)
Potassium: 4.3 mmol/L (ref 3.5–5.1)
SODIUM: 141 mmol/L (ref 135–145)

## 2016-09-17 LAB — PROTIME-INR
INR: 1.57
Prothrombin Time: 18.9 seconds — ABNORMAL HIGH (ref 11.4–15.2)

## 2016-09-17 LAB — CBC
HCT: 34.1 % — ABNORMAL LOW (ref 36.0–46.0)
Hemoglobin: 11.7 g/dL — ABNORMAL LOW (ref 12.0–15.0)
MCH: 30.3 pg (ref 26.0–34.0)
MCHC: 34.3 g/dL (ref 30.0–36.0)
MCV: 88.3 fL (ref 78.0–100.0)
PLATELETS: 341 10*3/uL (ref 150–400)
RBC: 3.86 MIL/uL — AB (ref 3.87–5.11)
RDW: 16.5 % — AB (ref 11.5–15.5)
WBC: 9.8 10*3/uL (ref 4.0–10.5)

## 2016-09-17 LAB — I-STAT TROPONIN, ED: TROPONIN I, POC: 0 ng/mL (ref 0.00–0.08)

## 2016-09-17 NOTE — ED Provider Notes (Signed)
[facility]-EMERGENCY DEPT Provider Note   CSN: [id number] Arrival date & time: 09/17/16  1126     History   Chief Complaint Chief Complaint  Patient presents with  . Atrial Fibrillation    HPI [name] is a 55 y.o. female.  HPI 55 y.o female noted to be in a fib today.  Seen at Dr. Laverle Hobby office for hemorrhoids.  She denies sob, chest pain or history of same.  Past Medical History:  Diagnosis Date  . Anxiety   . Arthritis    "a little bit; all over" (05/01/2016)  . Asthma   . Asthma   . C. difficile colitis   . Chronic lower back pain   . Crohn disease (HCC)   . Diabetes mellitus without complication (HCC)   . DVT (deep venous thrombosis) (HCC) ~ 03/2016   LLE  . Elevated lactic acid level   . GERD (gastroesophageal reflux disease)   . Glaucoma   . HLD (hyperlipidemia)   . Hypertension   . Hypothyroidism   . Neuropathy (HCC)   . Pancreatitis   . Pulmonary embolus (HCC) 05/24/2014  . Thrombocytopenia (HCC)   . Tobacco abuse     Patient Active Problem List   Diagnosis Date Noted  . Sacral decubitus ulcer, stage II 08/12/2016  . Altered mental status 08/11/2016  . Protein calorie malnutrition (HCC) 08/11/2016  . Diarrhea 08/11/2016  . Delirium 06/26/2016  . Pressure injury of skin 06/26/2016  . Abdominal pain, generalized   . Abdominal pain   . AKI (acute kidney injury) (HCC)   . Sepsis (HCC)   . UTI (urinary tract infection) 02/28/2016  . 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 Excoriated rash-medial thighs 02/28/2016  . DVT (deep venous thrombosis), unspecified laterality 01/29/2016  . Bilateral tibial fractures   . Hypothyroidism   . GERD (gastroesophageal reflux disease)   . HLD (hyperlipidemia)   . Enteritis due to Clostridium difficile 03/23/2015  . Thrombocytopenia (HCC) 03/21/2015  . Weakness of back 03/21/2015  . Chronic pain 02/28/2015  . Severe protein-calorie malnutrition (HCC) 12/30/2014  . Hepatic steatosis 12/29/2014  . Chronic diarrhea  of unknown origin 12/29/2014  . Elevated lactic acid level   . Dehydration 07/26/2014  . Arterial hypotension 07/26/2014  . History of pulmonary embolus (PE) 05/24/2014  . Physical deconditioning 05/14/2014  . Nicotine abuse 05/08/2014  . Anemia 10/15/2012  . Metabolic acidosis 10/14/2012  . Diabetes mellitus with complication (HCC) 05/12/2007  . Anxiety state 05/12/2007  . Depression 05/12/2007  . Peripheral neuropathy (HCC) 05/12/2007  . HTN (hypertension) 05/12/2007  . ALLERGIC RHINITIS 05/12/2007  . Asthma 05/12/2007  . PANCREATITIS, HX OF 05/12/2007    Past Surgical History:  Procedure Laterality Date  . CESAREAN SECTION  1996  . COLONOSCOPY N/A 03/02/2015   Procedure: COLONOSCOPY;  Surgeon: Bernette Redbird, MD;  Location: [facility] ENDOSCOPY;  Service: Endoscopy;  Laterality: N/A;  . ESOPHAGOGASTRODUODENOSCOPY N/A 05/21/2014   Procedure: ESOPHAGOGASTRODUODENOSCOPY (EGD);  Surgeon: Petra Kuba, MD;  Location: [facility] ENDOSCOPY;  Service: Endoscopy;  Laterality: N/A;  . ESOPHAGOGASTRODUODENOSCOPY N/A 03/02/2015   Procedure: ESOPHAGOGASTRODUODENOSCOPY (EGD);  Surgeon: Bernette Redbird, MD;  Location: [facility] ENDOSCOPY;  Service: Endoscopy;  Laterality: N/A;    OB History    No data available       Home Medications    Prior to Admission medications   Medication Sig Start Date End Date Taking? Authorizing Provider  apixaban (ELIQUIS) 5 MG TABS tablet Take 1 tablet (5 mg total) by mouth 2 (two) times  daily. 05/02/16   Richarda OverlieNayana Abrol, MD  calcium carbonate (OS-CAL - DOSED IN MG OF ELEMENTAL CALCIUM) 1250 (500 CA) MG tablet Take 1 tablet (500 mg of elemental calcium total) by mouth daily with breakfast. 03/04/15   Alison MurrayAlma M Devine, MD  diphenoxylate-atropine (LOMOTIL) 2.5-0.025 MG tablet Take 1 tablet by mouth 4 (four) times daily as needed for diarrhea or loose stools. 07/01/16   Dorothea OgleIskra M Myers, MD  feeding supplement, RESOURCE BREEZE, (RESOURCE BREEZE) LIQD Take 1 Container by mouth 3 (three) times daily  between meals. 08/02/14   Kathlen ModyVijaya Akula, MD  HYDROmorphone (DILAUDID) 4 MG tablet Take 8 mg by mouth every 4 (four) hours as needed for severe pain.    Historical Provider, MD  levothyroxine (SYNTHROID, LEVOTHROID) 25 MCG tablet Take 1 tablet (25 mcg total) by mouth daily before breakfast. 03/06/16   Richarda OverlieNayana Abrol, MD  magnesium oxide (MAG-OX) 400 MG tablet Take 1 tablet (400 mg total) by mouth 2 (two) times daily. 08/15/16   Osvaldo ShipperGokul Krishnan, MD  potassium chloride (MICRO-K) 10 MEQ CR capsule Take 20 mEq by mouth 2 (two) times daily.    Historical Provider, MD  thiamine (VITAMIN B-1) 100 MG tablet Take 1 tablet (100 mg total) by mouth daily. 03/06/16   Richarda OverlieNayana Abrol, MD  traZODone (DESYREL) 25 mg TABS tablet Take 0.5 tablets (25 mg total) by mouth at bedtime as needed for sleep. 08/02/14   Kathlen ModyVijaya Akula, MD  venlafaxine (EFFEXOR) 25 MG tablet Take 1 tablet (25 mg total) by mouth 2 (two) times daily. 07/01/16   Dorothea OgleIskra M Myers, MD    Family History Family History  Problem Relation Age of Onset  . Breast cancer Mother   . Heart disease Mother [id number]    CABG  . Diabetes Mother   . Heart disease Father 362    CABG  . Diabetes Father     Social History Social History  Substance Use Topics  . Smoking status: Current Every Day Smoker    Packs/day: 0.25    Years: 37.00    Types: Cigarettes  . Smokeless tobacco: Never Used  . Alcohol use No     Allergies   Iohexol; Spiriva handihaler [tiotropium bromide monohydrate]; Iodinated diagnostic agents; Ativan [lorazepam]; and Penicillins   Review of Systems Review of Systems  All other systems reviewed and are negative.    Physical Exam Updated Vital Signs BP 110/85 (BP Location: Right Arm)   Pulse 110   Temp 98.3 F (36.8 C) (Oral)   Resp 18   Ht 5\' 1"  (1.549 m)   Wt 40.8 kg   LMP 10/01/2010 Comment: Has depro  SpO2 100%   BMI 17.01 kg/m   Physical Exam  Constitutional: She is oriented to person, place, and time. She appears well-developed  and well-nourished.  HENT:  Head: Normocephalic and atraumatic.  Eyes: EOM are normal. Pupils are equal, round, and reactive to light.  Neck: Normal range of motion.  Cardiovascular: Tachycardia present.   Pulmonary/Chest: Effort normal and breath sounds normal.  Abdominal: Soft. Bowel sounds are normal.  Musculoskeletal: Normal range of motion.  Neurological: She is alert and oriented to person, place, and time. No cranial nerve deficit.  Skin: Skin is dry. Capillary refill takes less than 2 seconds.  Psychiatric: She has a normal mood and affect.  Nursing note and vitals reviewed.    ED Treatments / Results  Labs (all labs ordered are listed, but only abnormal results are displayed) Labs Reviewed - No data to display  EKG  EKG Interpretation  Date/Time:  Monday September 17 2016 11:26:42 EST Ventricular Rate:  110 PR Interval:    QRS Duration: 126 QT Interval:  301 QTC Calculation: 431 R Axis:   0 Text Interpretation:  Atrial fibrillation Paired ventricular premature complexes Nonspecific intraventricular conduction delay Anterior infarct, old Borderline repolarization abnormality ST elevation, consider inferior injury Poor data quality Confirmed by Eileene Kisling MD, Nirvaan Frett [phone]) on 09/17/2016 1:52:08 PM       EKG Interpretation  Date/Time:  Monday September 17 2016 14:02:17 EST Ventricular Rate:  101 PR Interval:    QRS Duration: 62 QT Interval:  387 QTC Calculation: 502 R Axis:   31 Text Interpretation:  Sinus tachycardia with irregular rate Anterior infarct, old Nonspecific T abnormalities, lateral leads Confirmed by Pistol Kessenich MD, Duwayne Heck ([id number]) on 09/17/2016 2:26:19 PM       Radiology No results found.  Procedures Procedures (including critical care time)  Medications Ordered in ED Medications - No data to display   Initial Impression / Assessment and Plan / ED Course  I have reviewed the triage vital signs and the nursing notes.  Pertinent labs & imaging  results that were available during my care of the patient were reviewed by me and considered in my medical decision making (see chart for details).  Clinical Course    This is a 55 year old female sent here from primary care office with reports that she is a new atrial fibrillation. Initial EKG shows tachycardia with poor quality data. Lots of wander at baseline is difficult to tell what rhythm she is in. The patient became calm and had a repeat EKG is performed which shows a sinus tachycardia.  Operatory evaluation otherwise normal except for mild anemia of 11.7. Patient remains hemodynamically stable here. She is discharged from primary care.  Final Clinical Impressions(s) / ED Diagnoses   Final diagnoses:  Tachycardia    New Prescriptions New Prescriptions   No medications on file     Margarita Grizzle, MD 09/17/16 1616

## 2016-09-17 NOTE — ED Triage Notes (Signed)
Patient presents to the er with ems after going to her doctor for hemrrhoids, they checked her vital signs and her heart rate was 150, on ems arrival she was in atrial fib, this is new for her per the patient, the patient denies any sob, dizziness or chest pains, complains of pain in her bottom and lower back. Patient is alert and oriented.

## 2016-09-18 DIAGNOSIS — I82442 Acute embolism and thrombosis of left tibial vein: Secondary | ICD-10-CM | POA: Diagnosis not present

## 2016-09-18 DIAGNOSIS — I82412 Acute embolism and thrombosis of left femoral vein: Secondary | ICD-10-CM | POA: Diagnosis not present

## 2016-09-18 DIAGNOSIS — I82492 Acute embolism and thrombosis of other specified deep vein of left lower extremity: Secondary | ICD-10-CM | POA: Diagnosis not present

## 2016-09-18 DIAGNOSIS — L89312 Pressure ulcer of right buttock, stage 2: Secondary | ICD-10-CM | POA: Diagnosis not present

## 2016-09-18 DIAGNOSIS — I82432 Acute embolism and thrombosis of left popliteal vein: Secondary | ICD-10-CM | POA: Diagnosis not present

## 2016-09-18 DIAGNOSIS — E1140 Type 2 diabetes mellitus with diabetic neuropathy, unspecified: Secondary | ICD-10-CM | POA: Diagnosis not present

## 2016-09-20 DIAGNOSIS — I82492 Acute embolism and thrombosis of other specified deep vein of left lower extremity: Secondary | ICD-10-CM | POA: Diagnosis not present

## 2016-09-20 DIAGNOSIS — E1140 Type 2 diabetes mellitus with diabetic neuropathy, unspecified: Secondary | ICD-10-CM | POA: Diagnosis not present

## 2016-09-20 DIAGNOSIS — I82412 Acute embolism and thrombosis of left femoral vein: Secondary | ICD-10-CM | POA: Diagnosis not present

## 2016-09-20 DIAGNOSIS — I82432 Acute embolism and thrombosis of left popliteal vein: Secondary | ICD-10-CM | POA: Diagnosis not present

## 2016-09-20 DIAGNOSIS — I82442 Acute embolism and thrombosis of left tibial vein: Secondary | ICD-10-CM | POA: Diagnosis not present

## 2016-09-20 DIAGNOSIS — L89312 Pressure ulcer of right buttock, stage 2: Secondary | ICD-10-CM | POA: Diagnosis not present

## 2016-09-25 DIAGNOSIS — I82492 Acute embolism and thrombosis of other specified deep vein of left lower extremity: Secondary | ICD-10-CM | POA: Diagnosis not present

## 2016-09-25 DIAGNOSIS — I82442 Acute embolism and thrombosis of left tibial vein: Secondary | ICD-10-CM | POA: Diagnosis not present

## 2016-09-25 DIAGNOSIS — I82432 Acute embolism and thrombosis of left popliteal vein: Secondary | ICD-10-CM | POA: Diagnosis not present

## 2016-09-25 DIAGNOSIS — I82412 Acute embolism and thrombosis of left femoral vein: Secondary | ICD-10-CM | POA: Diagnosis not present

## 2016-09-25 DIAGNOSIS — L89312 Pressure ulcer of right buttock, stage 2: Secondary | ICD-10-CM | POA: Diagnosis not present

## 2016-09-25 DIAGNOSIS — E1140 Type 2 diabetes mellitus with diabetic neuropathy, unspecified: Secondary | ICD-10-CM | POA: Diagnosis not present

## 2016-09-27 DIAGNOSIS — L89312 Pressure ulcer of right buttock, stage 2: Secondary | ICD-10-CM | POA: Diagnosis not present

## 2016-09-27 DIAGNOSIS — E1140 Type 2 diabetes mellitus with diabetic neuropathy, unspecified: Secondary | ICD-10-CM | POA: Diagnosis not present

## 2016-09-27 DIAGNOSIS — I82432 Acute embolism and thrombosis of left popliteal vein: Secondary | ICD-10-CM | POA: Diagnosis not present

## 2016-09-27 DIAGNOSIS — I82442 Acute embolism and thrombosis of left tibial vein: Secondary | ICD-10-CM | POA: Diagnosis not present

## 2016-09-27 DIAGNOSIS — I82412 Acute embolism and thrombosis of left femoral vein: Secondary | ICD-10-CM | POA: Diagnosis not present

## 2016-09-27 DIAGNOSIS — I82492 Acute embolism and thrombosis of other specified deep vein of left lower extremity: Secondary | ICD-10-CM | POA: Diagnosis not present

## 2016-10-02 DIAGNOSIS — L89312 Pressure ulcer of right buttock, stage 2: Secondary | ICD-10-CM | POA: Diagnosis not present

## 2016-10-02 DIAGNOSIS — I82492 Acute embolism and thrombosis of other specified deep vein of left lower extremity: Secondary | ICD-10-CM | POA: Diagnosis not present

## 2016-10-02 DIAGNOSIS — I82442 Acute embolism and thrombosis of left tibial vein: Secondary | ICD-10-CM | POA: Diagnosis not present

## 2016-10-02 DIAGNOSIS — E1140 Type 2 diabetes mellitus with diabetic neuropathy, unspecified: Secondary | ICD-10-CM | POA: Diagnosis not present

## 2016-10-02 DIAGNOSIS — I82412 Acute embolism and thrombosis of left femoral vein: Secondary | ICD-10-CM | POA: Diagnosis not present

## 2016-10-02 DIAGNOSIS — I82432 Acute embolism and thrombosis of left popliteal vein: Secondary | ICD-10-CM | POA: Diagnosis not present

## 2016-10-04 DIAGNOSIS — E1140 Type 2 diabetes mellitus with diabetic neuropathy, unspecified: Secondary | ICD-10-CM | POA: Diagnosis not present

## 2016-10-04 DIAGNOSIS — I82442 Acute embolism and thrombosis of left tibial vein: Secondary | ICD-10-CM | POA: Diagnosis not present

## 2016-10-04 DIAGNOSIS — L89312 Pressure ulcer of right buttock, stage 2: Secondary | ICD-10-CM | POA: Diagnosis not present

## 2016-10-04 DIAGNOSIS — I82492 Acute embolism and thrombosis of other specified deep vein of left lower extremity: Secondary | ICD-10-CM | POA: Diagnosis not present

## 2016-10-04 DIAGNOSIS — I82412 Acute embolism and thrombosis of left femoral vein: Secondary | ICD-10-CM | POA: Diagnosis not present

## 2016-10-04 DIAGNOSIS — I82432 Acute embolism and thrombosis of left popliteal vein: Secondary | ICD-10-CM | POA: Diagnosis not present

## 2016-10-05 DIAGNOSIS — I82442 Acute embolism and thrombosis of left tibial vein: Secondary | ICD-10-CM | POA: Diagnosis not present

## 2016-10-05 DIAGNOSIS — L89312 Pressure ulcer of right buttock, stage 2: Secondary | ICD-10-CM | POA: Diagnosis not present

## 2016-10-05 DIAGNOSIS — I82412 Acute embolism and thrombosis of left femoral vein: Secondary | ICD-10-CM | POA: Diagnosis not present

## 2016-10-05 DIAGNOSIS — I82432 Acute embolism and thrombosis of left popliteal vein: Secondary | ICD-10-CM | POA: Diagnosis not present

## 2016-10-10 DIAGNOSIS — E1140 Type 2 diabetes mellitus with diabetic neuropathy, unspecified: Secondary | ICD-10-CM | POA: Diagnosis not present

## 2016-10-10 DIAGNOSIS — L89312 Pressure ulcer of right buttock, stage 2: Secondary | ICD-10-CM | POA: Diagnosis not present

## 2016-10-10 DIAGNOSIS — I82442 Acute embolism and thrombosis of left tibial vein: Secondary | ICD-10-CM | POA: Diagnosis not present

## 2016-10-10 DIAGNOSIS — I82432 Acute embolism and thrombosis of left popliteal vein: Secondary | ICD-10-CM | POA: Diagnosis not present

## 2016-10-10 DIAGNOSIS — I82492 Acute embolism and thrombosis of other specified deep vein of left lower extremity: Secondary | ICD-10-CM | POA: Diagnosis not present

## 2016-10-10 DIAGNOSIS — I82412 Acute embolism and thrombosis of left femoral vein: Secondary | ICD-10-CM | POA: Diagnosis not present

## 2016-10-16 DIAGNOSIS — I82432 Acute embolism and thrombosis of left popliteal vein: Secondary | ICD-10-CM | POA: Diagnosis not present

## 2016-10-16 DIAGNOSIS — L89312 Pressure ulcer of right buttock, stage 2: Secondary | ICD-10-CM | POA: Diagnosis not present

## 2016-10-16 DIAGNOSIS — I82492 Acute embolism and thrombosis of other specified deep vein of left lower extremity: Secondary | ICD-10-CM | POA: Diagnosis not present

## 2016-10-16 DIAGNOSIS — I82442 Acute embolism and thrombosis of left tibial vein: Secondary | ICD-10-CM | POA: Diagnosis not present

## 2016-10-16 DIAGNOSIS — I82412 Acute embolism and thrombosis of left femoral vein: Secondary | ICD-10-CM | POA: Diagnosis not present

## 2016-10-16 DIAGNOSIS — E1140 Type 2 diabetes mellitus with diabetic neuropathy, unspecified: Secondary | ICD-10-CM | POA: Diagnosis not present

## 2016-10-18 ENCOUNTER — Encounter (HOSPITAL_COMMUNITY): Payer: Self-pay

## 2016-10-18 ENCOUNTER — Inpatient Hospital Stay (HOSPITAL_COMMUNITY)
Admission: EM | Admit: 2016-10-18 | Discharge: 2016-10-22 | DRG: 377 | Disposition: A | Discharge status: Home Health | Payer: Medicare Other | Attending: Internal Medicine | Admitting: Internal Medicine

## 2016-10-18 DIAGNOSIS — I82442 Acute embolism and thrombosis of left tibial vein: Secondary | ICD-10-CM | POA: Diagnosis not present

## 2016-10-18 DIAGNOSIS — E039 Hypothyroidism, unspecified: Secondary | ICD-10-CM | POA: Diagnosis present

## 2016-10-18 DIAGNOSIS — Z86718 Personal history of other venous thrombosis and embolism: Secondary | ICD-10-CM

## 2016-10-18 DIAGNOSIS — Z86711 Personal history of pulmonary embolism: Secondary | ICD-10-CM

## 2016-10-18 DIAGNOSIS — R000 Tachycardia, unspecified: Secondary | ICD-10-CM | POA: Diagnosis present

## 2016-10-18 DIAGNOSIS — K648 Other hemorrhoids: Secondary | ICD-10-CM | POA: Diagnosis not present

## 2016-10-18 DIAGNOSIS — I480 Paroxysmal atrial fibrillation: Secondary | ICD-10-CM | POA: Diagnosis present

## 2016-10-18 DIAGNOSIS — I82492 Acute embolism and thrombosis of other specified deep vein of left lower extremity: Secondary | ICD-10-CM | POA: Diagnosis not present

## 2016-10-18 DIAGNOSIS — L89312 Pressure ulcer of right buttock, stage 2: Secondary | ICD-10-CM | POA: Diagnosis not present

## 2016-10-18 DIAGNOSIS — F329 Major depressive disorder, single episode, unspecified: Secondary | ICD-10-CM | POA: Diagnosis present

## 2016-10-18 DIAGNOSIS — I10 Essential (primary) hypertension: Secondary | ICD-10-CM | POA: Diagnosis present

## 2016-10-18 DIAGNOSIS — Z833 Family history of diabetes mellitus: Secondary | ICD-10-CM

## 2016-10-18 DIAGNOSIS — G8929 Other chronic pain: Secondary | ICD-10-CM | POA: Diagnosis present

## 2016-10-18 DIAGNOSIS — E43 Unspecified severe protein-calorie malnutrition: Secondary | ICD-10-CM | POA: Diagnosis present

## 2016-10-18 DIAGNOSIS — R112 Nausea with vomiting, unspecified: Secondary | ICD-10-CM | POA: Diagnosis not present

## 2016-10-18 DIAGNOSIS — F419 Anxiety disorder, unspecified: Secondary | ICD-10-CM | POA: Diagnosis present

## 2016-10-18 DIAGNOSIS — E118 Type 2 diabetes mellitus with unspecified complications: Secondary | ICD-10-CM | POA: Diagnosis present

## 2016-10-18 DIAGNOSIS — Z8249 Family history of ischemic heart disease and other diseases of the circulatory system: Secondary | ICD-10-CM

## 2016-10-18 DIAGNOSIS — Z880 Allergy status to penicillin: Secondary | ICD-10-CM

## 2016-10-18 DIAGNOSIS — R031 Nonspecific low blood-pressure reading: Secondary | ICD-10-CM | POA: Diagnosis not present

## 2016-10-18 DIAGNOSIS — E119 Type 2 diabetes mellitus without complications: Secondary | ICD-10-CM | POA: Diagnosis present

## 2016-10-18 DIAGNOSIS — K649 Unspecified hemorrhoids: Secondary | ICD-10-CM | POA: Diagnosis not present

## 2016-10-18 DIAGNOSIS — M545 Low back pain: Secondary | ICD-10-CM | POA: Diagnosis present

## 2016-10-18 DIAGNOSIS — H409 Unspecified glaucoma: Secondary | ICD-10-CM | POA: Diagnosis present

## 2016-10-18 DIAGNOSIS — R4182 Altered mental status, unspecified: Secondary | ICD-10-CM | POA: Diagnosis not present

## 2016-10-18 DIAGNOSIS — F32A Depression, unspecified: Secondary | ICD-10-CM | POA: Diagnosis present

## 2016-10-18 DIAGNOSIS — Z91041 Radiographic dye allergy status: Secondary | ICD-10-CM

## 2016-10-18 DIAGNOSIS — Z7901 Long term (current) use of anticoagulants: Secondary | ICD-10-CM | POA: Diagnosis not present

## 2016-10-18 DIAGNOSIS — K644 Residual hemorrhoidal skin tags: Secondary | ICD-10-CM | POA: Diagnosis present

## 2016-10-18 DIAGNOSIS — Z681 Body mass index (BMI) 19 or less, adult: Secondary | ICD-10-CM

## 2016-10-18 DIAGNOSIS — K921 Melena: Secondary | ICD-10-CM | POA: Diagnosis not present

## 2016-10-18 DIAGNOSIS — K219 Gastro-esophageal reflux disease without esophagitis: Secondary | ICD-10-CM | POA: Diagnosis present

## 2016-10-18 DIAGNOSIS — E860 Dehydration: Secondary | ICD-10-CM | POA: Diagnosis present

## 2016-10-18 DIAGNOSIS — I82432 Acute embolism and thrombosis of left popliteal vein: Secondary | ICD-10-CM | POA: Diagnosis not present

## 2016-10-18 DIAGNOSIS — R404 Transient alteration of awareness: Secondary | ICD-10-CM | POA: Diagnosis not present

## 2016-10-18 DIAGNOSIS — F17210 Nicotine dependence, cigarettes, uncomplicated: Secondary | ICD-10-CM | POA: Diagnosis present

## 2016-10-18 DIAGNOSIS — J45909 Unspecified asthma, uncomplicated: Secondary | ICD-10-CM | POA: Diagnosis present

## 2016-10-18 DIAGNOSIS — F17200 Nicotine dependence, unspecified, uncomplicated: Secondary | ICD-10-CM | POA: Diagnosis present

## 2016-10-18 DIAGNOSIS — E1140 Type 2 diabetes mellitus with diabetic neuropathy, unspecified: Secondary | ICD-10-CM | POA: Diagnosis not present

## 2016-10-18 DIAGNOSIS — K922 Gastrointestinal hemorrhage, unspecified: Secondary | ICD-10-CM | POA: Diagnosis not present

## 2016-10-18 DIAGNOSIS — Z888 Allergy status to other drugs, medicaments and biological substances status: Secondary | ICD-10-CM

## 2016-10-18 DIAGNOSIS — D649 Anemia, unspecified: Secondary | ICD-10-CM | POA: Diagnosis present

## 2016-10-18 DIAGNOSIS — E872 Acidosis: Secondary | ICD-10-CM | POA: Diagnosis present

## 2016-10-18 DIAGNOSIS — G629 Polyneuropathy, unspecified: Secondary | ICD-10-CM | POA: Diagnosis present

## 2016-10-18 DIAGNOSIS — E785 Hyperlipidemia, unspecified: Secondary | ICD-10-CM | POA: Diagnosis present

## 2016-10-18 DIAGNOSIS — K625 Hemorrhage of anus and rectum: Principal | ICD-10-CM | POA: Diagnosis present

## 2016-10-18 DIAGNOSIS — I959 Hypotension, unspecified: Secondary | ICD-10-CM | POA: Diagnosis present

## 2016-10-18 DIAGNOSIS — R197 Diarrhea, unspecified: Secondary | ICD-10-CM | POA: Diagnosis present

## 2016-10-18 DIAGNOSIS — I82412 Acute embolism and thrombosis of left femoral vein: Secondary | ICD-10-CM | POA: Diagnosis not present

## 2016-10-18 DIAGNOSIS — L89152 Pressure ulcer of sacral region, stage 2: Secondary | ICD-10-CM | POA: Diagnosis present

## 2016-10-18 DIAGNOSIS — K5090 Crohn's disease, unspecified, without complications: Secondary | ICD-10-CM | POA: Diagnosis not present

## 2016-10-18 DIAGNOSIS — Z803 Family history of malignant neoplasm of breast: Secondary | ICD-10-CM

## 2016-10-18 HISTORY — DX: Gastrointestinal hemorrhage, unspecified: K92.2

## 2016-10-18 LAB — CBC WITH DIFFERENTIAL/PLATELET
BASOS ABS: 0 10*3/uL (ref 0.0–0.1)
BASOS PCT: 0 %
EOS PCT: 0 %
Eosinophils Absolute: 0 10*3/uL (ref 0.0–0.7)
HCT: 34.8 % — ABNORMAL LOW (ref 36.0–46.0)
Hemoglobin: 11.5 g/dL — ABNORMAL LOW (ref 12.0–15.0)
LYMPHS PCT: 32 %
Lymphs Abs: 1.7 10*3/uL (ref 0.7–4.0)
MCH: 30.1 pg (ref 26.0–34.0)
MCHC: 33 g/dL (ref 30.0–36.0)
MCV: 91.1 fL (ref 78.0–100.0)
MONO ABS: 0.3 10*3/uL (ref 0.1–1.0)
Monocytes Relative: 6 %
Neutro Abs: 3.2 10*3/uL (ref 1.7–7.7)
Neutrophils Relative %: 62 %
PLATELETS: 280 10*3/uL (ref 150–400)
RBC: 3.82 MIL/uL — ABNORMAL LOW (ref 3.87–5.11)
RDW: 18.5 % — AB (ref 11.5–15.5)
WBC: 5.3 10*3/uL (ref 4.0–10.5)

## 2016-10-18 LAB — URINALYSIS, ROUTINE W REFLEX MICROSCOPIC
Bilirubin Urine: NEGATIVE
GLUCOSE, UA: NEGATIVE mg/dL
HGB URINE DIPSTICK: NEGATIVE
Ketones, ur: 5 mg/dL — AB
Leukocytes, UA: NEGATIVE
Nitrite: NEGATIVE
PH: 5 (ref 5.0–8.0)
Protein, ur: NEGATIVE mg/dL
SPECIFIC GRAVITY, URINE: 1.028 (ref 1.005–1.030)

## 2016-10-18 LAB — BASIC METABOLIC PANEL
ANION GAP: 12 (ref 5–15)
BUN: 11 mg/dL (ref 6–20)
CALCIUM: 8.1 mg/dL — AB (ref 8.9–10.3)
CO2: 19 mmol/L — ABNORMAL LOW (ref 22–32)
Chloride: 109 mmol/L (ref 101–111)
Creatinine, Ser: 0.95 mg/dL (ref 0.44–1.00)
GFR calc Af Amer: >60 mL/min (ref >60)
GLUCOSE: 88 mg/dL (ref 65–99)
Potassium: 3.9 mmol/L (ref 3.5–5.1)
Sodium: 140 mmol/L (ref 135–145)

## 2016-10-18 LAB — I-STAT CG4 LACTIC ACID, ED
LACTIC ACID, VENOUS: 1.03 mmol/L (ref 0.5–1.9)
Lactic Acid, Venous: 2.3 mmol/L (ref 0.5–1.9)

## 2016-10-18 LAB — GLUCOSE, CAPILLARY: GLUCOSE-CAPILLARY: 90 mg/dL (ref 65–99)

## 2016-10-18 LAB — POC OCCULT BLOOD, ED: Fecal Occult Bld: POSITIVE — AB

## 2016-10-18 LAB — PROTIME-INR
INR: 1.25
Prothrombin Time: 15.8 seconds — ABNORMAL HIGH (ref 11.4–15.2)

## 2016-10-18 LAB — AMMONIA: AMMONIA: 23 umol/L (ref 9–35)

## 2016-10-18 LAB — LACTIC ACID, PLASMA: LACTIC ACID, VENOUS: 0.5 mmol/L (ref 0.5–1.9)

## 2016-10-18 LAB — TROPONIN I: TROPONIN I: 0.03 ng/mL — AB (ref <0.03)

## 2016-10-18 MED ORDER — ACETAMINOPHEN 325 MG PO TABS: 650.0000 mg | ORAL_TABLET | Freq: Four times a day (QID) | ORAL | Status: DC | PRN

## 2016-10-18 MED ORDER — ONDANSETRON HCL 4 MG/2ML IJ SOLN
4.0000 mg | Freq: Once | INTRAMUSCULAR | Status: AC
  Administered 2016-10-18: 4 mg via INTRAVENOUS
  Filled 2016-10-18: qty 2

## 2016-10-18 MED ORDER — ONDANSETRON HCL 4 MG/2ML IJ SOLN: 4.0000 mg | Freq: Four times a day (QID) | INTRAMUSCULAR | Status: DC | PRN

## 2016-10-18 MED ORDER — SODIUM CHLORIDE 0.9 % IV SOLN
INTRAVENOUS | Status: DC
  Administered 2016-10-18 – 2016-10-19 (×2): via INTRAVENOUS

## 2016-10-18 MED ORDER — POTASSIUM CHLORIDE CRYS ER 10 MEQ PO TBCR
10.0000 meq | EXTENDED_RELEASE_TABLET | Freq: Every day | ORAL | Status: DC
  Administered 2016-10-18 – 2016-10-22 (×5): 10 meq via ORAL
  Filled 2016-10-18 (×5): qty 1

## 2016-10-18 MED ORDER — SODIUM CHLORIDE 0.9 % IV BOLUS (SEPSIS)
500.0000 mL | Freq: Once | INTRAVENOUS | Status: AC
  Administered 2016-10-18: 500 mL via INTRAVENOUS

## 2016-10-18 MED ORDER — VENLAFAXINE HCL 25 MG PO TABS
25.0000 mg | ORAL_TABLET | Freq: Two times a day (BID) | ORAL | Status: DC
  Administered 2016-10-18 – 2016-10-22 (×7): 25 mg via ORAL
  Filled 2016-10-18 (×9): qty 1

## 2016-10-18 MED ORDER — TRAZODONE HCL 50 MG PO TABS
25.0000 mg | ORAL_TABLET | Freq: Every evening | ORAL | Status: DC | PRN
  Administered 2016-10-21: 25 mg via ORAL
  Filled 2016-10-18: qty 1

## 2016-10-18 MED ORDER — ACETAMINOPHEN 650 MG RE SUPP: 650.0000 mg | Freq: Four times a day (QID) | RECTAL | Status: DC | PRN

## 2016-10-18 MED ORDER — SODIUM CHLORIDE 0.9 % IV BOLUS (SEPSIS)
1000.0000 mL | Freq: Once | INTRAVENOUS | Status: AC
  Administered 2016-10-18: 1000 mL via INTRAVENOUS

## 2016-10-18 MED ORDER — SODIUM CHLORIDE 0.9% FLUSH
3.0000 mL | Freq: Two times a day (BID) | INTRAVENOUS | Status: DC
  Administered 2016-10-18 – 2016-10-22 (×3): 3 mL via INTRAVENOUS

## 2016-10-18 MED ORDER — INSULIN ASPART 100 UNIT/ML ~~LOC~~ SOLN
0.0000 [IU] | Freq: Three times a day (TID) | SUBCUTANEOUS | Status: DC
  Administered 2016-10-20: 2 [IU] via SUBCUTANEOUS
  Administered 2016-10-20: 1 [IU] via SUBCUTANEOUS

## 2016-10-18 MED ORDER — HYDROMORPHONE HCL 2 MG PO TABS
8.0000 mg | ORAL_TABLET | ORAL | Status: DC | PRN
  Administered 2016-10-18: 8 mg via ORAL
  Filled 2016-10-18 (×2): qty 4

## 2016-10-18 MED ORDER — ONDANSETRON HCL 4 MG PO TABS: 4.0000 mg | ORAL_TABLET | Freq: Four times a day (QID) | ORAL | Status: DC | PRN

## 2016-10-18 MED ORDER — LEVOTHYROXINE SODIUM 25 MCG PO TABS
25.0000 ug | ORAL_TABLET | Freq: Every day | ORAL | Status: DC
  Administered 2016-10-19 – 2016-10-22 (×4): 25 ug via ORAL
  Filled 2016-10-18 (×5): qty 1

## 2016-10-18 MED ORDER — SODIUM CHLORIDE 0.9 % IV SOLN: Freq: Once | INTRAVENOUS | Status: DC

## 2016-10-18 NOTE — ED Notes (Signed)
Report to [facility] RN on 5 W     Pt to floor on monitor with [employer] transporting.

## 2016-10-18 NOTE — ED Provider Notes (Signed)
[facility]-EMERGENCY DEPT Provider Note   CSN: [id number] Arrival date & time: 10/18/16  1201     History   Chief Complaint Chief Complaint  Patient presents with  . Altered Mental Status    HPI [name] is a 56 y.o. female. She states that she is here for evaluation of hemorrhoids. Had a evaluation with her primary care physician a month ago for her hemorrhoid. That point was found to be in atrial fibrillation. Evaluated in the emergency room was able to be discharged home.  He states she's had loose stools for the last few days. Has had some rectal pain when she wipes. Nausea with 2 episodes of vomiting pain 100 upon her arrival. Has a tachycardia 109 upon arrival but is afebrile.  Patient has a history of a stage IV 2 decubitus. Recent episode of urosepsis that required admission. History of paroxysmal A. fib on eliquis. HPI  Past Medical History:  Diagnosis Date  . Anxiety   . Arthritis    "a little bit; all over" (05/01/2016)  . Asthma   . Asthma   . C. difficile colitis   . Chronic lower back pain   . Crohn disease (HCC)   . Diabetes mellitus without complication (HCC)   . DVT (deep venous thrombosis) (HCC) ~ 03/2016   LLE  . Elevated lactic acid level   . GERD (gastroesophageal reflux disease)   . Glaucoma   . HLD (hyperlipidemia)   . Hypertension   . Hypothyroidism   . Neuropathy (HCC)   . Pancreatitis   . Pulmonary embolus (HCC) 05/24/2014  . Thrombocytopenia (HCC)   . Tobacco abuse     Patient Active Problem List   Diagnosis Date Noted  . Sacral decubitus ulcer, stage II 08/12/2016  . Altered mental status 08/11/2016  . Protein calorie malnutrition (HCC) 08/11/2016  . Diarrhea 08/11/2016  . Delirium 06/26/2016  . Pressure injury of skin 06/26/2016  . Abdominal pain, generalized   . Abdominal pain   . AKI (acute kidney injury) (HCC)   . Sepsis (HCC)   . UTI (urinary tract infection) 02/28/2016  . 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 Excoriated  rash-medial thighs 02/28/2016  . DVT (deep venous thrombosis), unspecified laterality 01/29/2016  . Bilateral tibial fractures   . Hypothyroidism   . GERD (gastroesophageal reflux disease)   . HLD (hyperlipidemia)   . Enteritis due to Clostridium difficile 03/23/2015  . Thrombocytopenia (HCC) 03/21/2015  . Weakness of back 03/21/2015  . Chronic pain 02/28/2015  . Severe protein-calorie malnutrition (HCC) 12/30/2014  . Hepatic steatosis 12/29/2014  . Chronic diarrhea of unknown origin 12/29/2014  . Elevated lactic acid level   . Dehydration 07/26/2014  . Arterial hypotension 07/26/2014  . History of pulmonary embolus (PE) 05/24/2014  . Physical deconditioning 05/14/2014  . Nicotine abuse 05/08/2014  . Anemia 10/15/2012  . Metabolic acidosis 10/14/2012  . Diabetes mellitus with complication (HCC) 05/12/2007  . Anxiety state 05/12/2007  . Depression 05/12/2007  . Peripheral neuropathy (HCC) 05/12/2007  . HTN (hypertension) 05/12/2007  . ALLERGIC RHINITIS 05/12/2007  . Asthma 05/12/2007  . PANCREATITIS, HX OF 05/12/2007    Past Surgical History:  Procedure Laterality Date  . CESAREAN SECTION  1996  . COLONOSCOPY N/A 03/02/2015   Procedure: COLONOSCOPY;  Surgeon: Bernette Redbird, MD;  Location[facility] ENDOSCOPY;  Service: Endoscopy;  Laterality: N/A;  . ESOPHAGOGASTRODUODENOSCOPY N/A 05/21/2014   Procedure: ESOPHAGOGASTRODUODENOSCOPY (EGD);  Surgeon: Petra Kuba, MD;  Location: [facility] ENDOSCOPY;  Service: Endoscopy;  Laterality:  N/A;  . ESOPHAGOGASTRODUODENOSCOPY N/A 03/02/2015   Procedure: ESOPHAGOGASTRODUODENOSCOPY (EGD);  Surgeon: Bernette Redbird, MD;  Location: [facility] ENDOSCOPY;  Service: Endoscopy;  Laterality: N/A;    OB History    No data available       Home Medications    Prior to Admission medications   Medication Sig Start Date End Date Taking? Authorizing Provider  apixaban (ELIQUIS) 5 MG TABS tablet Take 1 tablet (5 mg total) by mouth 2 (two) times daily. 05/02/16   Richarda Overlie, MD  calcium carbonate (OS-CAL - DOSED IN MG OF ELEMENTAL CALCIUM) 1250 (500 CA) MG tablet Take 1 tablet (500 mg of elemental calcium total) by mouth daily with breakfast. 03/04/15   Alison Murray, MD  diphenoxylate-atropine (LOMOTIL) 2.5-0.025 MG tablet Take 1 tablet by mouth 4 (four) times daily as needed for diarrhea or loose stools. 07/01/16   Dorothea Ogle, MD  feeding supplement, RESOURCE BREEZE, (RESOURCE BREEZE) LIQD Take 1 Container by mouth 3 (three) times daily between meals. 08/02/14   Kathlen Mody, MD  HYDROmorphone (DILAUDID) 4 MG tablet Take 8 mg by mouth every 4 (four) hours as needed for severe pain.    Historical Provider, MD  levothyroxine (SYNTHROID, LEVOTHROID) 25 MCG tablet Take 1 tablet (25 mcg total) by mouth daily before breakfast. 03/06/16   Richarda Overlie, MD  magnesium oxide (MAG-OX) 400 MG tablet Take 1 tablet (400 mg total) by mouth 2 (two) times daily. 08/15/16   Osvaldo Shipper, MD  potassium chloride (MICRO-K) 10 MEQ CR capsule Take 20 mEq by mouth 2 (two) times daily.    Historical Provider, MD  thiamine (VITAMIN B-1) 100 MG tablet Take 1 tablet (100 mg total) by mouth daily. 03/06/16   Richarda Overlie, MD  traZODone (DESYREL) 25 mg TABS tablet Take 0.5 tablets (25 mg total) by mouth at bedtime as needed for sleep. 08/02/14   Kathlen Mody, MD  venlafaxine (EFFEXOR) 25 MG tablet Take 1 tablet (25 mg total) by mouth 2 (two) times daily. 07/01/16   Dorothea Ogle, MD    Family History Family History  Problem Relation Age of Onset  . Breast cancer Mother   . Heart disease Mother 7    CABG  . Diabetes Mother   . Heart disease Father 80    CABG  . Diabetes Father     Social History Social History  Substance Use Topics  . Smoking status: Current Every Day Smoker    Packs/day: 0.25    Years: 37.00    Types: Cigarettes  . Smokeless tobacco: Never Used  . Alcohol use No     Allergies   Iohexol; Spiriva handihaler [tiotropium bromide monohydrate]; Iodinated  diagnostic agents; Ativan [lorazepam]; and Penicillins   Review of Systems Review of Systems  Constitutional: Negative for appetite change, chills, diaphoresis, fatigue and fever.  HENT: Negative for mouth sores, sore throat and trouble swallowing.   Eyes: Negative for visual disturbance.  Respiratory: Negative for cough, chest tightness, shortness of breath and wheezing.   Cardiovascular: Negative for chest pain.  Gastrointestinal: Positive for diarrhea, nausea and vomiting. Negative for abdominal distention and abdominal pain.       Dark stools. Bloody stool today.  Endocrine: Negative for polydipsia, polyphagia and polyuria.  Genitourinary: Negative for dysuria, frequency and hematuria.  Musculoskeletal: Negative for gait problem.  Skin: Negative for color change, pallor and rash.  Neurological: Negative for dizziness, syncope, light-headedness and headaches.  Hematological: Does not bruise/bleed easily.  Psychiatric/Behavioral: Negative for behavioral  problems and confusion.     Physical Exam Updated Vital Signs BP 104/77   Pulse 113   Temp 98.1 F (36.7 C) (Oral)   Resp 12   LMP 10/01/2010 Comment: Has depro  SpO2 100%   Physical Exam  Constitutional: She is oriented to person, place, and time. She appears well-developed and well-nourished. No distress.  Patient is a difficult historian. She is a difficult examination because she screams that everything hurts when you touch it. Her abdomen does not show rigidity. She has guaiac positive stools with "bloody jelly" per nurse's report.  HENT:  Head: Normocephalic.  Eyes: Conjunctivae are normal. Pupils are equal, round, and reactive to light. No scleral icterus.  Neck: Normal range of motion. Neck supple. No thyromegaly present.  Cardiovascular: Normal rate and regular rhythm.  Exam reveals no gallop and no friction rub.   No murmur heard. Pulmonary/Chest: Effort normal and breath sounds normal. No respiratory distress. She  has no wheezes. She has no rales.  Abdominal: Soft. Bowel sounds are normal. She exhibits no distension. There is no tenderness. There is no rebound.  Musculoskeletal: Normal range of motion.  Neurological: She is alert and oriented to person, place, and time.  Skin: Skin is warm and dry. No rash noted.  Psychiatric: She has a normal mood and affect. Her behavior is normal.     ED Treatments / Results  Labs (all labs ordered are listed, but only abnormal results are displayed) Labs Reviewed  CBC WITH DIFFERENTIAL/PLATELET - Abnormal; Notable for the following:       Result Value   RBC 3.82 (*)    Hemoglobin 11.5 (*)    HCT 34.8 (*)    RDW 18.5 (*)    All other components within normal limits  BASIC METABOLIC PANEL - Abnormal; Notable for the following:    CO2 19 (*)    Calcium 8.1 (*)    All other components within normal limits  PROTIME-INR - Abnormal; Notable for the following:    Prothrombin Time 15.8 (*)    All other components within normal limits  TROPONIN I - Abnormal; Notable for the following:    Troponin I 0.03 (*)    All other components within normal limits  URINALYSIS, ROUTINE W REFLEX MICROSCOPIC - Abnormal; Notable for the following:    Ketones, ur 5 (*)    All other components within normal limits  I-STAT CG4 LACTIC ACID, ED - Abnormal; Notable for the following:    Lactic Acid, Venous 2.30 (*)    All other components within normal limits  POC OCCULT BLOOD, ED - Abnormal; Notable for the following:    Fecal Occult Bld POSITIVE (*)    All other components within normal limits  URINE CULTURE  OCCULT BLOOD X 1 CARD TO LAB, STOOL  LACTIC ACID, PLASMA  AMMONIA  I-STAT CG4 LACTIC ACID, ED    EKG  EKG Interpretation None       Radiology No results found.  Procedures Procedures (including critical care time)  Medications Ordered in ED Medications  0.9 %  sodium chloride infusion (not administered)  ondansetron (ZOFRAN) injection 4 mg (not  administered)  sodium chloride 0.9 % bolus 500 mL (0 mLs Intravenous Stopped 10/18/16 1546)  sodium chloride 0.9 % bolus 1,000 mL (1,000 mLs Intravenous New Bag/Given 10/18/16 1547)     Initial Impression / Assessment and Plan / ED Course  I have reviewed the triage vital signs and the nursing notes.  Pertinent labs &  imaging results that were available during my care of the patient were reviewed by me and considered in my medical decision making (see chart for details).     Hypotension improved with fluids. Lactate improves with fluids. No additional vomiting here. Adequate hemoglobin without frank anemia. We'll discussed with admission team regarding admission this patient is on anticoagulation and has bloody stools and hypotension although initially normal hemoglobin.   Final Clinical Impressions(s) / ED Diagnoses   Final diagnoses:  Lower GI bleed    New Prescriptions New Prescriptions   No medications on file     Rolland Porter, MD 10/18/16 1555

## 2016-10-18 NOTE — ED Triage Notes (Signed)
Pt presents with report of hypotension, nausea and vomiting and hemorrhoid bleeding and pain.  Pt refused any treatment by EMS.  CBG: 110, BP: 99/70

## 2016-10-18 NOTE — H&P (Signed)
History and Physical    [name]:[id number] DOB: 1961-09-21 DOA: 10/18/2016  PCP: Georgann Housekeeper, MD   Patient coming from:  home  Chief Complaint: nausea, vomiting, rectal pain, weakness   HPI: [name] is a 56 y.o. female with medical history significant of diabetes mellitus with diabetic neuropathies, hypertension, hyperlipidemia, hypothyroidism, paroxysmal atrial fibrillation, DVT in June 2017, GERD, anemia, urosepsis, Chron disease with diarrhea who presented to the emergency department with complaints several day history of rectal pain after defecation, nausea and vomiting Patient is a difficult historian, slow to respond to questions, but is alert and oriented. At times she closes her eyes and does not answer at all.  ED Course: on arrival patient was afebrile, VS showed tachycardia and she was hypertensive with blood pressure of 82/73 mmHg. Lactic acid was elevated to 2.30, UA was negative for UTI. Patient received a liter of normal saline bolus IV and her blood pressure improved to 110/78 mmHg and HR to 95 bpm Fecal occult blood test was positive x 2,but Hgb  was stable - 11.5 and better than in November 2017 (9.6 ) EKG is pensing  Review of Systems: As per HPI otherwise 10 point review of systems negative.   Ambulatory Status: ambulates with cane  Past Medical History:  Diagnosis Date  . Anxiety   . Arthritis    "a little bit; all over" (05/01/2016)  . Asthma   . Asthma   . C. difficile colitis   . Chronic lower back pain   . Crohn disease (HCC)   . Diabetes mellitus without complication (HCC)   . DVT (deep venous thrombosis) (HCC) ~ 03/2016   LLE  . Elevated lactic acid level   . GERD (gastroesophageal reflux disease)   . Glaucoma   . HLD (hyperlipidemia)   . Hypertension   . Hypothyroidism   . Neuropathy (HCC)   . Pancreatitis   . Pulmonary embolus (HCC) 05/24/2014  . Thrombocytopenia (HCC)   . Tobacco abuse     Past Surgical History:  Procedure  Laterality Date  . CESAREAN SECTION  1996  . COLONOSCOPY N/A 03/02/2015   Procedure: COLONOSCOPY;  Surgeon: Bernette Redbird, MD;  Location: [facility] ENDOSCOPY;  Service: Endoscopy;  Laterality: N/A;  . ESOPHAGOGASTRODUODENOSCOPY N/A 05/21/2014   Procedure: ESOPHAGOGASTRODUODENOSCOPY (EGD);  Surgeon: Petra Kuba, MD;  Location: [facility] ENDOSCOPY;  Service: Endoscopy;  Laterality: N/A;  . ESOPHAGOGASTRODUODENOSCOPY N/A 03/02/2015   Procedure: ESOPHAGOGASTRODUODENOSCOPY (EGD);  Surgeon: Bernette Redbird, MD;  Location: [facility] ENDOSCOPY;  Service: Endoscopy;  Laterality: N/A;    Social History   Social History  . Marital status: Divorced    Spouse name: N/A  . Number of children: N/A  . Years of education: N/A   Occupational History  . Not on file.   Social History Main Topics  . Smoking status: Current Every Day Smoker    Packs/day: 0.25    Years: 37.00    Types: Cigarettes  . Smokeless tobacco: Never Used  . Alcohol use No  . Drug use: No  . Sexual activity: No   Other Topics Concern  . Not on file   Social History Narrative  . No narrative on file    Allergies  Allergen Reactions  . Iohexol Other (See Comments)    "severe burning" Patient has received Contrast in 2005 with 13 hour pre-medication, and had no reaction at that time  . Spiriva Handihaler [Tiotropium Bromide Monohydrate] Nausea And Vomiting  . Iodinated Diagnostic Agents Other (See Comments)  Feels like she is "on fire" for months afterwards, if exposed  . Ativan [Lorazepam] Other (See Comments)    hallucinations  . Penicillins Hives    Has had cephalosporins Has patient had a PCN reaction causing immediate rash, facial/tongue/throat swelling, SOB or lightheadedness with hypotension: Yes Has patient had a PCN reaction causing severe rash involving mucus membranes or skin necrosis: Yes Has patient had a PCN reaction that required hospitalization: Yes Has patient had a PCN reaction occurring within the last 10 years: No If  all of the above answers are "NO", then may proceed with Cephalosporin use.    Family History  Problem Relation Age of Onset  . Breast cancer Mother   . Heart disease Mother 11    CABG  . Diabetes Mother   . Heart disease Father 30    CABG  . Diabetes Father     Prior to Admission medications   Medication Sig Start Date End Date Taking? Authorizing Provider  apixaban (ELIQUIS) 5 MG TABS tablet Take 1 tablet (5 mg total) by mouth 2 (two) times daily. 05/02/16   Richarda Overlie, MD  calcium carbonate (OS-CAL - DOSED IN MG OF ELEMENTAL CALCIUM) 1250 (500 CA) MG tablet Take 1 tablet (500 mg of elemental calcium total) by mouth daily with breakfast. 03/04/15   Alison Murray, MD  diphenoxylate-atropine (LOMOTIL) 2.5-0.025 MG tablet Take 1 tablet by mouth 4 (four) times daily as needed for diarrhea or loose stools. 07/01/16   Dorothea Ogle, MD  feeding supplement, RESOURCE BREEZE, (RESOURCE BREEZE) LIQD Take 1 Container by mouth 3 (three) times daily between meals. 08/02/14   Kathlen Mody, MD  HYDROmorphone (DILAUDID) 4 MG tablet Take 8 mg by mouth every 4 (four) hours as needed for severe pain.    Historical Provider, MD  levothyroxine (SYNTHROID, LEVOTHROID) 25 MCG tablet Take 1 tablet (25 mcg total) by mouth daily before breakfast. 03/06/16   Richarda Overlie, MD  magnesium oxide (MAG-OX) 400 MG tablet Take 1 tablet (400 mg total) by mouth 2 (two) times daily. 08/15/16   Osvaldo Shipper, MD  potassium chloride (MICRO-K) 10 MEQ CR capsule Take 20 mEq by mouth 2 (two) times daily.    Historical Provider, MD  thiamine (VITAMIN B-1) 100 MG tablet Take 1 tablet (100 mg total) by mouth daily. 03/06/16   Richarda Overlie, MD  traZODone (DESYREL) 25 mg TABS tablet Take 0.5 tablets (25 mg total) by mouth at bedtime as needed for sleep. 08/02/14   Kathlen Mody, MD  venlafaxine (EFFEXOR) 25 MG tablet Take 1 tablet (25 mg total) by mouth 2 (two) times daily. 07/01/16   Dorothea Ogle, MD    Physical Exam: Vitals:   10/18/16  1300 10/18/16 1330 10/18/16 1400 10/18/16 1415  BP: 110/78 (!) 88/71 (!) 83/64 104/77  Pulse:      Resp: 18 18 13 12   Temp:      TempSrc:      SpO2:         General: Appears calm and comfortable Eyes: PERRLA, EOMI, normal lids, iris ENT:  grossly normal hearing, lips & tongue, mucous membranes moist and intact Neck: no lymphoadenopathy, masses or thyromegaly Cardiovascular: RRR, no m/r/g. No JVD, carotid bruits. No LE edema.  Respiratory: bilateral no wheezes, rales, rhonchi or cracles. Normal respiratory effort. No accessory muscle use observed Abdomen: soft, non-tender, non-distended, no organomegaly or masses appreciated. BS present in all quadrants Skin: no rash, ulcers or induration seen on limited exam Musculoskeletal: grossly normal  tone BUE/BLE, good ROM, no bony abnormality or joint deformities observed Psychiatric: grossly normal mood and affect, speech fluent and appropriate, alert and oriented x3 Neurologic: CN II-XII grossly intact, moves all extremities in coordinated fashion, sensation intact  Labs on Admission: I have personally reviewed following labs and imaging studies  CBC, BMP  GFR: CrCl cannot be calculated (Unknown ideal weight.).   Creatinine Clearance: CrCl cannot be calculated (Unknown ideal weight.).    Radiological Exams on Admission: No results found.  EKG: Independently reviewed - pending   Assessment/Plan Principal Problem:   GIB (gastrointestinal bleeding) Active Problems:   Diabetes mellitus with complication (HCC)   Depression   Peripheral neuropathy (HCC)   HTN (hypertension)   Anemia   Dehydration   Hypothyroidism   HLD (hyperlipidemia)   GI bleeding - patient has tow positive fecal occult blood tests Hold Eliquis  Monitor Hgb  Will contact GI service for consultation  Hypotension - most likely associated with nausea and diarrhea induced metabolic acidosis and dehydration  Patient improved with hydration Continue to  monitor lactic acid, IV hydration Hold home BP medications  DM type II - diet controlled Continue carb modified diet, SSI if needed, check Hgb A1c  Hypothyroidism Continue Synthroid   Depression/Anxiety Continue Effexor and Trazadone   DVT prophylaxis: SCD Code Status: Full Family Communication: none Disposition Plan: telemetrry Consults called: GI Admission status: inpatient    Raymon Mutton, [place] Pager: [phone] [facility]  If 7PM-7AM, please contact night-coverage [email or web address] Password [email or web address]  10/18/2016, 4:12 PM

## 2016-10-19 DIAGNOSIS — E039 Hypothyroidism, unspecified: Secondary | ICD-10-CM

## 2016-10-19 DIAGNOSIS — K922 Gastrointestinal hemorrhage, unspecified: Secondary | ICD-10-CM

## 2016-10-19 DIAGNOSIS — E118 Type 2 diabetes mellitus with unspecified complications: Secondary | ICD-10-CM

## 2016-10-19 LAB — BASIC METABOLIC PANEL
Anion gap: 12 (ref 5–15)
BUN: 8 mg/dL (ref 6–20)
CHLORIDE: 116 mmol/L — AB (ref 101–111)
CO2: 14 mmol/L — ABNORMAL LOW (ref 22–32)
CREATININE: 0.71 mg/dL (ref 0.44–1.00)
Calcium: 7.5 mg/dL — ABNORMAL LOW (ref 8.9–10.3)
GFR calc Af Amer: >60 mL/min (ref >60)
GFR calc non Af Amer: >60 mL/min (ref >60)
Glucose, Bld: 58 mg/dL — ABNORMAL LOW (ref 65–99)
POTASSIUM: 4.3 mmol/L (ref 3.5–5.1)
SODIUM: 142 mmol/L (ref 135–145)

## 2016-10-19 LAB — CBC
HCT: 32.2 % — ABNORMAL LOW (ref 36.0–46.0)
Hemoglobin: 10.7 g/dL — ABNORMAL LOW (ref 12.0–15.0)
MCH: 30.3 pg (ref 26.0–34.0)
MCHC: 33.2 g/dL (ref 30.0–36.0)
MCV: 91.2 fL (ref 78.0–100.0)
PLATELETS: 264 10*3/uL (ref 150–400)
RBC: 3.53 MIL/uL — AB (ref 3.87–5.11)
RDW: 19 % — ABNORMAL HIGH (ref 11.5–15.5)
WBC: 5.9 10*3/uL (ref 4.0–10.5)

## 2016-10-19 LAB — HEMOGLOBIN A1C: Hgb A1c MFr Bld: <4.2 % — ABNORMAL LOW (ref 4.8–5.6)

## 2016-10-19 LAB — HEMOGLOBIN AND HEMATOCRIT, BLOOD
HEMATOCRIT: 28.5 % — AB (ref 36.0–46.0)
HEMOGLOBIN: 9.8 g/dL — AB (ref 12.0–15.0)

## 2016-10-19 LAB — LACTIC ACID, PLASMA: Lactic Acid, Venous: 0.9 mmol/L (ref 0.5–1.9)

## 2016-10-19 LAB — URINE CULTURE: Culture: NO GROWTH

## 2016-10-19 LAB — GLUCOSE, CAPILLARY
GLUCOSE-CAPILLARY: 80 mg/dL (ref 65–99)
GLUCOSE-CAPILLARY: 76 mg/dL (ref 65–99)
Glucose-Capillary: 71 mg/dL (ref 65–99)
Glucose-Capillary: 92 mg/dL (ref 65–99)

## 2016-10-19 MED ORDER — HYDROMORPHONE HCL 2 MG PO TABS
2.0000 mg | ORAL_TABLET | ORAL | Status: DC | PRN
  Administered 2016-10-19 – 2016-10-22 (×12): 2 mg via ORAL
  Filled 2016-10-19 (×12): qty 1

## 2016-10-19 MED ORDER — ENSURE ENLIVE PO LIQD
237.0000 mL | Freq: Three times a day (TID) | ORAL | Status: DC
  Administered 2016-10-19 – 2016-10-22 (×8): 237 mL via ORAL

## 2016-10-19 MED ORDER — SODIUM CHLORIDE 0.9 % IV SOLN
INTRAVENOUS | Status: DC
  Administered 2016-10-20: 02:00:00 via INTRAVENOUS

## 2016-10-19 MED ORDER — ADULT MULTIVITAMIN W/MINERALS CH
1.0000 | ORAL_TABLET | Freq: Every day | ORAL | Status: DC
  Administered 2016-10-19 – 2016-10-22 (×4): 1 via ORAL
  Filled 2016-10-19 (×4): qty 1

## 2016-10-19 NOTE — Progress Notes (Signed)
PT Cancellation Note  Patient Details Name: JONESHA TSUCHIYA MRN: [id number] DOB: [birth year]   Cancelled Treatment:    Reason Eval/Treat Not Completed: Patient declined, no reason specified. Pt refused PT eval due to "I hurt too bad". PT encouraged pt to get out of bed to relieve pain, pt continues to refuse. Will attempt PT eval again as appropriate.   Rosena Bartle 10/19/2016, 9:33 AM

## 2016-10-19 NOTE — Progress Notes (Signed)
[facility] PROGRESS NOTE  JOSY PEADEN ZOX:[id number] DOB: 1961/01/30 DOA: 10/18/2016 PCP: Georgann Housekeeper, MD  Assessment/Plan: 56 y.o. female with PMH of DM with diabetic neuropathies, HTN, HPL, hypothyroidism, paroxysmal atrial fibrillation, DVT in June 2017 on apixaban, GERD, anemia, urosepsis, Chron disease with diarrhea who presented to the emergency department with complaints several day history of rectal pain after defecation, nausea and vomiting and reported rectal bleeding (guaiac +)  Rectal bleeding. H/o hemorrhoids. Colonoscopy/egd: unremarkable.  -no active bleeding overnight, Hg-stable: 10.7. Will cont monitor h/h, TF as needed. Holding apixaban. Consulted and D/w Dr. Evette Cristal. Questionable may need repeat scoping vs capsule if bleeds    Nausea, vomiting, dehydration, lactic acidosis tachycardia on admission.  -lactic acid resolved with iv fluids. Low bicarb, will cont iv fluids. Monitor urine output, I/o, labs   Questionable h/o DM. Monitor on ISS. Pend ha1c Hypothyroidism, cont levothyroxine.   Hypotension - most likely associated with nausea and diarrhea induced metabolic acidosis and dehydration  -Patient improved with hydration, continue to monitor lactic acid, IV hydration Severe protein calorie malnutrition. Obtain nutrition consultation   Code Status: full Family Communication: d/w patient, GI, RN (indicate person spoken with, relationship, and if by phone, the number) Disposition Plan: pend pt eval   Consultants:  GI  Procedures:  none  Antibiotics:  none (indicate start date, and stop date if known)  HPI/Subjective: Alert, oriented. No distress   Objective: Vitals:   10/19/16 0550 10/19/16 0644  BP: 115/77   Pulse: 89   Resp: (!) 22 18  Temp: 99.2 F (37.3 C)     Intake/Output Summary (Last 24 hours) at 10/19/16 0740 Last data filed at 10/19/16 8119  Gross per 24 hour  Intake             1215 ml  Output                0 ml  Net              1215 ml   Filed Weights   10/18/16 1727 10/19/16 0550  Weight: 37.6 kg (83 lb) 39.9 kg (87 lb 14.4 oz)    Exam:   General:  Comfortable. Looks chronically  iill. Malnutrition   Cardiovascular: s1,s2 rrr  Respiratory: CTA BL  Abdomen: soft, diffuse superficial tender, no rebound   Musculoskeletal: no leg edema    Data Reviewed: Basic Metabolic Panel:  Recent Labs Lab 10/18/16 1321 10/19/16 0638  NA 140 142  K 3.9 4.3  CL 109 116*  CO2 19* 14*  GLUCOSE 88 58*  BUN 11 8  CREATININE 0.95 0.71  CALCIUM 8.1* 7.5*   Liver Function Tests: No results for input(s): AST, ALT, ALKPHOS, BILITOT, PROT, ALBUMIN in the last 168 hours. No results for input(s): LIPASE, AMYLASE in the last 168 hours.  Recent Labs Lab 10/18/16 1631  AMMONIA 23   CBC:  Recent Labs Lab 10/18/16 1321 10/19/16 0112 10/19/16 0638  WBC 5.3  --  5.9  NEUTROABS 3.2  --   --   HGB 11.5* 9.8* 10.7*  HCT 34.8* 28.5* 32.2*  MCV 91.1  --  91.2  PLT 280  --  264   Cardiac Enzymes:  Recent Labs Lab 10/18/16 1321  TROPONINI 0.03*   BNP (last 3 results) No results for input(s): BNP in the last 8760 hours.  ProBNP (last 3 results) No results for input(s): PROBNP in the last 8760 hours.  CBG:  Recent Labs Lab 10/18/16 2151  GLUCAP 90  No results found for this or any previous visit (from the past 240 hour(s)).   Studies: No results found.  Scheduled Meds: . insulin aspart  0-9 Units Subcutaneous TID WC  . levothyroxine  25 mcg Oral QAC breakfast  . potassium chloride  10 mEq Oral Daily  . sodium chloride flush  3 mL Intravenous Q12H  . venlafaxine  25 mg Oral BID   Continuous Infusions: . sodium chloride 100 mL/hr at 10/19/16 0443    Principal Problem:   GIB (gastrointestinal bleeding) Active Problems:   Diabetes mellitus with complication (HCC)   Depression   Peripheral neuropathy (HCC)   HTN (hypertension)   Anemia   Dehydration   Hypothyroidism   HLD  (hyperlipidemia)    Time spent: >35 minutes     Esperanza Sheets  [facility] Pager [phone]. If 7PM-7AM, please contact night-coverage at [email or web address], password [email or web address] 10/19/2016, 7:40 AM  LOS: 1 day

## 2016-10-19 NOTE — Progress Notes (Addendum)
Initial Nutrition Assessment  DOCUMENTATION CODES:   Severe malnutrition in context of chronic illness, Underweight  INTERVENTION:   -Ensure Enlive po TID, each supplement provides 350 kcal and 20 grams of protein -MVI daily  NUTRITION DIAGNOSIS:   Malnutrition related to chronic illness as evidenced by severe depletion of muscle mass, severe depletion of body fat.  GOAL:   Patient will meet greater than or equal to 90% of their needs  MONITOR:   PO intake, Supplement acceptance, Labs, Weight trends, Skin, I & O's  REASON FOR ASSESSMENT:   Consult Assessment of nutrition requirement/status  ASSESSMENT:   [name] is a 56 y.o. female with a past medical history significant for persistent esophageal fistula and recent complicated hospitalization at [facility] for esophagectomy c/b VAP, ABLA, persistent ?pancreatic leak and candida surgical site infection who presents with 2-3 days worsening left sided pain, productive cough and chills.  Pt admitted with GIB.   Spoke with pt at bedside, who was very lethargic and selective to answering questions. Pt relied to most questions with "mm-hmm" or "yeah" but did not participate fully in interview.   Breakfast tray at bedside was untouched.   Abbreviated Nutrition-Focused physical exam completed. Findings are severe fat depletion, severe muscle depletion, and no edema. Pt refused exam on lower body, citing severe pain.  Pt with increased nutrient needs related to malnutrition, underweight status, and pressure injuries. RD will add supplements to optimize nutritional status.   Labs reviewed.   Diet Order:  Diet Heart Room service appropriate? Yes; Fluid consistency: Thin Diet heart healthy/carb modified Room service appropriate? Yes; Fluid consistency: Thin  Skin:  Wound (see comment) (stage II sacrum)  Last BM:  10/18/16  Height:   Ht Readings from Last 1 Encounters:  10/18/16 5\' 5"  (1.651 m)    Weight:   Wt Readings  from Last 1 Encounters:  10/19/16 87 lb 14.4 oz (39.9 kg)    Ideal Body Weight:  56.8 kg  BMI:  Body mass index is 14.63 kg/m.  Estimated Nutritional Needs:   Kcal:  1300-1500  Protein:  55-70 grams  Fluid:  >1.3 L  EDUCATION NEEDS:   Education needs addressed  Ione Sandusky A. Mayford Knife, RD, LDN, CDE Pager: [phone] After hours Pager: [phone]

## 2016-10-19 NOTE — Consult Note (Signed)
Subjective:   HPI  The patient is a 56 year old female with multiple medical problems who states that she has been experiencing diarrhea for the past week. She has also noticed some red blood in the diarrhea as well. Her stools were checked and were heme positive. She is on  Eliquis and the hospitalist was concerned because of the bleeding and heme positive stool. She does have a questionable history of Crohn's disease from my review of the record and when I asked her about this she is not very clear on the details. She is not on any specific medication for that. She tells me that she has a doctor for this but doesn't remember his name. She had a normal colonoscopy about a year and a half ago as well as a normal upper endoscopy.  Review of Systems Denies chest pain or shortness of breath. She does complain of some nausea and vomiting.  Past Medical History:  Diagnosis Date  . Anxiety   . Arthritis    "a little bit; all over" (05/01/2016)  . Asthma   . Asthma   . C. difficile colitis   . Chronic lower back pain   . Crohn disease (HCC)   . Diabetes mellitus without complication (HCC)   . DVT (deep venous thrombosis) (HCC) ~ 03/2016   LLE  . Elevated lactic acid level   . GERD (gastroesophageal reflux disease)   . GI bleed 10/18/2016  . Glaucoma   . HLD (hyperlipidemia)   . Hypertension   . Hypothyroidism   . Neuropathy (HCC)   . Pancreatitis   . Pulmonary embolus (HCC) 05/24/2014  . Thrombocytopenia (HCC)   . Tobacco abuse    Past Surgical History:  Procedure Laterality Date  . CESAREAN SECTION  1996  . COLONOSCOPY N/A 03/02/2015   Procedure: COLONOSCOPY;  Surgeon: Bernette Redbird, MD;  Location: [facility] ENDOSCOPY;  Service: Endoscopy;  Laterality: N/A;  . ESOPHAGOGASTRODUODENOSCOPY N/A 05/21/2014   Procedure: ESOPHAGOGASTRODUODENOSCOPY (EGD);  Surgeon: Petra Kuba, MD;  Location: [facility] ENDOSCOPY;  Service: Endoscopy;  Laterality: N/A;  . ESOPHAGOGASTRODUODENOSCOPY N/A 03/02/2015   Procedure:  ESOPHAGOGASTRODUODENOSCOPY (EGD);  Surgeon: Bernette Redbird, MD;  Location: [facility] ENDOSCOPY;  Service: Endoscopy;  Laterality: N/A;   Social History   Social History  . Marital status: Divorced    Spouse name: N/A  . Number of children: N/A  . Years of education: N/A   Occupational History  . Not on file.   Social History Main Topics  . Smoking status: Current Every Day Smoker    Packs/day: 0.25    Years: 37.00    Types: Cigarettes  . Smokeless tobacco: Never Used  . Alcohol use No  . Drug use: No  . Sexual activity: No   Other Topics Concern  . Not on file   Social History Narrative  . No narrative on file   family history includes Breast cancer in her mother; Diabetes in her father and mother; Heart disease (age of onset: 1) in her father and mother.  Current Facility-Administered Medications:  .  0.9 %  sodium chloride infusion, , Intravenous, Continuous, Esperanza Sheets, MD, Last Rate: 100 mL/hr at 10/19/16 0815 .  acetaminophen (TYLENOL) tablet 650 mg, 650 mg, Oral, Q6H PRN **OR** acetaminophen (TYLENOL) suppository 650 mg, 650 mg, Rectal, Q6H PRN, Isaiah Blakes, PA-C .  HYDROmorphone (DILAUDID) tablet 2 mg, 2 mg, Oral, Q4H PRN, Esperanza Sheets, MD .  insulin aspart (novoLOG) injection 0-9 Units, 0-9 Units, Subcutaneous, TID WC,  Isaiah Blakes, PA-C .  levothyroxine (SYNTHROID, LEVOTHROID) tablet 25 mcg, 25 mcg, Oral, QAC breakfast, Isaiah Blakes, PA-C, 25 mcg at 10/19/16 0844 .  ondansetron (ZOFRAN) tablet 4 mg, 4 mg, Oral, Q6H PRN **OR** ondansetron (ZOFRAN) injection 4 mg, 4 mg, Intravenous, Q6H PRN, Isaiah Blakes, PA-C .  potassium chloride (K-DUR,KLOR-CON) CR tablet 10 mEq, 10 mEq, Oral, Daily, Isaiah Blakes, PA-C, 10 mEq at 10/19/16 0844 .  sodium chloride flush (NS) 0.9 % injection 3 mL, 3 mL, Intravenous, Q12H, [employer], PA-C, 3 mL at 10/18/16 2200 .  traZODone (DESYREL) tablet 25 mg, 25 mg, Oral, QHS PRN, Isaiah Blakes,  PA-C .  venlafaxine [facility]) tablet 25 mg, 25 mg, Oral, BID, Isaiah Blakes, PA-C, 25 mg at 10/19/16 0845 Allergies  Allergen Reactions  . Iohexol Other (See Comments)    "severe burning" Patient has received Contrast in 2005 with 13 hour pre-medication, and had no reaction at that time  . Spiriva Handihaler [Tiotropium Bromide Monohydrate] Nausea And Vomiting  . Iodinated Diagnostic Agents Other (See Comments)    Feels like she is "on fire" for months afterwards, if exposed  . Ativan [Lorazepam] Other (See Comments)    hallucinations  . Penicillins Hives    Has had cephalosporins Has patient had a PCN reaction causing immediate rash, facial/tongue/throat swelling, SOB or lightheadedness with hypotension: Yes Has patient had a PCN reaction causing severe rash involving mucus membranes or skin necrosis: Yes Has patient had a PCN reaction that required hospitalization: Yes Has patient had a PCN reaction occurring within the last 10 years: No If all of the above answers are "NO", then may proceed with Cephalosporin use.     Objective:     BP 115/77 (BP Location: Left Arm)   Pulse 89   Temp 99.2 F (37.3 C)   Resp 18   Ht 5\' 5"  (1.651 m)   Wt 39.9 kg (87 lb 14.4 oz)   LMP 10/01/2010 Comment: Has depro  SpO2 100%   BMI 14.63 kg/m   She is in no distress  Nonicteric  Heart regular rhythm no murmurs  Lungs clear  Abdomen: Bowel sounds normal, soft, mild discomfort in the mid and lower abdomen but no rebound or guarding  Laboratory No components found for: D1    Assessment:     Diarrhea for the past week  Rectal bleeding  Multiple medical problems as mentioned above      Plan:     In view of the history of diarrhea for the past week I would recommend checking a GI pathogens panel first. She may need another colonoscopy to further investigate the diarrhea and rectal bleeding. Lab Results  Component Value Date   HGB 10.7 (L) 10/19/2016   HGB 9.8 (L)  10/19/2016   HGB 11.5 (L) 10/18/2016   HCT 32.2 (L) 10/19/2016   HCT 28.5 (L) 10/19/2016   HCT 34.8 (L) 10/18/2016   HCT 33.8 (L) 08/11/2016   ALKPHOS 48 08/13/2016   ALKPHOS 64 08/12/2016   ALKPHOS 80 08/11/2016   AST 34 08/13/2016   AST 38 08/12/2016   AST 29 08/11/2016   ALT 13 (L) 08/13/2016   ALT 13 (L) 08/12/2016   ALT 13 (L) 08/11/2016   AMYLASE 141 (H) 07/31/2011

## 2016-10-20 LAB — GASTROINTESTINAL PANEL BY PCR, STOOL (REPLACES STOOL CULTURE)
ADENOVIRUS F40/41: NOT DETECTED
Astrovirus: NOT DETECTED
CAMPYLOBACTER SPECIES: NOT DETECTED
CRYPTOSPORIDIUM: NOT DETECTED
CYCLOSPORA CAYETANENSIS: NOT DETECTED
ENTEROPATHOGENIC E COLI (EPEC): NOT DETECTED
Entamoeba histolytica: NOT DETECTED
Enteroaggregative E coli (EAEC): NOT DETECTED
Enterotoxigenic E coli (ETEC): NOT DETECTED
Giardia lamblia: NOT DETECTED
Norovirus GI/GII: NOT DETECTED
PLESIMONAS SHIGELLOIDES: NOT DETECTED
Rotavirus A: NOT DETECTED
SAPOVIRUS (I, II, IV, AND V): NOT DETECTED
Salmonella species: NOT DETECTED
Shiga like toxin producing E coli (STEC): NOT DETECTED
Shigella/Enteroinvasive E coli (EIEC): NOT DETECTED
VIBRIO SPECIES: NOT DETECTED
Vibrio cholerae: NOT DETECTED
YERSINIA ENTEROCOLITICA: NOT DETECTED

## 2016-10-20 LAB — CBC
HEMATOCRIT: 31.8 % — AB (ref 36.0–46.0)
HEMOGLOBIN: 10.5 g/dL — AB (ref 12.0–15.0)
MCH: 30.2 pg (ref 26.0–34.0)
MCHC: 33 g/dL (ref 30.0–36.0)
MCV: 91.4 fL (ref 78.0–100.0)
Platelets: 284 10*3/uL (ref 150–400)
RBC: 3.48 MIL/uL — ABNORMAL LOW (ref 3.87–5.11)
RDW: 18.8 % — ABNORMAL HIGH (ref 11.5–15.5)
WBC: 7.1 10*3/uL (ref 4.0–10.5)

## 2016-10-20 LAB — BASIC METABOLIC PANEL
Anion gap: 13 (ref 5–15)
BUN: 5 mg/dL — AB (ref 6–20)
CO2: 15 mmol/L — AB (ref 22–32)
CREATININE: 0.74 mg/dL (ref 0.44–1.00)
Calcium: 7.7 mg/dL — ABNORMAL LOW (ref 8.9–10.3)
Chloride: 115 mmol/L — ABNORMAL HIGH (ref 101–111)
GFR calc Af Amer: >60 mL/min (ref >60)
GFR calc non Af Amer: >60 mL/min (ref >60)
Glucose, Bld: 92 mg/dL (ref 65–99)
Potassium: 4.1 mmol/L (ref 3.5–5.1)
Sodium: 143 mmol/L (ref 135–145)

## 2016-10-20 LAB — GLUCOSE, CAPILLARY
GLUCOSE-CAPILLARY: 91 mg/dL (ref 65–99)
Glucose-Capillary: 171 mg/dL — ABNORMAL HIGH (ref 65–99)
Glucose-Capillary: 132 mg/dL — ABNORMAL HIGH (ref 65–99)
Glucose-Capillary: 95 mg/dL (ref 65–99)

## 2016-10-20 LAB — TSH: TSH: 1.692 u[IU]/mL (ref 0.350–4.500)

## 2016-10-20 MED ORDER — METOPROLOL TARTRATE 12.5 MG HALF TABLET
12.5000 mg | ORAL_TABLET | Freq: Two times a day (BID) | ORAL | Status: DC
  Administered 2016-10-20 – 2016-10-22 (×5): 12.5 mg via ORAL
  Filled 2016-10-20 (×5): qty 1

## 2016-10-20 MED ORDER — HYDROCORTISONE 2.5 % RE CREA
TOPICAL_CREAM | Freq: Two times a day (BID) | RECTAL | Status: DC
  Administered 2016-10-21: 11:00:00 via RECTAL
  Filled 2016-10-20: qty 28.35

## 2016-10-20 MED ORDER — HYDROCORTISONE 2.5 % RE CREA
TOPICAL_CREAM | Freq: Four times a day (QID) | RECTAL | Status: DC
  Administered 2016-10-20 – 2016-10-21 (×4): via TOPICAL
  Filled 2016-10-20: qty 28.35

## 2016-10-20 MED ORDER — LACTATED RINGERS IV SOLN
INTRAVENOUS | Status: DC
  Administered 2016-10-20 – 2016-10-22 (×3): via INTRAVENOUS

## 2016-10-20 NOTE — Progress Notes (Signed)
PT Cancellation Note  Patient Details Name: [name] MRN: [id number] DOB: [birth year]   Cancelled Treatment:    Reason Eval/Treat Not Completed: Patient declined, no reason specified  Patient refuses all attempts for mobility, excuses from "my nose is cold" to "hemmorroids and back pain".  States she wants to cooperate, and states she needs more help at home, wants "NO Rehab center", but refusing all assessment.  Will try to attempt again as time permits.   Freida Busman, [name] 10/20/2016, 9:32 AM

## 2016-10-20 NOTE — Progress Notes (Signed)
[facility] Progress Note  Subjective: No complaints, no bowel movements since last night. Complaining of external hemorrhoid irritation.  Objective: Vital signs in last 24 hours: Temp:  [98.5 F (36.9 C)-99.4 F (37.4 C)] 99.4 F (37.4 C) (01/20 0506) Pulse Rate:  [68-127] 107 (01/20 0506) Resp:  [17-18] 17 (01/20 0506) BP: (118-192)/(48-89) 119/82 (01/20 0506) SpO2:  [82 %-100 %] 99 % (01/20 0506) Weight:  [39.5 kg (87 lb 1 oz)] 39.5 kg (87 lb 1 oz) (01/20 0500) Weight change: 1.843 kg (4 lb 1 oz)   PE: Unchanged  Lab Results: Results for orders placed or performed during the hospital encounter of 10/18/16 (from the past 24 hour(s))  Glucose, capillary     Status: None   Collection Time: 10/19/16  2:28 PM  Result Value Ref Range   Glucose-Capillary 80 65 - 99 mg/dL  Glucose, capillary     Status: None   Collection Time: 10/19/16  5:26 PM  Result Value Ref Range   Glucose-Capillary 76 65 - 99 mg/dL  Glucose, capillary     Status: None   Collection Time: 10/19/16  9:41 PM  Result Value Ref Range   Glucose-Capillary 92 65 - 99 mg/dL  CBC     Status: Abnormal   Collection Time: 10/20/16  5:41 AM  Result Value Ref Range   WBC 7.1 4.0 - 10.5 K/uL   RBC 3.48 (L) 3.87 - 5.11 MIL/uL   Hemoglobin 10.5 (L) 12.0 - 15.0 g/dL   HCT 16.1 (L) 09.6 - 04.5 %   MCV 91.4 78.0 - 100.0 fL   MCH 30.2 26.0 - 34.0 pg   MCHC 33.0 30.0 - 36.0 g/dL   RDW 40.9 (H) 81.1 - 91.4 %   Platelets 284 150 - 400 K/uL  Basic metabolic panel     Status: Abnormal   Collection Time: 10/20/16  5:41 AM  Result Value Ref Range   Sodium 143 135 - 145 mmol/L   Potassium 4.1 3.5 - 5.1 mmol/L   Chloride 115 (H) 101 - 111 mmol/L   CO2 15 (L) 22 - 32 mmol/L   Glucose, Bld 92 65 - 99 mg/dL   BUN 5 (L) 6 - 20 mg/dL   Creatinine, Ser 7.82 0.44 - 1.00 mg/dL   Calcium 7.7 (L) 8.9 - 10.3 mg/dL   GFR calc non Af Amer >60 >60 mL/min   GFR calc Af Amer >60 >60 mL/min   Anion gap 13 5 - 15  Glucose,  capillary     Status: Abnormal   Collection Time: 10/20/16  7:53 AM  Result Value Ref Range   Glucose-Capillary 171 (H) 65 - 99 mg/dL    Studies/Results: No results found.    Assessment: Rectal bleeding and diarrhea, on Eliquis, eating apparently low-grade with stable hemoglobin and recent unrevealing EGD and colonoscopy.  Plan: Anusol HC cream Await GI pathogens panel.    Clerance Umland C 10/20/2016, 10:47 AM  Pager [phone] If no answer or after 5 PM call [phone]

## 2016-10-20 NOTE — Progress Notes (Signed)
PROGRESS NOTE  [name] TGY:[id number] DOB: Jun 13, 1961 DOA: 10/18/2016 PCP: Georgann Housekeeper, MD   LOS: 2 days   Brief Narrative: 56 y.o.femalewith PMH of DM with diabetic neuropathies, HTN, HPL, hypothyroidism, paroxysmal atrial fibrillation, DVT in June 2017 on apixaban, GERD, anemia, urosepsis, Chrondisease with diarrhea who presentedto the emergency department with complaints several day history of rectal pain after defecation, nausea and vomiting and reported rectal bleeding (guaiac +)  Assessment & Plan: Principal Problem:   GIB (gastrointestinal bleeding) Active Problems:   Diabetes mellitus with complication (HCC)   Depression   Peripheral neuropathy (HCC)   HTN (hypertension)   Anemia   Dehydration   Hypothyroidism   HLD (hyperlipidemia)   Rectal bleeding - patient wit history of hemorrhoids. She had Colonoscopy and EGD 1-2 years ago which were essentially normal. This is currently resolved. Her hemoglobin is stable. Gastroenterology following, appreciate input. Continue to hold Eliquis but will need to resume at one point for her history of DVT.   Nausea, vomiting, dehydration, lactic acidosis - lactic acidosis resolved with IV fluids, she is a persistently low bicarbonate in the setting of GI losses, discontinue normal saline and place patient on Ringer's lactate.  PE/DVT - in April 2017, on Eliquis. Now on hold, resume when OK with GI  Questionable h/o DM - Monitor on ISS. A1C < 4.2  Hypothyroidism - continue levothyroxine.   Hypotension - most likely associated with nausea and diarrhea induced metabolic acidosis and dehydration, improved with fluids.   Severe protein calorie malnutrition - nutrition consultation   Hypothyroidism - continue synthroid    DVT prophylaxis: SCDs Code Status: Full code Family Communication: no family at bedside Disposition Plan: TBD, home when ready  Consultants:   GI  Procedures:   None  Antimicrobials:  None     Subjective: - diarrhea improving, no abdominal pain. Complains of rectal pain. No chest pain / shortness of breath.   Objective: Vitals:   10/20/16 0107 10/20/16 0447 10/20/16 0500 10/20/16 0506  BP: (!) 177/48 124/74  119/82  Pulse: 69 87  (!) 107  Resp:    17  Temp:  98.5 F (36.9 C)  99.4 F (37.4 C)  TempSrc:  Oral    SpO2:  95%  99%  Weight:   39.5 kg (87 lb 1 oz)   Height:        Intake/Output Summary (Last 24 hours) at 10/20/16 0941 Last data filed at 10/19/16 1414  Gross per 24 hour  Intake               50 ml  Output                0 ml  Net               50 ml   Filed Weights   10/18/16 1727 10/19/16 0550 10/20/16 0500  Weight: 37.6 kg (83 lb) 39.9 kg (87 lb 14.4 oz) 39.5 kg (87 lb 1 oz)    Examination: Constitutional: NAD Vitals:   10/20/16 0107 10/20/16 0447 10/20/16 0500 10/20/16 0506  BP: (!) 177/48 124/74  119/82  Pulse: 69 87  (!) 107  Resp:    17  Temp:  98.5 F (36.9 C)  99.4 F (37.4 C)  TempSrc:  Oral    SpO2:  95%  99%  Weight:   39.5 kg (87 lb 1 oz)   Height:       Eyes: PERRL, lids and conjunctivae normal ENMT: Mucous membranes are  moist.  Respiratory: clear to auscultation bilaterally, no wheezing, no crackles.  Cardiovascular: Regular rate and rhythm, no murmurs / rubs / gallops. No LE edema.  Abdomen: no tenderness. Bowel sounds positive.  Neurologic: non focal    Data Reviewed: I have personally reviewed following labs and imaging studies  CBC:  Recent Labs Lab 10/18/16 1321 10/19/16 0112 10/19/16 0638 10/20/16 0541  WBC 5.3  --  5.9 7.1  NEUTROABS 3.2  --   --   --   HGB 11.5* 9.8* 10.7* 10.5*  HCT 34.8* 28.5* 32.2* 31.8*  MCV 91.1  --  91.2 91.4  PLT 280  --  264 284   Basic Metabolic Panel:  Recent Labs Lab 10/18/16 1321 10/19/16 0638 10/20/16 0541  NA 140 142 143  K 3.9 4.3 4.1  CL 109 116* 115*  CO2 19* 14* 15*  GLUCOSE 88 58* 92  BUN 11 8 5*  CREATININE 0.95 0.71 0.74  CALCIUM 8.1* 7.5* 7.7*    GFR: Estimated Creatinine Clearance: 48.4 mL/min (by C-G formula based on SCr of 0.74 mg/dL). Liver Function Tests: No results for input(s): AST, ALT, ALKPHOS, BILITOT, PROT, ALBUMIN in the last 168 hours. No results for input(s): LIPASE, AMYLASE in the last 168 hours.  Recent Labs Lab 10/18/16 1631  AMMONIA 23   Coagulation Profile:  Recent Labs Lab 10/18/16 1321  INR 1.25   Cardiac Enzymes:  Recent Labs Lab 10/18/16 1321  TROPONINI 0.03*   BNP (last 3 results) No results for input(s): PROBNP in the last 8760 hours. HbA1C:  Recent Labs  10/18/16 1801  HGBA1C <4.2*   CBG:  Recent Labs Lab 10/19/16 0800 10/19/16 1428 10/19/16 1726 10/19/16 2141 10/20/16 0753  GLUCAP 71 80 76 92 171*   Lipid Profile: No results for input(s): CHOL, HDL, LDLCALC, TRIG, CHOLHDL, LDLDIRECT in the last 72 hours. Thyroid Function Tests: No results for input(s): TSH, T4TOTAL, FREET4, T3FREE, THYROIDAB in the last 72 hours. Anemia Panel: No results for input(s): VITAMINB12, FOLATE, FERRITIN, TIBC, IRON, RETICCTPCT in the last 72 hours. Urine analysis:    Component Value Date/Time   COLORURINE YELLOW 10/18/2016 1415   APPEARANCEUR CLEAR 10/18/2016 1415   LABSPEC 1.028 10/18/2016 1415   PHURINE 5.0 10/18/2016 1415   GLUCOSEU NEGATIVE 10/18/2016 1415   HGBUR NEGATIVE 10/18/2016 1415   BILIRUBINUR NEGATIVE 10/18/2016 1415   KETONESUR 5 (A) 10/18/2016 1415   PROTEINUR NEGATIVE 10/18/2016 1415   UROBILINOGEN 1.0 02/28/2015 1227   NITRITE NEGATIVE 10/18/2016 1415   LEUKOCYTESUR NEGATIVE 10/18/2016 1415   Sepsis Labs: Invalid input(s): PROCALCITONIN, LACTICIDVEN  Recent Results (from the past 240 hour(s))  Urine culture     Status: None   Collection Time: 10/18/16  2:15 PM  Result Value Ref Range Status   Specimen Description URINE, CATHETERIZED  Final   Special Requests NONE  Final   Culture NO GROWTH  Final   Report Status 10/19/2016 FINAL  Final      Radiology  Studies: No results found.   Scheduled Meds: . feeding supplement (ENSURE ENLIVE)  237 mL Oral TID BM  . hydrocortisone   Topical QID  . insulin aspart  0-9 Units Subcutaneous TID WC  . levothyroxine  25 mcg Oral QAC breakfast  . multivitamin with minerals  1 tablet Oral Daily  . potassium chloride  10 mEq Oral Daily  . sodium chloride flush  3 mL Intravenous Q12H  . venlafaxine  25 mg Oral BID   Continuous Infusions:   Pamella Pert,  MD, PhD [facility] Pager [phone]  If 7PM-7AM, please contact night-coverage [email or web address] Password [facility] 10/20/2016, 9:41 AM

## 2016-10-20 NOTE — Progress Notes (Signed)
Notified Dr. Elvera Lennox of patient's increase in heart rate.

## 2016-10-21 LAB — BASIC METABOLIC PANEL
ANION GAP: 7 (ref 5–15)
BUN: <5 mg/dL — ABNORMAL LOW (ref 6–20)
CHLORIDE: 106 mmol/L (ref 101–111)
CO2: 23 mmol/L (ref 22–32)
Calcium: 7.6 mg/dL — ABNORMAL LOW (ref 8.9–10.3)
Creatinine, Ser: 0.53 mg/dL (ref 0.44–1.00)
GFR calc non Af Amer: >60 mL/min (ref >60)
Glucose, Bld: 90 mg/dL (ref 65–99)
POTASSIUM: 3.5 mmol/L (ref 3.5–5.1)
SODIUM: 136 mmol/L (ref 135–145)

## 2016-10-21 LAB — CBC
HEMATOCRIT: 31 % — AB (ref 36.0–46.0)
HEMOGLOBIN: 10.6 g/dL — AB (ref 12.0–15.0)
MCH: 30.5 pg (ref 26.0–34.0)
MCHC: 34.2 g/dL (ref 30.0–36.0)
MCV: 89.1 fL (ref 78.0–100.0)
Platelets: 262 10*3/uL (ref 150–400)
RBC: 3.48 MIL/uL — AB (ref 3.87–5.11)
RDW: 18.2 % — ABNORMAL HIGH (ref 11.5–15.5)
WBC: 6.6 10*3/uL (ref 4.0–10.5)

## 2016-10-21 LAB — GLUCOSE, CAPILLARY
GLUCOSE-CAPILLARY: 94 mg/dL (ref 65–99)
GLUCOSE-CAPILLARY: 91 mg/dL (ref 65–99)
GLUCOSE-CAPILLARY: 111 mg/dL — AB (ref 65–99)

## 2016-10-21 NOTE — Progress Notes (Signed)
PROGRESS NOTE  [name] NWG:[id number] DOB: 11-13-1960 DOA: 10/18/2016 PCP: Georgann Housekeeper, MD   LOS: 3 days   Brief Narrative: 56 y.o.femalewith PMH of DM with diabetic neuropathies, HTN, HPL, hypothyroidism, paroxysmal atrial fibrillation, DVT in June 2017 on apixaban, GERD, anemia, urosepsis, Chrondisease with diarrhea who presentedto the emergency department with complaints several day history of rectal pain after defecation, nausea and vomiting and reported rectal bleeding (guaiac +)  Assessment & Plan: Principal Problem:   GIB (gastrointestinal bleeding) Active Problems:   Diabetes mellitus with complication (HCC)   Depression   Peripheral neuropathy (HCC)   HTN (hypertension)   Anemia   Dehydration   Hypothyroidism   HLD (hyperlipidemia)   Rectal bleeding - patient wit history of hemorrhoids. She had Colonoscopy and EGD 1-2 years ago which were essentially normal. This is currently resolved. Her hemoglobin is stable. Gastroenterology following, appreciate input. Continue to hold Eliquis but will need to resume at one point for her history of DVT. GI pathogen panel negative.   Nausea, vomiting, dehydration, lactic acidosis - lactic acidosis resolved with IV fluids, she is a persistently low bicarbonate in the setting of GI losses, discontinued normal saline and place patient on Ringer's lactate. Acidosis resolved.  PE/DVT - in April 2017, on Eliquis. Now on hold, resume when OK with GI  Questionable h/o DM - Monitor on ISS. A1C < 4.2  Hypothyroidism - continue levothyroxine.   Hypotension - most likely associated with nausea and diarrhea induced metabolic acidosis and dehydration, improved with fluids.   Severe protein calorie malnutrition - nutrition consultation   Hypothyroidism - continue synthroid    DVT prophylaxis: SCDs Code Status: Full code Family Communication: no family at bedside Disposition Plan: TBD, home when ready  Consultants:    GI  Procedures:   None  Antimicrobials:  None    Subjective: - diarrhea improving, no abdominal pain. Complains of rectal pain. No chest pain / shortness of breath.   Objective: Vitals:   10/20/16 1541 10/20/16 2037 10/21/16 0551 10/21/16 1100  BP: 111/76 118/82 97/74 115/69  Pulse: 84 88 88 89  Resp: 18 16 17    Temp: 98.3 F (36.8 C) 99.2 F (37.3 C) 98.5 F (36.9 C)   TempSrc:  Oral Oral   SpO2: 100% 99% 99%   Weight:   39.7 kg (87 lb 8 oz)   Height:        Intake/Output Summary (Last 24 hours) at 10/21/16 1426 Last data filed at 10/20/16 1836  Gross per 24 hour  Intake           513.75 ml  Output                0 ml  Net           513.75 ml   Filed Weights   10/19/16 0550 10/20/16 0500 10/21/16 0551  Weight: 39.9 kg (87 lb 14.4 oz) 39.5 kg (87 lb 1 oz) 39.7 kg (87 lb 8 oz)    Examination: Constitutional: NAD Vitals:   10/20/16 1541 10/20/16 2037 10/21/16 0551 10/21/16 1100  BP: 111/76 118/82 97/74 115/69  Pulse: 84 88 88 89  Resp: 18 16 17    Temp: 98.3 F (36.8 C) 99.2 F (37.3 C) 98.5 F (36.9 C)   TempSrc:  Oral Oral   SpO2: 100% 99% 99%   Weight:   39.7 kg (87 lb 8 oz)   Height:       Eyes: PERRL, lids and conjunctivae normal  ENMT: Mucous membranes are moist.  Respiratory: clear to auscultation bilaterally, no wheezing, no crackles.  Cardiovascular: Regular rate and rhythm, no murmurs / rubs / gallops. No LE edema.  Abdomen: no tenderness. Bowel sounds positive.  Neurologic: non focal    Data Reviewed: I have personally reviewed following labs and imaging studies  CBC:  Recent Labs Lab 10/18/16 1321 10/19/16 0112 10/19/16 0638 10/20/16 0541 10/21/16 0619  WBC 5.3  --  5.9 7.1 6.6  NEUTROABS 3.2  --   --   --   --   HGB 11.5* 9.8* 10.7* 10.5* 10.6*  HCT 34.8* 28.5* 32.2* 31.8* 31.0*  MCV 91.1  --  91.2 91.4 89.1  PLT 280  --  264 284 262   Basic Metabolic Panel:  Recent Labs Lab 10/18/16 1321 10/19/16 0638 10/20/16 0541  10/21/16 0619  NA 140 142 143 136  K 3.9 4.3 4.1 3.5  CL 109 116* 115* 106  CO2 19* 14* 15* 23  GLUCOSE 88 58* 92 90  BUN 11 8 5* <5*  CREATININE 0.95 0.71 0.74 0.53  CALCIUM 8.1* 7.5* 7.7* 7.6*   GFR: Estimated Creatinine Clearance: 48.6 mL/min (by C-G formula based on SCr of 0.53 mg/dL). Liver Function Tests: No results for input(s): AST, ALT, ALKPHOS, BILITOT, PROT, ALBUMIN in the last 168 hours. No results for input(s): LIPASE, AMYLASE in the last 168 hours.  Recent Labs Lab 10/18/16 1631  AMMONIA 23   Coagulation Profile:  Recent Labs Lab 10/18/16 1321  INR 1.25   Cardiac Enzymes:  Recent Labs Lab 10/18/16 1321  TROPONINI 0.03*   BNP (last 3 results) No results for input(s): PROBNP in the last 8760 hours. HbA1C:  Recent Labs  10/18/16 1801  HGBA1C <4.2*   CBG:  Recent Labs Lab 10/20/16 1257 10/20/16 1724 10/20/16 2121 10/21/16 0821 10/21/16 1253  GLUCAP 132* 95 91 94 91   Lipid Profile: No results for input(s): CHOL, HDL, LDLCALC, TRIG, CHOLHDL, LDLDIRECT in the last 72 hours. Thyroid Function Tests:  Recent Labs  10/20/16 1353  TSH 1.692   Anemia Panel: No results for input(s): VITAMINB12, FOLATE, FERRITIN, TIBC, IRON, RETICCTPCT in the last 72 hours. Urine analysis:    Component Value Date/Time   COLORURINE YELLOW 10/18/2016 1415   APPEARANCEUR CLEAR 10/18/2016 1415   LABSPEC 1.028 10/18/2016 1415   PHURINE 5.0 10/18/2016 1415   GLUCOSEU NEGATIVE 10/18/2016 1415   HGBUR NEGATIVE 10/18/2016 1415   BILIRUBINUR NEGATIVE 10/18/2016 1415   KETONESUR 5 (A) 10/18/2016 1415   PROTEINUR NEGATIVE 10/18/2016 1415   UROBILINOGEN 1.0 02/28/2015 1227   NITRITE NEGATIVE 10/18/2016 1415   LEUKOCYTESUR NEGATIVE 10/18/2016 1415   Sepsis Labs: Invalid input(s): PROCALCITONIN, LACTICIDVEN  Recent Results (from the past 240 hour(s))  Urine culture     Status: None   Collection Time: 10/18/16  2:15 PM  Result Value Ref Range Status   Specimen  Description URINE, CATHETERIZED  Final   Special Requests NONE  Final   Culture NO GROWTH  Final   Report Status 10/19/2016 FINAL  Final  Gastrointestinal Panel by PCR , Stool     Status: None   Collection Time: 10/19/16  2:20 PM  Result Value Ref Range Status   Campylobacter species NOT DETECTED NOT DETECTED Final   Plesimonas shigelloides NOT DETECTED NOT DETECTED Final   Salmonella species NOT DETECTED NOT DETECTED Final   Yersinia enterocolitica NOT DETECTED NOT DETECTED Final   Vibrio species NOT DETECTED NOT DETECTED Final   Vibrio  cholerae NOT DETECTED NOT DETECTED Final   Enteroaggregative E coli (EAEC) NOT DETECTED NOT DETECTED Final   Enteropathogenic E coli (EPEC) NOT DETECTED NOT DETECTED Final   Enterotoxigenic E coli (ETEC) NOT DETECTED NOT DETECTED Final   Shiga like toxin producing E coli (STEC) NOT DETECTED NOT DETECTED Final   Shigella/Enteroinvasive E coli (EIEC) NOT DETECTED NOT DETECTED Final   Cryptosporidium NOT DETECTED NOT DETECTED Final   Cyclospora cayetanensis NOT DETECTED NOT DETECTED Final   Entamoeba histolytica NOT DETECTED NOT DETECTED Final   Giardia lamblia NOT DETECTED NOT DETECTED Final   Adenovirus F40/41 NOT DETECTED NOT DETECTED Final   Astrovirus NOT DETECTED NOT DETECTED Final   Norovirus GI/GII NOT DETECTED NOT DETECTED Final   Rotavirus A NOT DETECTED NOT DETECTED Final   Sapovirus (I, II, IV, and V) NOT DETECTED NOT DETECTED Final      Radiology Studies: No results found.   Scheduled Meds: . feeding supplement (ENSURE ENLIVE)  237 mL Oral TID BM  . hydrocortisone   Topical QID  . hydrocortisone   Rectal BID  . insulin aspart  0-9 Units Subcutaneous TID WC  . levothyroxine  25 mcg Oral QAC breakfast  . metoprolol tartrate  12.5 mg Oral BID  . multivitamin with minerals  1 tablet Oral Daily  . potassium chloride  10 mEq Oral Daily  . sodium chloride flush  3 mL Intravenous Q12H  . venlafaxine  25 mg Oral BID   Continuous  Infusions: . lactated ringers 75 mL/hr at 10/20/16 2036     Pamella Pert, MD, PhD [facility] Pager [phone]  If 7PM-7AM, please contact night-coverage [email or web address] Password [email or web address] 10/21/2016, 2:26 PM

## 2016-10-21 NOTE — Progress Notes (Signed)
PT Cancellation Note/Discharge  Patient Details Name: OCEANA CUSH MRN: [id number] DOB: 09/04/1961   Cancelled Treatment:    Reason Eval/Treat Not Completed: Patient declined, no reason specified.  This is pt's third day of refusal to participate with PT.  During her last admission she also refused to participate or get up out of the bed.  She does have an aide all day at home.  PT to sign off per departmental standard after three consecutive refusals.    Thanks,    Rollene Rotunda. Jarrett Albor, PT, DPT [id number]   10/21/2016, 10:45 AM

## 2016-10-21 NOTE — Evaluation (Signed)
Occupational Therapy Evaluation Patient Details Name: [name] MRN: [id number] DOB: 08/28/1961 Today's Date: 10/21/2016    History of Present Illness [name] is a 56 yo female admitted with nausea, vomiting, rectal pain, weakness--GI Bleed. Pt with PMHx: PE, DVT, Chron's disease.   Clinical Impression   This 56 yo female admitted with above presents to acute OT with deficits below (see OT problem list) thus potentially affecting her PLOF (but not totally sure due to pt's lack of wanting to participate with therapy despite increased time of coaxing and attempt at reasoning. Pt will continue to benefit with therapy if she will participate.     Follow Up Recommendations  Other (comment);Supervision/Assistance - 24 hour (At this point I would recommend SNF (due to we really cannot see how strong or weak pt is due to lack of participation))    Equipment Recommendations  Other (comment) (TBD next venue)       Precautions / Restrictions Precautions Precaution Comments: unknown since pt did not agree to get OOB Restrictions Weight Bearing Restrictions: No      Mobility Bed Mobility               General bed mobility comments: Pt able to go from her right side and shift herself around to fully supine upright sitting in bed and scoot herself up in bed  Transfers                 General transfer comment: Pt refused to sit up on EOB or get up OOB         ADL                                         General ADL Comments: Pt was able to brush teeth in bed with set up; however she would not even attempt to sit up on EOB much less do a transfer with me after many minutes of coaxing and explaining why it was important for her to move.               Pertinent Vitals/Pain Pain Assessment:  (pt asked for more pain meds, but did not state where her pain was)     Hand Dominance Left   Extremity/Trunk Assessment Upper Extremity Assessment Upper  Extremity Assessment: Overall WFL for tasks assessed           Communication Communication Communication: No difficulties   Cognition Arousal/Alertness: Awake/alert Behavior During Therapy: Flat affect                   General Comments: Could not reason with pt why it was important for her to get up--she kept saying she was cold (room temp all the way up on heat, offered to keep her covered while we sat up, offered to put a gown on the back of her, offered to get her warm blankets              Home Living Family/patient expects to be discharged to:: Private residence Living Arrangements: Alone Available Help at Discharge: Family;Personal care attendant;Available PRN/intermittently Type of Home: Apartment Home Access: Level entry     Home Layout: One level     Bathroom Shower/Tub: Other (comment) (sponge baths)   Bathroom Toilet: Standard Bathroom Accessibility: Yes   Home Equipment: Walker - 2 wheels;Cane - single point;Hospital bed;Tub bench;Wheelchair - [employer] - 4  wheels   Additional Comments: PCA 7 hrs weekdays and 2 hrs weekends      Prior Functioning/Environment Level of Independence: Needs assistance  Gait / Transfers Assistance Needed: pt states she walks with aides assist.  Pt doesn't like using the RW ADL's / Homemaking Assistance Needed: Pt reports she performs most of her ADLs. She reports she wears diapers at home, and has incontinent episodes.    Comments: Pt reports she uses SPC and furniture walking. All home set up this admission taken from prior chart due to pt would only report to me that she had a 4 RW and an aide (but would not say how many hours)        OT Problem List: Decreased activity tolerance (due to her report of being cold)   OT Treatment/Interventions: Self-care/ADL training;Therapeutic activities;DME and/or AE instruction;Patient/family education;Balance training    OT Goals(Current goals can be found in the care plan  section) Acute Rehab OT Goals Patient Stated Goal: to not get up, get stay warm, to go home and not to rehab anywhere Time For Goal Achievement: 10/28/16 Potential to Achieve Goals: Fair  OT Frequency: Min 2X/week   Barriers to D/C: Decreased caregiver support             End of Session Nurse Communication: Patient requests pain meds;Other (comment) (pt did very little with therapy)  Activity Tolerance: Other (comment) (self-limiting due to "I'm cold") Patient left: in bed;with call bell/phone within reach;with bed alarm set   Time: 0930-1000 OT Time Calculation (min): 30 min Charges:  OT General Charges $OT Visit: 1 Procedure OT Evaluation $OT Eval Low Complexity: 1 Procedure  Evette Georges [phone] 10/21/2016, 11:00 AM

## 2016-10-21 NOTE — Progress Notes (Signed)
OT Cancellation Note  Patient Details Name: [name] MRN: [id number] DOB: [birth year]   Cancelled Treatment:    Reason Eval/Treat Not Completed: Other (comment). Pt in bed with covers over her head, came out from under them when I spoke to her, but then went back under them the rest of the time I was trying to talk to her. Pt asking for pain meds before she gets up (spoke with RN who said it was time for them and he would get them for her). Pt also said she could not get up because she was cold (heat all the way up and I offered to get her a gown for her back side upon sitting up on side of bed and warm blankets when we finished). Pt also reported. " I need to rest". When asked if she had been out of bed since she got here she said "no". Will try back at a later time as schedule allows for evaluation.  Evette Georges [phone] 10/21/2016, 8:19 AM

## 2016-10-22 DIAGNOSIS — L89152 Pressure ulcer of sacral region, stage 2: Secondary | ICD-10-CM

## 2016-10-22 DIAGNOSIS — E43 Unspecified severe protein-calorie malnutrition: Secondary | ICD-10-CM

## 2016-10-22 DIAGNOSIS — E860 Dehydration: Secondary | ICD-10-CM

## 2016-10-22 DIAGNOSIS — K625 Hemorrhage of anus and rectum: Principal | ICD-10-CM

## 2016-10-22 DIAGNOSIS — K649 Unspecified hemorrhoids: Secondary | ICD-10-CM | POA: Diagnosis present

## 2016-10-22 LAB — BASIC METABOLIC PANEL
Anion gap: 9 (ref 5–15)
BUN: <5 mg/dL — ABNORMAL LOW (ref 6–20)
CHLORIDE: 109 mmol/L (ref 101–111)
CO2: 21 mmol/L — ABNORMAL LOW (ref 22–32)
CREATININE: 0.59 mg/dL (ref 0.44–1.00)
Calcium: 7.7 mg/dL — ABNORMAL LOW (ref 8.9–10.3)
Glucose, Bld: 116 mg/dL — ABNORMAL HIGH (ref 65–99)
Potassium: 3.8 mmol/L (ref 3.5–5.1)
SODIUM: 139 mmol/L (ref 135–145)

## 2016-10-22 LAB — CBC
HCT: 29.7 % — ABNORMAL LOW (ref 36.0–46.0)
HEMOGLOBIN: 10.3 g/dL — AB (ref 12.0–15.0)
MCH: 30.5 pg (ref 26.0–34.0)
MCHC: 34.7 g/dL (ref 30.0–36.0)
MCV: 87.9 fL (ref 78.0–100.0)
PLATELETS: 173 10*3/uL (ref 150–400)
RBC: 3.38 MIL/uL — AB (ref 3.87–5.11)
RDW: 18.3 % — ABNORMAL HIGH (ref 11.5–15.5)
WBC: 8.1 10*3/uL (ref 4.0–10.5)

## 2016-10-22 LAB — GLUCOSE, CAPILLARY
GLUCOSE-CAPILLARY: 143 mg/dL — AB (ref 65–99)
GLUCOSE-CAPILLARY: 103 mg/dL — AB (ref 65–99)
Glucose-Capillary: 120 mg/dL — ABNORMAL HIGH (ref 65–99)

## 2016-10-22 LAB — VITAMIN B12: Vitamin B-12: 765 pg/mL (ref 180–914)

## 2016-10-22 MED ORDER — SACCHAROMYCES BOULARDII 250 MG PO CAPS: 250.0000 mg | ORAL_CAPSULE | Freq: Two times a day (BID) | ORAL | 0 refills | Status: DC

## 2016-10-22 MED ORDER — ENSURE ENLIVE PO LIQD: 237.0000 mL | Freq: Three times a day (TID) | ORAL | 12 refills | Status: DC

## 2016-10-22 MED ORDER — HYDROCORTISONE 2.5 % RE CREA: TOPICAL_CREAM | Freq: Two times a day (BID) | RECTAL | 1 refills | Status: DC

## 2016-10-22 MED ORDER — HYDROCORTISONE 2.5 % RE CREA: TOPICAL_CREAM | Freq: Four times a day (QID) | RECTAL | 1 refills | Status: AC

## 2016-10-22 NOTE — Progress Notes (Signed)
Subjective: Some hemorrhoidal discomfort. Much less diarrhea (none today, some yesterday). No blood in stool. No abdominal pain.  Objective: Vital signs in last 24 hours: Temp:  [98.8 F (37.1 C)-100 F (37.8 C)] 98.8 F (37.1 C) (01/22 0448) Pulse Rate:  [55-122] 85 (01/22 0448) Resp:  [17-18] 18 (01/22 0448) BP: (103-115)/(71) 115/71 (01/22 0448) SpO2:  [92 %-100 %] 100 % (01/22 0448) Weight change:  Last BM Date: 10/21/16  PE: GEN: NAD, older-appearing than stated age ABD:  Soft, protuberant, non-tender  Lab Results: CBC    Component Value Date/Time   WBC 8.1 10/22/2016 0928   RBC 3.38 (L) 10/22/2016 0928   HGB 10.3 (L) 10/22/2016 0928   HCT 29.7 (L) 10/22/2016 0928   HCT 33.8 (L) 08/11/2016 2350   PLT 173 10/22/2016 0928   MCV 87.9 10/22/2016 0928   MCH 30.5 10/22/2016 0928   MCHC 34.7 10/22/2016 0928   RDW 18.3 (H) 10/22/2016 0928   LYMPHSABS 1.7 10/18/2016 1321   MONOABS 0.3 10/18/2016 1321   EOSABS 0.0 10/18/2016 1321   BASOSABS 0.0 10/18/2016 1321   CMP     Component Value Date/Time   NA 139 10/22/2016 0928   K 3.8 10/22/2016 0928   CL 109 10/22/2016 0928   CO2 21 (L) 10/22/2016 0928   GLUCOSE 116 (H) 10/22/2016 0928   BUN <5 (L) 10/22/2016 0928   CREATININE 0.59 10/22/2016 0928   CALCIUM 7.7 (L) 10/22/2016 0928   PROT 3.6 (L) 08/13/2016 0440   ALBUMIN 1.5 (L) 08/13/2016 0440   AST 34 08/13/2016 0440   ALT 13 (L) 08/13/2016 0440   ALKPHOS 48 08/13/2016 0440   BILITOT 0.8 08/13/2016 0440   GFRNONAA >60 10/22/2016 0928   GFRAA >60 10/22/2016 0928   Assessment:  1.  Diarrhea.  Improving.  Negative GI pathogen panel. 2.  Acute on chronic anemia. 3.  Hemoccult-positive stools without overt bleeding at the present time. 4.  Hemorrhoids. 5.  Questionable history of Crohn's disease, colonoscopy June 2016 showed no evidence of enteritis or colitis (and mucosal biopsies showed no chronic architectural changes to suggest chronic inflammatory bowel  disease).  Plan:  1.  OK to discharge home now from GI perspective. 2.  Anusol-HC prn for hemorrhoids. 3.  Probiotics (e.g., Florastor 250 mg po bid) for bowel regulation. 4.  Outpatient follow-up with [facility] will be arranged. 5.  [facility] will sign-off; please call with questions; thank you for the consultation.  I have discussed case with Dr. Gonzella Lex of [facility].   Freddy Jaksch 10/22/2016, 11:10 AM   Pager [phone] If no answer or after 5 PM call [phone]

## 2016-10-22 NOTE — Care Management Note (Addendum)
Case Management Note  Patient Details  Name: [name] MRN: [id number] Date of Birth: June 27, 1961  Subjective/Objective:                 Patient admitted from home, lives with son. No CM need identified at this time. Per patient request will DC with [facility]. Packet placed in front of chart, RN provided with instructions to call when ready for DC.    Action/Plan:  DC to home. Patient discharged. CM attempt to call patient to set up HH, unable to leave VM.   Expected Discharge Date:                  Expected Discharge Plan:  Home/Self Care  In-House Referral:     Discharge planning Services  CM Consult  Post Acute Care Choice:    Choice offered to:     DME Arranged:    DME Agency:     HH Arranged:    HH Agency:     Status of Service:  Completed, signed off  If discussed at [employer] of Stay Meetings, dates discussed:    Additional Comments:  Lawerance Sabal, RN 10/22/2016, 11:51 AM

## 2016-10-22 NOTE — Discharge Summary (Signed)
Physician Discharge Summary  [name]:[id number] DOB: 06-13-1961 DOA: 10/18/2016  PCP: [name] Housekeeper, MD  Admit date: 10/18/2016 Discharge date: 10/22/2016  Admitted From: Home Disposition:  Home  Recommendations for Outpatient Follow-up:  1. Follow up with PCP in 1-2 weeks. Monitor H&H during follow-up. 2. [facility] will arrange outpatient follow-up.  Home Health: [facility] Equipment/Devices: Has a walker  Discharge Condition: Fair CODE STATUS: Full code Diet recommendation:  Regular with supplement   Discharge Diagnoses:  Principal Problem:   GIB (gastrointestinal bleeding)   Active Problems:   Bleeding hemorrhoid   Diabetes mellitus with complication (HCC)   Depression   Peripheral neuropathy (HCC)   HTN (hypertension)   Anemia   Dehydration   Severe protein-calorie malnutrition (HCC)   Hypothyroidism   HLD (hyperlipidemia)   Protein calorie malnutrition (HCC)   Sacral decubitus ulcer, stage II   Brief narrative/history of present illness 56 y.o.femalewith PMH of DM withdiabetic neuropathies, HTN, HPL, hypothyroidism, paroxysmal atrial fibrillation, DVT in June 2017 on apixaban, GERD, anemia, urosepsis, Chrondisease with diarrhea who presentedto the emergency department with complaints several day history of rectal pain after defecation, nausea and vomiting and reported rectal bleeding. Stool for occult blood was positive in the ED. Admitted to hospitalist service and [facility] consulted.  Hospital course Rectal bleeding -  Secondary to bleeding external hemorrhoids.. She had Colonoscopy and EGD 1-2 years ago which were essentially normal.  Placed on hydrocortisone suppository twice a day and external cream. eliquis held but since H&H has remained stable will resume upon discharge. GI pathogen panel negative. Appreciate GI evaluation. Recommend to continue hydrocortisone suppository and cream and added florastor for regular bowel movement. Patient is also on  narcotic at home for chronic pain and I have instructed her to eat minimize them to avoid constipation. Encouraged high-fiber diet and hydration to prevent constipation.  GI will arrange outpatient follow-up.  Nausea, vomiting, dehydration, lactic acidosis -  Possibly secondary to dehydration. No signs of infection. Resolved with IV fluids and antiemetics. Low bicarbonate replenished.  History of PE and DVT  - in April 2017, on Eliquis. Was held due to rectal bleed. Since bleeding is now stable and okay to resume diet regulation per GI.  ? Hx of DM A1c of 4.2. Not on any medications.  Hypothyroidism - continue levothyroxine.   Hypotension  - most likely associated with nausea and diarrhea induced metabolic acidosis and dehydration, improved with fluids.   Severe protein calorie malnutrition  - nutrition consulted. Added supplement  Sacral ulcer stage II No signs of infection. Recommend Home health nursing.  Family Communication: no family at bedside Disposition Plan:  home with home health  Consultants:   [facility]  Procedures:   None  Antimicrobials:  None    PT recommended home health with 24-hour supervision versus SNF. Patient refused SNF and wanted to go home. Reports having home health RN and PT which we will reinstated.  Discharge Instructions   Allergies as of 10/22/2016      Reactions   Iohexol Other (See Comments)   "severe burning" Patient has received Contrast in 2005 with 13 hour pre-medication, and had no reaction at that time   Spiriva Handihaler [tiotropium Bromide Monohydrate] Nausea And Vomiting   Iodinated Diagnostic Agents Other (See Comments)   Feels like she is "on fire" for months afterwards, if exposed   Ativan [lorazepam] Other (See Comments)   Hallucinations/confusion/"swatting flies"   Penicillins Hives   Has had cephalosporins Has patient had  a PCN reaction causing immediate rash, facial/tongue/throat swelling, SOB or  lightheadedness with hypotension: Yes Has patient had a PCN reaction causing severe rash involving mucus membranes or skin necrosis: Yes Has patient had a PCN reaction that required hospitalization: Yes Has patient had a PCN reaction occurring within the last 10 years: No If all of the above answers are "NO", then may proceed with Cephalosporin use.      Medication List    STOP taking these medications   diphenoxylate-atropine 2.5-0.025 MG tablet Commonly known as:  LOMOTIL     TAKE these medications   apixaban 5 MG Tabs tablet Commonly known as:  ELIQUIS Take 1 tablet (5 mg total) by mouth 2 (two) times daily.   calcium carbonate 1250 (500 Ca) MG tablet Commonly known as:  OS-CAL - dosed in mg of elemental calcium Take 1 tablet (500 mg of elemental calcium total) by mouth daily with breakfast.   feeding supplement (ENSURE ENLIVE) Liqd Take 237 mLs by mouth 3 (three) times daily between meals. What changed:  how much to take   hydrocortisone 2.5 % rectal cream Commonly known as:  ANUSOL-HC Apply topically 4 (four) times daily.   hydrocortisone 2.5 % rectal cream Commonly known as:  ANUSOL-HC Place rectally 2 (two) times daily.   HYDROmorphone 4 MG tablet Commonly known as:  DILAUDID Take 8 mg by mouth every 4 (four) hours as needed for severe pain.   levothyroxine 25 MCG tablet Commonly known as:  SYNTHROID, LEVOTHROID Take 1 tablet (25 mcg total) by mouth daily before breakfast.   magnesium oxide 400 MG tablet Commonly known as:  MAG-OX Take 1 tablet (400 mg total) by mouth 2 (two) times daily.   potassium chloride 10 MEQ CR capsule Commonly known as:  MICRO-K Take 20 mEq by mouth 2 (two) times daily.   saccharomyces boulardii 250 MG capsule Commonly known as:  FLORASTOR Take 1 capsule (250 mg total) by mouth 2 (two) times daily.   thiamine 100 MG tablet Commonly known as:  VITAMIN B-1 Take 1 tablet (100 mg total) by mouth daily.   traZODone 25 mg Tabs  tablet Commonly known as:  DESYREL Take 0.5 tablets (25 mg total) by mouth at bedtime as needed for sleep.   venlafaxine 25 MG tablet Commonly known as:  EFFEXOR Take 1 tablet (25 mg total) by mouth 2 (two) times daily.      Follow-up Information    HUSAIN,KARRAR, MD Follow up in 1 week(s).   Specialty:  Internal Medicine Contact information: [address] [place] [address] [phone]        [facility] gastroenterology Follow up in 4 week(s).   Why:  Office will call         Allergies  Allergen Reactions  . Iohexol Other (See Comments)    "severe burning" Patient has received Contrast in 2005 with 13 hour pre-medication, and had no reaction at that time  . Spiriva Handihaler [Tiotropium Bromide Monohydrate] Nausea And Vomiting  . Iodinated Diagnostic Agents Other (See Comments)    Feels like she is "on fire" for months afterwards, if exposed  . Ativan [Lorazepam] Other (See Comments)    Hallucinations/confusion/"swatting flies"  . Penicillins Hives    Has had cephalosporins Has patient had a PCN reaction causing immediate rash, facial/tongue/throat swelling, SOB or lightheadedness with hypotension: Yes Has patient had a PCN reaction causing severe rash involving mucus membranes or skin necrosis: Yes Has patient had a PCN reaction that required  hospitalization: Yes Has patient had a PCN reaction occurring within the last 10 years: No If all of the above answers are "NO", then may proceed with Cephalosporin use.        Subjective: No further rectal bleed. Having regular bowel movements.  Discharge Exam: Vitals:   10/21/16 2100 10/22/16 0448  BP: 105/71 115/71  Pulse: (!) 122 85  Resp: 18 18  Temp: 100 F (37.8 C) 98.8 F (37.1 C)   Vitals:   10/21/16 1100 10/21/16 1637 10/21/16 2100 10/22/16 0448  BP: 115/69 103/71 105/71 115/71  Pulse: 89 (!) 55 (!) 122 85  Resp:  17 18 18   Temp:  99 F (37.2 C) 100 F (37.8 C) 98.8 F (37.1 C)   TempSrc:  Oral Oral Oral  SpO2:  97% 92% 100%  Weight:      Height:        General: Middle aged thin built female not in distress HEENT: Pallor present, temporal wasting, moist mucosa, supple neck Chest: Clear bilaterally CVS: Normal S1 and S2, no murmurs GI: Soft, nondistended, nontender, external hemorrhoids without bleeding Musculoskeletal: Warm, no edema    The results of significant diagnostics from this hospitalization (including imaging, microbiology, ancillary and laboratory) are listed below for reference.     Microbiology: Recent Results (from the past 240 hour(s))  Urine culture     Status: None   Collection Time: 10/18/16  2:15 PM  Result Value Ref Range Status   Specimen Description URINE, CATHETERIZED  Final   Special Requests NONE  Final   Culture NO GROWTH  Final   Report Status 10/19/2016 FINAL  Final  Gastrointestinal Panel by PCR , Stool     Status: None   Collection Time: 10/19/16  2:20 PM  Result Value Ref Range Status   Campylobacter species NOT DETECTED NOT DETECTED Final   Plesimonas shigelloides NOT DETECTED NOT DETECTED Final   Salmonella species NOT DETECTED NOT DETECTED Final   Yersinia enterocolitica NOT DETECTED NOT DETECTED Final   Vibrio species NOT DETECTED NOT DETECTED Final   Vibrio cholerae NOT DETECTED NOT DETECTED Final   Enteroaggregative E coli (EAEC) NOT DETECTED NOT DETECTED Final   Enteropathogenic E coli (EPEC) NOT DETECTED NOT DETECTED Final   Enterotoxigenic E coli (ETEC) NOT DETECTED NOT DETECTED Final   Shiga like toxin producing E coli (STEC) NOT DETECTED NOT DETECTED Final   Shigella/Enteroinvasive E coli (EIEC) NOT DETECTED NOT DETECTED Final   Cryptosporidium NOT DETECTED NOT DETECTED Final   Cyclospora cayetanensis NOT DETECTED NOT DETECTED Final   Entamoeba histolytica NOT DETECTED NOT DETECTED Final   Giardia lamblia NOT DETECTED NOT DETECTED Final   Adenovirus F40/41 NOT DETECTED NOT DETECTED Final   Astrovirus  NOT DETECTED NOT DETECTED Final   Norovirus GI/GII NOT DETECTED NOT DETECTED Final   Rotavirus A NOT DETECTED NOT DETECTED Final   Sapovirus (I, II, IV, and V) NOT DETECTED NOT DETECTED Final     Labs: BNP (last 3 results) No results for input(s): BNP in the last 8760 hours. Basic Metabolic Panel:  Recent Labs Lab 10/18/16 1321 10/19/16 0638 10/20/16 0541 10/21/16 0619 10/22/16 0928  NA 140 142 143 136 139  K 3.9 4.3 4.1 3.5 3.8  CL 109 116* 115* 106 109  CO2 19* 14* 15* 23 21*  GLUCOSE 88 58* 92 90 116*  BUN 11 8 5* <5* <5*  CREATININE 0.95 0.71 0.74 0.53 0.59  CALCIUM 8.1* 7.5* 7.7* 7.6* 7.7*   Liver Function  Tests: No results for input(s): AST, ALT, ALKPHOS, BILITOT, PROT, ALBUMIN in the last 168 hours. No results for input(s): LIPASE, AMYLASE in the last 168 hours.  Recent Labs Lab 10/18/16 1631  AMMONIA 23   CBC:  Recent Labs Lab 10/18/16 1321 10/19/16 0112 10/19/16 1610 10/20/16 0541 10/21/16 0619 10/22/16 0928  WBC 5.3  --  5.9 7.1 6.6 8.1  NEUTROABS 3.2  --   --   --   --   --   HGB 11.5* 9.8* 10.7* 10.5* 10.6* 10.3*  HCT 34.8* 28.5* 32.2* 31.8* 31.0* 29.7*  MCV 91.1  --  91.2 91.4 89.1 87.9  PLT 280  --  264 284 262 173   Cardiac Enzymes:  Recent Labs Lab 10/18/16 1321  TROPONINI 0.03*   BNP: Invalid input(s): POCBNP CBG:  Recent Labs Lab 10/21/16 1253 10/21/16 1641 10/21/16 2057 10/22/16 0811 10/22/16 1245  GLUCAP 91 143* 111* 103* 120*   D-Dimer No results for input(s): DDIMER in the last 72 hours. Hgb A1c No results for input(s): HGBA1C in the last 72 hours. Lipid Profile No results for input(s): CHOL, HDL, LDLCALC, TRIG, CHOLHDL, LDLDIRECT in the last 72 hours. Thyroid function studies  Recent Labs  10/20/16 1353  TSH 1.692   Anemia work up  Recent Labs  10/22/16 0928  VITAMINB12 765   Urinalysis    Component Value Date/Time   COLORURINE YELLOW 10/18/2016 1415   APPEARANCEUR CLEAR 10/18/2016 1415   LABSPEC  1.028 10/18/2016 1415   PHURINE 5.0 10/18/2016 1415   GLUCOSEU NEGATIVE 10/18/2016 1415   HGBUR NEGATIVE 10/18/2016 1415   BILIRUBINUR NEGATIVE 10/18/2016 1415   KETONESUR 5 (A) 10/18/2016 1415   PROTEINUR NEGATIVE 10/18/2016 1415   UROBILINOGEN 1.0 02/28/2015 1227   NITRITE NEGATIVE 10/18/2016 1415   LEUKOCYTESUR NEGATIVE 10/18/2016 1415   Sepsis Labs Invalid input(s): PROCALCITONIN,  WBC,  LACTICIDVEN Microbiology Recent Results (from the past 240 hour(s))  Urine culture     Status: None   Collection Time: 10/18/16  2:15 PM  Result Value Ref Range Status   Specimen Description URINE, CATHETERIZED  Final   Special Requests NONE  Final   Culture NO GROWTH  Final   Report Status 10/19/2016 FINAL  Final  Gastrointestinal Panel by PCR , Stool     Status: None   Collection Time: 10/19/16  2:20 PM  Result Value Ref Range Status   Campylobacter species NOT DETECTED NOT DETECTED Final   Plesimonas shigelloides NOT DETECTED NOT DETECTED Final   Salmonella species NOT DETECTED NOT DETECTED Final   Yersinia enterocolitica NOT DETECTED NOT DETECTED Final   Vibrio species NOT DETECTED NOT DETECTED Final   Vibrio cholerae NOT DETECTED NOT DETECTED Final   Enteroaggregative E coli (EAEC) NOT DETECTED NOT DETECTED Final   Enteropathogenic E coli (EPEC) NOT DETECTED NOT DETECTED Final   Enterotoxigenic E coli (ETEC) NOT DETECTED NOT DETECTED Final   Shiga like toxin producing E coli (STEC) NOT DETECTED NOT DETECTED Final   Shigella/Enteroinvasive E coli (EIEC) NOT DETECTED NOT DETECTED Final   Cryptosporidium NOT DETECTED NOT DETECTED Final   Cyclospora cayetanensis NOT DETECTED NOT DETECTED Final   Entamoeba histolytica NOT DETECTED NOT DETECTED Final   Giardia lamblia NOT DETECTED NOT DETECTED Final   Adenovirus F40/41 NOT DETECTED NOT DETECTED Final   Astrovirus NOT DETECTED NOT DETECTED Final   Norovirus GI/GII NOT DETECTED NOT DETECTED Final   Rotavirus A NOT DETECTED NOT DETECTED  Final   Sapovirus (I, II, IV, and  V) NOT DETECTED NOT DETECTED Final     Time coordinating discharge: Over 30 minutes  SIGNED:   Eddie North, MD  [facility] 10/22/2016, 1:35 PM Pager   If 7PM-7AM, please contact night-coverage [email or web address] Password [email or web address]

## 2016-10-22 NOTE — Care Management Important Message (Signed)
Important Message  Patient Details  Name: [name] MRN: [id number] Date of Birth: 11-04-1960   Medicare Important Message Given:  Yes    Kyla Balzarine 10/22/2016, 1:55 PM

## 2016-10-22 NOTE — Discharge Instructions (Signed)
Hemorrhoids Hemorrhoids are swollen veins in and around the rectum or anus. There are two types of hemorrhoids:  Internal hemorrhoids. These occur in the veins that are just inside the rectum. They may poke through to the outside and become irritated and painful.  External hemorrhoids. These occur in the veins that are outside of the anus and can be felt as a painful swelling or hard lump near the anus.  Most hemorrhoids do not cause serious problems, and they can be managed with home treatments such as diet and lifestyle changes. If home treatments do not help your symptoms, procedures can be done to shrink or remove the hemorrhoids. What are the causes? This condition is caused by increased pressure in the anal area. This pressure may result from various things, including:  Constipation.  Straining to have a bowel movement.  Diarrhea.  Pregnancy.  Obesity.  Sitting for long periods of time.  Heavy lifting or other activity that causes you to strain.  Anal sex.  What are the signs or symptoms? Symptoms of this condition include:  Pain.  Anal itching or irritation.  Rectal bleeding.  Leakage of stool (feces).  Anal swelling.  One or more lumps around the anus.  How is this diagnosed? This condition can often be diagnosed through a visual exam. Other exams or tests may also be done, such as:  Examination of the rectal area with a gloved hand (digital rectal exam).  Examination of the anal canal using a small tube (anoscope).  A blood test, if you have lost a significant amount of blood.  A test to look inside the colon (sigmoidoscopy or colonoscopy).  How is this treated? This condition can usually be treated at home. However, various procedures may be done if dietary changes, lifestyle changes, and other home treatments do not help your symptoms. These procedures can help make the hemorrhoids smaller or remove them completely. Some of these procedures involve  surgery, and others do not. Common procedures include:  Rubber band ligation. Rubber bands are placed at the base of the hemorrhoids to cut off the blood supply to them.  Sclerotherapy. Medicine is injected into the hemorrhoids to shrink them.  Infrared coagulation. A type of light energy is used to get rid of the hemorrhoids.  Hemorrhoidectomy surgery. The hemorrhoids are surgically removed, and the veins that supply them are tied off.  Stapled hemorrhoidopexy surgery. A circular stapling device is used to remove the hemorrhoids and use staples to cut off the blood supply to them.  Follow these instructions at home: Eating and drinking  Eat foods that have a lot of fiber in them, such as whole grains, beans, nuts, fruits, and vegetables. Ask your health care provider about taking products that have added fiber (fiber supplements).  Drink enough fluid to keep your urine clear or pale yellow. Managing pain and swelling  Take warm sitz baths for 20 minutes, 3-4 times a day to ease pain and discomfort.  If directed, apply ice to the affected area. Using ice packs between sitz baths may be helpful. ? Put ice in a plastic bag. ? Place a towel between your skin and the bag. ? Leave the ice on for 20 minutes, 2-3 times a day. General instructions  Take over-the-counter and prescription medicines only as told by your health care provider.  Use medicated creams or suppositories as told.  Exercise regularly.  Go to the bathroom when you have the urge to have a bowel movement. Do not wait.    Avoid straining to have bowel movements.  Keep the anal area dry and clean. Use wet toilet paper or moist towelettes after a bowel movement.  Do not sit on the toilet for long periods of time. This increases blood pooling and pain. Contact a health care provider if:  You have increasing pain and swelling that are not controlled by treatment or medicine.  You have uncontrolled bleeding.  You  have difficulty having a bowel movement, or you are unable to have a bowel movement.  You have pain or inflammation outside the area of the hemorrhoids. This information is not intended to replace advice given to you by your health care provider. Make sure you discuss any questions you have with your health care provider. Document Released: 09/14/2000 Document Revised: 02/15/2016 Document Reviewed: 06/01/2015 Elsevier Interactive Patient Education  2017 [employer]  

## 2016-10-22 NOTE — Progress Notes (Signed)
Pt lost her IV. Replacement of IV was attempted and unsuccessful. She refused a second attempt in between the placement. NP Schorr was notified. No new orders. Will continue to monitor.

## 2016-10-22 NOTE — Consult Note (Signed)
   [facility] CM Inpatient Consult   10/22/2016  [name] [birth year] [id number]    Went to bedside to speak with patient about [facility] Care Management program services. However, she was already discharged.  Raiford Noble, MSN-Ed, RN,BSN [facility] Liaison [phone]

## 2016-10-30 DIAGNOSIS — I82492 Acute embolism and thrombosis of other specified deep vein of left lower extremity: Secondary | ICD-10-CM | POA: Diagnosis not present

## 2016-10-30 DIAGNOSIS — I82432 Acute embolism and thrombosis of left popliteal vein: Secondary | ICD-10-CM | POA: Diagnosis not present

## 2016-10-30 DIAGNOSIS — I82442 Acute embolism and thrombosis of left tibial vein: Secondary | ICD-10-CM | POA: Diagnosis not present

## 2016-10-30 DIAGNOSIS — I82412 Acute embolism and thrombosis of left femoral vein: Secondary | ICD-10-CM | POA: Diagnosis not present

## 2016-10-30 DIAGNOSIS — E1140 Type 2 diabetes mellitus with diabetic neuropathy, unspecified: Secondary | ICD-10-CM | POA: Diagnosis not present

## 2016-10-30 DIAGNOSIS — L89312 Pressure ulcer of right buttock, stage 2: Secondary | ICD-10-CM | POA: Diagnosis not present

## 2016-11-01 DIAGNOSIS — E1140 Type 2 diabetes mellitus with diabetic neuropathy, unspecified: Secondary | ICD-10-CM | POA: Diagnosis not present

## 2016-11-01 DIAGNOSIS — L89312 Pressure ulcer of right buttock, stage 2: Secondary | ICD-10-CM | POA: Diagnosis not present

## 2016-11-01 DIAGNOSIS — I82432 Acute embolism and thrombosis of left popliteal vein: Secondary | ICD-10-CM | POA: Diagnosis not present

## 2016-11-01 DIAGNOSIS — I82442 Acute embolism and thrombosis of left tibial vein: Secondary | ICD-10-CM | POA: Diagnosis not present

## 2016-11-01 DIAGNOSIS — I82412 Acute embolism and thrombosis of left femoral vein: Secondary | ICD-10-CM | POA: Diagnosis not present

## 2016-11-01 DIAGNOSIS — I82492 Acute embolism and thrombosis of other specified deep vein of left lower extremity: Secondary | ICD-10-CM | POA: Diagnosis not present

## 2016-11-06 ENCOUNTER — Observation Stay (HOSPITAL_COMMUNITY)
Admission: EM | Admit: 2016-11-06 | Discharge: 2016-11-07 | Disposition: A | Discharge status: Home / Self Care | Payer: Medicare Other | Attending: Internal Medicine | Admitting: Internal Medicine

## 2016-11-06 ENCOUNTER — Emergency Department (HOSPITAL_COMMUNITY): Payer: Medicare Other

## 2016-11-06 ENCOUNTER — Encounter (HOSPITAL_COMMUNITY): Payer: Self-pay | Admitting: Emergency Medicine

## 2016-11-06 DIAGNOSIS — E11649 Type 2 diabetes mellitus with hypoglycemia without coma: Principal | ICD-10-CM | POA: Insufficient documentation

## 2016-11-06 DIAGNOSIS — F17210 Nicotine dependence, cigarettes, uncomplicated: Secondary | ICD-10-CM | POA: Diagnosis not present

## 2016-11-06 DIAGNOSIS — G894 Chronic pain syndrome: Secondary | ICD-10-CM | POA: Diagnosis not present

## 2016-11-06 DIAGNOSIS — Z86718 Personal history of other venous thrombosis and embolism: Secondary | ICD-10-CM | POA: Insufficient documentation

## 2016-11-06 DIAGNOSIS — E162 Hypoglycemia, unspecified: Secondary | ICD-10-CM | POA: Diagnosis not present

## 2016-11-06 DIAGNOSIS — E039 Hypothyroidism, unspecified: Secondary | ICD-10-CM | POA: Diagnosis not present

## 2016-11-06 DIAGNOSIS — Z79899 Other long term (current) drug therapy: Secondary | ICD-10-CM | POA: Insufficient documentation

## 2016-11-06 DIAGNOSIS — E161 Other hypoglycemia: Secondary | ICD-10-CM | POA: Diagnosis not present

## 2016-11-06 DIAGNOSIS — I871 Compression of vein: Secondary | ICD-10-CM | POA: Diagnosis not present

## 2016-11-06 DIAGNOSIS — R42 Dizziness and giddiness: Secondary | ICD-10-CM | POA: Diagnosis not present

## 2016-11-06 DIAGNOSIS — I82412 Acute embolism and thrombosis of left femoral vein: Secondary | ICD-10-CM | POA: Diagnosis not present

## 2016-11-06 DIAGNOSIS — G8929 Other chronic pain: Secondary | ICD-10-CM | POA: Diagnosis present

## 2016-11-06 DIAGNOSIS — F329 Major depressive disorder, single episode, unspecified: Secondary | ICD-10-CM | POA: Diagnosis present

## 2016-11-06 DIAGNOSIS — I82492 Acute embolism and thrombosis of other specified deep vein of left lower extremity: Secondary | ICD-10-CM | POA: Diagnosis not present

## 2016-11-06 DIAGNOSIS — E1140 Type 2 diabetes mellitus with diabetic neuropathy, unspecified: Secondary | ICD-10-CM | POA: Diagnosis not present

## 2016-11-06 DIAGNOSIS — R740 Nonspecific elevation of levels of transaminase and lactic acid dehydrogenase [LDH]: Secondary | ICD-10-CM | POA: Insufficient documentation

## 2016-11-06 DIAGNOSIS — R7989 Other specified abnormal findings of blood chemistry: Secondary | ICD-10-CM | POA: Diagnosis not present

## 2016-11-06 DIAGNOSIS — I82442 Acute embolism and thrombosis of left tibial vein: Secondary | ICD-10-CM | POA: Diagnosis not present

## 2016-11-06 DIAGNOSIS — L89312 Pressure ulcer of right buttock, stage 2: Secondary | ICD-10-CM | POA: Diagnosis not present

## 2016-11-06 DIAGNOSIS — R748 Abnormal levels of other serum enzymes: Secondary | ICD-10-CM

## 2016-11-06 DIAGNOSIS — F32A Depression, unspecified: Secondary | ICD-10-CM | POA: Diagnosis present

## 2016-11-06 DIAGNOSIS — R5383 Other fatigue: Secondary | ICD-10-CM | POA: Diagnosis not present

## 2016-11-06 DIAGNOSIS — Z7901 Long term (current) use of anticoagulants: Secondary | ICD-10-CM | POA: Insufficient documentation

## 2016-11-06 DIAGNOSIS — I10 Essential (primary) hypertension: Secondary | ICD-10-CM | POA: Diagnosis not present

## 2016-11-06 DIAGNOSIS — K649 Unspecified hemorrhoids: Secondary | ICD-10-CM | POA: Diagnosis present

## 2016-11-06 DIAGNOSIS — R7401 Elevation of levels of liver transaminase levels: Secondary | ICD-10-CM | POA: Diagnosis present

## 2016-11-06 DIAGNOSIS — R945 Abnormal results of liver function studies: Secondary | ICD-10-CM | POA: Diagnosis not present

## 2016-11-06 DIAGNOSIS — I82432 Acute embolism and thrombosis of left popliteal vein: Secondary | ICD-10-CM | POA: Diagnosis not present

## 2016-11-06 LAB — COMPREHENSIVE METABOLIC PANEL
ALBUMIN: 2 g/dL — AB (ref 3.5–5.0)
ALK PHOS: 77 U/L (ref 38–126)
ALT: 81 U/L — ABNORMAL HIGH (ref 14–54)
ANION GAP: 12 (ref 5–15)
AST: 205 U/L — ABNORMAL HIGH (ref 15–41)
BUN: 7 mg/dL (ref 6–20)
CALCIUM: 7.6 mg/dL — AB (ref 8.9–10.3)
CO2: 19 mmol/L — ABNORMAL LOW (ref 22–32)
Chloride: 113 mmol/L — ABNORMAL HIGH (ref 101–111)
Creatinine, Ser: 0.87 mg/dL (ref 0.44–1.00)
GFR calc Af Amer: >60 mL/min (ref >60)
GLUCOSE: 58 mg/dL — AB (ref 65–99)
POTASSIUM: 4.1 mmol/L (ref 3.5–5.1)
Sodium: 144 mmol/L (ref 135–145)
TOTAL PROTEIN: 4.6 g/dL — AB (ref 6.5–8.1)
Total Bilirubin: 1.6 mg/dL — ABNORMAL HIGH (ref 0.3–1.2)

## 2016-11-06 LAB — I-STAT CG4 LACTIC ACID, ED
LACTIC ACID, VENOUS: 1.14 mmol/L (ref 0.5–1.9)
Lactic Acid, Venous: 1.42 mmol/L (ref 0.5–1.9)

## 2016-11-06 LAB — CBC WITH DIFFERENTIAL/PLATELET
Basophils Absolute: 0 10*3/uL (ref 0.0–0.1)
Basophils Relative: 0 %
EOS ABS: 0.1 10*3/uL (ref 0.0–0.7)
Eosinophils Relative: 1 %
HEMATOCRIT: 26 % — AB (ref 36.0–46.0)
HEMOGLOBIN: 9.3 g/dL — AB (ref 12.0–15.0)
Lymphocytes Relative: 20 %
Lymphs Abs: 1.1 10*3/uL (ref 0.7–4.0)
MCH: 31.3 pg (ref 26.0–34.0)
MCHC: 35.8 g/dL (ref 30.0–36.0)
MCV: 87.5 fL (ref 78.0–100.0)
MONO ABS: 0.1 10*3/uL (ref 0.1–1.0)
MONOS PCT: 2 %
NEUTROS ABS: 4.2 10*3/uL (ref 1.7–7.7)
NEUTROS PCT: 77 %
Platelets: 194 10*3/uL (ref 150–400)
RBC: 2.97 MIL/uL — ABNORMAL LOW (ref 3.87–5.11)
RDW: 18.4 % — AB (ref 11.5–15.5)
WBC: 5.4 10*3/uL (ref 4.0–10.5)

## 2016-11-06 LAB — TYPE AND SCREEN
ABO/RH(D): B POS
ANTIBODY SCREEN: NEGATIVE

## 2016-11-06 LAB — BRAIN NATRIURETIC PEPTIDE: B NATRIURETIC PEPTIDE 5: 71.9 pg/mL (ref 0.0–100.0)

## 2016-11-06 LAB — CBG MONITORING, ED
GLUCOSE-CAPILLARY: 47 mg/dL — AB (ref 65–99)
Glucose-Capillary: 103 mg/dL — ABNORMAL HIGH (ref 65–99)
Glucose-Capillary: 45 mg/dL — ABNORMAL LOW (ref 65–99)

## 2016-11-06 LAB — INFLUENZA PANEL BY PCR (TYPE A & B)
Influenza A By PCR: NEGATIVE
Influenza B By PCR: NEGATIVE

## 2016-11-06 LAB — LIPASE, BLOOD: Lipase: 14 U/L (ref 11–51)

## 2016-11-06 LAB — ABO/RH: ABO/RH(D): B POS

## 2016-11-06 LAB — I-STAT TROPONIN, ED: Troponin i, poc: 0.04 ng/mL (ref 0.00–0.08)

## 2016-11-06 MED ORDER — SODIUM CHLORIDE 0.9 % IV BOLUS (SEPSIS)
1000.0000 mL | Freq: Once | INTRAVENOUS | Status: AC
  Administered 2016-11-06: 1000 mL via INTRAVENOUS

## 2016-11-06 MED ORDER — HYDROCORTISONE 2.5 % RE CREA
1.0000 "application " | TOPICAL_CREAM | Freq: Four times a day (QID) | RECTAL | Status: DC
  Administered 2016-11-07: 1 via TOPICAL
  Filled 2016-11-06: qty 28.35

## 2016-11-06 MED ORDER — SACCHAROMYCES BOULARDII 250 MG PO CAPS
250.0000 mg | ORAL_CAPSULE | Freq: Two times a day (BID) | ORAL | Status: DC
  Administered 2016-11-07: 250 mg via ORAL
  Filled 2016-11-06 (×4): qty 1

## 2016-11-06 MED ORDER — APIXABAN 5 MG PO TABS
5.0000 mg | ORAL_TABLET | Freq: Two times a day (BID) | ORAL | Status: DC
  Administered 2016-11-07 (×2): 5 mg via ORAL
  Filled 2016-11-06 (×4): qty 1

## 2016-11-06 MED ORDER — VITAMIN B-1 100 MG PO TABS
100.0000 mg | ORAL_TABLET | Freq: Every day | ORAL | Status: DC
  Administered 2016-11-07: 100 mg via ORAL
  Filled 2016-11-06 (×2): qty 1

## 2016-11-06 MED ORDER — ENSURE ENLIVE PO LIQD: 237.0000 mL | Freq: Three times a day (TID) | ORAL | Status: DC

## 2016-11-06 MED ORDER — DEXTROSE 5 % IV SOLN
Freq: Once | INTRAVENOUS | Status: AC
  Administered 2016-11-06: 22:00:00 via INTRAVENOUS

## 2016-11-06 MED ORDER — CALCIUM CARBONATE 1250 (500 CA) MG PO TABS
1.0000 | ORAL_TABLET | Freq: Every day | ORAL | Status: DC
  Filled 2016-11-06 (×2): qty 1

## 2016-11-06 MED ORDER — LEVOTHYROXINE SODIUM 25 MCG PO TABS
25.0000 ug | ORAL_TABLET | Freq: Every day | ORAL | Status: DC
  Administered 2016-11-07: 25 ug via ORAL
  Filled 2016-11-06 (×2): qty 1

## 2016-11-06 MED ORDER — TRAZODONE HCL 50 MG PO TABS
25.0000 mg | ORAL_TABLET | Freq: Every evening | ORAL | Status: DC | PRN
  Administered 2016-11-07: 25 mg via ORAL
  Filled 2016-11-06 (×2): qty 0.5

## 2016-11-06 MED ORDER — VENLAFAXINE HCL 25 MG PO TABS
25.0000 mg | ORAL_TABLET | Freq: Two times a day (BID) | ORAL | Status: DC
  Administered 2016-11-07: 25 mg via ORAL
  Filled 2016-11-06 (×4): qty 1

## 2016-11-06 MED ORDER — MAGNESIUM OXIDE 400 (241.3 MG) MG PO TABS
400.0000 mg | ORAL_TABLET | Freq: Two times a day (BID) | ORAL | Status: DC
  Administered 2016-11-07 (×2): 400 mg via ORAL
  Filled 2016-11-06 (×4): qty 1

## 2016-11-06 NOTE — ED Provider Notes (Signed)
[facility]-EMERGENCY DEPT Provider Note   CSN: [id number] Arrival date & time: 11/06/16  1308     History   Chief Complaint Chief Complaint  Patient presents with  . Hypoglycemia    HPI [name] is a 56 y.o. female.  HPI   56 y.o.femalewith PMH of DM withdiabetic neuropathies, HTN, HPL, hypothyroidism, paroxysmal atrial fibrillation, DVT in June 2017 on apixaban, GERD, anemia, urosepsis, Chrondisease with diarrhea Presenting today with feelings of fatigue. Patient unable to quantify qualify or describe feelings. She states that she is occasionally dizzy, occasionally fatigued, occasionally has pain.   Denies shortness of breath. Denies rectal bleeding. Endorses she's been eating and drinking less than normal. Patient's son reported called EMS and she was found to be hypoglycemic.Son is not present.   Patient says she is unable to eat normally. We are unsure how much insulin she took or when.   Past Medical History:  Diagnosis Date  . Anxiety   . Arthritis    "a little bit; all over" (05/01/2016)  . Asthma   . Asthma   . C. difficile colitis   . Chronic lower back pain   . Crohn disease (HCC)   . Diabetes mellitus without complication (HCC)   . DVT (deep venous thrombosis) (HCC) ~ 03/2016   LLE  . Elevated lactic acid level   . GERD (gastroesophageal reflux disease)   . GI bleed 10/18/2016  . Glaucoma   . HLD (hyperlipidemia)   . Hypertension   . Hypothyroidism   . Neuropathy (HCC)   . Pancreatitis   . Pulmonary embolus (HCC) 05/24/2014  . Thrombocytopenia (HCC)   . Tobacco abuse     Patient Active Problem List   Diagnosis Date Noted  . Hypoglycemia 11/06/2016  . Transaminitis 11/06/2016  . Bleeding hemorrhoid 10/22/2016  . GIB (gastrointestinal bleeding) 10/18/2016  . Sacral decubitus ulcer, stage II 08/12/2016  . Altered mental status 08/11/2016  . Protein calorie malnutrition (HCC) 08/11/2016  . Diarrhea 08/11/2016  . Delirium 06/26/2016  . Pressure  injury of skin 06/26/2016  . Abdominal pain, generalized   . Abdominal pain   . AKI (acute kidney injury) (HCC)   . Sepsis (HCC)   . UTI (urinary tract infection) 02/28/2016  . 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 Excoriated rash-medial thighs 02/28/2016  . DVT (deep venous thrombosis), unspecified laterality 01/29/2016  . Bilateral tibial fractures   . Hypothyroidism   . GERD (gastroesophageal reflux disease)   . HLD (hyperlipidemia)   . Enteritis due to Clostridium difficile 03/23/2015  . Thrombocytopenia (HCC) 03/21/2015  . Weakness of back 03/21/2015  . Chronic pain 02/28/2015  . Severe protein-calorie malnutrition (HCC) 12/30/2014  . Hepatic steatosis 12/29/2014  . Chronic diarrhea of unknown origin 12/29/2014  . Elevated lactic acid level   . Dehydration 07/26/2014  . Arterial hypotension 07/26/2014  . History of pulmonary embolus (PE) 05/24/2014  . Physical deconditioning 05/14/2014  . Nicotine abuse 05/08/2014  . Anemia 10/15/2012  . Metabolic acidosis 10/14/2012  . Diabetes mellitus with complication (HCC) 05/12/2007  . Anxiety state 05/12/2007  . Depression 05/12/2007  . Peripheral neuropathy (HCC) 05/12/2007  . HTN (hypertension) 05/12/2007  . ALLERGIC RHINITIS 05/12/2007  . Asthma 05/12/2007  . PANCREATITIS, HX OF 05/12/2007    Past Surgical History:  Procedure Laterality Date  . CESAREAN SECTION  1996  . COLONOSCOPY N/A 03/02/2015   Procedure: COLONOSCOPY;  Surgeon: Bernette Redbird, MD;  Location: [facility] ENDOSCOPY;  Service: Endoscopy;  Laterality: N/A;  . ESOPHAGOGASTRODUODENOSCOPY N/A  05/21/2014   Procedure: ESOPHAGOGASTRODUODENOSCOPY (EGD);  Surgeon: Petra Kuba, MD;  Location: [facility] ENDOSCOPY;  Service: Endoscopy;  Laterality: N/A;  . ESOPHAGOGASTRODUODENOSCOPY N/A 03/02/2015   Procedure: ESOPHAGOGASTRODUODENOSCOPY (EGD);  Surgeon: Bernette Redbird, MD;  Location: [facility] ENDOSCOPY;  Service: Endoscopy;  Laterality: N/A;    OB History    No data available       Home  Medications    Prior to Admission medications   Medication Sig Start Date End Date Taking? Authorizing Provider  apixaban (ELIQUIS) 5 MG TABS tablet Take 1 tablet (5 mg total) by mouth 2 (two) times daily. 05/02/16  Yes Richarda Overlie, MD  calcium carbonate (OS-CAL - DOSED IN MG OF ELEMENTAL CALCIUM) 1250 (500 CA) MG tablet Take 1 tablet (500 mg of elemental calcium total) by mouth daily with breakfast. 03/04/15  Yes Alison Murray, MD  feeding supplement, ENSURE ENLIVE, (ENSURE ENLIVE) LIQD Take 237 mLs by mouth 3 (three) times daily between meals. 10/22/16  Yes Nishant Dhungel, MD  hydrocortisone (ANUSOL-HC) 2.5 % rectal cream Apply topically 4 (four) times daily. Patient taking differently: Apply 1 application topically 4 (four) times daily.  10/22/16  Yes Nishant Dhungel, MD  HYDROmorphone (DILAUDID) 4 MG tablet Take 8 mg by mouth every 4 (four) hours as needed for severe pain.   Yes Historical Provider, MD  levothyroxine (SYNTHROID, LEVOTHROID) 25 MCG tablet Take 1 tablet (25 mcg total) by mouth daily before breakfast. 03/06/16  Yes Richarda Overlie, MD  magnesium oxide (MAG-OX) 400 MG tablet Take 1 tablet (400 mg total) by mouth 2 (two) times daily. 08/15/16  Yes Osvaldo Shipper, MD  potassium chloride (MICRO-K) 10 MEQ CR capsule Take 20 mEq by mouth 2 (two) times daily.   Yes Historical Provider, MD  saccharomyces boulardii (FLORASTOR) 250 MG capsule Take 1 capsule (250 mg total) by mouth 2 (two) times daily. 10/22/16  Yes Nishant Dhungel, MD  thiamine (VITAMIN B-1) 100 MG tablet Take 1 tablet (100 mg total) by mouth daily. 03/06/16  Yes Richarda Overlie, MD  traZODone (DESYREL) 25 mg TABS tablet Take 0.5 tablets (25 mg total) by mouth at bedtime as needed for sleep. 08/02/14  Yes Kathlen Mody, MD  venlafaxine (EFFEXOR) 25 MG tablet Take 1 tablet (25 mg total) by mouth 2 (two) times daily. 07/01/16  Yes Dorothea Ogle, MD    Family History Family History  Problem Relation Age of Onset  . Breast cancer Mother   .  Heart disease Mother 19    CABG  . Diabetes Mother   . Heart disease Father 80    CABG  . Diabetes Father     Social History Social History  Substance Use Topics  . Smoking status: Current Every Day Smoker    Packs/day: 0.25    Years: 37.00    Types: Cigarettes  . Smokeless tobacco: Never Used  . Alcohol use No     Allergies   Iohexol; Spiriva handihaler [tiotropium bromide monohydrate]; Iodinated diagnostic agents; Ativan [lorazepam]; and Penicillins   Review of Systems Review of Systems  Constitutional: Positive for fatigue. Negative for activity change and fever.  Respiratory: Negative for shortness of breath.   Cardiovascular: Negative for chest pain.  Neurological: Positive for dizziness.  All other systems reviewed and are negative.    Physical Exam Updated Vital Signs BP 124/82 (BP Location: Right Arm)   Pulse 105   Temp 97.8 F (36.6 C) (Oral)   Resp 18   LMP 10/01/2010 Comment: Has depro  SpO2  100%   Physical Exam  Constitutional: She appears well-developed and well-nourished.  HENT:  Head: Normocephalic and atraumatic.  Significant proptosis.  Eyes: Right eye exhibits no discharge. Left eye exhibits no discharge.  Cardiovascular: Normal rate and regular rhythm.   No murmur heard. Pulmonary/Chest: Effort normal and breath sounds normal. No respiratory distress. She has no wheezes. She has no rales.  Neurological: She is alert. No cranial nerve deficit.  Skin: Skin is warm and dry. She is not diaphoretic. There is pallor.  Psychiatric: She has a normal mood and affect.  Nursing note and vitals reviewed.    ED Treatments / Results  Labs (all labs ordered are listed, but only abnormal results are displayed) Labs Reviewed  COMPREHENSIVE METABOLIC PANEL - Abnormal; Notable for the following:       Result Value   Chloride 113 (*)    CO2 19 (*)    Glucose, Bld 58 (*)    Calcium 7.6 (*)    Total Protein 4.6 (*)    Albumin 2.0 (*)    AST 205 (*)     ALT 81 (*)    Total Bilirubin 1.6 (*)    All other components within normal limits  CBC WITH DIFFERENTIAL/PLATELET - Abnormal; Notable for the following:    RBC 2.97 (*)    Hemoglobin 9.3 (*)    HCT 26.0 (*)    RDW 18.4 (*)    All other components within normal limits  CBG MONITORING, ED - Abnormal; Notable for the following:    Glucose-Capillary 103 (*)    All other components within normal limits  CBG MONITORING, ED - Abnormal; Notable for the following:    Glucose-Capillary 45 (*)    All other components within normal limits  CBG MONITORING, ED - Abnormal; Notable for the following:    Glucose-Capillary 47 (*)    All other components within normal limits  URINE CULTURE  LIPASE, BLOOD  BRAIN NATRIURETIC PEPTIDE  INFLUENZA PANEL BY PCR (TYPE A & B)  URINALYSIS, ROUTINE W REFLEX MICROSCOPIC  AMMONIA  RAPID URINE DRUG SCREEN, HOSP PERFORMED  ETHANOL  PHOSPHORUS  MAGNESIUM  COMPREHENSIVE METABOLIC PANEL  I-STAT CG4 LACTIC ACID, ED  I-STAT TROPOININ, ED  I-STAT CG4 LACTIC ACID, ED  TYPE AND SCREEN  ABO/RH    EKG  EKG Interpretation None       Radiology Dg Chest 2 View  Result Date: 11/06/2016 CLINICAL DATA:  56 year old with current history of diabetes, hypertension, hyperlipidemia, hypothyroidism and atrial fibrillation, presenting with fatigue and generalized malaise. EXAM: CHEST  2 VIEW COMPARISON:  09/17/2016, 08/11/2016 and earlier. FINDINGS: AP semi-erect and lateral images were obtained. Cardiac silhouette normal in size, unchanged. Thoracic aorta mildly tortuous and atherosclerotic. Hilar and mediastinal contours otherwise unremarkable. Lungs clear. Bronchovascular markings normal. Pulmonary vascularity normal. Possible left pleural effusion layering posteriorly on the lateral image. Slight thoracolumbar dextroscoliosis. IMPRESSION: 1. Possible left pleural effusion. No acute cardiopulmonary disease otherwise. 2. Mild thoracic aortic atherosclerosis.  Electronically Signed   By: Hulan Saas M.D.   On: 11/06/2016 16:07   US Abdomen Limited Ruq  Result Date: 11/06/2016 CLINICAL DATA:  Elevated LFTs EXAM: US ABDOMEN LIMITED - RIGHT UPPER QUADRANT COMPARISON:  10/05/2013, 05/15/2016 FINDINGS: Gallbladder: Gallbladder is well distended. A single gallbladder polyp is again identified and stable. No wall thickening or pericholecystic fluid is noted. Common bile duct: Diameter: 11 mm. This is relatively stable from prior CT examination. Liver: Increased in echogenicity consistent with fatty infiltration. No focal lesion  is noted. IMPRESSION: Common bile duct dilatation stable over multiple previous exams. Small gallbladder polyp. Fatty liver. Electronically Signed   By: Alcide Clever M.D.   On: 11/06/2016 20:33    Procedures Procedures (including critical care time)  Medications Ordered in ED Medications  hydrocortisone (ANUSOL-HC) 2.5 % rectal cream 1 application (not administered)  apixaban (ELIQUIS) tablet 5 mg (not administered)  feeding supplement (ENSURE ENLIVE) (ENSURE ENLIVE) liquid 237 mL (not administered)  thiamine (VITAMIN B-1) tablet 100 mg (not administered)  magnesium oxide (MAG-OX) tablet 400 mg (not administered)  levothyroxine (SYNTHROID, LEVOTHROID) tablet 25 mcg (not administered)  saccharomyces boulardii (FLORASTOR) capsule 250 mg (not administered)  traZODone (DESYREL) tablet 25 mg (not administered)  venlafaxine (EFFEXOR) tablet 25 mg (not administered)  calcium carbonate (OS-CAL - dosed in mg of elemental calcium) tablet 500 mg of elemental calcium (not administered)  sodium chloride 0.9 % bolus 1,000 mL (0 mLs Intravenous Stopped 11/06/16 1956)  sodium chloride 0.9 % bolus 1,000 mL (0 mLs Intravenous Stopped 11/06/16 2153)  dextrose 5 % solution ( Intravenous New Bag/Given 11/06/16 2154)     Initial Impression / Assessment and Plan / ED Course  I have reviewed the triage vital signs and the nursing notes.  Pertinent  labs & imaging results that were available during my care of the patient were reviewed by me and considered in my medical decision making (see chart for details).    Patient is a 56 year old female appearing older than stated age presenting today with hypoglycemia. Patient unable to give a good history at this time. We will continue to monitor her sugars as well as do a broad workup. Patient says she feels weak and dizzy. Patient does appear pale. She has history of GI bleed so we will address that as well as look for any evidence of infection.   Patient continued to have low BG, was refusing to take PO. Mildly elevated lizer enzymes, normal RUQ scan.  D5 started. Will admit. Unclear if low sugar from erroneous insulin or lack of PO intake.   Final Clinical Impressions(s) / ED Diagnoses   Final diagnoses:  Elevated liver enzymes  Hypoglycemia    New Prescriptions New Prescriptions   No medications on file     Srah Ake Randall An, MD 11/07/16 [phone]

## 2016-11-06 NOTE — ED Notes (Signed)
Attempted to give pt orange juice for low blood sugar, but pt refuses to drink it.

## 2016-11-06 NOTE — ED Notes (Signed)
Bed: [facility]16 Expected date:  Expected time:  Means of arrival:  Comments: EMS-fever 

## 2016-11-06 NOTE — H&P (Addendum)
History and Physical    [name]:[id number] DOB: 11/11/1960 DOA: 11/06/2016   PCP: Georgann Housekeeper, MD Chief Complaint:  Chief Complaint  Patient presents with  . Hypoglycemia    HPI: [name] is a 56 y.o. female with medical history significant of HTN, hypothyroidism, DVT on eliquis.  Chronic abd pain, questionable Chron's disease.  She was most recently admitted last month to our service for bleeding hemorrhoid.  This was observed and managed with hydrocortisone cream and patient was discharged.  Since discharge she basically hasnt been eating much of anything.  She states this is because her dentures need to be fixed and she cant sit up because it hurts because of the hemorrhoid.  She is brought in to the ED today for AMS for past 5 days per son, and hypoglycemia with BGL 39.  She is a difficult historian, slow to respond to questions and questions must be very direct.  She is alert and oriented though, and review of her prior H+Ps and numerous admission chart notes leads me to believe that this is baseline.  ED Course: Work up demonstrates transaminitis, US gallbladder shows chronically dilated CBD.  HGB is 9.3, down just slightly from the 10 that it was during last admission.  Review of Systems: As per HPI otherwise 10 point review of systems negative.    Past Medical History:  Diagnosis Date  . Anxiety   . Arthritis    "a little bit; all over" (05/01/2016)  . Asthma   . Asthma   . C. difficile colitis   . Chronic lower back pain   . Crohn disease (HCC)   . Diabetes mellitus without complication (HCC)   . DVT (deep venous thrombosis) (HCC) ~ 03/2016   LLE  . Elevated lactic acid level   . GERD (gastroesophageal reflux disease)   . GI bleed 10/18/2016  . Glaucoma   . HLD (hyperlipidemia)   . Hypertension   . Hypothyroidism   . Neuropathy (HCC)   . Pancreatitis   . Pulmonary embolus (HCC) 05/24/2014  . Thrombocytopenia (HCC)   . Tobacco abuse      Past Surgical History:  Procedure Laterality Date  . CESAREAN SECTION  1996  . COLONOSCOPY N/A 03/02/2015   Procedure: COLONOSCOPY;  Surgeon: Bernette Redbird, MD;  Location: [facility] ENDOSCOPY;  Service: Endoscopy;  Laterality: N/A;  . ESOPHAGOGASTRODUODENOSCOPY N/A 05/21/2014   Procedure: ESOPHAGOGASTRODUODENOSCOPY (EGD);  Surgeon: Petra Kuba, MD;  Location[facility] ENDOSCOPY;  Service: Endoscopy;  Laterality: N/A;  . ESOPHAGOGASTRODUODENOSCOPY N/A 03/02/2015   Procedure: ESOPHAGOGASTRODUODENOSCOPY (EGD);  Surgeon: Bernette Redbird, MD;  Location: [facility] ENDOSCOPY;  Service: Endoscopy;  Laterality: N/A;     reports that she has been smoking Cigarettes.  She has a 9.25 pack-year smoking history. She has never used smokeless tobacco. She reports that she does not drink alcohol or use drugs.  Allergies  Allergen Reactions  . Iohexol Other (See Comments)    "severe burning" Patient has received Contrast in 2005 with 13 hour pre-medication, and had no reaction at that time  . Spiriva Handihaler [Tiotropium Bromide Monohydrate] Nausea And Vomiting  . Iodinated Diagnostic Agents Other (See Comments)    Feels like she is "on fire" for months afterwards, if exposed  . Ativan [Lorazepam] Other (See Comments)    Hallucinations/confusion/"swatting flies"  . Penicillins Hives    Has had cephalosporins Has patient had a PCN reaction causing immediate rash, facial/tongue/throat swelling, SOB or lightheadedness with hypotension: Yes Has patient had a PCN  reaction causing severe rash involving mucus membranes or skin necrosis: Yes Has patient had a PCN reaction that required hospitalization: Yes Has patient had a PCN reaction occurring within the last 10 years: No If all of the above answers are "NO", then may proceed with Cephalosporin use.    Family History  Problem Relation Age of Onset  . Breast cancer Mother   . Heart disease Mother 90    CABG  . Diabetes Mother   . Heart disease Father 102    CABG  .  Diabetes Father       Prior to Admission medications   Medication Sig Start Date End Date Taking? Authorizing Provider  apixaban (ELIQUIS) 5 MG TABS tablet Take 1 tablet (5 mg total) by mouth 2 (two) times daily. 05/02/16  Yes Richarda Overlie, MD  calcium carbonate (OS-CAL - DOSED IN MG OF ELEMENTAL CALCIUM) 1250 (500 CA) MG tablet Take 1 tablet (500 mg of elemental calcium total) by mouth daily with breakfast. 03/04/15  Yes Alison Murray, MD  feeding supplement, ENSURE ENLIVE, (ENSURE ENLIVE) LIQD Take 237 mLs by mouth 3 (three) times daily between meals. 10/22/16  Yes Nishant Dhungel, MD  hydrocortisone (ANUSOL-HC) 2.5 % rectal cream Apply topically 4 (four) times daily. Patient taking differently: Apply 1 application topically 4 (four) times daily.  10/22/16  Yes Nishant Dhungel, MD  HYDROmorphone (DILAUDID) 4 MG tablet Take 8 mg by mouth every 4 (four) hours as needed for severe pain.   Yes Historical Provider, MD  levothyroxine (SYNTHROID, LEVOTHROID) 25 MCG tablet Take 1 tablet (25 mcg total) by mouth daily before breakfast. 03/06/16  Yes Richarda Overlie, MD  magnesium oxide (MAG-OX) 400 MG tablet Take 1 tablet (400 mg total) by mouth 2 (two) times daily. 08/15/16  Yes Osvaldo Shipper, MD  potassium chloride (MICRO-K) 10 MEQ CR capsule Take 20 mEq by mouth 2 (two) times daily.   Yes Historical Provider, MD  saccharomyces boulardii (FLORASTOR) 250 MG capsule Take 1 capsule (250 mg total) by mouth 2 (two) times daily. 10/22/16  Yes Nishant Dhungel, MD  thiamine (VITAMIN B-1) 100 MG tablet Take 1 tablet (100 mg total) by mouth daily. 03/06/16  Yes Richarda Overlie, MD  traZODone (DESYREL) 25 mg TABS tablet Take 0.5 tablets (25 mg total) by mouth at bedtime as needed for sleep. 08/02/14  Yes Kathlen Mody, MD  venlafaxine (EFFEXOR) 25 MG tablet Take 1 tablet (25 mg total) by mouth 2 (two) times daily. 07/01/16  Yes Dorothea Ogle, MD    Physical Exam: Vitals:   11/06/16 1545 11/06/16 1744 11/06/16 1938 11/06/16 2148    BP: 98/82 98/84 99/83  124/82  Pulse: 88 101 94 105  Resp: 21 18 18 18   Temp:      TempSrc:      SpO2: 100% 100% 100% 100%      Constitutional: NAD, calm, comfortable, Cachectic Eyes: PERRL, lids and conjunctivae normal ENMT: Mucous membranes are moist. Posterior pharynx clear of any exudate or lesions.Normal dentition.  Neck: normal, supple, no masses, no thyromegaly Respiratory: clear to auscultation bilaterally, no wheezing, no crackles. Normal respiratory effort. No accessory muscle use.  Cardiovascular: Regular rate and rhythm, no murmurs / rubs / gallops. No extremity edema. 2+ pedal pulses. No carotid bruits.  Abdomen: no tenderness, no masses palpated. No hepatosplenomegaly. Bowel sounds positive.  Musculoskeletal: no clubbing / cyanosis. No joint deformity upper and lower extremities. Good ROM, no contractures. Normal muscle tone.  Skin: no rashes, lesions, ulcers. No induration  Neurologic: CN 2-12 grossly intact. Sensation intact, DTR normal. Strength 5/5 in all 4.  Psychiatric: Normal judgment and insight. Alert and oriented x 3. Normal mood.    Labs on Admission: I have personally reviewed following labs and imaging studies  CBC:  Recent Labs Lab 11/06/16 1731  WBC 5.4  NEUTROABS 4.2  HGB 9.3*  HCT 26.0*  MCV 87.5  PLT 194   Basic Metabolic Panel:  Recent Labs Lab 11/06/16 1731  NA 144  K 4.1  CL 113*  CO2 19*  GLUCOSE 58*  BUN 7  CREATININE 0.87  CALCIUM 7.6*   GFR: CrCl cannot be calculated (Unknown ideal weight.). Liver Function Tests:  Recent Labs Lab 11/06/16 1731  AST 205*  ALT 81*  ALKPHOS 77  BILITOT 1.6*  PROT 4.6*  ALBUMIN 2.0*    Recent Labs Lab 11/06/16 1731  LIPASE 14   No results for input(s): AMMONIA in the last 168 hours. Coagulation Profile: No results for input(s): INR, PROTIME in the last 168 hours. Cardiac Enzymes: No results for input(s): CKTOTAL, CKMB, CKMBINDEX, TROPONINI in the last 168 hours. BNP (last 3  results) No results for input(s): PROBNP in the last 8760 hours. HbA1C: No results for input(s): HGBA1C in the last 72 hours. CBG:  Recent Labs Lab 11/06/16 1327 11/06/16 1957 11/06/16 2127  GLUCAP 103* 45* 47*   Lipid Profile: No results for input(s): CHOL, HDL, LDLCALC, TRIG, CHOLHDL, LDLDIRECT in the last 72 hours. Thyroid Function Tests: No results for input(s): TSH, T4TOTAL, FREET4, T3FREE, THYROIDAB in the last 72 hours. Anemia Panel: No results for input(s): VITAMINB12, FOLATE, FERRITIN, TIBC, IRON, RETICCTPCT in the last 72 hours. Urine analysis:    Component Value Date/Time   COLORURINE YELLOW 10/18/2016 1415   APPEARANCEUR CLEAR 10/18/2016 1415   LABSPEC 1.028 10/18/2016 1415   PHURINE 5.0 10/18/2016 1415   GLUCOSEU NEGATIVE 10/18/2016 1415   HGBUR NEGATIVE 10/18/2016 1415   BILIRUBINUR NEGATIVE 10/18/2016 1415   KETONESUR 5 (A) 10/18/2016 1415   PROTEINUR NEGATIVE 10/18/2016 1415   UROBILINOGEN 1.0 02/28/2015 1227   NITRITE NEGATIVE 10/18/2016 1415   LEUKOCYTESUR NEGATIVE 10/18/2016 1415   Sepsis Labs: @LABRCNTIP (procalcitonin:4,lacticidven:4) )No results found for this or any previous visit (from the past 240 hour(s)).   Radiological Exams on Admission: Dg Chest 2 View  Result Date: 11/06/2016 CLINICAL DATA:  56 year old with current history of diabetes, hypertension, hyperlipidemia, hypothyroidism and atrial fibrillation, presenting with fatigue and generalized malaise. EXAM: CHEST  2 VIEW COMPARISON:  09/17/2016, 08/11/2016 and earlier. FINDINGS: AP semi-erect and lateral images were obtained. Cardiac silhouette normal in size, unchanged. Thoracic aorta mildly tortuous and atherosclerotic. Hilar and mediastinal contours otherwise unremarkable. Lungs clear. Bronchovascular markings normal. Pulmonary vascularity normal. Possible left pleural effusion layering posteriorly on the lateral image. Slight thoracolumbar dextroscoliosis. IMPRESSION: 1. Possible left  pleural effusion. No acute cardiopulmonary disease otherwise. 2. Mild thoracic aortic atherosclerosis. Electronically Signed   By: Hulan Saas M.D.   On: 11/06/2016 16:07   US Abdomen Limited Ruq  Result Date: 11/06/2016 CLINICAL DATA:  Elevated LFTs EXAM: US ABDOMEN LIMITED - RIGHT UPPER QUADRANT COMPARISON:  10/05/2013, 05/15/2016 FINDINGS: Gallbladder: Gallbladder is well distended. A single gallbladder polyp is again identified and stable. No wall thickening or pericholecystic fluid is noted. Common bile duct: Diameter: 11 mm. This is relatively stable from prior CT examination. Liver: Increased in echogenicity consistent with fatty infiltration. No focal lesion is noted. IMPRESSION: Common bile duct dilatation stable over multiple previous exams. Small gallbladder polyp.  Fatty liver. Electronically Signed   By: Alcide Clever M.D.   On: 11/06/2016 20:33    EKG: Independently reviewed.  Assessment/Plan Principal Problem:   Hypoglycemia Active Problems:   Depression   Chronic pain   May-Thurner syndrome   Bleeding hemorrhoid   Transaminitis    1. Transaminitis - 1. Repeat CMP in AM 2. Consider GI consult in AM 3. Hepatitis was previously negative during prior stay 4. [place] LUQ just shows chronic dilation of bile ducts 5. Looking back it seems that the transaminitis is chronic (see H+P on 01/29/16).  Extensive work up in the past has been negative. 2. Hypoglycemia - 1. Not eating? Patient denies taking insulin or other DM meds 2. Insulinoma? 3. Getting C-peptide 4. D5 gtt 5. Serial checks over night Q1H 3. May-thurner Syndrome - continue eliquis 4. Chronic pain - 1. Due to AMS holding dilaudid 2. May want to restart this depending on course in hospital 5. Hemorrhoid - 1. Continue anusol 2. May need surgical management but unclear if they will do this inpatient   DVT prophylaxis: Eliquis Code Status: Full Family Communication: No family in room Consults called:  None Admission status: Admit to obs   Hillary Bow DO [facility] Pager [phone] from 7PM-7AM  If 7AM-7PM, please contact the day physician for the patient [email or web address] Password [email or web address]  11/06/2016, 10:48 PM

## 2016-11-06 NOTE — ED Notes (Signed)
Asked pt if she could give [place] a urine sample, but said she just went in her diaper.

## 2016-11-06 NOTE — ED Triage Notes (Signed)
Pt from home , AMS x 5 days . Low CBG 39 with EMS, was given 150 D 10. Also hypotensive 89/66. Rechecked CBG, 149.  Hx GI bleed.  HR 100.  Pt has home nursing.

## 2016-11-06 NOTE — ED Notes (Signed)
Unsuccessful IV attempt x 2 by this RN.  

## 2016-11-06 NOTE — ED Notes (Signed)
Asked pt if she could give a urine sample, but said she is not able to. 

## 2016-11-06 NOTE — ED Notes (Signed)
Pt sts she's got to use the bathroom but will not use the bedpan, she's just going to go in her diaper.

## 2016-11-06 NOTE — ED Notes (Signed)
Pt blood sugar continues to be low. Pt refusing to drink juice or take any other corrective measures at this time.

## 2016-11-06 NOTE — ED Notes (Signed)
Pt refused to walk. Informed EDP and RN.

## 2016-11-06 NOTE — ED Notes (Signed)
Unsuccessful IV attempt by this RN 

## 2016-11-06 NOTE — ED Notes (Signed)
Writer attempted to collect urine from pt, pt sts she will call when she has to go.

## 2016-11-07 DIAGNOSIS — R740 Nonspecific elevation of levels of transaminase and lactic acid dehydrogenase [LDH]: Secondary | ICD-10-CM

## 2016-11-07 DIAGNOSIS — E162 Hypoglycemia, unspecified: Secondary | ICD-10-CM | POA: Diagnosis not present

## 2016-11-07 DIAGNOSIS — K649 Unspecified hemorrhoids: Secondary | ICD-10-CM

## 2016-11-07 DIAGNOSIS — I871 Compression of vein: Secondary | ICD-10-CM

## 2016-11-07 DIAGNOSIS — E11649 Type 2 diabetes mellitus with hypoglycemia without coma: Secondary | ICD-10-CM | POA: Diagnosis not present

## 2016-11-07 DIAGNOSIS — G894 Chronic pain syndrome: Secondary | ICD-10-CM | POA: Diagnosis not present

## 2016-11-07 DIAGNOSIS — R531 Weakness: Secondary | ICD-10-CM | POA: Diagnosis not present

## 2016-11-07 DIAGNOSIS — E118 Type 2 diabetes mellitus with unspecified complications: Secondary | ICD-10-CM | POA: Diagnosis not present

## 2016-11-07 LAB — COMPREHENSIVE METABOLIC PANEL
ALK PHOS: 68 U/L (ref 38–126)
ALT: 61 U/L — ABNORMAL HIGH (ref 14–54)
AST: 98 U/L — AB (ref 15–41)
Albumin: 1.6 g/dL — ABNORMAL LOW (ref 3.5–5.0)
Anion gap: 10 (ref 5–15)
BILIRUBIN TOTAL: 0.9 mg/dL (ref 0.3–1.2)
BUN: 6 mg/dL (ref 6–20)
CALCIUM: 6.9 mg/dL — AB (ref 8.9–10.3)
CO2: 19 mmol/L — ABNORMAL LOW (ref 22–32)
Chloride: 109 mmol/L (ref 101–111)
Creatinine, Ser: 0.69 mg/dL (ref 0.44–1.00)
GFR calc Af Amer: >60 mL/min (ref >60)
GLUCOSE: 197 mg/dL — AB (ref 65–99)
POTASSIUM: 3.1 mmol/L — AB (ref 3.5–5.1)
Sodium: 138 mmol/L (ref 135–145)
TOTAL PROTEIN: 3.7 g/dL — AB (ref 6.5–8.1)

## 2016-11-07 LAB — GLUCOSE, CAPILLARY
GLUCOSE-CAPILLARY: 89 mg/dL (ref 65–99)
Glucose-Capillary: 196 mg/dL — ABNORMAL HIGH (ref 65–99)

## 2016-11-07 LAB — RAPID URINE DRUG SCREEN, HOSP PERFORMED
Amphetamines: NOT DETECTED
BARBITURATES: NOT DETECTED
Benzodiazepines: NOT DETECTED
Cocaine: NOT DETECTED
Opiates: POSITIVE — AB
Tetrahydrocannabinol: NOT DETECTED

## 2016-11-07 LAB — PHOSPHORUS: PHOSPHORUS: 2.6 mg/dL (ref 2.5–4.6)

## 2016-11-07 LAB — URINALYSIS, ROUTINE W REFLEX MICROSCOPIC
BILIRUBIN URINE: NEGATIVE
GLUCOSE, UA: NEGATIVE mg/dL
HGB URINE DIPSTICK: NEGATIVE
KETONES UR: 5 mg/dL — AB
NITRITE: NEGATIVE
PH: 5 (ref 5.0–8.0)
PROTEIN: NEGATIVE mg/dL
Specific Gravity, Urine: 1.015 (ref 1.005–1.030)

## 2016-11-07 LAB — CBG MONITORING, ED
GLUCOSE-CAPILLARY: 86 mg/dL (ref 65–99)
Glucose-Capillary: 80 mg/dL (ref 65–99)

## 2016-11-07 LAB — ETHANOL: Alcohol, Ethyl (B): <5 mg/dL (ref <5)

## 2016-11-07 LAB — MAGNESIUM
MAGNESIUM: 1.2 mg/dL — AB (ref 1.7–2.4)
MAGNESIUM: 2.1 mg/dL (ref 1.7–2.4)

## 2016-11-07 MED ORDER — SODIUM CHLORIDE 0.9 % IV BOLUS (SEPSIS)
500.0000 mL | Freq: Once | INTRAVENOUS | Status: AC
  Administered 2016-11-07: 500 mL via INTRAVENOUS

## 2016-11-07 MED ORDER — BOOST / RESOURCE BREEZE PO LIQD
1.0000 | Freq: Three times a day (TID) | ORAL | Status: DC
  Administered 2016-11-07 (×2): 1 via ORAL
  Filled 2016-11-07 (×4): qty 1

## 2016-11-07 MED ORDER — HYDROMORPHONE HCL 4 MG PO TABS
8.0000 mg | ORAL_TABLET | ORAL | Status: DC | PRN
  Administered 2016-11-07: 8 mg via ORAL
  Filled 2016-11-07: qty 4
  Filled 2016-11-07: qty 2
  Filled 2016-11-07 (×2): qty 4

## 2016-11-07 MED ORDER — POTASSIUM CHLORIDE CRYS ER 20 MEQ PO TBCR
40.0000 meq | EXTENDED_RELEASE_TABLET | Freq: Once | ORAL | Status: AC
  Administered 2016-11-07: 40 meq via ORAL
  Filled 2016-11-07: qty 2

## 2016-11-07 MED ORDER — BISACODYL 10 MG RE SUPP
10.0000 mg | Freq: Once | RECTAL | Status: DC
  Filled 2016-11-07: qty 1

## 2016-11-07 MED ORDER — MAGNESIUM SULFATE 2 GM/50ML IV SOLN
2.0000 g | Freq: Once | INTRAVENOUS | Status: AC
  Administered 2016-11-07: 2 g via INTRAVENOUS
  Filled 2016-11-07: qty 50

## 2016-11-07 NOTE — Progress Notes (Addendum)
PT Note  Patient Details Name: [name] MRN: [id number] DOB: [birth year]      Order received, chart reviewed. Attempted to see patient, she stated understood the need to get OOB, and she knew she wanted to go home later today, however continued to refuse moving at this time (even when nurse tried to explain). Will attempt after patient eats breakfast later this morning.   Pt stated she has not been doing much at home either, however she states someone is always with her.    Marella Bile 11/07/2016, 10:39 AM  Marella Bile, PT Pager: [phone] 11/07/2016

## 2016-11-07 NOTE — Progress Notes (Signed)
PT Note  Patient Details Name: [name] MRN: [id number] DOB: 02/05/1961  Attempted to assess the patient again with nursing at bedside as well. Pt very resistant verbally and physically to any discussion and movement, and had numerous excuses. At this time we were just wanting pt to try to urinate on bedside commode or bedpan for urine specimen. Pt did roll for bedpan, with multiple verbal cues and coaxing to get this completed. Pt was verbally abusive to our help as well.   We tried to get patient to answer simple questions about her care at home, however pateint very limited with her answers, so unclear of what her help is at home. Kim the nurse was still trying to assess this.    At this time no formal assessment complete, however will need 24/7 care .   Thank you to the nurse, Selena Batten, for helping with this patient.   Marella Bile 11/07/2016, 1:21 PM  Marella Bile, PT Pager: [phone] 11/07/2016

## 2016-11-07 NOTE — Discharge Summary (Signed)
Physician Discharge Summary  [name] YTW:[id number] DOB: Apr 29, 1961 DOA: 11/06/2016  PCP: Georgann Housekeeper, MD  Admit date: 11/06/2016 Discharge date: 11/07/2016  Time spent: 45 minutes  Recommendations for Outpatient Follow-up:  Patient will be discharged to home.  Patient will need to follow up with primary care provider within one week of discharge, repeat CMP.  Follow up with gastroenterology. Patient should continue medications as prescribed.  Patient should follow a regular diet.   Discharge Diagnoses:  Transaminitis Hypoglycemia May Thurner syndrome Chronic pain Hemorrhoids  Severe malnutrition Hypocalcemia Severe deconditioning  Discharge Condition: Stable  Diet recommendation: Regular  There were no vitals filed for this visit.  History of present illness:  On 11/06/2016 by Dr. Lyda Perone [name] is a 56 y.o. female with medical history significant of HTN, hypothyroidism, DVT on eliquis.  Chronic abd pain, questionable Chron's disease.  She was most recently admitted last month to our service for bleeding hemorrhoid.  This was observed and managed with hydrocortisone cream and patient was discharged.  Since discharge she basically hasnt been eating much of anything.  She states this is because her dentures need to be fixed and she cant sit up because it hurts because of the hemorrhoid.  She is brought in to the ED today for AMS for past 5 days per son, and hypoglycemia with BGL 39.  She is a difficult historian, slow to respond to questions and questions must be very direct.  She is alert and oriented though, and review of her prior H+Ps and numerous admission chart notes leads me to believe that this is baseline.  Hospital Course:  Transaminitis -Possibly secondary dehydration -LFTs trending downward (AST 205 --> 98, ALT 81 --> 61) -Abdominal, right upper quadrant ultrasound showed common bile duct dilatation stable over multiple previous exams. Fatty  liver -Repeat BMP in 1 week  Hypoglycemia -Patient does not take any insulin or diabetic medications -Suspect secondary to poor oral intake -Patient following for further lab testing and monitoring -Patient was placed on D5, blood sugars have improved  May Thurner syndrome, H/o PE/DVT -Continue Eliquis  Chronic pain -Patient takes quite a dose of Dilaudid at home, was held in the hospital due to question of altered mental status.  -Patient's mental status is appeared to be at baseline.  -Continue medications, would not continue current dose given patient's current body habitus and malnutrition.  Hemorrhoids  -Would not allow for examination.  -Continue Anusol  -Follow-up with PCP for possible GI versus surgical referral  -Of note upon reviewing the old records, patient has had rectal bleeding secondary to external hemorrhoids. She was recently hospitalized and discharged 10/22/2016.  -Patient was seen by gastroenterology at that time and the recommended hydrocortisone suppositories and cream along with florastor.   Severe malnutrition -Possibly multifactorial including patient's poor oral intake secondary to nonfitting dentures. Have explained to patient that she should follow-up with her dentist or dental surgeon. -Albumin 1.6 -Continue supplements  Hypothyroidism -Continue Synthroid  Hypocalcemia -Secondary to poor oral intake, corrected calcium 8.8 -Continue Tums  Depression -Continue Effexor  Asymptomatic bacteriuria  -UA showed many bacteria, TNTC WBC, 6-30 squamous epithelial cells, large leukocytes -Patient does not complain of any dysuria -Would not treat at this time.  Severe deconditioning -PT consulted our patient has refused evaluation. It appears that since September 2017, several attempts for PT to evaluate patient and have been refused. -Patient has been very reluctant in having any type of conversation regarding her current condition or allowing [place]  to  help her anyway. Feel that patient does need nursing home placement however at this time patient not cooperative. Case management social work consulted to assist in discharging patient to home.  Procedures: None  Consultations: None  Discharge Exam: Vitals:   11/07/16 0240 11/07/16 0600  BP: 103/74 90/70  Pulse: 87 93  Resp: 15 15  Temp: 99.1 F (37.3 C) 97.8 F (36.6 C)     General: Well developed, Chronically ill-appearing, malnourished, appears older than stated age   HEENT: NCAT, mucous membranes moist.  Cardiovascular: S1 S2 auscultated, no rubs, murmurs or gallops. Regular rate and rhythm.  Respiratory: Clear to auscultation bilaterally with equal chest rise  Abdomen: Soft, nontender, nondistended, + bowel sounds  Extremities: warm dry without cyanosis clubbing or edema, muscle wasting  Neuro: AAOx3, nonfocal  Psych: uninterested and uncooperative with examination   Discharge Instructions Discharge Instructions    Discharge instructions    Complete by:  As directed    Patient will be discharged to home.  Patient will need to follow up with primary care provider within one week of discharge, repeat CMP. Follow up with gastroenterology.  Patient should continue medications as prescribed.  Patient should follow a regular diet.     Current Discharge Medication List    CONTINUE these medications which have NOT CHANGED   Details  apixaban (ELIQUIS) 5 MG TABS tablet Take 1 tablet (5 mg total) by mouth 2 (two) times daily. Qty: 60 tablet, Refills: 0    calcium carbonate (OS-CAL - DOSED IN MG OF ELEMENTAL CALCIUM) 1250 (500 CA) MG tablet Take 1 tablet (500 mg of elemental calcium total) by mouth daily with breakfast. Qty: 30 tablet, Refills: 0    feeding supplement, ENSURE ENLIVE, (ENSURE ENLIVE) LIQD Take 237 mLs by mouth 3 (three) times daily between meals. Qty: 237 mL, Refills: 12    hydrocortisone (ANUSOL-HC) 2.5 % rectal cream Apply topically 4 (four) times  daily. Qty: 30 g, Refills: 1    HYDROmorphone (DILAUDID) 4 MG tablet Take 8 mg by mouth every 4 (four) hours as needed for severe pain.    levothyroxine (SYNTHROID, LEVOTHROID) 25 MCG tablet Take 1 tablet (25 mcg total) by mouth daily before breakfast. Qty: 30 tablet, Refills: 0    magnesium oxide (MAG-OX) 400 MG tablet Take 1 tablet (400 mg total) by mouth 2 (two) times daily. Qty: 30 tablet, Refills: 0    potassium chloride (MICRO-K) 10 MEQ CR capsule Take 20 mEq by mouth 2 (two) times daily.    saccharomyces boulardii (FLORASTOR) 250 MG capsule Take 1 capsule (250 mg total) by mouth 2 (two) times daily. Qty: 20 capsule, Refills: 0    thiamine (VITAMIN B-1) 100 MG tablet Take 1 tablet (100 mg total) by mouth daily. Qty: 30 tablet, Refills: 0    traZODone (DESYREL) 25 mg TABS tablet Take 0.5 tablets (25 mg total) by mouth at bedtime as needed for sleep. Qty: 30 tablet, Refills: 0    venlafaxine (EFFEXOR) 25 MG tablet Take 1 tablet (25 mg total) by mouth 2 (two) times daily. Qty: 30 tablet, Refills: 0       Allergies  Allergen Reactions  . Iohexol Other (See Comments)    "severe burning" Patient has received Contrast in 2005 with 13 hour pre-medication, and had no reaction at that time  . Spiriva Handihaler [Tiotropium Bromide Monohydrate] Nausea And Vomiting  . Iodinated Diagnostic Agents Other (See Comments)    Feels like she is "on fire" for months  afterwards, if exposed  . Ativan [Lorazepam] Other (See Comments)    Hallucinations/confusion/"swatting flies"  . Penicillins Hives    Has had cephalosporins Has patient had a PCN reaction causing immediate rash, facial/tongue/throat swelling, SOB or lightheadedness with hypotension: Yes Has patient had a PCN reaction causing severe rash involving mucus membranes or skin necrosis: Yes Has patient had a PCN reaction that required hospitalization: Yes Has patient had a PCN reaction occurring within the last 10 years: No If all of  the above answers are "NO", then may proceed with Cephalosporin use.   Follow-up Information    HUSAIN,KARRAR, MD. Schedule an appointment as soon as possible for a visit in 1 week(s).   Specialty:  Internal Medicine Why:  Hospital follow up Contact information: [address] [place] [address] [phone]        [facility]. Schedule an appointment as soon as possible for a visit.   Why:  Hospital follow up, hemorrhoids Contact information: [address] [place] [address] [phone]            The results of significant diagnostics from this hospitalization (including imaging, microbiology, ancillary and laboratory) are listed below for reference.    Significant Diagnostic Studies: Dg Chest 2 View  Result Date: 11/06/2016 CLINICAL DATA:  56 year old with current history of diabetes, hypertension, hyperlipidemia, hypothyroidism and atrial fibrillation, presenting with fatigue and generalized malaise. EXAM: CHEST  2 VIEW COMPARISON:  09/17/2016, 08/11/2016 and earlier. FINDINGS: AP semi-erect and lateral images were obtained. Cardiac silhouette normal in size, unchanged. Thoracic aorta mildly tortuous and atherosclerotic. Hilar and mediastinal contours otherwise unremarkable. Lungs clear. Bronchovascular markings normal. Pulmonary vascularity normal. Possible left pleural effusion layering posteriorly on the lateral image. Slight thoracolumbar dextroscoliosis. IMPRESSION: 1. Possible left pleural effusion. No acute cardiopulmonary disease otherwise. 2. Mild thoracic aortic atherosclerosis. Electronically Signed   By: Hulan Saas M.D.   On: 11/06/2016 16:07   US Abdomen Limited Ruq  Result Date: 11/06/2016 CLINICAL DATA:  Elevated LFTs EXAM: US ABDOMEN LIMITED - RIGHT UPPER QUADRANT COMPARISON:  10/05/2013, 05/15/2016 FINDINGS: Gallbladder: Gallbladder is well distended. A single gallbladder polyp is again identified and stable. No  wall thickening or pericholecystic fluid is noted. Common bile duct: Diameter: 11 mm. This is relatively stable from prior CT examination. Liver: Increased in echogenicity consistent with fatty infiltration. No focal lesion is noted. IMPRESSION: Common bile duct dilatation stable over multiple previous exams. Small gallbladder polyp. Fatty liver. Electronically Signed   By: Alcide Clever M.D.   On: 11/06/2016 20:33    Microbiology: No results found for this or any previous visit (from the past 240 hour(s)).   Labs: Basic Metabolic Panel:  Recent Labs Lab 11/06/16 1731 11/06/16 2232 11/07/16 0635  NA 144  --  138  K 4.1  --  3.1*  CL 113*  --  109  CO2 19*  --  19*  GLUCOSE 58*  --  197*  BUN 7  --  6  CREATININE 0.87  --  0.69  CALCIUM 7.6*  --  6.9*  MG  --  1.2* 2.1  PHOS  --  2.6  --    Liver Function Tests:  Recent Labs Lab 11/06/16 1731 11/07/16 0635  AST 205* 98*  ALT 81* 61*  ALKPHOS 77 68  BILITOT 1.6* 0.9  PROT 4.6* 3.7*  ALBUMIN 2.0* 1.6*    Recent Labs Lab 11/06/16 1731  LIPASE 14   No results for input(s):  AMMONIA in the last 168 hours. CBC:  Recent Labs Lab 11/06/16 1731  WBC 5.4  NEUTROABS 4.2  HGB 9.3*  HCT 26.0*  MCV 87.5  PLT 194   Cardiac Enzymes: No results for input(s): CKTOTAL, CKMB, CKMBINDEX, TROPONINI in the last 168 hours. BNP: BNP (last 3 results)  Recent Labs  11/06/16 1731  BNP 71.9    ProBNP (last 3 results) No results for input(s): PROBNP in the last 8760 hours.  CBG:  Recent Labs Lab 11/06/16 2127 11/07/16 0025 11/07/16 0127 11/07/16 0401 11/07/16 0633  GLUCAP 47* 86 80 89 196*       Signed:  Keith Cancio  [facility] 11/07/2016, 1:52 PM

## 2016-11-07 NOTE — Progress Notes (Signed)
Patient refused lab draw ammonia, CMP, and ethanol.

## 2016-11-07 NOTE — Progress Notes (Signed)
Patient  Bladder scanned and showed 347 ml of urine in bladder patient refusing to get up and attempt to void states she will do it after she eats breakfast. Patient denies any discomfort. Will reassess situation after patient eats breakfast.

## 2016-11-07 NOTE — Progress Notes (Signed)
Patient discharged to home via [facility]. 

## 2016-11-07 NOTE — Discharge Instructions (Signed)
Hypoglycemia Hypoglycemia occurs when the level of sugar (glucose) in the blood is too low. Glucose is a type of sugar that provides the body's main source of energy. Certain hormones (insulin and glucagon) control the level of glucose in the blood. Insulin lowers blood glucose, and glucagon increases blood glucose. Hypoglycemia can result from having too much insulin in the bloodstream, or from not eating enough food that contains glucose. Hypoglycemia can happen in people who do or do not have diabetes. It can develop quickly, and it can be a medical emergency. What are the causes? Hypoglycemia occurs most often in people who have diabetes. If you have diabetes, hypoglycemia may be caused by:  Diabetes medicine.  Not eating enough, or not eating often enough.  Increased physical activity.  Drinking alcohol, especially when you have not eaten recently. If you do not have diabetes, hypoglycemia may be caused by:  A tumor in the pancreas. The pancreas is the organ that makes insulin.  Not eating enough, or not eating for long periods at a time (fasting).  Severe infection or illness that affects the liver, heart, or kidneys.  Certain medicines. You may also have reactive hypoglycemia. This condition causes hypoglycemia within 4 hours of eating a meal. This may occur after having stomach surgery. Sometimes, the cause of reactive hypoglycemia is not known. What increases the risk? Hypoglycemia is more likely to develop in:  People who have diabetes and take medicines to lower blood glucose.  People who abuse alcohol.  People who have a severe illness. What are the signs or symptoms? Hypoglycemia may not cause any symptoms. If you have symptoms, they may include:  Hunger.  Anxiety.  Sweating and feeling clammy.  Confusion.  Dizziness or feeling light-headed.  Sleepiness.  Nausea.  Increased heart rate.  Headache.  Blurry vision.  Seizure.  Nightmares.  Tingling  or numbness around the mouth, lips, or tongue.  A change in speech.  Decreased ability to concentrate.  A change in coordination.  Restless sleep.  Tremors or shakes.  Fainting.  Irritability. How is this diagnosed? Hypoglycemia is diagnosed with a blood test to measure your blood glucose level. This blood test is done while you are having symptoms. Your health care provider may also do a physical exam and review your medical history. If you do not have diabetes, other tests may be done to find the cause of your hypoglycemia. How is this treated? This condition can often be treated by immediately eating or drinking something that contains glucose, such as:  3-4 sugar tablets (glucose pills).  Glucose gel, 15-gram tube.  Fruit juice, 4 oz (120 mL).  Regular soda (not diet soda), 4 oz (120 mL).  Low-fat milk, 4 oz (120 mL).  Several pieces of hard candy.  Sugar or honey, 1 Tbsp. Treating Hypoglycemia If You Have Diabetes  If you are alert and able to swallow safely, follow the 15:15 rule:  Take 15 grams of a rapid-acting carbohydrate. Rapid-acting options include:  1 tube of glucose gel.  3 glucose pills.  6-8 pieces of hard candy.  4 oz (120 mL) of fruit juice.  4 oz (120 ml) of regular (not diet) soda.  Check your blood glucose 15 minutes after you take the carbohydrate.  If the repeat blood glucose level is still at or below 70 mg/dL (3.9 mmol/L), take 15 grams of a carbohydrate again.  If your blood glucose level does not increase above 70 mg/dL (3.9 mmol/L) after 3 tries, seek emergency  medical care.  After your blood glucose level returns to normal, eat a meal or a snack within 1 hour. Treating Severe Hypoglycemia  Severe hypoglycemia is when your blood glucose level is at or below 54 mg/dL (3 mmol/L). Severe hypoglycemia is an emergency. Do not wait to see if the symptoms will go away. Get medical help right away. Call your local emergency services (911  in the [place] Do not drive yourself to the hospital. If you have severe hypoglycemia and you cannot eat or drink, you may need an injection of glucagon. A family member or close friend should learn how to check your blood glucose and how to give you a glucagon injection. Ask your health care provider if you need to have an emergency glucagon injection kit available. Severe hypoglycemia may need to be treated in a hospital. The treatment may include getting glucose through an IV tube. You may also need treatment for the cause of your hypoglycemia. Follow these instructions at home: General instructions  Avoid any diets that cause you to not eat enough food. Talk with your health care provider before you start any new diet.  Take over-the-counter and prescription medicines only as told by your health care provider.  Limit alcohol intake to no more than 1 drink per day for nonpregnant women and 2 drinks per day for men. One drink equals 12 oz of beer, 5 oz of wine, or 1 oz of hard liquor.  Keep all follow-up visits as told by your health care provider. This is important. If You Have Diabetes:   Make sure you know the symptoms of hypoglycemia.  Always have a rapid-acting carbohydrate snack with you to treat low blood sugar.  Follow your diabetes management plan, as told by your health care provider. Make sure you:  Take your medicines as directed.  Follow your exercise plan.  Follow your meal plan. Eat on time, and do not skip meals.  Check your blood glucose as often as directed. Make sure to check your blood glucose before and after exercise. If you exercise longer or in a different way than usual, check your blood glucose more often.  Follow your sick day plan whenever you cannot eat or drink normally. Make this plan in advance with your health care provider.  Share your diabetes management plan with people in your workplace, school, and household.  Check your urine for ketones when  you are ill and as told by your health care provider.  Carry a medical alert card or wear medical alert jewelry. If You Have Reactive Hypoglycemia or Low Blood Sugar From Other Causes:  Monitor your blood glucose as told by your health care provider.  Follow instructions from your health care provider about eating or drinking restrictions. Contact a health care provider if:  You have problems keeping your blood glucose in your target range.  You have frequent episodes of hypoglycemia. Get help right away if:  You continue to have hypoglycemia symptoms after eating or drinking something containing glucose.  Your blood glucose is at or below 54 mg/dL (3 mmol/L).  You have a seizure.  You faint. These symptoms may represent a serious problem that is an emergency. Do not wait to see if the symptoms will go away. Get medical help right away. Call your local emergency services (911 in the [place] Do not drive yourself to the hospital.  This information is not intended to replace advice given to you by your health care provider. Make sure you  discuss any questions you have with your health care provider. Document Released: 09/17/2005 Document Revised: 02/29/2016 Document Reviewed: 10/21/2015 Elsevier Interactive Patient Education  2017 [employer] Malnutrition Introduction Malnutrition is any condition in which nutrition is poor. There are many forms of malnutrition. A common form is having too little of one kind of nutrient (nutritional deficiency). Nutrients include proteins, minerals, carbohydrates, fats, and vitamins. They provide the body with energy and keep the body working normally. Malnutrition ranges from mild to severe. The condition affects the body's defense system (immune system). Because of this, people who are malnourished are more likely to develop health problems and get sick. What are the causes? Causes of malnutrition include:  Eating an unbalanced diet.  Eating too  much of certain foods.  Eating too little.  Conditions that decrease the body's ability to use nutrients. What increases the risk? Risk factors include:  Pregnancy and lactation. Women who are pregnant may become malnourished if they do not increase their nutrient intake. They are also susceptible to folic acid deficiency.  Increasing age. The body's ability to absorb nutrients decreases with age. This can contribute to iron, calcium, and vitamin D deficiencies.  Alcohol or drug dependency. Addiction often leads to a lifestyle in which proper nourishment is ignored. Dependency can also hurt the metabolism and the body's ability to absorb nutrients. Alcoholism is a major cause of thiamine deficiency and can lead to deficiencies of magnesium, zinc, and other vitamins.  Eating disorders, such as anorexia nervosa. People with these disorders may eat too little or too much.  Chewing or swallowing problems. People with these disorders may not eat enough.  Certain diseases, including:  Long-lasting (chronic) diseases. Chronic diseases tend to affect the absorption of calcium, iron, and vitamins B12, A, D, E, and K.  Liver disease. Liver disease affects the storage of vitamins A and B12. It also interferes with the metabolism of protein and energy sources.  Kidney disease. Kidney disease may cause deficiencies of protein, iron, and vitamin D.  Cancer or AIDS. These diseases can cause a loss of appetite.  Cystic fibrosis. This disease can make it difficult for the body to absorb nutrients.  Certain diets, including.  The vegetarian diet. Vegetarians are at risk for iron deficiency.  The vegan diet. Vegans are susceptible to vitamin B12, calcium, iron, vitamin D, and zinc deficiencies.  The fruitarian diet. This diet can be deficient in protein, sodium, and many micronutrients.  Many commercial "fad" diets, including those that claim to enhance well-being and reduce weight.  Very low  calorie diets.  Low income. People with a low income may have trouble paying for nutritious foods. What are the signs or symptoms? Signs and symptoms depend on the kind of malnutrition you have. Common symptoms include:  Fatigue.  Weakness.  Dizziness.  Fainting  Weight loss.  Poor immune response.  Lack of menstruation.  Hair loss.  Poor memory. How is this diagnosed? Malnutrition may be diagnosed by:  A medical history.  A dietary history.  A physical exam. This may include a measurement of your body mass index (BMI).  Blood tests. How is this treated? Treatments vary depending on the cause of the malnutrition. Common treatments include:  Dietary changes.  Dietary supplements, such as vitamins and minerals.  Treatment of any underlying conditions. Follow these instructions at home:  Eat a balanced diet.  Take dietary supplements as directed by your health care provider.  Exercise regularly. Exercising can improve appetite.  Keep all follow-up  visits as directed by your health care provider. This is important. How is this prevented? Eating a well-balanced diet helps to prevent most forms of malnutrition. Contact a health care provider if:  You have increased weakness or fatigue.  You faint.  You stop menstruating.  You have rapid hair loss.  You have unexpected weight loss. This information is not intended to replace advice given to you by your health care provider. Make sure you discuss any questions you have with your health care provider. Document Released: 08/03/2005 Document Revised: 02/23/2016 Document Reviewed: 05/14/2014  2017 Elsevier

## 2016-11-07 NOTE — Progress Notes (Signed)
Discharge instructions reviewed with patient utilizing teach back method no questions at this time. [facility] notified for transportation home.

## 2016-11-07 NOTE — Progress Notes (Signed)
Contact information for pt caregiver inaccurate in system, contacted caregiver employer with pt permission and received correct contact number for Lorna Few, pt caregiver, [phone] to verify that caregiver can make sure pt house door is unlocked when [facility] transports pt home today.  Caregiver verified that can have door unlocked within the hour and plans to go see pt at home around 4 pm today.  Notified pt that Bonita Quin would make sure door unlocked and will come by to check on her this afternoon.  Pt acknowledged.

## 2016-11-07 NOTE — ED Notes (Signed)
Report called to Observation Unit, the delay was explained to RN

## 2016-11-09 LAB — URINE CULTURE: Culture: >=100000 — AB

## 2016-11-13 DIAGNOSIS — L89312 Pressure ulcer of right buttock, stage 2: Secondary | ICD-10-CM | POA: Diagnosis not present

## 2016-11-13 DIAGNOSIS — E1140 Type 2 diabetes mellitus with diabetic neuropathy, unspecified: Secondary | ICD-10-CM | POA: Diagnosis not present

## 2016-11-13 DIAGNOSIS — I82432 Acute embolism and thrombosis of left popliteal vein: Secondary | ICD-10-CM | POA: Diagnosis not present

## 2016-11-13 DIAGNOSIS — I82492 Acute embolism and thrombosis of other specified deep vein of left lower extremity: Secondary | ICD-10-CM | POA: Diagnosis not present

## 2016-11-13 DIAGNOSIS — I82442 Acute embolism and thrombosis of left tibial vein: Secondary | ICD-10-CM | POA: Diagnosis not present

## 2016-11-13 DIAGNOSIS — I82412 Acute embolism and thrombosis of left femoral vein: Secondary | ICD-10-CM | POA: Diagnosis not present

## 2016-11-20 ENCOUNTER — Emergency Department (HOSPITAL_COMMUNITY): Payer: Medicare Other

## 2016-11-20 ENCOUNTER — Encounter (HOSPITAL_COMMUNITY): Payer: Self-pay | Admitting: Emergency Medicine

## 2016-11-20 ENCOUNTER — Inpatient Hospital Stay (HOSPITAL_COMMUNITY)
Admission: EM | Admit: 2016-11-20 | Discharge: 2016-11-23 | DRG: 871 | Disposition: A | Discharge status: Hospice | Payer: Medicare Other | Attending: Internal Medicine | Admitting: Internal Medicine

## 2016-11-20 DIAGNOSIS — E785 Hyperlipidemia, unspecified: Secondary | ICD-10-CM | POA: Diagnosis present

## 2016-11-20 DIAGNOSIS — A4181 Sepsis due to Enterococcus: Principal | ICD-10-CM | POA: Diagnosis present

## 2016-11-20 DIAGNOSIS — Z515 Encounter for palliative care: Secondary | ICD-10-CM | POA: Diagnosis not present

## 2016-11-20 DIAGNOSIS — Z86718 Personal history of other venous thrombosis and embolism: Secondary | ICD-10-CM | POA: Diagnosis not present

## 2016-11-20 DIAGNOSIS — D696 Thrombocytopenia, unspecified: Secondary | ICD-10-CM | POA: Diagnosis present

## 2016-11-20 DIAGNOSIS — D649 Anemia, unspecified: Secondary | ICD-10-CM | POA: Diagnosis present

## 2016-11-20 DIAGNOSIS — E039 Hypothyroidism, unspecified: Secondary | ICD-10-CM | POA: Diagnosis present

## 2016-11-20 DIAGNOSIS — E11649 Type 2 diabetes mellitus with hypoglycemia without coma: Secondary | ICD-10-CM | POA: Diagnosis present

## 2016-11-20 DIAGNOSIS — Z833 Family history of diabetes mellitus: Secondary | ICD-10-CM

## 2016-11-20 DIAGNOSIS — L89152 Pressure ulcer of sacral region, stage 2: Secondary | ICD-10-CM | POA: Diagnosis present

## 2016-11-20 DIAGNOSIS — R6520 Severe sepsis without septic shock: Secondary | ICD-10-CM

## 2016-11-20 DIAGNOSIS — E870 Hyperosmolality and hypernatremia: Secondary | ICD-10-CM | POA: Diagnosis present

## 2016-11-20 DIAGNOSIS — R51 Headache: Secondary | ICD-10-CM | POA: Diagnosis not present

## 2016-11-20 DIAGNOSIS — Z880 Allergy status to penicillin: Secondary | ICD-10-CM

## 2016-11-20 DIAGNOSIS — D509 Iron deficiency anemia, unspecified: Secondary | ICD-10-CM | POA: Diagnosis not present

## 2016-11-20 DIAGNOSIS — I4891 Unspecified atrial fibrillation: Secondary | ICD-10-CM | POA: Diagnosis not present

## 2016-11-20 DIAGNOSIS — R627 Adult failure to thrive: Secondary | ICD-10-CM | POA: Diagnosis present

## 2016-11-20 DIAGNOSIS — I10 Essential (primary) hypertension: Secondary | ICD-10-CM | POA: Diagnosis present

## 2016-11-20 DIAGNOSIS — R531 Weakness: Secondary | ICD-10-CM | POA: Diagnosis not present

## 2016-11-20 DIAGNOSIS — R64 Cachexia: Secondary | ICD-10-CM | POA: Diagnosis present

## 2016-11-20 DIAGNOSIS — R0600 Dyspnea, unspecified: Secondary | ICD-10-CM | POA: Diagnosis not present

## 2016-11-20 DIAGNOSIS — D638 Anemia in other chronic diseases classified elsewhere: Secondary | ICD-10-CM | POA: Diagnosis present

## 2016-11-20 DIAGNOSIS — R0602 Shortness of breath: Secondary | ICD-10-CM | POA: Diagnosis not present

## 2016-11-20 DIAGNOSIS — R579 Shock, unspecified: Secondary | ICD-10-CM | POA: Diagnosis not present

## 2016-11-20 DIAGNOSIS — E878 Other disorders of electrolyte and fluid balance, not elsewhere classified: Secondary | ICD-10-CM | POA: Diagnosis present

## 2016-11-20 DIAGNOSIS — R404 Transient alteration of awareness: Secondary | ICD-10-CM | POA: Diagnosis not present

## 2016-11-20 DIAGNOSIS — K5090 Crohn's disease, unspecified, without complications: Secondary | ICD-10-CM | POA: Diagnosis present

## 2016-11-20 DIAGNOSIS — F17210 Nicotine dependence, cigarettes, uncomplicated: Secondary | ICD-10-CM | POA: Diagnosis present

## 2016-11-20 DIAGNOSIS — K567 Ileus, unspecified: Secondary | ICD-10-CM | POA: Diagnosis not present

## 2016-11-20 DIAGNOSIS — N390 Urinary tract infection, site not specified: Secondary | ICD-10-CM | POA: Diagnosis present

## 2016-11-20 DIAGNOSIS — Z86711 Personal history of pulmonary embolism: Secondary | ICD-10-CM | POA: Diagnosis not present

## 2016-11-20 DIAGNOSIS — E46 Unspecified protein-calorie malnutrition: Secondary | ICD-10-CM | POA: Diagnosis not present

## 2016-11-20 DIAGNOSIS — D6869 Other thrombophilia: Secondary | ICD-10-CM | POA: Diagnosis not present

## 2016-11-20 DIAGNOSIS — Z7401 Bed confinement status: Secondary | ICD-10-CM

## 2016-11-20 DIAGNOSIS — R571 Hypovolemic shock: Secondary | ICD-10-CM | POA: Diagnosis present

## 2016-11-20 DIAGNOSIS — I480 Paroxysmal atrial fibrillation: Secondary | ICD-10-CM | POA: Diagnosis present

## 2016-11-20 DIAGNOSIS — R7989 Other specified abnormal findings of blood chemistry: Secondary | ICD-10-CM | POA: Diagnosis present

## 2016-11-20 DIAGNOSIS — A419 Sepsis, unspecified organism: Secondary | ICD-10-CM | POA: Diagnosis not present

## 2016-11-20 DIAGNOSIS — E872 Acidosis, unspecified: Secondary | ICD-10-CM | POA: Diagnosis present

## 2016-11-20 DIAGNOSIS — N179 Acute kidney failure, unspecified: Secondary | ICD-10-CM | POA: Diagnosis present

## 2016-11-20 DIAGNOSIS — F419 Anxiety disorder, unspecified: Secondary | ICD-10-CM | POA: Diagnosis present

## 2016-11-20 DIAGNOSIS — K760 Fatty (change of) liver, not elsewhere classified: Secondary | ICD-10-CM | POA: Diagnosis present

## 2016-11-20 DIAGNOSIS — Z803 Family history of malignant neoplasm of breast: Secondary | ICD-10-CM

## 2016-11-20 DIAGNOSIS — G8929 Other chronic pain: Secondary | ICD-10-CM | POA: Diagnosis present

## 2016-11-20 DIAGNOSIS — H409 Unspecified glaucoma: Secondary | ICD-10-CM | POA: Diagnosis present

## 2016-11-20 DIAGNOSIS — E43 Unspecified severe protein-calorie malnutrition: Secondary | ICD-10-CM | POA: Diagnosis present

## 2016-11-20 DIAGNOSIS — K219 Gastro-esophageal reflux disease without esophagitis: Secondary | ICD-10-CM | POA: Diagnosis present

## 2016-11-20 DIAGNOSIS — E876 Hypokalemia: Secondary | ICD-10-CM | POA: Diagnosis present

## 2016-11-20 DIAGNOSIS — Z66 Do not resuscitate: Secondary | ICD-10-CM | POA: Diagnosis present

## 2016-11-20 DIAGNOSIS — Z681 Body mass index (BMI) 19 or less, adult: Secondary | ICD-10-CM | POA: Diagnosis not present

## 2016-11-20 DIAGNOSIS — F339 Major depressive disorder, recurrent, unspecified: Secondary | ICD-10-CM | POA: Diagnosis not present

## 2016-11-20 DIAGNOSIS — E1142 Type 2 diabetes mellitus with diabetic polyneuropathy: Secondary | ICD-10-CM | POA: Diagnosis present

## 2016-11-20 DIAGNOSIS — E860 Dehydration: Secondary | ICD-10-CM | POA: Diagnosis present

## 2016-11-20 DIAGNOSIS — G894 Chronic pain syndrome: Secondary | ICD-10-CM | POA: Diagnosis not present

## 2016-11-20 DIAGNOSIS — R109 Unspecified abdominal pain: Secondary | ICD-10-CM | POA: Diagnosis present

## 2016-11-20 DIAGNOSIS — Z888 Allergy status to other drugs, medicaments and biological substances status: Secondary | ICD-10-CM

## 2016-11-20 DIAGNOSIS — E1140 Type 2 diabetes mellitus with diabetic neuropathy, unspecified: Secondary | ICD-10-CM | POA: Diagnosis present

## 2016-11-20 DIAGNOSIS — Z7901 Long term (current) use of anticoagulants: Secondary | ICD-10-CM

## 2016-11-20 DIAGNOSIS — Z8249 Family history of ischemic heart disease and other diseases of the circulatory system: Secondary | ICD-10-CM

## 2016-11-20 DIAGNOSIS — R0902 Hypoxemia: Secondary | ICD-10-CM | POA: Diagnosis present

## 2016-11-20 DIAGNOSIS — B961 Klebsiella pneumoniae [K. pneumoniae] as the cause of diseases classified elsewhere: Secondary | ICD-10-CM | POA: Diagnosis present

## 2016-11-20 DIAGNOSIS — R680 Hypothermia, not associated with low environmental temperature: Secondary | ICD-10-CM | POA: Diagnosis present

## 2016-11-20 DIAGNOSIS — Z91041 Radiographic dye allergy status: Secondary | ICD-10-CM

## 2016-11-20 DIAGNOSIS — Z7189 Other specified counseling: Secondary | ICD-10-CM

## 2016-11-20 LAB — COMPREHENSIVE METABOLIC PANEL
ALT: 98 U/L — AB (ref 14–54)
AST: 239 U/L — AB (ref 15–41)
Albumin: 1.8 g/dL — ABNORMAL LOW (ref 3.5–5.0)
Alkaline Phosphatase: 129 U/L — ABNORMAL HIGH (ref 38–126)
BUN: 15 mg/dL (ref 6–20)
CHLORIDE: 120 mmol/L — AB (ref 101–111)
CREATININE: 1.22 mg/dL — AB (ref 0.44–1.00)
Calcium: 7.9 mg/dL — ABNORMAL LOW (ref 8.9–10.3)
GFR, EST AFRICAN AMERICAN: 55 mL/min — AB (ref >60)
GFR, EST NON AFRICAN AMERICAN: 48 mL/min — AB (ref >60)
Glucose, Bld: 138 mg/dL — ABNORMAL HIGH (ref 65–99)
POTASSIUM: 4.1 mmol/L (ref 3.5–5.1)
Sodium: 151 mmol/L — ABNORMAL HIGH (ref 135–145)
Total Bilirubin: 1.8 mg/dL — ABNORMAL HIGH (ref 0.3–1.2)
Total Protein: 4.1 g/dL — ABNORMAL LOW (ref 6.5–8.1)

## 2016-11-20 LAB — CBC WITH DIFFERENTIAL/PLATELET
BASOS ABS: 0 10*3/uL (ref 0.0–0.1)
BASOS PCT: 0 %
Basophils Absolute: 0 10*3/uL (ref 0.0–0.1)
Basophils Absolute: 0 10*3/uL (ref 0.0–0.1)
Basophils Relative: 0 %
Basophils Relative: 0 %
EOS ABS: 0 10*3/uL (ref 0.0–0.7)
EOS ABS: 0 10*3/uL (ref 0.0–0.7)
EOS PCT: 0 %
Eosinophils Absolute: 0 10*3/uL (ref 0.0–0.7)
Eosinophils Relative: 0 %
Eosinophils Relative: 0 %
HCT: 24.7 % — ABNORMAL LOW (ref 36.0–46.0)
HCT: 22.7 % — ABNORMAL LOW (ref 36.0–46.0)
HCT: 16.8 % — ABNORMAL LOW (ref 36.0–46.0)
Hemoglobin: 7.5 g/dL — ABNORMAL LOW (ref 12.0–15.0)
Hemoglobin: 7 g/dL — ABNORMAL LOW (ref 12.0–15.0)
Hemoglobin: 5.4 g/dL — CL (ref 12.0–15.0)
LYMPHS ABS: 0.7 10*3/uL (ref 0.7–4.0)
LYMPHS PCT: 11 %
LYMPHS PCT: 9 %
Lymphocytes Relative: 26 %
Lymphs Abs: 1.8 10*3/uL (ref 0.7–4.0)
Lymphs Abs: 0.8 10*3/uL (ref 0.7–4.0)
MCH: 31.6 pg (ref 26.0–34.0)
MCH: 31.5 pg (ref 26.0–34.0)
MCH: 31.8 pg (ref 26.0–34.0)
MCHC: 30.4 g/dL (ref 30.0–36.0)
MCHC: 30.8 g/dL (ref 30.0–36.0)
MCHC: 32.1 g/dL (ref 30.0–36.0)
MCV: 104.2 fL — AB (ref 78.0–100.0)
MCV: 102.3 fL — ABNORMAL HIGH (ref 78.0–100.0)
MCV: 98.8 fL (ref 78.0–100.0)
MONO ABS: 0.3 10*3/uL (ref 0.1–1.0)
MONO ABS: 0.4 10*3/uL (ref 0.1–1.0)
Monocytes Absolute: 0.4 10*3/uL (ref 0.1–1.0)
Monocytes Relative: 6 %
Monocytes Relative: 4 %
Monocytes Relative: 5 %
NEUTROS PCT: 68 %
NEUTROS PCT: 85 %
NEUTROS PCT: 86 %
NRBC: 2 /100{WBCs} — AB
Neutro Abs: 4.7 10*3/uL (ref 1.7–7.7)
Neutro Abs: 6.4 10*3/uL (ref 1.7–7.7)
Neutro Abs: 6.4 10*3/uL (ref 1.7–7.7)
PLATELETS: 151 10*3/uL (ref 150–400)
PLATELETS: 96 10*3/uL — AB (ref 150–400)
Platelets: 18 10*3/uL — CL (ref 150–400)
RBC: 2.37 MIL/uL — AB (ref 3.87–5.11)
RBC: 2.22 MIL/uL — ABNORMAL LOW (ref 3.87–5.11)
RBC: 1.7 MIL/uL — AB (ref 3.87–5.11)
RDW: 20.6 % — ABNORMAL HIGH (ref 11.5–15.5)
RDW: 21.2 % — AB (ref 11.5–15.5)
RDW: 22.2 % — AB (ref 11.5–15.5)
WBC: 6.9 10*3/uL (ref 4.0–10.5)
WBC: 7.5 10*3/uL (ref 4.0–10.5)
WBC: 7.5 10*3/uL (ref 4.0–10.5)
nRBC: 2 /100 WBC — ABNORMAL HIGH

## 2016-11-20 LAB — BASIC METABOLIC PANEL
ANION GAP: 24 — AB (ref 5–15)
BUN: 13 mg/dL (ref 6–20)
CALCIUM: 6.8 mg/dL — AB (ref 8.9–10.3)
CO2: 9 mmol/L — ABNORMAL LOW (ref 22–32)
Chloride: 117 mmol/L — ABNORMAL HIGH (ref 101–111)
Creatinine, Ser: 1.05 mg/dL — ABNORMAL HIGH (ref 0.44–1.00)
GFR calc Af Amer: >60 mL/min (ref >60)
GFR, EST NON AFRICAN AMERICAN: 57 mL/min — AB (ref >60)
GLUCOSE: 258 mg/dL — AB (ref 65–99)
POTASSIUM: 3.6 mmol/L (ref 3.5–5.1)
SODIUM: 150 mmol/L — AB (ref 135–145)

## 2016-11-20 LAB — BLOOD GAS, ARTERIAL
DRAWN BY: 295031
O2 Content: 5 L/min
O2 Saturation: 99.1 %
PO2 ART: 230 mmHg — AB (ref 83.0–108.0)
Patient temperature: 96.1
pH, Arterial: 7.282 — ABNORMAL LOW (ref 7.350–7.450)

## 2016-11-20 LAB — URINALYSIS, ROUTINE W REFLEX MICROSCOPIC
BILIRUBIN URINE: NEGATIVE
GLUCOSE, UA: NEGATIVE mg/dL
Hgb urine dipstick: NEGATIVE
KETONES UR: 20 mg/dL — AB
NITRITE: NEGATIVE
PH: 5 (ref 5.0–8.0)
Protein, ur: NEGATIVE mg/dL
Specific Gravity, Urine: 1.025 (ref 1.005–1.030)

## 2016-11-20 LAB — CBG MONITORING, ED: Glucose-Capillary: 93 mg/dL (ref 65–99)

## 2016-11-20 LAB — I-STAT TROPONIN, ED: Troponin i, poc: 0.19 ng/mL (ref 0.00–0.08)

## 2016-11-20 LAB — MRSA PCR SCREENING: MRSA by PCR: NEGATIVE

## 2016-11-20 LAB — LACTIC ACID, PLASMA: LACTIC ACID, VENOUS: 9.4 mmol/L — AB (ref 0.5–1.9)

## 2016-11-20 LAB — I-STAT CG4 LACTIC ACID, ED
LACTIC ACID, VENOUS: 9.31 mmol/L — AB (ref 0.5–1.9)
Lactic Acid, Venous: 9.86 mmol/L (ref 0.5–1.9)

## 2016-11-20 LAB — POC OCCULT BLOOD, ED: FECAL OCCULT BLD: POSITIVE — AB

## 2016-11-20 LAB — TROPONIN I: Troponin I: 0.12 ng/mL (ref <0.03)

## 2016-11-20 MED ORDER — SODIUM BICARBONATE 8.4 % IV SOLN
INTRAVENOUS | Status: AC
  Administered 2016-11-20: 50 meq via INTRAVENOUS
  Filled 2016-11-20: qty 50

## 2016-11-20 MED ORDER — ACETAMINOPHEN 325 MG PO TABS: 650.0000 mg | ORAL_TABLET | Freq: Four times a day (QID) | ORAL | Status: DC | PRN

## 2016-11-20 MED ORDER — ONDANSETRON HCL 4 MG/2ML IJ SOLN: 4.0000 mg | Freq: Four times a day (QID) | INTRAMUSCULAR | Status: DC | PRN

## 2016-11-20 MED ORDER — VANCOMYCIN HCL IN DEXTROSE 750-5 MG/150ML-% IV SOLN
750.0000 mg | INTRAVENOUS | Status: DC
  Administered 2016-11-21: 750 mg via INTRAVENOUS
  Filled 2016-11-20 (×2): qty 150

## 2016-11-20 MED ORDER — DEXTROSE 5 % IV SOLN
2.0000 g | Freq: Once | INTRAVENOUS | Status: AC
  Administered 2016-11-20: 2 g via INTRAVENOUS
  Filled 2016-11-20: qty 2

## 2016-11-20 MED ORDER — SODIUM CHLORIDE 0.9 % IV SOLN
INTRAVENOUS | Status: DC
  Administered 2016-11-20: 23:00:00 via INTRAVENOUS

## 2016-11-20 MED ORDER — SODIUM BICARBONATE 8.4 % IV SOLN
50.0000 meq | Freq: Once | INTRAVENOUS | Status: AC
  Administered 2016-11-20: 50 meq via INTRAVENOUS

## 2016-11-20 MED ORDER — SODIUM CHLORIDE 0.9 % IV SOLN: 250.0000 mL | INTRAVENOUS | Status: DC | PRN

## 2016-11-20 MED ORDER — SODIUM CHLORIDE 0.9 % IV BOLUS (SEPSIS)
1000.0000 mL | Freq: Once | INTRAVENOUS | Status: AC
  Administered 2016-11-20: 1000 mL via INTRAVENOUS

## 2016-11-20 MED ORDER — SODIUM CHLORIDE 0.9% FLUSH
3.0000 mL | Freq: Two times a day (BID) | INTRAVENOUS | Status: DC
  Administered 2016-11-20 – 2016-11-23 (×6): 3 mL via INTRAVENOUS

## 2016-11-20 MED ORDER — SODIUM BICARBONATE 8.4 % IV SOLN
INTRAVENOUS | Status: DC
  Administered 2016-11-20 – 2016-11-22 (×4): via INTRAVENOUS
  Filled 2016-11-20 (×4): qty 150

## 2016-11-20 MED ORDER — LIDOCAINE HCL 1 % IJ SOLN
INTRAMUSCULAR | Status: AC
  Administered 2016-11-20: 20 mL
  Filled 2016-11-20: qty 20

## 2016-11-20 MED ORDER — HYDROMORPHONE HCL 1 MG/ML IJ SOLN
0.5000 mg | INTRAMUSCULAR | Status: DC | PRN
  Administered 2016-11-20 – 2016-11-22 (×8): 0.5 mg via INTRAVENOUS
  Filled 2016-11-20 (×8): qty 0.5

## 2016-11-20 MED ORDER — SODIUM CHLORIDE 0.9 % IV SOLN
Freq: Once | INTRAVENOUS | Status: AC
  Administered 2016-11-20: 21:00:00 via INTRAVENOUS

## 2016-11-20 MED ORDER — FENTANYL CITRATE (PF) 100 MCG/2ML IJ SOLN
12.5000 ug | Freq: Once | INTRAMUSCULAR | Status: AC
  Administered 2016-11-20: 12.5 ug via INTRAVENOUS
  Filled 2016-11-20: qty 2

## 2016-11-20 MED ORDER — SODIUM CHLORIDE 0.9 % IV BOLUS (SEPSIS)
1000.0000 mL | Freq: Once | INTRAVENOUS | Status: AC
  Administered 2016-11-20 (×2): 1000 mL via INTRAVENOUS

## 2016-11-20 MED ORDER — SODIUM CHLORIDE 0.9 % IV SOLN: Freq: Once | INTRAVENOUS | Status: DC

## 2016-11-20 MED ORDER — VANCOMYCIN HCL IN DEXTROSE 1-5 GM/200ML-% IV SOLN
1000.0000 mg | Freq: Once | INTRAVENOUS | Status: AC
  Administered 2016-11-20: 1000 mg via INTRAVENOUS
  Filled 2016-11-20: qty 200

## 2016-11-20 MED ORDER — FAMOTIDINE IN NACL 20-0.9 MG/50ML-% IV SOLN
20.0000 mg | INTRAVENOUS | Status: DC
  Administered 2016-11-20 – 2016-11-21 (×2): 20 mg via INTRAVENOUS
  Filled 2016-11-20 (×3): qty 50

## 2016-11-20 MED ORDER — SODIUM CHLORIDE 0.9 % IV SOLN
Freq: Once | INTRAVENOUS | Status: AC
  Administered 2016-11-21: 02:00:00 via INTRAVENOUS

## 2016-11-20 MED ORDER — DEXTROSE 5 % IV SOLN
1.0000 g | INTRAVENOUS | Status: DC
  Administered 2016-11-21: 1 g via INTRAVENOUS
  Filled 2016-11-20 (×2): qty 1

## 2016-11-20 MED ORDER — ONDANSETRON HCL 4 MG PO TABS: 4.0000 mg | ORAL_TABLET | Freq: Four times a day (QID) | ORAL | Status: DC | PRN

## 2016-11-20 MED ORDER — ACETAMINOPHEN 650 MG RE SUPP: 650.0000 mg | Freq: Four times a day (QID) | RECTAL | Status: DC | PRN

## 2016-11-20 NOTE — ED Provider Notes (Signed)
[facility]-EMERGENCY DEPT Provider Note   CSN: [id number] Arrival date & time: 11/20/16  1337     History   Chief Complaint Chief Complaint  Patient presents with  . Failure To Thrive    HPI [name] is a 56 y.o. female.  56 y.o. female with PMH of DM with diabetic neuropathies, HTN, HPL, hypothyroidism, paroxysmal atrial fibrillation diagnosed 08/2016, DVT in June 2017 on apixaban, GERD, anemia, urosepsis, Chron, chronic pain presents to the ED today by EMS from home with complaint of generalized pain. Patient lives with her son at home and has a caregiver. They called for her to come to the ED for failure to thrive.  Patient unable to quantify qualify or describe feelings. States that she has side pain. She is asking for her chronic Percocet. Patient states she's had one episode of emesis and diarrhea over the past week. She endorses abdominal pain started approximately one week ago. Family states the patient has had a poor appetite for 2 weeks. She was recently hospitalized  beginning of this month and released on 2/7 for her hypoglycemia. I spoke with patient's caregiver who states that patient has been laying in bed since she was discharged. She is able to ambulate at home. She has poor care from her family. States that she is not eating and continues to be hypoglycemic. When EMS arrived a patient's blood sugar was 68. She is also noted to be tachycardic. Patient with new onset A. fib diagnosed in December. The patient on eliquis for DVT. She has not seen a cardiologist for her A. fib. The patient is a poor historian. She denies any chest pain or shortness of breath. She denies any urinary symptoms. She denies any fevers. The caregiver states that they are trying to find patient a nursing home. Patient is screaming out in generalized pain. She denies any fever, chills, headache, vision changes, lightheadedness, dizziness, chest pain, shortness of breath, urinary symptoms, paresthesias. Spoke  with the caregiver who states that her son will not be coming to the ED. Caregiver also reports that she does not take her medication as she is supposed to. The caregivers there she asked her medication however she does not take when she is not there.      Past Medical History:  Diagnosis Date  . Anxiety   . Arthritis    "a little bit; all over" (05/01/2016)  . Asthma   . Asthma   . C. difficile colitis   . Chronic lower back pain   . Crohn disease (HCC)   . Diabetes mellitus without complication (HCC)   . DVT (deep venous thrombosis) (HCC) ~ 03/2016   LLE  . Elevated lactic acid level   . GERD (gastroesophageal reflux disease)   . GI bleed 10/18/2016  . Glaucoma   . HLD (hyperlipidemia)   . Hypertension   . Hypothyroidism   . Neuropathy (HCC)   . Pancreatitis   . Pulmonary embolus (HCC) 05/24/2014  . Thrombocytopenia (HCC)   . Tobacco abuse     Patient Active Problem List   Diagnosis Date Noted  . Hypoglycemia 11/06/2016  . Transaminitis 11/06/2016  . Bleeding hemorrhoid 10/22/2016  . GIB (gastrointestinal bleeding) 10/18/2016  . Sacral decubitus ulcer, stage II 08/12/2016  . Altered mental status 08/11/2016  . Protein calorie malnutrition (HCC) 08/11/2016  . Diarrhea 08/11/2016  . Delirium 06/26/2016  . Pressure injury of skin 06/26/2016  . Abdominal pain, generalized   . Abdominal pain   . AKI (  acute kidney injury) (HCC)   . Sepsis (HCC)   . UTI (urinary tract infection) 02/28/2016  . 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 Excoriated rash-medial thighs 02/28/2016  . DVT (deep venous thrombosis), unspecified laterality 01/29/2016  . Bilateral tibial fractures   . Hypothyroidism   . GERD (gastroesophageal reflux disease)   . HLD (hyperlipidemia)   . Enteritis due to Clostridium difficile 03/23/2015  . Thrombocytopenia (HCC) 03/21/2015  . Weakness of back 03/21/2015  . Chronic pain 02/28/2015  . Severe protein-calorie malnutrition (HCC) 12/30/2014  . Hepatic  steatosis 12/29/2014  . Chronic diarrhea of unknown origin 12/29/2014  . Elevated lactic acid level   . Dehydration 07/26/2014  . Arterial hypotension 07/26/2014  . History of pulmonary embolus (PE) 05/24/2014  . Physical deconditioning 05/14/2014  . Nicotine abuse 05/08/2014  . Anemia 10/15/2012  . Metabolic acidosis 10/14/2012  . Diabetes mellitus with complication (HCC) 05/12/2007  . Anxiety state 05/12/2007  . Depression 05/12/2007  . Peripheral neuropathy (HCC) 05/12/2007  . HTN (hypertension) 05/12/2007  . ALLERGIC RHINITIS 05/12/2007  . Asthma 05/12/2007  . PANCREATITIS, HX OF 05/12/2007    Past Surgical History:  Procedure Laterality Date  . CESAREAN SECTION  1996  . COLONOSCOPY N/A 03/02/2015   Procedure: COLONOSCOPY;  Surgeon: Bernette Redbird, MD;  Location: [facility] ENDOSCOPY;  Service: Endoscopy;  Laterality: N/A;  . ESOPHAGOGASTRODUODENOSCOPY N/A 05/21/2014   Procedure: ESOPHAGOGASTRODUODENOSCOPY (EGD);  Surgeon: Petra Kuba, MD;  Location: [facility] ENDOSCOPY;  Service: Endoscopy;  Laterality: N/A;  . ESOPHAGOGASTRODUODENOSCOPY N/A 03/02/2015   Procedure: ESOPHAGOGASTRODUODENOSCOPY (EGD);  Surgeon: Bernette Redbird, MD;  Location: [facility] ENDOSCOPY;  Service: Endoscopy;  Laterality: N/A;    OB History    No data available       Home Medications    Prior to Admission medications   Medication Sig Start Date End Date Taking? Authorizing Provider  apixaban (ELIQUIS) 5 MG TABS tablet Take 1 tablet (5 mg total) by mouth 2 (two) times daily. 05/02/16   Richarda Overlie, MD  calcium carbonate (OS-CAL - DOSED IN MG OF ELEMENTAL CALCIUM) 1250 (500 CA) MG tablet Take 1 tablet (500 mg of elemental calcium total) by mouth daily with breakfast. 03/04/15   Alison Murray, MD  feeding supplement, ENSURE ENLIVE, (ENSURE ENLIVE) LIQD Take 237 mLs by mouth 3 (three) times daily between meals. 10/22/16   Nishant Dhungel, MD  hydrocortisone (ANUSOL-HC) 2.5 % rectal cream Apply topically 4 (four) times  daily. Patient taking differently: Apply 1 application topically 4 (four) times daily.  10/22/16   Nishant Dhungel, MD  HYDROmorphone (DILAUDID) 4 MG tablet Take 8 mg by mouth every 4 (four) hours as needed for severe pain.    Historical Provider, MD  levothyroxine (SYNTHROID, LEVOTHROID) 25 MCG tablet Take 1 tablet (25 mcg total) by mouth daily before breakfast. 03/06/16   Richarda Overlie, MD  magnesium oxide (MAG-OX) 400 MG tablet Take 1 tablet (400 mg total) by mouth 2 (two) times daily. 08/15/16   Osvaldo Shipper, MD  potassium chloride (MICRO-K) 10 MEQ CR capsule Take 20 mEq by mouth 2 (two) times daily.    Historical Provider, MD  saccharomyces boulardii (FLORASTOR) 250 MG capsule Take 1 capsule (250 mg total) by mouth 2 (two) times daily. 10/22/16   Nishant Dhungel, MD  thiamine (VITAMIN B-1) 100 MG tablet Take 1 tablet (100 mg total) by mouth daily. 03/06/16   Richarda Overlie, MD  traZODone (DESYREL) 25 mg TABS tablet Take 0.5 tablets (25 mg total) by mouth at bedtime as  needed for sleep. 08/02/14   Kathlen Mody, MD  venlafaxine (EFFEXOR) 25 MG tablet Take 1 tablet (25 mg total) by mouth 2 (two) times daily. 07/01/16   Dorothea Ogle, MD    Family History Family History  Problem Relation Age of Onset  . Breast cancer Mother   . Heart disease Mother 81    CABG  . Diabetes Mother   . Heart disease Father 65    CABG  . Diabetes Father     Social History Social History  Substance Use Topics  . Smoking status: Current Every Day Smoker    Packs/day: 0.25    Years: 37.00    Types: Cigarettes  . Smokeless tobacco: Never Used  . Alcohol use No     Allergies   Iohexol; Spiriva handihaler [tiotropium bromide monohydrate]; Iodinated diagnostic agents; Ativan [lorazepam]; and Penicillins   Review of Systems Review of Systems  Constitutional: Positive for activity change and appetite change. Negative for chills and fever.  HENT: Negative for congestion.   Eyes: Negative for visual disturbance.   Respiratory: Negative for cough, shortness of breath and wheezing.   Cardiovascular: Negative for chest pain.  Gastrointestinal: Positive for abdominal pain (generlaized), diarrhea and nausea.  Genitourinary: Negative for dysuria, frequency, hematuria and urgency.  Musculoskeletal: Positive for back pain (chronic). Negative for neck pain and neck stiffness.  Skin: Positive for pallor. Negative for wound.  Neurological: Negative for dizziness, speech difficulty, weakness, light-headedness and numbness.  Psychiatric/Behavioral: Positive for confusion (per care giver).  All other systems reviewed and are negative.    Physical Exam Updated Vital Signs BP 105/81   Pulse 110 Comment: EDP aware of patient's vital signs  Temp 98.3 F (36.8 C) (Oral)   Resp (!) 28   LMP 10/01/2010 Comment: Has depro  SpO2 99%   Physical Exam  Constitutional: She is oriented to person, place, and time. She appears distressed.  Patient is very cachectic looking. She is very thin and appears oriented than stated age. She is rolling around in pain and states that she needs her Percocet.  HENT:  Head: Normocephalic and atraumatic.  Mouth/Throat: Oropharynx is clear and moist.  Eyes: Conjunctivae and EOM are normal. Pupils are equal, round, and reactive to light. Right eye exhibits no discharge. Left eye exhibits no discharge. No scleral icterus.  Significant proptosis.  Neck: Normal range of motion. Neck supple. No thyromegaly present.  Cardiovascular: Normal heart sounds and intact distal pulses.  An irregularly irregular rhythm present. Tachycardia present.  Exam reveals no gallop and no friction rub.   No murmur heard. Pulmonary/Chest: Effort normal and breath sounds normal. No respiratory distress. She has no wheezes. She has no rales. She exhibits no tenderness.  Abdominal: Soft. Bowel sounds are normal. She exhibits no distension. There is generalized tenderness. There is no rigidity, no rebound, no  guarding and no CVA tenderness.  Musculoskeletal: Normal range of motion.  Lymphadenopathy:    She has no cervical adenopathy.  Neurological: She is alert and oriented to person, place, and time.  Difficult to obtain accurate neuro exam. Patient is alert and oriented 3. Grip strength equal bilaterally. Cranial nerves II through XII grossly intact. Lower show any strength 5 out of 5.  Skin: Capillary refill takes less than 2 seconds. There is pallor.  Skin is cold and clammy. Decreased turgor.  Nursing note and vitals reviewed.    ED Treatments / Results  Labs (all labs ordered are listed, but only abnormal results are displayed)  Labs Reviewed  COMPREHENSIVE METABOLIC PANEL  CBC WITH DIFFERENTIAL/PLATELET  URINALYSIS, ROUTINE W REFLEX MICROSCOPIC  TROPONIN I  CBG MONITORING, ED  I-STAT CG4 LACTIC ACID, ED    EKG  EKG Interpretation  Date/Time:  Tuesday November 20 2016 13:56:14 EST Ventricular Rate:  144 PR Interval:    QRS Duration: 63 QT Interval:  335 QTC Calculation: 519 R Axis:   54 Text Interpretation:  Atrial fibrillation Anteroseptal infarct, old Abnormal T, probable ischemia, widespread Prolonged QT interval Lead(s) III aVL were not used for morphology analysis similar to prior EKG Confirmed by BELFI  MD, MELANIE ([id number]) on 11/20/2016 3:24:22 PM       Radiology Dg Chest 2 View  Result Date: 11/20/2016 CLINICAL DATA:  Shortness of breath.  Pain . EXAM: CHEST  2 VIEW COMPARISON:  11/06/2016. FINDINGS: Mediastinum and hilar structures are normal. Heart size normal. No focal infiltrate. No pleural effusion or pneumothorax. Deformity noted of the right posterior sixth rib consistent with old fracture. Rounded soft tissue density noted over the left upper abdomen most likely stool within colon or debris within the stomach. IMPRESSION: No acute cardiopulmonary disease. Electronically Signed   By: Maisie Fus  Register   On: 11/20/2016 14:56    Procedures Procedures  (including critical care time)  Medications Ordered in ED Medications  fentaNYL (SUBLIMAZE) injection 12.5 mcg (12.5 mcg Intravenous Given 11/20/16 1535)  sodium chloride 0.9 % bolus 1,000 mL (1,000 mLs Intravenous New Bag/Given 11/20/16 1558)     Initial Impression / Assessment and Plan / ED Course  I have reviewed the triage vital signs and the nursing notes.  Pertinent labs & imaging results that were available during my care of the patient were reviewed by me and considered in my medical decision making (see chart for details).     The patient presents to the ED with failure to thrive from home. Lives at home with her son who has been trying to get her into a nursing facility. Recently discharged from hospital on 2/7 for hypoglycemia likely due to poor by mouth intake. Have been unable to get an accurate history from patient. She complains of allover pain. She does complain of abdominal pain. We will obtain CT abdomen. Given the patient is on blood thinners we will obtain CT head. Patient with allergy to contrast. Noncontrast CT ordered. Patient blood sugar is normal in the ED. Difficult to obtain lab work due to patient with poor IV access. Patient does appear pale. Patient appears dehydrated. We'll give fluids through IV. She denies any infectious symptoms. She is afebrile. Normotensive. Appears to be in chronic pain asking for Percocet. Patient was seen and examined by Dr. Fredderick Phenix who agrees with the above workup. Patient is ill-appearing but likely chronic for patient. Awaiting lab results and CAT scan. Likely admission to the hospital. Care hand off to PA- Naudea. Was informed that pt troponin was elevated. She needs to be admitted. Unable to get accurate pulse ox and temp due to pt being uncooperative.    Final Clinical Impressions(s) / ED Diagnoses   Final diagnoses:  None    New Prescriptions New Prescriptions   No medications on file     Rise Mu, PA-C 11/20/16  1628    Rolan Bucco, MD 12/02/16 [phone]

## 2016-11-20 NOTE — H&P (Signed)
History and Physical    [name] NOT:[id number] DOB: 30-Jan-1961 DOA: 11/20/2016  PCP: Wenda Low, MD Patient coming from: home, who lives with her son; he is her NOK  Chief Complaint: weakness  HPI: [name] is a 56 y.o. female with medical history significant of multiple recent hospitalizations for delirium and dehydration; PE and LLE which required catheter-directed lysis in 4/17, on Eliquis; anemia of chronic disease; hypothyroidism; stage 2 pressure ulcer; recent E. Faecalis UTI with sepsis presenting with generalized weakness.  She presented today with c/o weakness and acknowledged minimal PO intake.  She was awake and alert at the time of my evaluation, but her labs were dramatically abnormal with severe metabolic acidosis requiring bicarbonate drip and so I requested the PCCM assume care of the patient.   ER visits and admissions in the last 6 months: -9/25 - 10/1 - admission for delirium, weakness and generalized body aches -10/3 - ER visit for facial swelling -11/11 - 15 - admission for metabolic acidosis, severe malnutrition, sepsis secondary to E. Faecalis UTI -12/18 - ER visit for new afib, appeared to actually be sinus tachycardia -1/18 - 22 - admitted for GI bleeding secondary to bleeding external hemorrhoids -2/6-7 - admission with hypoglycemia, transaminitis   ED Course: Patient with profound lab abnormalities.  She was hypothermic and started on a Retail banker.  Given Cefepime, Vanc.  Hgb dropping, transfusion initiated.  Troponin increasing.  Multiple bicarb pushes and bicarb drip started.  PCCM involved.  Review of Systems: Difficult to perform with severity of illness;  +SOB, diffuse pain including abdominal, otherwise as above   Past Medical History:  Diagnosis Date  . Anxiety   . Arthritis    "a little bit; all over" (05/01/2016)  . Asthma   . Asthma   . C. difficile colitis   . Chronic lower back pain   . Crohn disease ([place])   . Diabetes mellitus  without complication ([place])   . DVT (deep venous thrombosis) ([place]) ~ 03/2016   LLE  . Elevated lactic acid level   . GERD (gastroesophageal reflux disease)   . GI bleed 10/18/2016  . Glaucoma   . HLD (hyperlipidemia)   . Hypertension   . Hypothyroidism   . Neuropathy ([place])   . Pancreatitis   . Pulmonary embolus ([place]) 05/24/2014  . Thrombocytopenia ([place])   . Tobacco abuse     Past Surgical History:  Procedure Laterality Date  . CESAREAN SECTION  1996  . COLONOSCOPY N/A 03/02/2015   Procedure: COLONOSCOPY;  Surgeon: Ronald Lobo, MD;  Location: [facility] ENDOSCOPY;  Service: Endoscopy;  Laterality: N/A;  . ESOPHAGOGASTRODUODENOSCOPY N/A 05/21/2014   Procedure: ESOPHAGOGASTRODUODENOSCOPY (EGD);  Surgeon: Jeryl Columbia, MD;  Location: [facility] ENDOSCOPY;  Service: Endoscopy;  Laterality: N/A;  . ESOPHAGOGASTRODUODENOSCOPY N/A 03/02/2015   Procedure: ESOPHAGOGASTRODUODENOSCOPY (EGD);  Surgeon: Ronald Lobo, MD;  Location: [facility] ENDOSCOPY;  Service: Endoscopy;  Laterality: N/A;    Social History   Social History  . Marital status: Divorced    Spouse name: N/A  . Number of children: N/A  . Years of education: N/A   Occupational History  . Not on file.   Social History Main Topics  . Smoking status: Current Every Day Smoker    Packs/day: 0.25    Years: 37.00    Types: Cigarettes  . Smokeless tobacco: Never Used  . Alcohol use No  . Drug use: No  . Sexual activity: No   Other Topics Concern  . Not on file  Social History Narrative  . No narrative on file    Allergies  Allergen Reactions  . Iohexol Other (See Comments)    "severe burning" Patient has received Contrast in 2005 with 13 hour pre-medication, and had no reaction at that time  . Spiriva Handihaler [Tiotropium Bromide Monohydrate] Nausea And Vomiting  . Iodinated Diagnostic Agents Other (See Comments)    Feels like she is "on fire" for months afterwards, if exposed  . Ativan [Lorazepam] Other (See Comments)     Hallucinations/confusion/"swatting flies"  . Penicillins Hives    Has had cephalosporins Has patient had a PCN reaction causing immediate rash, facial/tongue/throat swelling, SOB or lightheadedness with hypotension: Yes Has patient had a PCN reaction causing severe rash involving mucus membranes or skin necrosis: Yes Has patient had a PCN reaction that required hospitalization: Yes Has patient had a PCN reaction occurring within the last 10 years: No If all of the above answers are "NO", then may proceed with Cephalosporin use.    Family History  Problem Relation Age of Onset  . Breast cancer Mother   . Heart disease Mother 28    CABG  . Diabetes Mother   . Heart disease Father 42    CABG  . Diabetes Father     Prior to Admission medications   Medication Sig Start Date End Date Taking? Authorizing Provider  apixaban (ELIQUIS) 5 MG TABS tablet Take 1 tablet (5 mg total) by mouth 2 (two) times daily. 05/02/16  Yes Reyne Dumas, MD  calcium carbonate (OS-CAL - DOSED IN MG OF ELEMENTAL CALCIUM) 1250 (500 CA) MG tablet Take 1 tablet (500 mg of elemental calcium total) by mouth daily with breakfast. 03/04/15  Yes Robbie Lis, MD  hydrocortisone (ANUSOL-HC) 2.5 % rectal cream Apply topically 4 (four) times daily. Patient taking differently: Apply 1 application topically 4 (four) times daily.  10/22/16  Yes Nishant Dhungel, MD  HYDROmorphone (DILAUDID) 4 MG tablet Take 8 mg by mouth every 4 (four) hours as needed for severe pain.   Yes Historical Provider, MD  levothyroxine (SYNTHROID, LEVOTHROID) 25 MCG tablet Take 1 tablet (25 mcg total) by mouth daily before breakfast. 03/06/16  Yes Reyne Dumas, MD  magnesium oxide (MAG-OX) 400 MG tablet Take 1 tablet (400 mg total) by mouth 2 (two) times daily. 08/15/16  Yes Bonnielee Haff, MD  thiamine (VITAMIN B-1) 100 MG tablet Take 1 tablet (100 mg total) by mouth daily. 03/06/16  Yes Reyne Dumas, MD  traZODone (DESYREL) 25 mg TABS tablet Take 0.5 tablets  (25 mg total) by mouth at bedtime as needed for sleep. 08/02/14  Yes Hosie Poisson, MD  venlafaxine (EFFEXOR) 25 MG tablet Take 1 tablet (25 mg total) by mouth 2 (two) times daily. 07/01/16  Yes Theodis Blaze, MD  feeding supplement, ENSURE ENLIVE, (ENSURE ENLIVE) LIQD Take 237 mLs by mouth 3 (three) times daily between meals. 10/22/16   Nishant Dhungel, MD  potassium chloride (MICRO-K) 10 MEQ CR capsule Take 20 mEq by mouth 2 (two) times daily.    Historical Provider, MD    Physical Exam: Vitals:   11/20/16 2030 11/20/16 2051 11/20/16 2106 11/20/16 2124  BP: 108/83 108/83  101/74  Pulse: (!) 125 (!) 125 (!) 126 (!) 126  Resp: 22 17 (!) 29 26  Temp:      TempSrc:      SpO2: 100% 100% 100% 100%     General: Appears acutely ill, under Bair hugger and still cold Eyes: PERRL, EOMI, normal  lids, iris; ?exophthalmous ENT:  grossly normal hearing, lips & tongue, mmm Neck:  no LAD, masses or thyromegaly Cardiovascular: Tachycardia, no m/r/g. No LE edema.  Respiratory:  CTA bilaterally, no w/r/r.  +tachypnea. Abdomen:  Soft,mildly diffusely tender, nd, NABS Skin:  no rash or induration seen on limited exam Musculoskeletal: grossly normal tone BUE/BLE, good ROM, no bony abnormality Psychiatric: grossly normal mood and affect, speech fluent and appropriate, AOx3 Neurologic:  CN 2-12 grossly intact, moves all extremities in coordinated fashion, sensation intact  Labs on Admission: I have personally reviewed following labs and imaging studies  CBC:  Recent Labs Lab 11/20/16 1527 11/20/16 1719 11/20/16 1841  WBC 6.9 7.5 7.5  NEUTROABS 4.7 6.4 6.4  HGB 7.5* 7.0* 5.4*  HCT 24.7* 22.7* 16.8*  MCV 104.2* 102.3* 98.8  PLT 18* 151 96*   Basic Metabolic Panel:  Recent Labs Lab 11/20/16 1527 11/20/16 1841  NA 151* 150*  K 4.1 3.6  CL 120* 117*  CO2 <7* 9*  GLUCOSE 138* 258*  BUN 15 13  CREATININE 1.22* 1.05*  CALCIUM 7.9* 6.8*   GFR: Estimated Creatinine Clearance: 40.5 mL/min (by  C-G formula based on SCr of 1.05 mg/dL (H)). Liver Function Tests:  Recent Labs Lab 11/20/16 1527  AST 239*  ALT 98*  ALKPHOS 129*  BILITOT 1.8*  PROT 4.1*  ALBUMIN 1.8*   No results for input(s): LIPASE, AMYLASE in the last 168 hours. No results for input(s): AMMONIA in the last 168 hours. Coagulation Profile: No results for input(s): INR, PROTIME in the last 168 hours. Cardiac Enzymes:  Recent Labs Lab 11/20/16 1528  TROPONINI 0.12*   BNP (last 3 results) No results for input(s): PROBNP in the last 8760 hours. HbA1C: No results for input(s): HGBA1C in the last 72 hours. CBG:  Recent Labs Lab 11/20/16 1503  GLUCAP 93   Lipid Profile: No results for input(s): CHOL, HDL, LDLCALC, TRIG, CHOLHDL, LDLDIRECT in the last 72 hours. Thyroid Function Tests: No results for input(s): TSH, T4TOTAL, FREET4, T3FREE, THYROIDAB in the last 72 hours. Anemia Panel: No results for input(s): VITAMINB12, FOLATE, FERRITIN, TIBC, IRON, RETICCTPCT in the last 72 hours. Urine analysis:    Component Value Date/Time   COLORURINE AMBER (A) 11/20/2016 1419   APPEARANCEUR CLEAR 11/20/2016 1419   LABSPEC 1.025 11/20/2016 1419   PHURINE 5.0 11/20/2016 1419   GLUCOSEU NEGATIVE 11/20/2016 1419   HGBUR NEGATIVE 11/20/2016 1419   BILIRUBINUR NEGATIVE 11/20/2016 1419   KETONESUR 20 (A) 11/20/2016 1419   PROTEINUR NEGATIVE 11/20/2016 1419   UROBILINOGEN 1.0 02/28/2015 1227   NITRITE NEGATIVE 11/20/2016 1419   LEUKOCYTESUR MODERATE (A) 11/20/2016 1419    Creatinine Clearance: Estimated Creatinine Clearance: 40.5 mL/min (by C-G formula based on SCr of 1.05 mg/dL (H)).  Sepsis Labs: @LABRCNTIP (procalcitonin:4,lacticidven:4) )No results found for this or any previous visit (from the past 240 hour(s)).   Radiological Exams on Admission: Ct Abdomen Pelvis Wo Contrast  Result Date: 11/20/2016 CLINICAL DATA:  56 y/o F; generalized abdominal pain. Failure to thrive at home. EXAM: CT ABDOMEN AND  PELVIS WITHOUT CONTRAST TECHNIQUE: Multidetector CT imaging of the abdomen and pelvis was performed following the standard protocol without IV contrast. COMPARISON:  05/15/2016 CT of abdomen and pelvis. FINDINGS: Lower chest: Stable 6 mm left lower lobe nodule (series 4, image 31). Moderate coronary artery calcification. Hepatobiliary: Hepatic steatosis. Normal gallbladder. Stable lesion within the dome of the liver with central fluid attenuation, probably a cyst, measuring up to 25 mm. Enlarged common bile  duct measuring up to 13 mm, previously 15 mm. Pancreas: Unremarkable. No pancreatic ductal dilatation or surrounding inflammatory changes. Spleen: Normal in size without focal abnormality. Adrenals/Urinary Tract: Bilateral kidney stones. No hydronephrosis. Mildly distended bladder. Stomach/Bowel: Mild diffuse distention of small bowel with collapsed terminal ileum and the right lower quadrant and largely collapsed colon except for large volume of stool in the rectum. Normal appendix. Retained contrast within the colon. Vascular/Lymphatic: Aortic atherosclerosis with severe calcification. Left common iliac vein stent. No enlarged abdominal or pelvic lymph nodes. Reproductive: Uterus and bilateral adnexa are unremarkable. Other: No abdominal wall hernia or abnormality. No abdominopelvic ascites. Musculoskeletal: No acute or significant osseous findings. IMPRESSION: 1. Mild diffuse distention of small bowel and severe gastric distention may represent obstruction or ileus. Collapsed terminal ileum and colon from ascending segment to a sigmoid suggesting possible transition point of the ileum in the right lower quadrant. 2. Large volume of stool in rectum. 3. Stable 6 mm nodule within the left lower lobe of the lung. Follow-up per prior CT abdomen and pelvis study recommendations. 4. Enlarged common bile duct measuring up to 13 mm, previously 15 mm. 5. Hepatic steatosis. 6. Aortic atherosclerosis. 7. Bilateral  nonobstructing kidney stones. Electronically Signed   By: Kristine Garbe M.D.   On: 11/20/2016 16:49   Dg Chest 2 View  Result Date: 11/20/2016 CLINICAL DATA:  Shortness of breath.  Pain . EXAM: CHEST  2 VIEW COMPARISON:  11/06/2016. FINDINGS: Mediastinum and hilar structures are normal. Heart size normal. No focal infiltrate. No pleural effusion or pneumothorax. Deformity noted of the right posterior sixth rib consistent with old fracture. Rounded soft tissue density noted over the left upper abdomen most likely stool within colon or debris within the stomach. IMPRESSION: No acute cardiopulmonary disease. Electronically Signed   By: Marcello Moores  Register   On: 11/20/2016 14:56   Ct Head Wo Contrast  Result Date: 11/20/2016 CLINICAL DATA:  Pt from home with complaints of generalized pain. Pt is normally in pain at her baseline. Pt is here for failure to thrive from home. Pt lives with many family members. Pt was recently hospitalized for hypoglycemia. Pt cbg for EMS was 47 Pt is tachycardic at this time. Per family pt has had poor appetite for 2 weeks. Pt denies CP. Pt has no cardiac history Pt sates she is having a hard time breathing, but her oxygen saturation is at 100% RA EXAM: CT HEAD WITHOUT CONTRAST TECHNIQUE: Contiguous axial images were obtained from the base of the skull through the vertex without intravenous contrast. COMPARISON:  08/12/2016 FINDINGS: Brain: No evidence of acute infarction, hemorrhage, hydrocephalus, extra-axial collection or mass lesion/mass effect. The ventricles and sulci are enlarged reflecting mild diffuse atrophy, stable from prior exam. There is mild periventricular white matter hypoattenuation consistent with chronic microvascular ischemic change, also stable. Vascular: No hyperdense vessel or unexpected calcification. Skull: Normal. Negative for fracture or focal lesion. Sinuses/Orbits: Visualize globes and orbits are unremarkable. Visualized sinuses and mastoid air  cells are clear. Other: None. IMPRESSION: 1. No acute intracranial abnormalities. 2. Mild atrophy and chronic microvascular ischemic change, stable from the prior study. Electronically Signed   By: Lajean Manes M.D.   On: 11/20/2016 16:36    EKG: Independently reviewed.  Afib with rate 144; nonspecific ST changes with possible ischemia; Prolonged QT interval, 519   Assessment/Plan Principal Problem:   Shock ([place]) Active Problems:   Metabolic acidosis   Anemia   History of pulmonary embolus (PE)   Dehydration  Elevated lactic acid level   Severe protein-calorie malnutrition (HCC)   Chronic pain   Thrombocytopenia (HCC)   Hypothyroidism   Abdominal pain   AKI (acute kidney injury) ([place])   Sacral decubitus ulcer, stage II    Pertinent labs: ABG: 7.282, pCO2 below reportable level, pO2 230 Na 151, 150; normal (138) on 2/7 Cl 120, 117; normal (109) on 2/7 CO2 <7, 9; 19 on 2/7 Glucose 258 Creatinine 1.22, 1.05; normal (0.69) on 2/7 Calcium 7.9, 6.8; 6.9 on 2/7 Anion gap 24 Troponin 0.12, 0.19 Lactate 9.86, 9.4, 9.31; normal (1.14) on 2/6 Hgb 7.5, 5.4 Platelets 96 Heme positive Alk phos 129; normal (68) on 2/7 Albumin 1.8; 1.6 on 2/7 Bilirubin 1.8; normal (0.9) on 2/7 UA moderate LE, negative nitrite, 6-30 WBC, few bacteria 2/7 - Klebsiella pneumoniae UTI pansensitive except for ampicillin  Neuro/Psych:  -Patient is actually able to converse fairly appropriately, which is remarkable given her serious medical condition.  -Would consider APS consult, as it appears that the patient is not able to be successfully cared for at home as evidenced by recurrent admissions/ER visits  CV:  -Elevated troponin, trending up  -EKG with changes that are concerning for ischemia but not STEMI  -EKG changes and troponin could be related to cardiac strain, type II NSTEMI  -Patient also appears to be in new-onset afib with RVR, although this could be influenced by artifact  -She is already  prescribed Eliquis for h/o DVT/PE but has not been taking it; CHA2DS2-VASc score appears to be 3, with a stroke rate of 3.2%/year  -Consider rate control with Cardizem drip  -Echo in 11/17 showed preserved EF, grade 1 diastolic dysfunction  -Likely to benefit from cardiology consultation - if troponin continues to increase, she should probably be seen tonight  -Unfortunately, with an actively worsening anemia (see below), starting her on anticoagulation is contraindicated  Pulm:  -Critically low bicarb, which appears to be metabolic in nature  -She may have some respiratory compensation and may be mildly hypoxic, but it is very difficult to maintain her O2 sat pleth  -She did have a negative CXR and her primary issues do not appear to be pulmonary  GI:  -She reports 1 episode of hematemesis sometime in the last week  -Otherwise denies GI bleeding  -Heme positive  -Hgb was 9.3 on 2/6, was 7.5 on presentation today and has steadily decreased to 7 and then 5.4  -Will transfuse 2 units PRBC and continue to closely monitor  -No current source of active bleeding evident but obviously this is a significant concern  -May need GI input, but without obvious bleeding from above or below this is a somewhat puzzling presentation  -She does c/o mild abdominal pain and her CT was abnormal; with such a markedly elevated lactate and elevated troponin, ischemic colitis is a consideration and GI/Gen surg may need to be involved.  -She may benefit from placement of an NG tube  -She has a profound malnutrition and I briefly discussed the idea of a feeding tube, which she was not enthusiastic about (but that is reasonable given the multitude of other acute problems she is facing)  GU:  -h/o recent Klebsiella UTI  -UA is not particularly remarkable today  -She is on abx for empiric coverage of sepsis and this would likely cover UTI  -Urine culture pending  Renal:  -Very mild AKI - previously 0.69, now 1.05  (with 1.22 on presentation)  -However, she is profoundly acidotic with presenting pCO2  undetectable  -This may be related to profound dehydration, but other causes are also of concern  -She has been started on MIVF as well as bicarbonate infusion with already some improvement  FEN:  -Hypernatremia, likely related to dehydration  -Hyperchloremia, the same  -Hypocalcemia, check ionized calcium and replete if <1  -NPO for now with aggressive IVF hydration  Endo:  -h/o DM, presenting with hyperglycemia and also an anion gap  -She may benefit from an insulin drip for now, although she would likely also need glucose infusion   -h/o hypothyroidism with normal TSH on 10/20/16, can continue home Synthroid dose  Heme/Onc:  -Patient with known h/o normocytic anemia  -Anemia has been stable  -Tonight, her hemoglobin has been dramatically decreasing for unclear reasons  -Will order anemia panel  -Also some concern for DIC, haptoglobin and LDH ordered and will order DIC panel  -She does have h/o LLE DVT with PE with extensive clot burden requiring thrombolysis; consider CTA for evaluation of lungs since she has not been taking her Eliquis  ID:  -Hypothermia, tachycardia, tachypnea, with marked elevation in lactate  -Concerning for septic shock  -Sepsis order set utilized, patient covered with Cefepime and Vanc for now  -She does not endorse other obvious sources of infection  -As noted above, she has recent h/o UTI but currently UA looks somewhat better  -Blood and urine cultures pending  MSK:  -No current issues  Derm:  -h/o sacral pressure ulcer, likely still present  -Will request wound care consult    DVT prophylaxis: SCDs Code Status:  Full - confirmed with patient Family Communication: None available Disposition Plan: To be determined Consults called: PCCM - patient to be transferred to their service and has been seen by Dr. Corrie Dandy Admission status: Admit - It is my clinical  opinion that admission to INPATIENT is reasonable and necessary because this patient will require at least 2 midnights in the hospital to treat this condition based on the medical complexity of the problems presented.  Given the aforementioned information, the predictability of an adverse outcome is felt to be significant.  Total critical care time: 65 minutes Critical care time was exclusive of separately billable procedures and treating other patients. Critical care was necessary to treat or prevent imminent or life-threatening deterioration. Critical care was time spent personally by me on the following activities: development of treatment plan with patient and/or surrogate as well as nursing, discussions with consultants, evaluation of patient's response to treatment, examination of patient, obtaining history from patient or surrogate, ordering and performing treatments and interventions, ordering and review of laboratory studies, ordering and review of radiographic studies, pulse oximetry and re-evaluation of patient's condition.  Karmen Bongo MD [facility]  If 7PM-7AM, please contact night-coverage [email or web address] Password [facility]  11/20/2016, 9:33 PM

## 2016-11-20 NOTE — ED Notes (Addendum)
Per ED PA, hold on lactic acid until patient receives fluids. EDP aware of patient's vital signs.

## 2016-11-20 NOTE — ED Notes (Signed)
EDP at bedside to insert emergency central line. Unable to obtain vital signs at this time.

## 2016-11-20 NOTE — ED Provider Notes (Signed)
Medical screening examination/treatment/procedure(s) were conducted as a shared visit with non-physician practitioner(s) and myself.  I personally evaluated the patient during the encounter.   EKG Interpretation  Date/Time:  Tuesday November 20 2016 13:56:14 EST Ventricular Rate:  144 PR Interval:    QRS Duration: 63 QT Interval:  335 QTC Calculation: 519 R Axis:   54 Text Interpretation:  Atrial fibrillation Anteroseptal infarct, old Abnormal T, probable ischemia, widespread Prolonged QT interval Lead(s) III aVL were not used for morphology analysis similar to prior EKG Confirmed by BELFI  MD, MELANIE ([id number]) on 11/20/2016 3:24:22 PM      Patient critically ill with severe metabolic acidosis, appears to be peri-arrest. Signs of active GI bleed, drop in hemoglobin and signs of ileus vs obstruction on CT abdomen. Lack of access prompted emergent CVC placement. Multiple bicarb pushes and bicarb infusion initiated with plan to track blood gasses. Pt remained critically ill throughout ED course, critical care consulted for help managing.  CENTRAL LINE Performed by: Lyndal Pulley Consent: The procedure was performed in an emergent situation. Required items: required blood products, implants, devices, and special equipment available Patient identity confirmed: arm band and provided demographic data Time out: Immediately prior to procedure a "time out" was called to verify the correct patient, procedure, equipment, support staff and site/side marked as required. Indications: vascular access Anesthesia: local infiltration Local anesthetic: lidocaine 1% with epinephrine Anesthetic total: 3 ml Patient sedated: no Preparation: skin prepped with 2% chlorhexidine Skin prep agent dried: skin prep agent completely dried prior to procedure Sterile barriers: all five maximum sterile barriers used - cap, mask, sterile gown, sterile gloves, and large sterile sheet Hand hygiene: hand hygiene performed  prior to central venous catheter insertion  Location details: right femoral  Catheter type: triple lumen Catheter size: 8 Fr Pre-procedure: landmarks identified Ultrasound guidance: no Successful placement: yes Post-procedure: line sutured and dressing applied Assessment: blood return through all parts, free fluid flow Patient tolerance: Patient tolerated the procedure well with no immediate complications.   CRITICAL CARE Performed by: Lyndal Pulley Total critical care time: 30 minutes Critical care time was exclusive of separately billable procedures and treating other patients. Critical care was necessary to treat or prevent imminent or life-threatening deterioration. Critical care was time spent personally by me on the following activities: development of treatment plan with patient and/or surrogate as well as nursing, discussions with consultants, evaluation of patient's response to treatment, examination of patient, obtaining history from patient or surrogate, ordering and performing treatments and interventions, ordering and review of laboratory studies, ordering and review of radiographic studies, pulse oximetry and re-evaluation of patient's condition.   See related encounter note    Lyndal Pulley, MD 11/20/16 [phone]

## 2016-11-20 NOTE — Consult Note (Addendum)
PULMONARY / CRITICAL CARE MEDICINE   Name: CESAR MUSACCHIA MRN: [id number] DOB: Sep 05, 1961    ADMISSION DATE:  11/20/2016 CONSULTATION DATE:  11/20/2016  REFERRING MD:  Dr. Clydene Pugh EDP  CHIEF COMPLAINT:  Failure to thrive  HISTORY OF PRESENT ILLNESS:   56 year old female with PMH as below, which is significant for HTN, hypothyroid, PAF on apixaban, and chronic pain. She was recently admitted essentially for malnutrition causing transaminitis, hypoglycemia, and hypocalcemia. Also found to have asymptomatic bacteriuria. She was discharged 2/7 after one day with instructions to continue her prescribed medications and nutritional supplements. She presented again 2/20 to [facility] ED after 2 weeks at home of not getting out of bed and barely eating or drinking. She also had been refusing to take her medications and has continued hypoglycemia. In the ED she had complaints of all over pain including abdominal pain and was started on IVF for dehydration based on her appearance. Workup in the ED significant for severe lactic acidosis, despite normotension. She was started on HCO3 infusion and was admitted to the hospitalist team. PCCM asked to see in consultation.  Per EMS, pt came from home with "family" but she is not necessarily being taken cared of by her family.   Pt states that the last time she ate was a week ago. She has not been eating as she feels "fine".  She complained of pain all over.  She has not been taking meds except for Dilaudid and tylenol.   By the time I saw her, she has got in 3.5 L of IV fluids. Her blood pressure was acceptable at 104/60. She continues to have bicarbonate drip. She got bicarbonate pushes for severe metabolic acidosis. She remains hypothermic. No real subjective complaints except for pain all over.  I asked her if someone was abusing her at home. She denies abuse.    PAST MEDICAL HISTORY :  She  has a past medical history of Anxiety; Arthritis; Asthma; Asthma; C.  difficile colitis; Chronic lower back pain; Crohn disease (HCC); Diabetes mellitus without complication (HCC); DVT (deep venous thrombosis) (HCC) (~ 03/2016); Elevated lactic acid level; GERD (gastroesophageal reflux disease); GI bleed (10/18/2016); Glaucoma; HLD (hyperlipidemia); Hypertension; Hypothyroidism; Neuropathy (HCC); Pancreatitis; Pulmonary embolus (HCC) (05/24/2014); Thrombocytopenia (HCC); and Tobacco abuse.  PAST SURGICAL HISTORY: She  has a past surgical history that includes Cesarean section (1996); Esophagogastroduodenoscopy (N/A, 05/21/2014); Esophagogastroduodenoscopy (N/A, 03/02/2015); and Colonoscopy (N/A, 03/02/2015).  Allergies  Allergen Reactions  . Iohexol Other (See Comments)    "severe burning" Patient has received Contrast in 2005 with 13 hour pre-medication, and had no reaction at that time  . Spiriva Handihaler [Tiotropium Bromide Monohydrate] Nausea And Vomiting  . Iodinated Diagnostic Agents Other (See Comments)    Feels like she is "on fire" for months afterwards, if exposed  . Ativan [Lorazepam] Other (See Comments)    Hallucinations/confusion/"swatting flies"  . Penicillins Hives    Has had cephalosporins Has patient had a PCN reaction causing immediate rash, facial/tongue/throat swelling, SOB or lightheadedness with hypotension: Yes Has patient had a PCN reaction causing severe rash involving mucus membranes or skin necrosis: Yes Has patient had a PCN reaction that required hospitalization: Yes Has patient had a PCN reaction occurring within the last 10 years: No If all of the above answers are "NO", then may proceed with Cephalosporin use.    No current facility-administered medications on file prior to encounter.    Current Outpatient Prescriptions on File Prior to Encounter  Medication Sig  .  apixaban (ELIQUIS) 5 MG TABS tablet Take 1 tablet (5 mg total) by mouth 2 (two) times daily.  . calcium carbonate (OS-CAL - DOSED IN MG OF ELEMENTAL CALCIUM) 1250 (500  CA) MG tablet Take 1 tablet (500 mg of elemental calcium total) by mouth daily with breakfast.  . hydrocortisone (ANUSOL-HC) 2.5 % rectal cream Apply topically 4 (four) times daily. (Patient taking differently: Apply 1 application topically 4 (four) times daily. )  . HYDROmorphone (DILAUDID) 4 MG tablet Take 8 mg by mouth every 4 (four) hours as needed for severe pain.  Marland Kitchen levothyroxine (SYNTHROID, LEVOTHROID) 25 MCG tablet Take 1 tablet (25 mcg total) by mouth daily before breakfast.  . magnesium oxide (MAG-OX) 400 MG tablet Take 1 tablet (400 mg total) by mouth 2 (two) times daily.  Marland Kitchen thiamine (VITAMIN B-1) 100 MG tablet Take 1 tablet (100 mg total) by mouth daily.  . traZODone (DESYREL) 25 mg TABS tablet Take 0.5 tablets (25 mg total) by mouth at bedtime as needed for sleep.  Marland Kitchen venlafaxine (EFFEXOR) 25 MG tablet Take 1 tablet (25 mg total) by mouth 2 (two) times daily.  . feeding supplement, ENSURE ENLIVE, (ENSURE ENLIVE) LIQD Take 237 mLs by mouth 3 (three) times daily between meals.  . potassium chloride (MICRO-K) 10 MEQ CR capsule Take 20 mEq by mouth 2 (two) times daily.    FAMILY HISTORY:  Her indicated that her mother is deceased. She indicated that her father is deceased.    SOCIAL HISTORY: She  reports that she has been smoking Cigarettes.  She has a 9.25 pack-year smoking history. She has never used smokeless tobacco. She reports that she does not drink alcohol or use drugs.  REVIEW OF SYSTEMS:   As mentioned.  Anorexia, poor by mouth intake, generalized weakness, not really walking. Denies cough, dyspnea, chest pain, fevers, chills, diarrhea.   SUBJECTIVE:  As mentioned.   VITAL SIGNS: BP 101/74 (BP Location: Right Arm)   Pulse (!) 126   Temp (!) 95 F (35 C) (Rectal)   Resp 26   LMP 10/01/2010 Comment: Has depro  SpO2 100%   HEMODYNAMICS:    VENTILATOR SETTINGS:    INTAKE / OUTPUT: I/O last 3 completed shifts: In: 1050 [IV Piggyback:1050] Out: -   PHYSICAL  EXAMINATION: General:  Cachectic, chronically ill, contracted. Neuro:  CN grossly intact.  (-) lateralizing signs HEENT:  Icteric sclerae, (-) NVD.  Dry oral mucosa. (-) oral thrush.  Cardiovascular:  Good s1/s2. Tachycardic. (-) m/r/g.  Lungs:  Dec BS on BLF. Some crackles at base.  Abdomen:  Dec BS, soft, non tender. (-) masses Musculoskeletal:  Contracted. Grade 1 edema. Cool distal extremities.  Skin:  Warm skin but distally was cool to touch. (-) rash  LABS:  BMET  Recent Labs Lab 11/20/16 1527 11/20/16 1841  NA 151* 150*  K 4.1 3.6  CL 120* 117*  CO2 <7* 9*  BUN 15 13  CREATININE 1.22* 1.05*  GLUCOSE 138* 258*    Electrolytes  Recent Labs Lab 11/20/16 1527 11/20/16 1841  CALCIUM 7.9* 6.8*    CBC  Recent Labs Lab 11/20/16 1527 11/20/16 1719 11/20/16 1841  WBC 6.9 7.5 7.5  HGB 7.5* 7.0* 5.4*  HCT 24.7* 22.7* 16.8*  PLT 18* 151 96*    Coag's No results for input(s): APTT, INR in the last 168 hours.  Sepsis Markers  Recent Labs Lab 11/20/16 1726 11/20/16 1841 11/20/16 1852  LATICACIDVEN 9.86* 9.4* 9.31*    ABG  Recent  Labs Lab 11/20/16 1725  PHART 7.282*  PCO2ART BELOW REPORTABLE RANGE  PO2ART 230*    Liver Enzymes  Recent Labs Lab 11/20/16 1527  AST 239*  ALT 98*  ALKPHOS 129*  BILITOT 1.8*  ALBUMIN 1.8*    Cardiac Enzymes  Recent Labs Lab 11/20/16 1528  TROPONINI 0.12*    Glucose  Recent Labs Lab 11/20/16 1503  GLUCAP 93    Imaging Ct Abdomen Pelvis Wo Contrast  Result Date: 11/20/2016 CLINICAL DATA:  56 y/o F; generalized abdominal pain. Failure to thrive at home. EXAM: CT ABDOMEN AND PELVIS WITHOUT CONTRAST TECHNIQUE: Multidetector CT imaging of the abdomen and pelvis was performed following the standard protocol without IV contrast. COMPARISON:  05/15/2016 CT of abdomen and pelvis. FINDINGS: Lower chest: Stable 6 mm left lower lobe nodule (series 4, image 31). Moderate coronary artery calcification.  Hepatobiliary: Hepatic steatosis. Normal gallbladder. Stable lesion within the dome of the liver with central fluid attenuation, probably a cyst, measuring up to 25 mm. Enlarged common bile duct measuring up to 13 mm, previously 15 mm. Pancreas: Unremarkable. No pancreatic ductal dilatation or surrounding inflammatory changes. Spleen: Normal in size without focal abnormality. Adrenals/Urinary Tract: Bilateral kidney stones. No hydronephrosis. Mildly distended bladder. Stomach/Bowel: Mild diffuse distention of small bowel with collapsed terminal ileum and the right lower quadrant and largely collapsed colon except for large volume of stool in the rectum. Normal appendix. Retained contrast within the colon. Vascular/Lymphatic: Aortic atherosclerosis with severe calcification. Left common iliac vein stent. No enlarged abdominal or pelvic lymph nodes. Reproductive: Uterus and bilateral adnexa are unremarkable. Other: No abdominal wall hernia or abnormality. No abdominopelvic ascites. Musculoskeletal: No acute or significant osseous findings. IMPRESSION: 1. Mild diffuse distention of small bowel and severe gastric distention may represent obstruction or ileus. Collapsed terminal ileum and colon from ascending segment to a sigmoid suggesting possible transition point of the ileum in the right lower quadrant. 2. Large volume of stool in rectum. 3. Stable 6 mm nodule within the left lower lobe of the lung. Follow-up per prior CT abdomen and pelvis study recommendations. 4. Enlarged common bile duct measuring up to 13 mm, previously 15 mm. 5. Hepatic steatosis. 6. Aortic atherosclerosis. 7. Bilateral nonobstructing kidney stones. Electronically Signed   By: Mitzi Hansen M.D.   On: 11/20/2016 16:49   Dg Chest 2 View  Result Date: 11/20/2016 CLINICAL DATA:  Shortness of breath.  Pain . EXAM: CHEST  2 VIEW COMPARISON:  11/06/2016. FINDINGS: Mediastinum and hilar structures are normal. Heart size normal. No focal  infiltrate. No pleural effusion or pneumothorax. Deformity noted of the right posterior sixth rib consistent with old fracture. Rounded soft tissue density noted over the left upper abdomen most likely stool within colon or debris within the stomach. IMPRESSION: No acute cardiopulmonary disease. Electronically Signed   By: Maisie Fus  Register   On: 11/20/2016 14:56   Ct Head Wo Contrast  Result Date: 11/20/2016 CLINICAL DATA:  Pt from home with complaints of generalized pain. Pt is normally in pain at her baseline. Pt is here for failure to thrive from home. Pt lives with many family members. Pt was recently hospitalized for hypoglycemia. Pt cbg for EMS was 66 Pt is tachycardic at this time. Per family pt has had poor appetite for 2 weeks. Pt denies CP. Pt has no cardiac history Pt sates she is having a hard time breathing, but her oxygen saturation is at 100% RA EXAM: CT HEAD WITHOUT CONTRAST TECHNIQUE: Contiguous axial images were obtained  from the base of the skull through the vertex without intravenous contrast. COMPARISON:  08/12/2016 FINDINGS: Brain: No evidence of acute infarction, hemorrhage, hydrocephalus, extra-axial collection or mass lesion/mass effect. The ventricles and sulci are enlarged reflecting mild diffuse atrophy, stable from prior exam. There is mild periventricular white matter hypoattenuation consistent with chronic microvascular ischemic change, also stable. Vascular: No hyperdense vessel or unexpected calcification. Skull: Normal. Negative for fracture or focal lesion. Sinuses/Orbits: Visualize globes and orbits are unremarkable. Visualized sinuses and mastoid air cells are clear. Other: None. IMPRESSION: 1. No acute intracranial abnormalities. 2. Mild atrophy and chronic microvascular ischemic change, stable from the prior study. Electronically Signed   By: Amie Portland M.D.   On: 11/20/2016 16:36     STUDIES:  CT head 2/20 > nonacute CT abd/pelv 2/20 > Mild diffuse distention of  small bowel and severe gastric distention may represent obstruction or ileus. Collapsed terminal ileum and colon from ascending segment to a sigmoid suggesting possible transition point of the ileum in the right lower quadrant. Large volume of stool in rectum. Stable 6 mm nodule within the left lower lobe of the lung. Enlarged common bile duct measuring up to 13 mm, previously 15 mm. Bilateral nonobstructing kidney stones  CULTURES: Blood 2/20 > Urine 2/20 >  MRSA 2/20 >   ANTIBIOTICS: Cefepime 2/20 > Vancomycin 2/20 >  SIGNIFICANT EVENTS: 2/6 >2/7 admit for FTT 2/20 admit  LINES/TUBES: CVL 2/20 >  DISCUSSION: 87 female, recent admission for malnutrition, failure to thrive, UTI, admitted after being home for 2 weeks with failure to thrive again, confusion, hypotension, lactic acidosis. Per EMS, patient has "family" but no one really is taking care of her allegedly. She is bedbound. The last time she had a meal was a week ago.   ASSESSMENT / PLAN:  PULMONARY A: At risk for healthcare associated pneumonia. Chest x-ray with no infiltrates. At risk for pulmonary edema. She was volume resuscitated.  Hypoxemia likely related to hypoperfusion/lactic acidosis/hypovolemia H/o pulmonary embolism P:   She was on nonrebreather when I saw her. Her O2 saturation was not picking up as she was cool and hypothermic. Plan to get ABG. Titrate FiO2 to keep her O2 sats more than 88%. Currently protecting her airway and not in respiratory distress. Observe respiratory status with blood transfusion. She has got and 3.5 L of fluids. May go into pulmonary congestion. Holding off on OAC/heparin 2/2 anemia   CARDIOVASCULAR A:  Hypotension related to hypovolemia. Differential is septic shock from occult infection Tachycardia related to hypovolemia.  Severe lactic acidosis likely 2/2 hypoperfusion, R/O infection.  P:  By the time I saw her, her BP was acceptable.  She has gotten 3.5 L IVF and 2 u pRBC  has been ordered.  Keep bicarbonate drip running at 100 mls/hr.  Abx to cover for possible infection.  trend lactic acid   RENAL A:   AKI, 2/2 hypovolemia Severe metabolic acidosis Hypernatremia and Hyperchloremia Severe lactic acidosis likely 2/2 hypoperfusion P:   As mentioned above. Cont with bicarbonate drip at 100 mls an hour. WOF congestion.  Getting BMP q 4 >> may need bicarbonate pushes if bicarbonate remains low Check ABG. Trend lactate  GASTROINTESTINAL A:   Transaminits 2/2 Severe malnutrition Severe malnutrition Concern for Drug overdose with APAP GERD Crohns dse P:   Trend LFTs.  Check APAP and salicylate levels Keep NPO for now.  WOF refeeding syndrome.  Nutrition consult in am.  HEMATOLOGIC A:   Anemia.  No obvious  source of bleeding.  P:  Transfuse 2 u pRBC slowly. Watch out for congestion. She was supposed to be on blood thinners for pulmonary embolism but according to the patient, she has not been taking that medicine.  INFECTIOUS A:   Recent UTI. (Klebsiella pna) Shock likely 2/2 hypovolemia, R/O septic from occult infection Lactic acidosis likely 2/2 hypoperfusion, R/O occult infection P:   Trend lactic acid Cont cefepime and vancomycin for now Panculture. Deescalate once with cultures.  Cont IVF. Not on pressors. Keep MAP > 60 mm Hg.   ENDOCRINE A:   DM P:   SSI and CBG q 4hrs  NEUROLOGIC A:   Chronic pain Concern for Substance abuse P:   RASS goal: 0 Per EMS, she has only been taking Dilaudid and Tylenol. Holding off on further opiates for now.  She looked comfortable when I saw her. If she needs pain medicines, low-dose fentanyl IV may work Check drug screen etoh level, apap level, salicylate level.    FAMILY  - Updates: No family at bedside. I do not even think that she has family to begin with. Patient will need social services consult in the morning. She needs to be placed in a nursing home once discharged.  I am not sure  if she needs APS. Patient denies being abused.   - Inter-disciplinary family meet or Palliative Care meeting due by:  11/27/16    I spent  35 minutes of Critical Care time with this patient today. Admit patient to the ICU.   Pollie Meyer, MD 11/20/2016, 11:43 PM  Pulmonary and Critical Care Pager [phone] After 3 pm or if no answer, call [phone]

## 2016-11-20 NOTE — ED Triage Notes (Addendum)
Pt from home with complaints of generalized pain. Pt is normally in pain at her baseline. Pt is here for failure to thrive from home. Pt lives with many family members. Pt was recently hospitalized for hypoglycemia. Pt cbg for EMS was 66 Pt is tachycardic at this time. Per family pt has had poor appetite for 2 weeks. Pt denies CP. Pt has no cardiac history Pt sates she is having a hard time breathing, but her oxygen saturation is at 100% RA

## 2016-11-20 NOTE — ED Notes (Addendum)
Patient's oxygen in the 80s. EDP is aware. Not able to get sufficient wave form on multiple devices. Patient states she is not short of breath or having any chest pain. Spoke with [place], [place], again and she states to keep patient on 5 L [place] for right now. Patient's oxygen saturation 87%.

## 2016-11-20 NOTE — ED Notes (Signed)
EDP aware of patient's vital signs. 

## 2016-11-20 NOTE — ED Notes (Signed)
Hospitalist at bedside 

## 2016-11-20 NOTE — ED Notes (Addendum)
1 IV attempt by this RN unsuccessful. 2 IV attempt by another RN unsuccessful.

## 2016-11-20 NOTE — ED Notes (Signed)
Patient transported to CT 

## 2016-11-20 NOTE — ED Notes (Signed)
Pt is not cooperating with staff.  Tried to get a complete set of vital signs but pt refuse to be still.  Informed PA. And RN.

## 2016-11-20 NOTE — ED Notes (Signed)
The lab will repeat the CBC

## 2016-11-20 NOTE — ED Notes (Signed)
Respiratory at bedside attempting arterial blood gas.

## 2016-11-20 NOTE — ED Notes (Signed)
Lab called with a critical Hemoglobin of 5.4 Dr. Clydene Pugh aware

## 2016-11-20 NOTE — ED Notes (Signed)
Bed: RU04 Expected date:  Expected time:  Means of arrival:  Comments: EMS- 56yo F, weakness/tachy 130s-140s

## 2016-11-20 NOTE — ED Notes (Signed)
1 set of blood cultures obtained off central line prior to antibiotic administration.  Unable to obtain 2nd set of blood cultures due to lack of vascular access. Dr. Clydene Pugh aware and states to start antibiotics.

## 2016-11-20 NOTE — ED Notes (Signed)
Respiratory notified of blood gas.

## 2016-11-20 NOTE — Progress Notes (Signed)
Pharmacy Antibiotic Note  [name] is a 56 y.o. female admitted on 11/20/2016 with sepsis.  Pharmacy has been consulted for Vancomycin & Cefepime dosing.  Plan:  Vancomycin 1gm IV x 1 followed by 750mg  IV q24h  Vancomycin trough goal: 15-20 mcg/ml  Cefepime 2gm IV x 1 followed by 1gm IV q24h  F/U renal function, cultures, clinical course    Temp (24hrs), Avg:97.2 F (36.2 C), Min:96.1 F (35.6 C), Max:98.3 F (36.8 C)   Recent Labs Lab 11/20/16 1527 11/20/16 1719 11/20/16 1726  WBC 6.9 7.5  --   CREATININE 1.22*  --   --   LATICACIDVEN  --   --  9.86*    Estimated Creatinine Clearance: 34.8 mL/min (by C-G formula based on SCr of 1.22 mg/dL (H)).    Allergies  Allergen Reactions  . Iohexol Other (See Comments)    "severe burning" Patient has received Contrast in 2005 with 13 hour pre-medication, and had no reaction at that time  . Spiriva Handihaler [Tiotropium Bromide Monohydrate] Nausea And Vomiting  . Iodinated Diagnostic Agents Other (See Comments)    Feels like she is "on fire" for months afterwards, if exposed  . Ativan [Lorazepam] Other (See Comments)    Hallucinations/confusion/"swatting flies"  . Penicillins Hives    Has had cephalosporins Has patient had a PCN reaction causing immediate rash, facial/tongue/throat swelling, SOB or lightheadedness with hypotension: Yes Has patient had a PCN reaction causing severe rash involving mucus membranes or skin necrosis: Yes Has patient had a PCN reaction that required hospitalization: Yes Has patient had a PCN reaction occurring within the last 10 years: No If all of the above answers are "NO", then may proceed with Cephalosporin use.    Antimicrobials this admission: 2/20 vanc >>   2/20 cefepime >>    Dose adjustments this admission:    Microbiology results: 2/20 BCx: sent 2/20 UCx: sent    Thank you for allowing pharmacy to be a part of this patient's care.  Maryellen Pile,  PharmD 11/20/2016 6:46 PM

## 2016-11-20 NOTE — ED Notes (Signed)
Admitting NP Craige Cotta (With Dr. Ophelia Charter) will be putting in an order for 2 Units of blood.  Critical care doc at bedisde

## 2016-11-20 NOTE — H&P (Signed)
PULMONARY / CRITICAL CARE MEDICINE   Name: [name] MRN: [id number] DOB: Sep 05, 1961    ADMISSION DATE:  11/20/2016 CONSULTATION DATE:  11/20/2016  REFERRING MD:  Dr. Clydene Pugh EDP  CHIEF COMPLAINT:  Failure to thrive  HISTORY OF PRESENT ILLNESS:   56 year old female with PMH as below, which is significant for HTN, hypothyroid, PAF on apixaban, and chronic pain. She was recently admitted essentially for malnutrition causing transaminitis, hypoglycemia, and hypocalcemia. Also found to have asymptomatic bacteriuria. She was discharged 2/7 after one day with instructions to continue her prescribed medications and nutritional supplements. She presented again 2/20 to [facility] ED after 2 weeks at home of not getting out of bed and barely eating or drinking. She also had been refusing to take her medications and has continued hypoglycemia. In the ED she had complaints of all over pain including abdominal pain and was started on IVF for dehydration based on her appearance. Workup in the ED significant for severe lactic acidosis, despite normotension. She was started on HCO3 infusion and was admitted to the hospitalist team. PCCM asked to see in consultation.  Per EMS, pt came from home with "family" but she is not necessarily being taken cared of by her family.   Pt states that the last time she ate was a week ago. She has not been eating as she feels "fine".  She complained of pain all over.  She has not been taking meds except for Dilaudid and tylenol.   By the time I saw her, she has got in 3.5 L of IV fluids. Her blood pressure was acceptable at 104/60. She continues to have bicarbonate drip. She got bicarbonate pushes for severe metabolic acidosis. She remains hypothermic. No real subjective complaints except for pain all over.  I asked her if someone was abusing her at home. She denies abuse.    PAST MEDICAL HISTORY :  She  has a past medical history of Anxiety; Arthritis; Asthma; Asthma; C.  difficile colitis; Chronic lower back pain; Crohn disease (HCC); Diabetes mellitus without complication (HCC); DVT (deep venous thrombosis) (HCC) (~ 03/2016); Elevated lactic acid level; GERD (gastroesophageal reflux disease); GI bleed (10/18/2016); Glaucoma; HLD (hyperlipidemia); Hypertension; Hypothyroidism; Neuropathy (HCC); Pancreatitis; Pulmonary embolus (HCC) (05/24/2014); Thrombocytopenia (HCC); and Tobacco abuse.  PAST SURGICAL HISTORY: She  has a past surgical history that includes Cesarean section (1996); Esophagogastroduodenoscopy (N/A, 05/21/2014); Esophagogastroduodenoscopy (N/A, 03/02/2015); and Colonoscopy (N/A, 03/02/2015).  Allergies  Allergen Reactions  . Iohexol Other (See Comments)    "severe burning" Patient has received Contrast in 2005 with 13 hour pre-medication, and had no reaction at that time  . Spiriva Handihaler [Tiotropium Bromide Monohydrate] Nausea And Vomiting  . Iodinated Diagnostic Agents Other (See Comments)    Feels like she is "on fire" for months afterwards, if exposed  . Ativan [Lorazepam] Other (See Comments)    Hallucinations/confusion/"swatting flies"  . Penicillins Hives    Has had cephalosporins Has patient had a PCN reaction causing immediate rash, facial/tongue/throat swelling, SOB or lightheadedness with hypotension: Yes Has patient had a PCN reaction causing severe rash involving mucus membranes or skin necrosis: Yes Has patient had a PCN reaction that required hospitalization: Yes Has patient had a PCN reaction occurring within the last 10 years: No If all of the above answers are "NO", then may proceed with Cephalosporin use.    No current facility-administered medications on file prior to encounter.    Current Outpatient Prescriptions on File Prior to Encounter  Medication Sig  .  apixaban (ELIQUIS) 5 MG TABS tablet Take 1 tablet (5 mg total) by mouth 2 (two) times daily.  . calcium carbonate (OS-CAL - DOSED IN MG OF ELEMENTAL CALCIUM) 1250 (500  CA) MG tablet Take 1 tablet (500 mg of elemental calcium total) by mouth daily with breakfast.  . hydrocortisone (ANUSOL-HC) 2.5 % rectal cream Apply topically 4 (four) times daily. (Patient taking differently: Apply 1 application topically 4 (four) times daily. )  . HYDROmorphone (DILAUDID) 4 MG tablet Take 8 mg by mouth every 4 (four) hours as needed for severe pain.  Marland Kitchen levothyroxine (SYNTHROID, LEVOTHROID) 25 MCG tablet Take 1 tablet (25 mcg total) by mouth daily before breakfast.  . magnesium oxide (MAG-OX) 400 MG tablet Take 1 tablet (400 mg total) by mouth 2 (two) times daily.  Marland Kitchen thiamine (VITAMIN B-1) 100 MG tablet Take 1 tablet (100 mg total) by mouth daily.  . traZODone (DESYREL) 25 mg TABS tablet Take 0.5 tablets (25 mg total) by mouth at bedtime as needed for sleep.  Marland Kitchen venlafaxine (EFFEXOR) 25 MG tablet Take 1 tablet (25 mg total) by mouth 2 (two) times daily.  . feeding supplement, ENSURE ENLIVE, (ENSURE ENLIVE) LIQD Take 237 mLs by mouth 3 (three) times daily between meals.  . potassium chloride (MICRO-K) 10 MEQ CR capsule Take 20 mEq by mouth 2 (two) times daily.    FAMILY HISTORY:  Her indicated that her mother is deceased. She indicated that her father is deceased.    SOCIAL HISTORY: She  reports that she has been smoking Cigarettes.  She has a 9.25 pack-year smoking history. She has never used smokeless tobacco. She reports that she does not drink alcohol or use drugs.  REVIEW OF SYSTEMS:   As mentioned.  Anorexia, poor by mouth intake, generalized weakness, not really walking. Denies cough, dyspnea, chest pain, fevers, chills, diarrhea.   SUBJECTIVE:  As mentioned.   VITAL SIGNS: BP 101/74 (BP Location: Right Arm)   Pulse (!) 126   Temp (!) 95 F (35 C) (Rectal)   Resp 26   LMP 10/01/2010 Comment: Has depro  SpO2 100%   HEMODYNAMICS:    VENTILATOR SETTINGS:    INTAKE / OUTPUT: I/O last 3 completed shifts: In: 1050 [IV Piggyback:1050] Out: -   PHYSICAL  EXAMINATION: General:  Cachectic, chronically ill, contracted. Neuro:  CN grossly intact.  (-) lateralizing signs HEENT:  Icteric sclerae, (-) NVD.  Dry oral mucosa. (-) oral thrush.  Cardiovascular:  Good s1/s2. Tachycardic. (-) m/r/g.  Lungs:  Dec BS on BLF. Some crackles at base.  Abdomen:  Dec BS, soft, non tender. (-) masses Musculoskeletal:  Contracted. Grade 1 edema. Cool distal extremities.  Skin:  Warm skin but distally was cool to touch. (-) rash  LABS:  BMET  Recent Labs Lab 11/20/16 1527 11/20/16 1841  NA 151* 150*  K 4.1 3.6  CL 120* 117*  CO2 <7* 9*  BUN 15 13  CREATININE 1.22* 1.05*  GLUCOSE 138* 258*    Electrolytes  Recent Labs Lab 11/20/16 1527 11/20/16 1841  CALCIUM 7.9* 6.8*    CBC  Recent Labs Lab 11/20/16 1527 11/20/16 1719 11/20/16 1841  WBC 6.9 7.5 7.5  HGB 7.5* 7.0* 5.4*  HCT 24.7* 22.7* 16.8*  PLT 18* 151 96*    Coag's No results for input(s): APTT, INR in the last 168 hours.  Sepsis Markers  Recent Labs Lab 11/20/16 1726 11/20/16 1841 11/20/16 1852  LATICACIDVEN 9.86* 9.4* 9.31*    ABG  Recent  Labs Lab 11/20/16 1725  PHART 7.282*  PCO2ART BELOW REPORTABLE RANGE  PO2ART 230*    Liver Enzymes  Recent Labs Lab 11/20/16 1527  AST 239*  ALT 98*  ALKPHOS 129*  BILITOT 1.8*  ALBUMIN 1.8*    Cardiac Enzymes  Recent Labs Lab 11/20/16 1528  TROPONINI 0.12*    Glucose  Recent Labs Lab 11/20/16 1503  GLUCAP 93    Imaging Ct Abdomen Pelvis Wo Contrast  Result Date: 11/20/2016 CLINICAL DATA:  56 y/o F; generalized abdominal pain. Failure to thrive at home. EXAM: CT ABDOMEN AND PELVIS WITHOUT CONTRAST TECHNIQUE: Multidetector CT imaging of the abdomen and pelvis was performed following the standard protocol without IV contrast. COMPARISON:  05/15/2016 CT of abdomen and pelvis. FINDINGS: Lower chest: Stable 6 mm left lower lobe nodule (series 4, image 31). Moderate coronary artery calcification.  Hepatobiliary: Hepatic steatosis. Normal gallbladder. Stable lesion within the dome of the liver with central fluid attenuation, probably a cyst, measuring up to 25 mm. Enlarged common bile duct measuring up to 13 mm, previously 15 mm. Pancreas: Unremarkable. No pancreatic ductal dilatation or surrounding inflammatory changes. Spleen: Normal in size without focal abnormality. Adrenals/Urinary Tract: Bilateral kidney stones. No hydronephrosis. Mildly distended bladder. Stomach/Bowel: Mild diffuse distention of small bowel with collapsed terminal ileum and the right lower quadrant and largely collapsed colon except for large volume of stool in the rectum. Normal appendix. Retained contrast within the colon. Vascular/Lymphatic: Aortic atherosclerosis with severe calcification. Left common iliac vein stent. No enlarged abdominal or pelvic lymph nodes. Reproductive: Uterus and bilateral adnexa are unremarkable. Other: No abdominal wall hernia or abnormality. No abdominopelvic ascites. Musculoskeletal: No acute or significant osseous findings. IMPRESSION: 1. Mild diffuse distention of small bowel and severe gastric distention may represent obstruction or ileus. Collapsed terminal ileum and colon from ascending segment to a sigmoid suggesting possible transition point of the ileum in the right lower quadrant. 2. Large volume of stool in rectum. 3. Stable 6 mm nodule within the left lower lobe of the lung. Follow-up per prior CT abdomen and pelvis study recommendations. 4. Enlarged common bile duct measuring up to 13 mm, previously 15 mm. 5. Hepatic steatosis. 6. Aortic atherosclerosis. 7. Bilateral nonobstructing kidney stones. Electronically Signed   By: Mitzi Hansen M.D.   On: 11/20/2016 16:49   Dg Chest 2 View  Result Date: 11/20/2016 CLINICAL DATA:  Shortness of breath.  Pain . EXAM: CHEST  2 VIEW COMPARISON:  11/06/2016. FINDINGS: Mediastinum and hilar structures are normal. Heart size normal. No focal  infiltrate. No pleural effusion or pneumothorax. Deformity noted of the right posterior sixth rib consistent with old fracture. Rounded soft tissue density noted over the left upper abdomen most likely stool within colon or debris within the stomach. IMPRESSION: No acute cardiopulmonary disease. Electronically Signed   By: Maisie Fus  Register   On: 11/20/2016 14:56   Ct Head Wo Contrast  Result Date: 11/20/2016 CLINICAL DATA:  Pt from home with complaints of generalized pain. Pt is normally in pain at her baseline. Pt is here for failure to thrive from home. Pt lives with many family members. Pt was recently hospitalized for hypoglycemia. Pt cbg for EMS was 68 Pt is tachycardic at this time. Per family pt has had poor appetite for 2 weeks. Pt denies CP. Pt has no cardiac history Pt sates she is having a hard time breathing, but her oxygen saturation is at 100% RA EXAM: CT HEAD WITHOUT CONTRAST TECHNIQUE: Contiguous axial images were obtained  from the base of the skull through the vertex without intravenous contrast. COMPARISON:  08/12/2016 FINDINGS: Brain: No evidence of acute infarction, hemorrhage, hydrocephalus, extra-axial collection or mass lesion/mass effect. The ventricles and sulci are enlarged reflecting mild diffuse atrophy, stable from prior exam. There is mild periventricular white matter hypoattenuation consistent with chronic microvascular ischemic change, also stable. Vascular: No hyperdense vessel or unexpected calcification. Skull: Normal. Negative for fracture or focal lesion. Sinuses/Orbits: Visualize globes and orbits are unremarkable. Visualized sinuses and mastoid air cells are clear. Other: None. IMPRESSION: 1. No acute intracranial abnormalities. 2. Mild atrophy and chronic microvascular ischemic change, stable from the prior study. Electronically Signed   By: Amie Portland M.D.   On: 11/20/2016 16:36     STUDIES:  CT head 2/20 > nonacute CT abd/pelv 2/20 > Mild diffuse distention of  small bowel and severe gastric distention may represent obstruction or ileus. Collapsed terminal ileum and colon from ascending segment to a sigmoid suggesting possible transition point of the ileum in the right lower quadrant. Large volume of stool in rectum. Stable 6 mm nodule within the left lower lobe of the lung. Enlarged common bile duct measuring up to 13 mm, previously 15 mm. Bilateral nonobstructing kidney stones  CULTURES: Blood 2/20 > Urine 2/20 >  MRSA 2/20 >   ANTIBIOTICS: Cefepime 2/20 > Vancomycin 2/20 >  SIGNIFICANT EVENTS: 2/6 >2/7 admit for FTT 2/20 admit  LINES/TUBES: CVL 2/20 >  DISCUSSION: 45 female, recent admission for malnutrition, failure to thrive, UTI, admitted after being home for 2 weeks with failure to thrive again, confusion, hypotension, lactic acidosis. Per EMS, patient has "family" but no one really is taking care of her allegedly. She is bedbound. The last time she had a meal was a week ago.   ASSESSMENT / PLAN:  PULMONARY A: At risk for healthcare associated pneumonia. Chest x-ray with no infiltrates. At risk for pulmonary edema. She was volume resuscitated.  Hypoxemia likely related to hypoperfusion/lactic acidosis/hypovolemia H/o pulmonary embolism P:   She was on nonrebreather when I saw her. Her O2 saturation was not picking up as she was cool and hypothermic. Plan to get ABG. Titrate FiO2 to keep her O2 sats more than 88%. Currently protecting her airway and not in respiratory distress. Observe respiratory status with blood transfusion. She has got and 3.5 L of fluids. May go into pulmonary congestion. Holding off on OAC/heparin 2/2 anemia   CARDIOVASCULAR A:  Hypotension related to hypovolemia. Differential is septic shock from occult infection Tachycardia related to hypovolemia.  P:  By the time I saw her, her BP was acceptable.  She has gotten 3.5 L IVF and 2 u pRBC has been ordered.  Keep bicarbonate drip running at 100 mls/hr.   Abx to cover for possible infection.    RENAL A:   AKI, 2/2 hypovolemia Severe metabolic acidosis Hypernatremia and Hyperchloremia P:   As mentioned above. Cont with bicarbonate drip at 100 mls an hour. WOF congestion.  Getting BMP q 4 >> may need bicarbonate pushes if bicarbonate remains low Check ABG.  GASTROINTESTINAL A:   Transaminits 2/2 Severe malnutrition Severe malnutrition Concern for Drug overdose with APAP GERD Crohns dse P:   Trend LFTs.  Check APAP and salicylate levels Keep NPO for now.  WOF refeeding syndrome.  Nutrition consult in am.  HEMATOLOGIC A:   Anemia.  No obvious source of bleeding.  P:  Transfuse 2 u pRBC slowly. Watch out for congestion. She was supposed to be  on blood thinners for pulmonary embolism but according to the patient, she has not been taking that medicine.  INFECTIOUS A:   Recent UTI. (Klebsiella pna) P:   Trend lactic acid Cont cefepime and vancomycin for now Panculture. Deescalate once with cultures.   ENDOCRINE A:   DM P:   SSI and CBG q 4hrs  NEUROLOGIC A:   Chronic pain Concern for Substance abuse P:   RASS goal: 0 Per EMS, she has only been taking Dilaudid and Tylenol. Holding off on further opiates for now.  She looked comfortable when I saw her. If she needs pain medicines, low-dose fentanyl IV may work Check drug screen etoh level, apap level, salicylate level.    FAMILY  - Updates: No family at bedside. I do not even think that she has family to begin with. Patient will need social services consult in the morning. She needs to be placed in a nursing home once discharged.  I am not sure if she needs APS. Patient denies being abused.   - Inter-disciplinary family meet or Palliative Care meeting due by:  11/27/16    I spent  35 minutes of Critical Care time with this patient today. Admit patient to the ICU.   Pollie Meyer, MD 11/20/2016, 11:21 PM [facility] Pager [phone]  [phone] After 3 pm or if no answer, call [phone]

## 2016-11-20 NOTE — ED Provider Notes (Signed)
Hand-off from [employer], PA-C.   Patient is a 56 year old female with history of diabetes, hypertension, hypothyroidism, paroxysmal A. fib, DVT on Apixaban, GERD, anemia, Crohn's and chronic pain who presents to the ED via EMS from home with complaints of generalized pain, decreased activity and decreased PO intake. EMS reports that patient's caregiver at home called for her to come to the ED due to concern for failure to thrive. Patient reports having pain all over. Patient states she has had a poor appetite for the past few weeks and she was discharged from her most recent hospitalization for hypoglycemia. Patient's caregiver reports that patient has poor care from her family at home.  Physical Exam  BP 105/81   Pulse (!) 144   Temp (!) 96.1 F (35.6 C) (Rectal) Comment: EDP aware  Resp (!) 42   LMP 10/01/2010 Comment: Has depro  SpO2 90%   Physical Exam  Constitutional: She is oriented to person, place, and time. No distress.  Thin, frail, pale, cachetic appearing female.   HENT:  Head: Normocephalic and atraumatic.  Mouth/Throat: Uvula is midline and oropharynx is clear and moist. Mucous membranes are dry. No oropharyngeal exudate, posterior oropharyngeal edema, posterior oropharyngeal erythema or tonsillar abscesses. No tonsillar exudate.  Eyes: Conjunctivae and EOM are normal. Right eye exhibits no discharge. Left eye exhibits no discharge. No scleral icterus.  Neck: Normal range of motion. Neck supple.  Cardiovascular: Regular rhythm, normal heart sounds and intact distal pulses.   Tachycardic, HR 120. Irregularly irregular rhythm.   Pulmonary/Chest: Effort normal and breath sounds normal. No respiratory distress. She has no wheezes. She has no rales. She exhibits no tenderness.  Abdominal: Soft. Bowel sounds are normal. She exhibits no distension and no mass. There is tenderness. There is no rebound and no guarding. No hernia.  Musculoskeletal: Normal range of motion. She  exhibits tenderness. She exhibits no edema.  Pt reports diffuse tenderness to her entire body with light palpation.  Neurological: She is alert and oriented to person, place, and time. She has normal strength. No sensory deficit.  Skin: Skin is warm and dry. She is not diaphoretic.  Nursing note and vitals reviewed.   ED Course  .Critical Care Performed by: Nona Dell Authorized by: Nona Dell   Critical care provider statement:    Critical care time (minutes):  40   Critical care was necessary to treat or prevent imminent or life-threatening deterioration of the following conditions:  Metabolic crisis   Critical care was time spent personally by me on the following activities:  Blood draw for specimens, discussions with consultants, discussions with primary provider, evaluation of patient's response to treatment, examination of patient, interpretation of cardiac output measurements, obtaining history from patient or surrogate, ordering and performing treatments and interventions, ordering and review of laboratory studies, ordering and review of radiographic studies, pulse oximetry, re-evaluation of patient's condition, review of old charts and vascular access procedures    MDM Patient presented to the ED with complaint of failure to thrive from home. Patient was recently discharged from the hospital 2 weeks ago for hypoglycemia and has had decreased activity and by mouth intake since being discharged home as reported by home health nurse. Patient complaining of pain all over. Initial vitals revealed rectal temp 96.1, heart rate 120, O2 saturation 88-92% on 5 L nasal cannula. On exam patient is a frail, thin cachectic appearing female. She reports pain all over with light palpation. Lungs clear to auscultation bilaterally. Patient alert and  oriented 3. Sensation grossly intact. Patient spontaneously moving all 4 extremities with 5 out of 5 strength. Labs revealed  hemoglobin 7.5, Na 151, Cl 120, Co2 <7, Cr 1.22, AST 239, ALT 98, Alk phos 129, Trop 0.12, lactic 9.86, positive hemoccult. Initial rectal exam performed by initial provider. Rectal exam showed no active bleeding. Femoral line placed by Dr. Laneta Simmers for IV access. Pt started on IVF, given 2amp of bicarb and started on bicarb infusion. ABG showed pH 7.26, CO2 undetectable. Discussed pt with Dr. Laneta Simmers. Pt with severe metabolic acidosis with evidence of end organ damage, signs of active GI bleed with decreased hgb and signs of ileus on CT abdomen. Critical care reports the will see the pt but advised to admit pt to hospitalist service. Consulted hospitalist. Dr. Lorin Mercy agrees to admission.         Chesley Noon [place], [place] 11/20/16 2245    Leo Grosser, MD 11/22/16 [phone]

## 2016-11-21 ENCOUNTER — Inpatient Hospital Stay (HOSPITAL_COMMUNITY): Payer: Medicare Other

## 2016-11-21 ENCOUNTER — Encounter (HOSPITAL_COMMUNITY): Payer: Self-pay | Admitting: Emergency Medicine

## 2016-11-21 DIAGNOSIS — E860 Dehydration: Secondary | ICD-10-CM

## 2016-11-21 DIAGNOSIS — R7989 Other specified abnormal findings of blood chemistry: Secondary | ICD-10-CM

## 2016-11-21 LAB — BASIC METABOLIC PANEL
ANION GAP: 22 — AB (ref 5–15)
ANION GAP: 12 (ref 5–15)
Anion gap: 11 (ref 5–15)
Anion gap: 9 (ref 5–15)
BUN: 12 mg/dL (ref 6–20)
BUN: 10 mg/dL (ref 6–20)
BUN: 10 mg/dL (ref 6–20)
BUN: 9 mg/dL (ref 6–20)
CALCIUM: 6.2 mg/dL — AB (ref 8.9–10.3)
CHLORIDE: 118 mmol/L — AB (ref 101–111)
CHLORIDE: 114 mmol/L — AB (ref 101–111)
CO2: 11 mmol/L — AB (ref 22–32)
CO2: 24 mmol/L (ref 22–32)
CO2: 27 mmol/L (ref 22–32)
CO2: 32 mmol/L (ref 22–32)
CREATININE: 0.69 mg/dL (ref 0.44–1.00)
CREATININE: 0.69 mg/dL (ref 0.44–1.00)
Calcium: 6.6 mg/dL — ABNORMAL LOW (ref 8.9–10.3)
Calcium: 6.2 mg/dL — CL (ref 8.9–10.3)
Calcium: 6.1 mg/dL — CL (ref 8.9–10.3)
Chloride: 117 mmol/L — ABNORMAL HIGH (ref 101–111)
Chloride: 109 mmol/L (ref 101–111)
Creatinine, Ser: 1.05 mg/dL — ABNORMAL HIGH (ref 0.44–1.00)
Creatinine, Ser: 0.74 mg/dL (ref 0.44–1.00)
GFR calc Af Amer: >60 mL/min (ref >60)
GFR calc non Af Amer: 57 mL/min — ABNORMAL LOW (ref >60)
GFR calc non Af Amer: >60 mL/min (ref >60)
GFR calc non Af Amer: >60 mL/min (ref >60)
GFR calc non Af Amer: >60 mL/min (ref >60)
GLUCOSE: 250 mg/dL — AB (ref 65–99)
GLUCOSE: 174 mg/dL — AB (ref 65–99)
GLUCOSE: 225 mg/dL — AB (ref 65–99)
Glucose, Bld: 143 mg/dL — ABNORMAL HIGH (ref 65–99)
POTASSIUM: 3 mmol/L — AB (ref 3.5–5.1)
POTASSIUM: 2.7 mmol/L — AB (ref 3.5–5.1)
Potassium: 2.4 mmol/L — CL (ref 3.5–5.1)
Potassium: 3.2 mmol/L — ABNORMAL LOW (ref 3.5–5.1)
Sodium: 151 mmol/L — ABNORMAL HIGH (ref 135–145)
Sodium: 153 mmol/L — ABNORMAL HIGH (ref 135–145)
Sodium: 152 mmol/L — ABNORMAL HIGH (ref 135–145)
Sodium: 150 mmol/L — ABNORMAL HIGH (ref 135–145)

## 2016-11-21 LAB — VITAMIN B12: Vitamin B-12: 937 pg/mL — ABNORMAL HIGH (ref 180–914)

## 2016-11-21 LAB — CBC WITH DIFFERENTIAL/PLATELET
BASOS PCT: 0 %
Basophils Absolute: 0 10*3/uL (ref 0.0–0.1)
EOS PCT: 0 %
Eosinophils Absolute: 0 10*3/uL (ref 0.0–0.7)
HCT: 27.3 % — ABNORMAL LOW (ref 36.0–46.0)
Hemoglobin: 9.3 g/dL — ABNORMAL LOW (ref 12.0–15.0)
LYMPHS ABS: 2.6 10*3/uL (ref 0.7–4.0)
Lymphocytes Relative: 28 %
MCH: 29.8 pg (ref 26.0–34.0)
MCHC: 34.1 g/dL (ref 30.0–36.0)
MCV: 87.5 fL (ref 78.0–100.0)
MONO ABS: 0.5 10*3/uL (ref 0.1–1.0)
Monocytes Relative: 5 %
Neutro Abs: 6.1 10*3/uL (ref 1.7–7.7)
Neutrophils Relative %: 67 %
PLATELETS: 71 10*3/uL — AB (ref 150–400)
RBC: 3.12 MIL/uL — ABNORMAL LOW (ref 3.87–5.11)
RDW: 19.7 % — AB (ref 11.5–15.5)
WBC: 9.2 10*3/uL (ref 4.0–10.5)

## 2016-11-21 LAB — BLOOD GAS, VENOUS
ACID-BASE DEFICIT: 12.9 mmol/L — AB (ref 0.0–2.0)
Bicarbonate: 11.5 mmol/L — ABNORMAL LOW (ref 20.0–28.0)
Drawn by: 308601
O2 Content: 3 L/min
O2 SAT: 39.8 %
PCO2 VEN: 20.2 mmHg — AB (ref 44.0–60.0)
Patient temperature: 96.5
pH, Ven: 7.367 (ref 7.250–7.430)

## 2016-11-21 LAB — HEPATIC FUNCTION PANEL
ALBUMIN: 1.7 g/dL — AB (ref 3.5–5.0)
ALT: 54 U/L (ref 14–54)
AST: 87 U/L — ABNORMAL HIGH (ref 15–41)
Alkaline Phosphatase: 77 U/L (ref 38–126)
BILIRUBIN INDIRECT: 0.9 mg/dL (ref 0.3–0.9)
BILIRUBIN TOTAL: 1.5 mg/dL — AB (ref 0.3–1.2)
Bilirubin, Direct: 0.6 mg/dL — ABNORMAL HIGH (ref 0.1–0.5)
Total Protein: 3.6 g/dL — ABNORMAL LOW (ref 6.5–8.1)

## 2016-11-21 LAB — DIC (DISSEMINATED INTRAVASCULAR COAGULATION)PANEL
Smear Review: NONE SEEN
aPTT: 37 seconds — ABNORMAL HIGH (ref 24–36)

## 2016-11-21 LAB — PROCALCITONIN: PROCALCITONIN: 9.31 ng/mL

## 2016-11-21 LAB — RAPID URINE DRUG SCREEN, HOSP PERFORMED
Amphetamines: NOT DETECTED
BARBITURATES: NOT DETECTED
BENZODIAZEPINES: NOT DETECTED
COCAINE: NOT DETECTED
Opiates: POSITIVE — AB
TETRAHYDROCANNABINOL: NOT DETECTED

## 2016-11-21 LAB — DIC (DISSEMINATED INTRAVASCULAR COAGULATION) PANEL
D DIMER QUANT: 0.39 ug{FEU}/mL (ref 0.00–0.50)
FIBRINOGEN: 89 mg/dL — AB (ref 210–475)
INR: 2.01
PLATELETS: 100 10*3/uL — AB (ref 150–400)
PROTHROMBIN TIME: 23.1 s — AB (ref 11.4–15.2)

## 2016-11-21 LAB — PHOSPHORUS: Phosphorus: 1.5 mg/dL — ABNORMAL LOW (ref 2.5–4.6)

## 2016-11-21 LAB — PREPARE RBC (CROSSMATCH)

## 2016-11-21 LAB — IRON AND TIBC: IRON: 52 ug/dL (ref 28–170)

## 2016-11-21 LAB — MAGNESIUM: MAGNESIUM: 1.1 mg/dL — AB (ref 1.7–2.4)

## 2016-11-21 LAB — RETICULOCYTES
RBC.: 1.86 MIL/uL — ABNORMAL LOW (ref 3.87–5.11)
RETIC CT PCT: 2.6 % (ref 0.4–3.1)
Retic Count, Absolute: 48.4 10*3/uL (ref 19.0–186.0)

## 2016-11-21 LAB — LACTIC ACID, PLASMA
LACTIC ACID, VENOUS: 7.7 mmol/L — AB (ref 0.5–1.9)
Lactic Acid, Venous: 5.7 mmol/L (ref 0.5–1.9)

## 2016-11-21 LAB — ACETAMINOPHEN LEVEL

## 2016-11-21 LAB — TROPONIN I
TROPONIN I: 0.13 ng/mL — AB (ref <0.03)
TROPONIN I: 0.23 ng/mL — AB (ref <0.03)

## 2016-11-21 LAB — SALICYLATE LEVEL: Salicylate Lvl: <7 mg/dL (ref 2.8–30.0)

## 2016-11-21 LAB — ETHANOL: Alcohol, Ethyl (B): <5 mg/dL (ref <5)

## 2016-11-21 LAB — FERRITIN: FERRITIN: 326 ng/mL — AB (ref 11–307)

## 2016-11-21 LAB — LACTATE DEHYDROGENASE: LDH: 211 U/L — AB (ref 98–192)

## 2016-11-21 LAB — FOLATE: Folate: 9.2 ng/mL (ref >5.9)

## 2016-11-21 MED ORDER — CHLORHEXIDINE GLUCONATE CLOTH 2 % EX PADS
6.0000 | MEDICATED_PAD | Freq: Every day | CUTANEOUS | Status: DC
Start: 2016-11-21 — End: 2016-11-23
  Administered 2016-11-22 – 2016-11-23 (×2): 6 via TOPICAL

## 2016-11-21 MED ORDER — ENSURE ENLIVE PO LIQD
237.0000 mL | Freq: Three times a day (TID) | ORAL | Status: DC
  Administered 2016-11-21 (×2): 237 mL via ORAL

## 2016-11-21 MED ORDER — LIP MEDEX EX OINT
TOPICAL_OINTMENT | CUTANEOUS | Status: AC
  Administered 2016-11-21: 12:00:00
  Filled 2016-11-21: qty 7

## 2016-11-21 MED ORDER — SODIUM CHLORIDE 0.9 % IV SOLN
Freq: Once | INTRAVENOUS | Status: AC
  Administered 2016-11-21: 04:00:00 via INTRAVENOUS

## 2016-11-21 MED ORDER — SODIUM CHLORIDE 0.9 % IV SOLN
30.0000 meq | Freq: Four times a day (QID) | INTRAVENOUS | Status: AC
  Administered 2016-11-21 (×2): 30 meq via INTRAVENOUS
  Filled 2016-11-21 (×2): qty 15

## 2016-11-21 MED ORDER — ADULT MULTIVITAMIN W/MINERALS CH
1.0000 | ORAL_TABLET | Freq: Every day | ORAL | Status: DC
  Administered 2016-11-22 – 2016-11-23 (×2): 1 via ORAL
  Filled 2016-11-21 (×2): qty 1

## 2016-11-21 MED ORDER — SODIUM CHLORIDE 0.9% FLUSH: 10.0000 mL | INTRAVENOUS | Status: DC | PRN

## 2016-11-21 MED ORDER — SODIUM CHLORIDE 0.9 % IV SOLN
30.0000 meq | Freq: Once | INTRAVENOUS | Status: AC
  Administered 2016-11-21: 30 meq via INTRAVENOUS
  Filled 2016-11-21: qty 15

## 2016-11-21 MED ORDER — CHLORHEXIDINE GLUCONATE CLOTH 2 % EX PADS
6.0000 | MEDICATED_PAD | Freq: Every day | CUTANEOUS | Status: DC
  Administered 2016-11-21 – 2016-11-22 (×2): 6 via TOPICAL

## 2016-11-21 NOTE — Care Management Note (Signed)
Case Management Note  Patient Details  Name: [name] MRN: [id number] Date of Birth: 08-03-1961  Subjective/Objective:     Failure to thrive with malnutrition, abnormal electrolytes, and transaminitis.               Action/Plan:  Followed by medicare Date:  November 21, 2016 Chart reviewed for concurrent status and case management needs. Will continue to follow patient progress. Discharge Planning: following for needs/ has a hha in place but is not aware of the agency. Expected discharge date: [id number] Marcelle Smiling, BSN, [place], [place]   [phone] Expected Discharge Date:   (unknown)               Expected Discharge Plan:  Home w Home Health Services  In-House Referral:     Discharge planning Services     Post Acute Care Choice:    Choice offered to:  NA  DME Arranged:    DME Agency:     HH Arranged:  Nurse's Aide HH Agency:     Status of Service:  In process, will continue to follow  If discussed at Long Length of Stay Meetings, dates discussed:    Additional Comments:  Golda Acre, RN 11/21/2016, 9:03 AM

## 2016-11-21 NOTE — Consult Note (Signed)
WOC Nurse wound consult note Reason for Consult:Moisture associated skin damage, specifically incontinence associated dermatitis (IAD) Wound type:Moisture (MASD), IAD Pressure Injury POA: Areas of skin breakdown secondary to incontinence were POC, this is not a pressure injury Measurement:4cm x 4cm area with partial thickness tissue loss, shallow areas measure less than 0.1cm Wound DHW:YSHU, moist Drainage (amount, consistency, odor) scant serous Periwound:intact Dressing procedure/placement/frequency: Patient is experiencing frequent loose stools and because of that, we will focus on moisture barrier cream application, timely incontinence care and turning and repositioning to treat the IAD. A prophylactic sacral dressing is in place and patient is on a mattress replacement with low air loss feature. WOC nursing team will not follow, but will remain available to this patient, the nursing and medical teams.  Please re-consult if needed. Thanks, Ladona Mow, MSN, RN, GNP, [place]  Pager# [phone]

## 2016-11-21 NOTE — Progress Notes (Signed)
CRITICAL VALUE ALERT  Critical value received:    Lactic Acid-5.7 Potassium-2.7 Calcium-6.2   Date of notification: 11/21/16  Time of notification: 1300  Critical value read back:Yes.    Nurse who received alert:  Georg Ruddle      MD notified (1st page):  Christene Slates   Time of first page:  1300  MD notified (2nd page):  Time of second page:  Responding MD:    Time MD responded:

## 2016-11-21 NOTE — Progress Notes (Signed)
eLink Physician-Brief Progress Note Patient Name: [name] DOB: 1961-03-15 MRN: [id number]   Date of Service  11/21/2016  HPI/Events of Note  Multiple issues: 1. Lactic Acid = 9.3 >> 7.7, 2. Fibrogen = 89 3. Initial pH = 7.28 and pCO2 too low to be measured and 4. K+ = 3.0 and Creatinine = 1.05.  eICU Interventions  Will order: 1. Replace K+ 2. Transfuse 2 units FFP. 3. ABG now.   Patient is also getting PRBC which should help clear the lactic acid.      Intervention Category Major Interventions: Acid-Base disturbance - evaluation and management;Electrolyte abnormality - evaluation and management  Loann Chahal [place] 11/21/2016, 1:09 AM

## 2016-11-21 NOTE — Progress Notes (Signed)
Initial Nutrition Assessment  DOCUMENTATION CODES:   Severe malnutrition in context of chronic illness, Underweight; Likely also a social/environmental component to malnutrition  INTERVENTION:  Monitor magnesium, potassium, and phosphorus daily for at least 3 days, MD to replete as needed, as pt is at risk for refeeding syndrome given severe malnutrition, unknown last PO intake.  Recommend advancing patient to Carbohydrate Modified diet. Patient requesting fruit.  Provide Ensure Enlive po TID, each supplement provides 350 kcal and 20 grams of protein.   Provide multivitamin with minerals daily.  NUTRITION DIAGNOSIS:   Malnutrition (Severe) related to chronic illness, social / environmental circumstances (Crohn's disease, pancreatitis, DM) as evidenced by severe depletion of body fat, severe depletion of muscle mass, 30.6 percent weight loss over 8 months.  GOAL:   Patient will meet greater than or equal to 90% of their needs  MONITOR:   PO intake, Supplement acceptance, Labs, I & O's, Weight trends, Skin  REASON FOR ASSESSMENT:   Malnutrition Screening Tool, Consult Poor PO  ASSESSMENT:   56 year old female with PMHx of DM, HTN, Crohn's disease, GERD, HLD, Pancreatitis, DVT presents with weakness and minimal PO intake, found to have severe lactic acidosis.    -Per chart CT abd/pelv showing diffuse distention of small bowel and severe gastric distention may represent obstruction or ileus.   Spoke with patient at bedside. Patient very uncomfortable and unable to participate fully in assessment reporting she does not want to talk now. She reported she was hungry and wanted a fruit cup. Patient reports she cannot remember when her last meal was. Per chart patient has not eaten for a week. She did report that when she eats at home it is typically fruit.   Patient reports she has been losing weight, but became frustrated and did not provide usual body weight or weight history. Per  chart patient was 108 lbs on 03/05/2016. She has lost 33 lbs (30.6% body weight) over 8 months, which is significant for time frame.   Medications reviewed and include: famotidine 20 mg daily, vancomycin, sodium bicarbonate 150 mEq in D5 @ 100 ml/hr (120 grams dextrose, 408 kcal daily).   Labs reviewed: Sodium 151, Potassium 3, Chloride 118, CO2 11, Glucose 250, Creatinine 1.05, Anion gap 22, elevated Troponin.   Nutrition-Focused physical exam completed. Findings are severe fat depletion, severe muscle depletion, and no edema.   Discussed with RN. Patient pending RD assessment prior to diet advancement. Discussed that patient is at high risk for refeeding syndrome. Also discussed with NP. Patient has not had any symptoms of ileus/obstruction so will try patient on diet to see if she tolerates.   Patient's malnutrition likely multifactorial in setting of several chronic illnesses and also social/environmental as it is unknown the level of care patient is receiving at home.   Diet Order:  Diet Carb Modified Fluid consistency: Thin; Room service appropriate? Yes  Skin:  Wound (see comment) (Stg II to sacrum)  Last BM:  11/21/2016  Height:   Ht Readings from Last 1 Encounters:  10/18/16 5\' 5"  (1.651 m)    Weight:   Wt Readings from Last 1 Encounters:  11/21/16 75 lb 13.4 oz (34.4 kg)    Ideal Body Weight:  56.8 kg  BMI:  Body mass index is 12.62 kg/m.  Estimated Nutritional Needs:   Kcal:  1200-1375 (35-40 kcal/kg)  Protein:  52-69 grams (1.5-2 grams/kg)  Fluid:  1.2-1.5 L/day  EDUCATION NEEDS:   Education needs no appropriate at this time  Willey Blade, MS, RD, LDN Pager: [phone] After Hours Pager: [phone]

## 2016-11-21 NOTE — Progress Notes (Signed)
Pt has refused AM ABG, RN aware.  RT to monitor and assess as needed.

## 2016-11-21 NOTE — Progress Notes (Signed)
PULMONARY / CRITICAL CARE MEDICINE   Name: [name] MRN: [id number] DOB: 05-25-1961    ADMISSION DATE:  11/20/2016 CONSULTATION DATE:  11/20/2016  REFERRING MD:  Dr. Clydene Pugh EDP  CHIEF COMPLAINT:  Failure to thrive  BRIEF SUMMARY:  56 y/o F admitted 2/20 with failure to thrive.  The patient was recently discharged on 2/7 with a similar episode - malnutrition, electrolyte disturbance and transaminitis.  She returned home and barely got out of bed, refused her medications and had barely been eating / drinking.  Found to have a severe lactic acidosis despite normotension.  Admitted for evaluation by PCCM.    PMH:  HTN, hypothyroidism, PAF on apixaban, chronic pain   SUBJECTIVE:  Pt requesting fruit to eat.  Reports feeling cold.  No other acute complaints.  RN reports pt received 2 units PRBC's and is pending 1 unit of FFP.   VITAL SIGNS: BP 98/74   Pulse (!) 127   Temp 99 F (37.2 C) (Core (Comment))   Resp 18   Wt 75 lb 13.4 oz (34.4 kg)   LMP 10/01/2010 Comment: Has depro  SpO2 100%   BMI 12.62 kg/m   HEMODYNAMICS:    VENTILATOR SETTINGS:    INTAKE / OUTPUT: I/O last 3 completed shifts: In: 7373.3 [I.V.:3873.3; Blood:735; IV Piggyback:2765] Out: 400 [Urine:400]  PHYSICAL EXAMINATION: General: cachectic female in NAD HEENT: MM pink/dry, no jvd PSY: calm Neuro: Awake, alert, oriented to self, generalized weakness  CV: s1s2 rrr, no m/r/g PULM: even/non-labored, lungs bilaterally clear WU:[id number], non-tender, bsx4 active  Extremities: warm/dry, no edema  Skin: no rashes or lesions   LABS:  BMET  Recent Labs Lab 11/20/16 1527 11/20/16 1841 11/20/16 2328  NA 151* 150* 151*  K 4.1 3.6 3.0*  CL 120* 117* 118*  CO2 <7* 9* 11*  BUN 15 13 12   CREATININE 1.22* 1.05* 1.05*  GLUCOSE 138* 258* 250*    Electrolytes  Recent Labs Lab 11/20/16 1527 11/20/16 1841 11/20/16 2328  CALCIUM 7.9* 6.8* 6.6*    CBC  Recent Labs Lab 11/20/16 1527 11/20/16 1719  11/20/16 1841 11/20/16 2322  WBC 6.9 7.5 7.5  --   HGB 7.5* 7.0* 5.4*  --   HCT 24.7* 22.7* 16.8*  --   PLT 18* 151 96* 100*    Coag's  Recent Labs Lab 11/20/16 2322  APTT 37*  INR 2.01    Sepsis Markers  Recent Labs Lab 11/20/16 1841 11/20/16 1852 11/20/16 2328 11/20/16 2346  LATICACIDVEN 9.4* 9.31* 7.7*  --   PROCALCITON  --   --   --  9.31    ABG  Recent Labs Lab 11/20/16 1725  PHART 7.282*  PCO2ART BELOW REPORTABLE RANGE  PO2ART 230*    Liver Enzymes  Recent Labs Lab 11/20/16 1527  AST 239*  ALT 98*  ALKPHOS 129*  BILITOT 1.8*  ALBUMIN 1.8*    Cardiac Enzymes  Recent Labs Lab 11/20/16 1528 11/20/16 2328  TROPONINI 0.12* 0.13*    Glucose  Recent Labs Lab 11/20/16 1503  GLUCAP 93    Imaging Ct Abdomen Pelvis Wo Contrast  Result Date: 11/20/2016 CLINICAL DATA:  56 y/o F; generalized abdominal pain. Failure to thrive at home. EXAM: CT ABDOMEN AND PELVIS WITHOUT CONTRAST TECHNIQUE: Multidetector CT imaging of the abdomen and pelvis was performed following the standard protocol without IV contrast. COMPARISON:  05/15/2016 CT of abdomen and pelvis. FINDINGS: Lower chest: Stable 6 mm left lower lobe nodule (series 4, image 31). Moderate coronary artery  calcification. Hepatobiliary: Hepatic steatosis. Normal gallbladder. Stable lesion within the dome of the liver with central fluid attenuation, probably a cyst, measuring up to 25 mm. Enlarged common bile duct measuring up to 13 mm, previously 15 mm. Pancreas: Unremarkable. No pancreatic ductal dilatation or surrounding inflammatory changes. Spleen: Normal in size without focal abnormality. Adrenals/Urinary Tract: Bilateral kidney stones. No hydronephrosis. Mildly distended bladder. Stomach/Bowel: Mild diffuse distention of small bowel with collapsed terminal ileum and the right lower quadrant and largely collapsed colon except for large volume of stool in the rectum. Normal appendix. Retained  contrast within the colon. Vascular/Lymphatic: Aortic atherosclerosis with severe calcification. Left common iliac vein stent. No enlarged abdominal or pelvic lymph nodes. Reproductive: Uterus and bilateral adnexa are unremarkable. Other: No abdominal wall hernia or abnormality. No abdominopelvic ascites. Musculoskeletal: No acute or significant osseous findings. IMPRESSION: 1. Mild diffuse distention of small bowel and severe gastric distention may represent obstruction or ileus. Collapsed terminal ileum and colon from ascending segment to a sigmoid suggesting possible transition point of the ileum in the right lower quadrant. 2. Large volume of stool in rectum. 3. Stable 6 mm nodule within the left lower lobe of the lung. Follow-up per prior CT abdomen and pelvis study recommendations. 4. Enlarged common bile duct measuring up to 13 mm, previously 15 mm. 5. Hepatic steatosis. 6. Aortic atherosclerosis. 7. Bilateral nonobstructing kidney stones. Electronically Signed   By: Mitzi Hansen M.D.   On: 11/20/2016 16:49   Dg Chest 2 View  Result Date: 11/20/2016 CLINICAL DATA:  Shortness of breath.  Pain . EXAM: CHEST  2 VIEW COMPARISON:  11/06/2016. FINDINGS: Mediastinum and hilar structures are normal. Heart size normal. No focal infiltrate. No pleural effusion or pneumothorax. Deformity noted of the right posterior sixth rib consistent with old fracture. Rounded soft tissue density noted over the left upper abdomen most likely stool within colon or debris within the stomach. IMPRESSION: No acute cardiopulmonary disease. Electronically Signed   By: Maisie Fus  Register   On: 11/20/2016 14:56   Ct Head Wo Contrast  Result Date: 11/20/2016 CLINICAL DATA:  Pt from home with complaints of generalized pain. Pt is normally in pain at her baseline. Pt is here for failure to thrive from home. Pt lives with many family members. Pt was recently hospitalized for hypoglycemia. Pt cbg for EMS was 66 Pt is tachycardic  at this time. Per family pt has had poor appetite for 2 weeks. Pt denies CP. Pt has no cardiac history Pt sates she is having a hard time breathing, but her oxygen saturation is at 100% RA EXAM: CT HEAD WITHOUT CONTRAST TECHNIQUE: Contiguous axial images were obtained from the base of the skull through the vertex without intravenous contrast. COMPARISON:  08/12/2016 FINDINGS: Brain: No evidence of acute infarction, hemorrhage, hydrocephalus, extra-axial collection or mass lesion/mass effect. The ventricles and sulci are enlarged reflecting mild diffuse atrophy, stable from prior exam. There is mild periventricular white matter hypoattenuation consistent with chronic microvascular ischemic change, also stable. Vascular: No hyperdense vessel or unexpected calcification. Skull: Normal. Negative for fracture or focal lesion. Sinuses/Orbits: Visualize globes and orbits are unremarkable. Visualized sinuses and mastoid air cells are clear. Other: None. IMPRESSION: 1. No acute intracranial abnormalities. 2. Mild atrophy and chronic microvascular ischemic change, stable from the prior study. Electronically Signed   By: Amie Portland M.D.   On: 11/20/2016 16:36   Dg Chest Port 1 View  Result Date: 11/21/2016 CLINICAL DATA:  56 year old female with dyspnea. Subsequent encounter. EXAM:  PORTABLE CHEST 1 VIEW COMPARISON:  11/20/2016, 11/06/2016 and 03/29/2015. FINDINGS: Question nodule right midlung. No segmental consolidation, pulmonary edema or pneumothorax. Slightly tortuous aorta. Heart size within normal limits. Gas-filled stomach and colon. No acute osseous abnormality. IMPRESSION: Question nodule mid right lung. This can be assessed on follow-up with cardiac leads removed. No infiltrate or congestive heart failure. Electronically Signed   By: Lacy Duverney M.D.   On: 11/21/2016 06:57     STUDIES:  CT head 2/20 > nonacute CT abd/pelv 2/20 > Mild diffuse distention of small bowel and severe gastric distention may  represent obstruction or ileus. Collapsed terminal ileum and colon from ascending segment to a sigmoid suggesting possible transition point of the ileum in the right lower quadrant. Large volume of stool in rectum. Stable 6 mm nodule within the left lower lobe of the lung. Enlarged common bile duct measuring up to 13 mm, previously 15 mm. Bilateral nonobstructing kidney stones  CULTURES: Blood 2/20 > Urine 2/20 >  MRSA 2/20 >   ANTIBIOTICS: Cefepime 2/20 > Vancomycin 2/20 >  SIGNIFICANT EVENTS: 2/6 - 2/7 admit for FTT 2/20 Admit for FTT  LINES/TUBES: R Fem CVL 2/20 >  DISCUSSION: 27 female, recent admission for malnutrition, failure to thrive, UTI, admitted after being home for 2 weeks with failure to thrive again, confusion, hypotension, lactic acidosis. Per EMS, patient has "family" but no one really is taking care of her allegedly. She is bedbound. The last time she had a meal was a week ago.   ASSESSMENT / PLAN:  PULMONARY A: At risk for pulmonary edema - in setting of volume resuscitation.   Hypoxemia likely related to hypoperfusion/lactic acidosis/hypovolemia H/o pulmonary embolism P:   Pulmonary hygiene - IS, mobilize as able  O2 as needed to support sats > 92% Trend intermittent CXR Hold heparin infusion / anticoagulation due to anemia for now  CARDIOVASCULAR A:  Hypotension - secondary to hypovolemia, ddx includes sepsis / infection  Tachycardia - in setting of hypovolemia.  Severe lactic acidosis likely 2/2 hypoperfusion, R/O infection.  P:  Monitor BP in SDU  Volume resuscitation with PRBC's Telemetry monitoring  See Renal  RENAL A:   AKI - in setting of hypovolemia Severe metabolic acidosis Hypernatremia and Hyperchloremia Hypokalemia Severe lactic acidosis likely 2/2 hypoperfusion P:   Trend BMP / UOP  Replace electrolytes as indicated  Continue Bicarbonate gtt for now, CO2 on BMP 11 > improved, consider d/c Repeat lactic acid    GASTROINTESTINAL A:   Transaminits - in setting of severe malnutrition Severe malnutrition Diffuse Distention of Small Bowel / Gastric Distention - on admit CT, asymptomatic Concern for Drug overdose with APAP - negative levels on admit GERD Crohns disease P:   Trend LFT's  Begin diet as tolerated > monitor closely for tolerance  Nutrition consult  Concern for refeeding syndrome > will defer diet to Nutrition Pepcid for SUP  HEMATOLOGIC A:   Anemia.  No obvious source of bleeding.  Hx PE - supposed to be on blood thinner but has not been taking P:  Transfuse per ICU guidelines  Monitor for bleeding Hold anticoagulation for now Trend CBC, follow up post transfusion  INFECTIOUS A:   Recent UTI. (Klebsiella pna) Shock - secondary to hypovolemia, R/O septic from occult infection Lactic acidosis likely 2/2 hypoperfusion, R/O occult infection Sacral pressure ulcer P:   Trend lactic acid  Continue cefepime / vancomycin, D2/x  Narrow abx as able  PCT protocol  Follow cultures as above  WOC  Assess HIV, RPR with failure to thrive  ENDOCRINE A:   DM P:   CBG Q4 with SSI   NEUROLOGIC A:   Chronic pain - had only been taking dilaudid / tylenol at home.  No other medications.  Concern for Substance abuse P:   Monitor / supportive care PRN dilaudid / tylenol for pain    FAMILY  - Updates: No family available.  She reports she lives with her son but no family has been to visit.  Will ask social work to assess for possible SNF placement and possible need for APS involvement.   - Inter-disciplinary family meet or Palliative Care meeting due by:  11/27/16  - Global:  Keep on SDU, transfer to [facility] as of 2/22.     Canary Brim, NP-C [facility] Pgr: [phone] or if no answer [phone] 11/21/2016, 8:06 AM  Attending Note:  I have examined patient, reviewed labs, studies and notes. I have discussed the case with B Ollis, and I agree with the data and  plans as amended above. 56 yo woman with HTN, hypothyroidism, PAF. Recent admission with lethargy, transaminitis, weakness, inability to care for herself. She returns now with a similar syndrome and evidence for liver dysfxn - elevated AST > ALT, lactic acidosis. She has had very poor PO, takes acetaminophen, narcs at home. ? Whether she uses EtOH as well. Clearly malnourished and unable to manage her ADL's. On my eval she is awake, interactive but a bit distracted and tangential. She gives a poor history. She has temporal wasting. Lungs clear bilaterally. Mild abd tenderness to palpation. She appears to have profound malnutrition, acetaminophen and dilaudid toxicity. Her lactate has cleared with IVF, ARF is improving. Likely can d/c bicarb in the next several hours. She is on empiric abx, no clear source infxn although she does have a sacral ulcer. Low threshold to d/c abx and follow. Will check HIV, RPR. She does not appear to be getting adequate care at home. I do not believe she can go home after this admission > needs SNF. ? Palliative care consult. [facility] will assume her care. PCCM will sign off 2/22.  Independent critical care time is 31 minutes.   Levy Pupa, MD, PhD 11/21/2016, 1:32 PM [facility] [phone] or if no answer [phone]

## 2016-11-22 ENCOUNTER — Inpatient Hospital Stay (HOSPITAL_COMMUNITY): Payer: Medicare Other

## 2016-11-22 DIAGNOSIS — D696 Thrombocytopenia, unspecified: Secondary | ICD-10-CM

## 2016-11-22 DIAGNOSIS — G894 Chronic pain syndrome: Secondary | ICD-10-CM

## 2016-11-22 DIAGNOSIS — Z86711 Personal history of pulmonary embolism: Secondary | ICD-10-CM

## 2016-11-22 LAB — PREPARE FRESH FROZEN PLASMA
BLOOD PRODUCT EXPIRATION DATE: 201802262359
Blood Product Expiration Date: 201802262359
ISSUE DATE / TIME: 201802210922
ISSUE DATE / TIME: 201802210611
UNIT TYPE AND RH: 7300
Unit Type and Rh: 7300

## 2016-11-22 LAB — HEPATIC FUNCTION PANEL
ALBUMIN: 1.6 g/dL — AB (ref 3.5–5.0)
ALK PHOS: 81 U/L (ref 38–126)
ALT: 51 U/L (ref 14–54)
AST: 84 U/L — AB (ref 15–41)
Bilirubin, Direct: 0.7 mg/dL — ABNORMAL HIGH (ref 0.1–0.5)
Indirect Bilirubin: 1 mg/dL — ABNORMAL HIGH (ref 0.3–0.9)
TOTAL PROTEIN: 3.5 g/dL — AB (ref 6.5–8.1)
Total Bilirubin: 1.7 mg/dL — ABNORMAL HIGH (ref 0.3–1.2)

## 2016-11-22 LAB — CBC
HCT: 28.8 % — ABNORMAL LOW (ref 36.0–46.0)
Hemoglobin: 9.7 g/dL — ABNORMAL LOW (ref 12.0–15.0)
MCH: 30.3 pg (ref 26.0–34.0)
MCHC: 33.7 g/dL (ref 30.0–36.0)
MCV: 90 fL (ref 78.0–100.0)
PLATELETS: 50 10*3/uL — AB (ref 150–400)
RBC: 3.2 MIL/uL — AB (ref 3.87–5.11)
RDW: 20 % — AB (ref 11.5–15.5)
WBC: 7 10*3/uL (ref 4.0–10.5)

## 2016-11-22 LAB — BLOOD GAS, VENOUS
Acid-Base Excess: 10.6 mmol/L — ABNORMAL HIGH (ref 0.0–2.0)
Bicarbonate: 34.9 mmol/L — ABNORMAL HIGH (ref 20.0–28.0)
O2 SAT: 79.4 %
PCO2 VEN: 45.7 mmHg (ref 44.0–60.0)
PH VEN: 7.495 — AB (ref 7.250–7.430)
PO2 VEN: 42 mmHg (ref 32.0–45.0)
Patient temperature: 98.6

## 2016-11-22 LAB — BASIC METABOLIC PANEL
ANION GAP: 11 (ref 5–15)
ANION GAP: 13 (ref 5–15)
BUN: 9 mg/dL (ref 6–20)
BUN: 8 mg/dL (ref 6–20)
CHLORIDE: 107 mmol/L (ref 101–111)
CO2: 32 mmol/L (ref 22–32)
CO2: 32 mmol/L (ref 22–32)
Calcium: 6 mg/dL — CL (ref 8.9–10.3)
Calcium: 6 mg/dL — CL (ref 8.9–10.3)
Chloride: 100 mmol/L — ABNORMAL LOW (ref 101–111)
Creatinine, Ser: 0.63 mg/dL (ref 0.44–1.00)
Creatinine, Ser: 0.55 mg/dL (ref 0.44–1.00)
GFR calc Af Amer: >60 mL/min (ref >60)
GFR calc non Af Amer: >60 mL/min (ref >60)
GLUCOSE: 171 mg/dL — AB (ref 65–99)
GLUCOSE: 190 mg/dL — AB (ref 65–99)
POTASSIUM: 3.3 mmol/L — AB (ref 3.5–5.1)
POTASSIUM: 3.2 mmol/L — AB (ref 3.5–5.1)
SODIUM: 145 mmol/L (ref 135–145)
Sodium: 150 mmol/L — ABNORMAL HIGH (ref 135–145)

## 2016-11-22 LAB — MAGNESIUM: Magnesium: 0.9 mg/dL — CL (ref 1.7–2.4)

## 2016-11-22 LAB — TYPE AND SCREEN
Blood Product Expiration Date: 201803142359
Blood Product Expiration Date: 201803142359
ISSUE DATE / TIME: 201802210058
ISSUE DATE / TIME: 201802210337
Unit Type and Rh: 7300
Unit Type and Rh: 7300

## 2016-11-22 LAB — CALCIUM, IONIZED: Calcium, Ionized, Serum: 3.9 mg/dL — ABNORMAL LOW (ref 4.5–5.6)

## 2016-11-22 LAB — HIV ANTIBODY (ROUTINE TESTING W REFLEX)
HIV Screen 4th Generation wRfx: NONREACTIVE
HIV Screen 4th Generation wRfx: NONREACTIVE

## 2016-11-22 LAB — LACTIC ACID, PLASMA
LACTIC ACID, VENOUS: 7.8 mmol/L — AB (ref 0.5–1.9)
LACTIC ACID, VENOUS: 7.9 mmol/L — AB (ref 0.5–1.9)

## 2016-11-22 LAB — HAPTOGLOBIN: Haptoglobin: 44 mg/dL (ref 34–200)

## 2016-11-22 LAB — PHOSPHORUS: Phosphorus: <1 mg/dL — CL (ref 2.5–4.6)

## 2016-11-22 MED ORDER — SODIUM BICARBONATE 8.4 % IV SOLN
INTRAVENOUS | Status: DC
  Filled 2016-11-22: qty 150

## 2016-11-22 MED ORDER — HYDROMORPHONE HCL 1 MG/ML IJ SOLN: 0.5000 mg | Freq: Four times a day (QID) | INTRAMUSCULAR | Status: DC | PRN

## 2016-11-22 MED ORDER — CEFAZOLIN IN D5W 1 GM/50ML IV SOLN
1.0000 g | Freq: Three times a day (TID) | INTRAVENOUS | Status: DC
  Filled 2016-11-22 (×2): qty 50

## 2016-11-22 MED ORDER — DEXTROSE 5 % IV SOLN
1.0000 g | INTRAVENOUS | Status: DC
  Administered 2016-11-22: 1 g via INTRAVENOUS
  Filled 2016-11-22: qty 1

## 2016-11-22 MED ORDER — MAGNESIUM SULFATE 4 GM/100ML IV SOLN
4.0000 g | Freq: Once | INTRAVENOUS | Status: AC
  Administered 2016-11-22: 4 g via INTRAVENOUS
  Filled 2016-11-22: qty 100

## 2016-11-22 MED ORDER — HYDROCORTISONE 2.5 % RE CREA
TOPICAL_CREAM | Freq: Three times a day (TID) | RECTAL | Status: DC
  Administered 2016-11-22 (×3): via RECTAL
  Administered 2016-11-23: 1 via RECTAL
  Filled 2016-11-22: qty 28.35

## 2016-11-22 MED ORDER — FAMOTIDINE 20 MG PO TABS
20.0000 mg | ORAL_TABLET | Freq: Two times a day (BID) | ORAL | Status: DC
  Administered 2016-11-22 – 2016-11-23 (×3): 20 mg via ORAL
  Filled 2016-11-22 (×3): qty 1

## 2016-11-22 MED ORDER — POTASSIUM CHLORIDE CRYS ER 20 MEQ PO TBCR
40.0000 meq | EXTENDED_RELEASE_TABLET | ORAL | Status: AC
  Filled 2016-11-22: qty 2

## 2016-11-22 MED ORDER — HYDROMORPHONE HCL 1 MG/ML IJ SOLN
0.5000 mg | INTRAMUSCULAR | Status: DC | PRN
  Administered 2016-11-22 – 2016-11-23 (×4): 0.5 mg via INTRAVENOUS
  Filled 2016-11-22 (×4): qty 0.5

## 2016-11-22 MED ORDER — SODIUM BICARBONATE 8.4 % IV SOLN: 100.0000 meq | Freq: Once | INTRAVENOUS | Status: DC

## 2016-11-22 MED ORDER — DEXTROSE 5 % IV SOLN
INTRAVENOUS | Status: DC
  Administered 2016-11-22: 1 mL via INTRAVENOUS

## 2016-11-22 MED ORDER — SODIUM CHLORIDE 0.9 % IV BOLUS (SEPSIS)
1000.0000 mL | Freq: Once | INTRAVENOUS | Status: AC
  Administered 2016-11-22: 1000 mL via INTRAVENOUS

## 2016-11-22 MED ORDER — K PHOS MONO-SOD PHOS DI & MONO 155-852-130 MG PO TABS
250.0000 mg | ORAL_TABLET | Freq: Two times a day (BID) | ORAL | Status: DC
  Administered 2016-11-22 (×2): 250 mg via ORAL
  Filled 2016-11-22 (×3): qty 1

## 2016-11-22 NOTE — Progress Notes (Signed)
Critical Lactic rec'd of 7.9. MD notified. No new orders given.

## 2016-11-22 NOTE — Progress Notes (Signed)
CSW consulted to assist with d/c planning. PN reviewed. Palliative Care Team will meet with family tomorrow. CSW will review recommendations and assist with d/c planning needs.  Cori Razor LCSW [phone]

## 2016-11-22 NOTE — Progress Notes (Addendum)
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 She was admitted 2/6 through 2/7 with a bleeding hemorrhoid and malnutrition. She has been admitted to the hospital about 8 times since April of last year.   ED notes mention that she presents this time with generalized pain. Per family, she has had a poor appetite for 2 wks after she was last discharged from the hospital on 2/7. She has a caregiver and also lives with her son. They have been trying to find a nursing home for her. Per caregiver, she does not take medications as she needs to.   In ER >> HR 120-140s, Temp 96.1, RR 42, pH 7.282, pCO2 too low to report, CO2 11, Anion gap 22, Sodium 151, K 3.0, Lactic acid and Pro calcitonin both 9.31  Hb 7.5 dropping to 5.4 with hydration  CT head 2/20 > nonacute CT abd/pelv 2/20 > Mild diffuse distention of small bowel and severe gastric distention may represent obstruction or ileus. Collapsed terminal ileum and colon from ascending segment to a sigmoid suggesting possible transition point of the ileum in the right lower quadrant. Large volume of stool in rectum. Stable 6 mm nodule within the left lower lobe of the lung. Enlarged common bile duct measuring up to 13 mm, previously 15 mm. Bilateral nonobstructing kidney stones  Subjective: Complains of pain due to hemorrhoids and nausea.   Patient does not ambulate. Lays in bed in a diaper. Her son states the aid comes in for 3-4 hrs in the AM and cleans her in the day and he cleans her at night. Patient states that she lays in stool until aid comes back in the morning. Son states they watch her take her medications BID. 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 At time states that her son is hardly ever home and then states he is home most of the time.   Assessment & Plan:   Principal Problem: Lactic acidosis, anion gap, tachycardia, tachypnea, K pneumoniae UTI - sepsis - poor perfusion- improving- awaiting Lactic acid level this AM - bicarb drip stopped as CO2 now 32 - Vanc and Maxipime started on admission - UA + for K pneumoniae - same organism grew out in 2/7 but she was asymptomatic- at this time, will continue to treat due to other symptoms of SIRS - had E faecalis UTI in 11/17 - laying in her diaper and non-obstructive renal stones likely contributing to UTIs- - no other urinary tract abnormality on CT - blood cultures negative   Addendum: lactic acid rising- resume Bicarb infusion  Severe malnutrition, hypoalbuminemia - albumin 1.6 - malnutrition has been an issue for months now - weight has dropped by at least 10 kg from 11/7 based on weights in our computer system  Ileus? - CT suggesting possible ileus on 2/20 - nauseated today- checked abd xray- no ileus present  Dehydration, AKI - baseline Cr 0.5- 0.8- up to 1.22 on admission - has trended down to 0.63 with volume replacement  Hypomagnesemia, hypokalemia, hypophosphatemia - replacing  Hypernatremia - change to D5 W  Hepatic steatosis/ Transaminitis - steatosis noted on multiple imaging - transaminitis noted to be  improving since admission and may be due to sepsis  Chronic pain - per med rec she is on Dilaudid 8 mg Q 4 hrs PRN- per son, she takes this BID at home-  - pain in her hemorrhoid and pain in all bony prominences    Hemorrhoid - pain with bleeding- on going issues since at least Jan- currently has blood in rectal area around hemorrhoid which would explain her anemia on admission  - Anusol cream started  Anemia - due to slow blood loss and due to acute illness - Hb of 5.4 after  hydration on 2/20 - given 2 U PRBC and 2 U FFP as she is on Eliquis - haptoglobin normal- not hemolyzing     Ref. Range 11/20/2016 23:20  Iron Latest Ref Range: 28 - 170 ug/dL 52  UIBC Latest Units: ug/dL NOT CALCULATED  TIBC Latest Ref Range: 250 - 450 ug/dL NOT CALCULATED  Saturation Ratios Latest Ref Range: 10.4 - 31.8 % NOT CALCULATED  Ferritin Latest Ref Range: 11 - 307 ng/mL 326 (H)  Folate Latest Ref Range: >5.9 ng/mL 9.2      Thrombocytopenia   - due to sepsis vs blood loss- follow- holding Eliquis    Hypothyroidism - TSH normal on 1/20  Sacral decubitus ulcer, stage II - nursing care  H/o DVT/PE, May Thurner syndrome - D dimer normal on 2/20- off Eliquis for now   FTT - HIV, RPR-  non- reactive   DVT prophylaxis: SCS Code Status: Full code Family Communication:  Disposition Plan: SDU- have consulted palliative care due to as she has stopped eating, has severe decline in health and chronic pain- I recommend hospice for her- son would like for her to go to a facility Consultants:   PCCM Procedures:    Antimicrobials:  Anti-infectives    Start     Dose/Rate Route Frequency Ordered Stop   11/21/16 1800  vancomycin (VANCOCIN) IVPB 750 mg/150 ml premix     750 mg 150 mL/hr over 60 Minutes Intravenous Every 24 hours 11/20/16 1854     11/21/16 1800  ceFEPIme (MAXIPIME) 1 g in dextrose 5 % 50 mL IVPB     1 g 100 mL/hr over 30 Minutes Intravenous Every 24 hours 11/20/16 1854     11/20/16 1745  vancomycin (VANCOCIN) IVPB 1000 mg/200 mL premix     1,000 mg 200 mL/hr over 60 Minutes Intravenous  Once 11/20/16 1742 11/20/16 1935   11/20/16 1745  ceFEPIme (MAXIPIME) 2 g in dextrose 5 % 50 mL IVPB     2 g 100 mL/hr over 30 Minutes Intravenous  Once 11/20/16 1742 11/20/16 1840       Objective: Vitals:   11/22/16 0500 11/22/16 0600 11/22/16 0800 11/22/16 0900  BP: 96/68 95/68 97/69  (!) 89/62  Pulse:      Resp: 16 16 17 17   Temp: 99.1 F (37.3 C) 99.3 F (37.4 C)  99.3 F (37.4 C) 99.3 F (37.4 C)  TempSrc:   Core (Comment)   SpO2: 99%  100% 100%  Weight: 38.8 kg (85 lb 8.6 oz)       Intake/Output Summary (Last 24 hours) at 11/22/16 1202 Last data filed at 11/22/16 0600  Gross per 24 hour  Intake             2315 ml  Output             1500 ml  Net  815 ml   Filed Weights   11/21/16 0000 11/21/16 0131 11/22/16 0500  Weight: 34.4 kg (75 lb 13.4 oz) 34.4 kg (75 lb 13.4 oz) 38.8 kg (85 lb 8.6 oz)    Examination: General exam: Appears comfortable - cachectic HEENT: PERRLA, oral mucosa moist, no sclera icterus or thrush Respiratory system: Clear to auscultation. Respiratory effort normal. Cardiovascular system: S1 & S2 heard, RRR.  No murmurs  Gastrointestinal system: Abdomen soft, mild epigastric tenderness, nondistended. Normal bowel sound. No organomegaly GU: rectal hemorrhoid with blood around anus Central nervous system: Alert and oriented. No focal neurological deficits. Extremities: No cyanosis, clubbing or edema Skin: No rashes or ulcers Psychiatry:  Flat affect    Data Reviewed: I have personally reviewed following labs and imaging studies  CBC:  Recent Labs Lab 11/20/16 1527 11/20/16 1719 11/20/16 1841 11/20/16 2322 11/21/16 1200  WBC 6.9 7.5 7.5  --  9.2  NEUTROABS 4.7 6.4 6.4  --  6.1  HGB 7.5* 7.0* 5.4*  --  9.3*  HCT 24.7* 22.7* 16.8*  --  27.3*  MCV 104.2* 102.3* 98.8  --  87.5  PLT 18* 151 96* 100* 71*   Basic Metabolic Panel:  Recent Labs Lab 11/20/16 2328 11/21/16 1200 11/21/16 1540 11/21/16 2014 11/22/16 0535  NA 151* 153* 152* 150* 150*  K 3.0* 2.7* 2.4* 3.2* 3.3*  CL 118* 117* 114* 109 107  CO2 11* 24 27 32 32  GLUCOSE 250* 174* 143* 225* 171*  BUN 12 10 10 9 9   CREATININE 1.05* 0.74 0.69 0.69 0.63  CALCIUM 6.6* 6.2* 6.2* 6.1* 6.0*  MG  --  1.1*  --   --  0.9*  PHOS  --  1.5*  --   --  <1.0*   GFR: Estimated Creatinine Clearance: 47 mL/min (by C-G formula based on SCr of 0.63  mg/dL). Liver Function Tests:  Recent Labs Lab 11/20/16 1527 11/21/16 1200 11/22/16 0535  AST 239* 87* 84*  ALT 98* 54 51  ALKPHOS 129* 77 81  BILITOT 1.8* 1.5* 1.7*  PROT 4.1* 3.6* 3.5*  ALBUMIN 1.8* 1.7* 1.6*   No results for input(s): LIPASE, AMYLASE in the last 168 hours. No results for input(s): AMMONIA in the last 168 hours. Coagulation Profile:  Recent Labs Lab 11/20/16 2322  INR 2.01   Cardiac Enzymes:  Recent Labs Lab 11/20/16 1528 11/20/16 2328 11/21/16 1200  TROPONINI 0.12* 0.13* 0.23*   BNP (last 3 results) No results for input(s): PROBNP in the last 8760 hours. HbA1C: No results for input(s): HGBA1C in the last 72 hours. CBG:  Recent Labs Lab 11/20/16 1503  GLUCAP 93   Lipid Profile: No results for input(s): CHOL, HDL, LDLCALC, TRIG, CHOLHDL, LDLDIRECT in the last 72 hours. Thyroid Function Tests: No results for input(s): TSH, T4TOTAL, FREET4, T3FREE, THYROIDAB in the last 72 hours. Anemia Panel:  Recent Labs  11/20/16 2320 11/20/16 2328  VITAMINB12 937*  --   FOLATE 9.2  --   FERRITIN 326*  --   TIBC NOT CALCULATED  --   IRON 52  --   RETICCTPCT  --  2.6   Urine analysis:    Component Value Date/Time   COLORURINE AMBER (A) 11/20/2016 1419   APPEARANCEUR CLEAR 11/20/2016 1419   LABSPEC 1.025 11/20/2016 1419   PHURINE 5.0 11/20/2016 1419   GLUCOSEU NEGATIVE 11/20/2016 1419   HGBUR NEGATIVE 11/20/2016 1419   BILIRUBINUR NEGATIVE 11/20/2016 1419   KETONESUR 20 (A) 11/20/2016 1419   PROTEINUR NEGATIVE 11/20/2016  1419   UROBILINOGEN 1.0 02/28/2015 1227   NITRITE NEGATIVE 11/20/2016 1419   LEUKOCYTESUR MODERATE (A) 11/20/2016 1419   Sepsis Labs: @LABRCNTIP (procalcitonin:4,lacticidven:4) ) Recent Results (from the past 240 hour(s))  Urine culture     Status: Abnormal (Preliminary result)   Collection Time: 11/20/16  2:19 PM  Result Value Ref Range Status   Specimen Description URINE, CATHETERIZED  Final   Special Requests NONE   Final   Culture (A)  Final    >=100,000 COLONIES/mL KLEBSIELLA PNEUMONIAE SUSCEPTIBILITIES TO FOLLOW Performed at [facility], [address], [place], [place] [address]    Report Status PENDING  Incomplete  Blood Culture (routine x 2)     Status: None (Preliminary result)   Collection Time: 11/20/16  5:52 PM  Result Value Ref Range Status   Specimen Description BLOOD CENTRAL LINE  Final   Special Requests BOTTLES DRAWN AEROBIC AND ANAEROBIC 5CC  Final   Culture   Final    NO GROWTH < 24 HOURS Performed at [facility], [address] [place], [place] [address]    Report Status PENDING  Incomplete  MRSA PCR Screening     Status: None   Collection Time: 11/20/16  9:43 PM  Result Value Ref Range Status   MRSA by PCR NEGATIVE NEGATIVE Final    Comment:        The GeneXpert MRSA Assay (FDA approved for NASAL specimens only), is one component of a comprehensive MRSA colonization surveillance program. It is not intended to diagnose MRSA infection nor to guide or monitor treatment for MRSA infections.          Radiology Studies: Ct Abdomen Pelvis Wo Contrast  Result Date: 11/20/2016 CLINICAL DATA:  56 y/o F; generalized abdominal pain. Failure to thrive at home. EXAM: CT ABDOMEN AND PELVIS WITHOUT CONTRAST TECHNIQUE: Multidetector CT imaging of the abdomen and pelvis was performed following the standard protocol without IV contrast. COMPARISON:  05/15/2016 CT of abdomen and pelvis. FINDINGS: Lower chest: Stable 6 mm left lower lobe nodule (series 4, image 31). Moderate coronary artery calcification. Hepatobiliary: Hepatic steatosis. Normal gallbladder. Stable lesion within the dome of the liver with central fluid attenuation, probably a cyst, measuring up to 25 mm. Enlarged common bile duct measuring up to 13 mm, previously 15 mm. Pancreas: Unremarkable. No pancreatic ductal dilatation or surrounding inflammatory changes. Spleen: Normal in size without focal  abnormality. Adrenals/Urinary Tract: Bilateral kidney stones. No hydronephrosis. Mildly distended bladder. Stomach/Bowel: Mild diffuse distention of small bowel with collapsed terminal ileum and the right lower quadrant and largely collapsed colon except for large volume of stool in the rectum. Normal appendix. Retained contrast within the colon. Vascular/Lymphatic: Aortic atherosclerosis with severe calcification. Left common iliac vein stent. No enlarged abdominal or pelvic lymph nodes. Reproductive: Uterus and bilateral adnexa are unremarkable. Other: No abdominal wall hernia or abnormality. No abdominopelvic ascites. Musculoskeletal: No acute or significant osseous findings. IMPRESSION: 1. Mild diffuse distention of small bowel and severe gastric distention may represent obstruction or ileus. Collapsed terminal ileum and colon from ascending segment to a sigmoid suggesting possible transition point of the ileum in the right lower quadrant. 2. Large volume of stool in rectum. 3. Stable 6 mm nodule within the left lower lobe of the lung. Follow-up per prior CT abdomen and pelvis study recommendations. 4. Enlarged common bile duct measuring up to 13 mm, previously 15 mm. 5. Hepatic steatosis. 6. Aortic atherosclerosis. 7. Bilateral nonobstructing kidney stones. Electronically Signed  By: Mitzi Hansen M.D.   On: 11/20/2016 16:49   Dg Chest 2 View  Result Date: 11/20/2016 CLINICAL DATA:  Shortness of breath.  Pain . EXAM: CHEST  2 VIEW COMPARISON:  11/06/2016. FINDINGS: Mediastinum and hilar structures are normal. Heart size normal. No focal infiltrate. No pleural effusion or pneumothorax. Deformity noted of the right posterior sixth rib consistent with old fracture. Rounded soft tissue density noted over the left upper abdomen most likely stool within colon or debris within the stomach. IMPRESSION: No acute cardiopulmonary disease. Electronically Signed   By: Maisie Fus  Register   On: 11/20/2016 14:56     Ct Head Wo Contrast  Result Date: 11/20/2016 CLINICAL DATA:  Pt from home with complaints of generalized pain. Pt is normally in pain at her baseline. Pt is here for failure to thrive from home. Pt lives with many family members. Pt was recently hospitalized for hypoglycemia. Pt cbg for EMS was 66 Pt is tachycardic at this time. Per family pt has had poor appetite for 2 weeks. Pt denies CP. Pt has no cardiac history Pt sates she is having a hard time breathing, but her oxygen saturation is at 100% RA EXAM: CT HEAD WITHOUT CONTRAST TECHNIQUE: Contiguous axial images were obtained from the base of the skull through the vertex without intravenous contrast. COMPARISON:  08/12/2016 FINDINGS: Brain: No evidence of acute infarction, hemorrhage, hydrocephalus, extra-axial collection or mass lesion/mass effect. The ventricles and sulci are enlarged reflecting mild diffuse atrophy, stable from prior exam. There is mild periventricular white matter hypoattenuation consistent with chronic microvascular ischemic change, also stable. Vascular: No hyperdense vessel or unexpected calcification. Skull: Normal. Negative for fracture or focal lesion. Sinuses/Orbits: Visualize globes and orbits are unremarkable. Visualized sinuses and mastoid air cells are clear. Other: None. IMPRESSION: 1. No acute intracranial abnormalities. 2. Mild atrophy and chronic microvascular ischemic change, stable from the prior study. Electronically Signed   By: Amie Portland M.D.   On: 11/20/2016 16:36   Dg Chest Port 1 View  Result Date: 11/21/2016 CLINICAL DATA:  56 year old female with dyspnea. Subsequent encounter. EXAM: PORTABLE CHEST 1 VIEW COMPARISON:  11/20/2016, 11/06/2016 and 03/29/2015. FINDINGS: Question nodule right midlung. No segmental consolidation, pulmonary edema or pneumothorax. Slightly tortuous aorta. Heart size within normal limits. Gas-filled stomach and colon. No acute osseous abnormality. IMPRESSION: Question nodule mid  right lung. This can be assessed on follow-up with cardiac leads removed. No infiltrate or congestive heart failure. Electronically Signed   By: Lacy Duverney M.D.   On: 11/21/2016 06:57      Scheduled Meds: . ceFEPime (MAXIPIME) IV  1 g Intravenous Q24H  . Chlorhexidine Gluconate Cloth  6 each Topical Daily  . Chlorhexidine Gluconate Cloth  6 each Topical Daily  . famotidine  20 mg Oral BID  . feeding supplement (ENSURE ENLIVE)  237 mL Oral TID WC  . hydrocortisone   Rectal TID  . multivitamin with minerals  1 tablet Oral Daily  . potassium chloride  40 mEq Oral Q4H  . sodium chloride flush  3 mL Intravenous Q12H  . vancomycin  750 mg Intravenous Q24H   Continuous Infusions: . dextrose       LOS: 2 days    Time spent in minutes: 35    Aneka Fagerstrom, MD [facility] Pager: [email or web address] Password [facility] 11/22/2016, 12:02 PM

## 2016-11-22 NOTE — Progress Notes (Signed)
CRITICAL VALUE ALERT  Critical value received:  Lactic Acid 7.9  Date of notification: 11/22/16  Time of notification:  1730  Critical value read back:Yes.    Nurse who received alert:  Georg Ruddle  MD notified (1st page):  Rizwan  Time of first page:  1730   Time MD responded:  [phone]

## 2016-11-22 NOTE — Progress Notes (Signed)
CRITICAL VALUE ALERT  Critical value received:  Ca:6.0, Phosphorus:<1 and Mag: 0.9  Date of notification:  11/22/16  Time of notification:  0613   Critical value read back:Yes.    Nurse who received alert:  Porfirio Oar RN    MD notified (1st page):  Nicholos Johns  Time of first page:  0630  Responding MD:  Nicholos Johns  Time MD responded:  [phone]

## 2016-11-22 NOTE — Progress Notes (Signed)
Palliative Care  I reached out to the patient's son, [name]. He had just spoken with the primary team and had a good understanding of his mother's health issues. We briefly discussed her health over the past few months, how she was doing now, and his expectation for the future. He was able to verbalize the belief that she was approaching the end of her life, and he would want her to die in the hospital. We did discuss code status, and he felt he needed to think about it further. While he accepts that his mother may be dying, he is not quite ready to make decisions to progress to full comfort care (i.e he wanted to continue abx, fluids, labs, imaging, etc. for the time being). I asked him to think on these issues tonight, and we plan to meet tomorrow between 1-2pm for another discussion.  Plan Family meeting 2/23 between 1-2pm  [name] [facility] [phone] (cell, 7a-4p) [phone] (team phone after hours and on weekends)  No charge note.

## 2016-11-23 DIAGNOSIS — L89152 Pressure ulcer of sacral region, stage 2: Secondary | ICD-10-CM

## 2016-11-23 DIAGNOSIS — Z7189 Other specified counseling: Secondary | ICD-10-CM

## 2016-11-23 DIAGNOSIS — R6520 Severe sepsis without septic shock: Secondary | ICD-10-CM

## 2016-11-23 DIAGNOSIS — A419 Sepsis, unspecified organism: Secondary | ICD-10-CM

## 2016-11-23 DIAGNOSIS — E039 Hypothyroidism, unspecified: Secondary | ICD-10-CM

## 2016-11-23 DIAGNOSIS — Z515 Encounter for palliative care: Secondary | ICD-10-CM

## 2016-11-23 LAB — LACTIC ACID, PLASMA
LACTIC ACID, VENOUS: 7.5 mmol/L — AB (ref 0.5–1.9)
LACTIC ACID, VENOUS: 5.9 mmol/L — AB (ref 0.5–1.9)

## 2016-11-23 LAB — PHOSPHORUS: Phosphorus: <1 mg/dL — CL (ref 2.5–4.6)

## 2016-11-23 LAB — BASIC METABOLIC PANEL
ANION GAP: 12 (ref 5–15)
BUN: 8 mg/dL (ref 6–20)
CALCIUM: 6 mg/dL — AB (ref 8.9–10.3)
CO2: 31 mmol/L (ref 22–32)
CREATININE: 0.62 mg/dL (ref 0.44–1.00)
Chloride: 102 mmol/L (ref 101–111)
GFR calc Af Amer: >60 mL/min (ref >60)
GLUCOSE: 98 mg/dL (ref 65–99)
Potassium: 3.2 mmol/L — ABNORMAL LOW (ref 3.5–5.1)
Sodium: 145 mmol/L (ref 135–145)

## 2016-11-23 LAB — URINE CULTURE: Culture: >=100000 — AB

## 2016-11-23 LAB — MAGNESIUM: Magnesium: 2.1 mg/dL (ref 1.7–2.4)

## 2016-11-23 MED ORDER — CEFAZOLIN IN D5W 1 GM/50ML IV SOLN
1.0000 g | Freq: Three times a day (TID) | INTRAVENOUS | Status: DC
  Administered 2016-11-23: 1 g via INTRAVENOUS
  Filled 2016-11-23 (×2): qty 50

## 2016-11-23 MED ORDER — DEXTROSE 5 % IV SOLN
INTRAVENOUS | Status: DC
  Administered 2016-11-23: 13:00:00 via INTRAVENOUS

## 2016-11-23 MED ORDER — LIP MEDEX EX OINT: TOPICAL_OINTMENT | CUTANEOUS | Status: DC | PRN

## 2016-11-23 MED ORDER — ALPRAZOLAM 0.5 MG PO TABS
0.5000 mg | ORAL_TABLET | Freq: Three times a day (TID) | ORAL | Status: DC | PRN
  Administered 2016-11-23: 0.5 mg via ORAL
  Filled 2016-11-23: qty 1

## 2016-11-23 MED ORDER — GLYCOPYRROLATE 0.2 MG/ML IJ SOLN
0.2000 mg | INTRAMUSCULAR | Status: DC | PRN
  Filled 2016-11-23: qty 1

## 2016-11-23 MED ORDER — K PHOS MONO-SOD PHOS DI & MONO 155-852-130 MG PO TABS
500.0000 mg | ORAL_TABLET | Freq: Three times a day (TID) | ORAL | Status: DC
  Administered 2016-11-23: 500 mg via ORAL
  Filled 2016-11-23 (×2): qty 2
  Filled 2016-11-23: qty 1

## 2016-11-23 MED ORDER — HALOPERIDOL LACTATE 5 MG/ML IJ SOLN: 0.5000 mg | INTRAMUSCULAR | Status: DC | PRN

## 2016-11-23 MED ORDER — POLYVINYL ALCOHOL 1.4 % OP SOLN
1.0000 [drp] | Freq: Four times a day (QID) | OPHTHALMIC | Status: DC | PRN
  Filled 2016-11-23: qty 15

## 2016-11-23 MED ORDER — HYDROMORPHONE HCL 2 MG/ML IJ SOLN: 1.0000 mg | INTRAMUSCULAR | Status: DC | PRN

## 2016-11-23 MED ORDER — BIOTENE DRY MOUTH MT LIQD: 15.0000 mL | OROMUCOSAL | Status: DC | PRN

## 2016-11-23 MED ORDER — SODIUM CHLORIDE 0.9 % IV BOLUS (SEPSIS): 1000.0000 mL | Freq: Once | INTRAVENOUS | Status: DC

## 2016-11-23 MED ORDER — SODIUM CHLORIDE 0.9 % IV SOLN: INTRAVENOUS | Status: DC

## 2016-11-23 MED ORDER — BOOST / RESOURCE BREEZE PO LIQD: 1.0000 | Freq: Three times a day (TID) | ORAL | Status: DC

## 2016-11-23 MED ORDER — HALOPERIDOL LACTATE 2 MG/ML PO CONC
0.5000 mg | ORAL | Status: DC | PRN
  Filled 2016-11-23: qty 0.3

## 2016-11-23 NOTE — Progress Notes (Addendum)
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 She was admitted 2/6 through 2/7 with a bleeding hemorrhoid and malnutrition. She has been admitted to the hospital about 8 times since April of last year.   ED notes mention that she presents this time with generalized pain. Per family, she has had a poor appetite for 2 wks after she was last discharged from the hospital on 2/7. She has a caregiver and also lives with her son. They have been trying to find a nursing home for her. Per caregiver, she does not take medications as she needs to.    Patient does not ambulate and essentially lays in bed in a diaper. She is intermittently confused and history provided by her is unreliable. Her son states that an Aid comes in for 3-4 hrs in the AM and cleans her in the day and he cleans her at night however the patient states that she lays in stool until aid comes back in the morning. Son states they watch her take her medications BID. Patient is convinced she "broke both" her legs about 2-8 months ago.  At time states that her son is hardly ever home and then states he is home most of the time.    In ER >> HR 120-140s, Temp 96.1, RR 42, pH 7.282, pCO2 too low to report, CO2 11, Anion gap 22, Sodium 151, K 3.0, Lactic acid and Pro calcitonin both 9.31  Hb 7.5 dropping to 5.4 with hydration  CT head 2/20 > nonacute CT abd/pelv 2/20 > Mild diffuse distention of small bowel and severe gastric distention may represent obstruction or ileus. Collapsed terminal ileum and colon from ascending segment to a sigmoid suggesting possible transition point of the ileum in the right lower quadrant. Large volume of  stool in rectum. Stable 6 mm nodule within the left lower lobe of the lung. Enlarged common bile duct measuring up to 13 mm, previously 15 mm. Bilateral nonobstructing kidney stones  Subjective: Complains of pain due to hemorrhoids and nausea.   Assessment & Plan:   Principal Problem: Lactic acidosis, anion gap, tachycardia, tachypnea, K pneumoniae UTI - sepsis - despite improvement in pH and Bicarb, Lactic acid is not improving- she appears well hydrated now and is 8 L + balance - bicarb drip stopped 2/21 as CO2 normalized - Vanc and Maxipime started on admission - UA + for K pneumoniae - same organism grew out in 2/7 but she was asymptomatic- at this time, will continue to treat due to other symptoms of SIRS - will give 7 days of antibiotics- narrowed to Ancef today based on sensitivities - had E faecalis UTI in 11/17 - laying in her diaper and non-obstructive renal stones likely contributing to UTIs- - no other urinary tract abnormality on CT - blood cultures negative   Severe malnutrition, hypoalbuminemia - albumin 1.6 - malnutrition has been an issue for many months now (has been documented in noted for close to 1 yr) - weight has dropped by at least 10 kg from 11/7 based on weights in our computer system - we are continuing IV hydration as, without it, she would quickly become dehydrated - Hospice has been consulted for this reason  Dehydration, AKI - baseline Cr 0.5- 0.8- up to 1.22 on admission - has trended down to 0.63 with volume replacement  Hypomagnesemia, hypokalemia, hypophosphatemia - due to malnutrition - replacing  Hypernatremia - change to D5 W  Ileus? - CT suggesting possible ileus on 2/20 - nauseated 2/22 therefore, checked abd xray- no ileus present  Hepatic steatosis/ Transaminitis - steatosis noted on multiple imaging - transaminitis noted to be improving since admission and may be due to sepsis  Chronic pain - per med rec she is on Dilaudid 8 mg  Q 4 hrs PRN- per son, she takes this BID at home-  - pain in her hemorrhoid and pain in all bony prominences    Hemorrhoid - pain and bleeding - on going issues since at least Jan- currently has blood in rectal area around hemorrhoid which would explain her anemia on admission  - Anusol cream started  Anemia - due to slow blood loss and due to acute illness - Hb of 5.4 after hydration on 2/20 - given 2 U PRBC and 2 U FFP as she is on Eliquis - haptoglobin normal- not hemolyzing     Ref. Range 11/20/2016 23:20  Iron Latest Ref Range: 28 - 170 ug/dL 52  UIBC Latest Units: ug/dL NOT CALCULATED  TIBC Latest Ref Range: 250 - 450 ug/dL NOT CALCULATED  Saturation Ratios Latest Ref Range: 10.4 - 31.8 % NOT CALCULATED  Ferritin Latest Ref Range: 11 - 307 ng/mL 326 (H)  Folate Latest Ref Range: >5.9 ng/mL 9.2      Thrombocytopenia   - due to sepsis vs blood loss- follow- holding Eliquis    Hypothyroidism - TSH normal on 1/20  Sacral decubitus ulcer, stage II - nursing care  H/o DVT/PE, May Thurner syndrome - D dimer normal on 2/20- off Eliquis for now   FTT - HIV, RPR-  non- reactive   DVT prophylaxis: SCS Code Status: Full code Family Communication:  Disposition Plan:   have consulted palliative care as she has stopped eating, has severe decline in health and chronic pain- - son would like for her to go to a facility Consultants:   PCCM Procedures:    Antimicrobials:  Anti-infectives    Start     Dose/Rate Route Frequency Ordered Stop   11/23/16 1000  ceFAZolin (ANCEF) IVPB 1 g/50 mL premix     1 g 100 mL/hr over 30 Minutes Intravenous Every 8 hours 11/23/16 0753     11/22/16 1800  ceFEPIme (MAXIPIME) 1 g in dextrose 5 % 50 mL IVPB  Status:  Discontinued     1 g 100 mL/hr over 30 Minutes Intravenous Every 24 hours 11/22/16 1509 11/23/16 0808   11/22/16 1400  ceFAZolin (ANCEF) IVPB 1 g/50 mL premix  Status:  Discontinued     1 g 100 mL/hr over 30 Minutes Intravenous  Every 8 hours 11/22/16 1246 11/22/16 1509   11/21/16 1800  vancomycin (VANCOCIN) IVPB 750 mg/150 ml premix  Status:  Discontinued     750 mg 150 mL/hr over 60 Minutes Intravenous Every 24 hours 11/20/16 1854 11/22/16 1228   11/21/16 1800  ceFEPIme (MAXIPIME) 1 g in dextrose 5 % 50 mL IVPB  Status:  Discontinued     1 g 100 mL/hr over 30 Minutes Intravenous Every 24 hours 11/20/16 1854 11/22/16 1308   11/20/16 1745  vancomycin (VANCOCIN) IVPB 1000 mg/200 mL premix     1,000 mg 200 mL/hr over 60 Minutes Intravenous  Once 11/20/16 1742 11/20/16  1935   11/20/16 1745  ceFEPIme (MAXIPIME) 2 g in dextrose 5 % 50 mL IVPB     2 g 100 mL/hr over 30 Minutes Intravenous  Once 11/20/16 1742 11/20/16 1840       Objective: Vitals:   11/23/16 0800 11/23/16 0900 11/23/16 1000 11/23/16 1100  BP: (!) 88/65 110/80 112/76 115/76  Pulse:      Resp: 16 (!) 27 (!) 31 20  Temp: 99.7 F (37.6 C) 99.7 F (37.6 C) 99.9 F (37.7 C) 99.4 F (37.4 C)  TempSrc:    Core (Comment)  SpO2: 97% 98% 96% 98%  Weight:      Height:        Intake/Output Summary (Last 24 hours) at 11/23/16 1202 Last data filed at 11/23/16 1019  Gross per 24 hour  Intake              100 ml  Output              680 ml  Net             -580 ml   Filed Weights   11/21/16 0131 11/22/16 0500 11/23/16 0442  Weight: 34.4 kg (75 lb 13.4 oz) 38.8 kg (85 lb 8.6 oz) 41.5 kg (91 lb 7.9 oz)    Examination: General exam: Appears comfortable - cachectic HEENT: PERRLA, oral mucosa moist, no sclera icterus or thrush Respiratory system: Clear to auscultation. Respiratory effort normal. Cardiovascular system: S1 & S2 heard, RRR.  No murmurs  Gastrointestinal system: Abdomen soft, mild epigastric tenderness, nondistended. Normal bowel sound. No organomegaly GU: rectal hemorrhoid with blood around anus Central nervous system: Alert and oriented. No focal neurological deficits. Extremities: No cyanosis, clubbing or edema Skin: No rashes or  ulcers Psychiatry:  Flat affect    Data Reviewed: I have personally reviewed following labs and imaging studies  CBC:  Recent Labs Lab 11/20/16 1527 11/20/16 1719 11/20/16 1841 11/20/16 2322 11/21/16 1200 11/22/16 1210  WBC 6.9 7.5 7.5  --  9.2 7.0  NEUTROABS 4.7 6.4 6.4  --  6.1  --   HGB 7.5* 7.0* 5.4*  --  9.3* 9.7*  HCT 24.7* 22.7* 16.8*  --  27.3* 28.8*  MCV 104.2* 102.3* 98.8  --  87.5 90.0  PLT 18* 151 96* 100* 71* 50*   Basic Metabolic Panel:  Recent Labs Lab 11/21/16 1200 11/21/16 1540 11/21/16 2014 11/22/16 0535 11/22/16 1504 11/23/16 0445  NA 153* 152* 150* 150* 145 145  K 2.7* 2.4* 3.2* 3.3* 3.2* 3.2*  CL 117* 114* 109 107 100* 102  CO2 24 27 32 32 32 31  GLUCOSE 174* 143* 225* 171* 190* 98  BUN 10 10 9 9 8 8   CREATININE 0.74 0.69 0.69 0.63 0.55 0.62  CALCIUM 6.2* 6.2* 6.1* 6.0* 6.0* 6.0*  MG 1.1*  --   --  0.9*  --  2.1  PHOS 1.5*  --   --  <1.0*  --  <1.0*   GFR: Estimated Creatinine Clearance: 50.2 mL/min (by C-G formula based on SCr of 0.62 mg/dL). Liver Function Tests:  Recent Labs Lab 11/20/16 1527 11/21/16 1200 11/22/16 0535  AST 239* 87* 84*  ALT 98* 54 51  ALKPHOS 129* 77 81  BILITOT 1.8* 1.5* 1.7*  PROT 4.1* 3.6* 3.5*  ALBUMIN 1.8* 1.7* 1.6*   No results for input(s): LIPASE, AMYLASE in the last 168 hours. No results for input(s): AMMONIA in the last 168 hours. Coagulation Profile:  Recent Labs Lab  11/20/16 2322  INR 2.01   Cardiac Enzymes:  Recent Labs Lab 11/20/16 1528 11/20/16 2328 11/21/16 1200  TROPONINI 0.12* 0.13* 0.23*   BNP (last 3 results) No results for input(s): PROBNP in the last 8760 hours. HbA1C: No results for input(s): HGBA1C in the last 72 hours. CBG:  Recent Labs Lab 11/20/16 1503  GLUCAP 93   Lipid Profile: No results for input(s): CHOL, HDL, LDLCALC, TRIG, CHOLHDL, LDLDIRECT in the last 72 hours. Thyroid Function Tests: No results for input(s): TSH, T4TOTAL, FREET4, T3FREE,  THYROIDAB in the last 72 hours. Anemia Panel:  Recent Labs  11/20/16 2320 11/20/16 2328  VITAMINB12 937*  --   FOLATE 9.2  --   FERRITIN 326*  --   TIBC NOT CALCULATED  --   IRON 52  --   RETICCTPCT  --  2.6   Urine analysis:    Component Value Date/Time   COLORURINE AMBER (A) 11/20/2016 1419   APPEARANCEUR CLEAR 11/20/2016 1419   LABSPEC 1.025 11/20/2016 1419   PHURINE 5.0 11/20/2016 1419   GLUCOSEU NEGATIVE 11/20/2016 1419   HGBUR NEGATIVE 11/20/2016 1419   BILIRUBINUR NEGATIVE 11/20/2016 1419   KETONESUR 20 (A) 11/20/2016 1419   PROTEINUR NEGATIVE 11/20/2016 1419   UROBILINOGEN 1.0 02/28/2015 1227   NITRITE NEGATIVE 11/20/2016 1419   LEUKOCYTESUR MODERATE (A) 11/20/2016 1419   Sepsis Labs: @LABRCNTIP (procalcitonin:4,lacticidven:4) ) Recent Results (from the past 240 hour(s))  Urine culture     Status: Abnormal   Collection Time: 11/20/16  2:19 PM  Result Value Ref Range Status   Specimen Description URINE, CATHETERIZED  Final   Special Requests NONE  Final   Culture >=100,000 COLONIES/mL KLEBSIELLA PNEUMONIAE (A)  Final   Report Status 11/23/2016 FINAL  Final   Organism ID, Bacteria KLEBSIELLA PNEUMONIAE (A)  Final      Susceptibility   Klebsiella pneumoniae - MIC*    AMPICILLIN RESISTANT Resistant     CEFAZOLIN <=4 SENSITIVE Sensitive     CEFTRIAXONE <=1 SENSITIVE Sensitive     CIPROFLOXACIN <=0.25 SENSITIVE Sensitive     GENTAMICIN <=1 SENSITIVE Sensitive     IMIPENEM <=0.25 SENSITIVE Sensitive     NITROFURANTOIN <=16 SENSITIVE Sensitive     TRIMETH/SULFA <=20 SENSITIVE Sensitive     AMPICILLIN/SULBACTAM 4 SENSITIVE Sensitive     PIP/TAZO <=4 SENSITIVE Sensitive     Extended ESBL NEGATIVE Sensitive     * >=100,000 COLONIES/mL KLEBSIELLA PNEUMONIAE  Blood Culture (routine x 2)     Status: None (Preliminary result)   Collection Time: 11/20/16  5:52 PM  Result Value Ref Range Status   Specimen Description BLOOD CENTRAL LINE  Final   Special Requests  BOTTLES DRAWN AEROBIC AND ANAEROBIC 5CC  Final   Culture   Final    NO GROWTH 3 DAYS Performed at [facility], [address] [place], [place] [address]    Report Status PENDING  Incomplete  MRSA PCR Screening     Status: None   Collection Time: 11/20/16  9:43 PM  Result Value Ref Range Status   MRSA by PCR NEGATIVE NEGATIVE Final    Comment:        The GeneXpert MRSA Assay (FDA approved for NASAL specimens only), is one component of a comprehensive MRSA colonization surveillance program. It is not intended to diagnose MRSA infection nor to guide or monitor treatment for MRSA infections.   Blood Culture (routine x 2)     Status: None (Preliminary result)   Collection Time: 11/20/16  10:42 PM  Result Value Ref Range Status   Specimen Description BLOOD RIGHT THUMB  Final   Special Requests IN PEDIATRIC BOTTLE  Final   Culture   Final    NO GROWTH 2 DAYS Performed at [facility], [address] [place], [place] [address]    Report Status PENDING  Incomplete         Radiology Studies: Dg Abd Portable 1v  Result Date: 11/22/2016 CLINICAL DATA:  Follow-up ileus. EXAM: PORTABLE ABDOMEN - 1 VIEW COMPARISON:  CT 11/20/2016. FINDINGS: Right femoral catheter noted. Tip is projected over the region of the right mid IVC. Soft tissue structures are unremarkable. No bowel distention. Stool in the rectum. Left iliac vascular stent noted. Aortoiliac atherosclerotic vascular calcification present. IMPRESSION: 1. No evidence of bowel distention.  Stool is in the rectum . 2. Right central catheter, presumably right femoral venous catheter noted with its tip projected over the IVC at the level of L2. Left iliac vascular stent noted. 3.  Aortoiliac atherosclerotic vascular disease. Electronically Signed   By: Maisie Fus  Register   On: 11/22/2016 13:22      Scheduled Meds: .  ceFAZolin (ANCEF) IV  1 g Intravenous Q8H  . famotidine  20 mg Oral BID  . feeding supplement (ENSURE  ENLIVE)  237 mL Oral TID WC  . hydrocortisone   Rectal TID  . multivitamin with minerals  1 tablet Oral Daily  . phosphorus  500 mg Oral TID  . sodium chloride flush  3 mL Intravenous Q12H   Continuous Infusions:    LOS: 3 days    Time spent in minutes: 35    Lizza Huffaker, MD [facility] Pager: [email or web address] Password [facility] 11/23/2016, 12:02 PM

## 2016-11-23 NOTE — Progress Notes (Signed)
CRITICAL VALUE ALERT  Critical value received:  Lactic:7.5  Date of notification:  11/23/16  Time of notification:  0314  Critical value read back:Yes.    Nurse who received alert:  Porfirio Oar RN  MD notified (1st page):  Craige Cotta  Time of first page:  [phone]

## 2016-11-23 NOTE — Progress Notes (Signed)
CSW assisting with d/c planning. Pt / son, Fayrene Fearing,  are in agreement with d/c to [facility] today. [facility] transport is required. Medical necessity form completed. D/C Summary sent to [facility] for review. No scripts needed. # for report provided to nsg.  Cori Razor LCSW [phone]

## 2016-11-23 NOTE — Consult Note (Signed)
Consultation Note Date: 11/23/2016   Patient Name: [name]  DOB: 1961-04-14  MRN: [id number]  Age / Sex: 56 y.o., female  PCP: Wenda Low, MD Referring Physician: Debbe Odea, MD  Reason for Consultation: Disposition and Establishing goals of care  HPI/Patient Profile: 56 y.o. female  with past medical history of FTT, EtOH/polysubstance abuse, hemorrhoids with bleeding, HTN, A. Fib, Dm2 with peripheral neuropathy, chronic transaminitis, hypothyroid, chronic pain, PE and LLE on Eliquis, anemia of chronic disease. She has had multiple admissions in the past six months with clear failure to thrive issues. She presented to the ED most recently with weakness and minimal PO intake. She was subsequently admitted on 11/20/2016 with severe lab abnormalities, hypothermia, anemia, and sepsis from Klebsiella UTI. Palliative consulted given her progressive decline with failure to thrive.   Clinical Assessment and Goals of Care: I met with [name] (the patient's son and next of kin) outside of the patient's room. He was accompanied by [name], who has been the patient's caregiver for the past seven years. They relate a steady decline in health over the past few months. They have been trying to encourage her to eat, encourage medication compliance, and have been diligently bringing her to the hospital when she declines. [name] was tearful as he explained that he has been trying to keep her going, but knows that she is likely now at the end because she is no longer eating or drinking enough to sustain life. He struggles with accepting that this is likely the end of her life, but is focused on ensuring her comfort and dignity for whatever time she has left.   I explained that her nutritional status/oral intake was so poor I did not expect she would have very long once we stop the IV fluid and electrolyte supplements. Additionally, her anxiety and pain need to be consistently  monitored and effectively managed. In order to provide adequate care that ensures symptom management, full time support, and expected quick deterioration, I recommended residential hospice. I explained to them the hospice philosophy and described the facility. [name] agreed it would be the best option and wished to pursue it.   Finally, I brought up code status. I reinforced the importance of maintaining comfort and dignity, and explained the plan to provide medication and support for when her death approached. This would mean not pushing on her chest, shocking her heart with electricity, or putting her on a breathing machine. While these things may keep her alive for a little longer, they would be painful, cause prolonged suffering, and wouldn't change the fact that she isn't eating or drinking. After thinking about for a while, [name] did agree with DNR status.   **Of note, [name] is non-decisional. She is fixated on being at home, but has no insight into her decline or the degree of care she requires. In my conversation with her I focused on the importance of managing her pain and anxiety. I explained that [name] a place where they specialized in supporting people, managing their pain, and helping them feel as good as possible. She was accepting of this, but reinforced that she wanted to be at home as soon as possible. Given her repeat infections (and current one) and no oral intake with severe electrolyte abnormalities, I would expect she has days to weeks left.   Primary Decision Maker NEXT OF KIN, son [name].   SUMMARY OF RECOMMENDATIONS    DNR, comfort care  Continue abx until time of discharge  No further labs. VS qshift.   Medication's adjusted  Fever, mild pain: Tylenol  Anxiety: Xanax, Haldol (has used Xanax previously, avoid Ativan given paradoxical reaction)  Pain: Dilaudid   Nausea: Zofran  Hemorrhoids: Anusol-HC  Secretions: Robinul  Code Status/Advance Care  Planning:  DNR  Additional Recommendations (Limitations, Scope, Preferences):  Full Comfort Care; would continue abx until discharge.  Psycho-social/Spiritual:   Desire for further Chaplaincy support:no  Additional Recommendations: Education on Hospice  Prognosis:   Days to weeks  Discharge Planning: Hospice facility      Primary Diagnoses: Present on Admission: . Metabolic acidosis . Shock ([place]) . Abdominal pain . AKI (acute kidney injury) ([place]) . Anemia . Chronic pain . Dehydration . Elevated lactic acid level . History of pulmonary embolus (PE) . Hypothyroidism . Sacral decubitus ulcer, stage II . Severe protein-calorie malnutrition ([place]) . Thrombocytopenia ([place])   I have reviewed the medical record, interviewed the patient and family, and examined the patient. The following aspects are pertinent.  Past Medical History:  Diagnosis Date  . Anxiety   . Arthritis    "a little bit; all over" (05/01/2016)  . Asthma   . Asthma   . C. difficile colitis   . Chronic lower back pain   . Crohn disease ([place])   . Diabetes mellitus without complication ([place])   . DVT (deep venous thrombosis) ([place]) ~ 03/2016   LLE  . Elevated lactic acid level   . GERD (gastroesophageal reflux disease)   . GI bleed 10/18/2016  . Glaucoma   . HLD (hyperlipidemia)   . Hypertension   . Hypothyroidism   . Neuropathy ([place])   . Pancreatitis   . Pulmonary embolus ([place]) 05/24/2014  . Thrombocytopenia ([place])   . Tobacco abuse    Social History   Social History  . Marital status: Divorced    Spouse name: N/A  . Number of children: N/A  . Years of education: N/A   Social History Main Topics  . Smoking status: Current Every Day Smoker    Packs/day: 0.25    Years: 37.00    Types: Cigarettes  . Smokeless tobacco: Never Used  . Alcohol use No  . Drug use: No  . Sexual activity: No   Other Topics Concern  . None   Social History Narrative  . None   Family History  Problem  Relation Age of Onset  . Breast cancer Mother   . Heart disease Mother 21    CABG  . Diabetes Mother   . Heart disease Father 73    CABG  . Diabetes Father    Scheduled Meds: .  ceFAZolin (ANCEF) IV  1 g Intravenous Q8H  . Chlorhexidine Gluconate Cloth  6 each Topical Daily  . famotidine  20 mg Oral BID  . feeding supplement (ENSURE ENLIVE)  237 mL Oral TID WC  . hydrocortisone   Rectal TID  . multivitamin with minerals  1 tablet Oral Daily  . phosphorus  500 mg Oral TID  . sodium chloride flush  3 mL Intravenous Q12H   Continuous Infusions: PRN Meds:.sodium chloride, acetaminophen **OR** acetaminophen, HYDROmorphone (DILAUDID) injection, ondansetron **OR** ondansetron (ZOFRAN) IV, sodium chloride flush Allergies  Allergen Reactions  . Iohexol Other (See Comments)    "severe burning" Patient has received Contrast in 2005 with 13 hour pre-medication, and had no reaction at that time  . Spiriva Handihaler [Tiotropium Bromide Monohydrate] Nausea And Vomiting  . Iodinated Diagnostic Agents Other (See Comments)    Feels  like she is "on fire" for months afterwards, if exposed  . Ativan [Lorazepam] Other (See Comments)    Hallucinations/confusion/"swatting flies"  . Penicillins Hives    Has had cephalosporins Has patient had a PCN reaction causing immediate rash, facial/tongue/throat swelling, SOB or lightheadedness with hypotension: Yes Has patient had a PCN reaction causing severe rash involving mucus membranes or skin necrosis: Yes Has patient had a PCN reaction that required hospitalization: Yes Has patient had a PCN reaction occurring within the last 10 years: No If all of the above answers are "NO", then may proceed with Cephalosporin use.   Review of Systems: -Pain in back, sides, hips, and upper legs -No nausea -High anxiety -No appetite   * Difficult to focus the patient to answer questions.  Physical Exam  Constitutional: She appears cachectic. She is active. She has  a sickly appearance.  Eyes: EOM are normal.  Neck: Normal range of motion.  Pulmonary/Chest:  Mild SOB with prolonged conversation  Neurological: She is alert.  Oriented to self and place. Confused on time and situation. Poor insight into self-care and health status.  Skin: Skin is warm and dry. There is pallor.  Psychiatric: Her mood appears anxious. Her speech is rapid and/or pressured. She is agitated. She expresses impulsivity and inappropriate judgment.   *Limited as pt did not want to be touched or examined.  Vital Signs: BP 112/76   Pulse (!) 127   Temp 99.9 F (37.7 C)   Resp (!) 31   Ht 5' 5"  (1.651 m)   Wt 41.5 kg (91 lb 7.9 oz)   LMP 10/01/2010 Comment: Has depro  SpO2 96%   BMI 15.22 kg/m  Pain Assessment: CPOT   Pain Score: Asleep  SpO2: SpO2: 96 % O2 Device:SpO2: 96 % O2 Flow Rate: .O2 Flow Rate (L/min): 5 L/min  IO: Intake/output summary:  Intake/Output Summary (Last 24 hours) at 11/23/16 1107 Last data filed at 11/23/16 1019  Gross per 24 hour  Intake              100 ml  Output              680 ml  Net             -580 ml    LBM: Last BM Date: 11/22/16 Baseline Weight: Weight: 34.4 kg (75 lb 13.4 oz) Most recent weight: Weight: 41.5 kg (91 lb 7.9 oz)     Palliative Assessment/Data: PPS 20%   Flowsheet Rows   Flowsheet Row Most Recent Value  Intake Tab  Referral Department  Hospitalist  Unit at Time of Referral  ICU  Palliative Care Primary Diagnosis  Pulmonary  Date Notified  11/22/16  Palliative Care Type  New Palliative care  Reason for referral  Clarify Goals of Care  Date of Admission  11/20/16  # of days IP prior to Palliative referral  2  Clinical Assessment  Psychosocial & Spiritual Assessment  Palliative Care Outcomes     Time Total: 70 minutes Greater than 50%  of this time was spent counseling and coordinating care related to the above assessment and plan.  Signed by: Charlynn Court, NP Palliative Medicine Team Pager #  [phone] (M-F 7a-5p) Team Phone # [phone] (Nights/Weekends)

## 2016-11-23 NOTE — Discharge Summary (Signed)
Physician Discharge Summary  [name] ZOX:[id number] DOB: 1961-04-18 DOA: 11/20/2016  PCP: Georgann Housekeeper, MD  Admit date: 11/20/2016 Discharge date: 11/23/2016  Admitted From: home Disposition:  [facility]  Discharge Condition:  stable   CODE STATUS:  DNR   Diet recommendation:  Regular diet Consultations:  Palliative care    Discharge Diagnoses:  Principal Problem:   Severe sepsis (HCC) Active Problems: UTI   Metabolic acidosis   Anemia   History of pulmonary embolus (PE)   Dehydration   Elevated lactic acid level   Severe protein-calorie malnutrition (HCC)   Chronic pain   Thrombocytopenia (HCC)   Hypothyroidism   Abdominal pain   AKI (acute kidney injury) (HCC)   Sacral decubitus ulcer, stage II   Shock (HCC)   Lactic acidosis   Palliative care by specialist   Goals of care, counseling/discussion    Brief Narrative:  56 y/o female with 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 She was admitted 2/6 through 2/7 with a bleeding hemorrhoid and malnutrition. She has been admitted to the hospital about 8 times since April of last year.   ED notes mention that she presents this time with generalized pain. Per family, she has had a poor appetite for 2 wks after she was last discharged from the hospital on 2/7. She has a caregiver and also lives with her son. They have been trying to find a nursing home for her. Per caregiver, she does not take medications as she needs to.    Patient does not ambulate and essentially lays in bed in a diaper. She is intermittently confused and history provided by her is unreliable. Her son states that an Aid comes in for 3-4 hrs in the AM and cleans her in the day and he cleans her at night however the patient states that she lays in stool until aid comes back in the  morning. Son states they watch her take her medications BID. Patient is convinced she "broke both" her legs about 2-8 months ago.     In ER >> HR 120-140s, Temp 96.1, RR 42, pH 7.282, pCO2 too low to report, CO2 11, Anion gap 22, Sodium 151, K 3.0, Lactic acid and Pro calcitonin both 9.31  Hb 7.5 dropping to 5.4 with hydration  CT head 2/20 > nonacute CT abd/pelv 2/20 > Mild diffuse distention of small bowel and severe gastric distention may represent obstruction or ileus. Collapsed terminal ileum and colon from ascending segment to a sigmoid suggesting possible transition point of the ileum in the right lower quadrant. Large volume of stool in rectum. Stable 6 mm nodule within the left lower lobe of the lung. Enlarged common bile duct measuring up to 13 mm, previously 15 mm. Bilateral nonobstructing kidney stones  Subjective: Complains of pain due to hemorrhoids and nausea.   Assessment & Plan:   Principal Problem: Lactic acidosis, anion gap, tachycardia, tachypnea, K pneumoniae UTI - sepsis - despite improvement in pH and Bicarb, Lactic acidosis not resolved - she appears well hydrated now and is 8 L + balance - bicarb drip stopped 2/21 as CO2 normalized - Vanc and Maxipime started on admission - UA + for K pneumoniae - same organism grew out in 2/7 but she was asymptomatic- at this time, will continue to treat due to other symptoms of SIRS - - narrowed to Ancef today  based on sensitivities - had E faecalis UTI in 11/17 - laying in her diaper and non-obstructive renal stones likely contributing to UTIs- - no other urinary tract abnormality on CT - blood cultures negative   Severe malnutrition, hypoalbuminemia - albumin 1.6 - malnutrition has been an issue for many months now (has been documented in noted for close to 1 yr) - weight has dropped by at least 10 kg from 11/7 based on weights in our computer system - we are continuing IV hydration as, without it, she would quickly  become dehydrated - Hospice has been consulted for this reason- she is now comfort care and will be going to [facility].  Dehydration, AKI - baseline Cr 0.5- 0.8- up to 1.22 on admission - has trended down to 0.63 with volume replacement  Hypomagnesemia, hypokalemia, hypophosphatemia - due to malnutrition - replacing  Hypernatremia - changed to D5 W  Ileus? - CT suggesting possible ileus on 2/20 - nauseated 2/22 therefore, checked abd xray- no ileus present  Hepatic steatosis/ Transaminitis - steatosis noted on multiple imaging - transaminitis noted to be improving since admission and may be due to sepsis  Chronic pain - per med rec she is on Dilaudid 8 mg Q 4 hrs PRN- per son, she takes this BID at home-  - pain in her hemorrhoid and pain in all bony prominences    Hemorrhoid - pain and bleeding - on going issues since at least Jan- currently has blood in rectal area around hemorrhoid which would explain her anemia on admission  - Anusol cream started  Anemia - due to slow blood loss and due to acute illness - Hb of 5.4 after hydration on 2/20 - given 2 U PRBC and 2 U FFP as she is on Eliquis - haptoglobin normal- not hemolyzing     Ref. Range 11/20/2016 23:20  Iron Latest Ref Range: 28 - 170 ug/dL 52  UIBC Latest Units: ug/dL NOT CALCULATED  TIBC Latest Ref Range: 250 - 450 ug/dL NOT CALCULATED  Saturation Ratios Latest Ref Range: 10.4 - 31.8 % NOT CALCULATED  Ferritin Latest Ref Range: 11 - 307 ng/mL 326 (H)  Folate Latest Ref Range: >5.9 ng/mL 9.2      Thrombocytopenia   - due to sepsis vs blood loss- follow- holding Eliquis    Hypothyroidism - TSH normal on 1/20  Sacral decubitus ulcer, stage II - nursing care  H/o DVT/PE, May Thurner syndrome - D dimer normal on 2/20- off Eliquis for now   FTT - HIV, RPR-  non- reactive    Discharge Instructions  Discharge Instructions    Diet general    Complete by:  As directed      Allergies  as of 11/23/2016      Reactions   Iohexol Other (See Comments)   "severe burning" Patient has received Contrast in 2005 with 13 hour pre-medication, and had no reaction at that time   Spiriva Handihaler [tiotropium Bromide Monohydrate] Nausea And Vomiting   Iodinated Diagnostic Agents Other (See Comments)   Feels like she is "on fire" for months afterwards, if exposed   Ativan [lorazepam] Other (See Comments)   Hallucinations/confusion/"swatting flies"   Penicillins Hives   Has had cephalosporins Has patient had a PCN reaction causing immediate rash, facial/tongue/throat swelling, SOB or lightheadedness with hypotension: Yes Has patient had a PCN reaction causing severe rash involving mucus membranes or skin necrosis: Yes Has patient had a PCN reaction that required hospitalization: Yes Has patient had  a PCN reaction occurring within the last 10 years: No If all of the above answers are "NO", then may proceed with Cephalosporin use.      Medication List    STOP taking these medications   apixaban 5 MG Tabs tablet Commonly known as:  ELIQUIS   calcium carbonate 1250 (500 Ca) MG tablet Commonly known as:  OS-CAL - dosed in mg of elemental calcium   feeding supplement (ENSURE ENLIVE) Liqd   HYDROmorphone 4 MG tablet Commonly known as:  DILAUDID   levothyroxine 25 MCG tablet Commonly known as:  SYNTHROID, LEVOTHROID   magnesium oxide 400 MG tablet Commonly known as:  MAG-OX   potassium chloride 10 MEQ CR capsule Commonly known as:  MICRO-K   thiamine 100 MG tablet Commonly known as:  VITAMIN B-1   traZODone 25 mg Tabs tablet Commonly known as:  DESYREL     TAKE these medications   hydrocortisone 2.5 % rectal cream Commonly known as:  ANUSOL-HC Apply topically 4 (four) times daily. What changed:  how much to take   venlafaxine 25 MG tablet Commonly known as:  EFFEXOR Take 1 tablet (25 mg total) by mouth 2 (two) times daily.       Allergies  Allergen Reactions   . Iohexol Other (See Comments)    "severe burning" Patient has received Contrast in 2005 with 13 hour pre-medication, and had no reaction at that time  . Spiriva Handihaler [Tiotropium Bromide Monohydrate] Nausea And Vomiting  . Iodinated Diagnostic Agents Other (See Comments)    Feels like she is "on fire" for months afterwards, if exposed  . Ativan [Lorazepam] Other (See Comments)    Hallucinations/confusion/"swatting flies"  . Penicillins Hives    Has had cephalosporins Has patient had a PCN reaction causing immediate rash, facial/tongue/throat swelling, SOB or lightheadedness with hypotension: Yes Has patient had a PCN reaction causing severe rash involving mucus membranes or skin necrosis: Yes Has patient had a PCN reaction that required hospitalization: Yes Has patient had a PCN reaction occurring within the last 10 years: No If all of the above answers are "NO", then may proceed with Cephalosporin use.     Procedures/Studies:   Ct Abdomen Pelvis Wo Contrast  Result Date: 11/20/2016 CLINICAL DATA:  56 y/o F; generalized abdominal pain. Failure to thrive at home. EXAM: CT ABDOMEN AND PELVIS WITHOUT CONTRAST TECHNIQUE: Multidetector CT imaging of the abdomen and pelvis was performed following the standard protocol without IV contrast. COMPARISON:  05/15/2016 CT of abdomen and pelvis. FINDINGS: Lower chest: Stable 6 mm left lower lobe nodule (series 4, image 31). Moderate coronary artery calcification. Hepatobiliary: Hepatic steatosis. Normal gallbladder. Stable lesion within the dome of the liver with central fluid attenuation, probably a cyst, measuring up to 25 mm. Enlarged common bile duct measuring up to 13 mm, previously 15 mm. Pancreas: Unremarkable. No pancreatic ductal dilatation or surrounding inflammatory changes. Spleen: Normal in size without focal abnormality. Adrenals/Urinary Tract: Bilateral kidney stones. No hydronephrosis. Mildly distended bladder. Stomach/Bowel: Mild  diffuse distention of small bowel with collapsed terminal ileum and the right lower quadrant and largely collapsed colon except for large volume of stool in the rectum. Normal appendix. Retained contrast within the colon. Vascular/Lymphatic: Aortic atherosclerosis with severe calcification. Left common iliac vein stent. No enlarged abdominal or pelvic lymph nodes. Reproductive: Uterus and bilateral adnexa are unremarkable. Other: No abdominal wall hernia or abnormality. No abdominopelvic ascites. Musculoskeletal: No acute or significant osseous findings. IMPRESSION: 1. Mild diffuse distention of small bowel  and severe gastric distention may represent obstruction or ileus. Collapsed terminal ileum and colon from ascending segment to a sigmoid suggesting possible transition point of the ileum in the right lower quadrant. 2. Large volume of stool in rectum. 3. Stable 6 mm nodule within the left lower lobe of the lung. Follow-up per prior CT abdomen and pelvis study recommendations. 4. Enlarged common bile duct measuring up to 13 mm, previously 15 mm. 5. Hepatic steatosis. 6. Aortic atherosclerosis. 7. Bilateral nonobstructing kidney stones. Electronically Signed   By: Mitzi Hansen M.D.   On: 11/20/2016 16:49   Dg Chest 2 View  Result Date: 11/20/2016 CLINICAL DATA:  Shortness of breath.  Pain . EXAM: CHEST  2 VIEW COMPARISON:  11/06/2016. FINDINGS: Mediastinum and hilar structures are normal. Heart size normal. No focal infiltrate. No pleural effusion or pneumothorax. Deformity noted of the right posterior sixth rib consistent with old fracture. Rounded soft tissue density noted over the left upper abdomen most likely stool within colon or debris within the stomach. IMPRESSION: No acute cardiopulmonary disease. Electronically Signed   By: Maisie Fus  Register   On: 11/20/2016 14:56   Dg Chest 2 View  Result Date: 11/06/2016 CLINICAL DATA:  56 year old with current history of diabetes, hypertension,  hyperlipidemia, hypothyroidism and atrial fibrillation, presenting with fatigue and generalized malaise. EXAM: CHEST  2 VIEW COMPARISON:  09/17/2016, 08/11/2016 and earlier. FINDINGS: AP semi-erect and lateral images were obtained. Cardiac silhouette normal in size, unchanged. Thoracic aorta mildly tortuous and atherosclerotic. Hilar and mediastinal contours otherwise unremarkable. Lungs clear. Bronchovascular markings normal. Pulmonary vascularity normal. Possible left pleural effusion layering posteriorly on the lateral image. Slight thoracolumbar dextroscoliosis. IMPRESSION: 1. Possible left pleural effusion. No acute cardiopulmonary disease otherwise. 2. Mild thoracic aortic atherosclerosis. Electronically Signed   By: Hulan Saas M.D.   On: 11/06/2016 16:07   Ct Head Wo Contrast  Result Date: 11/20/2016 CLINICAL DATA:  Pt from home with complaints of generalized pain. Pt is normally in pain at her baseline. Pt is here for failure to thrive from home. Pt lives with many family members. Pt was recently hospitalized for hypoglycemia. Pt cbg for EMS was 66 Pt is tachycardic at this time. Per family pt has had poor appetite for 2 weeks. Pt denies CP. Pt has no cardiac history Pt sates she is having a hard time breathing, but her oxygen saturation is at 100% RA EXAM: CT HEAD WITHOUT CONTRAST TECHNIQUE: Contiguous axial images were obtained from the base of the skull through the vertex without intravenous contrast. COMPARISON:  08/12/2016 FINDINGS: Brain: No evidence of acute infarction, hemorrhage, hydrocephalus, extra-axial collection or mass lesion/mass effect. The ventricles and sulci are enlarged reflecting mild diffuse atrophy, stable from prior exam. There is mild periventricular white matter hypoattenuation consistent with chronic microvascular ischemic change, also stable. Vascular: No hyperdense vessel or unexpected calcification. Skull: Normal. Negative for fracture or focal lesion. Sinuses/Orbits:  Visualize globes and orbits are unremarkable. Visualized sinuses and mastoid air cells are clear. Other: None. IMPRESSION: 1. No acute intracranial abnormalities. 2. Mild atrophy and chronic microvascular ischemic change, stable from the prior study. Electronically Signed   By: Amie Portland M.D.   On: 11/20/2016 16:36   Dg Chest Port 1 View  Result Date: 11/21/2016 CLINICAL DATA:  56 year old female with dyspnea. Subsequent encounter. EXAM: PORTABLE CHEST 1 VIEW COMPARISON:  11/20/2016, 11/06/2016 and 03/29/2015. FINDINGS: Question nodule right midlung. No segmental consolidation, pulmonary edema or pneumothorax. Slightly tortuous aorta. Heart size within normal limits. Gas-filled stomach  and colon. No acute osseous abnormality. IMPRESSION: Question nodule mid right lung. This can be assessed on follow-up with cardiac leads removed. No infiltrate or congestive heart failure. Electronically Signed   By: Lacy Duverney M.D.   On: 11/21/2016 06:57   Dg Abd Portable 1v  Result Date: 11/22/2016 CLINICAL DATA:  Follow-up ileus. EXAM: PORTABLE ABDOMEN - 1 VIEW COMPARISON:  CT 11/20/2016. FINDINGS: Right femoral catheter noted. Tip is projected over the region of the right mid IVC. Soft tissue structures are unremarkable. No bowel distention. Stool in the rectum. Left iliac vascular stent noted. Aortoiliac atherosclerotic vascular calcification present. IMPRESSION: 1. No evidence of bowel distention.  Stool is in the rectum . 2. Right central catheter, presumably right femoral venous catheter noted with its tip projected over the IVC at the level of L2. Left iliac vascular stent noted. 3.  Aortoiliac atherosclerotic vascular disease. Electronically Signed   By: Maisie Fus  Register   On: 11/22/2016 13:22   US Abdomen Limited Ruq  Result Date: 11/06/2016 CLINICAL DATA:  Elevated LFTs EXAM: US ABDOMEN LIMITED - RIGHT UPPER QUADRANT COMPARISON:  10/05/2013, 05/15/2016 FINDINGS: Gallbladder: Gallbladder is well  distended. A single gallbladder polyp is again identified and stable. No wall thickening or pericholecystic fluid is noted. Common bile duct: Diameter: 11 mm. This is relatively stable from prior CT examination. Liver: Increased in echogenicity consistent with fatty infiltration. No focal lesion is noted. IMPRESSION: Common bile duct dilatation stable over multiple previous exams. Small gallbladder polyp. Fatty liver. Electronically Signed   By: Alcide Clever M.D.   On: 11/06/2016 20:33       Discharge Exam: Vitals:   11/23/16 1206 11/23/16 1450  BP: 117/86 120/80  Pulse: 95 90  Resp: 20 18  Temp: 98.5 F (36.9 C) 98 F (36.7 C)   Vitals:   11/23/16 1000 11/23/16 1100 11/23/16 1206 11/23/16 1450  BP: 112/76 115/76 117/86 120/80  Pulse:   95 90  Resp: (!) 31 20 20 18   Temp: 99.9 F (37.7 C) 99.4 F (37.4 C) 98.5 F (36.9 C) 98 F (36.7 C)  TempSrc:  Core (Comment) Oral Oral  SpO2: 96% 98% 96% 96%  Weight:      Height:        General: Pt is alert, awake, not in acute distress Cardiovascular: RRR, S1/S2 +, no rubs, no gallops Respiratory: CTA bilaterally, no wheezing, no rhonchi Abdominal: Soft, NT, ND, bowel sounds + Extremities: no edema, no cyanosis    The results of significant diagnostics from this hospitalization (including imaging, microbiology, ancillary and laboratory) are listed below for reference.     Microbiology: Recent Results (from the past 240 hour(s))  Urine culture     Status: Abnormal   Collection Time: 11/20/16  2:19 PM  Result Value Ref Range Status   Specimen Description URINE, CATHETERIZED  Final   Special Requests NONE  Final   Culture >=100,000 COLONIES/mL KLEBSIELLA PNEUMONIAE (A)  Final   Report Status 11/23/2016 FINAL  Final   Organism ID, Bacteria KLEBSIELLA PNEUMONIAE (A)  Final      Susceptibility   Klebsiella pneumoniae - MIC*    AMPICILLIN RESISTANT Resistant     CEFAZOLIN <=4 SENSITIVE Sensitive     CEFTRIAXONE <=1 SENSITIVE  Sensitive     CIPROFLOXACIN <=0.25 SENSITIVE Sensitive     GENTAMICIN <=1 SENSITIVE Sensitive     IMIPENEM <=0.25 SENSITIVE Sensitive     NITROFURANTOIN <=16 SENSITIVE Sensitive     TRIMETH/SULFA <=20 SENSITIVE Sensitive  AMPICILLIN/SULBACTAM 4 SENSITIVE Sensitive     PIP/TAZO <=4 SENSITIVE Sensitive     Extended ESBL NEGATIVE Sensitive     * >=100,000 COLONIES/mL KLEBSIELLA PNEUMONIAE  Blood Culture (routine x 2)     Status: None (Preliminary result)   Collection Time: 11/20/16  5:52 PM  Result Value Ref Range Status   Specimen Description BLOOD CENTRAL LINE  Final   Special Requests BOTTLES DRAWN AEROBIC AND ANAEROBIC 5CC  Final   Culture   Final    NO GROWTH 3 DAYS Performed at [facility], [address], [place], [place] [address]    Report Status PENDING  Incomplete  MRSA PCR Screening     Status: None   Collection Time: 11/20/16  9:43 PM  Result Value Ref Range Status   MRSA by PCR NEGATIVE NEGATIVE Final    Comment:        The GeneXpert MRSA Assay (FDA approved for NASAL specimens only), is one component of a comprehensive MRSA colonization surveillance program. It is not intended to diagnose MRSA infection nor to guide or monitor treatment for MRSA infections.   Blood Culture (routine x 2)     Status: None (Preliminary result)   Collection Time: 11/20/16 10:42 PM  Result Value Ref Range Status   Specimen Description BLOOD RIGHT THUMB  Final   Special Requests IN PEDIATRIC BOTTLE  Final   Culture   Final    NO GROWTH 2 DAYS Performed at [facility], [address] [place], [place] [address]    Report Status PENDING  Incomplete     Labs: BNP (last 3 results)  Recent Labs  11/06/16 1731  BNP 71.9   Basic Metabolic Panel:  Recent Labs Lab 11/21/16 1200 11/21/16 1540 11/21/16 2014 11/22/16 0535 11/22/16 1504 11/23/16 0445  NA 153* 152* 150* 150* 145 145  K 2.7* 2.4* 3.2* 3.3* 3.2* 3.2*  CL 117* 114* 109 107 100* 102  CO2 24  27 32 32 32 31  GLUCOSE 174* 143* 225* 171* 190* 98  BUN 10 10 9 9 8 8   CREATININE 0.74 0.69 0.69 0.63 0.55 0.62  CALCIUM 6.2* 6.2* 6.1* 6.0* 6.0* 6.0*  MG 1.1*  --   --  0.9*  --  2.1  PHOS 1.5*  --   --  <1.0*  --  <1.0*   Liver Function Tests:  Recent Labs Lab 11/20/16 1527 11/21/16 1200 11/22/16 0535  AST 239* 87* 84*  ALT 98* 54 51  ALKPHOS 129* 77 81  BILITOT 1.8* 1.5* 1.7*  PROT 4.1* 3.6* 3.5*  ALBUMIN 1.8* 1.7* 1.6*   No results for input(s): LIPASE, AMYLASE in the last 168 hours. No results for input(s): AMMONIA in the last 168 hours. CBC:  Recent Labs Lab 11/20/16 1527 11/20/16 1719 11/20/16 1841 11/20/16 2322 11/21/16 1200 11/22/16 1210  WBC 6.9 7.5 7.5  --  9.2 7.0  NEUTROABS 4.7 6.4 6.4  --  6.1  --   HGB 7.5* 7.0* 5.4*  --  9.3* 9.7*  HCT 24.7* 22.7* 16.8*  --  27.3* 28.8*  MCV 104.2* 102.3* 98.8  --  87.5 90.0  PLT 18* 151 96* 100* 71* 50*   Cardiac Enzymes:  Recent Labs Lab 11/20/16 1528 11/20/16 2328 11/21/16 1200  TROPONINI 0.12* 0.13* 0.23*   BNP: Invalid input(s): POCBNP CBG:  Recent Labs Lab 11/20/16 1503  GLUCAP 93   D-Dimer  Recent Labs  11/20/16 2322  DDIMER 0.39   Hgb  A1c No results for input(s): HGBA1C in the last 72 hours. Lipid Profile No results for input(s): CHOL, HDL, LDLCALC, TRIG, CHOLHDL, LDLDIRECT in the last 72 hours. Thyroid function studies No results for input(s): TSH, T4TOTAL, T3FREE, THYROIDAB in the last 72 hours.  Invalid input(s): FREET3 Anemia work up  Recent Labs  11/20/16 2320 11/20/16 2328  VITAMINB12 937*  --   FOLATE 9.2  --   FERRITIN 326*  --   TIBC NOT CALCULATED  --   IRON 52  --   RETICCTPCT  --  2.6   Urinalysis    Component Value Date/Time   COLORURINE AMBER (A) 11/20/2016 1419   APPEARANCEUR CLEAR 11/20/2016 1419   LABSPEC 1.025 11/20/2016 1419   PHURINE 5.0 11/20/2016 1419   GLUCOSEU NEGATIVE 11/20/2016 1419   HGBUR NEGATIVE 11/20/2016 1419   BILIRUBINUR NEGATIVE  11/20/2016 1419   KETONESUR 20 (A) 11/20/2016 1419   PROTEINUR NEGATIVE 11/20/2016 1419   UROBILINOGEN 1.0 02/28/2015 1227   NITRITE NEGATIVE 11/20/2016 1419   LEUKOCYTESUR MODERATE (A) 11/20/2016 1419   Sepsis Labs Invalid input(s): PROCALCITONIN,  WBC,  LACTICIDVEN Microbiology Recent Results (from the past 240 hour(s))  Urine culture     Status: Abnormal   Collection Time: 11/20/16  2:19 PM  Result Value Ref Range Status   Specimen Description URINE, CATHETERIZED  Final   Special Requests NONE  Final   Culture >=100,000 COLONIES/mL KLEBSIELLA PNEUMONIAE (A)  Final   Report Status 11/23/2016 FINAL  Final   Organism ID, Bacteria KLEBSIELLA PNEUMONIAE (A)  Final      Susceptibility   Klebsiella pneumoniae - MIC*    AMPICILLIN RESISTANT Resistant     CEFAZOLIN <=4 SENSITIVE Sensitive     CEFTRIAXONE <=1 SENSITIVE Sensitive     CIPROFLOXACIN <=0.25 SENSITIVE Sensitive     GENTAMICIN <=1 SENSITIVE Sensitive     IMIPENEM <=0.25 SENSITIVE Sensitive     NITROFURANTOIN <=16 SENSITIVE Sensitive     TRIMETH/SULFA <=20 SENSITIVE Sensitive     AMPICILLIN/SULBACTAM 4 SENSITIVE Sensitive     PIP/TAZO <=4 SENSITIVE Sensitive     Extended ESBL NEGATIVE Sensitive     * >=100,000 COLONIES/mL KLEBSIELLA PNEUMONIAE  Blood Culture (routine x 2)     Status: None (Preliminary result)   Collection Time: 11/20/16  5:52 PM  Result Value Ref Range Status   Specimen Description BLOOD CENTRAL LINE  Final   Special Requests BOTTLES DRAWN AEROBIC AND ANAEROBIC 5CC  Final   Culture   Final    NO GROWTH 3 DAYS Performed at [facility], [address] [place], [place] [address]    Report Status PENDING  Incomplete  MRSA PCR Screening     Status: None   Collection Time: 11/20/16  9:43 PM  Result Value Ref Range Status   MRSA by PCR NEGATIVE NEGATIVE Final    Comment:        The GeneXpert MRSA Assay (FDA approved for NASAL specimens only), is one component of a comprehensive MRSA  colonization surveillance program. It is not intended to diagnose MRSA infection nor to guide or monitor treatment for MRSA infections.   Blood Culture (routine x 2)     Status: None (Preliminary result)   Collection Time: 11/20/16 10:42 PM  Result Value Ref Range Status   Specimen Description BLOOD RIGHT THUMB  Final   Special Requests IN PEDIATRIC BOTTLE  Final   Culture   Final    NO GROWTH 2 DAYS Performed at [facility], [address], [place], [place] [address]    Report Status PENDING  Incomplete     Time coordinating discharge: Over 30 minutes  SIGNED:   Calvert Cantor, MD  [facility] 11/23/2016, 4:16 PM Pager   If 7PM-7AM, please contact night-coverage [email or web address] Password [email or web address]

## 2016-11-23 NOTE — Progress Notes (Signed)
Nutrition Brief Note  Chart reviewed. Pt was seen for full assessment by RD on 2/21. Palliative Care NP met with pt's son today and plan is for full comfort measures and to check on bed availability at [facility]..  No further nutrition interventions warranted at this time.  Please re-consult as needed.     Jarome Matin, MS, RD, LDN, [facility] Inpatient Clinical Dietitian Pager # [phone] After hours/weekend pager # [phone]

## 2016-11-23 NOTE — Progress Notes (Signed)
CSW spoke with Beverley Fiedler, NP, from Palliative Care Team following meeting with pt's son, Fayrene Fearing. CSW and Huntley Dec discussed if there was a need to file APS report on this pt. After long meeting with pt's son Huntley Dec  feels son has cared for pt to the best of his ability. Since pt will not be returning home, APS report will not be filed at this time. Huntley Dec is recommending residential hospice home placement and pt's son has chosen [facility]. CSW has contacted Kennyth Arnold, liaison, from [facility] and referral has been provided.  A decision is pending. CSW has left a VM for pt's son, Fayrene Fearing, to contact CSW. Will update Fayrene Fearing once he returns CSW call.  Cori Razor LCSW [phone]

## 2016-11-23 NOTE — Progress Notes (Signed)
[facility] Hospital Liaison  Received request from [place], CSW, for family interest in [place] with request for transfer today.  Chart currently under review.  Will advise CSW when eligibility confirmed.  Thank you for the referral.  Adele Barthel, RN, BSN  [facility] Liaison [phone]  All hospital liaison's are now on AMION.  Please feel free to call me at the above number or call [phone] after 5pm.

## 2016-11-24 LAB — RPR: RPR Ser Ql: NONREACTIVE

## 2016-11-25 LAB — CULTURE, BLOOD (ROUTINE X 2): CULTURE: NO GROWTH

## 2016-11-26 LAB — CULTURE, BLOOD (ROUTINE X 2): Culture: NO GROWTH

## 2018-02-03 IMAGING — CT CT ABD-PELV W/ CM
2 of 5 series · 15 of 46 positions shown, 17 images · IV contrast (APPLIED)
Comparison: 02/06/2016

CLINICAL DATA: Lactic acidosis.

EXAM:
CT ABDOMEN AND PELVIS WITH CONTRAST
TECHNIQUE: Multidetector CT imaging of the abdomen and pelvis was performed
using the standard protocol following bolus administration of
intravenous contrast.
CONTRAST:  1 [id number] IOPAMIDOL ([id number]) INJECTION 61%

[Series 2: abd/ pelvis 5.0 i30f 1 · axial · 0.66mm/px · z∈[+823,+1183]mm · 12 of 82 slices shown, 14 images]
[im 5/82  soft-tissue]
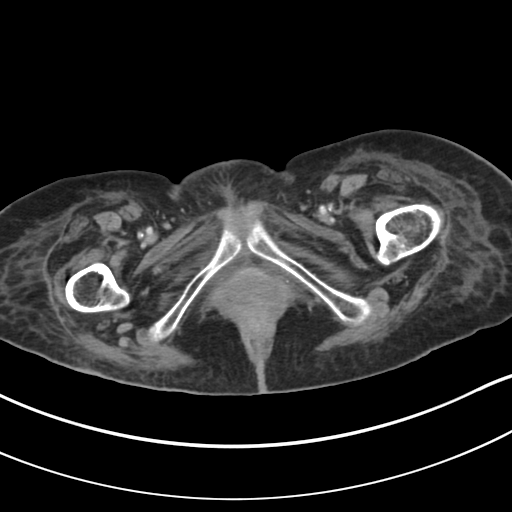
[im 5/82  bone]
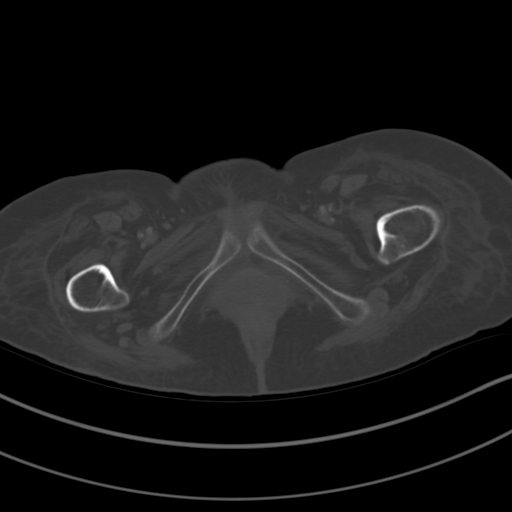
[im 13/82  soft-tissue]
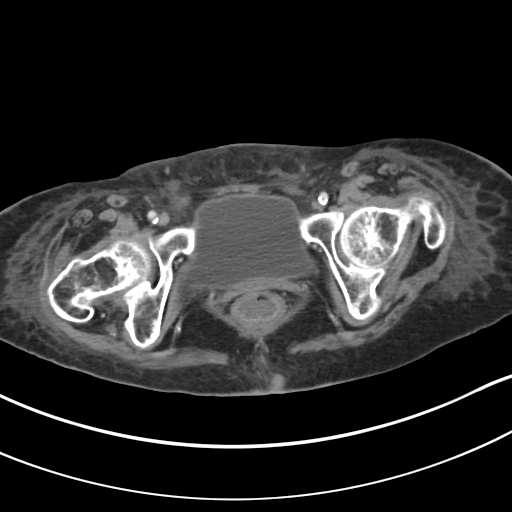
[im 18/82  soft-tissue]
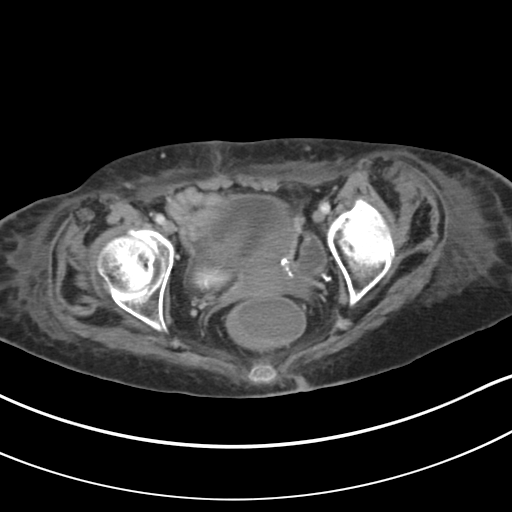
[im 26/82  soft-tissue]
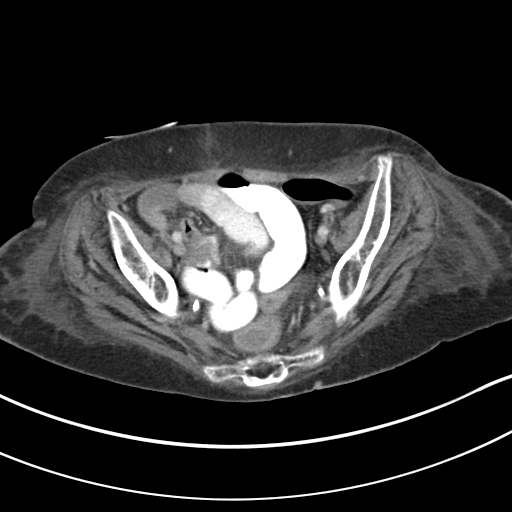
[im 30/82  soft-tissue]
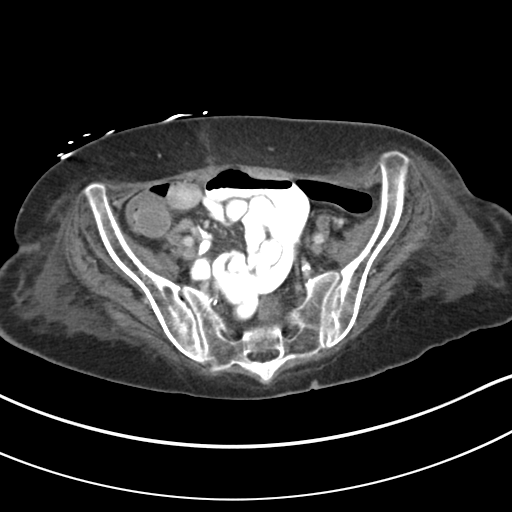
[im 39/82  soft-tissue]
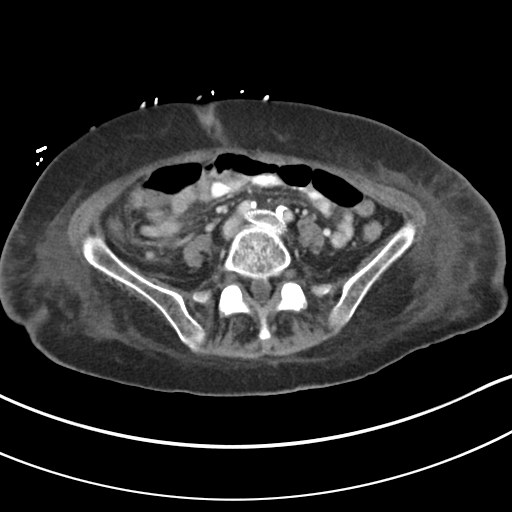
[im 43/82  soft-tissue]
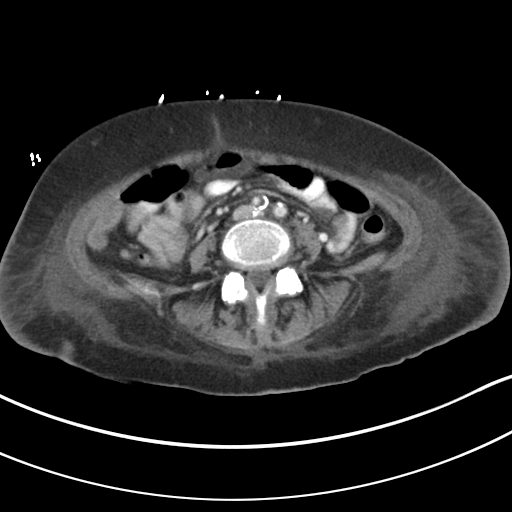
[im 52/82  soft-tissue]
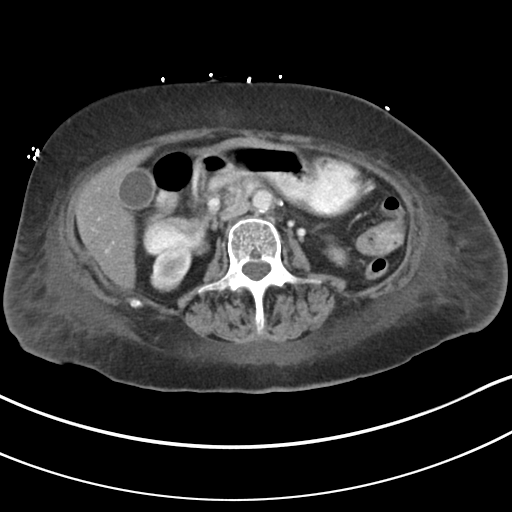
[im 56/82  soft-tissue]
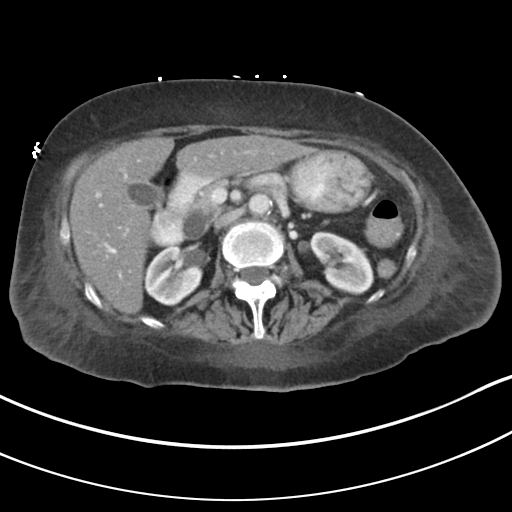
[im 56/82  bone]
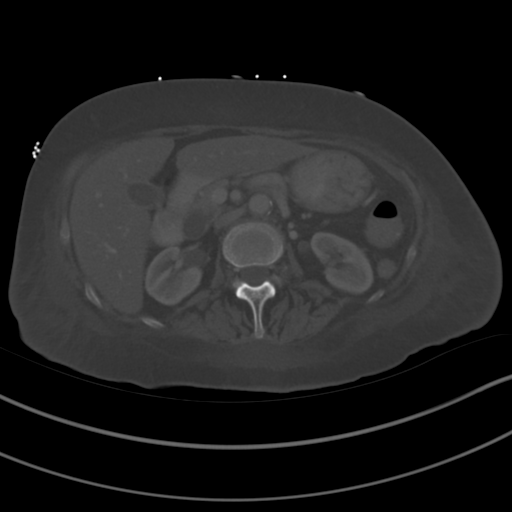
[im 64/82  soft-tissue]
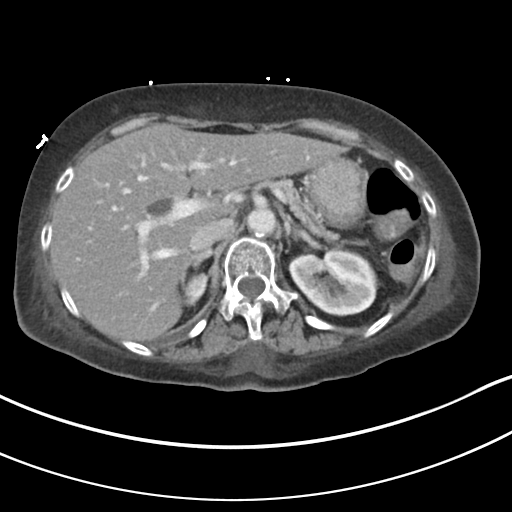
[im 69/82  soft-tissue]
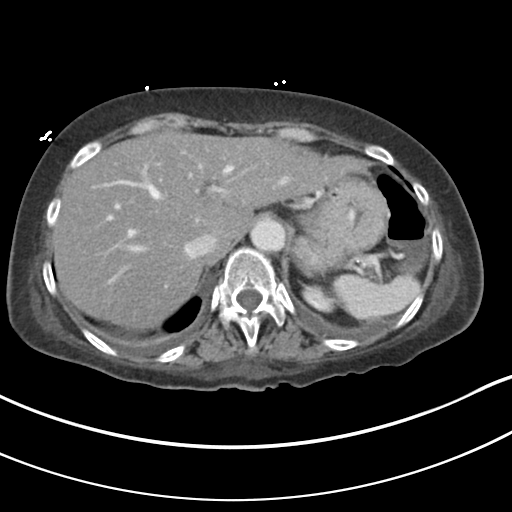
[im 77/82  soft-tissue]
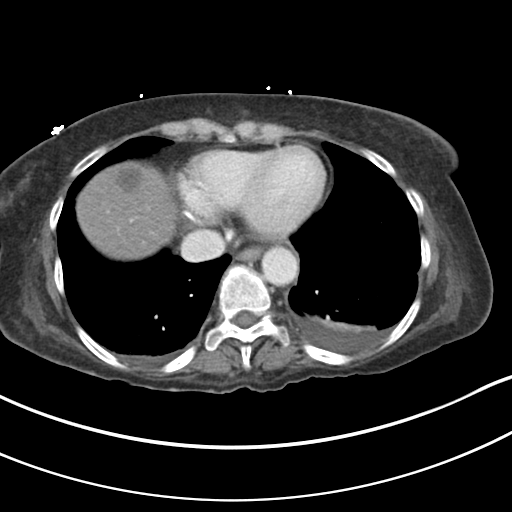

[Series 5: coronal soft tissue · coronal · 0.66mm/px · 3 of 68 slices shown]
[im 23/68  soft-tissue]
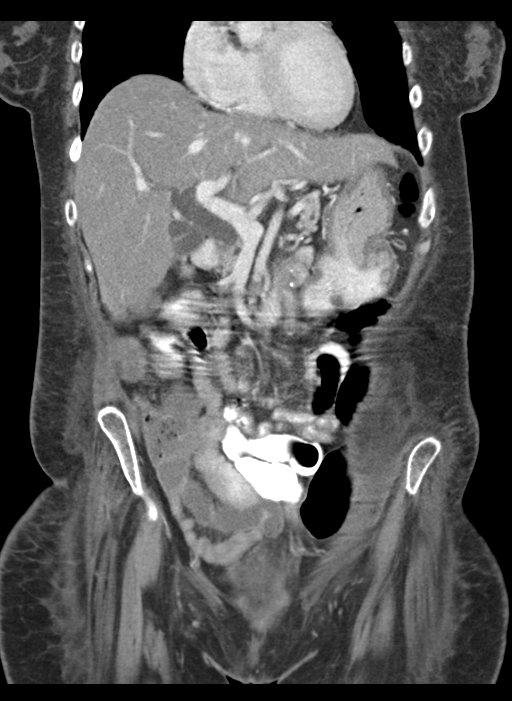
[im 30/68  soft-tissue]
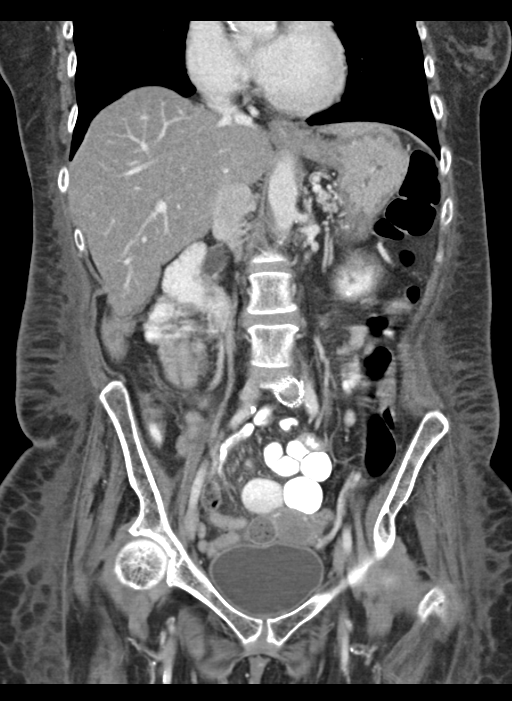
[im 38/68  soft-tissue]
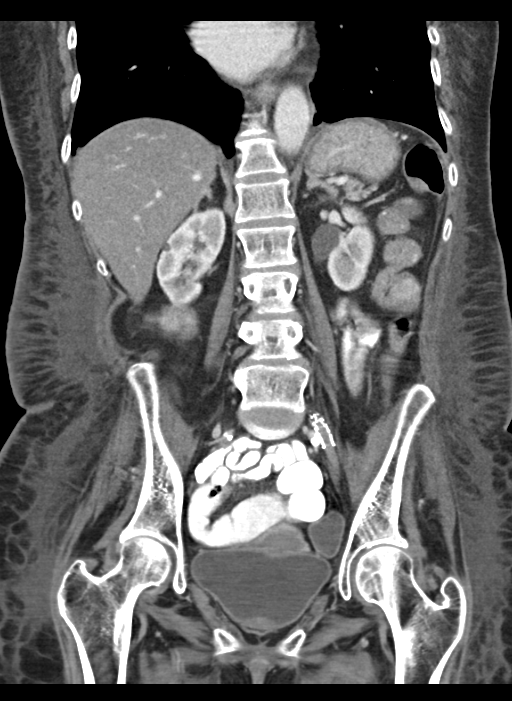

[15 of 46 positions shown; findings below may reference images not displayed]

FINDINGS: Lower chest: Bilateral pleural effusions are identified, left
greater than right.

Hepatobiliary: Hepatic steatosis is noted. Cyst along the dome of
liver is again noted. Normal appearance of the gallbladder. Increase
caliber of the common bile duct measures 1.5 cm. No filling defects
identified.

Pancreas: No mass, inflammatory changes, or other significant
abnormality.

Spleen: Within normal limits in size and appearance.

Adrenals/Urinary Tract: The adrenal glands appear normal. Mild
bilateral renal atrophy. Bilateral extra renal pelvis is identified.
The urinary bladder appears normal.

Stomach/Bowel: The stomach appears normal. No pathologic dilatation
of the large or small bowel loops. The pelvic small bowel loops are
mildly increased in caliber measuring up to 2 cm. There is no
abnormal small bowel wall thickening or inflammation. No pneumatosis
or evidence of bowel perforation. No pathologic dilatation of the
colon. No evidence for colonic pneumatosis or perforation.

Vascular/Lymphatic: Aortic atherosclerosis noted. There is a
metallic stent within the left common iliac vein. No enlarged upper
abdominal lymph nodes. No enlarged pelvic or inguinal lymph nodes.

Reproductive: The uterus is either surgically absent or atrophic. No
adnexal mass identified.

Other: There is no ascites or focal fluid collections within the
abdomen or pelvis. Diffuse body wall edema identified.

Musculoskeletal:  No suspicious bone abnormality identified.
IMPRESSION: 1. No pathologic dilatation of the large or small bowel loops to
suggest bowel obstruction. Additionally, there is no evidence for
bowel perforation, abscess formation or pneumatosis.
2. Aortic atherosclerosis.
3. Hepatic steatosis.
4. Abnormal dilatation of the common bile duct which measures up to
15 mm.
5. Bilateral pleural effusions.
